# Patient Record
Sex: Male | Born: 1945
Health system: Southern US, Community
[De-identification: ages and names within clinical notes are randomized; demographics above are authoritative.]

## PROBLEM LIST (undated history)

## (undated) DIAGNOSIS — Z9889 Other specified postprocedural states: Secondary | ICD-10-CM

## (undated) DIAGNOSIS — Z860100 Personal history of colon polyps, unspecified: Secondary | ICD-10-CM

## (undated) DIAGNOSIS — K2270 Barrett's esophagus without dysplasia: Secondary | ICD-10-CM

## (undated) DIAGNOSIS — I779 Disorder of arteries and arterioles, unspecified: Secondary | ICD-10-CM

## (undated) DIAGNOSIS — I219 Acute myocardial infarction, unspecified: Secondary | ICD-10-CM

## (undated) DIAGNOSIS — T7840XA Allergy, unspecified, initial encounter: Secondary | ICD-10-CM

## (undated) DIAGNOSIS — G20A1 Parkinson's disease without dyskinesia, without mention of fluctuations: Secondary | ICD-10-CM

## (undated) DIAGNOSIS — J309 Allergic rhinitis, unspecified: Secondary | ICD-10-CM

## (undated) DIAGNOSIS — G459 Transient cerebral ischemic attack, unspecified: Secondary | ICD-10-CM

## (undated) DIAGNOSIS — I4510 Unspecified right bundle-branch block: Secondary | ICD-10-CM

## (undated) DIAGNOSIS — K5790 Diverticulosis of intestine, part unspecified, without perforation or abscess without bleeding: Secondary | ICD-10-CM

## (undated) DIAGNOSIS — R7301 Impaired fasting glucose: Secondary | ICD-10-CM

## (undated) DIAGNOSIS — R519 Headache, unspecified: Secondary | ICD-10-CM

## (undated) DIAGNOSIS — E785 Hyperlipidemia, unspecified: Secondary | ICD-10-CM

## (undated) DIAGNOSIS — I10 Essential (primary) hypertension: Secondary | ICD-10-CM

## (undated) DIAGNOSIS — I639 Cerebral infarction, unspecified: Secondary | ICD-10-CM

## (undated) DIAGNOSIS — R9430 Abnormal result of cardiovascular function study, unspecified: Secondary | ICD-10-CM

## (undated) DIAGNOSIS — K219 Gastro-esophageal reflux disease without esophagitis: Secondary | ICD-10-CM

## (undated) DIAGNOSIS — R51 Headache: Secondary | ICD-10-CM

## (undated) DIAGNOSIS — R251 Tremor, unspecified: Secondary | ICD-10-CM

## (undated) DIAGNOSIS — K602 Anal fissure, unspecified: Secondary | ICD-10-CM

## (undated) DIAGNOSIS — R002 Palpitations: Secondary | ICD-10-CM

## (undated) DIAGNOSIS — I739 Peripheral vascular disease, unspecified: Secondary | ICD-10-CM

## (undated) DIAGNOSIS — B029 Zoster without complications: Secondary | ICD-10-CM

## (undated) DIAGNOSIS — G4730 Sleep apnea, unspecified: Secondary | ICD-10-CM

## (undated) DIAGNOSIS — G20 Parkinson's disease: Secondary | ICD-10-CM

## (undated) DIAGNOSIS — H269 Unspecified cataract: Secondary | ICD-10-CM

## (undated) DIAGNOSIS — J189 Pneumonia, unspecified organism: Secondary | ICD-10-CM

## (undated) DIAGNOSIS — C61 Malignant neoplasm of prostate: Secondary | ICD-10-CM

## (undated) DIAGNOSIS — I2510 Atherosclerotic heart disease of native coronary artery without angina pectoris: Secondary | ICD-10-CM

## (undated) DIAGNOSIS — J45909 Unspecified asthma, uncomplicated: Secondary | ICD-10-CM

## (undated) DIAGNOSIS — G709 Myoneural disorder, unspecified: Secondary | ICD-10-CM

## (undated) DIAGNOSIS — R42 Dizziness and giddiness: Secondary | ICD-10-CM

## (undated) DIAGNOSIS — M1990 Unspecified osteoarthritis, unspecified site: Secondary | ICD-10-CM

## (undated) DIAGNOSIS — F419 Anxiety disorder, unspecified: Secondary | ICD-10-CM

## (undated) HISTORY — DX: Essential (primary) hypertension: I10

## (undated) HISTORY — DX: Unspecified right bundle-branch block: I45.10

## (undated) HISTORY — PX: ROTATOR CUFF REPAIR: SHX139

## (undated) HISTORY — PX: KNEE SURGERY: SHX244

## (undated) HISTORY — DX: Palpitations: R00.2

## (undated) HISTORY — PX: COLONOSCOPY: SHX174

## (undated) HISTORY — DX: Gastro-esophageal reflux disease without esophagitis: K21.9

## (undated) HISTORY — PX: BACK SURGERY: SHX140

## (undated) HISTORY — DX: Unspecified osteoarthritis, unspecified site: M19.90

## (undated) HISTORY — DX: Impaired fasting glucose: R73.01

## (undated) HISTORY — PX: UPPER GASTROINTESTINAL ENDOSCOPY: SHX188

## (undated) HISTORY — DX: Sleep apnea, unspecified: G47.30

## (undated) HISTORY — DX: Hyperlipidemia, unspecified: E78.5

## (undated) HISTORY — DX: Disorder of arteries and arterioles, unspecified: I77.9

## (undated) HISTORY — DX: Dizziness and giddiness: R42

## (undated) HISTORY — DX: Anal fissure, unspecified: K60.2

## (undated) HISTORY — PX: HERNIA REPAIR: SHX51

## (undated) HISTORY — DX: Unspecified asthma, uncomplicated: J45.909

## (undated) HISTORY — DX: Zoster without complications: B02.9

## (undated) HISTORY — DX: Tremor, unspecified: R25.1

## (undated) HISTORY — DX: Abnormal result of cardiovascular function study, unspecified: R94.30

## (undated) HISTORY — DX: Peripheral vascular disease, unspecified: I73.9

## (undated) HISTORY — DX: Diverticulosis of intestine, part unspecified, without perforation or abscess without bleeding: K57.90

## (undated) HISTORY — DX: Unspecified cataract: H26.9

## (undated) HISTORY — PX: POLYPECTOMY: SHX149

## (undated) HISTORY — DX: Cerebral infarction, unspecified: I63.9

## (undated) HISTORY — DX: Other specified postprocedural states: Z98.890

## (undated) HISTORY — DX: Personal history of colon polyps, unspecified: Z86.0100

## (undated) HISTORY — DX: Personal history of colonic polyps: Z86.010

## (undated) HISTORY — DX: Allergic rhinitis, unspecified: J30.9

## (undated) HISTORY — DX: Barrett's esophagus without dysplasia: K22.70

## (undated) HISTORY — DX: Anxiety disorder, unspecified: F41.9

## (undated) HISTORY — DX: Allergy, unspecified, initial encounter: T78.40XA

---

## 1997-12-16 ENCOUNTER — Encounter: Payer: Self-pay | Admitting: Emergency Medicine

## 1997-12-16 ENCOUNTER — Observation Stay (HOSPITAL_COMMUNITY): Admission: EM | Admit: 1997-12-16 | Discharge: 1997-12-17 | Payer: Self-pay | Admitting: Emergency Medicine

## 1999-04-02 ENCOUNTER — Ambulatory Visit (HOSPITAL_BASED_OUTPATIENT_CLINIC_OR_DEPARTMENT_OTHER): Admission: RE | Admit: 1999-04-02 | Discharge: 1999-04-02 | Payer: Self-pay | Admitting: Surgery

## 2000-01-13 HISTORY — PX: NECK SURGERY: SHX720

## 2000-07-06 ENCOUNTER — Encounter: Payer: Self-pay | Admitting: Family Medicine

## 2000-07-06 ENCOUNTER — Encounter: Admission: RE | Admit: 2000-07-06 | Discharge: 2000-07-06 | Payer: Self-pay | Admitting: Family Medicine

## 2000-12-14 ENCOUNTER — Encounter: Payer: Self-pay | Admitting: Neurosurgery

## 2000-12-17 ENCOUNTER — Encounter: Payer: Self-pay | Admitting: Neurosurgery

## 2000-12-17 ENCOUNTER — Ambulatory Visit (HOSPITAL_COMMUNITY): Admission: RE | Admit: 2000-12-17 | Discharge: 2000-12-18 | Payer: Self-pay | Admitting: Neurosurgery

## 2001-01-21 ENCOUNTER — Encounter: Payer: Self-pay | Admitting: Neurosurgery

## 2001-01-21 ENCOUNTER — Ambulatory Visit (HOSPITAL_COMMUNITY): Admission: RE | Admit: 2001-01-21 | Discharge: 2001-01-21 | Payer: Self-pay | Admitting: Neurosurgery

## 2001-02-17 ENCOUNTER — Encounter: Payer: Self-pay | Admitting: Neurosurgery

## 2001-02-17 ENCOUNTER — Ambulatory Visit (HOSPITAL_COMMUNITY): Admission: RE | Admit: 2001-02-17 | Discharge: 2001-02-17 | Payer: Self-pay | Admitting: Neurosurgery

## 2001-09-06 ENCOUNTER — Encounter: Payer: Self-pay | Admitting: Neurosurgery

## 2001-09-06 ENCOUNTER — Encounter: Admission: RE | Admit: 2001-09-06 | Discharge: 2001-09-06 | Payer: Self-pay | Admitting: Neurosurgery

## 2001-09-23 ENCOUNTER — Encounter: Payer: Self-pay | Admitting: Neurosurgery

## 2001-09-23 ENCOUNTER — Encounter: Admission: RE | Admit: 2001-09-23 | Discharge: 2001-09-23 | Payer: Self-pay | Admitting: Neurosurgery

## 2002-03-28 ENCOUNTER — Encounter
Admission: RE | Admit: 2002-03-28 | Discharge: 2002-05-10 | Payer: Self-pay | Admitting: Physical Medicine & Rehabilitation

## 2002-05-03 ENCOUNTER — Encounter
Admission: RE | Admit: 2002-05-03 | Discharge: 2002-05-03 | Payer: Self-pay | Admitting: Physical Medicine & Rehabilitation

## 2002-05-03 ENCOUNTER — Encounter: Payer: Self-pay | Admitting: Physical Medicine & Rehabilitation

## 2002-06-30 ENCOUNTER — Encounter: Payer: Self-pay | Admitting: Diagnostic Radiology

## 2002-06-30 ENCOUNTER — Encounter: Admission: RE | Admit: 2002-06-30 | Discharge: 2002-06-30 | Payer: Self-pay | Admitting: Family Medicine

## 2002-06-30 ENCOUNTER — Encounter: Payer: Self-pay | Admitting: Family Medicine

## 2002-07-14 ENCOUNTER — Encounter: Admission: RE | Admit: 2002-07-14 | Discharge: 2002-07-14 | Payer: Self-pay | Admitting: Neurosurgery

## 2002-07-14 ENCOUNTER — Encounter: Payer: Self-pay | Admitting: Neurosurgery

## 2002-10-18 ENCOUNTER — Encounter
Admission: RE | Admit: 2002-10-18 | Discharge: 2003-01-16 | Payer: Self-pay | Admitting: Physical Medicine & Rehabilitation

## 2003-02-16 ENCOUNTER — Encounter
Admission: RE | Admit: 2003-02-16 | Discharge: 2003-05-17 | Payer: Self-pay | Admitting: Physical Medicine & Rehabilitation

## 2003-05-16 ENCOUNTER — Encounter
Admission: RE | Admit: 2003-05-16 | Discharge: 2003-06-15 | Payer: Self-pay | Admitting: Physical Medicine & Rehabilitation

## 2003-06-07 ENCOUNTER — Encounter
Admission: RE | Admit: 2003-06-07 | Discharge: 2003-09-05 | Payer: Self-pay | Admitting: Physical Medicine & Rehabilitation

## 2003-11-06 ENCOUNTER — Encounter
Admission: RE | Admit: 2003-11-06 | Discharge: 2004-02-04 | Payer: Self-pay | Admitting: Physical Medicine & Rehabilitation

## 2003-11-08 ENCOUNTER — Ambulatory Visit: Payer: Self-pay | Admitting: Physical Medicine & Rehabilitation

## 2004-02-21 ENCOUNTER — Ambulatory Visit: Payer: Self-pay | Admitting: Cardiology

## 2004-05-09 ENCOUNTER — Ambulatory Visit: Payer: Self-pay | Admitting: Internal Medicine

## 2004-05-23 ENCOUNTER — Ambulatory Visit: Payer: Self-pay | Admitting: Internal Medicine

## 2004-06-25 ENCOUNTER — Encounter
Admission: RE | Admit: 2004-06-25 | Discharge: 2004-09-23 | Payer: Self-pay | Admitting: Physical Medicine & Rehabilitation

## 2004-06-25 ENCOUNTER — Ambulatory Visit: Payer: Self-pay | Admitting: Physical Medicine & Rehabilitation

## 2004-07-04 ENCOUNTER — Encounter
Admission: RE | Admit: 2004-07-04 | Discharge: 2004-07-04 | Payer: Self-pay | Admitting: Physical Medicine & Rehabilitation

## 2004-08-25 ENCOUNTER — Ambulatory Visit: Payer: Self-pay | Admitting: Physical Medicine & Rehabilitation

## 2004-10-15 ENCOUNTER — Encounter
Admission: RE | Admit: 2004-10-15 | Discharge: 2005-01-13 | Payer: Self-pay | Admitting: Physical Medicine & Rehabilitation

## 2005-06-02 ENCOUNTER — Encounter: Admission: RE | Admit: 2005-06-02 | Discharge: 2005-06-02 | Payer: Self-pay | Admitting: Neurosurgery

## 2005-08-25 ENCOUNTER — Ambulatory Visit: Payer: Self-pay | Admitting: Cardiology

## 2005-09-03 ENCOUNTER — Ambulatory Visit: Payer: Self-pay

## 2005-10-08 ENCOUNTER — Ambulatory Visit: Payer: Self-pay | Admitting: Cardiology

## 2006-09-21 ENCOUNTER — Encounter: Admission: RE | Admit: 2006-09-21 | Discharge: 2006-09-21 | Payer: Self-pay | Admitting: Neurosurgery

## 2006-10-26 ENCOUNTER — Ambulatory Visit: Payer: Self-pay | Admitting: Cardiology

## 2007-03-03 ENCOUNTER — Ambulatory Visit: Payer: Self-pay | Admitting: Physical Medicine & Rehabilitation

## 2007-03-04 ENCOUNTER — Encounter
Admission: RE | Admit: 2007-03-04 | Discharge: 2007-03-04 | Payer: Self-pay | Admitting: Physical Medicine & Rehabilitation

## 2007-03-05 ENCOUNTER — Encounter: Admission: RE | Admit: 2007-03-05 | Discharge: 2007-03-05 | Payer: Self-pay | Admitting: Neurosurgery

## 2007-06-13 ENCOUNTER — Ambulatory Visit: Payer: Self-pay | Admitting: Internal Medicine

## 2007-06-24 ENCOUNTER — Encounter: Payer: Self-pay | Admitting: Internal Medicine

## 2007-06-24 ENCOUNTER — Telehealth: Payer: Self-pay | Admitting: Internal Medicine

## 2007-06-27 ENCOUNTER — Ambulatory Visit: Payer: Self-pay | Admitting: Internal Medicine

## 2007-08-29 ENCOUNTER — Encounter: Admission: RE | Admit: 2007-08-29 | Discharge: 2007-08-29 | Payer: Self-pay | Admitting: Neurosurgery

## 2007-10-17 ENCOUNTER — Ambulatory Visit: Payer: Self-pay | Admitting: Cardiology

## 2008-05-31 ENCOUNTER — Telehealth: Payer: Self-pay | Admitting: Cardiology

## 2008-10-23 ENCOUNTER — Encounter: Payer: Self-pay | Admitting: Cardiology

## 2008-10-24 ENCOUNTER — Ambulatory Visit: Payer: Self-pay | Admitting: Cardiology

## 2009-05-01 ENCOUNTER — Encounter: Payer: Self-pay | Admitting: Cardiology

## 2009-05-02 ENCOUNTER — Ambulatory Visit: Payer: Self-pay | Admitting: Cardiology

## 2009-05-28 ENCOUNTER — Ambulatory Visit: Payer: Self-pay

## 2009-05-28 ENCOUNTER — Ambulatory Visit (HOSPITAL_COMMUNITY): Admission: RE | Admit: 2009-05-28 | Discharge: 2009-05-28 | Payer: Self-pay | Admitting: Cardiology

## 2009-05-28 ENCOUNTER — Encounter: Payer: Self-pay | Admitting: Cardiology

## 2009-05-28 ENCOUNTER — Ambulatory Visit: Payer: Self-pay | Admitting: Cardiology

## 2009-05-28 ENCOUNTER — Encounter (HOSPITAL_COMMUNITY): Admission: RE | Admit: 2009-05-28 | Discharge: 2009-05-28 | Payer: Self-pay | Admitting: Cardiology

## 2009-06-07 ENCOUNTER — Ambulatory Visit: Payer: Self-pay | Admitting: Cardiology

## 2010-02-13 NOTE — Assessment & Plan Note (Signed)
Summary: ROV/EXERTIONAL CHESTPAIN/ TIGHTNESS/ FATIGUE. DOE/ PT HAS ATE...   Visit Type:  Follow-up Primary Provider:  Kari Baars, MD  CC:  shortness of breath and chest pain.  History of Present Illness: The patient is seen for evaluation of chest discomfort and shortness of breath.  He has a family history of coronary disease.  We have evaluated him in the past.  Cardiac catheterization was done in 1996 and showed no marked abnormalities.  Nuclear exercise test was done in 2004 with no significant ischemia.  The patient's ejection fraction is normal by history but I do not have any recorded echo data.  Carotid artery Dopplers have not shown any marked disease over time.  The last one was done in 2007.  The patient's chest discomfort is relatively vague.  He does have exertional shortness of breath.  He has not had syncope or presyncope.  Current Medications (verified): 1)  Simvastatin 20 Mg Tabs (Simvastatin) .... Take One Tablet By Mouth Daily At Bedtime 2)  Aspirin 81 Mg Tbec (Aspirin) .... Take One Tablet By Mouth Daily 3)  Zyrtec Allergy 10 Mg Tabs (Cetirizine Hcl) .... Take One Tablet By Mouth Once Daily. 4)  Metoprolol Succinate 50 Mg Xr24h-Tab (Metoprolol Succinate) .... Take One Tablet By Mouth Daily 5)  Flonase 50 Mcg/act Susp (Fluticasone Propionate) .... Uad 6)  Mobic 15 Mg Tabs (Meloxicam) .... Take One Tablet By Mouth Once Daily. 7)  Fish Oil   Oil (Fish Oil) .[name].. 1200 Mg Daily 8)  Saw Palmetto   [employer]) .... Daily 9)  Flomax 0.4 Mg Caps (Tamsulosin Hcl) .[name].. 1 Cap Once Daily  Allergies: 1)  ! * Floxin or Hytrin 2)  ! Hytrin  Past History:  Past Medical History: Last updated: 05/01/2009   VERTIGO (ICD-780.4) SLEEP APNEA (ICD-780.57)...Dr. Shelle Iron PALPITATIONS, HX OF (ICD-V12.50)...benign PVCs DYSLIPIDEMIA (ICD-272.4) GERD (ICD-530.81) HYPERTENSION (ICD-401.9) Chest pain.Marland KitchenMarland KitchenMarland KitchenCoronaries normal 1996 /  nuclear..06/2002..normal...EF  56% EF   56%...nuclear  2004.[name].(no echo data as of 05/01/2009) Back surgery Carotid atery...doppler normal..[name]8/2007  Review of Systems       Patient denies fever, chills, headache, sweats, rash, change in vision, change in hearing, cough, nausea or vomiting, urinary symptoms.  All other systems are reviewed and are negative  Vital Signs:  Patient profile:   65 year old male Height:      73 inches Weight:      221 pounds BMI:     29.26 Pulse rate:   68 / minute Pulse rhythm:   regular BP sitting:   122 / 60  (left arm) Cuff size:   large  Vitals Entered By: Vikki Ports (May 02, 2009 4:06 PM)  Physical Exam  General:  patient is stable in general. Head:  head is atraumatic. Eyes:  no xanthelasma Neck:  no jugular venous distention.  No carotid bruits. Chest Wall:  no chest wall tenderness. Lungs:  lungs are clear.  Respiratory effort is not labored. Heart:  cardiac exam reveals S1 and S2.  There are no clicks or significant murmurs. Abdomen:  abdomen is soft. Msk:  no musculoskeletal deformities. Extremities:  no peripheral edema. Skin:  no skin rashes. Psych:  patient is oriented to person time and place.  Affect is normal.   Impression & Recommendations:  Problem # 1:  CHEST PAIN (ICD-786.50)  His updated medication list for this problem includes:    Aspirin 81 Mg Tbec (Aspirin) .[name]... Take one tablet by mouth daily    Metoprolol Succinate 50 Mg  Xr24h-tab (Metoprolol succinate) .[name]... Take one tablet by mouth daily The patient is having some chest pain.  EKG is done and reviewed by me.  EKG is normal.  He does have a significant family history of coronary disease.  His last exercise test was in 2004.  He has problems with low back discomfort and has had surgery on 4 occasions.  He can walk well,  but I've chosen to proceed with a dobutamine stress echo. this will allow me to obtain very good echo images along with stress data.  Orders: Dobutamine Echo (Dobutamine  Echo)  Problem # 2:  HYPERTENSION (ICD-401.9)  His updated medication list for this problem includes:    Aspirin 81 Mg Tbec (Aspirin) .[name]... Take one tablet by mouth daily    Metoprolol Succinate 50 Mg Xr24h-tab (Metoprolol succinate) .[name]... Take one tablet by mouth daily Blood pressure is under good control.  No change in therapy.  Problem # 3:  SHORTNESS OF BREATH (ICD-786.05)  His updated medication list for this problem includes:    Aspirin 81 Mg Tbec (Aspirin) .[name]... Take one tablet by mouth daily    Metoprolol Succinate 50 Mg Xr24h-tab (Metoprolol succinate) .[name]... Take one tablet by mouth daily The patient is having shortness of breath and fatigue.  A stress echo will help give information about LV function and valvular function as well as ruling out ischemia.  After this study LC and for followup.  Orders: Dobutamine Echo (Dobutamine Echo)  Problem # 4:  * LUMBAR DISC DISEASE Patient has had multiple back operations.  Overall he is stable in this regard.  Patient Instructions: 1)  Your physician has requested that you have a dobutamine echocardiogram.  For further information please visit [email or web address]  Please follow instruction sheet as given. 2)  Follow up after test

## 2010-02-13 NOTE — Miscellaneous (Signed)
Summary: GI Previsit  Clinical Lists Changes  Allergies: Added new allergy or adverse reaction of * FLOXIN OR HYTRIN Observations: Added new observation of ALLERGY REV: Done (06/13/2007 15:34) Added new observation of NKA: F (06/13/2007 15:34)

## 2010-02-13 NOTE — Progress Notes (Signed)
Summary: MEDICAL CLEARANCE  Phone Note Call from Patient   Summary of Call: [phone] TILL 330PM CELL [phone] PT WILL HAVE A SURGERY , NEEDS A MEDICAL CLEARANCE  Initial call taken by: Sydell Axon,  May 31, 2008 9:49 AM  Follow-up for Phone Call        Pt. would like to have medical clearance for a shoulder rotator cuff injury surgery to be done by Dr. Vangie Bicker MD Ortopeadic surgeon. I let pt. know Dr. Argentina Ponder will be at [facility]  office early next week. Pt. verbalized understanding.  Ollen Gross, RN, BSN  May 31, 2008 10:39 AM   Additional Follow-up for Phone Call Additional follow up Details #1::        Cleared from cardiac viewpoint.Talitha Givens, MD, [facility]  Jun 04, 2008 5:50 PM  pt aware will fax to md Meredith Staggers, RN  Jun 05, 2008 3:40 PM

## 2010-02-13 NOTE — Assessment & Plan Note (Signed)
Summary: per check out/sf   Visit Type:  Follow-up Primary Provider:  Kari Baars, MD  CC:  shortness of breath.  History of Present Illness: The patient returns today to followup the assessment of shortness of breath.  He had a stress echo study done with dobutamine on May 28, 2009.  This study was completely normal.  He did develop rate related right bundle branch block.  This is an incidental finding.  There was no sign of scar or ischemia.  I discussed this result with him today.  Current Medications (verified): 1)  Crestor 10 Mg Tabs (Rosuvastatin Calcium) .... Take 1/2 Tablet Daily 2)  Aspirin 81 Mg Tbec (Aspirin) .... Take One Tablet By Mouth Daily 3)  Zyrtec Allergy 10 Mg Tabs (Cetirizine Hcl) .... Take One Tablet By Mouth Once Daily. 4)  Metoprolol Succinate 50 Mg Xr24h-Tab (Metoprolol Succinate) .... Take One Tablet By Mouth Daily 5)  Flonase 50 Mcg/act Susp (Fluticasone Propionate) .... Uad 6)  Ibuprofen 800 Mg Tabs (Ibuprofen) .... As Needed 7)  Fish Oil   Oil (Fish Oil) .[name].. 1200 Mg Daily 8)  Saw Palmetto   [employer]) .... Daily 9)  Flomax 0.4 Mg Caps (Tamsulosin Hcl) .[name].. 1 Cap Once Daily  Allergies: 1)  ! * Floxin or Hytrin 2)  ! Hytrin  Past History:  Family History: Last updated: 10-23-2008  Father: deceased from heart attack  Past Medical History:   VERTIGO (ICD-780.4) SLEEP APNEA (ICD-780.57)...Dr. Shelle Iron PALPITATIONS, HX OF (ICD-V12.50)...benign PVCs DYSLIPIDEMIA (ICD-272.4) GERD (ICD-530.81) HYPERTENSION (ICD-401.9) Chest pain.Marland KitchenMarland KitchenMarland KitchenCoronaries normal 1996 /  nuclear..06/2002..normal...EF  56% /  stress echo.. May, 2011.... no scar or ischemia... rate related RBBB Rate related RBBB.... stress echo... may, 2011 EF  56%...nuclear  2004.[name].(no echo data as of 05/01/2009) Back surgery Carotid atery...doppler normal..[name]8/2007  Review of Systems       Patient denies fever, chills, headache, sweats, rash, change in vision, change in hearing, chest  pain, cough, nausea vomiting, urinary symptoms.  All of the systems are reviewed and are negative.  Vital Signs:  Patient profile:   65 year old male Height:      73 inches Weight:      222 pounds BMI:     29.40 Pulse rate:   68 / minute Pulse rhythm:   regular Resp:     18 per minute BP sitting:   108 / 66  (left arm) Cuff size:   large  Vitals Entered By: Vikki Ports (Jun 07, 2009 8:46 AM)  Physical Exam  General:  patient is stable today. Eyes:  no xanthelasma. Neck:  no jugular venous distention. Lungs:  lungs are clear respiratory effort is nonlabored. Heart:  cardiac exam reveals S1-S2.  No clicks or significant murmurs. Abdomen:  abdomen soft. Extremities:  no peripheral edema. Psych:  patient is oriented to person time and place.  Affect is normal.   Impression & Recommendations:  Problem # 1:  SHORTNESS OF BREATH (ICD-786.05) Stress echo is normal.  There is no evidence of cardiac ischemia.  No further workup.  He feels well in general.  Problem # 2:  PALPITATIONS, HX OF (ICD-V12.50) no significant palpitations.  Problem # 3:  * RATE RELATED RBBB Was noted with dobutamine stressed that the patient developed a rate related right bundle branch block.  This is of no clinical significance.  One-year followup.  Patient Instructions: 1)  Your physician wants you to follow-up in:  1 year.   You will receive a reminder letter  in the mail two months in advance. If you don't receive a letter, please call our office to schedule the follow-up appointment.

## 2010-02-13 NOTE — Procedures (Signed)
Summary: Colonosocpy   Colonoscopy  Procedure date:  06/27/2007  Findings:      Location:  [facility].    Procedures Next Due Date:    Colonoscopy: 06/2012  Patient Name: Joshua [name], Joshua [name] MRN:  Procedure Procedures: Colonoscopy CPT: 93235.  Personnel: Endoscopist: Wilhemina Bonito. Marina Goodell, MD.  Exam Location: Exam performed in Outpatient Clinic. Outpatient  Patient Consent: Procedure, Alternatives, Risks and Benefits discussed, consent obtained, from patient. Consent was obtained by the RN.  Indications  Surveillance of: Adenomatous Polyp(s). This is an initial surveillance exam. Initial polypectomy was performed in 2006. in May. 1-2 Polyps were found at Index Exam. Largest polyp removed was 10 to 19 mm. Prior polyp located in distal colon. Pathology of worst  polyp: tubular adenoma.  History  Current Medications: Patient is not currently taking Coumadin.  Pre-Exam Physical: Performed Jun 27, 2007. Cardio-pulmonary exam, Rectal exam, HEENT exam , Abdominal exam, Mental status exam WNL.  Comments: Pt. history reviewed/updated, physical exam performed prior to initiation of sedation?YES Exam Exam: Extent of exam reached: Cecum, extent intended: Cecum.  The cecum was identified by appendiceal orifice and IC valve. Patient position: on left side. Time to Cecum: 00:03:12. Time for Withdrawl: 00:09:19. Colon retroflexion performed. Images taken. ASA Classification: II. Tolerance: excellent.  Monitoring: Pulse and BP monitoring, Oximetry used. Supplemental O2 given.  Colon Prep Used MIRALAX for colon prep. Prep results: excellent.  Sedation Meds: Patient assessed and found to be appropriate for moderate (conscious) sedation. Fentanyl 50 mcg. given IV. Versed 7 mg. given IV.  Findings NORMAL EXAM: Cecum to Rectum.  - DIVERTICULOSIS: Descending Colon to Sigmoid Colon. ICD9: Diverticulosis, Colon: 562.10.   Assessment  Diagnoses: 562.10: Diverticulosis, Colon.  Moderate changes.  455.0: Hemorrhoids, Internal. small.   Comments: NO POLYPS SEEN Events  Unplanned Interventions: No intervention was required.  Unplanned Events: There were no complications. Plans Disposition: After procedure patient sent to recovery. After recovery patient sent home.  Scheduling/Referral: Colonoscopy, to Wilhemina Bonito. Marina Goodell, MD, IN 5 YEARS,      cc.   Wynelle Beckmann Shaw,MD   This report was created from the original endoscopy report, which was reviewed and signed by the above listed endoscopist.

## 2010-02-13 NOTE — Miscellaneous (Signed)
  Clinical Lists Changes  Problems: Added new problem of CHEST PAIN (ICD-786.50) Observations: Added new observation of PAST MED HX:   VERTIGO (ICD-780.4) SLEEP APNEA (ICD-780.57)...Dr. Shelle Iron PALPITATIONS, HX OF (ICD-V12.50)...benign PVCs DYSLIPIDEMIA (ICD-272.4) GERD (ICD-530.81) HYPERTENSION (ICD-401.9) Chest pain.Joshua KitchenMarland KitchenMarland KitchenCoronaries normal 1996 /  nuclear..06/2002..normal...EF  56% EF  56%...nuclear  2004.Joshua [name].(no echo data as of 05/01/2009) Back surgery Carotid atery...doppler normal..Joshua Kitchen8/2007  (05/01/2009 11:48) Added new observation of PRIMARY MD: Kari Baars, MD (05/01/2009 11:48)        Past History:  Past Medical History:   VERTIGO (ICD-780.4) SLEEP APNEA (ICD-780.57)...Dr. Shelle Iron PALPITATIONS, HX OF (ICD-V12.50)...benign PVCs DYSLIPIDEMIA (ICD-272.4) GERD (ICD-530.81) HYPERTENSION (ICD-401.9) Chest pain.Joshua KitchenMarland KitchenMarland KitchenCoronaries normal 1996 /  nuclear..06/2002..normal...EF  56% EF  56%...nuclear  2004.Joshua [name].(no echo data as of 05/01/2009) Back surgery Carotid atery...doppler normal..Joshua Kitchen8/2007

## 2010-02-13 NOTE — Miscellaneous (Signed)
  Clinical Lists Changes  Problems: Added new problem of HYPERTENSION (ICD-401.9) Added new problem of GERD (ICD-530.81) Added new problem of DYSLIPIDEMIA (ICD-272.4) Added new problem of PALPITATIONS, HX OF (ICD-V12.50) Added new problem of SLEEP APNEA (ICD-780.57) Added new problem of VERTIGO (ICD-780.4) Observations: Added new observation of PAST MED HX:   VERTIGO (ICD-780.4) SLEEP APNEA (ICD-780.57)...Dr. Shelle Iron PALPITATIONS, HX OF (ICD-V12.50)...benign PVCs DYSLIPIDEMIA (ICD-272.4) GERD (ICD-530.81) HYPERTENSION (ICD-401.9) Coronaries normal 1996 Back surgery   (10/23/2008 11:57) Added new observation of PRIMARY MD: Kari Baars, MD (10/23/2008 11:57)       Past History:  Past Medical History:   VERTIGO (ICD-780.4) SLEEP APNEA (ICD-780.57)...Dr. Shelle Iron PALPITATIONS, HX OF (ICD-V12.50)...benign PVCs DYSLIPIDEMIA (ICD-272.4) GERD (ICD-530.81) HYPERTENSION (ICD-401.9) Coronaries normal 1996 Back surgery

## 2010-02-13 NOTE — Miscellaneous (Signed)
Summary: miralax prep  Clinical Lists Changes  Medications: Added new medication of MIRALAX   POWD (POLYETHYLENE GLYCOL 3350) As per prep  instructions. - Signed Added new medication of DULCOLAX 5 MG  TBEC (BISACODYL) Day before procedure take 2 at 3pm and 2 at 8pm. - Signed Added new medication of METOCLOPRAMIDE HCL 10 MG  TABS (METOCLOPRAMIDE HCL) As per prep instructions. - Signed Rx of MIRALAX   POWD (POLYETHYLENE GLYCOL 3350) As per prep  instructions.;  #255gm x 0;  Signed;  Entered by: Greer Ee RN;  Authorized by: Hilarie Fredrickson MD;  Method used: Electronic Rx of DULCOLAX 5 MG  TBEC (BISACODYL) Day before procedure take 2 at 3pm and 2 at 8pm.;  #4 x 0;  Signed;  Entered by: Greer Ee RN;  Authorized by: Hilarie Fredrickson MD;  Method used: Electronic Rx of METOCLOPRAMIDE HCL 10 MG  TABS (METOCLOPRAMIDE HCL) As per prep instructions.;  #2 x 0;  Signed;  Entered by: Greer Ee RN;  Authorized by: Hilarie Fredrickson MD;  Method used: Electronic    Prescriptions: METOCLOPRAMIDE HCL 10 MG  TABS (METOCLOPRAMIDE HCL) As per prep instructions.  #2 x 0   Entered by:   Greer Ee RN   Authorized by:   Hilarie Fredrickson MD   Signed by:   Greer Ee RN on 06/24/2007   Method used:   Electronically sent to ...       [facility]  [place] [address]       [address]       [place], [place]  [address]       Ph: [phone] or [phone]       Fax: [phone]   RxID:   [id number] DULCOLAX 5 MG  TBEC (BISACODYL) Day before procedure take 2 at 3pm and 2 at 8pm.  #4 x 0   Entered by:   Greer Ee RN   Authorized by:   Hilarie Fredrickson MD   Signed by:   Greer Ee RN on 06/24/2007   Method used:   Electronically sent to ...       [facility]  [place] [address]       [address]       [place], [place]  [address]       Ph: [phone] or [phone]       Fax: [phone]   RxID:   [id number] MIRALAX   POWD (POLYETHYLENE GLYCOL 3350) As per prep  instructions.  #255gm x 0   Entered  by:   Greer Ee RN   Authorized by:   Hilarie Fredrickson MD   Signed by:   Greer Ee RN on 06/24/2007   Method used:   Electronically sent to ...       [facility]  [place] [address]       [address]       [place], [place]  [address]       Ph: [phone] or [phone]       Fax: [phone]   RxID:   [id number]

## 2010-02-13 NOTE — Progress Notes (Signed)
Summary: 6/15 colon  needs prep phone in!  Phone Note Call from Patient Call back at Work Phone [phone]   Call For: Marina Goodell Summary of Call: Pt needs Colon prep called for 6/15.[facility] [place]. chart reading to endo nurse. Initial call taken by: Verdell Face,  June 24, 2007 8:39 AM  Follow-up for Phone Call        prep sent to [facility] pharmacy in [place] and pt. notified. Follow-up by: Shelia Tellis R.N.

## 2010-03-24 ENCOUNTER — Other Ambulatory Visit: Payer: Self-pay | Admitting: Neurosurgery

## 2010-03-24 ENCOUNTER — Telehealth: Payer: Self-pay | Admitting: Cardiology

## 2010-03-24 DIAGNOSIS — M4716 Other spondylosis with myelopathy, lumbar region: Secondary | ICD-10-CM

## 2010-04-01 ENCOUNTER — Other Ambulatory Visit: Payer: Self-pay | Admitting: Neurosurgery

## 2010-04-01 ENCOUNTER — Other Ambulatory Visit: Payer: Self-pay

## 2010-04-01 ENCOUNTER — Ambulatory Visit
Admission: RE | Admit: 2010-04-01 | Discharge: 2010-04-01 | Disposition: A | Discharge status: Home / Self Care | Payer: 59 | Source: Ambulatory Visit | Attending: Neurosurgery | Admitting: Neurosurgery

## 2010-04-01 DIAGNOSIS — M549 Dorsalgia, unspecified: Secondary | ICD-10-CM

## 2010-04-01 MED ORDER — GADOBENATE DIMEGLUMINE 529 MG/ML IV SOLN
19.0000 mL | Freq: Once | INTRAVENOUS | Status: AC | PRN
  Administered 2010-04-01: 19 mL via INTRAVENOUS

## 2010-04-01 NOTE — Progress Notes (Signed)
Summary: come in ov regarly basis  Phone Note Call from Patient Call back at Home Phone [phone]   Caller: Patient Reason for Call: Talk to Nurse Summary of Call: pt wants to know does he need to come in on a really basis.  Initial call taken by: Lorne Skeens,  March 24, 2010 10:14 AM  Follow-up for Phone Call        pt aware he does need yearly f/u, appt sch for 5/9 at [facility], RN  March 24, 2010 12:21 PM

## 2010-05-19 ENCOUNTER — Encounter: Payer: Self-pay | Admitting: Cardiology

## 2010-05-21 ENCOUNTER — Encounter: Payer: Self-pay | Admitting: Cardiology

## 2010-05-21 ENCOUNTER — Ambulatory Visit (INDEPENDENT_AMBULATORY_CARE_PROVIDER_SITE_OTHER): Payer: Managed Care, Other (non HMO) | Admitting: Cardiology

## 2010-05-21 VITALS — BP 134/79 | HR 84 | Ht 73.0 in | Wt 222.0 lb

## 2010-05-21 DIAGNOSIS — R079 Chest pain, unspecified: Secondary | ICD-10-CM

## 2010-05-21 DIAGNOSIS — G4730 Sleep apnea, unspecified: Secondary | ICD-10-CM | POA: Insufficient documentation

## 2010-05-21 DIAGNOSIS — E785 Hyperlipidemia, unspecified: Secondary | ICD-10-CM | POA: Insufficient documentation

## 2010-05-21 DIAGNOSIS — K219 Gastro-esophageal reflux disease without esophagitis: Secondary | ICD-10-CM | POA: Insufficient documentation

## 2010-05-21 DIAGNOSIS — I10 Essential (primary) hypertension: Secondary | ICD-10-CM

## 2010-05-21 DIAGNOSIS — I4510 Unspecified right bundle-branch block: Secondary | ICD-10-CM | POA: Insufficient documentation

## 2010-05-21 DIAGNOSIS — IMO0002 Reserved for concepts with insufficient information to code with codable children: Secondary | ICD-10-CM | POA: Insufficient documentation

## 2010-05-21 DIAGNOSIS — R9430 Abnormal result of cardiovascular function study, unspecified: Secondary | ICD-10-CM | POA: Insufficient documentation

## 2010-05-21 DIAGNOSIS — I779 Disorder of arteries and arterioles, unspecified: Secondary | ICD-10-CM | POA: Insufficient documentation

## 2010-05-21 DIAGNOSIS — I739 Peripheral vascular disease, unspecified: Secondary | ICD-10-CM

## 2010-05-21 DIAGNOSIS — R002 Palpitations: Secondary | ICD-10-CM

## 2010-05-21 DIAGNOSIS — R42 Dizziness and giddiness: Secondary | ICD-10-CM | POA: Insufficient documentation

## 2010-05-21 NOTE — Assessment & Plan Note (Signed)
Blood pressure stable. No change in therapy. 

## 2010-05-21 NOTE — Assessment & Plan Note (Signed)
There's been no recurrent chest pain.  No further workup.

## 2010-05-21 NOTE — Assessment & Plan Note (Signed)
He is not having any significant palpitations. No further workup. 

## 2010-05-21 NOTE — Progress Notes (Signed)
HPI Patient is seen for followup of palpitations.  Over the years he's had some PVCs.  There has never been proven coronary disease.  Since he has received some treatment for his allergies he feels better than ever.  Is not having any shortness of breath chest pain or significant palpitations.  He does need back surgery again.  This will be done eventually by Dr. Channing Mutters. Allergies  Allergen Reactions  . Floxin (Ofloxacin)     Current Outpatient Prescriptions  Medication Sig Dispense Refill  . aspirin 81 MG tablet Take 81 mg by mouth daily.        [name] azelastine (ASTELIN) 137 MCG/SPRAY nasal spray 1 spray by Nasal route 2 (two) times daily. Use in each nostril as directed       . fish oil-omega-3 fatty acids 1000 MG capsule 1200 mg daily       . ibuprofen (ADVIL,MOTRIN) 800 MG tablet As needed       . levocetirizine (XYZAL) 5 MG tablet Take 5 mg by mouth every evening.        . metoprolol (TOPROL-XL) 50 MG 24 hr tablet Take 50 mg by mouth daily.        . mometasone (NASONEX) 50 MCG/ACT nasal spray 2 sprays by Nasal route daily.        . montelukast (SINGULAIR) 10 MG tablet Take 10 mg by mouth at bedtime.        . rosuvastatin (CRESTOR) 10 MG tablet Take 1/2 tablet daily       . saw palmetto 160 MG capsule Take 160 mg by mouth 2 (two) times daily.        . Tamsulosin HCl (FLOMAX) 0.4 MG CAPS Take 0.4 mg by mouth daily.        [name] DISCONTD: cetirizine (ZYRTEC) 10 MG tablet Take 10 mg by mouth daily.        [name] DISCONTD: fluticasone (FLONASE) 50 MCG/ACT nasal spray UAD       . DISCONTD: Saw Palmetto POWD by Does not apply route daily.          History   Social History  . Marital Status: Married    Spouse Name: N/A    Number of Children: N/A  . Years of Education: N/A   Occupational History  . Not on file.   Social History Main Topics  . Smoking status: Never Smoker   . Smokeless tobacco: Not on file  . Alcohol Use: No  . Drug Use: No  . Sexually Active: Not on file   Other Topics  Concern  . Not on file   Social History Narrative  . No narrative on file    Family History  Problem Relation Age of Onset  . Heart attack Father     Past Medical History  Diagnosis Date  . Vertigo   . Sleep apnea   . Palpitations   . Dyslipidemia   . GERD (gastroesophageal reflux disease)   . HTN (hypertension)   . Chest pain     Coronaries normal 1996 /  nuclear..06/2002..normal...EF  56% /  stress echo.. May, 2011.... no  scar or ischemia... rate related RBBB  . RBBB (right bundle branch block)     rate related  . Hx of colonoscopy   . Ejection fraction     EF 60%, stress echo, May, 2011  . Carotid artery disease     Carotid Doppler normal August, 2007    Past Surgical History  Procedure Date  .  Back surgery     ROS  Patient denies fever, chills, headache, sweats, rash, change in vision, change in hearing, chest pain, cough, nausea vomiting, urinary symptoms.  All other systems are reviewed and are negative.  PHYSICAL EXAM Patient is oriented to person time and place.  Affect is normal.  There is no xanthelasma.  Lungs are clear.  Respiratory effort is not labored.  Cardiac exam reveals S1 and S2.  No clicks or significant murmurs.  There is no jugular venous distention.  Abdomen is soft.  There is no peripheral edema. Filed Vitals:   05/21/10 1536  BP: 134/79  Pulse: 84  Height: 6\' 1"  (1.854 m)  Weight: 222 lb (100.699 kg)    EKG   EKG is done and reviewed by me.  There is normal sinus rhythm.  There is one PVC noted.  ASSESSMENT & PLAN

## 2010-05-21 NOTE — Patient Instructions (Signed)
Your physician wants you to follow-up in: 1 year. You will receive a reminder letter in the mail two months in advance. If you don't receive a letter, please call our office to schedule the follow-up appointment.  

## 2010-05-27 NOTE — Assessment & Plan Note (Signed)
[facility]                            CARDIOLOGY OFFICE NOTE   RIVEN, MABILE                       MRN:          [id number]  DATE:10/17/2007                            DOB:          Jan 27, 1945    CHIEF COMPLAINT:  Followup of palpitations and chest pain and  hyperlipidemia.   The patient had had some chest discomfort in the past and normal  troponin and slight CPK elevation.  Ultimately, catheterization was  done, and he had normal coronaries.  He is not having any chest pain,  and he is doing well.  The patient has a history of palpitations.  He  has had scattered PVCs.  He has been on a beta-blocker and he is stable.  He is not currently having any significant PVCs, and he is going about  full activity.  He also has a history of hyperlipidemia.  This is  followed and treated by Dr. Clelia Croft.   ALLERGIES:  Question HYTRIN and question FLOXIN.   MEDICATIONS:  1. Zocor 20.  2. Aspirin 81.  3. Prevacid.  4. Zyrtec.  5. Eye drops.  6. Flonase.  7. Metoprolol 50.  8. Ibuprofen as needed.   OTHER MEDICAL PROBLEMS:  See the list below.   REVIEW OF SYSTEMS:  He has no GI or GU symptoms.  He is fully active.  He does have back pain that is chronic.  He had had some additional back  surgery.  This went well, but he still has some symptoms from some other  areas.  Otherwise, review of systems is negative.   PHYSICAL EXAMINATION:  VITAL SIGNS:  Blood pressure 130/80.  Pulse 65.  GENERAL:  The patient is oriented to person, time, and place.  Affect is  normal.  HEENT:  Reveals no xanthelasma.  He has normal extraocular motions.  NECK:  There are no carotid bruits.  There is no jugular venous  distention.  LUNGS:  Clear.  Respiratory effort is not labored.  CARDIAC:  Reveals an S1 with an S2.  There are no clicks or significant  murmurs.  ABDOMEN:  Soft.  EXTREMITIES:  The patient has no significant peripheral edema.   EKG reveals normal  sinus rhythm and no significant changes.   PROBLEMS:  1. History of normal coronaries by cath in 1996.  2. Hypertension, treated.  3. History of gastroesophageal reflux disease, stable.  4. History of back and neck surgery with continued symptoms that are      being managed by his other physicians.  5. Hyperlipidemia, treated and managed by Dr. Clelia Croft.  6. Palpitations.  These are benign PVCs, and he is stable and he needs      no further workup.  7. History of sleep apnea.  This has been managed through Dr. Marcelyn Bruins in the past.  8. History of vertigo that is stable.  9. History of hernia surgery in the past.   Mr. Keehan is stable.  He needs no other testing today.  I will see him  in 1 year in followup.  Luis Abed, MD, [facility]  Electronically Signed    JDK/MedQ  DD: 10/17/2007  DT: 10/17/2007  Job #: [id number]   cc:   Kari Baars, M.D.

## 2010-05-27 NOTE — Assessment & Plan Note (Signed)
[facility]                            CARDIOLOGY OFFICE NOTE   DRAYTON, TIEU                       MRN:          [id number]  DATE:10/26/2006                            DOB:          [birth year]    Joshua [name] is doing well.  He is not having any significant palpitations.  He was walking in a parking lot area and there was an unmarked area that  caused him to fall and hit his chest.  He did not have syncope or pre-  syncope.  He is stable but has some soreness in his chest.  He is not  having much in the way of palpitations.  He is not having the rushing  sensation to his head that he has had before.  He tells me that he had  lifeline assessment elsewhere, and that he had no major problems.  He is  tolerating the beta blocker dose that we currently have him on.   We know that his coronaries are normal.  We have followed his blood  pressure.   PAST MEDICAL HISTORY:   ALLERGIES:  Question HYTRIN and question FLOXIN.   MEDICATIONS:  1. Zocor.  2. Aspirin.  3. Prevacid.  4. Ibuprofen.  5. Zyrtec.  6. Eye drops.  7. Flonase.  8. Metoprolol 50.   OTHER MEDICAL PROBLEMS:  See the list below.   REVIEW OF SYSTEMS:  He feels well and is doing well.  He has had no  chest pain.   PHYSICAL EXAM:  Weight is 229 pounds, which is stable.  Blood pressure  134/80 with a pulse of 75.  The patient is oriented to person, time, and place.  Affect is normal.  HEENT:  No xanthelasma.  He has normal extraocular motions.  There are  no carotid bruits.  There is no jugular venous distension.  LUNGS:  Clear.  Respiratory effort is not labored.  CARDIAC:  Reveals an S1 with an S2.  There are no clicks or significant  murmurs.  ABDOMEN:  Soft.  There are normal bowel sounds.  He has no peripheral  edema.   EKG reveals some scattered PVCs and he has sinus rhythm.   PROBLEMS:  1. Normal coronaries in 1996.  2. Hypertension, treated.  3. Gastroesophageal  reflux disease.  4. History of back and neck surgery.  5. Hyperlipidemia, treated.  6. Palpitations.  He does have scattered premature ventricular      contractions.  He is stable on the current dose of beta blocker.  7. Possible sleep apnea, and this has been managed through Dr. Marcelyn Bruins.  I do not know if there has been any further evaluation      since last year.  8. Vertigo by history, stable.  9. Episodes of head rush.  He is not having this any more.  10.History of hernia surgery.   Joshua [name] is stable.  I can see him back in 1 year.     Luis Abed, MD, [facility]  Electronically Signed    JDK/MedQ  DD: 10/26/2006  DT: 10/27/2006  Job #: [id number]

## 2010-05-27 NOTE — Assessment & Plan Note (Signed)
Joshua [name] follows up today.  I last saw him on August 26, 2004.  He has  a history of cervical post laminectomy syndrome, cervical facet  syndrome, as well as lumbar stenosis with radiculitis.  He had done well  in terms of his neck, however, he has developed increasing back pain  over the last 6 months or so.  He has had a right microdiskectomy L5-S1  on September 24, 1994.  He has developed increased right groin pain as  well as right leg weakness, particularly over the last few weeks.  He is  scheduled for a repeat MRI of the lumbar spine tomorrow and is following  with Dr. Channing Mutters afterwards.   His latest MRI was done on September 21, 2006.  It demonstrated no  significant findings at L1-2, diffuse bulging L2-3, and a shallow broad-  based foraminal protrusion on the right but no neural compression at L3-  4.  There is a paracentral disk protrusion with some impingement on the  right L4 nerve root but not on L3.  L4-5 showed a small central disk  protrusion.  No foraminal stenosis.  L5-S1 showed some epidural fibrosis  mainly around the left S1 nerve root.   He has had no new medical issues .   He has seen Dr. Nickola Major back in December, who did what sounds like  a 2-level transforaminal injection at L2-3 and L3-4 on the right which  helped for a few days.  He has had some re-aggravation of symptoms after  reaching upward for something and since that time he has had continued  pain in the right side of the low back.  He has intermittent lower  extremity numbness on the right side mainly in the thigh and knee but  sometimes to the right medial ankle.   His pain right now is 5/10 but it gets as high as an 8.  It interferes  with walking, bending, standing.  His sleep is fair.  He continues to  work 40 hours a week.   SOCIAL HISTORY:  He is married.   FAMILY HISTORY:  Heart disease, high blood pressure.   PHYSICAL EXAMINATION:  VITAL SIGNS:  Blood pressure 139/83, pulse 72,  respirations 18, O2 sat __________% on room air.  MENTAL STATUS: Mood and affect appropriate.  MUSCULOSKELETAL:  Neck with decreased left lateral bending and rotation  to the left, otherwise 50% forward flexion extension.  He has full  strength bilateral deltoid, biceps, triceps, grip.  His manual muscle  testing of the lower extremities is 4 at the hip flexor on the right, 4  at the knee extensor on the right, and 4 on the ankle dorsiflexor on the  right.  Plantar reflexes 5.  On the left side, he has 5 at hip flexor, 5  at the knee extensor, and 4 at the ankle dorsiflexor which is chronic.  His sensation is reduced to pinprick, right L3 and L4 dermatomes as well  as the left S1 dermatome.  His deep tendon reflexes:  Achilles left is  0.  Achilles right is 1.  Patella left is 2.  Patella right is 1.  Lower  extremity range of motion is normal with the exception of pain in the  groin and back area with hip external rotation on the right side only.  Upper extremity range of motion is normal.  There is no tenderness on  palpation over the joints in the upper and lower extremities.  Normal  range  of motion.  Coordination is intact.  The patient is unable to step  on a stool to the exam table going up with his right leg but he can do  it with his left leg.  He is unable to heel walk on the right side and  toe walk on the left side.   IMPRESSION:  1. Right L3-4 radiculopathy causing weakness and sensory changes in      the L3 and L4 dermatomes on the right side.  2. Chronic left S1 radiculopathy.   Discussed his physical exam findings as well as his last MRI findings.  We discussed his exercise program and I suggested he make some  modifications to it.  In terms of pain, he is happy with his ibuprofen and he continues to  take 800 mg t.i.d.  Discussed injections, possibility of doing a repeat epidural and would  likely do the transluminal level at L3-4.  Given that he has some quad  weakness, he may well end up with having  surgery if this does not improve.   He will follow up with Dr. Channing Mutters after his MRI tomorrow.   I will see the patient back.  If he does not undergo surgery, he will  continue conservative care.  We discussed other pain medicines for his  radicular symptoms but he states he has tried Neurontin before and it  really did not work and he is not interested in taking any other  medicines.      Joshua [name], M.D.  Electronically Signed     AEK/MedQ  D:  03/04/2007 16:07:31  T:  03/05/2007 16:19:48  Job #:  [id number]   cc:   Payton Doughty, M.D.  Fax: [phone]

## 2010-05-30 NOTE — Op Note (Signed)
[place]  Patient:    Joshua [name], Joshua [name] Visit Number: [id number] MRN: [id number]          Service Type: DSU Location: [id number] Attending Physician:  Mariam Dollar Dictated by:   Garlon Hatchet., M.D. Proc. Date: 12/17/00 Admit Date:  12/17/2000 Discharge Date: 12/18/2000                             Operative Report  PREOPERATIVE DIAGNOSIS:  C6 radiculopathy, right, from a large ruptured disk at C5-6.  PROCEDURE:  Anterior cervical diskectomy and fusion of C5-6 using a ______ wedge and a 23 mm ______ plate, four 13 mm variable angle screws.  SURGEON:  Garlon Hatchet., M.D.  ASSISTANT:  Altamease Oiler, M.D.  ANESTHESIA:  General endotracheal.  INDICATIONS FOR PROCEDURE:  The patient is a very pleasant 65 year old gentleman with long-standing neck and right arm pain with weakness of his left triceps that preoperatively showed a very large ruptured disk compressing the right C6 nerve root.  The patient was extensively counseled of the risks and benefits of treatment after failure of conservative therapy, understands the risks and benefits of surgery, decided to proceed for the surgery.  DESCRIPTION OF PROCEDURE:  The patient was brought to the OR, induced under general anesthesia.  Positioned supine under a shoulder roll with the neck in slight extension.  The right side of his neck was prepped in the usual sterile fashion.  I next used a localizing needle to find the C5-6 disk space.  A curvilinear incision was made just off the midline.  The posterior sternocleidomastoid with the 10 blade scalpel superficial was entered.  The platysma was dissected out and divided longitudinally.  The sternocleidomastoid and ______ was drilled down to the prevertebral fascia. The prevertebral fascia was dissected with Kitners.  Intraoperative x-ray confirmed the localization of the C5-6 disk space.  The longus ______ was reflected laterally, self-retaining  retractor was placed.  Annulotomy was done with an 11 blade scalpel.  Pituitary rongeurs were used on the anterior margin of the annulus.  High speed drill was used to drill down the posterior longitudinal ligament.  Then the operating microscope was draped, brought into the field.  Under microscopic illumination the interspace was drilled down using 2 mm Kerrison punch.  The thecal sac was decompressed out to the left side of C6, and attention was taken to decompress to the right side.  The ligament was removed in a piecemeal fashion.  Several fragments were removed from the inferior aspect of the disk space against the pedicle, and then attention was taken out to the foramen.  The 1 and 2 mm Kerrison punches were used to decompress the right C6 nerve root.  There was a small amount of clear fluid appreciated.  This was felt to be CSF.  It was further explored, and a small dural wrench was appreciated.  The remainder of the nerve root was radically decompressed around this, and explored with angled nerve hook, and noted to have no further compression.  No further disk fragments were appreciated.  The wound was copiously irrigated, and meticulous hemostasis was maintained.  ______ was overlaid on top of the proximal aspect of the tear at the right C6 nerve root.  Then Gelfoam was overlaid, and a 6 mm patellar wedge was inserted after antibiotic had been placed.  Then the 23 mm ______ was sized, selected, and four 13  mm variable angle screws were inserted after being drilled and tapped.  Postoperative x-ray confirmed good localization of plate, screws, and bone graft.  The wound was copiously irrigated.  Meticulous hemostasis was maintained.  The platysma was closed with 3-0 interrupted Vicryl.  The skin was closed with running 4-0 subcuticular.  Benzoin and Steri-Strips were applied.  The patient went to the recovery room in stable condition.  At the end of the case, sponge, needle, and  instrument counts were correct. Dictated by:   Garlon Hatchet., M.D. Attending Physician:  Mariam Dollar DD:  12/17/00 TD:  12/18/00 Job: [id number]

## 2010-05-30 NOTE — Assessment & Plan Note (Signed)
HISTORY OF PRESENT ILLNESS:  Joshua [name] returns today, last seen by me on Jun 07, 2003.  He was increased on his Neurontin 300 p.o. b.i.d. and then to  t.i.d.  However, he did not tolerate the t.i.d. dosing, it made him too  groggy.  He has had anterior thigh burning sensations.  This has improved  somewhat since being on the Neurontin and going through physical therapy.  In terms of his neck pain, he has continued to do well status post his  radiofrequency ablation on January 09, 2003.   CURRENT MEDICATIONS:  1. Prevacid.  2. Zocor.  3. Aspirin.  4. Metoprolol.  5. Ibuprofen 200 b.i.d.  6. Neurontin 300 b.i.d.   PAIN SCALE:  A 5-6/10 on average, particularly in the left foot and ankle  region.  Has burning-type discomfort.  Pain improves with rest, therapy and  medication.  Made worse with bending and working.   SOCIAL HISTORY:  Continues to work full time.  Married.   REVIEW OF SYSTEMS:  Positive for numbness in his right foot and  palpitations.   PHYSICAL EXAMINATION:  VITAL SIGNS:  Blood pressure 131/79, pulse 64,  respirations 20, O2 saturation 98% on room air.  NECK:  Good range of motion except bending and turning his head towards the  left side causing some pain.  EXTREMITIES:  He has no pain to palpation of his cervical upper back region  and even lower back region and hip region.  He has forward flexion, about  75% range.  Extension is 0-25%.  States that he does not like to aggravate  his pain by doing this.  Deep tendon reflexes are normal in bilateral upper  and lower extremities.  No pain on palpation bilateral upper and lower  extremities.  He has full range of motion in bilateral upper and lower  extremities.   IMPRESSION:  1. Lumbar post laminectomy syndrome.  Has had left L5 laminectomy x3 and     also laminectomy on the right side.  2. History of cervical facet arthropathy, improved after radiofrequency     ablation.   PLAN:  1. Continue Neurontin 30  b.i.d.  2. Consider right L5 transforaminal epidural steroid injection.  He would     like to see how he does just on     medication at this point.  3. I will see him back in about four months to follow up on his progress.      Erick Colace, M.D.   AEK/MedQ  D:  07/09/2003 17:50:27  T:  07/09/2003 20:44:23  Job #:  [id number]   cc:   Joshua [name]  [address]  [place]  [place] [address]  Fax: [phone]   Joshua [name], M.D.  [address]  [place]  [place] [address]  Fax: [phone]

## 2010-05-30 NOTE — Assessment & Plan Note (Signed)
MEDICAL RECORD NUMBER:  [id number].   The patient last seen by me October 2005. Has complained of increasing foot  numbness bilaterally. Also feels like his legs get more fatigued than they  used to. The patient also complains of increasing knee pain and has seen Dr.  Otelia Sergeant for that. Has been to some physical therapy for that.   PAST MEDICAL HISTORY:  C5-C6 ACDF as well as cervical facet radiofrequency  for chronic cervical pain. He has also had lumbar laminectomy done by one of  the surgeons at [facility] back in the 1990s. He has done  relatively well on Neurontin. Had about a year's supply from me and now  follows up. His last MRI of his lumbar spine is dated July 06, 2000. He has  had a lumbar L4-5 epidural June 30, 2002 which he states felt like it made  him worse. He has also had right S1 nerve root injection and right L5 nerve  root block in 2003.   He has had EMG sometime in 2005 at [facility].   His pain level he rates as 6/10 and pain interference scores with general  activity, relationship with others, and enjoyment of life also 6/10. He also  indicates pain bilaterally over the feet and left calf as well as right knee  pain which is different in character from the other pain. He states he can  walk 20 minutes, climb steps, and drive. 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 He complains of weakness and fatigue in  his legs as noted above.   No bowel or bladder dysfunction.   SOCIAL HISTORY:  Married. Continues to work full time as noted.   PHYSICAL EXAMINATION:  VITAL SIGNS:  Blood pressure 157/90, rechecked 150/93  and was instructed to follow up with PCP regarding elevated results. Pulse  72, respirations 15, O2 saturation 97% on room air.  GENERAL:  No acute distress. Mood and affect appropriate.   Gait:  He has a chronic left foot drop which is unchanged compared to  previous. He has 4-/5 strength in left  ankle dorsi flexion but otherwise 5/5  in bilateral hip flexion, knee extension, and right ankle dorsi flexion as  well as bilateral plantar flexion. He has mildly reduced sensation in the  left S1, S2, L4 dermatomes.   His deep tendon reflexes are 2+ in bilateral ankles and 3+ bilateral knees  and bilateral upper extremities.   IMPRESSION:  1.  Lumbar stenosis with what sounds like neurogenic claudication symptoms.      Sounds like it has progressed since I last saw him about nine months.  2.  Cervical stenosis causing brisk deep tendon reflexes bilateral uppers.      He has no radicular symptoms at the current time.  3.  Right knee pain. I was told that he had OA per Dr. Otelia Sergeant, going through      some physical therapy for this.   PLAN:  Will repeat lumbar MRI. His last one was dated July 06, 2000:  Mild  disk bulging L2-3; small central disk protrusion L3-4 without significant  stenosis, L4-5; small annular rent; mild central stenosis due to posterior  lig flavum hypertrophy at L5-S1. He had some postoperative laminectomy  changes, chronic degenerative disks, and biforaminal stenosis.   I will see him back after his MRI is done. Will have it done with and  without contrast. Consider having his therapist treat  his low back pain as  well. Consider epidural although will need to get further history of which  particular epidural made him feel worse.   Consider Medrol dose pack.   Consider neurosurgical referral.       AEK/MedQ  D:  06/26/2004 14:35:27  T:  06/26/2004 15:34:48  Job #:  [id number]   cc:   Donalee Citrin, M.D.  [address]  [place] [address]  Fax: [phone]   Kerrin Champagne, M.D.  [address]  [place]  [place] [address]  Fax: [phone]

## 2010-05-30 NOTE — Op Note (Signed)
NAME:  Joshua [name], Joshua [name]                          ACCOUNT NO.:  [id number]   MEDICAL RECORD NO.:  [id number]                   PATIENT TYPE:  REC   LOCATION:  [facility]                                  FACILITY:  [facility]   PHYSICIAN:  Celene Kras, MD                     DATE OF BIRTH:  12/21/1945   DATE OF PROCEDURE:  11/21/2002  DATE OF DISCHARGE:                                 OPERATIVE REPORT   INDICATIONS FOR PROCEDURE:  The [facility].  I reviewed on the history form a 14-point review of systems.  The  patient still remains on non-narcotic medication alternatives, and is doing  quite well.  He stated that he had 75% diminution of pain perception after a  cervical facet medial branch injection, but that it is starting to  recrudesce.  I will go ahead and proceed with the second, prior to  consideration of radiofrequency neural ablation.  Should he heave a positive  provocative block, and again have a decline in functional indices, with the  return of symptoms, I think this is our best direction.  I reviewed the  risks, complications and options of this procedure, and he understands and  wishes to proceed.  I do not believe that further imaging or diagnostics are warranted at this  time.  He is a very active individual and wants to remain so.  I will review  Ultracet and other multi-modality techniques.   PLAN:  I will refer him back to Dr. Erick Colace.  Should Dr.  Wynn Banker feel that he is an RF candidate, he will refer back for the  considerations of this procedure.   PHYSICAL EXAMINATION:  Diffuse paracervical suprascapular myofascial  discomfort, levator scapular myofascial pain as well.  A positive cervical  pseudo-compression test, left greater than right.  No neurological findings  in motor and sensory and reflexes.   IMPRESSION:  1. Cervicalgia.  2. Degenerative spine disease of the cervical spine.  3. Cervical facet  syndrome.   PROCEDURE:  A cervical facet injection at C4, C5, C6 and C7, left-sided,  with independent needle access points.  He has consented.  Contributory  innervation will be addressed as well.   DESCRIPTION OF PROCEDURE:  The patient is taken to the fluoroscopy suite and  placed in the supine position.  The neck was prepped and draped in the usual  fashion.  Using a #25 gauge needle, I advanced to the cervical facet, medial  branch at C4, C5, C6 and C7 at independent needle access points, confirming  placement in multiple fluoroscopic positions.  I then injected 0.5 mL of  lidocaine 1% and MPF at each level, with a total of 40 mg of Aristocort in a  divided dose.  He tolerated this procedure well.  No complications with the procedure.  He  was  appropriate to recovery.   DISPOSITION:  Discharge instructions were given.  We will see him in  followup.                                               Celene Kras, MD    HH/MEDQ  D:  11/21/2002  T:  11/21/2002  Job:  [id number]

## 2010-05-30 NOTE — Assessment & Plan Note (Signed)
REASON FOR VISIT:  Joshua [name] returns after I last saw him December 22, 2002.  Since that time he has had cervical facet radiofrequency neuroablation C4-5-  6.  His pain level has been about 2/10.  He has taken Ultracet perhaps one-  half tablet twice a day at most and sometimes just taking some Advil.  He is  overall quite pleased.  He says the only motion that bothers him when he  does it for a prolonged period of time is down and to the left.   CURRENT MEDICATIONS:  Include metoprolol, Zocor, Ultracet as noted above,  aspirin 81 mg a day, and Protonix 40 a day.   REVIEW OF SYSTEMS:  Weakness, abnormal heart rhythm, reflux heartburn.   SOCIAL:  Married, works full-time.   EXAMINATION:  Blood pressure 141/85, pulse 81, O2 saturation 96% on room  air.   In general, no acute distress.  Mood and affect appropriate.  Gait is  normal.  Neck has full forward flexion, 75% extension, 75% lateral rotation,  and 25% lateral bending.  He has no pain down the arms with lateral bending  or extension.  He has full muscle strength bilateral deltoid, biceps,  triceps, grip, as well as wrist extensor.  Lower extremity strength is full.  Range of motion is full bilateral upper and lower extremities.   Back is without tenderness to palpation.   IMPRESSION:  Cervicalgia due to cervical facet arthropathy, much improved  after cervical radiofrequency ablation C4-5-6.  He is one-and-a-half months  out.   PLAN:  1. He will follow up with me on a p.r.n. basis.  If his neck pain recurs     will likely need repeat radiofrequency.  2. Pain management-wise, Ultracet one-half tablet p.o. b.i.d. p.r.n. and if     he runs out of these try some Advil prior to refilling.      Erick Colace, M.D.   AEK/MedQ  D:  02/20/2003 16:10:96  T:  02/20/2003 10:52:45  Job #:  [id number]   cc:   Donalee Citrin, M.D.  [address]  [place]  [place] [address]  Fax: [phone]

## 2010-05-30 NOTE — Assessment & Plan Note (Signed)
MEDICAL RECORD NUMBER:  [id number].   DATE OF BIRTH:  1945/10/21.   Joshua [name] returns today, last seen by me May 08, 2003. We started him on  some Neurontin 100 mg t.i.d. This has helped somewhat with the anterior  thigh burning sensation. Sent him to physical therapy. Has been doing with  this in general. Still has some thigh burning pain, some problems doing left  leg stretches, that is hamstring stretches, feels like his left foot drags  with fatigue after repeated exercises. He does work on his stretching  exercises about 35 minutes every morning.   CURRENT MEDICATIONS:  1. Prevacid.  2. Zocor.  3. Aspirin.  4. Metoprolol.  5. Neurontin 100 mg p.o. t.i.d.  6. Ibuprofen 200 mg p.o. b.i.d.   ALLERGIES:  HYTRIN and FLOXIN.   Pain level is 5/10 on average mainly in his thighs and his low back as well  as right lateral leg and lateral foot. Neck pain is minimal at this point.   His pain improves with rest and therapy, made worse by walking, bending,  sitting, working. Continues to work full time.   REVIEW OF SYSTEMS:  As noted above, no arm weakness.   PHYSICAL EXAMINATION:  VITAL SIGNS:  Blood pressure 145/94, pulse 67, O2  saturation 97% on room air.  NECK:  Decreased range of motion, leftward gaze, approximately 50%,  otherwise full range of motion of the neck.  EXTREMITIES:  Full strength in bilateral upper extremities. Normal sensation  in bilateral upper extremities. Normal deep tendon reflexes, bilateral upper  extremities. Lower extremity has decreased L4 and S1 sensation on the left  side. He has a decreased left S1 deep tendon reflexes and a diminished right  S1 reflux, the patella 2+ bilaterally. He has decreased peroneus longus  strength, right side 4/5 grade. Range of motion is full at the hips, knees,  and ankles.   IMPRESSION:  1. History of cervical facet arthropathy, improved after radiofrequency     ablation January 09, 2003.  2. Lumbar post laminectomy  syndrome, left L5 laminectomy x3, 2 at L5 on the     left and 1 on L5 on the right.   PLAN:  1. Continue physical therapy.  2. Increased Neurontin to 300 b.i.d. for 2 days, then increase to 300 t.i.d.     thereafter.  3. Consider L5 transforaminal epidural steroid injection. This was last done     Jun 06, 2001.  4. I will see him back in one month.      Erick Colace, M.D.   AEK/MedQ  D:  06/07/2003 14:05:40  T:  06/07/2003 15:37:42  Job #:  [id number]   cc:   Donalee Citrin, M.D.  [address]  [place]  [place] [address]  Fax: [phone]   Santiago Glad  [address]  [place]  [place] [address]  Fax: [phone]

## 2010-05-30 NOTE — Assessment & Plan Note (Signed)
MEDICAL RECORD NUMBER:  [id number]   DATE OF BIRTH:  26-Jun-1945   HISTORY OF PRESENT ILLNESS:  [name] was last seen by me on June 26, 2004.  He had complaints of increasing foot numbness bilaterally and  therefore we repeated MRI of the lumbar spine.  He has a past medical  history significant for C5-C6 ACDF for cervical disk.  He has a history of  lumbar laminectomy by [facility] in the late 1990s.   We prescribed a Medrol Dosepak and his lower extremity pain has diminished  since that time.  He has no bowel or bladder dysfunction.   SOCIAL HISTORY:  Married.  Continues to work full time.   His pain interference score is  relationship with other people 7, enjoyment  of life 5.  Sleep is good.  Relief from meds is fair.   He can walk 30 minutes straight, climb steps and drive.   CURRENT MEDICATIONS:  1.  Neurontin 300 mg t.i.d.  2.  Ibuprofen 200 mg p.r.n.  3.  In addition, he is on Prevacid, Zocor, aspirin and metoprolol.   PHYSICAL EXAMINATION:  VITAL SIGNS:  Blood pressure 143/96, pulse 76,  respiratory rate 16, O2 SAT 98% on room air.  GENERAL:  In no acute distress.  Mood and affect appropriate.  BACK:  His back has mild tenderness to palpation.  NEUROMUSCULAR:  Range of motion is 75% forward flexion, 25% extension.  Extension causes some lower extremity pain, more proximally than distally.  Physical exam shows a chronic left foot drop with no evidence of foot slap.  His remainder of the lower extremity strength is 5/5.  His range of motion  is full.  His deep tendon reflexes are 2+ in bilateral ankles, 3+ in  bilateral knees and bilateral upper extremities.   MRI FINDINGS:  I reviewed his MRI findings with him.  There are really no  significant changes in his MRI of the lumbar spine compared with August 20, 2002.  He does, however, continue to demonstrate a disk osteophyte complex  into the left L5-S1 foramen causing severe foraminal narrowing,  selecting  descending S1 nerve root, and also moderately severe right foraminal  narrowing.   IMPRESSION:  1.  Lumbar stenosis with some neurogenic claudication symptoms mainly at L5-      S1 that have improved with corticosteroid burst and taper.  2.  Cervical stenosis without radicular symptoms at this time.  3.  Right knee osteoarthritis, seeing Dr. Otelia Sergeant for this.   PLAN:  We will go ahead and, if he has progression despite this, we will ask  for a surgical opinion.       AEK/MedQ  D:  07/28/2004 14:15:43  T:  07/29/2004 07:27:31  Job #:  [id number]   cc:   Donalee Citrin, M.D.  [address]  [place] [address]  Fax: [phone]   Kerrin Champagne, M.D.  [address]  [place]  [place] [address]  Fax: [phone]

## 2010-05-30 NOTE — Assessment & Plan Note (Signed)
DATE OF BIRTH:  1945-06-07   MEDICAL RECORD NUMBER:  [id number]   HISTORY:  [name] has a history of cervical post laminectomy  syndrome, cervical facet syndrome, lumbar stenosis with radiculitis, and has  had a recent exacerbation of radicular symptoms but has had some  improvements since a Medrol Dosepak which was called in in June. He feels  like the effect of this has been slowly wearing off but he is reluctant to  try another one. He has continued to work full-time. He is married. He has  no change in his social situation. His pain is in the 5-6/10 range, mainly  in the right hip, right knee, as well as left lateral leg. He continues to  drive and climb steps.   INTERCURRENT MEDICAL CONDITIONS:  None new.   EXAMINATION:  Blood pressure 136/81, pulse 75, respirations 16, O2  saturation 96% room air.   Sensory exam has decreased left L4 dermatome, decreased right L5 dermatome  to pinprick. FABER test has some hip tightness on the right side but also  some PSIS referable pain, but also has some foot tingling with this  maneuver. His back has no significant tenderness to palpation otherwise. He  has full strength in the right lower extremity but in the left lower  extremity he has 4- ankle dorsiflexor strength.   IMPRESSION:  1.  Lumbar stenosis with neurogenic claudication symptoms L5-S1 which have      generally improved after corticosteroid taper. Given that he is still      doing quite well, able to maintain his activity status, will not repeat      a corticosteroid taper. Have also gone over potential other treatment      options including lumbar epidural steroid injections, which he is      reluctant to try as he states he had a bad experience with one in the      past.  2.  Cervical post laminectomy syndrome with cervical facet syndrome,      currently asymptomatic.   PLAN:  1.  Continue Neurontin 300 p.o. b.i.d. He has had sedation when he has tried  to go up to t.i.d. Wound consider Lyrica trial for this.  2.  Continue Relafen 500 b.i.d.   I will see him back in 1-2 months.      Erick Colace, M.D.  Electronically Signed     AEK/MedQ  D:  08/26/2004 12:59:54  T:  08/26/2004 13:38:29  Job #:  [id number]   cc:   Donalee Citrin, M.D.  [address]  [place] [address]  Fax: [phone]   Kerrin Champagne, M.D.  [address]  [place]  [place] [address]  Fax: [phone]

## 2010-05-30 NOTE — Op Note (Signed)
[place]  Patient:    Joshua [name], Joshua [name]                       MRN: [id number] Proc. Date: 04/02/99 Adm. Date:  [id number] Attending:  Andre Lefort CC:         Leroy Sea., M.D.                           Operative Report  CCS #[id number]  DATE OF BIRTH:  [birth year]  PREOPERATIVE DIAGNOSIS:  Right inguinal hernia.  POSTOPERATIVE DIAGNOSIS:  Medium size indirect right inguinal hernia.  OPERATION:  Laparoscopic right inguinal hernia repair with precut mesh.  SURGEON:  Sandria Bales. Ezzard Standing, M.D.  FIRST ASSISTANT:  None.  ANESTHESIA:  General endotracheal.  ESTIMATED BLOOD LOSS:  Minimal  INDICATION FOR PROCEDURE:  Mr. Odor is a 65 year old white male who has an increasing symptomatic right inguinal hernia.  He comes for repair of this hernia.  DESCRIPTION OF PROCEDURE:  The patient is placed in the supine position with both his arms tucked. Foley catheter placed.  Given 1 gram of Ancef. His lower abdomen was shaved and prepped with Betadine solution and sterilely draped.  An infraumbilical incision was made with sharp dissection and carried down to the rectus abdominis fascia.  I went through the anterior fascia on the right side retracting the rectus abdominis muscle anteriorly and passing the [place] [employer] PBS balloon into the preperitoneal space.  This was then insufflated under direct visualization showing a moderately good dissection of the peritoneum posteriorly, the muscle anteriorly.  The PBS balloon was then removed and the 10 mm Hasson trocar inserted with the inflatable bulb and blown up.  Dissection was targeted mainly to the right side in that this was where the hernia was on physical examination.  I identified Coopers ligament. He had no evidence of direct inguinal hernia.  The coursers were identified.  Along the anterior medial surface of the cord, was a sac which was probably about 7 or 8 mm in  size.  This was dissected free from the cord structures and pulled back to the level of the anterior iliac spine. I then isolated the cord structures identifying the vas, the blood vessels and so no other defect. I did reduce a moderate size lipoma out of the cord also.  I then used the precut atrium mesh and laid this into the inguinal floor. I tacked it medially to the pubic bone, inferiorly to the shelving edge of Coopers ligament, superiorly to transversalis fascia, wrapped the mesh around the cord nd tacked it around the cord and then tacked out laterally only where I could palpate.  The mesh lay flat.  I got a good coverage of both the direct inguinal floor and the internal ring and the mesh lay well.  Each trocar was then removed.  The fascia was closed with a 0 Vicryl suture.  Each site was closed with a 5-0 Vicryl suture painted with tincture of Benzoin, Steri-Strips and sterilely dressed.  The patient tolerated the procedure well and was transported to the recovery room in good condition. Sponge and needle count were correct at the end of the case.  DD:  04/02/99 TD:  04/03/99 Job: [id number]

## 2010-05-30 NOTE — Procedures (Signed)
NAME:  WORTH, KOBER                          ACCOUNT NO.:  [id number]   MEDICAL RECORD NO.:  [id number]                   PATIENT TYPE:  REC   LOCATION:  [facility]                                  FACILITY:  [facility]   PHYSICIAN:  Celene Kras, MD                     DATE OF BIRTH:  1946/01/03   DATE OF PROCEDURE:  DATE OF DISCHARGE:                                 OPERATIVE REPORT   Joshua [name] is a patient who is known to me.  I reviewed health and history  form, 14-point review of systems.   1. Joshua [name] comes in after demonstrating clear positive provocative block,     left side medial branch injection, cervical spine, at C4, 5, 6 and 7.  He     has improved range of motion functional indices and quality of life     indices with approximately three weeks diminished pain perception with     less myofascial component.  His myofascial component is starting to     return, with recrudescence in the suprascapular,  levator scapular     region.  He has pain with flexion and extension and his exam is     consistent with facetal overlay.   1. His degenerative components in the cervical and lumbar spine are clearly     problematic to him with most lifestyle activities.  He has noted that the     medial branch injection did afford him improved quality of life, and so     we will go ahead and proceed with radiofrequency neuroablation.  I do not     think we need to proceed with the second medial branch injection, and     will only address the most problematic side, that being left, consider     the right later based on need.   1. He is an individual who is a very active individual with good lifestyle     profile, we encourage continuation of his normal activities, and follow-     up with Dr. Wynn Banker for the biomechanical model.   I reviewed his medication, he is appropriate.  He did notice decreased  medication use after the medial branch injection, another rationale for  performing  radiofrequency neuroablation.   He understands the risks of this procedure inclusive of bleeding, infection,  nerve damage, stroke, seizure, death.  No improvement, making his pain  worse, and potential for neuritis which might be prolonged, with associated  numbness, or neurological deficit.  He accepts this.   He has consented for today's procedure.   Objectively, diffuse paracervical and myofascial discomfort, positive  cervical facetal compression test, right greater than left.  Suboccipital  compression test positive.  He has suprascapular, levator scapular  myofascial pain that is familiar to [place].  He has no other overt neurological  deficit motor, sensory, reflexes.   IMPRESSION:  1.  Cervicalgia __________ cervical spine, cervical facet medial branch     referred pain.  2. Cervical facet syndrome.   PLAN:  Cervical facet radiofrequency neuroablation to C4, 5 and 6 at  independent needle access points.  He is consented.  Consider 7 at a later  date, but I will have to do this in prone position due to physical habitus.  I do think we could probably access to the 7 level, but at this time it is  not described as most problematic in a referred pain pattern, and I think we  are going to be fine at just these three levels.  He is consented and  discharge instructions are understood by the patient who receives no  sedation.   Patient is taken to the Fluoroscopy Suite and placed in the supine position  after prep and drape in the usual fashion.  Using a 22-gauge RF needle, 5 mm  active tip, I advanced to the cervical facet medial branch at C4, 5 and 6 at  independent needle access points, confirmed placement in multiple  fluoroscopic positions.  I used Isovue 200.  I appropriately stimulated both  motor and sensory and reconfirmed needle placement at multiple points during  our procedure.  The lesion is performed under controlled circumstances at 60  degrees for 60 seconds.  Prior  to lesion 0.5 cc of lidocaine 1% MPF was  injected with a total of 40 mg of Aristocort in divided doses.   Isovue 200 was utilized to confirm needle placement in multiple fluoroscopic  positions.   He tolerated this procedure well.  No complication was identified from our  procedure.  __________ plate diminution of pain perception at discharge.  Will follow up with him in about one to one and one-half months or earlier  if needed.  He understands our discharge instructions within the context of  functional enhancement.                                                Celene Kras, MD    HH/MEDQ  D:  01/09/2003 10:31:26  T:  01/09/2003 12:22:09  Job:  [id number]

## 2010-05-30 NOTE — Assessment & Plan Note (Signed)
[facility]                              CARDIOLOGY OFFICE NOTE   Joshua [name], Joshua [name]                       MRN:          [id number]  DATE:08/25/2005                            DOB:          1945-12-27    [name] is seen for cardiology followup.  I saw him last February of 2006.  He has normal coronaries.  He does have palpitations.  He also has some  ventricular ectopy after he exercises.  He saw Dr. Shelle Iron for the  possibility of an obstructive sleep apnea.  There were no classic findings,  but there were some questionable findings, and it appears that doing a sleep  study was most appropriate.  I feel that we probably should proceed with  this.  The patient mentioned that his wife awoke him the other night saying  that she thought he was not breathing well.  He does not snore heavily, but  there are some questions here.  The patient does have some shortness of  breath at times, and it is intermittent and short lived.   Most recently, he has felt some palpitations.  He also has had what he calls  head rush.  This is a sensation that he feels when standing after sitting.  He has had them frequently.  He has not had any true syncope, but he has  felt the possibility of mild presyncope.  Separate from this is vertigo that  he has had, and he has had done some exercise that he was taught, and this  is more stable.   He also is concerned about has blood pressure.  It is nicely controlled but  possibly relatively low for him with a systolic of 116.  He is only the beta-  blocker.  He takes 100 mg at nighttime because this has been the best for  his palpitations which was a major problem in the past.  He wore an event  recorder in the past.  This showed that he had PVCs when he had his  sensation of irregular heart beat; however, there were no couplets or  ventricular tachycardia.  His cath looked good but when he had a Cardiolite  there was no  ischemia but off beta-blockers he had CBCs, and he did have two  episodes of four beats of ventricular tachycardia in the recovery period  after the stress study.  He was put back on his beta-blocker, and he has  been stable since then.   ALLERGIES:  Question HYTRIN.  Question FLOXIN ANTIBIOTICS.   MEDICATIONS:  Zocor 20, aspirin 81, Prevacid, ibuprofen, Zyrtec eyedrops,  metoprolol 100 at night and Flonase.   OTHER MEDICAL PROBLEMS:  See the list below.   REVIEW OF SYSTEMS:  Other than the HPI, review of systems is negative.   PHYSICAL EXAMINATION:  VITAL SIGNS:  Blood pressure is 116/72 with a pulse  of 59.  LUNGS:  Clear.  Respiratory effort is not labored.  HEENT:  No xanthelasmas.  He has normal extraocular motion.  There are no  carotid bruits.  There is no  jugular venous distention.  CARDIAC:  S1 and S2.  There are no clicks or significant murmurs.  ABDOMEN:  Soft.  There are no masses or bruits.  EXTREMITIES:  There is no peripheral edema.  NEURO:  The patient is oriented to person, time and place, and his affect is  normal.  MUSCULOSKELETAL:  There are no major musculoskeletal deformities.   EKG is normal.   PROBLEM LIST:  1. Normal coronaries in 1996.  2. Hypertension.  At this time his blood pressure if anything is on the      low side.  We will decrease his metoprolol from 100 mg down to 50 mg,      and he takes this only at night.  3. History of gastroesophageal reflux disease.  4. History of back and neck surgery.  5. Hyperlipidemia, treated.  6. Palpitations.  We know that he has had scattered PVCs.  We also know      that he had some four-beat runs of ventricular tachycardia when off the      beta-blocker after stress with no ischemia.  We will follow this as I      lower his beta-blocker dose slightly.  7. Possibility of sleep apnea.  See the discussion by Dr. Shelle Iron.  After I      see the patient back next time we will push harder for the sleep study       to see if this is possibly playing a role.  8. Vertigo.  This appears to be stable.  9. Episodes of feeling of head rush.  Exact etiology is not clear.  The      patient is staying hydrated.  We will check carotid Dopplers to be sure      there is no major vascular inflow problem.  I will then see him back      for followup and will make further decisions.  10.History of hernia surgery.   I will see him back for followup.                               Joshua Abed, MD, [facility]    JDK/MedQ  DD:  08/25/2005  DT:  08/26/2005  Job #:  [id number]

## 2010-05-30 NOTE — Assessment & Plan Note (Signed)
REASON FOR VISIT:  Joshua [name] returns after I last saw him on October 19, 2002.  Since that time he has had the cervical facet medial branch blocks  left side C4-5-6-7 on November 07, 2002.  Has had about a three-week relief  of his neck pain and only in the last week or so has he had some recurrence  of his pain.  He has and no new problems.  His main problem is with looking  downward and towards the left side.  He has occasional pain into the left  shoulder and elbow, some pain in bilateral wrists, but no numbness or  tingling in the hands.   Pain interference scores are 3 for general activity, 0 for mood, 0 for  walking ability, 3 for work, 0 for relationship with other people, 2 for  sleep, 0 for enjoyment of life.  His pain score currently is 5 out 10.   CURRENT MEDICATIONS:  1. Metoprolol 75 p.o. daily.  2. Zocor 20 p.o. daily.  3. Ultracet - he takes one to two a day.  4. Aspirin 81 mg a day.  5. Protonix 40 mg a day.   REVIEW OF SYSTEMS:  Positive for reflux, heartburn.  No bowel or bladder  symptoms.   EXAMINATION:  Blood pressure 129/81, pulse 81, O2 saturation 96% on room  air.   Neck has decreased left bending as well as left rotation approximately 25%.  He has 75% forward flexion and 75% extension accompanied by end-range pain  bilaterally in the left side of neck.  His upper extremities have full  strength bilateral deltoid, biceps, triceps, grip, as well as lower  extremities full with knee extension, hip flexion, and ankle dorsiflexion at  5/5.  His sensation is normal bilateral upper and lower extremities.  Deep  tendon reflexes normal bilateral upper and lower extremities.   IMPRESSION:  Cervical facet syndrome, cervicalgia, improved with facet  blocks, left-sided.   PLAN:  1. Will refer to Dr. Stevphen Rochester for radiofrequency ablation.  2. I will see him back in two months.  3. Continue current medications.      Erick Colace, M.D.   AEK/MedQ  D:   12/22/2002 12:46:12  T:  12/22/2002 14:07:59  Job #:  [id number]   cc:   Celene Kras, MD  Fax: [phone]

## 2010-05-30 NOTE — Assessment & Plan Note (Signed)
HISTORY OF PRESENT ILLNESS:  [name] returns today after he last saw me on  July 09, 2003. At that time, he was doing well in terms of his neck. He did  have some back soreness. In the interval time, he has noticed increasing  pain in his neck, particularly the left side of his neck, radiating down  into the shoulder. His pain is 3 to 6 out of 10, averaging around 6. He does  have some weakness left ankle dorsiflexion, which he states is chronic the  last couple of years. No other new interval history.   CURRENT MEDICATIONS:  Prevacid, Zocor, aspirin, _________.  He takes  ibuprofen up to 800 mg at a time and he is on Neurontin 300 b.i.d. and has  had excessive dizziness when up to t.i.d.   SOCIAL HISTORY:  Married. Works full-time. Planning a trip to [place] soon.   PHYSICAL EXAMINATION:  VITAL SIGNS:  Blood pressure 126/85, pulse 62,  respiratory rate 20. O2 saturation 100% room air.  GENERAL:  No acute distress. Mood and affect appropriate. Appearance is  normal.  BACK:  He has no tenderness to palpation. He has forward flexion, about 75%  range. Extension is to neutral. Lateral bending and rotation about 50% in  range.  EXTREMITIES:  He has normal strength in bilateral lower extremities with the  exception of left ankle dorsiflexion, which is 4 minus over 5. He has normal  deep tendon reflexes bilateral lower extremities. Remainder of neuromuscular  examination is normal in the lower extremities. In the upper extremities, he  has normal upper extremity strength, sensation and tone. His neck range of  motion now is reduced and he gets to less than 10 degrees of left sided  rotation and lateral bending was right sided, is at least 75% range.  Extension and flexion are limited to 50% of normal.   IMPRESSION:  1.  Cervicalgia, cervical facet syndrome 10 months post cervical RF.  2.  Lumbar disk disease with history of left laminectomy x3 L5 level and      laminectomy times 1 on the  right. Has some post laminectomy syndrome in      the lower extremities with some paresthesias, dysesthesias, managed by      Neurontin.   PLAN:  1.  Will send him back to Dr. Stevphen [name] to evaluate for a repeat RF. Will      likely do medial branch blocks first.  2.  Continue Neurontin.  3.  I will see him back after Dr. Kerry [name] evaluation and treatment.       AEK/MedQ  D:  11/08/2003 13:52:39  T:  11/09/2003 12:00:51  Job #:  [id number]   cc:   Joshua [name], M.D.  [address]  [place]  [place] [address]  Fax: [phone]

## 2010-05-30 NOTE — Assessment & Plan Note (Signed)
HISTORY OF PRESENT ILLNESS:  [name] returns today.  He was last seen by  me February 20, 2003.  He has had a cervical facet radiofrequency neural  ablation at C4-5-6 for Dr. Tonny Bollman on January 09, 2003.  He notices some  minimal increase in symptoms, but doing quite well overall in regards to  that.  He returns today mainly in regards to his low back pain.  He has left  greater than right sided low back pain.  In fact, two weeks ago spent about  four days in bed with exacerbation of this pain.  He has had problems with  numbness and tingling in his legs since at least last summer.  He had an EMG  at [facility] per his report indicating what sounds  like some chronic radiculopathy, left greater than right.  His last MRI was  last summer.  I do not have the report.  He does have a past surgical  history of left L5 laminectomy in 1976, left L5 laminectomy redo 1982 and a  right L5 laminectomy 1996 where he reported he had some S1 nerve root  involvement.  Denies any bowel and bladder symptoms.  His main pain is in  the anterior as well as posterior thighs and low back, but also has pain  down into his feet, left greater than right.  In addition, he has difficulty  crossing his right leg over his left leg.   MEDICATIONS:  He has been prescribed Motrin 800 per his primary care  physician.  He does take Ultracet at night, but he does wake up with an  upset stomach in the morning.  Other medications include Prevacid, Zocor,  aspirin 81 mg and metoprolol.   REVIEW OF SYSTEMS:  As above.   PHYSICAL EXAMINATION:  VITAL SIGNS:  Blood pressure 147/79, pulse 83, O2  saturation 98% on room air.  NEUROLOGICAL:  Decreased range of motion left side of rotation.  He has  hyperactive reflux with 3+ bilateral upper extremities and bilateral knees  but 0 bilateral ankles.  His normal sensation in bilateral upper extremities  and normal range of motion bilateral upper extremities.   In lower  extremities, he has pain with right fabere maneuver in the SI joint area.  He has no pain on palpation in the lower extremities.  He has no joint  swelling.  He has full hip range of motion.  His lower extremity strength is  full.  His back has no tenderness to palpation.  Forward flexion is 50%.  Extension is 25% lateral rotation and bending is 25%.   TREATMENT:  Past treatments include caudal epidural done July 14, 2002.  He  feels like he actually got a bit weaker after this.  He had an L4-5 lumbar  epidural June 30, 2002, and in December 2003, had right S1 nerve root  injection and right L5 nerve root block, transforaminal epidural on September 06, 2001.   A chart review of MRI lumbar spine July 06, 2000, disc desiccation at  multiple lumbar levels, annular rent L4-5, plus hypertrophic changes at L5-  S1.  No significant nerve root encroachment.   IMPRESSION:  1. Cervical facet arthropathy.  Has had good result from cervical     radiofrequency.  2. Chronic low back pain with lower extremity radiation patterns.  Appears     to be partially radicular in nature.  At least the pain going down is 8.  He may have some radiating pain from his lumbar facets given his lack of     extension and pain with this.  In addition, he may have right SI joint     problems as well.  3. Lower extremity weakness, likely deconditioning versus radiculopathy     effect, although, given his MRI findings in 2002, he does not seem to     have any major compressive lesions.  Would like to get a copy of his EMG.     Have asked him to get one from Joshua [name] office.   PLAN:  1. Will send him to physical therapy.  2. Start Neurontin 100 mg t.i.d.  3. Discussed additional spinal injections, diagnostic/therapeutic purposes.      Joshua [name], M.D.   AEK/MedQ  D:  05/08/2003 10:24:27  T:  05/08/2003 11:09:28  Job #:  [id number]   cc:   Redge Gainer [address]   Marko Plume,  M.D.   Teena Irani. Arlyce Dice, M.D.  [address]  [place]  [place] [address]  Fax: [phone]

## 2010-05-30 NOTE — Assessment & Plan Note (Signed)
[facility]                              CARDIOLOGY OFFICE NOTE   Joshua [name], Joshua [name]                       MRN:          [id number]  DATE:10/08/2005                            DOB:          13-Dec-1945    HISTORY OF PRESENT ILLNESS:  See my complete note of August 25, 2005. I saw  the patient then and he was having feeling of a rushing sensation to his  head. His blood pressure was on the low side at that point. We cut his  metoprolol dose down. Also proceeded with carotid Doppler's to be sure that  he had no major carotid disease. The carotid Doppler's are normal.   The patient returns today and he feels much better. Therefore, by adjusting  his beta blocker dose downward, he is feeling much better. In addition, he  has not had any significant increase in his palpitations with this.   DISPOSITION:  Therefore, [name] is stable. No further workup is needed.  I will see him back in 12 months.            ______________________________  Luis Abed, MD, [facility]     JDK/MedQ  DD:  10/08/2005  DT:  10/10/2005  Job #:  [id number]

## 2010-05-30 NOTE — Op Note (Signed)
NAME:  Joshua, [name]                          ACCOUNT NO.:  [id number]   MEDICAL RECORD NO.:  [id number]                   PATIENT TYPE:  REC   LOCATION:                                       FACILITY:   PHYSICIAN:  Celene Kras, MD                     DATE OF BIRTH:  Mar 15, 1945   DATE OF PROCEDURE:  11/07/2002  DATE OF DISCHARGE:                                 OPERATIVE REPORT   [name] .  I evaluated  __________ on a 14-point review of systems. We are assisted by Dr.  Wynn Banker.  He is familiar with the patient.   1. As I reviewed the chart, progress to date, symptoms as they present, and     medical indications, I do think it reasonable to go ahead and proceed     with facetal injection, left side, medial branch at C4, 5, 6, and 7, with     contributory innervation addressed.  This should reflect the contributory     elements, as well as the presenting symptoms.  2. I do not believe further imaging or diagnostics is warranted at this time     and will defer to Dr. Wynn Banker.  3. Lifestyle enhancement is reviewed.  4. Consider muscle stimulator.  Defer to Dr. Wynn Banker.   __________ diffuse paracervical and myofascial discomfort, positive cervical  facetal compression test, left greater than right.  Suprascapular, levator  scapulae, and myofascial pain.  No new neurological findings in motor,  sensory, and reflexes.   IMPRESSION:  Cervicalgia.  Degenerative spine disease, cervical spine.  Cervical facet syndrome.   PLAN:  Cervical facet medial branch injection, C4, 5, 6, and 7.  Independent  needle access points.  He has consented.   PROCEDURE:  The patient was taken to fluoroscopy suite and placed in the  supine position.  The neck was prepped and draped in the usual fashion.  Using a 25-gauge needle, I advanced to the facet and medial branch at C4, 5,  6, and 7 with contributory innervation addressed.  I confirmed  placement in  multiple fluoroscopic positions.  Independent needle access points, I then  test block uneventfully followed with 0.5 mL of lidocaine 1% NPF with 4 at  each location with a total of 40 mg of Aristocort in divided dose.   He tolerated the procedure well.  There were no complications from that  procedure.  Discharge instructions were given.  He was seen in followup.                                               Celene Kras, MD    HH/MEDQ  D:  11/07/2002  T:  11/07/2002  Job:  [id number]

## 2010-08-28 ENCOUNTER — Other Ambulatory Visit: Payer: Self-pay | Admitting: Dermatology

## 2011-03-09 ENCOUNTER — Other Ambulatory Visit: Payer: Self-pay | Admitting: Dermatology

## 2011-05-07 ENCOUNTER — Encounter: Payer: Self-pay | Admitting: Neurology

## 2011-05-21 ENCOUNTER — Ambulatory Visit (INDEPENDENT_AMBULATORY_CARE_PROVIDER_SITE_OTHER): Payer: Managed Care, Other (non HMO) | Admitting: Cardiology

## 2011-05-21 ENCOUNTER — Encounter: Payer: Self-pay | Admitting: Cardiology

## 2011-05-21 VITALS — BP 110/78 | HR 61 | Ht 73.0 in | Wt 229.4 lb

## 2011-05-21 DIAGNOSIS — R259 Unspecified abnormal involuntary movements: Secondary | ICD-10-CM

## 2011-05-21 DIAGNOSIS — R251 Tremor, unspecified: Secondary | ICD-10-CM

## 2011-05-21 DIAGNOSIS — R002 Palpitations: Secondary | ICD-10-CM

## 2011-05-21 DIAGNOSIS — I10 Essential (primary) hypertension: Secondary | ICD-10-CM

## 2011-05-21 NOTE — Progress Notes (Signed)
HPI Patient is seen back with a history of palpitations. He's doing very well. He had back surgery on 2 occasions and with a second procedure that was more extensive he is now doing well. He's not having any chest pain. He's not having any significant palpitations.  Allergies  Allergen Reactions  . Floxin (Ofloxacin)     Current Outpatient Prescriptions  Medication Sig Dispense Refill  . aspirin 81 MG tablet Take 81 mg by mouth daily.        [name] BEPREVE 1.5 % SOLN Place into both eyes as needed. 1.5% one drop in both as need two to four times a day      . DYMISTA 137-50 MCG/ACT SUSP One spray each nostril twice a day.       . finasteride (PROSCAR) 5 MG tablet Take 5 mg by mouth Daily.      . fish oil-omega-3 fatty acids 1000 MG capsule 1200 mg daily       . ibuprofen (ADVIL,MOTRIN) 800 MG tablet As needed       . lansoprazole (PREVACID) 30 MG capsule Take 30 mg by mouth Daily.      [name] levalbuterol (XOPENEX HFA) 45 MCG/ACT inhaler Inhale 1-2 puffs into the lungs every 6 (six) hours as needed.      [name] levocetirizine (XYZAL) 5 MG tablet Take 5 mg by mouth every evening.        . metoprolol (TOPROL-XL) 50 MG 24 hr tablet Take 100 mg by mouth daily.       . Mometasone Furo-Formoterol Fum (DULERA IN) Inhale into the lungs 2 (two) times daily.      . montelukast (SINGULAIR) 10 MG tablet Take 10 mg by mouth at bedtime.        . rosuvastatin (CRESTOR) 10 MG tablet Take 1/2 tablet daily       . Tamsulosin HCl (FLOMAX) 0.4 MG CAPS Take 0.4 mg by mouth daily.        [name] zolpidem (AMBIEN) 10 MG tablet Take 10 mg by mouth at bedtime.        History   Social History  . Marital Status: Married    Spouse Name: N/A    Number of Children: N/A  . Years of Education: N/A   Occupational History  . Not on file.   Social History Main Topics  . Smoking status: Never Smoker   . Smokeless tobacco: Not on file  . Alcohol Use: No  . Drug Use: No  . Sexually Active: Not on file   Other Topics Concern    . Not on file   Social History Narrative  . No narrative on file    Family History  Problem Relation Age of Onset  . Heart attack Father     Past Medical History  Diagnosis Date  . Vertigo   . Sleep apnea   . Palpitations     Benign PVCs  . Dyslipidemia   . GERD (gastroesophageal reflux disease)   . HTN (hypertension)   . Chest pain     Coronaries normal 1996 /  nuclear..06/2002..normal...EF  56% /  stress echo.. May, 2011.... no  scar or ischemia... rate related RBBB  . RBBB (right bundle branch block)     rate related  . Hx of colonoscopy   . Ejection fraction     EF 60%, stress echo, May, 2011  . Carotid artery disease     Carotid Doppler normal August, 2007    Past Surgical History  Procedure Date  . Back surgery     ROS  Patient denies fever, chills, headache, sweats, rash, change in vision, change in hearing, chest pain, cough, nausea vomiting, urinary symptoms. All other systems are reviewed and are negative.  PHYSICAL EXAM  Patient is oriented to person time and place. Affect is normal. There is no jugulovenous distention. Lungs are clear. Respiratory effort is nonlabored. Cardiac exam is S1 and S2. There no clicks or significant murmurs. The abdomen is soft. There is no peripheral edema.  Filed Vitals:   05/21/11 0946  BP: 110/78  Pulse: 61  Height: 6\' 1"  (1.854 m)  Weight: 229 lb 6.4 oz (104.055 kg)   EKG is done today and reviewed by me. It is normal. There is no change.  ASSESSMENT & PLAN

## 2011-05-21 NOTE — Assessment & Plan Note (Signed)
Patient has had a mild tremor of her time. He thought it might be getting worse. His beta blocker dose was increased and he feels that this has had a good affect.

## 2011-05-21 NOTE — Patient Instructions (Signed)
Your physician wants you to follow-up in: 1 year. You will receive a reminder letter in the mail two months in advance. If you don't receive a letter, please call our office to schedule the follow-up appointment.  

## 2011-05-21 NOTE — Assessment & Plan Note (Signed)
Blood pressures control. No change in therapy. 

## 2011-05-21 NOTE — Assessment & Plan Note (Signed)
He is not having any significant palpitations. No change in therapy. 

## 2011-06-29 ENCOUNTER — Other Ambulatory Visit: Payer: Self-pay | Admitting: Neurosurgery

## 2011-06-29 DIAGNOSIS — M4716 Other spondylosis with myelopathy, lumbar region: Secondary | ICD-10-CM

## 2011-07-13 ENCOUNTER — Encounter: Payer: Self-pay | Admitting: Neurology

## 2011-07-13 ENCOUNTER — Ambulatory Visit (INDEPENDENT_AMBULATORY_CARE_PROVIDER_SITE_OTHER): Payer: Managed Care, Other (non HMO) | Admitting: Neurology

## 2011-07-13 VITALS — BP 110/72 | HR 72 | Wt 229.0 lb

## 2011-07-13 DIAGNOSIS — R569 Unspecified convulsions: Secondary | ICD-10-CM

## 2011-07-13 DIAGNOSIS — G450 Vertebro-basilar artery syndrome: Secondary | ICD-10-CM

## 2011-07-13 MED ORDER — TOPIRAMATE 25 MG PO TABS: ORAL_TABLET | ORAL | Status: DC

## 2011-07-13 NOTE — Patient Instructions (Addendum)
Your MRI and MRA's are scheduled for Friday, July 19 at 8:00am.   Please arrive to [facility] MRI by 7:45am.  [phone].  We will call you with your EEG appointment.  It will be done at [facility]. [phone].   Titration to Topamax(generic name - topiramate) 50mg  twice a day using  25mg  tablets.   Morning Dose Evening Dose  Week 1  0 tablets  1 tablet (25mg )  Week 2 1 tablet (25mg ) 1 tablet (25mg )  Week 3 1 tablet (25mg ) 2 tablets (50mg )  Week 4 2 tablets (50mg ) 2 tablets (50mg )    After Week 4 continue taking 2 tablets twice a day. Titration requires 70 25mg  tablets.

## 2011-07-13 NOTE — Progress Notes (Signed)
Dear Dr. Clelia Croft,  Thank you for having me see Joshua [name] in consultation today at [facility] for his problem with essential tremor and leg weakness.  As you may recall, he is a 66 y.o. year old RHD male with a history of multiple lumbar spine and one cervical spine surgery with shakiness since his twenties.  It has gotten worse over the last few years.  He has noticed some decreased dexterity in his left hand.  Difficulty using tools or eating at times.  Handwriting has not changed.  Does not drink alcohol enough to notice if it improves it.  His metoprolol increase has not appreciably helped.  He also complains of main left leg weakness.  He has had multiple herniated disks and multiple diskectomies at L5-S1 bilaterally, as well as L3-L4 fusion after 1 diskectomy.  This has left him with left leg foot plantar flexion weakness and bilateral sensory loss.  Apparently he is due for another MRI of his L-spine in the coming weak.  In addition, he has spells of vertigo.  These have been going on for about 5 years.  They are positional, brought on by head movement but also can come on spontaneously.  No change in hearing during vertigo.  Typically last 5-10 minutes but can last up to an hour.  They can occur when he lies down on his left side but also can occur when he is sitting with no movement.  He can get tinnitus with the vertigo.  He has had head pain after them, but it is dull.  No dysphagia or dysarthria or double vision.  He also gets what he calls his tinfoil spells.  Flashing tinfoil seen in both eyes lasting minutes.  These have been going on 5+ years and can be as infrequent as once per month or as frequent as multiple times per day.  Never accompanied by headache.  The visual obscuration can move.  Bright light can precipitate.    Past Medical History  Diagnosis Date  . Vertigo   . Sleep apnea   . Palpitations     Benign PVCs  . Dyslipidemia   . GERD (gastroesophageal reflux  disease)   . HTN (hypertension)   . Chest pain     Coronaries normal 1996 /  nuclear..06/2002..normal...EF  56% /  stress echo.. May, 2011.... no  scar or ischemia... rate related RBBB  . RBBB (right bundle branch block)     rate related  . Hx of colonoscopy   . Ejection fraction     EF 60%, stress echo, May, 2011  . Carotid artery disease     Carotid Doppler normal August, 2007  . Tremor     Hand tremor    Past Surgical History  Procedure Date  . Back surgery   . Neck surgery 2002    History   Social History  . Marital Status: Married    Spouse Name: N/A    Number of Children: N/A  . Years of Education: N/A   Social History Main Topics  . Smoking status: Never Smoker   . Smokeless tobacco: Never Used  . Alcohol Use: No  . Drug Use: No  . Sexually Active: None   Other Topics Concern  . None   Social History Narrative  . None    Family History  Problem Relation Age of Onset  . Heart attack Father     Current Outpatient Prescriptions on File Prior to Visit  Medication Sig Dispense  Refill  . aspirin 81 MG tablet Take 81 mg by mouth daily.        [name] BEPREVE 1.5 % SOLN Place into both eyes as needed. 1.5% one drop in both as need two to four times a day      . DYMISTA 137-50 MCG/ACT SUSP One spray each nostril twice a day.       . finasteride (PROSCAR) 5 MG tablet Take 5 mg by mouth Daily.      . fish oil-omega-3 fatty acids 1000 MG capsule 1200 mg daily       . ibuprofen (ADVIL,MOTRIN) 800 MG tablet As needed       . lansoprazole (PREVACID) 30 MG capsule Take 30 mg by mouth Daily.      [name] levalbuterol (XOPENEX HFA) 45 MCG/ACT inhaler Inhale 1-2 puffs into the lungs every 6 (six) hours as needed.      [name] levocetirizine (XYZAL) 5 MG tablet Take 5 mg by mouth every evening.        . metoprolol (TOPROL-XL) 50 MG 24 hr tablet Take 100 mg by mouth daily.       . Mometasone Furo-Formoterol Fum (DULERA IN) Inhale into the lungs 2 (two) times daily.      . montelukast  (SINGULAIR) 10 MG tablet Take 10 mg by mouth at bedtime.        . rosuvastatin (CRESTOR) 10 MG tablet Take 1/2 tablet daily       . Tamsulosin HCl (FLOMAX) 0.4 MG CAPS Take 0.4 mg by mouth daily.        [name] zolpidem (AMBIEN) 10 MG tablet Take 10 mg by mouth at bedtime.      . topiramate (TOPAMAX) 25 MG tablet increase to 2 tabs twice a day as directed.  120 tablet  3    Allergies  Allergen Reactions  . Floxin (Ofloxacin)       ROS:  13 systems were reviewed and are notable for chronic neck and back pain.  All other review of systems are unremarkable.   Examination:  Filed Vitals:   07/13/11 1517  BP: 110/72  Pulse: 72  Weight: 229 lb (103.874 kg)     In general, well appearing man.  Cardiovascular: The patient has a regular rate and rhythm.  Fundoscopy:  Disks are flat. Vessel caliber within normal limits.  Mental status:   The patient is oriented to person, place and time. Recent and remote memory are intact. Attention span and concentration are normal. Language including repetition, naming, following commands are intact. Fund of knowledge of current and historical events, as well as vocabulary are normal.  Cranial Nerves: Pupils are equally round and reactive to light. Visual fields full to confrontation. Extraocular movements are intact without nystagmus. Facial sensation and muscles of mastication are intact. Muscles of facial expression are symmetric. Hearing intact to bilateral finger rub. Tongue protrusion, uvula, palate midline.  Shoulder shrug intact   Motor:  The patient has normal bulk and tone, no pronator drift.  There are no adventitious movements.  5/5 muscle strength bilaterally except 4/5 foot plantar flexion weakness in left leg and 4/5 knee flexion weakness in left leg.  Reflexes:   Biceps  Triceps Brachioradialis Knee Ankle  Right 2+  2+  2+   2+ 0 Left  2+  2+  2+   2+ 0  Toes down  Coordination:  Normal finger to nose.  No  dysdiadokinesia.  Sensation is lost to pin in L4 and S1 distribution  bilaterally, and L3 in right leg.  Gait and Station are normal.    Romberg is negative  Vest: - DH, + headthrust to left, [place]   Impression/Recs: 1.  Tremor - almost certainly benign essential.  Normally I would advise switching him to propranolol from metoprolol but I will try another approach below. 2.  Spells of tin foil - likely ophthalmic migraines.  3.  Vertigo - likely peripheral, perhaps BPPV.  However, I am fascinated with the idea that this might be migrainous vertigo so I am going to start  Topamax and increase to 50 bid for migraine prevention(it also works for tremor). I am also going to get an EEG and MRI brain/MRA head and neck.  Finally, if he doesn't get improvement, I will send him to vest rehab. 4.  Leg weakness - seems consistent with LS radiculopathy from herniated disks, spondylosis.  Will look at MRI when he returns.   We will see the patient back in 2 months.  Thank you for having [place] see Joshua [name] in consultation.  Feel free to contact me with any questions.  Lupita Raider Modesto Charon, MD [facility], [place] [address] [place], [place] [address] Phone: [phone] Fax: [phone].

## 2011-07-14 ENCOUNTER — Ambulatory Visit
Admission: RE | Admit: 2011-07-14 | Discharge: 2011-07-14 | Disposition: A | Discharge status: Home / Self Care | Payer: Managed Care, Other (non HMO) | Source: Ambulatory Visit | Attending: Neurosurgery | Admitting: Neurosurgery

## 2011-07-14 ENCOUNTER — Telehealth: Payer: Self-pay

## 2011-07-14 ENCOUNTER — Other Ambulatory Visit: Payer: Self-pay | Admitting: Neurosurgery

## 2011-07-14 DIAGNOSIS — M4716 Other spondylosis with myelopathy, lumbar region: Secondary | ICD-10-CM

## 2011-07-14 NOTE — Telephone Encounter (Signed)
Lm with appt time and date for pt's EEG at [facility] 7/16 at 10:00am.

## 2011-07-28 ENCOUNTER — Telehealth: Payer: Self-pay | Admitting: Neurology

## 2011-07-28 ENCOUNTER — Other Ambulatory Visit: Payer: Self-pay | Admitting: Neurology

## 2011-07-28 ENCOUNTER — Ambulatory Visit (HOSPITAL_COMMUNITY)
Admission: RE | Admit: 2011-07-28 | Discharge: 2011-07-28 | Disposition: A | Discharge status: Home / Self Care | Payer: Managed Care, Other (non HMO) | Source: Ambulatory Visit | Attending: Neurology | Admitting: Neurology

## 2011-07-28 DIAGNOSIS — G43909 Migraine, unspecified, not intractable, without status migrainosus: Secondary | ICD-10-CM | POA: Insufficient documentation

## 2011-07-28 DIAGNOSIS — R569 Unspecified convulsions: Secondary | ICD-10-CM

## 2011-07-28 DIAGNOSIS — R259 Unspecified abnormal involuntary movements: Secondary | ICD-10-CM | POA: Insufficient documentation

## 2011-07-28 DIAGNOSIS — R42 Dizziness and giddiness: Secondary | ICD-10-CM | POA: Insufficient documentation

## 2011-07-28 MED ORDER — ALPRAZOLAM 1 MG PO TABS: 1.0000 mg | ORAL_TABLET | ORAL | Status: AC

## 2011-07-28 NOTE — Telephone Encounter (Signed)
Jan - could you call in Xanax 1mg  30 minutes before MRI and and an extra 1mg  if necessary during MRI.  disp 2 tabs, no refills of course.  thx.

## 2011-07-28 NOTE — Procedures (Signed)
EEG NUMBER:  [id number].  This routine EEG was requested in this 66 year old man with a 4-year history of vertigo.  The patient states his onset becoming more severe and more frequent.  Long history of migraines and tremors in his left hand.  His medications include Ambien and topiramate.  The EEG was done with the patient awake and drowsy.  During periods of maximal wakefulness, he had a 9 cycle per second posterior dominant rhythm that attenuated with eye opening and was symmetric.  Background activities were composed of low amplitude, mixed alpha, and beta frequencies that were symmetric.  Photic stimulation produced a symmetric driving response. Hyperventilation was not performed.  The patient did become drowsy as evidenced by a decrease in the muscle activity, the onset of slow roving eye movements and alpha dropout. There were bursts of slower activities seen.  Sometimes, these appeared to lateralize to right temporal area.  No vertex sharp waves or sleep spindles were seen.  CLINICAL INTERPRETATION:  This routine EEG done with the patient awake and drowsy is normal.  Lateralized bursts of slower delta activities during drowsiness are of uncertain significance.          ______________________________ Denton Meek, MD    JX:BJYN D:  07/28/2011 13:20:39  T:  07/28/2011 13:43:22  Job #:  [id number]

## 2011-07-28 NOTE — Telephone Encounter (Signed)
Med called in and the patient is aware.

## 2011-07-28 NOTE — Telephone Encounter (Signed)
Pt would like something to calm him down sent to his pharmacy before his MRI scheduled for Friday 07/31/2011. [facility] in [place] listed in system. Please call pt and advise once this rx is done.

## 2011-07-31 ENCOUNTER — Other Ambulatory Visit (HOSPITAL_COMMUNITY): Payer: Managed Care, Other (non HMO)

## 2011-07-31 ENCOUNTER — Inpatient Hospital Stay (HOSPITAL_COMMUNITY): Admission: RE | Admit: 2011-07-31 | Payer: Managed Care, Other (non HMO) | Source: Ambulatory Visit

## 2011-07-31 ENCOUNTER — Ambulatory Visit (HOSPITAL_COMMUNITY)
Admission: RE | Admit: 2011-07-31 | Discharge: 2011-07-31 | Disposition: A | Discharge status: Home / Self Care | Payer: Managed Care, Other (non HMO) | Source: Ambulatory Visit | Attending: Neurology | Admitting: Neurology

## 2011-07-31 DIAGNOSIS — G450 Vertebro-basilar artery syndrome: Secondary | ICD-10-CM | POA: Insufficient documentation

## 2011-07-31 LAB — CREATININE, SERUM
Creatinine, Ser: 0.83 mg/dL (ref 0.50–1.35)
GFR calc Af Amer: >90 mL/min (ref >90)
GFR calc non Af Amer: >90 mL/min (ref >90)

## 2011-08-04 ENCOUNTER — Other Ambulatory Visit: Payer: Self-pay | Admitting: Neurology

## 2011-08-04 MED ORDER — TOPIRAMATE 25 MG PO TABS: ORAL_TABLET | ORAL | Status: DC

## 2011-09-16 ENCOUNTER — Ambulatory Visit: Payer: Managed Care, Other (non HMO) | Admitting: Neurology

## 2011-12-14 DIAGNOSIS — M19049 Primary osteoarthritis, unspecified hand: Secondary | ICD-10-CM | POA: Diagnosis not present

## 2011-12-22 DIAGNOSIS — M4716 Other spondylosis with myelopathy, lumbar region: Secondary | ICD-10-CM | POA: Diagnosis not present

## 2011-12-22 DIAGNOSIS — G039 Meningitis, unspecified: Secondary | ICD-10-CM | POA: Diagnosis not present

## 2012-01-11 DIAGNOSIS — H1045 Other chronic allergic conjunctivitis: Secondary | ICD-10-CM | POA: Diagnosis not present

## 2012-01-11 DIAGNOSIS — J45909 Unspecified asthma, uncomplicated: Secondary | ICD-10-CM | POA: Diagnosis not present

## 2012-01-11 DIAGNOSIS — J3089 Other allergic rhinitis: Secondary | ICD-10-CM | POA: Diagnosis not present

## 2012-01-25 DIAGNOSIS — M19049 Primary osteoarthritis, unspecified hand: Secondary | ICD-10-CM | POA: Diagnosis not present

## 2012-03-11 DIAGNOSIS — L578 Other skin changes due to chronic exposure to nonionizing radiation: Secondary | ICD-10-CM | POA: Diagnosis not present

## 2012-03-11 DIAGNOSIS — L819 Disorder of pigmentation, unspecified: Secondary | ICD-10-CM | POA: Diagnosis not present

## 2012-03-11 DIAGNOSIS — D239 Other benign neoplasm of skin, unspecified: Secondary | ICD-10-CM | POA: Diagnosis not present

## 2012-03-11 DIAGNOSIS — D1801 Hemangioma of skin and subcutaneous tissue: Secondary | ICD-10-CM | POA: Diagnosis not present

## 2012-03-11 DIAGNOSIS — L821 Other seborrheic keratosis: Secondary | ICD-10-CM | POA: Diagnosis not present

## 2012-03-24 DIAGNOSIS — M4716 Other spondylosis with myelopathy, lumbar region: Secondary | ICD-10-CM | POA: Diagnosis not present

## 2012-03-24 DIAGNOSIS — G039 Meningitis, unspecified: Secondary | ICD-10-CM | POA: Diagnosis not present

## 2012-03-29 ENCOUNTER — Ambulatory Visit (INDEPENDENT_AMBULATORY_CARE_PROVIDER_SITE_OTHER): Payer: Medicare Other | Admitting: Nurse Practitioner

## 2012-03-29 ENCOUNTER — Encounter: Payer: Self-pay | Admitting: Nurse Practitioner

## 2012-03-29 VITALS — BP 120/70 | HR 73 | Resp 20 | Ht 73.0 in | Wt 207.8 lb

## 2012-03-29 DIAGNOSIS — R079 Chest pain, unspecified: Secondary | ICD-10-CM

## 2012-03-29 NOTE — Patient Instructions (Addendum)
Stay on your current medicines  Keep up the good work on losing weight  We are going to arrange for a stress test (lexiscan)  Call the [facility] office at [phone] if you have any questions, problems or concerns.

## 2012-03-29 NOTE — Progress Notes (Signed)
Joshua [name] Date of Birth: 1945-04-30 Medical Record #[id number]  History of Present Illness: Joshua [name] is seen back today for a work in visit. He is seen for Dr. Myrtis Ser. He has no known CAD with remote cath in 1996, last objective testing in 2011. Other issues include GERD, asthma, OSA, vertigo, palpitations with benign PVC's noted, HLD, HTN, RBBB (rate related). Last seen in May of 2013. Was doing ok. Labs are checked by Dr. Clelia Croft.   He comes in today. He is here alone. He is doing ok. He is here because of chest tightness. He notes that since January he has had this "tugging" sensation in his chest. It will go thru to his back. Sometimes it lasts for days. No exertional symptoms. Not really able to exercise due to back injuries and left leg issues. No aerobic activity reported. His family history is quite positive for CAD. His twin has stents (but smoked). Ibuprofen has helped some. No other associated symptoms. He is concerned due to his CV risk factors. He has been actively losing weight thru an app on his phone "My Fitness Pal". He is down 22 pounds just with calorie counting.    Current Outpatient Prescriptions on File Prior to Visit  Medication Sig Dispense Refill  . aspirin 81 MG tablet Take 81 mg by mouth daily.        [name] BEPREVE 1.5 % SOLN Place into both eyes as needed. 1.5% one drop in both as need two to four times a day      . cholecalciferol (VITAMIN D) 1000 UNITS tablet Take 1,000 Units by mouth daily.      [name] DYMISTA 137-50 MCG/ACT SUSP One spray each nostril twice a day.       . finasteride (PROSCAR) 5 MG tablet Take 5 mg by mouth Daily.      . fish oil-omega-3 fatty acids 1000 MG capsule 1200 mg daily       . ibuprofen (ADVIL,MOTRIN) 800 MG tablet As needed       . lansoprazole (PREVACID) 30 MG capsule Take 30 mg by mouth Daily.      [name] levalbuterol (XOPENEX HFA) 45 MCG/ACT inhaler Inhale 1-2 puffs into the lungs every 6 (six) hours as needed.      [name] levocetirizine (XYZAL) 5 MG  tablet Take 5 mg by mouth every evening.        . loteprednol (ALREX) 0.2 % SUSP 1 drop 2 (two) times daily.      . metoprolol (TOPROL-XL) 50 MG 24 hr tablet Take 100 mg by mouth daily.       . montelukast (SINGULAIR) 10 MG tablet Take 10 mg by mouth at bedtime.        . rosuvastatin (CRESTOR) 10 MG tablet Take 1/2 tablet daily       . Tamsulosin HCl (FLOMAX) 0.4 MG CAPS Take 0.4 mg by mouth daily.        [name] topiramate (TOPAMAX) 25 MG tablet Take 2 tabs (total 50 mg) po BID  360 tablet  1  . vitamin E (VITAMIN E) 400 UNIT capsule Take 400 Units by mouth daily.      [name] zolpidem (AMBIEN) 10 MG tablet Take 10 mg by mouth at bedtime.      . Mometasone Furo-Formoterol Fum (DULERA IN) Inhale into the lungs 2 (two) times daily.       No current facility-administered medications on file prior to visit.    Allergies  Allergen Reactions  . Floxin (  Ofloxacin)     Past Medical History  Diagnosis Date  . Vertigo   . Sleep apnea   . Palpitations     Benign PVCs  . Dyslipidemia   . GERD (gastroesophageal reflux disease)   . HTN (hypertension)   . Chest pain     Coronaries normal 1996 /  nuclear..06/2002..normal...EF  56% /  stress echo.. May, 2011.... no  scar or ischemia... rate related RBBB  . RBBB (right bundle branch block)     rate related  . Hx of colonoscopy   . Ejection fraction     EF 60%, stress echo, May, 2011  . Carotid artery disease     Carotid Doppler normal August, 2007  . Tremor     Hand tremor    Past Surgical History  Procedure Laterality Date  . Back surgery    . Neck surgery  2002    History  Smoking status  . Never Smoker   Smokeless tobacco  . Never Used    History  Alcohol Use No    Family History  Problem Relation Age of Onset  . Heart attack Father   . Heart disease Father   . Heart disease Brother     Review of Systems: The review of systems is per the HPI.  All other systems were reviewed and are negative.  Physical Exam: BP 120/70   Pulse 73  Resp 20  Ht 6\' 1"  (1.854 m)  Wt 207 lb 12.8 oz (94.257 kg)  BMI 27.42 kg/m2 Patient is very pleasant and in no acute distress. Weight is down 22 pounds. Skin is warm and dry. Color is normal.  HEENT is unremarkable. Normocephalic/atraumatic. PERRL. Sclera are nonicteric. Neck is supple. No masses. No JVD. Lungs are clear. Cardiac exam shows a regular rate and rhythm. Abdomen is soft. Extremities are without edema. Gait and ROM are intact. No gross neurologic deficits noted.  LABORATORY DATA:  EKG today shows sinus rhythm and is normal.   Lab Results  Component Value Date   CREATININE 0.83 07/31/2011    Assessment / Plan:  1. Chest tightness - no known CAD per remote cath (1996)- negative stress echo in 2011 - his symptoms sound atypical but he has HTN and HLD along with a very strong family history. EKG is ok today. We will update his stress test - he is not able to exercise/walk on the treadmill. Will arrange for Lexiscan.   2. HTN - Blood pressure looks ok. No change in current therapy.   3. HLD - on statin therapy - labs are checked by his PCP.  4. Obesity - actively losing weight. 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   Will tentatively see him back in June as planned unless his Myoview is abnormal.   Patient is agreeable to this plan and will call if any problems develop in the interim.   Rosalio Macadamia, RN, ANP-C [facility] [address] [place], [place]  [address]

## 2012-03-31 DIAGNOSIS — M19049 Primary osteoarthritis, unspecified hand: Secondary | ICD-10-CM | POA: Diagnosis not present

## 2012-04-05 ENCOUNTER — Ambulatory Visit (HOSPITAL_COMMUNITY): Payer: Medicare Other | Attending: Cardiovascular Disease | Admitting: Radiology

## 2012-04-05 VITALS — BP 126/79 | Ht 73.0 in | Wt 205.0 lb

## 2012-04-05 DIAGNOSIS — Z8249 Family history of ischemic heart disease and other diseases of the circulatory system: Secondary | ICD-10-CM | POA: Insufficient documentation

## 2012-04-05 DIAGNOSIS — R0602 Shortness of breath: Secondary | ICD-10-CM | POA: Insufficient documentation

## 2012-04-05 DIAGNOSIS — R5383 Other fatigue: Secondary | ICD-10-CM | POA: Insufficient documentation

## 2012-04-05 DIAGNOSIS — R079 Chest pain, unspecified: Secondary | ICD-10-CM | POA: Diagnosis not present

## 2012-04-05 DIAGNOSIS — R002 Palpitations: Secondary | ICD-10-CM | POA: Diagnosis not present

## 2012-04-05 DIAGNOSIS — I4949 Other premature depolarization: Secondary | ICD-10-CM | POA: Diagnosis not present

## 2012-04-05 DIAGNOSIS — I10 Essential (primary) hypertension: Secondary | ICD-10-CM | POA: Insufficient documentation

## 2012-04-05 DIAGNOSIS — R5381 Other malaise: Secondary | ICD-10-CM | POA: Diagnosis not present

## 2012-04-05 DIAGNOSIS — R0609 Other forms of dyspnea: Secondary | ICD-10-CM | POA: Diagnosis not present

## 2012-04-05 DIAGNOSIS — R55 Syncope and collapse: Secondary | ICD-10-CM | POA: Diagnosis not present

## 2012-04-05 DIAGNOSIS — R0989 Other specified symptoms and signs involving the circulatory and respiratory systems: Secondary | ICD-10-CM | POA: Insufficient documentation

## 2012-04-05 MED ORDER — TECHNETIUM TC 99M SESTAMIBI GENERIC - CARDIOLITE
11.0000 | Freq: Once | INTRAVENOUS | Status: AC | PRN
  Administered 2012-04-05: 11 via INTRAVENOUS

## 2012-04-05 MED ORDER — TECHNETIUM TC 99M SESTAMIBI GENERIC - CARDIOLITE
33.0000 | Freq: Once | INTRAVENOUS | Status: AC | PRN
  Administered 2012-04-05: 33 via INTRAVENOUS

## 2012-04-05 MED ORDER — REGADENOSON 0.4 MG/5ML IV SOLN
0.4000 mg | Freq: Once | INTRAVENOUS | Status: AC
  Administered 2012-04-05: 0.4 mg via INTRAVENOUS

## 2012-04-05 NOTE — Progress Notes (Signed)
[facility] SITE 3 NUCLEAR MED [address] [place], [place] [address] [phone]    Cardiology Nuclear Med Study  Joshua [name] is a 67 y.o. male     MRN : [id number]     DOB: Nov 08, 1945  Procedure Date: 04/05/2012  Nuclear Med Background Indication for Stress Test:  Evaluation for Ischemia History:  Asthma;11' Echo;96' Heart Catheterization Nml;;04' Myocardial Perfusion Study Nml EF 56% Cardiac Risk Factors: Carotid Disease, Family History - CAD, Hypertension, Lipids and RBBB  Symptoms:  Chest Pain, DOE, Fatigue, Near Syncope, Palpitations(PVCs), SOB and Chest"tugging/nagging" pain that goes through to back/comes goes   Nuclear Pre-Procedure Caffeine/Decaff Intake:  None > 12 hrs NPO After: 7:00pm   Lungs:  clear O2 Sat: 98% on room air. IV 0.9% NS with Angio Cath:  20g  IV Site: R Antecubital x 1, tolerated well IV Started by:  Irean Hong, RN  Chest Size (in):  44 Cup Size: n/a  Height: 6\' 1"  (1.854 m)  Weight:  205 lb (92.987 kg)  BMI:  Body mass index is 27.05 kg/(m^2). Tech Comments:  Toprol last night    Nuclear Med Study 1 or 2 day study: 1 day  Stress Test Type:  Lexiscan  Reading MD: Kristeen Miss, MD  Order Authorizing Provider:  Willa Rough, MD  Resting Radionuclide: Technetium 61m Sestamibi  Resting Radionuclide Dose: 11.0 mCi   Stress Radionuclide:  Technetium 46m Sestamibi  Stress Radionuclide Dose: 33.0 mCi           Stress Protocol Rest HR: 52 Stress HR: 82  Rest BP: 126/79 Stress BP: 124/73  Exercise Time (min): n/a METS: n/a   Predicted Max HR: 155 bpm % Max HR: 52.9 bpm Rate Pressure Product: 21308   Dose of Adenosine (mg):  n/a Dose of Lexiscan: 0.4 mg  Dose of Atropine (mg): n/a Dose of Dobutamine: n/a mcg/kg/min (at max HR)  Stress Test Technologist: Frederick Peers, EMT-P  Nuclear Technologist:  Domenic Polite, CNMT     Rest Procedure:  Myocardial perfusion imaging was performed at rest 45 minutes following the intravenous  administration of Technetium 95m Sestamibi. Rest ECG: NSR - Normal EKG  Stress Procedure:  The patient received IV Lexiscan 0.4 mg over 15-seconds.  Technetium 40m Sestamibi injected at 30-seconds.  Quantitative spect images were obtained after a 45 minute delay. Stress ECG: No significant change from baseline ECG  QPS Raw Data Images:  Normal; no motion artifact; normal heart/lung ratio. Stress Images:  Normal homogeneous uptake in all areas of the myocardium. Rest Images:  Normal homogeneous uptake in all areas of the myocardium. Subtraction (SDS):  No evidence of ischemia. Transient Ischemic Dilatation (Normal <1.22):  0.98 Lung/Heart Ratio (Normal <0.45):  0.28  Quantitative Gated Spect Images QGS EDV:  115 ml QGS ESV:  48 ml  Impression Exercise Capacity:  Lexiscan with no exercise. BP Response:  Normal blood pressure response. Clinical Symptoms:  No significant symptoms noted. ECG Impression:  No significant ST segment change suggestive of ischemia. Comparison with Prior Nuclear Study: No images to compare  Overall Impression:  Normal stress nuclear study.   No evidence of ischemia.    LV Ejection Fraction: 58%.  LV Wall Motion:  NL LV Function; NL Wall Motion.   Vesta Mixer, Montez Hageman., MD, [facility] 04/05/2012, 5:29 PM Office - [phone] Pager [phone]

## 2012-05-05 DIAGNOSIS — E119 Type 2 diabetes mellitus without complications: Secondary | ICD-10-CM | POA: Diagnosis not present

## 2012-05-05 DIAGNOSIS — E785 Hyperlipidemia, unspecified: Secondary | ICD-10-CM | POA: Diagnosis not present

## 2012-05-05 DIAGNOSIS — Z125 Encounter for screening for malignant neoplasm of prostate: Secondary | ICD-10-CM | POA: Diagnosis not present

## 2012-05-05 DIAGNOSIS — Z0000 Encounter for general adult medical examination without abnormal findings: Secondary | ICD-10-CM | POA: Diagnosis not present

## 2012-05-05 DIAGNOSIS — I10 Essential (primary) hypertension: Secondary | ICD-10-CM | POA: Diagnosis not present

## 2012-05-05 DIAGNOSIS — E739 Lactose intolerance, unspecified: Secondary | ICD-10-CM | POA: Diagnosis not present

## 2012-05-12 DIAGNOSIS — M19049 Primary osteoarthritis, unspecified hand: Secondary | ICD-10-CM | POA: Diagnosis not present

## 2012-05-13 DIAGNOSIS — Z23 Encounter for immunization: Secondary | ICD-10-CM | POA: Diagnosis not present

## 2012-05-13 DIAGNOSIS — I10 Essential (primary) hypertension: Secondary | ICD-10-CM | POA: Diagnosis not present

## 2012-05-13 DIAGNOSIS — Z0000 Encounter for general adult medical examination without abnormal findings: Secondary | ICD-10-CM | POA: Diagnosis not present

## 2012-05-13 DIAGNOSIS — Z1331 Encounter for screening for depression: Secondary | ICD-10-CM | POA: Diagnosis not present

## 2012-05-13 DIAGNOSIS — E785 Hyperlipidemia, unspecified: Secondary | ICD-10-CM | POA: Diagnosis not present

## 2012-05-13 DIAGNOSIS — N401 Enlarged prostate with lower urinary tract symptoms: Secondary | ICD-10-CM | POA: Diagnosis not present

## 2012-05-13 DIAGNOSIS — R7301 Impaired fasting glucose: Secondary | ICD-10-CM | POA: Diagnosis not present

## 2012-05-13 DIAGNOSIS — N138 Other obstructive and reflux uropathy: Secondary | ICD-10-CM | POA: Diagnosis not present

## 2012-05-13 DIAGNOSIS — J45909 Unspecified asthma, uncomplicated: Secondary | ICD-10-CM | POA: Diagnosis not present

## 2012-05-13 DIAGNOSIS — J309 Allergic rhinitis, unspecified: Secondary | ICD-10-CM | POA: Diagnosis not present

## 2012-05-13 DIAGNOSIS — M19049 Primary osteoarthritis, unspecified hand: Secondary | ICD-10-CM | POA: Diagnosis not present

## 2012-06-11 ENCOUNTER — Encounter: Payer: Self-pay | Admitting: Cardiology

## 2012-06-13 ENCOUNTER — Encounter: Payer: Self-pay | Admitting: Cardiology

## 2012-06-13 ENCOUNTER — Ambulatory Visit (INDEPENDENT_AMBULATORY_CARE_PROVIDER_SITE_OTHER): Payer: Medicare Other | Admitting: Cardiology

## 2012-06-13 VITALS — BP 126/74 | HR 49 | Ht 73.0 in | Wt 199.8 lb

## 2012-06-13 DIAGNOSIS — E785 Hyperlipidemia, unspecified: Secondary | ICD-10-CM

## 2012-06-13 DIAGNOSIS — R079 Chest pain, unspecified: Secondary | ICD-10-CM | POA: Diagnosis not present

## 2012-06-13 NOTE — Assessment & Plan Note (Signed)
His lipids are being treated. No further workup. I'll see him back in one year.

## 2012-06-13 NOTE — Patient Instructions (Addendum)
Your physician wants you to follow-up in: 12 months You will receive a reminder letter in the mail two months in advance. If you don't receive a letter, please call our office to schedule the follow-up appointment.   Continue same medication  

## 2012-06-13 NOTE — Assessment & Plan Note (Signed)
The patient is not having any significant recurring chest pain. His recent nuclear scan was stable. No further workup.

## 2012-06-13 NOTE — Progress Notes (Signed)
HPI   Patient is seen today to followup some chest pain. He has no known coronary disease. Recently he was seen in the office with some pain that went through his back. Decision was made to proceed with a nuclear scan. This was done and it showed no significant abnormality. There was no ischemia and wall motion was normal. He's feeling fine now. He has some vague discomfort from time to time but he is comfortable that it's not cardiac.  Over the past several months he decided that he needed to improve his diet. He did this and in fact he has lost 30 pounds. He really looks quite good now.  Allergies  Allergen Reactions  . Floxin (Ofloxacin)     Current Outpatient Prescriptions  Medication Sig Dispense Refill  . aspirin 81 MG tablet Take 81 mg by mouth daily.        [name] BEPREVE 1.5 % SOLN Place into both eyes as needed. 1.5% one drop in both as need two to four times a day      . cholecalciferol (VITAMIN D) 1000 UNITS tablet Take 1,000 Units by mouth daily.      [name] DYMISTA 137-50 MCG/ACT SUSP One spray each nostril twice a day.       . finasteride (PROSCAR) 5 MG tablet Take 5 mg by mouth Daily.      . fish oil-omega-3 fatty acids 1000 MG capsule 1200 mg daily       . ibuprofen (ADVIL,MOTRIN) 800 MG tablet As needed       . lansoprazole (PREVACID) 30 MG capsule Take 30 mg by mouth Daily.      [name] levalbuterol (XOPENEX HFA) 45 MCG/ACT inhaler Inhale 1-2 puffs into the lungs every 6 (six) hours as needed.      [name] levocetirizine (XYZAL) 5 MG tablet Take 5 mg by mouth every evening.        . loteprednol (ALREX) 0.2 % SUSP 1 drop 2 (two) times daily.      . metoprolol (TOPROL-XL) 50 MG 24 hr tablet Take 100 mg by mouth daily.       . montelukast (SINGULAIR) 10 MG tablet Take 10 mg by mouth at bedtime.        . rosuvastatin (CRESTOR) 10 MG tablet Take 1/2 tablet daily       . topiramate (TOPAMAX) 25 MG tablet Take 2 tabs (total 50 mg) po BID  360 tablet  1  . vitamin E (VITAMIN E) 400 UNIT capsule  Take 400 Units by mouth daily.      [name] zolpidem (AMBIEN) 10 MG tablet Take 10 mg by mouth at bedtime.       No current facility-administered medications for this visit.    History   Social History  . Marital Status: Married    Spouse Name: N/A    Number of Children: N/A  . Years of Education: N/A   Occupational History  . Not on file.   Social History Main Topics  . Smoking status: Never Smoker   . Smokeless tobacco: Never Used  . Alcohol Use: No  . Drug Use: No  . Sexually Active: Not on file   Other Topics Concern  . Not on file   Social History Narrative  . No narrative on file    Family History  Problem Relation Age of Onset  . Heart attack Father   . Heart disease Father   . Heart disease Brother     Past Medical History  Diagnosis Date  . Vertigo   . Sleep apnea   . Palpitations     Benign PVCs  . Dyslipidemia   . GERD (gastroesophageal reflux disease)   . HTN (hypertension)   . Chest pain     Coronaries normal 1996 /  nuclear..06/2002..normal...EF  56% /  stress echo.. May, 2011.... no  scar or ischemia... rate related RBBB  . RBBB (right bundle branch block)     rate related  . Hx of colonoscopy   . Ejection fraction     EF 60%, stress echo, May, 2011  . Carotid artery disease     Carotid Doppler normal August, 2007  . Tremor     Hand tremor    Past Surgical History  Procedure Laterality Date  . Back surgery    . Neck surgery  2002    Patient Active Problem List   Diagnosis Date Noted  . Tremor   . Ejection fraction   . Vertigo   . Sleep apnea   . Palpitations   . Dyslipidemia   . GERD (gastroesophageal reflux disease)   . HTN (hypertension)   . Chest pain   . RBBB (right bundle branch block)   . Ejection fraction   . Carotid artery disease     ROS   Patient denies fever, chills, headache, sweats, rash, change in vision, change in hearing, chest pain, cough, nausea or vomiting, urinary symptoms. All other systems are reviewed  and are negative.  PHYSICAL EXAM  Patient is oriented to person time and place. Affect is normal. There is no jugular venous distention. Lungs are clear. Respiratory effort is nonlabored. Cardiac exam reveals S1 and S2. There no clicks or significant murmurs. The abdomen is soft. There is no peripheral edema.  Filed Vitals:   06/13/12 1026  BP: 126/74  Pulse: 49  Height: 6\' 1"  (1.854 m)  Weight: 199 lb 12.8 oz (90.629 kg)     ASSESSMENT & PLAN

## 2012-06-16 DIAGNOSIS — M19049 Primary osteoarthritis, unspecified hand: Secondary | ICD-10-CM | POA: Diagnosis not present

## 2012-06-29 ENCOUNTER — Encounter: Payer: Self-pay | Admitting: Internal Medicine

## 2012-07-07 DIAGNOSIS — K219 Gastro-esophageal reflux disease without esophagitis: Secondary | ICD-10-CM | POA: Diagnosis not present

## 2012-07-07 DIAGNOSIS — R0989 Other specified symptoms and signs involving the circulatory and respiratory systems: Secondary | ICD-10-CM | POA: Diagnosis not present

## 2012-07-07 DIAGNOSIS — I10 Essential (primary) hypertension: Secondary | ICD-10-CM | POA: Diagnosis not present

## 2012-07-07 DIAGNOSIS — J45909 Unspecified asthma, uncomplicated: Secondary | ICD-10-CM | POA: Diagnosis not present

## 2012-07-07 DIAGNOSIS — Z6825 Body mass index (BMI) 25.0-25.9, adult: Secondary | ICD-10-CM | POA: Diagnosis not present

## 2012-07-07 DIAGNOSIS — R0609 Other forms of dyspnea: Secondary | ICD-10-CM | POA: Diagnosis not present

## 2012-07-08 ENCOUNTER — Encounter: Payer: Self-pay | Admitting: Internal Medicine

## 2012-07-08 ENCOUNTER — Other Ambulatory Visit (HOSPITAL_COMMUNITY): Payer: Self-pay | Admitting: Internal Medicine

## 2012-07-08 DIAGNOSIS — R0609 Other forms of dyspnea: Secondary | ICD-10-CM

## 2012-07-08 DIAGNOSIS — R0600 Dyspnea, unspecified: Secondary | ICD-10-CM

## 2012-07-08 NOTE — Telephone Encounter (Signed)
Error

## 2012-07-14 DIAGNOSIS — M19049 Primary osteoarthritis, unspecified hand: Secondary | ICD-10-CM | POA: Diagnosis not present

## 2012-07-20 ENCOUNTER — Ambulatory Visit (HOSPITAL_COMMUNITY)
Admission: RE | Admit: 2012-07-20 | Discharge: 2012-07-20 | Disposition: A | Discharge status: Home / Self Care | Payer: Medicare Other | Source: Ambulatory Visit | Attending: Internal Medicine | Admitting: Internal Medicine

## 2012-07-20 DIAGNOSIS — J45909 Unspecified asthma, uncomplicated: Secondary | ICD-10-CM | POA: Insufficient documentation

## 2012-07-20 DIAGNOSIS — R0989 Other specified symptoms and signs involving the circulatory and respiratory systems: Secondary | ICD-10-CM | POA: Diagnosis not present

## 2012-07-20 DIAGNOSIS — R0609 Other forms of dyspnea: Secondary | ICD-10-CM | POA: Diagnosis not present

## 2012-07-20 MED ORDER — ALBUTEROL SULFATE (5 MG/ML) 0.5% IN NEBU
2.5000 mg | INHALATION_SOLUTION | Freq: Once | RESPIRATORY_TRACT | Status: AC
  Administered 2012-07-20: 2.5 mg via RESPIRATORY_TRACT

## 2012-07-25 DIAGNOSIS — J3089 Other allergic rhinitis: Secondary | ICD-10-CM | POA: Diagnosis not present

## 2012-07-25 DIAGNOSIS — J45909 Unspecified asthma, uncomplicated: Secondary | ICD-10-CM | POA: Diagnosis not present

## 2012-07-25 DIAGNOSIS — H1045 Other chronic allergic conjunctivitis: Secondary | ICD-10-CM | POA: Diagnosis not present

## 2012-07-27 ENCOUNTER — Encounter: Payer: Self-pay | Admitting: Internal Medicine

## 2012-08-03 ENCOUNTER — Ambulatory Visit: Payer: Medicare Other | Admitting: Internal Medicine

## 2012-08-11 DIAGNOSIS — M19049 Primary osteoarthritis, unspecified hand: Secondary | ICD-10-CM | POA: Diagnosis not present

## 2012-08-15 ENCOUNTER — Encounter: Payer: Self-pay | Admitting: Internal Medicine

## 2012-08-15 ENCOUNTER — Ambulatory Visit (INDEPENDENT_AMBULATORY_CARE_PROVIDER_SITE_OTHER): Payer: Medicare Other | Admitting: Internal Medicine

## 2012-08-15 VITALS — BP 118/68 | HR 72 | Ht 72.25 in | Wt 203.1 lb

## 2012-08-15 DIAGNOSIS — K219 Gastro-esophageal reflux disease without esophagitis: Secondary | ICD-10-CM | POA: Diagnosis not present

## 2012-08-15 DIAGNOSIS — Z86010 Personal history of colonic polyps: Secondary | ICD-10-CM

## 2012-08-15 DIAGNOSIS — R1013 Epigastric pain: Secondary | ICD-10-CM

## 2012-08-15 MED ORDER — MOVIPREP 100 G PO SOLR: 1.0000 | Freq: Once | ORAL | Status: DC

## 2012-08-15 NOTE — Progress Notes (Signed)
HISTORY OF PRESENT ILLNESS:  Joshua [name] is a 67 y.o. male with multiple medical problems including hypertension, chronic GERD, and adenomatous colon polyps. He presents today regarding a 3 month history of epigastric pain. Patient reports daily epigastric pain described as a pulling sensation. There is radiation straight through to the back. Symptoms can be exacerbated by meals and activities. He does report 35 pound weight loss over the past year, though he attributes this to diet. Patient states that his discomfort lasts for varying amounts of time. He is also noticed increased belching and bloating sensation. Some sensation of shortness of breath. He recently underwent cardiac evaluation for the symptoms with negative cardiac workup. For GERD he has taken PPI for many years. Off medication significant pyrosis. No active pyrosis currently. He does have chronic stable constipation. He also has a history of an advanced adenoma an index colonoscopy in 2006. His last colonoscopy in 2009 was negative except for diverticulosis and internal hemorrhoids. He is due for surveillance at this time. Actually set up for later this month. His current PPI is lansoprazole 30 mg daily GI review of systems is otherwise negative  REVIEW OF SYSTEMS:  All non-GI ROS negative except for allergies, arthritis, back pain, fatigue, shortness of breath, hoarseness  Past Medical History  Diagnosis Date  . Vertigo   . Sleep apnea     pt denies  . Palpitations     Benign PVCs  . Dyslipidemia   . GERD (gastroesophageal reflux disease)   . HTN (hypertension)   . Chest pain     Coronaries normal 1996 /  nuclear..06/2002..normal...EF  56% /  stress echo.. May, 2011.... no  scar or ischemia... rate related RBBB  . RBBB (right bundle branch block)     rate related  . Hx of colonoscopy   . Ejection fraction     EF 60%, stress echo, May, 2011  . Carotid artery disease     Carotid Doppler normal August, 2007  . Tremor      Hand tremor  . Diverticulosis   . Shingles   . IFG (impaired fasting glucose)   . Allergic rhinitis   . Hx of colonic polyps     adenomatous  . Asthma   . Anal fissure   . Arthritis     Past Surgical History  Procedure Laterality Date  . Back surgery  2002,2009    x 6  . Neck surgery  2002  . Hernia repair      laprascopic  . Rotator cuff repair Left     Social History Joshua [name]  reports that he has never smoked. He has never used smokeless tobacco. He reports that he does not drink alcohol or use illicit drugs.  family history includes Breast cancer in his paternal grandmother; Clotting disorder in his father; Heart attack in his father and paternal grandfather; Heart disease in his brother and father; and Hypertension in his mother.  Allergies  Allergen Reactions  . Floxin (Ofloxacin)        PHYSICAL EXAMINATION: Vital signs: BP 118/68  Pulse 72  Ht 6' 0.25" (1.835 m)  Wt 203 lb 2 oz (92.137 kg)  BMI 27.36 kg/m2  Constitutional: generally well-appearing, no acute distress Psychiatric: alert and oriented x3, cooperative Eyes: extraocular movements intact, anicteric, conjunctiva pink Mouth: oral pharynx moist, no lesions Neck: supple no lymphadenopathy Cardiovascular: heart regular rate and rhythm, no murmur Lungs: clear to auscultation bilaterally Abdomen: soft, nontender, nondistended, no obvious ascites, no peritoneal signs,  normal bowel sounds, no organomegaly Rectal: Deferred until colonoscopy Extremities: no lower extremity edema bilaterally Skin: no lesions on visible extremities Neuro: No focal deficits. No asterixis.    ASSESSMENT:  #1. Chronic epigastric discomfort with radiation into the back. This despite daily PPI. Possible GI etiologies include gallbladder disease, breakthrough reflux, ulcer, or pancreas. #2. GERD. No classic symptoms on PPI #3. History of advanced adenoma 2006. Negative exam for neoplasia 2009. Due for  surveillance   PLAN:  #1. Abdominal ultrasound to evaluate pain #2. Upper endoscopy to evaluate pain.The nature of the procedure, as well as the risks, benefits, and alternatives were carefully and thoroughly reviewed with the patient. Ample time for discussion and questions allowed. The patient understood, was satisfied, and agreed to proceed. #3. Surveillance colonoscopy. Due.The nature of the procedure, as well as the risks, benefits, and alternatives were carefully and thoroughly reviewed with the patient. Ample time for discussion and questions allowed. The patient understood, was satisfied, and agreed to proceed. Movi prep prescribed. The patient instructed on its use #4. Consider increasing PPI to twice a day post EGD #5. If above workup negative, then CT scan of the abdomen with fine cuts through the pancreas

## 2012-08-15 NOTE — Patient Instructions (Addendum)
You have been scheduled for an abdominal ultrasound at [facility] (1st floor of hospital) on 08-18-12 at 9:00am. Please arrive 15 minutes prior to your appointment for registration. Make certain not to have anything to eat or drink after midnight prior to your appointment. Should you need to reschedule your appointment, please contact radiology at [phone]. This test typically takes about 30 minutes to perform.  You have been scheduled for an endoscopy and colonoscopy with propofol. Please follow the written instructions given to you at your visit today. Please pick up your prep at the pharmacy within the next 1-3 days. If you use inhalers (even only as needed), please bring them with you on the day of your procedure. Your physician has requested that you go to www.startemmi.com and enter the access code given to you at your visit today. This web site gives a general overview about your procedure. However, you should still follow specific instructions given to you by our office regarding your preparation for the procedure.

## 2012-08-17 ENCOUNTER — Other Ambulatory Visit: Payer: Self-pay

## 2012-08-18 ENCOUNTER — Ambulatory Visit (HOSPITAL_COMMUNITY)
Admission: RE | Admit: 2012-08-18 | Discharge: 2012-08-18 | Disposition: A | Discharge status: Home / Self Care | Payer: Medicare Other | Source: Ambulatory Visit | Attending: Internal Medicine | Admitting: Internal Medicine

## 2012-08-18 DIAGNOSIS — Z86010 Personal history of colonic polyps: Secondary | ICD-10-CM

## 2012-08-18 DIAGNOSIS — R1013 Epigastric pain: Secondary | ICD-10-CM | POA: Diagnosis not present

## 2012-08-18 DIAGNOSIS — K8020 Calculus of gallbladder without cholecystitis without obstruction: Secondary | ICD-10-CM | POA: Insufficient documentation

## 2012-08-18 DIAGNOSIS — K219 Gastro-esophageal reflux disease without esophagitis: Secondary | ICD-10-CM

## 2012-09-06 ENCOUNTER — Encounter: Payer: Medicare Other | Admitting: Internal Medicine

## 2012-09-06 ENCOUNTER — Other Ambulatory Visit: Payer: Self-pay | Admitting: Internal Medicine

## 2012-09-06 ENCOUNTER — Encounter: Payer: Self-pay | Admitting: Internal Medicine

## 2012-09-06 ENCOUNTER — Other Ambulatory Visit (INDEPENDENT_AMBULATORY_CARE_PROVIDER_SITE_OTHER): Payer: Medicare Other

## 2012-09-06 ENCOUNTER — Ambulatory Visit (AMBULATORY_SURGERY_CENTER): Payer: Medicare Other | Admitting: Internal Medicine

## 2012-09-06 VITALS — BP 123/75 | HR 54 | Temp 97.3°F | Resp 15 | Ht 72.25 in | Wt 203.0 lb

## 2012-09-06 DIAGNOSIS — D126 Benign neoplasm of colon, unspecified: Secondary | ICD-10-CM

## 2012-09-06 DIAGNOSIS — I10 Essential (primary) hypertension: Secondary | ICD-10-CM | POA: Diagnosis not present

## 2012-09-06 DIAGNOSIS — G4733 Obstructive sleep apnea (adult) (pediatric): Secondary | ICD-10-CM | POA: Diagnosis not present

## 2012-09-06 DIAGNOSIS — R1013 Epigastric pain: Secondary | ICD-10-CM

## 2012-09-06 DIAGNOSIS — K2270 Barrett's esophagus without dysplasia: Secondary | ICD-10-CM | POA: Diagnosis not present

## 2012-09-06 DIAGNOSIS — I2510 Atherosclerotic heart disease of native coronary artery without angina pectoris: Secondary | ICD-10-CM | POA: Diagnosis not present

## 2012-09-06 DIAGNOSIS — R109 Unspecified abdominal pain: Secondary | ICD-10-CM | POA: Diagnosis not present

## 2012-09-06 DIAGNOSIS — Z86010 Personal history of colonic polyps: Secondary | ICD-10-CM | POA: Diagnosis not present

## 2012-09-06 DIAGNOSIS — K2970 Gastritis, unspecified, without bleeding: Secondary | ICD-10-CM | POA: Diagnosis not present

## 2012-09-06 DIAGNOSIS — K219 Gastro-esophageal reflux disease without esophagitis: Secondary | ICD-10-CM | POA: Diagnosis not present

## 2012-09-06 LAB — COMPREHENSIVE METABOLIC PANEL
ALT: 17 U/L (ref 0–53)
AST: 20 U/L (ref 0–37)
Albumin: 4.1 g/dL (ref 3.5–5.2)
Alkaline Phosphatase: 42 U/L (ref 39–117)
BUN: 12 mg/dL (ref 6–23)
CO2: 24 mEq/L (ref 19–32)
Calcium: 9.2 mg/dL (ref 8.4–10.5)
Chloride: 107 mEq/L (ref 96–112)
Creatinine, Ser: 0.8 mg/dL (ref 0.4–1.5)
GFR: 99.95 mL/min (ref >60.00)
Glucose, Bld: 80 mg/dL (ref 70–99)
Potassium: 4.6 mEq/L (ref 3.5–5.1)
Sodium: 140 mEq/L (ref 135–145)
Total Bilirubin: 1.3 mg/dL — ABNORMAL HIGH (ref 0.3–1.2)
Total Protein: 6.8 g/dL (ref 6.0–8.3)

## 2012-09-06 MED ORDER — SODIUM CHLORIDE 0.9 % IV SOLN: 500.0000 mL | INTRAVENOUS | Status: DC

## 2012-09-06 NOTE — Progress Notes (Signed)
Called to room to assist during endoscopic procedure.  Patient ID and intended procedure confirmed with present staff. Received instructions for my participation in the procedure from the performing physician.  

## 2012-09-06 NOTE — Op Note (Signed)
[facility] [address] [place], [address]   ENDOSCOPY PROCEDURE REPORT  PATIENT: Joshua, [name]  MR#: [id number] BIRTHDATE: Jul 01, 1945 , 66  yrs. old GENDER: Male ENDOSCOPIST: Roxy Cedar, MD REFERRED BY:  Kari Baars, M.D. PROCEDURE DATE:  09/06/2012 PROCEDURE:  EGD w/ biopsy ASA CLASS:     Class II INDICATIONS:  Epigastric pain. MEDICATIONS: MAC sedation, administered by CRNA and propofol (Diprivan) 200mg  IV TOPICAL ANESTHETIC: Cetacaine Spray  DESCRIPTION OF PROCEDURE: After the risks benefits and alternatives of the procedure were thoroughly explained, informed consent was obtained.  The LB [id number] endoscope was introduced through the mouth and advanced to the second portion of the duodenum. Without limitations.  The instrument was slowly withdrawn as the mucosa was fully examined.    EXAM: 3 cm tongue of Barrett's type mucosa distal esophagus.Biopsies taken.  Multiple fundic gland type polyps in the proximal half of the stomach.  Normal duodenum.  Retroflexed views revealed no abnormalities.     The scope was then withdrawn from the patient and the procedure completed.  COMPLICATIONS: There were no complications. ENDOSCOPIC IMPRESSION: 1. GERD with short segment of Barrett's type mucosa 2. Incidental fundic gland type polyps (as seen with PPI use). 3. No cause for pain found 4. Gallstones on Ultrasound  RECOMMENDATIONS: 1.  Continue PPI 2.  Await biopsy results 3.  Contrast enhanced CT Scan of the abdomen with fine cuts through the pancreas "epigastric pain". May office will arrange. 4. If CT negative, surgical referal for possible Lap-chole  REPEAT EXAM:  eSigned:  Roxy Cedar, MD 09/06/2012 3:44 PM   CC:W.  Buren Kos, MD and The Patient

## 2012-09-06 NOTE — Progress Notes (Signed)
Patient did not experience any of the following events: a burn prior to discharge; a fall within the facility; wrong site/side/patient/procedure/implant event; or a hospital transfer or hospital admission upon discharge from the facility. (G8907) Patient did not have preoperative order for IV antibiotic SSI prophylaxis. (G8918)  

## 2012-09-06 NOTE — Op Note (Signed)
[facility] [address] [place], [address]   COLONOSCOPY PROCEDURE REPORT  PATIENT: [name], [name]  MR#: [id number] BIRTHDATE: 05/19/1945 , 66  yrs. old GENDER: Male ENDOSCOPIST: Roxy Cedar, MD REFERRED [id number] Program Recall PROCEDURE DATE:  09/06/2012 PROCEDURE:   Colonoscopy with snare polypectomy x 1 First Screening Colonoscopy - Avg.  risk and is 50 yrs.  old or older - No.  Prior Negative Screening - Now for repeat screening. N/A  History of Adenoma - Now for follow-up colonoscopy & has been > or = to 3 yrs.  Yes hx of adenoma.  Has been 3 or more years since last colonoscopy.  Polyps Removed Today? Yes. ASA CLASS:   Class II INDICATIONS:Patient's personal history of adenomatous colon polyps. Index 2006 (advanced adenoma); 2009 (negative) MEDICATIONS: MAC sedation, administered by CRNA and propofol (Diprivan) 350mg  IV  DESCRIPTION OF PROCEDURE:   After the risks benefits and alternatives of the procedure were thoroughly explained, informed consent was obtained.  A digital rectal exam revealed no abnormalities of the rectum.   The LB [id number]  endoscope was introduced through the anus and advanced to the cecum, which was identified by both the appendix and ileocecal valve. No adverse events experienced.   The quality of the prep was excellent, using MoviPrep  The instrument was then slowly withdrawn as the colon was fully examined.   COLON FINDINGS: A diminutive polyp was found in the transverse colon.  A polypectomy was performed with a cold snare.  The resection was complete and the polyp tissue was completely retrieved.   Moderate diverticulosis was noted The finding was in the left colon.   The colon mucosa was otherwise normal. Retroflexed views revealed internal hemorrhoids. The time to cecum=3 minutes 0 seconds.  Withdrawal time=11 minutes 27 seconds. The scope was withdrawn and the procedure completed. COMPLICATIONS: There  were no complications.  ENDOSCOPIC IMPRESSION: 1.   Diminutive polyp was found in the transverse colon; polypectomy was performed with a cold snare 2.   Moderate diverticulosis was noted in the left colon 3.   The colon mucosa was otherwise normal  RECOMMENDATIONS: 1.  Follow up colonoscopy in 5 years 2.  Upper endoscopy today (see report)   eSigned:  Roxy Cedar, MD 09/06/2012 3:36 PM   cc: Kari Baars, MD and The Patient   PATIENT NAME:  Joshua [name], Joshua [name] MR#: [id number]

## 2012-09-06 NOTE — Patient Instructions (Addendum)
Discharge instructions given with verbal understanding. Handouts on polyps and diverticulosis. Contrasts given in Recovery Room. Sent to lab on discharge. Resume previous medications. YOU HAD AN ENDOSCOPIC PROCEDURE TODAY AT THE [facility]: Refer to the procedure report that was given to you for any specific questions about what was found during the examination.  If the procedure report does not answer your questions, please call your gastroenterologist to clarify.  If you requested that your care partner not be given the details of your procedure findings, then the procedure report has been included in a sealed envelope for you to review at your convenience later.  YOU SHOULD EXPECT: Some feelings of bloating in the abdomen. Passage of more gas than usual.  Walking can help get rid of the air that was put into your GI tract during the procedure and reduce the bloating. If you had a lower endoscopy (such as a colonoscopy or flexible sigmoidoscopy) you may notice spotting of blood in your stool or on the toilet paper. If you underwent a bowel prep for your procedure, then you may not have a normal bowel movement for a few days.  DIET: Your first meal following the procedure should be a light meal and then it is ok to progress to your normal diet.  A half-sandwich or bowl of soup is an example of a good first meal.  Heavy or fried foods are harder to digest and may make you feel nauseous or bloated.  Likewise meals heavy in dairy and vegetables can cause extra gas to form and this can also increase the bloating.  Drink plenty of fluids but you should avoid alcoholic beverages for 24 hours.  ACTIVITY: Your care partner should take you home directly after the procedure.  You should plan to take it easy, moving slowly for the rest of the day.  You can resume normal activity the day after the procedure however you should NOT DRIVE or use heavy machinery for 24 hours (because of the sedation  medicines used during the test).    SYMPTOMS TO REPORT IMMEDIATELY: A gastroenterologist can be reached at any hour.  During normal business hours, 8:30 AM to 5:00 PM Monday through Friday, call [phone].  After hours and on weekends, please call the GI answering service at [phone] who will take a message and have the physician on call contact you.   Following lower endoscopy (colonoscopy or flexible sigmoidoscopy):  Excessive amounts of blood in the stool  Significant tenderness or worsening of abdominal pains  Swelling of the abdomen that is new, acute  Fever of 100F or higher  Following upper endoscopy (EGD)  Vomiting of blood or coffee ground material  New chest pain or pain under the shoulder blades  Painful or persistently difficult swallowing  New shortness of breath  Fever of 100F or higher  Black, tarry-looking stools  FOLLOW UP: If any biopsies were taken you will be contacted by phone or by letter within the next 1-3 weeks.  Call your gastroenterologist if you have not heard about the biopsies in 3 weeks.  Our staff will call the home number listed on your records the next business day following your procedure to check on you and address any questions or concerns that you may have at that time regarding the information given to you following your procedure. This is a courtesy call and so if there is no answer at the home number and we have not heard from you through the  emergency physician on call, we will assume that you have returned to your regular daily activities without incident.  SIGNATURES/CONFIDENTIALITY: You and/or your care partner have signed paperwork which will be entered into your electronic medical record.  These signatures attest to the fact that that the information above on your After Visit Summary has been reviewed and is understood.  Full responsibility of the confidentiality of this discharge information lies with you and/or your care-partner.

## 2012-09-07 ENCOUNTER — Telehealth: Payer: Self-pay | Admitting: *Deleted

## 2012-09-07 NOTE — Telephone Encounter (Signed)
  Follow up Call-  Call back number 09/06/2012  Post procedure Call Back phone  # [phone]  Permission to leave phone message Yes     Patient questions:  Do you have a fever, pain , or abdominal swelling? no Pain Score  0 *  Have you tolerated food without any problems? yes  Have you been able to return to your normal activities? yes  Do you have any questions about your discharge instructions: Diet   no Medications  no Follow up visit  no  Do you have questions or concerns about your Care? no  Actions: * If pain score is 4 or above: No action needed, pain <4.

## 2012-09-08 ENCOUNTER — Ambulatory Visit (INDEPENDENT_AMBULATORY_CARE_PROVIDER_SITE_OTHER)
Admission: RE | Admit: 2012-09-08 | Discharge: 2012-09-08 | Disposition: A | Discharge status: Home / Self Care | Payer: Medicare Other | Source: Ambulatory Visit | Attending: Internal Medicine | Admitting: Internal Medicine

## 2012-09-08 ENCOUNTER — Other Ambulatory Visit: Payer: Self-pay | Admitting: Internal Medicine

## 2012-09-08 DIAGNOSIS — R1013 Epigastric pain: Secondary | ICD-10-CM | POA: Diagnosis not present

## 2012-09-08 DIAGNOSIS — K8020 Calculus of gallbladder without cholecystitis without obstruction: Secondary | ICD-10-CM

## 2012-09-08 MED ORDER — IOHEXOL 300 MG/ML  SOLN
100.0000 mL | Freq: Once | INTRAMUSCULAR | Status: AC | PRN
  Administered 2012-09-08: 100 mL via INTRAVENOUS

## 2012-09-15 ENCOUNTER — Telehealth: Payer: Self-pay

## 2012-09-15 NOTE — Telephone Encounter (Signed)
Please increase to prevacid 30 mg bid until  Further notice. Give him a new script please. Thanks.

## 2012-09-15 NOTE — Telephone Encounter (Signed)
Pt aware. Pt states he is currently taking Prevacid 30mg  daily.

## 2012-09-15 NOTE — Telephone Encounter (Signed)
Message copied by Chrystie Nose on Thu Sep 15, 2012  2:46 PM ------      Message from: Hilarie Fredrickson      Created: Thu Sep 15, 2012 12:47 PM      Regarding: Barrett's / Meds ? Follow up       Bonita Quin,      See what PPI he is taking including dose and frequency. Low grade dysplasia, but no cancer on esophagus biopsies. I may need to adjust his medication. He will need EGD in one year ([facility] will put in recall). I will send him letters on results from both of his procedures. Thanks . Dr Marina Goodell  ------

## 2012-09-16 ENCOUNTER — Encounter: Payer: Self-pay | Admitting: Internal Medicine

## 2012-09-16 MED ORDER — LANSOPRAZOLE 30 MG PO CPDR: 30.0000 mg | DELAYED_RELEASE_CAPSULE | Freq: Two times a day (BID) | ORAL | Status: DC

## 2012-09-16 NOTE — Telephone Encounter (Signed)
Pt aware and script sent to pharmacy. 

## 2012-09-19 ENCOUNTER — Telehealth: Payer: Self-pay

## 2012-09-19 MED ORDER — LANSOPRAZOLE 30 MG PO CPDR: 30.0000 mg | DELAYED_RELEASE_CAPSULE | Freq: Two times a day (BID) | ORAL | Status: DC

## 2012-09-19 NOTE — Telephone Encounter (Signed)
Refilled Prevacid

## 2012-09-28 ENCOUNTER — Ambulatory Visit (INDEPENDENT_AMBULATORY_CARE_PROVIDER_SITE_OTHER): Payer: Medicare Other | Admitting: General Surgery

## 2012-09-28 DIAGNOSIS — M4716 Other spondylosis with myelopathy, lumbar region: Secondary | ICD-10-CM | POA: Diagnosis not present

## 2012-10-06 ENCOUNTER — Ambulatory Visit (INDEPENDENT_AMBULATORY_CARE_PROVIDER_SITE_OTHER): Payer: Medicare Other | Admitting: General Surgery

## 2012-10-06 ENCOUNTER — Encounter (INDEPENDENT_AMBULATORY_CARE_PROVIDER_SITE_OTHER): Payer: Self-pay | Admitting: General Surgery

## 2012-10-06 VITALS — BP 130/84 | HR 64 | Temp 97.8°F | Resp 14 | Ht 73.0 in | Wt 203.4 lb

## 2012-10-06 DIAGNOSIS — K8010 Calculus of gallbladder with chronic cholecystitis without obstruction: Secondary | ICD-10-CM

## 2012-10-06 NOTE — Progress Notes (Signed)
Subjective:    Abdominal pain, gallstones  Patient ID: Joshua [name], male   DOB: Oct 31, 1945, 67 y.o.   MRN: [id number]  HPI Patient is a very pleasant 67 year old male referred by Dr. Yancey Flemings for apparent symptomatic cholelithiasis. The patient states that for almost a year he has had some gradually worsening episodes of epigastric abdominal pain. He describes pressure-like pain in his epigastrium and under his lower sternum that radiates straight through to his upper back. These initially were in frequent but as the muscle gone by they have become more frequent to where they are almost daily during some periods. They are not reliably related to eating but heavy meals or greasy foods do seem to cause episodes. They will last for a few minutes to an hour or 2 and then resolve spontaneously. To date he has had a thorough workup. Gallbladder ultrasound has shown multiple shadowing gallstones with no gallbladder wall thickening and normal common bile duct. He has had an upper endoscopy by Dr. Marina Goodell showing Barrett's esophagus with mild dysplasia. Colonoscopy was negative except for a benign polyp in the transverse colon. CT scan of the abdomen and pelvis has been obtained which is unremarkable. Patient's bowel movements are normal. No nausea or vomiting. No melena or hematochezia. No fever chills or jaundice. He has also had a cardiac workup related to this pain which was negative with a nuclear medicine scan.  Past Medical History  Diagnosis Date  . Vertigo   . Sleep apnea     pt denies  . Palpitations     Benign PVCs  . Dyslipidemia   . GERD (gastroesophageal reflux disease)   . HTN (hypertension)   . Chest pain     Coronaries normal 1996 /  nuclear..06/2002..normal...EF  56% /  stress echo.. May, 2011.... no  scar or ischemia... rate related RBBB  . RBBB (right bundle branch block)     rate related  . Hx of colonoscopy   . Ejection fraction     EF 60%, stress echo, May, 2011  . Carotid artery  disease     Carotid Doppler normal August, 2007  . Tremor     Hand tremor  . Diverticulosis   . Shingles   . IFG (impaired fasting glucose)   . Allergic rhinitis   . Hx of colonic polyps     adenomatous  . Asthma   . Anal fissure   . Arthritis    Past Surgical History  Procedure Laterality Date  . Back surgery  2002,2009    x 6  . Neck surgery  2002  . Hernia repair      laprascopic  . Rotator cuff repair Left    Current Outpatient Prescriptions  Medication Sig Dispense Refill  . albuterol (PROVENTIL HFA;VENTOLIN HFA) 108 (90 BASE) MCG/ACT inhaler Inhale 2 puffs into the lungs as needed for wheezing.      [name] aspirin 81 MG tablet Take 81 mg by mouth daily.        [name] BEPREVE 1.5 % SOLN Place into both eyes as needed. 1.5% one drop in both as need two to four times a day      . DYMISTA 137-50 MCG/ACT SUSP One spray each nostril twice a day.       . finasteride (PROSCAR) 5 MG tablet Take 5 mg by mouth Daily.      . fish oil-omega-3 fatty acids 1000 MG capsule 1200 mg daily       . ibuprofen (ADVIL,MOTRIN) 800  MG tablet As needed       . lansoprazole (PREVACID) 30 MG capsule Take 1 capsule (30 mg total) by mouth 2 (two) times daily.  180 capsule  1  . levocetirizine (XYZAL) 5 MG tablet Take 5 mg by mouth every evening.        . loteprednol (ALREX) 0.2 % SUSP 1 drop 2 (two) times daily.      . metoprolol (TOPROL-XL) 50 MG 24 hr tablet Take 100 mg by mouth daily.       . montelukast (SINGULAIR) 10 MG tablet Take 10 mg by mouth at bedtime.        . rosuvastatin (CRESTOR) 10 MG tablet Take 1/2 tablet daily      . silodosin (RAPAFLO) 8 MG CAPS capsule Take 8 mg by mouth daily.      [name] topiramate (TOPAMAX) 25 MG tablet Take 2 tabs (total 50 mg) po BID  360 tablet  1  . zolpidem (AMBIEN) 10 MG tablet Take 10 mg by mouth at bedtime.       No current facility-administered medications for this visit.   Allergies  Allergen Reactions  . Floxin [Ofloxacin]    History  Substance Use Topics   . Smoking status: Never Smoker   . Smokeless tobacco: Never Used  . Alcohol Use: No     Review of Systems  Constitutional: Negative.   HENT: Negative.   Respiratory: Negative.   Cardiovascular: Negative.   Gastrointestinal: Positive for abdominal pain. Negative for nausea, vomiting, diarrhea, constipation and blood in stool.  Genitourinary: Negative.   Musculoskeletal: Positive for back pain.  Psychiatric/Behavioral: Negative.        Objective:   Physical Exam BP 130/84  Pulse 64  Temp(Src) 97.8 F (36.6 C) (Temporal)  Resp 14  Ht 6\' 1"  (1.854 m)  Wt 203 lb 6.4 oz (92.262 kg)  BMI 26.84 kg/m2 General: Alert, well-developed Caucasian male, in no distress Skin: Warm and dry without rash or infection. HEENT: No palpable masses or thyromegaly. Sclera nonicteric. Pupils equal round and reactive. Oropharynx clear. Lymph nodes: No cervical, supraclavicular, or inguinal nodes palpable. Lungs: Breath sounds clear and equal without increased work of breathing Cardiovascular: Regular rate and rhythm without murmur. No JVD or edema. Peripheral pulses intact. Abdomen: Nondistended. Soft and nontender. No masses palpable. No organomegaly. No palpable hernias. Extremities: No edema or joint swelling or deformity. No chronic venous stasis changes. Neurologic: Alert and fully oriented. Gait normal.    Assessment:     Persistent and recurrent epigastric abdominal pain entirely consistent with biliary colic an extensive workup that is negative other than gallstones. I have recommended proceeding with laparoscopic cholecystectomy with cholangiogram to relieve his symptoms and prevent future complications from his gallstones.I discussed the procedure in detail.  The patient was given Agricultural engineer.  We discussed the risks and benefits of a laparoscopic cholecystectomy and possible cholangiogram including, but not limited to bleeding, infection, injury to surrounding structures such as the  intestine or liver, bile leak, retained gallstones, need to convert to an open procedure, prolonged diarrhea, blood clots such as  DVT, common bile duct injury, anesthesia risks, and possible need for additional procedures.  The likelihood of improvement in symptoms and return to the patient's normal status is good. We discussed the typical post-operative recovery course.    Plan:     Laparoscopic cholecystectomy with intraoperative cholangiogram as an outpatient under general anesthesia. He is going to the beach for a month in October and would  like to have the surgery in November which I think is reasonable.

## 2012-10-06 NOTE — Patient Instructions (Signed)

## 2012-10-26 DIAGNOSIS — Z23 Encounter for immunization: Secondary | ICD-10-CM | POA: Diagnosis not present

## 2012-11-17 ENCOUNTER — Other Ambulatory Visit: Payer: Self-pay

## 2012-11-30 ENCOUNTER — Other Ambulatory Visit (INDEPENDENT_AMBULATORY_CARE_PROVIDER_SITE_OTHER): Payer: Self-pay | Admitting: General Surgery

## 2012-11-30 ENCOUNTER — Other Ambulatory Visit (INDEPENDENT_AMBULATORY_CARE_PROVIDER_SITE_OTHER): Payer: Self-pay | Admitting: *Deleted

## 2012-11-30 DIAGNOSIS — K811 Chronic cholecystitis: Secondary | ICD-10-CM

## 2012-11-30 DIAGNOSIS — K8010 Calculus of gallbladder with chronic cholecystitis without obstruction: Secondary | ICD-10-CM | POA: Diagnosis not present

## 2012-11-30 HISTORY — PX: CHOLECYSTECTOMY: SHX55

## 2012-11-30 MED ORDER — OXYCODONE-ACETAMINOPHEN 5-325 MG PO TABS: 1.0000 | ORAL_TABLET | ORAL | Status: DC | PRN

## 2012-12-06 ENCOUNTER — Telehealth (INDEPENDENT_AMBULATORY_CARE_PROVIDER_SITE_OTHER): Payer: Self-pay

## 2012-12-06 NOTE — Telephone Encounter (Signed)
Message copied by Maryan Puls on Tue Dec 06, 2012 11:05 AM ------      Message from: Fatima Sanger      Created: Mon Dec 05, 2012  9:07 AM      Regarding: RE: PO appt       Pt is going to be out of town from 12/5-12/7, let me know your next best thing :)            ----- Message -----         From: Maryan Puls, CMA         Sent: 12/02/2012   4:06 PM           To: Fatima Sanger      Subject: PO appt                                                  Post op appt scheduled for 12/16/12 @ 9:30 am w/Dr. Elyn Peers      ----- Message -----         From: Fatima Sanger         Sent: 12/02/2012  11:56 AM           To: Maryan Puls, CMA            Pt needs a postop with Dr. Johna Sheriff.            Thanks!             ------

## 2012-12-06 NOTE — Telephone Encounter (Signed)
Called and spoke to patient regarding his post op appt.  PO appt has been r/s per patient request to 12/29/12 @ 9:25am w/Dr. Johna Sheriff

## 2012-12-16 ENCOUNTER — Encounter (INDEPENDENT_AMBULATORY_CARE_PROVIDER_SITE_OTHER): Payer: Medicare Other | Admitting: General Surgery

## 2012-12-16 ENCOUNTER — Encounter (INDEPENDENT_AMBULATORY_CARE_PROVIDER_SITE_OTHER): Payer: Medicare Other | Admitting: Surgery

## 2012-12-29 ENCOUNTER — Encounter (INDEPENDENT_AMBULATORY_CARE_PROVIDER_SITE_OTHER): Payer: Self-pay | Admitting: General Surgery

## 2012-12-29 ENCOUNTER — Ambulatory Visit (INDEPENDENT_AMBULATORY_CARE_PROVIDER_SITE_OTHER): Payer: Medicare Other | Admitting: General Surgery

## 2012-12-29 VITALS — BP 122/78 | HR 66 | Temp 97.4°F | Resp 16 | Ht 73.0 in | Wt 202.8 lb

## 2012-12-29 DIAGNOSIS — Z09 Encounter for follow-up examination after completed treatment for conditions other than malignant neoplasm: Secondary | ICD-10-CM

## 2012-12-29 NOTE — Progress Notes (Signed)
Chief complaint: Followup cholecystectomy  History: Patient returns for followup approximately 3 weeks after laparoscopic cholecystectomy. He reports he is doing very well. His preoperative pain has been relieved. He denies any GI complaints or other concerns.  Exam: BP 122/78  Pulse 66  Temp(Src) 97.4 F (36.3 C) (Temporal)  Resp 16  Ht 6\' 1"  (1.854 m)  Wt 202 lb 12.8 oz (91.989 kg)  BMI 26.76 kg/m2 General: Appears well Abdomen: Soft and nontender. Wounds well healed.  Pathology showed chronic cholecystitis although interestingly no stones were seen. He had multiple stones on his preoperative ultrasound I suspect that these were somehow lost and processing the specimen.  Assessment and plan: Doing well following laparoscopic cholecystectomy symptom relief and no complications. He is discharged to return as needed.

## 2013-03-30 DIAGNOSIS — M4716 Other spondylosis with myelopathy, lumbar region: Secondary | ICD-10-CM | POA: Diagnosis not present

## 2013-04-03 ENCOUNTER — Other Ambulatory Visit: Payer: Self-pay | Admitting: Internal Medicine

## 2013-04-04 ENCOUNTER — Other Ambulatory Visit: Payer: Self-pay | Admitting: Neurosurgery

## 2013-04-04 DIAGNOSIS — M4716 Other spondylosis with myelopathy, lumbar region: Secondary | ICD-10-CM

## 2013-04-14 ENCOUNTER — Ambulatory Visit
Admission: RE | Admit: 2013-04-14 | Discharge: 2013-04-14 | Disposition: A | Discharge status: Home / Self Care | Payer: Medicare Other | Source: Ambulatory Visit | Attending: Neurosurgery | Admitting: Neurosurgery

## 2013-04-14 ENCOUNTER — Other Ambulatory Visit: Payer: Self-pay | Admitting: Neurosurgery

## 2013-04-14 DIAGNOSIS — M47812 Spondylosis without myelopathy or radiculopathy, cervical region: Secondary | ICD-10-CM | POA: Diagnosis not present

## 2013-04-14 DIAGNOSIS — M542 Cervicalgia: Secondary | ICD-10-CM

## 2013-04-15 ENCOUNTER — Ambulatory Visit
Admission: RE | Admit: 2013-04-15 | Discharge: 2013-04-15 | Disposition: A | Discharge status: Home / Self Care | Payer: Medicare Other | Source: Ambulatory Visit | Attending: Neurosurgery | Admitting: Neurosurgery

## 2013-04-15 DIAGNOSIS — M5126 Other intervertebral disc displacement, lumbar region: Secondary | ICD-10-CM | POA: Diagnosis not present

## 2013-04-15 DIAGNOSIS — M4716 Other spondylosis with myelopathy, lumbar region: Secondary | ICD-10-CM

## 2013-04-15 DIAGNOSIS — M5137 Other intervertebral disc degeneration, lumbosacral region: Secondary | ICD-10-CM | POA: Diagnosis not present

## 2013-05-10 DIAGNOSIS — I10 Essential (primary) hypertension: Secondary | ICD-10-CM | POA: Diagnosis not present

## 2013-05-10 DIAGNOSIS — R82998 Other abnormal findings in urine: Secondary | ICD-10-CM | POA: Diagnosis not present

## 2013-05-10 DIAGNOSIS — E785 Hyperlipidemia, unspecified: Secondary | ICD-10-CM | POA: Diagnosis not present

## 2013-05-10 DIAGNOSIS — R7301 Impaired fasting glucose: Secondary | ICD-10-CM | POA: Diagnosis not present

## 2013-05-10 DIAGNOSIS — Z125 Encounter for screening for malignant neoplasm of prostate: Secondary | ICD-10-CM | POA: Diagnosis not present

## 2013-05-17 DIAGNOSIS — Z0000 Encounter for general adult medical examination without abnormal findings: Secondary | ICD-10-CM | POA: Diagnosis not present

## 2013-05-17 DIAGNOSIS — Z23 Encounter for immunization: Secondary | ICD-10-CM | POA: Diagnosis not present

## 2013-05-17 DIAGNOSIS — M542 Cervicalgia: Secondary | ICD-10-CM | POA: Diagnosis not present

## 2013-05-17 DIAGNOSIS — I10 Essential (primary) hypertension: Secondary | ICD-10-CM | POA: Diagnosis not present

## 2013-05-17 DIAGNOSIS — Z1212 Encounter for screening for malignant neoplasm of rectum: Secondary | ICD-10-CM | POA: Diagnosis not present

## 2013-05-17 DIAGNOSIS — E785 Hyperlipidemia, unspecified: Secondary | ICD-10-CM | POA: Diagnosis not present

## 2013-05-17 DIAGNOSIS — E739 Lactose intolerance, unspecified: Secondary | ICD-10-CM | POA: Diagnosis not present

## 2013-05-17 DIAGNOSIS — N401 Enlarged prostate with lower urinary tract symptoms: Secondary | ICD-10-CM | POA: Diagnosis not present

## 2013-05-17 DIAGNOSIS — K2270 Barrett's esophagus without dysplasia: Secondary | ICD-10-CM | POA: Diagnosis not present

## 2013-05-17 DIAGNOSIS — Z1331 Encounter for screening for depression: Secondary | ICD-10-CM | POA: Diagnosis not present

## 2013-05-17 DIAGNOSIS — R259 Unspecified abnormal involuntary movements: Secondary | ICD-10-CM | POA: Diagnosis not present

## 2013-05-17 DIAGNOSIS — N138 Other obstructive and reflux uropathy: Secondary | ICD-10-CM | POA: Diagnosis not present

## 2013-05-23 DIAGNOSIS — M4712 Other spondylosis with myelopathy, cervical region: Secondary | ICD-10-CM | POA: Diagnosis not present

## 2013-05-23 DIAGNOSIS — G039 Meningitis, unspecified: Secondary | ICD-10-CM | POA: Diagnosis not present

## 2013-05-23 DIAGNOSIS — M4716 Other spondylosis with myelopathy, lumbar region: Secondary | ICD-10-CM | POA: Diagnosis not present

## 2013-05-31 ENCOUNTER — Other Ambulatory Visit: Payer: Self-pay | Admitting: Neurosurgery

## 2013-05-31 DIAGNOSIS — M4712 Other spondylosis with myelopathy, cervical region: Secondary | ICD-10-CM

## 2013-06-03 ENCOUNTER — Ambulatory Visit
Admission: RE | Admit: 2013-06-03 | Discharge: 2013-06-03 | Disposition: A | Discharge status: Home / Self Care | Payer: Medicare Other | Source: Ambulatory Visit | Attending: Neurosurgery | Admitting: Neurosurgery

## 2013-06-03 DIAGNOSIS — M4712 Other spondylosis with myelopathy, cervical region: Secondary | ICD-10-CM

## 2013-06-03 DIAGNOSIS — M47812 Spondylosis without myelopathy or radiculopathy, cervical region: Secondary | ICD-10-CM | POA: Diagnosis not present

## 2013-06-03 MED ORDER — GADOBENATE DIMEGLUMINE 529 MG/ML IV SOLN
19.0000 mL | Freq: Once | INTRAVENOUS | Status: AC | PRN
  Administered 2013-06-03: 19 mL via INTRAVENOUS

## 2013-06-14 ENCOUNTER — Ambulatory Visit (INDEPENDENT_AMBULATORY_CARE_PROVIDER_SITE_OTHER): Payer: Medicare Other | Admitting: Cardiology

## 2013-06-14 ENCOUNTER — Encounter: Payer: Self-pay | Admitting: Cardiology

## 2013-06-14 VITALS — BP 112/78 | HR 61 | Ht 73.0 in | Wt 205.0 lb

## 2013-06-14 DIAGNOSIS — R002 Palpitations: Secondary | ICD-10-CM | POA: Diagnosis not present

## 2013-06-14 DIAGNOSIS — I4510 Unspecified right bundle-branch block: Secondary | ICD-10-CM | POA: Diagnosis not present

## 2013-06-14 NOTE — Assessment & Plan Note (Signed)
He is not having any significant palpitations. No further workup. 

## 2013-06-14 NOTE — Progress Notes (Signed)
Patient ID: Joshua [name], male   DOB: June 28, 1945, 68 y.o.   MRN: [id number]    HPI  Patient is seen to follow up history of some chest discomfort. There is no documented coronary disease. Last year we did do a nuclear scan. There was no significant abnormality. Wall motion is normal. Since that time he's doing well. He does have lumbar and cervical spine difficulties. These are stable at this time.  Allergies  Allergen Reactions  . Floxin [Ofloxacin]     Current Outpatient Prescriptions  Medication Sig Dispense Refill  . albuterol (PROVENTIL HFA;VENTOLIN HFA) 108 (90 BASE) MCG/ACT inhaler Inhale 2 puffs into the lungs as needed for wheezing.      [name] aspirin 81 MG tablet Take 81 mg by mouth daily.        [name] BEPREVE 1.5 % SOLN Place into both eyes as needed. 1.5% one drop in both as need two to four times a day      . DYMISTA 137-50 MCG/ACT SUSP One spray each nostril twice a day.       . finasteride (PROSCAR) 5 MG tablet Take 5 mg by mouth Daily.      . fish oil-omega-3 fatty acids 1000 MG capsule 1200 mg daily       . ibuprofen (ADVIL,MOTRIN) 800 MG tablet As needed       . lansoprazole (PREVACID) 30 MG capsule TAKE ONE CAPSULE BY MOUTH TWICE A DAY  180 capsule  1  . levocetirizine (XYZAL) 5 MG tablet Take 5 mg by mouth every evening.        . loteprednol (ALREX) 0.2 % SUSP 1 drop 2 (two) times daily.      . metoprolol (TOPROL-XL) 50 MG 24 hr tablet Take 100 mg by mouth daily.       . montelukast (SINGULAIR) 10 MG tablet Take 10 mg by mouth at bedtime.        . rosuvastatin (CRESTOR) 10 MG tablet Take 1/2 tablet daily      . tamsulosin (FLOMAX) 0.4 MG CAPS capsule 0.8 mg daily after supper.       . traMADol-acetaminophen (ULTRACET) 37.5-325 MG per tablet Take 1 tablet by mouth every 6 (six) hours as needed.      . zolpidem (AMBIEN) 10 MG tablet Take 10 mg by mouth at bedtime.       No current facility-administered medications for this visit.    History   Social History  . Marital  Status: Married    Spouse Name: N/A    Number of Children: 2  . Years of Education: N/A   Occupational History  . retired    Social History Main Topics  . Smoking status: Never Smoker   . Smokeless tobacco: Never Used  . Alcohol Use: No  . Drug Use: No  . Sexual Activity: Not on file   Other Topics Concern  . Not on file   Social History Narrative  . No narrative on file    Family History  Problem Relation Age of Onset  . Heart attack Father   . Heart disease Father   . Clotting disorder Father   . Heart disease Brother   . Hypertension Mother   . Breast cancer Paternal Grandmother   . Cancer Paternal Grandmother     breast  . Heart attack Paternal Grandfather   . Colon cancer Neg Hx   . Esophageal cancer Neg Hx   . Stomach cancer Neg Hx   . Rectal cancer  Neg Hx     Past Medical History  Diagnosis Date  . Vertigo   . Sleep apnea     pt denies  . Palpitations     Benign PVCs  . Dyslipidemia   . GERD (gastroesophageal reflux disease)   . HTN (hypertension)   . Chest pain     Coronaries normal 1996 /  nuclear..06/2002..normal...EF  56% /  stress echo.. May, 2011.... no  scar or ischemia... rate related RBBB  . RBBB (right bundle branch block)     rate related  . Hx of colonoscopy   . Ejection fraction     EF 60%, stress echo, May, 2011  . Carotid artery disease     Carotid Doppler normal August, 2007  . Tremor     Hand tremor  . Diverticulosis   . Shingles   . IFG (impaired fasting glucose)   . Allergic rhinitis   . Hx of colonic polyps     adenomatous  . Asthma   . Anal fissure   . Arthritis     Past Surgical History  Procedure Laterality Date  . Back surgery  2002,2009    x 6  . Neck surgery  2002  . Hernia repair      laprascopic  . Rotator cuff repair Left   . Cholecystectomy  11/30/2012    with IOC    Patient Active Problem List   Diagnosis Date Noted  . Tremor   . Ejection fraction   . Vertigo   . Sleep apnea   . Palpitations    . Dyslipidemia   . GERD (gastroesophageal reflux disease)   . HTN (hypertension)   . Chest pain   . RBBB (right bundle branch block)   . Ejection fraction   . Carotid artery disease     ROS   Patient denies fever, chills, headache, sweats, rash, change in vision, change in hearing, chest pain, cough, nausea vomiting, urinary symptoms. All other systems are reviewed and are negative.  PHYSICAL EXAM  Patient is oriented to person time and place. Affect is normal. Head is atraumatic. Sclera and conjunctiva are normal. There is no jugulovenous distention. Lungs are clear. Respiratory effort is nonlabored. Cardiac exam reveals S1 and S2. There no clicks or significant murmurs. The abdomen is soft. There is no peripheral edema.  Filed Vitals:   06/14/13 1112  BP: 112/78  Pulse: 61  Height: 6\' 1"  (1.854 m)  Weight: 205 lb (92.987 kg)   EKG is done today and reviewed by me. QRS is narrow today. There is normal sinus rhythm.  ASSESSMENT & PLAN

## 2013-06-14 NOTE — Assessment & Plan Note (Signed)
He's not having any significant chest pain. No further workup.

## 2013-06-14 NOTE — Patient Instructions (Signed)
**Note De-identified Joshua [name] Obfuscation** Your physician recommends that you continue on your current medications as directed. Please refer to the Current Medication list given to you today.  Your physician wants you to follow-up in: 1 year. You will receive a reminder letter in the mail two months in advance. If you don't receive a letter, please call our office to schedule the follow-up appointment.  

## 2013-06-14 NOTE — Assessment & Plan Note (Signed)
There is history of rate related right bundle branch block. QRS is narrow today with a rate of 61. No further workup.

## 2013-07-04 DIAGNOSIS — M4712 Other spondylosis with myelopathy, cervical region: Secondary | ICD-10-CM | POA: Diagnosis not present

## 2013-07-04 DIAGNOSIS — M4716 Other spondylosis with myelopathy, lumbar region: Secondary | ICD-10-CM | POA: Diagnosis not present

## 2013-07-20 DIAGNOSIS — I10 Essential (primary) hypertension: Secondary | ICD-10-CM | POA: Diagnosis not present

## 2013-07-20 DIAGNOSIS — K219 Gastro-esophageal reflux disease without esophagitis: Secondary | ICD-10-CM | POA: Diagnosis not present

## 2013-07-20 DIAGNOSIS — J069 Acute upper respiratory infection, unspecified: Secondary | ICD-10-CM | POA: Diagnosis not present

## 2013-07-20 DIAGNOSIS — J45909 Unspecified asthma, uncomplicated: Secondary | ICD-10-CM | POA: Diagnosis not present

## 2013-07-20 DIAGNOSIS — J309 Allergic rhinitis, unspecified: Secondary | ICD-10-CM | POA: Diagnosis not present

## 2013-07-20 DIAGNOSIS — Z6825 Body mass index (BMI) 25.0-25.9, adult: Secondary | ICD-10-CM | POA: Diagnosis not present

## 2013-07-26 ENCOUNTER — Encounter: Payer: Self-pay | Admitting: Internal Medicine

## 2013-08-07 ENCOUNTER — Encounter: Payer: Self-pay | Admitting: Internal Medicine

## 2013-08-11 ENCOUNTER — Ambulatory Visit (AMBULATORY_SURGERY_CENTER): Payer: Self-pay | Admitting: *Deleted

## 2013-08-11 VITALS — Ht 73.0 in | Wt 206.8 lb

## 2013-08-11 DIAGNOSIS — K2270 Barrett's esophagus without dysplasia: Secondary | ICD-10-CM

## 2013-08-11 NOTE — Progress Notes (Signed)
No allergies to eggs or soy. No problems with anesthesia.  No oxygen use  No diet drug use  

## 2013-08-16 ENCOUNTER — Ambulatory Visit (AMBULATORY_SURGERY_CENTER): Payer: Medicare Other | Admitting: Internal Medicine

## 2013-08-16 ENCOUNTER — Encounter: Payer: Self-pay | Admitting: Internal Medicine

## 2013-08-16 VITALS — BP 133/81 | HR 52 | Temp 96.7°F | Resp 19 | Ht 73.0 in | Wt 206.0 lb

## 2013-08-16 DIAGNOSIS — R42 Dizziness and giddiness: Secondary | ICD-10-CM | POA: Diagnosis not present

## 2013-08-16 DIAGNOSIS — K2270 Barrett's esophagus without dysplasia: Secondary | ICD-10-CM | POA: Diagnosis not present

## 2013-08-16 DIAGNOSIS — R002 Palpitations: Secondary | ICD-10-CM | POA: Diagnosis not present

## 2013-08-16 DIAGNOSIS — D131 Benign neoplasm of stomach: Secondary | ICD-10-CM | POA: Diagnosis not present

## 2013-08-16 DIAGNOSIS — J45909 Unspecified asthma, uncomplicated: Secondary | ICD-10-CM | POA: Diagnosis not present

## 2013-08-16 DIAGNOSIS — I10 Essential (primary) hypertension: Secondary | ICD-10-CM | POA: Diagnosis not present

## 2013-08-16 DIAGNOSIS — K219 Gastro-esophageal reflux disease without esophagitis: Secondary | ICD-10-CM | POA: Diagnosis not present

## 2013-08-16 DIAGNOSIS — E669 Obesity, unspecified: Secondary | ICD-10-CM | POA: Diagnosis not present

## 2013-08-16 DIAGNOSIS — I4510 Unspecified right bundle-branch block: Secondary | ICD-10-CM | POA: Diagnosis not present

## 2013-08-16 MED ORDER — SODIUM CHLORIDE 0.9 % IV SOLN: 500.0000 mL | INTRAVENOUS | Status: DC

## 2013-08-16 NOTE — Op Note (Signed)
[place]  [employer] [address]. [place], [address]   ENDOSCOPY PROCEDURE REPORT  PATIENT: Joshua [name], Joshua [name]  MR#: [id number] BIRTHDATE: [birth year] , 15  yrs. old GENDER: Male ENDOSCOPIST: Eustace Quail, MD REFERRED BY:  Surveillance Program Recall PROCEDURE DATE:  08/16/2013 PROCEDURE:  EGD w/ biopsies ASA CLASS:     Class II INDICATIONS:  Surveillance for Barrett's esophagus. Index exam August 2014 revealed short segment Barrett's with low-grade dysplasia. PPI was increased from lansoprazole 30 mg daily to lansoprazole 30 mg twice a day. Patient reports some breakthrough reflux. No dysphagia. MEDICATIONS: MAC sedation, administered by CRNA and propofol (Diprivan) 290mg  IV TOPICAL ANESTHETIC: none DESCRIPTION OF PROCEDURE: After the risks benefits and alternatives of the procedure were thoroughly explained, informed consent was obtained.  The LB [id number] endoscope was introduced through the mouth and advanced to the second portion of the duodenum. Without limitations.  The instrument was slowly withdrawn as the mucosa was fully examined.    EXAM:The esophagus revealed a 2.5 cm tongue of Barrett's mucosa extending from the gastric folds eccentrically.  This was examined under white light as well as NBI and high power magnification.  No obvious nodule, inflammation, or villous irregularity.  Multiple biopsies taken.  The stomach revealed multiple fundic gland type polyps between 5 and 20 mm.  Multiple biopsies of the largest polyps taken.  The stomach was otherwise normal.  The duodenum was normal.  Retroflexed views revealed a hiatal hernia.     The scope was then withdrawn from the patient and the procedure completed. COMPLICATIONS: There were no complications. ENDOSCOPIC IMPRESSION: 1. Barrett's esophagus; status post biopsies 2. Gastric polyps (benign fundic gland-type in appearance). Status post biopsies. RECOMMENDATIONS: 1.  Await pathology  results 2.  Anti-reflux regimen to be followed 3.  Continue PPI twice daily 4.  FOLLOW UP EGD FOR REPEAT BARRETT'S SURVEILLANCE IN ONE YEAR if no dysplasia. If low-grade dysplasia persists, consider referral for ablation therapy  REPEAT EXAM:  eSigned:  Eustace Quail, MD 08/16/2013 9:47 AM   CC:W.  Lutricia Feil, MD and The Patient

## 2013-08-16 NOTE — Progress Notes (Signed)
Report to PACU, RN, vss, BBS= Clear.  

## 2013-08-16 NOTE — Progress Notes (Signed)
Called to room to assist during endoscopic procedure.  Patient ID and intended procedure confirmed with present staff. Received instructions for my participation in the procedure from the performing physician.  

## 2013-08-16 NOTE — Patient Instructions (Signed)
YOU HAD AN ENDOSCOPIC PROCEDURE TODAY AT THE [facility]: Refer to the procedure report that was given to you for any specific questions about what was found during the examination.  If the procedure report does not answer your questions, please call your gastroenterologist to clarify.  If you requested that your care partner not be given the details of your procedure findings, then the procedure report has been included in a sealed envelope for you to review at your convenience later.  YOU SHOULD EXPECT: Some feelings of bloating in the abdomen. Passage of more gas than usual.  Walking can help get rid of the air that was put into your GI tract during the procedure and reduce the bloating. If you had a lower endoscopy (such as a colonoscopy or flexible sigmoidoscopy) you may notice spotting of blood in your stool or on the toilet paper. If you underwent a bowel prep for your procedure, then you may not have a normal bowel movement for a few days.  DIET: Your first meal following the procedure should be a light meal and then it is ok to progress to your normal diet.  A half-sandwich or bowl of soup is an example of a good first meal.  Heavy or fried foods are harder to digest and may make you feel nauseous or bloated.  Likewise meals heavy in dairy and vegetables can cause extra gas to form and this can also increase the bloating.  Drink plenty of fluids but you should avoid alcoholic beverages for 24 hours.  ACTIVITY: Your care partner should take you home directly after the procedure.  You should plan to take it easy, moving slowly for the rest of the day.  You can resume normal activity the day after the procedure however you should NOT DRIVE or use heavy machinery for 24 hours (because of the sedation medicines used during the test).    SYMPTOMS TO REPORT IMMEDIATELY: A gastroenterologist can be reached at any hour.  During normal business hours, 8:30 AM to 5:00 PM Monday through Friday,  call [phone].  After hours and on weekends, please call the GI answering service at [phone] who will take a message and have the physician on call contact you.    Following upper endoscopy (EGD)  Vomiting of blood or coffee ground material  New chest pain or pain under the shoulder blades  Painful or persistently difficult swallowing  New shortness of breath  Fever of 100F or higher  Black, tarry-looking stools  FOLLOW UP: If any biopsies were taken you will be contacted by phone or by letter within the next 1-3 weeks.  Call your gastroenterologist if you have not heard about the biopsies in 3 weeks.  Our staff will call the home number listed on your records the next business day following your procedure to check on you and address any questions or concerns that you may have at that time regarding the information given to you following your procedure. This is a courtesy call and so if there is no answer at the home number and we have not heard from you through the emergency physician on call, we will assume that you have returned to your regular daily activities without incident.  SIGNATURES/CONFIDENTIALITY: You and/or your care partner have signed paperwork which will be entered into your electronic medical record.  These signatures attest to the fact that that the information above on your After Visit Summary has been reviewed and is understood.  Full   responsibility of the confidentiality of this discharge information lies with you and/or your care-partner.  Resume medications. Information given on Barrett's with discharge instructions.

## 2013-08-17 ENCOUNTER — Telehealth: Payer: Self-pay | Admitting: *Deleted

## 2013-08-17 NOTE — Telephone Encounter (Signed)
  Follow up Call-  Call back number 08/16/2013 09/06/2012  Post procedure Call Back phone  # [phone]  Permission to leave phone message Yes Yes     Patient questions:  Do you have a fever, pain , or abdominal swelling? No. Pain Score  0 *  Have you tolerated food without any problems? Yes.    Have you been able to return to your normal activities? Yes.    Do you have any questions about your discharge instructions: Diet   No. Medications  No. Follow up visit  No.  Do you have questions or concerns about your Care? No.  Actions: * If pain score is 4 or above: No action needed, pain <4.

## 2013-08-25 DIAGNOSIS — Z8739 Personal history of other diseases of the musculoskeletal system and connective tissue: Secondary | ICD-10-CM | POA: Diagnosis not present

## 2013-09-04 ENCOUNTER — Encounter: Payer: Self-pay | Admitting: Internal Medicine

## 2013-09-15 ENCOUNTER — Encounter (HOSPITAL_COMMUNITY): Payer: Self-pay

## 2013-09-22 DIAGNOSIS — F341 Dysthymic disorder: Secondary | ICD-10-CM | POA: Diagnosis not present

## 2013-09-22 DIAGNOSIS — R197 Diarrhea, unspecified: Secondary | ICD-10-CM | POA: Diagnosis not present

## 2013-09-22 DIAGNOSIS — R1084 Generalized abdominal pain: Secondary | ICD-10-CM | POA: Diagnosis not present

## 2013-09-22 DIAGNOSIS — K589 Irritable bowel syndrome without diarrhea: Secondary | ICD-10-CM | POA: Diagnosis not present

## 2013-09-25 DIAGNOSIS — R197 Diarrhea, unspecified: Secondary | ICD-10-CM | POA: Diagnosis not present

## 2013-10-02 DIAGNOSIS — J45909 Unspecified asthma, uncomplicated: Secondary | ICD-10-CM | POA: Diagnosis not present

## 2013-10-02 DIAGNOSIS — J3089 Other allergic rhinitis: Secondary | ICD-10-CM | POA: Diagnosis not present

## 2013-10-02 DIAGNOSIS — H1045 Other chronic allergic conjunctivitis: Secondary | ICD-10-CM | POA: Diagnosis not present

## 2013-10-21 ENCOUNTER — Other Ambulatory Visit: Payer: Self-pay | Admitting: Internal Medicine

## 2013-11-09 DIAGNOSIS — Z23 Encounter for immunization: Secondary | ICD-10-CM | POA: Diagnosis not present

## 2013-11-24 DIAGNOSIS — M1811 Unilateral primary osteoarthritis of first carpometacarpal joint, right hand: Secondary | ICD-10-CM | POA: Diagnosis not present

## 2013-11-24 DIAGNOSIS — M1812 Unilateral primary osteoarthritis of first carpometacarpal joint, left hand: Secondary | ICD-10-CM | POA: Diagnosis not present

## 2013-12-22 DIAGNOSIS — M1812 Unilateral primary osteoarthritis of first carpometacarpal joint, left hand: Secondary | ICD-10-CM | POA: Diagnosis not present

## 2013-12-22 DIAGNOSIS — M1811 Unilateral primary osteoarthritis of first carpometacarpal joint, right hand: Secondary | ICD-10-CM | POA: Diagnosis not present

## 2014-01-08 DIAGNOSIS — R05 Cough: Secondary | ICD-10-CM | POA: Diagnosis not present

## 2014-01-08 DIAGNOSIS — J45909 Unspecified asthma, uncomplicated: Secondary | ICD-10-CM | POA: Diagnosis not present

## 2014-01-08 DIAGNOSIS — Z6827 Body mass index (BMI) 27.0-27.9, adult: Secondary | ICD-10-CM | POA: Diagnosis not present

## 2014-01-08 DIAGNOSIS — J209 Acute bronchitis, unspecified: Secondary | ICD-10-CM | POA: Diagnosis not present

## 2014-01-08 DIAGNOSIS — J029 Acute pharyngitis, unspecified: Secondary | ICD-10-CM | POA: Diagnosis not present

## 2014-01-26 ENCOUNTER — Encounter: Payer: Self-pay | Admitting: Internal Medicine

## 2014-01-29 ENCOUNTER — Encounter: Payer: Self-pay | Admitting: Internal Medicine

## 2014-03-15 ENCOUNTER — Ambulatory Visit (AMBULATORY_SURGERY_CENTER): Payer: Self-pay | Admitting: *Deleted

## 2014-03-15 VITALS — Ht 73.0 in | Wt 214.4 lb

## 2014-03-15 DIAGNOSIS — K2270 Barrett's esophagus without dysplasia: Secondary | ICD-10-CM

## 2014-03-15 NOTE — Progress Notes (Signed)
No egg or soy allergy  No anesthesia or intubation problems per pt  No diet medications taken  Registered in EMMI   

## 2014-03-29 ENCOUNTER — Ambulatory Visit (AMBULATORY_SURGERY_CENTER): Payer: Medicare HMO | Admitting: Internal Medicine

## 2014-03-29 ENCOUNTER — Encounter: Payer: Self-pay | Admitting: Internal Medicine

## 2014-03-29 VITALS — BP 114/75 | HR 58 | Temp 95.9°F | Resp 16 | Ht 73.0 in | Wt 214.0 lb

## 2014-03-29 DIAGNOSIS — K219 Gastro-esophageal reflux disease without esophagitis: Secondary | ICD-10-CM

## 2014-03-29 DIAGNOSIS — K2270 Barrett's esophagus without dysplasia: Secondary | ICD-10-CM

## 2014-03-29 HISTORY — DX: Barrett's esophagus without dysplasia: K22.70

## 2014-03-29 MED ORDER — SODIUM CHLORIDE 0.9 % IV SOLN: 500.0000 mL | INTRAVENOUS | Status: DC

## 2014-03-29 NOTE — Patient Instructions (Signed)
YOU HAD AN ENDOSCOPIC PROCEDURE TODAY AT [facility]:   Refer to the procedure report that was given to you for any specific questions about what was found during the examination.  If the procedure report does not answer your questions, please call your gastroenterologist to clarify.  If you requested that your care partner not be given the details of your procedure findings, then the procedure report has been included in a sealed envelope for you to review at your convenience later.  YOU SHOULD EXPECT: Some feelings of bloating in the abdomen. Passage of more gas than usual.  Walking can help get rid of the air that was put into your GI tract during the procedure and reduce the bloating. If you had a lower endoscopy (such as a colonoscopy or flexible sigmoidoscopy) you may notice spotting of blood in your stool or on the toilet paper. If you underwent a bowel prep for your procedure, you may not have a normal bowel movement for a few days.  Please Note:  You might notice some irritation and congestion in your nose or some drainage.  This is from the oxygen used during your procedure.  There is no need for concern and it should clear up in a day or so.  SYMPTOMS TO REPORT IMMEDIATELY:    Following upper endoscopy (EGD)  Vomiting of blood or coffee ground material  New chest pain or pain under the shoulder blades  Painful or persistently difficult swallowing  New shortness of breath  Fever of 100F or higher  Black, tarry-looking stools  For urgent or emergent issues, a gastroenterologist can be reached at any hour by calling [phone].   DIET: Your first meal following the procedure should be a small meal and then it is ok to progress to your normal diet. Heavy or fried foods are harder to digest and may make you feel nauseous or bloated.  Likewise, meals heavy in dairy and vegetables can increase bloating.  Drink plenty of fluids but you should avoid alcoholic beverages  for 24 hours.  ACTIVITY:  You should plan to take it easy for the rest of today and you should NOT DRIVE or use heavy machinery until tomorrow (because of the sedation medicines used during the test).    FOLLOW UP: Our staff will call the number listed on your records the next business day following your procedure to check on you and address any questions or concerns that you may have regarding the information given to you following your procedure. If we do not reach you, we will leave a message.  However, if you are feeling well and you are not experiencing any problems, there is no need to return our call.  We will assume that you have returned to your regular daily activities without incident.  If any biopsies were taken you will be contacted by phone or by letter within the next 1-3 weeks.  Please call us at [phone] if you have not heard about the biopsies in 3 weeks.    SIGNATURES/CONFIDENTIALITY: You and/or your care partner have signed paperwork which will be entered into your electronic medical record.  These signatures attest to the fact that that the information above on your After Visit Summary has been reviewed and is understood.  Full responsibility of the confidentiality of this discharge information lies with you and/or your care-partner.  Recommendations Discharge instructions given to patient and/or care partner. Barrett's handout provided.

## 2014-03-29 NOTE — Progress Notes (Signed)
A/ox3 pleased with MAC, report to Wendy RN 

## 2014-03-29 NOTE — Progress Notes (Signed)
Called to room to assist during endoscopic procedure.  Patient ID and intended procedure confirmed with present staff. Received instructions for my participation in the procedure from the performing physician.  

## 2014-03-29 NOTE — Op Note (Signed)
[place]  [address]. [place], [address]   ENDOSCOPY PROCEDURE REPORT  PATIENT: Joshua [name], Joshua [name]  MR#: [id number] BIRTHDATE: Mar 28, 1945 , 59  yrs. old GENDER: male ENDOSCOPIST: Eustace Quail, MD REFERRED BY:  Surveillance Program Recall PROCEDURE DATE:  03/29/2014 PROCEDURE:  EGD w/ biopsy ASA CLASS:     Class II INDICATIONS:  Barrett's low grade dysplasia, on previous exams August 2014 and August 2015. MEDICATIONS: Monitored anesthesia care and Propofol 300 mg IV TOPICAL ANESTHETIC: none  DESCRIPTION OF PROCEDURE: After the risks benefits and alternatives of the procedure were thoroughly explained, informed consent was obtained.  The LB [id number] endoscope was introduced through the mouth and advanced to the second portion of the duodenum , Without limitations.  The instrument was slowly withdrawn as the mucosa was fully examined.    EXAM: The esophagus revealed a 3 cm tongue of Barrett's mucosa in the distalmost portion (C0M3).  This was examined under white light and NBI at regular and high power.  Multiple biopsies taken.  The stomach revealed multiple fundic gland polyps but was otherwise normal.  The duodenum was normal.  Retroflexed views revealed a hiatal hernia.     The scope was then withdrawn from the patient and the procedure completed.  COMPLICATIONS: There were no immediate complications.  ENDOSCOPIC IMPRESSION: 1. Barrett's esophagus 2. Fundic gland polyps 3. Otherwise normal exam  RECOMMENDATIONS: 1.  Await biopsy results for further recommendations 2.  Continue PPI  (currently taking [place] op resolve 30 mg twice daily)  REPEAT EXAM:  eSigned:  Eustace Quail, MD 03/29/2014 10:00 AM    CC:W.  Lutricia Feil, MD and The Patient

## 2014-03-30 ENCOUNTER — Telehealth: Payer: Self-pay | Admitting: *Deleted

## 2014-03-30 NOTE — Telephone Encounter (Signed)
  Follow up Call-  Call back number 03/29/2014 08/16/2013 09/06/2012  Post procedure Call Back phone  # [phone]  Permission to leave phone message Yes Yes Yes     Patient questions:  Do you have a fever, pain , or abdominal swelling? No. Pain Score  0 *  Have you tolerated food without any problems? Yes.    Have you been able to return to your normal activities? Yes.    Do you have any questions about your discharge instructions: Diet   No. Medications  No. Follow up visit  No.  Do you have questions or concerns about your Care? No.  Actions: * If pain score is 4 or above: No action needed, pain <4.

## 2014-04-12 ENCOUNTER — Encounter (HOSPITAL_COMMUNITY): Payer: Self-pay

## 2014-04-17 ENCOUNTER — Encounter: Payer: Self-pay | Admitting: Internal Medicine

## 2014-04-17 ENCOUNTER — Telehealth: Payer: Self-pay | Admitting: Internal Medicine

## 2014-04-17 NOTE — Telephone Encounter (Signed)
I contacted Joshua [name] to review his recent esophageal biopsies which show Barrett's with at least high-grade dysplasia. I will refer him to Dr. Renford Dills at [facility] for endoscopic ablation therapy. He will continue on lansoprazole 30 mg twice daily.

## 2014-04-18 NOTE — Telephone Encounter (Signed)
Referral information faxed to Dr. Samuel Jester office for asap appt.

## 2014-04-19 ENCOUNTER — Other Ambulatory Visit: Payer: Self-pay | Admitting: Internal Medicine

## 2014-05-09 NOTE — Telephone Encounter (Signed)
Pt is scheduled with Dr. Samuel Jester NP 05/29/14@10am .

## 2014-05-25 ENCOUNTER — Encounter: Payer: Self-pay | Admitting: Internal Medicine

## 2014-05-25 NOTE — Telephone Encounter (Signed)
Joshua [name], I drafted a letter to Dr. Adria Devon regarding [name]. Please send that letter along with his 3 upper endoscopy reports (August 2014, August 2015, March 2016) and accompanying pathology from each of those exams. Also, the outside pathology report from [facility]. Fax all of these to Dr. Samuel Jester office. The patient is being seen on Tuesday. Please confirm with them that they have received this information. Thank you Dr. Henrene Pastor

## 2014-05-25 NOTE — Telephone Encounter (Signed)
Records faxed to Dr. Samuel Jester office.

## 2014-05-28 NOTE — Telephone Encounter (Signed)
Called office and confirmed that records received.

## 2014-05-29 ENCOUNTER — Encounter: Payer: Self-pay | Admitting: Internal Medicine

## 2014-10-16 ENCOUNTER — Other Ambulatory Visit: Payer: Self-pay | Admitting: Neurosurgery

## 2014-10-16 DIAGNOSIS — M4716 Other spondylosis with myelopathy, lumbar region: Secondary | ICD-10-CM

## 2014-10-17 ENCOUNTER — Other Ambulatory Visit: Payer: Self-pay | Admitting: Internal Medicine

## 2014-11-01 ENCOUNTER — Ambulatory Visit
Admission: RE | Admit: 2014-11-01 | Discharge: 2014-11-01 | Disposition: A | Discharge status: Home / Self Care | Payer: Medicare HMO | Source: Ambulatory Visit | Attending: Neurosurgery | Admitting: Neurosurgery

## 2014-11-01 DIAGNOSIS — M4716 Other spondylosis with myelopathy, lumbar region: Secondary | ICD-10-CM

## 2014-11-01 MED ORDER — GADOBENATE DIMEGLUMINE 529 MG/ML IV SOLN
20.0000 mL | Freq: Once | INTRAVENOUS | Status: AC | PRN
  Administered 2014-11-01: 20 mL via INTRAVENOUS

## 2015-01-13 DIAGNOSIS — I639 Cerebral infarction, unspecified: Secondary | ICD-10-CM

## 2015-01-13 HISTORY — DX: Cerebral infarction, unspecified: I63.9

## 2015-01-22 DIAGNOSIS — M4716 Other spondylosis with myelopathy, lumbar region: Secondary | ICD-10-CM | POA: Diagnosis not present

## 2015-01-22 DIAGNOSIS — M4712 Other spondylosis with myelopathy, cervical region: Secondary | ICD-10-CM | POA: Diagnosis not present

## 2015-01-22 DIAGNOSIS — G039 Meningitis, unspecified: Secondary | ICD-10-CM | POA: Diagnosis not present

## 2015-01-22 DIAGNOSIS — G5632 Lesion of radial nerve, left upper limb: Secondary | ICD-10-CM | POA: Diagnosis not present

## 2015-01-31 DIAGNOSIS — M170 Bilateral primary osteoarthritis of knee: Secondary | ICD-10-CM | POA: Diagnosis not present

## 2015-02-12 DIAGNOSIS — L720 Epidermal cyst: Secondary | ICD-10-CM | POA: Diagnosis not present

## 2015-02-13 DIAGNOSIS — G459 Transient cerebral ischemic attack, unspecified: Secondary | ICD-10-CM

## 2015-02-13 HISTORY — DX: Transient cerebral ischemic attack, unspecified: G45.9

## 2015-02-19 DIAGNOSIS — K219 Gastro-esophageal reflux disease without esophagitis: Secondary | ICD-10-CM | POA: Diagnosis not present

## 2015-02-19 DIAGNOSIS — R7301 Impaired fasting glucose: Secondary | ICD-10-CM | POA: Diagnosis not present

## 2015-02-19 DIAGNOSIS — K22711 Barrett's esophagus with high grade dysplasia: Secondary | ICD-10-CM | POA: Diagnosis not present

## 2015-02-19 DIAGNOSIS — K449 Diaphragmatic hernia without obstruction or gangrene: Secondary | ICD-10-CM | POA: Diagnosis not present

## 2015-02-19 DIAGNOSIS — R251 Tremor, unspecified: Secondary | ICD-10-CM | POA: Diagnosis not present

## 2015-02-19 DIAGNOSIS — J45909 Unspecified asthma, uncomplicated: Secondary | ICD-10-CM | POA: Diagnosis not present

## 2015-02-19 DIAGNOSIS — K317 Polyp of stomach and duodenum: Secondary | ICD-10-CM | POA: Diagnosis not present

## 2015-02-19 DIAGNOSIS — I491 Atrial premature depolarization: Secondary | ICD-10-CM | POA: Diagnosis not present

## 2015-02-19 DIAGNOSIS — I10 Essential (primary) hypertension: Secondary | ICD-10-CM | POA: Diagnosis not present

## 2015-02-19 DIAGNOSIS — R42 Dizziness and giddiness: Secondary | ICD-10-CM | POA: Diagnosis not present

## 2015-02-19 DIAGNOSIS — Z8719 Personal history of other diseases of the digestive system: Secondary | ICD-10-CM | POA: Diagnosis not present

## 2015-02-19 DIAGNOSIS — K2950 Unspecified chronic gastritis without bleeding: Secondary | ICD-10-CM | POA: Diagnosis not present

## 2015-02-19 DIAGNOSIS — K228 Other specified diseases of esophagus: Secondary | ICD-10-CM | POA: Diagnosis not present

## 2015-03-05 ENCOUNTER — Inpatient Hospital Stay (HOSPITAL_COMMUNITY)
Admission: EM | Admit: 2015-03-05 | Discharge: 2015-03-07 | DRG: 066 | Disposition: A | Discharge status: Home / Self Care | Payer: Medicare HMO | Attending: Family Medicine | Admitting: Family Medicine

## 2015-03-05 ENCOUNTER — Observation Stay (HOSPITAL_COMMUNITY): Payer: Medicare HMO

## 2015-03-05 ENCOUNTER — Encounter (HOSPITAL_COMMUNITY): Payer: Self-pay

## 2015-03-05 ENCOUNTER — Emergency Department (HOSPITAL_COMMUNITY): Payer: Medicare HMO

## 2015-03-05 DIAGNOSIS — I10 Essential (primary) hypertension: Secondary | ICD-10-CM | POA: Diagnosis present

## 2015-03-05 DIAGNOSIS — G4730 Sleep apnea, unspecified: Secondary | ICD-10-CM | POA: Diagnosis present

## 2015-03-05 DIAGNOSIS — E669 Obesity, unspecified: Secondary | ICD-10-CM | POA: Diagnosis present

## 2015-03-05 DIAGNOSIS — I639 Cerebral infarction, unspecified: Principal | ICD-10-CM | POA: Diagnosis present

## 2015-03-05 DIAGNOSIS — E785 Hyperlipidemia, unspecified: Secondary | ICD-10-CM | POA: Diagnosis not present

## 2015-03-05 DIAGNOSIS — Z7982 Long term (current) use of aspirin: Secondary | ICD-10-CM

## 2015-03-05 DIAGNOSIS — Z888 Allergy status to other drugs, medicaments and biological substances status: Secondary | ICD-10-CM

## 2015-03-05 DIAGNOSIS — F419 Anxiety disorder, unspecified: Secondary | ICD-10-CM | POA: Diagnosis present

## 2015-03-05 DIAGNOSIS — I6330 Cerebral infarction due to thrombosis of unspecified cerebral artery: Secondary | ICD-10-CM | POA: Diagnosis not present

## 2015-03-05 DIAGNOSIS — R29810 Facial weakness: Secondary | ICD-10-CM | POA: Diagnosis present

## 2015-03-05 DIAGNOSIS — R29703 NIHSS score 3: Secondary | ICD-10-CM | POA: Diagnosis present

## 2015-03-05 DIAGNOSIS — R531 Weakness: Secondary | ICD-10-CM

## 2015-03-05 DIAGNOSIS — N401 Enlarged prostate with lower urinary tract symptoms: Secondary | ICD-10-CM | POA: Diagnosis present

## 2015-03-05 DIAGNOSIS — K219 Gastro-esophageal reflux disease without esophagitis: Secondary | ICD-10-CM | POA: Diagnosis present

## 2015-03-05 DIAGNOSIS — R29898 Other symptoms and signs involving the musculoskeletal system: Secondary | ICD-10-CM | POA: Diagnosis not present

## 2015-03-05 DIAGNOSIS — Z8249 Family history of ischemic heart disease and other diseases of the circulatory system: Secondary | ICD-10-CM

## 2015-03-05 DIAGNOSIS — G8321 Monoplegia of upper limb affecting right dominant side: Secondary | ICD-10-CM | POA: Diagnosis present

## 2015-03-05 DIAGNOSIS — I63512 Cerebral infarction due to unspecified occlusion or stenosis of left middle cerebral artery: Secondary | ICD-10-CM

## 2015-03-05 DIAGNOSIS — J45909 Unspecified asthma, uncomplicated: Secondary | ICD-10-CM | POA: Diagnosis present

## 2015-03-05 DIAGNOSIS — R200 Anesthesia of skin: Secondary | ICD-10-CM | POA: Diagnosis not present

## 2015-03-05 DIAGNOSIS — I638 Other cerebral infarction: Secondary | ICD-10-CM | POA: Diagnosis not present

## 2015-03-05 DIAGNOSIS — Z6829 Body mass index (BMI) 29.0-29.9, adult: Secondary | ICD-10-CM

## 2015-03-05 DIAGNOSIS — I4510 Unspecified right bundle-branch block: Secondary | ICD-10-CM | POA: Diagnosis present

## 2015-03-05 HISTORY — DX: Transient cerebral ischemic attack, unspecified: G45.9

## 2015-03-05 HISTORY — DX: Headache, unspecified: R51.9

## 2015-03-05 HISTORY — DX: Headache: R51

## 2015-03-05 LAB — I-STAT TROPONIN, ED: Troponin i, poc: 0 ng/mL (ref 0.00–0.08)

## 2015-03-05 LAB — I-STAT CHEM 8, ED
BUN: 14 mg/dL (ref 6–20)
Calcium, Ion: 1.16 mmol/L (ref 1.13–1.30)
Chloride: 104 mmol/L (ref 101–111)
Creatinine, Ser: 0.9 mg/dL (ref 0.61–1.24)
Glucose, Bld: 121 mg/dL — ABNORMAL HIGH (ref 65–99)
HCT: 48 % (ref 39.0–52.0)
Hemoglobin: 16.3 g/dL (ref 13.0–17.0)
Potassium: 4.1 mmol/L (ref 3.5–5.1)
Sodium: 141 mmol/L (ref 135–145)
TCO2: 23 mmol/L (ref 0–100)

## 2015-03-05 LAB — COMPREHENSIVE METABOLIC PANEL
ALT: 22 U/L (ref 17–63)
AST: 22 U/L (ref 15–41)
Albumin: 4.2 g/dL (ref 3.5–5.0)
Alkaline Phosphatase: 65 U/L (ref 38–126)
Anion gap: 9 (ref 5–15)
BUN: 12 mg/dL (ref 6–20)
CO2: 23 mmol/L (ref 22–32)
Calcium: 9.7 mg/dL (ref 8.9–10.3)
Chloride: 108 mmol/L (ref 101–111)
Creatinine, Ser: 0.95 mg/dL (ref 0.61–1.24)
GFR calc Af Amer: >60 mL/min (ref >60)
GFR calc non Af Amer: >60 mL/min (ref >60)
Glucose, Bld: 124 mg/dL — ABNORMAL HIGH (ref 65–99)
Potassium: 4.2 mmol/L (ref 3.5–5.1)
Sodium: 140 mmol/L (ref 135–145)
Total Bilirubin: 1 mg/dL (ref 0.3–1.2)
Total Protein: 6.8 g/dL (ref 6.5–8.1)

## 2015-03-05 LAB — CBC
HCT: 45.6 % (ref 39.0–52.0)
Hemoglobin: 15.1 g/dL (ref 13.0–17.0)
MCH: 31.1 pg (ref 26.0–34.0)
MCHC: 33.1 g/dL (ref 30.0–36.0)
MCV: 94 fL (ref 78.0–100.0)
Platelets: 183 10*3/uL (ref 150–400)
RBC: 4.85 MIL/uL (ref 4.22–5.81)
RDW: 14 % (ref 11.5–15.5)
WBC: 7.9 10*3/uL (ref 4.0–10.5)

## 2015-03-05 LAB — DIFFERENTIAL
Basophils Absolute: 0.1 10*3/uL (ref 0.0–0.1)
Basophils Relative: 1 %
Eosinophils Absolute: 0.2 10*3/uL (ref 0.0–0.7)
Eosinophils Relative: 3 %
Lymphocytes Relative: 29 %
Lymphs Abs: 2.3 10*3/uL (ref 0.7–4.0)
Monocytes Absolute: 0.8 10*3/uL (ref 0.1–1.0)
Monocytes Relative: 10 %
Neutro Abs: 4.5 10*3/uL (ref 1.7–7.7)
Neutrophils Relative %: 57 %

## 2015-03-05 LAB — PROTIME-INR
INR: 1.02 (ref 0.00–1.49)
Prothrombin Time: 13.6 seconds (ref 11.6–15.2)

## 2015-03-05 LAB — APTT: aPTT: 33 seconds (ref 24–37)

## 2015-03-05 LAB — CBG MONITORING, ED: Glucose-Capillary: 115 mg/dL — ABNORMAL HIGH (ref 65–99)

## 2015-03-05 MED ORDER — ASPIRIN 300 MG RE SUPP: 300.0000 mg | Freq: Every day | RECTAL | Status: DC

## 2015-03-05 MED ORDER — FINASTERIDE 5 MG PO TABS
5.0000 mg | ORAL_TABLET | Freq: Every day | ORAL | Status: DC
  Administered 2015-03-05 – 2015-03-07 (×3): 5 mg via ORAL
  Filled 2015-03-05 (×3): qty 1

## 2015-03-05 MED ORDER — ZOLPIDEM TARTRATE 5 MG PO TABS
10.0000 mg | ORAL_TABLET | Freq: Every day | ORAL | Status: DC
  Administered 2015-03-05 – 2015-03-06 (×2): 10 mg via ORAL
  Filled 2015-03-05 (×3): qty 2

## 2015-03-05 MED ORDER — DULOXETINE HCL 60 MG PO CPEP
60.0000 mg | ORAL_CAPSULE | Freq: Every day | ORAL | Status: DC
  Administered 2015-03-06 – 2015-03-07 (×2): 60 mg via ORAL
  Filled 2015-03-05 (×3): qty 1

## 2015-03-05 MED ORDER — MONTELUKAST SODIUM 10 MG PO TABS
10.0000 mg | ORAL_TABLET | Freq: Every day | ORAL | Status: DC
  Administered 2015-03-05 – 2015-03-06 (×2): 10 mg via ORAL
  Filled 2015-03-05 (×2): qty 1

## 2015-03-05 MED ORDER — DIAZEPAM 5 MG PO TABS
5.0000 mg | ORAL_TABLET | Freq: Once | ORAL | Status: AC
  Administered 2015-03-05: 5 mg via ORAL
  Filled 2015-03-05: qty 1

## 2015-03-05 MED ORDER — FLUTICASONE PROPIONATE 50 MCG/ACT NA SUSP
1.0000 | Freq: Every day | NASAL | Status: DC
  Administered 2015-03-05 – 2015-03-06 (×2): 1 via NASAL
  Filled 2015-03-05: qty 16

## 2015-03-05 MED ORDER — LEVOCETIRIZINE DIHYDROCHLORIDE 5 MG PO TABS: 5.0000 mg | ORAL_TABLET | Freq: Every evening | ORAL | Status: DC

## 2015-03-05 MED ORDER — ROSUVASTATIN CALCIUM 5 MG PO TABS
5.0000 mg | ORAL_TABLET | Freq: Every day | ORAL | Status: DC
  Administered 2015-03-05 – 2015-03-07 (×3): 5 mg via ORAL
  Filled 2015-03-05 (×3): qty 1

## 2015-03-05 MED ORDER — LORATADINE 10 MG PO TABS
10.0000 mg | ORAL_TABLET | Freq: Every day | ORAL | Status: DC
  Administered 2015-03-05 – 2015-03-06 (×2): 10 mg via ORAL
  Filled 2015-03-05 (×2): qty 1

## 2015-03-05 MED ORDER — TAMSULOSIN HCL 0.4 MG PO CAPS
0.4000 mg | ORAL_CAPSULE | Freq: Every day | ORAL | Status: DC
  Administered 2015-03-05 – 2015-03-06 (×2): 0.4 mg via ORAL
  Filled 2015-03-05 (×2): qty 1

## 2015-03-05 MED ORDER — PANTOPRAZOLE SODIUM 20 MG PO TBEC
20.0000 mg | DELAYED_RELEASE_TABLET | Freq: Every day | ORAL | Status: DC
  Administered 2015-03-05 – 2015-03-07 (×3): 20 mg via ORAL
  Filled 2015-03-05 (×3): qty 1

## 2015-03-05 MED ORDER — ALBUTEROL SULFATE HFA 108 (90 BASE) MCG/ACT IN AERS: 2.0000 | INHALATION_SPRAY | RESPIRATORY_TRACT | Status: DC | PRN

## 2015-03-05 MED ORDER — ASPIRIN 325 MG PO TABS
325.0000 mg | ORAL_TABLET | Freq: Every day | ORAL | Status: DC
  Administered 2015-03-05 – 2015-03-07 (×3): 325 mg via ORAL
  Filled 2015-03-05 (×3): qty 1

## 2015-03-05 MED ORDER — OMEGA-3-ACID ETHYL ESTERS 1 G PO CAPS
1.0000 g | ORAL_CAPSULE | Freq: Every day | ORAL | Status: DC
  Administered 2015-03-06 – 2015-03-07 (×2): 1 g via ORAL
  Filled 2015-03-05 (×2): qty 1

## 2015-03-05 MED ORDER — OMEGA-3 FATTY ACIDS 1000 MG PO CAPS: 1.0000 g | ORAL_CAPSULE | Freq: Every day | ORAL | Status: DC

## 2015-03-05 MED ORDER — AZELASTINE HCL 0.1 % NA SOLN
1.0000 | Freq: Every day | NASAL | Status: DC
Start: 2015-03-05 — End: 2015-03-07
  Administered 2015-03-05 – 2015-03-06 (×2): 1 via NASAL
  Filled 2015-03-05: qty 30

## 2015-03-05 MED ORDER — AZELASTINE-FLUTICASONE 137-50 MCG/ACT NA SUSP: 1.0000 | Freq: Every day | NASAL | Status: DC

## 2015-03-05 MED ORDER — METOPROLOL SUCCINATE ER 25 MG PO TB24
50.0000 mg | ORAL_TABLET | Freq: Every day | ORAL | Status: DC
  Administered 2015-03-05 – 2015-03-07 (×3): 50 mg via ORAL
  Filled 2015-03-05 (×3): qty 2

## 2015-03-05 MED ORDER — ENOXAPARIN SODIUM 40 MG/0.4ML ~~LOC~~ SOLN
40.0000 mg | SUBCUTANEOUS | Status: DC
  Administered 2015-03-05 – 2015-03-06 (×2): 40 mg via SUBCUTANEOUS
  Filled 2015-03-05 (×2): qty 0.4

## 2015-03-05 MED ORDER — STROKE: EARLY STAGES OF RECOVERY BOOK
Freq: Once | Status: AC
  Administered 2015-03-05: 1
  Filled 2015-03-05: qty 1

## 2015-03-05 NOTE — ED Provider Notes (Signed)
CSN: [id number]     Arrival date & time 03/05/15  1019 History   First MD Initiated Contact with Patient 03/05/15 1044     Chief Complaint  Patient presents with  . Code Stroke     (Consider location/radiation/quality/duration/timing/severity/associated sxs/prior Treatment) HPI Last known normal 8-9 am.  The 70 year old man who states he woke up this morning with a headache. At that time he had no weakness or difficulty moving.  Later  in the morning, initially reported at 8:15, but wife state she thinks it was closer to 9. He went to open the window shades with his right hand and was weak and unable to do it. He has had waxing and waning weakness in his right hand since that time. His wife thinks he may have had some right facial droop. He has been walking without difficulty. He denies visual or speech or swallowing problems. He has had no similar symptoms in the past. Is right handed Past Medical History  Diagnosis Date  . Vertigo   . Sleep apnea     pt denies  . Palpitations     Benign PVCs  . Dyslipidemia   . GERD (gastroesophageal reflux disease)   . Chest pain     Coronaries normal 1996 /  nuclear..06/2002..normal...EF  56% /  stress echo.. May, 2011.... no  scar or ischemia... rate related RBBB  . RBBB (right bundle branch block)     rate related  . Hx of colonoscopy   . Ejection fraction     EF 60%, stress echo, May, 2011  . Carotid artery disease ([place])     Carotid Doppler normal August, 2007  . Tremor     Hand tremor  . Diverticulosis   . Shingles   . IFG (impaired fasting glucose)   . Allergic rhinitis   . Hx of colonic polyps     adenomatous  . Asthma   . Anal fissure   . Arthritis   . Allergy   . Anxiety     from chronic pain from surgery- on Cymbalta  . Cataract   . Hyperlipidemia   . HTN (hypertension)     takes Metoprolol for PVC control   Past Surgical History  Procedure Laterality Date  . Back surgery  2002,2009    x 6  . Neck surgery  2002  .  Hernia repair      laprascopic  . Rotator cuff repair Left   . Cholecystectomy  11/30/2012    with IOC  . Upper gastrointestinal endoscopy    . Colonoscopy     Family History  Problem Relation Age of Onset  . Heart attack Father   . Heart disease Father   . Clotting disorder Father   . Heart disease Brother   . Hypertension Mother   . Breast cancer Paternal Grandmother   . Cancer Paternal Grandmother     breast  . Heart attack Paternal Grandfather   . Colon cancer Neg Hx   . Esophageal cancer Neg Hx   . Stomach cancer Neg Hx   . Rectal cancer Neg Hx    Social History  Substance Use Topics  . Smoking status: Never Smoker   . Smokeless tobacco: Never Used  . Alcohol Use: No    Review of Systems  All other systems reviewed and are negative.     Allergies  Floxin  Home Medications   Prior to Admission medications   Medication Sig Start Date End Date Taking? Authorizing Provider  albuterol (PROVENTIL HFA;VENTOLIN HFA) 108 (90 BASE) MCG/ACT inhaler Inhale 2 puffs into the lungs as needed for wheezing.    Historical Provider, MD  aspirin 81 MG tablet Take 81 mg by mouth daily.      Historical Provider, MD  BEPREVE 1.5 % SOLN Place into both eyes as needed. 1.5% one drop in both as need two to four times a day 04/30/11   Historical Provider, MD  cholecalciferol (VITAMIN D) 1000 UNITS tablet Take 1,000 Units by mouth daily.    Historical Provider, MD  Docusate Calcium (STOOL SOFTENER PO) Take by mouth 2 (two) times daily.    Historical Provider, MD  DULoxetine (CYMBALTA) 60 MG capsule Take 60 mg by mouth daily.    Historical Provider, MD  DYMISTA 137-50 MCG/ACT SUSP One spray each nostril twice a day.  03/24/11   Historical Provider, MD  finasteride (PROSCAR) 5 MG tablet Take 5 mg by mouth Daily. 04/17/11   Historical Provider, MD  fish oil-omega-3 fatty acids 1000 MG capsule 1200 mg daily     Historical Provider, MD  ibuprofen (ADVIL,MOTRIN) 800 MG tablet As needed      Historical Provider, MD  lansoprazole (PREVACID) 30 MG capsule TAKE ONE CAPSULE BY MOUTH TWICE A DAY 10/17/14   Irene Shipper, MD  levocetirizine (XYZAL) 5 MG tablet Take 5 mg by mouth every evening.      Historical Provider, MD  metoprolol (TOPROL-XL) 50 MG 24 hr tablet Take 100 mg by mouth daily.     Historical Provider, MD  montelukast (SINGULAIR) 10 MG tablet Take 10 mg by mouth at bedtime.      Historical Provider, MD  rosuvastatin (CRESTOR) 10 MG tablet Take 1/2 tablet daily    Historical Provider, MD  tamsulosin (FLOMAX) 0.4 MG CAPS capsule 0.8 mg daily after supper.  05/25/13   Historical Provider, MD  traMADol-acetaminophen (ULTRACET) 37.5-325 MG per tablet Take 1 tablet by mouth every 6 (six) hours as needed.    Historical Provider, MD  zolpidem (AMBIEN) 10 MG tablet Take 10 mg by mouth at bedtime. 05/13/11   Historical Provider, MD   Wt 99.8 kg Physical Exam  Constitutional: He is oriented to person, place, and time. He appears well-developed and well-nourished.  HENT:  Head: Normocephalic and atraumatic.  Right Ear: Tympanic membrane and external ear normal.  Left Ear: Tympanic membrane and external ear normal.  Nose: Nose normal. Right sinus exhibits no maxillary sinus tenderness and no frontal sinus tenderness. Left sinus exhibits no maxillary sinus tenderness and no frontal sinus tenderness.  Eyes: Conjunctivae and EOM are normal. Pupils are equal, round, and reactive to light. Right eye exhibits no nystagmus. Left eye exhibits no nystagmus.  Neck: Normal range of motion. Neck supple.  Cardiovascular: Normal rate, regular rhythm, normal heart sounds and intact distal pulses.   Pulmonary/Chest: Effort normal and breath sounds normal. No respiratory distress. He exhibits no tenderness.  Abdominal: Soft. Bowel sounds are normal. He exhibits no distension and no mass. There is no tenderness.  Musculoskeletal: Normal range of motion. He exhibits no edema or tenderness.  Neurological: He  is alert and oriented to person, place, and time. He has normal strength and normal reflexes. No sensory deficit. He displays a negative Romberg sign. GCS eye subscore is 4. GCS verbal subscore is 5. GCS motor subscore is 6.  Reflex Scores:      Tricep reflexes are 2+ on the right side and 2+ on the left side.  Bicep reflexes are 2+ on the right side and 2+ on the left side.      Brachioradialis reflexes are 2+ on the right side and 2+ on the left side.      Patellar reflexes are 2+ on the right side and 2+ on the left side.      Achilles reflexes are 2+ on the right side and 2+ on the left side.  Speech is normal without dysarthria, dysphasia, or aphasia. Muscle strength is 5/5 in bilateral shoulders, elbow flexor slightly decreased on right.  intrinsic hand muscles.   Skin: Skin is warm and dry. No rash noted.  Psychiatric: He has a normal mood and affect. His behavior is normal. Judgment and thought content normal.  Nursing note and vitals reviewed.   ED Course  Procedures (including critical care time) Labs Review Labs Reviewed  COMPREHENSIVE METABOLIC PANEL - Abnormal; Notable for the following:    Glucose, Bld 124 (*)    All other components within normal limits  CBG MONITORING, ED - Abnormal; Notable for the following:    Glucose-Capillary 115 (*)    All other components within normal limits  I-STAT CHEM 8, ED - Abnormal; Notable for the following:    Glucose, Bld 121 (*)    All other components within normal limits  PROTIME-INR  APTT  CBC  DIFFERENTIAL  I-STAT TROPOININ, ED    Imaging Review Ct Head Wo Contrast  03/05/2015  CLINICAL DATA:  Right hand numbness and weakness. EXAM: CT HEAD WITHOUT CONTRAST TECHNIQUE: Contiguous axial images were obtained from the base of the skull through the vertex without intravenous contrast. COMPARISON:  None. FINDINGS: No acute intracranial abnormality. Specifically, no hemorrhage, hydrocephalus, mass lesion, acute infarction, or  significant intracranial injury. No acute calvarial abnormality. Visualized paranasal sinuses and mastoids clear. Orbital soft tissues unremarkable. IMPRESSION: No acute intracranial abnormality. Electronically Signed   By: Rolm Baptise M.D.   On: 03/05/2015 10:38   I have personally reviewed and evaluated these images and lab results as part of my medical decision-making.   EKG Interpretation None      MDM   Final diagnoses:  Weakness      Neurologist at wheelchair with me on patient arrival.  Patient directly to ct.   TPA not given per neurologist due to stroke score of 1 and rapidly improving symptoms. Plan admission for further evaluation.  Pattricia Boss, MD 03/05/15 [id number]

## 2015-03-05 NOTE — ED Notes (Signed)
Per pt and pt wife - pt hx neck and back surgeries, tremor in both arms w/ worse in right arm x 10 years. Pt reports worsening tremor in right arm x 6 months. Pt woke up this morning around 0800 with tension headache on right side of head and neck. Pt reports right arm numbness and right arm weakness, unable to open blinds and hold pen prior to wife transporting pt to ED. Pt a&o x4, ambulatory w/ steady gait. Wife and stroke team at bedside.

## 2015-03-05 NOTE — Progress Notes (Signed)
Pt arrived to [id number], Pt A&Ox 4, c/o pain 0/10.Pt VS taken. Pt without distress. Family at the bedside. Diet ordered, will monitor.

## 2015-03-05 NOTE — ED Notes (Signed)
Attempted report x1. 

## 2015-03-05 NOTE — Consult Note (Signed)
Referring Physician: Pattricia Boss, MD    Chief Complaint: Possible stroke.   HPI: Joshua [name] is an 70 y.o. male who presents with acute onset of headache on waking this AM, followed by acute onset right hand weakness at 9:00 AM. Weakness waxed and waned in right hand following initial symptoms. Per wife, he may have had some right facial drooping as well. No vision or speech changes.   LSN: 9:00 AM tPA Given: Not administered due to low NIHSS of 1 and rapid improvement. Risks of IV tPA outweigh benefits.   Past Medical History  Diagnosis Date  . Vertigo   . Sleep apnea     pt denies  . Palpitations     Benign PVCs  . Dyslipidemia   . GERD (gastroesophageal reflux disease)   . Chest pain     Coronaries normal 1996 /  nuclear..06/2002..normal...EF  56% /  stress echo.. May, 2011.... no  scar or ischemia... rate related RBBB  . RBBB (right bundle branch block)     rate related  . Hx of colonoscopy   . Ejection fraction     EF 60%, stress echo, May, 2011  . Carotid artery disease ([place])     Carotid Doppler normal August, 2007  . Tremor     Hand tremor  . Diverticulosis   . Shingles   . IFG (impaired fasting glucose)   . Allergic rhinitis   . Hx of colonic polyps     adenomatous  . Asthma   . Anal fissure   . Arthritis   . Allergy   . Anxiety     from chronic pain from surgery- on Cymbalta  . Cataract   . Hyperlipidemia   . HTN (hypertension)     takes Metoprolol for PVC control    Past Surgical History  Procedure Laterality Date  . Back surgery  2002,2009    x 6  . Neck surgery  2002  . Hernia repair      laprascopic  . Rotator cuff repair Left   . Cholecystectomy  11/30/2012    with IOC  . Upper gastrointestinal endoscopy    . Colonoscopy      Family History  Problem Relation Age of Onset  . Heart attack Father   . Heart disease Father   . Clotting disorder Father   . Heart disease Brother   . Hypertension Mother   . Breast cancer Paternal Grandmother    . Cancer Paternal Grandmother     breast  . Heart attack Paternal Grandfather   . Colon cancer Neg Hx   . Esophageal cancer Neg Hx   . Stomach cancer Neg Hx   . Rectal cancer Neg Hx    Social History:  reports that he has never smoked. He has never used smokeless tobacco. He reports that he does not drink alcohol or use illicit drugs.  Allergies:  Allergies  Allergen Reactions  . Floxin [Ofloxacin]     Lowers BP    Medications: Home medications include aspirin. Denies being on anticoagulation.   ROS: No limb pain. Positive for slight discomfort behind right ear, otherwise no headache. Denies dysphasia or dysarthria.   Physical Examination: Blood pressure 150/101, pulse 70, resp. rate 14, weight 99.8 kg (220 lb 0.3 oz), SpO2 96 %.  Neurologic Examination: Ment: Initially with impaired repetition and comrehension, followed by almost immediate recovery to normal language function. Naming intact. Alert and oriented x 4. Pleasant and cooperative.  CN: PERRL, visual fields intact,  EOMI. No nystagmus. Normal facial sensation.  VII - equal bilaterally. Tongue protrudes midline. Shoulder shrug normal bilaterally.  Motor: 5/5 strength x 4 with some giveway weakness that does not appear physiological and is accompanied by non-physiologic appearing tremulousness.  Sensory: Decreased pinprick and temperature sensation RUE. Decreased fine touch and temperature sensation medial left leg.  Cerebellar: No ataxia on FNF.  Reflexes: 3+ patellae bilaterally. Otherwise 2+ and symmetric.  Gait: Deferred.   Results for orders placed or performed during the hospital encounter of 03/05/15 (from the past 48 hour(s))  Protime-INR     Status: None   Collection Time: 03/05/15 10:27 AM  Result Value Ref Range   Prothrombin Time 13.6 11.6 - 15.2 seconds   INR 1.02 0.00 - 1.49  APTT     Status: None   Collection Time: 03/05/15 10:27 AM  Result Value Ref Range   aPTT 33 24 - 37 seconds  CBC     Status:  None   Collection Time: 03/05/15 10:27 AM  Result Value Ref Range   WBC 7.9 4.0 - 10.5 K/uL   RBC 4.85 4.22 - 5.81 MIL/uL   Hemoglobin 15.1 13.0 - 17.0 g/dL   HCT 45.6 39.0 - 52.0 %   MCV 94.0 78.0 - 100.0 fL   MCH 31.1 26.0 - 34.0 pg   MCHC 33.1 30.0 - 36.0 g/dL   RDW 14.0 11.5 - 15.5 %   Platelets 183 150 - 400 K/uL  Differential     Status: None   Collection Time: 03/05/15 10:27 AM  Result Value Ref Range   Neutrophils Relative % 57 %   Neutro Abs 4.5 1.7 - 7.7 K/uL   Lymphocytes Relative 29 %   Lymphs Abs 2.3 0.7 - 4.0 K/uL   Monocytes Relative 10 %   Monocytes Absolute 0.8 0.1 - 1.0 K/uL   Eosinophils Relative 3 %   Eosinophils Absolute 0.2 0.0 - 0.7 K/uL   Basophils Relative 1 %   Basophils Absolute 0.1 0.0 - 0.1 K/uL  Comprehensive metabolic panel     Status: Abnormal   Collection Time: 03/05/15 10:27 AM  Result Value Ref Range   Sodium 140 135 - 145 mmol/L   Potassium 4.2 3.5 - 5.1 mmol/L   Chloride 108 101 - 111 mmol/L   CO2 23 22 - 32 mmol/L   Glucose, Bld 124 (H) 65 - 99 mg/dL   BUN 12 6 - 20 mg/dL   Creatinine, Ser 0.95 0.61 - 1.24 mg/dL   Calcium 9.7 8.9 - 10.3 mg/dL   Total Protein 6.8 6.5 - 8.1 g/dL   Albumin 4.2 3.5 - 5.0 g/dL   AST 22 15 - 41 U/L   ALT 22 17 - 63 U/L   Alkaline Phosphatase 65 38 - 126 U/L   Total Bilirubin 1.0 0.3 - 1.2 mg/dL   GFR calc non Af Amer >60 >60 mL/min   GFR calc Af Amer >60 >60 mL/min    Comment: (NOTE) The eGFR has been calculated using the CKD EPI equation. This calculation has not been validated in all clinical situations. eGFR's persistently <60 mL/min signify possible Chronic Kidney Disease.    Anion gap 9 5 - 15  I-stat troponin, ED (not at [facility])     Status: None   Collection Time: 03/05/15 10:31 AM  Result Value Ref Range   Troponin i, poc 0.00 0.00 - 0.08 ng/mL   Comment 3  Comment: Due to the release kinetics of cTnI, a negative result within the first hours of the onset of symptoms does  not rule out myocardial infarction with certainty. If myocardial infarction is still suspected, repeat the test at appropriate intervals.   I-Stat Chem 8, ED  (not at [facility])     Status: Abnormal   Collection Time: 03/05/15 10:33 AM  Result Value Ref Range   Sodium 141 135 - 145 mmol/L   Potassium 4.1 3.5 - 5.1 mmol/L   Chloride 104 101 - 111 mmol/L   BUN 14 6 - 20 mg/dL   Creatinine, Ser 0.90 0.61 - 1.24 mg/dL   Glucose, Bld 121 (H) 65 - 99 mg/dL   Calcium, Ion 1.16 1.13 - 1.30 mmol/L   TCO2 23 0 - 100 mmol/L   Hemoglobin 16.3 13.0 - 17.0 g/dL   HCT 48.0 39.0 - 52.0 %  CBG monitoring, ED     Status: Abnormal   Collection Time: 03/05/15 10:47 AM  Result Value Ref Range   Glucose-Capillary 115 (H) 65 - 99 mg/dL   Ct Head Wo Contrast  03/05/2015  CLINICAL DATA:  Right hand numbness and weakness. EXAM: CT HEAD WITHOUT CONTRAST TECHNIQUE: Contiguous axial images were obtained from the base of the skull through the vertex without intravenous contrast. COMPARISON:  None. FINDINGS: No acute intracranial abnormality. Specifically, no hemorrhage, hydrocephalus, mass lesion, acute infarction, or significant intracranial injury. No acute calvarial abnormality. Visualized paranasal sinuses and mastoids clear. Orbital soft tissues unremarkable. IMPRESSION: No acute intracranial abnormality. Electronically Signed   By: Rolm Baptise M.D.   On: 03/05/2015 10:38    Assessment: 70 y.o. male with probable TIA. Symptoms best localize to left parieto-frontal region in distal MCA territory.   Plan: 1. HgbA1c, fasting lipid panel 2. MRI, MRA  of the brain without contrast 3. PT consult, OT consult, Speech consult 4. Echocardiogram 5. Carotid dopplers 6. Prophylactic therapy. 7. Risk factor modification 8. Telemetry monitoring 9. Frequent neuro checks 10. Switch ASA to Aggrenox.   '@Electronically'  signed: Dr. Kerney Elbe   '[email or web address]' @ 03/05/2015, 12:06 PM

## 2015-03-05 NOTE — Progress Notes (Signed)
MRI demonstrates Tiny acute nonhemorrhagic infarcts left motor strip and left postcentral gyrus.  Erin Hearing, ANP

## 2015-03-05 NOTE — ED Notes (Signed)
Pt states Valium is not working for him, and he "usually need two of them" in order to be able to go through his MRI.  Will page NP

## 2015-03-05 NOTE — ED Notes (Signed)
Ok for pt to have clear liquid diet per Dr. Jeanell Sparrow

## 2015-03-05 NOTE — ED Notes (Signed)
Pt up to restroom.  Gait steady and even.   

## 2015-03-05 NOTE — ED Notes (Signed)
Ordered pt a clear liquid diet lunch tray, per [employer]

## 2015-03-05 NOTE — H&P (Signed)
[facility] History and Physical                                                                                    Joshua [name], is a 70 y.o. male  MRN: [id number]   DOB - May 05, 1945  Admit Date - 03/05/2015  Outpatient Primary MD for the patient is Marton Redwood, MD  Referring MD: Maceo Pro  Consulting M.D: Earnestine Leys /Neurology  PMH: Past Medical History  Diagnosis Date  . Vertigo   . Sleep apnea     pt denies  . Palpitations     Benign PVCs  . Dyslipidemia   . GERD (gastroesophageal reflux disease)   . Chest pain     Coronaries normal 1996 /  nuclear..06/2002..normal...EF  56% /  stress echo.. May, 2011.... no  scar or ischemia... rate related RBBB  . RBBB (right bundle branch block)     rate related  . Hx of colonoscopy   . Ejection fraction     EF 60%, stress echo, May, 2011  . Carotid artery disease ([place])     Carotid Doppler normal August, 2007  . Tremor     Hand tremor  . Diverticulosis   . Shingles   . IFG (impaired fasting glucose)   . Allergic rhinitis   . Hx of colonic polyps     adenomatous  . Asthma   . Anal fissure   . Arthritis   . Allergy   . Anxiety     from chronic pain from surgery- on Cymbalta  . Cataract   . Hyperlipidemia   . HTN (hypertension)     takes Metoprolol for PVC control      PSH: Past Surgical History  Procedure Laterality Date  . Back surgery  2002,2009    x 6  . Neck surgery  2002  . Hernia repair      laprascopic  . Rotator cuff repair Left   . Cholecystectomy  11/30/2012    with IOC  . Upper gastrointestinal endoscopy    . Colonoscopy       CC:  Chief Complaint  Patient presents with  . Code Stroke     HPI: 70 year old male patient with history of sleep apnea, dyslipidemia, hypertension, chronic right bundle branch block as well as reflux. Patient presented to the hospital with severe headache beginning today associated with right upper extremity weakness. Earlier this morning patient developed a  headache and sometime  later (patient and wife unable to quantify) patient had difficulty using his right hand. He did not have any numbness or tingling in the hand just difficulty moving the hand and using it. About 2 months ago he had a similar symptom where he dropped an item on the floor and was having difficulty using his arm to recover the item but this resolved rapidly. Patient denies any visual disturbances but states in the past years had what he describes as a "tinfoil effect" associated with headache-she was told that this was evidence of a complex migraine. He has not had any other symptomatology such as recent coughs fevers chills. Since arrival his neurological symptoms have been waxing and waning but have  not resolved completely. The wife may have also noticed some facial drooping. His also had some issues of immediate dyspnea on exertion with activity beginning about a month ago which has now resolved. He has not had any lower extremity swelling or been on any recent long trips that would put him at risk for peripheral DVT.  ER Evaluation and treatment: Temperature 98.1, BP 138/84, pulse 93 and respirations 12, room air saturations 95%  EKG: Sinus rhythm with ventricular rate 74 bpm, QTC 420 ms, unifocal PVCs in a trigeminal pattern, no acute ischemic changes, no definitive right bundle branch block CT head without contrast: No acute intracranial abnormality Laboratory data: Na 141, K 4.1, BUN 14, Cr 0.90, glucose 121, troponin 0.00, WBC 7900 with hemoglobin 15.1 and platelets 183,000, PT 13.6, INR 1.02, PTT 33   Review of Systems   In addition to the HPI above,  No Fever-chills, myalgias or other constitutional symptoms No changes with Vision or hearing, new tingling, numbness in any extremity, No problems swallowing food or Liquids, indigestion/reflux No Chest pain, Cough or Shortness of Breath, palpitations, orthopnea or DOE No Abdominal pain, N/V; no melena or hematochezia, no dark  tarry stools No dysuria, hematuria or flank pain No new skin rashes, lesions, masses or bruises, No new joints pains-aches No recent weight gain or loss No polyuria, polydypsia or polyphagia,  *A full 10 point Review of Systems was done, except as stated above, all other Review of Systems were negative.  Social History Social History  Substance Use Topics  . Smoking status: Never Smoker   . Smokeless tobacco: Never Used  . Alcohol Use: No    Resides at: Private residence  Lives with: Spouse  Ambulatory status: Without assistive devices   Family History Family History  Problem Relation Age of Onset  . Heart attack Father   . Heart disease Father   . Clotting disorder Father   . Heart disease Brother   . Hypertension Mother   . Breast cancer Paternal Grandmother   . Cancer Paternal Grandmother     breast  . Heart attack Paternal Grandfather   . Colon cancer Neg Hx   . Esophageal cancer Neg Hx   . Stomach cancer Neg Hx   . Rectal cancer Neg Hx      Prior to Admission medications   Medication Sig Start Date End Date Taking? Authorizing Provider  albuterol (PROVENTIL HFA;VENTOLIN HFA) 108 (90 BASE) MCG/ACT inhaler Inhale 2 puffs into the lungs as needed for wheezing.   Yes Historical Provider, MD  aspirin 81 MG tablet Take 81 mg by mouth daily.     Yes Historical Provider, MD  Cyanocobalamin (RA VITAMIN B-12 TR) 1000 MCG TBCR Take 1,000 mg by mouth daily.   Yes Historical Provider, MD  Docusate Calcium (STOOL SOFTENER PO) Take by mouth 2 (two) times daily.   Yes Historical Provider, MD  DULoxetine (CYMBALTA) 60 MG capsule Take 60 mg by mouth daily.   Yes Historical Provider, MD  DYMISTA 137-50 MCG/ACT SUSP Place 1 puff into both nostrils at bedtime. One spray each nostril twice a day. 03/24/11  Yes Historical Provider, MD  finasteride (PROSCAR) 5 MG tablet Take 5 mg by mouth Daily. 04/17/11  Yes Historical Provider, MD  fish oil-omega-3 fatty acids 1000 MG capsule Take 1 g  by mouth daily. 1200 mg daily   Yes Historical Provider, MD  ibuprofen (ADVIL,MOTRIN) 800 MG tablet Take 400 mg by mouth daily. Reported on 03/05/2015   Yes Historical  Provider, MD  lansoprazole (PREVACID) 30 MG capsule TAKE ONE CAPSULE BY MOUTH TWICE A DAY 10/17/14  Yes Irene Shipper, MD  levocetirizine (XYZAL) 5 MG tablet Take 5 mg by mouth every evening.     Yes Historical Provider, MD  metoprolol (TOPROL-XL) 50 MG 24 hr tablet Take 50 mg by mouth daily.    Yes Historical Provider, MD  montelukast (SINGULAIR) 10 MG tablet Take 10 mg by mouth at bedtime.     Yes Historical Provider, MD  rosuvastatin (CRESTOR) 10 MG tablet Take 5 mg by mouth daily. Take 1/2 tablet daily   Yes Historical Provider, MD  tamsulosin (FLOMAX) 0.4 MG CAPS capsule Take 0.4 mg by mouth daily after supper.  05/25/13  Yes Historical Provider, MD  zolpidem (AMBIEN) 10 MG tablet Take 10 mg by mouth at bedtime. 05/13/11  Yes Historical Provider, MD    Allergies  Allergen Reactions  . Floxin [Ofloxacin]     Lowers BP    Physical Exam  Vitals  Blood pressure 147/94, pulse 75, temperature 98.1 F (36.7 C), resp. rate 10, weight 220 lb 0.3 oz (99.8 kg), SpO2 97 %.   General:  In no acute distress, appears healthy and well nourished  Psych:  Normal affect, Denies Suicidal or Homicidal ideations, Awake Alert, Oriented X 3. Speech and thought patterns are clear and appropriate, no apparent short term memory deficits  Neuro: CN II through XII intact does have some mild subtle right facial droop, Strength 5/5 x 3 extremities is notable weakness involving the right upper extremity with both extensor and flexion strength-also noted with decreased fine motor activity in the fingers of the right hand, Sensation intact all 4 extremities.  ENT:  Ears and Eyes appear Normal, Conjunctivae clear, PER. Moist oral mucosa without erythema or exudates.  Neck:  Supple, No lymphadenopathy appreciated  Respiratory:  Symmetrical chest wall  movement, Good air movement bilaterally, CTAB. Room Air  Cardiac:  RRR, No Murmurs, no LE edema noted, no JVD, No carotid bruits, peripheral pulses palpable at 2+  Abdomen:  Positive bowel sounds, Soft, Non tender, Non distended,  No masses appreciated, no obvious hepatosplenomegaly  Skin:  No Cyanosis, Normal Skin Turgor, No Skin Rash or Bruise.  Extremities: Symmetrical without obvious trauma or injury,  no effusions.  Data Review  CBC  Recent Labs Lab 03/05/15 1027 03/05/15 1033  WBC 7.9  --   HGB 15.1 16.3  HCT 45.6 48.0  PLT 183  --   MCV 94.0  --   MCH 31.1  --   MCHC 33.1  --   RDW 14.0  --   LYMPHSABS 2.3  --   MONOABS 0.8  --   EOSABS 0.2  --   BASOSABS 0.1  --     Chemistries   Recent Labs Lab 03/05/15 1027 03/05/15 1033  NA 140 141  K 4.2 4.1  CL 108 104  CO2 23  --   GLUCOSE 124* 121*  BUN 12 14  CREATININE 0.95 0.90  CALCIUM 9.7  --   AST 22  --   ALT 22  --   ALKPHOS 65  --   BILITOT 1.0  --     CrCl cannot be calculated (Unknown ideal weight.).  No results for input(s): TSH, T4TOTAL, T3FREE, THYROIDAB in the last 72 hours.  Invalid input(s): FREET3  Coagulation profile  Recent Labs Lab 03/05/15 1027  INR 1.02    No results for input(s): DDIMER in the last 72 hours.  Cardiac Enzymes No results for input(s): CKMB, TROPONINI, MYOGLOBIN in the last 168 hours.  Invalid input(s): CK  Invalid input(s): POCBNP  Urinalysis No results found for: COLORURINE, APPEARANCEUR, LABSPEC, PHURINE, GLUCOSEU, HGBUR, BILIRUBINUR, KETONESUR, PROTEINUR, UROBILINOGEN, NITRITE, LEUKOCYTESUR  Imaging results:   Ct Head Wo Contrast  03/05/2015  CLINICAL DATA:  Right hand numbness and weakness. EXAM: CT HEAD WITHOUT CONTRAST TECHNIQUE: Contiguous axial images were obtained from the base of the skull through the vertex without intravenous contrast. COMPARISON:  None. FINDINGS: No acute intracranial abnormality. Specifically, no hemorrhage,  hydrocephalus, mass lesion, acute infarction, or significant intracranial injury. No acute calvarial abnormality. Visualized paranasal sinuses and mastoids clear. Orbital soft tissues unremarkable. IMPRESSION: No acute intracranial abnormality. Electronically Signed   By: Rolm Baptise M.D.   On: 03/05/2015 10:38     EKG: (Independently reviewed) Sinus rhythm with ventricular rate 74 bpm, QTC 420 ms, unifocal PVCs in a trigeminal pattern, no acute ischemic changes, no definitive right bundle branch block   Assessment & Plan  Principal Problem:   CVA vs TIA -Has had waxing and waning of right upper extremity and hand weakness associated headache differential includes: acute CVA versus complex migraine versus TIA -Admit to telemetry/Obs -Routine stroke evaluation to include MRI/MRA brain, carotid duplex, and echocardiogram (requested sedation for MRI and has typically done well with Valium in the past so 10 mg by mouth ordered since this is typical dose he requires) -Lipid panel and hemoglobin A1c -PT/OT/SLP evaluation -Antiplatelet with aspirin home dose was 81 mg so will increase to full dose at this juncture  Active Problems:   HTN  -Currently controlled with home medications -Continue Toprol -Also on Flomax for BPH symptoms    Dyslipidemia -Continue preadmission Crestor and Fish oil    RBBB  -Current EKG without evidence of bundle branch block    Sleep apnea -Continue preadmission nocturnal CPAP    GERD  -Continue preadmission PPI    DVT Prophylaxis: Lovenox  Family Communication:   Wife at bedside  Code Status:  Full code  Condition:  Stable  Discharge disposition: Once medically stable and pending PT/OT evaluation and anticipate discharge back to previous home environment  Time spent in minutes : 60      Kellee Sittner L. ANP on 03/05/2015 at 3:09 PM  You may contact me by going to [email or web address] - password [email or web address]  I am available from 7a-7p but please confirm I  am on the schedule by going to Amion as above.   After 7p please contact night coverage person covering me after hours  [facility]

## 2015-03-05 NOTE — Code Documentation (Signed)
70yo male arriving to [facility] via private vehicle at 28.  Patient's wife reports that he was having difficulty using his right hand at 0815 when trying to adjust the blinds.  Patient reports headache.  Patient with h/o neck and lower back problems and tremors.  Code stroke called on patient arrival.  Patient to CT.  Stroke team to the bedside.  NIHSS 3, see documentation for details and code stroke times.  Dr. Cheral Marker to the bedside.  NIHSS now 1, decreased sensation in the right arm and left leg (which is baseline per patient).  Patient with left leg weakness at baseline d/t back problems.  No acute stroke treatment at this time d/t mild symptoms per MD.  Patient remains in the window to treat with tPA until 1245 should symptoms worsen.  Bedside handoff with ED RN Jerene Pitch.

## 2015-03-06 ENCOUNTER — Other Ambulatory Visit (HOSPITAL_COMMUNITY): Payer: Medicare HMO

## 2015-03-06 ENCOUNTER — Encounter (HOSPITAL_COMMUNITY): Payer: Self-pay | Admitting: General Practice

## 2015-03-06 DIAGNOSIS — G8321 Monoplegia of upper limb affecting right dominant side: Secondary | ICD-10-CM | POA: Diagnosis not present

## 2015-03-06 DIAGNOSIS — J45909 Unspecified asthma, uncomplicated: Secondary | ICD-10-CM | POA: Diagnosis not present

## 2015-03-06 DIAGNOSIS — E785 Hyperlipidemia, unspecified: Secondary | ICD-10-CM | POA: Diagnosis not present

## 2015-03-06 DIAGNOSIS — I10 Essential (primary) hypertension: Secondary | ICD-10-CM | POA: Diagnosis not present

## 2015-03-06 DIAGNOSIS — R69 Illness, unspecified: Secondary | ICD-10-CM | POA: Diagnosis not present

## 2015-03-06 DIAGNOSIS — Z888 Allergy status to other drugs, medicaments and biological substances status: Secondary | ICD-10-CM | POA: Diagnosis not present

## 2015-03-06 DIAGNOSIS — R29810 Facial weakness: Secondary | ICD-10-CM | POA: Diagnosis not present

## 2015-03-06 DIAGNOSIS — G4730 Sleep apnea, unspecified: Secondary | ICD-10-CM | POA: Diagnosis not present

## 2015-03-06 DIAGNOSIS — F419 Anxiety disorder, unspecified: Secondary | ICD-10-CM | POA: Diagnosis present

## 2015-03-06 DIAGNOSIS — I638 Other cerebral infarction: Secondary | ICD-10-CM | POA: Diagnosis not present

## 2015-03-06 DIAGNOSIS — N401 Enlarged prostate with lower urinary tract symptoms: Secondary | ICD-10-CM | POA: Diagnosis not present

## 2015-03-06 DIAGNOSIS — Z8249 Family history of ischemic heart disease and other diseases of the circulatory system: Secondary | ICD-10-CM | POA: Diagnosis not present

## 2015-03-06 DIAGNOSIS — I6330 Cerebral infarction due to thrombosis of unspecified cerebral artery: Secondary | ICD-10-CM | POA: Diagnosis not present

## 2015-03-06 DIAGNOSIS — I639 Cerebral infarction, unspecified: Secondary | ICD-10-CM | POA: Diagnosis not present

## 2015-03-06 DIAGNOSIS — R29703 NIHSS score 3: Secondary | ICD-10-CM | POA: Diagnosis present

## 2015-03-06 DIAGNOSIS — Z6829 Body mass index (BMI) 29.0-29.9, adult: Secondary | ICD-10-CM | POA: Diagnosis not present

## 2015-03-06 DIAGNOSIS — R531 Weakness: Secondary | ICD-10-CM | POA: Diagnosis present

## 2015-03-06 DIAGNOSIS — Z7982 Long term (current) use of aspirin: Secondary | ICD-10-CM | POA: Diagnosis not present

## 2015-03-06 DIAGNOSIS — E669 Obesity, unspecified: Secondary | ICD-10-CM | POA: Diagnosis present

## 2015-03-06 DIAGNOSIS — K219 Gastro-esophageal reflux disease without esophagitis: Secondary | ICD-10-CM | POA: Diagnosis not present

## 2015-03-06 LAB — LIPID PANEL
Cholesterol: 137 mg/dL (ref 0–200)
HDL: 41 mg/dL (ref >40)
LDL Cholesterol: 75 mg/dL (ref 0–99)
Total CHOL/HDL Ratio: 3.3 RATIO
Triglycerides: 105 mg/dL (ref <150)
VLDL: 21 mg/dL (ref 0–40)

## 2015-03-06 MED ORDER — GUAIFENESIN-DM 100-10 MG/5ML PO SYRP
5.0000 mL | ORAL_SOLUTION | ORAL | Status: DC | PRN
  Administered 2015-03-06: 5 mL via ORAL
  Filled 2015-03-06: qty 5

## 2015-03-06 NOTE — Evaluation (Signed)
Occupational Therapy Evaluation Patient Details Name: Joshua [name] MRN: [id number] DOB: 06-14-1945 Today's Date: 03/06/2015    History of Present Illness Patient is a 70 yo male adm to [facility] with hand weakness and numbness. 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 PMH + for RBBB, vertigo, HLD, asthma, back surgery x 5 and reports current HNP at L4/5 awaiting another surgery; has L "foot drop".   Clinical Impression   Pt reports he was independent with ADLs and mobility PTA. Currently pt is overall at a mod I level for ADLs and functional mobility. Pt reports that he currently feels back to baseline level of function with RUE weakness/decreased sensation resolved. All education complete, pt with no further acute OT needs identified. Will sign off for OT at this time. Thank you for this referral.    Follow Up Recommendations  No OT follow up;Supervision - Intermittent    Equipment Recommendations  None recommended by OT    Recommendations for Other Services       Precautions / Restrictions Precautions Precautions: None Required Braces or Orthoses: Other Brace/Splint Other Brace/Splint: reports wears L AFO Restrictions Weight Bearing Restrictions: No      Mobility Bed Mobility Overal bed mobility: Independent                Transfers Overall transfer level: Independent Equipment used: None                  Balance Overall balance assessment: Modified Independent                                          ADL Overall ADL's : Modified independent;At baseline                                       General ADL Comments: Pts wife present for OT eval. Pt reports he feels like he has returned to baseline with R UE weakness/decreased sensation. Discussed supervision with shower transfer if needed and sitting down on bench for showering if pt begins to feel weak; pt and wife verbalized understanding. Educated on home safety.      Vision Vision Assessment?: No apparent visual deficits   Perception     Praxis      Pertinent Vitals/Pain Pain Assessment: No/denies pain     Hand Dominance Right   Extremity/Trunk Assessment Upper Extremity Assessment Upper Extremity Assessment: Overall WFL for tasks assessed   Lower Extremity Assessment Lower Extremity Assessment: Defer to PT evaluation   Cervical / Trunk Assessment Cervical / Trunk Assessment: Normal   Communication Communication Communication: No difficulties   Cognition Arousal/Alertness: Awake/alert Behavior During Therapy: WFL for tasks assessed/performed Overall Cognitive Status: Within Functional Limits for tasks assessed                     General Comments       Exercises       Shoulder Instructions      Home Living Family/patient expects to be discharged to:: Private residence Living Arrangements: Spouse/significant other Available Help at Discharge: Family;Available 24 hours/day Type of Home: House Home Access: Stairs to enter [employer] of Steps: 4 Entrance Stairs-Rails: Left Home Layout: One level     Bathroom Shower/Tub: Occupational psychologist: Handicapped height  Home Equipment: [place] - 2 wheels;Shower seat - built in;Grab bars - tub/shower          Prior Functioning/Environment Level of Independence: Independent             OT Diagnosis: Generalized weakness   OT Problem List:     OT Treatment/Interventions:      OT Goals(Current goals can be found in the care plan section) Acute Rehab OT Goals OT Goal Formulation: All assessment and education complete, DC therapy  OT Frequency:     Barriers to D/C:            Co-evaluation              End of Session    Activity Tolerance: Patient tolerated treatment well Patient left: in chair;with call bell/phone within reach;with family/visitor present   Time: [id number] OT Time Calculation (min): 12  min Charges:  OT General Charges $OT Visit: 1 Procedure OT Evaluation $OT Eval Low Complexity: 1 Procedure G-Codes:     Binnie Kand M.S., OTR/L Pager: [phone]  03/06/2015, 3:46 PM
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
## 2015-03-06 NOTE — Progress Notes (Signed)
Patient has refused use of CPAP. RT will continue to monitor as needed.

## 2015-03-06 NOTE — Progress Notes (Signed)
[facility] PROGRESS NOTE  Joshua [name] [id number] DOB: 25-Mar-1945 DOA: 03/05/2015 PCP: Marton Redwood, MD  Assessment/Plan: 1. CVA - MRI brain showed tiny acute non-hemorrhagic infarct left motor strip and left postcentral gyrus. Continue aspirin 325 mg by mouth daily. Neurology is following. Stroke workup underway.  2. Hypertension - continue Toprol, blood pressure is controlled. 3. Hyperlipidemia- continue Crestor, fish oil 4. Sleep apnea- continue nocturnal C Pap 5. GERD- continue PPI  Status:  full code  Family Communication: discussed with patient's wife at bedside  Disposition Plan: home after workup for stroke is completed   Consultants:neurology     Antibiotics:   none   HPI/Subjective: 70 year old male patient with history of sleep apnea, dyslipidemia, hypertension, chronic right bundle branch block as well as reflux. Patient presented to the hospital with severe headache beginning today associated with right upper extremity weakness. Earlier this morning patient developed a headache and sometime later (patient and wife unable to quantify) patient had difficulty using his right hand. He did not have any numbness or tingling in the hand just difficulty moving the hand and using it.  Patient denies any weakness or numbness of the hand. He is back to baseline   Objective: Filed Vitals:   03/06/15 1025 03/06/15 1452  BP: 115/78 101/67  Pulse: 70 50  Temp: 98.3 F (36.8 C) 98 F (36.7 C)  Resp: 16 18   No intake or output data in the 24 hours ending 03/06/15 1719 Filed Weights   03/05/15 1043  Weight: 99.8 kg (220 lb 0.3 oz)    Exam:   General:  appears in no acute distress   Cardiovascular: S1-S2 regular   Respiratory: clear to auscultation bilaterally   Abdomen: soft, nontender, no organomegaly   Musculoskeletal:  no cyanosis/clubbing/edema of the lower extremities   Data Reviewed: Basic Metabolic Panel:  Recent Labs Lab 03/05/15 1027  03/05/15 1033  NA 140 141  K 4.2 4.1  CL 108 104  CO2 23  --   GLUCOSE 124* 121*  BUN 12 14  CREATININE 0.95 0.90  CALCIUM 9.7  --    Liver Function Tests:  Recent Labs Lab 03/05/15 1027  AST 22  ALT 22  ALKPHOS 65  BILITOT 1.0  PROT 6.8  ALBUMIN 4.2   No results for input(s): LIPASE, AMYLASE in the last 168 hours. No results for input(s): AMMONIA in the last 168 hours. CBC:  Recent Labs Lab 03/05/15 1027 03/05/15 1033  WBC 7.9  --   NEUTROABS 4.5  --   HGB 15.1 16.3  HCT 45.6 48.0  MCV 94.0  --   PLT 183  --     CBG:  Recent Labs Lab 03/05/15 1047  GLUCAP 115*    No results found for this or any previous visit (from the past 240 hour(s)).   Studies: Ct Head Wo Contrast  03/05/2015  CLINICAL DATA:  Right hand numbness and weakness. EXAM: CT HEAD WITHOUT CONTRAST TECHNIQUE: Contiguous axial images were obtained from the base of the skull through the vertex without intravenous contrast. COMPARISON:  None. FINDINGS: No acute intracranial abnormality. Specifically, no hemorrhage, hydrocephalus, mass lesion, acute infarction, or significant intracranial injury. No acute calvarial abnormality. Visualized paranasal sinuses and mastoids clear. Orbital soft tissues unremarkable. IMPRESSION: No acute intracranial abnormality. Electronically Signed   By: Rolm Baptise M.D.   On: 03/05/2015 10:38   Mr Joshua [name] Wo Contrast  03/05/2015  CLINICAL DATA:  70 year old hypertensive male with hyperlipidemia presenting with headache with associated  right upper extremity weakness. Difficulty using right hand. Similar symptoms 2 months ago which resolved. Symptoms have waxed and waned. White noticed a facial drooping. Subsequent encounter. EXAM: MRI HEAD WITHOUT CONTRAST MRA HEAD WITHOUT CONTRAST TECHNIQUE: Multiplanar, multiecho pulse sequences of the brain and surrounding structures were obtained without intravenous contrast. Angiographic images of the head were obtained using MRA  technique without contrast. COMPARISON:  03/05/2015 head CT.  No comparison brain MR. FINDINGS: MRI HEAD FINDINGS Tiny acute nonhemorrhagic infarcts left motor strip and left postcentral gyrus. Minimal blood breakdown products posterior right lenticular nucleus versus mineral deposition. Otherwise no evidence of intracranial hemorrhage. Moderate patchy and punctate white matter changes most notable periventricular region consistent with result of chronic microvascular changes. Mild global atrophy without hydrocephalus. No intracranial mass lesion noted on this unenhanced exam. Mild exophthalmos. Degenerative changes occiput-C1 junction. Cervical medullary junction unremarkable. Partially empty sella incidentally noted. Minimal paranasal sinus mucosal thickening. MRA HEAD FINDINGS Anterior circulation without medium or large size vessel significant stenosis or occlusion. Mild middle cerebral artery branch vessel irregularity bilaterally. Ectatic right internal carotid artery cervical segment. Slight irregularity distal vertebral arteries without significant narrowing. No significant narrowing of the basilar artery. Nonvisualized anterior inferior cerebellar arteries with large posterior inferior cerebellar arteries bilaterally. Mild irregularity superior cerebellar arteries. Mild irregularity distal left posterior cerebral artery branch. No aneurysm noted. IMPRESSION: MRI HEAD Tiny acute nonhemorrhagic infarcts left motor strip and left postcentral gyrus. Moderate patchy and punctate white matter changes most notable periventricular region consistent with result of chronic microvascular changes. Mild global atrophy without hydrocephalus. Mild exophthalmos. Degenerative changes occiput-C1 junction. MRA HEAD Anterior circulation without medium or large size vessel significant stenosis or occlusion. Mild middle cerebral artery branch vessel irregularity bilaterally. Slight irregularity distal vertebral arteries without  significant narrowing. Nonvisualized anterior inferior cerebellar arteries with large posterior inferior cerebellar arteries bilaterally. Mild irregularity superior cerebellar arteries. Electronically Signed   By: Genia Del M.D.   On: 03/05/2015 17:48   Mr Brain Wo Contrast  03/05/2015  CLINICAL DATA:  70 year old hypertensive male with hyperlipidemia presenting with headache with associated right upper extremity weakness. Difficulty using right hand. Similar symptoms 2 months ago which resolved. Symptoms have waxed and waned. White noticed a facial drooping. Subsequent encounter. EXAM: MRI HEAD WITHOUT CONTRAST MRA HEAD WITHOUT CONTRAST TECHNIQUE: Multiplanar, multiecho pulse sequences of the brain and surrounding structures were obtained without intravenous contrast. Angiographic images of the head were obtained using MRA technique without contrast. COMPARISON:  03/05/2015 head CT.  No comparison brain MR. FINDINGS: MRI HEAD FINDINGS Tiny acute nonhemorrhagic infarcts left motor strip and left postcentral gyrus. Minimal blood breakdown products posterior right lenticular nucleus versus mineral deposition. Otherwise no evidence of intracranial hemorrhage. Moderate patchy and punctate white matter changes most notable periventricular region consistent with result of chronic microvascular changes. Mild global atrophy without hydrocephalus. No intracranial mass lesion noted on this unenhanced exam. Mild exophthalmos. Degenerative changes occiput-C1 junction. Cervical medullary junction unremarkable. Partially empty sella incidentally noted. Minimal paranasal sinus mucosal thickening. MRA HEAD FINDINGS Anterior circulation without medium or large size vessel significant stenosis or occlusion. Mild middle cerebral artery branch vessel irregularity bilaterally. Ectatic right internal carotid artery cervical segment. Slight irregularity distal vertebral arteries without significant narrowing. No significant  narrowing of the basilar artery. Nonvisualized anterior inferior cerebellar arteries with large posterior inferior cerebellar arteries bilaterally. Mild irregularity superior cerebellar arteries. Mild irregularity distal left posterior cerebral artery branch. No aneurysm noted. IMPRESSION: MRI HEAD Tiny acute nonhemorrhagic infarcts left motor  strip and left postcentral gyrus. Moderate patchy and punctate white matter changes most notable periventricular region consistent with result of chronic microvascular changes. Mild global atrophy without hydrocephalus. Mild exophthalmos. Degenerative changes occiput-C1 junction. MRA HEAD Anterior circulation without medium or large size vessel significant stenosis or occlusion. Mild middle cerebral artery branch vessel irregularity bilaterally. Slight irregularity distal vertebral arteries without significant narrowing. Nonvisualized anterior inferior cerebellar arteries with large posterior inferior cerebellar arteries bilaterally. Mild irregularity superior cerebellar arteries. Electronically Signed   By: Genia Del M.D.   On: 03/05/2015 17:48    Scheduled Meds: . aspirin  300 mg Rectal Daily   Or  . aspirin  325 mg Oral Daily  . azelastine  1 spray Each Nare QHS  . DULoxetine  60 mg Oral Daily  . enoxaparin (LOVENOX) injection  40 mg Subcutaneous Q24H  . finasteride  5 mg Oral Daily  . fluticasone  1 spray Each Nare QHS  . loratadine  10 mg Oral QHS  . metoprolol succinate  50 mg Oral Daily  . montelukast  10 mg Oral QHS  . omega-3 acid ethyl esters  1 g Oral Daily  . pantoprazole  20 mg Oral Daily  . rosuvastatin  5 mg Oral Daily  . tamsulosin  0.4 mg Oral QPC supper  . zolpidem  10 mg Oral QHS   Continuous Infusions:   Principal Problem:   Stroke (cerebrum) (HCC) Active Problems:   Sleep apnea   Dyslipidemia   GERD (gastroesophageal reflux disease)   HTN (hypertension)   RBBB (right bundle branch block)   CVA (cerebral infarction)    Acute CVA (cerebrovascular accident) ([place])    Time spent: 79 min    [facility] Pager [phone]. If 7PM-7AM, please contact night-coverage at [email or web address], password [facility] 03/06/2015, 5:19 PM  LOS: 0 days

## 2015-03-06 NOTE — Evaluation (Signed)
Speech Language Pathology Evaluation Patient Details Name: Joshua [name] MRN: [id number] DOB: 09/16/1945 Today's Date: 03/06/2015 Time: 0800-0829 SLP Time Calculation (min) (ACUTE ONLY): 29 min  Problem List:  Patient Active Problem List   Diagnosis Date Noted  . Stroke (cerebrum) ([place]) 03/05/2015  . CVA (cerebral infarction) 03/05/2015  . Weakness of right upper extremity   . Tremor   . Ejection fraction   . Vertigo   . Sleep apnea   . Palpitations   . Dyslipidemia   . GERD (gastroesophageal reflux disease)   . HTN (hypertension)   . Chest pain   . RBBB (right bundle branch block)   . Ejection fraction   . Carotid artery disease [facility])    Past Medical History:  Past Medical History  Diagnosis Date  . Vertigo   . Sleep apnea     pt denies  . Palpitations     Benign PVCs  . Dyslipidemia   . GERD (gastroesophageal reflux disease)   . Chest pain     Coronaries normal 1996 /  nuclear..06/2002..normal...EF  56% /  stress echo.. May, 2011.... no  scar or ischemia... rate related RBBB  . RBBB (right bundle branch block)     rate related  . Hx of colonoscopy   . Ejection fraction     EF 60%, stress echo, May, 2011  . Carotid artery disease ([place])     Carotid Doppler normal August, 2007  . Tremor     Hand tremor  . Diverticulosis   . Shingles   . IFG (impaired fasting glucose)   . Allergic rhinitis   . Hx of colonic polyps     adenomatous  . Asthma   . Anal fissure   . Arthritis   . Allergy   . Anxiety     from chronic pain from surgery- on Cymbalta  . Cataract   . Hyperlipidemia   . HTN (hypertension)     takes Metoprolol for PVC control   Past Surgical History:  Past Surgical History  Procedure Laterality Date  . Back surgery  2002,2009    x 6  . Neck surgery  2002  . Hernia repair      laprascopic  . Rotator cuff repair Left   . Cholecystectomy  11/30/2012    with IOC  . Upper gastrointestinal endoscopy    . Colonoscopy     HPI:  pt is a 70 yo male  adm to [facility] with hand weakness and numbness.  P found to have left motor strip CVA.  PMH + for RBBB, vertigo, HLD, asthma.  Speech eval ordered.    Assessment / Plan / Recommendation Clinical Impression  Pt presents with functional cognitve lingusitic abilities with only deficits on MOCA being in area of memory = retreival.  MOCA Score is 24/30.    Benefiting from category or multiple choice cues with 4/5 words.   SLP provided pt with memory compensation strategies.    SLP educated pt to findings/recommendations.  NO followup indicated.     SLP Assessment  Patient does not need any further Speech Lanaguage Pathology Services    Follow Up Recommendations  None    Frequency and Duration           SLP Evaluation Prior Functioning  Cognitive/Linguistic Baseline: Within functional limits  Lives With: Spouse Vocation: Retired   Associate Professor  Arousal/Alertness: Awake/alert Orientation Level: Oriented X4 Attention: Sustained;Selective Sustained Attention: Appears intact Selective Attention: Appears intact Memory: Impaired Memory Impairment: Retrieval deficit (pt  benefited from category or multiple choice cues with 4/5 words) Awareness: Appears intact Problem Solving: Appears intact Safety/Judgment: Appears intact    Comprehension  Auditory Comprehension Overall Auditory Comprehension: Appears within functional limits for tasks assessed Yes/No Questions: Within Functional Limits Commands: Within Functional Limits Conversation: Complex Interfering Components: Attention Visual Recognition/Discrimination Discrimination: Not tested Reading Comprehension Reading Status: Within funtional limits (for reading calendar on wall, etc)    Expression Expression Primary Mode of Expression: Verbal Verbal Expression Overall Verbal Expression: Appears within functional limits for tasks assessed Initiation: No impairment Repetition: No impairment Naming: No impairment Pragmatics: No  impairment Written Expression Dominant Hand: Right Written Expression: Within Functional Limits   Oral / Motor  Oral Motor/Sensory Function Overall Oral Motor/Sensory Function: Within functional limits Motor Speech Overall Motor Speech: Appears within functional limits for tasks assessed Respiration: Within functional limits Resonance: Within functional limits Articulation: Within functional limitis Motor Planning: Witnin functional limits   GO          Functional Assessment Tool Used: MOCA testing Functional Limitations: Spoken language comprehension Spoken Language Comprehension Current Status [id number]): 0 percent impaired, limited or restricted Spoken Language Comprehension Goal Status ([id number]): 0 percent impaired, limited or restricted Spoken Language Comprehension Discharge Status [phone]): 0 percent impaired, limited or restricted        Joshua [name], [name] SLP [phone]

## 2015-03-06 NOTE — Care Management Note (Signed)
Case Management Note  Patient Details  Name: Joshua [name] MRN: [id number] Date of Birth: 28-Aug-1945  Subjective/Objective:                    Action/Plan: Patient was admitted with CVA.  Will follow for discharge needs pending PT/OT evals and physician orders.  Expected Discharge Date:                  Expected Discharge Plan:     In-House Referral:     Discharge planning Services     Post Acute Care Choice:    Choice offered to:     DME Arranged:    DME Agency:     HH Arranged:    HH Agency:     Status of Service:  In process, will continue to follow  Medicare Important Message Given:    Date Medicare IM Given:    Medicare IM give by:    Date Additional Medicare IM Given:    Additional Medicare Important Message give by:     If discussed at [place] of Stay Meetings, dates discussed:    Additional Comments:  Rolm Baptise, RN 03/06/2015, 11:38 AM [phone]

## 2015-03-06 NOTE — Progress Notes (Signed)
STROKE TEAM PROGRESS NOTE   HISTORY OF PRESENT ILLNESS Joshua [name] is an 70 y.o. male who presents with acute onset of headache on waking this AM, followed by acute onset right hand weakness at 9:00 AM. Weakness waxed and waned in right hand following initial symptoms. Per wife, he may have had some right facial drooping as well. No vision or speech changes.   LSN: 9:00 AM tPA Given: Not administered due to low NIHSS of 1 and rapid improvement. Risks of IV tPA outweigh benefits.    SUBJECTIVE (INTERVAL HISTORY) wife is at bedside. Patient feels the symptoms are improved. He is ambulating with physical therapy and feels much better. Feels his right-sided hand weakness has significantly improved.    OBJECTIVE Temp:  [98.1 F (36.7 C)-99 F (37.2 C)] 99 F (37.2 C) (02/21 2116) Pulse Rate:  [50-108] 68 (02/22 0515) Cardiac Rhythm:  [-] Sinus bradycardia (02/22 0700) Resp:  [10-19] 15 (02/22 0515) BP: (108-165)/(75-101) 108/84 mmHg (02/22 0515) SpO2:  [94 %-100 %] 100 % (02/22 0515) Weight:  [99.8 kg (220 lb 0.3 oz)] 99.8 kg (220 lb 0.3 oz) (02/21 1043)  CBC:  Recent Labs Lab 03/05/15 1027 03/05/15 1033  WBC 7.9  --   NEUTROABS 4.5  --   HGB 15.1 16.3  HCT 45.6 48.0  MCV 94.0  --   PLT 183  --     Basic Metabolic Panel:  Recent Labs Lab 03/05/15 1027 03/05/15 1033  NA 140 141  K 4.2 4.1  CL 108 104  CO2 23  --   GLUCOSE 124* 121*  BUN 12 14  CREATININE 0.95 0.90  CALCIUM 9.7  --     Lipid Panel:    Component Value Date/Time   CHOL 137 03/06/2015 0550   TRIG 105 03/06/2015 0550   HDL 41 03/06/2015 0550   CHOLHDL 3.3 03/06/2015 0550   VLDL 21 03/06/2015 0550   LDLCALC 75 03/06/2015 0550   HgbA1c: No results found for: HGBA1C Urine Drug Screen: No results found for: LABOPIA, COCAINSCRNUR, LABBENZ, AMPHETMU, THCU, LABBARB    IMAGING  Ct Head Wo Contrast 03/05/2015   No acute intracranial abnormality.   Mr [name] Head Wo Contrast 03/05/2015    MRI HEAD   Tiny acute nonhemorrhagic infarcts left motor strip and left postcentral gyrus. Moderate patchy and punctate white matter changes most notable periventricular region consistent with result of chronic microvascular changes. Mild global atrophy without hydrocephalus. Mild exophthalmos. Degenerative changes occiput-C1 junction.  MRA HEAD  Anterior circulation without medium or large size vessel significant stenosis or occlusion. Mild middle cerebral artery branch vessel irregularity bilaterally. Slight irregularity distal vertebral arteries without significant narrowing. Nonvisualized anterior inferior cerebellar arteries with large posterior inferior cerebellar arteries bilaterally. Mild irregularity superior cerebellar arteries.     PHYSICAL EXAM  PHYSICAL EXAM Physical exam: Exam: Gen: NAD Eyes: anicteric sclerae, moist conjunctivae                    CV: no MRG, no carotid bruits, no peripheral edema Mental Status: Alert, follows commands, good historian, oriented to person, date, place  Neuro: Detailed Neurologic Exam  Speech:    No aphasia, no dysarthria  Cranial Nerves:    The pupils are equal, round, and reactive to light.. Attempted, Fundi not visualized.  EOMI. No gaze preference. Facial sensation intact and the muscles of mastication are normal. Visual fields full. Face symmetric, Tongue midline. Hearing intact to voice. Shoulder shrug intact  Motor Observation:  no involuntary movements noted. Tone appears normal.     Strength:    5/5 including bilateral hand grip     Sensation:  Intact to LT and pin prick  Plantars downgoing.   ASSESSMENT/PLAN [name] is a 70 y.o. male with history of dyslipidemia, bundle-branch block, carotid artery disease, asthma, and hypertension presenting with headache and right hand weakness. He did not receive IV t-PA due to minimal deficits.  Strokes:  Dominant infarcts possibly embolic from an unknown etiology.  Resultant   Resolving deficits  MRI - Tiny acute nonhemorrhagic infarcts left motor strip and left postcentral gyrus.  MRA  - anterior circulation without medium or large size vessel significant stenosis or occlusion.  Carotid Doppler - pending  2D Echo - pending  LDL - 75  HgbA1c pending  VTE prophylaxis - Lovenox  Diet Heart Room service appropriate?: Yes; Fluid consistency:: Thin  aspirin 81 mg daily prior to admission, now on aspirin 325 mg daily  Patient counseled to be compliant with his antithrombotic medications  Ongoing aggressive stroke risk factor management  Therapy recommendations: No follow-up physical or speech therapy recommended  Disposition:  Pending  Hypertension  Blood pressure mildly low at times. (Toprol-XL 50 mg daily)  Permissive hypertension (OK if < 220/120) but gradually normalize in 5-7 days  Hyperlipidemia  Home meds:  Crestor 5 mg daily   LDL 75, goal < 70  Continue statin at discharge    Other Stroke Risk Factors  Advanced age  Obesity, Body mass index is 29.03 kg/(m^2).     Other Active Problems  History of carotid artery disease - await carotid Dopplers  Hospital day #   Mikey Bussing PA-C [facility] Pager [phone] 03/06/2015, 10:04 AM    Personally examined patient and images, and have participated in and made any corrections needed to history, physical, neuro exam,assessment and plan as stated above.  I have personally obtained the history, evaluated lab date, reviewed imaging studies and agree with radiology interpretations.  Patient presented with headache and right hand weakness. Patient's symptoms are improving. MRI of the brain shows Tiny acute nonhemorrhagic infarcts left motor strip and left postcentral gyrus. Pending full workup above.   Sarina Ill, MD Stroke Neurology [phone] [facility]   To contact Stroke Continuity provider, please refer to [email or web address] After hours,  contact General Neurology

## 2015-03-07 ENCOUNTER — Inpatient Hospital Stay (HOSPITAL_COMMUNITY): Payer: Medicare HMO

## 2015-03-07 DIAGNOSIS — I639 Cerebral infarction, unspecified: Secondary | ICD-10-CM

## 2015-03-07 LAB — HEMOGLOBIN A1C
Hgb A1c MFr Bld: 5.7 % — ABNORMAL HIGH (ref 4.8–5.6)
Mean Plasma Glucose: 117 mg/dL

## 2015-03-07 MED ORDER — CLOPIDOGREL BISULFATE 75 MG PO TABS: 75.0000 mg | ORAL_TABLET | Freq: Every day | ORAL | Status: DC

## 2015-03-07 NOTE — Progress Notes (Signed)
Pt d/c'd home with spouse. IV removed and telemetry discontinued. Pt given d/c instructions including information on Cardiology wanting TEE and Loop recorder. No further questions at this time. Left unit with volunteer services via wheelchair at 1500. Wendee Copp

## 2015-03-07 NOTE — Progress Notes (Addendum)
*  PRELIMINARY RESULTS* Vascular Ultrasound Carotid Duplex (Doppler) has been completed.   There is no obvious evidence of hemodynamically significant internal carotid artery stenosis bilaterally. The right vertebral artery is patent with antegrade flow. Unable to visualize the left vertebral artery.  03/07/2015 12:03 PM Maudry Mayhew, RVT, RDCS, RDMS

## 2015-03-07 NOTE — Care Management Note (Signed)
Case Management Note  Patient Details  Name: Joshua [name] MRN: [id number] Date of Birth: 26-May-1945  Subjective/Objective:                    Action/Plan: Patient discharging home today with wife. No follow up needs per PT/OT. No further needs per CM.   Expected Discharge Date:                  Expected Discharge Plan:     In-House Referral:     Discharge planning Services     Post Acute Care Choice:    Choice offered to:     DME Arranged:    DME Agency:     HH Arranged:    HH Agency:     Status of Service:  In process, will continue to follow  Medicare Important Message Given:    Date Medicare IM Given:    Medicare IM give by:    Date Additional Medicare IM Given:    Additional Medicare Important Message give by:     If discussed at [place] of Stay Meetings, dates discussed:    Additional Comments:  Pollie Friar, RN 03/07/2015, 3:12 PM

## 2015-03-07 NOTE — Discharge Summary (Signed)
Physician Discharge Summary  Joshua [name] [id number] DOB: 08-09-1945 DOA: 03/05/2015  PCP: Marton Redwood, MD  Admit date: 03/05/2015 Discharge date: 03/07/2015  Time spent: 25 minutes  Recommendations for Outpatient Follow-up:  1. Follow up Neurology in 1 month 2. Will need Loop recorder and TEE as outpatient, called cardiology to set up as outpatient   Discharge Diagnoses:  Principal Problem:   Stroke (cerebrum) ([place]) Active Problems:   Sleep apnea   Dyslipidemia   GERD (gastroesophageal reflux disease)   HTN (hypertension)   RBBB (right bundle branch block)   CVA (cerebral infarction)   Acute CVA (cerebrovascular accident) [facility])   Discharge Condition: Stable  Diet recommendation: Heart healthy  Filed Weights   03/05/15 1043 03/07/15 0200  Weight: 99.8 kg (220 lb 0.3 oz) 99.8 kg (220 lb 0.3 oz)    History of present illness:  70 year old male patient with history of sleep apnea, dyslipidemia, hypertension, chronic right bundle branch block as well as reflux. Patient presented to the hospital with severe headache beginning today associated with right upper extremity weakness. Earlier this morning patient developed a headache and sometime later (patient and wife unable to quantify) patient had difficulty using his right hand. He did not have any numbness or tingling in the hand just difficulty moving the hand and using it.  Hospital Course:  1. CVA - MRI brain showed tiny acute non-hemorrhagic infarct left motor strip and left postcentral gyrus. Continue aspirin 325 mg by mouth daily. Neurology is following. Stroke workup underway.  2. Hypertension - continue Toprol, blood pressure is controlled. 3. Hyperlipidemia- continue Crestor, fish oil 4. Sleep apnea- continue nocturnal C Pap 5. GERD- continue PPI  Procedures:  Echocardiogram  Carotid doppler  Consultations:  Neurology  Discharge Exam: Filed Vitals:   03/07/15 1037 03/07/15 1420  BP: 122/70 131/83   Pulse: 59 70  Temp: 98 F (36.7 C) 98 F (36.7 C)  Resp: 18 18    General: Appears in no acute distress Cardiovascular: S1S2 RRR Respiratory: Clear bilaterally  Discharge Instructions   Discharge Instructions    Diet - low sodium heart healthy    Complete by:  As directed      Increase activity slowly    Complete by:  As directed           Current Discharge Medication List    START taking these medications   Details  clopidogrel (PLAVIX) 75 MG tablet Take 1 tablet (75 mg total) by mouth daily. Qty: 30 tablet, Refills: 2      CONTINUE these medications which have NOT CHANGED   Details  albuterol (PROVENTIL HFA;VENTOLIN HFA) 108 (90 BASE) MCG/ACT inhaler Inhale 2 puffs into the lungs as needed for wheezing.    Cyanocobalamin (RA VITAMIN B-12 TR) 1000 MCG TBCR Take 1,000 mg by mouth daily.    Docusate Calcium (STOOL SOFTENER PO) Take by mouth 2 (two) times daily.    DULoxetine (CYMBALTA) 60 MG capsule Take 60 mg by mouth daily.    DYMISTA 137-50 MCG/ACT SUSP Place 1 puff into both nostrils at bedtime. One spray each nostril twice a day.    finasteride (PROSCAR) 5 MG tablet Take 5 mg by mouth Daily.    fish oil-omega-3 fatty acids 1000 MG capsule Take 1 g by mouth daily. 1200 mg daily    lansoprazole (PREVACID) 30 MG capsule TAKE ONE CAPSULE BY MOUTH TWICE A DAY Qty: 180 capsule, Refills: 1    levocetirizine (XYZAL) 5 MG tablet Take 5 mg by mouth  every evening.      metoprolol (TOPROL-XL) 50 MG 24 hr tablet Take 50 mg by mouth daily.     montelukast (SINGULAIR) 10 MG tablet Take 10 mg by mouth at bedtime.      rosuvastatin (CRESTOR) 10 MG tablet Take 5 mg by mouth daily. Take 1/2 tablet daily    tamsulosin (FLOMAX) 0.4 MG CAPS capsule Take 0.4 mg by mouth daily after supper.     zolpidem (AMBIEN) 10 MG tablet Take 10 mg by mouth at bedtime.      STOP taking these medications     aspirin 81 MG tablet      ibuprofen (ADVIL,MOTRIN) 800 MG tablet         Allergies  Allergen Reactions  . Floxin [Ofloxacin]     Lowers BP   Follow-up Information    Follow up with Marton Redwood, MD In 2 weeks.   Specialty:  Internal Medicine   Contact information:   [place] [address] [phone]       Follow up with Melvenia Beam, MD In 1 month.   Specialty:  Neurology   Contact information:   [place] [address] [phone]        The results of significant diagnostics from this hospitalization (including imaging, microbiology, ancillary and laboratory) are listed below for reference.    Significant Diagnostic Studies: Ct Head Wo Contrast  03/05/2015  CLINICAL DATA:  Right hand numbness and weakness. EXAM: CT HEAD WITHOUT CONTRAST TECHNIQUE: Contiguous axial images were obtained from the base of the skull through the vertex without intravenous contrast. COMPARISON:  None. FINDINGS: No acute intracranial abnormality. Specifically, no hemorrhage, hydrocephalus, mass lesion, acute infarction, or significant intracranial injury. No acute calvarial abnormality. Visualized paranasal sinuses and mastoids clear. Orbital soft tissues unremarkable. IMPRESSION: No acute intracranial abnormality. Electronically Signed   By: Rolm Baptise M.D.   On: 03/05/2015 10:38   Mr [name] Wo Contrast  03/05/2015  CLINICAL DATA:  70 year old hypertensive male with hyperlipidemia presenting with headache with associated right upper extremity weakness. Difficulty using right hand. Similar symptoms 2 months ago which resolved. Symptoms have waxed and waned. White noticed a facial drooping. Subsequent encounter. EXAM: MRI HEAD WITHOUT CONTRAST MRA HEAD WITHOUT CONTRAST TECHNIQUE: Multiplanar, multiecho pulse sequences of the brain and surrounding structures were obtained without intravenous contrast. Angiographic images of the head were obtained using MRA technique without contrast. COMPARISON:  03/05/2015 head CT.  No comparison  brain MR. FINDINGS: MRI HEAD FINDINGS Tiny acute nonhemorrhagic infarcts left motor strip and left postcentral gyrus. Minimal blood breakdown products posterior right lenticular nucleus versus mineral deposition. Otherwise no evidence of intracranial hemorrhage. Moderate patchy and punctate white matter changes most notable periventricular region consistent with result of chronic microvascular changes. Mild global atrophy without hydrocephalus. No intracranial mass lesion noted on this unenhanced exam. Mild exophthalmos. Degenerative changes occiput-C1 junction. Cervical medullary junction unremarkable. Partially empty sella incidentally noted. Minimal paranasal sinus mucosal thickening. MRA HEAD FINDINGS Anterior circulation without medium or large size vessel significant stenosis or occlusion. Mild middle cerebral artery branch vessel irregularity bilaterally. Ectatic right internal carotid artery cervical segment. Slight irregularity distal vertebral arteries without significant narrowing. No significant narrowing of the basilar artery. Nonvisualized anterior inferior cerebellar arteries with large posterior inferior cerebellar arteries bilaterally. Mild irregularity superior cerebellar arteries. Mild irregularity distal left posterior cerebral artery branch. No aneurysm noted. IMPRESSION: MRI HEAD Tiny acute nonhemorrhagic infarcts left motor strip and left postcentral  gyrus. Moderate patchy and punctate white matter changes most notable periventricular region consistent with result of chronic microvascular changes. Mild global atrophy without hydrocephalus. Mild exophthalmos. Degenerative changes occiput-C1 junction. MRA HEAD Anterior circulation without medium or large size vessel significant stenosis or occlusion. Mild middle cerebral artery branch vessel irregularity bilaterally. Slight irregularity distal vertebral arteries without significant narrowing. Nonvisualized anterior inferior cerebellar arteries  with large posterior inferior cerebellar arteries bilaterally. Mild irregularity superior cerebellar arteries. Electronically Signed   By: Genia Del M.D.   On: 03/05/2015 17:48   Mr Brain Wo Contrast  03/05/2015  CLINICAL DATA:  70 year old hypertensive male with hyperlipidemia presenting with headache with associated right upper extremity weakness. Difficulty using right hand. Similar symptoms 2 months ago which resolved. Symptoms have waxed and waned. White noticed a facial drooping. Subsequent encounter. EXAM: MRI HEAD WITHOUT CONTRAST MRA HEAD WITHOUT CONTRAST TECHNIQUE: Multiplanar, multiecho pulse sequences of the brain and surrounding structures were obtained without intravenous contrast. Angiographic images of the head were obtained using MRA technique without contrast. COMPARISON:  03/05/2015 head CT.  No comparison brain MR. FINDINGS: MRI HEAD FINDINGS Tiny acute nonhemorrhagic infarcts left motor strip and left postcentral gyrus. Minimal blood breakdown products posterior right lenticular nucleus versus mineral deposition. Otherwise no evidence of intracranial hemorrhage. Moderate patchy and punctate white matter changes most notable periventricular region consistent with result of chronic microvascular changes. Mild global atrophy without hydrocephalus. No intracranial mass lesion noted on this unenhanced exam. Mild exophthalmos. Degenerative changes occiput-C1 junction. Cervical medullary junction unremarkable. Partially empty sella incidentally noted. Minimal paranasal sinus mucosal thickening. MRA HEAD FINDINGS Anterior circulation without medium or large size vessel significant stenosis or occlusion. Mild middle cerebral artery branch vessel irregularity bilaterally. Ectatic right internal carotid artery cervical segment. Slight irregularity distal vertebral arteries without significant narrowing. No significant narrowing of the basilar artery. Nonvisualized anterior inferior cerebellar  arteries with large posterior inferior cerebellar arteries bilaterally. Mild irregularity superior cerebellar arteries. Mild irregularity distal left posterior cerebral artery branch. No aneurysm noted. IMPRESSION: MRI HEAD Tiny acute nonhemorrhagic infarcts left motor strip and left postcentral gyrus. Moderate patchy and punctate white matter changes most notable periventricular region consistent with result of chronic microvascular changes. Mild global atrophy without hydrocephalus. Mild exophthalmos. Degenerative changes occiput-C1 junction. MRA HEAD Anterior circulation without medium or large size vessel significant stenosis or occlusion. Mild middle cerebral artery branch vessel irregularity bilaterally. Slight irregularity distal vertebral arteries without significant narrowing. Nonvisualized anterior inferior cerebellar arteries with large posterior inferior cerebellar arteries bilaterally. Mild irregularity superior cerebellar arteries. Electronically Signed   By: Genia Del M.D.   On: 03/05/2015 17:48    Microbiology: No results found for this or any previous visit (from the past 240 hour(s)).   Labs: Basic Metabolic Panel:  Recent Labs Lab 03/05/15 1027 03/05/15 1033  NA 140 141  K 4.2 4.1  CL 108 104  CO2 23  --   GLUCOSE 124* 121*  BUN 12 14  CREATININE 0.95 0.90  CALCIUM 9.7  --    Liver Function Tests:  Recent Labs Lab 03/05/15 1027  AST 22  ALT 22  ALKPHOS 65  BILITOT 1.0  PROT 6.8  ALBUMIN 4.2   No results for input(s): LIPASE, AMYLASE in the last 168 hours. No results for input(s): AMMONIA in the last 168 hours. CBC:  Recent Labs Lab 03/05/15 1027 03/05/15 1033  WBC 7.9  --   NEUTROABS 4.5  --   HGB 15.1 16.3  HCT 45.6 48.0  MCV 94.0  --   PLT 183  --     CBG:  Recent Labs Lab 03/05/15 1047  GLUCAP 115*    Signed:  Eleonore Chiquito S MD.  [facility] 03/07/2015, 2:29 PM

## 2015-03-07 NOTE — Progress Notes (Signed)
STROKE TEAM PROGRESS NOTE   HISTORY OF PRESENT ILLNESS Joshua [name] is an 70 y.o. male who presents with acute onset of headache on waking this AM, followed by acute onset right hand weakness at 9:00 AM. Weakness waxed and waned in right hand following initial symptoms. Per wife, he may have had some right facial drooping as well. No vision or speech changes.   LSN: 9:00 AM tPA Given: Not administered due to low NIHSS of 1 and rapid improvement. Risks of IV tPA outweigh benefits.    SUBJECTIVE (INTERVAL HISTORY) wife is at bedside. Patient feels the symptoms are improved. He is ambulating with physical therapy and feels much better. Feels his right-sided hand weakness has significantly improved.   Discussion with patient and wife at the bedside regarding his strokes which appear to be embolic from an unknown source. Echo and carotid Dopplers have been unrevealing. Recommend outpatient transesophageal echocardiogram and possible loop recorder. If atrial fibrillation is found do recommend anticoagulation. The patient is aware of atrial fibrillation and the risks of stroke because his twin Brother has atrial fibrillation and is on anticoagulation.    OBJECTIVE Temp:  [97.9 F (36.6 C)-98.4 F (36.9 C)] 98.3 F (36.8 C) (02/23 0553) Pulse Rate:  [50-70] 63 (02/23 0553) Cardiac Rhythm:  [-] Normal sinus rhythm (02/23 0700) Resp:  [16-18] 18 (02/23 0553) BP: (101-118)/(67-78) 118/70 mmHg (02/23 0553) SpO2:  [96 %-100 %] 96 % (02/23 0553) Weight:  [99.8 kg (220 lb 0.3 oz)] 99.8 kg (220 lb 0.3 oz) (02/23 0200)  CBC:   Recent Labs Lab 03/05/15 1027 03/05/15 1033  WBC 7.9  --   NEUTROABS 4.5  --   HGB 15.1 16.3  HCT 45.6 48.0  MCV 94.0  --   PLT 183  --     Basic Metabolic Panel:   Recent Labs Lab 03/05/15 1027 03/05/15 1033  NA 140 141  K 4.2 4.1  CL 108 104  CO2 23  --   GLUCOSE 124* 121*  BUN 12 14  CREATININE 0.95 0.90  CALCIUM 9.7  --     Lipid Panel:      Component Value Date/Time   CHOL 137 03/06/2015 0550   TRIG 105 03/06/2015 0550   HDL 41 03/06/2015 0550   CHOLHDL 3.3 03/06/2015 0550   VLDL 21 03/06/2015 0550   LDLCALC 75 03/06/2015 0550   HgbA1c:  Lab Results  Component Value Date   HGBA1C 5.7* 03/06/2015   Urine Drug Screen: No results found for: LABOPIA, COCAINSCRNUR, LABBENZ, AMPHETMU, THCU, LABBARB    IMAGING  Ct Head Wo Contrast 03/05/2015   No acute intracranial abnormality.   Mr Joshua [name] Head Wo Contrast 03/05/2015    MRI HEAD  Tiny acute nonhemorrhagic infarcts left motor strip and left postcentral gyrus. Moderate patchy and punctate white matter changes most notable periventricular region consistent with result of chronic microvascular changes. Mild global atrophy without hydrocephalus. Mild exophthalmos. Degenerative changes occiput-C1 junction.  MRA HEAD  Anterior circulation without medium or large size vessel significant stenosis or occlusion. Mild middle cerebral artery branch vessel irregularity bilaterally. Slight irregularity distal vertebral arteries without significant narrowing. Nonvisualized anterior inferior cerebellar arteries with large posterior inferior cerebellar arteries bilaterally. Mild irregularity superior cerebellar arteries.     PHYSICAL EXAM  PHYSICAL EXAM Physical exam: Exam: Gen: NAD Eyes: anicteric sclerae, moist conjunctivae                    CV: no MRG, no carotid bruits, no  peripheral edema Mental Status: Alert, follows commands, good historian, oriented to person, date, place  Neuro: Detailed Neurologic Exam  Speech:    No aphasia, no dysarthria  Cranial Nerves:    The pupils are equal, round, and reactive to light.. Attempted, Fundi not visualized.  EOMI. No gaze preference. Facial sensation intact and the muscles of mastication are normal. Visual fields full. Face symmetric, Tongue midline. Hearing intact to voice. Shoulder shrug intact  Motor Observation:    no  involuntary movements noted. Tone appears normal.     Strength:    5/5 including bilateral hand grip     Sensation:  Intact to LT and pin prick  Plantars downgoing.   ASSESSMENT/PLAN [name] is a 70 y.o. male with history of dyslipidemia, bundle-branch block, carotid artery disease, asthma, and hypertension presenting with headache and right hand weakness. He did not receive IV t-PA due to minimal deficits.  Strokes:  Dominant infarcts possibly embolic from an unknown etiology.  Resultant  Resolving deficits  MRI - Tiny acute nonhemorrhagic infarcts left motor strip and left postcentral gyrus.  MRA  - anterior circulation without medium or large size vessel significant stenosis or occlusion.  Carotid Doppler - There is no obvious evidence of hemodynamically significant internal carotid artery stenosis bilaterally. The right vertebral artery is patent with antegrade flow. Unable to visualize the left vertebral artery.  2D Echo - EF 50-55%. No cardiac source of emboli identified.  Needs TEE and possible loop outpatient. If AFib  Needs anticoagulation  LDL - 75  HgbA1c- 5.7  VTE prophylaxis - Lovenox Diet Heart Room service appropriate?: Yes; Fluid consistency:: Thin  aspirin 81 mg daily prior to admission, now on aspirin 325 mg daily Change to Plavix  Patient counseled to be compliant with his antithrombotic medications  Ongoing aggressive stroke risk factor management  Therapy recommendations: No follow-up physical or speech therapy recommended  Disposition:  Pending  Hypertension  Blood pressure mildly low at times. (Toprol-XL 50 mg daily)  Permissive hypertension (OK if < 220/120) but gradually normalize in 5-7 days  Hyperlipidemia  Home meds:  Crestor 5 mg daily   LDL 75, goal < 70  Continue statin at discharge    Other Stroke Risk Factors  Advanced age  Obesity, Body mass index is 29.83 kg/(m^2).     Other Active Problems  History of  carotid artery disease - await carotid Dopplers  Hospital day # 1  Joshua Bussing PA-C [facility] Pager [phone] 03/07/2015, 9:38 AM    Personally examined patient and images, and have participated in and made any corrections needed to history, physical, neuro exam,assessment and plan as stated above.  I have personally obtained the history, evaluated lab date, reviewed imaging studies and agree with radiology interpretations.  Patient presented with headache and right hand weakness. Patient's symptoms are improving. MRI of the brain shows Tiny acute nonhemorrhagic infarcts left motor strip and left postcentral gyrus. Pending full workup above.  Discussion with patient and wife at the bedside regarding his strokes which appear to be embolic from an unknown source. Echo and carotid Dopplers have been unrevealing. Recommend outpatient transesophageal echocardiogram and possible loop recorder. If atrial fibrillation is found do recommend anticoagulation. The patient is aware of atrial fibrillation and the risks of stroke because his twin Brother has atrial fibrillation and is on anticoagulation.   Sarina Ill, MD Stroke Neurology [phone] [facility]   To contact Stroke Continuity provider, please refer to [email or web address] After  hours, contact General Neurology

## 2015-03-07 NOTE — Progress Notes (Signed)
  Echocardiogram 2D Echocardiogram has been performed.  Joshua [name] 03/07/2015, 9:51 AM

## 2015-03-20 ENCOUNTER — Encounter: Payer: Self-pay | Admitting: Cardiology

## 2015-03-20 ENCOUNTER — Telehealth: Payer: Self-pay

## 2015-03-20 ENCOUNTER — Ambulatory Visit (INDEPENDENT_AMBULATORY_CARE_PROVIDER_SITE_OTHER): Payer: Medicare HMO | Admitting: Cardiology

## 2015-03-20 VITALS — BP 130/90 | HR 67 | Ht 73.0 in | Wt 216.2 lb

## 2015-03-20 DIAGNOSIS — I639 Cerebral infarction, unspecified: Secondary | ICD-10-CM | POA: Diagnosis not present

## 2015-03-20 NOTE — H&P (Signed)
03/20/2015 Joshua [name]   [birth year]  [id number]  Primary Physician Marton Redwood, MD Primary Cardiologist: Dr. Ron Parker  Reason for Visit/CC: [facility] F/u for CVA  HPI:  The patient is a 70 y/o male, previously followed by Dr. Ron Parker. He has been seen in the past for chest pain. There is no documented coronary disease. In 2014 we did do a nuclear scan. There was no significant abnormality. Wall motion is normal.   He was recently admitted to [facility] from 03/05/15-03/07/15 for CVA. Brain MRI showed tiny acute non-hemorrhagic infarct left motor strip and left postcentral gyrus. No source was identified. TTE showed normal LVF with EF of 50-55%. No cardiac source of embolism was identified. Wall motion was normal. Carotid dopplers were unremarkable. No afib was captured on telemetry. He was placed on Plavix.   He now presents to clinic for post hospital f/u. He denies any recurrent neurological symptoms. He denies any symptoms of atrial fibrillation. Physical exam reveals RRR. BP is well controlled.     Current Outpatient Prescriptions  Medication Sig Dispense Refill  . albuterol (PROVENTIL HFA;VENTOLIN HFA) 108 (90 BASE) MCG/ACT inhaler Inhale 2 puffs into the lungs as needed for wheezing.    . clopidogrel (PLAVIX) 75 MG tablet Take 1 tablet (75 mg total) by mouth daily. 30 tablet 2  . Cyanocobalamin (RA VITAMIN B-12 TR) 1000 MCG TBCR Take 1,000 mg by mouth daily.    [name] Calcium (STOOL SOFTENER PO) Take by mouth 2 (two) times daily.    . DULoxetine (CYMBALTA) 60 MG capsule Take 60 mg by mouth daily.    [name] DYMISTA 137-50 MCG/ACT SUSP Place 1 puff into both nostrils at bedtime. One spray each nostril twice a day.    . finasteride (PROSCAR) 5 MG tablet Take 5 mg by mouth Daily.    . fish oil-omega-3 fatty acids 1000 MG capsule Take 1 g by mouth daily. 1200 mg daily    . lansoprazole (PREVACID) 30 MG capsule TAKE ONE CAPSULE BY MOUTH TWICE A DAY 180 capsule 1  . levocetirizine (XYZAL) 5 MG  tablet Take 5 mg by mouth every evening.      . metoprolol succinate (TOPROL-XL) 100 MG 24 hr tablet Take 50 mg by mouth daily.  3  . montelukast (SINGULAIR) 10 MG tablet Take 10 mg by mouth at bedtime.      . rosuvastatin (CRESTOR) 10 MG tablet Take 5 mg by mouth daily. Take 1/2 tablet daily    . tamsulosin (FLOMAX) 0.4 MG CAPS capsule Take 0.4 mg by mouth daily after supper.     . traMADol (ULTRAM) 50 MG tablet Take 50 mg by mouth every 8 (eight) hours as needed. for pain  1  . zolpidem (AMBIEN) 10 MG tablet Take 10 mg by mouth at bedtime.     No current facility-administered medications for this visit.    Allergies  Allergen Reactions  . Floxin [Ofloxacin] Other (See Comments)    Lowers BP    Social History   Social History  . Marital Status: Married    Spouse Name: N/A  . Number of Children: 2  . Years of Education: N/A   Occupational History  . retired    Social History Main Topics  . Smoking status: Never Smoker   . Smokeless tobacco: Never Used  . Alcohol Use: No  . Drug Use: No  . Sexual Activity: Not on file   Other Topics Concern  . Not on file   Social History  Narrative     Review of Systems: General: negative for chills, fever, night sweats or weight changes.  Cardiovascular: negative for chest pain, dyspnea on exertion, edema, orthopnea, palpitations, paroxysmal nocturnal dyspnea or shortness of breath Dermatological: negative for rash Respiratory: negative for cough or wheezing Urologic: negative for hematuria Abdominal: negative for nausea, vomiting, diarrhea, bright red blood per rectum, melena, or hematemesis Neurologic: negative for visual changes, syncope, or dizziness All other systems reviewed and are otherwise negative except as noted above.    Blood pressure 130/90, pulse 67, height 6\' 1"  (1.854 m), weight 216 lb 3.2 oz (98.068 kg), SpO2 97 %.  General appearance: alert, cooperative and no distress Neck: no carotid bruit and no JVD Lungs:  clear to auscultation bilaterally Heart: regular rate and rhythm, S1, S2 normal, no murmur, click, rub or gallop Extremities: no LEE Pulses: 2+ and symmetric Skin: warm and dry Neurologic: Grossly normal   ASSESSMENT AND PLAN:   1. CVA: MRI 03/05/15 showed tiny acute non-hemorrhagic infarct left motor strip and left postcentral gyrus.TTE showed normal LVF with EF of 50-55%. No cardiac source of embolism was identified. Wall motion was normal. Carotid dopplers were unremarkable. No afib was captured on telemetry. Neurology referred to [place] for consideration for TEE to r/o LA thrombus and implantable loop recorder to r/o PAF, if TEE is unrevealing. We dicussed potential risk of both procedures, including risk of perforation, bleeding, infection and reaction to anestesia. He understands these risk and purpose of each procedure and wishes to proceed. We will contact EP to see if any official EP consult is needed.    [name] PA-C 03/20/2015 11:42 AM

## 2015-03-20 NOTE — Telephone Encounter (Signed)
Called with pt to let him that that he Tee and loop recorder have been scheduled for 04/04/15. Tee @ 8am with Dr.Crenshaw. Pt is report to [facility] @ 6:30-7am Loop Recorder Implantation @ 11:30am with Dr.Camnitz  Pt given verbal pre-procedure instructions.  Nothing to eat or drink after midnight, ok to take morning medications with just enough water to get them down.  Pt rqst that instructions also be sent through my chart. Done  Pt verbalized understanding to all instruction given, and voiced appreciation for the call

## 2015-03-20 NOTE — Patient Instructions (Signed)
Medication Instructions:  Your physician recommends that you continue on your current medications as directed. Please refer to the Current Medication list given to you today.   Labwork: None ordered  Testing/Procedures: Your physician has requested that you have a TEE. During a TEE, sound waves are used to create images of your heart. It provides your doctor with information about the size and shape of your heart and how well your heart's chambers and valves are working. In this test, a transducer is attached to the end of a flexible tube that's guided down your throat and into your esophagus (the tube leading from you mouth to your stomach) to get a more detailed image of your heart. You are not awake for the procedure. Please see the instruction sheet given to you today. For further information please visit [email or web address].  Your physician has recommended you have a loop recorder implanted  We will call you to schedule your test  Follow-Up: Your physician recommends that you schedule a follow-up appointment as needed   Any Other Special Instructions Will Be Listed Below (If Applicable).     If you need a refill on your cardiac medications before your next appointment, please call your pharmacy.

## 2015-04-04 ENCOUNTER — Encounter: Payer: Self-pay | Admitting: Neurology

## 2015-04-04 ENCOUNTER — Ambulatory Visit (HOSPITAL_COMMUNITY)
Admission: RE | Admit: 2015-04-04 | Discharge: 2015-04-04 | Disposition: A | Discharge status: Home / Self Care | Payer: Medicare HMO | Source: Ambulatory Visit | Attending: Cardiology | Admitting: Cardiology

## 2015-04-04 ENCOUNTER — Encounter (HOSPITAL_COMMUNITY)
Admission: RE | Disposition: A | Discharge status: Home / Self Care | Payer: Self-pay | Source: Ambulatory Visit | Attending: Cardiology

## 2015-04-04 ENCOUNTER — Ambulatory Visit (INDEPENDENT_AMBULATORY_CARE_PROVIDER_SITE_OTHER): Payer: Medicare HMO | Admitting: Neurology

## 2015-04-04 ENCOUNTER — Ambulatory Visit (HOSPITAL_BASED_OUTPATIENT_CLINIC_OR_DEPARTMENT_OTHER): Payer: Medicare HMO

## 2015-04-04 ENCOUNTER — Encounter (HOSPITAL_COMMUNITY): Payer: Self-pay | Admitting: *Deleted

## 2015-04-04 VITALS — BP 115/64 | HR 45 | Ht 73.0 in | Wt 222.6 lb

## 2015-04-04 DIAGNOSIS — Z789 Other specified health status: Secondary | ICD-10-CM | POA: Diagnosis not present

## 2015-04-04 DIAGNOSIS — I63512 Cerebral infarction due to unspecified occlusion or stenosis of left middle cerebral artery: Secondary | ICD-10-CM | POA: Diagnosis not present

## 2015-04-04 DIAGNOSIS — I340 Nonrheumatic mitral (valve) insufficiency: Secondary | ICD-10-CM

## 2015-04-04 DIAGNOSIS — R29898 Other symptoms and signs involving the musculoskeletal system: Secondary | ICD-10-CM

## 2015-04-04 DIAGNOSIS — I639 Cerebral infarction, unspecified: Secondary | ICD-10-CM

## 2015-04-04 DIAGNOSIS — R5383 Other fatigue: Secondary | ICD-10-CM | POA: Diagnosis not present

## 2015-04-04 DIAGNOSIS — I63412 Cerebral infarction due to embolism of left middle cerebral artery: Secondary | ICD-10-CM | POA: Diagnosis not present

## 2015-04-04 DIAGNOSIS — I638 Other cerebral infarction: Secondary | ICD-10-CM | POA: Insufficient documentation

## 2015-04-04 DIAGNOSIS — Q211 Atrial septal defect: Secondary | ICD-10-CM | POA: Diagnosis not present

## 2015-04-04 DIAGNOSIS — R29818 Other symptoms and signs involving the nervous system: Secondary | ICD-10-CM

## 2015-04-04 HISTORY — PX: EP IMPLANTABLE DEVICE: SHX172B

## 2015-04-04 HISTORY — PX: TEE WITHOUT CARDIOVERSION: SHX5443

## 2015-04-04 SURGERY — LOOP RECORDER INSERTION

## 2015-04-04 SURGERY — ECHOCARDIOGRAM, TRANSESOPHAGEAL
Anesthesia: Moderate Sedation

## 2015-04-04 MED ORDER — DIPHENHYDRAMINE HCL 50 MG/ML IJ SOLN
INTRAMUSCULAR | Status: AC
  Filled 2015-04-04: qty 1

## 2015-04-04 MED ORDER — FENTANYL CITRATE (PF) 100 MCG/2ML IJ SOLN
INTRAMUSCULAR | Status: AC
  Filled 2015-04-04: qty 2

## 2015-04-04 MED ORDER — LIDOCAINE-EPINEPHRINE 1 %-1:100000 IJ SOLN
INTRAMUSCULAR | Status: AC
  Filled 2015-04-04: qty 3

## 2015-04-04 MED ORDER — LIDOCAINE-EPINEPHRINE 1 %-1:100000 IJ SOLN
INTRAMUSCULAR | Status: DC | PRN
  Administered 2015-04-04: 20 mL
  Administered 2015-04-04: 10 mL
  Administered 2015-04-04: 20 mL

## 2015-04-04 MED ORDER — FENTANYL CITRATE (PF) 100 MCG/2ML IJ SOLN
INTRAMUSCULAR | Status: DC | PRN
  Administered 2015-04-04: 25 ug via INTRAVENOUS

## 2015-04-04 MED ORDER — BUTAMBEN-TETRACAINE-BENZOCAINE 2-2-14 % EX AERO
INHALATION_SPRAY | CUTANEOUS | Status: DC | PRN
  Administered 2015-04-04: 2 via TOPICAL

## 2015-04-04 MED ORDER — SODIUM CHLORIDE 0.9 % IV SOLN: INTRAVENOUS | Status: DC

## 2015-04-04 MED ORDER — MIDAZOLAM HCL 5 MG/ML IJ SOLN
INTRAMUSCULAR | Status: AC
  Filled 2015-04-04: qty 2

## 2015-04-04 MED ORDER — MIDAZOLAM HCL 10 MG/2ML IJ SOLN
INTRAMUSCULAR | Status: DC | PRN
  Administered 2015-04-04 (×2): 2 mg via INTRAVENOUS

## 2015-04-04 SURGICAL SUPPLY — 2 items
LOOP REVEAL LINQSYS (Prosthesis & Implant Heart) ×2 IMPLANT
PACK LOOP INSERTION (CUSTOM PROCEDURE TRAY) ×2 IMPLANT

## 2015-04-04 NOTE — Interval H&P Note (Signed)
History and Physical Interval Note:  04/04/2015 7:48 AM  Joshua [name]  has presented today for surgery, with the diagnosis of CVA  The various methods of treatment have been discussed with the patient and family. After consideration of risks, benefits and other options for treatment, the patient has consented to  Procedure(s): TRANSESOPHAGEAL ECHOCARDIOGRAM (TEE) (N/A) as a surgical intervention .  The patient's history has been reviewed, patient examined, no change in status, stable for surgery.  I have reviewed the patient's chart and labs.  Questions were answered to the patient's satisfaction.     Kirk Ruths

## 2015-04-04 NOTE — Progress Notes (Signed)
[facility]    Provider:  Dr Jaynee Eagles Referring Provider: Marton Redwood, MD Primary Care Physician:  Marton Redwood, MD  CC:  Left MCA stroke  HPI:  Maddox Marchesano is a 70 y.o. male here as a referral from Dr. Brigitte Pulse for embolic stroke. Past medical history of vertigo, right bundle branch block, tremor, impaired fasting glucose, asthma, anxiety, hyperlipidemia, hypertension, headache. Patient was admitted to [facility] on 03/05/2015 with acute onset of headache after waking up followed by acute onset right hand weakness at 9 AM. He also experienced facial drooping. No vision or speech changes. NIH stroke scale was 1 and rapidly improving and IV tPA was not administered. Patient returns today for follow-up. Still having some difficulty with fine motor movements of the right hand. He is having shortness of breath and is following with cardiology with recent TEE and loop recorder placement. He is very winded and can't catch his breath. The episodes are discrete, by the time he gets up the steps he can be struggling for air. Always when he first starts to walk. But symptoms also can be after he is walking for an extended period of time. No new stroke symptoms, no new weakness, denies dysarthria, dysphagia, aphasia. Reviewed images of the brain with patient and his wife again today. Answered questions about his stroke.Discussion with patient and wife at regarding his strokes which appear to be embolic from an unknown source. Echo and carotid Dopplers have been unrevealing. Recommend outpatient transesophageal echocardiogram and possible loop recorder. If atrial fibrillation is found do recommend anticoagulation. The patient is aware of atrial fibrillation and the risks of stroke because his twin Brother has atrial fibrillation and is on anticoagulation.  Reviewed notes, labs and imaging from outside physicians, which showed:  Personally reviewed images and agree with the following:  Ct  Head Wo Contrast 03/05/2015  No acute intracranial abnormality.   Mr [name] Head Wo Contrast 03/05/2015   MRI HEAD  Tiny acute nonhemorrhagic infarcts left motor strip and left postcentral gyrus. Moderate patchy and punctate white matter changes most notable periventricular region consistent with result of chronic microvascular changes. Mild global atrophy without hydrocephalus. Mild exophthalmos. Degenerative changes occiput-C1 junction.  MRA HEAD  Anterior circulation without medium or large size vessel significant stenosis or occlusion. Mild middle cerebral artery branch vessel irregularity bilaterally. Slight irregularity distal vertebral arteries without significant narrowing. Nonvisualized anterior inferior cerebellar arteries with large posterior inferior cerebellar arteries bilaterally. Mild irregularity superior cerebellar arteries.    Review of Systems: Patient complains of symptoms per HPI as well as the following symptoms: Fatigue, ringing in ears, runny nose, shortness of breath, snoring, joint pain, back pain, walking difficulty. Pertinent negatives per HPI. All others negative.   Social History   Social History  . Marital Status: Married    Spouse Name: [name]   . Number of Children: 2  . Years of Education: 15   Occupational History  . Retired    Social History Main Topics  . Smoking status: Never Smoker   . Smokeless tobacco: Never Used  . Alcohol Use: No  . Drug Use: No  . Sexual Activity: Not on file   Other Topics Concern  . Not on file   Social History Narrative   Lives with wife.    Caffeine use: Coffee/tea/soda- ocass    Family History  Problem Relation Age of Onset  . Heart attack Father   . Heart disease Father   . Clotting disorder Father   .  Heart disease Brother   . Hypertension Mother   . Breast cancer Paternal Grandmother   . Cancer Paternal Grandmother     breast  . Heart attack Paternal Grandfather   . Colon cancer Neg Hx   .  Esophageal cancer Neg Hx   . Stomach cancer Neg Hx   . Rectal cancer Neg Hx     Past Medical History  Diagnosis Date  . Vertigo   . Sleep apnea     pt denies  . Palpitations     Benign PVCs  . Dyslipidemia   . GERD (gastroesophageal reflux disease)   . Chest pain     Coronaries normal 1996 /  nuclear..06/2002..normal...EF  56% /  stress echo.. May, 2011.... no  scar or ischemia... rate related RBBB  . RBBB (right bundle branch block)     rate related  . Hx of colonoscopy   . Ejection fraction     EF 60%, stress echo, May, 2011  . Carotid artery disease ([place])     Carotid Doppler normal August, 2007  . Tremor     Hand tremor  . Diverticulosis   . Shingles   . IFG (impaired fasting glucose)   . Allergic rhinitis   . Hx of colonic polyps     adenomatous  . Asthma   . Anal fissure   . Arthritis   . Allergy   . Anxiety     from chronic pain from surgery- on Cymbalta  . Cataract   . Hyperlipidemia   . HTN (hypertension)     takes Metoprolol for PVC control  . TIA (transient ischemic attack) 02/2015  . Headache     Past Surgical History  Procedure Laterality Date  . Back surgery  2002,2009    x 6  . Neck surgery  2002  . Hernia repair      laprascopic  . Rotator cuff repair Left   . Cholecystectomy  11/30/2012    with IOC  . Upper gastrointestinal endoscopy    . Colonoscopy      Current Outpatient Prescriptions  Medication Sig Dispense Refill  . albuterol (PROVENTIL HFA;VENTOLIN HFA) 108 (90 BASE) MCG/ACT inhaler Inhale 2 puffs into the lungs as needed for wheezing.    . clopidogrel (PLAVIX) 75 MG tablet Take 1 tablet (75 mg total) by mouth daily. 30 tablet 2  . Cyanocobalamin (RA VITAMIN B-12 TR) 1000 MCG TBCR Take 1,000 mg by mouth daily.    [name] Calcium (STOOL SOFTENER PO) Take 1 capsule by mouth 2 (two) times daily as needed (for constipation).     . DULoxetine (CYMBALTA) 60 MG capsule Take 60 mg by mouth daily.    [name] DYMISTA 137-50 MCG/ACT SUSP Place  1 puff into both nostrils 2 (two) times daily.     . finasteride (PROSCAR) 5 MG tablet Take 5 mg by mouth Daily.    . fish oil-omega-3 fatty acids 1000 MG capsule Take 1 g by mouth daily. 1200 mg daily    . Fluticasone-Salmeterol (ADVAIR) 250-50 MCG/DOSE AEPB Inhale 1 puff into the lungs 2 (two) times daily.    . lansoprazole (PREVACID) 30 MG capsule TAKE ONE CAPSULE BY MOUTH TWICE A DAY (Patient taking differently: TAKE 30 MG BY MOUTH TWICE A DAY) 180 capsule 1  . levocetirizine (XYZAL) 5 MG tablet Take 5 mg by mouth every evening.      . metoprolol succinate (TOPROL-XL) 100 MG 24 hr tablet Take 50 mg by mouth daily.  3  .  montelukast (SINGULAIR) 10 MG tablet Take 10 mg by mouth at bedtime.      [name] OVER THE COUNTER MEDICATION Place 1 drop into both eyes every morning. "[place]o" Eye Drop    . rosuvastatin (CRESTOR) 10 MG tablet Take 5 mg by mouth daily. Take 1/2 tablet daily    . tamsulosin (FLOMAX) 0.4 MG CAPS capsule Take 0.4 mg by mouth daily after supper.     . traMADol (ULTRAM) 50 MG tablet Take 50 mg by mouth every 8 (eight) hours as needed. for pain  1  . zolpidem (AMBIEN) 10 MG tablet Take 10 mg by mouth at bedtime.     No current facility-administered medications for this visit.    Allergies as of 04/04/2015 - Review Complete 04/04/2015  Allergen Reaction Noted  . Floxin [ofloxacin] Other (See Comments) 05/21/2010    Vitals: There were no vitals taken for this visit. Last Weight:  Wt Readings from Last 1 Encounters:  04/04/15 216 lb (97.977 kg)   Last Height:   Ht Readings from Last 1 Encounters:  04/04/15 6\' 1"  (1.854 m)     Gen: NAD Eyes: anicteric sclerae, moist conjunctivae  CV: no MRG, no carotid bruits, no peripheral edema Mental Status: Alert, follows commands, good historian, oriented to person, date, place  Neuro: Detailed Neurologic Exam  Speech:  No aphasia, no dysarthria  Cranial Nerves:  The pupils are equal, round, and reactive  to light.. Attempted, Fundi not visualized. EOMI. No gaze preference. Facial sensation intact and the muscles of mastication are normal. Visual fields full. Face symmetric, Tongue midline. Hearing intact to voice. Shoulder shrug intact  Motor Observation:  no involuntary movements noted. Tone appears normal. Decreased fine motor movements of the right hand.   Strength:  5/5 including bilateral hand grip   Sensation:  Intact to LT and pin prick  Plantars downgoing.   ASSESSMENT/PLAN Mr. Kohl Lauderman is a 70 y.o. male with Past medical history of vertigo, right bundle branch block, tremor, impaired fasting glucose, asthma, anxiety, hyperlipidemia, hypertension, headache. Patient was admitted to [facility] on 03/05/2015 with acute onset of headache after waking up followed by acute onset right hand weakness at 9 AM. He also experienced facial drooping. No vision or speech changes. NIH stroke scale was 1 He did not receive IV t-PA due to minimal deficits.  Strokes: Dominant infarcts possibly embolic from an unknown etiology.  Resultant decreased fine motor movements of the right hand. I recommended occupational therapy.  MRI - Tiny acute nonhemorrhagic infarcts left motor strip and left postcentral gyrus.  MRA - anterior circulation without medium or large size vessel significant stenosis or occlusion.  Carotid Doppler - There is no obvious evidence of hemodynamically significant internal carotid artery stenosis bilaterally. The right vertebral artery is patent with antegrade flow. Unable to visualize the left vertebral artery.  2D Echo - EF 50-55%. No cardiac source of emboli identified.  TEE and possible loop outpatient completed. If AFib then recommend anticoagulation.  LDL - 75 continue Crestor.  HgbA1c- 5.7 advised diet and exercise.  aspirin 81 mg daily prior to admission, Changed to Plavix  Patient counseled to be compliant with his antithrombotic  medications  Ongoing aggressive stroke risk factor management with primary care including management of blood pressure  Therapy recommendations: We'll order occupational therapy for right hand decreased fine motor movements    Personally examined patient and images, and have participated in and made any corrections needed to history, physical, neuro exam,assessment and plan as  stated above. I have personally obtained the history, evaluated lab date, reviewed imaging studies and agree with radiology interpretations. Patient presented with headache, right facial droop and right hand weakness. Patient's symptoms are improving. MRI of the brain showed Tiny acute nonhemorrhagic infarcts left motor strip and left postcentral gyrus.     Sarina Ill, MD  [facility] [address] [place], [place] [address]  Phone [phone] Fax [phone]  A total of 40 minutes was spent face-to-face with this patient. Over half this time was spent on counseling patient on the embolic left MCA stroke diagnosis and different diagnostic and therapeutic options available.

## 2015-04-04 NOTE — Discharge Instructions (Signed)
Cardiac Event Monitoring A cardiac event monitor is a small recording device used to help detect abnormal heart rhythms (arrhythmias). The monitor is used to record heart rhythm when noticeable symptoms such as the following occur:  Fast heartbeats (palpitations), such as heart racing or fluttering.  Dizziness.  Fainting or light-headedness.  Unexplained weakness. The monitor is wired to two electrodes placed on your chest. Electrodes are flat, sticky disks that attach to your skin. The monitor can be worn for up to 30 days. You will wear the monitor at all times, except when bathing.  HOW TO USE YOUR CARDIAC EVENT MONITOR A technician will prepare your chest for the electrode placement. The technician will show you how to place the electrodes, how to work the monitor, and how to replace the batteries. Take time to practice using the monitor before you leave the office. Make sure you understand how to send the information from the monitor to your health care provider. This requires a telephone with a landline, not a cell phone. You need to:  Wear your monitor at all times, except when you are in water:  Do not get the monitor wet.  Take the monitor off when bathing. Do not swim or use a hot tub with it on.  Keep your skin clean. Do not put body lotion or moisturizer on your chest.  Change the electrodes daily or any time they stop sticking to your skin. You might need to use tape to keep them on.  It is possible that your skin under the electrodes could become irritated. To keep this from happening, try to put the electrodes in slightly different places on your chest. However, they must remain in the area under your left breast and in the upper right section of your chest.  Make sure the monitor is safely clipped to your clothing or in a location close to your body that your health care provider recommends.  Press the button to record when you feel symptoms of heart trouble, such as  dizziness, weakness, light-headedness, palpitations, thumping, shortness of breath, unexplained weakness, or a fluttering or racing heart. The monitor is always on and records what happened slightly before you pressed the button, so do not worry about being too late to get good information.  Keep a diary of your activities, such as walking, doing chores, and taking medicine. It is especially important to note what you were doing when you pushed the button to record your symptoms. This will help your health care provider determine what might be contributing to your symptoms. The information stored in your monitor will be reviewed by your health care provider alongside your diary entries.  Send the recorded information as recommended by your health care provider. It is important to understand that it will take some time for your health care provider to process the results.  Change the batteries as recommended by your health care provider. SEEK IMMEDIATE MEDICAL CARE IF:   You have chest pain.  You have extreme difficulty breathing or shortness of breath.  You develop a very fast heartbeat that persists.  You develop dizziness that does not go away.  You faint or constantly feel you are about to faint.   This information is not intended to replace advice given to you by your health care provider. Make sure you discuss any questions you have with your health care provider.   Document Released: 10/08/2007 Document Revised: 01/19/2014 Document Reviewed: 06/27/2012 Elsevier Interactive Patient Education 2016 [employer]  Echocardiogram Transesophageal echocardiography (TEE) is a picture test of your heart using sound waves. The pictures taken can give very detailed pictures of your heart. This can help your doctor see if there are problems with your heart. TEE can check:  If your heart has blood clots in it.  How well your heart valves are working.  If you have an infection  on the inside of your heart.  Some of the major arteries of your heart.  If your heart valve is working after a Office manager.  Your heart before a procedure that uses a shock to your heart to get the rhythm back to normal. BEFORE THE PROCEDURE  Do not eat or drink for 6 hours before the procedure or as told by your doctor.  Make plans to have someone drive you home after the procedure. Do not drive yourself home.  An IV tube will be put in your arm. PROCEDURE  You will be given a medicine to help you relax (sedative). It will be given through the IV tube.  A numbing medicine will be sprayed or gargled in the back of your throat to help numb it.  The tip of the probe is placed into the back of your mouth. You will be asked to swallow. This helps to pass the probe into your esophagus.  Once the tip of the probe is in the right place, your doctor can take pictures of your heart.  You may feel pressure at the back of your throat. AFTER THE PROCEDURE  You will be taken to a recovery area so the sedative can wear off.  Your throat may be sore and scratchy. This will go away slowly over time.  You will go home when you are fully awake and able to swallow liquids.  You should have someone stay with you for the next 24 hours.  Do not drive or operate machinery for the next 24 hours.   This information is not intended to replace advice given to you by your health care provider. Make sure you discuss any questions you have with your health care provider.   Document Released: 10/26/2008 Document Revised: 01/03/2013 Document Reviewed: 06/30/2012 Elsevier Interactive Patient Education [employer].

## 2015-04-04 NOTE — Patient Instructions (Signed)
Remember to drink plenty of fluid, eat healthy meals and do not skip any meals. Try to eat protein with a every meal and eat a healthy snack such as fruit or nuts in between meals. Try to keep a regular sleep-wake schedule and try to exercise daily, particularly in the form of walking, 20-30 minutes a day, if you can.   As far as your medications are concerned, I would like to suggest: Continue current medications  Our phone number is [phone]. We also have an after hours call service for urgent matters and there is a physician on-call for urgent questions. For any emergencies you know to call 911 or go to the nearest emergency room

## 2015-04-04 NOTE — CV Procedure (Signed)
See full TEE report in [employer]; normal LV function; mild LAE; no LAA thrombus; positive PFO by color doppler; small oscillating density on aortic valve (probable lambl's excrescence); trace AI; mild MR. Kirk Ruths

## 2015-04-04 NOTE — H&P (Signed)
Joshua [name] seen and examined.  Has history of stroke with right arm numbness and weakness which has improved.  Regular rhythm, no murmurs, lungs clear.  Plan for TEE then LINQ if negative.  Risks explained and include bleeding and infection.  Patient understands and has agreed to [facility] if necessary pending TEE.  Will Curt Bears, MD 04/04/2015 7:33 AM

## 2015-04-05 ENCOUNTER — Encounter (HOSPITAL_COMMUNITY): Payer: Self-pay | Admitting: Cardiology

## 2015-04-05 DIAGNOSIS — E784 Other hyperlipidemia: Secondary | ICD-10-CM | POA: Diagnosis not present

## 2015-04-05 DIAGNOSIS — I10 Essential (primary) hypertension: Secondary | ICD-10-CM | POA: Diagnosis not present

## 2015-04-05 DIAGNOSIS — I493 Ventricular premature depolarization: Secondary | ICD-10-CM | POA: Diagnosis not present

## 2015-04-05 DIAGNOSIS — I69959 Hemiplegia and hemiparesis following unspecified cerebrovascular disease affecting unspecified side: Secondary | ICD-10-CM | POA: Diagnosis not present

## 2015-04-05 DIAGNOSIS — Z6828 Body mass index (BMI) 28.0-28.9, adult: Secondary | ICD-10-CM | POA: Diagnosis not present

## 2015-04-05 DIAGNOSIS — R7302 Impaired glucose tolerance (oral): Secondary | ICD-10-CM | POA: Diagnosis not present

## 2015-04-05 LAB — COMPREHENSIVE METABOLIC PANEL WITH GFR
ALT: 17 IU/L (ref 0–44)
AST: 21 IU/L (ref 0–40)
Albumin/Globulin Ratio: 2.3 — ABNORMAL HIGH (ref 1.2–2.2)
Albumin: 4.4 g/dL (ref 3.6–4.8)
Alkaline Phosphatase: 69 IU/L (ref 39–117)
BUN/Creatinine Ratio: 19 (ref 10–22)
BUN: 19 mg/dL (ref 8–27)
Bilirubin Total: 0.7 mg/dL (ref 0.0–1.2)
CO2: 21 mmol/L (ref 18–29)
Calcium: 9.2 mg/dL (ref 8.6–10.2)
Chloride: 103 mmol/L (ref 96–106)
Creatinine, Ser: 1.02 mg/dL (ref 0.76–1.27)
GFR calc Af Amer: 87 mL/min/1.73
GFR calc non Af Amer: 75 mL/min/1.73
Globulin, Total: 1.9 g/dL (ref 1.5–4.5)
Glucose: 115 mg/dL — ABNORMAL HIGH (ref 65–99)
Potassium: 5.4 mmol/L — ABNORMAL HIGH (ref 3.5–5.2)
Sodium: 143 mmol/L (ref 134–144)
Total Protein: 6.3 g/dL (ref 6.0–8.5)

## 2015-04-05 LAB — CBC
Hematocrit: 41.8 % (ref 37.5–51.0)
Hemoglobin: 14.1 g/dL (ref 12.6–17.7)
MCH: 30.9 pg (ref 26.6–33.0)
MCHC: 33.7 g/dL (ref 31.5–35.7)
MCV: 92 fL (ref 79–97)
Platelets: 188 10*3/uL (ref 150–379)
RBC: 4.57 x10E6/uL (ref 4.14–5.80)
RDW: 14.7 % (ref 12.3–15.4)
WBC: 7.9 10*3/uL (ref 3.4–10.8)

## 2015-04-05 LAB — THYROID PANEL WITH TSH
Free Thyroxine Index: 1.9 (ref 1.2–4.9)
T3 Uptake Ratio: 29 % (ref 24–39)
T4, Total: 6.7 ug/dL (ref 4.5–12.0)
TSH: 1.24 u[IU]/mL (ref 0.450–4.500)

## 2015-04-07 ENCOUNTER — Encounter: Payer: Self-pay | Admitting: Neurology

## 2015-04-07 DIAGNOSIS — I63412 Cerebral infarction due to embolism of left middle cerebral artery: Secondary | ICD-10-CM | POA: Insufficient documentation

## 2015-04-08 ENCOUNTER — Telehealth: Payer: Self-pay | Admitting: *Deleted

## 2015-04-08 ENCOUNTER — Telehealth: Payer: Self-pay | Admitting: Cardiology

## 2015-04-08 NOTE — Telephone Encounter (Signed)
Returned call.  Patient's wife reports that he collapsed on the floor on the morning of 04/06/15.  He got back up and told his wife that "he just fell".  Spoke with patient.  Patient felt weak immediately afterward, but states his symptoms quickly resolved.  He states he felt his vision have a "snow effect" prior to this episode while he was struggling to read the fine print on a bottle.  He landed in a pile of clothes and denies hitting his head.  Patient states he did not think to check his B/P after the episode.  Advised patient to check his B/P, use his symptom activator, and call us if an episode like this happens again.  He is aware that he should proceed to the ED with worsening symptoms.  Patient sent manual [employer] transmission for review.  Available EGMs suggest frequent PVCs but no other abnormalities.  EGMs printed for review by Dr. Curt Bears.  Call routed to Dr. Curt Bears for further recommendations.  Patient agreeable to wound check appointment on 04/17/15 at 9:30am.  He is appreciative of call and denies additional questions or concerns at this time.

## 2015-04-08 NOTE — Telephone Encounter (Addendum)
Dr Jaynee Eagles- Please advise  Called pt back. [facility]care advised pt to call PCP. They saw no change in HR or BP when event occurred. He decided to call our office first to see what Dr Jaynee Eagles suggested. He stated he must have "blacked out" because he does not remember everything. He has "snowy" vision. Advised Dr Jaynee Eagles with a pt. I will send her the message and either myself or Dr Jaynee Eagles will call back to advise. He verbalized understanding.

## 2015-04-08 NOTE — Telephone Encounter (Signed)
Dr Jaynee Eagles- FYI Called and spoke to wife about unremarkable labs per Dr Jaynee Eagles. She verbalized understanding.  She stated Saturday morning he collapsed. He did not hit head. Landed on pile of clothes. He was walking to the back kitchen and he collapsed. He was just standing, was not sitting. No headaches/no sx since episode. Has heart monitor on. Wife called heartcare center this morning. They are contacting the recording company to see if they caught episode. They told them they will call back to advise. Pt is going well today. No sx. Was doing well on Sunday. Advised if he has any new/worsening sx he should proceed to ED. She understands.

## 2015-04-08 NOTE — Telephone Encounter (Signed)
Patient called back to advise, there was no episode of AFIB, no sign of AFIB Saturday. Patient has questions, please call [phone].

## 2015-04-08 NOTE — Telephone Encounter (Signed)
-----   Message from Melvenia Beam, MD sent at 04/05/2015  1:15 PM EDT ----- Labs unremarkable thanks

## 2015-04-08 NOTE — Telephone Encounter (Signed)
New Message  Pt has recently had loop recorder placed- stated that on Sat 3/25 he had an event related to his recent TIA's- wanted to see if the loop recorder picked up anything on this event. Please call back and discuss.

## 2015-04-08 NOTE — Telephone Encounter (Signed)
Saturday morning he was walking and his vision got blurry, he blacked out for a split second, did not have good vision, it was snowy, he knew he was falling, no confusion, he was going down and fell on the floor. No nausea, no dizziness, no CP, no SOB. He is completely back to baseline. Today he was walking and then got light headed and dizzy and he is breathless and he struggles for 20-30 seconds to breathe. Multiple episodes. He notices these changes only when he is standing or walking. And he also notices similar symptoms when he stands from a seated position. Sounds maybe like he has orthostatic hypotension. He should have orthostatic blood pressure vitals. Joshua [name], maybe we can have patient in just for some orthostatics. Tomorrow please?  Will also cc Dr. Brigitte Pulse on this correspondence to see if he agrees and if any adjustments can me made to his BP medications.

## 2015-04-09 ENCOUNTER — Other Ambulatory Visit: Payer: Self-pay | Admitting: Neurology

## 2015-04-09 MED ORDER — METOPROLOL SUCCINATE ER 25 MG PO TB24: 25.0000 mg | ORAL_TABLET | Freq: Every day | ORAL | Status: DC

## 2015-04-09 NOTE — Telephone Encounter (Signed)
Called pt. He will come today at 430pm to do orthostatic vitals. No appt necessary per Dr Jaynee Eagles. Advised we do close at 5pm and to be here by 430pm. He verbalized understanding.

## 2015-04-10 ENCOUNTER — Encounter: Payer: Self-pay | Admitting: Cardiology

## 2015-04-10 NOTE — Telephone Encounter (Signed)
F/u  Pt returning RN phone call. Please call back and discuss.   

## 2015-04-10 NOTE — Telephone Encounter (Signed)
Discussed frequent PVCs noted on monitor.  Patient tells me that he has had this for years and it can be "bad at times" - meaning frequent episodes w/o symptoms. States that this was noted years ago at [facility], back in the 90s, when he was being treated for barrett's esophagus.   States that if he is sitting, he is ok.  But if stands can get "swimmy headed".  This has been occurring for greater than a year. Saw Neurologist yesterday who decreased his Metoprolol from 50 to 25.  Advised to call office back if reoccurrence of syncope or increase in PVCs or symptomatic. Will also inform Dr. Curt Bears (who was wanting OV to discuss PVCs) of above information.  Patient aware I will call him back if Dr. Curt Bears is not ok with stated plan.

## 2015-04-10 NOTE — Telephone Encounter (Signed)
Dr. Curt Bears is agreeable to below plan of care.

## 2015-04-12 DIAGNOSIS — L821 Other seborrheic keratosis: Secondary | ICD-10-CM | POA: Diagnosis not present

## 2015-04-12 DIAGNOSIS — D0461 Carcinoma in situ of skin of right upper limb, including shoulder: Secondary | ICD-10-CM | POA: Diagnosis not present

## 2015-04-15 ENCOUNTER — Ambulatory Visit: Payer: Medicare HMO | Admitting: Occupational Therapy

## 2015-04-17 ENCOUNTER — Encounter: Payer: Self-pay | Admitting: Cardiology

## 2015-04-17 ENCOUNTER — Ambulatory Visit (INDEPENDENT_AMBULATORY_CARE_PROVIDER_SITE_OTHER): Payer: Medicare HMO | Admitting: *Deleted

## 2015-04-17 DIAGNOSIS — Z95818 Presence of other cardiac implants and grafts: Secondary | ICD-10-CM

## 2015-04-17 LAB — CUP PACEART INCLINIC DEVICE CHECK: Date Time Interrogation Session: 20170405125329

## 2015-04-17 NOTE — Progress Notes (Signed)
Loop wound check in clinic. Incision edges approximated; without redness, swelling or drainage. Patient educated about wound care and Carelink monitoring. Battery status: good. R-waves 0.38mV. 1 symptom episode- 04/04/15- patient recalls feeling dizzy and palpitations- EGM shows sinus with PVCs. 155 AF episodes (4% burden)- all available EGMs show sinus with frequent PVCs. Ectopy rejection is programmed aggressive. Monthly summary reports and ROV with WC PRN.

## 2015-04-18 ENCOUNTER — Telehealth: Payer: Self-pay | Admitting: *Deleted

## 2015-04-18 ENCOUNTER — Encounter: Payer: Self-pay | Admitting: Occupational Therapy

## 2015-04-18 ENCOUNTER — Ambulatory Visit: Payer: Medicare HMO | Attending: Neurology | Admitting: Occupational Therapy

## 2015-04-18 DIAGNOSIS — R278 Other lack of coordination: Secondary | ICD-10-CM

## 2015-04-18 DIAGNOSIS — M79641 Pain in right hand: Secondary | ICD-10-CM | POA: Insufficient documentation

## 2015-04-18 DIAGNOSIS — M79642 Pain in left hand: Secondary | ICD-10-CM | POA: Diagnosis not present

## 2015-04-18 DIAGNOSIS — M6281 Muscle weakness (generalized): Secondary | ICD-10-CM | POA: Insufficient documentation

## 2015-04-18 NOTE — Telephone Encounter (Signed)
Called patient to discuss follow up (former Joshua [name] patient, and not assigned to another physician after Joshua [name] retired). Patient is agreeable to seeing Dr. Curt Bears being that he LINQ and PVCs and appropriate for EP. Patient understands office will call him to arrange yearly f/u in June with Dr. Curt Bears.

## 2015-04-18 NOTE — Therapy (Signed)
[facility] [address] [place], [place], [address] Phone: [phone]   Fax:  [phone]  Occupational Therapy Evaluation  Patient Details  Name: Joshua [name] MRN: [id number] Date of Birth: 12/23/1945 Referring Provider: Dr. Sarina Ill  Encounter Date: 04/18/2015      OT End of Session - 04/18/15 0903    Visit Number 1   Number of Visits 9   Date for OT Re-Evaluation 05/18/15   Authorization Type Aetna MCR - G code needed   OT Start Time 0800   OT Stop Time 0850   OT Time Calculation (min) 50 min   Activity Tolerance Patient tolerated treatment well      Past Medical History  Diagnosis Date  . Vertigo   . Sleep apnea     pt denies  . Palpitations     Benign PVCs  . Dyslipidemia   . GERD (gastroesophageal reflux disease)   . Chest pain     Coronaries normal 1996 /  nuclear..06/2002..normal...EF  56% /  stress echo.. May, 2011.... no  scar or ischemia... rate related RBBB  . RBBB (right bundle branch block)     rate related  . Hx of colonoscopy   . Ejection fraction     EF 60%, stress echo, May, 2011  . Carotid artery disease ([place])     Carotid Doppler normal August, 2007  . Tremor     Hand tremor  . Diverticulosis   . Shingles   . IFG (impaired fasting glucose)   . Allergic rhinitis   . Hx of colonic polyps     adenomatous  . Asthma   . Anal fissure   . Arthritis   . Allergy   . Anxiety     from chronic pain from surgery- on Cymbalta  . Cataract   . Hyperlipidemia   . HTN (hypertension)     takes Metoprolol for PVC control  . TIA (transient ischemic attack) 02/2015  . Headache     Past Surgical History  Procedure Laterality Date  . Back surgery  2002,2009    x 6  . Neck surgery  2002  . Hernia repair      laprascopic  . Rotator cuff repair Left   . Cholecystectomy  11/30/2012    with IOC  . Upper gastrointestinal endoscopy    . Colonoscopy    . Tee without cardioversion N/A  04/04/2015    Procedure: TRANSESOPHAGEAL ECHOCARDIOGRAM (TEE);  Surgeon: Lelon Perla, MD;  Location: [facility];  Service: Cardiovascular;  Laterality: N/A;  . Ep implantable device N/A 04/04/2015    Procedure: Loop Recorder Insertion;  Surgeon: Will Meredith Leeds, MD;  Location: [facility];  Service: Cardiovascular;  Laterality: N/A;    There were no vitals filed for this visit.  Visit Diagnosis:  Other lack of coordination - Plan: Ot plan of care cert/re-cert  Pain in right hand - Plan: Ot plan of care cert/re-cert  Pain in left hand - Plan: Ot plan of care cert/re-cert  Muscle weakness (generalized) - Plan: Ot plan of care cert/re-cert      Subjective Assessment - 04/18/15 0808    Subjective  Most of my hand problems are from the arthritis, I have noticed more dropping in my Rt hand since CVA   Pertinent History severe arthritis bilateral hands at thumbs and wrists, neck surgery, back surgeries x 6   Limitations Lt AFO (Due to back problems), loop recorder, no lifting > 20  lbs, avoid arching/twisting of back   Patient Stated Goals Limber this Rt hand up   Currently in Pain? Yes  O.T. not addressing - outside scope of practice and being medically managed   Pain Score 6    Pain Location Back   Pain Type Chronic pain   Pain Onset More than a month ago   Pain Frequency Intermittent   Aggravating Factors  certain movements, standing too long   Pain Relieving Factors tramadol           [facility] OT Assessment - 04/18/15 0001    Assessment   Diagnosis Lt MCA CVA  Rt hemiparesis   Referring Provider Dr. Sarina Ill   Onset Date 03/05/15   Assessment Decreased fine motor skills Rt dominant hand   Prior Therapy Therapy for back, Lt shoulder   Precautions   Precautions --  no lifting > 20 lbs, avoid twisting/arching, loop recorder,    Required Braces or Orthoses --  Lt AFO (Anterior)   Balance Screen   Has the patient fallen in the past 6 months Yes   How many  times? 2   Has the patient had a decrease in activity level because of a fear of falling?  No   Is the patient reluctant to leave their home because of a fear of falling?  No   Home  Environment   Writer;Door  built in Civil engineer, contracting, Surveyor, minerals   Additional Comments Pt lives in 1 story home with 4 steep steps to enter from garage with railing on Lt going up, 4 regular steps to enter from front door. DME: Raised toilets, FWW walker, BSC.    Lives With Spouse   Prior Function   Level of Independence Independent   Vocation Retired   Engineer, manufacturing systems   ADL   ADL comments Reports increased dropping Rt hand from CVA. Difficulty with buttons and tying shoes but mostly d/t arthritis. Mod I for all ADLS with extra time and difficulty. Independent with medication management and driving. Wife does majority of cooking and Merchandiser, retail Status Independent   Written Expression   Dominant Hand Right   Handwriting 50% legible  but not from CVA, is from arthritis   Vision - History   Baseline Vision Wears glasses all the time   Visual History --  optic n. cups are large (premorbid)    Vision Assessment   Comment denies change from CVA, but reports bluriness recently   Cognition   Overall Cognitive Status Within Functional Limits for tasks assessed   Sensation   Light Touch Appears Intact   Additional Comments Pt reports diminshed sensation/slight numbness dorsal hand along radial side   Coordination   9 Hole Peg Test Right;Left   Right 9 Hole Peg Test 31.91 sec   Left 9 Hole Peg Test 35.25 sec.   pt had increased tremors on Lt hand   Tremors benign tremors (premorbid) - resting   Edema   Edema none   ROM / Strength   AROM / PROM / Strength AROM   AROM   Overall AROM Comments BUE AROM WFL's   Hand Function   Right Hand Grip (lbs) 39 lbs   Right Hand Lateral Pinch 17 lbs   Left Hand Grip (lbs) 80 lbs   Left  Hand Lateral Pinch 20 lbs  OT Long Term Goals - 05/14/15 0904    OT LONG TERM GOAL #1   Title Pt independent with HEP for coordination and putty (due 05/18/15)   Time 4   Period Weeks   Status New   OT LONG TERM GOAL #2   Title Pt to verbalize understanding with joint protection techniques, compensatory strategies, and potential A/E needs to help alleviate pain and maintain joint integrity bilateral hands (due to arthritis)   Time 4   Period Weeks   Status New   OT LONG TERM GOAL #3   Title Pt to increase grip strength Rt hand by 10 lbs or greater from eval    Baseline 39 lbs   Time 4   Period Weeks   Status New   OT LONG TERM GOAL #4   Title Pt to report less drops Rt hand during functional daily activities   Time 4   Period Weeks   Status New               Plan - 05-14-15 1339    Clinical Impression Statement Pt is a 70 y.o. male who presents to outpatient rehab s/p small Lt MCA CVA which affected fine motor skills and strength RUE. Pt also with significant arthritis bilateral CMC joints and wrists affecting daily function and tasks. Pt also has Lt LE AFO and multiple back surgeries   Pt will benefit from skilled therapeutic intervention in order to improve on the following deficits (Retired) Decreased coordination;Impaired flexibility;Impaired sensation;Decreased activity tolerance;Decreased knowledge of use of DME;Impaired UE functional use;Pain;Decreased mobility;Decreased strength   Rehab Potential Good   OT Frequency 2x / week   OT Duration 4 weeks  plus evaluation   OT Treatment/Interventions Self-care/ADL training;Patient/family education;Functional Mobility Training;Ultrasound;Manual Therapy;Splinting;Energy conservation;Therapeutic exercises;Therapeutic activities;DME and/or AE instruction;Parrafin;Electrical Stimulation;Fluidtherapy;Moist Heat;Contrast Bath;Passive range of motion   Plan coordination and putty HEP  (following visit: A/E recommendations, arthritis booklet)   Consulted and Agree with Plan of Care Patient          G-Codes - 05/14/2015 1345    Functional Assessment Tool Used RUE: Grip = 39 lbs, 9 hole peg test = 31.91 sec., reports increased drops from Rt hand   Functional Limitation Carrying, moving and handling objects   Carrying, Moving and Handling Objects Current Status [id number]) At least 20 percent but less than 40 percent impaired, limited or restricted   Carrying, Moving and Handling Objects Goal Status [id number]) At least 1 percent but less than 20 percent impaired, limited or restricted      Problem List Patient Active Problem List   Diagnosis Date Noted  . Embolic stroke involving left middle cerebral artery ([place]) 04/07/2015  . Acute CVA (cerebrovascular accident) ([place]) 03/06/2015  . Stroke (cerebrum) ([place]) 03/05/2015  . CVA (cerebral infarction) 03/05/2015  . Weakness of right upper extremity   . Tremor   . Ejection fraction   . Vertigo   . Sleep apnea   . Palpitations   . Dyslipidemia   . GERD (gastroesophageal reflux disease)   . HTN (hypertension)   . Chest pain   . RBBB (right bundle branch block)   . Ejection fraction   . Carotid artery disease ([place])     Carey Bullocks, OTR/L 05-14-15, 1:48 PM  [facility] [address] [place], [place], [address] Phone: [phone]   Fax:  [phone]  Name: Leangelo Hartl MRN: [id number] Date of Birth: 1945/03/28

## 2015-04-19 ENCOUNTER — Encounter: Payer: Self-pay | Admitting: Cardiology

## 2015-04-22 ENCOUNTER — Encounter: Payer: Self-pay | Admitting: Occupational Therapy

## 2015-04-22 ENCOUNTER — Ambulatory Visit: Payer: Medicare HMO | Admitting: Occupational Therapy

## 2015-04-22 ENCOUNTER — Encounter: Payer: Self-pay | Admitting: Cardiology

## 2015-04-22 DIAGNOSIS — M6281 Muscle weakness (generalized): Secondary | ICD-10-CM

## 2015-04-22 DIAGNOSIS — M79642 Pain in left hand: Secondary | ICD-10-CM

## 2015-04-22 DIAGNOSIS — M79641 Pain in right hand: Secondary | ICD-10-CM

## 2015-04-22 DIAGNOSIS — R278 Other lack of coordination: Secondary | ICD-10-CM | POA: Diagnosis not present

## 2015-04-22 NOTE — Patient Instructions (Signed)
  Coordination Activities  Perform the following activities for 15 minutes 1-2 times per day with right hand(s).   Rotate ball in fingertips (clockwise and counter-clockwise).  Toss ball in air and catch with the same hand.  Flip cards 1 at a time as fast as you can.  Deal cards with your thumb (Hold deck in hand and push card off top with thumb).  Rotate card in hand (clockwise and counter-clockwise).  Pick up coins, buttons, marbles, dried beans/pasta of different sizes and place in container.  Pick up coins one at a time until you get 5-10 in your hand, then move coins from palm to fingertips to stack one at a time.   1. Grip Strengthening (Resistive Putty)   Squeeze putty using thumb and all fingers. Repeat 15__ times. Do __2__ sessions per day.   2. Roll putty into tube on table and pinch between index finger and thumb, then long finger and thumb x 10 reps each, 2x/day.       Copyright  VHI. All rights reserved.

## 2015-04-22 NOTE — Therapy (Signed)
[facility] [address] [place], [place], [address] Phone: [phone]   Fax:  [phone]  Occupational Therapy Treatment  Patient Details  Name: Joshua [name] MRN: [id number] Date of Birth: [birth year] Referring Provider: Dr. Sarina Ill  Encounter Date: 04/22/2015      OT End of Session - 04/22/15 0944    Visit Number 2   Number of Visits 9   Date for OT Re-Evaluation 05/18/15   Authorization Type Aetna MCR - G code needed   OT Start Time 0800   OT Stop Time 0845   OT Time Calculation (min) 45 min   Activity Tolerance Patient tolerated treatment well      Past Medical History  Diagnosis Date  . Vertigo   . Sleep apnea     pt denies  . Palpitations     Benign PVCs  . Dyslipidemia   . GERD (gastroesophageal reflux disease)   . Chest pain     Coronaries normal 1996 /  nuclear..06/2002..normal...EF  56% /  stress echo.. May, 2011.... no  scar or ischemia... rate related RBBB  . RBBB (right bundle branch block)     rate related  . Hx of colonoscopy   . Ejection fraction     EF 60%, stress echo, May, 2011  . Carotid artery disease ([place])     Carotid Doppler normal August, 2007  . Tremor     Hand tremor  . Diverticulosis   . Shingles   . IFG (impaired fasting glucose)   . Allergic rhinitis   . Hx of colonic polyps     adenomatous  . Asthma   . Anal fissure   . Arthritis   . Allergy   . Anxiety     from chronic pain from surgery- on Cymbalta  . Cataract   . Hyperlipidemia   . HTN (hypertension)     takes Metoprolol for PVC control  . TIA (transient ischemic attack) 02/2015  . Headache     Past Surgical History  Procedure Laterality Date  . Back surgery  2002,2009    x 6  . Neck surgery  2002  . Hernia repair      laprascopic  . Rotator cuff repair Left   . Cholecystectomy  11/30/2012    with IOC  . Upper gastrointestinal endoscopy    . Colonoscopy    . Tee without cardioversion N/A  04/04/2015    Procedure: TRANSESOPHAGEAL ECHOCARDIOGRAM (TEE);  Surgeon: Lelon Perla, MD;  Location: [facility];  Service: Cardiovascular;  Laterality: N/A;  . Ep implantable device N/A 04/04/2015    Procedure: Loop Recorder Insertion;  Surgeon: Will Meredith Leeds, MD;  Location: [facility];  Service: Cardiovascular;  Laterality: N/A;    There were no vitals filed for this visit.      Subjective Assessment - 04/22/15 0848    Subjective  My back down to my buttocks is real weak and sometimes shooting pain   Pertinent History severe arthritis bilateral hands at thumbs and wrists, neck surgery, back surgeries x 6   Limitations Lt AFO (Due to back problems), loop recorder, no lifting > 20 lbs, avoid arching/twisting of back   Patient Stated Goals Limber this Rt hand up   Currently in Pain? Yes  O.T. not addressing                      OT Treatments/Exercises (OP) - 04/22/15 0001    ADLs  Writing Practiced writing with various pens. Pt with improved legibility and control with Pen Again. Pt also practiced with felt tip pen with less tremors.    ADL Comments Provided arthritis booklet and reviewed joint protection techniques and compensatory strategies for tasks that increase pain in hands   Exercises   Exercises Hand   Hand Exercises   Other Hand Exercises See HEP for putty ex's (green putty issued for home use)    Fine Motor Coordination   Other Fine Motor Exercises see HEP                 OT Education - 04/22/15 0858    Education provided Yes   Education Details coordination and putty HEP, Arthritis booklet   Person(s) Educated Patient   Methods Explanation;Demonstration;Handout   Comprehension Verbalized understanding;Returned demonstration             OT Long Term Goals - 04/18/15 0904    OT LONG TERM GOAL #1   Title Pt independent with HEP for coordination and putty (due 05/18/15)   Time 4   Period Weeks   Status New   OT LONG  TERM GOAL #2   Title Pt to verbalize understanding with joint protection techniques, compensatory strategies, and potential A/E needs to help alleviate pain and maintain joint integrity bilateral hands (due to arthritis)   Time 4   Period Weeks   Status New   OT LONG TERM GOAL #3   Title Pt to increase grip strength Rt hand by 10 lbs or greater from eval    Baseline 39 lbs   Time 4   Period Weeks   Status New   OT LONG TERM GOAL #4   Title Pt to report less drops Rt hand during functional daily activities   Time 4   Period Weeks   Status New               Plan - 04/22/15 0944    Clinical Impression Statement Pt progressing towards goals. Pt responding well to joint protection techniques and compensatory strategies.    Plan A/E recommendations (button hook, trio bottle opener, etc), review HEP   Consulted and Agree with Plan of Care Patient       Visit Diagnosis: Other lack of coordination  Pain in right hand  Pain in left hand  Muscle weakness (generalized)    Problem List Patient Active Problem List   Diagnosis Date Noted  . Embolic stroke involving left middle cerebral artery ([place]) 04/07/2015  . Acute CVA (cerebrovascular accident) ([place]) 03/06/2015  . Stroke (cerebrum) ([place]) 03/05/2015  . CVA (cerebral infarction) 03/05/2015  . Weakness of right upper extremity   . Tremor   . Ejection fraction   . Vertigo   . Sleep apnea   . Palpitations   . Dyslipidemia   . GERD (gastroesophageal reflux disease)   . HTN (hypertension)   . Chest pain   . RBBB (right bundle branch block)   . Ejection fraction   . Carotid artery disease ([place])     Carey Bullocks, OTR/L 04/22/2015, 9:46 AM  [facility] [address] [place], [place], [address] Phone: [phone]   Fax:  [phone]  Name: Joshua [name] MRN: [id number] Date of Birth: [birth year]

## 2015-04-23 DIAGNOSIS — M4716 Other spondylosis with myelopathy, lumbar region: Secondary | ICD-10-CM | POA: Diagnosis not present

## 2015-04-26 ENCOUNTER — Encounter: Payer: Self-pay | Admitting: Cardiology

## 2015-04-29 ENCOUNTER — Telehealth: Payer: Self-pay | Admitting: *Deleted

## 2015-04-29 NOTE — Telephone Encounter (Signed)
[facility] requesting call back, gave device clinic phone number.  After further review of episodes, Dr. Curt Bears does not feel that AF episodes on ILR are true and do not warrant [place] at this time.  Can cancel appointment scheduled for 05/01/15.

## 2015-04-29 NOTE — Telephone Encounter (Signed)
Pt returning your call. Call back at [phone].

## 2015-04-29 NOTE — Telephone Encounter (Signed)
Call patient to make him aware of Dr. Macky Lower recommendations to continue monitoring via home monitor.  Advised that if he has true atrial fibrillation in the future, we will call him.  Patient is agreeable to this plan and is agreeable to canceling appointment scheduled for 05/01/15.  He denies additional questions or concerns at this time and is appreciative of call.

## 2015-04-30 ENCOUNTER — Encounter: Payer: Self-pay | Admitting: Cardiology

## 2015-04-30 ENCOUNTER — Encounter: Payer: Self-pay | Admitting: Occupational Therapy

## 2015-04-30 ENCOUNTER — Ambulatory Visit: Payer: Medicare HMO | Admitting: Occupational Therapy

## 2015-04-30 DIAGNOSIS — M6281 Muscle weakness (generalized): Secondary | ICD-10-CM

## 2015-04-30 DIAGNOSIS — R278 Other lack of coordination: Secondary | ICD-10-CM | POA: Diagnosis not present

## 2015-04-30 DIAGNOSIS — M79642 Pain in left hand: Secondary | ICD-10-CM

## 2015-04-30 DIAGNOSIS — M79641 Pain in right hand: Secondary | ICD-10-CM

## 2015-04-30 NOTE — Therapy (Signed)
[facility] [address] [place], [place], [address] Phone: [phone]   Fax:  [phone]  Occupational Therapy Treatment  Patient Details  Name: Joshua [name] MRN: [id number] Date of Birth: 07/19/1945 Referring Provider: Dr. Sarina Ill  Encounter Date: 04/30/2015      OT End of Session - 04/30/15 1201    Visit Number 3   Number of Visits 9   Date for OT Re-Evaluation 05/18/15   Authorization Type [employer] MCR - G code needed   Authorization - Visit Number 3   Authorization - Number of Visits 10   OT Start Time 1100   OT Stop Time 1143   OT Time Calculation (min) 43 min   Activity Tolerance Patient tolerated treatment well      Past Medical History  Diagnosis Date  . Vertigo   . Sleep apnea     pt denies  . Palpitations     Benign PVCs  . Dyslipidemia   . GERD (gastroesophageal reflux disease)   . Chest pain     Coronaries normal 1996 /  nuclear..06/2002..normal...EF  56% /  stress echo.. May, 2011.... no  scar or ischemia... rate related RBBB  . RBBB (right bundle branch block)     rate related  . Hx of colonoscopy   . Ejection fraction     EF 60%, stress echo, May, 2011  . Carotid artery disease ([place])     Carotid Doppler normal August, 2007  . Tremor     Hand tremor  . Diverticulosis   . Shingles   . IFG (impaired fasting glucose)   . Allergic rhinitis   . Hx of colonic polyps     adenomatous  . Asthma   . Anal fissure   . Arthritis   . Allergy   . Anxiety     from chronic pain from surgery- on Cymbalta  . Cataract   . Hyperlipidemia   . HTN (hypertension)     takes Metoprolol for PVC control  . TIA (transient ischemic attack) 02/2015  . Headache     Past Surgical History  Procedure Laterality Date  . Back surgery  2002,2009    x 6  . Neck surgery  2002  . Hernia repair      laprascopic  . Rotator cuff repair Left   . Cholecystectomy  11/30/2012    with IOC  . Upper  gastrointestinal endoscopy    . Colonoscopy    . Tee without cardioversion N/A 04/04/2015    Procedure: TRANSESOPHAGEAL ECHOCARDIOGRAM (TEE);  Surgeon: Lelon Perla, MD;  Location: [facility];  Service: Cardiovascular;  Laterality: N/A;  . Ep implantable device N/A 04/04/2015    Procedure: Loop Recorder Insertion;  Surgeon: Will Meredith Leeds, MD;  Location: [facility];  Service: Cardiovascular;  Laterality: N/A;    There were no vitals filed for this visit.      Subjective Assessment - 04/30/15 1104    Subjective  The Dr. thought I had A-fib, but then he confirmed it wasn't A-fib   Pertinent History severe arthritis bilateral hands at thumbs and wrists, neck surgery, back surgeries x 6   Limitations Lt AFO (Due to back problems), loop recorder, no lifting > 20 lbs, avoid arching/twisting of back   Patient Stated Goals Limber this Rt hand up   Currently in Pain? Yes  O.T. not addressing  OT Treatments/Exercises (OP) - 04/30/15 0001    ADLs   ADL Comments Discussed potential A/E needs and provided handouts and told how/where to purchase. Pt return demo of button hook and compression stocking dressing aid with success. Pt also issued handouts for [place] shoe horn, trio bottle opener, and lace locks for shoes (Pt currently using lace locks with success)                OT Education - 04/30/15 1200    Education provided Yes   Education Details A/E recommendations   Person(s) Educated Patient   Methods Explanation;Handout   Comprehension Verbalized understanding;Returned demonstration             OT Long Term Goals - 04/30/15 1207    OT LONG TERM GOAL #1   Title Pt independent with HEP for coordination and putty (due 05/18/15)   Time 4   Period Weeks   Status Achieved   OT LONG TERM GOAL #2   Title Pt to verbalize understanding with joint protection techniques, compensatory strategies, and potential A/E needs to help  alleviate pain and maintain joint integrity bilateral hands (due to arthritis)   Time 4   Period Weeks   Status Achieved   OT LONG TERM GOAL #3   Title Pt to increase grip strength Rt hand by 10 lbs or greater from eval    Baseline 39 lbs   Time 4   Period Weeks   Status On-going   OT LONG TERM GOAL #4   Title Pt to report less drops Rt hand during functional daily activities   Time 4   Period Weeks   Status On-going               Plan - 04/30/15 1207    Clinical Impression Statement Pt met LTG's #1 and #2 today. Pt with increased ease and independence with ADLS using A/E   OT Frequency 2x / week   OT Treatment/Interventions Self-care/ADL training;Patient/family education;Functional Mobility Training;Ultrasound;Manual Therapy;Splinting;Energy conservation;Therapeutic exercises;Therapeutic activities;DME and/or AE instruction;Parrafin;Electrical Stimulation;Fluidtherapy;Moist Heat;Contrast Bath;Passive range of motion   Plan continue with remaining LTG's   Consulted and Agree with Plan of Care Patient      Patient will benefit from skilled therapeutic intervention in order to improve the following deficits and impairments:  Decreased coordination, Impaired flexibility, Impaired sensation, Decreased activity tolerance, Decreased knowledge of use of DME, Impaired UE functional use, Pain, Decreased mobility, Decreased strength  Visit Diagnosis: Other lack of coordination  Pain in right hand  Pain in left hand  Muscle weakness (generalized)    Problem List Patient Active Problem List   Diagnosis Date Noted  . Embolic stroke involving left middle cerebral artery ([place]) 04/07/2015  . Acute CVA (cerebrovascular accident) ([facility]) 03/06/2015  . Stroke (cerebrum) ([employer]) 03/05/2015  . CVA (cerebral infarction) 03/05/2015  . Weakness of right upper extremity   . Tremor   . Ejection fraction   . Vertigo   . Sleep apnea   . Palpitations   . Dyslipidemia   . GERD  (gastroesophageal reflux disease)   . HTN (hypertension)   . Chest pain   . RBBB (right bundle branch block)   . Ejection fraction   . Carotid artery disease ([place])     Carey Bullocks, OTR/L 04/30/2015, 12:11 PM  [facility] [address] [place], [place], [address] Phone: [phone]   Fax:  [phone]  Name: Joshua [name] MRN: [id number] Date of Birth: [birth year]

## 2015-05-01 ENCOUNTER — Encounter: Payer: Medicare HMO | Admitting: Cardiology

## 2015-05-02 ENCOUNTER — Ambulatory Visit: Payer: Medicare HMO | Admitting: Occupational Therapy

## 2015-05-02 ENCOUNTER — Encounter: Payer: Self-pay | Admitting: Occupational Therapy

## 2015-05-02 DIAGNOSIS — M6281 Muscle weakness (generalized): Secondary | ICD-10-CM

## 2015-05-02 DIAGNOSIS — R278 Other lack of coordination: Secondary | ICD-10-CM | POA: Diagnosis not present

## 2015-05-02 NOTE — Therapy (Signed)
[facility] [address] [place], [place], [address] Phone: [phone]   Fax:  [phone]  Occupational Therapy Treatment  Patient Details  Name: Joshua [name] MRN: [id number] Date of Birth: 03-29-1945 Referring Provider: Dr. Sarina Ill  Encounter Date: 05/02/2015      OT End of Session - 05/02/15 1009    Visit Number 4   Number of Visits 9   Date for OT Re-Evaluation 05/18/15   Authorization Type [employer] MCR - G code needed   Authorization - Visit Number 4   Authorization - Number of Visits 10   OT Start Time 0930   OT Stop Time 1010   OT Time Calculation (min) 40 min   Activity Tolerance Patient tolerated treatment well      Past Medical History  Diagnosis Date  . Vertigo   . Sleep apnea     pt denies  . Palpitations     Benign PVCs  . Dyslipidemia   . GERD (gastroesophageal reflux disease)   . Chest pain     Coronaries normal 1996 /  nuclear..06/2002..normal...EF  56% /  stress echo.. May, 2011.... no  scar or ischemia... rate related RBBB  . RBBB (right bundle branch block)     rate related  . Hx of colonoscopy   . Ejection fraction     EF 60%, stress echo, May, 2011  . Carotid artery disease ([facility])     Carotid Doppler normal August, 2007  . Tremor     Hand tremor  . Diverticulosis   . Shingles   . IFG (impaired fasting glucose)   . Allergic rhinitis   . Hx of colonic polyps     adenomatous  . Asthma   . Anal fissure   . Arthritis   . Allergy   . Anxiety     from chronic pain from surgery- on Cymbalta  . Cataract   . Hyperlipidemia   . HTN (hypertension)     takes Metoprolol for PVC control  . TIA (transient ischemic attack) 02/2015  . Headache     Past Surgical History  Procedure Laterality Date  . Back surgery  2002,2009    x 6  . Neck surgery  2002  . Hernia repair      laprascopic  . Rotator cuff repair Left   . Cholecystectomy  11/30/2012    with IOC  . Upper  gastrointestinal endoscopy    . Colonoscopy    . Tee without cardioversion N/A 04/04/2015    Procedure: TRANSESOPHAGEAL ECHOCARDIOGRAM (TEE);  Surgeon: Lelon Perla, MD;  Location: [facility];  Service: Cardiovascular;  Laterality: N/A;  . Ep implantable device N/A 04/04/2015    Procedure: Loop Recorder Insertion;  Surgeon: Will Meredith Leeds, MD;  Location: [facility];  Service: Cardiovascular;  Laterality: N/A;    There were no vitals filed for this visit.      Subjective Assessment - 05/02/15 0937    Subjective  I mostly drop my pills or smaller items   Pertinent History severe arthritis bilateral hands at thumbs and wrists, neck surgery, back surgeries x 6   Limitations Lt AFO (Due to back problems), loop recorder, no lifting > 20 lbs, avoid arching/twisting of back   Patient Stated Goals Limber this Rt hand up   Currently in Pain? Yes   Pain Score 5    Pain Location Back  O.T. not addressing  OT Treatments/Exercises (OP) - 05/02/15 0001    Hand Exercises   Other Hand Exercises Gripper set at 25 lbs resistance to pick up blocks Rt hand for sustained grip strength. Pt with mild diffiuclty, 2 drops and slight discomfort    Other Hand Exercises Removing clothespins from antenna using 3 tip pinch for increased pinch strength (yellow to black resistance)    Fine Motor Coordination   Fine Motor Coordination Small Pegboard   Small Pegboard placing small pegs in pegboard with focus on trying not to drop pegs for placement. Pt had no drops for 5 rows, placing one at a time. Pt then removing pegs manipulating 5 pegs at a time (fingertips to/from palm) with min drops.                      OT Long Term Goals - 04/30/15 1207    OT LONG TERM GOAL #1   Title Pt independent with HEP for coordination and putty (due 05/18/15)   Time 4   Period Weeks   Status Achieved   OT LONG TERM GOAL #2   Title Pt to verbalize understanding with  joint protection techniques, compensatory strategies, and potential A/E needs to help alleviate pain and maintain joint integrity bilateral hands (due to arthritis)   Time 4   Period Weeks   Status Achieved   OT LONG TERM GOAL #3   Title Pt to increase grip strength Rt hand by 10 lbs or greater from eval    Baseline 39 lbs   Time 4   Period Weeks   Status On-going   OT LONG TERM GOAL #4   Title Pt to report less drops Rt hand during functional daily activities   Time 4   Period Weeks   Status On-going               Plan - 05/02/15 1012    Clinical Impression Statement Pt progressing with sustained grip strength and less drops with small items (pegs)    OT Frequency 2x / week   OT Treatment/Interventions Self-care/ADL training;Patient/family education;Functional Mobility Training;Ultrasound;Manual Therapy;Splinting;Energy conservation;Therapeutic exercises;Therapeutic activities;DME and/or AE instruction;Parrafin;Electrical Stimulation;Fluidtherapy;Moist Heat;Contrast Bath;Passive range of motion   Plan nuts/bolts activity, assess grip strength and remaining goals, possible d/c next session - pt agreeable   Consulted and Agree with Plan of Care Patient      Patient will benefit from skilled therapeutic intervention in order to improve the following deficits and impairments:  Decreased coordination, Impaired flexibility, Impaired sensation, Decreased activity tolerance, Decreased knowledge of use of DME, Impaired UE functional use, Pain, Decreased mobility, Decreased strength  Visit Diagnosis: Muscle weakness (generalized)  Other lack of coordination    Problem List Patient Active Problem List   Diagnosis Date Noted  . Embolic stroke involving left middle cerebral artery ([facility]) 04/07/2015  . Acute CVA (cerebrovascular accident) ([facility]) 03/06/2015  . Stroke (cerebrum) ([facility]) 03/05/2015  . CVA (cerebral infarction) 03/05/2015  . Weakness of right upper extremity   . Tremor    . Ejection fraction   . Vertigo   . Sleep apnea   . Palpitations   . Dyslipidemia   . GERD (gastroesophageal reflux disease)   . HTN (hypertension)   . Chest pain   . RBBB (right bundle branch block)   . Ejection fraction   . Carotid artery disease ([place])     Carey Bullocks, OTR/L 05/02/2015, 10:14 AM  [facility] [address]  [place], [place], [address] Phone: [phone]   Fax:  [phone]  Name: Joshua [name] MRN: [id number] Date of Birth: 15-Oct-1945

## 2015-05-06 ENCOUNTER — Ambulatory Visit (INDEPENDENT_AMBULATORY_CARE_PROVIDER_SITE_OTHER): Payer: Medicare HMO | Admitting: *Deleted

## 2015-05-06 DIAGNOSIS — I639 Cerebral infarction, unspecified: Secondary | ICD-10-CM

## 2015-05-06 NOTE — Progress Notes (Signed)
Carelink Summary Report / Loop Recorder 

## 2015-05-07 ENCOUNTER — Ambulatory Visit: Payer: Medicare HMO | Admitting: Occupational Therapy

## 2015-05-07 ENCOUNTER — Encounter: Payer: Self-pay | Admitting: Occupational Therapy

## 2015-05-07 NOTE — Therapy (Signed)
[facility] [address] [place], [place], [address] Phone: [phone]   Fax:  [phone]  Patient Details  Name: Joshua [name] MRN: [id number] Date of Birth: 02/25/1945 Referring Provider:  Dr. Sarina Ill  Encounter Date: 05/07/2015   OCCUPATIONAL THERAPY DISCHARGE SUMMARY  Visits from Start of Care: 4  Current functional level related to goals / functional outcomes:     OT Long Term Goals - 04/30/15 1207    OT LONG TERM GOAL #1   Title Pt independent with HEP for coordination and putty (due 05/18/15)   Time 4   Period Weeks   Status Achieved   OT LONG TERM GOAL #2   Title Pt to verbalize understanding with joint protection techniques, compensatory strategies, and potential A/E needs to help alleviate pain and maintain joint integrity bilateral hands (due to arthritis)   Time 4   Period Weeks   Status Achieved   OT LONG TERM GOAL #3   Title Pt to increase grip strength Rt hand by 10 lbs or greater from eval    Baseline 39 lbs   Time 4   Period Weeks   Status Uknown - pt did not return for last visit to assess   OT LONG TERM GOAL #4   Title Pt to report less drops Rt hand during functional daily activities   Time 4   Period Weeks   Status Achieved        Remaining deficits: Rt hand strength Occasional pain (premorbid from arthritis)   Education / Equipment: HEP, joint protection techniques, A/E recommendations  Plan: Patient agrees to discharge.  Patient goals were partially met. Patient is being discharged due to meeting the stated rehab goals.  Pt did not return for last visit to assess grip strength; plan was to d/c at next O.T. visit?????         Carey Bullocks, OTR/L 05/07/2015, 8:09 AM  [facility] [address] [place], [place], [address] Phone: [phone]   Fax:  [phone]

## 2015-05-09 ENCOUNTER — Encounter: Payer: Medicare HMO | Admitting: Occupational Therapy

## 2015-05-14 ENCOUNTER — Encounter: Payer: Medicare HMO | Admitting: Occupational Therapy

## 2015-05-16 ENCOUNTER — Encounter: Payer: Medicare HMO | Admitting: Occupational Therapy

## 2015-05-21 ENCOUNTER — Encounter: Payer: Medicare HMO | Admitting: Occupational Therapy

## 2015-05-30 DIAGNOSIS — R7302 Impaired glucose tolerance (oral): Secondary | ICD-10-CM | POA: Diagnosis not present

## 2015-05-30 DIAGNOSIS — I10 Essential (primary) hypertension: Secondary | ICD-10-CM | POA: Diagnosis not present

## 2015-05-30 DIAGNOSIS — Z125 Encounter for screening for malignant neoplasm of prostate: Secondary | ICD-10-CM | POA: Diagnosis not present

## 2015-05-30 DIAGNOSIS — E784 Other hyperlipidemia: Secondary | ICD-10-CM | POA: Diagnosis not present

## 2015-06-02 ENCOUNTER — Other Ambulatory Visit: Payer: Self-pay | Admitting: Internal Medicine

## 2015-06-03 ENCOUNTER — Ambulatory Visit (INDEPENDENT_AMBULATORY_CARE_PROVIDER_SITE_OTHER): Payer: Medicare HMO | Admitting: *Deleted

## 2015-06-03 DIAGNOSIS — I639 Cerebral infarction, unspecified: Secondary | ICD-10-CM

## 2015-06-03 NOTE — Progress Notes (Signed)
Carelink Summary Report / Loop Recorder 

## 2015-06-07 DIAGNOSIS — I69954 Hemiplegia and hemiparesis following unspecified cerebrovascular disease affecting left non-dominant side: Secondary | ICD-10-CM | POA: Diagnosis not present

## 2015-06-07 DIAGNOSIS — Z1389 Encounter for screening for other disorder: Secondary | ICD-10-CM | POA: Diagnosis not present

## 2015-06-07 DIAGNOSIS — N401 Enlarged prostate with lower urinary tract symptoms: Secondary | ICD-10-CM | POA: Diagnosis not present

## 2015-06-07 DIAGNOSIS — I10 Essential (primary) hypertension: Secondary | ICD-10-CM | POA: Diagnosis not present

## 2015-06-07 DIAGNOSIS — Z0000 Encounter for general adult medical examination without abnormal findings: Secondary | ICD-10-CM | POA: Diagnosis not present

## 2015-06-07 DIAGNOSIS — R55 Syncope and collapse: Secondary | ICD-10-CM | POA: Diagnosis not present

## 2015-06-07 DIAGNOSIS — R251 Tremor, unspecified: Secondary | ICD-10-CM | POA: Diagnosis not present

## 2015-06-07 DIAGNOSIS — E784 Other hyperlipidemia: Secondary | ICD-10-CM | POA: Diagnosis not present

## 2015-06-07 DIAGNOSIS — J45909 Unspecified asthma, uncomplicated: Secondary | ICD-10-CM | POA: Diagnosis not present

## 2015-06-07 DIAGNOSIS — R7302 Impaired glucose tolerance (oral): Secondary | ICD-10-CM | POA: Diagnosis not present

## 2015-06-10 LAB — CUP PACEART REMOTE DEVICE CHECK: Date Time Interrogation Session: 20170422130706

## 2015-06-10 NOTE — Progress Notes (Signed)
Carelink summary report received. Battery status OK. Normal device function. No new tachy episodes, brady, or pause episodes. AF episodes are SR w PVC's, symptom episode bigeminal PVC's. Monthly summary reports and ROV/PRN

## 2015-06-11 DIAGNOSIS — Z1212 Encounter for screening for malignant neoplasm of rectum: Secondary | ICD-10-CM | POA: Diagnosis not present

## 2015-06-13 ENCOUNTER — Encounter: Payer: Self-pay | Admitting: *Deleted

## 2015-06-18 NOTE — Progress Notes (Signed)
Electrophysiology Office Note   Date:  06/19/2015   ID:  Joshua [name], DOB 08-May-1945, MRN [id number]  PCP:  Marton Redwood, MD  Primary Electrophysiologist:  Miki Labuda Meredith Leeds, MD    Chief Complaint  Patient presents with  . Loop recorder check  . Shortness of Breath     History of Present Illness: Joshua [name] is a 70 y.o. male who presents today for electrophysiology evaluation.   He has a history of CVA, OSA, HLD, GERD, HTN, RBBB.  His stroke occurred 02/2015.  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  A linq monitor was implanted.  He was found to have a high volume of PVCs on his LINQ.  He has been having significant shortness of breath. He does say that he's been sleeping with the head of the bed elevated about 6 inches. He does wake up at night feeling short of breath. He says that he can walk on flat ground without issues, but when and there is an incline, he has to stop due to leg pain and shortness of breath. He also has significant amounts of fatigue.   Today, he denies symptoms of palpitations, chest pain, lower extremity edema, claudication, dizziness, presyncope, syncope, bleeding, or neurologic sequela. The patient is tolerating medications without difficulties and is otherwise without complaint today.    Past Medical History  Diagnosis Date  . Vertigo   . Sleep apnea     pt denies  . Palpitations     Benign PVCs  . Dyslipidemia   . GERD (gastroesophageal reflux disease)   . Chest pain     Coronaries normal 1996 /  nuclear..06/2002..normal...EF  56% /  stress echo.. May, 2011.... no  scar or ischemia... rate related RBBB  . RBBB (right bundle branch block)     rate related  . Hx of colonoscopy   . Ejection fraction     EF 60%, stress echo, May, 2011  . Carotid artery disease ([facility])     Carotid Doppler normal August, 2007  . Tremor     Hand tremor  . Diverticulosis   . Shingles   .  IFG (impaired fasting glucose)   . Allergic rhinitis   . Hx of colonic polyps     adenomatous  . Asthma   . Anal fissure   . Arthritis   . Allergy   . Anxiety     from chronic pain from surgery- on Cymbalta  . Cataract   . Hyperlipidemia   . HTN (hypertension)     takes Metoprolol for PVC control  . TIA (transient ischemic attack) 02/2015  . Headache    Past Surgical History  Procedure Laterality Date  . Back surgery  2002,2009    x 6  . Neck surgery  2002  . Hernia repair      laprascopic  . Rotator cuff repair Left   . Cholecystectomy  11/30/2012    with IOC  . Upper gastrointestinal endoscopy    . Colonoscopy    . Tee without cardioversion N/A 04/04/2015    Procedure: TRANSESOPHAGEAL ECHOCARDIOGRAM (TEE);  Surgeon: Lelon Perla, MD;  Location: [facility];  Service: Cardiovascular;  Laterality: N/A;  . Ep implantable device N/A 04/04/2015    Procedure: Loop Recorder Insertion;  Surgeon: Spruha Weight Meredith Leeds, MD;  Location: [facility];  Service: Cardiovascular;  Laterality: N/A;     Current  Outpatient Prescriptions  Medication Sig Dispense Refill  . albuterol (PROVENTIL HFA;VENTOLIN HFA) 108 (90 BASE) MCG/ACT inhaler Inhale 2 puffs into the lungs as needed for wheezing.    . clopidogrel (PLAVIX) 75 MG tablet Take 1 tablet (75 mg total) by mouth daily. 30 tablet 2  . Cyanocobalamin (RA VITAMIN B-12 TR) 1000 MCG TBCR Take 1,000 mg by mouth daily.    [name] Calcium (STOOL SOFTENER PO) Take 1 capsule by mouth 2 (two) times daily as needed (for constipation).     . DULoxetine (CYMBALTA) 60 MG capsule Take 60 mg by mouth daily.    [name] DYMISTA 137-50 MCG/ACT SUSP Place 1 puff into both nostrils 2 (two) times daily.     . finasteride (PROSCAR) 5 MG tablet Take 5 mg by mouth Daily.    . fish oil-omega-3 fatty acids 1000 MG capsule Take 1 g by mouth daily. 1200 mg daily    . Fluticasone-Salmeterol (ADVAIR) 250-50 MCG/DOSE AEPB Inhale 1 puff into the lungs 2 (two)  times daily.    . lansoprazole (PREVACID) 30 MG capsule TAKE ONE CAPSULE BY MOUTH TWICE A DAY 180 capsule 1  . levocetirizine (XYZAL) 5 MG tablet Take 5 mg by mouth every evening.      . metoprolol succinate (TOPROL XL) 25 MG 24 hr tablet Take 1 tablet (25 mg total) by mouth daily. 30 tablet 12  . montelukast (SINGULAIR) 10 MG tablet Take 10 mg by mouth at bedtime.      [name] OVER THE COUNTER MEDICATION Place 1 drop into both eyes every morning. "Ro[place]o" Eye Drop    . primidone (MYSOLINE) 50 MG tablet Take 50 mg by mouth daily.  6  . rosuvastatin (CRESTOR) 10 MG tablet Take 5 mg by mouth daily. Take 1/2 tablet daily    . tamsulosin (FLOMAX) 0.4 MG CAPS capsule Take 0.4 mg by mouth daily after supper.     . traMADol (ULTRAM) 50 MG tablet Take 50 mg by mouth every 8 (eight) hours as needed. for pain  1  . zolpidem (AMBIEN) 10 MG tablet Take 10 mg by mouth at bedtime.     No current facility-administered medications for this visit.    Allergies:   Floxin   Social History:  The patient  reports that he has never smoked. He has never used smokeless tobacco. He reports that he does not drink alcohol or use illicit drugs.   Family History:  The patient's family history includes Breast cancer in his paternal grandmother and paternal grandmother; Clotting disorder in his father; Heart attack in his father and paternal grandfather; Heart disease in his brother and father; Hypertension in his mother. There is no history of Colon cancer, Esophageal cancer, Stomach cancer, or Rectal cancer.    ROS:  Please see the history of present illness.   Otherwise, review of systems is positive for sweats, fatigue, SOB with exertion and lying, chest pressure, palpitations, snoring, dizziness.   All other systems are reviewed and negative.    PHYSICAL EXAM: VS:  BP 120/70 mmHg  Pulse 64  Ht 6\' 1"  (1.854 m)  Wt 223 lb 3.2 oz (101.243 kg)  BMI 29.45 kg/m2 , BMI Body mass index is 29.45 kg/(m^2). GEN: Well nourished,  well developed, in no acute distress HEENT: normal Neck: no JVD, carotid bruits, or masses Cardiac: U Gilford Rile is a history today day 12 the patient called back again with another line RRR; no murmurs, rubs, or gallops,no edema  Respiratory:  clear to  auscultation bilaterally, normal work of breathing GI: soft, nontender, nondistended, + BS MS: no deformity or atrophy Skin: warm and dry,  device pocket is well healed Neuro:  Strength and sensation are intact Psych: euthymic mood, full affect  EKG:  EKG is not ordered today.   Device interrogation is reviewed today in detail.  See PaceArt for details.   Recent Labs: 03/05/2015: Hemoglobin 16.3 04/04/2015: ALT 17; BUN 19; Creatinine, Ser 1.02; Platelets 188; Potassium 5.4*; Sodium 143; TSH 1.240    Lipid Panel     Component Value Date/Time   CHOL 137 03/06/2015 0550   TRIG 105 03/06/2015 0550   HDL 41 03/06/2015 0550   CHOLHDL 3.3 03/06/2015 0550   VLDL 21 03/06/2015 0550   LDLCALC 75 03/06/2015 0550     Wt Readings from Last 3 Encounters:  06/19/15 223 lb 3.2 oz (101.243 kg)  04/04/15 222 lb 9.6 oz (100.971 kg)  04/04/15 216 lb (97.977 kg)      Other studies Reviewed: Additional studies/ records that were reviewed today include: TEE 04/04/15  Review of the above records today demonstrates:  - Left ventricle: Systolic function was normal. The estimated  ejection fraction was in the range of 50% to 55%. Wall motion was  normal; there were no regional wall motion abnormalities. - Aortic valve: No evidence of vegetation. There was trivial  regurgitation. - Mitral valve: No evidence of vegetation. There was mild  regurgitation. - Left atrium: The atrium was mildly dilated. No evidence of  thrombus in the atrial cavity or appendage. - Right atrium: No evidence of thrombus in the atrial cavity or  appendage. - Atrial septum: There was a patent foramen ovale. - Tricuspid valve: No evidence of vegetation. - Pulmonic  valve: No evidence of vegetation.   ASSESSMENT AND PLAN:  1.  PVCs: It is possible that his PVCs are causing the majority of his symptoms. Unfortunately, we do not have an accurate number of his PVCs per hour, but he is in trigeminy today and it is likely that he has quite a high burden. To further assess the burden of his PVCs, Nabor Thomann order a 48 hour monitor. His PVCs do appear to be coming from the outflow tracts based off of his EKG in February, and we Tiffanee Mcnee therefore potentially plan on ablation versus antiarrhythmic medications once his PVC burden has been further determined.    Current medicines are reviewed at length with the patient today.   The patient does not have concerns regarding his medicines.  The following changes were made today:  none  Labs/ tests ordered today include:  Orders Placed This Encounter  Procedures  . Holter monitor - 48 hour     Disposition:   FU with Fredric Slabach 1 months   Signed, Deno Sida Meredith Leeds, MD  06/19/2015 3:01 PM     [address] [place] [address] [phone] (office) [phone] (fax)

## 2015-06-19 ENCOUNTER — Encounter: Payer: Self-pay | Admitting: Cardiology

## 2015-06-19 ENCOUNTER — Ambulatory Visit (INDEPENDENT_AMBULATORY_CARE_PROVIDER_SITE_OTHER): Payer: Medicare HMO | Admitting: Cardiology

## 2015-06-19 VITALS — BP 120/70 | HR 64 | Ht 73.0 in | Wt 223.2 lb

## 2015-06-19 DIAGNOSIS — I493 Ventricular premature depolarization: Secondary | ICD-10-CM

## 2015-06-19 LAB — CUP PACEART INCLINIC DEVICE CHECK: Date Time Interrogation Session: 20170607154732

## 2015-06-19 NOTE — Patient Instructions (Signed)
Medication Instructions:  Your physician recommends that you continue on your current medications as directed. Please refer to the Current Medication list given to you today.  Labwork: None ordered  Testing/Procedures: Your physician has recommended that you wear a holter monitor. Holter monitors are medical devices that record the heart's electrical activity. Doctors most often use these monitors to diagnose arrhythmias. Arrhythmias are problems with the speed or rhythm of the heartbeat. The monitor is a small, portable device. You can wear one while you do your normal daily activities. This is usually used to diagnose what is causing palpitations/syncope (passing out).  Follow-Up: Your physician recommends that you schedule a follow-up appointment in: 1 month with Dr. Curt Bears.  If you need a refill on your cardiac medications before your next appointment, please call your pharmacy.  Thank you for choosing [facility]!!   Trinidad Curet, RN [phone]   Any Other Special Instructions Will Be Listed Below (If Applicable). Holter Monitoring A Holter monitor is a small device that is used to detect abnormal heart rhythms. It clips to your clothing and is connected by wires to flat, sticky disks (electrodes) that attach to your chest. It is worn continuously for 24-48 hours. HOME CARE INSTRUCTIONS  Wear your Holter monitor at all times, even while exercising and sleeping, for as long as directed by your health care provider.  Make sure that the Holter monitor is safely clipped to your clothing or close to your body as recommended by your health care provider.  Do not get the monitor or wires wet.  Do not put body lotion or moisturizer on your chest.  Keep your skin clean.  Keep a diary of your daily activities, such as walking and doing chores. If you feel that your heartbeat is abnormal or that your heart is fluttering or skipping a beat:  Record what you are doing when it  happens.  Record what time of day the symptoms occur.  Return your Holter monitor as directed by your health care provider.  Keep all follow-up visits as directed by your health care provider. This is important. SEEK IMMEDIATE MEDICAL CARE IF:  You feel lightheaded or you faint.  You have trouble breathing.  You feel pain in your chest, upper arm, or jaw.  You feel sick to your stomach and your skin is pale, cool, or damp.  You heartbeat feels unusual or abnormal.   This information is not intended to replace advice given to you by your health care provider. Make sure you discuss any questions you have with your health care provider.   Document Released: 09/27/2003 Document Revised: 01/19/2014 Document Reviewed: 08/07/2013 Elsevier Interactive Patient Education [employer].

## 2015-06-26 ENCOUNTER — Ambulatory Visit (INDEPENDENT_AMBULATORY_CARE_PROVIDER_SITE_OTHER): Payer: Medicare HMO

## 2015-06-26 DIAGNOSIS — I493 Ventricular premature depolarization: Secondary | ICD-10-CM | POA: Diagnosis not present

## 2015-07-03 ENCOUNTER — Ambulatory Visit (INDEPENDENT_AMBULATORY_CARE_PROVIDER_SITE_OTHER): Payer: Medicare HMO | Admitting: *Deleted

## 2015-07-03 DIAGNOSIS — I639 Cerebral infarction, unspecified: Secondary | ICD-10-CM

## 2015-07-03 NOTE — Progress Notes (Signed)
Carelink Summary Report / Loop Recorder 

## 2015-07-13 LAB — CUP PACEART REMOTE DEVICE CHECK
Date Time Interrogation Session: 20170522130631
Date Time Interrogation Session: 20170621133554

## 2015-07-29 NOTE — Progress Notes (Signed)
Electrophysiology Office Note   Date:  07/30/2015   ID:  Joshua [name], DOB 11-02-1945, MRN [id number]  PCP:  Marton Redwood, MD  Primary Electrophysiologist:  Will Meredith Leeds, MD    Chief Complaint  Patient presents with  . Pacemaker Check     History of Present Illness: Joshua [name] is a 69 y.o. male who presents today for electrophysiology evaluation.   He has a history of CVA, OSA, HLD, GERD, HTN, RBBB.  His stroke occurred 02/2015.  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  A linq monitor was implanted.  Since then it was noted that he had an excessive amount of PVCs on his monitor. He complains of shortness of breath, dyspnea on exertion, and orthopnea. His monitor showed that he was having 26% PVCs.  Today, he denies symptoms of palpitations, chest pain, lower extremity edema, claudication, dizziness, presyncope, syncope, bleeding, or neurologic sequela. The patient is tolerating medications without difficulties and is otherwise without complaint today.    Past Medical History  Diagnosis Date  . Vertigo   . Sleep apnea     pt denies  . Palpitations     Benign PVCs  . Dyslipidemia   . GERD (gastroesophageal reflux disease)   . Chest pain     Coronaries normal 1996 /  nuclear..06/2002..normal...EF  56% /  stress echo.. May, 2011.... no  scar or ischemia... rate related RBBB  . RBBB (right bundle branch block)     rate related  . Hx of colonoscopy   . Ejection fraction     EF 60%, stress echo, May, 2011  . Carotid artery disease ([place])     Carotid Doppler normal August, 2007  . Tremor     Hand tremor  . Diverticulosis   . Shingles   . IFG (impaired fasting glucose)   . Allergic rhinitis   . Hx of colonic polyps     adenomatous  . Asthma   . Anal fissure   . Arthritis   . Allergy   . Anxiety     from chronic pain from surgery- on Cymbalta  . Cataract   . Hyperlipidemia   . HTN  (hypertension)     takes Metoprolol for PVC control  . TIA (transient ischemic attack) 02/2015  . Headache    Past Surgical History  Procedure Laterality Date  . Back surgery  2002,2009    x 6  . Neck surgery  2002  . Hernia repair      laprascopic  . Rotator cuff repair Left   . Cholecystectomy  11/30/2012    with IOC  . Upper gastrointestinal endoscopy    . Colonoscopy    . Tee without cardioversion N/A 04/04/2015    Procedure: TRANSESOPHAGEAL ECHOCARDIOGRAM (TEE);  Surgeon: Lelon Perla, MD;  Location: [facility];  Service: Cardiovascular;  Laterality: N/A;  . Ep implantable device N/A 04/04/2015    Procedure: Loop Recorder Insertion;  Surgeon: Will Meredith Leeds, MD;  Location: [facility];  Service: Cardiovascular;  Laterality: N/A;     Current Outpatient Prescriptions  Medication Sig Dispense Refill  . albuterol (PROVENTIL HFA;VENTOLIN HFA) 108 (90 BASE) MCG/ACT inhaler Inhale 2 puffs into the lungs as needed for wheezing.    . clopidogrel (PLAVIX) 75 MG tablet Take 1 tablet (75 mg total) by mouth daily. 30 tablet 2  . Cyanocobalamin (RA VITAMIN B-12 TR) 1000  MCG TBCR Take 1,000 mg by mouth daily.    [name] Calcium (STOOL SOFTENER PO) Take 1 capsule by mouth 2 (two) times daily as needed (for constipation).     . DULoxetine (CYMBALTA) 60 MG capsule Take 60 mg by mouth daily.    [name] DYMISTA 137-50 MCG/ACT SUSP Place 1 puff into both nostrils 2 (two) times daily.     . finasteride (PROSCAR) 5 MG tablet Take 5 mg by mouth Daily.    . fish oil-omega-3 fatty acids 1000 MG capsule Take 1 g by mouth daily. 1200 mg daily    . Fluticasone-Salmeterol (ADVAIR) 250-50 MCG/DOSE AEPB Inhale 1 puff into the lungs 2 (two) times daily.    . lansoprazole (PREVACID) 30 MG capsule TAKE ONE CAPSULE BY MOUTH TWICE A DAY 180 capsule 1  . levocetirizine (XYZAL) 5 MG tablet Take 5 mg by mouth every evening.      . metoprolol succinate (TOPROL XL) 25 MG 24 hr tablet Take 1 tablet (25 mg  total) by mouth daily. 30 tablet 12  . montelukast (SINGULAIR) 10 MG tablet Take 10 mg by mouth at bedtime.      [name] OVER THE COUNTER MEDICATION Place 1 drop into both eyes every morning. "Rohto" Eye Drop    . primidone (MYSOLINE) 50 MG tablet Take 50 mg by mouth daily.  6  . rosuvastatin (CRESTOR) 10 MG tablet Take 5 mg by mouth daily. Take 1/2 tablet daily    . tamsulosin (FLOMAX) 0.4 MG CAPS capsule Take 0.4 mg by mouth daily after supper.     . zolpidem (AMBIEN) 10 MG tablet Take 10 mg by mouth at bedtime.     No current facility-administered medications for this visit.    Allergies:   Floxin   Social History:  The patient  reports that he has never smoked. He has never used smokeless tobacco. He reports that he does not drink alcohol or use illicit drugs.   Family History:  The patient's family history includes Breast cancer in his paternal grandmother and paternal grandmother; Clotting disorder in his father; Heart attack in his father and paternal grandfather; Heart disease in his brother and father; Hypertension in his mother. There is no history of Colon cancer, Esophageal cancer, Stomach cancer, or Rectal cancer.    ROS:  Please see the history of present illness.   Otherwise, review of systems is positive for palpitations, DOE, SOB at night, back pain.   All other systems are reviewed and negative.    PHYSICAL EXAM: VS:  BP 128/88 mmHg  Pulse 68  Ht 6\' 1"  (1.854 m)  Wt 223 lb 6.4 oz (101.334 kg)  BMI 29.48 kg/m2 , BMI Body mass index is 29.48 kg/(m^2). GEN: Well nourished, well developed, in no acute distress HEENT: normal Neck: no JVD, carotid bruits, or masses Cardiac: RRR; no murmurs, rubs, or gallops,no edema  Respiratory:  clear to auscultation bilaterally, normal work of breathing GI: soft, nontender, nondistended, + BS MS: no deformity or atrophy Skin: warm and dry,  device pocket is well healed Neuro:  Strength and sensation are intact Psych: euthymic mood, full  affect  EKG:  EKG is not ordered today.   Device interrogation is reviewed today in detail.  See PaceArt for details.   Recent Labs: 03/05/2015: Hemoglobin 16.3 04/04/2015: ALT 17; BUN 19; Creatinine, Ser 1.02; Platelets 188; Potassium 5.4*; Sodium 143; TSH 1.240    Lipid Panel     Component Value Date/Time   CHOL 137  03/06/2015 0550   TRIG 105 03/06/2015 0550   HDL 41 03/06/2015 0550   CHOLHDL 3.3 03/06/2015 0550   VLDL 21 03/06/2015 0550   LDLCALC 75 03/06/2015 0550     Wt Readings from Last 3 Encounters:  07/30/15 223 lb 6.4 oz (101.334 kg)  06/19/15 223 lb 3.2 oz (101.243 kg)  04/04/15 222 lb 9.6 oz (100.971 kg)      Other studies Reviewed: Additional studies/ records that were reviewed today include: TEE 04/04/15  Review of the above records today demonstrates:  - Left ventricle: Systolic function was normal. The estimated  ejection fraction was in the range of 50% to 55%. Wall motion was  normal; there were no regional wall motion abnormalities. - Aortic valve: No evidence of vegetation. There was trivial  regurgitation. - Mitral valve: No evidence of vegetation. There was mild  regurgitation. - Left atrium: The atrium was mildly dilated. No evidence of  thrombus in the atrial cavity or appendage. - Right atrium: No evidence of thrombus in the atrial cavity or  appendage. - Atrial septum: There was a patent foramen ovale. - Tricuspid valve: No evidence of vegetation. - Pulmonic valve: No evidence of vegetation.  Holter 06/26/15 Average HR: 88 bpm Minimum HR: 56 bpm Maximum HR: 128 bpm  26% PVCs  ASSESSMENT AND PLAN:  1.  PVCs: It is possible that his PVCs are causing the majority of his symptoms.  I discussed with him the options of ablation versus medical management with antiarrhythmics. He says that he would prefer to have an ablation done. We discussed the risks and benefits of ablation. Risks include bleeding, tamponade, heart block, and stroke.  He understands these risks and agrees to have the procedure done.   Current medicines are reviewed at length with the patient today.   The patient does not have concerns regarding his medicines.  The following changes were made today:  none  Labs/ tests ordered today include:  No orders of the defined types were placed in this encounter.     Disposition:   FU with Will Camnitz 1 months   Signed, Will Meredith Leeds, MD  07/30/2015 11:22 AM     [facility] [address] [place] [address] [phone] (office) [phone] (fax)

## 2015-07-30 ENCOUNTER — Ambulatory Visit (INDEPENDENT_AMBULATORY_CARE_PROVIDER_SITE_OTHER): Payer: Medicare HMO | Admitting: Cardiology

## 2015-07-30 ENCOUNTER — Encounter: Payer: Self-pay | Admitting: Cardiology

## 2015-07-30 VITALS — BP 128/88 | HR 68 | Ht 73.0 in | Wt 223.4 lb

## 2015-07-30 DIAGNOSIS — I493 Ventricular premature depolarization: Secondary | ICD-10-CM

## 2015-07-30 NOTE — Patient Instructions (Addendum)
Medication Instructions:  Your physician recommends that you continue on your current medications as directed. Please refer to the Current Medication list given to you today.  --- If you need a refill on your cardiac medications before your next appointment, please call your pharmacy. ---  Labwork: None ordered  Testing/Procedures: Your physician has recommended that you have a PVC ablation. Catheter ablation is a medical procedure used to treat some cardiac arrhythmias (irregular heartbeats). During catheter ablation, a long, thin, flexible tube is put into a blood vessel in your groin (upper thigh), or neck. This tube is called an ablation catheter. It is then guided to your heart through the blood vessel. Radio frequency waves destroy small areas of heart tissue where abnormal heartbeats may cause an arrhythmia to start.   Please call Trinidad Curet, RN when you are ready to schedule this procedure.  Available dates (these are subject to change): 8/4, 8/8, 8/24, 9/8, 9/14, 9/21  Follow-Up: To be determined once ablation is scheduled.  Thank you for choosing [facility]!!   Trinidad Curet, RN [phone]   Any Other Special Instructions Will Be Listed Below (If Applicable).  Cardiac Ablation Cardiac ablation is a procedure to disable a small amount of heart tissue in very specific places. The heart has many electrical connections. Sometimes these connections are abnormal and can cause the heart to beat very fast or irregularly. By disabling some of the problem areas, heart rhythm can be improved or made normal. Ablation is done for people who:   Have Wolff-Parkinson-White syndrome.   Have other fast heart rhythms (tachycardia).   Have taken medicines for an abnormal heart rhythm (arrhythmia) that resulted in:   No success.   Side effects.   May have a high-risk heartbeat that could result in death.  LET [facility] CARE PROVIDER KNOW ABOUT:   Any allergies you have  or any previous reactions you have had to X-ray dye, food (such as seafood), medicine, or tape.   All medicines you are taking, including vitamins, herbs, eye drops, creams, and over-the-counter medicines.   Previous problems you or members of your family have had with the use of anesthetics.   Any blood disorders you have.   Previous surgeries or procedures (such as a kidney transplant) you have had.   Medical conditions you have (such as kidney failure).  RISKS AND COMPLICATIONS Generally, cardiac ablation is a safe procedure. However, problems can occur and include:   Increased risk of cancer. Depending on how long it takes to do the ablation, the dose of radiation can be high.  Bruising and bleeding where a thin, flexible tube (catheter) was inserted during the procedure.   Bleeding into the chest, especially into the sac that surrounds the heart (serious).  Need for a permanent pacemaker if the normal electrical system is damaged.   The procedure may not be fully effective, and this may not be recognized for months. Repeat ablation procedures are sometimes required. BEFORE THE PROCEDURE   Follow any instructions from your health care provider regarding eating and drinking before the procedure.   Take your medicines as directed at regular times with water, unless instructed otherwise by your health care provider. If you are taking diabetes medicine, including insulin, ask how you are to take it and if there are any special instructions you should follow. It is common to adjust insulin dosing the day of the ablation.  PROCEDURE  An ablation is usually performed in a catheterization laboratory with the  guidance of fluoroscopy. Fluoroscopy is a type of X-ray that helps your health care provider see images of your heart during the procedure.   An ablation is a minimally invasive procedure. This means a small cut (incision) is made in either your neck or groin. Your health  care provider will decide where to make the incision based on your medical history and physical exam.  An IV tube will be started before the procedure begins. You will be given an anesthetic or medicine to help you relax (sedative).  The skin on your neck or groin will be numbed. A needle will be inserted into a large vein in your neck or groin and catheters will be threaded to your heart.  A special dye that shows up on fluoroscopy pictures may be injected through the catheter. The dye helps your health care provider see the area of the heart that needs treatment.  The catheter has electrodes on the tip. When the area of heart tissue that is causing the arrhythmia is found, the catheter tip will send an electrical current to the area and "scar" the tissue. Three types of energy can be used to ablate the heart tissue:   Heat (radiofrequency energy).   Laser energy.   Extreme cold (cryoablation).   When the area of the heart has been ablated, the catheter will be taken out. Pressure will be held on the insertion site. This will help the insertion site clot and keep it from bleeding. A bandage will be placed on the insertion site.  AFTER THE PROCEDURE   After the procedure, you will be taken to a recovery area where your vital signs (blood pressure, heart rate, and breathing) will be monitored. The insertion site will also be monitored for bleeding.   You will need to lie still for 4-6 hours. This is to ensure you do not bleed from the catheter insertion site.    This information is not intended to replace advice given to you by your health care provider. Make sure you discuss any questions you have with your health care provider.   Document Released: 05/17/2008 Document Revised: 01/19/2014 Document Reviewed: 05/23/2012 Elsevier Interactive Patient Education [employer].

## 2015-08-01 DIAGNOSIS — M170 Bilateral primary osteoarthritis of knee: Secondary | ICD-10-CM | POA: Diagnosis not present

## 2015-08-02 ENCOUNTER — Ambulatory Visit (INDEPENDENT_AMBULATORY_CARE_PROVIDER_SITE_OTHER): Payer: Medicare HMO | Admitting: *Deleted

## 2015-08-02 DIAGNOSIS — I639 Cerebral infarction, unspecified: Secondary | ICD-10-CM

## 2015-08-02 NOTE — Progress Notes (Signed)
Carelink Summary Report / Loop Recorder 

## 2015-08-19 ENCOUNTER — Telehealth: Payer: Self-pay | Admitting: Cardiology

## 2015-08-19 NOTE — Telephone Encounter (Signed)
I spoke with pt and told him I would send message to [facility] and she would contact him with instructions for procedure. Pt aware Venida Jarvis is out of office this week. I told him I was not able to locate any information from [facility] regarding an endoscopy.  Pt states he may wait until after ablation to have endoscopy but he will contact [facility] and have them send information to [place].

## 2015-08-19 NOTE — Telephone Encounter (Signed)
New message   Sept 14 th is the date that pt has selected for [facility], he said that he is calling to give rn the date that he has selected.   Also pt wants to know if [facility] medical has contacted Dr.Camnitz on the medical clearance for his endoscopy

## 2015-08-20 DIAGNOSIS — S83242A Other tear of medial meniscus, current injury, left knee, initial encounter: Secondary | ICD-10-CM | POA: Diagnosis not present

## 2015-08-22 DIAGNOSIS — I493 Ventricular premature depolarization: Secondary | ICD-10-CM | POA: Diagnosis not present

## 2015-08-22 DIAGNOSIS — K589 Irritable bowel syndrome without diarrhea: Secondary | ICD-10-CM | POA: Diagnosis not present

## 2015-08-22 LAB — CUP PACEART REMOTE DEVICE CHECK: Date Time Interrogation Session: 20170721140521

## 2015-08-25 ENCOUNTER — Telehealth: Payer: Self-pay | Admitting: Internal Medicine

## 2015-08-25 ENCOUNTER — Encounter (HOSPITAL_COMMUNITY): Payer: Self-pay | Admitting: Emergency Medicine

## 2015-08-25 ENCOUNTER — Emergency Department (HOSPITAL_COMMUNITY)
Admission: EM | Admit: 2015-08-25 | Discharge: 2015-08-25 | Disposition: A | Discharge status: Home / Self Care | Payer: Medicare HMO | Attending: Emergency Medicine | Admitting: Emergency Medicine

## 2015-08-25 DIAGNOSIS — K922 Gastrointestinal hemorrhage, unspecified: Secondary | ICD-10-CM | POA: Diagnosis not present

## 2015-08-25 DIAGNOSIS — I10 Essential (primary) hypertension: Secondary | ICD-10-CM | POA: Insufficient documentation

## 2015-08-25 DIAGNOSIS — Z8673 Personal history of transient ischemic attack (TIA), and cerebral infarction without residual deficits: Secondary | ICD-10-CM | POA: Diagnosis not present

## 2015-08-25 DIAGNOSIS — Z79899 Other long term (current) drug therapy: Secondary | ICD-10-CM | POA: Insufficient documentation

## 2015-08-25 DIAGNOSIS — K625 Hemorrhage of anus and rectum: Secondary | ICD-10-CM | POA: Diagnosis not present

## 2015-08-25 DIAGNOSIS — K921 Melena: Secondary | ICD-10-CM | POA: Diagnosis not present

## 2015-08-25 DIAGNOSIS — J45909 Unspecified asthma, uncomplicated: Secondary | ICD-10-CM | POA: Insufficient documentation

## 2015-08-25 DIAGNOSIS — R000 Tachycardia, unspecified: Secondary | ICD-10-CM | POA: Diagnosis not present

## 2015-08-25 DIAGNOSIS — R001 Bradycardia, unspecified: Secondary | ICD-10-CM | POA: Insufficient documentation

## 2015-08-25 LAB — COMPREHENSIVE METABOLIC PANEL
ALT: 22 U/L (ref 17–63)
AST: 23 U/L (ref 15–41)
Albumin: 4.2 g/dL (ref 3.5–5.0)
Alkaline Phosphatase: 52 U/L (ref 38–126)
Anion gap: 7 (ref 5–15)
BUN: 14 mg/dL (ref 6–20)
CO2: 22 mmol/L (ref 22–32)
Calcium: 9.3 mg/dL (ref 8.9–10.3)
Chloride: 108 mmol/L (ref 101–111)
Creatinine, Ser: 0.87 mg/dL (ref 0.61–1.24)
GFR calc Af Amer: >60 mL/min (ref >60)
GFR calc non Af Amer: >60 mL/min (ref >60)
Glucose, Bld: 105 mg/dL — ABNORMAL HIGH (ref 65–99)
Potassium: 3.9 mmol/L (ref 3.5–5.1)
Sodium: 137 mmol/L (ref 135–145)
Total Bilirubin: 1.1 mg/dL (ref 0.3–1.2)
Total Protein: 6.6 g/dL (ref 6.5–8.1)

## 2015-08-25 LAB — CBC
HCT: 43.4 % (ref 39.0–52.0)
Hemoglobin: 14.3 g/dL (ref 13.0–17.0)
MCH: 30.5 pg (ref 26.0–34.0)
MCHC: 32.9 g/dL (ref 30.0–36.0)
MCV: 92.5 fL (ref 78.0–100.0)
Platelets: 177 10*3/uL (ref 150–400)
RBC: 4.69 MIL/uL (ref 4.22–5.81)
RDW: 13.3 % (ref 11.5–15.5)
WBC: 6.8 10*3/uL (ref 4.0–10.5)

## 2015-08-25 LAB — TYPE AND SCREEN
ABO/RH(D): O POS
Antibody Screen: NEGATIVE

## 2015-08-25 LAB — ABO/RH: ABO/RH(D): O POS

## 2015-08-25 NOTE — ED Provider Notes (Signed)
[facility] DEPT Provider Note   CSN: [id number] Arrival date & time: 08/25/15  1114  First Provider Contact:  First MD Initiated Contact with Patient 08/25/15 1125        History   Chief Complaint Chief Complaint  Patient presents with  . Rectal Bleeding  . Irregular Heart Beat    HPI Joshua [name] is a 70 y.o. male.  The history is provided by the patient. No language interpreter was used.  Rectal Bleeding   Joshua [name] is a 70 y.o. male who presents to the Emergency Department complaining of hematochezia.  He was having problems with constipation and saw his PCP who prescribed linzess 3 days ago. Since beginning the medication 3 days ago his experience diarrhea. This morning he went to have a bowel movement and had a large amount of bright red blood per rectum. He reports dark blood in the commode as well as drops of bright red blood. He had so much blood that some of it sprayed in the wallet outside the toilet. He does have a mild left lower quadrant discomfort. No chest pain, shortness of breath, dizziness, fatigue. He has a history of stroke and takes Plavix. He also has a history of irregular heartbeat with frequent PVCs and has a loop recorder in place.  Past Medical History:  Diagnosis Date  . Allergic rhinitis   . Allergy   . Anal fissure   . Anxiety    from chronic pain from surgery- on Cymbalta  . Arthritis   . Asthma   . Carotid artery disease ([place])    Carotid Doppler normal August, 2007  . Cataract   . Chest pain    Coronaries normal 1996 /  nuclear..06/2002..normal...EF  56% /  stress echo.. May, 2011.... no  scar or ischemia... rate related RBBB  . Diverticulosis   . Dyslipidemia   . Ejection fraction    EF 60%, stress echo, May, 2011  . GERD (gastroesophageal reflux disease)   . Headache   . HTN (hypertension)    takes Metoprolol for PVC control  . Hx of colonic polyps    adenomatous  . Hx of colonoscopy   . Hyperlipidemia   . IFG (impaired  fasting glucose)   . Palpitations    Benign PVCs  . RBBB (right bundle branch block)    rate related  . Shingles   . Sleep apnea    pt denies  . TIA (transient ischemic attack) 02/2015  . Tremor    Hand tremor  . Vertigo     Patient Active Problem List   Diagnosis Date Noted  . Embolic stroke involving left middle cerebral artery ([place]) 04/07/2015  . Acute CVA (cerebrovascular accident) ([place]) 03/06/2015  . Stroke (cerebrum) ([place]) 03/05/2015  . CVA (cerebral infarction) 03/05/2015  . Weakness of right upper extremity   . Tremor   . Ejection fraction   . Vertigo   . Sleep apnea   . Palpitations   . Dyslipidemia   . GERD (gastroesophageal reflux disease)   . HTN (hypertension)   . Chest pain   . RBBB (right bundle branch block)   . Ejection fraction   . Carotid artery disease [facility])     Past Surgical History:  Procedure Laterality Date  . BACK SURGERY  2002,2009   x 6  . CHOLECYSTECTOMY  11/30/2012   with IOC  . COLONOSCOPY    . EP IMPLANTABLE DEVICE N/A 04/04/2015   Procedure: Loop Recorder Insertion;  Surgeon: Will Hassell Done  Camnitz, MD;  Location: [facility];  Service: Cardiovascular;  Laterality: N/A;  . HERNIA REPAIR     laprascopic  . NECK SURGERY  2002  . ROTATOR CUFF REPAIR Left   . TEE WITHOUT CARDIOVERSION N/A 04/04/2015   Procedure: TRANSESOPHAGEAL ECHOCARDIOGRAM (TEE);  Surgeon: Lelon Perla, MD;  Location: [facility] ENDOSCOPY;  Service: Cardiovascular;  Laterality: N/A;  . UPPER GASTROINTESTINAL ENDOSCOPY         Home Medications    Prior to Admission medications   Medication Sig Start Date End Date Taking? Authorizing Provider  albuterol (PROVENTIL HFA;VENTOLIN HFA) 108 (90 BASE) MCG/ACT inhaler Inhale 2 puffs into the lungs every 4 (four) hours as needed for wheezing.    Yes Historical Provider, MD  clopidogrel (PLAVIX) 75 MG tablet Take 1 tablet (75 mg total) by mouth daily. 03/07/15  Yes Oswald Hillock, MD  Cyanocobalamin (RA VITAMIN B-12 TR) 1000  MCG TBCR Take 1,000 mg by mouth daily.   Yes Historical Provider, MD  Docusate Calcium (STOOL SOFTENER PO) Take 1 capsule by mouth 2 (two) times daily as needed (for constipation).    Yes Historical Provider, MD  DULoxetine (CYMBALTA) 60 MG capsule Take 60 mg by mouth daily.   Yes Historical Provider, MD  DYMISTA 137-50 MCG/ACT SUSP Place 1 puff into both nostrils at bedtime.  03/24/11  Yes Historical Provider, MD  finasteride (PROSCAR) 5 MG tablet Take 5 mg by mouth Daily. 04/17/11  Yes Historical Provider, MD  fish oil-omega-3 fatty acids 1000 MG capsule Take 1 g by mouth daily. 1200 mg daily   Yes Historical Provider, MD  lansoprazole (PREVACID) 30 MG capsule TAKE ONE CAPSULE BY MOUTH TWICE A DAY Patient taking differently: TAKE 30 MG BY MOUTH TWICE A DAY 06/03/15  Yes Irene Shipper, MD  levocetirizine (XYZAL) 5 MG tablet Take 5 mg by mouth every evening.     Yes Historical Provider, MD  metoprolol succinate (TOPROL XL) 25 MG 24 hr tablet Take 1 tablet (25 mg total) by mouth daily. 04/09/15  Yes Melvenia Beam, MD  montelukast (SINGULAIR) 10 MG tablet Take 10 mg by mouth at bedtime.     Yes Historical Provider, MD  primidone (MYSOLINE) 50 MG tablet Take 50 mg by mouth daily. 06/07/15  Yes Historical Provider, MD  rosuvastatin (CRESTOR) 10 MG tablet Take 5 mg by mouth daily. Take 1/2 tablet daily   Yes Historical Provider, MD  tamsulosin (FLOMAX) 0.4 MG CAPS capsule Take 0.4 mg by mouth daily after supper.  05/25/13  Yes Historical Provider, MD  traMADol-acetaminophen (ULTRACET) 37.5-325 MG tablet Take 0.5 tablets by mouth at bedtime as needed for moderate pain.  08/13/15  Yes Historical Provider, MD  zolpidem (AMBIEN) 10 MG tablet Take 10 mg by mouth at bedtime. 05/13/11  Yes Historical Provider, MD    Family History Family History  Problem Relation Age of Onset  . Heart attack Father   . Heart disease Father   . Clotting disorder Father   . Heart disease Brother   . Hypertension Mother   . Breast  cancer Paternal Grandmother   . Heart attack Paternal Grandfather   . Colon cancer Neg Hx   . Esophageal cancer Neg Hx   . Stomach cancer Neg Hx   . Rectal cancer Neg Hx     Social History Social History  Substance Use Topics  . Smoking status: Never Smoker  . Smokeless tobacco: Never Used  . Alcohol use No  Allergies   Floxin [ofloxacin]   Review of Systems Review of Systems  Gastrointestinal: Positive for hematochezia.  All other systems reviewed and are negative.    Physical Exam Updated Vital Signs BP 128/86   Pulse 69   Temp 98.4 F (36.9 C)   Resp 16   Ht 6\' 1"  (1.854 m)   Wt 213 lb (96.6 kg)   SpO2 98%   BMI 28.10 kg/m   Physical Exam  Constitutional: He is oriented to person, place, and time. He appears well-developed and well-nourished.  HENT:  Head: Normocephalic and atraumatic.  Cardiovascular: Regular rhythm.   No murmur heard. Bradycardic.  Pulmonary/Chest: Effort normal and breath sounds normal. No respiratory distress.  Abdominal: Soft. There is no tenderness. There is no rebound and no guarding.  Genitourinary:  Genitourinary Comments: Multiple external hemorrhoids with no active external bleeding. Small amount of gross blood on rectal exam. Nontender rectal exam  Musculoskeletal: He exhibits no edema or tenderness.  Neurological: He is alert and oriented to person, place, and time.  Skin: Skin is warm and dry.  Psychiatric: He has a normal mood and affect. His behavior is normal.  Nursing note and vitals reviewed.    ED Treatments / Results  Labs (all labs ordered are listed, but only abnormal results are displayed) Labs Reviewed  COMPREHENSIVE METABOLIC PANEL - Abnormal; Notable for the following:       Result Value   Glucose, Bld 105 (*)    All other components within normal limits  CBC  TYPE AND SCREEN  ABO/RH    EKG  EKG Interpretation  Date/Time:  Sunday August 25 2015 11:22:21 EDT Ventricular Rate:  104 PR  Interval:    QRS Duration: 116 QT Interval:  375 QTC Calculation: 359 R Axis:   25 Text Interpretation:  Sinus bradycardia Ventricular bigeminy Left atrial enlargement Incomplete right bundle branch block Confirmed by Hazle Coca [phone]) on 08/25/2015 11:36:24 AM       Radiology No results found.  Procedures Procedures (including critical care time)  Medications Ordered in ED Medications - No data to display   Initial Impression / Assessment and Plan / ED Course  I have reviewed the triage vital signs and the nursing notes.  Pertinent labs & imaging results that were available during my care of the patient were reviewed by me and considered in my medical decision making (see chart for details).  Clinical Course    Patient here for evaluation following 1 episode of bright red blood per rectum. He does have a small amount of blood on rectal examination with external hemorrhoids present. He is asymptomatic in the emergency department. He has a history of diverticulosis, internal and external hemorrhoids. He is on Plavix for history of TIA. Discussed with Dr. Carlean Purl with gastroenterology. Given that patient is currently asymptomatic with stable labs plan to DC home with very close return precautions if he has any recurrent or progressive symptoms. Discussed with patient discontinuing the Linzess therapy.  Final Clinical Impressions(s) / ED Diagnoses   Final diagnoses:  Hematochezia  Lower GI bleed    New Prescriptions Discharge Medication List as of 08/25/2015  2:31 PM       Quintella Reichert, MD 08/25/15 1645

## 2015-08-25 NOTE — Telephone Encounter (Signed)
IN ED after rectal bleeding x 1 - Hgb NL and stable [place] red blood in rectal vault Started Linzess 3 d ago and diarrhea since then Hx hemorrhoids - large on colonoscopy 2014  On clopidogrel for hx stroke VSS NAD  Discussed w/ Dr. Ralene Bathe and I think ok to go home   Hold clopidogrel and hold Linzess  We will contact patient tomorrow to f/u and get him an appt  If more bleeding call back vs go to ED

## 2015-08-25 NOTE — ED Triage Notes (Signed)
Per [facility], pt from home, called out for rectal bleeding the past couple days, pt placed on heart monitor, pt ekg shows bigeminy, pvcs are not profusing. Pt has loop recorder. Pt has afib. Pt c/o mild discomfort in lower abdomen. BP 134/91

## 2015-08-26 ENCOUNTER — Telehealth: Payer: Self-pay | Admitting: Internal Medicine

## 2015-08-26 NOTE — Telephone Encounter (Signed)
See alternate phone note  

## 2015-08-26 NOTE — Telephone Encounter (Signed)
Dr Henrene Pastor this pt was advised by Dr Carlean Purl to follow up this week with you or app.  Do you want me to double book you or put on app schedule.  He was seen in the ED for rectal bleeding on clopidogrel.   Initial Impression / Assessment and Plan / ED Course  I have reviewed the triage vital signs and the nursing notes.  Pertinent labs & imaging results that were available during my care of the patient were reviewed by me and considered in my medical decision making (see chart for details).  Clinical Course    Patient here for evaluation following 1 episode of bright red blood per rectum. He does have a small amount of blood on rectal examination with external hemorrhoids present. He is asymptomatic in the emergency department. He has a history of diverticulosis, internal and external hemorrhoids. He is on Plavix for history of TIA. Discussed with Dr. Carlean Purl with gastroenterology. Given that patient is currently asymptomatic with stable labs plan to DC home with very close return precautions if he has any recurrent or progressive symptoms. Discussed with patient discontinuing the Linzess therapy.  Please advise.

## 2015-08-26 NOTE — Telephone Encounter (Signed)
I am already overbooked. APP schedule please

## 2015-08-26 NOTE — Telephone Encounter (Signed)
The pt will be scheduled by Webb Laws  with APP per Dr Henrene Pastor.

## 2015-08-28 ENCOUNTER — Telehealth: Payer: Self-pay | Admitting: Cardiology

## 2015-08-28 NOTE — Telephone Encounter (Signed)
New Message:    Pt wants you to be aware of papers being faxed over from [facility] ad Dr Archie Endo office.

## 2015-08-28 NOTE — Telephone Encounter (Signed)
Pt is calling to make Dr. Macky Lower aware that 2 different forms have been sent here for clearance for procedures.  The first one is from Dr. Archie Endo office who needs to do a CT arteriogram for (L) knee problems and (2) request from [facility] requesting for him to have Endo/albation.  He has been diagnosed with Barrett's esoph and needs a follow up Endo and ablation therapy.  Due to his loop recorder they need clearance.  Gave him the fax number and advised for them to fax form with att: Dr. Curt Bears. Will forward to Dr. Curt Bears and Leonia Reader who will covering him tomorrow.

## 2015-08-30 NOTE — Telephone Encounter (Signed)
Spoke with [place] and informed her that the letter was received and will be faxed today. I also informed her that they will need to call the pt's neurologist to advise on the stoppage of plavix.

## 2015-08-30 NOTE — Telephone Encounter (Signed)
New Message  Joshua [name] from [facility] call requesting to speak with RN about a fax clearance that was sent to our office. Joshua [name] wanted to f/u to see if it was received. Please call back to discuss

## 2015-08-30 NOTE — Telephone Encounter (Signed)
PVC ablation is scheduled for 9/14 at 7:30.  His H&P is scheduled for 9/6 at 11am. CARTO aware.

## 2015-09-02 ENCOUNTER — Encounter: Payer: Self-pay | Admitting: *Deleted

## 2015-09-02 ENCOUNTER — Ambulatory Visit (INDEPENDENT_AMBULATORY_CARE_PROVIDER_SITE_OTHER): Payer: Medicare HMO | Admitting: *Deleted

## 2015-09-02 DIAGNOSIS — I639 Cerebral infarction, unspecified: Secondary | ICD-10-CM | POA: Diagnosis not present

## 2015-09-02 DIAGNOSIS — M25562 Pain in left knee: Secondary | ICD-10-CM | POA: Diagnosis not present

## 2015-09-02 NOTE — Progress Notes (Signed)
Faxed signed antocoag clearance from [facility], MD back to [employer]. Fax:[phone]. Received confirmation.

## 2015-09-02 NOTE — Progress Notes (Signed)
Carelink Summary Report / Loop Report 

## 2015-09-03 ENCOUNTER — Encounter: Payer: Self-pay | Admitting: *Deleted

## 2015-09-03 NOTE — Telephone Encounter (Signed)
Quickly reviewed procedure instructions.  Patient will come in on 9/6 for H&P and lab work -- he will get his letter of instructions then. Will make post ablation follow up that day also. Patient is agreeable.

## 2015-09-04 ENCOUNTER — Encounter: Payer: Self-pay | Admitting: Physician Assistant

## 2015-09-04 ENCOUNTER — Ambulatory Visit (INDEPENDENT_AMBULATORY_CARE_PROVIDER_SITE_OTHER): Payer: Medicare HMO | Admitting: Physician Assistant

## 2015-09-04 VITALS — BP 110/52 | HR 56 | Ht 73.0 in | Wt 218.0 lb

## 2015-09-04 DIAGNOSIS — R197 Diarrhea, unspecified: Secondary | ICD-10-CM | POA: Diagnosis not present

## 2015-09-04 DIAGNOSIS — Z86010 Personal history of colonic polyps: Secondary | ICD-10-CM

## 2015-09-04 DIAGNOSIS — R1032 Left lower quadrant pain: Secondary | ICD-10-CM | POA: Diagnosis not present

## 2015-09-04 DIAGNOSIS — K921 Melena: Secondary | ICD-10-CM

## 2015-09-04 DIAGNOSIS — R194 Change in bowel habit: Secondary | ICD-10-CM

## 2015-09-04 DIAGNOSIS — K22711 Barrett's esophagus with high grade dysplasia: Secondary | ICD-10-CM

## 2015-09-04 NOTE — Progress Notes (Signed)
Chief Complaint: Rectal Bleeding, LLQ abdominal pain  HPI:  [name] is a 70 y/o Caucasian male, who regularly follows with Dr. Henrene Pastor, with a past medical history significant for carotid artery disease, Barrett's esophagus, diverticulosis, GERD, hypertension, TIA on chronic anticoagulation with Plavix, frequent PVCs and hyperlipidemia,  who was referred to me by Dr. Marton Redwood, for a complaint of rectal bleeding and abdominal pain .    The patient was recently seen in the ER and 08/25/15 for a complaint of hematochezia. Patient described having constipation and being started on Linzess 3 days prior, since then he had been experiencing diarrhea and that morning had a bowel movement with a large amount of bright red blood per rectum. He had an accompanying left lower quadrant discomfort. At that time rectal exam revealed multiple external hemorrhoids with no active bleeding and a small amount of gross blood on rectal exam. CBC and CMP at that time normal. Patient was discharged and told to discontinue Linzess.  Independent review of patient's last EGD report and images from 03/29/14 by Dr. Henrene Pastor revealed Barrett's esophagus, fundic gland polyps and otherwise normal exam. Pathology revealed Barrett's esophagus with st least high-grade dysplasia. Patient was then referred to [facility] for his Barrett's esophagus. Patient did have follow-up EGD, last on 02/19/15 at [facility] with findings of a regular Z line, small nodular area at the GE junction which was treated with APC, 3 cm hiatal hernia, multiple fundic gland polyps and a normal examined duodenum. Patient was told he would need a repeat EGD in 6 months.  Independent review of patient's last colonoscopy report and images from 09/06/12 revealed diminutive polyps in the transverse colon, moderate diverticulosis in the left colon and otherwise normal colonoscopy. Pathology revealed tubular adenoma. Repeat was recommended in 5 years.  Today, the patient tells me that  he feels like his bowel movements have changed over the past year. He explains that he has been experiencing 3 bowel movements every morning which initially will start out solid and the end of them will be liquid. This is accompanied by a lot of left lower quadrant spasming and aching. This has been somewhat intermittent over the past year, but since the end of July of this has become almost constant. The patient describes that he would have these episodes of loose of bowel movements and then have 3-4 days of constipation. This is why his primary care provider started him on Linzess about 2 weeks ago. The patient notes after he started this medication and he had diarrhea for 3 days which was accompanied by constant left lower quadrant aching pain and on 08/25/15, as above, presented to the ER due to 1 large bloody stool. The patient denies any further bleeding since that time but has continued with loose stools in the morning at least 3 episodes and continued with a left lower quadrant aching pain which is somewhat worse before a bowel movement, but does continue afterwards. Patient does admit that he has added some different medications to his regimen over this time. He also seems to believe that these problems may have started after his last EGD with ablation in February at [facility]. Associated symptoms included decrease in appetite, fatigue and a weight loss of 6 pounds over the past month.  Patient's medical history is positive for being scheduled for an upcoming ablation on 09/26/15 for his PVCs. Patient's social history is positive for a trip he has planned to [place], he is supposed to leave for this Saturday  but currently feels "uncomfortable with his symptoms", and is nervous about doing this.  Patient denies fever, chills, change in diet, nausea, vomiting, heartburn, reflux, dysphagia, gas, bloating or symptoms that awaken him at night.  Past Medical History:  Diagnosis Date  . Allergic rhinitis   .  Allergy   . Anal fissure   . Anxiety    from chronic pain from surgery- on Cymbalta  . Arthritis   . Asthma   . Barrett's esophagus 03/29/2014  . Carotid artery disease ([place])    Carotid Doppler normal August, 2007  . Cataract   . Chest pain    Coronaries normal 1996 /  nuclear..06/2002..normal...EF  56% /  stress echo.. May, 2011.... no  scar or ischemia... rate related RBBB  . Diverticulosis   . Dyslipidemia   . Ejection fraction    EF 60%, stress echo, May, 2011  . GERD (gastroesophageal reflux disease)   . Headache   . HTN (hypertension)    takes Metoprolol for PVC control  . Hx of colonic polyps    adenomatous  . Hx of colonoscopy   . Hyperlipidemia   . IFG (impaired fasting glucose)   . Palpitations    Benign PVCs  . RBBB (right bundle branch block)    rate related  . Shingles   . Sleep apnea    pt denies  . TIA (transient ischemic attack) 02/2015  . Tremor    Hand tremor  . Vertigo     Past Surgical History:  Procedure Laterality Date  . BACK SURGERY  2002,2009   x 6  . CHOLECYSTECTOMY  11/30/2012   with IOC  . COLONOSCOPY    . EP IMPLANTABLE DEVICE N/A 04/04/2015   Procedure: Loop Recorder Insertion;  Surgeon: Will Meredith Leeds, MD;  Location: [facility];  Service: Cardiovascular;  Laterality: N/A;  . HERNIA REPAIR     laprascopic  . NECK SURGERY  2002  . ROTATOR CUFF REPAIR Left   . TEE WITHOUT CARDIOVERSION N/A 04/04/2015   Procedure: TRANSESOPHAGEAL ECHOCARDIOGRAM (TEE);  Surgeon: Lelon Perla, MD;  Location: [facility] ENDOSCOPY;  Service: Cardiovascular;  Laterality: N/A;  . UPPER GASTROINTESTINAL ENDOSCOPY      Current Outpatient Prescriptions  Medication Sig Dispense Refill  . albuterol (PROVENTIL HFA;VENTOLIN HFA) 108 (90 BASE) MCG/ACT inhaler Inhale 2 puffs into the lungs every 4 (four) hours as needed for wheezing.     . clopidogrel (PLAVIX) 75 MG tablet Take 1 tablet (75 mg total) by mouth daily. 30 tablet 2  . Cyanocobalamin (RA VITAMIN  B-12 TR) 1000 MCG TBCR Take 1,000 mg by mouth daily.    [name] Calcium (STOOL SOFTENER PO) Take 1 capsule by mouth 2 (two) times daily as needed (for constipation).     . DULoxetine (CYMBALTA) 60 MG capsule Take 60 mg by mouth daily.    [name] DYMISTA 137-50 MCG/ACT SUSP Place 1 puff into both nostrils at bedtime.     . finasteride (PROSCAR) 5 MG tablet Take 5 mg by mouth Daily.    . fish oil-omega-3 fatty acids 1000 MG capsule Take 1 g by mouth daily. 1200 mg daily    . lansoprazole (PREVACID) 30 MG capsule TAKE ONE CAPSULE BY MOUTH TWICE A DAY (Patient taking differently: TAKE 30 MG BY MOUTH TWICE A DAY) 180 capsule 1  . levocetirizine (XYZAL) 5 MG tablet Take 5 mg by mouth every evening.      . metoprolol succinate (TOPROL XL) 25 MG 24 hr  tablet Take 1 tablet (25 mg total) by mouth daily. 30 tablet 12  . montelukast (SINGULAIR) 10 MG tablet Take 10 mg by mouth at bedtime.      . primidone (MYSOLINE) 50 MG tablet Take 50 mg by mouth daily.  6  . rosuvastatin (CRESTOR) 10 MG tablet Take 5 mg by mouth daily. Take 1/2 tablet daily    . tamsulosin (FLOMAX) 0.4 MG CAPS capsule Take 0.4 mg by mouth daily after supper.     . traMADol-acetaminophen (ULTRACET) 37.5-325 MG tablet Take 0.5 tablets by mouth at bedtime as needed for moderate pain.   0  . zolpidem (AMBIEN) 10 MG tablet Take 10 mg by mouth at bedtime.     No current facility-administered medications for this visit.     Allergies as of 09/04/2015 - Review Complete 08/25/2015  Allergen Reaction Noted  . Floxin [ofloxacin] Other (See Comments) 05/21/2010    Family History  Problem Relation Age of Onset  . Heart attack Father   . Heart disease Father   . Clotting disorder Father   . Heart disease Brother   . Hypertension Mother   . Breast cancer Paternal Grandmother   . Heart attack Paternal Grandfather   . Colon cancer Neg Hx   . Esophageal cancer Neg Hx   . Stomach cancer Neg Hx   . Rectal cancer Neg Hx     Social History    Social History  . Marital status: Married    Spouse name: [name]   . Number of children: 2  . Years of education: 15   Occupational History  . Retired    Social History Main Topics  . Smoking status: Never Smoker  . Smokeless tobacco: Never Used  . Alcohol use No  . Drug use: No  . Sexual activity: Not on file   Other Topics Concern  . Not on file   Social History Narrative   Lives with wife.    Caffeine use: Coffee/tea/soda- ocass    Review of Systems:    Constitutional: Positive for a 6 pound weight loss over the past month as well as fatigue No fever or chills HEENT: Eyes: No change in vision               Ears, Nose, Throat:  No change in hearing or congestion Skin: No rash or itching Cardiovascular: Positive for history of PVCs No chest pain, chest pressure or chest discomfort.  Respiratory: No SOB or cough Gastrointestinal: See HPI and otherwise negative Genitourinary: No dysuria or change in urinary frequency Neurological: No headache, dizziness or syncope Musculoskeletal: Positive for chronic atrophy indications left leg after back surgeries, he is wearing a brace No new muscle or joint pain Hematologic: Positive for 1 episode of hematochezia No anemia or bruising Psychiatric: No history of depression or anxiety   Physical Exam:  Vital signs: BP (!) 110/52   Pulse (!) 56   Ht 6\' 1"  (1.854 m)   Wt 218 lb (98.9 kg)   BMI 28.76 kg/m   General:   Pleasant Caucasian male appears to be in NAD, Well developed, Well nourished, alert and cooperative Head:  Normocephalic and atraumatic. Eyes:   PEERL, EOMI. No icterus. Conjunctiva pink. Ears:  Normal auditory acuity. Neck:  Supple Throat: Oral cavity and pharynx without inflammation, swelling or lesion. Teeth in good condition. Lungs: Respirations even and unlabored. Lungs clear to auscultation bilaterally.   No wheezes, crackles, or rhonchi.  Heart: Normal S1, S2. No MRG. Regular  rate and rhythm. No peripheral  edema, cyanosis or pallor.  Abdomen:  Soft, nondistended, mild TTP, worse in left lower quadrant No rebound or guarding. Normal bowel sounds. No appreciable masses or hepatomegaly. Rectal:  Not performed.  Msk:  Symmetrical without gross deformities. Peripheral pulses intact.  Extremities: Full leg brace present on left leg no edema, no deformity or joint abnormality. Normal ROM, normal sensation. Neurologic:  Alert and  oriented x4;  grossly normal neurologically.   Skin:   Dry and intact without significant lesions or rashes. Psychiatric: Oriented to person, place and time. Demonstrates good judgement and reason without abnormal affect or behaviors.  RELEVANT LABS AND IMAGING: CBC    Component Value Date/Time   WBC 6.8 08/25/2015 1140   RBC 4.69 08/25/2015 1140   HGB 14.3 08/25/2015 1140   HCT 43.4 08/25/2015 1140   HCT 41.8 04/04/2015 1501   PLT 177 08/25/2015 1140   PLT 188 04/04/2015 1501   MCV 92.5 08/25/2015 1140   MCV 92 04/04/2015 1501   MCH 30.5 08/25/2015 1140   MCHC 32.9 08/25/2015 1140   RDW 13.3 08/25/2015 1140   RDW 14.7 04/04/2015 1501   LYMPHSABS 2.3 03/05/2015 1027   MONOABS 0.8 03/05/2015 1027   EOSABS 0.2 03/05/2015 1027   BASOSABS 0.1 03/05/2015 1027    CMP     Component Value Date/Time   NA 137 08/25/2015 1140   NA 143 04/04/2015 1501   K 3.9 08/25/2015 1140   CL 108 08/25/2015 1140   CO2 22 08/25/2015 1140   GLUCOSE 105 (H) 08/25/2015 1140   BUN 14 08/25/2015 1140   BUN 19 04/04/2015 1501   CREATININE 0.87 08/25/2015 1140   CALCIUM 9.3 08/25/2015 1140   PROT 6.6 08/25/2015 1140   PROT 6.3 04/04/2015 1501   ALBUMIN 4.2 08/25/2015 1140   ALBUMIN 4.4 04/04/2015 1501   AST 23 08/25/2015 1140   ALT 22 08/25/2015 1140   ALKPHOS 52 08/25/2015 1140   BILITOT 1.1 08/25/2015 1140   BILITOT 0.7 04/04/2015 1501   GFRNONAA >60 08/25/2015 1140   GFRAA >60 08/25/2015 1140    Assessment: 1. Hematochezia: 1 episode of hematochezia 10 days ago, likely  this was related to proceeding constipation and diarrhea, hemoglobin remained stable, likely this represented hemorrhoidal or diverticular bleed which has since stopped 2. Change in bowel habits: Patient does does describe changed towards diarrhea over the past 6 months, worsened over the past week and a half with Linzess usage, consider IV S versus infectious versus diverticular cause 3. Diarrhea: See above 4. Left lower quadrant abdominal cramping: Patient describes left lower quadrant cramping over the past year which has worsened over the past few months, continuous over the past week and a half, history of abdominal cramping before bowel movements over the past year, consider IBS first diverticular disease 5. Barrett's esophagus: Patient does follow with [facility] regarding this, last EGD in February of this year, recommendations for repeat in 6 months, patient is now due plans to schedule seen 6. History of adenomatous polyps: Last colonoscopy in 2014 with recommendations for repeat in 5 years due to adenomatous polyp  Plan: 1. At this time discussed with the patient that he will need a colonoscopy in the near future due to his change in bowel habits and continued abdominal discomfort. Timing of this procedure will need to be discussed with Dr. Henrene Pastor as well as his cardiologist as the patient is scheduled for an upcoming ablation on 09/26/15 for PVCs. Patient's wife  also notes that they're due for repeat EGD at [facility] for follow-up of patient's Barrett's esophagus, there is question of whether or not they could have colonoscopy at the same time. Encouraged them to make this appointment. Did discuss with the patient that if symptoms increase or worsen we will need to proceed with more emergent colonoscopy/investigation. 2. When pt scheduled for procedures will also need to advise pt to hold Plavix for 5 days previous. We will communicate with his cardiologist to ensure tat holding Plavix is acceptable. 3.  Ordered stool studies including a GI pathogen panel as well as C. difficile. If these return normal/negative will start the patient on Bentyl 10 mg 3 times a day for left lower quadrant abdominal cramping as well as diarrhea.  4. Patient to follow in clinic in the next 3-4 weeks or sooner if necessary.  Ellouise Newer, PA-C [facility] 09/04/2015, 8:36 AM  Cc: Marton Redwood, MD

## 2015-09-04 NOTE — Progress Notes (Signed)
Agree with initial assessment and plans. As he is being assessed here for his lower GI complaints and would be followed thereafter for such, he should undergo colonoscopy here. Thanks

## 2015-09-04 NOTE — Patient Instructions (Addendum)
Please go to the basement level to have our lab for stool studies.              If you are age 69 or older, your body mass index should be between 23-30. Your Body mass index is 28.76 kg/m. If this is out of the aforementioned range listed, please consider follow up with your Primary Care Provider.

## 2015-09-05 ENCOUNTER — Telehealth: Payer: Self-pay | Admitting: Physician Assistant

## 2015-09-05 ENCOUNTER — Other Ambulatory Visit: Payer: Self-pay | Admitting: *Deleted

## 2015-09-06 ENCOUNTER — Telehealth: Payer: Self-pay

## 2015-09-06 ENCOUNTER — Other Ambulatory Visit: Payer: Medicare HMO

## 2015-09-06 DIAGNOSIS — R1032 Left lower quadrant pain: Secondary | ICD-10-CM

## 2015-09-06 DIAGNOSIS — R197 Diarrhea, unspecified: Secondary | ICD-10-CM

## 2015-09-06 DIAGNOSIS — K921 Melena: Secondary | ICD-10-CM | POA: Diagnosis not present

## 2015-09-06 DIAGNOSIS — R194 Change in bowel habit: Secondary | ICD-10-CM | POA: Diagnosis not present

## 2015-09-06 NOTE — Telephone Encounter (Signed)
Dr. Jaynee Eagles please advise if pt can hold his plavix for 5 days prior to his colonoscopy on 10/02/15.

## 2015-09-06 NOTE — Telephone Encounter (Signed)
Plavix was prescribed by neurology for his CVA.  Should discuss with them.  WC

## 2015-09-06 NOTE — Telephone Encounter (Signed)
-----   Message from Algernon Huxley, RN sent at 09/05/2015  4:33 PM EDT ----- Regarding: FW: colo   ----- Message ----- From: Levin Erp, PA Sent: 09/05/2015   8:42 AM To: Marlon Pel, RN Subject: colo                                           Can you please check status of this pt stool studies? Has he turned them in?  Also can you schedule him for colo with Dr. Henrene Pastor next available (according to pt and dr. Blanch Media schedule).  I did speak with his cardiologist who does not see a problem with scheduling this before upcoming ablation on 09/26/15 if necessary.   Thanks-JLL ----- Message ----- From: Irene Shipper, MD Sent: 09/04/2015   4:57 PM To: Levin Erp, PA Subject: RE: Colo/EGD?                                  Colonoscopy here as we are evaluating and will subsequently manage his lower GI issues. Thanks for checking ----- Message ----- From: Levin Erp, PA Sent: 09/04/2015   9:35 AM To: Irene Shipper, MD Subject: Colo/EGD?                                      I saw this pt of yours in clinic this morning, please see OV note-wife asked if they could possibly schedule colonoscopy with EGD at [facility] as pt is due for follow with them for h/o Barrett's.  Is this ok?  Or would you prefer pt to have colo here?  Thanks-JLL

## 2015-09-06 NOTE — Telephone Encounter (Signed)
-----   Message from Algernon Huxley, RN sent at 09/05/2015  4:33 PM EDT ----- Regarding: FW: colo   ----- Message ----- From: Levin Erp, PA Sent: 09/05/2015   8:42 AM To: Marlon Pel, RN Subject: colo                                           Can you please check status of this pt stool studies? Has he turned them in?  Also can you schedule him for colo with Dr. Henrene Pastor next available (according to pt and dr. Blanch Media schedule).  I did speak with his cardiologist who does not see a problem with scheduling this before upcoming ablation on 09/26/15 if necessary.   Thanks-JLL ----- Message ----- From: Irene Shipper, MD Sent: 09/04/2015   4:57 PM To: Levin Erp, PA Subject: RE: Colo/EGD?                                  Colonoscopy here as we are evaluating and will subsequently manage his lower GI issues. Thanks for checking ----- Message ----- From: Levin Erp, PA Sent: 09/04/2015   9:35 AM To: Irene Shipper, MD Subject: Colo/EGD?                                      I saw this pt of yours in clinic this morning, please see OV note-wife asked if they could possibly schedule colonoscopy with EGD at [facility] as pt is due for follow with them for h/o Barrett's.  Is this ok?  Or would you prefer pt to have colo here?  Thanks-JLL

## 2015-09-06 NOTE — Telephone Encounter (Signed)
Pt states he returned stool studies today. Pt scheduled for previsit 09/24/15@4 :30pm, colon scheduled in the [place] 10/02/15@3pm . Pt aware of appts. Letter sent to cardiology regarding holding plavix.

## 2015-09-06 NOTE — Telephone Encounter (Signed)
Pt has had some changes in his bowel habits and some blood in his stool. Plan for Dr. Henrene Pastor to do colonoscopy 10/02/15. Please advise if ok for pt to hold Plavix for 5 days prior to colonoscopy.

## 2015-09-07 LAB — CLOSTRIDIUM DIFFICILE BY PCR

## 2015-09-07 NOTE — Telephone Encounter (Signed)
Joshua [name], let patient know he can hold his plavix for procedure. However he just need to be aware that there is increased risk of stroke while off of his stroke-prevention medication. I would prefer while he is off of his plavix that he can at least stay on asa 81mg  until the physician performing the procedure says it is safe to go back onto plavix. He needs to make sure with the physician doing the colonoscopy that staying on  Baby aspirin for the colonoscopy is ok. thanks

## 2015-09-09 LAB — GASTROINTESTINAL PATHOGEN PANEL PCR
C. difficile Tox A/B, PCR: NOT DETECTED
Campylobacter, PCR: NOT DETECTED
Cryptosporidium, PCR: NOT DETECTED
E coli (ETEC) LT/ST PCR: NOT DETECTED
E coli (STEC) stx1/stx2, PCR: NOT DETECTED
E coli 0157, PCR: NOT DETECTED
Giardia lamblia, PCR: NOT DETECTED
Norovirus, PCR: NOT DETECTED
Rotavirus A, PCR: NOT DETECTED
Salmonella, PCR: NOT DETECTED
Shigella, PCR: NOT DETECTED

## 2015-09-09 NOTE — Telephone Encounter (Signed)
Spoke to patient - he is aware of Dr. Cathren Laine recommendations - verbalized understanding.

## 2015-09-09 NOTE — Telephone Encounter (Signed)
Noted  

## 2015-09-10 ENCOUNTER — Other Ambulatory Visit: Payer: Self-pay

## 2015-09-10 MED ORDER — DICYCLOMINE HCL 10 MG PO CAPS: 10.0000 mg | ORAL_CAPSULE | Freq: Three times a day (TID) | ORAL | 1 refills | Status: DC | PRN

## 2015-09-17 NOTE — Progress Notes (Signed)
Electrophysiology Office Note   Date:  09/18/2015   ID:  Joshua [name], DOB [birth year], MRN [id number]  PCP:  Marton Redwood, MD  Primary Electrophysiologist:  Will Meredith Leeds, MD    Chief Complaint  Patient presents with  . Pacemaker Check    PVC's     History of Present Illness: Joshua [name] is a 70 y.o. male who presents today for electrophysiology evaluation.   He has a history of CVA, OSA, HLD, GERD, HTN, RBBB.  His stroke occurred 02/2015.  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  A linq monitor was implanted.  Since then it was noted that he had an excessive amount of PVCs on his monitor. He complains of shortness of breath, dyspnea on exertion, and orthopnea. His monitor showed that he was having 26% PVCs.  Today, he denies symptoms of chest pain, lower extremity edema, claudication, dizziness, presyncope, syncope, bleeding, or neurologic sequela. The patient is tolerating medications without difficulties and is otherwise without complaint today. He does continue to have occasional palpitations.   Past Medical History:  Diagnosis Date  . Allergic rhinitis   . Allergy   . Anal fissure   . Anxiety    from chronic pain from surgery- on Cymbalta  . Arthritis   . Asthma   . Barrett's esophagus 03/29/2014  . Carotid artery disease ([place])    Carotid Doppler normal August, 2007  . Cataract   . Chest pain    Coronaries normal 1996 /  nuclear..06/2002..normal...EF  56% /  stress echo.. May, 2011.... no  scar or ischemia... rate related RBBB  . Diverticulosis   . Dyslipidemia   . Ejection fraction    EF 60%, stress echo, May, 2011  . GERD (gastroesophageal reflux disease)   . Headache   . HTN (hypertension)    takes Metoprolol for PVC control  . Hx of colonic polyps    adenomatous  . Hx of colonoscopy   . Hyperlipidemia   . IFG (impaired fasting glucose)   . Palpitations    Benign PVCs  .  RBBB (right bundle branch block)    rate related  . Shingles   . Sleep apnea    pt denies  . TIA (transient ischemic attack) 02/2015  . Tremor    Hand tremor  . Vertigo    Past Surgical History:  Procedure Laterality Date  . BACK SURGERY  2002,2009   x 6  . CHOLECYSTECTOMY  11/30/2012   with IOC  . COLONOSCOPY    . EP IMPLANTABLE DEVICE N/A 04/04/2015   Procedure: Loop Recorder Insertion;  Surgeon: Will Meredith Leeds, MD;  Location: [facility];  Service: Cardiovascular;  Laterality: N/A;  . HERNIA REPAIR     laprascopic  . NECK SURGERY  2002  . ROTATOR CUFF REPAIR Left   . TEE WITHOUT CARDIOVERSION N/A 04/04/2015   Procedure: TRANSESOPHAGEAL ECHOCARDIOGRAM (TEE);  Surgeon: Lelon Perla, MD;  Location: [facility] ENDOSCOPY;  Service: Cardiovascular;  Laterality: N/A;  . UPPER GASTROINTESTINAL ENDOSCOPY       Current Outpatient Prescriptions  Medication Sig Dispense Refill  . albuterol (PROVENTIL HFA;VENTOLIN HFA) 108 (90 BASE) MCG/ACT inhaler Inhale 2 puffs into the lungs every 4 (four) hours as needed for wheezing.     . clopidogrel (PLAVIX) 75 MG tablet Take 1 tablet (75 mg total) by mouth daily. 30 tablet 2  . dicyclomine (BENTYL)  10 MG capsule Take 1 capsule (10 mg total) by mouth 3 (three) times daily with meals as needed for spasms. 30 capsule 1  . Docusate Calcium (STOOL SOFTENER PO) Take 1 capsule by mouth 2 (two) times daily as needed (for constipation).     . DULoxetine (CYMBALTA) 60 MG capsule Take 60 mg by mouth daily.    [name] DYMISTA 137-50 MCG/ACT SUSP Place 1 puff into both nostrils at bedtime.     . finasteride (PROSCAR) 5 MG tablet Take 5 mg by mouth Daily.    . fish oil-omega-3 fatty acids 1000 MG capsule Take 1 g by mouth daily. 1200 mg daily    . lansoprazole (PREVACID) 30 MG capsule Take 30 mg by mouth 2 (two) times daily.    [name] levocetirizine (XYZAL) 5 MG tablet Take 5 mg by mouth every evening.      . metoprolol succinate (TOPROL XL) 25 MG 24 hr tablet  Take 1 tablet (25 mg total) by mouth daily. 30 tablet 12  . montelukast (SINGULAIR) 10 MG tablet Take 10 mg by mouth at bedtime.      . primidone (MYSOLINE) 50 MG tablet Take 50 mg by mouth daily.  6  . rosuvastatin (CRESTOR) 10 MG tablet Take 5 mg by mouth daily. Take 1/2 tablet daily    . tamsulosin (FLOMAX) 0.4 MG CAPS capsule Take 0.4 mg by mouth daily after supper.     . traMADol-acetaminophen (ULTRACET) 37.5-325 MG tablet Take 0.5 tablets by mouth at bedtime as needed for moderate pain.   0  . zolpidem (AMBIEN) 10 MG tablet Take 10 mg by mouth at bedtime.     No current facility-administered medications for this visit.     Allergies:   Floxin [ofloxacin]   Social History:  The patient  reports that he has never smoked. He has never used smokeless tobacco. He reports that he does not drink alcohol or use drugs.   Family History:  The patient's family history includes Breast cancer in his paternal grandmother; Clotting disorder in his father; Heart attack in his father and paternal grandfather; Heart disease in his brother and father; Hypertension in his mother.    ROS:  Please see the history of present illness.   Otherwise, review of systems is positive for waking up SOB, palpitations, back pain.   All other systems are reviewed and negative.    PHYSICAL EXAM: VS:  BP 112/72   Pulse 70   Ht 6\' 1"  (1.854 m)   Wt 219 lb 6.4 oz (99.5 kg)   BMI 28.95 kg/m  , BMI Body mass index is 28.95 kg/m. GEN: Well nourished, well developed, in no acute distress  HEENT: normal  Neck: no JVD, carotid bruits, or masses Cardiac: RRR; no murmurs, rubs, or gallops,no edema  Respiratory:  clear to auscultation bilaterally, normal work of breathing GI: soft, nontender, nondistended, + BS MS: no deformity or atrophy  Skin: warm and dry,  device pocket is well healed Neuro:  Strength and sensation are intact Psych: euthymic mood, full affect  EKG:  EKG is not ordered today.  Personal review of  the device interrogation is reviewed today in detail.  See PaceArt for details.   Recent Labs: 04/04/2015: TSH 1.240 08/25/2015: ALT 22; BUN 14; Creatinine, Ser 0.87; Hemoglobin 14.3; Platelets 177; Potassium 3.9; Sodium 137    Lipid Panel     Component Value Date/Time   CHOL 137 03/06/2015 0550   TRIG 105 03/06/2015 0550   HDL  41 03/06/2015 0550   CHOLHDL 3.3 03/06/2015 0550   VLDL 21 03/06/2015 0550   LDLCALC 75 03/06/2015 0550     Wt Readings from Last 3 Encounters:  09/18/15 219 lb 6.4 oz (99.5 kg)  09/04/15 218 lb (98.9 kg)  08/25/15 213 lb (96.6 kg)      Other studies Reviewed: Additional studies/ records that were reviewed today include: TEE 04/04/15  Review of the above records today demonstrates:  - Left ventricle: Systolic function was normal. The estimated  ejection fraction was in the range of 50% to 55%. Wall motion was  normal; there were no regional wall motion abnormalities. - Aortic valve: No evidence of vegetation. There was trivial  regurgitation. - Mitral valve: No evidence of vegetation. There was mild  regurgitation. - Left atrium: The atrium was mildly dilated. No evidence of  thrombus in the atrial cavity or appendage. - Right atrium: No evidence of thrombus in the atrial cavity or  appendage. - Atrial septum: There was a patent foramen ovale. - Tricuspid valve: No evidence of vegetation. - Pulmonic valve: No evidence of vegetation.  Holter 06/26/15 Average HR: 88 bpm Minimum HR: 56 bpm Maximum HR: 128 bpm  26% PVCs  ASSESSMENT AND PLAN:  1.  PVCs: ablation planned 09/26/15. We discussed the risks and benefits of ablation. Risks include bleeding, tamponade, heart block, and stroke. He understands these risks and agrees to have the procedure done. I have told him that if he is not having his PVCs ablation, that he may need to reschedule. He understands this as well. Of note, it appears that the PVCs are coming from the right ventricular  outflow tract.    Current medicines are reviewed at length with the patient today.   The patient does not have concerns regarding his medicines.  The following changes were made today:  none  Labs/ tests ordered today include:  No orders of the defined types were placed in this encounter.    Disposition:   FU with Will Camnitz 1 months   Signed, Will Meredith Leeds, MD  09/18/2015 11:20 AM     [facility] [address] [place] [place] [address] [phone] (office) [phone] (fax)

## 2015-09-18 ENCOUNTER — Encounter: Payer: Self-pay | Admitting: Cardiology

## 2015-09-18 ENCOUNTER — Ambulatory Visit (INDEPENDENT_AMBULATORY_CARE_PROVIDER_SITE_OTHER): Payer: Medicare HMO | Admitting: Cardiology

## 2015-09-18 VITALS — BP 112/72 | HR 70 | Ht 73.0 in | Wt 219.4 lb

## 2015-09-18 DIAGNOSIS — I493 Ventricular premature depolarization: Secondary | ICD-10-CM | POA: Diagnosis not present

## 2015-09-18 DIAGNOSIS — Z01812 Encounter for preprocedural laboratory examination: Secondary | ICD-10-CM

## 2015-09-18 LAB — CBC WITH DIFFERENTIAL/PLATELET
Basophils Absolute: 84 cells/uL (ref 0–200)
Basophils Relative: 1 %
Eosinophils Absolute: 84 cells/uL (ref 15–500)
Eosinophils Relative: 1 %
HCT: 42.1 % (ref 38.5–50.0)
Hemoglobin: 14.8 g/dL (ref 13.2–17.1)
Lymphocytes Relative: 24 %
Lymphs Abs: 2016 cells/uL (ref 850–3900)
MCH: 31.4 pg (ref 27.0–33.0)
MCHC: 35.2 g/dL (ref 32.0–36.0)
MCV: 89.4 fL (ref 80.0–100.0)
MPV: 9.6 fL (ref 7.5–12.5)
Monocytes Absolute: 840 cells/uL (ref 200–950)
Monocytes Relative: 10 %
Neutro Abs: 5376 cells/uL (ref 1500–7800)
Neutrophils Relative %: 64 %
Platelets: 200 10*3/uL (ref 140–400)
RBC: 4.71 MIL/uL (ref 4.20–5.80)
RDW: 13.6 % (ref 11.0–15.0)
WBC: 8.4 10*3/uL (ref 3.8–10.8)

## 2015-09-18 LAB — BASIC METABOLIC PANEL
BUN: 19 mg/dL (ref 7–25)
CO2: 22 mmol/L (ref 20–31)
Calcium: 9.4 mg/dL (ref 8.6–10.3)
Chloride: 105 mmol/L (ref 98–110)
Creat: 0.88 mg/dL (ref 0.70–1.25)
Glucose, Bld: 109 mg/dL — ABNORMAL HIGH (ref 65–99)
Potassium: 4.4 mmol/L (ref 3.5–5.3)
Sodium: 138 mmol/L (ref 135–146)

## 2015-09-18 LAB — CUP PACEART INCLINIC DEVICE CHECK: Date Time Interrogation Session: 20170906115146

## 2015-09-18 NOTE — Patient Instructions (Signed)
Medication Instructions:  Your physician recommends that you continue on your current medications as directed. Please refer to the Current Medication list given to you today.  Labwork: Pre procedure labs today: BMET & CBCD  Testing/Procedures: You are already scheduled for a PVC ablation on 9/14.  See instruction sheet for procedure.  Follow-Up: Your physician recommends that you schedule a follow-up appointment in: 4 weeks, after your ablation on 9/14, with Dr. Curt Bears.  Any Other Special Instructions Will Be Listed Below (If Applicable).   If you need a refill on your cardiac medications before your next appointment, please call your pharmacy.  Thank you for choosing [facility]!!   Trinidad Curet, RN [phone]

## 2015-09-24 ENCOUNTER — Telehealth: Payer: Self-pay | Admitting: Cardiology

## 2015-09-24 ENCOUNTER — Telehealth: Payer: Self-pay

## 2015-09-24 ENCOUNTER — Ambulatory Visit (AMBULATORY_SURGERY_CENTER): Payer: Self-pay

## 2015-09-24 VITALS — Ht 73.0 in | Wt 220.0 lb

## 2015-09-24 DIAGNOSIS — R194 Change in bowel habit: Secondary | ICD-10-CM

## 2015-09-24 DIAGNOSIS — R1032 Left lower quadrant pain: Secondary | ICD-10-CM

## 2015-09-24 MED ORDER — NA SULFATE-K SULFATE-MG SULF 17.5-3.13-1.6 GM/177ML PO SOLN: ORAL | 0 refills | Status: DC

## 2015-09-24 NOTE — Progress Notes (Signed)
Per pt, no allergies to soy or egg products.Pt not taking any weight loss meds or using  O2 at home. 

## 2015-09-24 NOTE — Telephone Encounter (Signed)
Dr Maryclare Bean pt is scheduled for his colon on 10/02/15.He is scheduled for a heart ablation 09/26/15 with Dr Baird Kay.He is having frequent PVC's with occasional SOB.Does the pt need an OV (had appt on 09/04/15) with our PA,or cardiac clearance or is a direct colon OK. Please advise,Thanks

## 2015-09-24 NOTE — Telephone Encounter (Signed)
He should not schedule colonoscopy until treatment for ablation is completed, he is stable, he is cleared by his cardiologist, and any issues with anticoagulation / antiplatelet drugs are addressed by his cardiologist

## 2015-09-24 NOTE — Telephone Encounter (Signed)
New message   Pt verbalized that he is scheduled for a procedure and he has come down with a cold

## 2015-09-24 NOTE — Telephone Encounter (Addendum)
Informed patient ok to continue with procedure on Thursday, per Dr. Curt Bears.  He verbalized understanding and relieved to be able to continue with ablation.  He is trying to get a Zpack from PCP today.

## 2015-09-25 ENCOUNTER — Encounter: Payer: Self-pay | Admitting: Internal Medicine

## 2015-09-26 ENCOUNTER — Encounter (HOSPITAL_COMMUNITY): Payer: Self-pay | Admitting: *Deleted

## 2015-09-26 ENCOUNTER — Encounter (HOSPITAL_COMMUNITY)
Admission: RE | Disposition: A | Discharge status: Home / Self Care | Payer: Self-pay | Source: Ambulatory Visit | Attending: Cardiology

## 2015-09-26 ENCOUNTER — Ambulatory Visit (HOSPITAL_COMMUNITY)
Admission: RE | Admit: 2015-09-26 | Discharge: 2015-09-27 | Disposition: A | Discharge status: Home / Self Care | Payer: Medicare HMO | Source: Ambulatory Visit | Attending: Cardiology | Admitting: Cardiology

## 2015-09-26 DIAGNOSIS — I4510 Unspecified right bundle-branch block: Secondary | ICD-10-CM | POA: Insufficient documentation

## 2015-09-26 DIAGNOSIS — E785 Hyperlipidemia, unspecified: Secondary | ICD-10-CM | POA: Insufficient documentation

## 2015-09-26 DIAGNOSIS — I10 Essential (primary) hypertension: Secondary | ICD-10-CM | POA: Diagnosis not present

## 2015-09-26 DIAGNOSIS — K219 Gastro-esophageal reflux disease without esophagitis: Secondary | ICD-10-CM | POA: Insufficient documentation

## 2015-09-26 DIAGNOSIS — G4733 Obstructive sleep apnea (adult) (pediatric): Secondary | ICD-10-CM | POA: Diagnosis not present

## 2015-09-26 DIAGNOSIS — Z8673 Personal history of transient ischemic attack (TIA), and cerebral infarction without residual deficits: Secondary | ICD-10-CM | POA: Insufficient documentation

## 2015-09-26 DIAGNOSIS — I493 Ventricular premature depolarization: Secondary | ICD-10-CM | POA: Diagnosis not present

## 2015-09-26 HISTORY — PX: OTHER SURGICAL HISTORY: SHX169

## 2015-09-26 HISTORY — PX: ELECTROPHYSIOLOGIC STUDY: SHX172A

## 2015-09-26 LAB — POCT ACTIVATED CLOTTING TIME
Activated Clotting Time: 235 seconds
Activated Clotting Time: 197 seconds
Activated Clotting Time: 180 seconds

## 2015-09-26 SURGERY — V TACH ABLATION
Anesthesia: LOCAL

## 2015-09-26 MED ORDER — ISOPROTERENOL HCL 0.2 MG/ML IJ SOLN
INTRAMUSCULAR | Status: AC
  Filled 2015-09-26: qty 5

## 2015-09-26 MED ORDER — METOPROLOL TARTRATE 25 MG PO TABS
25.0000 mg | ORAL_TABLET | Freq: Two times a day (BID) | ORAL | Status: DC
  Administered 2015-09-26 – 2015-09-27 (×2): 25 mg via ORAL
  Filled 2015-09-26 (×4): qty 1

## 2015-09-26 MED ORDER — FENTANYL CITRATE (PF) 100 MCG/2ML IJ SOLN
INTRAMUSCULAR | Status: AC
  Filled 2015-09-26: qty 2

## 2015-09-26 MED ORDER — MONTELUKAST SODIUM 10 MG PO TABS
10.0000 mg | ORAL_TABLET | Freq: Every day | ORAL | Status: DC
  Administered 2015-09-26: 10 mg via ORAL
  Filled 2015-09-26: qty 1

## 2015-09-26 MED ORDER — MIDAZOLAM HCL 5 MG/5ML IJ SOLN
INTRAMUSCULAR | Status: AC
  Filled 2015-09-26: qty 5

## 2015-09-26 MED ORDER — SODIUM CHLORIDE 0.9% FLUSH
3.0000 mL | Freq: Two times a day (BID) | INTRAVENOUS | Status: DC
  Administered 2015-09-26: 3 mL via INTRAVENOUS

## 2015-09-26 MED ORDER — FENTANYL CITRATE (PF) 100 MCG/2ML IJ SOLN
INTRAMUSCULAR | Status: DC | PRN
  Administered 2015-09-26 (×6): 25 ug via INTRAVENOUS

## 2015-09-26 MED ORDER — HEPARIN (PORCINE) IN NACL 2-0.9 UNIT/ML-% IJ SOLN
INTRAMUSCULAR | Status: DC | PRN
  Administered 2015-09-26: 08:00:00

## 2015-09-26 MED ORDER — HEPARIN SODIUM (PORCINE) 1000 UNIT/ML IJ SOLN
INTRAMUSCULAR | Status: DC | PRN
  Administered 2015-09-26: 5000 [IU] via INTRAVENOUS
  Administered 2015-09-26: 12000 [IU] via INTRAVENOUS
  Administered 2015-09-26: 1000 [IU] via INTRAVENOUS

## 2015-09-26 MED ORDER — ASPIRIN EC 81 MG PO TBEC
81.0000 mg | DELAYED_RELEASE_TABLET | Freq: Every day | ORAL | Status: DC
  Administered 2015-09-27: 81 mg via ORAL
  Filled 2015-09-26: qty 1

## 2015-09-26 MED ORDER — TAMSULOSIN HCL 0.4 MG PO CAPS
0.4000 mg | ORAL_CAPSULE | Freq: Every day | ORAL | Status: DC
  Administered 2015-09-26: 0.4 mg via ORAL
  Filled 2015-09-26: qty 1

## 2015-09-26 MED ORDER — AZELASTINE-FLUTICASONE 137-50 MCG/ACT NA SUSP: 1.0000 | Freq: Every day | NASAL | Status: DC

## 2015-09-26 MED ORDER — DOCUSATE SODIUM 100 MG PO CAPS
100.0000 mg | ORAL_CAPSULE | Freq: Two times a day (BID) | ORAL | Status: DC
  Administered 2015-09-26 – 2015-09-27 (×2): 100 mg via ORAL
  Filled 2015-09-26 (×2): qty 1

## 2015-09-26 MED ORDER — HEPARIN (PORCINE) IN NACL 2-0.9 UNIT/ML-% IJ SOLN
INTRAMUSCULAR | Status: AC
  Filled 2015-09-26: qty 500

## 2015-09-26 MED ORDER — LORATADINE 10 MG PO TABS
10.0000 mg | ORAL_TABLET | Freq: Every day | ORAL | Status: DC
  Administered 2015-09-26: 10 mg via ORAL
  Filled 2015-09-26: qty 1

## 2015-09-26 MED ORDER — SODIUM CHLORIDE 0.9% FLUSH: 3.0000 mL | INTRAVENOUS | Status: DC | PRN

## 2015-09-26 MED ORDER — LEVOCETIRIZINE DIHYDROCHLORIDE 5 MG PO TABS: 5.0000 mg | ORAL_TABLET | Freq: Every evening | ORAL | Status: DC

## 2015-09-26 MED ORDER — ONDANSETRON HCL 4 MG/2ML IJ SOLN: 4.0000 mg | Freq: Four times a day (QID) | INTRAMUSCULAR | Status: DC | PRN

## 2015-09-26 MED ORDER — ACETAMINOPHEN 325 MG PO TABS
650.0000 mg | ORAL_TABLET | ORAL | Status: DC | PRN
  Administered 2015-09-26: 650 mg via ORAL
  Filled 2015-09-26: qty 2

## 2015-09-26 MED ORDER — MIDAZOLAM HCL 5 MG/5ML IJ SOLN
INTRAMUSCULAR | Status: DC | PRN
  Administered 2015-09-26 (×9): 1 mg via INTRAVENOUS

## 2015-09-26 MED ORDER — HEPARIN SODIUM (PORCINE) 1000 UNIT/ML IJ SOLN
INTRAMUSCULAR | Status: AC
  Filled 2015-09-26: qty 1

## 2015-09-26 MED ORDER — TRAMADOL HCL 50 MG PO TABS
50.0000 mg | ORAL_TABLET | Freq: Four times a day (QID) | ORAL | Status: DC | PRN
  Administered 2015-09-26: 50 mg via ORAL
  Filled 2015-09-26: qty 1

## 2015-09-26 MED ORDER — ZOLPIDEM TARTRATE 5 MG PO TABS
5.0000 mg | ORAL_TABLET | Freq: Every day | ORAL | Status: DC
  Administered 2015-09-26: 5 mg via ORAL
  Filled 2015-09-26: qty 1

## 2015-09-26 MED ORDER — ROSUVASTATIN CALCIUM 10 MG PO TABS
5.0000 mg | ORAL_TABLET | Freq: Every day | ORAL | Status: DC
  Administered 2015-09-26: 5 mg via ORAL
  Filled 2015-09-26: qty 1

## 2015-09-26 MED ORDER — ALBUTEROL SULFATE (2.5 MG/3ML) 0.083% IN NEBU: 2.5000 mg | INHALATION_SOLUTION | RESPIRATORY_TRACT | Status: DC | PRN

## 2015-09-26 MED ORDER — BUPIVACAINE HCL (PF) 0.25 % IJ SOLN
INTRAMUSCULAR | Status: DC | PRN
  Administered 2015-09-26: 50 mL

## 2015-09-26 MED ORDER — AZELASTINE HCL 0.1 % NA SOLN
1.0000 | Freq: Two times a day (BID) | NASAL | Status: DC
  Filled 2015-09-26: qty 30

## 2015-09-26 MED ORDER — FLUTICASONE PROPIONATE 50 MCG/ACT NA SUSP
1.0000 | Freq: Every day | NASAL | Status: DC
  Filled 2015-09-26: qty 16

## 2015-09-26 MED ORDER — PANTOPRAZOLE SODIUM 20 MG PO TBEC
20.0000 mg | DELAYED_RELEASE_TABLET | Freq: Every day | ORAL | Status: DC
  Administered 2015-09-26 – 2015-09-27 (×2): 20 mg via ORAL
  Filled 2015-09-26 (×2): qty 1

## 2015-09-26 MED ORDER — METOPROLOL SUCCINATE ER 25 MG PO TB24: 25.0000 mg | ORAL_TABLET | Freq: Every day | ORAL | Status: DC

## 2015-09-26 MED ORDER — PRIMIDONE 50 MG PO TABS
50.0000 mg | ORAL_TABLET | Freq: Every day | ORAL | Status: DC
Start: 2015-09-26 — End: 2015-09-27
  Administered 2015-09-26 – 2015-09-27 (×2): 50 mg via ORAL
  Filled 2015-09-26 (×2): qty 1

## 2015-09-26 MED ORDER — CLOPIDOGREL BISULFATE 75 MG PO TABS
75.0000 mg | ORAL_TABLET | Freq: Every day | ORAL | Status: DC
  Administered 2015-09-26 – 2015-09-27 (×2): 75 mg via ORAL
  Filled 2015-09-26 (×2): qty 1

## 2015-09-26 MED ORDER — VITAMIN D 1000 UNITS PO TABS
2000.0000 [IU] | ORAL_TABLET | Freq: Every day | ORAL | Status: DC
Start: 2015-09-26 — End: 2015-09-27
  Administered 2015-09-27: 2000 [IU] via ORAL
  Filled 2015-09-26: qty 2

## 2015-09-26 MED ORDER — SODIUM CHLORIDE 0.9 % IV SOLN: 250.0000 mL | INTRAVENOUS | Status: DC | PRN

## 2015-09-26 MED ORDER — FINASTERIDE 5 MG PO TABS
5.0000 mg | ORAL_TABLET | Freq: Every day | ORAL | Status: DC
  Administered 2015-09-26: 5 mg via ORAL
  Filled 2015-09-26: qty 1

## 2015-09-26 MED ORDER — DULOXETINE HCL 30 MG PO CPEP
30.0000 mg | ORAL_CAPSULE | Freq: Every day | ORAL | Status: DC
  Administered 2015-09-26 – 2015-09-27 (×2): 30 mg via ORAL
  Filled 2015-09-26 (×2): qty 1

## 2015-09-26 MED ORDER — FLECAINIDE ACETATE 50 MG PO TABS
75.0000 mg | ORAL_TABLET | Freq: Two times a day (BID) | ORAL | Status: DC
  Administered 2015-09-26 – 2015-09-27 (×2): 75 mg via ORAL
  Filled 2015-09-26 (×4): qty 2

## 2015-09-26 MED ORDER — BUPIVACAINE HCL (PF) 0.25 % IJ SOLN
INTRAMUSCULAR | Status: AC
  Filled 2015-09-26: qty 60

## 2015-09-26 SURGICAL SUPPLY — 14 items
BAG SNAP BAND KOVER 36X36 (MISCELLANEOUS) ×2 IMPLANT
CATH JOSEPH QUAD ALLRED 6F REP (CATHETERS) ×2 IMPLANT
CATH SMTCH THERMOCOOL SF DF (CATHETERS) ×2 IMPLANT
CATH SOUNDSTAR 3D IMAGING (CATHETERS) ×2 IMPLANT
COVER SWIFTLINK CONNECTOR (BAG) ×2 IMPLANT
PACK EP LATEX FREE (CUSTOM PROCEDURE TRAY) ×1
PACK EP LF (CUSTOM PROCEDURE TRAY) ×1 IMPLANT
PAD DEFIB LIFELINK (PAD) ×2 IMPLANT
SHEATH AVANTI 11F 11CM (SHEATH) ×2 IMPLANT
SHEATH PINNACLE 6F 10CM (SHEATH) IMPLANT
SHEATH PINNACLE 7F 10CM (SHEATH) ×2 IMPLANT
SHEATH PINNACLE 8F 10CM (SHEATH) ×4 IMPLANT
SHIELD RADPAD SCOOP 12X17 (MISCELLANEOUS) ×2 IMPLANT
TUBING SMART ABLATE COOLFLOW (TUBING) ×2 IMPLANT

## 2015-09-26 NOTE — Progress Notes (Signed)
Site area: Left groin a 11 french venous sheath was removed  Site Prior to Removal:  Level 0  Pressure Applied For 20 MINUTES    Bedrest Beginning at  1520p  Manual:   Yes.    Patient Status During Pull:  stable  Post Pull Groin Site:  Level 0  Post Pull Instructions Given:  Yes.    Post Pull Pulses Present:  Yes.    Dressing Applied:  No.  Comments:  VS remain stable during sheath pull

## 2015-09-26 NOTE — Discharge Instructions (Signed)
No driving for 5 days. No lifting over 5 lbs for 1 week. No vigorous or sexual activity for 1 week. You may return to work on 10/03/15. Keep procedure site clean & dry. If you notice increased pain, swelling, bleeding or pus, call/return!  You may shower, but no soaking baths/hot tubs/pools for 1 week.

## 2015-09-26 NOTE — Progress Notes (Signed)
Site area: Right  Groin a 8 french arterial sheath and a 7 and 8 venous sheath was removed by Nelida Gores RN form [phone].  Site Prior to Removal:  Level 0  Pressure Applied For 30 MINUTES    Bedrest Beginning at 1520p  Manual:   Yes.    Patient Status During Pull:  stable  Post Pull Groin Site:  Level 0  Post Pull Instructions Given:  Yes.    Post Pull Pulses Present:  Yes.    Dressing Applied:  Yes.    Comments:  VS remain stable during sheath pull.

## 2015-09-26 NOTE — H&P (Signed)
Joshua [name] reports to the hospital with a history of stroke with a linq monitor in place.  On the LINQ, noted a high burden of PVCs and a Holter showed a burden of >20%.  On exam, irregular rhythm, no murmurs, lungs clear.  Presenting for ablation of PVCs.  Risks and benefits discussed.  Risks include bleeding, tamponade, heart block, and stroke.  The patient understands the risks and has agreed to the procedure.  Verlaine Embry Curt Bears, MD 09/26/2015 7:18 AM

## 2015-09-27 ENCOUNTER — Encounter (HOSPITAL_COMMUNITY): Payer: Self-pay | Admitting: Cardiology

## 2015-09-27 DIAGNOSIS — I493 Ventricular premature depolarization: Secondary | ICD-10-CM

## 2015-09-27 DIAGNOSIS — K219 Gastro-esophageal reflux disease without esophagitis: Secondary | ICD-10-CM | POA: Diagnosis not present

## 2015-09-27 DIAGNOSIS — Z8673 Personal history of transient ischemic attack (TIA), and cerebral infarction without residual deficits: Secondary | ICD-10-CM | POA: Diagnosis not present

## 2015-09-27 DIAGNOSIS — E785 Hyperlipidemia, unspecified: Secondary | ICD-10-CM | POA: Diagnosis not present

## 2015-09-27 DIAGNOSIS — G4733 Obstructive sleep apnea (adult) (pediatric): Secondary | ICD-10-CM | POA: Diagnosis not present

## 2015-09-27 DIAGNOSIS — I10 Essential (primary) hypertension: Secondary | ICD-10-CM | POA: Diagnosis not present

## 2015-09-27 DIAGNOSIS — I4510 Unspecified right bundle-branch block: Secondary | ICD-10-CM | POA: Diagnosis not present

## 2015-09-27 MED ORDER — FLECAINIDE ACETATE 150 MG PO TABS: 75.0000 mg | ORAL_TABLET | Freq: Two times a day (BID) | ORAL | 1 refills | Status: DC

## 2015-09-27 MED ORDER — OFF THE BEAT BOOK
Freq: Once | Status: DC
  Filled 2015-09-27: qty 1

## 2015-09-27 NOTE — Plan of Care (Signed)
Problem: Phase I Progression Outcomes Goal: Hemodynamically stable Outcome: Completed/Met Date Met: 09/27/15 VSS and pulses WNL

## 2015-09-27 NOTE — Discharge Summary (Signed)
ELECTROPHYSIOLOGY PROCEDURE DISCHARGE SUMMARY    Patient ID: Joshua [name],  MRN: [id number], DOB/AGE: 1945/07/29 70 y.o.  Admit date: 09/26/2015 Discharge date: 09/27/2015  Primary Care Physician: Marton Redwood, MD Electrophysiologist: [facility]   Primary Discharge Diagnosis:  PVC's s/p ablation this admission  Secondary Diagnosis: 1.  CVA 2.  OSA 3.  Hyperlipidemia 4.  GERD 5.  HTN 6.  RBBB  Allergies  Allergen Reactions  . Floxin [Ofloxacin] Other (See Comments)    Lowers BP    Procedures This Admission:  1.  Electrophysiology study and radiofrequency catheter ablation on 09/26/15 by Dr Curt Bears. This study demonstrated sinus rhythm upon presentation; unsuccessful PVC ablation with less PVCs than prior; no early apparent complications.  Brief HPI:  Joshua [name] is a 70 y.o. male with a past medical history as outlined above. He underwent ILR implant to evaluate for atrial fibrillation post stroke.  He was identified to have frequent PVC's. Risks, benefits to PVC ablation were discussed with the patient who wished to proceed.  Hospital Course:  Joshua [name] is a 70 y.o. male underwent EPS/ablation with details as outlined above. He was monitored on telemetry overnight which demonstrated sinus rhythm with PVC's. Groin was without complications. He was examined by Dr Curt Bears and considered stable for discharge to home.   He was started on Flecanide and Metoprolol by Dr Curt Bears at discharge. Consideration should be given to an GXT at follow up. Follow up Dr Curt Bears 2 weeks for further evaluation.   Physical Exam: Vitals:   09/26/15 2126 09/26/15 2143 09/27/15 0010 09/27/15 0500  BP: 116/83 130/69 (!) 100/54 112/77  Pulse: 94 88 69 67  Resp: _0 Temp: 98.4 F (36.9 C)   98 F (36.7 C)  TempSrc: Oral   Oral  SpO2: 94%  94% 94%  Weight:    218 lb 3.2 oz (99 kg)  Height:        Labs:   Lab Results  Component Value Date   WBC 8.4 09/18/2015   HGB 14.8  09/18/2015   HCT 42.1 09/18/2015   MCV 89.4 09/18/2015   PLT 200 09/18/2015   No results for input(s): NA, K, CL, CO2, BUN, CREATININE, CALCIUM, PROT, BILITOT, ALKPHOS, ALT, AST, GLUCOSE in the last 168 hours.  Invalid input(s): LABALBU   Discharge Medications:  Current Discharge Medication List    START taking these medications   Details  flecainide (TAMBOCOR) 150 MG tablet Take 0.5 tablets (75 mg total) by mouth every 12 (twelve) hours. Qty: 60 tablet, Refills: 1      CONTINUE these medications which have NOT CHANGED   Details  albuterol (PROVENTIL HFA;VENTOLIN HFA) 108 (90 BASE) MCG/ACT inhaler Inhale 2 puffs into the lungs every 4 (four) hours as needed for wheezing.     Cholecalciferol (VITAMIN D) 2000 units CAPS Take 2,000 Units by mouth daily.    clopidogrel (PLAVIX) 75 MG tablet Take 1 tablet (75 mg total) by mouth daily. Qty: 30 tablet, Refills: 2    diclofenac sodium (VOLTAREN) 1 % GEL Apply 2 g topically at bedtime.    dicyclomine (BENTYL) 10 MG capsule Take 1 capsule (10 mg total) by mouth 3 (three) times daily with meals as needed for spasms. Qty: 30 capsule, Refills: 1    Docusate Calcium (STOOL SOFTENER PO) Take 1 capsule by mouth 2 (two) times daily.     DULoxetine (CYMBALTA) 60 MG capsule Take by mouth daily.     DYMISTA 137-50 MCG/ACT  SUSP Place 1 puff into both nostrils at bedtime.     finasteride (PROSCAR) 5 MG tablet Take 5 mg by mouth Daily.    fish oil-omega-3 fatty acids 1000 MG capsule Take 1 g by mouth.     lansoprazole (PREVACID) 30 MG capsule Take 30 mg by mouth 2 (two) times daily.    levocetirizine (XYZAL) 5 MG tablet Take 5 mg by mouth every evening.      metoprolol succinate (TOPROL XL) 25 MG 24 hr tablet Take 1 tablet (25 mg total) by mouth daily. Qty: 30 tablet, Refills: 12    montelukast (SINGULAIR) 10 MG tablet Take 10 mg by mouth at bedtime.      Na Sulfate-K Sulfate-Mg Sulf (SUPREP BOWEL PREP KIT) 17.5-3.13-1.6 GM/180ML SOLN  Suprep as directed / no substitutions Qty: 354 mL, Refills: 0    primidone (MYSOLINE) 50 MG tablet Take 50 mg by mouth daily. Refills: 6    rosuvastatin (CRESTOR) 10 MG tablet Take 5 mg by mouth daily. Take 1/2 tablet daily    tamsulosin (FLOMAX) 0.4 MG CAPS capsule Take 0.4 mg by mouth daily after supper.     zolpidem (AMBIEN) 10 MG tablet Take 10 mg by mouth at bedtime.    aspirin EC 81 MG tablet Take 81 mg by mouth. Take one pill daily (for 5 days) while off Plavix .        Disposition: Pt is being discharged home today in good condition. Discharge Instructions    Diet - low sodium heart healthy    Complete by:  As directed    Increase activity slowly    Complete by:  As directed      Follow-up Information    Nicoletta Hush Meredith Leeds, MD Follow up on 10/15/2015.   Specialty:  Cardiology Why:  at 10:30AM Contact information: [place] [address] [phone]           Duration of Discharge Encounter: Greater than 30 minutes including physician time.  Signed, Chanetta Marshall, NP 09/27/2015 8:48 AM  I have seen and examined this patient with Chanetta Marshall.  Agree with above, note added to reflect my findings.  On exam, iRRR, no murmurs, lungs clear.  Had PVC ablation with continued PVCs on monitor today.  Joshua [name] start flecainide and follow up in clinic.    Joshua Pelzer M. Flor Whitacre MD 09/27/2015 9:22 AM

## 2015-09-27 NOTE — Plan of Care (Signed)
Problem: Phase I Progression Outcomes Goal: Voiding-avoid urinary catheter unless indicated Outcome: Completed/Met Date Met: 09/27/15 Voiding quantity sufficient with no S/S of complications noted.   

## 2015-09-27 NOTE — Plan of Care (Signed)
Problem: Phase I Progression Outcomes Goal: Hemostasis of puncture sites Outcome: Completed/Met Date Met: 09/27/15 Patient bilateral groins with dressings clean, dry and intact, soft at site with no complications noted, pulses strong, skin wqrm, dry and intact with good capillary refill to toes and sensation WNL.

## 2015-09-27 NOTE — Plan of Care (Signed)
Problem: Education: Goal: Knowledge of [facility] Education information/materials will improve Outcome: Progressing Reviewed importance of calling for assistance wen getting OOB especially the first time and instructed on white board and how to call RN/NT on cell phones if he needs assistance, bedside stand within reach with his personal belongings within reach. Patient was given written handout, reviewed and questions answered regarding a new medication for his frequent PVC's and he verbalized understanding.

## 2015-09-27 NOTE — Plan of Care (Signed)
Problem: Phase I Progression Outcomes Goal: Pain controlled with appropriate interventions Outcome: Progressing Patient has chronic back pain but only has complaint of some soreness at bilateral groin sites and is controlled with medication.

## 2015-09-27 NOTE — Progress Notes (Signed)
Call placed regarding pain not relieved with Tylenol. Patient has chronic back pain and let me know what he takes at home, will notify MD and see if we can get it ordered and given to help his pain while in the hospital.

## 2015-09-27 NOTE — Progress Notes (Signed)
Report received in patient's room via [place] RN using [employer], reviewed orders, labs, VS, meds and patient's general condition, assumed care of patient.

## 2015-09-27 NOTE — Plan of Care (Signed)
Problem: Phase I Progression Outcomes Goal: Other Phase I Outcomes/Goals Outcome: Progressing VSS and bilateral groin sites wit dressings remain clean, dry and intact with no s/s of complications noted.

## 2015-09-28 ENCOUNTER — Telehealth: Payer: Self-pay | Admitting: Physician Assistant

## 2015-09-28 NOTE — Telephone Encounter (Signed)
Joshua [name] is a 70 y.o. male s/p attempted VT ablation 09/26/15. He notes development of blisters in the R groin area yesterday. The incision sites appear stable. No significant redness.  No pain. No purulent discharge noted.  No fevers or chills. Reassurance. Advised him to keep an eye on this.  He should call back if condition worsens. I will have RN for Dr. Allegra Lai call him on Monday 9/18 to see if he needs to be brought in for an appointment.  Richardson Dopp, PA-C   09/28/2015 1:46 PM

## 2015-09-30 LAB — CUP PACEART REMOTE DEVICE CHECK: Date Time Interrogation Session: 20170820143538

## 2015-09-30 NOTE — Telephone Encounter (Signed)
Called to check on patient. Patient tells me he had blisters form under the tape at groin area.  States that some "popped" when taking the tape off.  Area is red and irritated. States he has not assessed area yet today.  Advised to call office if not healing and will discuss with Dr. Curt Bears. Patient verbalized understanding and agreeable to plan.

## 2015-09-30 NOTE — Progress Notes (Signed)
Carelink summary report received. Battery status OK. Normal device function. No new symptom episodes, tachy episodes, brady, or pause episodes. No new AF episodes. Monthly summary reports and ROV/PRN 

## 2015-10-01 ENCOUNTER — Ambulatory Visit (INDEPENDENT_AMBULATORY_CARE_PROVIDER_SITE_OTHER): Payer: Medicare HMO | Admitting: *Deleted

## 2015-10-01 ENCOUNTER — Encounter: Payer: Self-pay | Admitting: Cardiology

## 2015-10-01 DIAGNOSIS — I639 Cerebral infarction, unspecified: Secondary | ICD-10-CM | POA: Diagnosis not present

## 2015-10-01 NOTE — Telephone Encounter (Signed)
Per Dr Henrene Pastor, colon was cancelled.See note on 09/24/15.

## 2015-10-01 NOTE — Progress Notes (Signed)
Carelink Summary Report / Loop Recorder 

## 2015-10-02 ENCOUNTER — Encounter: Payer: Medicare HMO | Admitting: Internal Medicine

## 2015-10-04 DIAGNOSIS — J3089 Other allergic rhinitis: Secondary | ICD-10-CM | POA: Diagnosis not present

## 2015-10-04 DIAGNOSIS — H1045 Other chronic allergic conjunctivitis: Secondary | ICD-10-CM | POA: Diagnosis not present

## 2015-10-04 DIAGNOSIS — J4520 Mild intermittent asthma, uncomplicated: Secondary | ICD-10-CM | POA: Diagnosis not present

## 2015-10-07 ENCOUNTER — Telehealth: Payer: Self-pay | Admitting: Neurology

## 2015-10-07 NOTE — Telephone Encounter (Signed)
Dr Ahern- please advise 

## 2015-10-07 NOTE — Telephone Encounter (Signed)
Patient called, is planning on doing Colonoscopy w/Dr. Ree Shay GI and states Dr. Henrene Pastor needs to know how long patient will need to be off of Plavix or if he needs to be off of Plavix. Please call to advise [phone].

## 2015-10-08 ENCOUNTER — Other Ambulatory Visit: Payer: Self-pay | Admitting: Neurology

## 2015-10-08 ENCOUNTER — Encounter: Payer: Self-pay | Admitting: Neurology

## 2015-10-08 NOTE — Telephone Encounter (Signed)
Can u print letter for me p;ease?

## 2015-10-08 NOTE — Telephone Encounter (Signed)
Called patient. Advised Dr Jaynee Eagles wrote letter. He was not sure of fax number to Dr Henrene Pastor. He asked me to call [phone] to get fax. Advised we will send letter to Dr Henrene Pastor re Plavix. He verbalized understanding.   Faxed signed letter to [phone] by AA,MD. Received confirmation.

## 2015-10-09 ENCOUNTER — Encounter: Payer: Self-pay | Admitting: *Deleted

## 2015-10-09 ENCOUNTER — Encounter: Payer: Self-pay | Admitting: Neurology

## 2015-10-09 ENCOUNTER — Telehealth: Payer: Self-pay | Admitting: Internal Medicine

## 2015-10-09 NOTE — Progress Notes (Signed)
Faxed letter from Dr Jaynee Eagles dated 10/09/15 and signed/completed medical clearance form for upper endoscopy back to [facility]. Fax:[phone]. Received confirmation.

## 2015-10-09 NOTE — Telephone Encounter (Signed)
Left message for pt to call back  °

## 2015-10-10 DIAGNOSIS — H35033 Hypertensive retinopathy, bilateral: Secondary | ICD-10-CM | POA: Diagnosis not present

## 2015-10-10 NOTE — Telephone Encounter (Signed)
Discussed with pt that there is a letter of clearance from neurology but not cardiology. Pt states he will contact cardiology regarding clearance.

## 2015-10-14 NOTE — Progress Notes (Signed)
Electrophysiology Office Note   Date:  10/15/2015   ID:  Joshua [name], DOB Dec 18, 1945, MRN [id number]  PCP:  Marton Redwood, MD  Primary Electrophysiologist:  Joshua Brucato Meredith Leeds, MD    Chief Complaint  Patient presents with  . Follow-up    post ablation/Flecanide     History of Present Illness: Joshua [name] is a 70 y.o. male who presents today for electrophysiology evaluation.   He has a history of CVA, OSA, HLD, GERD, HTN, RBBB.  His stroke occurred 02/2015.  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  A linq monitor was implanted.  Since then it was noted that he had an excessive amount of PVCs on his monitor. He complains of shortness of breath, dyspnea on exertion, and orthopnea. His monitor showed that he was having 26% PVCs. Unsuccessful PVC ablation performed with intermittent suppression of PVCs. He was started on flecainide 75 mg twice a day, and is felt much improved since leaving the hospital. He says that he has much more energy and quite a bit less episodes of fatigue and shortness of breath.  Today, he denies symptoms of chest pain, lower extremity edema, claudication, dizziness, presyncope, syncope, bleeding, or neurologic sequela. The patient is tolerating medications without difficulties and is otherwise without complaint today. He does continue to have occasional palpitations.   Past Medical History:  Diagnosis Date  . Allergic rhinitis   . Allergy   . Anal fissure   . Anxiety    from chronic pain from surgery- on Cymbalta  . Arthritis   . Asthma   . Barrett's esophagus 03/29/2014  . Carotid artery disease ([place])    Carotid Doppler normal August, 2007  . Cataract   . Chest pain    Coronaries normal 1996 /  nuclear..06/2002..normal...EF  56% /  stress echo.. May, 2011.... no  scar or ischemia... rate related RBBB  . Diverticulosis   . Dyslipidemia   . Ejection fraction    EF 60%, stress  echo, May, 2011  . GERD (gastroesophageal reflux disease)   . Headache   . History of loop recorder    has since 04/04/15  . HTN (hypertension)    takes Metoprolol for PVC control  . Hx of colonic polyps    adenomatous  . Hx of colonoscopy   . Hyperlipidemia   . IFG (impaired fasting glucose)   . Palpitations    Benign PVCs  . RBBB (right bundle branch block)    rate related  . Shingles   . Sleep apnea    pt denies  . TIA (transient ischemic attack) 02/2015   Per pt, had 2 strokes  . Tremor    Hand tremor  . Vertigo    Past Surgical History:  Procedure Laterality Date  . BACK SURGERY  2002,2009   x 6  . CHOLECYSTECTOMY  11/30/2012   with IOC  . COLONOSCOPY    . ELECTROPHYSIOLOGIC STUDY N/A 09/26/2015   Procedure: V Tach Ablation (PVC);  Surgeon: Joshua Suire Meredith Leeds, MD;  Location: [facility];  Service: Cardiovascular;  Laterality: N/A;  . EP IMPLANTABLE DEVICE N/A 04/04/2015   Procedure: Loop Recorder Insertion;  Surgeon: Joshua Blandino Meredith Leeds, MD;  Location: [facility];  Service: Cardiovascular;  Laterality: N/A;  . HERNIA REPAIR     laprascopic  . NECK SURGERY  2002  . ROTATOR CUFF REPAIR Left   .  TEE WITHOUT CARDIOVERSION N/A 04/04/2015   Procedure: TRANSESOPHAGEAL ECHOCARDIOGRAM (TEE);  Surgeon: Joshua Perla, MD;  Location: [facility];  Service: Cardiovascular;  Laterality: N/A;  . UPPER GASTROINTESTINAL ENDOSCOPY       Current Outpatient Prescriptions  Medication Sig Dispense Refill  . albuterol (PROVENTIL HFA;VENTOLIN HFA) 108 (90 BASE) MCG/ACT inhaler Inhale 2 puffs into the lungs every 4 (four) hours as needed for wheezing.     [name] aspirin EC 81 MG tablet Take 81 mg by mouth. Take one pill daily (for 5 days) while off Plavix .    [name] Cholecalciferol (VITAMIN D) 2000 units CAPS Take 2,000 Units by mouth daily.    . clopidogrel (PLAVIX) 75 MG tablet Take 1 tablet (75 mg total) by mouth daily. 30 tablet 2  . diclofenac sodium (VOLTAREN) 1 % GEL  Apply 2 g topically at bedtime.    . dicyclomine (BENTYL) 10 MG capsule Take 10 mg by mouth 3 (three) times daily as needed for spasms.    [name] Calcium (STOOL SOFTENER PO) Take 1 capsule by mouth 2 (two) times daily.     . DULoxetine (CYMBALTA) 60 MG capsule Take by mouth daily.     [name] DYMISTA 137-50 MCG/ACT SUSP Place 1 puff into both nostrils at bedtime.     . finasteride (PROSCAR) 5 MG tablet Take 5 mg by mouth Daily.    . fish oil-omega-3 fatty acids 1000 MG capsule Take 1 g by mouth.     . flecainide (TAMBOCOR) 150 MG tablet Take 0.5 tablets (75 mg total) by mouth every 12 (twelve) hours. 60 tablet 1  . lansoprazole (PREVACID) 30 MG capsule Take 30 mg by mouth 2 (two) times daily.    [name] levocetirizine (XYZAL) 5 MG tablet Take 5 mg by mouth every evening.      . metoprolol succinate (TOPROL XL) 25 MG 24 hr tablet Take 1 tablet (25 mg total) by mouth daily. 30 tablet 12  . montelukast (SINGULAIR) 10 MG tablet Take 10 mg by mouth at bedtime.      . Na Sulfate-K Sulfate-Mg Sulf (SUPREP BOWEL PREP KIT) 17.5-3.13-1.6 GM/180ML SOLN Suprep as directed / no substitutions 354 mL 0  . primidone (MYSOLINE) 50 MG tablet Take 50 mg by mouth daily.  6  . rosuvastatin (CRESTOR) 10 MG tablet Take 5 mg by mouth daily. Take 1/2 tablet daily    . tamsulosin (FLOMAX) 0.4 MG CAPS capsule Take 0.4 mg by mouth daily after supper.     . zolpidem (AMBIEN) 10 MG tablet Take 10 mg by mouth at bedtime.     No current facility-administered medications for this visit.     Allergies:   Floxin [ofloxacin]   Social History:  The patient  reports that he has never smoked. He has never used smokeless tobacco. He reports that he does not drink alcohol or use drugs.   Family History:  The patient's family history includes Breast cancer in his paternal grandmother; Clotting disorder in his father; Heart attack in his father and paternal grandfather; Heart disease in his brother and father; Hypertension in his mother.     ROS:  Please see the history of present illness.   Otherwise, review of systems is positive for none.   All other systems are reviewed and negative.    PHYSICAL EXAM: VS:  BP 112/80   Pulse 67   Ht '6\' 1"'$  (1.854 m)   Wt 220 lb 12.8 oz (100.2 kg)   BMI 29.13 kg/m  ,  BMI Body mass index is 29.13 kg/m. GEN: Well nourished, well developed, in no acute distress  HEENT: normal  Neck: no JVD, carotid bruits, or masses Cardiac: RRR; no murmurs, rubs, or gallops,no edema  Respiratory:  clear to auscultation bilaterally, normal work of breathing GI: soft, nontender, nondistended, + BS MS: no deformity or atrophy  Skin: warm and dry,  device pocket is well healed Neuro:  Strength and sensation are intact Psych: euthymic mood, full affect  EKG:  EKG is ordered today. Personal review of the ECG today shows sinus rhythm, rate 67, PVC  Personal review of the device interrogation is reviewed today in detail.  See PaceArt for details.   Recent Labs: 04/04/2015: TSH 1.240 08/25/2015: ALT 22 09/18/2015: BUN 19; Creat 0.88; Hemoglobin 14.8; Platelets 200; Potassium 4.4; Sodium 138    Lipid Panel     Component Value Date/Time   CHOL 137 03/06/2015 0550   TRIG 105 03/06/2015 0550   HDL 41 03/06/2015 0550   CHOLHDL 3.3 03/06/2015 0550   VLDL 21 03/06/2015 0550   LDLCALC 75 03/06/2015 0550     Wt Readings from Last 3 Encounters:  10/15/15 220 lb 12.8 oz (100.2 kg)  09/27/15 218 lb 3.2 oz (99 kg)  09/24/15 220 lb (99.8 kg)      Other studies Reviewed: Additional studies/ records that were reviewed today include: TEE 04/04/15  Review of the above records today demonstrates:  - Left ventricle: Systolic function was normal. The estimated  ejection fraction was in the range of 50% to 55%. Wall motion was  normal; there were no regional wall motion abnormalities. - Aortic valve: No evidence of vegetation. There was trivial  regurgitation. - Mitral valve: No evidence of vegetation.  There was mild  regurgitation. - Left atrium: The atrium was mildly dilated. No evidence of  thrombus in the atrial cavity or appendage. - Right atrium: No evidence of thrombus in the atrial cavity or  appendage. - Atrial septum: There was a patent foramen ovale. - Tricuspid valve: No evidence of vegetation. - Pulmonic valve: No evidence of vegetation.  Holter 06/26/15 Average HR: 88 bpm Minimum HR: 56 bpm Maximum HR: 128 bpm  26% PVCs  ASSESSMENT AND PLAN:  1.  PVCs: Ablaiton performed with intermittent suppression. Had PVCs noted post ablation and was started on flecainide. Since then he has done well without any major issues. We'll therefore continue him on his flecainide as well as his Toprol-XL.   2. Hypertension: Currently well controlled  3. Preprocedure evaluation: Patient is planned to have a colonoscopy performed. He does have PVCs, but otherwise his cardiac history shows no major abnormalities. He would be at intermediate risk for a low risk procedure. Would continue his Toprol-XL, flecainide, crestor, and aspirin if possible through the periprocedural time period  Current medicines are reviewed at length with the patient today.   The patient does not have concerns regarding his medicines.  The following changes were made today:  none  Labs/ tests ordered today include:  No orders of the defined types were placed in this encounter.    Disposition:   FU with Ivadell Gaul 6 months   Signed, Joshua Seabury Meredith Leeds, MD  10/15/2015 10:39 AM     [facility] [address] [place] [address] [phone] (office) [phone] (fax)

## 2015-10-15 ENCOUNTER — Encounter: Payer: Self-pay | Admitting: Cardiology

## 2015-10-15 ENCOUNTER — Ambulatory Visit (INDEPENDENT_AMBULATORY_CARE_PROVIDER_SITE_OTHER): Payer: Medicare HMO | Admitting: Cardiology

## 2015-10-15 VITALS — BP 112/80 | HR 67 | Ht 73.0 in | Wt 220.8 lb

## 2015-10-15 DIAGNOSIS — I493 Ventricular premature depolarization: Secondary | ICD-10-CM

## 2015-10-15 LAB — CUP PACEART INCLINIC DEVICE CHECK: Date Time Interrogation Session: 20171003142915

## 2015-10-15 NOTE — Patient Instructions (Signed)
Medication Instructions:  Your physician recommends that you continue on your current medications as directed. Please refer to the Current Medication list given to you today.  If you need a refill on your cardiac medications before your next appointment, please call your pharmacy.   Labwork: None ordered  Testing/Procedures: None ordered  Follow-Up: Your physician wants you to follow-up in: 6 months with Dr. Camnitz.  You will receive a reminder letter in the mail two months in advance. If you don't receive a letter, please call our office to schedule the follow-up appointment.  Thank you for choosing [facility]!!   Xaria Judon, RN [phone]         

## 2015-10-17 DIAGNOSIS — K2270 Barrett's esophagus without dysplasia: Secondary | ICD-10-CM | POA: Diagnosis not present

## 2015-10-17 DIAGNOSIS — J45909 Unspecified asthma, uncomplicated: Secondary | ICD-10-CM | POA: Diagnosis not present

## 2015-10-17 DIAGNOSIS — K317 Polyp of stomach and duodenum: Secondary | ICD-10-CM | POA: Diagnosis not present

## 2015-10-17 DIAGNOSIS — Z8719 Personal history of other diseases of the digestive system: Secondary | ICD-10-CM | POA: Diagnosis not present

## 2015-10-17 DIAGNOSIS — I10 Essential (primary) hypertension: Secondary | ICD-10-CM | POA: Diagnosis not present

## 2015-10-17 DIAGNOSIS — K449 Diaphragmatic hernia without obstruction or gangrene: Secondary | ICD-10-CM | POA: Diagnosis not present

## 2015-10-17 DIAGNOSIS — K219 Gastro-esophageal reflux disease without esophagitis: Secondary | ICD-10-CM | POA: Diagnosis not present

## 2015-10-17 DIAGNOSIS — E785 Hyperlipidemia, unspecified: Secondary | ICD-10-CM | POA: Diagnosis not present

## 2015-10-17 DIAGNOSIS — Z8673 Personal history of transient ischemic attack (TIA), and cerebral infarction without residual deficits: Secondary | ICD-10-CM | POA: Diagnosis not present

## 2015-10-17 DIAGNOSIS — Z01818 Encounter for other preprocedural examination: Secondary | ICD-10-CM | POA: Diagnosis not present

## 2015-10-17 DIAGNOSIS — K2930 Chronic superficial gastritis without bleeding: Secondary | ICD-10-CM | POA: Diagnosis not present

## 2015-10-17 DIAGNOSIS — Z09 Encounter for follow-up examination after completed treatment for conditions other than malignant neoplasm: Secondary | ICD-10-CM | POA: Diagnosis not present

## 2015-10-22 ENCOUNTER — Telehealth: Payer: Self-pay | Admitting: Neurology

## 2015-10-22 NOTE — Telephone Encounter (Signed)
Patient reports new tremor rt hand and arm much less in the rt leg that started about 6 mths ago.  An appointment was made for 12/03/15.  Advised that the nurse would call if there was any other questions.

## 2015-10-22 NOTE — Telephone Encounter (Signed)
Dr Ahern- FYI 

## 2015-10-22 NOTE — Telephone Encounter (Signed)
Thanks

## 2015-10-24 ENCOUNTER — Telehealth: Payer: Self-pay | Admitting: Internal Medicine

## 2015-10-24 NOTE — Telephone Encounter (Signed)
OK to schedule pt for previsit and colonoscopy per Dr. Henrene Pastor. Please schedule him for colon.

## 2015-10-26 LAB — CUP PACEART REMOTE DEVICE CHECK: Date Time Interrogation Session: 20170919150549

## 2015-10-26 NOTE — Progress Notes (Signed)
Carelink summary report received. Battery status OK. Normal device function. No new symptom episodes, tachy episodes, brady, or pause episodes. No new AF episodes. Monthly summary reports and ROV/PRN 

## 2015-10-28 ENCOUNTER — Encounter: Payer: Self-pay | Admitting: Internal Medicine

## 2015-10-28 ENCOUNTER — Telehealth: Payer: Self-pay

## 2015-10-28 NOTE — Telephone Encounter (Signed)
-----   Message from Will Meredith Leeds, MD sent at 10/28/2015  1:47 PM EDT ----- Regarding: RE: Plavix I did not prescribe his plavix.  This is for neurology s/p stroke.  Thanks  WC ----- Message ----- From: Algernon Huxley, RN Sent: 10/28/2015  12:15 PM To: Will Meredith Leeds, MD Subject: Plavix                                         Dr. Curt Bears,  Dr. Henrene Pastor will be doing a colonoscopy on Mr. Baughn in November. Please advise if ok for pt to hold plavix for 5 days prior to colonoscopy.   Thanks, Rosanne Sack RN

## 2015-10-28 NOTE — Telephone Encounter (Signed)
See note from Neurologist in letters tab dated 10/09/15 regarding holding plavix.

## 2015-10-31 ENCOUNTER — Ambulatory Visit (INDEPENDENT_AMBULATORY_CARE_PROVIDER_SITE_OTHER): Payer: Medicare HMO | Admitting: *Deleted

## 2015-10-31 DIAGNOSIS — I639 Cerebral infarction, unspecified: Secondary | ICD-10-CM

## 2015-10-31 DIAGNOSIS — Z23 Encounter for immunization: Secondary | ICD-10-CM | POA: Diagnosis not present

## 2015-10-31 NOTE — Progress Notes (Signed)
Carelink Summary Report / Loop Recorder 

## 2015-11-01 ENCOUNTER — Encounter: Payer: Medicare HMO | Admitting: Cardiology

## 2015-11-26 DIAGNOSIS — M4716 Other spondylosis with myelopathy, lumbar region: Secondary | ICD-10-CM | POA: Diagnosis not present

## 2015-11-30 ENCOUNTER — Other Ambulatory Visit: Payer: Self-pay | Admitting: Internal Medicine

## 2015-11-30 LAB — CUP PACEART REMOTE DEVICE CHECK
Date Time Interrogation Session: 20171019153613
Implantable Pulse Generator Implant Date: 20170323

## 2015-11-30 NOTE — Progress Notes (Signed)
Carelink summary report received. Battery status OK. Normal device function. No new symptom episodes, tachy episodes, brady, or pause episodes. No new AF episodes. Monthly summary reports and ROV/PRN 

## 2015-12-02 ENCOUNTER — Ambulatory Visit (INDEPENDENT_AMBULATORY_CARE_PROVIDER_SITE_OTHER): Payer: Medicare HMO | Admitting: *Deleted

## 2015-12-02 DIAGNOSIS — I639 Cerebral infarction, unspecified: Secondary | ICD-10-CM | POA: Diagnosis not present

## 2015-12-02 NOTE — Progress Notes (Signed)
Carelink Summary Report / Loop Recorder 

## 2015-12-03 ENCOUNTER — Encounter: Payer: Self-pay | Admitting: Neurology

## 2015-12-03 ENCOUNTER — Ambulatory Visit (INDEPENDENT_AMBULATORY_CARE_PROVIDER_SITE_OTHER): Payer: Medicare HMO | Admitting: Neurology

## 2015-12-03 VITALS — BP 138/89 | HR 73 | Ht 73.0 in | Wt 229.8 lb

## 2015-12-03 DIAGNOSIS — G250 Essential tremor: Secondary | ICD-10-CM

## 2015-12-03 DIAGNOSIS — I63412 Cerebral infarction due to embolism of left middle cerebral artery: Secondary | ICD-10-CM | POA: Diagnosis not present

## 2015-12-03 MED ORDER — PRIMIDONE 50 MG PO TABS: 100.0000 mg | ORAL_TABLET | Freq: Every day | ORAL | 6 refills | Status: DC

## 2015-12-03 NOTE — Patient Instructions (Addendum)
Remember to drink plenty of fluid, eat healthy meals and do not skip any meals. Try to eat protein with a every meal and eat a healthy snack such as fruit or nuts in between meals. Try to keep a regular sleep-wake schedule and try to exercise daily, particularly in the form of walking, 20-30 minutes a day, if you can.   As far as your medications are concerned, I would like to suggest: Increase primidone to 75mg  for 2 weeks then 100mg  (1.5 pills and then 2 pills) at bedtime  I would like to see you back in 3-4 months, sooner if we need to. Please call us with any interim questions, concerns, problems, updates or refill requests.   Our phone number is [phone]. We also have an after hours call service for urgent matters and there is a physician on-call for urgent questions. For any emergencies you know to call 911 or go to the nearest emergency room

## 2015-12-03 NOTE — Progress Notes (Signed)
[facility]    Provider:  Dr Jaynee Eagles Referring Provider: Marton Redwood, MD Primary Care Physician:  Marton Redwood, MD   CC:  Left MCA stroke  Interval history 12/03/2015:  New problem, essential tremor  Continues to have right hand weakness, but has been improving after the stroke. Has tremor on both hands, this has been going on even before his stroke since his 24's. R hand is more than the left. It's getting worse gradually Tremor is worse with activity. He drops things from his right hand. He is able to stop the tremor temporarily but continues as soon as he is distracted. His brother has a tremor as well. Tremor is positional in the right hand. Can't write name anymore, More with action. He doesn't notice a chin tremor. Primidone 50mg  daily is not helping.   HPI:  Joshua [name] is a 70 y.o. male here as a referral from Dr. Brigitte Pulse for embolic stroke. Past medical history of vertigo, right bundle branch block, tremor, impaired fasting glucose, asthma, anxiety, hyperlipidemia, hypertension, headache. Patient was admitted to [facility] on 03/05/2015 with acute onset of headache after waking up followed by acute onset right hand weakness at 9 AM. He also experienced facial drooping. No vision or speech changes. NIH stroke scale was 1 and rapidly improving and IV tPA was not administered. Patient returns today for follow-up. Still having some difficulty with fine motor movements of the right hand. He is having shortness of breath and is following with cardiology with recent TEE and loop recorder placement. He is very winded and can't catch his breath. The episodes are discrete, by the time he gets up the steps he can be struggling for air. Always when he first starts to walk. But symptoms also can be after he is walking for an extended period of time. No new stroke symptoms, no new weakness, denies dysarthria, dysphagia, aphasia. Reviewed images of the brain with patient and his wife  again today. Answered questions about his stroke.Discussion with patient and wife at regarding his strokes which appear to be embolic from an unknown source. Echo and carotid Dopplers have been unrevealing. Recommend outpatient transesophageal echocardiogram and possible loop recorder. If atrial fibrillation is found do recommend anticoagulation. The patient is aware of atrial fibrillation and the risks of stroke because his twin Brother has atrial fibrillation and is on anticoagulation.  Reviewed notes, labs and imaging from outside physicians, which showed:  Personally reviewed images and agree with the following:  Ct Head Wo Contrast 03/05/2015  No acute intracranial abnormality.   Mr [name] Head Wo Contrast 03/05/2015   MRI HEAD  Tiny acute nonhemorrhagic infarcts left motor strip and left postcentral gyrus. Moderate patchy and punctate white matter changes most notable periventricular region consistent with result of chronic microvascular changes. Mild global atrophy without hydrocephalus. Mild exophthalmos. Degenerative changes occiput-C1 junction.  MRA HEAD  Anterior circulation without medium or large size vessel significant stenosis or occlusion. Mild middle cerebral artery branch vessel irregularity bilaterally. Slight irregularity distal vertebral arteries without significant narrowing. Nonvisualized anterior inferior cerebellar arteries with large posterior inferior cerebellar arteries bilaterally. Mild irregularity superior cerebellar arteries.    Review of Systems: Patient complains of symptoms per HPI as well as the following symptoms: Fatigue, ringing in ears, runny nose, shortness of breath, snoring, joint pain, back pain, walking difficulty. Pertinent negatives per HPI. All others negative.   Social History   Social History  . Marital status: Married    Spouse name: Joshua [name]   .  Number of children: 2  . Years of education: 15   Occupational History  . Retired      Social History Main Topics  . Smoking status: Never Smoker  . Smokeless tobacco: Never Used  . Alcohol use No  . Drug use: No  . Sexual activity: Not on file   Other Topics Concern  . Not on file   Social History Narrative   Lives with wife.    Caffeine use: Coffee/tea/soda- ocass    Family History  Problem Relation Age of Onset  . Heart attack Father   . Heart disease Father   . Clotting disorder Father   . Heart disease Brother   . Hypertension Mother   . Breast cancer Paternal Grandmother   . Heart attack Paternal Grandfather   . Colon cancer Neg Hx   . Esophageal cancer Neg Hx   . Stomach cancer Neg Hx   . Rectal cancer Neg Hx     Past Medical History:  Diagnosis Date  . Allergic rhinitis   . Allergy   . Anal fissure   . Anxiety    from chronic pain from surgery- on Cymbalta  . Arthritis   . Asthma   . Barrett's esophagus 03/29/2014  . Carotid artery disease ([place])    Carotid Doppler normal August, 2007  . Cataract   . Chest pain    Coronaries normal 1996 /  nuclear..06/2002..normal...EF  56% /  stress echo.. May, 2011.... no  scar or ischemia... rate related RBBB  . Diverticulosis   . Dyslipidemia   . Ejection fraction    EF 60%, stress echo, May, 2011  . GERD (gastroesophageal reflux disease)   . Headache   . History of loop recorder    has since 04/04/15  . HTN (hypertension)    takes Metoprolol for PVC control  . Hx of colonic polyps    adenomatous  . Hx of colonoscopy   . Hyperlipidemia   . IFG (impaired fasting glucose)   . Palpitations    Benign PVCs  . RBBB (right bundle branch block)    rate related  . Shingles   . Sleep apnea    pt denies  . TIA (transient ischemic attack) 02/2015   Per pt, had 2 strokes  . Tremor    Hand tremor  . Vertigo     Past Surgical History:  Procedure Laterality Date  . BACK SURGERY  2002,2009   x 6  . CHOLECYSTECTOMY  11/30/2012   with IOC  . COLONOSCOPY    . ELECTROPHYSIOLOGIC STUDY N/A  09/26/2015   Procedure: V Tach Ablation (PVC);  Surgeon: Will Meredith Leeds, MD;  Location: [place] [facility];  Service: Cardiovascular;  Laterality: N/A;  . EP IMPLANTABLE DEVICE N/A 04/04/2015   Procedure: Loop Recorder Insertion;  Surgeon: Will Meredith Leeds, MD;  Location: [place] [facility];  Service: Cardiovascular;  Laterality: N/A;  . HERNIA REPAIR     laprascopic  . NECK SURGERY  2002  . ROTATOR CUFF REPAIR Left   . TEE WITHOUT CARDIOVERSION N/A 04/04/2015   Procedure: TRANSESOPHAGEAL ECHOCARDIOGRAM (TEE);  Surgeon: Lelon Perla, MD;  Location[facility];  Service: Cardiovascular;  Laterality: N/A;  . UPPER GASTROINTESTINAL ENDOSCOPY      Current Outpatient Prescriptions  Medication Sig Dispense Refill  . albuterol (PROVENTIL HFA;VENTOLIN HFA) 108 (90 BASE) MCG/ACT inhaler Inhale 2 puffs into the lungs every 4 (four) hours as needed for wheezing.     . Cholecalciferol (VITAMIN  D) 2000 units CAPS Take 2,000 Units by mouth daily.    . clopidogrel (PLAVIX) 75 MG tablet Take 1 tablet (75 mg total) by mouth daily. 30 tablet 2  . dicyclomine (BENTYL) 10 MG capsule Take 10 mg by mouth 3 (three) times daily as needed for spasms.    [name] Calcium (STOOL SOFTENER PO) Take 1 capsule by mouth 2 (two) times daily.     . DULoxetine (CYMBALTA) 60 MG capsule Take by mouth daily.     [name] DYMISTA 137-50 MCG/ACT SUSP Place 1 puff into both nostrils at bedtime.     . finasteride (PROSCAR) 5 MG tablet Take 5 mg by mouth Daily.    . fish oil-omega-3 fatty acids 1000 MG capsule Take 1 g by mouth.     . flecainide (TAMBOCOR) 150 MG tablet Take 0.5 tablets (75 mg total) by mouth every 12 (twelve) hours. 60 tablet 1  . lansoprazole (PREVACID) 30 MG capsule Take 30 mg by mouth 2 (two) times daily.    . lansoprazole (PREVACID) 30 MG capsule TAKE ONE CAPSULE BY MOUTH TWICE A DAY 180 capsule 1  . levocetirizine (XYZAL) 5 MG tablet Take 5 mg by mouth every evening.      . metoprolol succinate (TOPROL  XL) 25 MG 24 hr tablet Take 1 tablet (25 mg total) by mouth daily. 30 tablet 12  . montelukast (SINGULAIR) 10 MG tablet Take 10 mg by mouth at bedtime.      . primidone (MYSOLINE) 50 MG tablet Take 2 tablets (100 mg total) by mouth daily. 60 tablet 6  . rosuvastatin (CRESTOR) 10 MG tablet Take 5 mg by mouth daily. Take 1/2 tablet daily    . tamsulosin (FLOMAX) 0.4 MG CAPS capsule Take 0.4 mg by mouth daily after supper.     . zolpidem (AMBIEN) 10 MG tablet Take 10 mg by mouth at bedtime.     No current facility-administered medications for this visit.     Allergies as of 12/03/2015 - Review Complete 12/03/2015  Allergen Reaction Noted  . Floxin [ofloxacin] Other (See Comments) 05/21/2010    Vitals: BP 138/89   Pulse 73   Ht 6\' 1"  (1.854 m)   Wt 229 lb 12.8 oz (104.2 kg)   BMI 30.32 kg/m  Last Weight:  Wt Readings from Last 1 Encounters:  12/03/15 229 lb 12.8 oz (104.2 kg)   Last Height:   Ht Readings from Last 1 Encounters:  12/03/15 6\' 1"  (1.854 m)       Gen: NAD Eyes: anicteric sclerae, moist conjunctivae  CV: no MRG, no carotid bruits, no peripheral edema Mental Status: Alert, follows commands, good historian, oriented to person, date, place  Neuro: Detailed Neurologic Exam  Speech:  No aphasia, no dysarthria  Cranial Nerves:  The pupils are equal, round, and reactive to light.. EOMI. No gaze preference. Facial sensation intact and the muscles of mastication are normal. Visual fields full. Face symmetric, Tongue midline. Hearing intact to voice. Shoulder shrug intact  Motor Observation: Decreased fine motor movements of the right hand. Fine postural/action tremor bilateral. Right hand tremor worsens with pronation/positional as well.   Strength:  5/5 including bilateral hand grip   Sensation:  Intact to LT and pin prick  Plantars downgoing.   ASSESSMENT/PLAN Mr. Priyam Woodards is a 70 y.o. male with Past medical history  of vertigo, right bundle branch block, tremor, impaired fasting glucose, asthma, anxiety, hyperlipidemia, hypertension, headache. Patient was admitted to [facility] on 03/05/2015 with acute  onset of headache after waking up followed by acute onset right hand weakness at 9 AM. He also experienced facial drooping. No vision or speech changes. NIH stroke scale was 1 He did not receive IV t-PA due to minimal deficits.  Essential tremor: He also is here today for essential tremor. Does appear to be essential tremor, has had it since 30s and has a Fhx. Right hand also has a positional tremor with pronation, may have a component of a dystonic tremor will increase primidone and monitor. Discussed side effects including sedation, increased risk of falls.   As far as your medications are concerned, I would like to suggest: Increase primidone to 75mg  for 2 weeks then 100mg  (1.5 pills and then 2 pills) at bedtime  I would like to see you back in 3-4 months, sooner if we need to. Please call us with any interim questions, concerns, problems, updates or refill requests.    Strokes: Dominant infarcts possibly embolic from an unknown etiology.  Resultant decreased fine motor movements of the right hand.  MRI - Tiny acute nonhemorrhagic infarcts left motor strip and left postcentral gyrus.  MRA - anterior circulation without medium or large size vessel significant stenosis or occlusion.  Carotid Doppler - There is no obvious evidence of hemodynamically significant internal carotid artery stenosis bilaterally. The right vertebral artery is patent with antegrade flow. Unable to visualize the left vertebral artery.  2D Echo - EF 50-55%. No cardiac source of emboli identified.  TEE and loop outpatient completed. If AFib then recommend anticoagulation.  LDL - 75 continue Crestor.  HgbA1c- 5.7 advised diet and exercise.  aspirin 81 mg daily prior to admission, Changed to Plavix  Patient counseled to be  compliant with his antithrombotic medications  Ongoing aggressive stroke risk factor management with primary care including management of blood pressure  Cc: Marton Redwood, MD   Sarina Ill, MD  [facility] [address] [place], [place] [address]  Phone [phone] Fax [phone]  A total of 30 minutes was spent face-to-face with this patient. Over half this time was spent on counseling patient on the embolic left MCA stroke, essential tremor diagnosis and different diagnostic and therapeutic options available.

## 2015-12-04 DIAGNOSIS — S83242A Other tear of medial meniscus, current injury, left knee, initial encounter: Secondary | ICD-10-CM | POA: Diagnosis not present

## 2015-12-04 DIAGNOSIS — M23322 Other meniscus derangements, posterior horn of medial meniscus, left knee: Secondary | ICD-10-CM | POA: Diagnosis not present

## 2015-12-04 DIAGNOSIS — M23262 Derangement of other lateral meniscus due to old tear or injury, left knee: Secondary | ICD-10-CM | POA: Diagnosis not present

## 2015-12-04 DIAGNOSIS — G8918 Other acute postprocedural pain: Secondary | ICD-10-CM | POA: Diagnosis not present

## 2015-12-04 DIAGNOSIS — M23362 Other meniscus derangements, other lateral meniscus, left knee: Secondary | ICD-10-CM | POA: Diagnosis not present

## 2015-12-04 DIAGNOSIS — M23352 Other meniscus derangements, posterior horn of lateral meniscus, left knee: Secondary | ICD-10-CM | POA: Diagnosis not present

## 2015-12-04 DIAGNOSIS — M23232 Derangement of other medial meniscus due to old tear or injury, left knee: Secondary | ICD-10-CM | POA: Diagnosis not present

## 2015-12-04 DIAGNOSIS — S83282A Other tear of lateral meniscus, current injury, left knee, initial encounter: Secondary | ICD-10-CM | POA: Diagnosis not present

## 2015-12-04 DIAGNOSIS — M94262 Chondromalacia, left knee: Secondary | ICD-10-CM | POA: Diagnosis not present

## 2015-12-04 DIAGNOSIS — M23332 Other meniscus derangements, other medial meniscus, left knee: Secondary | ICD-10-CM | POA: Diagnosis not present

## 2015-12-06 DIAGNOSIS — G250 Essential tremor: Secondary | ICD-10-CM | POA: Insufficient documentation

## 2015-12-12 ENCOUNTER — Ambulatory Visit (AMBULATORY_SURGERY_CENTER): Payer: Self-pay | Admitting: *Deleted

## 2015-12-12 ENCOUNTER — Encounter: Payer: Self-pay | Admitting: Internal Medicine

## 2015-12-12 VITALS — Ht 73.0 in | Wt 226.0 lb

## 2015-12-12 DIAGNOSIS — S83242D Other tear of medial meniscus, current injury, left knee, subsequent encounter: Secondary | ICD-10-CM | POA: Diagnosis not present

## 2015-12-12 DIAGNOSIS — S83282D Other tear of lateral meniscus, current injury, left knee, subsequent encounter: Secondary | ICD-10-CM | POA: Diagnosis not present

## 2015-12-12 DIAGNOSIS — Z86010 Personal history of colonic polyps: Secondary | ICD-10-CM

## 2015-12-12 MED ORDER — NA SULFATE-K SULFATE-MG SULF 17.5-3.13-1.6 GM/177ML PO SOLN: 1.0000 | Freq: Once | ORAL | 0 refills | Status: AC

## 2015-12-12 NOTE — Progress Notes (Signed)
No egg or soy allergy known to patient  No issues with past sedation with any surgeries  or procedures, no intubation problems  No diet pills per patient No home 02 use per patient  Takes blood thinners per patient - pt on plavix- hold letter x 5 days per Dr Jaynee Eagles in epic- pt aware - pt to start 81 mg ASA while off plavix  Pt denies issues with constipation  No  A fib or A flutter  Hx of Vtach and PVC's

## 2015-12-25 DIAGNOSIS — M180 Bilateral primary osteoarthritis of first carpometacarpal joints: Secondary | ICD-10-CM | POA: Diagnosis not present

## 2015-12-25 DIAGNOSIS — M79642 Pain in left hand: Secondary | ICD-10-CM | POA: Diagnosis not present

## 2015-12-25 DIAGNOSIS — M79641 Pain in right hand: Secondary | ICD-10-CM | POA: Diagnosis not present

## 2015-12-26 ENCOUNTER — Encounter: Payer: Self-pay | Admitting: Internal Medicine

## 2015-12-26 ENCOUNTER — Ambulatory Visit (AMBULATORY_SURGERY_CENTER): Payer: Medicare HMO | Admitting: Internal Medicine

## 2015-12-26 VITALS — BP 122/90 | HR 70 | Temp 99.1°F | Resp 11 | Ht 73.0 in | Wt 226.0 lb

## 2015-12-26 DIAGNOSIS — K921 Melena: Secondary | ICD-10-CM | POA: Diagnosis not present

## 2015-12-26 DIAGNOSIS — D125 Benign neoplasm of sigmoid colon: Secondary | ICD-10-CM | POA: Diagnosis not present

## 2015-12-26 DIAGNOSIS — I69998 Other sequelae following unspecified cerebrovascular disease: Secondary | ICD-10-CM | POA: Diagnosis not present

## 2015-12-26 DIAGNOSIS — D123 Benign neoplasm of transverse colon: Secondary | ICD-10-CM

## 2015-12-26 DIAGNOSIS — G4733 Obstructive sleep apnea (adult) (pediatric): Secondary | ICD-10-CM | POA: Diagnosis not present

## 2015-12-26 DIAGNOSIS — K219 Gastro-esophageal reflux disease without esophagitis: Secondary | ICD-10-CM | POA: Diagnosis not present

## 2015-12-26 DIAGNOSIS — M6281 Muscle weakness (generalized): Secondary | ICD-10-CM | POA: Diagnosis not present

## 2015-12-26 DIAGNOSIS — Z86010 Personal history of colonic polyps: Secondary | ICD-10-CM

## 2015-12-26 DIAGNOSIS — K635 Polyp of colon: Secondary | ICD-10-CM

## 2015-12-26 DIAGNOSIS — R194 Change in bowel habit: Secondary | ICD-10-CM | POA: Diagnosis not present

## 2015-12-26 DIAGNOSIS — I10 Essential (primary) hypertension: Secondary | ICD-10-CM | POA: Diagnosis not present

## 2015-12-26 MED ORDER — SODIUM CHLORIDE 0.9 % IV SOLN: 500.0000 mL | INTRAVENOUS | Status: DC

## 2015-12-26 NOTE — Patient Instructions (Signed)
Impression/Recommendations:  Polyp handout given to patient. Diverticulosis handout given to patient. Hemorrhoid handout given to patient.  Resume Plavix today at prior dose.  Repeat colonoscopy in 5 years for surveillance.  YOU HAD AN ENDOSCOPIC PROCEDURE TODAY AT [facility]:   Refer to the procedure report that was given to you for any specific questions about what was found during the examination.  If the procedure report does not answer your questions, please call your gastroenterologist to clarify.  If you requested that your care partner not be given the details of your procedure findings, then the procedure report has been included in a sealed envelope for you to review at your convenience later.  YOU SHOULD EXPECT: Some feelings of bloating in the abdomen. Passage of more gas than usual.  Walking can help get rid of the air that was put into your GI tract during the procedure and reduce the bloating. If you had a lower endoscopy (such as a colonoscopy or flexible sigmoidoscopy) you may notice spotting of blood in your stool or on the toilet paper. If you underwent a bowel prep for your procedure, you may not have a normal bowel movement for a few days.  Please Note:  You might notice some irritation and congestion in your nose or some drainage.  This is from the oxygen used during your procedure.  There is no need for concern and it should clear up in a day or so.  SYMPTOMS TO REPORT IMMEDIATELY:   Following lower endoscopy (colonoscopy or flexible sigmoidoscopy):  Excessive amounts of blood in the stool  Significant tenderness or worsening of abdominal pains  Swelling of the abdomen that is new, acute  Fever of 100F or higher For urgent or emergent issues, a gastroenterologist can be reached at any hour by calling [phone].   DIET:  We do recommend a small meal at first, but then you may proceed to your regular diet.  Drink plenty of fluids but you should  avoid alcoholic beverages for 24 hours.  ACTIVITY:  You should plan to take it easy for the rest of today and you should NOT DRIVE or use heavy machinery until tomorrow (because of the sedation medicines used during the test).    FOLLOW UP: Our staff will call the number listed on your records the next business day following your procedure to check on you and address any questions or concerns that you may have regarding the information given to you following your procedure. If we do not reach you, we will leave a message.  However, if you are feeling well and you are not experiencing any problems, there is no need to return our call.  We will assume that you have returned to your regular daily activities without incident.  If any biopsies were taken you will be contacted by phone or by letter within the next 1-3 weeks.  Please call us at [phone] if you have not heard about the biopsies in 3 weeks.    SIGNATURES/CONFIDENTIALITY: You and/or your care partner have signed paperwork which will be entered into your electronic medical record.  These signatures attest to the fact that that the information above on your After Visit Summary has been reviewed and is understood.  Full responsibility of the confidentiality of this discharge information lies with you and/or your care-partner.

## 2015-12-26 NOTE — Progress Notes (Signed)
Report to PACU, RN, vss, BBS= Clear.  

## 2015-12-26 NOTE — Progress Notes (Signed)
Called to room to assist during endoscopic procedure.  Patient ID and intended procedure confirmed with present staff. Received instructions for my participation in the procedure from the performing physician.  

## 2015-12-26 NOTE — Op Note (Signed)
[facility] Patient Name: Joshua [name] Procedure Date: 12/26/2015 10:41 AM MRN: [id number] Endoscopist: Docia Chuck. Henrene Pastor , MD Age: 70 Referring MD:  Date of Birth: 31-Mar-1945 Gender: Male Account #: [id number] Procedure:                Colonoscopy, with cold snare polypectomy X2 Indications:              High risk colon cancer surveillance: Personal                            history of multiple (3 or more) adenomas. Index                            exam 2006 (advanced adenoma) with follow-up 2009                            (negative), 2014 (tubular adenoma). Recently seen                            in the office for left-sided pain and bleeding. All                            symptoms better including bowel habits. Medicines:                Monitored Anesthesia Care Procedure:                Pre-Anesthesia Assessment:                           - Prior to the procedure, a History and Physical                            was performed, and patient medications and                            allergies were reviewed. The patient's tolerance of                            previous anesthesia was also reviewed. The risks                            and benefits of the procedure and the sedation                            options and risks were discussed with the patient.                            All questions were answered, and informed consent                            was obtained. Prior Anticoagulants: The patient has                            taken Plavix (clopidogrel), last dose was 7 days  prior to procedure. ASA Grade Assessment: III - A                            patient with severe systemic disease. After                            reviewing the risks and benefits, the patient was                            deemed in satisfactory condition to undergo the                            procedure.                           After obtaining informed  consent, the colonoscope                            was passed under direct vision. Throughout the                            procedure, the patient's blood pressure, pulse, and                            oxygen saturations were monitored continuously. The                            Model CF-HQ190L [id number]) scope was introduced                            through the anus and advanced to the the cecum,                            identified by appendiceal orifice and ileocecal                            valve. The ileocecal valve, appendiceal orifice,                            and rectum were photographed. The quality of the                            bowel preparation was excellent. The colonoscopy                            was performed without difficulty. The patient                            tolerated the procedure well. The bowel preparation                            used was SUPREP. Scope In: 10:58:47 AM Scope Out: 11:13:14 AM Scope Withdrawal Time: 0 hours 9 minutes 56 seconds  Total Procedure Duration: 0 hours 14 minutes 27 seconds  Findings:                 Two polyps were found in the sigmoid colon and                            transverse colon. The polyps were 2 to 3 mm in                            size. These polyps were removed with a cold snare.                            Resection and retrieval were complete.                           Multiple small and large-mouthed diverticula were                            found in the left colon.                           Internal hemorrhoids were found during                            retroflexion. The hemorrhoids were large.                           The exam was otherwise without abnormality on                            direct and retroflexion views. Complications:            No immediate complications. Estimated blood loss:                            None. Estimated Blood Loss:     Estimated blood loss:  none. Impression:               - Two 2 to 3 mm polyps in the sigmoid colon and in                            the transverse colon, removed with a cold snare.                            Resected and retrieved.                           - Diverticulosis in the left colon.                           - Internal hemorrhoids.                           - The examination was otherwise normal on direct                            and retroflexion views. Recommendation:           -  Repeat colonoscopy in 5 years for surveillance.                           - Resume Plavix (clopidogrel) today at prior dose.                           - Patient has a contact number available for                            emergencies. The signs and symptoms of potential                            delayed complications were discussed with the                            patient. Return to normal activities tomorrow.                            Written discharge instructions were provided to the                            patient.                           - Resume previous diet.                           - Continue present medications.                           - Await pathology results. Docia Chuck. Henrene Pastor, MD 12/26/2015 11:20:05 AM This report has been signed electronically.

## 2015-12-27 ENCOUNTER — Telehealth: Payer: Self-pay

## 2015-12-27 NOTE — Telephone Encounter (Signed)
  Follow up Call-  Call back number 12/26/2015 03/29/2014 08/16/2013  Post procedure Call Back phone  # [phone]  Permission to leave phone message Yes Yes Yes  Some recent data might be hidden     Patient questions:  Do you have a fever, pain , or abdominal swelling? No. Pain Score  0 *  Have you tolerated food without any problems? Yes.    Have you been able to return to your normal activities? Yes.    Do you have any questions about your discharge instructions: Diet   No. Medications  No. Follow up visit  No.  Do you have questions or concerns about your Care? No.  Actions: * If pain score is 4 or above: No action needed, pain <4.

## 2015-12-30 ENCOUNTER — Ambulatory Visit (INDEPENDENT_AMBULATORY_CARE_PROVIDER_SITE_OTHER): Payer: Medicare HMO | Admitting: *Deleted

## 2015-12-30 DIAGNOSIS — I639 Cerebral infarction, unspecified: Secondary | ICD-10-CM | POA: Diagnosis not present

## 2015-12-30 NOTE — Progress Notes (Signed)
Carelink Summary Report / Loop Recorder 

## 2015-12-31 DIAGNOSIS — M79641 Pain in right hand: Secondary | ICD-10-CM | POA: Diagnosis not present

## 2015-12-31 DIAGNOSIS — S83282D Other tear of lateral meniscus, current injury, left knee, subsequent encounter: Secondary | ICD-10-CM | POA: Diagnosis not present

## 2015-12-31 DIAGNOSIS — M79642 Pain in left hand: Secondary | ICD-10-CM | POA: Diagnosis not present

## 2015-12-31 DIAGNOSIS — S83242D Other tear of medial meniscus, current injury, left knee, subsequent encounter: Secondary | ICD-10-CM | POA: Diagnosis not present

## 2015-12-31 DIAGNOSIS — M180 Bilateral primary osteoarthritis of first carpometacarpal joints: Secondary | ICD-10-CM | POA: Diagnosis not present

## 2016-01-02 ENCOUNTER — Encounter: Payer: Self-pay | Admitting: Internal Medicine

## 2016-01-03 ENCOUNTER — Encounter: Payer: Self-pay | Admitting: Neurology

## 2016-01-09 ENCOUNTER — Telehealth: Payer: Self-pay | Admitting: Internal Medicine

## 2016-01-09 ENCOUNTER — Other Ambulatory Visit: Payer: Self-pay | Admitting: Neurology

## 2016-01-09 ENCOUNTER — Other Ambulatory Visit: Payer: Self-pay

## 2016-01-09 MED ORDER — HYDROCORTISONE 2.5 % RE CREA
1.0000 | TOPICAL_CREAM | Freq: Two times a day (BID) | RECTAL | 1 refills | Status: DC
Start: 2016-01-09 — End: 2017-02-12

## 2016-01-09 MED ORDER — PRIMIDONE 50 MG PO TABS: 150.0000 mg | ORAL_TABLET | Freq: Every day | ORAL | 6 refills | Status: DC

## 2016-01-09 NOTE — Telephone Encounter (Signed)
Discussed with the patient. He is in agreement with this plan of care. Medication called to the [facility].

## 2016-01-09 NOTE — Telephone Encounter (Signed)
No answer or voicemail at the home number. Left a message to cal back on his cell phone. Patient had colonoscopy on 12/26/15 with polypectomy.

## 2016-01-09 NOTE — Telephone Encounter (Signed)
It does sound like hemorrhoids are the problem.Chart reviewed - only had 2 small polyps removed w/ cold snare so do not think related to that  Rx 2.5% rectal hydrocortisone cream bid into rectum # 1 tube and 1 RF  If this fails to stop things he should call back.  I would anticipate some relief within several days -   May also use OTC 5% lidocaine e.g. recticare for painful sensations if needed  Further plans per Dr. Henrene Pastor

## 2016-01-09 NOTE — Telephone Encounter (Signed)
Doc of the Day  Patient of Dr Henrene Pastor. Spoke with the patient. Reports this bleeding is not a new problem. It was happening before the colonoscopy, the day of the colonoscopy and again today. He has a bowel movement, then cleans, then the bleeding will start. It is an actual dripping of blood into the toilet. He has to wait extra time for leave the bathroom, waiting for the bleeding to stop. He has a stinging sensation and feels a little uncomfortable. He states he knows he has hemorrhoids and "they are bad." He takes Plavix. He is not using any type of cream or suppository. Please advise. Call patient on his cell phone [phone]

## 2016-01-12 LAB — CUP PACEART REMOTE DEVICE CHECK
Date Time Interrogation Session: 20171118161008
Implantable Pulse Generator Implant Date: 20170323

## 2016-01-12 NOTE — Progress Notes (Signed)
Carelink summary report received. Battery status OK. Normal device function. No new symptom episodes, tachy episodes, brady, or pause episodes. No new AF episodes. Monthly summary reports and ROV/PRN 

## 2016-01-14 ENCOUNTER — Other Ambulatory Visit: Payer: Self-pay | Admitting: Nurse Practitioner

## 2016-01-22 ENCOUNTER — Telehealth: Payer: Self-pay | Admitting: Internal Medicine

## 2016-01-22 NOTE — Telephone Encounter (Signed)
Pt states he is having problems with his external hemorrhoids. Explained that banding works on internal ones and for external hemorrhoids he would need to see a Psychologist, sport and exercise. Pt would like a surgical referral for the external hemorrhoids. Please advise.

## 2016-01-23 NOTE — Telephone Encounter (Signed)
Pt is c/o of the external hemorrhoids. Still set up with Dr. Hilarie Fredrickson or [facility] referral? Please advise.

## 2016-01-23 NOTE — Telephone Encounter (Signed)
He does have significant internal hemorrhoids. Please have him see Dr. Hilarie Fredrickson for probable banding. Thanks

## 2016-01-24 NOTE — Telephone Encounter (Signed)
Referral faxed to [facility] for appt with Dr. Marcello Moores.

## 2016-01-24 NOTE — Telephone Encounter (Signed)
He may have external hemorrhoids and/or prolapsing internal hemorrhoids would be my concern. If he prefers, he can see a Education officer, environmental. Send him to Dr. Leighton Ruff

## 2016-01-28 DIAGNOSIS — S83242D Other tear of medial meniscus, current injury, left knee, subsequent encounter: Secondary | ICD-10-CM | POA: Diagnosis not present

## 2016-01-28 DIAGNOSIS — S83282D Other tear of lateral meniscus, current injury, left knee, subsequent encounter: Secondary | ICD-10-CM | POA: Diagnosis not present

## 2016-01-29 ENCOUNTER — Ambulatory Visit (INDEPENDENT_AMBULATORY_CARE_PROVIDER_SITE_OTHER): Payer: Medicare HMO | Admitting: *Deleted

## 2016-01-29 DIAGNOSIS — I639 Cerebral infarction, unspecified: Secondary | ICD-10-CM | POA: Diagnosis not present

## 2016-01-31 NOTE — Progress Notes (Signed)
Carelink Summary Report / Loop Recorder 

## 2016-02-12 DIAGNOSIS — G039 Meningitis, unspecified: Secondary | ICD-10-CM | POA: Diagnosis not present

## 2016-02-12 DIAGNOSIS — M4716 Other spondylosis with myelopathy, lumbar region: Secondary | ICD-10-CM | POA: Diagnosis not present

## 2016-02-13 DIAGNOSIS — K641 Second degree hemorrhoids: Secondary | ICD-10-CM | POA: Diagnosis not present

## 2016-02-13 DIAGNOSIS — Z7901 Long term (current) use of anticoagulants: Secondary | ICD-10-CM | POA: Diagnosis not present

## 2016-02-17 LAB — CUP PACEART REMOTE DEVICE CHECK
Date Time Interrogation Session: 20171218171227
Implantable Pulse Generator Implant Date: 20170323

## 2016-02-18 ENCOUNTER — Other Ambulatory Visit: Payer: Self-pay | Admitting: Neurosurgery

## 2016-02-18 DIAGNOSIS — M4716 Other spondylosis with myelopathy, lumbar region: Secondary | ICD-10-CM

## 2016-02-26 ENCOUNTER — Ambulatory Visit
Admission: RE | Admit: 2016-02-26 | Discharge: 2016-02-26 | Disposition: A | Discharge status: Home / Self Care | Payer: Medicare HMO | Source: Ambulatory Visit | Attending: Neurosurgery | Admitting: Neurosurgery

## 2016-02-26 DIAGNOSIS — M79641 Pain in right hand: Secondary | ICD-10-CM | POA: Diagnosis not present

## 2016-02-26 DIAGNOSIS — M180 Bilateral primary osteoarthritis of first carpometacarpal joints: Secondary | ICD-10-CM | POA: Diagnosis not present

## 2016-02-26 DIAGNOSIS — M4716 Other spondylosis with myelopathy, lumbar region: Secondary | ICD-10-CM

## 2016-02-26 DIAGNOSIS — M5126 Other intervertebral disc displacement, lumbar region: Secondary | ICD-10-CM | POA: Diagnosis not present

## 2016-02-26 DIAGNOSIS — M79642 Pain in left hand: Secondary | ICD-10-CM | POA: Diagnosis not present

## 2016-02-28 ENCOUNTER — Ambulatory Visit (INDEPENDENT_AMBULATORY_CARE_PROVIDER_SITE_OTHER): Payer: Medicare HMO | Admitting: *Deleted

## 2016-02-28 DIAGNOSIS — I639 Cerebral infarction, unspecified: Secondary | ICD-10-CM

## 2016-03-02 NOTE — Progress Notes (Signed)
Carelink Summary Report / Loop Recorder 

## 2016-03-07 LAB — CUP PACEART REMOTE DEVICE CHECK
Date Time Interrogation Session: 20180117170718
Implantable Pulse Generator Implant Date: 20170323

## 2016-03-11 ENCOUNTER — Encounter: Payer: Self-pay | Admitting: Neurology

## 2016-03-11 ENCOUNTER — Ambulatory Visit (INDEPENDENT_AMBULATORY_CARE_PROVIDER_SITE_OTHER): Payer: Medicare HMO | Admitting: Neurology

## 2016-03-11 VITALS — BP 113/72 | HR 75 | Ht 73.0 in | Wt 235.4 lb

## 2016-03-11 DIAGNOSIS — G250 Essential tremor: Secondary | ICD-10-CM

## 2016-03-11 MED ORDER — GABAPENTIN (ONCE-DAILY) 300 & 600 MG PO MISC: ORAL | Status: DC

## 2016-03-11 NOTE — Progress Notes (Addendum)
[facility]    Provider:  Dr Joshua [name] Referring Provider: Marton Redwood, MD Primary Care Physician:  Joshua Redwood, MD    CC: Left MCA stroke  Interval history 03/11/2016: Tried increasing the primidone and felt terrible on it. He is feeling trembling all over even in the body. Discussed other medication, decided on trying gabapentin.  Interval history 12/03/2015:  New problem, essential tremor  Continues to have right hand weakness, but has been improving after the stroke. Has tremor on both hands, this has been going on even before his stroke since his 70's. R hand is more than the left. It's getting worse gradually Tremor is worse with activity. He drops things from his right hand. He is able to stop the tremor temporarily but continues as soon as he is distracted. His brother has a tremor as well. Tremor is positional in the right hand. Can't write name anymore, More with action. He doesn't notice a chin tremor. Primidone 50mg  daily is not helping.   [id number] Joshua [name] a 71 y.o.malehere as a referral from Joshua [name] embolic stroke. Past medical history of vertigo, right bundle branch block, tremor, impaired fasting glucose, asthma, anxiety, hyperlipidemia, hypertension, headache. Patient was admitted to [facility] on 03/05/2015 with acute onset of headache after waking up followed by acute onset right hand weakness at 9 AM. He also experienced facial drooping. No vision or speech changes. NIH stroke scale was 1 and rapidly improving and IV tPA was not administered. Patient returns today for follow-up. Still having some difficulty with fine motor movements of the right hand. He is having shortness of breath and is following with cardiology with recent TEE and loop recorder placement. He is very winded and can't catch his breath. The episodes are discrete, by the time he gets up the steps he can be struggling for air. Always when he first starts to walk. But  symptoms also can be after he is walking for an extended period of time. No new stroke symptoms, no new weakness, denies dysarthria, dysphagia, aphasia. Reviewed images of the brain with patient and his wife again today. Answered questions about his stroke.Discussion with patient and wife at regarding his strokes which appear to be embolic from an unknown source. Echo and carotid Dopplers have been unrevealing. Recommend outpatient transesophageal echocardiogram and possible loop recorder. If atrial fibrillation is found do recommend anticoagulation. The patient is aware of atrial fibrillation and the risks of stroke because his twin Brother has atrial fibrillation and is on anticoagulation.  Reviewed notes, labs and imaging from outside physicians, which showed:  Personally reviewed images and agree with the following:  Ct Head Wo Contrast 03/05/2015 No acute intracranial abnormality.   Mr Joshua [name] Head Wo Contrast 03/05/2015  MRI HEAD  Tiny acute nonhemorrhagic infarcts left motor strip and left postcentral gyrus.Moderate patchy and punctate white matter changes most notable periventricular region consistent with result of chronic microvascular changes. Mild global atrophy without hydrocephalus. Mild exophthalmos. Degenerative changes occiput-C1 junction.  MRA HEAD  Anterior circulation without medium or large size vessel significant stenosis or occlusion. Mild middle cerebral artery branch vessel irregularity bilaterally. Slight irregularity distal vertebral arteries without significant narrowing. Nonvisualized anterior inferior cerebellar arteries with large posterior inferior cerebellar arteries bilaterally. Mild irregularity superior cerebellar arteries.    Review of Systems: Patient complains of symptoms per HPI as well as the following symptoms: Fatigue, ringing in ears, runny nose, shortness of breath, snoring, joint pain, back pain, walking difficulty. Pertinent negatives per  HPI. All  others negative.   Social History   Social History  . Marital status: Married    Spouse name: Joshua [name]   . Number of children: 2  . Years of education: 15   Occupational History  . Retired    Social History Main Topics  . Smoking status: Never Smoker  . Smokeless tobacco: Never Used  . Alcohol use No  . Drug use: No  . Sexual activity: Not on file   Other Topics Concern  . Not on file   Social History Narrative   Lives with wife.    Caffeine use: Coffee/tea/soda- ocass   Right-handed    Family History  Problem Relation Age of Onset  . Heart attack Father   . Heart disease Father   . Clotting disorder Father   . Heart disease Brother   . Hypertension Mother   . Breast cancer Paternal Grandmother   . Heart attack Paternal Grandfather   . Colon cancer Neg Hx   . Esophageal cancer Neg Hx   . Stomach cancer Neg Hx   . Rectal cancer Neg Hx   . Colon polyps Neg Hx     Past Medical History:  Diagnosis Date  . Allergic rhinitis   . Allergy   . Anal fissure   . Anxiety    from chronic pain from surgery- on Cymbalta  . Arthritis   . Asthma   . Barrett's esophagus 03/29/2014  . Carotid artery disease ([place])    Carotid Doppler normal August, 2007  . Cataract   . Chest pain    Coronaries normal 1996 /  nuclear..06/2002..normal...EF  56% /  stress echo.. May, 2011.... no  scar or ischemia... rate related RBBB  . Diverticulosis   . Dyslipidemia   . Ejection fraction    EF 60%, stress echo, May, 2011  . GERD (gastroesophageal reflux disease)   . Headache   . History of loop recorder    has since 04/04/15  . HTN (hypertension)    takes Metoprolol for PVC control  . Hx of colonic polyps    adenomatous  . Hx of colonoscopy   . Hyperlipidemia   . IFG (impaired fasting glucose)   . Palpitations    Benign PVCs  . RBBB (right bundle branch block)    rate related  . Shingles   . Sleep apnea    pt denies  . Stroke [facility])    TIA  . TIA (transient ischemic  attack) 02/2015   Per pt, had 2 strokes  . Tremor    Hand tremor  . Vertigo     Past Surgical History:  Procedure Laterality Date  . BACK SURGERY  2002,2009   x 6  . CHOLECYSTECTOMY  11/30/2012   with IOC  . COLONOSCOPY    . ELECTROPHYSIOLOGIC STUDY N/A 09/26/2015   Procedure: V Tach Ablation (PVC);  Surgeon: Will Meredith Leeds, MD;  Location: [facility];  Service: Cardiovascular;  Laterality: N/A;  . EP IMPLANTABLE DEVICE N/A 04/04/2015   Procedure: Loop Recorder Insertion;  Surgeon: Will Meredith Leeds, MD;  Location: [facility];  Service: Cardiovascular;  Laterality: N/A;  . HERNIA REPAIR     laprascopic  . KNEE SURGERY     DECEMBER 2017,LEFT KNEE SCOPED  . NECK SURGERY  2002  . POLYPECTOMY    . ROTATOR CUFF REPAIR Left   . TEE WITHOUT CARDIOVERSION N/A 04/04/2015   Procedure: TRANSESOPHAGEAL ECHOCARDIOGRAM (TEE);  Surgeon: Lelon Perla, MD;  Location: [facility]  ENDOSCOPY;  Service: Cardiovascular;  Laterality: N/A;  . UPPER GASTROINTESTINAL ENDOSCOPY    . V Tach ablation  09/26/2015    Current Outpatient Prescriptions  Medication Sig Dispense Refill  . albuterol (PROVENTIL HFA;VENTOLIN HFA) 108 (90 BASE) MCG/ACT inhaler Inhale 2 puffs into the lungs every 4 (four) hours as needed for wheezing.     . Cholecalciferol (VITAMIN D) 2000 units CAPS Take 2,000 Units by mouth daily.    . clopidogrel (PLAVIX) 75 MG tablet Take 1 tablet (75 mg total) by mouth daily. 30 tablet 2  . dicyclomine (BENTYL) 10 MG capsule Take 10 mg by mouth 3 (three) times daily as needed for spasms.    [name] Calcium (STOOL SOFTENER PO) Take 1 capsule by mouth 2 (two) times daily.     . DULoxetine (CYMBALTA) 60 MG capsule Take by mouth daily.     [name] DYMISTA 137-50 MCG/ACT SUSP Place 1 puff into both nostrils at bedtime.     . finasteride (PROSCAR) 5 MG tablet Take 5 mg by mouth Daily.    . fish oil-omega-3 fatty acids 1000 MG capsule Take 1 g by mouth.     . flecainide (TAMBOCOR) 150 MG  tablet TAKE 1/2 TABLET BY MOUTH EVERY 12 HOURS 60 tablet 1  . hydrocortisone (ANUSOL-HC) 2.5 % rectal cream Place 1 application rectally 2 (two) times daily. 30 g 1  . lansoprazole (PREVACID) 30 MG capsule TAKE ONE CAPSULE BY MOUTH TWICE A DAY 180 capsule 1  . levocetirizine (XYZAL) 5 MG tablet Take 5 mg by mouth every evening.      . metoprolol succinate (TOPROL XL) 25 MG 24 hr tablet Take 1 tablet (25 mg total) by mouth daily. 30 tablet 12  . montelukast (SINGULAIR) 10 MG tablet Take 10 mg by mouth at bedtime.      . primidone (MYSOLINE) 50 MG tablet Take 3 tablets (150 mg total) by mouth daily. (Patient taking differently: Take 100 mg by mouth daily. ) 90 tablet 6  . rosuvastatin (CRESTOR) 10 MG tablet Take 5 mg by mouth daily. Take 1/2 tablet daily    . tamsulosin (FLOMAX) 0.4 MG CAPS capsule Take 0.4 mg by mouth daily after supper.     . zolpidem (AMBIEN) 10 MG tablet Take 10 mg by mouth at bedtime.     Current Facility-Administered Medications  Medication Dose Route Frequency Provider Last Rate Last Dose  . 0.9 %  sodium chloride infusion  500 mL Intravenous Continuous Irene Shipper, MD        Allergies as of 03/11/2016 - Review Complete 03/11/2016  Allergen Reaction Noted  . Floxin [ofloxacin] Other (See Comments) 05/21/2010    Vitals: BP 113/72   Pulse 75   Ht 6\' 1"  (1.854 m)   Wt 235 lb 6.4 oz (106.8 kg)   BMI 31.06 kg/m  Last Weight:  Wt Readings from Last 1 Encounters:  03/11/16 235 lb 6.4 oz (106.8 kg)   Last Height:   Ht Readings from Last 1 Encounters:  03/11/16 6\' 1"  (1.854 m)     Gen: NAD Eyes: anicteric sclerae, moist conjunctivae CV: no MRG, no carotid bruits, no peripheral edema Mental Status: Alert, follows commands, good historian, oriented to person, date, place  Neuro: Detailed Neurologic Exam  Speech: No aphasia, no dysarthria  Cranial Nerves: The pupils are equal, round, and reactive to light.. EOMI. No gaze  preference. Facial sensation intact and the muscles of mastication are normal. Visual fields full. Face symmetric, Tongue midline.  Hearing intact to voice. Shoulder shrug intact  Motor Observation: Decreased fine motor movements of the right hand. Fine postural/action tremor bilateral. Right hand tremor worsens with pronation/positional as well.  Strength: 5/5 including bilateral hand grip  Sensation:  Intact to LT and pin prick  Plantars downgoing.   ASSESSMENT/PLAN Mr. Mekhai Gaymon is a 71 y.o. male with Past medical history of vertigo, right bundle branch block, tremor, impaired fasting glucose, asthma, anxiety, hyperlipidemia, hypertension, headache.Patient was admitted to [facility] on 03/05/2015 with acute onset of headache after waking up followed by acute onset right hand weakness at 9 AM. He also experienced facial drooping. No vision or speech changes. NIH stroke scale was 1 He did not receive IV t-PA due to minimal deficits.  Essential tremor: He also is here today for essential tremor. Does appear to be essential tremor, has had it since 30s and has a Fhx. Right hand also has a positional tremor with pronation, may have a component of a dystonic tremor will increase primidone and monitor. Discussed side effects including sedation, increased risk of falls.   - Start gabapentin - Hold primidone dose.  - Deep brain stimulation discussed    I would like to see you back in 3-4 months, sooner if we need to. Please call us with any interim questions, concerns, problems, updates or refill requests.    Strokes: Dominant infarcts possibly embolic from an unknown etiology.  Resultantdecreased fine motor movements of the right hand.  MRI - Tiny acute nonhemorrhagic infarcts left motor strip and left postcentral gyrus.  MRA- anterior circulation without medium or large size vessel significant stenosis or occlusion.  Carotid Doppler - There is no obvious  evidence of hemodynamically significant internal carotid artery stenosis bilaterally. The right vertebral artery is patent with antegrade flow. Unable to visualize the left vertebral artery.  2D Echo - EF 50-55%. No cardiac source of emboli identified.  TEE and loop outpatient completed. If AFib then recommend anticoagulation.  LDL- 75 continue Crestor.  HgbA1c- 5.7 advised diet and exercise.  aspirin 81 mg daily prior to admission, Changed to Plavix  Patient counseled to be compliant with his antithrombotic medications  Ongoing aggressive stroke risk factor management with primary care including management of blood pressure  Cc: Joshua Redwood, MD   Sarina Ill, MD  [facility] [address] [place], [place] [address]  Phone [phone] Fax [phone]  A total of 15 minutes was spent face-to-face with this patient. Over half this time was spent on counseling patient on the embolic left MCA stroke, essential tremor diagnosis and different diagnostic and therapeutic options available.

## 2016-03-11 NOTE — Addendum Note (Signed)
Addended by: Sarina Ill B on: 03/11/2016 07:54 PM   Modules accepted: Orders

## 2016-03-11 NOTE — Patient Instructions (Signed)
Gabapentin capsules or tablets What is this medicine? GABAPENTIN (GA ba pen tin) is used to control partial seizures in adults with epilepsy. It is also used to treat certain types of nerve pain. This medicine may be used for other purposes; ask your health care provider or pharmacist if you have questions. COMMON BRAND NAME(S): Active-PAC with Gabapentin, Gabarone, Neurontin What should I tell my health care provider before I take this medicine? They need to know if you have any of these conditions: -kidney disease -suicidal thoughts, plans, or attempt; a previous suicide attempt by you or a family member -an unusual or allergic reaction to gabapentin, other medicines, foods, dyes, or preservatives -pregnant or trying to get pregnant -breast-feeding How should I use this medicine? Take this medicine by mouth with a glass of water. Follow the directions on the prescription label. You can take it with or without food. If it upsets your stomach, take it with food.Take your medicine at regular intervals. Do not take it more often than directed. Do not stop taking except on your doctor's advice. If you are directed to break the 600 or 800 mg tablets in half as part of your dose, the extra half tablet should be used for the next dose. If you have not used the extra half tablet within 28 days, it should be thrown away. A special MedGuide will be given to you by the pharmacist with each prescription and refill. Be sure to read this information carefully each time. Talk to your pediatrician regarding the use of this medicine in children. Special care may be needed. Overdosage: If you think you have taken too much of this medicine contact a poison control center or emergency room at once. NOTE: This medicine is only for you. Do not share this medicine with others. What if I miss a dose? If you miss a dose, take it as soon as you can. If it is almost time for your next dose, take only that dose. Do not  take double or extra doses. What may interact with this medicine? Do not take this medicine with any of the following medications: -other gabapentin products This medicine may also interact with the following medications: -alcohol -antacids -antihistamines for allergy, cough and cold -certain medicines for anxiety or sleep -certain medicines for depression or psychotic disturbances -homatropine; hydrocodone -naproxen -narcotic medicines (opiates) for pain -phenothiazines like chlorpromazine, mesoridazine, prochlorperazine, thioridazine This list may not describe all possible interactions. Give your health care provider a list of all the medicines, herbs, non-prescription drugs, or dietary supplements you use. Also tell them if you smoke, drink alcohol, or use illegal drugs. Some items may interact with your medicine. What should I watch for while using this medicine? Visit your doctor or health care professional for regular checks on your progress. You may want to keep a record at home of how you feel your condition is responding to treatment. You may want to share this information with your doctor or health care professional at each visit. You should contact your doctor or health care professional if your seizures get worse or if you have any new types of seizures. Do not stop taking this medicine or any of your seizure medicines unless instructed by your doctor or health care professional. Stopping your medicine suddenly can increase your seizures or their severity. Wear a medical identification bracelet or chain if you are taking this medicine for seizures, and carry a card that lists all your medications. You may get drowsy, dizzy,   or have blurred vision. Do not drive, use machinery, or do anything that needs mental alertness until you know how this medicine affects you. To reduce dizzy or fainting spells, do not sit or stand up quickly, especially if you are an older patient. Alcohol can  increase drowsiness and dizziness. Avoid alcoholic drinks. Your mouth may get dry. Chewing sugarless gum or sucking hard candy, and drinking plenty of water will help. The use of this medicine may increase the chance of suicidal thoughts or actions. Pay special attention to how you are responding while on this medicine. Any worsening of mood, or thoughts of suicide or dying should be reported to your health care professional right away. Women who become pregnant while using this medicine may enroll in the [place] American Antiepileptic Drug Pregnancy Registry by calling 1-888-233-2334. This registry collects information about the safety of antiepileptic drug use during pregnancy. What side effects may I notice from receiving this medicine? Side effects that you should report to your doctor or health care professional as soon as possible: -allergic reactions like skin rash, itching or hives, swelling of the face, lips, or tongue -worsening of mood, thoughts or actions of suicide or dying Side effects that usually do not require medical attention (report to your doctor or health care professional if they continue or are bothersome): -constipation -difficulty walking or controlling muscle movements -dizziness -nausea -slurred speech -tiredness -tremors -weight gain This list may not describe all possible side effects. Call your doctor for medical advice about side effects. You may report side effects to FDA at 1-800-FDA-1088. Where should I keep my medicine? Keep out of reach of children. This medicine may cause accidental overdose and death if it taken by other adults, children, or pets. Mix any unused medicine with a substance like cat litter or coffee grounds. Then throw the medicine away in a sealed container like a sealed bag or a coffee can with a lid. Do not use the medicine after the expiration date. Store at room temperature between 15 and 30 degrees C (59 and 86 degrees F). NOTE: This  sheet is a summary. It may not cover all possible information. If you have questions about this medicine, talk to your doctor, pharmacist, or health care provider.  2018 Elsevier/Gold Standard (2013-02-24 15:26:50)  

## 2016-03-18 LAB — CUP PACEART REMOTE DEVICE CHECK
Date Time Interrogation Session: 20180216171348
Implantable Pulse Generator Implant Date: 20170323

## 2016-03-19 ENCOUNTER — Telehealth: Payer: Self-pay | Admitting: Internal Medicine

## 2016-03-20 DIAGNOSIS — G039 Meningitis, unspecified: Secondary | ICD-10-CM | POA: Diagnosis not present

## 2016-03-20 DIAGNOSIS — M4716 Other spondylosis with myelopathy, lumbar region: Secondary | ICD-10-CM | POA: Diagnosis not present

## 2016-03-20 NOTE — Telephone Encounter (Signed)
Faxed documents to [facility] to get approval for Prevacid twice a day

## 2016-03-23 ENCOUNTER — Encounter: Payer: Self-pay | Admitting: Neurology

## 2016-03-23 ENCOUNTER — Other Ambulatory Visit: Payer: Self-pay | Admitting: Neurosurgery

## 2016-03-23 DIAGNOSIS — M4716 Other spondylosis with myelopathy, lumbar region: Secondary | ICD-10-CM

## 2016-03-24 ENCOUNTER — Telehealth: Payer: Self-pay | Admitting: Neurology

## 2016-03-24 NOTE — Telephone Encounter (Signed)
Received permission form from GI to stop Plavix x 5 days prior to procedure, awaiting MD review/signature.

## 2016-03-24 NOTE — Telephone Encounter (Signed)
Patient said [facility] sent a fax re: him stopping his Plavix for 5 days for the injection that he is supposed to have. He is asking for a call back

## 2016-03-25 ENCOUNTER — Telehealth: Payer: Self-pay | Admitting: Cardiology

## 2016-03-25 NOTE — Telephone Encounter (Signed)
Pt been having tremors for some time now.  For last 6/7 months they have been worsening, since CVA.  Right arm > left. Neurology is trying to see what drug combinations might reduce/help tremors.  They are suggesting trying switching BB to see if this also helps. Pt understands Dr. Curt Bears is out of town until next week, but made aware I would review this with him Monday and call him w/ recommendation/s. Pt is agreeable to plan.

## 2016-03-25 NOTE — Telephone Encounter (Addendum)
Signed and faxed back w/ Dr. Cathren Laine recommendation to take aspirin 325 mg while off of Plavix. Pt notified via [place].

## 2016-03-25 NOTE — Telephone Encounter (Signed)
New message   Pt c/o medication issue:  1. Name of Medication: Metoprolol   2. How are you currently taking this medication (dosage and times per day)? Once daily  3. Are you having a reaction (difficulty breathing--STAT)? Pt state he has been having a tremor in his right hand  4. What is your medication issue? Pt states he saw his neurologist about his tremor and he suggested pt ask if he could start propranolol instead of metoprolol

## 2016-03-26 DIAGNOSIS — Z7901 Long term (current) use of anticoagulants: Secondary | ICD-10-CM | POA: Diagnosis not present

## 2016-03-26 DIAGNOSIS — K641 Second degree hemorrhoids: Secondary | ICD-10-CM | POA: Diagnosis not present

## 2016-03-30 ENCOUNTER — Ambulatory Visit (INDEPENDENT_AMBULATORY_CARE_PROVIDER_SITE_OTHER): Payer: Medicare HMO | Admitting: *Deleted

## 2016-03-30 DIAGNOSIS — I639 Cerebral infarction, unspecified: Secondary | ICD-10-CM | POA: Diagnosis not present

## 2016-03-30 NOTE — Telephone Encounter (Signed)
Informed wife (pt not home) that Dr. Curt Bears is agreeable to switching to Propranolol from Toprol 25 mg.  I will forward to [facility] for advisement of Propranolol dosage to switch to.  Informed that office would call pt tomorrow to inform him of medication/dosing to start.  Wife thanks [place] for updating them and will inform pt when he returns home.

## 2016-03-30 NOTE — Progress Notes (Signed)
Carelink Summary Report / Loop Recorder 

## 2016-03-31 MED ORDER — PROPRANOLOL HCL 80 MG PO TABS: 80.0000 mg | ORAL_TABLET | Freq: Two times a day (BID) | ORAL | 6 refills | Status: DC

## 2016-03-31 NOTE — Telephone Encounter (Signed)
Can switch metoprolol to 80 mg propranolol twice daily.

## 2016-03-31 NOTE — Telephone Encounter (Signed)
Pt aware he can switch from Toprol XL to propranolol 80mg  bid.

## 2016-04-02 ENCOUNTER — Ambulatory Visit
Admission: RE | Admit: 2016-04-02 | Discharge: 2016-04-02 | Disposition: A | Discharge status: Home / Self Care | Payer: Medicare HMO | Source: Ambulatory Visit | Attending: Neurosurgery | Admitting: Neurosurgery

## 2016-04-02 DIAGNOSIS — M47817 Spondylosis without myelopathy or radiculopathy, lumbosacral region: Secondary | ICD-10-CM | POA: Diagnosis not present

## 2016-04-02 DIAGNOSIS — M4716 Other spondylosis with myelopathy, lumbar region: Secondary | ICD-10-CM

## 2016-04-02 MED ORDER — METHYLPREDNISOLONE ACETATE 40 MG/ML INJ SUSP (RADIOLOG: 120.0000 mg | Freq: Once | INTRAMUSCULAR | Status: DC

## 2016-04-02 MED ORDER — IOPAMIDOL (ISOVUE-M 200) INJECTION 41%
1.0000 mL | Freq: Once | INTRAMUSCULAR | Status: AC
  Administered 2016-04-02: 1 mL via INTRA_ARTICULAR

## 2016-04-02 NOTE — Discharge Instructions (Signed)

## 2016-04-09 ENCOUNTER — Telehealth: Payer: Self-pay | Admitting: Neurology

## 2016-04-09 DIAGNOSIS — M4716 Other spondylosis with myelopathy, lumbar region: Secondary | ICD-10-CM | POA: Diagnosis not present

## 2016-04-09 NOTE — Telephone Encounter (Signed)
Hillary from [facility] called for Dr Glenna Fellows because he is scheduled to do surgery for Pt  4-16. He is needing clearance from a Neurologic standpoint. Hillary said she will fax in office notes from visit today to explain the surgery.

## 2016-04-10 ENCOUNTER — Encounter: Payer: Self-pay | Admitting: Neurology

## 2016-04-10 NOTE — Telephone Encounter (Signed)
Joshua [name], can you print the letter I just wrote? I can't print it. And maybe next week on Monday you can get this faxed thanks.

## 2016-04-13 ENCOUNTER — Ambulatory Visit (INDEPENDENT_AMBULATORY_CARE_PROVIDER_SITE_OTHER): Payer: Medicare HMO | Admitting: Cardiology

## 2016-04-13 ENCOUNTER — Encounter: Payer: Self-pay | Admitting: Cardiology

## 2016-04-13 ENCOUNTER — Other Ambulatory Visit: Payer: Self-pay | Admitting: Neurology

## 2016-04-13 VITALS — BP 96/70 | HR 82 | Ht 73.0 in | Wt 232.4 lb

## 2016-04-13 DIAGNOSIS — I493 Ventricular premature depolarization: Secondary | ICD-10-CM | POA: Diagnosis not present

## 2016-04-13 LAB — CUP PACEART REMOTE DEVICE CHECK
Date Time Interrogation Session: 20180318173844
Implantable Pulse Generator Implant Date: 20170323

## 2016-04-13 LAB — CUP PACEART INCLINIC DEVICE CHECK
Date Time Interrogation Session: 20180402111545
Implantable Pulse Generator Implant Date: 20170323

## 2016-04-13 MED ORDER — PROPRANOLOL HCL 40 MG PO TABS
40.0000 mg | ORAL_TABLET | Freq: Two times a day (BID) | ORAL | 6 refills | Status: DC
Start: 2016-04-13 — End: 2016-07-21

## 2016-04-13 NOTE — Telephone Encounter (Signed)
Letter faxed to Dr Maia Plan per Dr Jaynee Eagles.

## 2016-04-13 NOTE — Addendum Note (Signed)
Addended by: Stanton Kidney on: 04/13/2016 11:04 AM   Modules accepted: Orders

## 2016-04-13 NOTE — Progress Notes (Signed)
Electrophysiology Office Note   Date:  04/13/2016   ID:  Joshua [name], DOB 12-31-1945, MRN [id number]  PCP:  Marton Redwood, MD  Primary Electrophysiologist:  Effrey Davidow Meredith Leeds, MD    Chief Complaint  Patient presents with  . Follow-up     History of Present Illness: Joshua [name] is a 71 y.o. male who presents today for electrophysiology evaluation.   He has a history of CVA, OSA, HLD, GERD, HTN, RBBB.  His stroke occurred 02/2015.  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  A linq monitor was implanted.  Since then it was noted that he had an excessive amount of PVCs on his monitor. He complains of shortness of breath, dyspnea on exertion, and orthopnea. His monitor showed that he was having 26% PVCs. Unsuccessful PVC ablation performed with intermittent suppression of PVCs. He was started on flecainide 75 mg twice a day, and is felt much improved since leaving the hospital.   Today, he denies symptoms of chest pain, lower extremity edema, claudication, dizziness, presyncope, syncope, bleeding, or neurologic sequela. The patient is tolerating medications without difficulties and is otherwise without complaint today. He does continue to have occasional palpitations.He is planning on having lumbar spinal fusion this month. He says that aside from his back pain in his tremor, he has done well.   Past Medical History:  Diagnosis Date  . Allergic rhinitis   . Allergy   . Anal fissure   . Anxiety    from chronic pain from surgery- on Cymbalta  . Arthritis   . Asthma   . Barrett's esophagus 03/29/2014  . Carotid artery disease ([place])    Carotid Doppler normal August, 2007  . Cataract   . Chest pain    Coronaries normal 1996 /  nuclear..06/2002..normal...EF  56% /  stress echo.. May, 2011.... no  scar or ischemia... rate related RBBB  . Diverticulosis   . Dyslipidemia   . Ejection fraction    EF 60%, stress  echo, May, 2011  . GERD (gastroesophageal reflux disease)   . Headache   . History of loop recorder    has since 04/04/15  . HTN (hypertension)    takes Metoprolol for PVC control  . Hx of colonic polyps    adenomatous  . Hx of colonoscopy   . Hyperlipidemia   . IFG (impaired fasting glucose)   . Palpitations    Benign PVCs  . RBBB (right bundle branch block)    rate related  . Shingles   . Sleep apnea    pt denies  . Stroke [facility])    TIA  . TIA (transient ischemic attack) 02/2015   Per pt, had 2 strokes  . Tremor    Hand tremor  . Vertigo    Past Surgical History:  Procedure Laterality Date  . BACK SURGERY  2002,2009   x 6  . CHOLECYSTECTOMY  11/30/2012   with IOC  . COLONOSCOPY    . ELECTROPHYSIOLOGIC STUDY N/A 09/26/2015   Procedure: V Tach Ablation (PVC);  Surgeon: Tyrrell Stephens Meredith Leeds, MD;  Location: [facility];  Service: Cardiovascular;  Laterality: N/A;  . EP IMPLANTABLE DEVICE N/A 04/04/2015   Procedure: Loop Recorder Insertion;  Surgeon: Vaneta Hammontree Meredith Leeds, MD;  Location: [facility];  Service: Cardiovascular;  Laterality: N/A;  . HERNIA REPAIR     laprascopic  . KNEE SURGERY  DECEMBER 2017,LEFT KNEE SCOPED  . NECK SURGERY  2002  . POLYPECTOMY    . ROTATOR CUFF REPAIR Left   . TEE WITHOUT CARDIOVERSION N/A 04/04/2015   Procedure: TRANSESOPHAGEAL ECHOCARDIOGRAM (TEE);  Surgeon: Lelon Perla, MD;  Location: [facility] ENDOSCOPY;  Service: Cardiovascular;  Laterality: N/A;  . UPPER GASTROINTESTINAL ENDOSCOPY    . V Tach ablation  09/26/2015     Current Outpatient Prescriptions  Medication Sig Dispense Refill  . albuterol (PROVENTIL HFA;VENTOLIN HFA) 108 (90 BASE) MCG/ACT inhaler Inhale 2 puffs into the lungs every 4 (four) hours as needed for wheezing.     . Cholecalciferol (VITAMIN D) 2000 units CAPS Take 2,000 Units by mouth daily.    . clopidogrel (PLAVIX) 75 MG tablet Take 1 tablet (75 mg total) by mouth daily. 30 tablet 2  . dicyclomine  (BENTYL) 10 MG capsule Take 10 mg by mouth 3 (three) times daily as needed for spasms.    [name] Calcium (STOOL SOFTENER PO) Take 1 capsule by mouth 2 (two) times daily.     . DULoxetine (CYMBALTA) 60 MG capsule Take by mouth daily.     [name] DYMISTA 137-50 MCG/ACT SUSP Place 1 puff into both nostrils at bedtime.     . finasteride (PROSCAR) 5 MG tablet Take 5 mg by mouth Daily.    . fish oil-omega-3 fatty acids 1000 MG capsule Take 1 g by mouth.     . flecainide (TAMBOCOR) 150 MG tablet TAKE 1/2 TABLET BY MOUTH EVERY 12 HOURS 60 tablet 1  . hydrocortisone (ANUSOL-HC) 2.5 % rectal cream Place 1 application rectally 2 (two) times daily. 30 g 1  . lansoprazole (PREVACID) 30 MG capsule TAKE ONE CAPSULE BY MOUTH TWICE A DAY 180 capsule 1  . levocetirizine (XYZAL) 5 MG tablet Take 5 mg by mouth every evening.      . metoprolol succinate (TOPROL-XL) 25 MG 24 hr tablet Take 25 mg by mouth daily.  12  . montelukast (SINGULAIR) 10 MG tablet Take 10 mg by mouth at bedtime.      . primidone (MYSOLINE) 50 MG tablet Take 3 tablets (150 mg total) by mouth daily. (Patient taking differently: Take 100 mg by mouth daily. ) 90 tablet 6  . rosuvastatin (CRESTOR) 10 MG tablet Take 5 mg by mouth daily. Take 1/2 tablet daily    . tamsulosin (FLOMAX) 0.4 MG CAPS capsule Take 0.4 mg by mouth daily after supper.     . zolpidem (AMBIEN) 10 MG tablet Take 10 mg by mouth at bedtime.     Current Facility-Administered Medications  Medication Dose Route Frequency Provider Last Rate Last Dose  . 0.9 %  sodium chloride infusion  500 mL Intravenous Continuous Irene Shipper, MD        Allergies:   Floxin [ofloxacin]   Social History:  The patient  reports that he has never smoked. He has never used smokeless tobacco. He reports that he does not drink alcohol or use drugs.   Family History:  The patient's family history includes Breast cancer in his paternal grandmother; Clotting disorder in his father; Heart attack in his  father and paternal grandfather; Heart disease in his brother and father; Hypertension in his mother.    ROS:  Please see the history of present illness.   Otherwise, review of systems is positive for back pain, tremor.   All other systems are reviewed and negative.    PHYSICAL EXAM: VS:  BP 96/70 (BP Location: Left Arm, Patient  Position: Sitting, Cuff Size: Normal)   Pulse 82   Ht 6\' 1"  (1.854 m)   Wt 232 lb 6.4 oz (105.4 kg)   SpO2 93%   BMI 30.66 kg/m  , BMI Body mass index is 30.66 kg/m. GEN: Well nourished, well developed, in no acute distress  HEENT: normal  Neck: no JVD, carotid bruits, or masses Cardiac: RRR; no murmurs, rubs, or gallops,no edema  Respiratory:  clear to auscultation bilaterally, normal work of breathing GI: soft, nontender, nondistended, + BS MS: no deformity or atrophy  Skin: warm and dry,  device pocket is well healed Neuro:  Strength and sensation are intact Psych: euthymic mood, full affect  EKG:  EKG is ordered today. Personal review of the ECG today shows sinus rhythm, rate 82, PVC  Personal review of the device interrogation is reviewed today in detail.  See PaceArt for details.   Recent Labs: 08/25/2015: ALT 22 09/18/2015: BUN 19; Creat 0.88; Hemoglobin 14.8; Platelets 200; Potassium 4.4; Sodium 138    Lipid Panel     Component Value Date/Time   CHOL 137 03/06/2015 0550   TRIG 105 03/06/2015 0550   HDL 41 03/06/2015 0550   CHOLHDL 3.3 03/06/2015 0550   VLDL 21 03/06/2015 0550   LDLCALC 75 03/06/2015 0550     Wt Readings from Last 3 Encounters:  04/13/16 232 lb 6.4 oz (105.4 kg)  03/11/16 235 lb 6.4 oz (106.8 kg)  12/26/15 226 lb (102.5 kg)      Other studies Reviewed: Additional studies/ records that were reviewed today include: TEE 04/04/15  Review of the above records today demonstrates:  - Left ventricle: Systolic function was normal. The estimated  ejection fraction was in the range of 50% to 55%. Wall motion was  normal;  there were no regional wall motion abnormalities. - Aortic valve: No evidence of vegetation. There was trivial  regurgitation. - Mitral valve: No evidence of vegetation. There was mild  regurgitation. - Left atrium: The atrium was mildly dilated. No evidence of  thrombus in the atrial cavity or appendage. - Right atrium: No evidence of thrombus in the atrial cavity or  appendage. - Atrial septum: There was a patent foramen ovale. - Tricuspid valve: No evidence of vegetation. - Pulmonic valve: No evidence of vegetation.  Holter 06/26/15 Average HR: 88 bpm Minimum HR: 56 bpm Maximum HR: 128 bpm  26% PVCs  ASSESSMENT AND PLAN:  1.  PVCs: Ablaiton performed with intermittent suppression. Had PVCs noted post ablation and was started on flecainide. Since then he has done well without any major issues. We'll therefore continue him on his flecainide. Due to his resting tremor, we'll switch him from metoprolol to propranolol 40 mg twice a day.   2. Hypertension: Currently well controlled  3. Preoperative evaluation: Would be at intermediate risk for an intermediate risk procedure. Continue propranolol and flecainide.  Current medicines are reviewed at length with the patient today.   The patient does not have concerns regarding his medicines.  The following changes were made today:  Switch metoprolol to propranolol  Labs/ tests ordered today include:  Orders Placed This Encounter  Procedures  . EKG 12-Lead     Disposition:   FU with Khalfani Weideman 6 months   Signed, Quita Mcgrory Meredith Leeds, MD  04/13/2016 10:50 AM     [facility] [address] [place] [address] [phone] (office) [phone] (fax)

## 2016-04-13 NOTE — Patient Instructions (Signed)
Medication Instructions:   Your physician has recommended you make the following change in your medication:  1) DECREASE Propranolol to 40 mg twice daily  --- If you need a refill on your cardiac medications before your next appointment, please call your pharmacy. ---  Labwork:  None ordered  Testing/Procedures:  None ordered  Follow-Up:  Your physician wants you to follow-up in: 6 months with Dr. Curt Bears.  You will receive a reminder letter in the mail two months in advance. If you don't receive a letter, please call our office to schedule the follow-up appointment.   Thank you for choosing [facility]!!   Trinidad Curet, RN [phone]

## 2016-04-14 ENCOUNTER — Encounter: Payer: Self-pay | Admitting: Neurology

## 2016-04-20 DIAGNOSIS — Z8673 Personal history of transient ischemic attack (TIA), and cerebral infarction without residual deficits: Secondary | ICD-10-CM | POA: Diagnosis not present

## 2016-04-20 DIAGNOSIS — K22711 Barrett's esophagus with high grade dysplasia: Secondary | ICD-10-CM | POA: Diagnosis not present

## 2016-04-20 DIAGNOSIS — K449 Diaphragmatic hernia without obstruction or gangrene: Secondary | ICD-10-CM | POA: Diagnosis not present

## 2016-04-20 DIAGNOSIS — I10 Essential (primary) hypertension: Secondary | ICD-10-CM | POA: Diagnosis not present

## 2016-04-20 DIAGNOSIS — J45909 Unspecified asthma, uncomplicated: Secondary | ICD-10-CM | POA: Diagnosis not present

## 2016-04-20 DIAGNOSIS — Z86010 Personal history of colonic polyps: Secondary | ICD-10-CM | POA: Diagnosis not present

## 2016-04-20 DIAGNOSIS — Z8719 Personal history of other diseases of the digestive system: Secondary | ICD-10-CM | POA: Diagnosis not present

## 2016-04-20 DIAGNOSIS — K2270 Barrett's esophagus without dysplasia: Secondary | ICD-10-CM | POA: Diagnosis not present

## 2016-04-20 DIAGNOSIS — K2930 Chronic superficial gastritis without bleeding: Secondary | ICD-10-CM | POA: Diagnosis not present

## 2016-04-20 DIAGNOSIS — Z09 Encounter for follow-up examination after completed treatment for conditions other than malignant neoplasm: Secondary | ICD-10-CM | POA: Diagnosis not present

## 2016-04-20 DIAGNOSIS — K317 Polyp of stomach and duodenum: Secondary | ICD-10-CM | POA: Diagnosis not present

## 2016-04-20 DIAGNOSIS — Z79899 Other long term (current) drug therapy: Secondary | ICD-10-CM | POA: Diagnosis not present

## 2016-04-20 DIAGNOSIS — K219 Gastro-esophageal reflux disease without esophagitis: Secondary | ICD-10-CM | POA: Diagnosis not present

## 2016-04-20 DIAGNOSIS — I493 Ventricular premature depolarization: Secondary | ICD-10-CM | POA: Diagnosis not present

## 2016-04-22 DIAGNOSIS — Z85828 Personal history of other malignant neoplasm of skin: Secondary | ICD-10-CM | POA: Diagnosis not present

## 2016-04-22 DIAGNOSIS — L570 Actinic keratosis: Secondary | ICD-10-CM | POA: Diagnosis not present

## 2016-04-22 DIAGNOSIS — D225 Melanocytic nevi of trunk: Secondary | ICD-10-CM | POA: Diagnosis not present

## 2016-04-22 DIAGNOSIS — L821 Other seborrheic keratosis: Secondary | ICD-10-CM | POA: Diagnosis not present

## 2016-04-24 DIAGNOSIS — Z981 Arthrodesis status: Secondary | ICD-10-CM | POA: Diagnosis not present

## 2016-04-24 DIAGNOSIS — Z01818 Encounter for other preprocedural examination: Secondary | ICD-10-CM | POA: Diagnosis not present

## 2016-04-27 DIAGNOSIS — M5126 Other intervertebral disc displacement, lumbar region: Secondary | ICD-10-CM | POA: Diagnosis not present

## 2016-04-27 DIAGNOSIS — M47897 Other spondylosis, lumbosacral region: Secondary | ICD-10-CM | POA: Diagnosis not present

## 2016-04-27 DIAGNOSIS — Z981 Arthrodesis status: Secondary | ICD-10-CM | POA: Diagnosis not present

## 2016-04-27 DIAGNOSIS — M4716 Other spondylosis with myelopathy, lumbar region: Secondary | ICD-10-CM | POA: Diagnosis not present

## 2016-04-27 DIAGNOSIS — G250 Essential tremor: Secondary | ICD-10-CM | POA: Diagnosis not present

## 2016-04-27 DIAGNOSIS — I10 Essential (primary) hypertension: Secondary | ICD-10-CM | POA: Diagnosis not present

## 2016-04-27 DIAGNOSIS — M4726 Other spondylosis with radiculopathy, lumbar region: Secondary | ICD-10-CM | POA: Diagnosis not present

## 2016-04-27 DIAGNOSIS — M47816 Spondylosis without myelopathy or radiculopathy, lumbar region: Secondary | ICD-10-CM | POA: Diagnosis not present

## 2016-04-27 DIAGNOSIS — J45909 Unspecified asthma, uncomplicated: Secondary | ICD-10-CM | POA: Diagnosis not present

## 2016-04-27 DIAGNOSIS — I639 Cerebral infarction, unspecified: Secondary | ICD-10-CM | POA: Diagnosis not present

## 2016-04-27 DIAGNOSIS — K219 Gastro-esophageal reflux disease without esophagitis: Secondary | ICD-10-CM | POA: Diagnosis not present

## 2016-04-27 DIAGNOSIS — E785 Hyperlipidemia, unspecified: Secondary | ICD-10-CM | POA: Diagnosis not present

## 2016-04-27 DIAGNOSIS — I959 Hypotension, unspecified: Secondary | ICD-10-CM | POA: Diagnosis not present

## 2016-04-27 DIAGNOSIS — I4891 Unspecified atrial fibrillation: Secondary | ICD-10-CM | POA: Diagnosis not present

## 2016-04-27 DIAGNOSIS — Z8673 Personal history of transient ischemic attack (TIA), and cerebral infarction without residual deficits: Secondary | ICD-10-CM | POA: Diagnosis not present

## 2016-04-28 ENCOUNTER — Ambulatory Visit (INDEPENDENT_AMBULATORY_CARE_PROVIDER_SITE_OTHER): Payer: Medicare HMO | Admitting: *Deleted

## 2016-04-28 DIAGNOSIS — I639 Cerebral infarction, unspecified: Secondary | ICD-10-CM | POA: Diagnosis not present

## 2016-04-28 NOTE — Progress Notes (Signed)
Carelink Summary Report / Loop Recorder 

## 2016-05-02 DIAGNOSIS — I4891 Unspecified atrial fibrillation: Secondary | ICD-10-CM | POA: Diagnosis not present

## 2016-05-02 DIAGNOSIS — I2510 Atherosclerotic heart disease of native coronary artery without angina pectoris: Secondary | ICD-10-CM | POA: Diagnosis not present

## 2016-05-02 DIAGNOSIS — Z8673 Personal history of transient ischemic attack (TIA), and cerebral infarction without residual deficits: Secondary | ICD-10-CM | POA: Diagnosis not present

## 2016-05-02 DIAGNOSIS — M4326 Fusion of spine, lumbar region: Secondary | ICD-10-CM | POA: Diagnosis not present

## 2016-05-02 DIAGNOSIS — Z4789 Encounter for other orthopedic aftercare: Secondary | ICD-10-CM | POA: Diagnosis not present

## 2016-05-02 DIAGNOSIS — I10 Essential (primary) hypertension: Secondary | ICD-10-CM | POA: Diagnosis not present

## 2016-05-04 DIAGNOSIS — M48061 Spinal stenosis, lumbar region without neurogenic claudication: Secondary | ICD-10-CM | POA: Diagnosis not present

## 2016-05-04 DIAGNOSIS — M4716 Other spondylosis with myelopathy, lumbar region: Secondary | ICD-10-CM | POA: Diagnosis not present

## 2016-05-04 DIAGNOSIS — Z981 Arthrodesis status: Secondary | ICD-10-CM | POA: Diagnosis not present

## 2016-05-05 DIAGNOSIS — M4326 Fusion of spine, lumbar region: Secondary | ICD-10-CM | POA: Diagnosis not present

## 2016-05-05 DIAGNOSIS — I10 Essential (primary) hypertension: Secondary | ICD-10-CM | POA: Diagnosis not present

## 2016-05-05 DIAGNOSIS — I2510 Atherosclerotic heart disease of native coronary artery without angina pectoris: Secondary | ICD-10-CM | POA: Diagnosis not present

## 2016-05-05 DIAGNOSIS — Z8673 Personal history of transient ischemic attack (TIA), and cerebral infarction without residual deficits: Secondary | ICD-10-CM | POA: Diagnosis not present

## 2016-05-05 DIAGNOSIS — Z4789 Encounter for other orthopedic aftercare: Secondary | ICD-10-CM | POA: Diagnosis not present

## 2016-05-05 DIAGNOSIS — I4891 Unspecified atrial fibrillation: Secondary | ICD-10-CM | POA: Diagnosis not present

## 2016-05-07 DIAGNOSIS — I10 Essential (primary) hypertension: Secondary | ICD-10-CM | POA: Diagnosis not present

## 2016-05-07 DIAGNOSIS — Z8673 Personal history of transient ischemic attack (TIA), and cerebral infarction without residual deficits: Secondary | ICD-10-CM | POA: Diagnosis not present

## 2016-05-07 DIAGNOSIS — I4891 Unspecified atrial fibrillation: Secondary | ICD-10-CM | POA: Diagnosis not present

## 2016-05-07 DIAGNOSIS — M4326 Fusion of spine, lumbar region: Secondary | ICD-10-CM | POA: Diagnosis not present

## 2016-05-07 DIAGNOSIS — Z4789 Encounter for other orthopedic aftercare: Secondary | ICD-10-CM | POA: Diagnosis not present

## 2016-05-07 DIAGNOSIS — I2510 Atherosclerotic heart disease of native coronary artery without angina pectoris: Secondary | ICD-10-CM | POA: Diagnosis not present

## 2016-05-12 DIAGNOSIS — Z4789 Encounter for other orthopedic aftercare: Secondary | ICD-10-CM | POA: Diagnosis not present

## 2016-05-12 DIAGNOSIS — M4326 Fusion of spine, lumbar region: Secondary | ICD-10-CM | POA: Diagnosis not present

## 2016-05-12 DIAGNOSIS — I10 Essential (primary) hypertension: Secondary | ICD-10-CM | POA: Diagnosis not present

## 2016-05-12 DIAGNOSIS — Z8673 Personal history of transient ischemic attack (TIA), and cerebral infarction without residual deficits: Secondary | ICD-10-CM | POA: Diagnosis not present

## 2016-05-12 DIAGNOSIS — I2510 Atherosclerotic heart disease of native coronary artery without angina pectoris: Secondary | ICD-10-CM | POA: Diagnosis not present

## 2016-05-12 DIAGNOSIS — I4891 Unspecified atrial fibrillation: Secondary | ICD-10-CM | POA: Diagnosis not present

## 2016-05-13 ENCOUNTER — Other Ambulatory Visit: Payer: Self-pay | Admitting: Nurse Practitioner

## 2016-05-13 LAB — CUP PACEART REMOTE DEVICE CHECK
Date Time Interrogation Session: 20180417180859
Implantable Pulse Generator Implant Date: 20170323

## 2016-05-14 DIAGNOSIS — I2510 Atherosclerotic heart disease of native coronary artery without angina pectoris: Secondary | ICD-10-CM | POA: Diagnosis not present

## 2016-05-14 DIAGNOSIS — Z8673 Personal history of transient ischemic attack (TIA), and cerebral infarction without residual deficits: Secondary | ICD-10-CM | POA: Diagnosis not present

## 2016-05-14 DIAGNOSIS — M4326 Fusion of spine, lumbar region: Secondary | ICD-10-CM | POA: Diagnosis not present

## 2016-05-14 DIAGNOSIS — Z4789 Encounter for other orthopedic aftercare: Secondary | ICD-10-CM | POA: Diagnosis not present

## 2016-05-14 DIAGNOSIS — I10 Essential (primary) hypertension: Secondary | ICD-10-CM | POA: Diagnosis not present

## 2016-05-14 DIAGNOSIS — I4891 Unspecified atrial fibrillation: Secondary | ICD-10-CM | POA: Diagnosis not present

## 2016-05-19 DIAGNOSIS — Z4789 Encounter for other orthopedic aftercare: Secondary | ICD-10-CM | POA: Diagnosis not present

## 2016-05-19 DIAGNOSIS — I4891 Unspecified atrial fibrillation: Secondary | ICD-10-CM | POA: Diagnosis not present

## 2016-05-19 DIAGNOSIS — I10 Essential (primary) hypertension: Secondary | ICD-10-CM | POA: Diagnosis not present

## 2016-05-19 DIAGNOSIS — M4326 Fusion of spine, lumbar region: Secondary | ICD-10-CM | POA: Diagnosis not present

## 2016-05-19 DIAGNOSIS — I2510 Atherosclerotic heart disease of native coronary artery without angina pectoris: Secondary | ICD-10-CM | POA: Diagnosis not present

## 2016-05-19 DIAGNOSIS — Z8673 Personal history of transient ischemic attack (TIA), and cerebral infarction without residual deficits: Secondary | ICD-10-CM | POA: Diagnosis not present

## 2016-05-21 DIAGNOSIS — I10 Essential (primary) hypertension: Secondary | ICD-10-CM | POA: Diagnosis not present

## 2016-05-21 DIAGNOSIS — M4326 Fusion of spine, lumbar region: Secondary | ICD-10-CM | POA: Diagnosis not present

## 2016-05-21 DIAGNOSIS — Z4789 Encounter for other orthopedic aftercare: Secondary | ICD-10-CM | POA: Diagnosis not present

## 2016-05-21 DIAGNOSIS — I2510 Atherosclerotic heart disease of native coronary artery without angina pectoris: Secondary | ICD-10-CM | POA: Diagnosis not present

## 2016-05-21 DIAGNOSIS — Z8673 Personal history of transient ischemic attack (TIA), and cerebral infarction without residual deficits: Secondary | ICD-10-CM | POA: Diagnosis not present

## 2016-05-21 DIAGNOSIS — I4891 Unspecified atrial fibrillation: Secondary | ICD-10-CM | POA: Diagnosis not present

## 2016-05-26 DIAGNOSIS — Z8673 Personal history of transient ischemic attack (TIA), and cerebral infarction without residual deficits: Secondary | ICD-10-CM | POA: Diagnosis not present

## 2016-05-26 DIAGNOSIS — I10 Essential (primary) hypertension: Secondary | ICD-10-CM | POA: Diagnosis not present

## 2016-05-26 DIAGNOSIS — M4326 Fusion of spine, lumbar region: Secondary | ICD-10-CM | POA: Diagnosis not present

## 2016-05-26 DIAGNOSIS — Z4789 Encounter for other orthopedic aftercare: Secondary | ICD-10-CM | POA: Diagnosis not present

## 2016-05-26 DIAGNOSIS — I4891 Unspecified atrial fibrillation: Secondary | ICD-10-CM | POA: Diagnosis not present

## 2016-05-26 DIAGNOSIS — I2510 Atherosclerotic heart disease of native coronary artery without angina pectoris: Secondary | ICD-10-CM | POA: Diagnosis not present

## 2016-05-28 ENCOUNTER — Ambulatory Visit (INDEPENDENT_AMBULATORY_CARE_PROVIDER_SITE_OTHER): Payer: Medicare HMO | Admitting: *Deleted

## 2016-05-28 DIAGNOSIS — I2510 Atherosclerotic heart disease of native coronary artery without angina pectoris: Secondary | ICD-10-CM | POA: Diagnosis not present

## 2016-05-28 DIAGNOSIS — Z8673 Personal history of transient ischemic attack (TIA), and cerebral infarction without residual deficits: Secondary | ICD-10-CM | POA: Diagnosis not present

## 2016-05-28 DIAGNOSIS — I4891 Unspecified atrial fibrillation: Secondary | ICD-10-CM | POA: Diagnosis not present

## 2016-05-28 DIAGNOSIS — I639 Cerebral infarction, unspecified: Secondary | ICD-10-CM | POA: Diagnosis not present

## 2016-05-28 DIAGNOSIS — I10 Essential (primary) hypertension: Secondary | ICD-10-CM | POA: Diagnosis not present

## 2016-05-28 DIAGNOSIS — M4326 Fusion of spine, lumbar region: Secondary | ICD-10-CM | POA: Diagnosis not present

## 2016-05-28 DIAGNOSIS — Z4789 Encounter for other orthopedic aftercare: Secondary | ICD-10-CM | POA: Diagnosis not present

## 2016-05-28 NOTE — Progress Notes (Signed)
Carelink Summary Report / Loop Recorder 

## 2016-06-01 ENCOUNTER — Other Ambulatory Visit: Payer: Self-pay | Admitting: Internal Medicine

## 2016-06-08 LAB — CUP PACEART REMOTE DEVICE CHECK
Date Time Interrogation Session: 20180517183934
Implantable Pulse Generator Implant Date: 20170323

## 2016-06-10 DIAGNOSIS — R7302 Impaired glucose tolerance (oral): Secondary | ICD-10-CM | POA: Diagnosis not present

## 2016-06-10 DIAGNOSIS — E784 Other hyperlipidemia: Secondary | ICD-10-CM | POA: Diagnosis not present

## 2016-06-10 DIAGNOSIS — Z125 Encounter for screening for malignant neoplasm of prostate: Secondary | ICD-10-CM | POA: Diagnosis not present

## 2016-06-10 DIAGNOSIS — I10 Essential (primary) hypertension: Secondary | ICD-10-CM | POA: Diagnosis not present

## 2016-06-17 DIAGNOSIS — I10 Essential (primary) hypertension: Secondary | ICD-10-CM | POA: Diagnosis not present

## 2016-06-17 DIAGNOSIS — Z6830 Body mass index (BMI) 30.0-30.9, adult: Secondary | ICD-10-CM | POA: Diagnosis not present

## 2016-06-17 DIAGNOSIS — E784 Other hyperlipidemia: Secondary | ICD-10-CM | POA: Diagnosis not present

## 2016-06-17 DIAGNOSIS — I69954 Hemiplegia and hemiparesis following unspecified cerebrovascular disease affecting left non-dominant side: Secondary | ICD-10-CM | POA: Diagnosis not present

## 2016-06-17 DIAGNOSIS — K2270 Barrett's esophagus without dysplasia: Secondary | ICD-10-CM | POA: Diagnosis not present

## 2016-06-17 DIAGNOSIS — R251 Tremor, unspecified: Secondary | ICD-10-CM | POA: Diagnosis not present

## 2016-06-17 DIAGNOSIS — R7302 Impaired glucose tolerance (oral): Secondary | ICD-10-CM | POA: Diagnosis not present

## 2016-06-17 DIAGNOSIS — Z0000 Encounter for general adult medical examination without abnormal findings: Secondary | ICD-10-CM | POA: Diagnosis not present

## 2016-06-17 DIAGNOSIS — R55 Syncope and collapse: Secondary | ICD-10-CM | POA: Diagnosis not present

## 2016-06-17 DIAGNOSIS — J45909 Unspecified asthma, uncomplicated: Secondary | ICD-10-CM | POA: Diagnosis not present

## 2016-06-18 ENCOUNTER — Encounter: Payer: Self-pay | Admitting: Neurology

## 2016-06-18 ENCOUNTER — Telehealth: Payer: Self-pay | Admitting: Cardiology

## 2016-06-18 NOTE — Telephone Encounter (Signed)
Pt reports passing out Saturday morning.  States he was putting his back brace on (back surgery 8 wks ago) and next thing he knows he was face down on the floor. ILR monitor showing no brady/afib episodes around that time. Pt reports being at PCP yesterday and they did orthostatics -- they were positive for both BP & HR dropping upon changing positions. I have left a message at PCP office to send OV w/ ortho readings. He also reports doing better since decreasing Propranolol, but not a huge improvement. Pt understands I will review w/ Camnitz on Monday and call him w/ plan/recommendations. Advised to call office sooner if he passes out again. Pt is agreeable to plan.

## 2016-06-18 NOTE — Telephone Encounter (Signed)
New Message     Since the medicine change he has been getting lightheaded and last Saturday he blacked out

## 2016-06-24 NOTE — Telephone Encounter (Signed)
Advised LINQ not showing concerning/abnormal rhythm.  Dr. Curt Bears reviewed Dr. Dario Guardian notes.  No orders at this time.  Advised patient to continue to monitor and call if symptoms begin/worsen and/or he has another syncopal episode. Patient verbalized understanding and agreeable to plan.

## 2016-06-29 ENCOUNTER — Ambulatory Visit (INDEPENDENT_AMBULATORY_CARE_PROVIDER_SITE_OTHER): Payer: Medicare HMO | Admitting: *Deleted

## 2016-06-29 DIAGNOSIS — I639 Cerebral infarction, unspecified: Secondary | ICD-10-CM

## 2016-06-29 NOTE — Progress Notes (Signed)
Carelink Summary Report / Loop Recorder 

## 2016-07-06 ENCOUNTER — Other Ambulatory Visit: Payer: Self-pay | Admitting: Neurology

## 2016-07-07 LAB — CUP PACEART REMOTE DEVICE CHECK
Date Time Interrogation Session: 20180616193825
Implantable Pulse Generator Implant Date: 20170323

## 2016-07-17 NOTE — Progress Notes (Addendum)
Subjective:   Joshua [name] was seen in consultation in the movement disorder clinic at the request of Marton Redwood, MD.  The evaluation is for tremor.  Pt currently seeing Dr. Jaynee Eagles and presents for second opinion to see if perhaps has PD.  Has seen Dr. Jacelyn Grip in the past. Patient is a 71 year old male with a history of vertigo, embolic stroke in February, 2017 and ophthalmic migraine who presents today for evaluation of his essential tremor.  Tremor started when he was in his 40s, but has been getting slowly but progressively worse with the course of time.  In the past at the onset, tremor was in the R hand only and it would shake with fine motor coordination and with stress.  States that rest tremor on the R seemed to start about 3 months after the embolic infarct.    Tremor is most noticeable when at rest now.  Infarct involved tiny acute infarcts in the L motor strip and L post central gyrus (was watershed territory).    Now has loop recorder.  TEE with PFO.   Carotid u/s normal.  LDL 75 at time of event.  There is a family hx of tremor in his identical twin brother.    Tremor:   Affected by caffeine:  No. (2 cups coffee per day) Affected by alcohol:  Doesn't drink alcohol Affected by stress:  No. Affected by fatigue:  Yes.   Spills soup if on spoon:  Yes.   Spills glass of liquid if full:  No. But carries with 2 hands   Pt tells me about 4 different passing out episodes.  The first was many years ago after a back surgery and he dropped something and went to pick it up and passed out and it was attributed to going off of percocet.  The second epiosde occurred the Saturday following his stroke in 02/2015.  He was standing up in utility room and without warning he passed out and he woke up right when he hit the floor.  The third episode was about 3 weeks ago and he walked into the bedroom and got his back brace and passed out.  He woke up right when he hit the floor.  Dr. Raul Del records indicate  that he felt that his syncope was likely related to orthostasis in the setting of Flomax, his beta blocker and his pain medications.  He did recommend that the patient follow-up with cardiology and consider alternatives to the beta blocker and Flomax.   Current/Previously tried tremor medications: primidone - unable to tolerate above 50 mg bid; propranolol 20 mg bid; metoprolol 25 mg daily  Voice: weaker; raspy Sleep: sleeping well with [place]  Vivid Dreams:  No.  Acting out dreams:  Yes.   Wet Pillows: No. Postural symptoms:  Yes.    Falls?  Yes.  , 2 falls (one missed a step; one tripped over a landscape timber) Bradykinesia symptoms: shuffling gait (patient attributes to nerve damage from previous back surgery) Loss of smell:  No. Loss of taste:  No. Urinary Incontinence:  No. Difficulty Swallowing:  No. Handwriting, micrographia: Yes.   Trouble with ADL's:  Yes.   (just slower than in the past  Trouble buttoning clothing: Yes.   Depression:  Yes.   (more frustration; placed on cymbalta for pain and associated irritabiltiy) Memory changes:  Yes.   (minor issues - trouble with names) Hallucinations:  No.  visual distortions: No. N/V:  No. Diplopia:  No. Dyskinesia:  No.  Allergies  Allergen Reactions  . Floxin [Ofloxacin] Other (See Comments)    Lowers BP    Outpatient Encounter Prescriptions as of 07/21/2016  Medication Sig  . albuterol (PROVENTIL HFA;VENTOLIN HFA) 108 (90 BASE) MCG/ACT inhaler Inhale 2 puffs into the lungs every 4 (four) hours as needed for wheezing.   . Cholecalciferol (VITAMIN D) 2000 units CAPS Take 2,000 Units by mouth daily.  . clopidogrel (PLAVIX) 75 MG tablet Take 1 tablet (75 mg total) by mouth daily.  [name] Calcium (STOOL SOFTENER PO) Take 1 capsule by mouth 2 (two) times daily.   . DULoxetine (CYMBALTA) 60 MG capsule Take by mouth daily.   [name] DYMISTA 137-50 MCG/ACT SUSP Place 1 puff into both nostrils at bedtime.   . finasteride (PROSCAR)  5 MG tablet Take 5 mg by mouth Daily.  . fish oil-omega-3 fatty acids 1000 MG capsule Take 1 g by mouth.   . flecainide (TAMBOCOR) 150 MG tablet TAKE 1/2 TABLET BY MOUTH EVERY 12 HOURS  . hydrocortisone (ANUSOL-HC) 2.5 % rectal cream Place 1 application rectally 2 (two) times daily.  . lansoprazole (PREVACID) 30 MG capsule TAKE ONE CAPSULE BY MOUTH TWICE A DAY  . levocetirizine (XYZAL) 5 MG tablet Take 5 mg by mouth every evening.    . montelukast (SINGULAIR) 10 MG tablet Take 10 mg by mouth at bedtime.    [name] oxyCODONE-acetaminophen (PERCOCET/ROXICET) 5-325 MG tablet Take 1 tablet by mouth every 6 (six) hours as needed.  . primidone (MYSOLINE) 50 MG tablet Take 25 mg by mouth 2 (two) times daily.  . propranolol (INDERAL) 20 MG tablet Take 20 mg by mouth 2 (two) times daily.  . rosuvastatin (CRESTOR) 10 MG tablet Take 5 mg by mouth daily. Take 1/2 tablet daily  . tamsulosin (FLOMAX) 0.4 MG CAPS capsule Take 0.4 mg by mouth daily after supper.   . zolpidem (AMBIEN) 10 MG tablet Take 10 mg by mouth at bedtime.  . [DISCONTINUED] dicyclomine (BENTYL) 10 MG capsule Take 10 mg by mouth 3 (three) times daily as needed for spasms.  . [DISCONTINUED] metoprolol succinate (TOPROL-XL) 25 MG 24 hr tablet TAKE 1 TABLET BY MOUTH EVERY DAY  . [DISCONTINUED] metoprolol succinate (TOPROL-XL) 25 MG 24 hr tablet Take 25 mg by mouth daily.  . [DISCONTINUED] primidone (MYSOLINE) 50 MG tablet Take 3 tablets (150 mg total) by mouth daily. (Patient taking differently: Take 100 mg by mouth daily. )  . [DISCONTINUED] primidone (MYSOLINE) 50 MG tablet TAKE 2 TABLETS BY MOUTH EVERY DAY  . [DISCONTINUED] propranolol (INDERAL) 40 MG tablet Take 1 tablet (40 mg total) by mouth 2 (two) times daily.   Facility-Administered Encounter Medications as of 07/21/2016  Medication  . 0.9 %  sodium chloride infusion    Past Medical History:  Diagnosis Date  . Allergic rhinitis   . Allergy   . Anal fissure   . Anxiety    from  chronic pain from surgery- on Cymbalta  . Arthritis   . Asthma   . Barrett's esophagus 03/29/2014  . Carotid artery disease ([place])    Carotid Doppler normal August, 2007  . Cataract   . Chest pain    Coronaries normal 1996 /  nuclear..06/2002..normal...EF  56% /  stress echo.. May, 2011.... no  scar or ischemia... rate related RBBB  . Diverticulosis   . Dyslipidemia   . Ejection fraction    EF 60%, stress echo, May, 2011  . GERD (gastroesophageal reflux disease)   . Headache   .  History of loop recorder    has since 04/04/15  . HTN (hypertension)    takes Metoprolol for PVC control  . Hx of colonic polyps    adenomatous  . Hx of colonoscopy   . Hyperlipidemia   . IFG (impaired fasting glucose)   . Palpitations    Benign PVCs  . RBBB (right bundle branch block)    rate related  . Shingles   . Sleep apnea    pt denies  . Stroke [facility])    TIA  . TIA (transient ischemic attack) 02/2015   Per pt, had 2 strokes  . Tremor    Hand tremor  . Vertigo     Past Surgical History:  Procedure Laterality Date  . BACK SURGERY  2002,2009   x 6  . CHOLECYSTECTOMY  11/30/2012   with IOC  . COLONOSCOPY    . ELECTROPHYSIOLOGIC STUDY N/A 09/26/2015   Procedure: V Tach Ablation (PVC);  Surgeon: Will Meredith Leeds, MD;  Location: [place] [facility];  Service: Cardiovascular;  Laterality: N/A;  . EP IMPLANTABLE DEVICE N/A 04/04/2015   Procedure: Loop Recorder Insertion;  Surgeon: Will Meredith Leeds, MD;  Location: [place] [facility];  Service: Cardiovascular;  Laterality: N/A;  . HERNIA REPAIR     laprascopic  . KNEE SURGERY     DECEMBER 2017,LEFT KNEE SCOPED  . NECK SURGERY  2002  . POLYPECTOMY    . ROTATOR CUFF REPAIR Left   . TEE WITHOUT CARDIOVERSION N/A 04/04/2015   Procedure: TRANSESOPHAGEAL ECHOCARDIOGRAM (TEE);  Surgeon: Lelon Perla, MD;  Location: [facility] ENDOSCOPY;  Service: Cardiovascular;  Laterality: N/A;  . UPPER GASTROINTESTINAL ENDOSCOPY    . V Tach ablation  09/26/2015     Social History   Social History  . Marital status: Married    Spouse name: [name]   . Number of children: 2  . Years of education: 15   Occupational History  . Retired     Dance movement psychotherapist   Social History Main Topics  . Smoking status: Never Smoker  . Smokeless tobacco: Never Used  . Alcohol use No  . Drug use: No  . Sexual activity: Not on file   Other Topics Concern  . Not on file   Social History Narrative   Lives with wife.    Caffeine use: Coffee/tea/soda- ocass   Right-handed    Family Status  Relation Status  . Father Deceased at age 51  . Brother Alive       twin brother  . Mother Deceased  . PGM Deceased  . PGF (Not Specified)  . Brother Alive  . Daughter Alive  . Daughter Alive  . Neg Hx (Not Specified)    Review of Systems L leg weaker than R from back surgeries.  A complete 10 system ROS was obtained and was negative apart from what is mentioned.   Objective:   VITALS:   Vitals:   07/21/16 1331  BP: 138/82  Pulse: 84  Weight: 229 lb (103.9 kg)  Height: 6\' 1"  (1.854 m)   Gen:  Appears stated age and in NAD. HEENT:  Normocephalic, atraumatic. The mucous membranes are moist. The superficial temporal arteries are without ropiness or tenderness. Cardiovascular: Regular rate and rhythm. Lungs: Clear to auscultation bilaterally. Neck: There are no carotid bruits noted bilaterally.  NEUROLOGICAL:  Orientation:  The patient is alert and oriented x 3.  Recent and remote memory are intact.  Attention span and concentration are normal.  Able  to name objects and repeat without trouble.  Fund of knowledge is appropriate Cranial nerves: There is good facial symmetry. There is facial hypomimia.  The pupils are equal round and reactive to light bilaterally. Fundoscopic exam reveals clear disc margins bilaterally. Pt has esotropia on the R and can elicit diplopia with far L gaze.   visual fields are full to confrontational testing. Speech is fluent and  clear.  The patient is able to make the gutteral sounds without difficulty.   Soft palate rises symmetrically and there is no tongue deviation. Hearing is intact to conversational tone. Tone: slight increase in tone with activation on the right Sensation: Sensation is intact to light touch and pinprick throughout (facial, trunk, extremities). Vibration is intact at the bilateral big toe but slightly decreased. There is no extinction with double simultaneous stimulation. There is no sensory dermatomal level identified. Coordination:  The patient has no dysmetria.  Has slight decremation with alternation of supination/pronation on the right.  All other RAMs are normal Motor: Strength is 5/5 in the bilateral upper and lower extremities.  Shoulder shrug is equal bilaterally.  There is no pronator drift.  There are no fasciculations noted. DTR's: Deep tendon reflexes are 2/4 at the bilateral biceps, triceps, brachioradialis, patella and achilles.  Plantar responses are downgoing bilaterally. Gait and Station: The patient has mild trouble arising without the use of the hands.  When he ambulates he has a re-emergent tremor on the right.    MOVEMENT EXAM: Tremor:  There is a rest tremor on the right and rarely on the L.  There is a postural tremor that decreases with intention.  He has chin tremor when distracted  ADDENDUM LABS:  Received lab work from primary care physician dated 06/10/2016.  Sodium was 139, potassium 4.9, chloride 105, CO2 26, BUN 12, creatinine 0.8.  White blood cells 5.1, hemoglobin 14.8, hematocrit 44.0, platelets 215.  AST 17, ALT 16, alkaline phosphatase 93.  TSH was 2.18.  Hemoglobin A1c was 5.4.     Assessment/Plan:   1.  Parkinsonism  -While he does have a long history of essential tremor and patients with long histories of essential tremor can develop a resting tremor, I am concerned that he has something more going on.  He does not meet criteria for formal Parkinson's disease,  but I discussed with him that I am somewhat concerned for an atypical state.  I was able to elicit some diplopia on examination and he had disconjugate eye movements.  He also has had syncope.  These, along with resting tremor (noted in both hands and chin) are somewhat concerning for an atypical state.  I do think he needs a further workup.  Before doing lab work, I told him I would try to get a copy of lab work from his primary care physician.  He and I talked about the utility of DaT scan.  I discussed with the patient that this is not a diagnostic scan, but may be useful in his case.  He would like to proceed.  -I'm going to refer him to ophthalmology to have them closely examine extraocular muscle function.    2.  Cerebral infarct (watershed territory) 02/2015  - Now has loop recorder.  TEE with PFO.  No indication to close given age and RoPE score of 5.   Carotid u/s normal.  LDL 75 at time of event. On plavix.    3.  Syncope  -as above, multiple episodes.  May be due to  meds (propranolol, flomax) but has had several episodes and wonder if part of larger disease state.  Should not be driving (although all events have been while standing).  4.  F/u after above completed.  Told him that he could also f/u with Dr. Jaynee Eagles at previously scheduled appt.  Much greater than 50% of this visit was spent in counseling and coordinating care.  Total face to face time:  65 min   CC:  Marton Redwood, MD

## 2016-07-21 ENCOUNTER — Ambulatory Visit (INDEPENDENT_AMBULATORY_CARE_PROVIDER_SITE_OTHER): Payer: Medicare HMO | Admitting: Neurology

## 2016-07-21 ENCOUNTER — Encounter: Payer: Self-pay | Admitting: Neurology

## 2016-07-21 VITALS — BP 138/82 | HR 84 | Ht 73.0 in | Wt 229.0 lb

## 2016-07-21 DIAGNOSIS — G20 Parkinson's disease: Secondary | ICD-10-CM

## 2016-07-21 DIAGNOSIS — H532 Diplopia: Secondary | ICD-10-CM

## 2016-07-21 DIAGNOSIS — R55 Syncope and collapse: Secondary | ICD-10-CM

## 2016-07-21 DIAGNOSIS — R251 Tremor, unspecified: Secondary | ICD-10-CM

## 2016-07-21 DIAGNOSIS — G250 Essential tremor: Secondary | ICD-10-CM

## 2016-07-21 NOTE — Patient Instructions (Addendum)
1. We have sent a referral to [facility] for your DAT scan and they will call you directly to schedule your appt.. They are located at [address] If you need to contact them directly please call [phone].  2. We have scheduled you at [facility] with Dr. Jola Schmidt on 09/17/16 at 2:45 pm.. They are located at [place], [place]. If this is not a good date/time they can be reached at [phone] to reschedule.

## 2016-07-23 ENCOUNTER — Telehealth: Payer: Self-pay | Admitting: Radiology

## 2016-07-27 ENCOUNTER — Ambulatory Visit (INDEPENDENT_AMBULATORY_CARE_PROVIDER_SITE_OTHER): Payer: Medicare HMO | Admitting: *Deleted

## 2016-07-27 DIAGNOSIS — I639 Cerebral infarction, unspecified: Secondary | ICD-10-CM | POA: Diagnosis not present

## 2016-07-28 NOTE — Progress Notes (Signed)
Carelink Summary Report / Loop Recorder 

## 2016-08-03 LAB — CUP PACEART REMOTE DEVICE CHECK
Date Time Interrogation Session: 20180716201332
Implantable Pulse Generator Implant Date: 20170323

## 2016-08-03 NOTE — Progress Notes (Signed)
Carelink summary report received. Battery status OK. Normal device function. No new symptom episodes, tachy episodes, brady, or pause episodes. No new AF episodes. Monthly summary reports and ROV/PRN 

## 2016-08-05 DIAGNOSIS — M65342 Trigger finger, left ring finger: Secondary | ICD-10-CM | POA: Diagnosis not present

## 2016-08-05 DIAGNOSIS — M180 Bilateral primary osteoarthritis of first carpometacarpal joints: Secondary | ICD-10-CM | POA: Diagnosis not present

## 2016-08-26 ENCOUNTER — Ambulatory Visit (INDEPENDENT_AMBULATORY_CARE_PROVIDER_SITE_OTHER): Payer: Medicare HMO | Admitting: *Deleted

## 2016-08-26 DIAGNOSIS — I639 Cerebral infarction, unspecified: Secondary | ICD-10-CM

## 2016-08-27 ENCOUNTER — Encounter (HOSPITAL_COMMUNITY)
Admission: RE | Admit: 2016-08-27 | Discharge: 2016-08-27 | Disposition: A | Discharge status: Home / Self Care | Payer: Medicare HMO | Source: Ambulatory Visit | Attending: Neurology | Admitting: Neurology

## 2016-08-27 DIAGNOSIS — H532 Diplopia: Secondary | ICD-10-CM | POA: Diagnosis not present

## 2016-08-27 DIAGNOSIS — R251 Tremor, unspecified: Secondary | ICD-10-CM

## 2016-08-27 DIAGNOSIS — R262 Difficulty in walking, not elsewhere classified: Secondary | ICD-10-CM | POA: Diagnosis not present

## 2016-08-27 MED ORDER — IODINE STRONG (LUGOLS) 5 % PO SOLN
0.8000 mL | Freq: Once | ORAL | Status: AC
  Administered 2016-08-27: 0.8 mL via ORAL
  Filled 2016-08-27: qty 0.8

## 2016-08-27 MED ORDER — IOFLUPANE I 123 185 MBQ/2.5ML IV SOLN
4.6000 | Freq: Once | INTRAVENOUS | Status: AC
  Administered 2016-08-27: 4.6 via INTRAVENOUS

## 2016-08-28 ENCOUNTER — Telehealth: Payer: Self-pay | Admitting: Neurology

## 2016-08-28 NOTE — Telephone Encounter (Signed)
-----   Message from [place], DO sent at 08/28/2016  7:52 AM EDT ----- Put patient on next Wednesday to go over DaT scan

## 2016-08-28 NOTE — Telephone Encounter (Addendum)
Appt made 09/02/16 at 9:45 am. Patient made aware.

## 2016-08-31 NOTE — Progress Notes (Signed)
Subjective:   Joshua [name] was seen in consultation in the movement disorder clinic at the request of Marton Redwood, MD.  The evaluation is for tremor.  Pt currently seeing Dr. Jaynee Eagles and presents for second opinion to see if perhaps has PD.  Has seen Dr. Jacelyn Grip in the past. Patient is a 71 year old male with a history of vertigo, embolic stroke in February, 2017 and ophthalmic migraine who presents today for evaluation of his essential tremor.  Tremor started when he was in his 17s, but has been getting slowly but progressively worse with the course of time.  In the past at the onset, tremor was in the R hand only and it would shake with fine motor coordination and with stress.  States that rest tremor on the R seemed to start about 3 months after the embolic infarct.    Tremor is most noticeable when at rest now.  Infarct involved tiny acute infarcts in the L motor strip and L post central gyrus (was watershed territory).    Now has loop recorder.  TEE with PFO.   Carotid u/s normal.  LDL 75 at time of event.  There is a family hx of tremor in his identical twin brother.    Tremor:   Affected by caffeine:  No. (2 cups coffee per day) Affected by alcohol:  Doesn't drink alcohol Affected by stress:  No. Affected by fatigue:  Yes.   Spills soup if on spoon:  Yes.   Spills glass of liquid if full:  No. But carries with 2 hands   Pt tells me about 4 different passing out episodes.  The first was many years ago after a back surgery and he dropped something and went to pick it up and passed out and it was attributed to going off of percocet.  The second epiosde occurred the Saturday following his stroke in 02/2015.  He was standing up in utility room and without warning he passed out and he woke up right when he hit the floor.  The third episode was about 3 weeks ago and he walked into the bedroom and got his back brace and passed out.  He woke up right when he hit the floor.  Dr. Raul Del records indicate  that he felt that his syncope was likely related to orthostasis in the setting of Flomax, his beta blocker and his pain medications.  He did recommend that the patient follow-up with cardiology and consider alternatives to the beta blocker and Flomax.   Current/Previously tried tremor medications: primidone - unable to tolerate above 50 mg bid; propranolol 20 mg bid; metoprolol 25 mg daily  Voice: weaker; raspy Sleep: sleeping well with [place]  Vivid Dreams:  No.  Acting out dreams:  Yes.   Wet Pillows: No. Postural symptoms:  Yes.    Falls?  Yes.  , 2 falls (one missed a step; one tripped over a landscape timber) Bradykinesia symptoms: shuffling gait (patient attributes to nerve damage from previous back surgery) Loss of smell:  No. Loss of taste:  No. Urinary Incontinence:  No. Difficulty Swallowing:  No. Handwriting, micrographia: Yes.   Trouble with ADL's:  Yes.   (just slower than in the past  Trouble buttoning clothing: Yes.   Depression:  Yes.   (more frustration; placed on cymbalta for pain and associated irritabiltiy) Memory changes:  Yes.   (minor issues - trouble with names) Hallucinations:  No.  visual distortions: No. N/V:  No. Diplopia:  No. Dyskinesia:  No.  09/02/16 update:  Patient seen today in follow-up for tremor.  This patient is accompanied in the office by his spouse who supplements the history.  He had a DaT scan on 08/27/2016.  I reviewed this.  There was loss of signal in the left and right putamen.  His opthalmology appointment is 09/17/16.  He is seeing Dr. Valetta Close he had 2 near falls.  Both of them he felt that he was going to fall to the left and if he was not standing in a store where he ended up catching himself on a shelf, he feels that he would have fallen in one instance.  He really did not feel near syncopal in this instance..     Allergies  Allergen Reactions  . Floxin [Ofloxacin] Other (See Comments)    Lowers BP    Outpatient Encounter  Prescriptions as of 09/02/2016  Medication Sig  . albuterol (PROVENTIL HFA;VENTOLIN HFA) 108 (90 BASE) MCG/ACT inhaler Inhale 2 puffs into the lungs every 4 (four) hours as needed for wheezing.   . Cholecalciferol (VITAMIN D) 2000 units CAPS Take 2,000 Units by mouth daily.  . clopidogrel (PLAVIX) 75 MG tablet Take 1 tablet (75 mg total) by mouth daily.  [name] Calcium (STOOL SOFTENER PO) Take 1 capsule by mouth 2 (two) times daily.   . DULoxetine (CYMBALTA) 60 MG capsule Take by mouth daily.   [name] DYMISTA 137-50 MCG/ACT SUSP Place 1 puff into both nostrils at bedtime.   . finasteride (PROSCAR) 5 MG tablet Take 5 mg by mouth Daily.  . fish oil-omega-3 fatty acids 1000 MG capsule Take 1 g by mouth.   . flecainide (TAMBOCOR) 150 MG tablet TAKE 1/2 TABLET BY MOUTH EVERY 12 HOURS  . hydrocortisone (ANUSOL-HC) 2.5 % rectal cream Place 1 application rectally 2 (two) times daily.  . lansoprazole (PREVACID) 30 MG capsule TAKE ONE CAPSULE BY MOUTH TWICE A DAY  . levocetirizine (XYZAL) 5 MG tablet Take 5 mg by mouth every evening.    . montelukast (SINGULAIR) 10 MG tablet Take 10 mg by mouth at bedtime.    [name] oxyCODONE-acetaminophen (PERCOCET/ROXICET) 5-325 MG tablet Take 1 tablet by mouth every 6 (six) hours as needed.  . primidone (MYSOLINE) 50 MG tablet Take 25 mg by mouth 2 (two) times daily.  . propranolol (INDERAL) 20 MG tablet Take 20 mg by mouth 2 (two) times daily.  . rosuvastatin (CRESTOR) 10 MG tablet Take 5 mg by mouth daily. Take 1/2 tablet daily  . tamsulosin (FLOMAX) 0.4 MG CAPS capsule Take 0.4 mg by mouth daily after supper.   . zolpidem (AMBIEN) 10 MG tablet Take 10 mg by mouth at bedtime.   Facility-Administered Encounter Medications as of 09/02/2016  Medication  . 0.9 %  sodium chloride infusion    Past Medical History:  Diagnosis Date  . Allergic rhinitis   . Allergy   . Anal fissure   . Anxiety    from chronic pain from surgery- on Cymbalta  . Arthritis   . Asthma   .  Barrett's esophagus 03/29/2014  . Carotid artery disease ([place])    Carotid Doppler normal August, 2007  . Cataract   . Chest pain    Coronaries normal 1996 /  nuclear..06/2002..normal...EF  56% /  stress echo.. May, 2011.... no  scar or ischemia... rate related RBBB  . Diverticulosis   . Dyslipidemia   . Ejection fraction    EF 60%, stress echo, May, 2011  . GERD (gastroesophageal reflux disease)   .  Headache   . History of loop recorder    has since 04/04/15  . HTN (hypertension)    takes Metoprolol for PVC control  . Hx of colonic polyps    adenomatous  . Hx of colonoscopy   . Hyperlipidemia   . IFG (impaired fasting glucose)   . Palpitations    Benign PVCs  . RBBB (right bundle branch block)    rate related  . Shingles   . Sleep apnea    pt denies  . Stroke [facility])    TIA  . TIA (transient ischemic attack) 02/2015   Per pt, had 2 strokes  . Tremor    Hand tremor  . Vertigo     Past Surgical History:  Procedure Laterality Date  . BACK SURGERY  2002,2009   x 6  . CHOLECYSTECTOMY  11/30/2012   with IOC  . COLONOSCOPY    . ELECTROPHYSIOLOGIC STUDY N/A 09/26/2015   Procedure: V Tach Ablation (PVC);  Surgeon: Will Meredith Leeds, MD;  Location: [place] [facility];  Service: Cardiovascular;  Laterality: N/A;  . EP IMPLANTABLE DEVICE N/A 04/04/2015   Procedure: Loop Recorder Insertion;  Surgeon: Will Meredith Leeds, MD;  Location: [place] [facility];  Service: Cardiovascular;  Laterality: N/A;  . HERNIA REPAIR     laprascopic  . KNEE SURGERY     DECEMBER 2017,LEFT KNEE SCOPED  . NECK SURGERY  2002  . POLYPECTOMY    . ROTATOR CUFF REPAIR Left   . TEE WITHOUT CARDIOVERSION N/A 04/04/2015   Procedure: TRANSESOPHAGEAL ECHOCARDIOGRAM (TEE);  Surgeon: Lelon Perla, MD;  Location: [facility] ENDOSCOPY;  Service: Cardiovascular;  Laterality: N/A;  . UPPER GASTROINTESTINAL ENDOSCOPY    . V Tach ablation  09/26/2015    Social History   Social History  . Marital status: Married      Spouse name: [name]   . Number of children: 2  . Years of education: 15   Occupational History  . Retired     Dance movement psychotherapist   Social History Main Topics  . Smoking status: Never Smoker  . Smokeless tobacco: Never Used  . Alcohol use No  . Drug use: No  . Sexual activity: Not on file   Other Topics Concern  . Not on file   Social History Narrative   Lives with wife.    Caffeine use: Coffee/tea/soda- ocass   Right-handed    Family Status  Relation Status  . Father Deceased at age 92  . Brother Alive       twin brother  . Mother Deceased  . PGM Deceased  . PGF (Not Specified)  . Brother Alive  . Daughter Alive  . Daughter Alive  . Neg Hx (Not Specified)    Review of Systems L leg weaker than R from back surgeries. Does have chronic back pain.  Is exercising within his limits, including riding a recumbent bike.   A complete 10 system ROS was obtained and was negative apart from what is mentioned.   Objective:   VITALS:   Vitals:   09/02/16 0942  BP: 110/80  Pulse: (!) 52  SpO2: 98%  Weight: 230 lb (104.3 kg)  Height: 6\' 1"  (1.854 m)   Gen:  Appears stated age and in NAD. HEENT:  Normocephalic, atraumatic. The mucous membranes are moist. The superficial temporal arteries are without ropiness or tenderness. Cardiovascular: Bradycardic.  Regular.   Lungs: Clear to auscultation bilaterally. Neck: There are no carotid bruits noted bilaterally.  NEUROLOGICAL:  Orientation:  The patient is alert and oriented x 3.   Cranial nerves: There is good facial symmetry. There is facial hypomimia.   Pt has esotropia on the R and can elicit diplopia with far L gaze.   visual fields are full to confrontational testing. Speech is fluent and clear.  The patient is able to make the gutteral sounds without difficulty.   Soft palate rises symmetrically and there is no tongue deviation. Hearing is intact to conversational tone. Tone: slight increase in tone with activation on the  right Sensation: Sensation is intact to light touch throughout.   Coordination:  The patient has no dysmetria.  Has slight decremation with alternation of supination/pronation on the right and finger taps on the right .  All other RAMs are normal Motor: Strength is 5/5 in the bilateral upper and lower extremities.  Shoulder shrug is equal bilaterally.  There is no pronator drift.  There are no fasciculations noted. Gait and Station: The patient has mild trouble arising without the use of the hands.  When he ambulates he has a re-emergent tremor on the right.    MOVEMENT EXAM: Tremor:  There is a rest tremor on the right and rarely on the L.  There is a postural tremor that decreases with intention.  He has chin tremor when distracted  ADDENDUM LABS:  Received lab work from primary care physician dated 06/10/2016.  Sodium was 139, potassium 4.9, chloride 105, CO2 26, BUN 12, creatinine 0.8.  White blood cells 5.1, hemoglobin 14.8, hematocrit 44.0, platelets 215.  AST 17, ALT 16, alkaline phosphatase 93.  TSH was 2.18.  Hemoglobin A1c was 5.4.  No results found for: VITAMINB12    Assessment/Plan:   1.  Parkinsonism  -While he does have a long history of essential tremor and patients with long histories of essential tremor can develop a resting tremor, I am concerned that he has something more going on.  He does not meet criteria for formal Parkinson's disease, but I discussed with him that I am somewhat concerned for an atypical state.  I was able to elicit some diplopia on examination and he had disconjugate eye movements.  He also has had syncope.  These, along with resting tremor (noted in both hands and chin) are somewhat concerning for an atypical state.  ET/PD is in the ddx but I would not expect to see atypical features in these patients.  Time will certainly allow [place] to differentiate these.  -DaT scan positive for loss of DA in bilateral putamen in 08/2016.  I showed the patient and his wife  the films and reiterated that this was not a diagnostic test.  I talked to him about my suspicions for atypical state.    -labs today:  Vit E, Zn++, anti-gliadin Ab's, B12, folate, RPR  -He does have an appointment with Dr. Valetta Close upcoming.     2.  Cerebral infarct (watershed territory) 02/2015  - Now has loop recorder.  TEE with PFO.  No indication to close given age and RoPE score of 5.   Carotid u/s normal.  LDL 75 at time of event. On plavix.    3.  Syncope  -as above, multiple episodes.  May be due to meds (propranolol, flomax) but has had several episodes and wonder if part of larger disease state, as atypical parkinsonism can cause this.  Should not be driving (although all events have been while standing).  -He is bradycardic.  He states that he is using  the propranolol both for tremor as well as for his heart.  At this point, I told him I would not recommend this for tremor, but will email his cardiologist to see if he needs that  4.  Has f/u in October and will keep that.  Much greater than 50% of this visit was spent in counseling and coordinating care.  Total face to face time:  25 min    CC:  Marton Redwood, MD

## 2016-09-01 DIAGNOSIS — H1045 Other chronic allergic conjunctivitis: Secondary | ICD-10-CM | POA: Diagnosis not present

## 2016-09-01 DIAGNOSIS — J4520 Mild intermittent asthma, uncomplicated: Secondary | ICD-10-CM | POA: Diagnosis not present

## 2016-09-01 DIAGNOSIS — J3089 Other allergic rhinitis: Secondary | ICD-10-CM | POA: Diagnosis not present

## 2016-09-02 ENCOUNTER — Ambulatory Visit (INDEPENDENT_AMBULATORY_CARE_PROVIDER_SITE_OTHER): Payer: Medicare HMO | Admitting: Neurology

## 2016-09-02 ENCOUNTER — Encounter: Payer: Self-pay | Admitting: Neurology

## 2016-09-02 ENCOUNTER — Other Ambulatory Visit: Payer: Medicare HMO

## 2016-09-02 VITALS — BP 110/80 | HR 52 | Ht 73.0 in | Wt 230.0 lb

## 2016-09-02 DIAGNOSIS — Z5181 Encounter for therapeutic drug level monitoring: Secondary | ICD-10-CM

## 2016-09-02 DIAGNOSIS — R531 Weakness: Secondary | ICD-10-CM

## 2016-09-02 DIAGNOSIS — R5383 Other fatigue: Secondary | ICD-10-CM

## 2016-09-02 DIAGNOSIS — R6889 Other general symptoms and signs: Secondary | ICD-10-CM | POA: Diagnosis not present

## 2016-09-02 DIAGNOSIS — G20 Parkinson's disease: Secondary | ICD-10-CM

## 2016-09-02 NOTE — Patient Instructions (Signed)
1. Your provider has requested that you have labwork completed today. Please go to  Endocrinology ([address]) on the second floor of this building before leaving the office today. You do not need to check in. If you are not called within 15 minutes please check with the front desk.   

## 2016-09-03 LAB — CUP PACEART REMOTE DEVICE CHECK
Date Time Interrogation Session: 20180815203952
Implantable Pulse Generator Implant Date: 20170323

## 2016-09-03 LAB — RPR

## 2016-09-03 LAB — GLIADIN ANTIBODIES, SERUM
Gliadin IgA: 28 Units — ABNORMAL HIGH (ref <20)
Gliadin IgG: 9 Units (ref <20)

## 2016-09-03 LAB — VITAMIN B12: Vitamin B-12: 493 pg/mL (ref 200–1100)

## 2016-09-03 LAB — FOLATE: Folate: 18.7 ng/mL (ref >5.4)

## 2016-09-03 NOTE — Progress Notes (Signed)
Loop recorder summary report 

## 2016-09-07 DIAGNOSIS — M65342 Trigger finger, left ring finger: Secondary | ICD-10-CM | POA: Diagnosis not present

## 2016-09-10 LAB — VITAMIN E
Gamma-Tocopherol (Vit E): <1 mg/L (ref <=4.3)
Vitamin E (Alpha Tocopherol): 10.8 mg/L (ref 5.7–19.9)

## 2016-09-10 LAB — ZINC: Zinc: 67 ug/dL (ref 60–130)

## 2016-09-17 DIAGNOSIS — H532 Diplopia: Secondary | ICD-10-CM | POA: Diagnosis not present

## 2016-09-17 DIAGNOSIS — H5203 Hypermetropia, bilateral: Secondary | ICD-10-CM | POA: Diagnosis not present

## 2016-09-22 DIAGNOSIS — M4716 Other spondylosis with myelopathy, lumbar region: Secondary | ICD-10-CM | POA: Diagnosis not present

## 2016-09-25 ENCOUNTER — Ambulatory Visit (INDEPENDENT_AMBULATORY_CARE_PROVIDER_SITE_OTHER): Payer: Medicare HMO | Admitting: *Deleted

## 2016-09-25 DIAGNOSIS — I639 Cerebral infarction, unspecified: Secondary | ICD-10-CM | POA: Diagnosis not present

## 2016-09-28 NOTE — Progress Notes (Signed)
Carelink Summary Report / Loop Recorder 

## 2016-09-29 LAB — CUP PACEART REMOTE DEVICE CHECK
Date Time Interrogation Session: 20180914214023
Implantable Pulse Generator Implant Date: 20170323

## 2016-10-11 NOTE — Progress Notes (Signed)
Electrophysiology Office Note   Date:  10/12/2016   ID:  Joshua [name], DOB [birth year], MRN [id number]  PCP:  Marton Redwood, MD  Primary Electrophysiologist:  Joyce Leckey Meredith Leeds, MD    Chief Complaint  Patient presents with  . Pacemaker Check    Loop recorder/PVC's     History of Present Illness: Joshua [name] is a 71 y.o. male who presents today for electrophysiology evaluation.   He has a history of CVA, OSA, HLD, GERD, HTN, RBBB.  His stroke occurred 02/2015.  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  A linq monitor was implanted. He was found to have an excessive number of PVCs, up to 26% on Holter monitor. He had ablation of his PVCs with return of PVCs post ablation and was placed on flecainide. He was previously on metoprolol which was switched to propranolol due to an essential tremor.    Today, denies symptoms of palpitations, chest pain, shortness of breath, orthopnea, PND, lower extremity edema, claudication, dizziness, presyncope, syncope, bleeding, or neurologic sequela. The patient is tolerating medications without difficulties. Unfortunately, he has been diagnosed with atypical Parkinson's. He is currently undergoing a workup and [name] meet with his neurologist later this month. Otherwise, he is feeling well.    Past Medical History:  Diagnosis Date  . Allergic rhinitis   . Allergy   . Anal fissure   . Anxiety    from chronic pain from surgery- on Cymbalta  . Arthritis   . Asthma   . Barrett's esophagus 03/29/2014  . Carotid artery disease ([place])    Carotid Doppler normal August, 2007  . Cataract   . Chest pain    Coronaries normal 1996 /  nuclear..06/2002..normal...EF  56% /  stress echo.. May, 2011.... no  scar or ischemia... rate related RBBB  . Diverticulosis   . Dyslipidemia   . Ejection fraction    EF 60%, stress echo, May, 2011  . GERD (gastroesophageal reflux disease)   .  Headache   . History of loop recorder    has since 04/04/15  . HTN (hypertension)    takes Metoprolol for PVC control  . Hx of colonic polyps    adenomatous  . Hx of colonoscopy   . Hyperlipidemia   . IFG (impaired fasting glucose)   . Palpitations    Benign PVCs  . RBBB (right bundle branch block)    rate related  . Shingles   . Sleep apnea    pt denies  . Stroke [facility])    TIA  . TIA (transient ischemic attack) 02/2015   Per pt, had 2 strokes  . Tremor    Hand tremor  . Vertigo    Past Surgical History:  Procedure Laterality Date  . BACK SURGERY  2002,2009   x 6  . CHOLECYSTECTOMY  11/30/2012   with IOC  . COLONOSCOPY    . ELECTROPHYSIOLOGIC STUDY N/A 09/26/2015   Procedure: V Tach Ablation (PVC);  Surgeon: Rashonda Warrior Meredith Leeds, MD;  Location: [place] [facility];  Service: Cardiovascular;  Laterality: N/A;  . EP IMPLANTABLE DEVICE N/A 04/04/2015   Procedure: Loop Recorder Insertion;  Surgeon: Cyd Hostler Meredith Leeds, MD;  Location: [place] [facility];  Service: Cardiovascular;  Laterality: N/A;  . HERNIA REPAIR     laprascopic  . KNEE SURGERY     DECEMBER 2017,LEFT KNEE SCOPED  . NECK SURGERY  2002  .  POLYPECTOMY    . ROTATOR CUFF REPAIR Left   . TEE WITHOUT CARDIOVERSION N/A 04/04/2015   Procedure: TRANSESOPHAGEAL ECHOCARDIOGRAM (TEE);  Surgeon: Lelon Perla, MD;  [facility] ENDOSCOPY;  Service: Cardiovascular;  Laterality: N/A;  . UPPER GASTROINTESTINAL ENDOSCOPY    . V Tach ablation  09/26/2015     Current Outpatient Prescriptions  Medication Sig Dispense Refill  . albuterol (PROVENTIL HFA;VENTOLIN HFA) 108 (90 BASE) MCG/ACT inhaler Inhale 2 puffs into the lungs every 4 (four) hours as needed for wheezing.     . Cholecalciferol (VITAMIN D) 2000 units CAPS Take 2,000 Units by mouth daily.    . clopidogrel (PLAVIX) 75 MG tablet Take 1 tablet (75 mg total) by mouth daily. 30 tablet 2  . Docusate Calcium (STOOL SOFTENER PO) Take 1 capsule by mouth 2 (two) times  daily.     . DULoxetine (CYMBALTA) 60 MG capsule Take by mouth daily.     [name] DYMISTA 137-50 MCG/ACT SUSP Place 1 puff into both nostrils at bedtime.     . finasteride (PROSCAR) 5 MG tablet Take 5 mg by mouth Daily.    . fish oil-omega-3 fatty acids 1000 MG capsule Take 1 g by mouth.     . flecainide (TAMBOCOR) 150 MG tablet TAKE 1/2 TABLET BY MOUTH EVERY 12 HOURS 30 tablet 10  . hydrocortisone (ANUSOL-HC) 2.5 % rectal cream Place 1 application rectally 2 (two) times daily. 30 g 1  . lansoprazole (PREVACID) 30 MG capsule TAKE ONE CAPSULE BY MOUTH TWICE A DAY 180 capsule 1  . levocetirizine (XYZAL) 5 MG tablet Take 5 mg by mouth every evening.      . montelukast (SINGULAIR) 10 MG tablet Take 10 mg by mouth at bedtime.      [name] oxyCODONE-acetaminophen (PERCOCET/ROXICET) 5-325 MG tablet Take 1 tablet by mouth every 6 (six) hours as needed.  0  . primidone (MYSOLINE) 50 MG tablet Take 25 mg by mouth 2 (two) times daily.  11  . propranolol (INDERAL) 20 MG tablet Take 20 mg by mouth 2 (two) times daily.  11  . rosuvastatin (CRESTOR) 10 MG tablet Take 5 mg by mouth daily. Take 1/2 tablet daily    . tamsulosin (FLOMAX) 0.4 MG CAPS capsule Take 0.4 mg by mouth daily after supper.     . zolpidem (AMBIEN) 10 MG tablet Take 10 mg by mouth at bedtime.     Current Facility-Administered Medications  Medication Dose Route Frequency Provider Last Rate Last Dose  . 0.9 %  sodium chloride infusion  500 mL Intravenous Continuous Irene Shipper, MD        Allergies:   Floxin [ofloxacin]   Social History:  The patient  reports that he has never smoked. He has never used smokeless tobacco. He reports that he does not drink alcohol or use drugs.   Family History:  The patient's family history includes Breast cancer in his paternal grandmother; Clotting disorder in his father; Heart attack in his father and paternal grandfather; Heart disease in his brother and father; Hypertension in his mother.    ROS:  Please see  the history of present illness.   Otherwise, review of systems is positive for None.   All other systems are reviewed and negative.   PHYSICAL EXAM: VS:  BP 110/80   Pulse 69   Ht 6\' 1"  (1.854 m)   Wt 233 lb 9.6 oz (106 kg)   BMI 30.82 kg/m  , BMI Body mass index is 30.82  kg/m. GEN: Well nourished, well developed, in no acute distress  HEENT: normal  Neck: no JVD, carotid bruits, or masses Cardiac: RRR; no murmurs, rubs, or gallops,no edema  Respiratory:  clear to auscultation bilaterally, normal work of breathing GI: soft, nontender, nondistended, + BS MS: no deformity or atrophy  Skin: warm and dry, device site well healed Neuro:  Strength and sensation are intact Psych: euthymic mood, full affect  EKG:  EKG is ordered today. Personal review of the ekg ordered shows SR, RBBB, PVCs  Personal review of the device interrogation today. Results in [place]: No results found for requested labs within last 8760 hours.    Lipid Panel     Component Value Date/Time   CHOL 137 03/06/2015 0550   TRIG 105 03/06/2015 0550   HDL 41 03/06/2015 0550   CHOLHDL 3.3 03/06/2015 0550   VLDL 21 03/06/2015 0550   LDLCALC 75 03/06/2015 0550     Wt Readings from Last 3 Encounters:  10/12/16 233 lb 9.6 oz (106 kg)  09/02/16 230 lb (104.3 kg)  07/21/16 229 lb (103.9 kg)      Other studies Reviewed: Additional studies/ records that were reviewed today include: TEE 04/04/15  Review of the above records today demonstrates:  - Left ventricle: Systolic function was normal. The estimated  ejection fraction was in the range of 50% to 55%. Wall motion was  normal; there were no regional wall motion abnormalities. - Aortic valve: No evidence of vegetation. There was trivial  regurgitation. - Mitral valve: No evidence of vegetation. There was mild  regurgitation. - Left atrium: The atrium was mildly dilated. No evidence of  thrombus in the atrial cavity or appendage. - Right  atrium: No evidence of thrombus in the atrial cavity or  appendage. - Atrial septum: There was a patent foramen ovale. - Tricuspid valve: No evidence of vegetation. - Pulmonic valve: No evidence of vegetation.  Holter 06/26/15 Average HR: 88 bpm Minimum HR: 56 bpm Maximum HR: 128 bpm  26% PVCs  ASSESSMENT AND PLAN:  1.  PVCs: Had ablation with intermittent suppression of PVCs. Is currently on flecainide which has somewhat reduced his burden. He is continuing to have episodic PVCs. Asymptomatic. No changes at this time.  2. Hypertension: Well-controlled today. No changes necessary.  Current medicines are reviewed at length with the patient today.   The patient does not have concerns regarding his medicines.  The following changes were made today:  None  Labs/ tests ordered today include:  No orders of the defined types were placed in this encounter.    Disposition:   FU with Anterio Scheel 12  months   Signed, Angelisa Winthrop Meredith Leeds, MD  10/12/2016 11:06 AM     [facility] [address] [place] [address] [phone] (office) [phone] (fax)

## 2016-10-12 ENCOUNTER — Ambulatory Visit (INDEPENDENT_AMBULATORY_CARE_PROVIDER_SITE_OTHER): Payer: Medicare HMO | Admitting: Cardiology

## 2016-10-12 ENCOUNTER — Encounter: Payer: Self-pay | Admitting: Cardiology

## 2016-10-12 VITALS — BP 110/80 | HR 69 | Ht 73.0 in | Wt 233.6 lb

## 2016-10-12 DIAGNOSIS — I493 Ventricular premature depolarization: Secondary | ICD-10-CM

## 2016-10-12 DIAGNOSIS — I10 Essential (primary) hypertension: Secondary | ICD-10-CM

## 2016-10-12 LAB — CUP PACEART INCLINIC DEVICE CHECK
Date Time Interrogation Session: 20181001141611
Implantable Pulse Generator Implant Date: 20170323

## 2016-10-12 NOTE — Patient Instructions (Addendum)
Medication Instructions:  Your physician recommends that you continue on your current medications as directed. Please refer to the Current Medication list given to you today.  * If you need a refill on your cardiac medications before your next appointment, please call your pharmacy.   Labwork: None ordered  Testing/Procedures: None ordered  Follow-Up: Your physician wants you to follow-up in: 1 year with Dr. Camnitz.  You will receive a reminder letter in the mail two months in advance. If you don't receive a letter, please call our office to schedule the follow-up appointment.  Thank you for choosing [facility]!!   Lorane Cousar, RN [phone]        

## 2016-10-14 ENCOUNTER — Ambulatory Visit: Payer: Medicare HMO | Admitting: Neurology

## 2016-10-19 DIAGNOSIS — M65342 Trigger finger, left ring finger: Secondary | ICD-10-CM | POA: Diagnosis not present

## 2016-10-19 DIAGNOSIS — M180 Bilateral primary osteoarthritis of first carpometacarpal joints: Secondary | ICD-10-CM | POA: Diagnosis not present

## 2016-10-20 DIAGNOSIS — H5010 Unspecified exotropia: Secondary | ICD-10-CM | POA: Diagnosis not present

## 2016-10-26 ENCOUNTER — Ambulatory Visit (INDEPENDENT_AMBULATORY_CARE_PROVIDER_SITE_OTHER): Payer: Medicare HMO | Admitting: *Deleted

## 2016-10-26 DIAGNOSIS — I639 Cerebral infarction, unspecified: Secondary | ICD-10-CM | POA: Diagnosis not present

## 2016-10-26 NOTE — Progress Notes (Signed)
Subjective:   Joshua [name] was seen in consultation in the movement disorder clinic at the request of Marton Redwood, MD.  The evaluation is for tremor.  Pt currently seeing Dr. Jaynee Eagles and presents for second opinion to see if perhaps has PD.  Has seen Dr. Jacelyn Grip in the past. Patient is a 71 year old male with a history of vertigo, embolic stroke in February, 2017 and ophthalmic migraine who presents today for evaluation of his essential tremor.  Tremor started when he was in his 17s, but has been getting slowly but progressively worse with the course of time.  In the past at the onset, tremor was in the R hand only and it would shake with fine motor coordination and with stress.  States that rest tremor on the R seemed to start about 3 months after the embolic infarct.    Tremor is most noticeable when at rest now.  Infarct involved tiny acute infarcts in the L motor strip and L post central gyrus (was watershed territory).    Now has loop recorder.  TEE with PFO.   Carotid u/s normal.  LDL 75 at time of event.  There is a family hx of tremor in his identical twin brother.    Tremor:   Affected by caffeine:  No. (2 cups coffee per day) Affected by alcohol:  Doesn't drink alcohol Affected by stress:  No. Affected by fatigue:  Yes.   Spills soup if on spoon:  Yes.   Spills glass of liquid if full:  No. But carries with 2 hands   Pt tells me about 4 different passing out episodes.  The first was many years ago after a back surgery and he dropped something and went to pick it up and passed out and it was attributed to going off of percocet.  The second epiosde occurred the Saturday following his stroke in 02/2015.  He was standing up in utility room and without warning he passed out and he woke up right when he hit the floor.  The third episode was about 3 weeks ago and he walked into the bedroom and got his back brace and passed out.  He woke up right when he hit the floor.  Dr. Raul Del records indicate  that he felt that his syncope was likely related to orthostasis in the setting of Flomax, his beta blocker and his pain medications.  He did recommend that the patient follow-up with cardiology and consider alternatives to the beta blocker and Flomax.   Current/Previously tried tremor medications: primidone - unable to tolerate above 50 mg bid; propranolol 20 mg bid; metoprolol 25 mg daily  Voice: weaker; raspy Sleep: sleeping well with [place]  Vivid Dreams:  No.  Acting out dreams:  Yes.   Wet Pillows: No. Postural symptoms:  Yes.    Falls?  Yes.  , 2 falls (one missed a step; one tripped over a landscape timber) Bradykinesia symptoms: shuffling gait (patient attributes to nerve damage from previous back surgery) Loss of smell:  No. Loss of taste:  No. Urinary Incontinence:  No. Difficulty Swallowing:  No. Handwriting, micrographia: Yes.   Trouble with ADL's:  Yes.   (just slower than in the past  Trouble buttoning clothing: Yes.   Depression:  Yes.   (more frustration; placed on cymbalta for pain and associated irritabiltiy) Memory changes:  Yes.   (minor issues - trouble with names) Hallucinations:  No.  visual distortions: No. N/V:  No. Diplopia:  No. Dyskinesia:  No.  09/02/16 update:  Patient seen today in follow-up for tremor.  This patient is accompanied in the office by his spouse who supplements the history.  He had a DaT scan on 08/27/2016.  I reviewed this.  There was loss of signal in the left and right putamen.  His opthalmology appointment is 09/17/16.  He is seeing Dr. Valetta Close he had 2 near falls.  Both of them he felt that he was going to fall to the left and if he was not standing in a store where he ended up catching himself on a shelf, he feels that he would have fallen in one instance.  He really did not feel near syncopal in this instance..    10/27/16 update: Patient seen today in follow-up.  He is a accompanied in the office by his spouse who supplements the history.   He saw his cardiologist since our last visit.  His LINQ monitor has not shown any significant bradycardia or cause for syncope and he was not sure that his propranolol was contributing.  No further passing out but few near syncope and what he describes as "head rushes."  Not common though.   Labs were reviewed since last visit.  Antigliadin IgA was elevated, but all other labs were unremarkable.  He saw Dr. Valetta Close since our last visit.  I have notes but they are handwritten and difficult to read.  Told that notes from office are not dictated.  He was seen by Dr. Valetta Close on 09/17/16 and 10/20/16.  It appears that he has convergence insufficiency and was told that this was from Parkinson's disease.  He tried a prism and it made diplopia worse.    Riding recumbent bike.   Limited somewhat by back pain.   Allergies  Allergen Reactions  . Floxin [Ofloxacin] Other (See Comments)    Lowers BP    Outpatient Encounter Prescriptions as of 10/27/2016  Medication Sig  . albuterol (PROVENTIL HFA;VENTOLIN HFA) 108 (90 BASE) MCG/ACT inhaler Inhale 2 puffs into the lungs every 4 (four) hours as needed for wheezing.   . Cholecalciferol (VITAMIN D) 2000 units CAPS Take 2,000 Units by mouth daily.  . clopidogrel (PLAVIX) 75 MG tablet Take 1 tablet (75 mg total) by mouth daily.  [name] Calcium (STOOL SOFTENER PO) Take 1 capsule by mouth 2 (two) times daily.   . DULoxetine (CYMBALTA) 60 MG capsule Take by mouth daily.   [name] DYMISTA 137-50 MCG/ACT SUSP Place 1 puff into both nostrils at bedtime.   . finasteride (PROSCAR) 5 MG tablet Take 5 mg by mouth Daily.  . fish oil-omega-3 fatty acids 1000 MG capsule Take 1 g by mouth.   . flecainide (TAMBOCOR) 150 MG tablet TAKE 1/2 TABLET BY MOUTH EVERY 12 HOURS  . hydrocortisone (ANUSOL-HC) 2.5 % rectal cream Place 1 application rectally 2 (two) times daily.  . lansoprazole (PREVACID) 30 MG capsule TAKE ONE CAPSULE BY MOUTH TWICE A DAY  . levocetirizine (XYZAL) 5 MG tablet Take  5 mg by mouth every evening.    . montelukast (SINGULAIR) 10 MG tablet Take 10 mg by mouth at bedtime.    . primidone (MYSOLINE) 50 MG tablet Take 25 mg by mouth 2 (two) times daily.  . propranolol (INDERAL) 20 MG tablet Take 20 mg by mouth 2 (two) times daily.  . rosuvastatin (CRESTOR) 10 MG tablet Take 5 mg by mouth daily. Take 1/2 tablet daily  . tamsulosin (FLOMAX) 0.4 MG CAPS capsule Take 0.4 mg by mouth daily after  supper.   . zolpidem (AMBIEN) 10 MG tablet Take 10 mg by mouth at bedtime.  . [DISCONTINUED] oxyCODONE-acetaminophen (PERCOCET/ROXICET) 5-325 MG tablet Take 1 tablet by mouth every 6 (six) hours as needed.  . carbidopa-levodopa (SINEMET IR) 25-100 MG tablet Take 1 tablet by mouth 3 (three) times daily.   Facility-Administered Encounter Medications as of 10/27/2016  Medication  . 0.9 %  sodium chloride infusion    Past Medical History:  Diagnosis Date  . Allergic rhinitis   . Allergy   . Anal fissure   . Anxiety    from chronic pain from surgery- on Cymbalta  . Arthritis   . Asthma   . Barrett's esophagus 03/29/2014  . Carotid artery disease ([place])    Carotid Doppler normal August, 2007  . Cataract   . Chest pain    Coronaries normal 1996 /  nuclear..06/2002..normal...EF  56% /  stress echo.. May, 2011.... no  scar or ischemia... rate related RBBB  . Diverticulosis   . Dyslipidemia   . Ejection fraction    EF 60%, stress echo, May, 2011  . GERD (gastroesophageal reflux disease)   . Headache   . History of loop recorder    has since 04/04/15  . HTN (hypertension)    takes Metoprolol for PVC control  . Hx of colonic polyps    adenomatous  . Hx of colonoscopy   . Hyperlipidemia   . IFG (impaired fasting glucose)   . Palpitations    Benign PVCs  . RBBB (right bundle branch block)    rate related  . Shingles   . Sleep apnea    pt denies  . Stroke [facility])    TIA  . TIA (transient ischemic attack) 02/2015   Per pt, had 2 strokes  . Tremor    Hand tremor    . Vertigo     Past Surgical History:  Procedure Laterality Date  . BACK SURGERY  2002,2009   x 6  . CHOLECYSTECTOMY  11/30/2012   with IOC  . COLONOSCOPY    . ELECTROPHYSIOLOGIC STUDY N/A 09/26/2015   Procedure: V Tach Ablation (PVC);  Surgeon: Will Meredith Leeds, MD;  Location: [place] [facility];  Service: Cardiovascular;  Laterality: N/A;  . EP IMPLANTABLE DEVICE N/A 04/04/2015   Procedure: Loop Recorder Insertion;  Surgeon: Will Meredith Leeds, MD;  Location: [place] [facility];  Service: Cardiovascular;  Laterality: N/A;  . HERNIA REPAIR     laprascopic  . KNEE SURGERY     DECEMBER 2017,LEFT KNEE SCOPED  . NECK SURGERY  2002  . POLYPECTOMY    . ROTATOR CUFF REPAIR Left   . TEE WITHOUT CARDIOVERSION N/A 04/04/2015   Procedure: TRANSESOPHAGEAL ECHOCARDIOGRAM (TEE);  Surgeon: Lelon Perla, MD;  Location: [facility]COPY;  Service: Cardiovascular;  Laterality: N/A;  . UPPER GASTROINTESTINAL ENDOSCOPY    . V Tach ablation  09/26/2015    Social History   Social History  . Marital status: Married    Spouse name: [name]   . Number of children: 2  . Years of education: 15   Occupational History  . Retired     Dance movement psychotherapist   Social History Main Topics  . Smoking status: Never Smoker  . Smokeless tobacco: Never Used  . Alcohol use No  . Drug use: No  . Sexual activity: Not on file   Other Topics Concern  . Not on file   Social History Narrative   Lives with wife.  Caffeine use: Coffee/tea/soda- ocass   Right-handed    Family Status  Relation Status  . Father Deceased at age 24  . Brother Alive       twin brother  . Mother Deceased  . PGM Deceased  . PGF (Not Specified)  . Brother Alive  . Daughter Alive  . Daughter Alive  . Neg Hx (Not Specified)    Review of Systems Chronic LBP   A complete 10 system ROS was obtained and was negative apart from what is mentioned.   Objective:   VITALS:   Vitals:   10/27/16 1449  SpO2: 92%  Weight: 229 lb  (103.9 kg)  Height: 6\' 1"  (1.854 m)   Orthostatic VS for the past 24 hrs (Last 3 readings):  BP- Lying Pulse- Lying BP- Sitting Pulse- Sitting BP- Standing at 0 minutes Pulse- Standing at 0 minutes  10/27/16 1450 98/62 68 118/82 66 138/88 70     Gen:  Appears stated age and in NAD. HEENT:  Normocephalic, atraumatic. The mucous membranes are moist. The superficial temporal arteries are without ropiness or tenderness. Cardiovascular: Bradycardic.  Regular.   Lungs: Clear to auscultation bilaterally. Neck: There are no carotid bruits noted bilaterally.  NEUROLOGICAL:  Orientation:  The patient is alert and oriented x 3.   Cranial nerves: There is good facial symmetry. There is facial hypomimia.   Pt has esotropia on the R and can elicit diplopia with far L gaze.   visual fields are full to confrontational testing. Speech is fluent and clear.  The patient is able to make the gutteral sounds without difficulty.   Soft palate rises symmetrically and there is no tongue deviation. Hearing is intact to conversational tone. Tone: slight increase in tone with activation on the right Sensation: Sensation is intact to light touch throughout.   Coordination:  The patient has no dysmetria.  Has slight decremation with alternation of supination/pronation on the right and finger taps on the right .  All other RAMs are normal Motor: Strength is 5/5 in the bilateral upper and lower extremities.  Shoulder shrug is equal bilaterally.  There is no pronator drift.  There are no fasciculations noted. Gait and Station: The patient has mild trouble arising without the use of the hands.  When he ambulates he has a re-emergent tremor on the right.  There is decrease arm swing on the right with ambulation  MOVEMENT EXAM: Tremor:  There is a rest tremor on the right only today.  He has chin tremor when distracted.  LABS:  Received lab work from primary care physician dated 06/10/2016.  Sodium was 139, potassium 4.9,  chloride 105, CO2 26, BUN 12, creatinine 0.8.  White blood cells 5.1, hemoglobin 14.8, hematocrit 44.0, platelets 215.  AST 17, ALT 16, alkaline phosphatase 93.  TSH was 2.18.  Hemoglobin A1c was 5.4.  Lab Results  Component Value Date   VITAMINB12 493 09/02/2016      Assessment/Plan:   1.  Suspect early PSP  - ET/PD is in the ddx but I would not expect to see atypical features in these patients.  Time will certainly allow [place] to differentiate these.  -DaT scan positive for loss of DA in bilateral putamen in 08/2016.  I showed the patient and his wife the films and reiterated that this was not a diagnostic test.  I talked to him about my suspicions for atypical state.  The patient and his wife asked several questions about atypical states today and what  to expect in the future.  I answered those to the best of my ability.  -The patient and his wife really would like to try some medicine and see if it is helpful.  I have no objection, but really will need to watch his blood pressure closely.  We will try carpet/levodopa 25/100 in slowly work up to one tablet 3 times per day.  Risks, benefits, side effects and alternative therapies were discussed.  The opportunity to ask questions was given and they were answered to the best of my ability.  The patient expressed understanding and willingness to follow the outlined treatment protocols.  2.  Cerebral infarct (watershed territory) 02/2015  - Now has loop recorder.  TEE with PFO.  No indication to close given age and RoPE score of 5.   Carotid u/s normal.  LDL 75 at time of event. On plavix.  He asked me about changing to ASA but he had stroke on this so would recommend staying with the Plavix.    3.  Syncope  -as above, multiple episodes.  May be due to meds (propranolol, flomax).  Cardiology did not think that his propranolol was contributing.  His LINQ monitor has not shown any reasons for syncope.  However, this certainly would not show  orthostasis.  4.  Follow up is anticipated in the next few months, sooner should new neurologic issues arise.  Much greater than 50% of this visit was spent in counseling and coordinating care.  Total face to face time:  30 min    CC:  Marton Redwood, MD

## 2016-10-26 NOTE — Progress Notes (Signed)
Carelink Summary Report / Loop Recorder 

## 2016-10-27 ENCOUNTER — Encounter: Payer: Self-pay | Admitting: Neurology

## 2016-10-27 ENCOUNTER — Ambulatory Visit (INDEPENDENT_AMBULATORY_CARE_PROVIDER_SITE_OTHER): Payer: Medicare HMO | Admitting: Neurology

## 2016-10-27 VITALS — Ht 73.0 in | Wt 229.0 lb

## 2016-10-27 DIAGNOSIS — G20 Parkinson's disease: Secondary | ICD-10-CM

## 2016-10-27 DIAGNOSIS — G903 Multi-system degeneration of the autonomic nervous system: Secondary | ICD-10-CM

## 2016-10-27 MED ORDER — CARBIDOPA-LEVODOPA 25-100 MG PO TABS: 1.0000 | ORAL_TABLET | Freq: Three times a day (TID) | ORAL | 1 refills | Status: DC

## 2016-10-27 NOTE — Patient Instructions (Signed)
1.  Start carbidopa/levodopa 25/100, 1/2 tab three times a day 30 minutes before meals x 1 wk, then 1/2 in am & noon & 1 in evening for a week, then 1/2 in am &1 at noon &one in evening for a week, then 1 tablet three times a day 30 minutes before meals thereafter

## 2016-10-28 LAB — CUP PACEART REMOTE DEVICE CHECK
Date Time Interrogation Session: 20181014220949
Implantable Pulse Generator Implant Date: 20170323

## 2016-11-04 DIAGNOSIS — Z23 Encounter for immunization: Secondary | ICD-10-CM | POA: Diagnosis not present

## 2016-11-22 ENCOUNTER — Encounter (HOSPITAL_COMMUNITY): Payer: Self-pay | Admitting: Emergency Medicine

## 2016-11-22 ENCOUNTER — Ambulatory Visit (HOSPITAL_COMMUNITY)
Admission: EM | Admit: 2016-11-22 | Discharge: 2016-11-22 | Disposition: A | Discharge status: Home / Self Care | Payer: Medicare HMO | Attending: Family Medicine | Admitting: Family Medicine

## 2016-11-22 DIAGNOSIS — T7840XA Allergy, unspecified, initial encounter: Secondary | ICD-10-CM

## 2016-11-22 MED ORDER — PREDNISONE 20 MG PO TABS: ORAL_TABLET | ORAL | 0 refills | Status: DC

## 2016-11-22 NOTE — ED Provider Notes (Signed)
Joshua [name]   [id number] 11/22/16 Arrival Time: 1258   SUBJECTIVE:  Joshua [name] is a 71 y.o. male who presents to the urgent care with complaint of rash to legs that is itchy x 1 week  The rash began on posterior aspects of his legs and is now progressing to involve the lower abdomen. It is intensely pruritic.  Patient has had his shingles shot.   He is due for his second shigerix vaccine.  Patient was started on Sinemet October 16 and is gradually been increasing the dose since. This means that the rash began approximately 2 weeks after initiating Sinemet.    Past Medical History:  Diagnosis Date  . Allergic rhinitis   . Allergy   . Anal fissure   . Anxiety    from chronic pain from surgery- on Cymbalta  . Arthritis   . Asthma   . Barrett's esophagus 03/29/2014  . Carotid artery disease ([place])    Carotid Doppler normal August, 2007  . Cataract   . Chest pain    Coronaries normal 1996 /  nuclear..06/2002..normal...EF  56% /  stress echo.. May, 2011.... no  scar or ischemia... rate related RBBB  . Diverticulosis   . Dyslipidemia   . Ejection fraction    EF 60%, stress echo, May, 2011  . GERD (gastroesophageal reflux disease)   . Headache   . History of loop recorder    has since 04/04/15  . HTN (hypertension)    takes Metoprolol for PVC control  . Hx of colonic polyps    adenomatous  . Hx of colonoscopy   . Hyperlipidemia   . IFG (impaired fasting glucose)   . Palpitations    Benign PVCs  . RBBB (right bundle branch block)    rate related  . Shingles   . Sleep apnea    pt denies  . Stroke [facility])    TIA  . TIA (transient ischemic attack) 02/2015   Per pt, had 2 strokes  . Tremor    Hand tremor  . Vertigo    Family History  Problem Relation Age of Onset  . Heart attack Father   . Heart disease Father   . Clotting disorder Father   . Heart disease Brother   . Hypertension Mother   . Breast cancer Paternal Grandmother   . Heart attack  Paternal Grandfather   . Colon cancer Neg Hx   . Esophageal cancer Neg Hx   . Stomach cancer Neg Hx   . Rectal cancer Neg Hx   . Colon polyps Neg Hx    Social History   Socioeconomic History  . Marital status: Married    Spouse name: [name]   . Number of children: 2  . Years of education: 4  . Highest education level: Not on file  Social Needs  . Financial resource strain: Not on file  . Food insecurity - worry: Not on file  . Food insecurity - inability: Not on file  . Transportation needs - medical: Not on file  . Transportation needs - non-medical: Not on file  Occupational History  . Occupation: Retired    Comment: Dance movement psychotherapist  Tobacco Use  . Smoking status: Never Smoker  . Smokeless tobacco: Never Used  Substance and Sexual Activity  . Alcohol use: No    Alcohol/week: 0.0 oz  . Drug use: No  . Sexual activity: Not on file  Other Topics Concern  . Not on file  Social History Narrative  Lives with wife.    Caffeine use: Coffee/tea/soda- ocass   Right-handed   Current Facility-Administered Medications for the 11/22/16 encounter [facility] Encounter)  Medication  . 0.9 %  sodium chloride infusion   No outpatient medications have been marked as taking for the 11/22/16 encounter [facility] Encounter).   Allergies  Allergen Reactions  . Floxin [Ofloxacin] Other (See Comments)    Lowers BP      ROS: As per HPI, remainder of ROS negative.   OBJECTIVE:   Vitals:   11/22/16 1338  BP: 101/77  Pulse: (!) 53  Resp: 18  Temp: 98.3 F (36.8 C)  TempSrc: Oral  SpO2: 100%     General appearance: alert; no distress Eyes: PERRL; ; conjunctiva normal HENT: normocephalic; atraumatic; canal normal, external ears normal without trauma; nasal mucosa normal; oral mucosa normal Neck: supple Lungs: clear to auscultation bilaterally Heart: regular rate and rhythm Abdomen: soft, non-tender; linear rash in left inguinal crease and 1 cm slightly elevated erythematous  circular erythema right flank Back: no CVA tenderness Extremities: no cyanosis or edema; symmetrical erythematous rash on the backs of his calves and thighs which are papular and becoming confluent. Skin: warm and dry, rash as described Neurologic: normal gait; grossly normal Psychological: alert and cooperative; normal mood and affect; mild bilateral pill-rolling tremor      Labs:  Results for orders placed or performed in visit on 10/26/16  CUP PACEART REMOTE DEVICE CHECK  Result Value Ref Range   Date Time Interrogation Session [phone]    Pulse Generator Manufacturer MERM    Pulse Gen Model KDT26 Reveal LINQ    Pulse Gen Serial Number [id number]    Clinic Name [facility]    Implantable Pulse Generator Type ICM/ILR    Implantable Pulse Generator Implant Date [id number]    Eval Rhythm SR w/PVCs     Labs Reviewed - No data to display  No results found.     ASSESSMENT & PLAN:  1. Allergic reaction, initial encounter     Meds ordered this encounter  Medications  . predniSONE (DELTASONE) 20 MG tablet    Sig: Two daily with food    Dispense:  10 tablet    Refill:  0  stop the Sinemet  Reviewed expectations re: course of current medical issues. Questions answered. Outlined signs and symptoms indicating need for more acute intervention. Patient verbalized understanding. After Visit Summary given.      Robyn Haber, MD 11/22/16 1430

## 2016-11-22 NOTE — Discharge Instructions (Signed)
The timing of the rash and the location strongly suggest an allergic reaction to the Sinemet. Please stop this and contact her doctor tomorrow when the office opens. Explain that we feel that the rash is a direct consequence to the Sinemet and that we advised stopping it.  Prednisone should stop some of the itching. Try to keep his skin cool to reduce the itching nature of it.

## 2016-11-22 NOTE — ED Triage Notes (Signed)
Pt sts rash to legs that is itchy x 1 week

## 2016-11-23 ENCOUNTER — Telehealth: Payer: Self-pay | Admitting: Neurology

## 2016-11-23 DIAGNOSIS — L239 Allergic contact dermatitis, unspecified cause: Secondary | ICD-10-CM | POA: Diagnosis not present

## 2016-11-23 DIAGNOSIS — L821 Other seborrheic keratosis: Secondary | ICD-10-CM | POA: Diagnosis not present

## 2016-11-23 MED ORDER — SINEMET 25-100 MG PO TABS: 1.0000 | ORAL_TABLET | Freq: Three times a day (TID) | ORAL | 2 refills | Status: DC

## 2016-11-23 NOTE — Telephone Encounter (Signed)
I would highly doubt that this is a reaction to levodopa.  I've never had a patient allergic to levodopa.  I guess I could conceptually see someone allergic to the filler in it but not dopamine itself.  Lets try DAW sinemet first.  It could be expensive though.  I would also like to refer him to dermatology, Dr. Renda Rolls

## 2016-11-23 NOTE — Telephone Encounter (Signed)
Patient made aware. He does have a dermatologist already and called Dr. Juanna Cao office for evaluation. They will see him over the next couple days. DAW Sinemet sent to pharmacy.

## 2016-11-23 NOTE — Telephone Encounter (Signed)
Pt called and said he thinks he is having a reaction to his medication, broke out on his legs and went to urgent care yesterday, please call and advise

## 2016-11-23 NOTE — Telephone Encounter (Signed)
See urgent care note in Epic. Please advise.

## 2016-11-24 ENCOUNTER — Ambulatory Visit (INDEPENDENT_AMBULATORY_CARE_PROVIDER_SITE_OTHER): Payer: Medicare HMO | Admitting: *Deleted

## 2016-11-24 DIAGNOSIS — I639 Cerebral infarction, unspecified: Secondary | ICD-10-CM

## 2016-11-25 NOTE — Progress Notes (Signed)
Carelink Summary Report / Loop Recorder 

## 2016-12-09 ENCOUNTER — Telehealth: Payer: Self-pay | Admitting: Neurology

## 2016-12-09 MED ORDER — PRAMIPEXOLE DIHYDROCHLORIDE 0.125 MG PO TABS: ORAL_TABLET | ORAL | 0 refills | Status: DC

## 2016-12-09 MED ORDER — PRAMIPEXOLE DIHYDROCHLORIDE 0.5 MG PO TABS: 0.5000 mg | ORAL_TABLET | Freq: Three times a day (TID) | ORAL | 2 refills | Status: DC

## 2016-12-09 NOTE — Telephone Encounter (Signed)
Spoke with patient and he states his tremors don't seem better at all from Levodopa. He thought maybe this was somewhat effective, but states that if he tries to perform any tasks his tremor is worse. He doesn't notice tremor getting better or worse depending on the time of day or when he takes his medication.  He did stay on Carbidopa Levodopa 25/100 IR (not Sinemet as allergy was determined to be something else). He is on one tablet three times daily. He asked about increasing. Last note says you were going to be cautious with dosing due to blood pressure.  Patient states blood pressure has been okay. No dizziness.  Please advise.

## 2016-12-09 NOTE — Telephone Encounter (Signed)
Patient states that the Carbidopa levodopa is not working on the tremors wants to know what he should do please call

## 2016-12-09 NOTE — Telephone Encounter (Signed)
I usually try not to "chase" tremor as I often give more side effects than provide benefit.  Before I go up on the carbidopa/levodopa 25/100, have him try it with pramipexole and work to 0.5 mg tid with my titration as that medication may be a little more effective for tremor.  Watch BP.  Let me know if confusion, hallucinations, lightheadedness, dizziness, etc

## 2016-12-09 NOTE — Telephone Encounter (Signed)
Patient made aware. He agrees with this plan. Aware of side effects. He will contact us with any problems. RX sent to pharmacy.

## 2016-12-11 LAB — CUP PACEART REMOTE DEVICE CHECK
Date Time Interrogation Session: 20181113223847
Implantable Pulse Generator Implant Date: 20170323

## 2016-12-14 DIAGNOSIS — E668 Other obesity: Secondary | ICD-10-CM | POA: Diagnosis not present

## 2016-12-14 DIAGNOSIS — I10 Essential (primary) hypertension: Secondary | ICD-10-CM | POA: Diagnosis not present

## 2016-12-14 DIAGNOSIS — Z6831 Body mass index (BMI) 31.0-31.9, adult: Secondary | ICD-10-CM | POA: Diagnosis not present

## 2016-12-14 DIAGNOSIS — G20 Parkinson's disease: Secondary | ICD-10-CM | POA: Diagnosis not present

## 2016-12-14 DIAGNOSIS — R7302 Impaired glucose tolerance (oral): Secondary | ICD-10-CM | POA: Diagnosis not present

## 2016-12-14 DIAGNOSIS — E7849 Other hyperlipidemia: Secondary | ICD-10-CM | POA: Diagnosis not present

## 2016-12-24 ENCOUNTER — Ambulatory Visit (INDEPENDENT_AMBULATORY_CARE_PROVIDER_SITE_OTHER): Payer: Medicare HMO | Admitting: *Deleted

## 2016-12-24 DIAGNOSIS — I639 Cerebral infarction, unspecified: Secondary | ICD-10-CM | POA: Diagnosis not present

## 2016-12-25 DIAGNOSIS — M4716 Other spondylosis with myelopathy, lumbar region: Secondary | ICD-10-CM | POA: Diagnosis not present

## 2016-12-25 NOTE — Progress Notes (Signed)
Carelink Summary Report / Loop Recorder 

## 2017-01-04 LAB — CUP PACEART REMOTE DEVICE CHECK
Date Time Interrogation Session: 20181213230911
Implantable Pulse Generator Implant Date: 20170323

## 2017-01-23 ENCOUNTER — Other Ambulatory Visit: Payer: Self-pay | Admitting: Internal Medicine

## 2017-01-25 ENCOUNTER — Ambulatory Visit (INDEPENDENT_AMBULATORY_CARE_PROVIDER_SITE_OTHER): Payer: Medicare HMO | Admitting: *Deleted

## 2017-01-25 DIAGNOSIS — I639 Cerebral infarction, unspecified: Secondary | ICD-10-CM | POA: Diagnosis not present

## 2017-01-26 NOTE — Progress Notes (Signed)
Carelink Summary Report / Loop Recorder 

## 2017-02-05 LAB — CUP PACEART REMOTE DEVICE CHECK
Date Time Interrogation Session: 20190112231121
Implantable Pulse Generator Implant Date: 20170323

## 2017-02-08 ENCOUNTER — Other Ambulatory Visit: Payer: Self-pay | Admitting: Cardiology

## 2017-02-08 MED ORDER — FLECAINIDE ACETATE 150 MG PO TABS: ORAL_TABLET | ORAL | 2 refills | Status: DC

## 2017-02-12 ENCOUNTER — Other Ambulatory Visit: Payer: Self-pay | Admitting: Internal Medicine

## 2017-02-22 ENCOUNTER — Ambulatory Visit (INDEPENDENT_AMBULATORY_CARE_PROVIDER_SITE_OTHER): Payer: Medicare HMO | Admitting: *Deleted

## 2017-02-22 DIAGNOSIS — I639 Cerebral infarction, unspecified: Secondary | ICD-10-CM

## 2017-02-23 NOTE — Progress Notes (Signed)
Carelink Summary Report / Loop Recorder 

## 2017-03-10 ENCOUNTER — Other Ambulatory Visit: Payer: Self-pay | Admitting: Neurology

## 2017-03-23 LAB — CUP PACEART REMOTE DEVICE CHECK
Date Time Interrogation Session: 20190212015854
Implantable Pulse Generator Implant Date: 20170323

## 2017-03-24 DIAGNOSIS — M4716 Other spondylosis with myelopathy, lumbar region: Secondary | ICD-10-CM | POA: Diagnosis not present

## 2017-03-25 ENCOUNTER — Telehealth: Payer: Self-pay | Admitting: Neurology

## 2017-03-25 NOTE — Telephone Encounter (Signed)
Patient is still having tremors and wants to talk to someone about the medication

## 2017-03-26 NOTE — Telephone Encounter (Signed)
LMOM letting patient know we need a follow up appt with him.   Last seen in October. New medication was added in November. If still having issues with tremors she will definitely need to examine him.   He is due for follow up and was instructed to call back and make one.

## 2017-03-29 ENCOUNTER — Ambulatory Visit (INDEPENDENT_AMBULATORY_CARE_PROVIDER_SITE_OTHER): Payer: Medicare HMO | Admitting: *Deleted

## 2017-03-29 DIAGNOSIS — I639 Cerebral infarction, unspecified: Secondary | ICD-10-CM | POA: Diagnosis not present

## 2017-03-29 NOTE — Progress Notes (Signed)
Carelink Summary Report / Loop Recorder 

## 2017-04-02 NOTE — Progress Notes (Signed)
Subjective:   Joshua [name] was seen in consultation in the movement disorder clinic at the request of Marton Redwood, MD.  The evaluation is for tremor.  Pt currently seeing Dr. Jaynee Eagles and presents for second opinion to see if perhaps has PD.  Has seen Dr. Jacelyn Grip in the past. Patient is a 72 year old male with a history of vertigo, embolic stroke in February, 2017 and ophthalmic migraine who presents today for evaluation of his essential tremor.  Tremor started when he was in his 17s, but has been getting slowly but progressively worse with the course of time.  In the past at the onset, tremor was in the R hand only and it would shake with fine motor coordination and with stress.  States that rest tremor on the R seemed to start about 3 months after the embolic infarct.    Tremor is most noticeable when at rest now.  Infarct involved tiny acute infarcts in the L motor strip and L post central gyrus (was watershed territory).    Now has loop recorder.  TEE with PFO.   Carotid u/s normal.  LDL 75 at time of event.  There is a family hx of tremor in his identical twin brother.    Tremor:   Affected by caffeine:  No. (2 cups coffee per day) Affected by alcohol:  Doesn't drink alcohol Affected by stress:  No. Affected by fatigue:  Yes.   Spills soup if on spoon:  Yes.   Spills glass of liquid if full:  No. But carries with 2 hands   Pt tells me about 4 different passing out episodes.  The first was many years ago after a back surgery and he dropped something and went to pick it up and passed out and it was attributed to going off of percocet.  The second epiosde occurred the Saturday following his stroke in 02/2015.  He was standing up in utility room and without warning he passed out and he woke up right when he hit the floor.  The third episode was about 3 weeks ago and he walked into the bedroom and got his back brace and passed out.  He woke up right when he hit the floor.  Dr. Raul Del records indicate  that he felt that his syncope was likely related to orthostasis in the setting of Flomax, his beta blocker and his pain medications.  He did recommend that the patient follow-up with cardiology and consider alternatives to the beta blocker and Flomax.   Current/Previously tried tremor medications: primidone - unable to tolerate above 50 mg bid; propranolol 20 mg bid; metoprolol 25 mg daily  Voice: weaker; raspy Sleep: sleeping well with [place]  Vivid Dreams:  No.  Acting out dreams:  Yes.   Wet Pillows: No. Postural symptoms:  Yes.    Falls?  Yes.  , 2 falls (one missed a step; one tripped over a landscape timber) Bradykinesia symptoms: shuffling gait (patient attributes to nerve damage from previous back surgery) Loss of smell:  No. Loss of taste:  No. Urinary Incontinence:  No. Difficulty Swallowing:  No. Handwriting, micrographia: Yes.   Trouble with ADL's:  Yes.   (just slower than in the past  Trouble buttoning clothing: Yes.   Depression:  Yes.   (more frustration; placed on cymbalta for pain and associated irritabiltiy) Memory changes:  Yes.   (minor issues - trouble with names) Hallucinations:  No.  visual distortions: No. N/V:  No. Diplopia:  No. Dyskinesia:  No.  09/02/16 update:  Patient seen today in follow-up for tremor.  This patient is accompanied in the office by his spouse who supplements the history.  He had a DaT scan on 08/27/2016.  I reviewed this.  There was loss of signal in the left and right putamen.  His opthalmology appointment is 09/17/16.  He is seeing Dr. Valetta Close he had 2 near falls.  Both of them he felt that he was going to fall to the left and if he was not standing in a store where he ended up catching himself on a shelf, he feels that he would have fallen in one instance.  He really did not feel near syncopal in this instance..    10/27/16 update: Patient seen today in follow-up.  He is a accompanied in the office by his spouse who supplements the history.   He saw his cardiologist since our last visit.  His LINQ monitor has not shown any significant bradycardia or cause for syncope and he was not sure that his propranolol was contributing.  No further passing out but few near syncope and what he describes as "head rushes."  Not common though.   Labs were reviewed since last visit.  Antigliadin IgA was elevated, but all other labs were unremarkable.  He saw Dr. Valetta Close since our last visit.  I have notes but they are handwritten and difficult to read.  Told that notes from office are not dictated.  He was seen by Dr. Valetta Close on 09/17/16 and 10/20/16.  It appears that he has convergence insufficiency and was told that this was from Parkinson's disease.  He tried a prism and it made diplopia worse.    Riding recumbent bike.   Limited somewhat by back pain.  04/05/17 update: The patient is seen today in follow-up.  He is accompanied by his wife who supplements the history.  Levodopa was started last visit and worked to carbidopa/levodopa 25/100, 1 tablet 3 times per day.  Patient called not long thereafter to state that he was having a reaction to levodopa and broke out in a rash on his legs.  It was doubtful this was from the levodopa, but he was going to follow-up with his dermatologist.  He was also changed to d.a.w. Sinemet.  Ultimately, they determined, as suspected, that the rash was not from levodopa and he was switched back to generic medication.  He called and at the end of November to complain about tremor.  He was started on low-dose pramipexole, 0.5 mg 3 times per day.  Reports that it works some for tremor when arm is down by the side but not when arm is held flexed.  He had one fall in the flower bed.  He pulled the flag pole out and lost his balance when he tried to reset it.  Hasn't been riding recumbent bike but he is using punching bag.     Allergies  Allergen Reactions  . Floxin [Ofloxacin] Other (See Comments)    Lowers BP    Outpatient Encounter  Medications as of 04/05/2017  Medication Sig  . albuterol (PROVENTIL HFA;VENTOLIN HFA) 108 (90 BASE) MCG/ACT inhaler Inhale 2 puffs into the lungs every 4 (four) hours as needed for wheezing.   . Cholecalciferol (VITAMIN D) 2000 units CAPS Take 2,000 Units by mouth daily.  . clopidogrel (PLAVIX) 75 MG tablet Take 1 tablet (75 mg total) by mouth daily.  [name] Calcium (STOOL SOFTENER PO) Take 1 capsule by mouth 2 (two) times daily.   [name]  DULoxetine (CYMBALTA) 60 MG capsule Take by mouth daily.   [name] DYMISTA 137-50 MCG/ACT SUSP Place 1 puff into both nostrils at bedtime.   . finasteride (PROSCAR) 5 MG tablet Take 5 mg by mouth Daily.  . fish oil-omega-3 fatty acids 1000 MG capsule Take 1 g by mouth.   . flecainide (TAMBOCOR) 150 MG tablet TAKE 1/2 TABLET BY MOUTH EVERY 12 HOURS  . lansoprazole (PREVACID) 30 MG capsule TAKE ONE CAPSULE BY MOUTH TWICE A DAY  . levocetirizine (XYZAL) 5 MG tablet Take 5 mg by mouth every evening.    . montelukast (SINGULAIR) 10 MG tablet Take 10 mg by mouth at bedtime.    . pramipexole (MIRAPEX) 0.5 MG tablet TAKE 1 TABLET 3 TIMES DAILY  . primidone (MYSOLINE) 50 MG tablet Take 50 mg by mouth 2 (two) times daily.   [name] PROCTOSOL HC 2.5 % rectal cream APPLY INTO AND AROUND RECTUM 2 TIMES A DAY  . propranolol (INDERAL) 20 MG tablet Take 20 mg by mouth 2 (two) times daily.  . rosuvastatin (CRESTOR) 10 MG tablet Take 5 mg by mouth daily. Take 1/2 tablet daily  . SINEMET 25-100 MG tablet Take 1 tablet 3 (three) times daily by mouth.  . tamsulosin (FLOMAX) 0.4 MG CAPS capsule Take 0.4 mg by mouth daily after supper.   . zolpidem (AMBIEN) 10 MG tablet Take 10 mg by mouth at bedtime.  . [DISCONTINUED] pramipexole (MIRAPEX) 0.125 MG tablet 1 tablet three times per day for a week, then 2 tablets three times per day for a week and then fill the 0.5 mg tablet  . [DISCONTINUED] predniSONE (DELTASONE) 20 MG tablet Two daily with food   Facility-Administered Encounter Medications  as of 04/05/2017  Medication  . 0.9 %  sodium chloride infusion    Past Medical History:  Diagnosis Date  . Allergic rhinitis   . Allergy   . Anal fissure   . Anxiety    from chronic pain from surgery- on Cymbalta  . Arthritis   . Asthma   . Barrett's esophagus 03/29/2014  . Carotid artery disease ([place])    Carotid Doppler normal August, 2007  . Cataract   . Chest pain    Coronaries normal 1996 /  nuclear..06/2002..normal...EF  56% /  stress echo.. May, 2011.... no  scar or ischemia... rate related RBBB  . Diverticulosis   . Dyslipidemia   . Ejection fraction    EF 60%, stress echo, May, 2011  . GERD (gastroesophageal reflux disease)   . Headache   . History of loop recorder    has since 04/04/15  . HTN (hypertension)    takes Metoprolol for PVC control  . Hx of colonic polyps    adenomatous  . Hx of colonoscopy   . Hyperlipidemia   . IFG (impaired fasting glucose)   . Palpitations    Benign PVCs  . RBBB (right bundle branch block)    rate related  . Shingles   . Sleep apnea    pt denies  . Stroke [facility])    TIA  . TIA (transient ischemic attack) 02/2015   Per pt, had 2 strokes  . Tremor    Hand tremor  . Vertigo     Past Surgical History:  Procedure Laterality Date  . BACK SURGERY  2002,2009   x 6  . CHOLECYSTECTOMY  11/30/2012   with IOC  . COLONOSCOPY    . ELECTROPHYSIOLOGIC STUDY N/A 09/26/2015   Procedure: V Tach Ablation (PVC);  Surgeon: Will Meredith Leeds, MD;  Location: [place] [facility];  Service: Cardiovascular;  Laterality: N/A;  . EP IMPLANTABLE DEVICE N/A 04/04/2015   Procedure: Loop Recorder Insertion;  Surgeon: Will Meredith Leeds, MD;  Location: [place] [facility];  Service: Cardiovascular;  Laterality: N/A;  . HERNIA REPAIR     laprascopic  . KNEE SURGERY     DECEMBER 2017,LEFT KNEE SCOPED  . NECK SURGERY  2002  . POLYPECTOMY    . ROTATOR CUFF REPAIR Left   . TEE WITHOUT CARDIOVERSION N/A 04/04/2015   Procedure: TRANSESOPHAGEAL  ECHOCARDIOGRAM (TEE);  Surgeon: Lelon Perla, MD;  Location: [facility] ENDOSCOPY;  Service: Cardiovascular;  Laterality: N/A;  . UPPER GASTROINTESTINAL ENDOSCOPY    . V Tach ablation  09/26/2015    Social History   Socioeconomic History  . Marital status: Married    Spouse name: [name]   . Number of children: 2  . Years of education: 23  . Highest education level: Not on file  Occupational History  . Occupation: Retired    CommentPresenter, broadcasting  Social Needs  . Financial resource strain: Not on file  . Food insecurity:    Worry: Not on file    Inability: Not on file  . Transportation needs:    Medical: Not on file    Non-medical: Not on file  Tobacco Use  . Smoking status: Never Smoker  . Smokeless tobacco: Never Used  Substance and Sexual Activity  . Alcohol use: No    Alcohol/week: 0.0 oz  . Drug use: No  . Sexual activity: Not on file  Lifestyle  . Physical activity:    Days per week: Not on file    Minutes per session: Not on file  . Stress: Not on file  Relationships  . Social connections:    Talks on phone: Not on file    Gets together: Not on file    Attends religious service: Not on file    Active member of club or organization: Not on file    Attends meetings of clubs or organizations: Not on file    Relationship status: Not on file  . Intimate partner violence:    Fear of current or ex partner: Not on file    Emotionally abused: Not on file    Physically abused: Not on file    Forced sexual activity: Not on file  Other Topics Concern  . Not on file  Social History Narrative   Lives with wife.    Caffeine use: Coffee/tea/soda- ocass   Right-handed    Family Status  Relation Name Status  . Father  Deceased at age 85  . Brother  Alive       twin brother  . Mother  Deceased  . PGM  Deceased  . PGF  (Not Specified)  . Brother  Alive  . Daughter  Alive  . Daughter  Alive  . Neg Hx  (Not Specified)    Review of Systems Chronic LBP  Which  continues to limit exercise.  A complete 10 system ROS was obtained and was negative apart from what is mentioned.   Objective:   VITALS:   Vitals:   04/05/17 0852  BP: (!) 142/80  Pulse: 74  SpO2: 94%  Weight: 229 lb (103.9 kg)  Height: 6\' 1"  (1.854 m)   No data found.   Gen:  Appears stated age and in NAD. HEENT:  Normocephalic, atraumatic. The mucous membranes are moist. The superficial temporal  arteries are without ropiness or tenderness. Cardiovascular: Bradycardic.  Regular.   Lungs: Clear to auscultation bilaterally. Neck: There are no carotid bruits noted bilaterally.  NEUROLOGICAL:  Orientation:  The patient is alert and oriented x 3.   Cranial nerves: There is good facial symmetry. There is facial hypomimia.   Pt has esotropia on the R and can elicit diplopia with far L gaze.   visual fields are full to confrontational testing. Speech is fluent and clear.  The patient is able to make the gutteral sounds without difficulty.   Soft palate rises symmetrically and there is no tongue deviation. Hearing is intact to conversational tone. Tone: slight increase in tone with activation on the right Sensation: Sensation is intact to light touch throughout.   Coordination:  The patient has no dysmetria.  Has slight decremation with alternation of supination/pronation on the right and finger taps on the right .  All other RAMs are normal Motor: Strength is 5/5 in the bilateral upper and lower extremities.  Shoulder shrug is equal bilaterally.  There is no pronator drift.  There are no fasciculations noted. Gait and Station: The patient has mild trouble arising without the use of the hands.  When he ambulates he has a re-emergent tremor on the right.  There is decrease arm swing on the right with ambulation  MOVEMENT EXAM: Tremor:  There is a rest tremor on the right only today.  He has chin tremor when distracted.  LABS:  Received lab work from primary care physician dated  06/10/2016.  Sodium was 139, potassium 4.9, chloride 105, CO2 26, BUN 12, creatinine 0.8.  White blood cells 5.1, hemoglobin 14.8, hematocrit 44.0, platelets 215.  AST 17, ALT 16, alkaline phosphatase 93.  TSH was 2.18.  Hemoglobin A1c was 5.4.  Lab Results  Component Value Date   VITAMINB12 493 09/02/2016      Assessment/Plan:   1.  Suspect early PSP  - ET/PD is in the ddx but I would not expect to see atypical features in these patients.  Time will certainly allow [place] to differentiate these.  Discussed this again today and told him he could have traditional PD but he meets some exclusion criteria.  -DaT scan positive for loss of DA in bilateral putamen in 08/2016.  I showed the patient and his wife the films and reiterated that this was not a diagnostic test.    -continue carbidopa/levodopa 25/100 three times per day  -talked about increasing pramipexole to see if we could get better control on tremor.  I would say that it may not help tremor and may give more SE but he would like to try increasing it.  Will increase to 1.0/1.0/0.5 mg  -set alarm on phone to remember to take medication.  -they would like to wean primidone.  Will wean this first before increasing pramipexole.    -information given on ACT gym.  2.  Cerebral infarct (watershed territory) 02/2015  - Now has loop recorder.  TEE with PFO.  No indication to close given age and RoPE score of 5.   Carotid u/s normal.  LDL 75 at time of event. On plavix.  He asked me about changing to ASA but he had stroke on this so would recommend staying with the Plavix.    3.  Syncope  -as above, multiple episodes.  May be due to meds (propranolol, flomax).  Cardiology did not think that his propranolol was contributing.  His LINQ monitor has not shown any reasons  for syncope.  However, this certainly would not show orthostasis.  4. Follow up is anticipated in the next few months, sooner should new neurologic issues arise.  Much greater than 50% of  this visit was spent in counseling and coordinating care.  Total face to face time:  25 min    CC:  Marton Redwood, MD

## 2017-04-05 ENCOUNTER — Encounter: Payer: Self-pay | Admitting: Neurology

## 2017-04-05 ENCOUNTER — Ambulatory Visit (INDEPENDENT_AMBULATORY_CARE_PROVIDER_SITE_OTHER): Payer: Medicare HMO | Admitting: Neurology

## 2017-04-05 VITALS — BP 142/80 | HR 74 | Ht 73.0 in | Wt 229.0 lb

## 2017-04-05 DIAGNOSIS — G20 Parkinson's disease: Secondary | ICD-10-CM

## 2017-04-05 DIAGNOSIS — R251 Tremor, unspecified: Secondary | ICD-10-CM | POA: Diagnosis not present

## 2017-04-05 NOTE — Patient Instructions (Signed)
1. Decrease Primidone to one tablet daily for one week, then stop.   2. In two weeks, increase Pramipexole to 2 in the morning, 1 in the afternoon, 1 in the evening for one week -  Then take 2 tablets in the morning, 2 in the afternoon, 1 in the evening.

## 2017-04-06 ENCOUNTER — Other Ambulatory Visit: Payer: Self-pay | Admitting: Neurology

## 2017-04-27 DIAGNOSIS — Z85828 Personal history of other malignant neoplasm of skin: Secondary | ICD-10-CM | POA: Diagnosis not present

## 2017-04-27 DIAGNOSIS — D225 Melanocytic nevi of trunk: Secondary | ICD-10-CM | POA: Diagnosis not present

## 2017-04-27 DIAGNOSIS — L814 Other melanin hyperpigmentation: Secondary | ICD-10-CM | POA: Diagnosis not present

## 2017-04-27 DIAGNOSIS — L820 Inflamed seborrheic keratosis: Secondary | ICD-10-CM | POA: Diagnosis not present

## 2017-04-29 ENCOUNTER — Ambulatory Visit (INDEPENDENT_AMBULATORY_CARE_PROVIDER_SITE_OTHER): Payer: Medicare HMO | Admitting: *Deleted

## 2017-04-29 ENCOUNTER — Other Ambulatory Visit: Payer: Self-pay | Admitting: Neurology

## 2017-04-29 DIAGNOSIS — I639 Cerebral infarction, unspecified: Secondary | ICD-10-CM | POA: Diagnosis not present

## 2017-04-30 NOTE — Progress Notes (Signed)
Carelink Summary Report / Loop Recorder 

## 2017-05-04 LAB — CUP PACEART REMOTE DEVICE CHECK
Date Time Interrogation Session: 20190317000704
Implantable Pulse Generator Implant Date: 20170323

## 2017-05-05 ENCOUNTER — Telehealth: Payer: Self-pay | Admitting: Neurology

## 2017-05-05 NOTE — Telephone Encounter (Signed)
Spoke with patient. He states two weeks ago increased Pramipexole. He started with 2 tablets in the AM, 1 in the afternoon, and 1 in the evening. He started developing symptoms. This week he is on 2/2/1.   He states when he stands occasionally he becomes lightheaded, dizzy, and short of breath. He states episodes last about a minute. He has had 4 episodes this week.   He does have a history of PVC's. No symptoms before increasing Pramipexole. Everytime this has happened he has been outside of the home and unable to check blood pressure. He does have a history of blood pressure fluctuation. He is drinking 6-8 glasses of water a day.   He states no better control of tremor with increase in Pramipexole. I advised him to go back to 1 tablet TID until I hear back from Dr. Carles Collet tomorrow.    Dr. Carles Collet - please advise.

## 2017-05-05 NOTE — Telephone Encounter (Signed)
Patient called regarding his medication Pramipexole. He said that he feels that his Blood pressure is dropping and he has had shortness of breath and lightheaded. He said this started once he increased up to 2 pills. He also said there was some confusion at the pharmacy as well. Please Call. Thanks

## 2017-05-06 NOTE — Telephone Encounter (Signed)
I doubt that is from the pramipexole but okay to stay at 1 tid for now

## 2017-05-06 NOTE — Telephone Encounter (Signed)
Patient made aware. Also advised to contact Cardiology to let them know what's going on.

## 2017-05-12 ENCOUNTER — Telehealth: Payer: Self-pay | Admitting: Cardiology

## 2017-05-12 NOTE — Telephone Encounter (Signed)
Spoke w/ pt and requested that he send a manual transmission b/c his home monitor has not updated in at least 14 days.   

## 2017-05-20 ENCOUNTER — Other Ambulatory Visit: Payer: Self-pay | Admitting: Internal Medicine

## 2017-05-24 ENCOUNTER — Other Ambulatory Visit: Payer: Self-pay

## 2017-05-24 MED ORDER — LANSOPRAZOLE 30 MG PO CPDR: 30.0000 mg | DELAYED_RELEASE_CAPSULE | Freq: Two times a day (BID) | ORAL | 3 refills | Status: DC

## 2017-05-28 LAB — CUP PACEART REMOTE DEVICE CHECK
Date Time Interrogation Session: 20190419003336
Implantable Pulse Generator Implant Date: 20170323

## 2017-06-01 ENCOUNTER — Ambulatory Visit (INDEPENDENT_AMBULATORY_CARE_PROVIDER_SITE_OTHER): Payer: Medicare HMO | Admitting: *Deleted

## 2017-06-01 ENCOUNTER — Telehealth: Payer: Self-pay | Admitting: Internal Medicine

## 2017-06-01 DIAGNOSIS — I639 Cerebral infarction, unspecified: Secondary | ICD-10-CM

## 2017-06-01 NOTE — Telephone Encounter (Signed)
Patient states his insurance company will not cover medication prevacid for two pills a day, but pt states he can not use protonix bc it does not help him. Patient wanting to know how he could get prevacid approved by ins.

## 2017-06-01 NOTE — Progress Notes (Signed)
Carelink Summary Report / Loop Recorder 

## 2017-06-02 NOTE — Telephone Encounter (Signed)
Sent prior authorization to Cover My Meds.  Spoke with patient and told him I would keep him posted on the progress

## 2017-06-02 NOTE — Telephone Encounter (Signed)
Pt calling to follow up on this message. He wants to know if something can be done so his insurance can cover prevacid two pills a day. He would like a call back.

## 2017-06-10 ENCOUNTER — Telehealth: Payer: Self-pay | Admitting: Internal Medicine

## 2017-06-10 ENCOUNTER — Telehealth: Payer: Self-pay

## 2017-06-10 NOTE — Telephone Encounter (Signed)
Hi Magda Paganini, this pt called to let you know that his insurance uses [employer] for mail order medication. Thank you

## 2017-06-11 DIAGNOSIS — M180 Bilateral primary osteoarthritis of first carpometacarpal joints: Secondary | ICD-10-CM | POA: Diagnosis not present

## 2017-06-11 NOTE — Telephone Encounter (Signed)
Resubmitted prior authorization for BID prevacid through [facility].  Told patient I would call him when I heard back from them.  Patient agreed.

## 2017-06-14 NOTE — Telephone Encounter (Signed)
Left message for patient that twice a day Prevacid was approved through [employer]

## 2017-06-15 ENCOUNTER — Other Ambulatory Visit: Payer: Self-pay

## 2017-06-15 MED ORDER — LANSOPRAZOLE 30 MG PO CPDR: 30.0000 mg | DELAYED_RELEASE_CAPSULE | Freq: Two times a day (BID) | ORAL | 6 refills | Status: DC

## 2017-06-15 NOTE — Telephone Encounter (Signed)
Patient says that pharmacy is still telling him that this is not covered. [facility] # [phone]

## 2017-06-16 NOTE — Telephone Encounter (Signed)
Quantity limit exception approved for Lansoprazole.  Spoke to pharmacy who ran it successfully.  She told me patient was due to come in later and she would fill it and give it to him then.

## 2017-06-16 NOTE — Telephone Encounter (Signed)
[employer] approved Prevacid for quantity exception but NOT Lansoprazole.  Prevacid would be $150.   Resubmitted quantity limit request as Lansoprazole.  Lm on vm for patient regarding all this.

## 2017-06-23 DIAGNOSIS — I10 Essential (primary) hypertension: Secondary | ICD-10-CM | POA: Diagnosis not present

## 2017-06-23 DIAGNOSIS — R82998 Other abnormal findings in urine: Secondary | ICD-10-CM | POA: Diagnosis not present

## 2017-06-23 DIAGNOSIS — Z125 Encounter for screening for malignant neoplasm of prostate: Secondary | ICD-10-CM | POA: Diagnosis not present

## 2017-06-23 DIAGNOSIS — R7302 Impaired glucose tolerance (oral): Secondary | ICD-10-CM | POA: Diagnosis not present

## 2017-06-23 DIAGNOSIS — E7849 Other hyperlipidemia: Secondary | ICD-10-CM | POA: Diagnosis not present

## 2017-06-28 LAB — CUP PACEART REMOTE DEVICE CHECK
Date Time Interrogation Session: 20190522013850
Implantable Pulse Generator Implant Date: 20170323

## 2017-06-30 DIAGNOSIS — Z0000 Encounter for general adult medical examination without abnormal findings: Secondary | ICD-10-CM | POA: Diagnosis not present

## 2017-06-30 DIAGNOSIS — K581 Irritable bowel syndrome with constipation: Secondary | ICD-10-CM | POA: Diagnosis not present

## 2017-06-30 DIAGNOSIS — R7301 Impaired fasting glucose: Secondary | ICD-10-CM | POA: Diagnosis not present

## 2017-06-30 DIAGNOSIS — I10 Essential (primary) hypertension: Secondary | ICD-10-CM | POA: Diagnosis not present

## 2017-06-30 DIAGNOSIS — I493 Ventricular premature depolarization: Secondary | ICD-10-CM | POA: Diagnosis not present

## 2017-06-30 DIAGNOSIS — I69954 Hemiplegia and hemiparesis following unspecified cerebrovascular disease affecting left non-dominant side: Secondary | ICD-10-CM | POA: Diagnosis not present

## 2017-06-30 DIAGNOSIS — G20 Parkinson's disease: Secondary | ICD-10-CM | POA: Diagnosis not present

## 2017-06-30 DIAGNOSIS — R42 Dizziness and giddiness: Secondary | ICD-10-CM | POA: Diagnosis not present

## 2017-06-30 DIAGNOSIS — J45998 Other asthma: Secondary | ICD-10-CM | POA: Diagnosis not present

## 2017-06-30 DIAGNOSIS — E7849 Other hyperlipidemia: Secondary | ICD-10-CM | POA: Diagnosis not present

## 2017-07-05 ENCOUNTER — Ambulatory Visit (INDEPENDENT_AMBULATORY_CARE_PROVIDER_SITE_OTHER): Payer: Medicare HMO | Admitting: *Deleted

## 2017-07-05 DIAGNOSIS — I639 Cerebral infarction, unspecified: Secondary | ICD-10-CM

## 2017-07-05 NOTE — Progress Notes (Signed)
Carelink Summary Report / Loop Recorder 

## 2017-07-06 LAB — CUP PACEART REMOTE DEVICE CHECK
Date Time Interrogation Session: 20190624020949
Implantable Pulse Generator Implant Date: 20170323

## 2017-07-19 NOTE — Progress Notes (Signed)
Subjective:   Joshua [name] was seen in consultation in the movement disorder clinic at the request of Marton Redwood, MD.  The evaluation is for tremor.  Pt currently seeing Dr. Jaynee Eagles and presents for second opinion to see if perhaps has PD.  Has seen Dr. Jacelyn Grip in the past. Patient is a 72 year old male with a history of vertigo, embolic stroke in February, 2017 and ophthalmic migraine who presents today for evaluation of his essential tremor.  Tremor started when he was in his 17s, but has been getting slowly but progressively worse with the course of time.  In the past at the onset, tremor was in the R hand only and it would shake with fine motor coordination and with stress.  States that rest tremor on the R seemed to start about 3 months after the embolic infarct.    Tremor is most noticeable when at rest now.  Infarct involved tiny acute infarcts in the L motor strip and L post central gyrus (was watershed territory).    Now has loop recorder.  TEE with PFO.   Carotid u/s normal.  LDL 75 at time of event.  There is a family hx of tremor in his identical twin brother.    Tremor:   Affected by caffeine:  No. (2 cups coffee per day) Affected by alcohol:  Doesn't drink alcohol Affected by stress:  No. Affected by fatigue:  Yes.   Spills soup if on spoon:  Yes.   Spills glass of liquid if full:  No. But carries with 2 hands   Pt tells me about 4 different passing out episodes.  The first was many years ago after a back surgery and he dropped something and went to pick it up and passed out and it was attributed to going off of percocet.  The second epiosde occurred the Saturday following his stroke in 02/2015.  He was standing up in utility room and without warning he passed out and he woke up right when he hit the floor.  The third episode was about 3 weeks ago and he walked into the bedroom and got his back brace and passed out.  He woke up right when he hit the floor.  Dr. Raul Del records indicate  that he felt that his syncope was likely related to orthostasis in the setting of Flomax, his beta blocker and his pain medications.  He did recommend that the patient follow-up with cardiology and consider alternatives to the beta blocker and Flomax.   Current/Previously tried tremor medications: primidone - unable to tolerate above 50 mg bid; propranolol 20 mg bid; metoprolol 25 mg daily  Voice: weaker; raspy Sleep: sleeping well with [place]  Vivid Dreams:  No.  Acting out dreams:  Yes.   Wet Pillows: No. Postural symptoms:  Yes.    Falls?  Yes.  , 2 falls (one missed a step; one tripped over a landscape timber) Bradykinesia symptoms: shuffling gait (patient attributes to nerve damage from previous back surgery) Loss of smell:  No. Loss of taste:  No. Urinary Incontinence:  No. Difficulty Swallowing:  No. Handwriting, micrographia: Yes.   Trouble with ADL's:  Yes.   (just slower than in the past  Trouble buttoning clothing: Yes.   Depression:  Yes.   (more frustration; placed on cymbalta for pain and associated irritabiltiy) Memory changes:  Yes.   (minor issues - trouble with names) Hallucinations:  No.  visual distortions: No. N/V:  No. Diplopia:  No. Dyskinesia:  No.  09/02/16 update:  Patient seen today in follow-up for tremor.  This patient is accompanied in the office by his spouse who supplements the history.  He had a DaT scan on 08/27/2016.  I reviewed this.  There was loss of signal in the left and right putamen.  His opthalmology appointment is 09/17/16.  He is seeing Dr. Valetta Close he had 2 near falls.  Both of them he felt that he was going to fall to the left and if he was not standing in a store where he ended up catching himself on a shelf, he feels that he would have fallen in one instance.  He really did not feel near syncopal in this instance..    10/27/16 update: Patient seen today in follow-up.  He is a accompanied in the office by his spouse who supplements the history.   He saw his cardiologist since our last visit.  His LINQ monitor has not shown any significant bradycardia or cause for syncope and he was not sure that his propranolol was contributing.  No further passing out but few near syncope and what he describes as "head rushes."  Not common though.   Labs were reviewed since last visit.  Antigliadin IgA was elevated, but all other labs were unremarkable.  He saw Dr. Valetta Close since our last visit.  I have notes but they are handwritten and difficult to read.  Told that notes from office are not dictated.  He was seen by Dr. Valetta Close on 09/17/16 and 10/20/16.  It appears that he has convergence insufficiency and was told that this was from Parkinson's disease.  He tried a prism and it made diplopia worse.    Riding recumbent bike.   Limited somewhat by back pain.  04/05/17 update: The patient is seen today in follow-up.  He is accompanied by his wife who supplements the history.  Levodopa was started last visit and worked to carbidopa/levodopa 25/100, 1 tablet 3 times per day.  Patient called not long thereafter to state that he was having a reaction to levodopa and broke out in a rash on his legs.  It was doubtful this was from the levodopa, but he was going to follow-up with his dermatologist.  He was also changed to d.a.w. Sinemet.  Ultimately, they determined, as suspected, that the rash was not from levodopa and he was switched back to generic medication.  He called and at the end of November to complain about tremor.  He was started on low-dose pramipexole, 0.5 mg 3 times per day.  Reports that it works some for tremor when arm is down by the side but not when arm is held flexed.  He had one fall in the flower bed.  He pulled the flag pole out and lost his balance when he tried to reset it.  Hasn't been riding recumbent bike but he is using punching bag.    07/20/17 update: Patient is seen today in follow-up for parkinsonism.  He is on carbidopa/levodopa 25/100, 1 tablet 3  times per day.  He was weaned off primidone.  His pramipexole was increased last visit so he was taking 1 mg in the morning, 1 mg in the afternoon and 0.5 mg in the evening. He states that he tried this but it caused dizziness and he d/c that.   No compulsive behaviors.  No sleep attacks.  No hallucinations.  Had fall in bedroom.  Tripped over exercise mat that put on floor and didn't pick it up.  Also fell coming down steps.  Sold house and temporarily living in daughters basement.  Coming down stairs and railing doesn't fully go down the steps and tripped at last step and fell.     He has had no further syncopal episodes.  Records have been reviewed since our last visit.  He has seen Dr. Fredna Dow for primary osteoarthritis of the thumbs and had injections.  Allergies  Allergen Reactions  . Floxin [Ofloxacin] Other (See Comments)    Lowers BP    Outpatient Encounter Medications as of 07/20/2017  Medication Sig  . albuterol (PROVENTIL HFA;VENTOLIN HFA) 108 (90 BASE) MCG/ACT inhaler Inhale 2 puffs into the lungs every 4 (four) hours as needed for wheezing.   . carbidopa-levodopa (SINEMET IR) 25-100 MG tablet Take 1 tablet by mouth 3 (three) times daily.  . clopidogrel (PLAVIX) 75 MG tablet Take 1 tablet (75 mg total) by mouth daily.  . DULoxetine (CYMBALTA) 60 MG capsule Take by mouth daily.   [name] DYMISTA 137-50 MCG/ACT SUSP Place 1 puff into both nostrils at bedtime.   . finasteride (PROSCAR) 5 MG tablet Take 5 mg by mouth Daily.  . flecainide (TAMBOCOR) 150 MG tablet TAKE 1/2 TABLET BY MOUTH EVERY 12 HOURS  . lansoprazole (PREVACID) 30 MG capsule Take 1 capsule (30 mg total) by mouth 2 (two) times daily.  [name] levocetirizine (XYZAL) 5 MG tablet Take 5 mg by mouth every evening.    . montelukast (SINGULAIR) 10 MG tablet Take 10 mg by mouth at bedtime.    . pramipexole (MIRAPEX) 0.5 MG tablet Take 1 tablet (0.5 mg total) by mouth 3 (three) times daily.  [name] PROCTOSOL HC 2.5 % rectal cream APPLY INTO AND  AROUND RECTUM 2 TIMES A DAY  . rosuvastatin (CRESTOR) 10 MG tablet Take 5 mg by mouth daily. Take 1/2 tablet daily  . tamsulosin (FLOMAX) 0.4 MG CAPS capsule Take 0.4 mg by mouth daily after supper.   . zolpidem (AMBIEN) 10 MG tablet Take 10 mg by mouth at bedtime.  . [DISCONTINUED] carbidopa-levodopa (SINEMET IR) 25-100 MG tablet TAKE 1 TABLET BY MOUTH THREE TIMES A DAY  . [DISCONTINUED] pramipexole (MIRAPEX) 0.5 MG tablet 2 tablets in the morning, 2 tablets in the afternoon, 1 in the evening (Patient taking differently: Take 0.5 mg by mouth 3 (three) times daily. )  . [DISCONTINUED] Cholecalciferol (VITAMIN D) 2000 units CAPS Take 2,000 Units by mouth daily.  . [DISCONTINUED] Docusate Calcium (STOOL SOFTENER PO) Take 1 capsule by mouth 2 (two) times daily.   . [DISCONTINUED] fish oil-omega-3 fatty acids 1000 MG capsule Take 1 g by mouth.   . [DISCONTINUED] primidone (MYSOLINE) 50 MG tablet Take 50 mg by mouth 2 (two) times daily.   . [DISCONTINUED] propranolol (INDERAL) 20 MG tablet Take 20 mg by mouth 2 (two) times daily.  . [DISCONTINUED] SINEMET 25-100 MG tablet Take 1 tablet 3 (three) times daily by mouth.  . [DISCONTINUED] 0.9 %  sodium chloride infusion    No facility-administered encounter medications on file as of 07/20/2017.     Past Medical History:  Diagnosis Date  . Allergic rhinitis   . Allergy   . Anal fissure   . Anxiety    from chronic pain from surgery- on Cymbalta  . Arthritis   . Asthma   . Barrett's esophagus 03/29/2014  . Carotid artery disease ([place])    Carotid Doppler normal August, 2007  . Cataract   . Chest pain    Coronaries normal 1996 /  nuclear..06/2002..normal...EF  56% /  stress echo.. May, 2011.... no  scar or ischemia... rate related RBBB  . Diverticulosis   . Dyslipidemia   . Ejection fraction    EF 60%, stress echo, May, 2011  . GERD (gastroesophageal reflux disease)   . Headache   . History of loop recorder    has since 04/04/15  . HTN  (hypertension)    takes Metoprolol for PVC control  . Hx of colonic polyps    adenomatous  . Hx of colonoscopy   . Hyperlipidemia   . IFG (impaired fasting glucose)   . Palpitations    Benign PVCs  . RBBB (right bundle branch block)    rate related  . Shingles   . Sleep apnea    pt denies  . Stroke [facility])    TIA  . TIA (transient ischemic attack) 02/2015   Per pt, had 2 strokes  . Tremor    Hand tremor  . Vertigo     Past Surgical History:  Procedure Laterality Date  . BACK SURGERY  2002,2009   x 6  . CHOLECYSTECTOMY  11/30/2012   with IOC  . COLONOSCOPY    . ELECTROPHYSIOLOGIC STUDY N/A 09/26/2015   Procedure: V Tach Ablation (PVC);  Surgeon: Will Meredith Leeds, MD;  Location: [place] [facility];  Service: Cardiovascular;  Laterality: N/A;  . EP IMPLANTABLE DEVICE N/A 04/04/2015   Procedure: Loop Recorder Insertion;  Surgeon: Will Meredith Leeds, MD;  Location: [place] [facility];  Service: Cardiovascular;  Laterality: N/A;  . HERNIA REPAIR     laprascopic  . KNEE SURGERY     DECEMBER 2017,LEFT KNEE SCOPED  . NECK SURGERY  2002  . POLYPECTOMY    . ROTATOR CUFF REPAIR Left   . TEE WITHOUT CARDIOVERSION N/A 04/04/2015   Procedure: TRANSESOPHAGEAL ECHOCARDIOGRAM (TEE);  Surgeon: Lelon Perla, MD;  Location: [facility] ENDOSCOPY;  Service: Cardiovascular;  Laterality: N/A;  . UPPER GASTROINTESTINAL ENDOSCOPY    . V Tach ablation  09/26/2015    Social History   Socioeconomic History  . Marital status: Married    Spouse name: [name]   . Number of children: 2  . Years of education: 86  . Highest education level: Not on file  Occupational History  . Occupation: Retired    CommentPresenter, broadcasting  Social Needs  . Financial resource strain: Not on file  . Food insecurity:    Worry: Not on file    Inability: Not on file  . Transportation needs:    Medical: Not on file    Non-medical: Not on file  Tobacco Use  . Smoking status: Never Smoker  . Smokeless tobacco: Never  Used  Substance and Sexual Activity  . Alcohol use: No    Alcohol/week: 0.0 oz  . Drug use: No  . Sexual activity: Not on file  Lifestyle  . Physical activity:    Days per week: Not on file    Minutes per session: Not on file  . Stress: Not on file  Relationships  . Social connections:    Talks on phone: Not on file    Gets together: Not on file    Attends religious service: Not on file    Active member of club or organization: Not on file    Attends meetings of clubs or organizations: Not on file    Relationship status: Not on file  . Intimate partner violence:    Fear of current or ex partner: Not  on file    Emotionally abused: Not on file    Physically abused: Not on file    Forced sexual activity: Not on file  Other Topics Concern  . Not on file  Social History Narrative   Lives with wife.    Caffeine use: Coffee/tea/soda- ocass   Right-handed    Family Status  Relation Name Status  . Father  Deceased at age 27  . Brother  Alive       twin brother  . Mother  Deceased  . PGM  Deceased  . PGF  (Not Specified)  . Brother  Alive  . Daughter  Alive  . Daughter  Alive  . Neg Hx  (Not Specified)    Review of Systems Chronic LBP  which continues to limit exercise.  A complete 10 system ROS was obtained and was negative apart from what is mentioned.   Objective:   VITALS:   Vitals:   07/20/17 1530  BP: 120/80  Pulse: 90  SpO2: 94%  Weight: 232 lb (105.2 kg)  Height: 6\' 1"  (1.854 m)   No data found.   Gen:  Appears stated age and in NAD. HEENT:  Normocephalic, atraumatic. The mucous membranes are moist. The superficial temporal arteries are without ropiness or tenderness. Cardiovascular: RRR Lungs: ctab Neck: no bruits  NEUROLOGICAL:  Orientation:  The patient is alert and oriented x 3.   Cranial nerves: There is good facial symmetry. There is facial hypomimia.   Pt has esotropia on the R.  No diplopia is elicited.  Extraocular muscles are otherwise  intact.  Upgaze and downgaze are normal.  Speech is fluent and clear.   Tone: slight increase in tone with activation on the right Sensation: Sensation is intact to light touch throughout.   Coordination:  The patient has no dysmetria.  Has slight decremation with alternation of supination/pronation on the right and finger taps on the right .  All other RAMs are normal Motor: Strength is 5/5 in the bilateral upper and lower extremities.  Shoulder shrug is equal bilaterally.  There is no pronator drift.  There are no fasciculations noted. Gait and Station: The patient has mild trouble arising without the use of the hands.  When he ambulates he has a re-emergent tremor on the right.  He is actually shuffling a bit today.  MOVEMENT EXAM: Tremor:  There is a rest tremor on the right only today.  He has chin tremor when distracted.  LABS:  Received lab work from primary care physician dated 06/10/2016.  Sodium was 139, potassium 4.9, chloride 105, CO2 26, BUN 12, creatinine 0.8.  White blood cells 5.1, hemoglobin 14.8, hematocrit 44.0, platelets 215.  AST 17, ALT 16, alkaline phosphatase 93.  TSH was 2.18.  Hemoglobin A1c was 5.4.  Lab Results  Component Value Date   VITAMINB12 493 09/02/2016      Assessment/Plan:   1.  Parkinsonism  - ET/PD is in the ddx.  I have suspected a possible atypical state, but have not really seen that progress.  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  I am going to go ahead and schedule a levodopa challenge test.  -DaT scan positive for loss of DA in bilateral putamen in 08/2016.  I showed the patient and his wife the films and reiterated that this was not a diagnostic test.   -continue carbidopa/levodopa 25/100 three times per day  -Continue pramipexole 0.5 mg 3 times per day.  He was not  able to tolerate higher dosages because of dizziness.  -He is exercising at the [facility] 2 days/week.  Congratulated him on that.  -Invited to the Parkinson's  symposium.  -Safety in the home discussed.  A few falls mostly related to the fact that they are living in a temporary situation and their daughter's home.  2.  Cerebral infarct (watershed territory) 02/2015  - Now has loop recorder.  TEE with PFO.  No indication to close given age and RoPE score of 5.   Carotid u/s normal.  LDL 75 at time of event. On plavix.  He asked me about changing to ASA but he had stroke on this so would recommend staying with the Plavix.    3.  Syncope  -as above, multiple episodes.  May be due to meds (propranolol, flomax).  Cardiology did not think that his propranolol was contributing.  His LINQ monitor has not shown any reasons for syncope.  However, this certainly would not show orthostasis.  4.  Much greater than 50% of this visit was spent in counseling and coordinating care.  Total face to face time:  25 min    CC:  Marton Redwood, MD

## 2017-07-20 ENCOUNTER — Encounter: Payer: Self-pay | Admitting: Neurology

## 2017-07-20 ENCOUNTER — Ambulatory Visit (INDEPENDENT_AMBULATORY_CARE_PROVIDER_SITE_OTHER): Payer: Medicare HMO | Admitting: Neurology

## 2017-07-20 VITALS — BP 120/80 | HR 90 | Ht 73.0 in | Wt 232.0 lb

## 2017-07-20 DIAGNOSIS — G20 Parkinson's disease: Secondary | ICD-10-CM

## 2017-07-20 MED ORDER — PRAMIPEXOLE DIHYDROCHLORIDE 0.5 MG PO TABS: 0.5000 mg | ORAL_TABLET | Freq: Three times a day (TID) | ORAL | 1 refills | Status: DC

## 2017-07-20 MED ORDER — CARBIDOPA-LEVODOPA 25-100 MG PO TABS: 1.0000 | ORAL_TABLET | Freq: Three times a day (TID) | ORAL | 1 refills | Status: DC

## 2017-07-20 NOTE — Patient Instructions (Addendum)
On/off test scheduled for 09/01/17. Stay off Pramipexole on 08/31/17 and 09/01/17. Stay off Carbidopa Levodopa after the afternoon dose on 08/31/17.  Registration is OPEN!    Third Annual Parkinson's Education Symposium   To register: [email or web address]      Search:  [employer] person attending individually Questions: [place], [place] or [email or web address] [email or web address]

## 2017-08-03 DIAGNOSIS — N402 Nodular prostate without lower urinary tract symptoms: Secondary | ICD-10-CM | POA: Diagnosis not present

## 2017-08-06 ENCOUNTER — Ambulatory Visit (INDEPENDENT_AMBULATORY_CARE_PROVIDER_SITE_OTHER): Payer: Medicare HMO | Admitting: *Deleted

## 2017-08-06 DIAGNOSIS — I639 Cerebral infarction, unspecified: Secondary | ICD-10-CM

## 2017-08-09 NOTE — Progress Notes (Signed)
Carelink Summary Report / Loop Recorder 

## 2017-08-25 DIAGNOSIS — R200 Anesthesia of skin: Secondary | ICD-10-CM | POA: Diagnosis not present

## 2017-08-25 DIAGNOSIS — M180 Bilateral primary osteoarthritis of first carpometacarpal joints: Secondary | ICD-10-CM | POA: Diagnosis not present

## 2017-08-25 DIAGNOSIS — M65342 Trigger finger, left ring finger: Secondary | ICD-10-CM | POA: Diagnosis not present

## 2017-08-27 NOTE — Progress Notes (Signed)
Subjective:   Delio Slates was seen in consultation in the movement disorder clinic at the request of Marton Redwood, MD.  The evaluation is for tremor.  Pt currently seeing Dr. Jaynee Eagles and presents for second opinion to see if perhaps has PD.  Has seen Dr. Jacelyn Grip in the past. Patient is a 72 year old male with a history of vertigo, embolic stroke in February, 2017 and ophthalmic migraine who presents today for evaluation of his essential tremor.  Tremor started when he was in his 40s, but has been getting slowly but progressively worse with the course of time.  In the past at the onset, tremor was in the R hand only and it would shake with fine motor coordination and with stress.  States that rest tremor on the R seemed to start about 3 months after the embolic infarct.    Tremor is most noticeable when at rest now.  Infarct involved tiny acute infarcts in the L motor strip and L post central gyrus (was watershed territory).    Now has loop recorder.  TEE with PFO.   Carotid u/s normal.  LDL 75 at time of event.  There is a family hx of tremor in his identical twin brother.    Tremor:   Affected by caffeine:  No. (2 cups coffee per day) Affected by alcohol:  Doesn't drink alcohol Affected by stress:  No. Affected by fatigue:  Yes.   Spills soup if on spoon:  Yes.   Spills glass of liquid if full:  No. But carries with 2 hands   Pt tells me about 4 different passing out episodes.  The first was many years ago after a back surgery and he dropped something and went to pick it up and passed out and it was attributed to going off of percocet.  The second epiosde occurred the Saturday following his stroke in 02/2015.  He was standing up in utility room and without warning he passed out and he woke up right when he hit the floor.  The third episode was about 3 weeks ago and he walked into the bedroom and got his back brace and passed out.  He woke up right when he hit the floor.  Dr. Raul Del records indicate  that he felt that his syncope was likely related to orthostasis in the setting of Flomax, his beta blocker and his pain medications.  He did recommend that the patient follow-up with cardiology and consider alternatives to the beta blocker and Flomax.   Current/Previously tried tremor medications: primidone - unable to tolerate above 50 mg bid; propranolol 20 mg bid; metoprolol 25 mg daily  Voice: weaker; raspy Sleep: sleeping well with [place]  Vivid Dreams:  No.  Acting out dreams:  Yes.   Wet Pillows: No. Postural symptoms:  Yes.    Falls?  Yes.  , 2 falls (one missed a step; one tripped over a landscape timber) Bradykinesia symptoms: shuffling gait (patient attributes to nerve damage from previous back surgery) Loss of smell:  No. Loss of taste:  No. Urinary Incontinence:  No. Difficulty Swallowing:  No. Handwriting, micrographia: Yes.   Trouble with ADL's:  Yes.   (just slower than in the past  Trouble buttoning clothing: Yes.   Depression:  Yes.   (more frustration; placed on cymbalta for pain and associated irritabiltiy) Memory changes:  Yes.   (minor issues - trouble with names) Hallucinations:  No.  visual distortions: No. N/V:  No. Diplopia:  No. Dyskinesia:  No.  09/02/16 update:  Patient seen today in follow-up for tremor.  This patient is accompanied in the office by his spouse who supplements the history.  He had a DaT scan on 08/27/2016.  I reviewed this.  There was loss of signal in the left and right putamen.  His opthalmology appointment is 09/17/16.  He is seeing Dr. Valetta Close he had 2 near falls.  Both of them he felt that he was going to fall to the left and if he was not standing in a store where he ended up catching himself on a shelf, he feels that he would have fallen in one instance.  He really did not feel near syncopal in this instance..    10/27/16 update: Patient seen today in follow-up.  He is a accompanied in the office by his spouse who supplements the history.   He saw his cardiologist since our last visit.  His LINQ monitor has not shown any significant bradycardia or cause for syncope and he was not sure that his propranolol was contributing.  No further passing out but few near syncope and what he describes as "head rushes."  Not common though.   Labs were reviewed since last visit.  Antigliadin IgA was elevated, but all other labs were unremarkable.  He saw Dr. Valetta Close since our last visit.  I have notes but they are handwritten and difficult to read.  Told that notes from office are not dictated.  He was seen by Dr. Valetta Close on 09/17/16 and 10/20/16.  It appears that he has convergence insufficiency and was told that this was from Parkinson's disease.  He tried a prism and it made diplopia worse.    Riding recumbent bike.   Limited somewhat by back pain.  04/05/17 update: The patient is seen today in follow-up.  He is accompanied by his wife who supplements the history.  Levodopa was started last visit and worked to carbidopa/levodopa 25/100, 1 tablet 3 times per day.  Patient called not long thereafter to state that he was having a reaction to levodopa and broke out in a rash on his legs.  It was doubtful this was from the levodopa, but he was going to follow-up with his dermatologist.  He was also changed to d.a.w. Sinemet.  Ultimately, they determined, as suspected, that the rash was not from levodopa and he was switched back to generic medication.  He called and at the end of November to complain about tremor.  He was started on low-dose pramipexole, 0.5 mg 3 times per day.  Reports that it works some for tremor when arm is down by the side but not when arm is held flexed.  He had one fall in the flower bed.  He pulled the flag pole out and lost his balance when he tried to reset it.  Hasn't been riding recumbent bike but he is using punching bag.    07/20/17 update: Patient is seen today in follow-up for parkinsonism.  He is on carbidopa/levodopa 25/100, 1 tablet 3  times per day.  He was weaned off primidone.  His pramipexole was increased last visit so he was taking 1 mg in the morning, 1 mg in the afternoon and 0.5 mg in the evening. He states that he tried this but it caused dizziness and he d/c that.   No compulsive behaviors.  No sleep attacks.  No hallucinations.  Had fall in bedroom.  Tripped over exercise mat that put on floor and didn't pick it up.  Also fell coming down steps.  Sold house and temporarily living in daughters basement.  Coming down stairs and railing doesn't fully go down the steps and tripped at last step and fell.     He has had no further syncopal episodes.  Records have been reviewed since our last visit.  He has seen Dr. Fredna Dow for primary osteoarthritis of the thumbs and had injections.  09/01/17 update: Patient is seen today for levodopa challenge.  He has been off carbidopa/levodopa for 24 hours and pramipexole since on 8/19.  He feels no different off of the medication.    Allergies  Allergen Reactions  . Floxin [Ofloxacin] Other (See Comments)    Lowers BP    Outpatient Encounter Medications as of 09/01/2017  Medication Sig  . albuterol (PROVENTIL HFA;VENTOLIN HFA) 108 (90 BASE) MCG/ACT inhaler Inhale 2 puffs into the lungs every 4 (four) hours as needed for wheezing.   . carbidopa-levodopa (SINEMET IR) 25-100 MG tablet Take 1 tablet by mouth 3 (three) times daily.  . clopidogrel (PLAVIX) 75 MG tablet Take 1 tablet (75 mg total) by mouth daily.  . DULoxetine (CYMBALTA) 60 MG capsule Take by mouth daily.   [name] DYMISTA 137-50 MCG/ACT SUSP Place 1 puff into both nostrils at bedtime.   . finasteride (PROSCAR) 5 MG tablet Take 5 mg by mouth Daily.  . flecainide (TAMBOCOR) 150 MG tablet TAKE 1/2 TABLET BY MOUTH EVERY 12 HOURS  . lansoprazole (PREVACID) 30 MG capsule Take 1 capsule (30 mg total) by mouth 2 (two) times daily.  [name] levocetirizine (XYZAL) 5 MG tablet Take 5 mg by mouth every evening.    . montelukast (SINGULAIR) 10 MG  tablet Take 10 mg by mouth at bedtime.    . pramipexole (MIRAPEX) 0.5 MG tablet Take 1 tablet (0.5 mg total) by mouth 3 (three) times daily.  [name] PROCTOSOL HC 2.5 % rectal cream APPLY INTO AND AROUND RECTUM 2 TIMES A DAY  . rosuvastatin (CRESTOR) 10 MG tablet Take 5 mg by mouth daily. Take 1/2 tablet daily  . tamsulosin (FLOMAX) 0.4 MG CAPS capsule Take 0.4 mg by mouth daily after supper.   . zolpidem (AMBIEN) 10 MG tablet Take 10 mg by mouth at bedtime.   No facility-administered encounter medications on file as of 09/01/2017.     Past Medical History:  Diagnosis Date  . Allergic rhinitis   . Allergy   . Anal fissure   . Anxiety    from chronic pain from surgery- on Cymbalta  . Arthritis   . Asthma   . Barrett's esophagus 03/29/2014  . Carotid artery disease ([place])    Carotid Doppler normal August, 2007  . Cataract   . Chest pain    Coronaries normal 1996 /  nuclear..06/2002..normal...EF  56% /  stress echo.. May, 2011.... no  scar or ischemia... rate related RBBB  . Diverticulosis   . Dyslipidemia   . Ejection fraction    EF 60%, stress echo, May, 2011  . GERD (gastroesophageal reflux disease)   . Headache   . History of loop recorder    has since 04/04/15  . HTN (hypertension)    takes Metoprolol for PVC control  . Hx of colonic polyps    adenomatous  . Hx of colonoscopy   . Hyperlipidemia   . IFG (impaired fasting glucose)   . Palpitations    Benign PVCs  . RBBB (right bundle branch block)    rate related  . Shingles   . Sleep  apnea    pt denies  . Stroke [facility])    TIA  . TIA (transient ischemic attack) 02/2015   Per pt, had 2 strokes  . Tremor    Hand tremor  . Vertigo     Past Surgical History:  Procedure Laterality Date  . BACK SURGERY  2002,2009   x 6  . CHOLECYSTECTOMY  11/30/2012   with IOC  . COLONOSCOPY    . ELECTROPHYSIOLOGIC STUDY N/A 09/26/2015   Procedure: V Tach Ablation (PVC);  Surgeon: Will Meredith Leeds, MD;  Location: [place] [facility];   Service: Cardiovascular;  Laterality: N/A;  . EP IMPLANTABLE DEVICE N/A 04/04/2015   Procedure: Loop Recorder Insertion;  Surgeon: Will Meredith Leeds, MD;  Location: [place] [facility];  Service: Cardiovascular;  Laterality: N/A;  . HERNIA REPAIR     laprascopic  . KNEE SURGERY     DECEMBER 2017,LEFT KNEE SCOPED  . NECK SURGERY  2002  . POLYPECTOMY    . ROTATOR CUFF REPAIR Left   . TEE WITHOUT CARDIOVERSION N/A 04/04/2015   Procedure: TRANSESOPHAGEAL ECHOCARDIOGRAM (TEE);  Surgeon: Lelon Perla, MD;  Location[facility];  Service: Cardiovascular;  Laterality: N/A;  . UPPER GASTROINTESTINAL ENDOSCOPY    . V Tach ablation  09/26/2015    Social History   Socioeconomic History  . Marital status: Married    Spouse name: [name]   . Number of children: 2  . Years of education: 53  . Highest education level: Not on file  Occupational History  . Occupation: Retired    CommentPresenter, broadcasting  Social Needs  . Financial resource strain: Not on file  . Food insecurity:    Worry: Not on file    Inability: Not on file  . Transportation needs:    Medical: Not on file    Non-medical: Not on file  Tobacco Use  . Smoking status: Never Smoker  . Smokeless tobacco: Never Used  Substance and Sexual Activity  . Alcohol use: No    Alcohol/week: 0.0 standard drinks  . Drug use: No  . Sexual activity: Not on file  Lifestyle  . Physical activity:    Days per week: Not on file    Minutes per session: Not on file  . Stress: Not on file  Relationships  . Social connections:    Talks on phone: Not on file    Gets together: Not on file    Attends religious service: Not on file    Active member of club or organization: Not on file    Attends meetings of clubs or organizations: Not on file    Relationship status: Not on file  . Intimate partner violence:    Fear of current or ex partner: Not on file    Emotionally abused: Not on file    Physically abused: Not on file    Forced sexual  activity: Not on file  Other Topics Concern  . Not on file  Social History Narrative   Lives with wife.    Caffeine use: Coffee/tea/soda- ocass   Right-handed    Family Status  Relation Name Status  . Father  Deceased at age 75  . Brother  Alive       twin brother  . Mother  Deceased  . PGM  Deceased  . PGF  (Not Specified)  . Brother  Alive  . Daughter  Alive  . Daughter  Alive  . Neg Hx  (Not Specified)  Review of Systems Review of Systems  Constitutional: Negative.   HENT: Negative.   Eyes: Negative.   Respiratory: Negative.   Cardiovascular: Negative.   Gastrointestinal: Negative.   Genitourinary: Negative.   Musculoskeletal: Positive for back pain.  Skin: Negative.   Neurological: Positive for tremors.  Endo/Heme/Allergies: Negative.   Psychiatric/Behavioral: Negative.       Objective:   VITALS:   Vitals:   09/01/17 0936  BP: (!) 154/86  Pulse: 96  SpO2: 93%  Weight: 234 lb (106.1 kg)  Height: 6\' 1"  (1.854 m)   No data found.   GEN:  The patient appears stated age and is in NAD. HEENT:  Normocephalic, atraumatic.  The mucous membranes are moist. The superficial temporal arteries are without ropiness or tenderness. CV:  RRR Lungs:  CTAB Neck/HEME:  There are no carotid bruits bilaterally.  Neurological examination:  Orientation: The patient is alert and oriented x3. Cranial nerves: There is good facial symmetry. The speech is fluent and clear. Soft palate rises symmetrically and there is no tongue deviation. Hearing is intact to conversational tone. Sensation: Sensation is intact to light touch throughout Motor: Strength is 5/5 in the bilateral upper and lower extremities.   Shoulder shrug is equal and symmetric.  There is no pronator drift.  Levodopa challenge done today.  UPDRS motor off score was 15.  Pt then given 300mg  of levodopa dissolved in ginger ale and waited 39minutes to re-examine him.  UPDRS motor on score was 17.  Details of  UPDRS motor score documented on separate neurophysiologic worksheet.  In brief, tone was normal before and after administration of levodopa.  There was facial hypomimia.  There was bilateral upper extremity resting tremor, right greater than left.  This did not change with levodopa administration.   LABS:  Received lab work from primary care physician dated 06/10/2016.  Sodium was 139, potassium 4.9, chloride 105, CO2 26, BUN 12, creatinine 0.8.  White blood cells 5.1, hemoglobin 14.8, hematocrit 44.0, platelets 215.  AST 17, ALT 16, alkaline phosphatase 93.  TSH was 2.18.  Hemoglobin A1c was 5.4.  Lab Results  Component Value Date   VITAMINB12 493 09/02/2016      Assessment/Plan:   1.  Parkinsonism  - ET/PD is in the ddx.  I have suspected a possible atypical state, but have not really seen that progress.  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  I am going to go ahead and schedule a levodopa challenge test.  -DaT scan positive for loss of DA in bilateral putamen in 08/2016.  I showed the patient and his wife the films and reiterated that this was not a diagnostic test.   -Levodopa challenge test done today, September 01, 2017, did not show efficacy to levodopa.  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  We will keep him off of dopaminergic medication for now.  He was agreeable.  -He is still exercising ACT.  Doing well with that.  Congratulated him.  2.  Cerebral infarct (watershed territory) 02/2015  - Now has loop recorder.  TEE with PFO.  No indication to close given age and RoPE score of 5.   Carotid u/s normal.  LDL 75 at time of event. On plavix.  He asked me about changing to ASA but he had stroke on this so would recommend staying with the Plavix.    3.  Syncope  -Multiple episodes in the past, but none recently.  Blood pressure is just a little bit higher today and he felt that pramipexole was previously helping it.  He reported  he was going to make a follow-up with his primary care for discussion about blood pressure.  However, I did discuss with him the concept of permissive hypertension given his history with orthostasis.  He is to keep a log at home with blood pressures to show to his primary care physician.  4.  I will see him back in the next 6 to 8 months, sooner should new neurologic issues arise.    CC:  Marton Redwood, MD

## 2017-09-01 ENCOUNTER — Encounter: Payer: Self-pay | Admitting: Neurology

## 2017-09-01 ENCOUNTER — Ambulatory Visit (INDEPENDENT_AMBULATORY_CARE_PROVIDER_SITE_OTHER): Payer: Medicare HMO | Admitting: Neurology

## 2017-09-01 VITALS — BP 154/86 | HR 96 | Ht 73.0 in | Wt 234.0 lb

## 2017-09-01 DIAGNOSIS — G20 Parkinson's disease: Secondary | ICD-10-CM | POA: Diagnosis not present

## 2017-09-01 DIAGNOSIS — M180 Bilateral primary osteoarthritis of first carpometacarpal joints: Secondary | ICD-10-CM | POA: Diagnosis not present

## 2017-09-01 DIAGNOSIS — M25541 Pain in joints of right hand: Secondary | ICD-10-CM | POA: Diagnosis not present

## 2017-09-01 MED ORDER — CARBIDOPA-LEVODOPA 25-100 MG PO TABS
3.0000 | ORAL_TABLET | Freq: Once | ORAL | Status: AC
  Administered 2017-09-01: 3 via ORAL

## 2017-09-06 DIAGNOSIS — K449 Diaphragmatic hernia without obstruction or gangrene: Secondary | ICD-10-CM | POA: Diagnosis not present

## 2017-09-06 DIAGNOSIS — K3189 Other diseases of stomach and duodenum: Secondary | ICD-10-CM | POA: Diagnosis not present

## 2017-09-06 DIAGNOSIS — I10 Essential (primary) hypertension: Secondary | ICD-10-CM | POA: Diagnosis not present

## 2017-09-06 DIAGNOSIS — G20 Parkinson's disease: Secondary | ICD-10-CM | POA: Diagnosis not present

## 2017-09-06 DIAGNOSIS — E785 Hyperlipidemia, unspecified: Secondary | ICD-10-CM | POA: Diagnosis not present

## 2017-09-06 DIAGNOSIS — K2270 Barrett's esophagus without dysplasia: Secondary | ICD-10-CM | POA: Diagnosis not present

## 2017-09-06 DIAGNOSIS — Z8673 Personal history of transient ischemic attack (TIA), and cerebral infarction without residual deficits: Secondary | ICD-10-CM | POA: Diagnosis not present

## 2017-09-06 DIAGNOSIS — Z86010 Personal history of colonic polyps: Secondary | ICD-10-CM | POA: Diagnosis not present

## 2017-09-06 DIAGNOSIS — I4891 Unspecified atrial fibrillation: Secondary | ICD-10-CM | POA: Diagnosis not present

## 2017-09-06 DIAGNOSIS — Z09 Encounter for follow-up examination after completed treatment for conditions other than malignant neoplasm: Secondary | ICD-10-CM | POA: Diagnosis not present

## 2017-09-06 DIAGNOSIS — K2930 Chronic superficial gastritis without bleeding: Secondary | ICD-10-CM | POA: Diagnosis not present

## 2017-09-08 ENCOUNTER — Ambulatory Visit (INDEPENDENT_AMBULATORY_CARE_PROVIDER_SITE_OTHER): Payer: Medicare HMO | Admitting: *Deleted

## 2017-09-08 DIAGNOSIS — M5412 Radiculopathy, cervical region: Secondary | ICD-10-CM | POA: Diagnosis not present

## 2017-09-08 DIAGNOSIS — I639 Cerebral infarction, unspecified: Secondary | ICD-10-CM

## 2017-09-09 NOTE — Progress Notes (Signed)
Carelink Summary Report / Loop Recorder 

## 2017-09-15 DIAGNOSIS — M4716 Other spondylosis with myelopathy, lumbar region: Secondary | ICD-10-CM | POA: Diagnosis not present

## 2017-09-15 DIAGNOSIS — M25551 Pain in right hip: Secondary | ICD-10-CM | POA: Diagnosis not present

## 2017-09-15 DIAGNOSIS — S3992XA Unspecified injury of lower back, initial encounter: Secondary | ICD-10-CM | POA: Diagnosis not present

## 2017-09-15 DIAGNOSIS — M1611 Unilateral primary osteoarthritis, right hip: Secondary | ICD-10-CM | POA: Diagnosis not present

## 2017-09-16 DIAGNOSIS — N402 Nodular prostate without lower urinary tract symptoms: Secondary | ICD-10-CM | POA: Diagnosis not present

## 2017-09-16 DIAGNOSIS — C61 Malignant neoplasm of prostate: Secondary | ICD-10-CM | POA: Diagnosis not present

## 2017-09-20 LAB — CUP PACEART REMOTE DEVICE CHECK
Date Time Interrogation Session: 20190727021010
Implantable Pulse Generator Implant Date: 20170323

## 2017-09-22 DIAGNOSIS — M65342 Trigger finger, left ring finger: Secondary | ICD-10-CM | POA: Diagnosis not present

## 2017-09-22 DIAGNOSIS — M180 Bilateral primary osteoarthritis of first carpometacarpal joints: Secondary | ICD-10-CM | POA: Diagnosis not present

## 2017-09-22 DIAGNOSIS — M65332 Trigger finger, left middle finger: Secondary | ICD-10-CM | POA: Diagnosis not present

## 2017-09-23 DIAGNOSIS — C61 Malignant neoplasm of prostate: Secondary | ICD-10-CM | POA: Diagnosis not present

## 2017-09-24 ENCOUNTER — Encounter: Payer: Self-pay | Admitting: Radiation Oncology

## 2017-09-24 ENCOUNTER — Telehealth: Payer: Self-pay | Admitting: *Deleted

## 2017-09-24 NOTE — Telephone Encounter (Signed)
   [facility] Pre-operative Risk Assessment    Request for surgical clearance:  1. What type of surgery is being performed? RELEASE A-1 PULLEY LEFT MIDDLE/RING FINGERS LT THUMB CMC SUSPENSIONPLASTY, FCR TRANSFER   2. When is this surgery scheduled?  01/27/2018   3. What type of clearance is required (medical clearance vs. Pharmacy clearance to hold med vs. Both)? BOTH  4. Are there any medications that need to be held prior to surgery and how long? PLAVIX--NOT SPECIFIED HOW LONG TO HOLD   5. Practice name and name of physician performing surgery?  [facility]   6. What is your office phone number [phone]    7.   What is your office fax number [phone]  8.   Anesthesia type (None, local, MAC, general) ?  AXILLARY BLOCK   Nuala Alpha 09/24/2017, 3:04 PM  _________________________________________________________________   (provider comments below)

## 2017-09-24 NOTE — Telephone Encounter (Signed)
   Primary Cardiologist: Will Meredith Leeds, MD  Chart reviewed as part of pre-operative protocol coverage. Given past medical history and time since last visit, based on ACC/AHA guidelines, Joshua [name] would be at acceptable risk for the planned procedure without further cardiovascular testing. His surgery is not scheduled until 01/2018 and will be set to see Dr. Curt Bears for follow up before that time.   His Plavix recommendations will need to come from Dr. Wells Guiles Tat with neurology, as he is on Plavix for his history of CVA.   I will route this recommendation to the requesting party via Epic fax function and remove from pre-op pool.  Please call with questions.  Kathyrn Drown, NP 09/24/2017, 3:45 PM

## 2017-09-28 DIAGNOSIS — M4716 Other spondylosis with myelopathy, lumbar region: Secondary | ICD-10-CM | POA: Diagnosis not present

## 2017-09-28 DIAGNOSIS — M25551 Pain in right hip: Secondary | ICD-10-CM | POA: Diagnosis not present

## 2017-10-04 DIAGNOSIS — H1045 Other chronic allergic conjunctivitis: Secondary | ICD-10-CM | POA: Diagnosis not present

## 2017-10-04 DIAGNOSIS — J3089 Other allergic rhinitis: Secondary | ICD-10-CM | POA: Diagnosis not present

## 2017-10-04 DIAGNOSIS — J4520 Mild intermittent asthma, uncomplicated: Secondary | ICD-10-CM | POA: Diagnosis not present

## 2017-10-05 LAB — CUP PACEART REMOTE DEVICE CHECK
Date Time Interrogation Session: 20190829020905
Implantable Pulse Generator Implant Date: 20170323

## 2017-10-11 ENCOUNTER — Ambulatory Visit (INDEPENDENT_AMBULATORY_CARE_PROVIDER_SITE_OTHER): Payer: Medicare HMO | Admitting: *Deleted

## 2017-10-11 DIAGNOSIS — I639 Cerebral infarction, unspecified: Secondary | ICD-10-CM | POA: Diagnosis not present

## 2017-10-12 NOTE — Progress Notes (Signed)
Carelink Summary Report / Loop Recorder 

## 2017-10-13 LAB — CUP PACEART REMOTE DEVICE CHECK
Date Time Interrogation Session: 20191001023515
Implantable Pulse Generator Implant Date: 20170323

## 2017-10-18 ENCOUNTER — Ambulatory Visit: Payer: Medicare HMO | Admitting: Radiation Oncology

## 2017-10-21 ENCOUNTER — Encounter: Payer: Self-pay | Admitting: *Deleted

## 2017-10-28 ENCOUNTER — Encounter: Payer: Self-pay | Admitting: Medical Oncology

## 2017-10-28 ENCOUNTER — Ambulatory Visit
Admission: RE | Admit: 2017-10-28 | Discharge: 2017-10-28 | Disposition: A | Discharge status: Home / Self Care | Payer: Medicare HMO | Source: Ambulatory Visit | Attending: Radiation Oncology | Admitting: Radiation Oncology

## 2017-10-28 ENCOUNTER — Other Ambulatory Visit: Payer: Self-pay

## 2017-10-28 ENCOUNTER — Encounter: Payer: Self-pay | Admitting: Radiation Oncology

## 2017-10-28 VITALS — BP 133/99 | HR 86 | Temp 97.7°F | Resp 18 | Ht 73.0 in | Wt 231.1 lb

## 2017-10-28 DIAGNOSIS — Z888 Allergy status to other drugs, medicaments and biological substances status: Secondary | ICD-10-CM | POA: Insufficient documentation

## 2017-10-28 DIAGNOSIS — I4891 Unspecified atrial fibrillation: Secondary | ICD-10-CM | POA: Diagnosis not present

## 2017-10-28 DIAGNOSIS — Z8673 Personal history of transient ischemic attack (TIA), and cerebral infarction without residual deficits: Secondary | ICD-10-CM | POA: Insufficient documentation

## 2017-10-28 DIAGNOSIS — G20 Parkinson's disease: Secondary | ICD-10-CM | POA: Insufficient documentation

## 2017-10-28 DIAGNOSIS — R972 Elevated prostate specific antigen [PSA]: Secondary | ICD-10-CM | POA: Diagnosis not present

## 2017-10-28 DIAGNOSIS — C61 Malignant neoplasm of prostate: Secondary | ICD-10-CM

## 2017-10-28 DIAGNOSIS — Z7902 Long term (current) use of antithrombotics/antiplatelets: Secondary | ICD-10-CM | POA: Insufficient documentation

## 2017-10-28 DIAGNOSIS — Z881 Allergy status to other antibiotic agents status: Secondary | ICD-10-CM | POA: Insufficient documentation

## 2017-10-28 DIAGNOSIS — I10 Essential (primary) hypertension: Secondary | ICD-10-CM | POA: Diagnosis not present

## 2017-10-28 DIAGNOSIS — Z79899 Other long term (current) drug therapy: Secondary | ICD-10-CM | POA: Insufficient documentation

## 2017-10-28 HISTORY — DX: Parkinson's disease without dyskinesia, without mention of fluctuations: G20.A1

## 2017-10-28 HISTORY — DX: Malignant neoplasm of prostate: C61

## 2017-10-28 HISTORY — DX: Parkinson's disease: G20

## 2017-10-28 NOTE — Progress Notes (Addendum)
GU Location of Tumor / Histology: prostatic adenocarcinoma  If Prostate Cancer, Gleason Score is (3 + 4) and PSA is (1.8, 3.6 related to finasteride use). Prostate volume: 27.6  Juston Goheen was referred by Dr. Lutricia Feil in July 2018 to Dr. Pilar Jarvis for further evaluation of a prostate nodule.   Biopsies of prostate (if applicable) revealed:    Past/Anticipated interventions by urology, if any: prostate biopsy, referral to radiation oncology  Past/Anticipated interventions by medical oncology, if any: no  Weight changes, if any: no  Bowel/Bladder complaints, if any: Reports ED. Reports mild dysuria. Denies hematuria. Denies urinary leakage or incontinence. Taking finasteride and tamsulosin. IPSS 21. SHIM 9.   Nausea/Vomiting, if any: no  Pain issues, if any:  Sharp intermittent right hip pain s/p triple fusion. Patient reports he has fallen three times.  SAFETY ISSUES:  Prior radiation? no  Pacemaker/ICD? Loop recorder. Managed by Will Caminitz @ [facility]  Possible current pregnancy? no  Is the patient on methotrexate? no  Current Complaints / other details:  72 year old male. AX: Floxin. Hx stroke in Feb 2017. Married with two daughters. Has hand tremors and raspy voice related to Parkinsons. Resides in [place] with wife. Has two grown daughters and two grandchildren. No pets.

## 2017-10-28 NOTE — Progress Notes (Signed)
Introduced myself to Joshua [name] and his wife as the prostate nurse navigator and my role. After consulting with Dr.Manning, he is interested in  external beam. He is not a candidate for brachytherapy due to urinary symptoms. He states he has Parkinson's and knows the radiation will contribute to his fatigue. We discussed symptom management and encouraged to continue his exercise program. I gave them my business card and asked them to call with questions or concerns.

## 2017-10-28 NOTE — Progress Notes (Signed)
Radiation Oncology         [phone] ________________________________  Initial Outpatient Consultation  Name: Joshua [name] MRN: [id number]  Date: 10/28/2017  DOB: 12/02/1945  PP:IRJJ, Gwyndolyn Saxon, MD  Lucas Mallow, MD   REFERRING PHYSICIAN: Lucas Mallow, MD  DIAGNOSIS: 72 y.o. gentleman with Stage T2a adenocarcinoma of the prostate with Gleason score of 3+4, and PSA of 1.8 (3.6 adjusted on finasteride).    ICD-10-CM   1. Malignant neoplasm of prostate [facility]) [place] Ambulatory referral to Social Work    HISTORY OF PRESENT ILLNESS: Joshua [name] is a 72 y.o. male with a diagnosis of prostate cancer. He was noted to have an prostate nodule by his primary care physician, Dr. Lutricia Feil. His PSA was 1.8 (3.6 adjusted on finasteride) in June 2019 and previously was 1.3 (2.6 adjusted for finasteride) in June 2018. He has been on finasteride and Flomax for management of BPH with BOO for many years. Accordingly, he was referred for evaluation in urology to Dr. Pilar Jarvis on 08/03/17, where a digital rectal examination was performed at that time revealing a a firm, approximately 2 cm nodule at the left base of the prostate.  The patient proceeded to transrectal ultrasound with 12 biopsies of the prostate with Dr. Gloriann Loan on 09/16/17.  The prostate volume measured 27.6 cc.  Out of 12 core biopsies, 2 were positive.  The maximum Gleason score was 3+4, and this was seen in the left mid. Assitionally, there was Gleason 3+3 disease in the right base lateral as well as a core of atypia and high-grade PIN on the right.  Biopsies of prostate revealed:    The patient reviewed the biopsy results with his urologist and he has kindly been referred today for discussion of potential radiation treatment options.  Of note, the patient has a history of CVA and Atrial fibrillation- on Plavix with implanted loop recorder, Parkinson's disease and Barrett's esophagus.  PREVIOUS RADIATION THERAPY: No  PAST MEDICAL  HISTORY:  Past Medical History:  Diagnosis Date  . Allergic rhinitis   . Allergy   . Anal fissure   . Anxiety    from chronic pain from surgery- on Cymbalta  . Arthritis   . Asthma   . Barrett's esophagus 03/29/2014  . Carotid artery disease ([place])    Carotid Doppler normal August, 2007  . Cataract   . Chest pain    Coronaries normal 1996 /  nuclear..06/2002..normal...EF  56% /  stress echo.. May, 2011.... no  scar or ischemia... rate related RBBB  . Diverticulosis   . Dyslipidemia   . Ejection fraction    EF 60%, stress echo, May, 2011  . GERD (gastroesophageal reflux disease)   . Headache   . History of loop recorder    has since 04/04/15  . HTN (hypertension)    takes Metoprolol for PVC control  . Hx of colonic polyps    adenomatous  . Hx of colonoscopy   . Hyperlipidemia   . IFG (impaired fasting glucose)   . Palpitations    Benign PVCs  . Parkinson disease ([place])   . Prostate cancer ([place])   . RBBB (right bundle branch block)    rate related  . Shingles   . Sleep apnea    pt denies  . Stroke [facility])    TIA  . TIA (transient ischemic attack) 02/2015   Per pt, had 2 strokes  . Tremor    Hand tremor  . Vertigo  PAST SURGICAL HISTORY: Past Surgical History:  Procedure Laterality Date  . BACK SURGERY  2002,2009   x 6  . CHOLECYSTECTOMY  11/30/2012   with IOC  . COLONOSCOPY    . ELECTROPHYSIOLOGIC STUDY N/A 09/26/2015   Procedure: V Tach Ablation (PVC);  Surgeon: Will Meredith Leeds, MD;  Location: [place] CV LAB;  Service: Cardiovascular;  Laterality: N/A;  . EP IMPLANTABLE DEVICE N/A 04/04/2015   Procedure: Loop Recorder Insertion;  Surgeon: Will Meredith Leeds, MD;  Location: [place] CV LAB;  Service: Cardiovascular;  Laterality: N/A;  . HERNIA REPAIR     laprascopic  . KNEE SURGERY     DECEMBER 2017,LEFT KNEE SCOPED  . NECK SURGERY  2002  . POLYPECTOMY    . ROTATOR CUFF REPAIR Left   . TEE WITHOUT CARDIOVERSION N/A 04/04/2015   Procedure:  TRANSESOPHAGEAL ECHOCARDIOGRAM (TEE);  Surgeon: Lelon Perla, MD;  Location: [facility] ENDOSCOPY;  Service: Cardiovascular;  Laterality: N/A;  . UPPER GASTROINTESTINAL ENDOSCOPY    . V Tach ablation  09/26/2015    FAMILY HISTORY:  Family History  Problem Relation Age of Onset  . Heart attack Father   . Heart disease Father   . Clotting disorder Father   . Heart disease Brother   . Hypertension Mother   . Breast cancer Paternal Grandmother   . Heart attack Paternal Grandfather   . Esophageal cancer Neg Hx   . Stomach cancer Neg Hx   . Rectal cancer Neg Hx   . Colon polyps Neg Hx     SOCIAL HISTORY:  Social History   Socioeconomic History  . Marital status: Married    Spouse name: [name]   . Number of children: 2  . Years of education: 75  . Highest education level: Not on file  Occupational History  . Occupation: Retired    CommentPresenter, broadcasting  Social Needs  . Financial resource strain: Not on file  . Food insecurity:    Worry: Not on file    Inability: Not on file  . Transportation needs:    Medical: Not on file    Non-medical: Not on file  Tobacco Use  . Smoking status: Never Smoker  . Smokeless tobacco: Never Used  Substance and Sexual Activity  . Alcohol use: No    Alcohol/week: 0.0 standard drinks  . Drug use: No  . Sexual activity: Not Currently  Lifestyle  . Physical activity:    Days per week: Not on file    Minutes per session: Not on file  . Stress: Not on file  Relationships  . Social connections:    Talks on phone: Not on file    Gets together: Not on file    Attends religious service: Not on file    Active member of club or organization: Not on file    Attends meetings of clubs or organizations: Not on file    Relationship status: Not on file  . Intimate partner violence:    Fear of current or ex partner: Not on file    Emotionally abused: Not on file    Physically abused: Not on file    Forced sexual activity: Not on file  Other Topics  Concern  . Not on file  Social History Narrative   Lives with wife.    Caffeine use: Coffee/tea/soda- ocassionally   Right-handed  Married with two daughters and two grandchildren. Resides in [place] with wife.  Patient accompanied by his wife and pastor today.  ALLERGIES:  Ofloxacin and Terazosin  MEDICATIONS:  Current Outpatient Medications  Medication Sig Dispense Refill  . albuterol (PROVENTIL HFA;VENTOLIN HFA) 108 (90 BASE) MCG/ACT inhaler Inhale 2 puffs into the lungs every 4 (four) hours as needed for wheezing.     . carbidopa-levodopa (SINEMET IR) 25-100 MG tablet Take 1 tablet by mouth 3 (three) times daily. 270 tablet 1  . clopidogrel (PLAVIX) 75 MG tablet Take 1 tablet (75 mg total) by mouth daily. 30 tablet 2  . DULoxetine (CYMBALTA) 60 MG capsule Take by mouth daily.     [name] DYMISTA 137-50 MCG/ACT SUSP Place 1 puff into both nostrils at bedtime.     . finasteride (PROSCAR) 5 MG tablet Take 5 mg by mouth Daily.    . flecainide (TAMBOCOR) 150 MG tablet TAKE 1/2 TABLET BY MOUTH EVERY 12 HOURS 90 tablet 2  . lansoprazole (PREVACID) 30 MG capsule Take 1 capsule (30 mg total) by mouth 2 (two) times daily. 60 capsule 6  . levocetirizine (XYZAL) 5 MG tablet Take 5 mg by mouth every evening.      . methocarbamol (ROBAXIN) 500 MG tablet TAKE 1 TABLET (500 MG TOTAL) BY MOUTH FOUR (4) TIMES A DAY.  0  . montelukast (SINGULAIR) 10 MG tablet Take 10 mg by mouth at bedtime.      . pramipexole (MIRAPEX) 0.5 MG tablet Take 1 tablet (0.5 mg total) by mouth 3 (three) times daily. 270 tablet 1  . PROCTOSOL HC 2.5 % rectal cream APPLY INTO AND AROUND RECTUM 2 TIMES A DAY 28.35 g 0  . rosuvastatin (CRESTOR) 10 MG tablet Take 5 mg by mouth daily. Take 1/2 tablet daily    . tamsulosin (FLOMAX) 0.4 MG CAPS capsule Take 0.4 mg by mouth daily after supper.     . traMADol (ULTRAM) 50 MG tablet Take 50 mg by mouth every 6 (six) hours as needed. for pain  0  . zolpidem (AMBIEN) 10 MG tablet Take 10 mg  by mouth at bedtime.     No current facility-administered medications for this encounter.     REVIEW OF SYSTEMS:  On review of systems, the patient reports that he is doing well overall. He denies any chest pain, shortness of breath, cough, fevers, chills, night sweats, or unintended weight changes. He denies any bowel disturbances, and denies abdominal pain, nausea or vomiting. He reports sharp intermittent right hip pain s/p triple fusion. He reports he has fallen three times. He denies any new musculoskeletal or joint aches or pains. His IPSS was 21, indicating severe urinary symptoms, even though he is on Flomax and finasteride. He reports mild dysuria. He denies hematuria, leakage or incontinence. His SHIM was 9, indicating he does have erectile dysfunction. A complete review of systems is obtained and is otherwise negative.    PHYSICAL EXAM:  Wt Readings from Last 3 Encounters:  10/28/17 231 lb 2 oz (104.8 kg)  09/01/17 234 lb (106.1 kg)  07/20/17 232 lb (105.2 kg)   Temp Readings from Last 3 Encounters:  10/28/17 97.7 F (36.5 C) (Oral)  11/22/16 98.3 F (36.8 C) (Oral)  12/26/15 99.1 F (37.3 C)   BP Readings from Last 3 Encounters:  10/28/17 (!) 133/99  09/01/17 (!) 154/86  07/20/17 120/80   Pulse Readings from Last 3 Encounters:  10/28/17 86  09/01/17 96  07/20/17 90   Pain Assessment Pain Score: 0-No pain/10  In general this is a well appearing caucasian male in no acute distress. He is alert and oriented  x4 and appropriate throughout the examination. HEENT reveals that the patient is normocephalic, atraumatic. EOMs are intact. PERRLA. Skin is intact without any evidence of gross lesions. Cardiovascular exam reveals a regular rate and rhythm, no clicks rubs or murmurs are auscultated. Chest is clear to auscultation bilaterally. Lymphatic assessment is performed and does not reveal any adenopathy in the cervical, supraclavicular, axillary, or inguinal chains. Abdomen has  active bowel sounds in all quadrants and is intact. The abdomen is soft, non tender, non distended. Lower extremities are negative for pretibial pitting edema, deep calf tenderness, cyanosis or clubbing.   KPS = 100  100 - Normal; no complaints; no evidence of disease. 90   - Able to carry on normal activity; minor signs or symptoms of disease. 80   - Normal activity with effort; some signs or symptoms of disease. 52   - Cares for self; unable to carry on normal activity or to do active work. 60   - Requires occasional assistance, but is able to care for most of his personal needs. 50   - Requires considerable assistance and frequent medical care. 48   - Disabled; requires special care and assistance. 31   - Severely disabled; hospital admission is indicated although death not imminent. 3   - Very sick; hospital admission necessary; active supportive treatment necessary. 10   - Moribund; fatal processes progressing rapidly. 0     - Dead  Karnofsky DA, Abelmann Sabinal, Craver LS and Burchenal [facility] [phone]) The use of the nitrogen mustards in the palliative treatment of carcinoma: with particular reference to bronchogenic carcinoma Cancer 1 634-56  LABORATORY DATA:  Lab Results  Component Value Date   WBC 8.4 09/18/2015   HGB 14.8 09/18/2015   HCT 42.1 09/18/2015   MCV 89.4 09/18/2015   PLT 200 09/18/2015   Lab Results  Component Value Date   NA 138 09/18/2015   K 4.4 09/18/2015   CL 105 09/18/2015   CO2 22 09/18/2015   Lab Results  Component Value Date   ALT 22 08/25/2015   AST 23 08/25/2015   ALKPHOS 52 08/25/2015   BILITOT 1.1 08/25/2015     RADIOGRAPHY: No results found.    IMPRESSION/PLAN: 1. 72 y.o. gentleman with Stage T2a adenocarcinoma of the prostate with Gleason Score of 3+4, and PSA of 3.6- adjusted for finasteride. We discussed the patient's workup and outlined the nature of prostate cancer in this setting. The patient's T stage, Gleason's score, and PSA put him  into the favorable, intermediate risk group. Accordingly, he is eligible for a variety of treatment options including 5.5 weeks of external radiation, brachytherapy or prostatectomy. We discussed the available radiation techniques, and focused on the details and logistics and delivery. We discussed and outlined the risks, benefits, short and long-term effects associated with radiotherapy and compared and contrasted these with prostatectomy. He is not an ideal surgical candidate given his PMH with multiple medical comorbidites and is not felt to be an ideal candidate for brachytherapy given his persistent severe LUTS (IPSS 21) despite maximal medical therapy with Flomax and Finasteride for years. We discussed the role of SpaceOAR in reducing the rectal toxicity associated with radiotherapy.   At the conclusion of our conversation, the patient is interested in moving forward with a 5.5 week course of daily external beam radiotherapy. We will contact [facility] to make arrangements for fiducial marker and SpaceOAR placement with Dr. Gloriann Loan prior to simulation. And will proceed with coordinating CTsimulation shortly thereafter. We  will share our discussion with Dr. Gloriann Loan and move forward with scheduling placement of 3 gold fiducial markers into the prostate with SpaceOAR gel placement in preparation to proceed with IMRT in the near future.  We spent 60 minutes face to face with the patient and more than 50% of that time was spent in counseling and/or coordination of care.    Nicholos Johns, PA-C    Tyler Pita, MD  [facility] Direct Dial: [phone]  Fax: [phone] [email or web address]  Skype  LinkedIn  This document serves as a record of services personally performed by Tyler Pita, MD and Freeman Caldron, PA-C. It was created on their behalf by Rae Lips, a trained medical scribe. The creation of this record is based on the scribe's personal observations and  the providers' statements to them. This document has been checked and approved by the attending providers.

## 2017-10-28 NOTE — Progress Notes (Signed)
See progress note under physician encounter. 

## 2017-11-01 ENCOUNTER — Telehealth: Payer: Self-pay

## 2017-11-01 DIAGNOSIS — C61 Malignant neoplasm of prostate: Secondary | ICD-10-CM | POA: Insufficient documentation

## 2017-11-01 NOTE — Telephone Encounter (Signed)
LMOVM requesting that pt send manual transmission b/c home monitor has not updated in at least 14 days.    

## 2017-11-02 DIAGNOSIS — Z23 Encounter for immunization: Secondary | ICD-10-CM | POA: Diagnosis not present

## 2017-11-04 ENCOUNTER — Ambulatory Visit (INDEPENDENT_AMBULATORY_CARE_PROVIDER_SITE_OTHER): Payer: Medicare HMO | Admitting: Cardiology

## 2017-11-04 ENCOUNTER — Encounter: Payer: Self-pay | Admitting: Cardiology

## 2017-11-04 ENCOUNTER — Encounter: Payer: Self-pay | Admitting: General Practice

## 2017-11-04 VITALS — BP 126/78 | HR 76 | Ht 73.0 in | Wt 232.0 lb

## 2017-11-04 DIAGNOSIS — I493 Ventricular premature depolarization: Secondary | ICD-10-CM

## 2017-11-04 DIAGNOSIS — I10 Essential (primary) hypertension: Secondary | ICD-10-CM

## 2017-11-04 NOTE — Patient Instructions (Signed)
Medication Instructions:  Your physician recommends that you continue on your current medications as directed. Please refer to the Current Medication list given to you today.  If you need a refill on your cardiac medications before your next appointment, please call your pharmacy.   Lab work: None ordered  Testing/Procedures: None ordered  Follow-Up: At [facility], you and your health needs are our priority.  As part of our continuing mission to provide you with exceptional heart care, we have created designated Provider Care Teams.  These Care Teams include your primary Cardiologist (physician) and Advanced Practice Providers (APPs -  Physician Assistants and Nurse Practitioners) who all work together to provide you with the care you need, when you need it. You will need a follow up appointment in 1 years.  Please call our office 2 months in advance to schedule this appointment.  You may see Dr. Curt Bears or one of the following Advanced Practice Providers on your designated Care Team:   Chanetta Marshall, NP . Tommye Standard, PA-C  Thank you for choosing [facility]!!   Trinidad Curet, RN [phone]

## 2017-11-04 NOTE — Progress Notes (Signed)
Electrophysiology Office Note   Date:  11/04/2017   ID:  Joshua [name], DOB 1945/04/10, MRN [id number]  PCP:  Marton Redwood, MD  Primary Electrophysiologist:  Constance Haw, MD    No chief complaint on file.    History of Present Illness: Joshua [name] is a 72 y.o. male who presents today for electrophysiology evaluation.   He has a history of CVA, OSA, HLD, GERD, HTN, RBBB.  His stroke occurred 02/2015.  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  A linq monitor was implanted. He was found to have an excessive number of PVCs, up to 26% on Holter monitor. He had ablation of his PVCs with return of PVCs post ablation and was placed on flecainide. He was previously on metoprolol which was switched to propranolol due to an essential tremor.    Today, denies symptoms of palpitations, chest pain, shortness of breath, orthopnea, PND, lower extremity edema, claudication, dizziness, presyncope, syncope, bleeding, or neurologic sequela. The patient is tolerating medications without difficulties.  Overall he is feeling well without major complaint.  Unfortunately over the last year, he was diagnosed with Parkinson's.  He is currently on medications for this.  He also was diagnosed with prostate cancer and is going to start radiation therapy.   Past Medical History:  Diagnosis Date  . Allergic rhinitis   . Allergy   . Anal fissure   . Anxiety    from chronic pain from surgery- on Cymbalta  . Arthritis   . Asthma   . Barrett's esophagus 03/29/2014  . Carotid artery disease ([place])    Carotid Doppler normal August, 2007  . Cataract   . Chest pain    Coronaries normal 1996 /  nuclear..06/2002..normal...EF  56% /  stress echo.. May, 2011.... no  scar or ischemia... rate related RBBB  . Diverticulosis   . Dyslipidemia   . Ejection fraction    EF 60%, stress echo, May, 2011  . GERD (gastroesophageal reflux disease)     . Headache   . History of loop recorder    has since 04/04/15  . HTN (hypertension)    takes Metoprolol for PVC control  . Hx of colonic polyps    adenomatous  . Hx of colonoscopy   . Hyperlipidemia   . IFG (impaired fasting glucose)   . Palpitations    Benign PVCs  . Parkinson disease ([place])   . Prostate cancer ([place])   . RBBB (right bundle branch block)    rate related  . Shingles   . Sleep apnea    pt denies  . Stroke [facility])    TIA  . TIA (transient ischemic attack) 02/2015   Per pt, had 2 strokes  . Tremor    Hand tremor  . Vertigo    Past Surgical History:  Procedure Laterality Date  . BACK SURGERY  2002,2009   x 6  . CHOLECYSTECTOMY  11/30/2012   with IOC  . COLONOSCOPY    . ELECTROPHYSIOLOGIC STUDY N/A 09/26/2015   Procedure: V Tach Ablation (PVC);  Surgeon: Will Meredith Leeds, MD;  Location: [place] CV LAB;  Service: Cardiovascular;  Laterality: N/A;  . EP IMPLANTABLE DEVICE N/A 04/04/2015   Procedure: Loop Recorder Insertion;  Surgeon: Will Meredith Leeds, MD;  Location: [place] CV LAB;  Service: Cardiovascular;  Laterality: N/A;  . HERNIA REPAIR     laprascopic  . KNEE  SURGERY     DECEMBER 2017,LEFT KNEE SCOPED  . NECK SURGERY  2002  . POLYPECTOMY    . ROTATOR CUFF REPAIR Left   . TEE WITHOUT CARDIOVERSION N/A 04/04/2015   Procedure: TRANSESOPHAGEAL ECHOCARDIOGRAM (TEE);  Surgeon: Lelon Perla, MD;  Location: [facility] ENDOSCOPY;  Service: Cardiovascular;  Laterality: N/A;  . UPPER GASTROINTESTINAL ENDOSCOPY    . V Tach ablation  09/26/2015     Current Outpatient Medications  Medication Sig Dispense Refill  . albuterol (PROVENTIL HFA;VENTOLIN HFA) 108 (90 BASE) MCG/ACT inhaler Inhale 2 puffs into the lungs every 4 (four) hours as needed for wheezing.     . carbidopa-levodopa (SINEMET IR) 25-100 MG tablet Take 1 tablet by mouth 3 (three) times daily. 270 tablet 1  . clopidogrel (PLAVIX) 75 MG tablet Take 1 tablet (75 mg total) by mouth daily. 30  tablet 2  . DULoxetine (CYMBALTA) 60 MG capsule Take by mouth daily.     [name] DYMISTA 137-50 MCG/ACT SUSP Place 1 puff into both nostrils at bedtime.     . finasteride (PROSCAR) 5 MG tablet Take 5 mg by mouth Daily.    . flecainide (TAMBOCOR) 150 MG tablet TAKE 1/2 TABLET BY MOUTH EVERY 12 HOURS 90 tablet 2  . lansoprazole (PREVACID) 30 MG capsule Take 1 capsule (30 mg total) by mouth 2 (two) times daily. 60 capsule 6  . levocetirizine (XYZAL) 5 MG tablet Take 5 mg by mouth every evening.      . methocarbamol (ROBAXIN) 500 MG tablet TAKE 1 TABLET (500 MG TOTAL) BY MOUTH FOUR (4) TIMES A DAY.  0  . montelukast (SINGULAIR) 10 MG tablet Take 10 mg by mouth at bedtime.      . pramipexole (MIRAPEX) 0.5 MG tablet Take 1 tablet (0.5 mg total) by mouth 3 (three) times daily. 270 tablet 1  . PROCTOSOL HC 2.5 % rectal cream APPLY INTO AND AROUND RECTUM 2 TIMES A DAY 28.35 g 0  . rosuvastatin (CRESTOR) 10 MG tablet Take 5 mg by mouth daily. Take 1/2 tablet daily    . tamsulosin (FLOMAX) 0.4 MG CAPS capsule Take 0.4 mg by mouth daily after supper.     . traMADol (ULTRAM) 50 MG tablet Take 50 mg by mouth every 6 (six) hours as needed. for pain  0  . zolpidem (AMBIEN) 10 MG tablet Take 10 mg by mouth at bedtime.     No current facility-administered medications for this visit.     Allergies:   Ofloxacin and Terazosin   Social History:  The patient  reports that he has never smoked. He has never used smokeless tobacco. He reports that he does not drink alcohol or use drugs.   Family History:  The patient's family history includes Breast cancer in his paternal grandmother; Clotting disorder in his father; Heart attack in his father and paternal grandfather; Heart disease in his brother and father; Hypertension in his mother.    ROS:  Please see the history of present illness.   Otherwise, review of systems is positive for palpitations, difficulty urinating, back pain, muscle pain, back problems.   All other  systems are reviewed and negative.   PHYSICAL EXAM: VS:  BP 126/78   Pulse 76   Ht 6\' 1"  (1.854 m)   Wt 232 lb (105.2 kg)   BMI 30.61 kg/m  , BMI Body mass index is 30.61 kg/m. GEN: Well nourished, well developed, in no acute distress  HEENT: normal  Neck: no JVD, carotid bruits,  or masses Cardiac: RRR; no murmurs, rubs, or gallops,no edema  Respiratory:  clear to auscultation bilaterally, normal work of breathing GI: soft, nontender, nondistended, + BS MS: no deformity or atrophy  Skin: warm and dry, device site well healed Neuro:  Strength and sensation are intact Psych: euthymic mood, full affect  EKG:  EKG is ordered today. Personal review of the ekg ordered shows sinus rhythm, right bundle branch block, voltage criteria for LVH  Personal review of the device interrogation today. Results in [place]: No results found for requested labs within last 8760 hours.    Lipid Panel     Component Value Date/Time   CHOL 137 03/06/2015 0550   TRIG 105 03/06/2015 0550   HDL 41 03/06/2015 0550   CHOLHDL 3.3 03/06/2015 0550   VLDL 21 03/06/2015 0550   LDLCALC 75 03/06/2015 0550     Wt Readings from Last 3 Encounters:  11/04/17 232 lb (105.2 kg)  10/28/17 231 lb 2 oz (104.8 kg)  09/01/17 234 lb (106.1 kg)      Other studies Reviewed: Additional studies/ records that were reviewed today include: TEE 04/04/15  Review of the above records today demonstrates:  - Left ventricle: Systolic function was normal. The estimated  ejection fraction was in the range of 50% to 55%. Wall motion was  normal; there were no regional wall motion abnormalities. - Aortic valve: No evidence of vegetation. There was trivial  regurgitation. - Mitral valve: No evidence of vegetation. There was mild  regurgitation. - Left atrium: The atrium was mildly dilated. No evidence of  thrombus in the atrial cavity or appendage. - Right atrium: No evidence of thrombus in the atrial  cavity or  appendage. - Atrial septum: There was a patent foramen ovale. - Tricuspid valve: No evidence of vegetation. - Pulmonic valve: No evidence of vegetation.  Holter 06/26/15 Average HR: 88 bpm Minimum HR: 56 bpm Maximum HR: 128 bpm  26% PVCs  ASSESSMENT AND PLAN:  1.  PVCs: Had ablation with intermittent suppression.  He is currently on flecainide with a reduced PVC burden.  No changes.  2. Hypertension: Well-controlled today.  No changes.  Current medicines are reviewed at length with the patient today.   The patient does not have concerns regarding his medicines.  The following changes were made today: None  Labs/ tests ordered today include:  Orders Placed This Encounter  Procedures  . EKG 12-Lead     Disposition:   FU with Will Camnitz 12 months   Signed, Will Meredith Leeds, MD  11/04/2017 12:11 PM     [address] [place] [phone] (office) [phone] (fax)

## 2017-11-04 NOTE — Progress Notes (Signed)
[facility] Psychosocial Distress Screening Clinical Social Work  Clinical Social Work was referred by distress screening protocol.  The patient scored a 7 on the Psychosocial Distress Thermometer which indicates moderate distress. Clinical Social Worker contacted patient by phone to assess for distress and other psychosocial needs. Reached patient but he was in waiting room for another MD appointment.  CSW will mail information packet w contact information, encouraged patient to contact [facility] as needed for support/resources.    ON[facility]N DISTRESS SCREENING 10/28/2017  Screening Type Initial Screening  Distress experienced in past week (1-10) 7  Emotional problem type Nervousness/Anxiety  Physical Problem type Pain;Getting around;Constipation/diarrhea;Changes in urination;Tingling hands/feet  Physician notified of physical symptoms Yes  Referral to clinical psychology No  Referral to clinical social work No  Referral to dietition No  Referral to financial advocate No  Referral to support programs No  Referral to palliative care No  Other [phone]    Clinical Social Worker follow up needed: No.  If yes, follow up plan:  Beverely Pace, [place], LCSW Clinical Social Worker Phone:  [phone]

## 2017-11-07 IMAGING — NM NM DATSCAN
3 series · 18 of 18 positions shown · non-contrast
Comparison: Brain MRI 03/05/2015

CLINICAL DATA: Bilateral upper extremity tremors. Double vision.
Slow gait. RIGHT leg tremors when stepping

EXAM:
NUCLEAR MEDICINE BRAIN IMAGING WITH SPECT  (DaTscan )
TECHNIQUE: SPECT images of the brain were obtained after intravenous injection
of radiopharmaceutical. 4 hour post injection imaging. Appropriate
positioning. 0.8 ml lugols solution administered in a.m
RADIOPHARMACEUTICALS:  4.6 millicuries I 123 Ioflupane

[Series 1: brain spect · 4.14mm/px · 6 of 120 frames shown]
[frame 11/120  full-range]
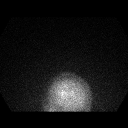
[frame 31/120  full-range]
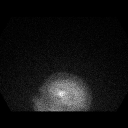
[frame 51/120  full-range]
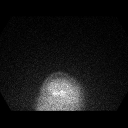
[frame 71/120  full-range]
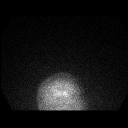
[frame 91/120  full-range]
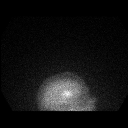
[frame 111/120  full-range]
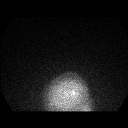

[Series 1: spect - (id) _(id)_cor · 4.1mm · 4.14mm/px · 6 of 128 frames shown]
[frame 11/128]
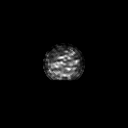
[frame 32/128]
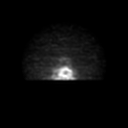
[frame 54/128]
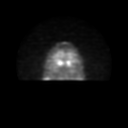
[frame 75/128]
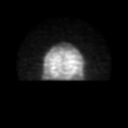
[frame 96/128]
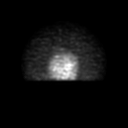
[frame 118/128]
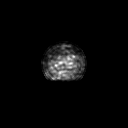

[Series 1: spect - (id) _(id) · 4.1mm · 4.14mm/px · 6 of 128 frames shown]
[frame 11/128]
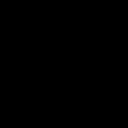
[frame 32/128]
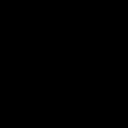
[frame 54/128]
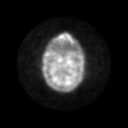
[frame 75/128]
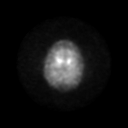
[frame 96/128]
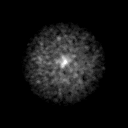
[frame 118/128]
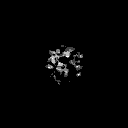

[18 of 18 positions shown; findings below may reference images not displayed]

FINDINGS: There is decreased radiotracer accumulation within the LEFT and
RIGHT putamen. Focal activity accumulates within the LEFT and RIGHT
head to the caudate nuclei. The head of the RIGHT caudate nuclei is
slightly more intense than the LEFT.
IMPRESSION: Loss of signal intensity within LEFT and RIGHT putamen is consistent
with a Parkinson's syndrome type pattern of decreased dopamine
transport populations in the striata.

## 2017-11-10 LAB — CUP PACEART INCLINIC DEVICE CHECK
Date Time Interrogation Session: 20191024151922
Implantable Pulse Generator Implant Date: 20170323

## 2017-11-11 DIAGNOSIS — H5203 Hypermetropia, bilateral: Secondary | ICD-10-CM | POA: Diagnosis not present

## 2017-11-11 DIAGNOSIS — H5010 Unspecified exotropia: Secondary | ICD-10-CM | POA: Diagnosis not present

## 2017-11-11 DIAGNOSIS — H2513 Age-related nuclear cataract, bilateral: Secondary | ICD-10-CM | POA: Diagnosis not present

## 2017-11-15 ENCOUNTER — Ambulatory Visit (INDEPENDENT_AMBULATORY_CARE_PROVIDER_SITE_OTHER): Payer: Medicare HMO | Admitting: *Deleted

## 2017-11-15 DIAGNOSIS — I639 Cerebral infarction, unspecified: Secondary | ICD-10-CM | POA: Diagnosis not present

## 2017-11-15 NOTE — Progress Notes (Signed)
Carelink Summary Report / Loop Recorder 

## 2017-12-02 DIAGNOSIS — C61 Malignant neoplasm of prostate: Secondary | ICD-10-CM | POA: Diagnosis not present

## 2017-12-06 ENCOUNTER — Other Ambulatory Visit: Payer: Self-pay | Admitting: Orthopedic Surgery

## 2017-12-07 ENCOUNTER — Encounter: Payer: Self-pay | Admitting: Urology

## 2017-12-07 ENCOUNTER — Other Ambulatory Visit: Payer: Self-pay | Admitting: Urology

## 2017-12-07 DIAGNOSIS — C61 Malignant neoplasm of prostate: Secondary | ICD-10-CM

## 2017-12-07 LAB — CUP PACEART REMOTE DEVICE CHECK
Date Time Interrogation Session: 20191103024031
Implantable Pulse Generator Implant Date: 20170323

## 2017-12-07 NOTE — Progress Notes (Signed)
  Radiation Oncology         [phone]) [phone] ________________________________  Name: Joshua [name] MRN: [id number]  Date: 12/08/2017  DOB: October 21, 1945  SIMULATION AND TREATMENT PLANNING NOTE    ICD-10-CM   1. Malignant neoplasm of prostate ([place]) C61     DIAGNOSIS:  72 y.o. gentleman with Stage T2a adenocarcinoma of the prostate with Gleason score of 3+4, and PSA of 1.8 (3.6 adjusted on finasteride).  NARRATIVE:  The patient was brought to the [facility].  Identity was confirmed.  All relevant records and images related to the planned course of therapy were reviewed.  The patient freely provided informed written consent to proceed with treatment after reviewing the details related to the planned course of therapy. The consent form was witnessed and verified by the simulation staff.  Then, the patient was set-up in a stable reproducible supine position for radiation therapy.  A vacuum lock pillow device was custom fabricated to position his legs in a reproducible immobilized position.  Then, I performed a urethrogram under sterile conditions to identify the prostatic apex.  CT images were obtained.  Surface markings were placed.  The CT images were loaded into the planning software.  Then the prostate target and avoidance structures including the rectum, bladder, bowel and hips were contoured.  Treatment planning then occurred.  The radiation prescription was entered and confirmed.  A total of one complex treatment devices was fabricated. I have requested : Intensity Modulated Radiotherapy (IMRT) is medically necessary for this case for the following reason:  Rectal sparing.[name]  PLAN:  The patient will receive 70 Gy in 28 fractions.  ________________________________  Sheral Apley Tammi Klippel, M.D.

## 2017-12-07 NOTE — Progress Notes (Signed)
I confirmed with [facility] that the patient had fiducial markers and SpaceOAR gel placed in the office with Dr. Gloriann Loan on 12/02/17.  He is scheduled for CT SIM on 12/08/17 and will need MRI prostate to follow.  Orders placed and will reach out to Romie Jumper to coordinate his imaging with upcoming CT SIM.   Nicholos Johns, MMS, PA-C [place] at [place]: [phone]  Fax: [phone]

## 2017-12-08 ENCOUNTER — Ambulatory Visit
Admission: RE | Admit: 2017-12-08 | Discharge: 2017-12-08 | Disposition: A | Discharge status: Home / Self Care | Payer: Medicare HMO | Source: Ambulatory Visit | Attending: Radiation Oncology | Admitting: Radiation Oncology

## 2017-12-08 DIAGNOSIS — C61 Malignant neoplasm of prostate: Secondary | ICD-10-CM | POA: Insufficient documentation

## 2017-12-13 ENCOUNTER — Ambulatory Visit (HOSPITAL_COMMUNITY)
Admission: RE | Admit: 2017-12-13 | Discharge: 2017-12-13 | Disposition: A | Discharge status: Home / Self Care | Payer: Medicare HMO | Source: Ambulatory Visit | Attending: Urology | Admitting: Urology

## 2017-12-13 DIAGNOSIS — C61 Malignant neoplasm of prostate: Secondary | ICD-10-CM

## 2017-12-16 ENCOUNTER — Ambulatory Visit (INDEPENDENT_AMBULATORY_CARE_PROVIDER_SITE_OTHER): Payer: Medicare HMO

## 2017-12-16 DIAGNOSIS — I639 Cerebral infarction, unspecified: Secondary | ICD-10-CM

## 2017-12-17 NOTE — Progress Notes (Signed)
Carelink Summary Report / Loop Recorder 

## 2017-12-20 DIAGNOSIS — C61 Malignant neoplasm of prostate: Secondary | ICD-10-CM | POA: Diagnosis not present

## 2017-12-22 ENCOUNTER — Ambulatory Visit
Admission: RE | Admit: 2017-12-22 | Discharge: 2017-12-22 | Disposition: A | Discharge status: Home / Self Care | Payer: Medicare HMO | Source: Ambulatory Visit | Attending: Radiation Oncology | Admitting: Radiation Oncology

## 2017-12-22 ENCOUNTER — Encounter: Payer: Self-pay | Admitting: Medical Oncology

## 2017-12-22 DIAGNOSIS — C61 Malignant neoplasm of prostate: Secondary | ICD-10-CM | POA: Diagnosis not present

## 2017-12-23 ENCOUNTER — Ambulatory Visit
Admission: RE | Admit: 2017-12-23 | Discharge: 2017-12-23 | Disposition: A | Discharge status: Home / Self Care | Payer: Medicare HMO | Source: Ambulatory Visit | Attending: Radiation Oncology | Admitting: Radiation Oncology

## 2017-12-23 DIAGNOSIS — C61 Malignant neoplasm of prostate: Secondary | ICD-10-CM | POA: Diagnosis not present

## 2017-12-24 ENCOUNTER — Ambulatory Visit
Admission: RE | Admit: 2017-12-24 | Discharge: 2017-12-24 | Disposition: A | Discharge status: Home / Self Care | Payer: Medicare HMO | Source: Ambulatory Visit | Attending: Radiation Oncology | Admitting: Radiation Oncology

## 2017-12-24 DIAGNOSIS — C61 Malignant neoplasm of prostate: Secondary | ICD-10-CM | POA: Diagnosis not present

## 2017-12-27 ENCOUNTER — Ambulatory Visit
Admission: RE | Admit: 2017-12-27 | Discharge: 2017-12-27 | Disposition: A | Discharge status: Home / Self Care | Payer: Medicare HMO | Source: Ambulatory Visit | Attending: Radiation Oncology | Admitting: Radiation Oncology

## 2017-12-27 DIAGNOSIS — C61 Malignant neoplasm of prostate: Secondary | ICD-10-CM | POA: Diagnosis not present

## 2017-12-28 ENCOUNTER — Ambulatory Visit
Admission: RE | Admit: 2017-12-28 | Discharge: 2017-12-28 | Disposition: A | Discharge status: Home / Self Care | Payer: Medicare HMO | Source: Ambulatory Visit | Attending: Radiation Oncology | Admitting: Radiation Oncology

## 2017-12-28 DIAGNOSIS — C61 Malignant neoplasm of prostate: Secondary | ICD-10-CM | POA: Diagnosis not present

## 2017-12-29 ENCOUNTER — Ambulatory Visit
Admission: RE | Admit: 2017-12-29 | Discharge: 2017-12-29 | Disposition: A | Discharge status: Home / Self Care | Payer: Medicare HMO | Source: Ambulatory Visit | Attending: Radiation Oncology | Admitting: Radiation Oncology

## 2017-12-29 DIAGNOSIS — C61 Malignant neoplasm of prostate: Secondary | ICD-10-CM | POA: Diagnosis not present

## 2017-12-30 ENCOUNTER — Ambulatory Visit
Admission: RE | Admit: 2017-12-30 | Discharge: 2017-12-30 | Disposition: A | Discharge status: Home / Self Care | Payer: Medicare HMO | Source: Ambulatory Visit | Attending: Radiation Oncology | Admitting: Radiation Oncology

## 2017-12-30 DIAGNOSIS — C61 Malignant neoplasm of prostate: Secondary | ICD-10-CM | POA: Diagnosis not present

## 2017-12-31 ENCOUNTER — Ambulatory Visit
Admission: RE | Admit: 2017-12-31 | Discharge: 2017-12-31 | Disposition: A | Discharge status: Home / Self Care | Payer: Medicare HMO | Source: Ambulatory Visit | Attending: Radiation Oncology | Admitting: Radiation Oncology

## 2017-12-31 DIAGNOSIS — C61 Malignant neoplasm of prostate: Secondary | ICD-10-CM | POA: Diagnosis not present

## 2018-01-03 ENCOUNTER — Ambulatory Visit
Admission: RE | Admit: 2018-01-03 | Discharge: 2018-01-03 | Disposition: A | Discharge status: Home / Self Care | Payer: Medicare HMO | Source: Ambulatory Visit | Attending: Radiation Oncology | Admitting: Radiation Oncology

## 2018-01-03 DIAGNOSIS — C61 Malignant neoplasm of prostate: Secondary | ICD-10-CM | POA: Diagnosis not present

## 2018-01-04 ENCOUNTER — Ambulatory Visit
Admission: RE | Admit: 2018-01-04 | Discharge: 2018-01-04 | Disposition: A | Discharge status: Home / Self Care | Payer: Medicare HMO | Source: Ambulatory Visit | Attending: Radiation Oncology | Admitting: Radiation Oncology

## 2018-01-04 DIAGNOSIS — C61 Malignant neoplasm of prostate: Secondary | ICD-10-CM | POA: Diagnosis not present

## 2018-01-06 ENCOUNTER — Ambulatory Visit
Admission: RE | Admit: 2018-01-06 | Discharge: 2018-01-06 | Disposition: A | Discharge status: Home / Self Care | Payer: Medicare HMO | Source: Ambulatory Visit | Attending: Radiation Oncology | Admitting: Radiation Oncology

## 2018-01-06 ENCOUNTER — Ambulatory Visit (INDEPENDENT_AMBULATORY_CARE_PROVIDER_SITE_OTHER): Payer: Medicare HMO

## 2018-01-06 ENCOUNTER — Encounter (HOSPITAL_COMMUNITY): Payer: Self-pay | Admitting: Emergency Medicine

## 2018-01-06 ENCOUNTER — Ambulatory Visit (HOSPITAL_COMMUNITY)
Admission: EM | Admit: 2018-01-06 | Discharge: 2018-01-06 | Disposition: A | Discharge status: Home / Self Care | Payer: Medicare HMO | Attending: Family Medicine | Admitting: Family Medicine

## 2018-01-06 DIAGNOSIS — R05 Cough: Secondary | ICD-10-CM

## 2018-01-06 DIAGNOSIS — R0789 Other chest pain: Secondary | ICD-10-CM

## 2018-01-06 DIAGNOSIS — T17908A Unspecified foreign body in respiratory tract, part unspecified causing other injury, initial encounter: Secondary | ICD-10-CM | POA: Insufficient documentation

## 2018-01-06 DIAGNOSIS — J029 Acute pharyngitis, unspecified: Secondary | ICD-10-CM

## 2018-01-06 DIAGNOSIS — T18108A Unspecified foreign body in esophagus causing other injury, initial encounter: Secondary | ICD-10-CM

## 2018-01-06 DIAGNOSIS — C61 Malignant neoplasm of prostate: Secondary | ICD-10-CM | POA: Diagnosis not present

## 2018-01-06 MED ORDER — ALBUTEROL SULFATE HFA 108 (90 BASE) MCG/ACT IN AERS: 2.0000 | INHALATION_SPRAY | RESPIRATORY_TRACT | 0 refills | Status: DC | PRN

## 2018-01-06 MED ORDER — BENZONATATE 200 MG PO CAPS: 200.0000 mg | ORAL_CAPSULE | Freq: Three times a day (TID) | ORAL | 0 refills | Status: DC | PRN

## 2018-01-06 NOTE — ED Triage Notes (Signed)
Pt presents to [facility] for assessment of aspiration that woke him up from sleep, states he felt burning every time he breathed in for approx 2 hours.  This happened Saturday morning.  Patient states he has had a cough, wheezing, and sore throat since.

## 2018-01-06 NOTE — ED Provider Notes (Signed)
[facility]    CSN: [id number] Arrival date & time: 01/06/18  1209     History   Chief Complaint Chief Complaint  Patient presents with  . Sore Throat  . Cough  . Aspiration    HPI Joshua [name] is a 72 y.o. male.   HPI  Patient is undergoing radiation treatment for prostate cancer.  He states this is going well. He states a couple days ago he aspirated while he was sleeping.  He woke up coughing and choking.  At first he had a bit of a sore throat.  Over time he is having increasing cough.  No sputum production.  Some burning remains in his throat.  No fever or chills.  He went for his radiation treatment this morning and they requested that he come here to get a chest x-ray.  Concern for aspiration pneumonia or pneumonitis.  Past Medical History:  Diagnosis Date  . Allergic rhinitis   . Allergy   . Anal fissure   . Anxiety    from chronic pain from surgery- on Cymbalta  . Arthritis   . Asthma   . Barrett's esophagus 03/29/2014  . Carotid artery disease ([place])    Carotid Doppler normal August, 2007  . Cataract   . Chest pain    Coronaries normal 1996 /  nuclear..06/2002..normal...EF  56% /  stress echo.. May, 2011.... no  scar or ischemia... rate related RBBB  . Diverticulosis   . Dyslipidemia   . Ejection fraction    EF 60%, stress echo, May, 2011  . GERD (gastroesophageal reflux disease)   . Headache   . History of loop recorder    has since 04/04/15  . HTN (hypertension)    takes Metoprolol for PVC control  . Hx of colonic polyps    adenomatous  . Hx of colonoscopy   . Hyperlipidemia   . IFG (impaired fasting glucose)   . Palpitations    Benign PVCs  . Parkinson disease ([place])   . Prostate cancer ([place])   . RBBB (right bundle branch block)    rate related  . Shingles   . Sleep apnea    pt denies  . Stroke [facility])    TIA  . TIA (transient ischemic attack) 02/2015   Per pt, had 2 strokes  . Tremor    Hand tremor  . Vertigo      Patient Active Problem List   Diagnosis Date Noted  . Malignant neoplasm of prostate ([place]) 11/01/2017  . Essential tremor 12/06/2015  . PVC (premature ventricular contraction) 09/26/2015  . Embolic stroke involving left middle cerebral artery ([place]) 04/07/2015  . Acute CVA (cerebrovascular accident) ([place]) 03/06/2015  . Stroke (cerebrum) ([place]) 03/05/2015  . CVA (cerebral infarction) 03/05/2015  . Weakness of right upper extremity   . Tremor   . Ejection fraction   . Vertigo   . Sleep apnea   . Palpitations   . Dyslipidemia   . GERD (gastroesophageal reflux disease)   . HTN (hypertension)   . Chest pain   . RBBB (right bundle branch block)   . Ejection fraction   . Carotid artery disease [facility])     Past Surgical History:  Procedure Laterality Date  . BACK SURGERY  2002,2009   x 6  . CHOLECYSTECTOMY  11/30/2012   with IOC  . COLONOSCOPY    . ELECTROPHYSIOLOGIC STUDY N/A 09/26/2015   Procedure: V Tach Ablation (PVC);  Surgeon: Will Meredith Leeds, MD;  Location: [facility]  INVASIVE CV LAB;  Service: Cardiovascular;  Laterality: N/A;  . EP IMPLANTABLE DEVICE N/A 04/04/2015   Procedure: Loop Recorder Insertion;  Surgeon: Will Meredith Leeds, MD;  Location: [facility] CV LAB;  Service: Cardiovascular;  Laterality: N/A;  . HERNIA REPAIR     laprascopic  . KNEE SURGERY     DECEMBER 2017,LEFT KNEE SCOPED  . NECK SURGERY  2002  . POLYPECTOMY    . ROTATOR CUFF REPAIR Left   . TEE WITHOUT CARDIOVERSION N/A 04/04/2015   Procedure: TRANSESOPHAGEAL ECHOCARDIOGRAM (TEE);  Surgeon: Lelon Perla, MD;  Location: [facility] ENDOSCOPY;  Service: Cardiovascular;  Laterality: N/A;  . UPPER GASTROINTESTINAL ENDOSCOPY    . V Tach ablation  09/26/2015       Home Medications    Prior to Admission medications   Medication Sig Start Date End Date Taking? Authorizing Provider  albuterol (PROVENTIL HFA;VENTOLIN HFA) 108 (90 Base) MCG/ACT inhaler Inhale 2 puffs into the lungs every 4 (four) hours as  needed for wheezing. 01/06/18   Raylene Everts, MD  benzonatate (TESSALON) 200 MG capsule Take 1 capsule (200 mg total) by mouth 3 (three) times daily as needed for cough. 01/06/18   Raylene Everts, MD  carbidopa-levodopa (SINEMET IR) 25-100 MG tablet Take 1 tablet by mouth 3 (three) times daily. 07/20/17   Tat, Eustace Quail, DO  clopidogrel (PLAVIX) 75 MG tablet Take 1 tablet (75 mg total) by mouth daily. 03/07/15   Oswald Hillock, MD  DULoxetine (CYMBALTA) 60 MG capsule Take by mouth daily.     [provider]  DYMISTA 137-50 MCG/ACT SUSP Place 1 puff into both nostrils at bedtime.  03/24/11   [provider]  finasteride (PROSCAR) 5 MG tablet Take 5 mg by mouth Daily. 04/17/11   [provider]  flecainide (TAMBOCOR) 150 MG tablet TAKE 1/2 TABLET BY MOUTH EVERY 12 HOURS 02/08/17   Camnitz, Ocie Doyne, MD  lansoprazole (PREVACID) 30 MG capsule Take 1 capsule (30 mg total) by mouth 2 (two) times daily. 06/15/17   Irene Shipper, MD  levocetirizine (XYZAL) 5 MG tablet Take 5 mg by mouth every evening.      [provider]  methocarbamol (ROBAXIN) 500 MG tablet TAKE 1 TABLET (500 MG TOTAL) BY MOUTH FOUR (4) TIMES A DAY. 09/15/17   [provider]  montelukast (SINGULAIR) 10 MG tablet Take 10 mg by mouth at bedtime.      [provider]  pramipexole (MIRAPEX) 0.5 MG tablet Take 1 tablet (0.5 mg total) by mouth 3 (three) times daily. 07/20/17   Tat, Eustace Quail, DO  PROCTOSOL HC 2.5 % rectal cream APPLY INTO AND AROUND RECTUM 2 TIMES A DAY 02/12/17   Irene Shipper, MD  rosuvastatin (CRESTOR) 10 MG tablet Take 5 mg by mouth daily. Take 1/2 tablet daily    [provider]  tamsulosin (FLOMAX) 0.4 MG CAPS capsule Take 0.4 mg by mouth daily after supper.  05/25/13   [provider]  traMADol (ULTRAM) 50 MG tablet Take 50 mg by mouth every 6 (six) hours as needed. for pain 09/28/17   [provider]  zolpidem (AMBIEN) 10 MG tablet Take 10 mg  by mouth at bedtime. 05/13/11   [provider]    Family History Family History  Problem Relation Age of Onset  . Heart attack Father   . Heart disease Father   . Clotting disorder Father   . Heart disease Brother   . Hypertension Mother   .  Breast cancer Paternal Grandmother   . Heart attack Paternal Grandfather   . Esophageal cancer Neg Hx   . Stomach cancer Neg Hx   . Rectal cancer Neg Hx   . Colon polyps Neg Hx     Social History Social History   Tobacco Use  . Smoking status: Never Smoker  . Smokeless tobacco: Never Used  Substance Use Topics  . Alcohol use: No    Alcohol/week: 0.0 standard drinks  . Drug use: No     Allergies   Floxin i.v. in dextrose 5% [ofloxacin] and Terazosin   Review of Systems Review of Systems  Constitutional: Negative for chills and fever.  HENT: Positive for sore throat. Negative for ear pain.   Eyes: Negative for pain and visual disturbance.  Respiratory: Positive for cough and shortness of breath.   Cardiovascular: Negative for chest pain and palpitations.  Gastrointestinal: Negative for abdominal pain and vomiting.  Genitourinary: Negative for dysuria and hematuria.  Musculoskeletal: Negative for arthralgias and back pain.  Skin: Negative for color change and rash.  Neurological: Positive for tremors. Negative for seizures and syncope.       Parkinson's  Psychiatric/Behavioral: Positive for sleep disturbance.  All other systems reviewed and are negative.    Physical Exam Triage Vital Signs ED Triage Vitals  Enc Vitals Group     BP 01/06/18 1322 (!) 134/93     Pulse Rate 01/06/18 1322 69     Resp 01/06/18 1322 18     Temp 01/06/18 1322 98.4 F (36.9 C)     Temp Source 01/06/18 1322 Oral     SpO2 01/06/18 1322 98 %   No data found.  Updated Vital Signs BP (!) 134/93 (BP Location: Right Arm)   Pulse 69   Temp 98.4 F (36.9 C) (Oral)   Resp 18   SpO2 98%    Physical Exam Constitutional:      General:  He is not in acute distress.    Appearance: He is well-developed.     Comments: Frequent cough   HENT:     Head: Normocephalic and atraumatic.     Right Ear: Tympanic membrane and ear canal normal.     Left Ear: Tympanic membrane and ear canal normal.     Nose: No congestion.     Mouth/Throat:     Mouth: Mucous membranes are pale and dry.  Eyes:     Conjunctiva/sclera: Conjunctivae normal.     Pupils: Pupils are equal, round, and reactive to light.  Neck:     Musculoskeletal: Normal range of motion and neck supple.  Cardiovascular:     Rate and Rhythm: Normal rate and regular rhythm.     Heart sounds: No murmur.  Pulmonary:     Effort: Pulmonary effort is normal. No respiratory distress.     Breath sounds: Normal breath sounds.     Comments: Unable to take a deep breath Abdominal:     General: There is no distension.     Palpations: Abdomen is soft.  Musculoskeletal: Normal range of motion.  Skin:    General: Skin is warm and dry.  Neurological:     General: No focal deficit present.     Mental Status: He is alert.  Psychiatric:        Mood and Affect: Mood normal.      UC Treatments / Results  Labs (all labs ordered are listed, but only abnormal results are displayed) Labs Reviewed - No data to display  EKG None  Radiology Dg Chest 2 View  Result Date: 01/06/2018 CLINICAL DATA:  Sick for 5 days with cough.  Chest tightness. EXAM: CHEST - 2 VIEW COMPARISON:  April 24, 2016 FINDINGS: The heart size and mediastinal contours are within normal limits. Both lungs are clear. The visualized skeletal structures are unremarkable. IMPRESSION: No active cardiopulmonary disease. Electronically Signed   By: Dorise Bullion III M.D   On: 01/06/2018 14:21    Procedures Procedures (including critical care time)  Medications Ordered in UC Medications - No data to display  Initial Impression / Assessment and Plan / UC Course  I have reviewed the triage vital signs and the  nursing notes.  Pertinent labs & imaging results that were available during my care of the patient were reviewed by me and considered in my medical decision making (see chart for details).     I believe the acid aspiration was from his reflux disease.  He states that he has had this for a long time he takes Prevacid twice a day.  He is also been treated for Barrett's esophagus.  He is advised to elevate the head of his bed and not eat at bedtime.  I think it caused some bronchial inflammation but there is no sign of pneumonia.  I have prescribed Tessalon for the cough.  He had an albuterol inhaler in years past and states he like to have another one in case he is wheezes.  Follow-up with his primary care doctor. Final Clinical Impressions(s) / UC Diagnoses   Final diagnoses:  Aspiration into airway, initial encounter     Discharge Instructions     Use a humidifier Take the tessalon for cough Use albuterol as needed    ED Prescriptions    Medication Sig Dispense Auth. Provider   albuterol (PROVENTIL HFA;VENTOLIN HFA) 108 (90 Base) MCG/ACT inhaler Inhale 2 puffs into the lungs every 4 (four) hours as needed for wheezing. 1 Inhaler Raylene Everts, MD   benzonatate (TESSALON) 200 MG capsule Take 1 capsule (200 mg total) by mouth 3 (three) times daily as needed for cough. 20 capsule Raylene Everts, MD     Controlled Substance Prescriptions [place] Controlled Substance Registry consulted? Not Applicable   Raylene Everts, MD 01/06/18 1452

## 2018-01-06 NOTE — Discharge Instructions (Signed)
Use a humidifier Take the tessalon for cough Use albuterol as needed

## 2018-01-07 ENCOUNTER — Ambulatory Visit
Admission: RE | Admit: 2018-01-07 | Discharge: 2018-01-07 | Disposition: A | Discharge status: Home / Self Care | Payer: Medicare HMO | Source: Ambulatory Visit | Attending: Radiation Oncology | Admitting: Radiation Oncology

## 2018-01-07 DIAGNOSIS — C61 Malignant neoplasm of prostate: Secondary | ICD-10-CM | POA: Diagnosis not present

## 2018-01-10 ENCOUNTER — Ambulatory Visit
Admission: RE | Admit: 2018-01-10 | Discharge: 2018-01-10 | Disposition: A | Discharge status: Home / Self Care | Payer: Medicare HMO | Source: Ambulatory Visit | Attending: Radiation Oncology | Admitting: Radiation Oncology

## 2018-01-10 DIAGNOSIS — C61 Malignant neoplasm of prostate: Secondary | ICD-10-CM | POA: Diagnosis not present

## 2018-01-11 ENCOUNTER — Ambulatory Visit
Admission: RE | Admit: 2018-01-11 | Discharge: 2018-01-11 | Disposition: A | Discharge status: Home / Self Care | Payer: Medicare HMO | Source: Ambulatory Visit | Attending: Radiation Oncology | Admitting: Radiation Oncology

## 2018-01-11 DIAGNOSIS — C61 Malignant neoplasm of prostate: Secondary | ICD-10-CM | POA: Diagnosis not present

## 2018-01-13 ENCOUNTER — Ambulatory Visit
Admission: RE | Admit: 2018-01-13 | Discharge: 2018-01-13 | Disposition: A | Discharge status: Home / Self Care | Payer: Medicare HMO | Source: Ambulatory Visit | Attending: Radiation Oncology | Admitting: Radiation Oncology

## 2018-01-13 DIAGNOSIS — C61 Malignant neoplasm of prostate: Secondary | ICD-10-CM | POA: Diagnosis not present

## 2018-01-14 ENCOUNTER — Ambulatory Visit
Admission: RE | Admit: 2018-01-14 | Discharge: 2018-01-14 | Disposition: A | Discharge status: Home / Self Care | Payer: Medicare HMO | Source: Ambulatory Visit | Attending: Radiation Oncology | Admitting: Radiation Oncology

## 2018-01-14 DIAGNOSIS — C61 Malignant neoplasm of prostate: Secondary | ICD-10-CM | POA: Diagnosis not present

## 2018-01-17 ENCOUNTER — Ambulatory Visit
Admission: RE | Admit: 2018-01-17 | Discharge: 2018-01-17 | Disposition: A | Discharge status: Home / Self Care | Payer: Medicare HMO | Source: Ambulatory Visit | Attending: Radiation Oncology | Admitting: Radiation Oncology

## 2018-01-17 DIAGNOSIS — C61 Malignant neoplasm of prostate: Secondary | ICD-10-CM | POA: Diagnosis not present

## 2018-01-18 ENCOUNTER — Ambulatory Visit
Admission: RE | Admit: 2018-01-18 | Discharge: 2018-01-18 | Disposition: A | Discharge status: Home / Self Care | Payer: Medicare HMO | Source: Ambulatory Visit | Attending: Radiation Oncology | Admitting: Radiation Oncology

## 2018-01-18 DIAGNOSIS — C61 Malignant neoplasm of prostate: Secondary | ICD-10-CM | POA: Diagnosis not present

## 2018-01-19 ENCOUNTER — Ambulatory Visit
Admission: RE | Admit: 2018-01-19 | Discharge: 2018-01-19 | Disposition: A | Discharge status: Home / Self Care | Payer: Medicare HMO | Source: Ambulatory Visit | Attending: Radiation Oncology | Admitting: Radiation Oncology

## 2018-01-19 ENCOUNTER — Ambulatory Visit (INDEPENDENT_AMBULATORY_CARE_PROVIDER_SITE_OTHER): Payer: Medicare HMO

## 2018-01-19 DIAGNOSIS — I639 Cerebral infarction, unspecified: Secondary | ICD-10-CM

## 2018-01-19 DIAGNOSIS — C61 Malignant neoplasm of prostate: Secondary | ICD-10-CM | POA: Diagnosis not present

## 2018-01-20 ENCOUNTER — Ambulatory Visit
Admission: RE | Admit: 2018-01-20 | Discharge: 2018-01-20 | Disposition: A | Discharge status: Home / Self Care | Payer: Medicare HMO | Source: Ambulatory Visit | Attending: Radiation Oncology | Admitting: Radiation Oncology

## 2018-01-20 DIAGNOSIS — C61 Malignant neoplasm of prostate: Secondary | ICD-10-CM | POA: Diagnosis not present

## 2018-01-20 LAB — CUP PACEART REMOTE DEVICE CHECK
Date Time Interrogation Session: 20200108064306
Implantable Pulse Generator Implant Date: 20170323

## 2018-01-20 NOTE — Progress Notes (Signed)
Carelink Summary Report / Loop Recorder 

## 2018-01-21 ENCOUNTER — Ambulatory Visit
Admission: RE | Admit: 2018-01-21 | Discharge: 2018-01-21 | Disposition: A | Discharge status: Home / Self Care | Payer: Medicare HMO | Source: Ambulatory Visit | Attending: Radiation Oncology | Admitting: Radiation Oncology

## 2018-01-21 DIAGNOSIS — C61 Malignant neoplasm of prostate: Secondary | ICD-10-CM | POA: Diagnosis not present

## 2018-01-24 ENCOUNTER — Ambulatory Visit
Admission: RE | Admit: 2018-01-24 | Discharge: 2018-01-24 | Disposition: A | Discharge status: Home / Self Care | Payer: Medicare HMO | Source: Ambulatory Visit | Attending: Radiation Oncology | Admitting: Radiation Oncology

## 2018-01-24 DIAGNOSIS — C61 Malignant neoplasm of prostate: Secondary | ICD-10-CM | POA: Diagnosis not present

## 2018-01-25 ENCOUNTER — Ambulatory Visit
Admission: RE | Admit: 2018-01-25 | Discharge: 2018-01-25 | Disposition: A | Discharge status: Home / Self Care | Payer: Medicare HMO | Source: Ambulatory Visit | Attending: Radiation Oncology | Admitting: Radiation Oncology

## 2018-01-25 ENCOUNTER — Encounter: Payer: Self-pay | Admitting: Medical Oncology

## 2018-01-25 DIAGNOSIS — C61 Malignant neoplasm of prostate: Secondary | ICD-10-CM | POA: Diagnosis not present

## 2018-01-26 ENCOUNTER — Ambulatory Visit
Admission: RE | Admit: 2018-01-26 | Discharge: 2018-01-26 | Disposition: A | Discharge status: Home / Self Care | Payer: Medicare HMO | Source: Ambulatory Visit | Attending: Radiation Oncology | Admitting: Radiation Oncology

## 2018-01-26 DIAGNOSIS — C61 Malignant neoplasm of prostate: Secondary | ICD-10-CM | POA: Diagnosis not present

## 2018-01-27 ENCOUNTER — Ambulatory Visit
Admission: RE | Admit: 2018-01-27 | Discharge: 2018-01-27 | Disposition: A | Discharge status: Home / Self Care | Payer: Medicare HMO | Source: Ambulatory Visit | Attending: Radiation Oncology | Admitting: Radiation Oncology

## 2018-01-27 ENCOUNTER — Ambulatory Visit (HOSPITAL_BASED_OUTPATIENT_CLINIC_OR_DEPARTMENT_OTHER): Admit: 2018-01-27 | Payer: Medicare HMO | Admitting: Orthopedic Surgery

## 2018-01-27 ENCOUNTER — Encounter (HOSPITAL_BASED_OUTPATIENT_CLINIC_OR_DEPARTMENT_OTHER): Payer: Self-pay

## 2018-01-27 DIAGNOSIS — C61 Malignant neoplasm of prostate: Secondary | ICD-10-CM | POA: Diagnosis not present

## 2018-01-27 SURGERY — RELEASE, A1 PULLEY, FOR TRIGGER FINGER
Anesthesia: Regional | Laterality: Left

## 2018-01-28 ENCOUNTER — Ambulatory Visit
Admission: RE | Admit: 2018-01-28 | Discharge: 2018-01-28 | Disposition: A | Discharge status: Home / Self Care | Payer: Medicare HMO | Source: Ambulatory Visit | Attending: Radiation Oncology | Admitting: Radiation Oncology

## 2018-01-28 DIAGNOSIS — C61 Malignant neoplasm of prostate: Secondary | ICD-10-CM | POA: Diagnosis not present

## 2018-01-30 LAB — CUP PACEART REMOTE DEVICE CHECK
Date Time Interrogation Session: 20191206044028
Implantable Pulse Generator Implant Date: 20170323

## 2018-01-31 ENCOUNTER — Ambulatory Visit
Admission: RE | Admit: 2018-01-31 | Discharge: 2018-01-31 | Disposition: A | Discharge status: Home / Self Care | Payer: Medicare HMO | Source: Ambulatory Visit | Attending: Radiation Oncology | Admitting: Radiation Oncology

## 2018-01-31 DIAGNOSIS — C61 Malignant neoplasm of prostate: Secondary | ICD-10-CM | POA: Diagnosis not present

## 2018-02-01 ENCOUNTER — Ambulatory Visit
Admission: RE | Admit: 2018-02-01 | Discharge: 2018-02-01 | Disposition: A | Discharge status: Home / Self Care | Payer: Medicare HMO | Source: Ambulatory Visit | Attending: Radiation Oncology | Admitting: Radiation Oncology

## 2018-02-01 ENCOUNTER — Encounter: Payer: Self-pay | Admitting: Medical Oncology

## 2018-02-01 DIAGNOSIS — C61 Malignant neoplasm of prostate: Secondary | ICD-10-CM | POA: Diagnosis not present

## 2018-02-04 ENCOUNTER — Encounter: Payer: Self-pay | Admitting: Radiation Oncology

## 2018-02-04 NOTE — Progress Notes (Signed)
  Radiation Oncology         [phone]) [phone] ________________________________  Name: Joshua [name] MRN: [id number]  Date: 02/04/2018  DOB: April 12, 1945  End of Treatment Note  Diagnosis:  73 y.o. gentleman with Stage T2a adenocarcinoma of the prostate with Gleason score of 3+4, and PSA of 1.8 (3.6 adjusted on finasteride).     Indication for treatment:  Curative, Definitive Radiotherapy       Radiation treatment dates:   12/22/2017 - 02/01/2018  Site/dose:   The prostate was treated to 70 Gy in 28 fractions of 2.5 Gy  Beams/energy:   The patient was treated with IMRT using volumetric arc therapy delivering 6 MV X-rays to clockwise and counterclockwise circumferential arcs with a 90 degree collimator offset to avoid dose scalloping.  Image guidance was performed with daily cone beam CT prior to each fraction to align to gold markers in the prostate and assure proper bladder and rectal fill volumes.  Immobilization was achieved with Body[employer] custom mold.  Narrative: The patient tolerated radiation treatment relatively well. He experienced some minor urinary irritation and modest fatigue with nocturia x1-3 and incomplete emptying of his bladder throughout treatment. He reported increases in dysuria but was unable to take AZO due to being placed on Plavix. He denied urinary urgency and leakage towards the beginning of treatments, but he began to experience more urgency, with occasional leakage, from the third week on. He reported some loose stools but denied diarrhea. He also experienced some weakening in his flow of stream and occasional intermittency.  Plan: The patient has completed radiation treatment. He will return to radiation oncology clinic for routine followup in one month. I advised him to call or return sooner if he has any questions or concerns related to his recovery or treatment. ________________________________  Sheral Apley. Tammi Klippel, M.D.  This document serves as a record of  services personally performed by Tyler Pita, MD. It was created on his behalf by Wilburn Mylar, a trained medical scribe. The creation of this record is based on the scribe's personal observations and the provider's statements to them. This document has been checked and approved by the attending provider.

## 2018-02-17 DIAGNOSIS — M4716 Other spondylosis with myelopathy, lumbar region: Secondary | ICD-10-CM | POA: Diagnosis not present

## 2018-02-17 NOTE — Progress Notes (Signed)
Completed radiation to the prostate today and will follow up with [facility] 03/09/18 at 2:00 pm. He was encouraged to call with questions or concerns.

## 2018-02-21 ENCOUNTER — Ambulatory Visit (INDEPENDENT_AMBULATORY_CARE_PROVIDER_SITE_OTHER): Payer: Medicare HMO

## 2018-02-21 DIAGNOSIS — I639 Cerebral infarction, unspecified: Secondary | ICD-10-CM | POA: Diagnosis not present

## 2018-02-21 LAB — CUP PACEART REMOTE DEVICE CHECK
Date Time Interrogation Session: 20200210104119
Implantable Pulse Generator Implant Date: 20170323

## 2018-02-24 DIAGNOSIS — N402 Nodular prostate without lower urinary tract symptoms: Secondary | ICD-10-CM | POA: Diagnosis not present

## 2018-02-24 DIAGNOSIS — C61 Malignant neoplasm of prostate: Secondary | ICD-10-CM | POA: Diagnosis not present

## 2018-02-24 DIAGNOSIS — N3941 Urge incontinence: Secondary | ICD-10-CM | POA: Diagnosis not present

## 2018-03-03 NOTE — Progress Notes (Signed)
Carelink Summary Report / Loop Recorder 

## 2018-03-04 DIAGNOSIS — M6289 Other specified disorders of muscle: Secondary | ICD-10-CM | POA: Diagnosis not present

## 2018-03-04 DIAGNOSIS — R350 Frequency of micturition: Secondary | ICD-10-CM | POA: Diagnosis not present

## 2018-03-04 DIAGNOSIS — K5900 Constipation, unspecified: Secondary | ICD-10-CM | POA: Diagnosis not present

## 2018-03-04 DIAGNOSIS — R3915 Urgency of urination: Secondary | ICD-10-CM | POA: Diagnosis not present

## 2018-03-04 DIAGNOSIS — N3941 Urge incontinence: Secondary | ICD-10-CM | POA: Diagnosis not present

## 2018-03-04 DIAGNOSIS — M6281 Muscle weakness (generalized): Secondary | ICD-10-CM | POA: Diagnosis not present

## 2018-03-07 DIAGNOSIS — R3915 Urgency of urination: Secondary | ICD-10-CM | POA: Diagnosis not present

## 2018-03-07 DIAGNOSIS — M6289 Other specified disorders of muscle: Secondary | ICD-10-CM | POA: Diagnosis not present

## 2018-03-07 DIAGNOSIS — M6281 Muscle weakness (generalized): Secondary | ICD-10-CM | POA: Diagnosis not present

## 2018-03-07 DIAGNOSIS — R350 Frequency of micturition: Secondary | ICD-10-CM | POA: Diagnosis not present

## 2018-03-07 DIAGNOSIS — N3941 Urge incontinence: Secondary | ICD-10-CM | POA: Diagnosis not present

## 2018-03-07 DIAGNOSIS — K5900 Constipation, unspecified: Secondary | ICD-10-CM | POA: Diagnosis not present

## 2018-03-09 ENCOUNTER — Other Ambulatory Visit: Payer: Self-pay

## 2018-03-09 ENCOUNTER — Encounter: Payer: Self-pay | Admitting: Urology

## 2018-03-09 ENCOUNTER — Ambulatory Visit
Admission: RE | Admit: 2018-03-09 | Discharge: 2018-03-09 | Disposition: A | Discharge status: Home / Self Care | Payer: Medicare HMO | Source: Ambulatory Visit | Attending: Urology | Admitting: Urology

## 2018-03-09 VITALS — BP 130/82 | HR 72 | Temp 98.1°F | Resp 20 | Ht 73.0 in | Wt 234.4 lb

## 2018-03-09 DIAGNOSIS — C61 Malignant neoplasm of prostate: Secondary | ICD-10-CM | POA: Diagnosis not present

## 2018-03-09 DIAGNOSIS — N3943 Post-void dribbling: Secondary | ICD-10-CM | POA: Diagnosis not present

## 2018-03-09 DIAGNOSIS — R5383 Other fatigue: Secondary | ICD-10-CM | POA: Diagnosis not present

## 2018-03-09 DIAGNOSIS — Z79899 Other long term (current) drug therapy: Secondary | ICD-10-CM | POA: Diagnosis not present

## 2018-03-09 DIAGNOSIS — R351 Nocturia: Secondary | ICD-10-CM | POA: Insufficient documentation

## 2018-03-09 DIAGNOSIS — N3941 Urge incontinence: Secondary | ICD-10-CM | POA: Insufficient documentation

## 2018-03-09 NOTE — Progress Notes (Signed)
Radiation Oncology         [phone] ________________________________  Name: Joshua [name] MRN: [id number]  Date: 03/09/2018  DOB: 19-Dec-1945  Post Treatment Note  CC: Marton Redwood, MD  Lucas Mallow, MD  Diagnosis:   73 y.o. gentleman with Stage T2a adenocarcinoma of the prostate with Gleason score of 3+4, and PSA of 1.8 (3.6 adjusted on finasteride).     Interval Since Last Radiation:  5 weeks  12/22/2017 - 02/01/2018: The prostate was treated to 70 Gy in 28 fractions of 2.5 Gy  Narrative:  The patient returns today for routine follow-up. He tolerated radiation treatment relatively well. He experienced some minor urinary irritation and modest fatigue with nocturia x1-3 and incomplete emptying of his bladder throughout treatment. He reported increased dysuria but was unable to take AZO due to being placed on Plavix. He experienced more urgency, with occasional leakage, from the third week of treatment and beyond. He reported some loose stools but denied diarrhea. He also experienced some weakening in his flow of stream with occasional intermittency.                              On review of systems, the patient states that he is doing relatively well overall.  He continues with moderate LUTS with a current IPSS of 16.  He reports persistent urgency, daytime frequency, weak stream, intermittency and feelings of incomplete bladder emptying.  He has episodes of urge incontinence as well as post void dribble.  He was recently seen with Dr. Gloriann Loan on 02/24/2018 and started on a trial of Myrbetriq 25 mg p.o. daily.  He tolerates this medication well and reports that he has seen some mild improvement in his LUTS since starting this medication.  He has also continued taking finasteride and Flomax daily.  He denies abdominal pain, nausea, vomiting, diarrhea or constipation.  He reports a healthy appetite and is maintaining his weight.  Overall, he is pleased with his progress to  date.  ALLERGIES:  is allergic to floxin i.v. in dextrose 5% [ofloxacin] and terazosin.  Meds: Current Outpatient Medications  Medication Sig Dispense Refill  . albuterol (PROVENTIL HFA;VENTOLIN HFA) 108 (90 Base) MCG/ACT inhaler Inhale 2 puffs into the lungs every 4 (four) hours as needed for wheezing. 1 Inhaler 0  . benzonatate (TESSALON) 200 MG capsule Take 1 capsule (200 mg total) by mouth 3 (three) times daily as needed for cough. 20 capsule 0  . carbidopa-levodopa (SINEMET IR) 25-100 MG tablet Take 1 tablet by mouth 3 (three) times daily. 270 tablet 1  . clopidogrel (PLAVIX) 75 MG tablet Take 1 tablet (75 mg total) by mouth daily. 30 tablet 2  . DULoxetine (CYMBALTA) 60 MG capsule Take by mouth daily.     [name] DYMISTA 137-50 MCG/ACT SUSP Place 1 puff into both nostrils at bedtime.     . finasteride (PROSCAR) 5 MG tablet Take 5 mg by mouth Daily.    . flecainide (TAMBOCOR) 150 MG tablet TAKE 1/2 TABLET BY MOUTH EVERY 12 HOURS 90 tablet 2  . lansoprazole (PREVACID) 30 MG capsule Take 1 capsule (30 mg total) by mouth 2 (two) times daily. 60 capsule 6  . levocetirizine (XYZAL) 5 MG tablet Take 5 mg by mouth every evening.      . methocarbamol (ROBAXIN) 500 MG tablet TAKE 1 TABLET (500 MG TOTAL) BY MOUTH FOUR (4) TIMES A DAY.  0  . montelukast (SINGULAIR)  10 MG tablet Take 10 mg by mouth at bedtime.      . pramipexole (MIRAPEX) 0.5 MG tablet Take 1 tablet (0.5 mg total) by mouth 3 (three) times daily. 270 tablet 1  . PROCTOSOL HC 2.5 % rectal cream APPLY INTO AND AROUND RECTUM 2 TIMES A DAY 28.35 g 0  . rosuvastatin (CRESTOR) 10 MG tablet Take 5 mg by mouth daily. Take 1/2 tablet daily    . tamsulosin (FLOMAX) 0.4 MG CAPS capsule Take 0.4 mg by mouth daily after supper.     . traMADol (ULTRAM) 50 MG tablet Take 50 mg by mouth every 6 (six) hours as needed. for pain  0  . zolpidem (AMBIEN) 10 MG tablet Take 10 mg by mouth at bedtime.     No current facility-administered medications for this  encounter.     Physical Findings:  height is 6\' 1"  (1.854 m) and weight is 234 lb 6.4 oz (106.3 kg). His oral temperature is 98.1 F (36.7 C). His blood pressure is 130/82 and his pulse is 72. His respiration is 20 and oxygen saturation is 96%.  Pain Assessment Pain Score: 6  Pain Loc: Back/10 In general this is a well appearing Caucasian male in no acute distress.  He's alert and oriented x4 and appropriate throughout the examination. Cardiopulmonary assessment is negative for acute distress and he exhibits normal effort.   Lab Findings: Lab Results  Component Value Date   WBC 8.4 09/18/2015   HGB 14.8 09/18/2015   HCT 42.1 09/18/2015   MCV 89.4 09/18/2015   PLT 200 09/18/2015     Radiographic Findings: No results found.  Impression/Plan: 1. 73 y.o. gentleman with Stage T2a adenocarcinoma of the prostate with Gleason score of 3+4, and PSA of 1.8 (3.6 adjusted on finasteride).   At his recent follow-up visit with Dr. Gloriann Loan on 02/24/2018, his PSA was noted to be decreased at 0.74 (1.4 adjusted for finasteride) and he was provided samples and a prescription for Myrbetriq 25 mg p.o. daily.  He will continue to follow up with urology for ongoing PSA determinations and has an appointment scheduled with Dr. Gloriann Loan in May or June 2020. He understands what to expect with regards to PSA monitoring going forward.  We also discussed increasing his dose of Myrbetriq to 50 mg p.o. daily to see if this provides better improvement in his LUTS.  I will look forward to following his response to treatment via correspondence with urology, and would be happy to continue to participate in his care if clinically indicated. I talked to the patient about what to expect in the future, including his risk for erectile dysfunction and rectal bleeding. I encouraged him to call or return to the office if he has any questions regarding his previous radiation or possible radiation side effects. He was comfortable with this  plan and will follow up as needed.    Nicholos Johns, PA-C

## 2018-03-17 DIAGNOSIS — K5900 Constipation, unspecified: Secondary | ICD-10-CM | POA: Diagnosis not present

## 2018-03-17 DIAGNOSIS — M6281 Muscle weakness (generalized): Secondary | ICD-10-CM | POA: Diagnosis not present

## 2018-03-17 DIAGNOSIS — N3941 Urge incontinence: Secondary | ICD-10-CM | POA: Diagnosis not present

## 2018-03-17 DIAGNOSIS — R3915 Urgency of urination: Secondary | ICD-10-CM | POA: Diagnosis not present

## 2018-03-17 DIAGNOSIS — R350 Frequency of micturition: Secondary | ICD-10-CM | POA: Diagnosis not present

## 2018-03-17 DIAGNOSIS — M6289 Other specified disorders of muscle: Secondary | ICD-10-CM | POA: Diagnosis not present

## 2018-03-26 LAB — CUP PACEART REMOTE DEVICE CHECK
Date Time Interrogation Session: 20200314103735
Implantable Pulse Generator Implant Date: 20170323

## 2018-03-28 ENCOUNTER — Ambulatory Visit (INDEPENDENT_AMBULATORY_CARE_PROVIDER_SITE_OTHER): Payer: Medicare HMO | Admitting: *Deleted

## 2018-03-28 ENCOUNTER — Other Ambulatory Visit: Payer: Self-pay

## 2018-03-28 DIAGNOSIS — I639 Cerebral infarction, unspecified: Secondary | ICD-10-CM | POA: Diagnosis not present

## 2018-04-01 ENCOUNTER — Telehealth: Payer: Self-pay | Admitting: Neurology

## 2018-04-01 NOTE — Telephone Encounter (Signed)
Spoke with patient and verified that they do have a video compatible device. Best number to reach them is [phone]. Email: [email or web address]

## 2018-04-01 NOTE — Telephone Encounter (Signed)
Okay to schedule e-visit as you are with the other patients.

## 2018-04-01 NOTE — Telephone Encounter (Signed)
I called the patient and he wants to do an E-Visit with Dr. Carles Collet. He is aware there could be a copay. Please call him to set this up. [phone] is the best call back number. Would you like me to cancel May appt? Thanks!

## 2018-04-04 ENCOUNTER — Ambulatory Visit: Payer: Medicare HMO | Admitting: Neurology

## 2018-04-04 NOTE — Progress Notes (Signed)
Carelink Summary Report / Loop Recorder 

## 2018-04-05 ENCOUNTER — Telehealth: Payer: Self-pay | Admitting: Neurology

## 2018-04-05 NOTE — Telephone Encounter (Signed)
Left message on machine for patient to call back.  To go over medications prior to virtual visit. Awaiting call back.

## 2018-04-05 NOTE — Progress Notes (Signed)
Virtual Visit via Video Note The purpose of this virtual visit is to provide medical care while limiting exposure to the novel coronavirus.    Consent was obtained for video visit:  Yes.   Answered questions that patient had about telehealth interaction:  Yes.   I discussed the limitations, risks, security and privacy concerns of performing an evaluation and management service by telemedicine. I also discussed with the patient that there may be a patient responsible charge related to this service. The patient expressed understanding and agreed to proceed.  Pt location: Home Physician Location: office Name of referring provider:  Marton Redwood, MD I connected with Ethelene Browns at patients initiation/request on 04/06/2018 at  9:00 AM EDT by video enabled telemedicine application and verified that I am speaking with the correct person using two identifiers. Pt MRN:  [id number] Pt DOB:  [birth year]   History of Present Illness: Pt seen in f/u today for parkinsonism.  I didn't think that he was on PD meds but states that he went back on carbidopa/levodopa 25/100 tid.  Tremor has gotten worse.  Balance is worse.   Pt had 1 fall standing up taking off shoes and got feet tangled up.  Getting fatigued earlier than usual.   Leaning forward with ambulation and thinks would like to try a back brace.  Pt denies lightheadedness, near syncope.  No hallucinations.  Mood has been good.  The records that were made available to me were reviewed since last visit.  Seeing Dr. Carloyn Manner for lumbar radiculopathy but states that Dr. Carloyn Manner is retiring.  [facility] to [facility] (until [facility] with [facility]).  Has undergone radiation tx for prostate CA. No diplopia but some visual distortions.    Observations/Objective:    Vitals:   04/06/18 0913  Pulse: 93  Weight: 231 lb (104.8 kg)  Height: 6\' 1"  (1.854 m)      GEN:  The patient appears stated age and is in NAD.  Neurological examination:   Orientation: The patient is alert and oriented x3. Cranial nerves: There is good facial symmetry. There is mild facial hypomimia.  EOMI.  The speech is fluent and clear. Soft palate rises symmetrically and there is no tongue deviation. Hearing is intact to conversational tone. Motor: Strength is at least antigravity x 4.   Shoulder shrug is equal and symmetric.  There is no pronator drift.  Movement examination: Tone: unable Abnormal movements: There is RUE rest tremor>LUE resting tremor Coordination:  There is no decremation with RAM's, with any form of RAMS, including alternating supination and pronation of the forearm, hand opening and closing, finger taps.  Difficult to see feet for toe and heel taps. Gait and Station: The patient has no difficulty arising out of a deep-seated chair without the use of the hands. The patient is more short stepped.  He has bent at the waist.  He has decreased arm swing on the right.  He has tremor of the right upper extremity.    Assessment and Plan:   1.  Parkinsonism, likely idiopathic Parkinson's disease, now that I have seen him for quite some time             - ET/PD is in the ddx.  I have suspected a possible atypical state, but have not really seen that progress.  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              -DaT scan positive for loss  of DA in bilateral putamen in 08/2016.  I showed the patient and his wife the films and reiterated that this was not a diagnostic test.   -He has had a levodopa challenge test in the past that did not show a significant benefit to levodopa, but his primary issue at that time was tremor.  He has since developed slowness of movement.  He put himself back on levodopa and is currently on carbidopa/levodopa 25/100, 1 tablet 3 times per day.  I told him to move the dosages before the mealtimes.  -Tremor has become increasingly more bothersome and interfering with activities of daily living.  We talked about the  risks and benefits of trihexyphenidyl, including anticholinergic side effects.  Discussed risks that include but are not limited to dry mouth, hallucinations, confusion, loss of balance.  He would like to try it.  We will try very low-dose, 2 mg once per day.  This may not be enough, but he can certainly let me know if he has any side effects.  2.  Cerebral infarct (watershed territory) 02/2015             - Now has loop recorder.  TEE with PFO.  No indication to close given age and RoPE score of 5.   Carotid u/s normal.  LDL 75 at time of event. On plavix.  He asked me about changing to ASA but he had stroke on this so would recommend staying with the Plavix.    3.  Syncope  -None since last visit and no lightheadedness.             -as above, multiple episodes.    Now off of propranolol and Flomax. Cardiology did not think that his propranolol was contributing.  His LINQ monitor has not shown any reasons for syncope.  However, this certainly would not show orthostasis.   Follow Up Instructions:    -I discussed the assessment and treatment plan with the patient. The patient was provided an opportunity to ask questions and all were answered. The patient agreed with the plan and demonstrated an understanding of the instructions.   The patient was advised to call back or seek an in-person evaluation if the symptoms worsen or if the condition fails to improve as anticipated.    Total Time spent in visit with the patient was:  25, of which more than 50% of the time was spent in counseling and/or coordinating care on safety with PD.   Pt understands and agrees with the plan of care outlined.     Alonza Bogus, DO

## 2018-04-06 ENCOUNTER — Other Ambulatory Visit: Payer: Self-pay

## 2018-04-06 ENCOUNTER — Encounter: Payer: Self-pay | Admitting: Neurology

## 2018-04-06 ENCOUNTER — Telehealth (INDEPENDENT_AMBULATORY_CARE_PROVIDER_SITE_OTHER): Payer: Medicare HMO | Admitting: Neurology

## 2018-04-06 DIAGNOSIS — Z7902 Long term (current) use of antithrombotics/antiplatelets: Secondary | ICD-10-CM

## 2018-04-06 DIAGNOSIS — R55 Syncope and collapse: Secondary | ICD-10-CM

## 2018-04-06 DIAGNOSIS — Z8673 Personal history of transient ischemic attack (TIA), and cerebral infarction without residual deficits: Secondary | ICD-10-CM | POA: Diagnosis not present

## 2018-04-06 DIAGNOSIS — Q211 Atrial septal defect: Secondary | ICD-10-CM

## 2018-04-06 DIAGNOSIS — G20 Parkinson's disease: Secondary | ICD-10-CM | POA: Diagnosis not present

## 2018-04-06 MED ORDER — TRIHEXYPHENIDYL HCL 2 MG PO TABS: 2.0000 mg | ORAL_TABLET | ORAL | 1 refills | Status: DC

## 2018-04-06 MED ORDER — CARBIDOPA-LEVODOPA 25-100 MG PO TABS: 1.0000 | ORAL_TABLET | Freq: Three times a day (TID) | ORAL | 1 refills | Status: DC

## 2018-04-08 DIAGNOSIS — R531 Weakness: Secondary | ICD-10-CM

## 2018-04-08 DIAGNOSIS — R55 Syncope and collapse: Secondary | ICD-10-CM

## 2018-04-08 NOTE — Telephone Encounter (Signed)
From Dr. Carles Collet: Go ahead an order MRI brain (you need to pull down the "today" tab when you order to indicate urgent but not stat). Dx: syncope and L sided weakness.  MR ordered.

## 2018-04-14 ENCOUNTER — Telehealth: Payer: Self-pay | Admitting: *Deleted

## 2018-04-14 ENCOUNTER — Other Ambulatory Visit: Payer: Self-pay | Admitting: Neurology

## 2018-04-14 ENCOUNTER — Other Ambulatory Visit: Payer: Self-pay | Admitting: *Deleted

## 2018-04-14 MED ORDER — DIAZEPAM 5 MG PO TABS: ORAL_TABLET | ORAL | 0 refills | Status: DC

## 2018-04-14 NOTE — Telephone Encounter (Signed)
Called pt to follow up about LINQ monitor. Pt voiced interest in explanting this year d/t device end of battery life. Pt understands that under the current COVID-19 pandemic that explantation will have to be postponed until pandemic has passed. Pt understands I will follow back up at a safer time to schedule ILR explant.  He is also interested in stopping remote monitoring. Aware that I will forward to device clinic to end monthly remote checks.

## 2018-04-14 NOTE — Telephone Encounter (Signed)
Unenrolled from [place], return kit ordered to home address on file.

## 2018-04-20 ENCOUNTER — Other Ambulatory Visit: Payer: Self-pay

## 2018-04-20 ENCOUNTER — Ambulatory Visit
Admission: RE | Admit: 2018-04-20 | Discharge: 2018-04-20 | Disposition: A | Discharge status: Home / Self Care | Payer: Medicare HMO | Source: Ambulatory Visit | Attending: Neurology | Admitting: Neurology

## 2018-04-20 DIAGNOSIS — R55 Syncope and collapse: Secondary | ICD-10-CM

## 2018-04-20 DIAGNOSIS — R531 Weakness: Secondary | ICD-10-CM

## 2018-04-21 ENCOUNTER — Telehealth: Payer: Self-pay | Admitting: *Deleted

## 2018-04-21 ENCOUNTER — Other Ambulatory Visit: Payer: Self-pay | Admitting: *Deleted

## 2018-04-21 DIAGNOSIS — R55 Syncope and collapse: Secondary | ICD-10-CM

## 2018-04-21 DIAGNOSIS — R531 Weakness: Secondary | ICD-10-CM

## 2018-04-21 NOTE — Telephone Encounter (Signed)
Patient given results and order placed for [place].

## 2018-04-21 NOTE — Telephone Encounter (Signed)
-----   Message from [place], DO sent at 04/20/2018  4:56 PM EDT ----- I reviewed scan personally.  I didn't think a lot of progression of WMD since prior scan.  DWI negative.  Caryl Pina, please let pt know that no evidence of new stroke on scan.  Just to be cover all bases, go ahead and order carotid US (dx: L sided weakness).

## 2018-05-02 ENCOUNTER — Telehealth: Payer: Self-pay | Admitting: Neurology

## 2018-05-02 DIAGNOSIS — M5416 Radiculopathy, lumbar region: Secondary | ICD-10-CM | POA: Diagnosis not present

## 2018-05-02 DIAGNOSIS — M461 Sacroiliitis, not elsewhere classified: Secondary | ICD-10-CM | POA: Diagnosis not present

## 2018-05-02 DIAGNOSIS — M961 Postlaminectomy syndrome, not elsewhere classified: Secondary | ICD-10-CM | POA: Diagnosis not present

## 2018-05-02 DIAGNOSIS — Z79899 Other long term (current) drug therapy: Secondary | ICD-10-CM | POA: Diagnosis not present

## 2018-05-02 DIAGNOSIS — R69 Illness, unspecified: Secondary | ICD-10-CM | POA: Diagnosis not present

## 2018-05-02 DIAGNOSIS — I4891 Unspecified atrial fibrillation: Secondary | ICD-10-CM | POA: Diagnosis not present

## 2018-05-02 NOTE — Telephone Encounter (Signed)
Patient called regarding his last visit with Dr. Carles Collet and her starting him on a new medication and the pharmacy said they will not fill it without a Prior Auth. Please Call. Thanks

## 2018-05-03 NOTE — Telephone Encounter (Signed)
Will you check on this?  

## 2018-05-04 ENCOUNTER — Telehealth: Payer: Self-pay | Admitting: *Deleted

## 2018-05-04 ENCOUNTER — Encounter: Payer: Self-pay | Admitting: *Deleted

## 2018-05-04 NOTE — Telephone Encounter (Signed)
Attempted to contact patient to let him know that I have started working on this.  His phone rang a few times and just stopped.  Will try again.

## 2018-05-04 NOTE — Telephone Encounter (Signed)
Sent message via My Chart that medication has been approved. 02/03/2018 until 05/04/2019.

## 2018-05-04 NOTE — Telephone Encounter (Signed)
Medication (trihexyphenidyl) has been approved from 02/03/2018 until 05/04/2019.

## 2018-05-10 DIAGNOSIS — M461 Sacroiliitis, not elsewhere classified: Secondary | ICD-10-CM | POA: Diagnosis not present

## 2018-05-18 DIAGNOSIS — L814 Other melanin hyperpigmentation: Secondary | ICD-10-CM | POA: Diagnosis not present

## 2018-05-18 DIAGNOSIS — D2262 Melanocytic nevi of left upper limb, including shoulder: Secondary | ICD-10-CM | POA: Diagnosis not present

## 2018-05-18 DIAGNOSIS — L550 Sunburn of first degree: Secondary | ICD-10-CM | POA: Diagnosis not present

## 2018-05-18 DIAGNOSIS — L821 Other seborrheic keratosis: Secondary | ICD-10-CM | POA: Diagnosis not present

## 2018-05-18 DIAGNOSIS — D2261 Melanocytic nevi of right upper limb, including shoulder: Secondary | ICD-10-CM | POA: Diagnosis not present

## 2018-05-18 DIAGNOSIS — L570 Actinic keratosis: Secondary | ICD-10-CM | POA: Diagnosis not present

## 2018-05-24 ENCOUNTER — Ambulatory Visit: Payer: Medicare HMO | Admitting: Neurology

## 2018-05-24 NOTE — Telephone Encounter (Signed)
Joshua [name] Key: [id number] - PA Case ID: [id number] Need help? Call us at [phone]  Outcome  Approvedon April 22  Your request has been approved  DrugTrihexyphenidyl HCl 2MG  tablets  [employer] Electronic PA Form

## 2018-05-26 DIAGNOSIS — C61 Malignant neoplasm of prostate: Secondary | ICD-10-CM | POA: Diagnosis not present

## 2018-05-31 DIAGNOSIS — M461 Sacroiliitis, not elsewhere classified: Secondary | ICD-10-CM | POA: Diagnosis not present

## 2018-05-31 DIAGNOSIS — R69 Illness, unspecified: Secondary | ICD-10-CM | POA: Diagnosis not present

## 2018-05-31 DIAGNOSIS — M961 Postlaminectomy syndrome, not elsewhere classified: Secondary | ICD-10-CM | POA: Diagnosis not present

## 2018-06-03 DIAGNOSIS — C61 Malignant neoplasm of prostate: Secondary | ICD-10-CM | POA: Diagnosis not present

## 2018-06-03 DIAGNOSIS — R351 Nocturia: Secondary | ICD-10-CM | POA: Diagnosis not present

## 2018-06-03 DIAGNOSIS — N5201 Erectile dysfunction due to arterial insufficiency: Secondary | ICD-10-CM | POA: Diagnosis not present

## 2018-06-28 DIAGNOSIS — I10 Essential (primary) hypertension: Secondary | ICD-10-CM | POA: Diagnosis not present

## 2018-06-28 DIAGNOSIS — R7301 Impaired fasting glucose: Secondary | ICD-10-CM | POA: Diagnosis not present

## 2018-06-28 DIAGNOSIS — E7849 Other hyperlipidemia: Secondary | ICD-10-CM | POA: Diagnosis not present

## 2018-06-28 DIAGNOSIS — Z125 Encounter for screening for malignant neoplasm of prostate: Secondary | ICD-10-CM | POA: Diagnosis not present

## 2018-07-05 DIAGNOSIS — R7301 Impaired fasting glucose: Secondary | ICD-10-CM | POA: Diagnosis not present

## 2018-07-05 DIAGNOSIS — I10 Essential (primary) hypertension: Secondary | ICD-10-CM | POA: Diagnosis not present

## 2018-07-05 DIAGNOSIS — E785 Hyperlipidemia, unspecified: Secondary | ICD-10-CM | POA: Diagnosis not present

## 2018-07-05 DIAGNOSIS — J45909 Unspecified asthma, uncomplicated: Secondary | ICD-10-CM | POA: Diagnosis not present

## 2018-07-05 DIAGNOSIS — Z0000 Encounter for general adult medical examination without abnormal findings: Secondary | ICD-10-CM | POA: Diagnosis not present

## 2018-07-05 DIAGNOSIS — Z1339 Encounter for screening examination for other mental health and behavioral disorders: Secondary | ICD-10-CM | POA: Diagnosis not present

## 2018-07-05 DIAGNOSIS — R69 Illness, unspecified: Secondary | ICD-10-CM | POA: Diagnosis not present

## 2018-07-05 DIAGNOSIS — Z8546 Personal history of malignant neoplasm of prostate: Secondary | ICD-10-CM | POA: Diagnosis not present

## 2018-07-05 DIAGNOSIS — I69954 Hemiplegia and hemiparesis following unspecified cerebrovascular disease affecting left non-dominant side: Secondary | ICD-10-CM | POA: Diagnosis not present

## 2018-07-05 DIAGNOSIS — Z1331 Encounter for screening for depression: Secondary | ICD-10-CM | POA: Diagnosis not present

## 2018-07-05 DIAGNOSIS — K219 Gastro-esophageal reflux disease without esophagitis: Secondary | ICD-10-CM | POA: Diagnosis not present

## 2018-07-05 DIAGNOSIS — G20 Parkinson's disease: Secondary | ICD-10-CM | POA: Diagnosis not present

## 2018-07-20 ENCOUNTER — Telehealth: Payer: Self-pay | Admitting: Neurology

## 2018-07-20 DIAGNOSIS — G20A1 Parkinson's disease without dyskinesia, without mention of fluctuations: Secondary | ICD-10-CM

## 2018-07-20 DIAGNOSIS — G20 Parkinson's disease: Secondary | ICD-10-CM

## 2018-07-20 NOTE — Telephone Encounter (Signed)
Yes, but I don't think that [facility] does PT (only exercise for PD as far as I know)

## 2018-07-20 NOTE — Telephone Encounter (Addendum)
Pt is currently at [facility] by Dedra Skeens for PT  [place], [place] [address] [phone]  Reason:PT Dx. PD  Pt says they are requesting order to continue physical therapy   Ok to send?

## 2018-07-20 NOTE — Telephone Encounter (Signed)
Patient left vm about having PT questions. He is wanting to continue PT and is needing a letter that states it is okay from Dr. Carles Collet to continue the therapy. Thanks!

## 2018-07-20 NOTE — Telephone Encounter (Signed)
Called [facility] to get additional information to fax over No answer, unable to leave voice mail reason mail box full Will try back later  No fax number on website

## 2018-07-20 NOTE — Telephone Encounter (Signed)
Called [facility] again today again no answer unable to leave message mail box full

## 2018-07-21 NOTE — Telephone Encounter (Signed)
Called spoke with patient informed him that know one at the [facility] facility is answering the phone. He request that we mail order to him and he will deliver it to the program.  Pt has personal trainer for 30 mins 2 times a week for stregnthing exercise and balance training on Tuesday and Thursday for PD.  Order placed sent out via mail today.

## 2018-08-26 DIAGNOSIS — C61 Malignant neoplasm of prostate: Secondary | ICD-10-CM | POA: Diagnosis not present

## 2018-08-30 DIAGNOSIS — J3089 Other allergic rhinitis: Secondary | ICD-10-CM | POA: Diagnosis not present

## 2018-08-30 DIAGNOSIS — H1045 Other chronic allergic conjunctivitis: Secondary | ICD-10-CM | POA: Diagnosis not present

## 2018-08-30 DIAGNOSIS — J4520 Mild intermittent asthma, uncomplicated: Secondary | ICD-10-CM | POA: Diagnosis not present

## 2018-09-01 DIAGNOSIS — C61 Malignant neoplasm of prostate: Secondary | ICD-10-CM | POA: Diagnosis not present

## 2018-09-01 DIAGNOSIS — R351 Nocturia: Secondary | ICD-10-CM | POA: Diagnosis not present

## 2018-09-01 NOTE — Progress Notes (Signed)
Virtual Visit via Video Note The purpose of this virtual visit is to provide medical care while limiting exposure to the novel coronavirus.    Consent was obtained for video visit:  Yes.   Answered questions that patient had about telehealth interaction:  Yes.   I discussed the limitations, risks, security and privacy concerns of performing an evaluation and management service by telemedicine. I also discussed with the patient that there may be a patient responsible charge related to this service. The patient expressed understanding and agreed to proceed.  Pt location: Home Physician Location: office Name of referring provider:  Marton Redwood, MD I connected with Ethelene Browns at patients initiation/request on 09/02/2018 at  3:30 PM EDT by video enabled telemedicine application and verified that I am speaking with the correct person using two identifiers. Pt MRN:  [id number] Pt DOB:  1945/08/22   History of Present Illness: Pt seen in f/u today for parkinsonism.  Wife supplements history of very end of visit.  Patient is on carbidopa/levodopa 25/100, 1 tablet 3 times per day.  He was really complaining a lot about tremor last visit, so we cautiously started low-dose Artane, 2 mg daily.  The same day that we had our telemedicine visit last visit (had not started artane), he told me that (following the visit) he was sitting in his bedroom and that is the last thing he was remembering.  He ended up having a syncopal episode (interestingly today, he describes the episode and I am not sure that it is a syncopal episode.  He states that he went back in his chair and went to sleep, and it sounds like he was somewhat disoriented when his wife woke him up and they thought perhaps it was a syncopal episode).  He felt that he was weak on the left side upon his wife awakening him.  He was very sleepy after the episode.  MRI of the brain was ordered and personally reviewed.  It was nonacute.  I did order  a carotid ultrasound but it does not appear it was ever completed.  Patient states that they told him they would call him once COVID restrictions were lifted, but he never got the call.  He did not follow-up with cardiology after his syncopal episode.  In regards to his tremor, he does not think that the Artane has helped it, but he does think it has caused more loss of balance.  He is noticing more tremor in the left hand side as well.  He has had 2 falls, one in which he hit his head on the nightstand about a month ago.  There was no loss or alteration of consciousness.  He is having more trouble in the turns.  He is [facility].   Current Outpatient Medications:  .  albuterol (PROVENTIL HFA;VENTOLIN HFA) 108 (90 Base) MCG/ACT inhaler, Inhale 2 puffs into the lungs every 4 (four) hours as needed for wheezing., Disp: 1 Inhaler, Rfl: 0 .  carbidopa-levodopa (SINEMET IR) 25-100 MG tablet, Take 1 tablet by mouth 3 (three) times daily., Disp: 270 tablet, Rfl: 1 .  clopidogrel (PLAVIX) 75 MG tablet, Take 1 tablet (75 mg total) by mouth daily., Disp: 30 tablet, Rfl: 2 .  diazepam (VALIUM) 5 MG tablet, 1 tablet 45 minutes prior to procedure, 1 tablet 15 minutes before procedure and may take 1 extra tablet if needed., Disp: 3 tablet, Rfl: 0 .  DYMISTA 137-50 MCG/ACT SUSP, Place 1 puff  into both nostrils at bedtime. , Disp: , Rfl:  .  escitalopram (LEXAPRO) 10 MG tablet, TAKE 1 TABLET BY MOUTH EVERY DAY (TO REPLACE DULOXETINE), Disp: , Rfl:  .  finasteride (PROSCAR) 5 MG tablet, Take 5 mg by mouth Daily., Disp: , Rfl:  .  flecainide (TAMBOCOR) 150 MG tablet, TAKE 1/2 TABLET BY MOUTH EVERY 12 HOURS, Disp: 90 tablet, Rfl: 2 .  gabapentin (NEURONTIN) 100 MG capsule, TAKE 1 CAPSULE BY MOUTH EVERY 8 HOURS AS NEEDED FOR PAIN, Disp: , Rfl:  .  levocetirizine (XYZAL) 5 MG tablet, Take 5 mg by mouth every evening.  , Disp: , Rfl:  .  methocarbamol (ROBAXIN) 500 MG tablet, TAKE 1 TABLET  (500 MG TOTAL) BY MOUTH FOUR (4) TIMES A DAY., Disp: , Rfl: 0 .  montelukast (SINGULAIR) 10 MG tablet, Take 10 mg by mouth at bedtime.  , Disp: , Rfl:  .  PROCTOSOL HC 2.5 % rectal cream, APPLY INTO AND AROUND RECTUM 2 TIMES A DAY, Disp: 28.35 g, Rfl: 0 .  propranolol (INDERAL) 20 MG tablet, Take 20 mg by mouth 2 (two) times daily., Disp: , Rfl:  .  rosuvastatin (CRESTOR) 10 MG tablet, Take 5 mg by mouth daily. Take 1/2 tablet daily, Disp: , Rfl:  .  tamsulosin (FLOMAX) 0.4 MG CAPS capsule, Take 0.4 mg by mouth daily after supper. , Disp: , Rfl:  .  traMADol (ULTRAM) 50 MG tablet, Take 50 mg by mouth every 6 (six) hours as needed. for pain, Disp: , Rfl: 0 .  trihexyphenidyl (ARTANE) 2 MG tablet, Take 1 tablet (2 mg total) by mouth every morning., Disp: 90 tablet, Rfl: 1 .  zolpidem (AMBIEN) 10 MG tablet, Take 10 mg by mouth at bedtime., Disp: , Rfl:     Observations/Objective:    Vitals:   09/02/18 1325  Weight: 225 lb (102.1 kg)  Height: 6\' 1"  (1.854 m)      GEN:  The patient appears stated age and is in NAD.  Neurological examination:  Orientation: The patient is alert and oriented x3. Cranial nerves: There is good facial symmetry. There is mild facial hypomimia.  EOMI.  The speech is fluent and clear. Soft palate rises symmetrically and there is no tongue deviation. Hearing is intact to conversational tone. Motor: Strength is at least antigravity x 4.   Shoulder shrug is equal and symmetric.  There is no pronator drift.  Movement examination: Tone: unable Abnormal movements: There is RUE rest tremor>LUE resting tremor Coordination:  There is decremation with finger taps bilaterally. Gait and Station: The patient has no difficulty arising out of a deep-seated chair without the use of the hands. The patient is more short stepped.  He has bent at the waist.  He has decreased arm swing on the right.  He has tremor of the right upper extremity.    Assessment and Plan:   1.     Idiopathic Parkinson's disease, with levodopa resistant tremor             - ET/PD is in the ddx.  I have suspected a possible atypical state, but have not really seen that progress.  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              -DaT scan positive for loss of DA in bilateral putamen in 08/2016.  I showed the patient and his wife the films and reiterated that this was not a diagnostic test.   -He has  had a levodopa challenge test in the past that did not show a significant benefit to levodopa, but his primary issue at that time was tremor.  He has since developed slowness of movement.    -Increase carbidopa/levodopa 25/100, so that he is taking 1 tablet at 7 AM/11 AM/3 PM/7 PM  -Discontinue trihexyphenidyl due to loss of balance.  -[facility]. 2.  Cerebral infarct (watershed territory) 02/2015             - Now has loop recorder.  TEE with PFO.  No indication to close given age and RoPE score of 5.   Carotid u/s normal.  LDL 75 at time of event. On plavix.  He asked me about changing to ASA but he had stroke on this so would recommend staying with the Plavix.    3.  Syncope             -as above, multiple episodes.    I'm still worried that this is orthostatic but cardiology didn't think that related to propranolol.  I told pt to keep track of his blood pressures.   has seen cardiology but didn't after last episode.    Told him to make sure he has an appointment.  His LINQ monitor has not shown any reasons for syncope.    -We will check on his carotid ultrasound.   Follow Up Instructions:    -I discussed the assessment and treatment plan with the patient. The patient was provided an opportunity to ask questions and all were answered. The patient agreed with the plan and demonstrated an understanding of the instructions.   The patient was advised to call back or seek an in-person evaluation if the symptoms worsen or if the condition fails to improve as  anticipated.    Total Time spent in visit with the patient was:  25 min, of which more than 50% of the time was spent in counseling and/or coordinating care on safety with PD.   Pt understands and agrees with the plan of care outlined.     Alonza Bogus, DO

## 2018-09-02 ENCOUNTER — Encounter: Payer: Self-pay | Admitting: Neurology

## 2018-09-02 ENCOUNTER — Other Ambulatory Visit: Payer: Self-pay

## 2018-09-02 ENCOUNTER — Telehealth (INDEPENDENT_AMBULATORY_CARE_PROVIDER_SITE_OTHER): Payer: Medicare HMO | Admitting: Neurology

## 2018-09-02 VITALS — Ht 73.0 in | Wt 225.0 lb

## 2018-09-02 DIAGNOSIS — G20 Parkinson's disease: Secondary | ICD-10-CM | POA: Diagnosis not present

## 2018-09-02 DIAGNOSIS — R55 Syncope and collapse: Secondary | ICD-10-CM | POA: Diagnosis not present

## 2018-09-02 DIAGNOSIS — G903 Multi-system degeneration of the autonomic nervous system: Secondary | ICD-10-CM

## 2018-09-02 MED ORDER — CARBIDOPA-LEVODOPA 25-100 MG PO TABS: 1.0000 | ORAL_TABLET | Freq: Four times a day (QID) | ORAL | 1 refills | Status: DC

## 2018-09-02 NOTE — Addendum Note (Signed)
Addended by: Ranae Plumber on: 09/02/2018 04:26 PM   Modules accepted: Orders

## 2018-09-08 ENCOUNTER — Other Ambulatory Visit: Payer: Medicare HMO

## 2018-09-09 ENCOUNTER — Telehealth: Payer: Self-pay

## 2018-09-09 ENCOUNTER — Ambulatory Visit
Admission: RE | Admit: 2018-09-09 | Discharge: 2018-09-09 | Disposition: A | Discharge status: Home / Self Care | Payer: Medicare HMO | Source: Ambulatory Visit | Attending: Neurology | Admitting: Neurology

## 2018-09-09 DIAGNOSIS — R531 Weakness: Secondary | ICD-10-CM

## 2018-09-09 DIAGNOSIS — R55 Syncope and collapse: Secondary | ICD-10-CM

## 2018-09-09 DIAGNOSIS — I6523 Occlusion and stenosis of bilateral carotid arteries: Secondary | ICD-10-CM | POA: Diagnosis not present

## 2018-09-09 NOTE — Telephone Encounter (Signed)
-----   Message from Burg[place], DO sent at 09/09/2018  2:20 PM EDT ----- Let pt know that carotid u/s looks good

## 2018-09-09 NOTE — Telephone Encounter (Signed)
Called spoke with patient he was made aware of this.

## 2018-09-22 ENCOUNTER — Encounter: Payer: Self-pay | Admitting: Physical Therapy

## 2018-09-22 ENCOUNTER — Ambulatory Visit: Payer: Medicare HMO | Attending: Neurology | Admitting: Physical Therapy

## 2018-09-22 ENCOUNTER — Other Ambulatory Visit: Payer: Self-pay

## 2018-09-22 DIAGNOSIS — R293 Abnormal posture: Secondary | ICD-10-CM | POA: Insufficient documentation

## 2018-09-22 DIAGNOSIS — R2681 Unsteadiness on feet: Secondary | ICD-10-CM | POA: Insufficient documentation

## 2018-09-22 DIAGNOSIS — R2689 Other abnormalities of gait and mobility: Secondary | ICD-10-CM | POA: Diagnosis not present

## 2018-09-23 NOTE — Therapy (Signed)
[facility] [address] [place], [place], [address] Phone: [phone]   Fax:  [phone]  Physical Therapy Evaluation  Patient Details  Name: Joshua [name] MRN: [id number] Date of Birth: 31-Oct-1945 Referring Provider (PT): Alonza Bogus, DO   Encounter Date: 09/22/2018  CLINIC OPERATION CHANGES: [facility] is open at lower capacity following universal masking, social distancing, and patient screening.  The patient's COVID risk of complications score is 4.   PT End of Session - 09/23/18 1259    Visit Number  1    Number of Visits  13    Date for PT Re-Evaluation  11/22/18    Authorization Type  [employer]-will need 10th visit progress note    PT Start Time  1225    PT Stop Time  1312    PT Time Calculation (min)  47 min    Equipment Utilized During Treatment  Gait belt    Activity Tolerance  Patient tolerated treatment well    Behavior During Therapy  WFL for tasks assessed/performed       Past Medical History:  Diagnosis Date  . Allergic rhinitis   . Allergy   . Anal fissure   . Anxiety    from chronic pain from surgery- on Cymbalta  . Arthritis   . Asthma   . Barrett's esophagus 03/29/2014  . Carotid artery disease ([place])    Carotid Doppler normal August, 2007  . Cataract   . Chest pain    Coronaries normal 1996 /  nuclear..06/2002..normal...EF  56% /  stress echo.. May, 2011.... no  scar or ischemia... rate related RBBB  . Diverticulosis   . Dyslipidemia   . Ejection fraction    EF 60%, stress echo, May, 2011  . GERD (gastroesophageal reflux disease)   . Headache   . History of loop recorder    has since 04/04/15  . HTN (hypertension)    takes Metoprolol for PVC control  . Hx of colonic polyps    adenomatous  . Hx of colonoscopy   . Hyperlipidemia   . IFG (impaired fasting glucose)   . Palpitations    Benign PVCs  . Parkinson disease ([place])   . Prostate cancer ([place])    . RBBB (right bundle branch block)    rate related  . Shingles   . Sleep apnea    pt denies  . Stroke [facility])    TIA  . TIA (transient ischemic attack) 02/2015   Per pt, had 2 strokes  . Tremor    Hand tremor  . Vertigo     Past Surgical History:  Procedure Laterality Date  . BACK SURGERY  2002,2009   x 6  . CHOLECYSTECTOMY  11/30/2012   with IOC  . COLONOSCOPY    . ELECTROPHYSIOLOGIC STUDY N/A 09/26/2015   Procedure: V Tach Ablation (PVC);  Surgeon: Will Meredith Leeds, MD;  Location: [facility];  Service: Cardiovascular;  Laterality: N/A;  . EP IMPLANTABLE DEVICE N/A 04/04/2015   Procedure: Loop Recorder Insertion;  Surgeon: Will Meredith Leeds, MD;  Location: [facility];  Service: Cardiovascular;  Laterality: N/A;  . HERNIA REPAIR     laprascopic  . KNEE SURGERY     DECEMBER 2017,LEFT KNEE SCOPED  . NECK SURGERY  2002  . POLYPECTOMY    . ROTATOR CUFF REPAIR Left   . TEE WITHOUT CARDIOVERSION N/A 04/04/2015   Procedure: TRANSESOPHAGEAL ECHOCARDIOGRAM (TEE);  Surgeon: Lelon Perla, MD;  Location: [facility];  Service: Cardiovascular;  Laterality: N/A;  . UPPER GASTROINTESTINAL ENDOSCOPY    . V Tach ablation  09/26/2015    There were no vitals filed for this visit.   Subjective Assessment - 09/22/18 1228    Subjective  Pt reports some trouble with balance, tremors, lot of trouble shuffling my feet, particularly with tight spaces.  I've had 2 falls in the past several months-pretty bad.  Took my shoe off and foot got tangled, lost balance and fell; second fall was feet tangling up at bed and fell hitting head.  Had some near falls, often going backwards.  Uses cane for mobility.  Able to get up from floor on his own.    Pertinent History  PMH includes multiple back surgeries/fusion neck and lumbar spine (LLE weaker), ?syncopal episodes, arthritis    How long can you stand comfortably?  5 minutes    Patient Stated Goals  Pt's goal for therapy is to  improve balance, walking.    Currently in Pain?  Yes    Pain Score  8    at worst   Pain Location  Back    Pain Orientation  Lower    Pain Descriptors / Indicators  Aching    Pain Type  Chronic pain    Pain Onset  More than a month ago    Aggravating Factors   walking    Pain Relieving Factors  stop walking, taking breaks    Effect of Pain on Daily Activities  PT will monitor pain, but will not address as a goal at this time, as pain is chronic in nature.         [facility] PT Assessment - 09/22/18 1236      Assessment   Medical Diagnosis  Parkinson's disease    Referring Provider (PT)  Wells Guiles Tat, DO    Onset Date/Surgical Date  09/02/18      Precautions   Precautions  Fall      Balance Screen   Has the patient fallen in the past 6 months  Yes    How many times?  2    Has the patient had a decrease in activity level because of a fear of falling?   No    Is the patient reluctant to leave their home because of a fear of falling?   No      Home Environment   Living Environment  Private residence    Living Arrangements  Spouse/significant other    Available Help at Discharge  Family    Type of [place] Access  Level entry    [place] - single point;Grab bars - toilet;Grab bars - tub/shower;Shower seat      Prior Function   Level of Independence  Independent    Leisure  Exercises at [employer] with trainer 2x/wk; grandchildren spend several days a week with them      Observation/Other Assessments   Focus on Therapeutic Outcomes (FOTO)   NA      Posture/Postural Control   Posture/Postural Control  Postural limitations    Postural Limitations  Rounded Shoulders;Forward head;Flexed trunk   Forward flexed trunk in standing     ROM / Strength   AROM / PROM / Strength  Strength      Strength   Overall Strength  Deficits    Overall Strength Comments  Grossly tested 4+/5 RLE, 4/5 LLE (  weakness LLE due to back pain per pt report)       Transfers   Transfers  Sit to Stand;Stand to Sit    Sit to Stand  6: Modified independent (Device/Increase time);From chair/3-in-1;With armrests;With upper extremity assist    Five time sit to stand comments   24.5   with UE support   Stand to Sit  6: Modified independent (Device/Increase time);With upper extremity assist;With armrests;To chair/3-in-1      Ambulation/Gait   Ambulation/Gait  Yes    Ambulation/Gait Assistance  6: Modified independent (Device/Increase time)   Has cane, but does not use during gait velocity test   Ambulation Distance (Feet)  200 Feet    Assistive device  Straight cane;None    Gait Pattern  Step-through pattern;Decreased arm swing - right;Decreased step length - right;Decreased step length - left;Decreased trunk rotation;Trunk flexed    Ambulation Surface  Level;Indoor    Gait velocity  12.69 sec = 2.58 ft/sec      Standardized Balance Assessment   Standardized Balance Assessment  Timed Up and Go Test;Dynamic Gait Index      Dynamic Gait Index   Level Surface  Mild Impairment    Change in Gait Speed  Mild Impairment    Gait with Horizontal Head Turns  Mild Impairment    Gait with Vertical Head Turns  Mild Impairment    Gait and Pivot Turn  Mild Impairment    Step Over Obstacle  Moderate Impairment    Step Around Obstacles  Mild Impairment    Steps  Mild Impairment    Total Score  15    DGI comment:  Scores <19/24 indicated increased fall risk.   Uses cane for DGI     Timed Up and Go Test   Normal TUG (seconds)  15.94    Cognitive TUG (seconds)  15.56    TUG Comments  Scores >13.5-15 seconds indicate increased fall risk      High Level Balance   High Level Balance Comments  Posterior push and release:  would fall if unaided; anterior push and release:  2 steps and able to regain balance.  Standing EO/EC 30 sec solid surfaces; stands EO on foam 30 seconds, EC 30 seconds on foam (with increased sway, close PT supervision)                 Objective measurements completed on examination: See above findings.                PT Short Term Goals - 09/23/18 1319      PT SHORT TERM GOAL #1   Title  Pt will be independent with HEP for Parkinson's specific deficits.  TARGET 10/21/2018    Time  4    Period  Weeks    Status  New    Target Date  10/21/18      PT SHORT TERM GOAL #2   Title  Pt will improve 5x sit<>stand to less than or equal to 20 seconds to demonstrate improved transfer efficiency and safety.    Time  4    Period  Weeks    Status  New    Target Date  10/21/18      PT SHORT TERM GOAL #3   Title  Pt will improve TUG score to less than or equal to 13.5 seconds for decreased fall risk.    Time  4    Period  Weeks    Status  New  Target Date  10/21/18      PT SHORT TERM GOAL #4   Title  Pt will verbalize understanding of fall prevention in the home environment.    Time  4    Period  Weeks    Status  New    Target Date  10/21/18        PT Long Term Goals - 09/23/18 1321      PT LONG TERM GOAL #1   Title  Pt will be independent with progression of HEP for improved balance, posture, gait.  TARGET 11/04/2018    Time  6    Period  Weeks    Status  New    Target Date  11/04/18      PT LONG TERM GOAL #2   Title  Pt will improve DGI to at least 19/24 for decreased fall risk.    Time  6    Period  Weeks    Status  New    Target Date  11/04/18      PT LONG TERM GOAL #3   Title  Pt will improve gait velocity to at least 2.8 ft/sec for improved community ambulator status, improved efficiency/safety with gait.    Time  6    Status  New    Target Date  11/04/18      PT LONG TERM GOAL #4   Title  Pt will verbalize plans for continued community fitness upon d/c.    Time  6    Period  Weeks    Status  New    Target Date  11/04/18             Plan - 09/23/18 1301    Clinical Impression Statement  Pt is a 73 year old male with history of Parkinson's disease,  with tremors, abnormal posture, decreased timing and coordination of gait, decreased balance, postural instability, history of falls.  He reports at least 2-3 falls in past 6 months.  With posterior push and release test, he would fall if unaided; he is at fall risk per TUG and DGI scores.  He does have history of low back pain and surgeries with resulting LLE weakness; however, falls and balance changes are new for the patient in the past several years.  He would benefit from skilled PT to address the above stated deficits to decrease fall risk and improve overall functional mobility.    Personal Factors and Comorbidities  Comorbidity 3+    Comorbidities  PMH includes chronic pain, asthma, OA, CAD, GERD, HTN, back surgeries, neck surgery, L RTC repair ; hx of hand pain R, trigger fingers L    Examination-Activity Limitations  Locomotion Level;Transfers    Examination-Participation Restrictions  Community Activity   community fitness   Stability/Clinical Decision Making  Evolving/Moderate complexity    Clinical Decision Making  Moderate    Rehab Potential  Good    PT Frequency  2x / week    PT Duration  6 weeks   plus eval   PT Treatment/Interventions  ADLs/Self Care Home Management;Functional mobility training;Therapeutic activities;Therapeutic exercise;Balance training;Neuromuscular re-education;Gait training;Patient/family education    PT Next Visit Plan  Initiate HEP for standing PWR! Moves (as able-as back pain allows), postural and stepping exercises, balance strategies; possibility of aquatics    Consulted and Agree with Plan of Care  Patient       Patient will benefit from skilled therapeutic intervention in order to improve the following deficits and impairments:  Abnormal gait, Decreased  balance, Decreased mobility, Decreased strength, Difficulty walking, Postural dysfunction  Visit Diagnosis: Other abnormalities of gait and mobility  Unsteadiness on feet  Abnormal  posture     Problem List Patient Active Problem List   Diagnosis Date Noted  . Malignant neoplasm of prostate ([place]) 11/01/2017  . Essential tremor 12/06/2015  . PVC (premature ventricular contraction) 09/26/2015  . Embolic stroke involving left middle cerebral artery ([place]) 04/07/2015  . Acute CVA (cerebrovascular accident) ([place]) 03/06/2015  . Stroke (cerebrum) ([place]) 03/05/2015  . CVA (cerebral infarction) 03/05/2015  . Weakness of right upper extremity   . Tremor   . Ejection fraction   . Vertigo   . Sleep apnea   . Palpitations   . Dyslipidemia   . GERD (gastroesophageal reflux disease)   . HTN (hypertension)   . Chest pain   . RBBB (right bundle branch block)   . Ejection fraction   . Carotid artery disease ([place])     Landy Mace W. 09/23/2018, 1:24 PM Frazier Butt., PT  [facility] [address] [place], [place], [address] Phone: [phone]   Fax:  [phone]  Name: Joshua [name] MRN: [id number] Date of Birth: [birth year]

## 2018-09-27 ENCOUNTER — Ambulatory Visit: Payer: Medicare HMO | Admitting: Physical Therapy

## 2018-09-27 ENCOUNTER — Other Ambulatory Visit: Payer: Self-pay

## 2018-09-27 DIAGNOSIS — R293 Abnormal posture: Secondary | ICD-10-CM | POA: Diagnosis not present

## 2018-09-27 DIAGNOSIS — R2689 Other abnormalities of gait and mobility: Secondary | ICD-10-CM

## 2018-09-27 DIAGNOSIS — R2681 Unsteadiness on feet: Secondary | ICD-10-CM | POA: Diagnosis not present

## 2018-09-27 NOTE — Patient Instructions (Addendum)
Search "PWR Moves Standing You Tube" on Internet.  This will pull up video of the above that goes through the demonstration of the moves.  It does not go through multiple repetitions, just the technique.  www.pwr4life.org Parkinson's [facility]    Fall Prevention in the Home, Adult Falls can cause injuries and can affect people from all age groups. There are many simple things that you can do to make your home safe and to help prevent falls. Ask for help when making these changes, if needed. What actions can I take to prevent falls? General instructions  Use good lighting in all rooms. Replace any light bulbs that burn out.  Turn on lights if it is dark. Use night-lights.  Place frequently used items in easy-to-reach places. Lower the shelves around your home if necessary.  Set up furniture so that there are clear paths around it. Avoid moving your furniture around.  Remove throw rugs and other tripping hazards from the floor.  Avoid walking on wet floors.  Fix any uneven floor surfaces.  Add color or contrast paint or tape to grab bars and handrails in your home. Place contrasting color strips on the first and last steps of stairways.  When you use a stepladder, make sure that it is completely opened and that the sides are firmly locked. Have someone hold the ladder while you are using it. Do not climb a closed stepladder.  Be aware of any and all pets. What can I do in the bathroom?      Keep the floor dry. Immediately clean up any water that spills onto the floor.  Remove soap buildup in the tub or shower on a regular basis.  Use non-skid mats or decals on the floor of the tub or shower.  Attach bath mats securely with double-sided, non-slip rug tape.  If you need to sit down while you are in the shower, use a plastic, non-slip stool.  Install grab bars by the toilet and in the tub and shower. Do not use towel bars as grab bars. What can I do in the  bedroom?  Make sure that a bedside light is easy to reach.  Do not use oversized bedding that drapes onto the floor.  Have a firm chair that has side arms to use for getting dressed. What can I do in the kitchen?  Clean up any spills right away.  If you need to reach for something above you, use a sturdy step stool that has a grab bar.  Keep electrical cables out of the way.  Do not use floor polish or wax that makes floors slippery. If you must use wax, make sure that it is non-skid floor wax. What can I do in the stairways?  Do not leave any items on the stairs.  Make sure that you have a light switch at the top of the stairs and the bottom of the stairs. Have them installed if you do not have them.  Make sure that there are handrails on both sides of the stairs. Fix handrails that are broken or loose. Make sure that handrails are as long as the stairways.  Install non-slip stair treads on all stairs in your home.  Avoid having throw rugs at the top or bottom of stairways, or secure the rugs with carpet tape to prevent them from moving.  Choose a carpet design that does not hide the edge of steps on the stairway.  Check any carpeting to  make sure that it is firmly attached to the stairs. Fix any carpet that is loose or worn. What can I do on the outside of my home?  Use bright outdoor lighting.  Regularly repair the edges of walkways and driveways and fix any cracks.  Remove high doorway thresholds.  Trim any shrubbery on the main path into your home.  Regularly check that handrails are securely fastened and in good repair. Both sides of any steps should have handrails.  Install guardrails along the edges of any raised decks or porches.  Clear walkways of debris and clutter, including tools and rocks.  Have leaves, snow, and ice cleared regularly.  Use sand or salt on walkways during winter months.  In the garage, clean up any spills right away, including grease  or oil spills. What other actions can I take?  Wear closed-toe shoes that fit well and support your feet. Wear shoes that have rubber soles or low heels.  Use mobility aids as needed, such as canes, walkers, scooters, and crutches.  Review your medicines with your health care provider. Some medicines can cause dizziness or changes in blood pressure, which increase your risk of falling. Talk with your health care provider about other ways that you can decrease your risk of falls. This may include working with a physical therapist or trainer to improve your strength, balance, and endurance. Where to find more information  Centers for Disease Control and Prevention, [facility]: WebmailGuide.co.za  [employer] on Aging: BrainJudge.co.uk Contact a health care provider if:  You are afraid of falling at home.  You feel weak, drowsy, or dizzy at home.  You fall at home. Summary  There are many simple things that you can do to make your home safe and to help prevent falls.  Ways to make your home safe include removing tripping hazards and installing grab bars in the bathroom.  Ask for help when making these changes in your home. This information is not intended to replace advice given to you by your health care provider. Make sure you discuss any questions you have with your health care provider. Document Released: 12/19/2001 Document Revised: 12/11/2016 Document Reviewed: 08/13/2016 Elsevier Patient Education  [employer].  It is important to avoid accidents which may result in broken bones.  Here are a few ideas on how to make your home safer so you will be less likely to trip or fall.  1. Use nonskid mats or non slip strips in your shower or tub, on your bathroom floor and around sinks.  If you know that you have spilled water, wipe it up! 2. In the bathroom, it is important to have properly installed grab bars on the walls or on the edge of the tub.  Towel racks  are NOT strong enough for you to hold onto or to pull on for support. 3. Stairs and hallways should have enough light.  Add lamps or night lights if you need ore light. 4. It is good to have handrails on both sides of the stairs if possible.  Always fix broken handrails right away. 5. It is important to see the edges of steps.  Paint the edges of outdoor steps white so you can see them better.  Put colored tape on the edge of inside steps. 6. Throw-rugs are dangerous because they can slide.  Removing the rugs is the best idea, but if they must stay, add adhesive carpet tape to prevent slipping. 7. Do not keep things on  stairs or in the halls.  Remove small furniture that blocks the halls as it may cause you to trip.  Keep telephone and electrical cords out of the way where you walk. 8. Always were sturdy, rubber-soled shoes for good support.  Never wear just socks, especially on the stairs.  Socks may cause you to slip or fall.  Do not wear full-length housecoats as you can easily trip on the bottom.  9. Place the things you use the most on the shelves that are the easiest to reach.  If you use a stepstool, make sure it is in good condition.  If you feel unsteady, DO NOT climb, ask for help. 10. If a health professional advises you to use a cane or walker, do not be ashamed.  These items can keep you from falling and breaking your bones.

## 2018-09-27 NOTE — Therapy (Signed)
[facility] [address] [place], [place], [address] Phone: [phone]   Fax:  [phone]  Physical Therapy Treatment  Patient Details  Name: Joshua [name] MRN: [id number] Date of Birth: September 04, 1945 Referring Provider (PT): Alonza Bogus, DO   Encounter Date: 09/27/2018  PT End of Session - 09/27/18 1117    Visit Number  2    Number of Visits  13    Date for PT Re-Evaluation  11/22/18    Authorization Type  [employer] Medicare-will need 10th visit progress note    PT Start Time  1020    PT Stop Time  1100    PT Time Calculation (min)  40 min    Activity Tolerance  Patient tolerated treatment well    Behavior During Therapy  [facility] for tasks assessed/performed       Past Medical History:  Diagnosis Date  . Allergic rhinitis   . Allergy   . Anal fissure   . Anxiety    from chronic pain from surgery- on Cymbalta  . Arthritis   . Asthma   . Barrett's esophagus 03/29/2014  . Carotid artery disease ([place])    Carotid Doppler normal August, 2007  . Cataract   . Chest pain    Coronaries normal 1996 /  nuclear..06/2002..normal...EF  56% /  stress echo.. May, 2011.... no  scar or ischemia... rate related RBBB  . Diverticulosis   . Dyslipidemia   . Ejection fraction    EF 60%, stress echo, May, 2011  . GERD (gastroesophageal reflux disease)   . Headache   . History of loop recorder    has since 04/04/15  . HTN (hypertension)    takes Metoprolol for PVC control  . Hx of colonic polyps    adenomatous  . Hx of colonoscopy   . Hyperlipidemia   . IFG (impaired fasting glucose)   . Palpitations    Benign PVCs  . Parkinson disease ([place])   . Prostate cancer ([place])   . RBBB (right bundle branch block)    rate related  . Shingles   . Sleep apnea    pt denies  . Stroke [facility])    TIA  . TIA (transient ischemic attack) 02/2015   Per pt, had 2 strokes  . Tremor    Hand tremor  . Vertigo     Past Surgical History:   Procedure Laterality Date  . BACK SURGERY  2002,2009   x 6  . CHOLECYSTECTOMY  11/30/2012   with IOC  . COLONOSCOPY    . ELECTROPHYSIOLOGIC STUDY N/A 09/26/2015   Procedure: V Tach Ablation (PVC);  Surgeon: Will Meredith Leeds, MD;  Location: [place] [facility];  Service: Cardiovascular;  Laterality: N/A;  . EP IMPLANTABLE DEVICE N/A 04/04/2015   Procedure: Loop Recorder Insertion;  Surgeon: Will Meredith Leeds, MD;  Location: [place] CV LAB;  Service: Cardiovascular;  Laterality: N/A;  . HERNIA REPAIR     laprascopic  . KNEE SURGERY     DECEMBER 2017,LEFT KNEE SCOPED  . NECK SURGERY  2002  . POLYPECTOMY    . ROTATOR CUFF REPAIR Left   . TEE WITHOUT CARDIOVERSION N/A 04/04/2015   Procedure: TRANSESOPHAGEAL ECHOCARDIOGRAM (TEE);  Surgeon: Lelon Perla, MD;  Location: [facility] ENDOSCOPY;  Service: Cardiovascular;  Laterality: N/A;  . UPPER GASTROINTESTINAL ENDOSCOPY    . V Tach ablation  09/26/2015    There were no vitals filed for this visit.  Subjective Assessment - 09/27/18 1023  Subjective  Denies falls or changes.    Pertinent History  PMH includes multiple back surgeries/fusion neck and lumbar spine (LLE weaker), ?syncopal episodes, arthritis    How long can you stand comfortably?  5 minutes    Patient Stated Goals  Pt's goal for therapy is to improve balance, walking.    Currently in Pain?  Yes    Pain Score  7     Pain Location  Back   in between hip and back   Pain Orientation  Left    Pain Type  Chronic pain    Pain Onset  More than a month ago    Aggravating Factors   increase walking distance, extended sitting    Pain Relieving Factors  injection seemed to help       [facility] Adult PT Treatment/Exercise - 09/27/18 0001      Transfers   Transfers  Sit to Stand;Stand to Sit    Sit to Stand  6: Modified independent (Device/Increase time);From chair/3-in-1;With armrests;With upper extremity assist    Stand to Sit  6: Modified independent (Device/Increase time);With  upper extremity assist;With armrests;To chair/3-in-1      Posture/Postural Control   Posture/Postural Control  Postural limitations    Postural Limitations  Rounded Shoulders;Forward head;Flexed trunk      Self-Care   Self-Care  Other Self-Care Comments    Other Self-Care Comments   Educated on Fall Preventions techniques and strategies        PWR [facility]) - 09/27/18 1106    PWR! exercises  Moves in standing    PWR! Up  x 20    PWR! Rock  x 20    PWR! Twist  x 20    PWR Step  x 20    Comments  Standing with UE support of chair vs counter.  Needs verbal and tactile cues for technique due to first instruction of exercises.  Pt follows cues well but continues to need reinforcement with increased reps.  Needed several rest breaks during session.  Cues for open hands, equal sided movements and intensity.  Also showed pt PWR Twist in corner standing as alternative.  Also performed Reach facing counter with sticky notes for targets up on cabinets.    PWR! Step Through Forward/Back  At counter performing forward step weight shift x 12 each side with 1 UE support.  Cues for increasing BOS.      Pt denies increased back pain during treatment, just some fatigue in back.  Discussed performing stretches when pt gets home.    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  TARGET 10/21/2018    Time  4    Period  Weeks    Status  New    Target Date  10/21/18      PT SHORT TERM GOAL #2   Title  Pt will improve 5x sit<>stand to less than or equal to 20 seconds to demonstrate improved transfer efficiency and safety.     Time  4    Period  Weeks    Status  New    Target Date  10/21/18      PT SHORT TERM GOAL #3   Title  Pt will improve TUG score to less than or equal to 13.5 seconds for decreased fall risk.    Time  4    Period  Weeks    Status  New    Target Date  10/21/18      PT SHORT TERM GOAL #4   Title  Pt will verbalize understanding of fall prevention in the home environment.    Time  4    Period  Weeks    Status  New    Target Date  10/21/18        PT Long Term Goals - 09/23/18 1321      PT LONG TERM GOAL #1   Title  Pt will be independent with progression of HEP for improved balance, posture, gait.  TARGET 11/04/2018    Time  6    Period  Weeks    Status  New    Target Date  11/04/18      PT LONG TERM GOAL #2   Title  Pt will improve DGI to at least 19/24 for decreased fall risk.    Time  6    Period  Weeks    Status  New    Target Date  11/04/18      PT LONG TERM GOAL #3   Title  Pt will improve gait velocity to at least 2.8 ft/sec for improved community ambulator status, improved efficiency/safety with gait.    Time  6    Status  New    Target Date  11/04/18      PT LONG TERM GOAL #4   Title  Pt will verbalize plans for continued community fitness upon d/c.    Time  6    Period  Weeks    Status  New    Target Date  11/04/18            Plan - 09/27/18 1117    Clinical Impression Statement  Skilled session focused on initiating HEP along with education.  Pt responds wells to cues during session and appears motivated to improve mobility.  Tremor visible during treatment mainly in RUE.  Continue PT per POC.    Personal Factors and Comorbidities  Comorbidity 3+    Comorbidities  PMH includes chronic pain, asthma, OA, CAD, GERD, HTN, back surgeries, neck surgery, L RTC repair ; hx of hand pain R, trigger fingers L    Examination-Activity Limitations  Locomotion Level;Transfers    Examination-Participation Restrictions  Community Activity   community fitness    Stability/Clinical Decision Making  Evolving/Moderate complexity    Rehab Potential  Good    PT Frequency  2x / week    PT Duration  6 weeks   plus eval   PT Treatment/Interventions  ADLs/Self Care Home Management;Functional mobility training;Therapeutic activities;Therapeutic exercise;Balance training;Neuromuscular re-education;Gait training;Patient/family education    PT Next Visit Plan  Review  standing PWR! Moves (as able-as back pain allows), postural (pt is currently doing some from [employer]) and stepping exercises, balance strategies; possibility of aquatics    Consulted and Agree with Plan of Care  Patient       Patient will benefit from skilled therapeutic intervention in order to improve the following deficits and impairments:  Abnormal gait, Decreased balance, Decreased mobility, Decreased strength, Difficulty walking, Postural dysfunction  Visit Diagnosis: Other abnormalities of gait and mobility  Unsteadiness on  feet  Abnormal posture     Problem List Patient Active Problem List   Diagnosis Date Noted  . Malignant neoplasm of prostate ([place]) 11/01/2017  . Essential tremor 12/06/2015  . PVC (premature ventricular contraction) 09/26/2015  . Embolic stroke involving left middle cerebral artery ([place]) 04/07/2015  . Acute CVA (cerebrovascular accident) ([place]) 03/06/2015  . Stroke (cerebrum) ([place]) 03/05/2015  . CVA (cerebral infarction) 03/05/2015  . Weakness of right upper extremity   . Tremor   . Ejection fraction   . Vertigo   . Sleep apnea   . Palpitations   . Dyslipidemia   . GERD (gastroesophageal reflux disease)   . HTN (hypertension)   . Chest pain   . RBBB (right bundle branch block)   . Ejection fraction   . Carotid artery disease ([place])    Narda Bonds, [place] 09/27/18 11:21 AM Phone: [phone] Fax: [facility] [address] [place], [place], [address] Phone: [phone]   Fax:  [phone]  Name: Xsavier Matusik MRN: [id number] Date of Birth: June 13, 1945

## 2018-09-29 ENCOUNTER — Encounter: Payer: Self-pay | Admitting: Physical Therapy

## 2018-09-29 ENCOUNTER — Ambulatory Visit: Payer: Medicare HMO | Admitting: Physical Therapy

## 2018-09-29 ENCOUNTER — Other Ambulatory Visit: Payer: Self-pay

## 2018-09-29 DIAGNOSIS — R293 Abnormal posture: Secondary | ICD-10-CM | POA: Diagnosis not present

## 2018-09-29 DIAGNOSIS — R2689 Other abnormalities of gait and mobility: Secondary | ICD-10-CM

## 2018-09-29 DIAGNOSIS — R2681 Unsteadiness on feet: Secondary | ICD-10-CM | POA: Diagnosis not present

## 2018-09-29 NOTE — Patient Instructions (Addendum)
Turning in Place: Solid Surface    Standing in place, lead with head and turn slowly making quarter turns toward left. (Complete 180 degrees to Left, then 180 degrees to the right) Repeat __3__ times per session. Do __1-2__ sessions per day.   Copyright  VHI. All rights reserved.

## 2018-09-29 NOTE — Therapy (Signed)
[facility] [address] [place], [place], [address] Phone: [phone]   Fax:  [phone]  Physical Therapy Treatment  Patient Details  Name: Joshua [name] MRN: [id number] Date of Birth: 20-May-1945 Referring Provider (PT): Alonza Bogus, DO   Encounter Date: 09/29/2018  PT End of Session - 09/29/18 1448    Visit Number  3    Number of Visits  13    Date for PT Re-Evaluation  11/22/18    Authorization Type  Aetna Medicare-will need 10th visit progress note    PT Start Time  1148    PT Stop Time  1230    PT Time Calculation (min)  42 min    Activity Tolerance  Patient tolerated treatment well    Behavior During Therapy  [facility] for tasks assessed/performed       Past Medical History:  Diagnosis Date  . Allergic rhinitis   . Allergy   . Anal fissure   . Anxiety    from chronic pain from surgery- on Cymbalta  . Arthritis   . Asthma   . Barrett's esophagus 03/29/2014  . Carotid artery disease ([place])    Carotid Doppler normal August, 2007  . Cataract   . Chest pain    Coronaries normal 1996 /  nuclear..06/2002..normal...EF  56% /  stress echo.. May, 2011.... no  scar or ischemia... rate related RBBB  . Diverticulosis   . Dyslipidemia   . Ejection fraction    EF 60%, stress echo, May, 2011  . GERD (gastroesophageal reflux disease)   . Headache   . History of loop recorder    has since 04/04/15  . HTN (hypertension)    takes Metoprolol for PVC control  . Hx of colonic polyps    adenomatous  . Hx of colonoscopy   . Hyperlipidemia   . IFG (impaired fasting glucose)   . Palpitations    Benign PVCs  . Parkinson disease ([place])   . Prostate cancer ([place])   . RBBB (right bundle branch block)    rate related  . Shingles   . Sleep apnea    pt denies  . Stroke [facility])    TIA  . TIA (transient ischemic attack) 02/2015   Per pt, had 2 strokes  . Tremor    Hand tremor  . Vertigo     Past Surgical History:   Procedure Laterality Date  . BACK SURGERY  2002,2009   x 6  . CHOLECYSTECTOMY  11/30/2012   with IOC  . COLONOSCOPY    . ELECTROPHYSIOLOGIC STUDY N/A 09/26/2015   Procedure: V Tach Ablation (PVC);  Surgeon: Will Meredith Leeds, MD;  Location: [facility] LAB;  Service: Cardiovascular;  Laterality: N/A;  . EP IMPLANTABLE DEVICE N/A 04/04/2015   Procedure: Loop Recorder Insertion;  Surgeon: Will Meredith Leeds, MD;  Location: [place] [facility] LAB;  Service: Cardiovascular;  Laterality: N/A;  . HERNIA REPAIR     laprascopic  . KNEE SURGERY     DECEMBER 2017,LEFT KNEE SCOPED  . NECK SURGERY  2002  . POLYPECTOMY    . ROTATOR CUFF REPAIR Left   . TEE WITHOUT CARDIOVERSION N/A 04/04/2015   Procedure: TRANSESOPHAGEAL ECHOCARDIOGRAM (TEE);  Surgeon: Lelon Perla, MD;  Location: [facility] ENDOSCOPY;  Service: Cardiovascular;  Laterality: N/A;  . UPPER GASTROINTESTINAL ENDOSCOPY    . V Tach ablation  09/26/2015    There were no vitals filed for this visit.  Subjective Assessment - 09/29/18 1149  Subjective  No falls, no changes.  Have not yet had a chance to f/u with MD regarding R hand/wrist pain.    Pertinent History  PMH includes multiple back surgeries/fusion neck and lumbar spine (LLE weaker), ?syncopal episodes, arthritis    How long can you stand comfortably?  5 minutes    Patient Stated Goals  Pt's goal for therapy is to improve balance, walking.    Currently in Pain?  Yes    Pain Score  7     Pain Location  Back    Pain Orientation  Left    Pain Descriptors / Indicators  Aching    Pain Type  Chronic pain    Pain Onset  More than a month ago    Pain Frequency  Constant    Aggravating Factors   increased walking distance, extended sitting    Pain Relieving Factors  injection seemed to help                       [facility] Adult PT Treatment/Exercise - 09/29/18 0001      Transfers   Transfers  Sit to Stand;Stand to Sit    Sit to Stand  6: Modified independent  (Device/Increase time);From chair/3-in-1;With armrests;With upper extremity assist    Stand to Sit  6: Modified independent (Device/Increase time);With upper extremity assist;With armrests;To chair/3-in-1    Number of Reps  Other reps (comment)   5 reps, 2 sets from elevated mat, then 24" mat   Transfer Cueing  Cues for increased forward lean, foot placement for improved momentum to stand      Ambulation/Gait   Ambulation/Gait  Yes    Ambulation/Gait Assistance  6: Modified independent (Device/Increase time)    Ambulation Distance (Feet)  345 Feet    Assistive device  None    Gait Pattern  Step-through pattern;Decreased arm swing - right;Decreased step length - right;Decreased step length - left;Decreased trunk rotation;Trunk flexed    Ambulation Surface  Level;Indoor    Stairs  Yes    Stairs Assistance  5: Supervision    Stairs Assistance Details (indicate cue type and reason)  Cues for full foot placement ascending, foot clearance descending    Stair Management Technique  Two rails;Alternating pattern;Forwards    Number of Stairs  4   x 2   Height of Stairs  6    Gait Comments  Practice starts/stops with gait, with cues for widened BOS.  Tactile cues provided through shoulders for relaxed trunk rotation, improved arm swing.        PWR [facility]) - 09/29/18 1151    PWR! exercises  Moves in standing    PWR! Up  x 10    PWR! Rock  x 10 reps each side    PWR! Twist  x 5 reps   Cues to stop in middle, pt brings both hands side to side   PWR Step  x 10 reps each side    Comments  Review of HEP given last visit, with pt return demo understanding with min cues for technique, amplitude.  (For PWR! Twist, pt does not perform correctly with stop in the middle; did not continue or perform in the corner, as pt reported previous trunk rotation had bothered his back.     Explained relation of each PWR! Move to functional activities.  Cues provided throughout exercises in session for widened  BOS.  Balance Exercises - 09/29/18 1202      Balance Exercises: Standing  Stepping Strategy  Anterior;Posterior;UE support;10 reps   2 sets (2nd set alternating)   Turning  Right;Left;5 reps   180degree turns, with quarter turn method   Other Standing Exercises  Forward/back step and weightshifting x 10 reps with UE support, cues for technique   Pt needs cues thorughout for technique        PT Education - 09/29/18 1447    Education Details  Reviewed PWR! Moves; added turning techniques to HEP    Person(s) Educated  Patient    Methods  Explanation;Demonstration;Verbal cues;Handout    Comprehension  Verbalized understanding;Returned demonstration;Verbal cues required       PT Short Term Goals - 09/23/18 1319      PT SHORT TERM GOAL #1   Title  Pt will be independent with HEP for Parkinson's specific deficits.  TARGET 10/21/2018    Time  4    Period  Weeks    Status  New    Target Date  10/21/18      PT SHORT TERM GOAL #2   Title  Pt will improve 5x sit<>stand to less than or equal to 20 seconds to demonstrate improved transfer efficiency and safety.    Time  4    Period  Weeks    Status  New    Target Date  10/21/18      PT SHORT TERM GOAL #3   Title  Pt will improve TUG score to less than or equal to 13.5 seconds for decreased fall risk.    Time  4    Period  Weeks    Status  New    Target Date  10/21/18      PT SHORT TERM GOAL #4   Title  Pt will verbalize understanding of fall prevention in the home environment.    Time  4    Period  Weeks    Status  New    Target Date  10/21/18        PT Long Term Goals - 09/23/18 1321      PT LONG TERM GOAL #1   Title  Pt will be independent with progression of HEP for improved balance, posture, gait.  TARGET 11/04/2018    Time  6    Period  Weeks    Status  New    Target Date  11/04/18      PT LONG TERM GOAL #2   Title  Pt will improve DGI to at least 19/24 for decreased fall risk.    Time  6    Period  Weeks     Status  New    Target Date  11/04/18      PT LONG TERM GOAL #3   Title  Pt will improve gait velocity to at least 2.8 ft/sec for improved community ambulator status, improved efficiency/safety with gait.    Time  6    Status  New    Target Date  11/04/18      PT LONG TERM GOAL #4   Title  Pt will verbalize plans for continued community fitness upon d/c.    Time  6    Period  Weeks    Status  New    Target Date  11/04/18            Plan - 09/29/18 1448    Clinical Impression Statement  REviewed PWR! Moves in standing today, as well as working on transfers and turning techniques.  Pt performs PWR! Up, Rock, Step  with good form, with more difficulty with PWR! twist technique.  He is able to incorporate pivoting with feet for improved trunk flexibility, but he has difficulty with sequencing stop in the middle position for PWR! Twist.  Worked additionally on forward/back step and weigthshift, with occasional UE support for balance.  Pt will continue to benefit from skilled PT to address balance, posture and gait.    Personal Factors and Comorbidities  Comorbidity 3+    Comorbidities  PMH includes chronic pain, asthma, OA, CAD, GERD, HTN, back surgeries, neck surgery, L RTC repair ; hx of hand pain R, trigger fingers L    Examination-Activity Limitations  Locomotion Level;Transfers    Examination-Participation Restrictions  Community Activity   community fitness   Stability/Clinical Decision Making  Evolving/Moderate complexity    Rehab Potential  Good    PT Frequency  2x / week    PT Duration  6 weeks   plus eval   PT Treatment/Interventions  ADLs/Self Care Home Management;Functional mobility training;Therapeutic activities;Therapeutic exercise;Balance training;Neuromuscular re-education;Gait training;Patient/family education    PT Next Visit Plan  Review turning technique; forward/back step and weightshfiting, balance strategies; conitnue to think about possibility of aquatics.     Consulted and Agree with Plan of Care  Patient       Patient will benefit from skilled therapeutic intervention in order to improve the following deficits and impairments:  Abnormal gait, Decreased balance, Decreased mobility, Decreased strength, Difficulty walking, Postural dysfunction  Visit Diagnosis: Unsteadiness on feet  Abnormal posture  Other abnormalities of gait and mobility     Problem List Patient Active Problem List   Diagnosis Date Noted  . Malignant neoplasm of prostate ([place]) 11/01/2017  . Essential tremor 12/06/2015  . PVC (premature ventricular contraction) 09/26/2015  . Embolic stroke involving left middle cerebral artery ([place]) 04/07/2015  . Acute CVA (cerebrovascular accident) ([place]) 03/06/2015  . Stroke (cerebrum) ([place]) 03/05/2015  . CVA (cerebral infarction) 03/05/2015  . Weakness of right upper extremity   . Tremor   . Ejection fraction   . Vertigo   . Sleep apnea   . Palpitations   . Dyslipidemia   . GERD (gastroesophageal reflux disease)   . HTN (hypertension)   . Chest pain   . RBBB (right bundle branch block)   . Ejection fraction   . Carotid artery disease ([place])     Jennalyn Cawley W. 09/29/2018, 2:52 PM  Frazier Butt., PT  [facility] [address] [place], [place], [address] Phone: [phone]   Fax:  [phone]  Name: Leward Demers MRN: [id number] Date of Birth: [birth year]

## 2018-09-30 DIAGNOSIS — Z23 Encounter for immunization: Secondary | ICD-10-CM | POA: Diagnosis not present

## 2018-10-04 ENCOUNTER — Other Ambulatory Visit: Payer: Self-pay

## 2018-10-04 ENCOUNTER — Encounter: Payer: Self-pay | Admitting: Physical Therapy

## 2018-10-04 ENCOUNTER — Ambulatory Visit: Payer: Medicare HMO | Admitting: Physical Therapy

## 2018-10-04 DIAGNOSIS — R2689 Other abnormalities of gait and mobility: Secondary | ICD-10-CM

## 2018-10-04 DIAGNOSIS — R2681 Unsteadiness on feet: Secondary | ICD-10-CM

## 2018-10-04 DIAGNOSIS — R293 Abnormal posture: Secondary | ICD-10-CM | POA: Diagnosis not present

## 2018-10-04 NOTE — Therapy (Signed)
[facility] [address] [place], [place], [address] Phone: [phone]   Fax:  [phone]  Physical Therapy Treatment  Patient Details  Name: Joshua [name] MRN: [id number] Date of Birth: 12-23-1945 Referring Provider (PT): Alonza Bogus, DO   Encounter Date: 10/04/2018  PT End of Session - 10/04/18 2035    Visit Number  4    Number of Visits  13    Date for PT Re-Evaluation  11/22/18    Authorization Type  [employer] Medicare-will need 10th visit progress note    PT Start Time  1016    PT Stop Time  1100    PT Time Calculation (min)  44 min    Equipment Utilized During Treatment  Gait belt    Activity Tolerance  Patient tolerated treatment well    Behavior During Therapy  [facility] for tasks assessed/performed       Past Medical History:  Diagnosis Date  . Allergic rhinitis   . Allergy   . Anal fissure   . Anxiety    from chronic pain from surgery- on Cymbalta  . Arthritis   . Asthma   . Barrett's esophagus 03/29/2014  . Carotid artery disease ([place])    Carotid Doppler normal August, 2007  . Cataract   . Chest pain    Coronaries normal 1996 /  nuclear..06/2002..normal...EF  56% /  stress echo.. May, 2011.... no  scar or ischemia... rate related RBBB  . Diverticulosis   . Dyslipidemia   . Ejection fraction    EF 60%, stress echo, May, 2011  . GERD (gastroesophageal reflux disease)   . Headache   . History of loop recorder    has since 04/04/15  . HTN (hypertension)    takes Metoprolol for PVC control  . Hx of colonic polyps    adenomatous  . Hx of colonoscopy   . Hyperlipidemia   . IFG (impaired fasting glucose)   . Palpitations    Benign PVCs  . Parkinson disease ([place]   . Prostate cancer ([place])   . RBBB (right bundle branch block)    rate related  . Shingles   . Sleep apnea    pt denies  . Stroke [facility])    TIA  . TIA (transient ischemic attack) 02/2015   Per pt, had 2 strokes  . Tremor    Hand tremor  . Vertigo     Past Surgical History:  Procedure Laterality Date  . BACK SURGERY  2002,2009   x 6  . CHOLECYSTECTOMY  11/30/2012   with IOC  . COLONOSCOPY    . ELECTROPHYSIOLOGIC STUDY N/A 09/26/2015   Procedure: V Tach Ablation (PVC);  Surgeon: Will Meredith Leeds, MD;  Location: [place] [facility];  Service: Cardiovascular;  Laterality: N/A;  . EP IMPLANTABLE DEVICE N/A 04/04/2015   Procedure: Loop Recorder Insertion;  Surgeon: Will Meredith Leeds, MD;  Location: [place] [facility];  Service: Cardiovascular;  Laterality: N/A;  . HERNIA REPAIR     laprascopic  . KNEE SURGERY     DECEMBER 2017,LEFT KNEE SCOPED  . NECK SURGERY  2002  . POLYPECTOMY    . ROTATOR CUFF REPAIR Left   . TEE WITHOUT CARDIOVERSION N/A 04/04/2015   Procedure: TRANSESOPHAGEAL ECHOCARDIOGRAM (TEE);  Surgeon: Lelon Perla, MD;  Location: [facility] ENDOSCOPY;  Service: Cardiovascular;  Laterality: N/A;  . UPPER GASTROINTESTINAL ENDOSCOPY    . V Tach ablation  09/26/2015    There were no vitals filed for  this visit.  Subjective Assessment - 10/04/18 1018    Subjective  Been keeping the grandkids working on their schoolwork, so I've been busy.  Feel like the exercises are helping.    Pertinent History  PMH includes multiple back surgeries/fusion neck and lumbar spine (LLE weaker), ?syncopal episodes, arthritis    How long can you stand comfortably?  5 minutes    Patient Stated Goals  Pt's goal for therapy is to improve balance, walking.    Currently in Pain?  Yes    Pain Score  7     Pain Location  Back    Pain Orientation  Left    Pain Descriptors / Indicators  Aching    Pain Type  Chronic pain    Pain Onset  More than a month ago    Aggravating Factors   increased walking distance, extended sitting    Pain Relieving Factors  injection seemed to help, exercises do not bother                       [facility] Adult PT Treatment/Exercise - 10/04/18 0001      Ambulation/Gait    Ambulation/Gait  Yes    Ambulation/Gait Assistance  6: Modified independent (Device/Increase time)    Ambulation Distance (Feet)  230 Feet   no device; 230 ft with single walking pole   Assistive device  None    Gait Pattern  Step-through pattern;Decreased arm swing - right;Decreased step length - right;Decreased step length - left;Decreased trunk rotation;Trunk flexed    Ambulation Surface  Level;Indoor    Gait Comments  Cues for use of visual targets with gait, cues for increased reciprocal arm swing          Balance Exercises - 10/04/18 1025      Balance Exercises: Standing   Standing Eyes Opened  Wide (BOA);Solid surface;Head turns;5 reps   Head nods, 2 sets    Wall Bumps  Hip    Wall Bumps-Hips  Eyes opened;Anterior/posterior;10 reps   2 sets x 10   Stepping Strategy  Anterior;Posterior;UE support;10 reps    Rockerboard  Anterior/posterior;Lateral   Hip/ankle strategy, alternating UE lifts; EO 10-20 sec   Partial Tandem Stance  Eyes open;Intermittent upper extremity support;5 reps   Head turns, head nods    Turning  Right;Left;5 reps   Review of HEP from last visit   Heel Raises Limitations  2 sets x 10 reps    Toe Raise Limitations  2 sets x 10 reps    Other Standing Exercises  Progressed to turns, carrying cones, 5 ft distance from counter to mat, turning each direction          PT Short Term Goals - 09/23/18 1319      PT SHORT TERM GOAL #1   Title  Pt will be independent with HEP for Parkinson's specific deficits.  TARGET 10/21/2018    Time  4    Period  Weeks    Status  New    Target Date  10/21/18      PT SHORT TERM GOAL #2   Title  Pt will improve 5x sit<>stand to less than or equal to 20 seconds to demonstrate improved transfer efficiency and safety.    Time  4    Period  Weeks    Status  New    Target Date  10/21/18      PT SHORT TERM GOAL #3   Title  Pt will improve TUG  score to less than or equal to 13.5 seconds for decreased fall risk.    Time  4     Period  Weeks    Status  New    Target Date  10/21/18      PT SHORT TERM GOAL #4   Title  Pt will verbalize understanding of fall prevention in the home environment.    Time  4    Period  Weeks    Status  New    Target Date  10/21/18        PT Long Term Goals - 09/23/18 1321      PT LONG TERM GOAL #1   Title  Pt will be independent with progression of HEP for improved balance, posture, gait.  TARGET 11/04/2018    Time  6    Period  Weeks    Status  New    Target Date  11/04/18      PT LONG TERM GOAL #2   Title  Pt will improve DGI to at least 19/24 for decreased fall risk.    Time  6    Period  Weeks    Status  New    Target Date  11/04/18      PT LONG TERM GOAL #3   Title  Pt will improve gait velocity to at least 2.8 ft/sec for improved community ambulator status, improved efficiency/safety with gait.    Time  6    Status  New    Target Date  11/04/18      PT LONG TERM GOAL #4   Title  Pt will verbalize plans for continued community fitness upon d/c.    Time  6    Period  Weeks    Status  New    Target Date  11/04/18            Plan - 10/04/18 2036    Clinical Impression Statement  Reviewed turning techniques and progressed to short distance gait/turns with carrying tasks; no LOB noted.  Pt does need cues for widened BOS at times.  Also worked on hip/ankle/step strategy work, rockerboard and Chiropractor.  Pt able to perform with minimal to no UE support, but does have tendency for posterior weigthshift while on rockerboard.  Pt will continue to benefit from skilled PT to address balance strategies, perturbations, and variable balance challenges to improve balance.    Personal Factors and Comorbidities  Comorbidity 3+    Comorbidities  PMH includes chronic pain, asthma, OA, CAD, GERD, HTN, back surgeries, neck surgery, L RTC repair ; hx of hand pain R, trigger fingers L    Examination-Activity Limitations  Locomotion Level;Transfers     Examination-Participation Restrictions  Community Activity   community fitness   Stability/Clinical Decision Making  Evolving/Moderate complexity    Rehab Potential  Good    PT Frequency  2x / week    PT Duration  6 weeks   plus eval   PT Treatment/Interventions  ADLs/Self Care Home Management;Functional mobility training;Therapeutic activities;Therapeutic exercise;Balance training;Neuromuscular re-education;Gait training;Patient/family education    PT Next Visit Plan  Balance strategy work; corner balance exercises (try foam); variable stepping, balance perturbations.    Consulted and Agree with Plan of Care  Patient       Patient will benefit from skilled therapeutic intervention in order to improve the following deficits and impairments:  Abnormal gait, Decreased balance, Decreased mobility, Decreased strength, Difficulty walking, Postural dysfunction  Visit Diagnosis: Unsteadiness on feet  Other  abnormalities of gait and mobility     Problem List Patient Active Problem List   Diagnosis Date Noted  . Malignant neoplasm of prostate ([place]) 11/01/2017  . Essential tremor 12/06/2015  . PVC (premature ventricular contraction) 09/26/2015  . Embolic stroke involving left middle cerebral artery ([place]) 04/07/2015  . Acute CVA (cerebrovascular accident) ([place]) 03/06/2015  . Stroke (cerebrum) ([place]) 03/05/2015  . CVA (cerebral infarction) 03/05/2015  . Weakness of right upper extremity   . Tremor   . Ejection fraction   . Vertigo   . Sleep apnea   . Palpitations   . Dyslipidemia   . GERD (gastroesophageal reflux disease)   . HTN (hypertension)   . Chest pain   . RBBB (right bundle branch block)   . Ejection fraction   . Carotid artery disease ([place])     Marky Buresh W. 10/04/2018, 8:39 PM Frazier Butt., PT  [facility] [address] [place], [place], [address] Phone: [phone]   Fax:  [phone]  Name: Bobbyjoe Leetch MRN: [id number] Date of Birth: [birth year]

## 2018-10-06 ENCOUNTER — Encounter: Payer: Self-pay | Admitting: Physical Therapy

## 2018-10-06 ENCOUNTER — Other Ambulatory Visit: Payer: Self-pay

## 2018-10-06 ENCOUNTER — Ambulatory Visit: Payer: Medicare HMO | Admitting: Physical Therapy

## 2018-10-06 DIAGNOSIS — R2681 Unsteadiness on feet: Secondary | ICD-10-CM

## 2018-10-06 DIAGNOSIS — R293 Abnormal posture: Secondary | ICD-10-CM | POA: Diagnosis not present

## 2018-10-06 DIAGNOSIS — R2689 Other abnormalities of gait and mobility: Secondary | ICD-10-CM | POA: Diagnosis not present

## 2018-10-06 NOTE — Therapy (Signed)
[facility] [address] [place], [place], [address] Phone: [phone]   Fax:  [phone]  Physical Therapy Treatment  Patient Details  Name: Joshua [name] MRN: [id number] Date of Birth: 1945/07/29 Referring Provider (PT): Alonza Bogus, DO   Encounter Date: 10/06/2018  PT End of Session - 10/06/18 1433    Visit Number  5    Number of Visits  13    Date for PT Re-Evaluation  11/22/18    Authorization Type  [employer] Medicare-will need 10th visit progress note    PT Start Time  1234    PT Stop Time  1314    PT Time Calculation (min)  40 min    Equipment Utilized During Treatment  Gait belt    Activity Tolerance  Patient tolerated treatment well    Behavior During Therapy  [facility] for tasks assessed/performed       Past Medical History:  Diagnosis Date  . Allergic rhinitis   . Allergy   . Anal fissure   . Anxiety    from chronic pain from surgery- on Cymbalta  . Arthritis   . Asthma   . Barrett's esophagus 03/29/2014  . Carotid artery disease ([place])    Carotid Doppler normal August, 2007  . Cataract   . Chest pain    Coronaries normal 1996 /  nuclear..06/2002..normal...EF  56% /  stress echo.. May, 2011.... no  scar or ischemia... rate related RBBB  . Diverticulosis   . Dyslipidemia   . Ejection fraction    EF 60%, stress echo, May, 2011  . GERD (gastroesophageal reflux disease)   . Headache   . History of loop recorder    has since 04/04/15  . HTN (hypertension)    takes Metoprolol for PVC control  . Hx of colonic polyps    adenomatous  . Hx of colonoscopy   . Hyperlipidemia   . IFG (impaired fasting glucose)   . Palpitations    Benign PVCs  . Parkinson disease ([place])   . Prostate cancer ([place])   . RBBB (right bundle branch block)    rate related  . Shingles   . Sleep apnea    pt denies  . Stroke [facility])    TIA  . TIA (transient ischemic attack) 02/2015   Per pt, had 2 strokes  . Tremor    Hand tremor  . Vertigo     Past Surgical History:  Procedure Laterality Date  . BACK SURGERY  2002,2009   x 6  . CHOLECYSTECTOMY  11/30/2012   with IOC  . COLONOSCOPY    . ELECTROPHYSIOLOGIC STUDY N/A 09/26/2015   Procedure: V Tach Ablation (PVC);  Surgeon: Will Meredith Leeds, MD;  Location: [place] CV LAB;  Service: Cardiovascular;  Laterality: N/A;  . EP IMPLANTABLE DEVICE N/A 04/04/2015   Procedure: Loop Recorder Insertion;  Surgeon: Will Meredith Leeds, MD;  Location: [place] CV LAB;  Service: Cardiovascular;  Laterality: N/A;  . HERNIA REPAIR     laprascopic  . KNEE SURGERY     DECEMBER 2017,LEFT KNEE SCOPED  . NECK SURGERY  2002  . POLYPECTOMY    . ROTATOR CUFF REPAIR Left   . TEE WITHOUT CARDIOVERSION N/A 04/04/2015   Procedure: TRANSESOPHAGEAL ECHOCARDIOGRAM (TEE);  Surgeon: Lelon Perla, MD;  Location: [facility] ENDOSCOPY;  Service: Cardiovascular;  Laterality: N/A;  . UPPER GASTROINTESTINAL ENDOSCOPY    . V Tach ablation  09/26/2015    There were no vitals filed for  this visit.  Subjective Assessment - 10/06/18 1235    Subjective  No falls, no changes.  The exercises are making me a little more aware.    Pertinent History  PMH includes multiple back surgeries/fusion neck and lumbar spine (LLE weaker), ?syncopal episodes, arthritis    How long can you stand comfortably?  5 minutes    Patient Stated Goals  Pt's goal for therapy is to improve balance, walking.    Currently in Pain?  Yes    Pain Score  7     Pain Location  Back    Pain Orientation  Left    Pain Descriptors / Indicators  Aching    Pain Type  Chronic pain    Pain Onset  More than a month ago    Pain Frequency  Constant    Aggravating Factors   weather, increased walking distance, extended sitting    Pain Relieving Factors  injection seemed to help, exercises do not bother                       [facility] Adult PT Treatment/Exercise - 10/06/18 1256      Transfers   Transfers  Sit to  Stand;Stand to Sit    Sit to Stand  6: Modified independent (Device/Increase time);Without upper extremity assist;From bed;With armrests;From chair/3-in-1   No UE support from mat, then 5 reps from chair   Number of Reps  10 reps;2 sets   from 24", then higher mat simulating bed height      Cues for correct technique for sit<>stand, use of elevated surface for decreased UE support.   Balance Exercises - 10/06/18 1237      Balance Exercises: Standing   Standing Eyes Opened  Wide (BOA);Solid surface;Head turns;5 reps;Foam/compliant surface   Head nods   Standing Eyes Closed  Wide (BOA);Solid surface;2 reps;10 secs;Foam/compliant surface    SLS with Vectors  Solid surface;Upper extremity assist 1;Other reps (comment)   Alt step taps to 6", 12" steps, x 10 reps   Wall Bumps  Hip    Wall Bumps-Hips  Eyes opened;Anterior/posterior;10 reps   2 sets   Rockerboard  Anterior/posterior;Lateral;EO   Hip/ankle strategy work, UE alt lifts; forward step taps   Partial Tandem Stance  Eyes open;Intermittent upper extremity support;5 reps;Eyes closed;Foam/compliant surface   EC head steady x 10 seconds   Heel Raises Limitations  2 sets x 10 reps   For ankle strategy work   Toe Raise Limitations  2 sets x 10 reps    Other Standing Exercises  Variable direction stepping-forward/side/back with close supervision; 4-square/box step activity (R>back>left>up and then reverse), 3 reps with close supervision/min guard        PT Education - 10/06/18 1432    Education Details  Sit to stand from elevated bed height added to HEP    Person(s) Educated  Patient    Methods  Explanation;Demonstration;Verbal cues;Handout    Comprehension  Verbalized understanding;Returned demonstration;Verbal cues required       PT Short Term Goals - 09/23/18 1319      PT SHORT TERM GOAL #1   Title  Pt will be independent with HEP for Parkinson's specific deficits.  TARGET 10/21/2018    Time  4    Period  Weeks    Status   New    Target Date  10/21/18      PT SHORT TERM GOAL #2   Title  Pt will improve 5x sit<>stand to less  than or equal to 20 seconds to demonstrate improved transfer efficiency and safety.    Time  4    Period  Weeks    Status  New    Target Date  10/21/18      PT SHORT TERM GOAL #3   Title  Pt will improve TUG score to less than or equal to 13.5 seconds for decreased fall risk.    Time  4    Period  Weeks    Status  New    Target Date  10/21/18      PT SHORT TERM GOAL #4   Title  Pt will verbalize understanding of fall prevention in the home environment.    Time  4    Period  Weeks    Status  New    Target Date  10/21/18        PT Long Term Goals - 09/23/18 1321      PT LONG TERM GOAL #1   Title  Pt will be independent with progression of HEP for improved balance, posture, gait.  TARGET 11/04/2018    Time  6    Period  Weeks    Status  New    Target Date  11/04/18      PT LONG TERM GOAL #2   Title  Pt will improve DGI to at least 19/24 for decreased fall risk.    Time  6    Period  Weeks    Status  New    Target Date  11/04/18      PT LONG TERM GOAL #3   Title  Pt will improve gait velocity to at least 2.8 ft/sec for improved community ambulator status, improved efficiency/safety with gait.    Time  6    Status  New    Target Date  11/04/18      PT LONG TERM GOAL #4   Title  Pt will verbalize plans for continued community fitness upon d/c.    Time  6    Period  Weeks    Status  New    Target Date  11/04/18            Plan - 10/06/18 1433    Clinical Impression Statement  Continued work on hip, ankle strategies, as well as varied direction step strategies.  Also addressed  SLS and sit<>stand for functional lower extremity strengthening.  Pt does not c/o additional back pain during PT session.  Pt will continue to benefit from skilled PT to address balance, perturbations and variable balance challenges.    Personal Factors and Comorbidities  Comorbidity  3+    Comorbidities  PMH includes chronic pain, asthma, OA, CAD, GERD, HTN, back surgeries, neck surgery, L RTC repair ; hx of hand pain R, trigger fingers L    Examination-Activity Limitations  Locomotion Level;Transfers    Examination-Participation Restrictions  Community Activity   community fitness   Stability/Clinical Decision Making  Evolving/Moderate complexity    Rehab Potential  Good    PT Frequency  2x / week    PT Duration  6 weeks   plus eval   PT Treatment/Interventions  ADLs/Self Care Home Management;Functional mobility training;Therapeutic activities;Therapeutic exercise;Balance training;Neuromuscular re-education;Gait training;Patient/family education    PT Next Visit Plan  Continue balance strategy work; corner balance exercises (try foam); variable stepping/four square step activities; balance perturbations.  Review sit<>stand addition to HEP    Consulted and Agree with Plan of Care  Patient  Patient will benefit from skilled therapeutic intervention in order to improve the following deficits and impairments:  Abnormal gait, Decreased balance, Decreased mobility, Decreased strength, Difficulty walking, Postural dysfunction  Visit Diagnosis: Unsteadiness on feet     Problem List Patient Active Problem List   Diagnosis Date Noted  . Malignant neoplasm of prostate ([place]) 11/01/2017  . Essential tremor 12/06/2015  . PVC (premature ventricular contraction) 09/26/2015  . Embolic stroke involving left middle cerebral artery ([place]) 04/07/2015  . Acute CVA (cerebrovascular accident) ([place]) 03/06/2015  . Stroke (cerebrum) ([place]) 03/05/2015  . CVA (cerebral infarction) 03/05/2015  . Weakness of right upper extremity   . Tremor   . Ejection fraction   . Vertigo   . Sleep apnea   . Palpitations   . Dyslipidemia   . GERD (gastroesophageal reflux disease)   . HTN (hypertension)   . Chest pain   . RBBB (right bundle branch block)   . Ejection fraction   . Carotid  artery disease ([place])     Bebe Moncure W. 10/06/2018, 2:37 PM  Frazier Butt., PT  [facility] [address] [place], [place], [address] Phone: [phone]   Fax:  [phone]  Name: Joshua [name] MRN: [id number] Date of Birth: [birth year]

## 2018-10-06 NOTE — Patient Instructions (Signed)
Access Code: [email or web address]  URL: [email or web address]  Date: 10/06/2018  Prepared by: Mady Haagensen   Exercises Sit to Stand - 10 reps - 1-2 sets - 1x daily - 5x weekly

## 2018-10-07 DIAGNOSIS — Z01812 Encounter for preprocedural laboratory examination: Secondary | ICD-10-CM | POA: Diagnosis not present

## 2018-10-07 DIAGNOSIS — Z20828 Contact with and (suspected) exposure to other viral communicable diseases: Secondary | ICD-10-CM | POA: Diagnosis not present

## 2018-10-10 DIAGNOSIS — E785 Hyperlipidemia, unspecified: Secondary | ICD-10-CM | POA: Diagnosis not present

## 2018-10-10 DIAGNOSIS — M1990 Unspecified osteoarthritis, unspecified site: Secondary | ICD-10-CM | POA: Diagnosis not present

## 2018-10-10 DIAGNOSIS — R69 Illness, unspecified: Secondary | ICD-10-CM | POA: Diagnosis not present

## 2018-10-10 DIAGNOSIS — G20 Parkinson's disease: Secondary | ICD-10-CM | POA: Diagnosis not present

## 2018-10-10 DIAGNOSIS — Z09 Encounter for follow-up examination after completed treatment for conditions other than malignant neoplasm: Secondary | ICD-10-CM | POA: Diagnosis not present

## 2018-10-10 DIAGNOSIS — I10 Essential (primary) hypertension: Secondary | ICD-10-CM | POA: Diagnosis not present

## 2018-10-10 DIAGNOSIS — Z86010 Personal history of colonic polyps: Secondary | ICD-10-CM | POA: Diagnosis not present

## 2018-10-10 DIAGNOSIS — I493 Ventricular premature depolarization: Secondary | ICD-10-CM | POA: Diagnosis not present

## 2018-10-10 DIAGNOSIS — K2930 Chronic superficial gastritis without bleeding: Secondary | ICD-10-CM | POA: Diagnosis not present

## 2018-10-10 DIAGNOSIS — I4891 Unspecified atrial fibrillation: Secondary | ICD-10-CM | POA: Diagnosis not present

## 2018-10-10 DIAGNOSIS — K2270 Barrett's esophagus without dysplasia: Secondary | ICD-10-CM | POA: Diagnosis not present

## 2018-10-10 DIAGNOSIS — K449 Diaphragmatic hernia without obstruction or gangrene: Secondary | ICD-10-CM | POA: Diagnosis not present

## 2018-10-11 ENCOUNTER — Ambulatory Visit: Payer: Medicare HMO | Admitting: Physical Therapy

## 2018-10-13 ENCOUNTER — Ambulatory Visit: Payer: Medicare HMO | Attending: Neurology | Admitting: Physical Therapy

## 2018-10-13 ENCOUNTER — Other Ambulatory Visit: Payer: Self-pay

## 2018-10-13 ENCOUNTER — Encounter: Payer: Self-pay | Admitting: Physical Therapy

## 2018-10-13 DIAGNOSIS — R2681 Unsteadiness on feet: Secondary | ICD-10-CM | POA: Insufficient documentation

## 2018-10-13 DIAGNOSIS — R2689 Other abnormalities of gait and mobility: Secondary | ICD-10-CM | POA: Insufficient documentation

## 2018-10-13 NOTE — Patient Instructions (Signed)
Handout for multi-directional stepping, 5-6 reps 3-4 times per week

## 2018-10-13 NOTE — Therapy (Signed)
[facility] [address] [place], [place], [address] Phone: [phone]   Fax:  [phone]  Physical Therapy Treatment  Patient Details  Name: Joshua [name] MRN: [id number] Date of Birth: 1945/02/24 Referring Provider (PT): Alonza Bogus, DO   Encounter Date: 10/13/2018  PT End of Session - 10/13/18 1455    Visit Number  6    Number of Visits  13    Date for PT Re-Evaluation  11/22/18    Authorization Type  [employer] Medicare-will need 10th visit progress note    PT Start Time  0935    PT Stop Time  1013    PT Time Calculation (min)  38 min    Equipment Utilized During Treatment  Gait belt    Activity Tolerance  Patient tolerated treatment well    Behavior During Therapy  [facility] for tasks assessed/performed       Past Medical History:  Diagnosis Date  . Allergic rhinitis   . Allergy   . Anal fissure   . Anxiety    from chronic pain from surgery- on Cymbalta  . Arthritis   . Asthma   . Barrett's esophagus 03/29/2014  . Carotid artery disease ([place])    Carotid Doppler normal August, 2007  . Cataract   . Chest pain    Coronaries normal 1996 /  nuclear..06/2002..normal...EF  56% /  stress echo.. May, 2011.... no  scar or ischemia... rate related RBBB  . Diverticulosis   . Dyslipidemia   . Ejection fraction    EF 60%, stress echo, May, 2011  . GERD (gastroesophageal reflux disease)   . Headache   . History of loop recorder    has since 04/04/15  . HTN (hypertension)    takes Metoprolol for PVC control  . Hx of colonic polyps    adenomatous  . Hx of colonoscopy   . Hyperlipidemia   . IFG (impaired fasting glucose)   . Palpitations    Benign PVCs  . Parkinson disease ([place])   . Prostate cancer ([place])   . RBBB (right bundle branch block)    rate related  . Shingles   . Sleep apnea    pt denies  . Stroke [facility])    TIA  . TIA (transient ischemic attack) 02/2015   Per pt, had 2 strokes  . Tremor     Hand tremor  . Vertigo     Past Surgical History:  Procedure Laterality Date  . BACK SURGERY  2002,2009   x 6  . CHOLECYSTECTOMY  11/30/2012   with IOC  . COLONOSCOPY    . ELECTROPHYSIOLOGIC STUDY N/A 09/26/2015   Procedure: V Tach Ablation (PVC);  Surgeon: Will Meredith Leeds, MD;  Location: [facility];  Service: Cardiovascular;  Laterality: N/A;  . EP IMPLANTABLE DEVICE N/A 04/04/2015   Procedure: Loop Recorder Insertion;  Surgeon: Will Meredith Leeds, MD;  Location: [facility];  Service: Cardiovascular;  Laterality: N/A;  . HERNIA REPAIR     laprascopic  . KNEE SURGERY     DECEMBER 2017,LEFT KNEE SCOPED  . NECK SURGERY  2002  . POLYPECTOMY    . ROTATOR CUFF REPAIR Left   . TEE WITHOUT CARDIOVERSION N/A 04/04/2015   Procedure: TRANSESOPHAGEAL ECHOCARDIOGRAM (TEE);  Surgeon: Lelon Perla, MD;  Location: [facility] ENDOSCOPY;  Service: Cardiovascular;  Laterality: N/A;  . UPPER GASTROINTESTINAL ENDOSCOPY    . V Tach ablation  09/26/2015    There were no vitals filed  for this visit.  Subjective Assessment - 10/13/18 0937    Subjective  Wearing my L AFO today, as I can wear it now with being cooler.  Had tests for my esophagus earlier this week and feeling better now.  No falls.    Pertinent History  PMH includes multiple back surgeries/fusion neck and lumbar spine (LLE weaker), ?syncopal episodes, arthritis    How long can you stand comfortably?  5 minutes    Patient Stated Goals  Pt's goal for therapy is to improve balance, walking.    Currently in Pain?  Yes    Pain Score  5     Pain Location  Back    Pain Orientation  Left    Pain Descriptors / Indicators  Aching    Pain Type  Chronic pain    Pain Onset  More than a month ago    Pain Frequency  Constant    Aggravating Factors   increased walking distance, fatigue    Pain Relieving Factors  injection seemed to help, sitting; exercises do not bother.                       [facility]  Treatment/Exercise - 10/13/18 0001      Transfers   Transfers  Sit to Stand;Stand to Sit    Sit to Stand  6: Modified independent (Device/Increase time);Without upper extremity assist;From bed;With armrests;From chair/3-in-1    Stand to Sit  6: Modified independent (Device/Increase time);With upper extremity assist;With armrests;To chair/3-in-1    Number of Reps  Other reps (comment);1 set   5 reps-Review of HEP         Balance Exercises - 10/13/18 0942      Balance Exercises: Standing   Standing Eyes Opened  Wide (BOA);Solid surface;Head turns;5 reps;Foam/compliant surface;Narrow base of support (BOS)   Head nods   Standing Eyes Closed  Wide (BOA);Solid surface;10 secs;Foam/compliant surface;1 rep;Head turns;Narrow base of support (BOS)   Head nods with EC   Wall Bumps  Hip    Wall Bumps-Hips  Eyes opened;Anterior/posterior;10 reps   2 sets in parallel bars   Stepping Strategy  Anterior;Posterior;UE support;10 reps;Lateral;Foam/compliant surface   Forward<>back step and weigthshift, cues for foot clearance   Partial Tandem Stance  Eyes open;Intermittent upper extremity support;5 reps;Eyes closed;Foam/compliant surface   Head turns/nods; then 10 sec EC   Heel Raises Limitations  2 sets x 10 reps    Toe Raise Limitations  2 sets x 10 reps    Other Standing Exercises  Variable direction stepping-forward/side/back with close supervision with added cognitive tasks.  Pt has increased difficulty with correct movement strategies with added cognitive tasks.        PT Education - 10/13/18 1455    Education Details  Handout for multi-directional stepping    Person(s) Educated  Patient    Methods  Explanation;Demonstration;Verbal cues;Handout    Comprehension  Verbalized understanding;Returned demonstration       PT Short Term Goals - 09/23/18 1319      PT SHORT TERM GOAL #1   Title  Pt will be independent with HEP for Parkinson's specific deficits.  TARGET 10/21/2018    Time  4     Period  Weeks    Status  New    Target Date  10/21/18      PT SHORT TERM GOAL #2   Title  Pt will improve 5x sit<>stand to less than or equal to 20 seconds to demonstrate  improved transfer efficiency and safety.    Time  4    Period  Weeks    Status  New    Target Date  10/21/18      PT SHORT TERM GOAL #3   Title  Pt will improve TUG score to less than or equal to 13.5 seconds for decreased fall risk.    Time  4    Period  Weeks    Status  New    Target Date  10/21/18      PT SHORT TERM GOAL #4   Title  Pt will verbalize understanding of fall prevention in the home environment.    Time  4    Period  Weeks    Status  New    Target Date  10/21/18        PT Long Term Goals - 09/23/18 1321      PT LONG TERM GOAL #1   Title  Pt will be independent with progression of HEP for improved balance, posture, gait.  TARGET 11/04/2018    Time  6    Period  Weeks    Status  New    Target Date  11/04/18      PT LONG TERM GOAL #2   Title  Pt will improve DGI to at least 19/24 for decreased fall risk.    Time  6    Period  Weeks    Status  New    Target Date  11/04/18      PT LONG TERM GOAL #3   Title  Pt will improve gait velocity to at least 2.8 ft/sec for improved community ambulator status, improved efficiency/safety with gait.    Time  6    Status  New    Target Date  11/04/18      PT LONG TERM GOAL #4   Title  Pt will verbalize plans for continued community fitness upon d/c.    Time  6    Period  Weeks    Status  New    Target Date  11/04/18            Plan - 10/13/18 1216    Clinical Impression Statement  Skilled PT session today focused on hip, ankle, step strategies, progressing to varied direction stepping with added cognitive challenges.  With added cognitve challenge, pt has increased difficulty with coordination of step strategies.  No overt LOB, but pt needs cues for wider BOS for improved balance in transitions.    Personal Factors and Comorbidities   Comorbidity 3+    Comorbidities  PMH includes chronic pain, asthma, OA, CAD, GERD, HTN, back surgeries, neck surgery, L RTC repair ; hx of hand pain R, trigger fingers L    Examination-Activity Limitations  Locomotion Level;Transfers    Examination-Participation Restrictions  Community Activity   community fitness   Stability/Clinical Decision Making  Evolving/Moderate complexity    Rehab Potential  Good    PT Frequency  2x / week    PT Duration  6 weeks   plus eval   PT Treatment/Interventions  ADLs/Self Care Home Management;Functional mobility training;Therapeutic activities;Therapeutic exercise;Balance training;Neuromuscular re-education;Gait training;Patient/family education    PT Next Visit Plan  Review varied direction stepping, try stepping over obstacles.Continue balance strategy work; Therapist, sports exercises (foam); variable stepping/four square step activities; balance perturbations.    Consulted and Agree with Plan of Care  Patient       Patient will benefit from skilled therapeutic intervention in order to  improve the following deficits and impairments:  Abnormal gait, Decreased balance, Decreased mobility, Decreased strength, Difficulty walking, Postural dysfunction  Visit Diagnosis: Unsteadiness on feet  Other abnormalities of gait and mobility     Problem List Patient Active Problem List   Diagnosis Date Noted  . Malignant neoplasm of prostate ([place]) 11/01/2017  . Essential tremor 12/06/2015  . PVC (premature ventricular contraction) 09/26/2015  . Embolic stroke involving left middle cerebral artery ([place]) 04/07/2015  . Acute CVA (cerebrovascular accident) ([place]) 03/06/2015  . Stroke (cerebrum) ([place]) 03/05/2015  . CVA (cerebral infarction) 03/05/2015  . Weakness of right upper extremity   . Tremor   . Ejection fraction   . Vertigo   . Sleep apnea   . Palpitations   . Dyslipidemia   . GERD (gastroesophageal reflux disease)   . HTN (hypertension)   . Chest pain    . RBBB (right bundle branch block)   . Ejection fraction   . Carotid artery disease ([place])     MARRIOTT,AMY W. 10/13/2018, 3:46 PM Frazier Butt., PT  [facility] [address] [place], [place], [address] Phone: [phone]   Fax:  [phone]  Name: Joshua [name] MRN: [id number] Date of Birth: 04-15-1945

## 2018-10-18 ENCOUNTER — Encounter: Payer: Self-pay | Admitting: Physical Therapy

## 2018-10-18 ENCOUNTER — Other Ambulatory Visit: Payer: Self-pay

## 2018-10-18 ENCOUNTER — Ambulatory Visit: Payer: Medicare HMO | Admitting: Physical Therapy

## 2018-10-18 DIAGNOSIS — R2689 Other abnormalities of gait and mobility: Secondary | ICD-10-CM

## 2018-10-18 DIAGNOSIS — R2681 Unsteadiness on feet: Secondary | ICD-10-CM

## 2018-10-18 NOTE — Patient Instructions (Signed)
1)  PWR Moves FLOW in standing:  (Use your exercise sheet and go from top to bottom), 5 reps  PWR! UP>PWR! Rock>PWR! Twist>PWR! Step    2)   Sit to stand, followed by PWR! Up, then sit down; repeat 3-5 times  Sit to stand, followed by PWR! Rock, then sit down; repeat 3-5 times  Sit to stand followed by [employer]! Rock, then sit down; repeat 3-5 times  Sit to stand followed by [employer]! Step, then sit down; repeat 3-5 times

## 2018-10-19 NOTE — Therapy (Signed)
[facility] [address] [place], [place], [address] Phone: [phone]   Fax:  [phone]  Physical Therapy Treatment  Patient Details  Name: Joshua [name] MRN: [id number] Date of Birth: November 25, 1945 Referring Provider (PT): Alonza Bogus, DO   Encounter Date: 10/18/2018  PT End of Session - 10/19/18 0840    Visit Number  7    Number of Visits  13    Date for PT Re-Evaluation  11/22/18    Authorization Type  [employer] Medicare-will need 10th visit progress note    PT Start Time  1149    PT Stop Time  1229    PT Time Calculation (min)  40 min    Equipment Utilized During Treatment  Gait belt    Activity Tolerance  Patient tolerated treatment well    Behavior During Therapy  [facility] for tasks assessed/performed       Past Medical History:  Diagnosis Date  . Allergic rhinitis   . Allergy   . Anal fissure   . Anxiety    from chronic pain from surgery- on Cymbalta  . Arthritis   . Asthma   . Barrett's esophagus 03/29/2014  . Carotid artery disease ([place])    Carotid Doppler normal August, 2007  . Cataract   . Chest pain    Coronaries normal 1996 /  nuclear..06/2002..normal...EF  56% /  stress echo.. May, 2011.... no  scar or ischemia... rate related RBBB  . Diverticulosis   . Dyslipidemia   . Ejection fraction    EF 60%, stress echo, May, 2011  . GERD (gastroesophageal reflux disease)   . Headache   . History of loop recorder    has since 04/04/15  . HTN (hypertension)    takes Metoprolol for PVC control  . Hx of colonic polyps    adenomatous  . Hx of colonoscopy   . Hyperlipidemia   . IFG (impaired fasting glucose)   . Palpitations    Benign PVCs  . Parkinson disease ([place])   . Prostate cancer ([place])   . RBBB (right bundle branch block)    rate related  . Shingles   . Sleep apnea    pt denies  . Stroke [facility])    TIA  . TIA (transient ischemic attack) 02/2015   Per pt, had 2 strokes  . Tremor     Hand tremor  . Vertigo     Past Surgical History:  Procedure Laterality Date  . BACK SURGERY  2002,2009   x 6  . CHOLECYSTECTOMY  11/30/2012   with IOC  . COLONOSCOPY    . ELECTROPHYSIOLOGIC STUDY N/A 09/26/2015   Procedure: V Tach Ablation (PVC);  Surgeon: Will Meredith Leeds, MD;  Location: [facility];  Service: Cardiovascular;  Laterality: N/A;  . EP IMPLANTABLE DEVICE N/A 04/04/2015   Procedure: Loop Recorder Insertion;  Surgeon: Will Meredith Leeds, MD;  Location: [facility];  Service: Cardiovascular;  Laterality: N/A;  . HERNIA REPAIR     laprascopic  . KNEE SURGERY     DECEMBER 2017,LEFT KNEE SCOPED  . NECK SURGERY  2002  . POLYPECTOMY    . ROTATOR CUFF REPAIR Left   . TEE WITHOUT CARDIOVERSION N/A 04/04/2015   Procedure: TRANSESOPHAGEAL ECHOCARDIOGRAM (TEE);  Surgeon: Lelon Perla, MD;  Location: [facility] ENDOSCOPY;  Service: Cardiovascular;  Laterality: N/A;  . UPPER GASTROINTESTINAL ENDOSCOPY    . V Tach ablation  09/26/2015    There were no vitals filed  for this visit.  Subjective Assessment - 10/18/18 1152    Subjective  Doing about the same.  Been wearing the AFO some at home, but not today.  No falls.  Think the getting up and turning is better than I was.  Still have trouble stepping to the left (more due to the weakness).    Pertinent History  PMH includes multiple back surgeries/fusion neck and lumbar spine (LLE weaker), ?syncopal episodes, arthritis    How long can you stand comfortably?  5 minutes    Patient Stated Goals  Pt's goal for therapy is to improve balance, walking.    Currently in Pain?  Yes    Pain Score  7     Pain Location  Back    Pain Orientation  Left    Pain Descriptors / Indicators  Aching    Pain Onset  More than a month ago    Pain Frequency  Constant    Aggravating Factors   increased walking distance, fatigue    Pain Relieving Factors  sitting, exercises do not bother                       [facility] Adult PT  Treatment/Exercise - 10/18/18 1156      Transfers   Transfers  Sit to Stand;Stand to Sit    Sit to Stand  6: Modified independent (Device/Increase time);Without upper extremity assist;From bed;With armrests;From chair/3-in-1    Five time sit to stand comments   15.3   no UE support   Stand to Sit  6: Modified independent (Device/Increase time);With upper extremity assist;With armrests;To chair/3-in-1      Ambulation/Gait   Ambulation/Gait  Yes    Ambulation/Gait Assistance  6: Modified independent (Device/Increase time)    Gait velocity  10.43 sec = 3.14 ft/sec      Standardized Balance Assessment   Standardized Balance Assessment  Timed Up and Go Test      Timed Up and Go Test   TUG  Normal TUG    Normal TUG (seconds)  11.63        PWR ([facility]) - 10/18/18 1223    PWR! exercises  Moves in standing    PWR! Up  sit<>stand with PWR! Up x 3 reps    PWR! Rock  Sit<>stand with PWR! Rock x 3 reps    PWR! Twist  Sit<>stand with PWR! Twist x 3 reps    PWR Step  Sit<>stand with PWR! Step x 3 reps    Basic 4 Flow  x 3 reps in standing    Comments  Worked on ways that pt can progress/challenge PWR! Moves as HEP       Balance Exercises - 10/19/18 0835      Balance Exercises: Standing   Stepping Strategy  Anterior;Lateral;UE support;5 reps   with obstacle, progressed to variable stepping with obstacle   Other Standing Exercises  Reviewed multi-directional stepping given last visit for HEP.  Pt return demo understanding, performed x 2 sets with intermittent UE support.        Self Care:  Discussed and provided handout for fall prevention education in the home.  PT Education - 10/19/18 [phone]    Education Details  PRogress towards goals, POC (may d/c next visit due to pt's excellent progress); ways to progress current HEP; fall prevention    Person(s) Educated  Patient    Methods  Explanation;Demonstration;Handout    Comprehension  Verbalized understanding;Returned demonstration  PT Short Term Goals - 10/18/18 1158      PT SHORT TERM GOAL #1   Title  Pt will be independent with HEP for Parkinson's specific deficits.  TARGET 10/21/2018    Time  4    Period  Weeks    Status  Achieved    Target Date  10/21/18      PT SHORT TERM GOAL #2   Title  Pt will improve 5x sit<>stand to less than or equal to 20 seconds to demonstrate improved transfer efficiency and safety.    Time  4    Period  Weeks    Status  Achieved    Target Date  10/21/18      PT SHORT TERM GOAL #3   Title  Pt will improve TUG score to less than or equal to 13.5 seconds for decreased fall risk.    Baseline  11.63 sec 10/18/2018    Time  4    Period  Weeks    Status  Achieved    Target Date  10/21/18      PT SHORT TERM GOAL #4   Title  Pt will verbalize understanding of fall prevention in the home environment.    Time  4    Period  Weeks    Status  Achieved    Target Date  10/21/18        PT Long Term Goals - 09/23/18 1321      PT LONG TERM GOAL #1   Title  Pt will be independent with progression of HEP for improved balance, posture, gait.  TARGET 11/04/2018    Time  6    Period  Weeks    Status  New    Target Date  11/04/18      PT LONG TERM GOAL #2   Title  Pt will improve DGI to at least 19/24 for decreased fall risk.    Time  6    Period  Weeks    Status  New    Target Date  11/04/18      PT LONG TERM GOAL #3   Title  Pt will improve gait velocity to at least 2.8 ft/sec for improved community ambulator status, improved efficiency/safety with gait.    Time  6    Status  New    Target Date  11/04/18      PT LONG TERM GOAL #4   Title  Pt will verbalize plans for continued community fitness upon d/c.    Time  6    Period  Weeks    Status  New    Target Date  11/04/18            Plan - 10/19/18 0841    Clinical Impression Statement  Assessed STGs this visit, with pt meeting all STGs.  He has made excellent progress with PT, including improving sit<>stand  transfers without use of hands by 9 seconds in 5x <>stand test.  He has improved TUG score to <13.5 sec, indicating decreased fall risk and he has improved gait velocity and overall ease of mobility.  Focused time today on ways to progress/challenge HEP once therapy has ended.  Plan to check remaining LTGs and d/c next visit.    Personal Factors and Comorbidities  Comorbidity 3+    Comorbidities  PMH includes chronic pain, asthma, OA, CAD, GERD, HTN, back surgeries, neck surgery, L RTC repair ; hx of hand pain R, trigger fingers L    Examination-Activity Limitations  Locomotion Level;Transfers    Examination-Participation Restrictions  Community Activity   community fitness   Stability/Clinical Decision Making  Evolving/Moderate complexity    Rehab Potential  Good    PT Frequency  2x / week    PT Duration  6 weeks   plus eval   PT Treatment/Interventions  ADLs/Self Care Home Management;Functional mobility training;Therapeutic activities;Therapeutic exercise;Balance training;Neuromuscular re-education;Gait training;Patient/family education    PT Next Visit Plan  Check remaining LTGs; plan for discharge next visit    Consulted and Agree with Plan of Care  Patient       Patient will benefit from skilled therapeutic intervention in order to improve the following deficits and impairments:  Abnormal gait, Decreased balance, Decreased mobility, Decreased strength, Difficulty walking, Postural dysfunction  Visit Diagnosis: Unsteadiness on feet  Other abnormalities of gait and mobility     Problem List Patient Active Problem List   Diagnosis Date Noted  . Malignant neoplasm of prostate ([place]) 11/01/2017  . Essential tremor 12/06/2015  . PVC (premature ventricular contraction) 09/26/2015  . Embolic stroke involving left middle cerebral artery ([place]) 04/07/2015  . Acute CVA (cerebrovascular accident) ([place]) 03/06/2015  . Stroke (cerebrum) ([place]) 03/05/2015  . CVA (cerebral infarction) 03/05/2015   . Weakness of right upper extremity   . Tremor   . Ejection fraction   . Vertigo   . Sleep apnea   . Palpitations   . Dyslipidemia   . GERD (gastroesophageal reflux disease)   . HTN (hypertension)   . Chest pain   . RBBB (right bundle branch block)   . Ejection fraction   . Carotid artery disease ([place])     Ciaran Begay W. 10/19/2018, 8:44 AM Frazier Butt., PT  [facility] [address] [place], [place], [address] Phone: [phone]   Fax:  [phone]  Name: Darrnell Kipps MRN: [id number] Date of Birth: Nov 15, 1945

## 2018-10-20 ENCOUNTER — Encounter: Payer: Self-pay | Admitting: Physical Therapy

## 2018-10-20 ENCOUNTER — Other Ambulatory Visit: Payer: Self-pay

## 2018-10-20 ENCOUNTER — Ambulatory Visit: Payer: Medicare HMO | Admitting: Physical Therapy

## 2018-10-20 DIAGNOSIS — R2681 Unsteadiness on feet: Secondary | ICD-10-CM | POA: Diagnosis not present

## 2018-10-20 DIAGNOSIS — R2689 Other abnormalities of gait and mobility: Secondary | ICD-10-CM | POA: Diagnosis not present

## 2018-10-20 NOTE — Therapy (Addendum)
[facility] [address] [place], [place], [address] Phone: [phone]   Fax:  [phone]  Physical Therapy Treatment  Patient Details  Name: Joshua [name] MRN: [id number] Date of Birth: May 11, 1945 Referring Provider (PT): Alonza Bogus, DO   Encounter Date: 10/20/2018  PT End of Session - 10/20/18 1055    Visit Number  8    Number of Visits  13    Date for PT Re-Evaluation  11/22/18    Authorization Type  [employer] Medicare-will need 10th visit progress note    PT Start Time  1018    PT Stop Time  1043   DC session   PT Time Calculation (min)  25 min    Activity Tolerance  Patient tolerated treatment well    Behavior During Therapy  [facility] for tasks assessed/performed       Past Medical History:  Diagnosis Date  . Allergic rhinitis   . Allergy   . Anal fissure   . Anxiety    from chronic pain from surgery- on Cymbalta  . Arthritis   . Asthma   . Barrett's esophagus 03/29/2014  . Carotid artery disease ([place])    Carotid Doppler normal August, 2007  . Cataract   . Chest pain    Coronaries normal 1996 /  nuclear..06/2002..normal...EF  56% /  stress echo.. May, 2011.... no  scar or ischemia... rate related RBBB  . Diverticulosis   . Dyslipidemia   . Ejection fraction    EF 60%, stress echo, May, 2011  . GERD (gastroesophageal reflux disease)   . Headache   . History of loop recorder    has since 04/04/15  . HTN (hypertension)    takes Metoprolol for PVC control  . Hx of colonic polyps    adenomatous  . Hx of colonoscopy   . Hyperlipidemia   . IFG (impaired fasting glucose)   . Palpitations    Benign PVCs  . Parkinson disease ([place])   . Prostate cancer ([place])   . RBBB (right bundle branch block)    rate related  . Shingles   . Sleep apnea    pt denies  . Stroke [facility])    TIA  . TIA (transient ischemic attack) 02/2015   Per pt, had 2 strokes  . Tremor    Hand tremor  . Vertigo     Past  Surgical History:  Procedure Laterality Date  . BACK SURGERY  2002,2009   x 6  . CHOLECYSTECTOMY  11/30/2012   with IOC  . COLONOSCOPY    . ELECTROPHYSIOLOGIC STUDY N/A 09/26/2015   Procedure: V Tach Ablation (PVC);  Surgeon: Will Meredith Leeds, MD;  Location: [place] CV LAB;  Service: Cardiovascular;  Laterality: N/A;  . EP IMPLANTABLE DEVICE N/A 04/04/2015   Procedure: Loop Recorder Insertion;  Surgeon: Will Meredith Leeds, MD;  Location: [place] CV LAB;  Service: Cardiovascular;  Laterality: N/A;  . HERNIA REPAIR     laprascopic  . KNEE SURGERY     DECEMBER 2017,LEFT KNEE SCOPED  . NECK SURGERY  2002  . POLYPECTOMY    . ROTATOR CUFF REPAIR Left   . TEE WITHOUT CARDIOVERSION N/A 04/04/2015   Procedure: TRANSESOPHAGEAL ECHOCARDIOGRAM (TEE);  Surgeon: Lelon Perla, MD;  Location: [facility] ENDOSCOPY;  Service: Cardiovascular;  Laterality: N/A;  . UPPER GASTROINTESTINAL ENDOSCOPY    . V Tach ablation  09/26/2015    There were no vitals filed for this visit.  Subjective Assessment -  10/20/18 1021    Subjective  No changes, doing the exercises.    Pertinent History  PMH includes multiple back surgeries/fusion neck and lumbar spine (LLE weaker), ?syncopal episodes, arthritis    How long can you stand comfortably?  5 minutes    Patient Stated Goals  Pt's goal for therapy is to improve balance, walking.    Currently in Pain?  Yes    Pain Score  7     Pain Location  Back    Pain Orientation  Left    Pain Descriptors / Indicators  Aching    Pain Onset  More than a month ago    Pain Frequency  Constant    Aggravating Factors   increased walking distance, fatigue    Pain Relieving Factors  sitting, exercises do not bother    Multiple Pain Sites  Yes    Pain Score  7    Pain Location  Hand    Pain Orientation  Right    Pain Descriptors / Indicators  Aching    Pain Type  Acute pain    Pain Onset  In the past 7 days    Pain Frequency  Constant    Aggravating Factors   grandson  kicked football and accidentally hit hand    Pain Relieving Factors  heat/ice                Neuro Re-education: REviewed PWR! Moves progression of HEP, given last visit, with pt return demo understanding. PWR! exercises  Moves in standing     PWR! Up  sit<>stand with PWR! Up x 3 reps    PWR! Rock  Sit<>stand with PWR! Rock x 3 reps    PWR! Twist  Sit<>stand with PWR! Twist x 3 reps    PWR Step  Sit<>stand with PWR! Step x 3 reps    Basic 4 Flow  x 3 reps in standing    Comments  Worked on ways that pt can progress/challenge PWR! Moves as HEP              [facility] Adult PT Treatment/Exercise - 10/20/18 0001      Standardized Balance Assessment   Standardized Balance Assessment  Dynamic Gait Index      Dynamic Gait Index   Level Surface  Normal    Change in Gait Speed  Mild Impairment    Gait with Horizontal Head Turns  Mild Impairment    Gait with Vertical Head Turns  Mild Impairment    Gait and Pivot Turn  Normal    Step Over Obstacle  Mild Impairment    Step Around Obstacles  Mild Impairment    Steps  Mild Impairment    Total Score  18      Self-Care   Self-Care  Other Self-Care Comments    Other Self-Care Comments   Discussed progress towards goals, plans for continued fitness upon d/c from PT; Pt describes back exercises for back flexibility that he performs.  Discussed walking for exercise and possible return to ACT.  Discusse plans for d/c this visit. Pt agrees to return PT eval in 6-9 months.             PT Education - 10/20/18 1054    Education Details  continued fitness program, plans for d/c this visit and return eval in 6 months.    Person(s) Educated  Patient    Methods  Explanation    Comprehension  Verbalized understanding  PT Short Term Goals - 10/18/18 1158      PT SHORT TERM GOAL #1   Title  Pt will be independent with HEP for Parkinson's specific deficits.  TARGET 10/21/2018    Time  4    Period  Weeks    Status   Achieved    Target Date  10/21/18      PT SHORT TERM GOAL #2   Title  Pt will improve 5x sit<>stand to less than or equal to 20 seconds to demonstrate improved transfer efficiency and safety.    Time  4    Period  Weeks    Status  Achieved    Target Date  10/21/18      PT SHORT TERM GOAL #3   Title  Pt will improve TUG score to less than or equal to 13.5 seconds for decreased fall risk.    Baseline  11.63 sec 10/18/2018    Time  4    Period  Weeks    Status  Achieved    Target Date  10/21/18      PT SHORT TERM GOAL #4   Title  Pt will verbalize understanding of fall prevention in the home environment.    Time  4    Period  Weeks    Status  Achieved    Target Date  10/21/18        PT Long Term Goals - 10/20/18 1024      PT LONG TERM GOAL #1   Title  Pt will be independent with progression of HEP for improved balance, posture, gait.  TARGET 11/04/2018    Time  6    Period  Weeks    Status  Achieved      PT LONG TERM GOAL #2   Title  Pt will improve DGI to at least 19/24 for decreased fall risk.    Baseline  18/24 10/20/2018    Time  6    Period  Weeks    Status  Partially Met      PT LONG TERM GOAL #3   Title  Pt will improve gait velocity to at least 2.8 ft/sec for improved community ambulator status, improved efficiency/safety with gait.    Time  6    Status  Achieved      PT LONG TERM GOAL #4   Title  Pt will verbalize plans for continued community fitness upon d/c.    Time  6    Period  Weeks    Status  Achieved            Plan - 10/20/18 1056    Clinical Impression Statement  Pt has met 3 of 4 LTGs.  Pt has made excellent progress during course of PT and verbalizes understanding of continuing HEP once therapy d/c'd.  Pt is appropriate for d/c this visit.    Personal Factors and Comorbidities  Comorbidity 3+    Comorbidities  PMH includes chronic pain, asthma, OA, CAD, GERD, HTN, back surgeries, neck surgery, L RTC repair ; hx of hand pain R, trigger  fingers L    Examination-Activity Limitations  Locomotion Level;Transfers    Examination-Participation Restrictions  Community Activity   community fitness   Stability/Clinical Decision Making  Evolving/Moderate complexity    Rehab Potential  Good    PT Frequency  2x / week    PT Duration  6 weeks   plus eval   PT Treatment/Interventions  ADLs/Self Care Home Management;Functional mobility training;Therapeutic activities;Therapeutic exercise;Balance  training;Neuromuscular re-education;Gait training;Patient/family education    PT Next Visit Plan  Discharge this visit; pt agrees to return PT eval in 6-9 months    Consulted and Agree with Plan of Care  Patient       Patient will benefit from skilled therapeutic intervention in order to improve the following deficits and impairments:  Abnormal gait, Decreased balance, Decreased mobility, Decreased strength, Difficulty walking, Postural dysfunction  Visit Diagnosis: Other abnormalities of gait and mobility  Unsteadiness on feet     Problem List Patient Active Problem List   Diagnosis Date Noted  . Malignant neoplasm of prostate ([place]) 11/01/2017  . Essential tremor 12/06/2015  . PVC (premature ventricular contraction) 09/26/2015  . Embolic stroke involving left middle cerebral artery ([place]) 04/07/2015  . Acute CVA (cerebrovascular accident) ([place]) 03/06/2015  . Stroke (cerebrum) ([place]) 03/05/2015  . CVA (cerebral infarction) 03/05/2015  . Weakness of right upper extremity   . Tremor   . Ejection fraction   . Vertigo   . Sleep apnea   . Palpitations   . Dyslipidemia   . GERD (gastroesophageal reflux disease)   . HTN (hypertension)   . Chest pain   . RBBB (right bundle branch block)   . Ejection fraction   . Carotid artery disease ([place])     Joshua [name] W. 10/20/2018, 10:58 AM  Mady Haagensen, PT 10/20/18 10:58 AM Phone: [phone] Fax: [place] [address] [place], [place], [address] Phone: [phone]   Fax:  [phone]  Name: Miking Usrey MRN: [id number] Date of Birth: October 21, 1945   PHYSICAL THERAPY DISCHARGE SUMMARY  Visits from Start of Care: 8  Current functional level related to goals / functional outcomes: PT Long Term Goals - 10/20/18 1024      PT LONG TERM GOAL #1   Title  Pt will be independent with progression of HEP for improved balance, posture, gait.  TARGET 11/04/2018    Time  6    Period  Weeks    Status  Achieved      PT LONG TERM GOAL #2   Title  Pt will improve DGI to at least 19/24 for decreased fall risk.    Baseline  18/24 10/20/2018    Time  6    Period  Weeks    Status  Partially Met      PT LONG TERM GOAL #3   Title  Pt will improve gait velocity to at least 2.8 ft/sec for improved community ambulator status, improved efficiency/safety with gait.    Time  6    Status  Achieved      PT LONG TERM GOAL #4   Title  Pt will verbalize plans for continued community fitness upon d/c.    Time  6    Period  Weeks    Status  Achieved      Pt has met 3 of 4 LTGs (LTG partially met for improved DGI to 18/24 from 15/24, just not improved to goal level).  Pt met all STGs.   Remaining deficits: LLE instability weakness due to back pain; tremors, balance   Education / Equipment: Pt has been educated in HEP, fall prevention, continued fitness upon d/c from PT.  Plan: Patient agrees to discharge.  Patient goals were met. Patient is being discharged due to meeting the stated rehab goals.  ?????Pt will benefit from return PT eval in 6-9 months due to degenerative nature of disease process.  Mady Haagensen, PT 10/20/18 11:02 AM Phone: [phone] Fax: [phone]

## 2018-10-21 DIAGNOSIS — H5111 Convergence insufficiency: Secondary | ICD-10-CM | POA: Diagnosis not present

## 2018-10-21 DIAGNOSIS — H524 Presbyopia: Secondary | ICD-10-CM | POA: Diagnosis not present

## 2018-11-04 ENCOUNTER — Ambulatory Visit: Payer: Medicare HMO | Admitting: Physical Therapy

## 2018-11-15 ENCOUNTER — Ambulatory Visit: Payer: Medicare HMO | Admitting: Physical Therapy

## 2018-11-17 ENCOUNTER — Ambulatory Visit: Payer: Medicare HMO | Admitting: Physical Therapy

## 2018-11-24 ENCOUNTER — Other Ambulatory Visit: Payer: Self-pay

## 2018-11-24 ENCOUNTER — Ambulatory Visit (INDEPENDENT_AMBULATORY_CARE_PROVIDER_SITE_OTHER): Payer: Medicare HMO | Admitting: Cardiology

## 2018-11-24 ENCOUNTER — Encounter: Payer: Self-pay | Admitting: Cardiology

## 2018-11-24 VITALS — BP 128/80 | HR 68 | Ht 73.0 in | Wt 224.0 lb

## 2018-11-24 DIAGNOSIS — C61 Malignant neoplasm of prostate: Secondary | ICD-10-CM | POA: Diagnosis not present

## 2018-11-24 DIAGNOSIS — I493 Ventricular premature depolarization: Secondary | ICD-10-CM | POA: Diagnosis not present

## 2018-11-24 NOTE — Patient Instructions (Signed)
Medication Instructions:  Your physician recommends that you continue on your current medications as directed. Please refer to the Current Medication list given to you today.  * If you need a refill on your cardiac medications before your next appointment, please call your pharmacy.   Labwork: None ordered  Testing/Procedures: None ordered  Follow-Up: The nurse will call you to arrange loop recorder to be taken out in the office.  At [facility], you and your health needs are our priority.  As part of our continuing mission to provide you with exceptional heart care, we have created designated Provider Care Teams.  These Care Teams include your primary Cardiologist (physician) and Advanced Practice Providers (APPs -  Physician Assistants and Nurse Practitioners) who all work together to provide you with the care you need, when you need it.  You will need a follow up appointment in 1 year.  Please call our office 2 months in advance to schedule this appointment.  You may see Dr Curt Bears or one of the following Advanced Practice Providers on your designated Care Team:    Chanetta Marshall, NP  Tommye Standard, PA-C  Oda Kilts, [place]   Thank you for choosing [facility]   Trinidad Curet, RN [phone]

## 2018-11-24 NOTE — Progress Notes (Signed)
Electrophysiology Office Note   Date:  11/24/2018   ID:  Cassel, Jarecki 24-Nov-1945, MRN [id number]  PCP:  Marton Redwood, MD  Primary Electrophysiologist:  Constance Haw, MD    No chief complaint on file.    History of Present Illness: Joshua [name] is a 73 y.o. male who presents today for electrophysiology evaluation.   He has a history of CVA, OSA, HLD, GERD, HTN, RBBB.  His stroke occurred 02/2015.  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  A linq monitor was implanted. He was found to have an excessive number of PVCs, up to 26% on Holter monitor. He had ablation of his PVCs with return of PVCs post ablation and was placed on flecainide. He was previously on metoprolol which was switched to propranolol due to an essential tremor.    Today, denies symptoms of palpitations, chest pain, shortness of breath, orthopnea, PND, lower extremity edema, claudication, dizziness, presyncope, syncope, bleeding, or neurologic sequela. The patient is tolerating medications without difficulties.  Overall he is doing well.  He has no chest pain or shortness of breath.  He is unfortunately dealing with his Parkinson's.  He has had 3 falls over the last year.  He is currently undergoing therapy to help improve his balance.   Past Medical History:  Diagnosis Date  . Allergic rhinitis   . Allergy   . Anal fissure   . Anxiety    from chronic pain from surgery- on Cymbalta  . Arthritis   . Asthma   . Barrett's esophagus 03/29/2014  . Carotid artery disease ([place])    Carotid Doppler normal August, 2007  . Cataract   . Chest pain    Coronaries normal 1996 /  nuclear..06/2002..normal...EF  56% /  stress echo.. May, 2011.... no  scar or ischemia... rate related RBBB  . Diverticulosis   . Dyslipidemia   . Ejection fraction    EF 60%, stress echo, May, 2011  . GERD (gastroesophageal reflux disease)   .  Headache   . History of loop recorder    has since 04/04/15  . HTN (hypertension)    takes Metoprolol for PVC control  . Hx of colonic polyps    adenomatous  . Hx of colonoscopy   . Hyperlipidemia   . IFG (impaired fasting glucose)   . Palpitations    Benign PVCs  . Parkinson disease ([place])   . Prostate cancer ([place])   . RBBB (right bundle branch block)    rate related  . Shingles   . Sleep apnea    pt denies  . Stroke [facility])    TIA  . TIA (transient ischemic attack) 02/2015   Per pt, had 2 strokes  . Tremor    Hand tremor  . Vertigo    Past Surgical History:  Procedure Laterality Date  . BACK SURGERY  2002,2009   x 6  . CHOLECYSTECTOMY  11/30/2012   with IOC  . COLONOSCOPY    . ELECTROPHYSIOLOGIC STUDY N/A 09/26/2015   Procedure: V Tach Ablation (PVC);  Surgeon: Nilda Keathley Meredith Leeds, MD;  Location: [place] CV LAB;  Service: Cardiovascular;  Laterality: N/A;  . EP IMPLANTABLE DEVICE N/A 04/04/2015   Procedure: Loop Recorder Insertion;  Surgeon: Bobbette Eakes Meredith Leeds, MD;  Location: [place] CV LAB;  Service: Cardiovascular;  Laterality: N/A;  . HERNIA REPAIR     laprascopic  .  KNEE SURGERY     DECEMBER 2017,LEFT KNEE SCOPED  . NECK SURGERY  2002  . POLYPECTOMY    . ROTATOR CUFF REPAIR Left   . TEE WITHOUT CARDIOVERSION N/A 04/04/2015   Procedure: TRANSESOPHAGEAL ECHOCARDIOGRAM (TEE);  Surgeon: Lelon Perla, MD;  Location: [facility] ENDOSCOPY;  Service: Cardiovascular;  Laterality: N/A;  . UPPER GASTROINTESTINAL ENDOSCOPY    . V Tach ablation  09/26/2015     Current Outpatient Medications  Medication Sig Dispense Refill  . albuterol (PROVENTIL HFA;VENTOLIN HFA) 108 (90 Base) MCG/ACT inhaler Inhale 2 puffs into the lungs every 4 (four) hours as needed for wheezing. 1 Inhaler 0  . carbidopa-levodopa (SINEMET IR) 25-100 MG tablet Take 1 tablet by mouth 4 (four) times daily. 360 tablet 1  . clopidogrel (PLAVIX) 75 MG tablet Take 1 tablet (75 mg total) by mouth daily. 30  tablet 2  . DYMISTA 137-50 MCG/ACT SUSP Place 1 puff into both nostrils at bedtime.     [name] escitalopram (LEXAPRO) 10 MG tablet TAKE 1 TABLET BY MOUTH EVERY DAY (TO REPLACE DULOXETINE)    . finasteride (PROSCAR) 5 MG tablet Take 5 mg by mouth Daily.    . flecainide (TAMBOCOR) 150 MG tablet TAKE 1/2 TABLET BY MOUTH EVERY 12 HOURS 90 tablet 2  . gabapentin (NEURONTIN) 100 MG capsule TAKE 1 CAPSULE BY MOUTH EVERY 8 HOURS AS NEEDED FOR PAIN    . levocetirizine (XYZAL) 5 MG tablet Take 5 mg by mouth every evening.      . methocarbamol (ROBAXIN) 500 MG tablet TAKE 1 TABLET (500 MG TOTAL) BY MOUTH FOUR (4) TIMES A DAY.  0  . montelukast (SINGULAIR) 10 MG tablet Take 10 mg by mouth at bedtime.      [name] PROCTOSOL HC 2.5 % rectal cream APPLY INTO AND AROUND RECTUM 2 TIMES A DAY 28.35 g 0  . propranolol (INDERAL) 20 MG tablet Take 20 mg by mouth 2 (two) times daily.    . rosuvastatin (CRESTOR) 10 MG tablet Take 5 mg by mouth daily. Take 1/2 tablet daily    . tamsulosin (FLOMAX) 0.4 MG CAPS capsule Take 0.4 mg by mouth daily after supper.     . traMADol (ULTRAM) 50 MG tablet Take 50 mg by mouth every 6 (six) hours as needed. for pain  0  . zolpidem (AMBIEN) 10 MG tablet Take 10 mg by mouth at bedtime.     No current facility-administered medications for this visit.     Allergies:   Floxin i.v. in dextrose 5% [ofloxacin] and Terazosin   Social History:  The patient  reports that he has never smoked. He has never used smokeless tobacco. He reports that he does not drink alcohol or use drugs.   Family History:  The patient's family history includes Breast cancer in his paternal grandmother; Clotting disorder in his father; Healthy in his daughter and daughter; Heart attack in his father and paternal grandfather; Heart disease in his brother and father; Hypertension in his mother.    ROS:  Please see the history of present illness.   Otherwise, review of systems is positive for none.   All other systems are  reviewed and negative.   PHYSICAL EXAM: VS:  BP 128/80   Pulse 68   Ht 6\' 1"  (1.854 m)   Wt 224 lb (101.6 kg)   SpO2 (!) 89%   BMI 29.55 kg/m  , BMI Body mass index is 29.55 kg/m. GEN: Well nourished, well developed, in no acute distress  HEENT: normal  Neck: no JVD, carotid bruits, or masses Cardiac: RRR; no murmurs, rubs, or gallops,no edema  Respiratory:  clear to auscultation bilaterally, normal work of breathing GI: soft, nontender, nondistended, + BS MS: no deformity or atrophy  Skin: warm and dry Neuro:  Strength and sensation are intact Psych: euthymic mood, full affect  EKG:  EKG is ordered today. Personal review of the ekg ordered shows sinus rhythm, PVCs  Personal review of the device interrogation today. Results in [place]: No results found for requested labs within last 8760 hours.    Lipid Panel     Component Value Date/Time   CHOL 137 03/06/2015 0550   TRIG 105 03/06/2015 0550   HDL 41 03/06/2015 0550   CHOLHDL 3.3 03/06/2015 0550   VLDL 21 03/06/2015 0550   LDLCALC 75 03/06/2015 0550     Wt Readings from Last 3 Encounters:  11/24/18 224 lb (101.6 kg)  09/02/18 225 lb (102.1 kg)  04/06/18 231 lb (104.8 kg)      Other studies Reviewed: Additional studies/ records that were reviewed today include: TEE 04/04/15  Review of the above records today demonstrates:  - Left ventricle: Systolic function was normal. The estimated  ejection fraction was in the range of 50% to 55%. Wall motion was  normal; there were no regional wall motion abnormalities. - Aortic valve: No evidence of vegetation. There was trivial  regurgitation. - Mitral valve: No evidence of vegetation. There was mild  regurgitation. - Left atrium: The atrium was mildly dilated. No evidence of  thrombus in the atrial cavity or appendage. - Right atrium: No evidence of thrombus in the atrial cavity or  appendage. - Atrial septum: There was a patent foramen  ovale. - Tricuspid valve: No evidence of vegetation. - Pulmonic valve: No evidence of vegetation.  Holter 06/26/15 Average HR: 88 bpm Minimum HR: 56 bpm Maximum HR: 128 bpm  26% PVCs  ASSESSMENT AND PLAN:  1.  PVCs: Status post ablation with intermittent suppression.  Currently on flecainide with reduced burden.  No changes.  2. Hypertension: Currently well controlled.  No changes.  3.  Cryptogenic stroke: Linq monitor battery end-of-life.  He would like.  We Tesla Keeler bring him back for explant.  Current medicines are reviewed at length with the patient today.   The patient does not have concerns regarding his medicines.  The following changes were made today: None  Labs/ tests ordered today include:  Orders Placed This Encounter  Procedures  . EKG 12-Lead     Disposition:   FU with Taytum Scheck 12 months   Signed, Filippo Puls Meredith Leeds, MD  11/24/2018 2:48 PM     [facility] [address] [place] [address] [phone] (office) [phone] (fax)

## 2018-12-01 ENCOUNTER — Other Ambulatory Visit: Payer: Self-pay | Admitting: Neurology

## 2018-12-01 DIAGNOSIS — C61 Malignant neoplasm of prostate: Secondary | ICD-10-CM | POA: Diagnosis not present

## 2019-02-09 ENCOUNTER — Encounter: Payer: Self-pay | Admitting: Neurology

## 2019-02-09 NOTE — Progress Notes (Signed)
Virtual Visit via Video Note The purpose of this virtual visit is to provide medical care while limiting exposure to the novel coronavirus.    Consent was obtained for video visit:  Yes.   Answered questions that patient had about telehealth interaction:  Yes.   I discussed the limitations, risks, security and privacy concerns of performing an evaluation and management service by telemedicine. I also discussed with the patient that there may be a patient responsible charge related to this service. The patient expressed understanding and agreed to proceed.  Pt location: Home Physician Location: office Name of referring provider:  Marton Redwood, MD I connected with Joshua [name] at patients initiation/request on 02/10/2019 at  1:00 PM EST by video enabled telemedicine application and verified that I am speaking with the correct person using two identifiers. Pt MRN:  [id number] Pt DOB:  01/23/1945 Video Participants:  Joshua [name];  Wife supplements hx   History of Present Illness:  Patient seen today in follow-up for parkinsonism, as well as a history of syncope.  A carotid ultrasound was done since our last visit.  There was no evidence of hemodynamically significant stenosis.  Reviewed records from cardiology.  He saw Dr. Curt Bears on November 12.  Interestingly, it states that the patient denied any syncope since their last visit, which was not the case (was the reason for the carotid ultrasound).  Patient remains on flecainide for frequent PVCs, status post ablation.    He is on carbidopa/levodopa 25/100 qid.  Artane was d/c.  He restarted the last few weeks once a day because of AM tremor.  He also notes it when fatigued.  He is more bent over when walking. Thinks that some of that is due to back pain.  Saw Dr. Valetta Close and no change in eyes.    Current movement d/o meds: carbidopa/levodopa 25/100 qid (8:30am/noon/5pm/bed)   Prior meds:  Artane; pramipexole (dizzy)     Current Outpatient Medications on File Prior to Visit  Medication Sig Dispense Refill  . albuterol (PROVENTIL HFA;VENTOLIN HFA) 108 (90 Base) MCG/ACT inhaler Inhale 2 puffs into the lungs every 4 (four) hours as needed for wheezing. 1 Inhaler 0  . carbidopa-levodopa (SINEMET IR) 25-100 MG tablet Take 1 tablet by mouth 4 (four) times daily. 360 tablet 1  . clopidogrel (PLAVIX) 75 MG tablet Take 1 tablet (75 mg total) by mouth daily. 30 tablet 2  . DYMISTA 137-50 MCG/ACT SUSP Place 1 puff into both nostrils at bedtime.     [name] escitalopram (LEXAPRO) 10 MG tablet TAKE 1 TABLET BY MOUTH EVERY DAY (TO REPLACE DULOXETINE)    . finasteride (PROSCAR) 5 MG tablet Take 5 mg by mouth Daily.    . flecainide (TAMBOCOR) 150 MG tablet TAKE 1/2 TABLET BY MOUTH EVERY 12 HOURS 90 tablet 2  . gabapentin (NEURONTIN) 100 MG capsule TAKE 1 CAPSULE BY MOUTH EVERY 8 HOURS AS NEEDED FOR PAIN    . levocetirizine (XYZAL) 5 MG tablet Take 5 mg by mouth every evening.      . methocarbamol (ROBAXIN) 500 MG tablet TAKE 1 TABLET (500 MG TOTAL) BY MOUTH FOUR (4) TIMES A DAY.  0  . montelukast (SINGULAIR) 10 MG tablet Take 10 mg by mouth at bedtime.      [name] PROCTOSOL HC 2.5 % rectal cream APPLY INTO AND AROUND RECTUM 2 TIMES A DAY 28.35 g 0  . propranolol (INDERAL) 20 MG tablet Take 20 mg by mouth 2 (two) times daily.    [name]  rosuvastatin (CRESTOR) 10 MG tablet Take 5 mg by mouth daily. Take 1/2 tablet daily    . tamsulosin (FLOMAX) 0.4 MG CAPS capsule Take 0.4 mg by mouth daily after supper.     . traMADol (ULTRAM) 50 MG tablet Take 50 mg by mouth every 6 (six) hours as needed. for pain  0  . trihexyphenidyl (ARTANE) 2 MG tablet TAKE 1 TABLET BY MOUTH EVERY MORNING 90 tablet 1  . zolpidem (AMBIEN) 10 MG tablet Take 10 mg by mouth at bedtime.     No current facility-administered medications on file prior to visit.     Observations/Objective:   There were no vitals filed for this visit. GEN:  The patient appears stated age and  is in NAD.  Neurological examination:  Orientation: The patient is alert and oriented x3. Cranial nerves: There is good facial symmetry. There is facial hypomimia.  The speech is fluent and clear. Soft palate rises symmetrically and there is no tongue deviation. Hearing is intact to conversational tone. Motor: Strength is at least antigravity x 4.   Shoulder shrug is equal and symmetric.  There is no pronator drift.  Movement examination: Tone: unable Abnormal movements: there is postural tremor.  There is rest tremor with ambulation on the right hahnd Coordination:  There is mild decremation with RAM's Gait and Station: The patient has no difficulty arising out of a deep-seated chair without the use of the hands. The patient's stride length is good with stooped posture.      Assessment and Plan:   1.  Idiopathic Parkinson's disease, with levodopa resistant tremor - ET/PD is in the ddx.I have suspected a possible atypical state, but have not really seen that progress. 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  -DaT scan positive for loss of DA in bilateral putamen in 08/2016.              -He has had a levodopa challenge test in the past that did not show a significant benefit to levodopa, but his primary issue at that time was tremor.  He has since developed slowness of movement.               -change timing of carbidopa/levodopa 25/100, so that he is taking 1 tablet at 7 AM/11 AM/3 PM/7 PM.  Discussed timing of food with protein  -add carbidopa/levodopa 50/200 at bedtime  -discussed online cycle class and online RSB 2. Cerebral infarct ([place]) 02/2015 - Now has loop recorder. TEE with PFO. No indication to close given age and RoPE score of 5. Carotid u/s normal. LDL 75 at time of event. On plavix. He asked me about changing to ASA but he had stroke on this so would recommend staying with the Plavix.   3.  Syncope -as above, multiple episodes.   I'm still worried that this is orthostatic but cardiology didn't think that related to propranolol.  I told pt to keep track of his blood pressures.  has seen cardiology but doesn't sound like they discussed last episode.  Follow Up Instructions:    -I discussed the assessment and treatment plan with the patient. The patient was provided an opportunity to ask questions and all were answered. The patient agreed with the plan and demonstrated an understanding of the instructions.   The patient was advised to call back or seek an in-person evaluation if the symptoms worsen or if the condition fails to improve as anticipated.    Total time spent on  today's visit was 30 minutes, including both face-to-face time and nonface-to-face time.  Time included that spent on review of records (prior notes available to me/labs/imaging if pertinent), discussing treatment and goals, answering patient's questions and coordinating care.   Alonza Bogus, DO

## 2019-02-10 ENCOUNTER — Other Ambulatory Visit: Payer: Self-pay

## 2019-02-10 ENCOUNTER — Telehealth (INDEPENDENT_AMBULATORY_CARE_PROVIDER_SITE_OTHER): Payer: Medicare HMO | Admitting: Neurology

## 2019-02-10 DIAGNOSIS — G20 Parkinson's disease: Secondary | ICD-10-CM | POA: Diagnosis not present

## 2019-02-10 DIAGNOSIS — R55 Syncope and collapse: Secondary | ICD-10-CM | POA: Diagnosis not present

## 2019-02-10 MED ORDER — CARBIDOPA-LEVODOPA ER 50-200 MG PO TBCR: 1.0000 | EXTENDED_RELEASE_TABLET | Freq: Every day | ORAL | 1 refills | Status: DC

## 2019-02-10 MED ORDER — CARBIDOPA-LEVODOPA 25-100 MG PO TABS: 1.0000 | ORAL_TABLET | Freq: Four times a day (QID) | ORAL | 1 refills | Status: DC

## 2019-02-20 ENCOUNTER — Ambulatory Visit: Payer: Medicare HMO | Attending: Internal Medicine

## 2019-02-20 DIAGNOSIS — Z23 Encounter for immunization: Secondary | ICD-10-CM

## 2019-02-20 NOTE — Progress Notes (Signed)
   Covid-19 Vaccination Clinic  Name:  Joshua [name]    MRN: [id number] DOB: December 15, 1945  02/20/2019  [name] was observed post Covid-19 immunization for 30 minutes based on pre-vaccination screening without incidence. He was provided with Vaccine Information Sheet and instruction to access the V-Safe system.   [name] was instructed to call 911 with any severe reactions post vaccine: [name] Difficulty breathing  . Swelling of your face and throat  . A fast heartbeat  . A bad rash all over your body  . Dizziness and weakness    Immunizations Administered    Name Date Dose VIS Date Route   Pfizer COVID-19 Vaccine 02/20/2019  5:20 PM 0.3 mL 12/23/2018 Intramuscular   Manufacturer: [place]   Lot: [id number]   [place]: [id number]

## 2019-05-08 ENCOUNTER — Telehealth: Payer: Self-pay | Admitting: Physical Therapy

## 2019-05-08 DIAGNOSIS — G20 Parkinson's disease: Secondary | ICD-10-CM

## 2019-05-08 NOTE — Telephone Encounter (Signed)
done

## 2019-05-08 NOTE — Telephone Encounter (Signed)
Hello, Mr. Mcgarey is scheduled for a return PT eval in early May, as was agreed upon at his previous PT discharge.  Could you please write an order in Epic for PT eval and treat for this evaluation?  Thank you.  Mady Haagensen, PT 05/08/19 7:31 AM Phone: [phone] Fax: [phone]

## 2019-05-16 ENCOUNTER — Ambulatory Visit: Payer: Medicare HMO | Attending: Neurology | Admitting: Physical Therapy

## 2019-05-16 ENCOUNTER — Other Ambulatory Visit: Payer: Self-pay

## 2019-05-16 DIAGNOSIS — R2689 Other abnormalities of gait and mobility: Secondary | ICD-10-CM | POA: Insufficient documentation

## 2019-05-16 DIAGNOSIS — R293 Abnormal posture: Secondary | ICD-10-CM | POA: Diagnosis present

## 2019-05-16 NOTE — Therapy (Signed)
[facility] [address] [place], [place], [address] Phone: [phone]   Fax:  [phone]  Physical Therapy Treatment  Patient Details  Name: Joshua [name] MRN: [id number] Date of Birth: [birth year] Referring Provider (PT): Alonza Bogus, DO   Encounter Date: 05/16/2019  PT End of Session - 05/16/19 0804    Visit Number  1    Number of Visits  1    Authorization Type  [employer]    PT Start Time  0802    PT Stop Time  0838    PT Time Calculation (min)  36 min    Activity Tolerance  Patient tolerated treatment well    Behavior During Therapy  [facility] for tasks assessed/performed       Past Medical History:  Diagnosis Date  . Allergic rhinitis   . Allergy   . Anal fissure   . Anxiety    from chronic pain from surgery- on Cymbalta  . Arthritis   . Asthma   . Barrett's esophagus 03/29/2014  . Carotid artery disease ([place])    Carotid Doppler normal August, 2007  . Cataract   . Chest pain    Coronaries normal 1996 /  nuclear..06/2002..normal...EF  56% /  stress echo.. May, 2011.... no  scar or ischemia... rate related RBBB  . Diverticulosis   . Dyslipidemia   . Ejection fraction    EF 60%, stress echo, May, 2011  . GERD (gastroesophageal reflux disease)   . Headache   . History of loop recorder    has since 04/04/15  . HTN (hypertension)    takes Metoprolol for PVC control  . Hx of colonic polyps    adenomatous  . Hx of colonoscopy   . Hyperlipidemia   . IFG (impaired fasting glucose)   . Palpitations    Benign PVCs  . Parkinson disease ([place])   . Prostate cancer ([place])   . RBBB (right bundle branch block)    rate related  . Shingles   . Sleep apnea    pt denies  . Stroke [facility])    TIA  . TIA (transient ischemic attack) 02/2015   Per pt, had 2 strokes  . Tremor    Hand tremor  . Vertigo     Past Surgical History:  Procedure Laterality Date  . BACK SURGERY  2002,2009   x 6  .  CHOLECYSTECTOMY  11/30/2012   with IOC  . COLONOSCOPY    . ELECTROPHYSIOLOGIC STUDY N/A 09/26/2015   Procedure: V Tach Ablation (PVC);  Surgeon: Will Meredith Leeds, MD;  Location: [facility] CV LAB;  Service: Cardiovascular;  Laterality: N/A;  . EP IMPLANTABLE DEVICE N/A 04/04/2015   Procedure: Loop Recorder Insertion;  Surgeon: Will Meredith Leeds, MD;  Location: [facility] CV [facility];  Service: Cardiovascular;  Laterality: N/A;  . HERNIA REPAIR     laprascopic  . KNEE SURGERY     DECEMBER 2017,LEFT KNEE SCOPED  . NECK SURGERY  2002  . POLYPECTOMY    . ROTATOR CUFF REPAIR Left   . TEE WITHOUT CARDIOVERSION N/A 04/04/2015   Procedure: TRANSESOPHAGEAL ECHOCARDIOGRAM (TEE);  Surgeon: Lelon Perla, MD;  [facility]COPY;  Service: Cardiovascular;  Laterality: N/A;  . UPPER GASTROINTESTINAL ENDOSCOPY    . V Tach ablation  09/26/2015    There were no vitals filed for this visit.  Subjective Assessment - 05/16/19 0804    Subjective  Tremors are worse, really both hands and in my whole  body.  To see Dr. Carles Collet later this month and will tell her about that.  Having a hard time standing up straight through the day.  Had two falls, but not sure how long ago.  Just trying to be more careful.    Pertinent History  PMH includes multiple back surgeries/fusion neck and lumbar spine (LLE weaker), ?syncopal episodes, arthritis    Patient Stated Goals  Pt's goal is to try to do everything he can to keep strength in shoulders and arms.    Currently in Pain?  Yes    Pain Score  7     Pain Location  Back    Pain Descriptors / Indicators  Aching    Pain Type  Chronic pain    Pain Onset  More than a month ago    Pain Frequency  Constant    Aggravating Factors   weather    Pain Relieving Factors  sitting, exercises do not bother         [facility] PT Assessment - 05/16/19 0809      Assessment   Medical Diagnosis  Parkinson's disease    Referring Provider (PT)  Alonza Bogus, DO    Onset Date/Surgical  Date  --   October 2020-d/ c PT     Precautions   Precautions  Fall      Balance Screen   Has the patient fallen in the past 6 months  Yes    How many times?  2    Has the patient had a decrease in activity level because of a fear of falling?   No    Is the patient reluctant to leave their home because of a fear of falling?   No      Home Environment   Living Environment  Private residence    Living Arrangements  Spouse/significant other    Available Help at Discharge  Family    Type of [place] Access  Level entry    [place] - single point;Grab bars - toilet;Grab bars - tub/shower;Shower seat    Additional Comments  uses cane or walking stick outdoors      Prior Function   Level of Independence  Independent   difficulty with buttons, handwriting   Leisure  Did exercise at [facility], but stopped due to shoulder pain; does PT exercises (HEP) weekly      Posture/Postural Control   Posture/Postural Control  Postural limitations    Postural Limitations  Rounded Shoulders;Forward head;Flexed trunk      ROM / Strength   AROM / PROM / Strength  Strength      Strength   Overall Strength  Deficits    Overall Strength Comments  Grossly tested 4+/5 RLE, 4/5 LLE (weakness LLE due to back pain per pt report); L ankle dorsiflexion 3+/5      Transfers   Transfers  Sit to Stand;Stand to Sit    Sit to Stand  6: Modified independent (Device/Increase time);Without upper extremity assist;From chair/3-in-1    Five time sit to stand comments   12.85    Stand to Sit  6: Modified independent (Device/Increase time);Without upper extremity assist;To chair/3-in-1      Ambulation/Gait   Ambulation/Gait  Yes    Ambulation/Gait Assistance  6: Modified independent (Device/Increase time)    Ambulation Distance (Feet)  300 Feet    Assistive device  None    Gait Pattern  Step-through pattern;Decreased arm swing - right;Decreased step length - right;Decreased  step length - left;Decreased trunk rotation;Trunk flexed    Ambulation Surface  Level;Indoor    Gait velocity  12 sec = 2.73 ft/sec      Standardized Balance Assessment   Standardized Balance Assessment  Timed Up and Go Test;Dynamic Gait Index      Dynamic Gait Index   Level Surface  Mild Impairment    Change in Gait Speed  Mild Impairment    Gait with Horizontal Head Turns  Mild Impairment    Gait with Vertical Head Turns  Normal    Gait and Pivot Turn  Normal    Step Over Obstacle  Mild Impairment    Step Around Obstacles  Normal    Steps  Mild Impairment    Total Score  19    DGI comment:  Scores <19/24 indicate increased fall risk (at discharge 10/2018, DGI was 18/24)      Timed Up and Go Test   TUG  Normal TUG;Cognitive TUG    Normal TUG (seconds)  13.31    Cognitive TUG (seconds)  13.56    TUG Comments  Scores > 13.5-15 sec indicate increased fall risk.                           PT Education - 05/16/19 0934    Education Details  Eval measures, comparisons to d/c October 2020 and plans for return screens (PT, OT, speech) in 6-9 months)    Person(s) Educated  Patient    Methods  Explanation    Comprehension  Verbalized understanding                 Plan - 05/16/19 0935    Clinical Impression Statement  Pt is a 74 year old male who presents to OPPT with history of Parkinson's disease and back pain/back and neck surgeries.  He presents for return PT eval from d/c Oct. 2020.  He reports no significant changes in balance, but does overall feel more tremors (hands and whole body) and more postural changes.  He reports continueing to do his PWR! Moves exercises from HEP.  His objective measures are similar to those at discharge:  5x sit<>stand improved 12.85 sec today compared to 15.3 sec; DGI improved ay 19/24, from 18/24 at d/c.  Gait velocity has slowed slightly to 2.73 ft/sec (from 3.14 ft/sec); however gait velocity still that of a community  abmulator.  TUG score 13.31 slowed slightly from 11.63 sec, but [facility] for balance scores.  Overall, pt feels he is doing well and no significant changes since eval; PT agrees no skilled PT needs at this time.  Discussed importance of continuing HEP from previous bout of therapy and follow up with return screens (PT, OT, speech) in 6-9 months.    Personal Factors and Comorbidities  Comorbidity 3+    Comorbidities  PMH includes chronic pain, asthma, OA, CAD, GERD, HTN, back surgeries, neck surgery, L RTC repair ; hx of hand pain R, trigger fingers L    Examination-Activity Limitations  Locomotion Level;Transfers    Examination-Participation Restrictions  Community Activity   community fitness   Stability/Clinical Decision Making  Stable/Uncomplicated    Clinical Decision Making  Low    Rehab Potential  Good    PT Frequency  One time visit   eval only   PT Next Visit Plan  No further skilled PT needs at this time; recommend follow  up PT, OT, speech screens in 6-9 months.    Consulted and Agree with Plan of Care  Patient       Patient will benefit from skilled therapeutic intervention in order to improve the following deficits and impairments:     Visit Diagnosis: Other abnormalities of gait and mobility  Abnormal posture     Problem List Patient Active Problem List   Diagnosis Date Noted  . Malignant neoplasm of prostate ([place]) 11/01/2017  . Essential tremor 12/06/2015  . PVC (premature ventricular contraction) 09/26/2015  . Embolic stroke involving left middle cerebral artery ([place]) 04/07/2015  . Acute CVA (cerebrovascular accident) ([place]) 03/06/2015  . Stroke (cerebrum) ([place]) 03/05/2015  . CVA (cerebral infarction) 03/05/2015  . Weakness of right upper extremity   . Tremor   . Ejection fraction   . Vertigo   . Sleep apnea   . Palpitations   . Dyslipidemia   . GERD (gastroesophageal reflux disease)   . HTN (hypertension)   . Chest pain   . RBBB (right bundle branch block)   .  Ejection fraction   . Carotid artery disease ([place])     Joshua [name] W. 05/16/2019, 9:41 AM Frazier Butt., PT  [facility] [address] [place], [place], [address] Phone: [phone]   Fax:  [phone]  Name: Joshua [name] MRN: [id number] Date of Birth: September 14, 1945

## 2019-07-12 NOTE — Progress Notes (Signed)
Assessment/Plan:   1.  Parkinsons Disease, with levodopa resistant tremor (possible ET/PD)  -Levodopa challenge test in the past it did not show significant benefit to levodopa, but primary issue at that time was tremor and patient does have levodopa resistant tremor.  Has since developed slowness.  Would redo this test if we ever consider surgical interventions.  He and I discussed this today.  -Increase carbidopa/levodopa 25/100, 2 tablets at 7 AM/2 tablets at 11 AM/continue 1 tablet at3 PM/1 tablet 7 PM  -Continue carbidopa/levodopa 50/200 at bedtime   -Discussed that we may retry pramipexole.  He tried that in the past, but again that was when he only had tremor and we stopped it because it did not help tremor.  2.  History of watershed infarct  -Now with loop recorder.  On Plavix. 3.  History of multiple syncopal episodes.  -I worry related to orthostatic, and wonder if propranolol contributes.  cardiology does not think related to propranolol but he is off of it now and has done much better. 4.  LBP  -sees Dr. Brien Few but has not been there in a year and encouraged him to get back there to make sure that there is not a primary back issue going on again.  He has seen Dr. Izell Wahak Hotrontk in the past.  He has had neck and back surgery previously. Subjective:   Joshua [name] was seen today in follow up for Parkinsons disease.  My previous records were reviewed prior to todays visit as well as outside records available to me. This patient is accompanied in the office by his spouse who supplements the history.Pt denies falls.  Pt denies lightheadedness, near syncope.  No hallucinations.  Mood has been good.  No further syncopal episodes.  Has not followed with cardiology since our last visit.  Has seen ENT on June 8 regarding hearing loss.  This was just related to cerumen impaction.  More back pain/neck pain but he describes that as "fatigue/weakness" from leaning forward.    Current prescribed  movement disorder medications: Carbidopa/levodopa 25/100, 1 tablet at 7 AM/11 AM/3 PM/7 PM Carbidopa/levodopa 50/200 at bedtime (added last visit)   PREVIOUS MEDICATIONS: Mirapex and primidone; propranolol; metoprolol  ALLERGIES:   Allergies  Allergen Reactions  . Floxin I.V. In Dextrose 5% [Ofloxacin] Other (See Comments)    Lowers BP Lowers BP  . Terazosin Other (See Comments)    CURRENT MEDICATIONS:  Outpatient Encounter Medications as of 07/14/2019  Medication Sig  . carbidopa-levodopa (SINEMET CR) 50-200 MG tablet Take 1 tablet by mouth at bedtime.  . carbidopa-levodopa (SINEMET IR) 25-100 MG tablet 2 tablets at 7am/2 at 11am, 1 at 3pm and 1 at 7pm  . clopidogrel (PLAVIX) 75 MG tablet Take 1 tablet (75 mg total) by mouth daily.  [name] DYMISTA 137-50 MCG/ACT SUSP Place 1 puff into both nostrils at bedtime.   [name] escitalopram (LEXAPRO) 10 MG tablet TAKE 1 TABLET BY MOUTH EVERY DAY (TO REPLACE DULOXETINE)  . finasteride (PROSCAR) 5 MG tablet Take 5 mg by mouth Daily.  . flecainide (TAMBOCOR) 150 MG tablet TAKE 1/2 TABLET BY MOUTH EVERY 12 HOURS  . levocetirizine (XYZAL) 5 MG tablet Take 5 mg by mouth every evening.    . methocarbamol (ROBAXIN) 500 MG tablet TAKE 1 TABLET (500 MG TOTAL) BY MOUTH FOUR (4) TIMES A DAY.  . montelukast (SINGULAIR) 10 MG tablet Take 10 mg by mouth at bedtime.    [name] PROCTOSOL HC 2.5 % rectal cream APPLY  INTO AND AROUND RECTUM 2 TIMES A DAY  . rosuvastatin (CRESTOR) 10 MG tablet Take 5 mg by mouth daily.   . tamsulosin (FLOMAX) 0.4 MG CAPS capsule Take 0.4 mg by mouth daily after supper.   . traMADol (ULTRAM) 50 MG tablet Take 50 mg by mouth every 6 (six) hours as needed. for pain  . zolpidem (AMBIEN) 10 MG tablet Take 10 mg by mouth at bedtime.  . [DISCONTINUED] carbidopa-levodopa (SINEMET CR) 50-200 MG tablet Take 1 tablet by mouth at bedtime.  . [DISCONTINUED] carbidopa-levodopa (SINEMET IR) 25-100 MG tablet Take 1 tablet by mouth 4 (four) times daily.  .  [DISCONTINUED] albuterol (PROVENTIL HFA;VENTOLIN HFA) 108 (90 Base) MCG/ACT inhaler Inhale 2 puffs into the lungs every 4 (four) hours as needed for wheezing. (Patient not taking: Reported on 07/14/2019)  . [DISCONTINUED] gabapentin (NEURONTIN) 100 MG capsule TAKE 1 CAPSULE BY MOUTH EVERY 8 HOURS AS NEEDED FOR PAIN (Patient not taking: Reported on 07/14/2019)  . [DISCONTINUED] propranolol (INDERAL) 20 MG tablet Take 20 mg by mouth 2 (two) times daily. (Patient not taking: Reported on 07/14/2019)  . [DISCONTINUED] trihexyphenidyl (ARTANE) 2 MG tablet TAKE 1 TABLET BY MOUTH EVERY MORNING (Patient not taking: Reported on 05/16/2019)   No facility-administered encounter medications on file as of 07/14/2019.    Objective:   PHYSICAL EXAMINATION:    VITALS:   Vitals:   07/14/19 1445  BP: (!) 145/88  Pulse: 75  SpO2: 95%  Weight: 231 lb (104.8 kg)  Height: 6\' 1"  (1.854 m)    GEN:  The patient appears stated age and is in NAD. HEENT:  Normocephalic, atraumatic.  The mucous membranes are moist. The superficial temporal arteries are without ropiness or tenderness. CV:  RRR Lungs:  CTAB Neck/HEME:  There are no carotid bruits bilaterally.  Neurological examination:  Orientation: The patient is alert and oriented x3. Cranial nerves: There is good facial symmetry with facial hypomimia with lips occasionally parted. The speech is fluent and clear and voice is strong.. Soft palate rises symmetrically and there is no tongue deviation. Hearing is intact to conversational tone. Sensation: Sensation is intact to light touch throughout Motor: Strength is at least antigravity x4.  Movement examination: Tone: There is mild to moderate increased tone in the right upper extremity and mild increased in the right lower extremity Abnormal movements: There is right upper extremity rest tremor Coordination:  There is  decremation with RAM's, on the right Gait and Station: The patient has no difficulty arising out of  a deep-seated chair without the use of the hands.  Patient is flexed at the waist.  He has decreased arm swing, but ambulates fairly well in the hall.  I have reviewed and interpreted the following labs independently    Chemistry      Component Value Date/Time   NA 138 09/18/2015 1135   NA 143 04/04/2015 1501   K 4.4 09/18/2015 1135   CL 105 09/18/2015 1135   CO2 22 09/18/2015 1135   BUN 19 09/18/2015 1135   BUN 19 04/04/2015 1501   CREATININE 0.88 09/18/2015 1135      Component Value Date/Time   CALCIUM 9.4 09/18/2015 1135   ALKPHOS 52 08/25/2015 1140   AST 23 08/25/2015 1140   ALT 22 08/25/2015 1140   BILITOT 1.1 08/25/2015 1140   BILITOT 0.7 04/04/2015 1501       Lab Results  Component Value Date   WBC 8.4 09/18/2015   HGB 14.8 09/18/2015   HCT  42.1 09/18/2015   MCV 89.4 09/18/2015   PLT 200 09/18/2015    Lab Results  Component Value Date   TSH 1.240 04/04/2015     Total time spent on today's visit was 45 minutes, including both face-to-face time and nonface-to-face time.  Time included that spent on review of records (prior notes available to me/labs/imaging if pertinent), discussing treatment and goals, answering patient's questions and coordinating care.  Cc:  Marton Redwood, MD

## 2019-07-14 ENCOUNTER — Ambulatory Visit (INDEPENDENT_AMBULATORY_CARE_PROVIDER_SITE_OTHER): Payer: Medicare HMO | Admitting: Neurology

## 2019-07-14 ENCOUNTER — Other Ambulatory Visit: Payer: Self-pay

## 2019-07-14 ENCOUNTER — Encounter: Payer: Self-pay | Admitting: Neurology

## 2019-07-14 VITALS — BP 145/88 | HR 75 | Ht 73.0 in | Wt 231.0 lb

## 2019-07-14 DIAGNOSIS — G903 Multi-system degeneration of the autonomic nervous system: Secondary | ICD-10-CM

## 2019-07-14 DIAGNOSIS — G20 Parkinson's disease: Secondary | ICD-10-CM

## 2019-07-14 MED ORDER — CARBIDOPA-LEVODOPA ER 50-200 MG PO TBCR: 1.0000 | EXTENDED_RELEASE_TABLET | Freq: Every day | ORAL | 1 refills | Status: DC

## 2019-07-14 MED ORDER — CARBIDOPA-LEVODOPA 25-100 MG PO TABS: ORAL_TABLET | ORAL | 1 refills | Status: DC

## 2019-07-14 NOTE — Patient Instructions (Addendum)
1.  Increase carbidopa/levodopa 25/100, 2 tablets at 7am/2 at 11am, 1 at 3pm and 1 at 7pm 2.  Continue carbidopa/levodopa 50/200 at bedtime  The physicians and staff at [facility] are committed to providing excellent care. You may receive a survey requesting feedback about your experience at our office. We strive to receive "very good" responses to the survey questions. If you feel that your experience would prevent you from giving the office a "very good " response, please contact our office to try to remedy the situation. We may be reached at [phone]. Thank you for taking the time out of your busy day to complete the survey.

## 2019-10-02 ENCOUNTER — Telehealth: Payer: Self-pay | Admitting: Cardiology

## 2019-10-02 NOTE — Telephone Encounter (Signed)
Ok to schedule pt w/ [facility].  Please send me a message when scheduled so that I may address the ILR implant needs. Thanks Caryl Pina

## 2019-10-02 NOTE — Telephone Encounter (Signed)
Joshua [name] requested an appointment per patient messages stating in the comments "Loop recorder removal and yearly check up" I have advised him of the available dates for Dr. Curt Bears for his one year f/u, but have not yet scheduled it due to waiting on the pt's response. Please advise in regards to the loop recorder removal requested.

## 2019-10-03 NOTE — Telephone Encounter (Signed)
Joshua [name] has now been scheduled to see Dr. Curt Bears on 01/22/20 and added to the wait list incase of a sooner appointment becoming available. I also advised him you will contacting him to discuss the removal.

## 2019-10-04 NOTE — Telephone Encounter (Signed)
Joshua [name],  Did pt want to wait till January?  There were plenty of openings in Nov/Dec...Marland KitchenMarland Kitchen

## 2019-10-04 NOTE — Telephone Encounter (Signed)
The recall in the system was listed as a regular office visit. I don't see any available appointment slots for an OV, just [employer] and [employer]. Is it okay to schedule him in one of those slots?

## 2019-11-01 NOTE — Telephone Encounter (Signed)
[place], pt scheduled for 01/22/20 OV for f/u & ILR EXplant -- please send precert message, etc. (cc me please) thx

## 2019-11-02 NOTE — Telephone Encounter (Signed)
Spoke to pt, made aware that if he preferred we could move appt up, pt comfortable with keeping appt in January. Discussed ILR explant.

## 2019-11-23 ENCOUNTER — Ambulatory Visit: Payer: Medicare HMO | Attending: Neurology

## 2019-11-23 ENCOUNTER — Ambulatory Visit: Payer: Medicare HMO | Admitting: Occupational Therapy

## 2019-11-23 ENCOUNTER — Other Ambulatory Visit: Payer: Self-pay

## 2019-11-23 ENCOUNTER — Telehealth: Payer: Self-pay

## 2019-11-23 ENCOUNTER — Ambulatory Visit: Payer: Medicare HMO | Admitting: Physical Therapy

## 2019-11-23 DIAGNOSIS — R278 Other lack of coordination: Secondary | ICD-10-CM

## 2019-11-23 DIAGNOSIS — R1310 Dysphagia, unspecified: Secondary | ICD-10-CM

## 2019-11-23 DIAGNOSIS — R471 Dysarthria and anarthria: Secondary | ICD-10-CM | POA: Insufficient documentation

## 2019-11-23 NOTE — Therapy (Signed)
[facility] [address] [place], [place], [address] Phone: [phone]   Fax:  [phone]  Patient Details  Name: Quitman Norberto MRN: [id number] Date of Birth: 1945/05/17 Referring Provider:  Marton Redwood, MD  Encounter Date: 11/23/2019 Occupational Therapy Parkinson's Disease Screen  Hand dominance:  Right   Physical Performance Test item #2 (simulated eating):  19.82 sec  Fastening/unfastening 3 buttons in:  36.72 sec  9-hole peg test:    RUE  42.66 sec        LUE  45.37 sec  Change in ability to perform ADLs/IADLs: yes increased time   Other Comments:  Changes in legibility in handwriting due to tremor.  Pt would benefit from occupational therapy evaluation due to  Change in ADL performance, decreased coordination.  Shirleen Mcfaul 11/23/2019, 8:07 AM Theone Murdoch, OTR/L [phone] Phone: [phone] 8:07 AM 11/24/19 [facility] [address] [place], [place], [address] Phone: [phone]   Fax:  [phone]

## 2019-11-23 NOTE — Telephone Encounter (Signed)
Pt participated in our multi-d Parkinson's disease clinic today and OT and ST evaluations were recommended.  In OT screen pt demonstrated decreased fine motor control in ADLs. For ST pt demonstrated decr'd conversational speech volume and decr'd voice quality. Additionally, pt reported some dysphagia with meals. Pt will potentially have hand surgery in the near future, and will schedule these therapy evaluations following this surgery.

## 2019-11-23 NOTE — Telephone Encounter (Signed)
Let us know when you need orders entered

## 2019-11-23 NOTE — Therapy (Signed)
[facility] [address] [place], [place], [address] Phone: [phone]   Fax:  [phone]  Patient Details  Name: Joshua [name] MRN: [id number] Date of Birth: 1945/07/16 Referring Provider:  Ludwig Clarks, DO  Encounter Date: 11/23/2019  Speech Therapy Parkinson's Disease Screen   Decibel Level today: upper 60sdB  (WNL=70-72 dB) with sound level meter 30cm away from pt's mouth. This is lower than normal conversational loudness.  Pt appears like he was experienced difficulty in swallowing warranting objective evaluation, however SLP will assess more fully at evaluation, if ordered.  Pt would benefit from speech-language eval for dysarthria and a bedside swallow eval - please order via [facility] ,[place], [place]  11/23/2019, 8:51 AM  [facility] [address] [place], [place], [address] Phone: [phone]   Fax:  [phone]

## 2019-12-11 ENCOUNTER — Other Ambulatory Visit: Payer: Self-pay | Admitting: Orthopedic Surgery

## 2019-12-22 ENCOUNTER — Encounter (HOSPITAL_BASED_OUTPATIENT_CLINIC_OR_DEPARTMENT_OTHER): Payer: Self-pay | Admitting: Orthopedic Surgery

## 2019-12-22 ENCOUNTER — Other Ambulatory Visit: Payer: Self-pay

## 2019-12-25 ENCOUNTER — Telehealth: Payer: Self-pay

## 2019-12-25 ENCOUNTER — Encounter (HOSPITAL_BASED_OUTPATIENT_CLINIC_OR_DEPARTMENT_OTHER)
Admission: RE | Admit: 2019-12-25 | Discharge: 2019-12-25 | Disposition: A | Discharge status: Home / Self Care | Payer: Medicare HMO | Source: Ambulatory Visit | Attending: Orthopedic Surgery | Admitting: Orthopedic Surgery

## 2019-12-25 ENCOUNTER — Other Ambulatory Visit (HOSPITAL_COMMUNITY)
Admission: RE | Admit: 2019-12-25 | Discharge: 2019-12-25 | Disposition: A | Discharge status: Home / Self Care | Payer: Medicare HMO | Source: Ambulatory Visit | Attending: Orthopedic Surgery | Admitting: Orthopedic Surgery

## 2019-12-25 DIAGNOSIS — Z01810 Encounter for preprocedural cardiovascular examination: Secondary | ICD-10-CM | POA: Insufficient documentation

## 2019-12-25 DIAGNOSIS — I10 Essential (primary) hypertension: Secondary | ICD-10-CM | POA: Insufficient documentation

## 2019-12-25 DIAGNOSIS — Z01812 Encounter for preprocedural laboratory examination: Secondary | ICD-10-CM | POA: Diagnosis present

## 2019-12-25 DIAGNOSIS — Z20822 Contact with and (suspected) exposure to covid-19: Secondary | ICD-10-CM | POA: Insufficient documentation

## 2019-12-25 LAB — SARS CORONAVIRUS 2 (TAT 6-24 HRS): SARS Coronavirus 2: NEGATIVE

## 2019-12-25 NOTE — Progress Notes (Signed)

## 2019-12-25 NOTE — Telephone Encounter (Signed)
   [facility] Pre-operative Risk Assessment    HEARTCARE STAFF: - Please ensure there is not already an duplicate clearance open for this procedure. - Under Visit Info/Reason for Call, type in Other and utilize the format Clearance MM/DD/YY or Clearance TBD. Do not use dashes or single digits. - If request is for dental extraction, please clarify the # of teeth to be extracted.  Request for surgical clearance:  1. What type of surgery is being performed? A1 pulley release on left middle and ring finger  2. When is this surgery scheduled? 12/28/19  3. What type of clearance is required (medical clearance vs. Pharmacy clearance to hold med vs. Both)? medical  4. Are there any medications that need to be held prior to surgery and how long? n/a  5. Practice name and name of physician performing surgery? [facility] - Dr. Gary Kuzma  6. What is the office phone number? [phone]   7.   What is the office fax number? [phone]  8.   Anesthesia type (None, local, MAC, general) ? IV Regional   Joshua [name] 12/22/2019, 3:16 PM  _________________________________________________________________   (provider comments below)   

## 2019-12-26 NOTE — Telephone Encounter (Signed)
   Primary Cardiologist: Will Meredith Leeds, MD  Chart reviewed as part of pre-operative protocol coverage. Patient was contacted 12/26/2019 in reference to pre-operative risk assessment for pending surgery as outlined below.  Joshua [name] was last seen on 11/24/18 by Dr. Curt Bears.  Since that day, Joshua [name] has done well. He does not have a history of coronary artery disease or PCI. He takes plavix for stroke in 2017. He is able to complete more than 4.0 METS without angina. His surgery is planned for 12/28/19. EKG yesterday is stable from prior, including known RBBB and PVCs.  Therefore, based on ACC/AHA guidelines, the patient would be at acceptable risk for the planned procedure without further cardiovascular testing.   The patient was advised that if he develops new symptoms prior to surgery to contact our office to arrange for a follow-up visit, and he verbalized understanding.  I will route this recommendation to the requesting party via Epic fax function and remove from pre-op pool. Please call with questions.  Tami Lin Keelan Pomerleau, PA 12/26/2019, 11:47 AM

## 2019-12-28 ENCOUNTER — Encounter (HOSPITAL_BASED_OUTPATIENT_CLINIC_OR_DEPARTMENT_OTHER)
Admission: RE | Disposition: A | Discharge status: Home / Self Care | Payer: Self-pay | Source: Home / Self Care | Attending: Orthopedic Surgery

## 2019-12-28 ENCOUNTER — Other Ambulatory Visit: Payer: Self-pay

## 2019-12-28 ENCOUNTER — Encounter (HOSPITAL_BASED_OUTPATIENT_CLINIC_OR_DEPARTMENT_OTHER): Payer: Self-pay | Admitting: Orthopedic Surgery

## 2019-12-28 ENCOUNTER — Ambulatory Visit (HOSPITAL_BASED_OUTPATIENT_CLINIC_OR_DEPARTMENT_OTHER): Payer: Medicare HMO | Admitting: Anesthesiology

## 2019-12-28 ENCOUNTER — Ambulatory Visit (HOSPITAL_BASED_OUTPATIENT_CLINIC_OR_DEPARTMENT_OTHER)
Admission: RE | Admit: 2019-12-28 | Discharge: 2019-12-28 | Disposition: A | Discharge status: Home / Self Care | Payer: Medicare HMO | Attending: Orthopedic Surgery | Admitting: Orthopedic Surgery

## 2019-12-28 DIAGNOSIS — I4510 Unspecified right bundle-branch block: Secondary | ICD-10-CM | POA: Diagnosis not present

## 2019-12-28 DIAGNOSIS — Z8673 Personal history of transient ischemic attack (TIA), and cerebral infarction without residual deficits: Secondary | ICD-10-CM | POA: Insufficient documentation

## 2019-12-28 DIAGNOSIS — M65312 Trigger thumb, left thumb: Secondary | ICD-10-CM | POA: Insufficient documentation

## 2019-12-28 DIAGNOSIS — Z888 Allergy status to other drugs, medicaments and biological substances status: Secondary | ICD-10-CM | POA: Diagnosis not present

## 2019-12-28 DIAGNOSIS — M65342 Trigger finger, left ring finger: Secondary | ICD-10-CM | POA: Diagnosis not present

## 2019-12-28 DIAGNOSIS — M65332 Trigger finger, left middle finger: Secondary | ICD-10-CM | POA: Diagnosis not present

## 2019-12-28 DIAGNOSIS — Z803 Family history of malignant neoplasm of breast: Secondary | ICD-10-CM | POA: Insufficient documentation

## 2019-12-28 DIAGNOSIS — G20 Parkinson's disease: Secondary | ICD-10-CM | POA: Insufficient documentation

## 2019-12-28 DIAGNOSIS — Z8249 Family history of ischemic heart disease and other diseases of the circulatory system: Secondary | ICD-10-CM | POA: Insufficient documentation

## 2019-12-28 DIAGNOSIS — I2510 Atherosclerotic heart disease of native coronary artery without angina pectoris: Secondary | ICD-10-CM | POA: Diagnosis not present

## 2019-12-28 DIAGNOSIS — Z8546 Personal history of malignant neoplasm of prostate: Secondary | ICD-10-CM | POA: Diagnosis not present

## 2019-12-28 DIAGNOSIS — Z881 Allergy status to other antibiotic agents status: Secondary | ICD-10-CM | POA: Insufficient documentation

## 2019-12-28 DIAGNOSIS — M1990 Unspecified osteoarthritis, unspecified site: Secondary | ICD-10-CM | POA: Insufficient documentation

## 2019-12-28 HISTORY — PX: TRIGGER FINGER RELEASE: SHX641

## 2019-12-28 SURGERY — RELEASE, A1 PULLEY, FOR TRIGGER FINGER
Anesthesia: Monitor Anesthesia Care | Site: Hand | Laterality: Left

## 2019-12-28 MED ORDER — LIDOCAINE HCL (PF) 0.5 % IJ SOLN
INTRAMUSCULAR | Status: DC | PRN
  Administered 2019-12-28: 30 mL via INTRAVENOUS

## 2019-12-28 MED ORDER — LACTATED RINGERS IV SOLN: INTRAVENOUS | Status: DC

## 2019-12-28 MED ORDER — FENTANYL CITRATE (PF) 100 MCG/2ML IJ SOLN
INTRAMUSCULAR | Status: DC | PRN
  Administered 2019-12-28 (×2): 50 ug via INTRAVENOUS

## 2019-12-28 MED ORDER — PROPOFOL 500 MG/50ML IV EMUL
INTRAVENOUS | Status: DC | PRN
  Administered 2019-12-28: 50 ug/kg/min via INTRAVENOUS

## 2019-12-28 MED ORDER — PROPOFOL 10 MG/ML IV BOLUS
INTRAVENOUS | Status: AC
  Filled 2019-12-28: qty 20

## 2019-12-28 MED ORDER — ACETAMINOPHEN 500 MG PO TABS
ORAL_TABLET | ORAL | Status: AC
  Filled 2019-12-28: qty 2

## 2019-12-28 MED ORDER — FENTANYL CITRATE (PF) 100 MCG/2ML IJ SOLN: 25.0000 ug | INTRAMUSCULAR | Status: DC | PRN

## 2019-12-28 MED ORDER — ONDANSETRON HCL 4 MG/2ML IJ SOLN
INTRAMUSCULAR | Status: AC
  Filled 2019-12-28: qty 2

## 2019-12-28 MED ORDER — CEFAZOLIN SODIUM-DEXTROSE 2-4 GM/100ML-% IV SOLN
INTRAVENOUS | Status: AC
  Filled 2019-12-28: qty 100

## 2019-12-28 MED ORDER — TRAMADOL HCL 50 MG PO TABS: 50.0000 mg | ORAL_TABLET | Freq: Four times a day (QID) | ORAL | 0 refills | Status: DC | PRN

## 2019-12-28 MED ORDER — BUPIVACAINE HCL (PF) 0.25 % IJ SOLN
INTRAMUSCULAR | Status: DC | PRN
  Administered 2019-12-28: 10 mL

## 2019-12-28 MED ORDER — PROPOFOL 10 MG/ML IV BOLUS
INTRAVENOUS | Status: DC | PRN
  Administered 2019-12-28: 20 mg via INTRAVENOUS

## 2019-12-28 MED ORDER — CEFAZOLIN SODIUM-DEXTROSE 2-4 GM/100ML-% IV SOLN
2.0000 g | INTRAVENOUS | Status: AC
  Administered 2019-12-28: 2 g via INTRAVENOUS

## 2019-12-28 MED ORDER — FENTANYL CITRATE (PF) 100 MCG/2ML IJ SOLN
INTRAMUSCULAR | Status: AC
  Filled 2019-12-28: qty 2

## 2019-12-28 MED ORDER — ONDANSETRON HCL 4 MG/2ML IJ SOLN
INTRAMUSCULAR | Status: DC | PRN
  Administered 2019-12-28: 4 mg via INTRAVENOUS

## 2019-12-28 MED ORDER — ACETAMINOPHEN 500 MG PO TABS
1000.0000 mg | ORAL_TABLET | Freq: Once | ORAL | Status: AC
  Administered 2019-12-28: 1000 mg via ORAL

## 2019-12-28 SURGICAL SUPPLY — 32 items
APL PRP STRL LF DISP 70% ISPRP (MISCELLANEOUS) ×1
BLADE SURG 15 STRL LF DISP TIS (BLADE) ×1 IMPLANT
BLADE SURG 15 STRL SS (BLADE) ×2
BNDG CMPR 9X4 STRL LF SNTH (GAUZE/BANDAGES/DRESSINGS)
BNDG COHESIVE 2X5 TAN STRL LF (GAUZE/BANDAGES/DRESSINGS) ×2 IMPLANT
BNDG ESMARK 4X9 LF (GAUZE/BANDAGES/DRESSINGS) IMPLANT
CHLORAPREP W/TINT 26 (MISCELLANEOUS) ×2 IMPLANT
CORD BIPOLAR FORCEPS 12FT (ELECTRODE) IMPLANT
COVER BACK TABLE 60X90IN (DRAPES) ×2 IMPLANT
COVER MAYO STAND STRL (DRAPES) ×2 IMPLANT
COVER WAND RF STERILE (DRAPES) IMPLANT
CUFF TOURN SGL QUICK 18X4 (TOURNIQUET CUFF) IMPLANT
DECANTER SPIKE VIAL GLASS SM (MISCELLANEOUS) IMPLANT
DRAPE EXTREMITY T 121X128X90 (DISPOSABLE) ×2 IMPLANT
DRAPE SURG 17X23 STRL (DRAPES) ×2 IMPLANT
GAUZE SPONGE 4X4 12PLY STRL (GAUZE/BANDAGES/DRESSINGS) ×2 IMPLANT
GAUZE XEROFORM 1X8 LF (GAUZE/BANDAGES/DRESSINGS) ×2 IMPLANT
GLOVE BIOGEL PI IND STRL 8.5 (GLOVE) ×1 IMPLANT
GLOVE BIOGEL PI INDICATOR 8.5 (GLOVE) ×1
GLOVE SURG ORTHO 8.0 STRL STRW (GLOVE) ×2 IMPLANT
GOWN STRL REUS W/ TWL LRG LVL3 (GOWN DISPOSABLE) ×1 IMPLANT
GOWN STRL REUS W/TWL LRG LVL3 (GOWN DISPOSABLE) ×2
GOWN STRL REUS W/TWL XL LVL3 (GOWN DISPOSABLE) ×2 IMPLANT
NEEDLE PRECISIONGLIDE 27X1.5 (NEEDLE) ×2 IMPLANT
NS IRRIG 1000ML POUR BTL (IV SOLUTION) ×2 IMPLANT
PACK BASIN DAY SURGERY FS (CUSTOM PROCEDURE TRAY) ×2 IMPLANT
STOCKINETTE 4X48 STRL (DRAPES) ×2 IMPLANT
SUT ETHILON 4 0 PS 2 18 (SUTURE) ×2 IMPLANT
SYR BULB EAR ULCER 3OZ GRN STR (SYRINGE) ×2 IMPLANT
SYR CONTROL 10ML LL (SYRINGE) ×2 IMPLANT
TOWEL GREEN STERILE FF (TOWEL DISPOSABLE) ×4 IMPLANT
UNDERPAD 30X36 HEAVY ABSORB (UNDERPADS AND DIAPERS) ×2 IMPLANT

## 2019-12-28 NOTE — Anesthesia Preprocedure Evaluation (Addendum)
Anesthesia Evaluation  Patient identified by MRN, date of birth, ID band Patient awake    Reviewed: Allergy & Precautions, NPO status , Patient's Chart, lab work & pertinent test results, reviewed documented beta blocker date and time   Airway Mallampati: II  TM Distance: >3 FB Neck ROM: Full    Dental no notable dental hx. (+) Teeth Intact, Dental Advisory Given   Pulmonary asthma , sleep apnea (does not use CPAP) ,    Pulmonary exam normal breath sounds clear to auscultation       Cardiovascular hypertension, Pt. on medications and Pt. on home beta blockers + Peripheral Vascular Disease  Normal cardiovascular exam+ dysrhythmias (PVCs s/p ablation and Loop recorder insertion)  Rhythm:Regular Rate:Normal  RBBB  TTE 2017 - Normal LV systolic function; mild LAE; sclerotic aortic valve with small mobile density likey lambl&'s excrescence; trace AI; mild MR; patent foramen ovale noted.    Neuro/Psych  Headaches, PSYCHIATRIC DISORDERS Anxiety Parkinson's CVA, No Residual Symptoms    GI/Hepatic Neg liver ROS, GERD  ,  Endo/Other  negative endocrine ROS  Renal/GU negative Renal ROS  negative genitourinary   Musculoskeletal  (+) Arthritis ,   Abdominal   Peds  Hematology  (+) Blood dyscrasia (on plavix), ,   Anesthesia Other Findings   Reproductive/Obstetrics                            Anesthesia Physical Anesthesia Plan  ASA: III  Anesthesia Plan: MAC and Bier Block and Bier Block-LIDOCAINE ONLY   Post-op Pain Management:  Regional for Post-op pain   Induction: Intravenous  PONV Risk Score and Plan: 1 and Propofol infusion and Treatment may vary due to age or medical condition  Airway Management Planned: Natural Airway  Additional Equipment:   Intra-op Plan:   Post-operative Plan:   Informed Consent: I have reviewed the patients History and Physical, chart, labs and discussed the  procedure including the risks, benefits and alternatives for the proposed anesthesia with the patient or authorized representative who has indicated his/her understanding and acceptance.     Dental advisory given  Plan Discussed with: CRNA  Anesthesia Plan Comments:         Anesthesia Quick Evaluation

## 2019-12-28 NOTE — Anesthesia Postprocedure Evaluation (Signed)
Anesthesia Post Note  Patient: Joshua [name]  Procedure(s) Performed: RELEASE TRIGGER FINGER/A-1 PULLEY THUMB, MIDDLE AND RING (Left Hand)     Patient location during evaluation: PACU Anesthesia Type: MAC and Bier Block Level of consciousness: awake and alert Pain management: pain level controlled Vital Signs Assessment: post-procedure vital signs reviewed and stable Respiratory status: spontaneous breathing, nonlabored ventilation, respiratory function stable and patient connected to nasal cannula oxygen Cardiovascular status: stable and blood pressure returned to baseline Postop Assessment: no apparent nausea or vomiting Anesthetic complications: no   No complications documented.  Last Vitals:  Vitals:   12/28/19 1415 12/28/19 1425  BP: (!) 162/78 132/74  Pulse: 63 (!) 56  Resp: 17 18  Temp:  36.6 C  SpO2: 95% 96%    Last Pain:  Vitals:   12/28/19 1425  TempSrc:   PainSc: 0-No pain                 Liela Rylee L Toben Acuna

## 2019-12-28 NOTE — Progress Notes (Signed)
Patient with hx of Parkinson's. Right arm tremor. Left arm is surgical side. BP repeated.

## 2019-12-28 NOTE — Op Note (Signed)
NAME: Joshua [name] MEDICAL RECORD NO: [id number] DATE OF BIRTH: [birth year] FACILITY: [facility] LOCATION: [facility] PHYSICIAN: Wynonia Sours, MD   OPERATIVE REPORT   DATE OF PROCEDURE: 12/28/19    PREOPERATIVE DIAGNOSIS:   Stenosing tenosynovitis middle and ring fingers left hand  POSTOPERATIVE DIAGNOSIS:   Same   PROCEDURE:   Release A1 pulley left thumb middle and ring fingers left hand   SURGEON: Daryll Brod, M.D.   ASSISTANT: none   ANESTHESIA:   Bier block with sedation and local   INTRAVENOUS FLUIDS:  Per anesthesia flow sheet.   ESTIMATED BLOOD LOSS:  Minimal.   COMPLICATIONS:  None.   SPECIMENS:  none   TOURNIQUET TIME:    Total Tourniquet Time Documented: Forearm (Left) - 31 minutes Total: Forearm (Left) - 31 minutes    DISPOSITION:  Stable to PACU.   INDICATIONS: Patient is a 74 year old male with a history of multiple trigger digits including the thumb middle and ring fingers of his left hand.  He has triggering on his opposite hand he is elected undergo release of the A1 pulley of the left middle and ring fingers.  Pre-peripostoperative course been discussed along with risks and complications.  He is aware that there is no guarantee to the surgery the possibility of infection recurrence injury to arteries nerves tendons complete release symptoms dystrophy.  In the preoperative area the patient seen the extremity marked by both patient and surgeon antibiotic given  OPERATIVE COURSE: Patient brought the operating room forearm-based IV regional anesthetic was carried out without difficulty under the direction the anesthesia department.  Was prepped using ChloraPrep in the supine position with left arm free.  3-minute dry time was allowed timeout taken to confirm patient procedure.  The thumb was attended to first.  Transverse incision was made over the metacarpal phalangeal joint crease of the left thumb carried down through subcutaneous tissue.   Neurovascular structures identified protected with retractors the A1 pulley was released on its radial aspect preserving the oblique pulley.  Tenosynovial tissue proximally was separated with blunt dissection the thumb placed through a full passive range of motion no further triggering was noted.  This was irrigated and closed with interrupted 4-0 nylon sutures.  The middle and ring fingers were attended to next middle finger first and oblique incision was made over the A1 pulley of the left middle finger carried down to subcutaneous tissue.  The A1 pulley was identified retractors were placed protecting neurovascular structures and the A1 pulley was released on its radial aspect a small incision made centrally and A2 tenosynovial tissue proximally was separated with blunt dissection.  2 right angle retractors were then used to separate the 2 tendons breaking adhesions between them.  The finger was placed through full passive range of motion no further trigger was noted.  The ring finger was attended to next an oblique incision made over the A1 pulley of the ring finger carried down through subcutaneous tissue.  The dissection carried down the A1 pulley and the A1 pulley was released on its radial aspect a small incision made centrally and A2 the tenosynovium tissue separated with blunt dissection proximally.  The 2 tendons were then separated with a right angle retractors breaking any adhesions.  The finger placed through a full passive range of motion no further trigger was noted.  The incisions on both middle and ring fingers were then closed erupted 4 nylon sutures.  Local infiltration with quarter percent bupivacaine without epinephrine  was given approximately 10 cc was used total.  A sterile compressive dressing with the fingers 3 was applied.  Deflation of the tourniquet all fingers immediately pink.  Was taken to the recovery room for observation in satisfactory condition.  He will be discharged home to  return to the hand center of [facility] in 1 week on Tylenol ibuprofen for pain with Ultram for breakthrough.   Daryll Brod, MD Electronically signed, 12/28/19

## 2019-12-28 NOTE — Transfer of Care (Signed)
Immediate Anesthesia Transfer of Care Note  Patient: Fed Ceci  Procedure(s) Performed: RELEASE TRIGGER FINGER/A-1 PULLEY THUMB, MIDDLE AND RING (Left Hand)  Patient Location: PACU  Anesthesia Type:MAC and Bier block  Level of Consciousness: awake, alert  and oriented  Airway & Oxygen Therapy: Patient Spontanous Breathing and Patient connected to face mask oxygen  Post-op Assessment: Report given to RN and Post -op Vital signs reviewed and stable  Post vital signs: Reviewed and stable  Last Vitals:  Vitals Value Taken Time  BP 135/81 12/28/19 1355  Temp    Pulse 57 12/28/19 1357  Resp 17 12/28/19 1357  SpO2 94 % 12/28/19 1357  Vitals shown include unvalidated device data.  Last Pain:  Vitals:   12/28/19 1101  TempSrc: Oral  PainSc: 6       Patients Stated Pain Goal: 6 (88/33/74 4514)  Complications: No complications documented.

## 2019-12-28 NOTE — Discharge Instructions (Addendum)
[facility] Instructions Hand Surgery  Wound Care: Keep your hand elevated above the level of your heart.  Do not allow it to dangle by your side.  Keep the dressing dry and do not remove it unless your doctor advises you to do so.  He will usually change it at the time of your post-op visit.  Moving your fingers is advised to stimulate circulation but will depend on the site of your surgery.  If you have a splint applied, your doctor will advise you regarding movement.  Activity: Do not drive or operate machinery today.  Rest today and then you may return to your normal activity and work as indicated by your physician.  Diet:  Drink liquids today or eat a light diet.  You may resume a regular diet tomorrow.    General expectations: Pain for two to three days. Fingers may become slightly swollen.  Call your doctor if any of the following occur: Severe pain not relieved by pain medication. Elevated temperature. Dressing soaked with blood. Inability to move fingers. White or bluish color to fingers.  Post Anesthesia Home Care Instructions  Activity: Get plenty of rest for the remainder of the day. A responsible individual must stay with you for 24 hours following the procedure.  For the next 24 hours, DO NOT: -Drive a car -Paediatric nurse -Drink alcoholic beverages -Take any medication unless instructed by your physician -Make any legal decisions or sign important papers.  Meals: Start with liquid foods such as gelatin or soup. Progress to regular foods as tolerated. Avoid greasy, spicy, heavy foods. If nausea and/or vomiting occur, drink only clear liquids until the nausea and/or vomiting subsides. Call your physician if vomiting continues.  Special Instructions/Symptoms: Your throat may feel dry or sore from the anesthesia or the breathing tube placed in your throat during surgery. If this causes discomfort, gargle with warm salt water. The discomfort should disappear within  24 hours.  If you had a scopolamine patch placed behind your ear for the management of post- operative nausea and/or vomiting:  1. The medication in the patch is effective for 72 hours, after which it should be removed.  Wrap patch in a tissue and discard in the trash. Wash hands thoroughly with soap and water. 2. You may remove the patch earlier than 72 hours if you experience unpleasant side effects which may include dry mouth, dizziness or visual disturbances. 3. Avoid touching the patch. Wash your hands with soap and water after contact with the patch.    No Tylenol until 5:00 pm.

## 2019-12-28 NOTE — Anesthesia Procedure Notes (Signed)
Anesthesia Regional Block: Bier block (IV Regional)   Pre-Anesthetic Checklist: ,, timeout performed, Correct Patient, Correct Site, Correct Laterality, Correct Procedure,, site marked, surgical consent,, at surgeon's request  Laterality: Left     Needles:  Injection technique: Single-shot  Needle Type: Other      Needle Gauge: 22     Additional Needles:   Procedures:,,,,, intact distal pulses, Esmarch exsanguination, single tourniquet utilized,  Narrative:  Start time: 12/28/2019 1:15 PM End time: 12/28/2019 1:16 PM  Performed by: Personally

## 2019-12-28 NOTE — H&P (Signed)
Joshua [name] is an 74 y.o. male.   Chief Complaint:catching left thumb, middle and ring fingers HPI: Joshua [name] is a 74 yo male complaining of multiple trigger digits and CMC arthritis. He was last seen on 11/27/2019. He had an injection to the carpometacarpal joint right wrist at that time. His trigger fingers include the right middle left middle ring and thumb. He has a slack wrist on his right side. He continues to complain of pain on his right wrist. He states the triggering continues on his left thumb middle and ring. He would like to proceed to have the left side corrected as much as possible. He has no no history of injury. He has a history of arthritis no history of diabetes thyroid problems or gout.       Past Medical History:  Diagnosis Date  . Allergic rhinitis   . Allergy   . Anal fissure   . Anxiety    from chronic pain from surgery- on Cymbalta  . Arthritis   . Asthma   . Barrett's esophagus 03/29/2014  . Carotid artery disease ([place])    Carotid Doppler normal August, 2007  . Cataract   . Chest pain    Coronaries normal 1996 /  nuclear..06/2002..normal...EF  56% /  stress echo.. May, 2011.... no  scar or ischemia... rate related RBBB  . Diverticulosis   . Dyslipidemia   . Ejection fraction    EF 60%, stress echo, May, 2011  . GERD (gastroesophageal reflux disease)   . Headache   . History of loop recorder    has since 04/04/15  . HTN (hypertension)    takes Metoprolol for PVC control  . Hx of colonic polyps    adenomatous  . Hx of colonoscopy   . Hyperlipidemia   . IFG (impaired fasting glucose)   . Palpitations    Benign PVCs  . Parkinson disease ([place])   . Prostate cancer ([place])   . RBBB (right bundle branch block)    rate related  . Shingles   . Sleep apnea    pt denies  . Stroke [facility])    TIA  . TIA (transient ischemic attack) 02/2015   Per pt, had 2 strokes  . Tremor    Hand tremor  . Vertigo     Past Surgical History:  Procedure Laterality  Date  . BACK SURGERY  2002,2009   x 6  . CHOLECYSTECTOMY  11/30/2012   with IOC  . COLONOSCOPY    . ELECTROPHYSIOLOGIC STUDY N/A 09/26/2015   Procedure: V Tach Ablation (PVC);  Surgeon: Will Meredith Leeds, MD;  Location: [facility];  Service: Cardiovascular;  Laterality: N/A;  . EP IMPLANTABLE DEVICE N/A 04/04/2015   Procedure: Loop Recorder Insertion;  Surgeon: Will Meredith Leeds, MD;  Location: [facility];  Service: Cardiovascular;  Laterality: N/A;  . HERNIA REPAIR     laprascopic  . KNEE SURGERY     DECEMBER 2017,LEFT KNEE SCOPED  . NECK SURGERY  2002  . POLYPECTOMY    . ROTATOR CUFF REPAIR Left   . TEE WITHOUT CARDIOVERSION N/A 04/04/2015   Procedure: TRANSESOPHAGEAL ECHOCARDIOGRAM (TEE);  Surgeon: Lelon Perla, MD;  Location[facility] ENDOSCOPY;  Service: Cardiovascular;  Laterality: N/A;  . UPPER GASTROINTESTINAL ENDOSCOPY    . V Tach ablation  09/26/2015    Family History  Problem Relation Age of Onset  . Heart attack Father   . Heart disease Father   . Clotting disorder Father   .  Heart disease Brother   . Hypertension Mother   . Breast cancer Paternal Grandmother   . Heart attack Paternal Grandfather   . Healthy Daughter   . Healthy Daughter   . Esophageal cancer Neg Hx   . Stomach cancer Neg Hx   . Rectal cancer Neg Hx   . Colon polyps Neg Hx    Social History:  reports that he has never smoked. He has never used smokeless tobacco. He reports that he does not drink alcohol and does not use drugs.  Allergies:  Allergies  Allergen Reactions  . Floxin I.V. In Dextrose 5% [Ofloxacin] Other (See Comments)    Lowers BP Lowers BP  . Terazosin Other (See Comments)    No medications prior to admission.    No results found for this or any previous visit (from the past 48 hour(s)).  No results found.   Pertinent items are noted in HPI.  Height 6\' 1"  (1.854 m), weight 98.9 kg.  General appearance: alert, cooperative and appears stated  age Head: Normocephalic, without obvious abnormality Neck: no JVD Resp: clear to auscultation bilaterally Cardio: regular rate and rhythm, S1, S2 normal, no murmur, click, rub or gallop GI: soft, non-tender; bowel sounds normal; no masses,  no organomegaly Extremities: Catching left thumb middle and ring fingers Pulses: 2+ and symmetric Skin: Skin color, texture, turgor normal. No rashes or lesions Neurologic: Grossly normal Incision/Wound: na  Assessment/Plan Diagnosis slack wrist with CMC arthritis right and triggering thumb middle and ring left   Plan: He would like to undergo surgical intervention on his left side and attempt to correct this. Preperi-and postoperative course been discussed along with risk and complications. He is aware that there is no guarantee to the surgery the possibility of infection recurrence injury to arteries nerves tendons complete relief symptoms dystrophy. He is scheduled for release A1 pulley left thumb middle and ring fingers as an outpatient under regional anesthesia.     Daryll Brod 12/28/2019, 8:52 AM

## 2019-12-29 ENCOUNTER — Encounter (HOSPITAL_BASED_OUTPATIENT_CLINIC_OR_DEPARTMENT_OTHER): Payer: Self-pay | Admitting: Orthopedic Surgery

## 2020-01-02 ENCOUNTER — Other Ambulatory Visit: Payer: Self-pay

## 2020-01-12 ENCOUNTER — Other Ambulatory Visit: Payer: Self-pay | Admitting: Neurology

## 2020-01-15 NOTE — Progress Notes (Signed)
Assessment/Plan:   1.  Parkinsons Disease, possibly ET/PD  -Take carbidopa/levodopa 25/100,2 tablets at 7 AM/2 tablets at 11 AM/continue 1 tablet at3 PM/1 tablet 7 PM  -Continue carbidopa/levodopa 50/200 at bedtime  -May repeat levodopa challenge in the future, especially if we consider surgical interventions.  We did this several years ago, but at that point in time he only had tremor (and he has levodopa resistant tremor).  He has since developed more bradykinesia.  In addition, we may retry pramipexole  -encouraged exercise  2.  History of watershed infarct  -Has a loop recorder but patient reports no longer working  -On Plavix  3.  History of multiple syncopal episodes, likely due to [facility]  -Off of propranolol and no further syncope but still with lightheadedness/fatigue  -start midodrine 5 mg bid.  Did tell him it may not help fatigue but should help the BP.  Discussed risk of supine HTN.  Raise HOB.    4.  Low back pain  -Has had surgical interventions in the past.  Has followed with Dr. Brien Few in the past.  Just recently had injection into hip and that helped. Subjective:   Joshua [name] was seen today in follow up for Parkinsons disease.  My previous records were reviewed prior to todays visit as well as outside records available to me.  Wife with patient and supplements the hx.  His levodopa was increased last visit.  He reports that he did well with that but it didn't help tremor.  He is feeling really tired.  pt denies falls but some near falls.  Uses cane when out.  Pt with some dizziness but no near syncope.  No hallucinations.  Mood has been good.  Patient has had trigger finger surgery since last visit.  Those notes have been reviewed.  Current prescribed movement disorder medications: Carbidopa/levodopa 25/100, 2 tablets at 7 AM/2 tablets at 11 AM/continue 1 tablet at3 PM/1 tablet 7 PM Carbidopa/levodopa 50/200 at bedtime   PREVIOUS MEDICATIONS: Pramipexole (just  was not helpful for tremor); primidone; propranolol; metoprolol  ALLERGIES:   Allergies  Allergen Reactions  . Floxin I.V. In Dextrose 5% [Ofloxacin] Other (See Comments)    Lowers BP Lowers BP  . Terazosin Other (See Comments)    CURRENT MEDICATIONS:  Outpatient Encounter Medications as of 01/16/2020  Medication Sig  . carbidopa-levodopa (SINEMET CR) 50-200 MG tablet TAKE 1 TABLET BY MOUTH EVERYDAY AT BEDTIME  . carbidopa-levodopa (SINEMET IR) 25-100 MG tablet TAKE 2 TABLETS AT 7AM, 2 TABLETS AT 11AM, 1 TABLET AT 3PM AND 1 TABLET AT 7PM  . clopidogrel (PLAVIX) 75 MG tablet Take 1 tablet (75 mg total) by mouth daily.  . Cyanocobalamin (VITAMIN B 12 PO) Take by mouth.  . DYMISTA 137-50 MCG/ACT SUSP Place 1 puff into both nostrils at bedtime.   [name] escitalopram (LEXAPRO) 10 MG tablet Take 10 mg by mouth daily.  . finasteride (PROSCAR) 5 MG tablet Take 5 mg by mouth Daily.  . flecainide (TAMBOCOR) 150 MG tablet TAKE 1/2 TABLET BY MOUTH EVERY 12 HOURS  . levocetirizine (XYZAL) 5 MG tablet Take 5 mg by mouth every evening.    . montelukast (SINGULAIR) 10 MG tablet Take 10 mg by mouth at bedtime.    . Omega-3 Fatty Acids (FISH OIL) 1000 MG CAPS Take 1 capsule by mouth daily.  [name] PROCTOSOL HC 2.5 % rectal cream APPLY INTO AND AROUND RECTUM 2 TIMES A DAY  . rosuvastatin (CRESTOR) 5 MG tablet Take  5 mg by mouth daily.   . tamsulosin (FLOMAX) 0.4 MG CAPS capsule Take 0.4 mg by mouth daily after supper.   . traMADol (ULTRAM) 50 MG tablet Take 50 mg by mouth every 6 (six) hours as needed. for pain  . zolpidem (AMBIEN) 10 MG tablet Take 10 mg by mouth at bedtime.  . [DISCONTINUED] carbidopa-levodopa (SINEMET CR) 50-200 MG tablet Take 1 tablet by mouth at bedtime.  . [DISCONTINUED] carbidopa-levodopa (SINEMET IR) 25-100 MG tablet 2 tablets at 7am/2 at 11am, 1 at 3pm and 1 at 7pm  . [DISCONTINUED] methocarbamol (ROBAXIN) 500 MG tablet TAKE 1 TABLET (500 MG TOTAL) BY MOUTH FOUR (4) TIMES A DAY. (Patient  not taking: Reported on 01/16/2020)  . [DISCONTINUED] traMADol (ULTRAM) 50 MG tablet Take 1 tablet (50 mg total) by mouth every 6 (six) hours as needed. (Patient not taking: Reported on 01/16/2020)   No facility-administered encounter medications on file as of 01/16/2020.    Objective:   PHYSICAL EXAMINATION:    VITALS:   Vitals:   01/16/20 1525  BP: 96/60  Pulse: 82  SpO2: 99%  Weight: 221 lb (100.2 kg)  Height: 6\' 1"  (1.854 m)    GEN:  The patient appears stated age and is in NAD. HEENT:  Normocephalic, atraumatic.  The mucous membranes are moist. The superficial temporal arteries are without ropiness or tenderness. CV:  RRR Lungs:  CTAB Neck/HEME:  There are no carotid bruits bilaterally.  Neurological examination:  Orientation: The patient is alert and oriented x3. Cranial nerves: There is good facial symmetry with facial hypomimia. The speech is fluent and clear. Soft palate rises symmetrically and there is no tongue deviation. Hearing is intact to conversational tone. Sensation: Sensation is intact to light touch throughout Motor: Strength is at least antigravity x4.  Movement examination: Tone: There is mild increased tone in the rue Abnormal movements: there is RUE>LUE rest tremor Coordination:  There is mild decremation with RAM's, with finger taps r>l Gait and Station: The patient has no difficulty arising out of a deep-seated chair without the use of the hands. The patient is forward flexed with decreased arm swing, but he is steady.  I have reviewed and interpreted the following labs independently    Chemistry      Component Value Date/Time   NA 138 09/18/2015 1135   NA 143 04/04/2015 1501   K 4.4 09/18/2015 1135   CL 105 09/18/2015 1135   CO2 22 09/18/2015 1135   BUN 19 09/18/2015 1135   BUN 19 04/04/2015 1501   CREATININE 0.88 09/18/2015 1135      Component Value Date/Time   CALCIUM 9.4 09/18/2015 1135   ALKPHOS 52 08/25/2015 1140   AST 23 08/25/2015  1140   ALT 22 08/25/2015 1140   BILITOT 1.1 08/25/2015 1140   BILITOT 0.7 04/04/2015 1501       Lab Results  Component Value Date   WBC 8.4 09/18/2015   HGB 14.8 09/18/2015   HCT 42.1 09/18/2015   MCV 89.4 09/18/2015   PLT 200 09/18/2015    Lab Results  Component Value Date   TSH 1.240 04/04/2015     Total time spent on today's visit was 40 minutes, including both face-to-face time and nonface-to-face time.  Time included that spent on review of records (prior notes available to me/labs/imaging if pertinent), discussing treatment and goals, answering patient's questions and coordinating care.  Cc:  04/06/2015, MD

## 2020-01-16 ENCOUNTER — Encounter: Payer: Self-pay | Admitting: Neurology

## 2020-01-16 ENCOUNTER — Ambulatory Visit (INDEPENDENT_AMBULATORY_CARE_PROVIDER_SITE_OTHER): Payer: Medicare HMO | Admitting: Neurology

## 2020-01-16 ENCOUNTER — Other Ambulatory Visit: Payer: Self-pay

## 2020-01-16 VITALS — BP 96/60 | HR 82 | Ht 73.0 in | Wt 221.0 lb

## 2020-01-16 DIAGNOSIS — R5383 Other fatigue: Secondary | ICD-10-CM

## 2020-01-16 DIAGNOSIS — G903 Multi-system degeneration of the autonomic nervous system: Secondary | ICD-10-CM | POA: Diagnosis not present

## 2020-01-16 DIAGNOSIS — G20 Parkinson's disease: Secondary | ICD-10-CM | POA: Diagnosis not present

## 2020-01-16 MED ORDER — MIDODRINE HCL 5 MG PO TABS: 5.0000 mg | ORAL_TABLET | Freq: Two times a day (BID) | ORAL | 1 refills | Status: DC

## 2020-01-16 NOTE — Patient Instructions (Signed)
1.  Start midodrine - 5 mg - 1 tablet in the AM and at noon with a meal.  Take your Blood pressure in various positions.  Raise head of bed 2.  Exercise!  Parkinsons [employer]   . Online Resources for Power over Parkinson's Group . Local  Online Groups  o Power over [employer] Group :   - Power Over Parkinson's Patient Education Group will be Wednesday, December 8th at 2pm via Zoom.   - Upcoming Power over Parkinson's Meetings:  2nd Wednesdays of the month at 2 pm:       January 12th, February 9th - Amy Marriott, PT at [facility] has resumed the lead of this group starting in July.  Contact Amy at [email or web address] if interested in participating in this online group o Parkinson's Care Partners Group:    3rd Mondays, Contact Corwin Levins o Atypical Parkinsonian Patient Group:   4th Wednesdays, Contact Corwin Levins o If you are interested in participating in these online groups with Judson Roch, please contact her directly for how to join those meetings.  Her contact information is [email or web address].  She will send you a link to join the [employer].  (Please note that Corwin Levins , MSW, LCSW, has resigned her position at [facility], but will continue to lead the online groups temporarily) .  [name] [place]:  [email or web address] o PD Health at Home continues:  Mindfulness Mondays, Expert Briefing Tuesdays, Wellness Wednesdays, Take Time Thursdays, Fitness Fridays  o [employer]:  (Next one is February 2022, stay tuned) o Please check out their website to sign up for emails and see their full online offerings .  [name] [place]:  [email or web address]  o Upcoming Webinar:   Stay tuned for 2022 o Check out additional information on their website to see their full online offerings .  [name] [place]:  [email or web address] o Upcoming Webinar:  Stay tuned for 2022 o Care  Partner Monthly Meetup.  With Robin Searing [place].  First Tuesday of each month, 2 pm o Check out additional information to Live Well Today on their website .  [name] Parkinson and Movement Disorders (PMD) Alliance:  www.pmdalliance.org o [employer] Online:  Online Education Events o Sign up for emails, which are sent weekly to give you updates on programming and online offerings .  [name] Parkinson's Association of the [place]:  www.[email or web address] o Information on online support groups, online exercises including Yoga, Parkinson's exercises and more-LOTS of information on links to PD resources and online events o Virtual Support Group through Parkinson's Association of the [place]; next one is scheduled for Wednesday, December 13, 2019 at 2 pm. (These are typically scheduled for the 1st Wednesday of the month at 2 pm).  Visit website for details. .  . Additional links for movement activities: o PWR! Moves Classes at [place] RESUMED, at a limited capacity.  We have several openings for Wednesday 10 am and 11 am classes.  Contact Amy Marriott, PT [email or web address] or [phone] if interested o Here is a link to the PWR!Moves classes on Zoom from [place] - Daily Mon-Sat at 10:00. Via Zoom, FREE and open to all.  There is also a link below via Facebook if you use that platform. - [email or web address].si - https://www.PrepaidParty.no o Parkinson's Wellness Recovery (PWR! Moves)  www.pwr4life.org - Info on the PWR! Virtual Experience:  You will have access to our expertise through self-assessment, guided plans that start with the PD-specific fundamentals, educational  content, tips, Q&A with an expert, and a growing Engineering geologist of PD-specific pre-recorded and live exercise  classes of varying types and intensity - both physical and cognitive! If that is not enough, we offer 1:1 wellness consultations (in-person or virtual) to personalize your PWR! Dance movement psychotherapist.  - Check out the PWR! Move of the month on the Parkinson Wellness Recovery website:  SearchPrisoners.de o [employer]  Fridays:  - As part of the PD Health @ Home program, this free video series focuses each week on one aspect of fitness designed to support people living with Parkinson's.  -  http://www.morris.[email or web address]/ o Dance for PD website is offering free, live-stream classes throughout the week, as well as links to [employer] of classes:  [email or web address]/ o Hotel manager for Parkinson's Class:  Dance Project of [place] is back this Fall!  Free offering for people with Parkinson's and care partners; virtual class this Fall. The class will be Wednesdays 4-5pm beginning 10/13.  Classes will run for 9 weeks 10/13-12/15,.  Register below: o https://[email or web address].[email or web address]/danceprojectinc/[email or web address]?[email or web address]&SEASON=Parkinsons+Dance-Fall+2021  o For more information, contact [phone] or email Allena Napoleon at [email or web address] o Virtual dance and Pilates for Parkinson's classes: Click on the Community Tab> Parkinson's Movement Initiative Tab.  To register for classes and for more information, visit www.[email or web address].co.za and click the "community" tab.  o [employer] Parkinson's Cycling Classes  - [facility]: 1pm on Fridays-Live classes at [facility] [employer] at [email or web address] or [phone]) [employer]: Virtual Classes Mondays and Thursdays (contact [place] at [email or web address] or [phone]) .  o [employer] - Three levels of classes are offered Tuesdays and Thursdays:  10:30  am,  12 noon & 1:45 pm at [employer]. To observe a class or for  more information, call [phone] or email [email or web address] . [employer]: o Financial trader Opportunities:  www.well-springsolutions.org/caregiver-education/caregiver-support-group.  You may also contact Loleta Chance at [email or web address] or [phone].   o Caregiver Virtual Event:   [facility] is having a [phone], Thursday, December 9th from 6-7 pm - Contact [birth year]-1979 (above) for details o Well[facility] Navigator:  Just1Navigator program, a free service to help individuals and families through the journey of determining care for older adults.  The "Navigator" is a Loleta Chance, Child psychotherapist, who will speak with a prospective client and/or loved ones to provide an assessment of the situation and a set of recommendations for a personalized care plan - all free of charge, and whether [facility] [employer] offers the needed service or not. If the need is not a service we provide, we are well-connected with reputable programs in town that we can refer you to.  www.well-springsolutions.org or to speak with the Navigator, call [phone].

## 2020-01-22 ENCOUNTER — Other Ambulatory Visit: Payer: Self-pay

## 2020-01-22 ENCOUNTER — Encounter: Payer: Self-pay | Admitting: Cardiology

## 2020-01-22 ENCOUNTER — Ambulatory Visit (INDEPENDENT_AMBULATORY_CARE_PROVIDER_SITE_OTHER): Payer: Medicare HMO | Admitting: Cardiology

## 2020-01-22 VITALS — BP 108/78 | HR 84 | Ht 73.0 in | Wt 222.2 lb

## 2020-01-22 DIAGNOSIS — I493 Ventricular premature depolarization: Secondary | ICD-10-CM

## 2020-01-22 MED ORDER — FLECAINIDE ACETATE 50 MG PO TABS: 50.0000 mg | ORAL_TABLET | Freq: Two times a day (BID) | ORAL | 2 refills | Status: DC

## 2020-01-22 NOTE — Progress Notes (Signed)
Electrophysiology Office Note   Date:  01/22/2020   ID:  Kamare, Menton [birth year], MRN [id number]  PCP:  Marton Redwood, MD  Primary Electrophysiologist:  Constance Haw, MD    No chief complaint on file.    History of Present Illness: Yohanes Kobs is a 75 y.o. male who presents today for electrophysiology evaluation.     He has a history of CVA, obstructive sleep apnea, hyperlipidemia, GERD, hypertension, right bundle branch block.  The stroke occurred February 2017.  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  Linq monitor was implanted at that time.  He was found to have a 26% PVC burden and had ablation of PVCs, though PVCs recurred and he was put on flecainide.  Today, denies symptoms of palpitations, chest pain, shortness of breath, orthopnea, PND, lower extremity edema, claudication, dizziness, presyncope, syncope, bleeding, or neurologic sequela. The patient is tolerating medications without difficulties.  He currently feels well.  He is having no chest pain or shortness of breath.  He is unaware of palpitations.  He is ready to have his Linq monitor explanted.   Past Medical History:  Diagnosis Date  . Allergic rhinitis   . Allergy   . Anal fissure   . Anxiety    from chronic pain from surgery- on Cymbalta  . Arthritis   . Asthma   . Barrett's esophagus 03/29/2014  . Carotid artery disease ([place])    Carotid Doppler normal August, 2007  . Cataract   . Chest pain    Coronaries normal 1996 /  nuclear..06/2002..normal...EF  56% /  stress echo.. May, 2011.... no  scar or ischemia... rate related RBBB  . Diverticulosis   . Dyslipidemia   . Ejection fraction    EF 60%, stress echo, May, 2011  . GERD (gastroesophageal reflux disease)   . Headache   . History of loop recorder    has since 04/04/15  . HTN (hypertension)    takes Metoprolol for PVC control  . Hx of colonic  polyps    adenomatous  . Hx of colonoscopy   . Hyperlipidemia   . IFG (impaired fasting glucose)   . Palpitations    Benign PVCs  . Parkinson disease ([place])   . Prostate cancer ([place])   . RBBB (right bundle branch block)    rate related  . Shingles   . Stroke [facility])    TIA  . TIA (transient ischemic attack) 02/2015   Per pt, had 2 strokes  . Tremor    Hand tremor  . Vertigo    Past Surgical History:  Procedure Laterality Date  . BACK SURGERY  2002,2009   x 6  . CHOLECYSTECTOMY  11/30/2012   with IOC  . COLONOSCOPY    . ELECTROPHYSIOLOGIC STUDY N/A 09/26/2015   Procedure: V Tach Ablation (PVC);  Surgeon: Rheba Diamond Meredith Leeds, MD;  Location: [place] CV LAB;  Service: Cardiovascular;  Laterality: N/A;  . EP IMPLANTABLE DEVICE N/A 04/04/2015   Procedure: Loop Recorder Insertion;  Surgeon: Destinae Neubecker Meredith Leeds, MD;  Location: [place] CV LAB;  Service: Cardiovascular;  Laterality: N/A;  . HERNIA REPAIR     laprascopic  . KNEE SURGERY     DECEMBER 2017,LEFT KNEE SCOPED  . NECK SURGERY  2002  . POLYPECTOMY    . ROTATOR CUFF REPAIR Left   . TEE WITHOUT CARDIOVERSION N/A 04/04/2015  Procedure: TRANSESOPHAGEAL ECHOCARDIOGRAM (TEE);  Surgeon: Lelon Perla, MD;  Location: [facility] ENDOSCOPY;  Service: Cardiovascular;  Laterality: N/A;  . TRIGGER FINGER RELEASE Left 12/28/2019   Procedure: RELEASE TRIGGER FINGER/A-1 PULLEY THUMB, MIDDLE AND RING;  Surgeon: Daryll Brod, MD;  Location: [place];  Service: Orthopedics;  Laterality: Left;  FAB  . UPPER GASTROINTESTINAL ENDOSCOPY    . V Tach ablation  09/26/2015     Current Outpatient Medications  Medication Sig Dispense Refill  . acetaminophen (TYLENOL) 500 MG tablet Take 1-2 tablets by mouth as needed.    . carbidopa-levodopa (SINEMET CR) 50-200 MG tablet TAKE 1 TABLET BY MOUTH EVERYDAY AT BEDTIME 30 tablet 0  . carbidopa-levodopa (SINEMET IR) 25-100 MG tablet TAKE 2 TABLETS AT 7AM, 2 TABLETS AT 11AM, 1 TABLET AT 3PM  AND 1 TABLET AT 7PM 180 tablet 0  . clopidogrel (PLAVIX) 75 MG tablet Take 1 tablet (75 mg total) by mouth daily. 30 tablet 2  . Cyanocobalamin (VITAMIN B 12 PO) Take by mouth.    . DYMISTA 137-50 MCG/ACT SUSP Place 1 puff into both nostrils at bedtime.     [name] escitalopram (LEXAPRO) 10 MG tablet Take 10 mg by mouth daily.    . finasteride (PROSCAR) 5 MG tablet Take 5 mg by mouth Daily.    . flecainide (TAMBOCOR) 50 MG tablet Take 1 tablet (50 mg total) by mouth 2 (two) times daily. 180 tablet 2  . lansoprazole (PREVACID) 30 MG capsule Take 30 mg by mouth 2 (two) times daily.    [name] levocetirizine (XYZAL) 5 MG tablet Take 5 mg by mouth every evening.      . midodrine (PROAMATINE) 5 MG tablet Take 1 tablet (5 mg total) by mouth 2 (two) times daily with a meal. 180 tablet 1  . montelukast (SINGULAIR) 10 MG tablet Take 10 mg by mouth at bedtime.      . Omega-3 Fatty Acids (FISH OIL) 1000 MG CAPS Take 1 capsule by mouth daily.    [name] PROCTOSOL HC 2.5 % rectal cream APPLY INTO AND AROUND RECTUM 2 TIMES A DAY 28.35 g 0  . rosuvastatin (CRESTOR) 5 MG tablet Take 5 mg by mouth daily.     . tamsulosin (FLOMAX) 0.4 MG CAPS capsule Take 0.4 mg by mouth daily after supper.     . traMADol (ULTRAM) 50 MG tablet Take 50 mg by mouth every 6 (six) hours as needed. for pain  0  . zolpidem (AMBIEN) 10 MG tablet Take 10 mg by mouth at bedtime.     No current facility-administered medications for this visit.    Allergies:   Floxin i.v. in dextrose 5% [ofloxacin] and Terazosin   Social History:  The patient  reports that he has never smoked. He has never used smokeless tobacco. He reports that he does not drink alcohol and does not use drugs.   Family History:  The patient's family history includes Breast cancer in his paternal grandmother; Clotting disorder in his father; Healthy in his daughter and daughter; Heart attack in his father and paternal grandfather; Heart disease in his brother and father; Hypertension in  his mother.   ROS:  Please see the history of present illness.   Otherwise, review of systems is positive for none.   All other systems are reviewed and negative.   PHYSICAL EXAM: VS:  BP 108/78   Pulse 84   Ht 6\' 1"  (1.854 m)   Wt 222 lb 3.2 oz (100.8 kg)  SpO2 98%   BMI 29.32 kg/m  , BMI Body mass index is 29.32 kg/m. GEN: Well nourished, well developed, in no acute distress  HEENT: normal  Neck: no JVD, carotid bruits, or masses Cardiac: RRR; no murmurs, rubs, or gallops,no edema  Respiratory:  clear to auscultation bilaterally, normal work of breathing GI: soft, nontender, nondistended, + BS MS: no deformity or atrophy  Skin: warm and dry, device site well healed Neuro:  Strength and sensation are intact Psych: euthymic mood, full affect  EKG:  EKG is not ordered today. Personal review of the ekg ordered 12/25/19 shows sinus rhythm, right bundle branch block, PVC  Personal review of the device interrogation today. Results in [place]: No results found for requested labs within last 8760 hours.    Lipid Panel     Component Value Date/Time   CHOL 137 03/06/2015 0550   TRIG 105 03/06/2015 0550   HDL 41 03/06/2015 0550   CHOLHDL 3.3 03/06/2015 0550   VLDL 21 03/06/2015 0550   LDLCALC 75 03/06/2015 0550     Wt Readings from Last 3 Encounters:  01/22/20 222 lb 3.2 oz (100.8 kg)  01/16/20 221 lb (100.2 kg)  12/28/19 215 lb 13.3 oz (97.9 kg)      Other studies Reviewed: Additional studies/ records that were reviewed today include: TEE 04/04/15  Review of the above records today demonstrates:  - Left ventricle: Systolic function was normal. The estimated  ejection fraction was in the range of 50% to 55%. Wall motion was  normal; there were no regional wall motion abnormalities. - Aortic valve: No evidence of vegetation. There was trivial  regurgitation. - Mitral valve: No evidence of vegetation. There was mild  regurgitation. - Left atrium:  The atrium was mildly dilated. No evidence of  thrombus in the atrial cavity or appendage. - Right atrium: No evidence of thrombus in the atrial cavity or  appendage. - Atrial septum: There was a patent foramen ovale. - Tricuspid valve: No evidence of vegetation. - Pulmonic valve: No evidence of vegetation.  Holter 06/26/15 Average HR: 88 bpm Minimum HR: 56 bpm Maximum HR: 128 bpm  26% PVCs  ASSESSMENT AND PLAN:  1.  PVCs: Status post ablation with intermittent suppression.  Currently on flecainide with a reduced burden.  No changes.  He has a right bundle branch block on his flecainide, high risk medication monitoring.  We Laquisha Northcraft decrease to 50 mg.  2.  Hypertension: Currently well controlled.  No changes.    3. cryptogenic stroke: Linq monitor battery end-of-life.  At this point he would like the device explanted.  Risks and benefits have been discussed when to bleeding and infection.  He understands these risks and has agreed to the procedure.  Current medicines are reviewed at length with the patient today.   The patient does not have concerns regarding his medicines.  The following changes were made today: Decrease flecainide.  Labs/ tests ordered today include:  No orders of the defined types were placed in this encounter.    Disposition:   FU with Evelia Waskey 6 months   Signed, Kyrel Leighton Meredith Leeds, MD  01/22/2020 10:35 AM     [facility] [address] [place] [address] [phone] (office)  SURGEON:  Allegra Lai, MD     PREPROCEDURE DIAGNOSIS: Cryptogenic stroke    POSTPROCEDURE DIAGNOSIS: Cryptogenic stroke     PROCEDURES:   1. Implantable loop recorder implantation    INTRODUCTION:  Hiran Leard is a 75 y.o. male with a history of unexplained stroke who presents today for implantable loop explant.  he has had a LINQ monitor.  LINQ is now at [facility] and wishes the monitor explanted.     DESCRIPTION OF PROCEDURE:  Informed  written consent was obtained, and the patient was brought to the electrophysiology lab in a fasting state.  The patient required no sedation for the procedure today.  The patients left chest was therefore prepped and draped in the usual sterile fashion by the EP lab staff. The skin overlying the left parasternal region was infiltrated with lidocaine for local analgesia.  A 0.5-cm incision was made over the left parasternal region over the existing LINQ monitor.  Using a combination of sharp and blount dissection, the LINQ monitor was removed from the pocket.  Steri- Strips and a sterile dressing were then applied.  There were no early apparent complications.     CONCLUSIONS:   1. Successful explant of a [employer] Reveal LINQ implantable loop recorder for cryptogenic stroke  2. No early apparent complications.   [phone] (fax)

## 2020-01-22 NOTE — Patient Instructions (Addendum)
Medication Instructions:  Your physician has recommended you make the following change in your medication:  1. DECREASE Flecainide to 50 mg twice daily  *If you need a refill on your cardiac medications before your next appointment, please call your pharmacy*   Lab Work: None ordered   Testing/Procedures: None ordered   Follow-Up: At [facility], you and your health needs are our priority.  As part of our continuing mission to provide you with exceptional heart care, we have created designated Provider Care Teams.  These Care Teams include your primary Cardiologist (physician) and Advanced Practice Providers (APPs -  Physician Assistants and Nurse Practitioners) who all work together to provide you with the care you need, when you need it.  Your next appointment:   6 month(s)  The format for your next appointment:   In Person  Provider:   Allegra Lai, MD    Thank you for choosing [facility]!!   Trinidad Curet, RN [phone]   Other Instructions    Right

## 2020-02-08 ENCOUNTER — Other Ambulatory Visit: Payer: Self-pay | Admitting: Neurology

## 2020-02-09 NOTE — Telephone Encounter (Signed)
Rx(s) sent to pharmacy electronically.  

## 2020-02-16 ENCOUNTER — Other Ambulatory Visit: Payer: Self-pay | Admitting: Neurology

## 2020-05-23 ENCOUNTER — Telehealth: Payer: Self-pay

## 2020-05-23 NOTE — Telephone Encounter (Signed)
Dr. Carles Collet- PT, OT, and ST evals were recommended from screens in November and pt wanted to wait until after his sx, which, at times of screens was not yet scheduled. It appears pt has completed sx and f/u appointments from this.   You follow up with Joshua [name] in early June. If you agree, and pt still desires therapy, please order PT, OT, and ST via Epic. Our office will then call him for appointment days/times.  Thank you- Garald Balding, MS, CCC-SLP

## 2020-05-23 NOTE — Telephone Encounter (Signed)
Okay.  I will talk with him about it at follow up.  Thanks.

## 2020-06-13 ENCOUNTER — Other Ambulatory Visit: Payer: Self-pay | Admitting: Neurology

## 2020-06-13 NOTE — Telephone Encounter (Signed)
Has follow up with Dr.Rebecca Tat 08/02/2020

## 2020-06-19 ENCOUNTER — Ambulatory Visit: Payer: Medicare HMO | Admitting: Neurology

## 2020-07-08 ENCOUNTER — Other Ambulatory Visit: Payer: Self-pay | Admitting: Neurology

## 2020-07-11 ENCOUNTER — Other Ambulatory Visit: Payer: Self-pay | Admitting: Neurology

## 2020-08-01 NOTE — Progress Notes (Signed)
Assessment/Plan:   1.  Parkinsons Disease, possibly ET/PD  -Increase carbidopa/levodopa 25/100,2 tablets at 7 AM/2 tablets at 10 AM/2 at 1pm/1 at 4pm/1 at 7pm  -Continue carbidopa/levodopa 50/200 at bedtime  -May repeat levodopa challenge in the future, especially if we consider surgical interventions.  We did this several years ago, but at that point in time he only had tremor (and he has levodopa resistant tremor).  He has since developed more bradykinesia.  In addition, we may retry pramipexole  -he doesn't want to be referred back to PT  -discussed stretch zone  -discussed rsb  -met lcsw today  2.  History of watershed infarct  -Had his linq recorder explanted after it stopped working and never showed any issues  -On Plavix  3.  History of multiple syncopal episodes, likely due to [facility]  -Off of propranolol and no further syncope but still with lightheadedness/fatigue  -continue midodrine 5 mg bid.  No further episodes on this  4.  Low back pain  -Has had surgical interventions in the past.  Has followed with Dr. Brien Few in the past.     Subjective:   Joshua [name] was seen today in follow up for Parkinsons disease.  My previous records were reviewed prior to todays visit as well as outside records available to me.  Wife with patient and supplements the hx. C/o tremor and feeling stiff rigid/stiff in back and neck.  Noting forward flexion of the neck/hips.  Noting flexion of the knees with ambulation.  He has chronic low back issues.  The midodrine has helped the syncope/near syncope but still with fatigue.   Current prescribed movement disorder medications: Carbidopa/levodopa 25/100, 2 tablets at 7 AM/2 tablets at 11 AM/continue 1 tablet at3 PM/1 tablet 7 PM Carbidopa/levodopa 50/200 at bedtime Midodrine 5 mg bid   PREVIOUS MEDICATIONS: Pramipexole (just was not helpful for tremor); primidone; propranolol; metoprolol  ALLERGIES:   Allergies  Allergen Reactions    Floxin I.V. In Dextrose 5% [Ofloxacin] Other (See Comments)    Lowers BP Lowers BP   Terazosin Other (See Comments)    CURRENT MEDICATIONS:  Outpatient Encounter Medications as of 08/05/2020  Medication Sig   acetaminophen (TYLENOL) 500 MG tablet Take 1-2 tablets by mouth as needed.   carbidopa-levodopa (SINEMET CR) 50-200 MG tablet TAKE 1 TABLET BY MOUTH EVERYDAY AT BEDTIME   carbidopa-levodopa (SINEMET IR) 25-100 MG tablet TAKE 2 TABLETS AT 7AM, 2 TABLETS AT 11AM, 1 TABLET AT 3PM AND 1 TABLET AT 7PM   clopidogrel (PLAVIX) 75 MG tablet Take 1 tablet (75 mg total) by mouth daily.   Cyanocobalamin (VITAMIN B 12 PO) Take by mouth.   DYMISTA 137-50 MCG/ACT SUSP Place 1 puff into both nostrils at bedtime.    escitalopram (LEXAPRO) 10 MG tablet Take 10 mg by mouth daily.   finasteride (PROSCAR) 5 MG tablet Take 5 mg by mouth Daily.   flecainide (TAMBOCOR) 50 MG tablet Take 1 tablet (50 mg total) by mouth 2 (two) times daily.   lansoprazole (PREVACID) 30 MG capsule Take 30 mg by mouth 2 (two) times daily.   levocetirizine (XYZAL) 5 MG tablet Take 5 mg by mouth every evening.     midodrine (PROAMATINE) 5 MG tablet TAKE 1 TABLET (5 MG TOTAL) BY MOUTH 2 (TWO) TIMES DAILY WITH A MEAL.   montelukast (SINGULAIR) 10 MG tablet Take 10 mg by mouth at bedtime.     Omega-3 Fatty Acids (FISH OIL) 1000 MG CAPS Take 1 capsule  by mouth daily.   PROCTOSOL HC 2.5 % rectal cream APPLY INTO AND AROUND RECTUM 2 TIMES A DAY   rosuvastatin (CRESTOR) 5 MG tablet Take 5 mg by mouth daily.    tamsulosin (FLOMAX) 0.4 MG CAPS capsule Take 0.4 mg by mouth daily after supper.    traMADol (ULTRAM) 50 MG tablet Take 50 mg by mouth every 6 (six) hours as needed. for pain   zolpidem (AMBIEN) 10 MG tablet Take 10 mg by mouth at bedtime.   No facility-administered encounter medications on file as of 08/05/2020.    Objective:   PHYSICAL EXAMINATION:    VITALS:   Vitals:   08/05/20 1424  BP: 124/78  Pulse: 76  SpO2: 95%   Weight: 227 lb (103 kg)  Height: _0  (1.854 m)     GEN:  The patient appears stated age and is in NAD. HEENT:  Normocephalic, atraumatic.  The mucous membranes are moist. The superficial temporal arteries are without ropiness or tenderness. CV:  RRR Lungs:  CTAB Neck/HEME:  There are no carotid bruits bilaterally.  Neurological examination:  Orientation: The patient is alert and oriented x3. Cranial nerves: There is good facial symmetry with facial hypomimia. The speech is fluent and clear. Soft palate rises symmetrically and there is no tongue deviation. Hearing is intact to conversational tone. Sensation: Sensation is intact to light touch throughout Motor: Strength is at least antigravity x4.  Movement examination: Tone: There is mild to mod increased tone in the [place] (he is almost due for med) Abnormal movements: there is RUE>LUE rest tremor Coordination:  There is decremation, with any form of RAMS, including alternating supination and pronation of the forearm, hand opening and closing, finger taps, heel taps and toe taps, mostly on the right Gait and Station: The patient has no difficulty arising out of a deep-seated chair without the use of the hands. The patient is forward flexed with decreased arm swing  I have reviewed and interpreted the following labs independently    Chemistry      Component Value Date/Time   NA 138 09/18/2015 1135   NA 143 04/04/2015 1501   K 4.4 09/18/2015 1135   CL 105 09/18/2015 1135   CO2 22 09/18/2015 1135   BUN 19 09/18/2015 1135   BUN 19 04/04/2015 1501   CREATININE 0.88 09/18/2015 1135      Component Value Date/Time   CALCIUM 9.4 09/18/2015 1135   ALKPHOS 52 08/25/2015 1140   AST 23 08/25/2015 1140   ALT 22 08/25/2015 1140   BILITOT 1.1 08/25/2015 1140   BILITOT 0.7 04/04/2015 1501       Lab Results  Component Value Date   WBC 8.4 09/18/2015   HGB 14.8 09/18/2015   HCT 42.1 09/18/2015   MCV 89.4 09/18/2015   PLT 200  09/18/2015    Lab Results  Component Value Date   TSH 1.240 04/04/2015     Total time spent on today's visit was 35 minutes, including both face-to-face time and nonface-to-face time.  Time included that spent on review of records (prior notes available to me/labs/imaging if pertinent), discussing treatment and goals, answering patient's questions and coordinating care.  Cc:  Ginger Organ., MD

## 2020-08-02 ENCOUNTER — Ambulatory Visit: Payer: Medicare HMO | Admitting: Neurology

## 2020-08-02 ENCOUNTER — Other Ambulatory Visit: Payer: Self-pay | Admitting: Neurology

## 2020-08-05 ENCOUNTER — Other Ambulatory Visit: Payer: Self-pay

## 2020-08-05 ENCOUNTER — Encounter: Payer: Self-pay | Admitting: Neurology

## 2020-08-05 ENCOUNTER — Ambulatory Visit (INDEPENDENT_AMBULATORY_CARE_PROVIDER_SITE_OTHER): Payer: Medicare HMO | Admitting: Neurology

## 2020-08-05 VITALS — BP 124/78 | HR 76 | Ht 73.0 in | Wt 227.0 lb

## 2020-08-05 DIAGNOSIS — G903 Multi-system degeneration of the autonomic nervous system: Secondary | ICD-10-CM | POA: Diagnosis not present

## 2020-08-05 DIAGNOSIS — G20 Parkinson's disease: Secondary | ICD-10-CM

## 2020-08-05 MED ORDER — CARBIDOPA-LEVODOPA ER 50-200 MG PO TBCR: EXTENDED_RELEASE_TABLET | ORAL | 1 refills | Status: DC

## 2020-08-05 MED ORDER — CARBIDOPA-LEVODOPA 25-100 MG PO TABS: ORAL_TABLET | ORAL | 1 refills | Status: DC

## 2020-08-05 NOTE — Patient Instructions (Signed)
Increase carbidopa/levodopa 25/100,2 tablets at 7 AM/2 tablets at 10 AM/2 at 1pm/1 at 4pm/1 at 7pm Continue carbidopa/levodopa 50/200 at bedtime

## 2020-09-17 ENCOUNTER — Ambulatory Visit: Payer: Medicare HMO | Admitting: Cardiology

## 2020-10-02 NOTE — Progress Notes (Signed)
PCP:  Ginger Organ., MD Primary Cardiologist: Will Meredith Leeds, MD Electrophysiologist: Will Meredith Leeds, MD   Joshua [name] is a 75 y.o. male seen today for Will Meredith Leeds, MD for routine electrophysiology followup.  Since last being seen in our clinic the patient reports doing very well. He has intermittent brief palpitations that he can feel in his throat/neck. More noticeable at night. Not daily. Has occasional left "rib pain" Not exertional. No specific aggravating or relieving factors.   Past Medical History:  Diagnosis Date   Allergic rhinitis    Allergy    Anal fissure    Anxiety    from chronic pain from surgery- on Cymbalta   Arthritis    Asthma    Barrett's esophagus 03/29/2014   Carotid artery disease ([place])    Carotid Doppler normal August, 2007   Cataract    Chest pain    Coronaries normal 1996 /  nuclear..06/2002..normal...EF  56% /  stress echo.. May, 2011.... no  scar or ischemia... rate related RBBB   Diverticulosis    Dyslipidemia    Ejection fraction    EF 60%, stress echo, May, 2011   GERD (gastroesophageal reflux disease)    Headache    History of loop recorder    has since 04/04/15   HTN (hypertension)    takes Metoprolol for PVC control   Hx of colonic polyps    adenomatous   Hx of colonoscopy    Hyperlipidemia    IFG (impaired fasting glucose)    Palpitations    Benign PVCs   Parkinson disease ([place])    Prostate cancer (HCC)    RBBB (right bundle branch block)    rate related   Shingles    Stroke [facility])    TIA   TIA (transient ischemic attack) 02/2015   Per pt, had 2 strokes   Tremor    Hand tremor   Vertigo    Past Surgical History:  Procedure Laterality Date   BACK SURGERY  2002,2009   x 6   CHOLECYSTECTOMY  11/30/2012   with IOC   COLONOSCOPY     ELECTROPHYSIOLOGIC STUDY N/A 09/26/2015   Procedure: V Tach Ablation (PVC);  Surgeon: Will Meredith Leeds, MD;  Location: [place] [facility];  Service:  Cardiovascular;  Laterality: N/A;   EP IMPLANTABLE DEVICE N/A 04/04/2015   Procedure: Loop Recorder Insertion;  Surgeon: Will Meredith Leeds, MD;  Location: [place] [facility];  Service: Cardiovascular;  Laterality: N/A;   HERNIA REPAIR     laprascopic   KNEE SURGERY     DECEMBER 2017,LEFT KNEE SCOPED   NECK SURGERY  2002   POLYPECTOMY     ROTATOR CUFF REPAIR Left    TEE WITHOUT CARDIOVERSION N/A 04/04/2015   Procedure: TRANSESOPHAGEAL ECHOCARDIOGRAM (TEE);  Surgeon: Lelon Perla, MD;  Location: [facility] ENDOSCOPY;  Service: Cardiovascular;  Laterality: N/A;   TRIGGER FINGER RELEASE Left 12/28/2019   Procedure: RELEASE TRIGGER FINGER/A-1 PULLEY THUMB, MIDDLE AND RING;  Surgeon: Daryll Brod, MD;  Location: [place];  Service: Orthopedics;  Laterality: Left;  FAB   UPPER GASTROINTESTINAL ENDOSCOPY     V Tach ablation  09/26/2015    Current Outpatient Medications  Medication Sig Dispense Refill   acetaminophen (TYLENOL) 500 MG tablet Take 1-2 tablets by mouth as needed.     carbidopa-levodopa (SINEMET CR) 50-200 MG tablet TAKE 1 TABLET BY MOUTH EVERYDAY AT BEDTIME 90 tablet 1   carbidopa-levodopa (SINEMET IR) 25-100  MG tablet 2 tablets at 7 AM/2 tablets at 10 AM/2 at 1pm/1 at 4pm/1 at 7pm 720 tablet 1   clopidogrel (PLAVIX) 75 MG tablet Take 1 tablet (75 mg total) by mouth daily. 30 tablet 2   Cyanocobalamin (VITAMIN B 12 PO) Take by mouth.     DYMISTA 137-50 MCG/ACT SUSP Place 1 puff into both nostrils at bedtime.      escitalopram (LEXAPRO) 10 MG tablet Take 10 mg by mouth daily.     finasteride (PROSCAR) 5 MG tablet Take 5 mg by mouth Daily.     flecainide (TAMBOCOR) 50 MG tablet Take 1 tablet (50 mg total) by mouth 2 (two) times daily. 180 tablet 2   lansoprazole (PREVACID) 30 MG capsule Take 30 mg by mouth 2 (two) times daily.     levocetirizine (XYZAL) 5 MG tablet Take 5 mg by mouth every evening.       midodrine (PROAMATINE) 5 MG tablet TAKE 1 TABLET (5 MG TOTAL) BY  MOUTH 2 (TWO) TIMES DAILY WITH A MEAL. 60 tablet 0   montelukast (SINGULAIR) 10 MG tablet Take 10 mg by mouth at bedtime.       Omega-3 Fatty Acids (FISH OIL) 1000 MG CAPS Take 1 capsule by mouth daily.     PROCTOSOL HC 2.5 % rectal cream APPLY INTO AND AROUND RECTUM 2 TIMES A DAY 28.35 g 0   rosuvastatin (CRESTOR) 5 MG tablet Take 5 mg by mouth daily.      tamsulosin (FLOMAX) 0.4 MG CAPS capsule Take 0.4 mg by mouth daily after supper.      traMADol (ULTRAM) 50 MG tablet Take 50 mg by mouth every 6 (six) hours as needed. for pain  0   zolpidem (AMBIEN) 10 MG tablet Take 10 mg by mouth at bedtime.     No current facility-administered medications for this visit.    Allergies  Allergen Reactions   Floxin I.V. In Dextrose 5% [Ofloxacin] Other (See Comments)    Lowers BP Lowers BP   Terazosin Other (See Comments)    Social History   Socioeconomic History   Marital status: Married    Spouse name: [name]    Number of children: 2   Years of education: 15   Highest education level: Some college, no degree  Occupational History   Occupation: Retired    Comment: Dance movement psychotherapist  Tobacco Use   Smoking status: Never   Smokeless tobacco: Never  Scientific laboratory technician Use: Never used  Substance and Sexual Activity   Alcohol use: No    Alcohol/week: 0.0 standard drinks   Drug use: No   Sexual activity: Not Currently  Other Topics Concern   Not on file  Social History Narrative   Lives with wife.    Caffeine use: Coffee/tea/soda- ocassionally   Right-handed   Social Determinants of Health   Financial Resource Strain: Not on file  Food Insecurity: Not on file  Transportation Needs: Not on file  Physical Activity: Not on file  Stress: Not on file  Social Connections: Not on file  Intimate Partner Violence: Not on file     Review of Systems: All other systems reviewed and are otherwise negative except as noted above.  Physical Exam: Vitals:   10/04/20 1036  BP: 112/68   Pulse: 88  SpO2: 98%  Weight: 226 lb 3.2 oz (102.6 kg)  Height: 6\' 1"  (1.854 m)    GEN- The patient is well appearing, alert and oriented x 3 today.  HEENT: normocephalic, atraumatic; sclera clear, conjunctiva pink; hearing intact; oropharynx clear; neck supple, no JVP Lymph- no cervical lymphadenopathy Lungs- Clear to ausculation bilaterally, normal work of breathing.  No wheezes, rales, rhonchi Heart- Regular rate and rhythm, no murmurs, rubs or gallops, PMI not laterally displaced GI- soft, non-tender, non-distended, bowel sounds present, no hepatosplenomegaly Extremities- no clubbing, cyanosis, or edema; DP/PT/radial pulses 2+ bilaterally MS- no significant deformity or atrophy Skin- warm and dry, no rash or lesion Psych- euthymic mood, full affect Neuro- strength and sensation are intact  EKG is ordered. Personal review of EKG from today shows NSR with 1st degree AV block and stable RBBB at 88 bpm  Additional studies reviewed include: Previous EP office notes.   Assessment and Plan:  1. PVC s/p ablation with intermittent suppresion Flecainide has helped with burden.  Previously decreased to 50 mg BID with RBBB Would try to avoid BB if possible given RBBB and 1st degree AV block at baseline.   2. HTN Stable on current regimen   3. Cryptogenic Stroke He had an ILR that was at EOL. PAF was NOT identified. It has been explanted.  Infrequent palpitations. Re-assurance given. Labs today. RTC 6 months, sooner with worsening symptoms.   Shirley Friar, PA-C  10/04/20 10:49 AM

## 2020-10-04 ENCOUNTER — Ambulatory Visit (INDEPENDENT_AMBULATORY_CARE_PROVIDER_SITE_OTHER): Payer: Medicare HMO | Admitting: Student

## 2020-10-04 ENCOUNTER — Other Ambulatory Visit: Payer: Self-pay

## 2020-10-04 ENCOUNTER — Encounter: Payer: Self-pay | Admitting: Student

## 2020-10-04 VITALS — BP 112/68 | HR 88 | Ht 73.0 in | Wt 226.2 lb

## 2020-10-04 DIAGNOSIS — I493 Ventricular premature depolarization: Secondary | ICD-10-CM | POA: Diagnosis not present

## 2020-10-04 LAB — BASIC METABOLIC PANEL
BUN/Creatinine Ratio: 17 (ref 10–24)
BUN: 15 mg/dL (ref 8–27)
CO2: 20 mmol/L (ref 20–29)
Calcium: 9.6 mg/dL (ref 8.6–10.2)
Chloride: 100 mmol/L (ref 96–106)
Creatinine, Ser: 0.87 mg/dL (ref 0.76–1.27)
Glucose: 112 mg/dL — ABNORMAL HIGH (ref 65–99)
Potassium: 4 mmol/L (ref 3.5–5.2)
Sodium: 135 mmol/L (ref 134–144)
eGFR: 91 mL/min/{1.73_m2} (ref >59)

## 2020-10-04 LAB — CBC
Hematocrit: 41.9 % (ref 37.5–51.0)
Hemoglobin: 14.4 g/dL (ref 13.0–17.7)
MCH: 31.8 pg (ref 26.6–33.0)
MCHC: 34.4 g/dL (ref 31.5–35.7)
MCV: 93 fL (ref 79–97)
Platelets: 208 10*3/uL (ref 150–450)
RBC: 4.53 x10E6/uL (ref 4.14–5.80)
RDW: 12.7 % (ref 11.6–15.4)
WBC: 7.3 10*3/uL (ref 3.4–10.8)

## 2020-10-04 NOTE — Patient Instructions (Signed)
Medication Instructions:  Your physician recommends that you continue on your current medications as directed. Please refer to the Current Medication list given to you today.  *If you need a refill on your cardiac medications before your next appointment, please call your pharmacy*   Lab Work: TODAY: BMET, CBC  If you have labs (blood work) drawn today and your tests are completely normal, you will receive your results only by: [place] (if you have MyChart) OR A paper copy in the mail If you have any lab test that is abnormal or we need to change your treatment, we will call you to review the results.   Follow-Up: At [facility], you and your health needs are our priority.  As part of our continuing mission to provide you with exceptional heart care, we have created designated Provider Care Teams.  These Care Teams include your primary Cardiologist (physician) and Advanced Practice Providers (APPs -  Physician Assistants and Nurse Practitioners) who all work together to provide you with the care you need, when you need it.   Your next appointment:   6 month(s)  The format for your next appointment:   In Person  Provider:   You may see Dr Curt Bears or one of the following Advanced Practice Providers on your designated Care Team:   Tommye Standard, [place] Legrand Como "[facility]" [place], [place]

## 2020-11-12 ENCOUNTER — Other Ambulatory Visit: Payer: Self-pay | Admitting: Neurology

## 2020-11-12 DIAGNOSIS — G20 Parkinson's disease: Secondary | ICD-10-CM

## 2020-11-25 ENCOUNTER — Encounter: Payer: Self-pay | Admitting: Internal Medicine

## 2021-01-19 ENCOUNTER — Other Ambulatory Visit: Payer: Self-pay | Admitting: Neurology

## 2021-01-19 DIAGNOSIS — G20 Parkinson's disease: Secondary | ICD-10-CM

## 2021-01-20 ENCOUNTER — Other Ambulatory Visit: Payer: Self-pay

## 2021-01-24 ENCOUNTER — Ambulatory Visit: Payer: Medicare HMO | Admitting: Neurology

## 2021-02-05 ENCOUNTER — Encounter (HOSPITAL_COMMUNITY)
Admission: EM | Disposition: A | Discharge status: Home / Self Care | Payer: Self-pay | Source: Home / Self Care | Attending: Interventional Cardiology

## 2021-02-05 ENCOUNTER — Other Ambulatory Visit: Payer: Self-pay

## 2021-02-05 ENCOUNTER — Emergency Department (HOSPITAL_COMMUNITY): Payer: Medicare HMO

## 2021-02-05 ENCOUNTER — Inpatient Hospital Stay (HOSPITAL_COMMUNITY)
Admission: EM | Admit: 2021-02-05 | Discharge: 2021-02-07 | DRG: 280 | Disposition: A | Discharge status: Home / Self Care | Payer: Medicare HMO | Attending: Interventional Cardiology | Admitting: Interventional Cardiology

## 2021-02-05 ENCOUNTER — Encounter (HOSPITAL_COMMUNITY): Payer: Self-pay

## 2021-02-05 DIAGNOSIS — I214 Non-ST elevation (NSTEMI) myocardial infarction: Secondary | ICD-10-CM | POA: Diagnosis not present

## 2021-02-05 DIAGNOSIS — E785 Hyperlipidemia, unspecified: Secondary | ICD-10-CM | POA: Diagnosis not present

## 2021-02-05 DIAGNOSIS — K219 Gastro-esophageal reflux disease without esophagitis: Secondary | ICD-10-CM | POA: Diagnosis present

## 2021-02-05 DIAGNOSIS — Z888 Allergy status to other drugs, medicaments and biological substances status: Secondary | ICD-10-CM

## 2021-02-05 DIAGNOSIS — G250 Essential tremor: Secondary | ICD-10-CM | POA: Diagnosis present

## 2021-02-05 DIAGNOSIS — I5021 Acute systolic (congestive) heart failure: Secondary | ICD-10-CM | POA: Diagnosis present

## 2021-02-05 DIAGNOSIS — G20 Parkinson's disease: Secondary | ICD-10-CM | POA: Diagnosis not present

## 2021-02-05 DIAGNOSIS — I639 Cerebral infarction, unspecified: Secondary | ICD-10-CM

## 2021-02-05 DIAGNOSIS — J309 Allergic rhinitis, unspecified: Secondary | ICD-10-CM | POA: Diagnosis present

## 2021-02-05 DIAGNOSIS — Z8673 Personal history of transient ischemic attack (TIA), and cerebral infarction without residual deficits: Secondary | ICD-10-CM

## 2021-02-05 DIAGNOSIS — I4510 Unspecified right bundle-branch block: Secondary | ICD-10-CM | POA: Diagnosis present

## 2021-02-05 DIAGNOSIS — Z8249 Family history of ischemic heart disease and other diseases of the circulatory system: Secondary | ICD-10-CM

## 2021-02-05 DIAGNOSIS — Z79899 Other long term (current) drug therapy: Secondary | ICD-10-CM

## 2021-02-05 DIAGNOSIS — I2510 Atherosclerotic heart disease of native coronary artery without angina pectoris: Secondary | ICD-10-CM | POA: Diagnosis present

## 2021-02-05 DIAGNOSIS — I493 Ventricular premature depolarization: Secondary | ICD-10-CM | POA: Diagnosis present

## 2021-02-05 DIAGNOSIS — I10 Essential (primary) hypertension: Secondary | ICD-10-CM | POA: Diagnosis not present

## 2021-02-05 DIAGNOSIS — I252 Old myocardial infarction: Secondary | ICD-10-CM | POA: Diagnosis present

## 2021-02-05 DIAGNOSIS — Z20822 Contact with and (suspected) exposure to covid-19: Secondary | ICD-10-CM | POA: Diagnosis present

## 2021-02-05 DIAGNOSIS — Q2112 Patent foramen ovale: Secondary | ICD-10-CM

## 2021-02-05 DIAGNOSIS — G4733 Obstructive sleep apnea (adult) (pediatric): Secondary | ICD-10-CM | POA: Diagnosis present

## 2021-02-05 DIAGNOSIS — I110 Hypertensive heart disease with heart failure: Secondary | ICD-10-CM | POA: Diagnosis present

## 2021-02-05 DIAGNOSIS — Z7902 Long term (current) use of antithrombotics/antiplatelets: Secondary | ICD-10-CM

## 2021-02-05 HISTORY — PX: LEFT HEART CATH AND CORONARY ANGIOGRAPHY: CATH118249

## 2021-02-05 LAB — COMPREHENSIVE METABOLIC PANEL
ALT: 11 U/L (ref 0–44)
AST: 23 U/L (ref 15–41)
Albumin: 4.1 g/dL (ref 3.5–5.0)
Alkaline Phosphatase: 58 U/L (ref 38–126)
Anion gap: 8 (ref 5–15)
BUN: 18 mg/dL (ref 8–23)
CO2: 22 mmol/L (ref 22–32)
Calcium: 9.1 mg/dL (ref 8.9–10.3)
Chloride: 103 mmol/L (ref 98–111)
Creatinine, Ser: 0.99 mg/dL (ref 0.61–1.24)
GFR, Estimated: >60 mL/min (ref >60)
Glucose, Bld: 125 mg/dL — ABNORMAL HIGH (ref 70–99)
Potassium: 3.7 mmol/L (ref 3.5–5.1)
Sodium: 133 mmol/L — ABNORMAL LOW (ref 135–145)
Total Bilirubin: 1 mg/dL (ref 0.3–1.2)
Total Protein: 7 g/dL (ref 6.5–8.1)

## 2021-02-05 LAB — CBC WITH DIFFERENTIAL/PLATELET
Abs Immature Granulocytes: 0.06 10*3/uL (ref 0.00–0.07)
Basophils Absolute: 0 10*3/uL (ref 0.0–0.1)
Basophils Relative: 0 %
Eosinophils Absolute: 0 10*3/uL (ref 0.0–0.5)
Eosinophils Relative: 0 %
HCT: 45.4 % (ref 39.0–52.0)
Hemoglobin: 15.5 g/dL (ref 13.0–17.0)
Immature Granulocytes: 1 %
Lymphocytes Relative: 23 %
Lymphs Abs: 2.4 10*3/uL (ref 0.7–4.0)
MCH: 32.4 pg (ref 26.0–34.0)
MCHC: 34.1 g/dL (ref 30.0–36.0)
MCV: 94.8 fL (ref 80.0–100.0)
Monocytes Absolute: 1.3 10*3/uL — ABNORMAL HIGH (ref 0.1–1.0)
Monocytes Relative: 13 %
Neutro Abs: 6.6 10*3/uL (ref 1.7–7.7)
Neutrophils Relative %: 63 %
Platelets: 232 10*3/uL (ref 150–400)
RBC: 4.79 MIL/uL (ref 4.22–5.81)
RDW: 12.7 % (ref 11.5–15.5)
WBC: 10.4 10*3/uL (ref 4.0–10.5)
nRBC: 0 % (ref 0.0–0.2)

## 2021-02-05 LAB — RESP PANEL BY RT-PCR (FLU A&B, COVID) ARPGX2
Influenza A by PCR: NEGATIVE
Influenza B by PCR: NEGATIVE
SARS Coronavirus 2 by RT PCR: NEGATIVE

## 2021-02-05 LAB — TROPONIN I (HIGH SENSITIVITY)
Troponin I (High Sensitivity): 287 ng/L (ref <18)
Troponin I (High Sensitivity): 11750 ng/L (ref <18)

## 2021-02-05 LAB — PROTIME-INR
INR: 1 (ref 0.8–1.2)
Prothrombin Time: 13.1 seconds (ref 11.4–15.2)

## 2021-02-05 LAB — LIPASE, BLOOD: Lipase: 26 U/L (ref 11–51)

## 2021-02-05 SURGERY — LEFT HEART CATH AND CORONARY ANGIOGRAPHY
Anesthesia: LOCAL

## 2021-02-05 MED ORDER — ONDANSETRON HCL 4 MG/2ML IJ SOLN: 4.0000 mg | Freq: Four times a day (QID) | INTRAMUSCULAR | Status: DC | PRN

## 2021-02-05 MED ORDER — HEPARIN (PORCINE) IN NACL 1000-0.9 UT/500ML-% IV SOLN
INTRAVENOUS | Status: DC | PRN
  Administered 2021-02-05 (×2): 500 mL

## 2021-02-05 MED ORDER — NITROGLYCERIN IN D5W 200-5 MCG/ML-% IV SOLN
0.0000 ug/min | INTRAVENOUS | Status: DC
  Administered 2021-02-05: 16:00:00 5 ug/min via INTRAVENOUS
  Filled 2021-02-05: qty 250

## 2021-02-05 MED ORDER — SODIUM CHLORIDE 0.9% FLUSH: 3.0000 mL | INTRAVENOUS | Status: DC | PRN

## 2021-02-05 MED ORDER — ASPIRIN 81 MG PO CHEW
324.0000 mg | CHEWABLE_TABLET | Freq: Once | ORAL | Status: AC
  Administered 2021-02-05: 13:00:00 324 mg via ORAL
  Filled 2021-02-05: qty 4

## 2021-02-05 MED ORDER — HEPARIN BOLUS VIA INFUSION
4000.0000 [IU] | Freq: Once | INTRAVENOUS | Status: AC
  Administered 2021-02-05: 16:00:00 4000 [IU] via INTRAVENOUS
  Filled 2021-02-05: qty 4000

## 2021-02-05 MED ORDER — ZOLPIDEM TARTRATE 5 MG PO TABS
5.0000 mg | ORAL_TABLET | Freq: Every day | ORAL | Status: DC
  Administered 2021-02-05 – 2021-02-06 (×2): 5 mg via ORAL
  Filled 2021-02-05 (×2): qty 1

## 2021-02-05 MED ORDER — SODIUM CHLORIDE 0.9% FLUSH: 3.0000 mL | Freq: Two times a day (BID) | INTRAVENOUS | Status: DC

## 2021-02-05 MED ORDER — IOHEXOL 350 MG/ML SOLN
INTRAVENOUS | Status: DC | PRN
  Administered 2021-02-05: 18:00:00 100 mL

## 2021-02-05 MED ORDER — TAMSULOSIN HCL 0.4 MG PO CAPS
0.4000 mg | ORAL_CAPSULE | Freq: Every day | ORAL | Status: DC
  Administered 2021-02-05 – 2021-02-06 (×2): 0.4 mg via ORAL
  Filled 2021-02-05 (×2): qty 1

## 2021-02-05 MED ORDER — SODIUM CHLORIDE 0.9% FLUSH
3.0000 mL | Freq: Two times a day (BID) | INTRAVENOUS | Status: DC
  Administered 2021-02-05 – 2021-02-07 (×2): 3 mL via INTRAVENOUS

## 2021-02-05 MED ORDER — HEPARIN (PORCINE) 25000 UT/250ML-% IV SOLN
1300.0000 [IU]/h | INTRAVENOUS | Status: DC
  Administered 2021-02-05 – 2021-02-06 (×2): 1300 [IU]/h via INTRAVENOUS
  Filled 2021-02-05 (×2): qty 250

## 2021-02-05 MED ORDER — TRAMADOL HCL 50 MG PO TABS
50.0000 mg | ORAL_TABLET | Freq: Four times a day (QID) | ORAL | Status: DC | PRN
  Administered 2021-02-05 – 2021-02-06 (×2): 50 mg via ORAL
  Filled 2021-02-05 (×2): qty 1

## 2021-02-05 MED ORDER — FLUTICASONE PROPIONATE 50 MCG/ACT NA SUSP
1.0000 | Freq: Every day | NASAL | Status: DC
  Administered 2021-02-05 – 2021-02-06 (×2): 1 via NASAL
  Filled 2021-02-05: qty 16

## 2021-02-05 MED ORDER — FINASTERIDE 5 MG PO TABS
5.0000 mg | ORAL_TABLET | Freq: Every day | ORAL | Status: DC
  Administered 2021-02-05 – 2021-02-07 (×3): 5 mg via ORAL
  Filled 2021-02-05 (×3): qty 1

## 2021-02-05 MED ORDER — FENTANYL CITRATE (PF) 100 MCG/2ML IJ SOLN
INTRAMUSCULAR | Status: DC | PRN
  Administered 2021-02-05: 25 ug via INTRAVENOUS

## 2021-02-05 MED ORDER — AZELASTINE HCL 0.1 % NA SOLN
1.0000 | Freq: Every day | NASAL | Status: DC
  Administered 2021-02-05 – 2021-02-06 (×2): 1 via NASAL
  Filled 2021-02-05: qty 30

## 2021-02-05 MED ORDER — HEPARIN (PORCINE) 25000 UT/250ML-% IV SOLN
1400.0000 [IU]/h | INTRAVENOUS | Status: DC
  Administered 2021-02-05: 16:00:00 1400 [IU]/h via INTRAVENOUS
  Filled 2021-02-05: qty 250

## 2021-02-05 MED ORDER — AZELASTINE-FLUTICASONE 137-50 MCG/ACT NA SUSP
1.0000 | Freq: Every day | NASAL | Status: DC
Start: 2021-02-05 — End: 2021-02-05

## 2021-02-05 MED ORDER — CARBIDOPA-LEVODOPA 25-100 MG PO TABS
2.0000 | ORAL_TABLET | Freq: Three times a day (TID) | ORAL | Status: DC
Start: 2021-02-06 — End: 2021-02-07
  Administered 2021-02-06 – 2021-02-07 (×4): 2 via ORAL
  Filled 2021-02-05 (×6): qty 2

## 2021-02-05 MED ORDER — HEPARIN SODIUM (PORCINE) 1000 UNIT/ML IJ SOLN
INTRAMUSCULAR | Status: AC
  Filled 2021-02-05: qty 10

## 2021-02-05 MED ORDER — ASPIRIN 81 MG PO CHEW: 81.0000 mg | CHEWABLE_TABLET | ORAL | Status: DC

## 2021-02-05 MED ORDER — LIDOCAINE HCL (PF) 1 % IJ SOLN
INTRAMUSCULAR | Status: AC
  Filled 2021-02-05: qty 30

## 2021-02-05 MED ORDER — SODIUM CHLORIDE 0.9 % IV SOLN: 250.0000 mL | INTRAVENOUS | Status: DC | PRN

## 2021-02-05 MED ORDER — MIDAZOLAM HCL 2 MG/2ML IJ SOLN
INTRAMUSCULAR | Status: AC
  Filled 2021-02-05: qty 2

## 2021-02-05 MED ORDER — CARBIDOPA-LEVODOPA ER 50-200 MG PO TBCR
1.0000 | EXTENDED_RELEASE_TABLET | Freq: Every day | ORAL | Status: DC
  Administered 2021-02-05 – 2021-02-06 (×2): 1 via ORAL
  Filled 2021-02-05 (×3): qty 1

## 2021-02-05 MED ORDER — CLOPIDOGREL BISULFATE 75 MG PO TABS
75.0000 mg | ORAL_TABLET | Freq: Every day | ORAL | Status: DC
  Administered 2021-02-06 – 2021-02-07 (×2): 75 mg via ORAL
  Filled 2021-02-05 (×2): qty 1

## 2021-02-05 MED ORDER — CARBIDOPA-LEVODOPA 25-100 MG PO TABS
1.0000 | ORAL_TABLET | Freq: Two times a day (BID) | ORAL | Status: DC
Start: 2021-02-05 — End: 2021-02-07
  Administered 2021-02-05 – 2021-02-06 (×3): 1 via ORAL
  Filled 2021-02-05 (×5): qty 1

## 2021-02-05 MED ORDER — FENTANYL CITRATE (PF) 100 MCG/2ML IJ SOLN
INTRAMUSCULAR | Status: AC
  Filled 2021-02-05: qty 2

## 2021-02-05 MED ORDER — ASPIRIN EC 81 MG PO TBEC
81.0000 mg | DELAYED_RELEASE_TABLET | Freq: Every day | ORAL | Status: DC
  Administered 2021-02-06 – 2021-02-07 (×2): 81 mg via ORAL
  Filled 2021-02-05 (×2): qty 1

## 2021-02-05 MED ORDER — SODIUM CHLORIDE 0.9 % IV SOLN: INTRAVENOUS | Status: AC

## 2021-02-05 MED ORDER — SODIUM CHLORIDE 0.9 % WEIGHT BASED INFUSION: 3.0000 mL/kg/h | INTRAVENOUS | Status: DC

## 2021-02-05 MED ORDER — PANTOPRAZOLE SODIUM 40 MG PO TBEC
40.0000 mg | DELAYED_RELEASE_TABLET | Freq: Every day | ORAL | Status: DC
  Administered 2021-02-06 – 2021-02-07 (×2): 40 mg via ORAL
  Filled 2021-02-05 (×2): qty 1

## 2021-02-05 MED ORDER — VERAPAMIL HCL 2.5 MG/ML IV SOLN
INTRAVENOUS | Status: DC | PRN
  Administered 2021-02-05: 17:00:00 10 mL via INTRA_ARTERIAL

## 2021-02-05 MED ORDER — ROSUVASTATIN CALCIUM 5 MG PO TABS
5.0000 mg | ORAL_TABLET | Freq: Every day | ORAL | Status: DC
  Administered 2021-02-05: 22:00:00 5 mg via ORAL
  Filled 2021-02-05: qty 1

## 2021-02-05 MED ORDER — ESCITALOPRAM OXALATE 10 MG PO TABS
10.0000 mg | ORAL_TABLET | Freq: Every day | ORAL | Status: DC
  Administered 2021-02-06 – 2021-02-07 (×2): 10 mg via ORAL
  Filled 2021-02-05 (×2): qty 1

## 2021-02-05 MED ORDER — LORATADINE 10 MG PO TABS
5.0000 mg | ORAL_TABLET | Freq: Every evening | ORAL | Status: DC
  Administered 2021-02-05 – 2021-02-06 (×2): 5 mg via ORAL
  Filled 2021-02-05 (×2): qty 1

## 2021-02-05 MED ORDER — MONTELUKAST SODIUM 10 MG PO TABS
10.0000 mg | ORAL_TABLET | Freq: Every day | ORAL | Status: DC
Start: 2021-02-05 — End: 2021-02-07
  Administered 2021-02-05 – 2021-02-06 (×2): 10 mg via ORAL
  Filled 2021-02-05 (×2): qty 1

## 2021-02-05 MED ORDER — MIDAZOLAM HCL 2 MG/2ML IJ SOLN
INTRAMUSCULAR | Status: DC | PRN
  Administered 2021-02-05: 1 mg via INTRAVENOUS

## 2021-02-05 MED ORDER — ACETAMINOPHEN 325 MG PO TABS
650.0000 mg | ORAL_TABLET | ORAL | Status: DC | PRN
  Administered 2021-02-05 – 2021-02-06 (×3): 650 mg via ORAL
  Filled 2021-02-05 (×3): qty 2

## 2021-02-05 MED ORDER — VERAPAMIL HCL 2.5 MG/ML IV SOLN
INTRAVENOUS | Status: AC
  Filled 2021-02-05: qty 2

## 2021-02-05 MED ORDER — IOHEXOL 350 MG/ML SOLN
100.0000 mL | Freq: Once | INTRAVENOUS | Status: AC | PRN
  Administered 2021-02-05: 15:00:00 100 mL via INTRAVENOUS

## 2021-02-05 MED ORDER — HEPARIN SODIUM (PORCINE) 1000 UNIT/ML IJ SOLN
INTRAMUSCULAR | Status: DC | PRN
  Administered 2021-02-05: 5000 [IU] via INTRAVENOUS

## 2021-02-05 MED ORDER — NITROGLYCERIN 0.4 MG SL SUBL: 0.4000 mg | SUBLINGUAL_TABLET | SUBLINGUAL | Status: DC | PRN

## 2021-02-05 MED ORDER — SODIUM CHLORIDE 0.9 % WEIGHT BASED INFUSION: 1.0000 mL/kg/h | INTRAVENOUS | Status: DC

## 2021-02-05 MED ORDER — HEPARIN (PORCINE) IN NACL 1000-0.9 UT/500ML-% IV SOLN
INTRAVENOUS | Status: AC
  Filled 2021-02-05: qty 1000

## 2021-02-05 MED ORDER — MIDODRINE HCL 5 MG PO TABS
5.0000 mg | ORAL_TABLET | Freq: Two times a day (BID) | ORAL | Status: DC
  Administered 2021-02-05 – 2021-02-06 (×2): 5 mg via ORAL
  Filled 2021-02-05 (×2): qty 1

## 2021-02-05 MED ORDER — LIDOCAINE HCL (PF) 1 % IJ SOLN
INTRAMUSCULAR | Status: DC | PRN
  Administered 2021-02-05: 2 mL via INTRADERMAL

## 2021-02-05 SURGICAL SUPPLY — 13 items
CATH INFINITI 5FR JK (CATHETERS) ×1 IMPLANT
CATH LAUNCHER 6FR EBU3.5 (CATHETERS) ×1 IMPLANT
DEVICE RAD COMP TR BAND LRG (VASCULAR PRODUCTS) ×1 IMPLANT
ELECT DEFIB PAD ADLT CADENCE (PAD) ×1 IMPLANT
GLIDESHEATH SLEND SS 6F .021 (SHEATH) ×1 IMPLANT
GUIDEWIRE INQWIRE 1.5J.035X260 (WIRE) IMPLANT
INQWIRE 1.5J .035X260CM (WIRE) ×2
KIT ENCORE 26 ADVANTAGE (KITS) ×1 IMPLANT
KIT HEART LEFT (KITS) ×2 IMPLANT
PACK CARDIAC CATHETERIZATION (CUSTOM PROCEDURE TRAY) ×2 IMPLANT
SHEATH PROBE COVER 6X72 (BAG) ×1 IMPLANT
TRANSDUCER W/STOPCOCK (MISCELLANEOUS) ×2 IMPLANT
TUBING CIL FLEX 10 FLL-RA (TUBING) ×2 IMPLANT

## 2021-02-05 NOTE — ED Triage Notes (Signed)
Pt bib ems from home c/o chest pain. Pt stated pain started this morning. Pain is located midsternum that radiates to back and bilaterally to arms. The pain feels like pressure and sharp tinges that travels to both hands. Pt received 1 nitroglycerin route that gave some relief. Pt placed on 2L per protocol 98%.  BP 190/120 initial BP 150/70 HR 90 CBG 155 RR 18

## 2021-02-05 NOTE — ED Notes (Signed)
Assisted pt to the restroom. Pt maintained a steady gait.

## 2021-02-05 NOTE — ED Provider Notes (Signed)
[place] EMERGENCY DEPARTMENT Provider Note    CSN: [id number] Arrival date & time: 02/05/21 1125  History Chief Complaint  Patient presents with   Chest Pain    Joshua [name] is a 76 y.o. male with history of parkinson's and prior stroke, but no prior CAD reports he was using the restroom this morning to urinate and when he was done began to feel a tingling in both hands that progressed to midsternal chest pressure radiating into his back. He was given 1NTG via EMS with some improvement. No ASA given, patient is on Plavix from prior stroke.    Home Medications Prior to Admission medications   Medication Sig Start Date End Date Taking? Authorizing Provider  acetaminophen (TYLENOL) 500 MG tablet Take 1,000 mg by mouth every 8 (eight) hours as needed for mild pain.   Yes [provider]  Biotin 10000 MCG TABS Take 1,000 mcg by mouth daily.   Yes [provider]  carbidopa-levodopa (SINEMET CR) 50-200 MG tablet TAKE 1 TABLET BY MOUTH EVERYDAY AT BEDTIME Patient taking differently: 1 tablet at bedtime. 11/12/20  Yes Tat, Eustace Quail, DO  carbidopa-levodopa (SINEMET IR) 25-100 MG tablet 2 tablets at 7 AM/2 tablets at 10 AM/2 at 1pm/1 at 4pm/1 at 7pm 08/05/20  Yes Tat, Eustace Quail, DO  cholecalciferol (VITAMIN D3) 25 MCG (1000 UNIT) tablet Take 1,000 Units by mouth daily.   Yes [provider]  clopidogrel (PLAVIX) 75 MG tablet Take 1 tablet (75 mg total) by mouth daily. 03/07/15  Yes Oswald Hillock, MD  Cyanocobalamin (VITAMIN B 12 PO) Take 1,000 mcg by mouth daily.   Yes [provider]  DYMISTA 137-50 MCG/ACT SUSP Place 1 puff into both nostrils at bedtime.  03/24/11  Yes [provider]  escitalopram (LEXAPRO) 10 MG tablet Take 10 mg by mouth daily. 07/21/18  Yes [provider]  finasteride (PROSCAR) 5 MG tablet Take 5 mg by mouth Daily. 04/17/11  Yes [provider]  flecainide (TAMBOCOR) 150 MG tablet Take 75 mg by mouth 2  (two) times daily. 12/28/20  Yes [provider]  lansoprazole (PREVACID) 30 MG capsule Take 30 mg by mouth 2 (two) times daily. 11/12/19  Yes [provider]  levocetirizine (XYZAL) 5 MG tablet Take 5 mg by mouth every evening.     Yes [provider]  midodrine (PROAMATINE) 5 MG tablet TAKE 1 TABLET BY MOUTH TWICE A DAY WITH A MEAL Patient taking differently: Take 5 mg by mouth 2 (two) times daily with a meal. 01/20/21  Yes Tat, Wells Guiles S, DO  montelukast (SINGULAIR) 10 MG tablet Take 10 mg by mouth at bedtime.     Yes [provider]  Omega-3 Fatty Acids (FISH OIL) 1000 MG CAPS Take 1,000 mg by mouth daily.   Yes [provider]  PROCTOSOL HC 2.5 % rectal cream APPLY INTO AND AROUND RECTUM 2 TIMES A DAY Patient taking differently: Place 1 application rectally 2 (two) times daily as needed for hemorrhoids or anal itching. 02/12/17  Yes Irene Shipper, MD  rosuvastatin (CRESTOR) 10 MG tablet Take 5 mg by mouth at bedtime. 02/01/21  Yes [provider]  tamsulosin (FLOMAX) 0.4 MG CAPS capsule Take 0.4 mg by mouth daily after supper.  05/25/13  Yes [provider]  traMADol (ULTRAM) 50 MG tablet Take 50 mg by mouth every 6 (six) hours as needed. for pain 09/28/17  Yes [provider]  zolpidem (AMBIEN) 10 MG tablet  Take 10 mg by mouth at bedtime. 05/13/11  Yes [provider]     Allergies    Floxin i.v. in dextrose 5% [ofloxacin] and Terazosin   Review of Systems   Review of Systems Please see HPI for pertinent positives and negatives  Physical Exam BP (!) 163/96    Pulse 67    Temp 98.4 F (36.9 C) (Oral)    Resp 17    Ht 6\' 1"  (1.854 m)    Wt 102.5 kg    SpO2 96%    BMI 29.82 kg/m   Physical Exam Vitals and nursing note reviewed.  Constitutional:      Appearance: Normal appearance.  HENT:     Head: Normocephalic and atraumatic.     Nose: Nose normal.     Mouth/Throat:     Mouth: Mucous membranes are moist.   Eyes:     Extraocular Movements: Extraocular movements intact.     Conjunctiva/sclera: Conjunctivae normal.  Cardiovascular:     Rate and Rhythm: Normal rate.  Pulmonary:     Effort: Pulmonary effort is normal.     Breath sounds: Normal breath sounds.  Chest:     Chest wall: Tenderness (midsternal) present.  Abdominal:     General: Abdomen is flat.     Palpations: Abdomen is soft.     Tenderness: There is no abdominal tenderness.  Musculoskeletal:        General: No swelling. Normal range of motion.     Cervical back: Neck supple.  Skin:    General: Skin is warm and dry.  Neurological:     General: No focal deficit present.     Mental Status: He is alert.     Comments: Resting tremor in UE R>L  Psychiatric:        Mood and Affect: Mood normal.    ED Results / Procedures / Treatments   EKG EKG Interpretation  Date/Time:  Wednesday February 05 2021 11:37:45 EST Ventricular Rate:  69 PR Interval:    QRS Duration: 168 QT Interval:  490 QTC Calculation: 525 R Axis:   16 Text Interpretation: Muscle tremor Sinus rhythm Right bundle branch block accounting for tremor artifact, no significant change from previous Confirmed by Calvert Cantor [phone]) on 02/05/2021 11:46:58 AM  Procedures .Critical Care Performed by: Truddie Hidden, MD Authorized by: Truddie Hidden, MD   Critical care provider statement:    Critical care time (minutes):  30   Critical care was necessary to treat or prevent imminent or life-threatening deterioration of the following conditions:  Cardiac failure   Critical care was time spent personally by me on the following activities:  Development of treatment plan with patient or surrogate, discussions with consultants, evaluation of patient's response to treatment, examination of patient, ordering and review of laboratory studies, ordering and review of radiographic studies, ordering and performing treatments and interventions, pulse oximetry,  re-evaluation of patient's condition and review of old charts   Care discussed with: admitting provider    Medications Ordered in the ED Medications  nitroGLYCERIN (NITROSTAT) SL tablet 0.4 mg (has no administration in time range)  nitroGLYCERIN 50 mg in dextrose 5 % 250 mL (0.2 mg/mL) infusion (has no administration in time range)  heparin bolus via infusion 4,000 Units (has no administration in time range)  heparin ADULT infusion 100 units/mL (25000 units/232mL) (has no administration in time range)  aspirin chewable tablet 324 mg (324 mg Oral Given 02/05/21 1250)  iohexol (OMNIPAQUE) 350 MG/ML injection  100 mL (100 mLs Intravenous Contrast Given 02/05/21 1511)    Initial Impression and Plan Patient with chest pain radiating into his back. He does not have known CAD but has had CVA/TIA in the past. Already on Plavix. He had some relief with one NTG given prior to arrival. Will give ASA, additional NTG. Check EKG, CXR and labs. Currently NSR on monitor.   ED Course   Clinical Course as of 02/05/21 1606  Wed Feb 05, 2021  1223 CXR is clear. CBC is normal.  [CS]  1327 CMP and lipase neg. Initial trop is elevated, given his pain radiating into his back with neg belly labs, will send for CTA to rule out PE/Dissection as a cause.  [CS]  7017 Patient reports pain resolved after ASA. Informed him and family at bedside of current plan and they are in agreement.  [CS]  7939 CTA images reviewed, no PE or dissection by my interpretation, awaiting formal Rads read.  [CS]  0300 CTA is neg for dissection, but does show coronary artery calcifications. Repeat Trop is markedly increased. Will begin Heparin and discuss with Cardiology.  [CS]  9233 Patient reports pain has returned, will recheck EKG, begin NTG for chest pain, BP is elevated which NTG may help too.  [CS]    Clinical Course User Index [CS] Truddie Hidden, MD     MDM Rules/Calculators/A&P Medical Decision Making Problems  Addressed: NSTEMI (non-ST elevated myocardial infarction) [facility]): acute illness or injury that poses a threat to life or bodily functions  Amount and/or Complexity of Data Reviewed Labs: ordered. Decision-making details documented in ED Course. Radiology: ordered and independent interpretation performed. Decision-making details documented in ED Course. ECG/medicine tests: ordered and independent interpretation performed. Decision-making details documented in ED Course.  Risk OTC drugs. Prescription drug management. Decision regarding hospitalization.  Critical Care Total time providing critical care: 30-74 minutes   Final Clinical Impression(s) / ED Diagnoses Final diagnoses:  NSTEMI (non-ST elevated myocardial infarction) [facility])    Rx / [place] Orders ED Discharge Orders     None        Truddie Hidden, MD 02/05/21 1606

## 2021-02-05 NOTE — Progress Notes (Signed)
ANTICOAGULATION CONSULT NOTE - Initial Consult  Pharmacy Consult for Heparin Indication: chest pain/ACS  Allergies  Allergen Reactions   Floxin I.V. In Dextrose 5% [Ofloxacin] Other (See Comments)    Lowers BP Lowers BP   Terazosin Other (See Comments)    Patient Measurements: Height: 6\' 1"  (185.4 cm) Weight: 102.5 kg (226 lb) IBW/kg (Calculated) : 79.9 Heparin Dosing Weight: 100.7 kg  Vital Signs: Temp: 98.4 F (36.9 C) (01/25 1135) Temp Source: Oral (01/25 1135) BP: 163/96 (01/25 1530) Pulse Rate: 67 (01/25 1530)  Labs: Recent Labs    02/05/21 1147 02/05/21 1425  HGB 15.5  --   HCT 45.4  --   PLT 232  --   CREATININE 0.99  --   TROPONINIHS 287* 11,750*    Estimated Creatinine Clearance: 82.3 mL/min (by C-G formula based on SCr of 0.99 mg/dL).   Medical History: Past Medical History:  Diagnosis Date   Allergic rhinitis    Allergy    Anal fissure    Anxiety    from chronic pain from surgery- on Cymbalta   Arthritis    Asthma    Barrett's esophagus 03/29/2014   Carotid artery disease ([place])    Carotid Doppler normal August, 2007   Cataract    Chest pain    Coronaries normal 1996 /  nuclear..06/2002..normal...EF  56% /  stress echo.. May, 2011.... no  scar or ischemia... rate related RBBB   Diverticulosis    Dyslipidemia    Ejection fraction    EF 60%, stress echo, May, 2011   GERD (gastroesophageal reflux disease)    Headache    History of loop recorder    has since 04/04/15   HTN (hypertension)    takes Metoprolol for PVC control   Hx of colonic polyps    adenomatous   Hx of colonoscopy    Hyperlipidemia    IFG (impaired fasting glucose)    Palpitations    Benign PVCs   Parkinson disease ([place])    Prostate cancer (HCC)    RBBB (right bundle branch block)    rate related   Shingles    Stroke ([place])    TIA   TIA (transient ischemic attack) 02/2015   Per pt, had 2 strokes   Tremor    Hand tremor   Vertigo     Medications:  (Not in a  hospital admission)  Scheduled:  Infusions:   nitroGLYCERIN     PRN: nitroGLYCERIN  Assessment: 14 yom with a history of parkinson's and prior stroke. Patient is presenting with chest pain. Heparin per pharmacy consult placed for chest pain/ACS.  Patient is not on anticoagulation prior to arrival.  Hgb 15.5; plt 232  Goal of Therapy:  Heparin level 0.3-0.7 units/ml Monitor platelets by anticoagulation protocol: Yes   Plan:  Give 4000 units bolus x 1 Start heparin infusion at 1400 units/hr Check anti-Xa level in 6 hours and daily while on heparin Continue to monitor H&H and platelets  Lorelei Pont, PharmD, BCPS 02/05/2021 3:56 PM ED Clinical Pharmacist -  [phone]

## 2021-02-05 NOTE — ED Notes (Signed)
Pt stated he has a hx of CVA 2017 currently taking Plavix

## 2021-02-05 NOTE — Progress Notes (Addendum)
ANTICOAGULATION CONSULT NOTE - Follow-Up Consult  Pharmacy Consult for IV Heparin Indication: chest pain/ACS  Allergies  Allergen Reactions   Floxin I.V. In Dextrose 5% [Ofloxacin] Other (See Comments)    Lowers BP Lowers BP   Terazosin Other (See Comments)    Patient Measurements: Height: 6\' 1"  (185.4 cm) Weight: 102.5 kg (226 lb) IBW/kg (Calculated) : 79.9 Heparin Dosing Weight: 100.7 kg  Vital Signs: Temp: 98.4 F (36.9 C) (01/25 1135) Temp Source: Oral (01/25 1135) BP: 161/100 (01/25 1545) Pulse Rate: 71 (01/25 1545)  Labs: Recent Labs    02/05/21 1147 02/05/21 1425  HGB 15.5  --   HCT 45.4  --   PLT 232  --   CREATININE 0.99  --   TROPONINIHS 287* 11,750*    Estimated Creatinine Clearance: 82.3 mL/min (by C-G formula based on SCr of 0.99 mg/dL).  Medical History: Past Medical History:  Diagnosis Date   Allergic rhinitis    Allergy    Anal fissure    Anxiety    from chronic pain from surgery- on Cymbalta   Arthritis    Asthma    Barrett's esophagus 03/29/2014   Carotid artery disease ([place])    Carotid Doppler normal August, 2007   Cataract    Chest pain    Coronaries normal 1996 /  nuclear..06/2002..normal...EF  56% /  stress echo.. May, 2011.... no  scar or ischemia... rate related RBBB   Diverticulosis    Dyslipidemia    Ejection fraction    EF 60%, stress echo, May, 2011   GERD (gastroesophageal reflux disease)    Headache    History of loop recorder    has since 04/04/15   HTN (hypertension)    takes Metoprolol for PVC control   Hx of colonic polyps    adenomatous   Hx of colonoscopy    Hyperlipidemia    IFG (impaired fasting glucose)    Palpitations    Benign PVCs   Parkinson disease ([place])    Prostate cancer (HCC)    RBBB (right bundle branch block)    rate related   Shingles    Stroke [facility])    TIA   TIA (transient ischemic attack) 02/2015   Per pt, had 2 strokes   Tremor    Hand tremor   Vertigo     Assessment: 76 yr old  man with hx of Parkinson's disease and prior stroke presented with CP, NSTEMI. Pharmacy was consulted to dose IV heparin. Pt was not on anticoagulant PTA.  Three-vessel CAD was noted on noncardiac CT scan done earlier today to exclude dissection. Pt is S/P cardiac cath this afternoon, which showed LAD stenosis, but no clear culprit for STEMI. Pt rec'd heparin 4000 units IV bolus X 1, followed by heparin infusion at 1400 units/hr, prior to cardiac cath. Pharmacy is consulted to resume IV heparin infusion at 2330 PM tonight.  Hgb 15.5; plt 232; per RN, no bleeding issues observed post cath. Per cath note, plan to obtain ECHO to evaluate for possible stress-induced cardiomyopathy.  Goal of Therapy:  Heparin level 0.3-0.7 units/ml Monitor platelets by anticoagulation protocol: Yes   Plan:  Resume heparin infusion at 1300 units/hr this evening at 2330 PM Check heparin level ~7 hrs after resuming infusion Monitor daily heparin level, CBC Monitor for bleeding F/U ECHO, cardiology plans  Gillermina Hu, PharmD, BCPS, [facility] Clinical Pharmacist 02/05/2021 5:58 PM

## 2021-02-05 NOTE — Interval H&P Note (Signed)
History and Physical Interval Note:  02/05/2021 4:54 PM  Joshua [name]  has presented today for surgery, with the diagnosis of nstemi - chest pain.  The various methods of treatment have been discussed with the patient and family. After consideration of risks, benefits and other options for treatment, the patient has consented to  Procedure(s): LEFT HEART CATH AND CORONARY ANGIOGRAPHY (N/A) as a surgical intervention.  The patient's history has been reviewed, patient examined, no change in status, stable for surgery.  I have reviewed the patient's chart and labs.  Questions were answered to the patient's satisfaction.     [name]

## 2021-02-05 NOTE — ED Notes (Signed)
Troponin 267 Notified EDP

## 2021-02-05 NOTE — H&P (Signed)
CRITICAL CARE TIME: 60 minutes    Cardiology Admission History and Physical:   Patient ID: Joshua [name] MRN: [id number]; DOB: 20-Apr-1945   Admission date: 02/05/2021  PCP:  Ginger Organ., MD   [facility] Providers Cardiologist:  Will Meredith Leeds, MD  Electrophysiologist:  Will Meredith Leeds, MD       Chief Complaint: Chest, arm, and back pain commencing at 8 AM  Patient Profile:   Joshua [name] is a 76 y.o. male with CVA, obstructive sleep apnea, hyperlipidemia, GERD, hypertension, right bundle branch block.  The stroke occurred February 2017.  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  Linq monitor was implanted at that time.  He was found to have a 26% PVC burden and had ablation of PVCs, though PVCs recurred and he was put on flecainide.who is being seen 02/05/2021 for the evaluation of chest pain starting at 8 AM..  History of Present Illness:   Joshua [name] chest discomfort followed by bilateral arm tingling with radiation through to his back darted at 8 AM.  He eventually came to the emergency room.  EMS was summoned and gave 4 nitroglycerin tablets and sublingual nitroglycerin.  He states the pain was 10 out of 10 in intensity and after these interventions decreased to 5 out of 10.  The discomfort is gradually increasing in intensity once again and is now graded at 8 out of 10.  He is conversant.  Denies shortness of breath.  Has good skin color and normal blood pressures.  His wife and a daughter are at his bedside.  Significant medical problems include Parkinson's disease, hypertension, hyperlipidemia, prior CVA, right bundle branch block but no smoking or diabetes.   Past Medical History:  Diagnosis Date   Allergic rhinitis    Allergy    Anal fissure    Anxiety    from chronic pain from surgery- on Cymbalta   Arthritis    Asthma    Barrett's esophagus 03/29/2014    Carotid artery disease ([place])    Carotid Doppler normal August, 2007   Cataract    Chest pain    Coronaries normal 1996 /  nuclear..06/2002..normal...EF  56% /  stress echo.. May, 2011.... no  scar or ischemia... rate related RBBB   Diverticulosis    Dyslipidemia    Ejection fraction    EF 60%, stress echo, May, 2011   GERD (gastroesophageal reflux disease)    Headache    History of loop recorder    has since 04/04/15   HTN (hypertension)    takes Metoprolol for PVC control   Hx of colonic polyps    adenomatous   Hx of colonoscopy    Hyperlipidemia    IFG (impaired fasting glucose)    Palpitations    Benign PVCs   Parkinson disease ([place])    Prostate cancer ([place])    RBBB (right bundle branch block)    rate related   Shingles    Stroke [facility])    TIA   TIA (transient ischemic attack) 02/2015   Per pt, had 2 strokes   Tremor    Hand tremor   Vertigo     Past Surgical History:  Procedure Laterality Date   BACK SURGERY  2002,2009   x 6   CHOLECYSTECTOMY  11/30/2012   with IOC   COLONOSCOPY     ELECTROPHYSIOLOGIC STUDY N/A 09/26/2015  Procedure: V Tach Ablation (PVC);  Surgeon: Will Meredith Leeds, MD;  Location: [place] [facility];  Service: Cardiovascular;  Laterality: N/A;   EP IMPLANTABLE DEVICE N/A 04/04/2015   Procedure: Loop Recorder Insertion;  Surgeon: Will Meredith Leeds, MD;  Location: [place] [facility];  Service: Cardiovascular;  Laterality: N/A;   HERNIA REPAIR     laprascopic   KNEE SURGERY     DECEMBER 2017,LEFT KNEE SCOPED   NECK SURGERY  2002   POLYPECTOMY     ROTATOR CUFF REPAIR Left    TEE WITHOUT CARDIOVERSION N/A 04/04/2015   Procedure: TRANSESOPHAGEAL ECHOCARDIOGRAM (TEE);  Surgeon: Lelon Perla, MD;  Location: [facility] ENDOSCOPY;  Service: Cardiovascular;  Laterality: N/A;   TRIGGER FINGER RELEASE Left 12/28/2019   Procedure: RELEASE TRIGGER FINGER/A-1 PULLEY THUMB, MIDDLE AND RING;  Surgeon: Daryll Brod, MD;  Location: [place];   Service: Orthopedics;  Laterality: Left;  FAB   UPPER GASTROINTESTINAL ENDOSCOPY     V Tach ablation  09/26/2015     Medications Prior to Admission: Prior to Admission medications   Medication Sig Start Date End Date Taking? Authorizing Provider  acetaminophen (TYLENOL) 500 MG tablet Take 1,000 mg by mouth every 8 (eight) hours as needed for mild pain.   Yes [provider]  Biotin 10000 MCG TABS Take 1,000 mcg by mouth daily.   Yes [provider]  carbidopa-levodopa (SINEMET CR) 50-200 MG tablet TAKE 1 TABLET BY MOUTH EVERYDAY AT BEDTIME Patient taking differently: 1 tablet at bedtime. 11/12/20  Yes Tat, Eustace Quail, DO  carbidopa-levodopa (SINEMET IR) 25-100 MG tablet 2 tablets at 7 AM/2 tablets at 10 AM/2 at 1pm/1 at 4pm/1 at 7pm 08/05/20  Yes Tat, Eustace Quail, DO  cholecalciferol (VITAMIN D3) 25 MCG (1000 UNIT) tablet Take 1,000 Units by mouth daily.   Yes [provider]  clopidogrel (PLAVIX) 75 MG tablet Take 1 tablet (75 mg total) by mouth daily. 03/07/15  Yes Oswald Hillock, MD  Cyanocobalamin (VITAMIN B 12 PO) Take 1,000 mcg by mouth daily.   Yes [provider]  DYMISTA 137-50 MCG/ACT SUSP Place 1 puff into both nostrils at bedtime.  03/24/11  Yes [provider]  escitalopram (LEXAPRO) 10 MG tablet Take 10 mg by mouth daily. 07/21/18  Yes [provider]  finasteride (PROSCAR) 5 MG tablet Take 5 mg by mouth Daily. 04/17/11  Yes [provider]  flecainide (TAMBOCOR) 150 MG tablet Take 75 mg by mouth 2 (two) times daily. 12/28/20  Yes [provider]  lansoprazole (PREVACID) 30 MG capsule Take 30 mg by mouth 2 (two) times daily. 11/12/19  Yes [provider]  levocetirizine (XYZAL) 5 MG tablet Take 5 mg by mouth every evening.     Yes [provider]  midodrine (PROAMATINE) 5 MG tablet TAKE 1 TABLET BY MOUTH TWICE A DAY WITH A MEAL Patient taking differently: Take 5 mg by mouth 2 (two) times daily with a  meal. 01/20/21  Yes Tat, Wells Guiles S, DO  montelukast (SINGULAIR) 10 MG tablet Take 10 mg by mouth at bedtime.     Yes [provider]  Omega-3 Fatty Acids (FISH OIL) 1000 MG CAPS Take 1,000 mg by mouth daily.   Yes [provider]  PROCTOSOL HC 2.5 % rectal cream APPLY INTO AND AROUND RECTUM 2 TIMES A DAY Patient taking differently: Place 1 application rectally 2 (two) times daily as needed for hemorrhoids or anal itching. 02/12/17  Yes Irene Shipper,  MD  rosuvastatin (CRESTOR) 10 MG tablet Take 5 mg by mouth at bedtime. 02/01/21  Yes [provider]  tamsulosin (FLOMAX) 0.4 MG CAPS capsule Take 0.4 mg by mouth daily after supper.  05/25/13  Yes [provider]  traMADol (ULTRAM) 50 MG tablet Take 50 mg by mouth every 6 (six) hours as needed. for pain 09/28/17  Yes [provider]  zolpidem (AMBIEN) 10 MG tablet Take 10 mg by mouth at bedtime. 05/13/11  Yes [provider]     Allergies:    Allergies  Allergen Reactions   Floxin I.V. In Dextrose 5% [Ofloxacin] Other (See Comments)    Lowers BP Lowers BP   Terazosin Other (See Comments)    Social History:   Social History   Socioeconomic History   Marital status: Married    Spouse name: [name]    Number of children: 2   Years of education: 15   Highest education level: Some college, no degree  Occupational History   Occupation: Retired    Comment: Dance movement psychotherapist  Tobacco Use   Smoking status: Never   Smokeless tobacco: Never  Scientific laboratory technician Use: Never used  Substance and Sexual Activity   Alcohol use: No    Alcohol/week: 0.0 standard drinks   Drug use: No   Sexual activity: Not Currently  Other Topics Concern   Not on file  Social History Narrative   Lives with wife.    Caffeine use: Coffee/tea/soda- ocassionally   Right-handed   Social Determinants of Health   Financial Resource Strain: Not on file  Food Insecurity: Not on file  Transportation Needs: Not on file   Physical Activity: Not on file  Stress: Not on file  Social Connections: Not on file  Intimate Partner Violence: Not on file    Family History: His twin brother has had stents.  His father died before age 26 of heart disease. The patient's family history includes Breast cancer in his paternal grandmother; Clotting disorder in his father; Healthy in his daughter and daughter; Heart attack in his father and paternal grandfather; Heart disease in his brother and father; Hypertension in his mother. There is no history of Esophageal cancer, Stomach cancer, Rectal cancer, or Colon polyps.    ROS:  Please see the history of present illness.  Living a reasonably fulfilling life despite Parkinson's disease.  Has undergone an ablation for high burden PVCs.  He has never had syncope.  Prior cardiac catheterization 1998 revealed "arteries of an 76 year old" performed by Dr. Gwenlyn Found.  All other ROS reviewed and negative.     Physical Exam/Data:   Vitals:   02/05/21 1230 02/05/21 1400 02/05/21 1530 02/05/21 1545  BP: (!) 154/79 (!) 154/94 (!) 163/96 (!) 161/100  Pulse: 61 67 67 71  Resp: 11 10 17 17   Temp:      TempSrc:      SpO2: 98% 98% 96% 97%  Weight:      Height:       No intake or output data in the 24 hours ending 02/05/21 1643 Last 3 Weights 02/05/2021 10/04/2020 08/05/2020  Weight (lbs) 226 lb 226 lb 3.2 oz 227 lb  Weight (kg) 102.513 kg 102.604 kg 102.967 kg     Body mass index is 29.82 kg/m.  General:  Well nourished, well developed, in no acute distress skin is warm and dry HEENT: normal Neck: no carotid bruit or JVD Vascular: No carotid bruits; Distal pulses 2+ bilaterally   Cardiac:  normal S1, S2; RRR; no murmur.  No rub is heard. Lungs:  clear to auscultation bilaterally, no wheezing, rhonchi or rales  Abd: soft, nontender, no hepatomegaly  Ext: 2+ bilateral radial, carotid, and posterior tibial pulses.  No peripheral edema Musculoskeletal:  No deformities, BUE and BLE strength  normal and equal Skin: warm and dry  Neuro:  CNs 2-12 intact, no focal abnormalities noted Psych:  Normal affect    EKG:  The ECG that was done 02/05/2021 at 16: 18 was personally reviewed and demonstrates sinus rhythm, first-degree AV block, right bundle branch block without acute ST-T wave change.  Relevant CV Studies:  CT angio chest abdomen and pelvis 02/05/2021: IMPRESSION: 1. No thoracic or abdominal aortic aneurysm or dissection. 2. Three-vessel coronary artery calcifications. 3. Moderate atherosclerotic calcifications throughout the abdominal aorta and branch vessels but no significant stenosis or dissection. 4. No acute pulmonary findings. 5. Diffuse colonic diverticulosis without findings for acute diverticulitis. 6. Status post cholecystectomy without biliary dilatation. 7. Surgical changes involving the lumbar spine without complicating features.   Aortic Atherosclerosis (ICD10-I70.0).    Laboratory Data:  High Sensitivity Troponin:   Recent Labs  Lab 02/05/21 1147 02/05/21 1425  TROPONINIHS 287* 11,750*      Chemistry Recent Labs  Lab 02/05/21 1147  NA 133*  K 3.7  CL 103  CO2 22  GLUCOSE 125*  BUN 18  CREATININE 0.99  CALCIUM 9.1  GFRNONAA >60  ANIONGAP 8    Recent Labs  Lab 02/05/21 1147  PROT 7.0  ALBUMIN 4.1  AST 23  ALT 11  ALKPHOS 58  BILITOT 1.0   Lipids No results for input(s): CHOL, TRIG, HDL, LABVLDL, LDLCALC, CHOLHDL in the last 168 hours. Hematology Recent Labs  Lab 02/05/21 1147  WBC 10.4  RBC 4.79  HGB 15.5  HCT 45.4  MCV 94.8  MCH 32.4  MCHC 34.1  RDW 12.7  PLT 232   Thyroid No results for input(s): TSH, FREET4 in the last 168 hours. BNPNo results for input(s): BNP, PROBNP in the last 168 hours.  DDimer No results for input(s): DDIMER in the last 168 hours.   Radiology/Studies:  DG Chest Port 1 View  Result Date: 02/05/2021 CLINICAL DATA:  Chest pain since this morning radiating to the back in both arms with  pressure sensations and sharp twinges. EXAM: PORTABLE CHEST 1 VIEW COMPARISON:  01/06/2018 FINDINGS: Normal sized heart. Clear lungs. Mildly tortuous and partially calcified thoracic aorta. Thoracic spine degenerative changes. IMPRESSION: No acute abnormality. Electronically Signed   By: Claudie Revering M.D.   On: 02/05/2021 12:10   CT Angio Chest/Abd/Pel for Dissection W and/or W/WO  Result Date: 02/05/2021 CLINICAL DATA:  Chest and back pain. EXAM: CT ANGIOGRAPHY CHEST, ABDOMEN AND PELVIS TECHNIQUE: CT scan 09/08/2012 Multidetector CT imaging through the chest, abdomen and pelvis was performed using the standard protocol during bolus administration of intravenous contrast. Multiplanar reconstructed images and MIPs were obtained and reviewed to evaluate the vascular anatomy. RADIATION DOSE REDUCTION: This exam was performed according to the departmental dose-optimization program which includes automated exposure control, adjustment of the mA and/or kV according to patient size and/or use of iterative reconstruction technique. CONTRAST:  13mL OMNIPAQUE IOHEXOL 350 MG/ML SOLN COMPARISON:  None. FINDINGS: CTA CHEST FINDINGS Cardiovascular: The heart is normal in size. No pericardial effusion. There is mild tortuosity and calcification of the thoracic aorta but no focal aneurysm or dissection. The branch vessels are patent. There are three-vessel coronary artery calcifications noted. The pulmonary  arteries appear normal. No findings suspicious for pulmonary embolism. Mediastinum/Nodes: No mediastinal or hilar mass or adenopathy. The esophagus is grossly normal. Thyroid gland is unremarkable. Lungs/Pleura: No acute pulmonary findings. No pulmonary lesions or pleural effusions. Musculoskeletal: No chest wall mass, supraclavicular or axillary adenopathy. The bony thorax is intact. Review of the MIP images confirms the above findings. CTA ABDOMEN AND PELVIS FINDINGS VASCULAR Aorta: Moderate atherosclerotic calcifications  but no aneurysm or dissection. Celiac: Mild ostial calcifications but no stenosis or dissection. SMA: Mild ostial calcifications but no stenosis or dissection. Renals: Moderate right-sided ostial calcification with approximately 50% stenosis. No significant left renal artery stenosis. IMA: Patent Inflow: Mild atherosclerotic calcifications but no stenosis or dissection. Veins: Unremarkable Review of the MIP images confirms the above findings. NON-VASCULAR Hepatobiliary: No hepatic lesions or intrahepatic biliary dilatation. The gallbladder is surgically absent. No significant common bile duct dilatation. Pancreas: No mass, inflammation or ductal dilatation. Spleen: Normal size.  No focal lesions. Adrenals/Urinary Tract: Adrenal glands are normal. No renal lesions or hydronephrosis. No renal or obstructing ureteral calculi. No bladder calculi or bladder mass. Stomach/Bowel: The stomach, duodenum, small bowel and colon are unremarkable. No acute inflammatory changes, mass lesions or obstructive findings. The terminal ileum is normal. The appendix is normal. There is diffuse colonic diverticulosis most significantly involving the descending colon and sigmoid colon no findings for acute diverticulitis. Lymphatic: No mesenteric or retroperitoneal mass or adenopathy. Reproductive: The prostate gland and seminal vesicles are unremarkable. Other: No pelvic mass or adenopathy. No free pelvic fluid collections. No inguinal mass or adenopathy. No abdominal wall hernia or subcutaneous lesions. Surgical changes related to prior right inguinal hernia repair. No recurrent hernia. Musculoskeletal: Surgical changes involving the lumbar spine with pedicle screws, posterior rods and interbody fusion devices at L3-4, L4-5 and L5-S1. No complicating features are identified. No acute bony findings. The bony pelvis is intact. Mild bilateral hip joint degenerative changes. Review of the MIP images confirms the above findings. IMPRESSION:  1. No thoracic or abdominal aortic aneurysm or dissection. 2. Three-vessel coronary artery calcifications. 3. Moderate atherosclerotic calcifications throughout the abdominal aorta and branch vessels but no significant stenosis or dissection. 4. No acute pulmonary findings. 5. Diffuse colonic diverticulosis without findings for acute diverticulitis. 6. Status post cholecystectomy without biliary dilatation. 7. Surgical changes involving the lumbar spine without complicating features. Aortic Atherosclerosis (ICD10-I70.0). Electronically Signed   By: Marijo Sanes M.D.   On: 02/05/2021 15:41     Assessment and Plan:   None ST elevation myocardial infarction: Ongoing 8 of 10 chest discomfort.  Abnormal baseline EKG with right bundle and slight leftward axis.  Recommend urgent coronary angiography to define anatomy and help guide therapy with plans for mechanical revascularization if indicated. Coronary artery disease, native heart: Three-vessel CAD noted on noncardiac CT scan performed earlier today to exclude dissection.  Contrast was administered. Parkinson's disease: He remains functional and independent Hyperlipidemia, LDL less than 70: On statin therapy with most recent LDL cholesterol 75.  Current regimen of rosuvastatin 10 mg daily.  We will need to intensify the dose. CVA 2017: Source not identified.  Loop recorder has never demonstrated atrial fibrillation. Struct of sleep apnea: Continue CPAP Primary hypertension: Elevated systolic blood pressure Right bundle branch block: No change compared to prior tracings Premature ventricular contractions: Continue flecainide  The patient was counseled to undergo left heart catheterization, coronary angiography, and possible percutaneous coronary intervention with stent implantation. The procedural risks and benefits were discussed in detail. The risks discussed included  death, stroke, myocardial infarction, life-threatening bleeding, limb ischemia, kidney  injury, allergy, and possible emergency cardiac surgery. The risk of these significant complications were estimated to occur less than 1% of the time. After discussion, the patient has agreed to proceed.  Cath Lab notified.  Patient acuity upgraded so that coronary angiography would be performed urgently.  Risk Assessment/Risk Scores:   TIMI Risk Score for Unstable Angina or Non-ST Elevation MI:   The patient's TIMI risk score is 6, which indicates a 41% risk of all cause mortality, new or recurrent myocardial infarction or need for urgent revascularization in the next 14 days.       Severity of Illness: The appropriate patient status for this patient is INPATIENT. Inpatient status is judged to be reasonable and necessary in order to provide the required intensity of service to ensure the patient's safety. The patient's presenting symptoms, physical exam findings, and initial radiographic and laboratory data in the context of their chronic comorbidities is felt to place them at high risk for further clinical deterioration. Furthermore, it is not anticipated that the patient will be medically stable for discharge from the hospital within 2 midnights of admission.   * I certify that at the point of admission it is my clinical judgment that the patient will require inpatient hospital care spanning beyond 2 midnights from the point of admission due to high intensity of service, high risk for further deterioration and high frequency of surveillance required.*   For questions or updates, please contact [place] Please consult [email or web address] for contact info under     Signed, Sinclair Grooms, MD  02/05/2021 4:43 PM

## 2021-02-06 ENCOUNTER — Other Ambulatory Visit (HOSPITAL_COMMUNITY): Payer: Self-pay

## 2021-02-06 ENCOUNTER — Encounter (HOSPITAL_COMMUNITY): Payer: Self-pay | Admitting: Cardiovascular Disease

## 2021-02-06 ENCOUNTER — Observation Stay (HOSPITAL_COMMUNITY): Payer: Medicare HMO

## 2021-02-06 DIAGNOSIS — Z20822 Contact with and (suspected) exposure to covid-19: Secondary | ICD-10-CM | POA: Diagnosis not present

## 2021-02-06 DIAGNOSIS — I2510 Atherosclerotic heart disease of native coronary artery without angina pectoris: Secondary | ICD-10-CM | POA: Diagnosis not present

## 2021-02-06 DIAGNOSIS — J309 Allergic rhinitis, unspecified: Secondary | ICD-10-CM | POA: Diagnosis present

## 2021-02-06 DIAGNOSIS — K219 Gastro-esophageal reflux disease without esophagitis: Secondary | ICD-10-CM | POA: Diagnosis not present

## 2021-02-06 DIAGNOSIS — E785 Hyperlipidemia, unspecified: Secondary | ICD-10-CM | POA: Diagnosis not present

## 2021-02-06 DIAGNOSIS — I214 Non-ST elevation (NSTEMI) myocardial infarction: Secondary | ICD-10-CM | POA: Diagnosis not present

## 2021-02-06 DIAGNOSIS — Z7902 Long term (current) use of antithrombotics/antiplatelets: Secondary | ICD-10-CM | POA: Diagnosis not present

## 2021-02-06 DIAGNOSIS — I5033 Acute on chronic diastolic (congestive) heart failure: Secondary | ICD-10-CM | POA: Diagnosis not present

## 2021-02-06 DIAGNOSIS — Z8249 Family history of ischemic heart disease and other diseases of the circulatory system: Secondary | ICD-10-CM | POA: Diagnosis not present

## 2021-02-06 DIAGNOSIS — Z79899 Other long term (current) drug therapy: Secondary | ICD-10-CM | POA: Diagnosis not present

## 2021-02-06 DIAGNOSIS — Q2112 Patent foramen ovale: Secondary | ICD-10-CM | POA: Diagnosis not present

## 2021-02-06 DIAGNOSIS — I4510 Unspecified right bundle-branch block: Secondary | ICD-10-CM | POA: Diagnosis not present

## 2021-02-06 DIAGNOSIS — I110 Hypertensive heart disease with heart failure: Secondary | ICD-10-CM | POA: Diagnosis not present

## 2021-02-06 DIAGNOSIS — Z8673 Personal history of transient ischemic attack (TIA), and cerebral infarction without residual deficits: Secondary | ICD-10-CM | POA: Diagnosis not present

## 2021-02-06 DIAGNOSIS — G4733 Obstructive sleep apnea (adult) (pediatric): Secondary | ICD-10-CM | POA: Diagnosis not present

## 2021-02-06 DIAGNOSIS — I493 Ventricular premature depolarization: Secondary | ICD-10-CM | POA: Diagnosis present

## 2021-02-06 DIAGNOSIS — G250 Essential tremor: Secondary | ICD-10-CM | POA: Diagnosis not present

## 2021-02-06 DIAGNOSIS — I5021 Acute systolic (congestive) heart failure: Secondary | ICD-10-CM | POA: Diagnosis not present

## 2021-02-06 DIAGNOSIS — G20 Parkinson's disease: Secondary | ICD-10-CM | POA: Diagnosis not present

## 2021-02-06 DIAGNOSIS — Z888 Allergy status to other drugs, medicaments and biological substances status: Secondary | ICD-10-CM | POA: Diagnosis not present

## 2021-02-06 LAB — CBC
HCT: 44.3 % (ref 39.0–52.0)
Hemoglobin: 15.3 g/dL (ref 13.0–17.0)
MCH: 32.8 pg (ref 26.0–34.0)
MCHC: 34.5 g/dL (ref 30.0–36.0)
MCV: 95.1 fL (ref 80.0–100.0)
Platelets: 191 10*3/uL (ref 150–400)
RBC: 4.66 MIL/uL (ref 4.22–5.81)
RDW: 13.2 % (ref 11.5–15.5)
WBC: 8.7 10*3/uL (ref 4.0–10.5)
nRBC: 0 % (ref 0.0–0.2)

## 2021-02-06 LAB — ECHOCARDIOGRAM COMPLETE
AR max vel: 2.4 cm2
AV Peak grad: 8.3 mmHg
Ao pk vel: 1.44 m/s
Area-P 1/2: 3.42 cm2
Height: 73 in
S' Lateral: 2.3 cm
Weight: 3616 oz

## 2021-02-06 LAB — HEPARIN LEVEL (UNFRACTIONATED)
Heparin Unfractionated: 0.36 IU/mL (ref 0.30–0.70)
Heparin Unfractionated: 0.34 IU/mL (ref 0.30–0.70)

## 2021-02-06 LAB — LIPID PANEL
Cholesterol: 133 mg/dL (ref 0–200)
HDL: 49 mg/dL (ref >40)
LDL Cholesterol: 64 mg/dL (ref 0–99)
Total CHOL/HDL Ratio: 2.7 RATIO
Triglycerides: 102 mg/dL (ref <150)
VLDL: 20 mg/dL (ref 0–40)

## 2021-02-06 LAB — BASIC METABOLIC PANEL
Anion gap: 7 (ref 5–15)
BUN: 13 mg/dL (ref 8–23)
CO2: 26 mmol/L (ref 22–32)
Calcium: 9.2 mg/dL (ref 8.9–10.3)
Chloride: 104 mmol/L (ref 98–111)
Creatinine, Ser: 0.93 mg/dL (ref 0.61–1.24)
GFR, Estimated: >60 mL/min (ref >60)
Glucose, Bld: 100 mg/dL — ABNORMAL HIGH (ref 70–99)
Potassium: 4.2 mmol/L (ref 3.5–5.1)
Sodium: 137 mmol/L (ref 135–145)

## 2021-02-06 LAB — HEMOGLOBIN A1C
Hgb A1c MFr Bld: 5.7 % — ABNORMAL HIGH (ref 4.8–5.6)
Mean Plasma Glucose: 117 mg/dL

## 2021-02-06 LAB — POCT ACTIVATED CLOTTING TIME: Activated Clotting Time: 251 seconds

## 2021-02-06 MED ORDER — ROSUVASTATIN CALCIUM 20 MG PO TABS
20.0000 mg | ORAL_TABLET | Freq: Every day | ORAL | Status: DC
  Administered 2021-02-06: 20 mg via ORAL
  Filled 2021-02-06: qty 1

## 2021-02-06 MED ORDER — METOPROLOL SUCCINATE ER 25 MG PO TB24
12.5000 mg | ORAL_TABLET | Freq: Every day | ORAL | Status: DC
  Administered 2021-02-06 – 2021-02-07 (×2): 12.5 mg via ORAL
  Filled 2021-02-06 (×2): qty 1

## 2021-02-06 MED ORDER — ISOSORBIDE MONONITRATE ER 30 MG PO TB24
30.0000 mg | ORAL_TABLET | Freq: Every day | ORAL | Status: DC
  Administered 2021-02-06: 30 mg via ORAL
  Filled 2021-02-06: qty 1

## 2021-02-06 MED ORDER — HEART ATTACK BOUNCING BOOK
Freq: Once | Status: AC
  Filled 2021-02-06: qty 1

## 2021-02-06 MED ORDER — ANGIOPLASTY BOOK
Freq: Once | Status: AC
  Filled 2021-02-06: qty 1

## 2021-02-06 NOTE — Progress Notes (Signed)
TR BAND REMOVAL  LOCATION:    right radial  DEFLATED PER PROTOCOL:    Yes.    TIME BAND OFF / DRESSING APPLIED:2235  SITE UPON ARRIVAL:    Level 0  SITE AFTER BAND REMOVAL:    Level 0  CIRCULATION SENSATION AND MOVEMENT:    Within Normal Limits   Yes.    COMMENTS:   Pt.tolerated procedure well

## 2021-02-06 NOTE — Progress Notes (Addendum)
The patient has been seen in conjunction with Harlan Stains, NP. All aspects of care have been considered and discussed. The patient has been personally interviewed, examined, and all clinical data has been reviewed.  Small infarction possibly due to the apical LAD segment.  Global LV function is normal (EF 60%) by echo with grade 1 diastolic dysfunction.  Therefore acute on chronic diastolic heart failure. Recommend beta-blocker therapy, aspirin, Plavix, high intensity statin therapy, ambulation, cardiac rehab inpatient and outpatient, and potential discharge tomorrow.   Progress Note  Patient Name: Joshua [name] Date of Encounter: 02/06/2021  [facility] Cardiologist: Will Meredith Leeds, MD   Subjective   Overall feeling better, still with twinges of centralized chest pain that last a couple of seconds.   Inpatient Medications    Scheduled Meds:  aspirin EC  81 mg Oral Daily   azelastine  1 spray Each Nare QHS   And   fluticasone  1 spray Each Nare QHS   carbidopa-levodopa  1 tablet Oral QHS   carbidopa-levodopa  1 tablet Oral BID   carbidopa-levodopa  2 tablet Oral TID   clopidogrel  75 mg Oral Daily   escitalopram  10 mg Oral Daily   finasteride  5 mg Oral Daily   loratadine  5 mg Oral QPM   midodrine  5 mg Oral BID WC   montelukast  10 mg Oral QHS   pantoprazole  40 mg Oral Daily   rosuvastatin  5 mg Oral QHS   sodium chloride flush  3 mL Intravenous Q12H   tamsulosin  0.4 mg Oral QPC supper   zolpidem  5 mg Oral QHS   Continuous Infusions:  sodium chloride     heparin 1,300 Units/hr (02/05/21 2328)   nitroGLYCERIN 5 mcg/min (02/05/21 1628)   PRN Meds: sodium chloride, acetaminophen, nitroGLYCERIN, ondansetron (ZOFRAN) IV, sodium chloride flush, traMADol   Vital Signs    Vitals:   02/05/21 1833 02/05/21 2000 02/06/21 0403 02/06/21 0700  BP: (!) 143/96 (!) 143/86 121/70 121/82  Pulse:  66 67 83  Resp:  16 15 17   Temp:  97.9 F (36.6 C) 97.9  F (36.6 C) 98.4 F (36.9 C)  TempSrc:  Oral Oral Oral  SpO2:  95% 95% 93%  Weight:      Height:        Intake/Output Summary (Last 24 hours) at 02/06/2021 0846 Last data filed at 02/06/2021 0800 Gross per 24 hour  Intake 656.82 ml  Output 1400 ml  Net -743.18 ml   Last 3 Weights 02/05/2021 10/04/2020 08/05/2020  Weight (lbs) 226 lb 226 lb 3.2 oz 227 lb  Weight (kg) 102.513 kg 102.604 kg 102.967 kg      Telemetry    SR, PVCs - Personally Reviewed  ECG    SR 63bpm, RBBB - Personally Reviewed  Physical Exam   GEN: No acute distress.   Neck: No JVD Cardiac: RRR, no murmurs, rubs, or gallops.  Respiratory: Clear to auscultation bilaterally. GI: Soft, nontender, non-distended  MS: No edema; No deformity. Right radial cath site stable. Neuro:  Nonfocal  Psych: Normal affect   Labs    High Sensitivity Troponin:   Recent Labs  Lab 02/05/21 1147 02/05/21 1425  TROPONINIHS 287* 11,750*     Chemistry Recent Labs  Lab 02/05/21 1147 02/06/21 0634  NA 133* 137  K 3.7 4.2  CL 103 104  CO2 22 26  GLUCOSE 125* 100*  BUN 18 13  CREATININE 0.99 0.93  CALCIUM 9.1 9.2  PROT 7.0  --   ALBUMIN 4.1  --   AST 23  --   ALT 11  --   ALKPHOS 58  --   BILITOT 1.0  --   GFRNONAA >60 >60  ANIONGAP 8 7    Lipids  Recent Labs  Lab 02/06/21 0634  CHOL 133  TRIG 102  HDL 49  LDLCALC 64  CHOLHDL 2.7    Hematology Recent Labs  Lab 02/05/21 1147 02/06/21 0634  WBC 10.4 8.7  RBC 4.79 4.66  HGB 15.5 15.3  HCT 45.4 44.3  MCV 94.8 95.1  MCH 32.4 32.8  MCHC 34.1 34.5  RDW 12.7 13.2  PLT 232 191   Thyroid No results for input(s): TSH, FREET4 in the last 168 hours.  BNPNo results for input(s): BNP, PROBNP in the last 168 hours.  DDimer No results for input(s): DDIMER in the last 168 hours.   Radiology    CARDIAC CATHETERIZATION  Result Date: 02/06/2021   Prox LAD to Mid LAD lesion is 30% stenosed.   1st Diag lesion is 70% stenosed.   Mid LAD-1 lesion is 40%  stenosed.   Mid LAD-2 lesion is 60% stenosed.   Dist LAD lesion is 80% stenosed.   Ramus-1 lesion is 60% stenosed.   Ramus-2 lesion is 80% stenosed.   There is moderate left ventricular systolic dysfunction.   LV end diastolic pressure is mildly elevated.   The left ventricular ejection fraction is 35-45% by visual estimate. 1.  No clear culprit is identified for non-ST elevation myocardial infarction with no significant disease affecting the main vessels.  He does have distal LAD stenosis as well as distal ramus stenosis supplying small segments and diagonal disease.  Initially, the LAD was thought to be occluded but in reality, it had separate ostium from the left circumflex and I was able to selectively engage the LAD. 2.  Moderately reduced LV systolic function 5 to 41%.  Possible distal anterior and apical hypokinesis. Recommendations: Medical therapy with nitroglycerin drip for pain control.  Resume heparin drip 6 hours. Obtain an echocardiogram to evaluate for possible stress-induced cardiomyopathy.   DG Chest Port 1 View  Result Date: 02/05/2021 CLINICAL DATA:  Chest pain since this morning radiating to the back in both arms with pressure sensations and sharp twinges. EXAM: PORTABLE CHEST 1 VIEW COMPARISON:  01/06/2018 FINDINGS: Normal sized heart. Clear lungs. Mildly tortuous and partially calcified thoracic aorta. Thoracic spine degenerative changes. IMPRESSION: No acute abnormality. Electronically Signed   By: Claudie Revering M.D.   On: 02/05/2021 12:10   CT Angio Chest/Abd/Pel for Dissection W and/or W/WO  Result Date: 02/05/2021 CLINICAL DATA:  Chest and back pain. EXAM: CT ANGIOGRAPHY CHEST, ABDOMEN AND PELVIS TECHNIQUE: CT scan 09/08/2012 Multidetector CT imaging through the chest, abdomen and pelvis was performed using the standard protocol during bolus administration of intravenous contrast. Multiplanar reconstructed images and MIPs were obtained and reviewed to evaluate the vascular anatomy.  RADIATION DOSE REDUCTION: This exam was performed according to the departmental dose-optimization program which includes automated exposure control, adjustment of the mA and/or kV according to patient size and/or use of iterative reconstruction technique. CONTRAST:  118mL OMNIPAQUE IOHEXOL 350 MG/ML SOLN COMPARISON:  None. FINDINGS: CTA CHEST FINDINGS Cardiovascular: The heart is normal in size. No pericardial effusion. There is mild tortuosity and calcification of the thoracic aorta but no focal aneurysm or dissection. The branch vessels are patent. There are three-vessel coronary artery calcifications noted. The pulmonary  arteries appear normal. No findings suspicious for pulmonary embolism. Mediastinum/Nodes: No mediastinal or hilar mass or adenopathy. The esophagus is grossly normal. Thyroid gland is unremarkable. Lungs/Pleura: No acute pulmonary findings. No pulmonary lesions or pleural effusions. Musculoskeletal: No chest wall mass, supraclavicular or axillary adenopathy. The bony thorax is intact. Review of the MIP images confirms the above findings. CTA ABDOMEN AND PELVIS FINDINGS VASCULAR Aorta: Moderate atherosclerotic calcifications but no aneurysm or dissection. Celiac: Mild ostial calcifications but no stenosis or dissection. SMA: Mild ostial calcifications but no stenosis or dissection. Renals: Moderate right-sided ostial calcification with approximately 50% stenosis. No significant left renal artery stenosis. IMA: Patent Inflow: Mild atherosclerotic calcifications but no stenosis or dissection. Veins: Unremarkable Review of the MIP images confirms the above findings. NON-VASCULAR Hepatobiliary: No hepatic lesions or intrahepatic biliary dilatation. The gallbladder is surgically absent. No significant common bile duct dilatation. Pancreas: No mass, inflammation or ductal dilatation. Spleen: Normal size.  No focal lesions. Adrenals/Urinary Tract: Adrenal glands are normal. No renal lesions or  hydronephrosis. No renal or obstructing ureteral calculi. No bladder calculi or bladder mass. Stomach/Bowel: The stomach, duodenum, small bowel and colon are unremarkable. No acute inflammatory changes, mass lesions or obstructive findings. The terminal ileum is normal. The appendix is normal. There is diffuse colonic diverticulosis most significantly involving the descending colon and sigmoid colon no findings for acute diverticulitis. Lymphatic: No mesenteric or retroperitoneal mass or adenopathy. Reproductive: The prostate gland and seminal vesicles are unremarkable. Other: No pelvic mass or adenopathy. No free pelvic fluid collections. No inguinal mass or adenopathy. No abdominal wall hernia or subcutaneous lesions. Surgical changes related to prior right inguinal hernia repair. No recurrent hernia. Musculoskeletal: Surgical changes involving the lumbar spine with pedicle screws, posterior rods and interbody fusion devices at L3-4, L4-5 and L5-S1. No complicating features are identified. No acute bony findings. The bony pelvis is intact. Mild bilateral hip joint degenerative changes. Review of the MIP images confirms the above findings. IMPRESSION: 1. No thoracic or abdominal aortic aneurysm or dissection. 2. Three-vessel coronary artery calcifications. 3. Moderate atherosclerotic calcifications throughout the abdominal aorta and branch vessels but no significant stenosis or dissection. 4. No acute pulmonary findings. 5. Diffuse colonic diverticulosis without findings for acute diverticulitis. 6. Status post cholecystectomy without biliary dilatation. 7. Surgical changes involving the lumbar spine without complicating features. Aortic Atherosclerosis (ICD10-I70.0). Electronically Signed   By: Marijo Sanes M.D.   On: 02/05/2021 15:41    Cardiac Studies   Cath: 02/05/21    Prox LAD to Mid LAD lesion is 30% stenosed.   1st Diag lesion is 70% stenosed.   Mid LAD-1 lesion is 40% stenosed.   Mid LAD-2 lesion  is 60% stenosed.   Dist LAD lesion is 80% stenosed.   Ramus-1 lesion is 60% stenosed.   Ramus-2 lesion is 80% stenosed.   There is moderate left ventricular systolic dysfunction.   LV end diastolic pressure is mildly elevated.   The left ventricular ejection fraction is 35-45% by visual estimate.   1.  No clear culprit is identified for non-ST elevation myocardial infarction with no significant disease affecting the main vessels.  He does have distal LAD stenosis as well as distal ramus stenosis supplying small segments and diagonal disease.  Initially, the LAD was thought to be occluded but in reality, it had separate ostium from the left circumflex and I was able to selectively engage the LAD.   2.  Moderately reduced LV systolic function 5 to 92%.  Possible distal  anterior and apical hypokinesis.   Recommendations: Medical therapy with nitroglycerin drip for pain control.  Resume heparin drip 6 hours. Obtain an echocardiogram to evaluate for possible stress-induced cardiomyopathy.  Diagnostic Dominance: Right   Patient Profile     76 y.o. male male with CVA, obstructive sleep apnea, hyperlipidemia, GERD, hypertension, right bundle branch block.  The stroke occurred February 2017.  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  Linq monitor was implanted at that time.  He was found to have a 26% PVC burden and had ablation of PVCs, though PVCs recurred and he was put on flecainide.who is being seen 02/05/2021 for the evaluation of chest pain.   Assessment & Plan    NSTEMI: hsTn peaked 11750. Underwent cardiac cath noted above with diffuse moderate disease in the LAD and RI but no culprit lesion noted. His chest pain has improved but still with twinges of tightness that last a couple of seconds at a time.  -- with elevated troponin will continue IV heparin for total of 48 hrs -- on plavix (from prior CVA) -- echo  pending -- wean nitro and add Imdur 30mg  daily   HLD: on Crestor 5mg  daily PTA -- check lipids in am -- increase to 20mg  daily given CAD  HFrEF: EF noted at 35-40 on LV gram, echo pending -- he has been on metoprolol in the past, then switched to propranolol for tremor but stopped as a result of dizziness.  -- discussed adding low dose Toprol 12.5mg  back, he is agreeable -- further rec's pending echo read -- of note, he has been on midodrine 5mg  BID, but blood pressures were significantly elevated yesterday. Will DC for now and start with low dose meds to follow response  PVCs: s/p prior ablation, has been on flecainide, on hold for now. No PCI yesterday but does have CAD. Will discuss with MD  Parkinson's disease: continue home medications regimen  Hx of CVA: on statin and plavix  For questions or updates, please contact [place] Please consult [email or web address] for contact info under    Signed, Reino Bellis, NP  02/06/2021, 8:46 AM

## 2021-02-06 NOTE — TOC Benefit Eligibility Note (Signed)
Patient Teacher, English as a foreign language completed.    The patient is currently admitted and upon discharge could be taking Farxiga 10 mg.  The current 30 day co-pay is, $130.00.   The patient is currently admitted and upon discharge could be taking Jardiance 10 mg.  The current 30 day co-pay is, $130.00.   The patient is insured through [place] Patient Advocate Specialist [place] Patient Advocate Team Direct Number: [phone]  Fax: [phone]

## 2021-02-06 NOTE — Plan of Care (Signed)
  Problem: Activity: Goal: Risk for activity intolerance will decrease Outcome: Progressing   

## 2021-02-06 NOTE — Progress Notes (Signed)
ANTICOAGULATION CONSULT NOTE - Follow-Up Consult  Pharmacy Consult for IV Heparin Indication: chest pain/ACS  Allergies  Allergen Reactions   Floxin I.V. In Dextrose 5% [Ofloxacin] Other (See Comments)    Lowers BP Lowers BP   Terazosin Other (See Comments)    Patient Measurements: Height: 6\' 1"  (185.4 cm) Weight: 102.5 kg (226 lb) IBW/kg (Calculated) : 79.9 Heparin Dosing Weight: 100.7 kg  Vital Signs: Temp: 97.9 F (36.6 C) (01/26 0403) Temp Source: Oral (01/26 0403) BP: 121/70 (01/26 0403) Pulse Rate: 67 (01/26 0403)  Labs: Recent Labs    02/05/21 1147 02/05/21 1425 02/05/21 1921 02/06/21 0634  HGB 15.5  --   --  15.3  HCT 45.4  --   --  44.3  PLT 232  --   --  191  LABPROT  --   --  13.1  --   INR  --   --  1.0  --   HEPARINUNFRC  --   --   --  0.36  CREATININE 0.99  --   --   --   TROPONINIHS 287* 11,750*  --   --     Estimated Creatinine Clearance: 82.3 mL/min (by C-G formula based on SCr of 0.99 mg/dL).  Medical History: Past Medical History:  Diagnosis Date   Allergic rhinitis    Allergy    Anal fissure    Anxiety    from chronic pain from surgery- on Cymbalta   Arthritis    Asthma    Barrett's esophagus 03/29/2014   Carotid artery disease ([place])    Carotid Doppler normal August, 2007   Cataract    Chest pain    Coronaries normal 1996 /  nuclear..06/2002..normal...EF  56% /  stress echo.. May, 2011.... no  scar or ischemia... rate related RBBB   Diverticulosis    Dyslipidemia    Ejection fraction    EF 60%, stress echo, May, 2011   GERD (gastroesophageal reflux disease)    Headache    History of loop recorder    has since 04/04/15   HTN (hypertension)    takes Metoprolol for PVC control   Hx of colonic polyps    adenomatous   Hx of colonoscopy    Hyperlipidemia    IFG (impaired fasting glucose)    Palpitations    Benign PVCs   Parkinson disease ([place])    Prostate cancer (HCC)    RBBB (right bundle branch block)    rate related    Shingles    Stroke [facility])    TIA   TIA (transient ischemic attack) 02/2015   Per pt, had 2 strokes   Tremor    Hand tremor   Vertigo     Assessment: 76 yr old man with hx of Parkinson's disease and prior stroke presented with CP, NSTEMI. Pharmacy was consulted to dose IV heparin. Pt was not on anticoagulant PTA.  Three-vessel CAD was noted on noncardiac CT scan done to exclude dissection. Pt is S/P cardiac cath on 1/25, which showed LAD stenosis, but no clear culprit for STEMI. Pt rec'd heparin 4000 units IV bolus X 1, followed by heparin infusion at 1400 units/hr, prior to cardiac cath. Pharmacy is consulted to resume IV heparin infusion at 2330 PM on 1/25.  Heparin level this AM is therapeutic at 0.36. CBC stable with Hgb at 15.3 and plts at 191 this morning. No bleeding issues observed post cath. Per cath note, plan to obtain ECHO to evaluate for possible stress-induced cardiomyopathy.  Goal  of Therapy:  Heparin level 0.3-0.7 units/ml Monitor platelets by anticoagulation protocol: Yes   Plan:  Continue heparin infusion at 1300 units/hr Check heparin level in ~7 hrs F/u duration of heparin gtt with primary team Monitor daily heparin level, CBC Monitor for bleeding F/U ECHO, cardiology plans  Cathrine Muster, PharmD PGY2 Cardiology Pharmacy Resident 02/06/2021  7:11 AM  Please check [email or web address] for unit-specific pharmacy phone numbers.

## 2021-02-06 NOTE — Progress Notes (Signed)
ANTICOAGULATION CONSULT NOTE - Follow-Up Consult  Pharmacy Consult for IV Heparin Indication: chest pain/ACS  Allergies  Allergen Reactions   Floxin I.V. In Dextrose 5% [Ofloxacin] Other (See Comments)    Lowers BP Lowers BP   Terazosin Other (See Comments)    Patient Measurements: Height: 6\' 1"  (185.4 cm) Weight: 102.5 kg (226 lb) IBW/kg (Calculated) : 79.9 Heparin Dosing Weight: 100.7 kg  Vital Signs: Temp: 97.5 F (36.4 C) (01/26 1100) Temp Source: Oral (01/26 1100) BP: 120/72 (01/26 1300) Pulse Rate: 65 (01/26 1428)  Labs: Recent Labs    02/05/21 1147 02/05/21 1425 02/05/21 1921 02/06/21 0634 02/06/21 1319  HGB 15.5  --   --  15.3  --   HCT 45.4  --   --  44.3  --   PLT 232  --   --  191  --   LABPROT  --   --  13.1  --   --   INR  --   --  1.0  --   --   HEPARINUNFRC  --   --   --  0.36 0.34  CREATININE 0.99  --   --  0.93  --   TROPONINIHS 287* 11,750*  --   --   --     Estimated Creatinine Clearance: 87.6 mL/min (by C-G formula based on SCr of 0.93 mg/dL).  Medical History: Past Medical History:  Diagnosis Date   Allergic rhinitis    Allergy    Anal fissure    Anxiety    from chronic pain from surgery- on Cymbalta   Arthritis    Asthma    Barrett's esophagus 03/29/2014   Carotid artery disease ([place])    Carotid Doppler normal August, 2007   Cataract    Chest pain    Coronaries normal 1996 /  nuclear..06/2002..normal...EF  56% /  stress echo.. May, 2011.... no  scar or ischemia... rate related RBBB   Diverticulosis    Dyslipidemia    Ejection fraction    EF 60%, stress echo, May, 2011   GERD (gastroesophageal reflux disease)    Headache    History of loop recorder    has since 04/04/15   HTN (hypertension)    takes Metoprolol for PVC control   Hx of colonic polyps    adenomatous   Hx of colonoscopy    Hyperlipidemia    IFG (impaired fasting glucose)    Palpitations    Benign PVCs   Parkinson disease ([place])    Prostate cancer (HCC)     RBBB (right bundle branch block)    rate related   Shingles    Stroke [facility])    TIA   TIA (transient ischemic attack) 02/2015   Per pt, had 2 strokes   Tremor    Hand tremor   Vertigo     Assessment: 76 yr old man with hx of Parkinson's disease and prior stroke presented with CP, NSTEMI. Pharmacy was consulted to dose IV heparin. Pt was not on anticoagulant PTA.  Three-vessel CAD was noted on noncardiac CT scan done to exclude dissection. Pt is S/P cardiac cath on 1/25, which showed LAD stenosis, but no clear culprit for STEMI. Pt rec'd heparin 4000 units IV bolus X 1, followed by heparin infusion at 1400 units/hr, prior to cardiac cath. Pharmacy is consulted to resume IV heparin infusion at 2330 PM on 1/25.  Heparin level this afternoon is therapeutic at 0.34. Confirmatory level therapeutic and will move to daily levels. CBC stable  with Hgb at 15.3 and plts at 191 this morning. No bleeding issues observed post cath. Per cath note, plan to obtain ECHO to evaluate for possible stress-induced cardiomyopathy.  Goal of Therapy:  Heparin level 0.3-0.7 units/ml Monitor platelets by anticoagulation protocol: Yes   Plan:  Continue heparin infusion at 1300 units/hr for 48 hours post cath Monitor daily heparin level, CBC Monitor for bleeding F/U ECHO, cardiology plans  Cathrine Muster, PharmD PGY2 Cardiology Pharmacy Resident 02/06/2021  2:50 PM  Please check [email or web address] for unit-specific pharmacy phone numbers.

## 2021-02-06 NOTE — Progress Notes (Signed)
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  Education completed re: MI, activity restrictions, angina symptoms, when to call the doctor, when to call 911, activity & exercise progression.  Referred to phase II cardiac rehab in [place].  Patient and wife receptive to education [phone]  Liliane Channel RN, BSN 02/06/2021 1:01 PM

## 2021-02-06 NOTE — Progress Notes (Signed)
Echocardiogram 2D Echocardiogram has been performed.  Jefferey Pica 02/06/2021, 9:25 AM

## 2021-02-07 ENCOUNTER — Telehealth: Payer: Self-pay | Admitting: Neurology

## 2021-02-07 ENCOUNTER — Ambulatory Visit: Payer: Medicare HMO | Admitting: Neurology

## 2021-02-07 ENCOUNTER — Other Ambulatory Visit (HOSPITAL_COMMUNITY): Payer: Self-pay

## 2021-02-07 DIAGNOSIS — I214 Non-ST elevation (NSTEMI) myocardial infarction: Secondary | ICD-10-CM | POA: Diagnosis not present

## 2021-02-07 DIAGNOSIS — E785 Hyperlipidemia, unspecified: Secondary | ICD-10-CM | POA: Diagnosis not present

## 2021-02-07 LAB — LIPID PANEL
Cholesterol: 123 mg/dL (ref 0–200)
HDL: 46 mg/dL (ref >40)
LDL Cholesterol: 56 mg/dL (ref 0–99)
Total CHOL/HDL Ratio: 2.7 RATIO
Triglycerides: 103 mg/dL (ref <150)
VLDL: 21 mg/dL (ref 0–40)

## 2021-02-07 LAB — BASIC METABOLIC PANEL
Anion gap: 7 (ref 5–15)
BUN: 20 mg/dL (ref 8–23)
CO2: 27 mmol/L (ref 22–32)
Calcium: 9.2 mg/dL (ref 8.9–10.3)
Chloride: 103 mmol/L (ref 98–111)
Creatinine, Ser: 1.13 mg/dL (ref 0.61–1.24)
GFR, Estimated: >60 mL/min (ref >60)
Glucose, Bld: 118 mg/dL — ABNORMAL HIGH (ref 70–99)
Potassium: 4.7 mmol/L (ref 3.5–5.1)
Sodium: 137 mmol/L (ref 135–145)

## 2021-02-07 LAB — CBC
HCT: 42.1 % (ref 39.0–52.0)
Hemoglobin: 13.9 g/dL (ref 13.0–17.0)
MCH: 32.1 pg (ref 26.0–34.0)
MCHC: 33 g/dL (ref 30.0–36.0)
MCV: 97.2 fL (ref 80.0–100.0)
Platelets: 186 10*3/uL (ref 150–400)
RBC: 4.33 MIL/uL (ref 4.22–5.81)
RDW: 13.2 % (ref 11.5–15.5)
WBC: 7.7 10*3/uL (ref 4.0–10.5)
nRBC: 0 % (ref 0.0–0.2)

## 2021-02-07 LAB — HEPARIN LEVEL (UNFRACTIONATED): Heparin Unfractionated: 0.56 IU/mL (ref 0.30–0.70)

## 2021-02-07 LAB — HEMOGLOBIN A1C
Hgb A1c MFr Bld: 5.3 % (ref 4.8–5.6)
Mean Plasma Glucose: 105.41 mg/dL

## 2021-02-07 MED ORDER — ASPIRIN 81 MG PO TBEC
81.0000 mg | DELAYED_RELEASE_TABLET | Freq: Every day | ORAL | 11 refills | Status: DC
  Filled 2021-02-07: qty 30, 30d supply, fill #0

## 2021-02-07 MED ORDER — ROSUVASTATIN CALCIUM 20 MG PO TABS
20.0000 mg | ORAL_TABLET | Freq: Every day | ORAL | 0 refills | Status: AC
Start: 2021-02-07 — End: ?
  Filled 2021-02-07: qty 90, 90d supply, fill #0

## 2021-02-07 MED ORDER — METOPROLOL SUCCINATE ER 25 MG PO TB24
12.5000 mg | ORAL_TABLET | Freq: Every day | ORAL | 2 refills | Status: DC
  Filled 2021-02-07: qty 30, 60d supply, fill #0

## 2021-02-07 MED ORDER — NITROGLYCERIN 0.4 MG SL SUBL
0.4000 mg | SUBLINGUAL_TABLET | SUBLINGUAL | 2 refills | Status: AC | PRN
  Filled 2021-02-07: qty 25, 7d supply, fill #0

## 2021-02-07 MED ORDER — MEXILETINE HCL 250 MG PO CAPS
250.0000 mg | ORAL_CAPSULE | Freq: Two times a day (BID) | ORAL | 2 refills | Status: DC
  Filled 2021-02-07: qty 60, 30d supply, fill #0

## 2021-02-07 MED ORDER — MEXILETINE HCL 250 MG PO CAPS: 250.0000 mg | ORAL_CAPSULE | Freq: Two times a day (BID) | ORAL | 2 refills | Status: DC

## 2021-02-07 NOTE — Progress Notes (Signed)
ANTICOAGULATION CONSULT NOTE - Follow-Up Consult  Pharmacy Consult for IV Heparin Indication: chest pain/ACS  Allergies  Allergen Reactions   Floxin I.V. In Dextrose 5% [Ofloxacin] Other (See Comments)    Lowers BP Lowers BP   Terazosin Other (See Comments)    Patient Measurements: Height: 6\' 1"  (185.4 cm) Weight: 102.5 kg (226 lb) IBW/kg (Calculated) : 79.9 Heparin Dosing Weight: 100.7 kg  Vital Signs: Temp: 98.6 F (37 C) (01/27 0455) Temp Source: Oral (01/27 0455) BP: 106/75 (01/27 0455) Pulse Rate: 64 (01/27 0455)  Labs: Recent Labs    02/05/21 1147 02/05/21 1425 02/05/21 1921 02/06/21 0634 02/06/21 1319 02/07/21 0204  HGB 15.5  --   --  15.3  --  13.9  HCT 45.4  --   --  44.3  --  42.1  PLT 232  --   --  191  --  186  LABPROT  --   --  13.1  --   --   --   INR  --   --  1.0  --   --   --   HEPARINUNFRC  --   --   --  0.36 0.34 0.56  CREATININE 0.99  --   --  0.93  --  1.13  TROPONINIHS 287* 11,750*  --   --   --   --     Estimated Creatinine Clearance: 72.1 mL/min (by C-G formula based on SCr of 1.13 mg/dL).  Medical History: Past Medical History:  Diagnosis Date   Allergic rhinitis    Allergy    Anal fissure    Anxiety    from chronic pain from surgery- on Cymbalta   Arthritis    Asthma    Barrett's esophagus 03/29/2014   Carotid artery disease ([place])    Carotid Doppler normal August, 2007   Cataract    Chest pain    Coronaries normal 1996 /  nuclear..06/2002..normal...EF  56% /  stress echo.. May, 2011.... no  scar or ischemia... rate related RBBB   Diverticulosis    Dyslipidemia    Ejection fraction    EF 60%, stress echo, May, 2011   GERD (gastroesophageal reflux disease)    Headache    History of loop recorder    has since 04/04/15   HTN (hypertension)    takes Metoprolol for PVC control   Hx of colonic polyps    adenomatous   Hx of colonoscopy    Hyperlipidemia    IFG (impaired fasting glucose)    Palpitations    Benign PVCs    Parkinson disease ([place])    Prostate cancer (HCC)    RBBB (right bundle branch block)    rate related   Shingles    Stroke [facility])    TIA   TIA (transient ischemic attack) 02/2015   Per pt, had 2 strokes   Tremor    Hand tremor   Vertigo     Assessment: 76 yr old man with hx of Parkinson's disease and prior stroke presented with CP, NSTEMI. Pharmacy was consulted to dose IV heparin. Pt was not on anticoagulant PTA.  Three-vessel CAD was noted on noncardiac CT scan done to exclude dissection. Pt is S/P cardiac cath on 1/25, which showed LAD stenosis, but no clear culprit for STEMI. Pt rec'd heparin 4000 units IV bolus X 1, followed by heparin infusion at 1400 units/hr, prior to cardiac cath. Pharmacy is consulted to resume IV heparin infusion at 2330 PM on 1/25.  Heparin level this  morning continues to be therapeutic at 0.56. CBC stable and no bleeding issues noted.   Goal of Therapy:  Heparin level 0.3-0.7 units/ml Monitor platelets by anticoagulation protocol: Yes   Plan:  Continue heparin infusion at 1300 units/hr for 48 hours post cath (1/28 am) Monitor daily heparin level, CBC Monitor for bleeding  Erin Hearing PharmD., BCPS Clinical Pharmacist 02/07/2021 7:57 AM

## 2021-02-07 NOTE — Telephone Encounter (Signed)
Patients wife called stating Joshua [name] is in the hospital.  He had a heart attack but he is doing ok.  She just wanted to let the Dr know.

## 2021-02-07 NOTE — Progress Notes (Signed)
Patient and wife received meds from [facility]. I reviewed AVS with patient. No further questions. D/C'd via wheelchair in stable condition

## 2021-02-07 NOTE — Progress Notes (Signed)
Progress Note  Patient Name: Joshua [name] Date of Encounter: 02/07/2021  [facility] Cardiologist: Will Meredith Leeds, MD   Subjective  Feels better.  Inpatient Medications    Scheduled Meds:  aspirin EC  81 mg Oral Daily   azelastine  1 spray Each Nare QHS   And   fluticasone  1 spray Each Nare QHS   carbidopa-levodopa  1 tablet Oral QHS   carbidopa-levodopa  1 tablet Oral BID   carbidopa-levodopa  2 tablet Oral TID   clopidogrel  75 mg Oral Daily   escitalopram  10 mg Oral Daily   finasteride  5 mg Oral Daily   isosorbide mononitrate  30 mg Oral Daily   loratadine  5 mg Oral QPM   metoprolol succinate  12.5 mg Oral Daily   montelukast  10 mg Oral QHS   pantoprazole  40 mg Oral Daily   rosuvastatin  20 mg Oral QHS   sodium chloride flush  3 mL Intravenous Q12H   tamsulosin  0.4 mg Oral QPC supper   zolpidem  5 mg Oral QHS   Continuous Infusions:  sodium chloride     heparin 1,300 Units/hr (02/06/21 1806)   PRN Meds: sodium chloride, acetaminophen, nitroGLYCERIN, ondansetron (ZOFRAN) IV, sodium chloride flush, traMADol   Vital Signs    Vitals:   02/06/21 1300 02/06/21 1428 02/06/21 2116 02/07/21 0455  BP: 120/72  106/64 106/75  Pulse:  65 (!) 57 64  Resp:   17   Temp:   97.8 F (36.6 C) 98.6 F (37 C)  TempSrc:   Oral Oral  SpO2:  93% 94% 94%  Weight:      Height:        Intake/Output Summary (Last 24 hours) at 02/07/2021 0843 Last data filed at 02/07/2021 0453 Gross per 24 hour  Intake 831.06 ml  Output 675 ml  Net 156.06 ml   Last 3 Weights 02/05/2021 10/04/2020 08/05/2020  Weight (lbs) 226 lb 226 lb 3.2 oz 227 lb  Weight (kg) 102.513 kg 102.604 kg 102.967 kg      Telemetry    SR, PVCs - Personally Reviewed  ECG    Performed this morning demonstrates sinus rhythm with PVCs.  Right bundle.  No acute change compared with prior.- Personally Reviewed  Physical Exam   GEN: No acute distress.   Neck: No JVD Cardiac: RRR, no  murmurs, rubs, or gallops.  Respiratory: Clear to auscultation bilaterally. GI: Soft, nontender, non-distended  MS: Right radial cath site stable. Neuro:  Nonfocal  Psych: Normal affect   Labs    High Sensitivity Troponin:   Recent Labs  Lab 02/05/21 1147 02/05/21 1425  TROPONINIHS 287* 11,750*     Chemistry Recent Labs  Lab 02/05/21 1147 02/06/21 0634 02/07/21 0204  NA 133* 137 137  K 3.7 4.2 4.7  CL 103 104 103  CO2 22 26 27   GLUCOSE 125* 100* 118*  BUN 18 13 20   CREATININE 0.99 0.93 1.13  CALCIUM 9.1 9.2 9.2  PROT 7.0  --   --   ALBUMIN 4.1  --   --   AST 23  --   --   ALT 11  --   --   ALKPHOS 58  --   --   BILITOT 1.0  --   --   GFRNONAA >60 >60 >60  ANIONGAP 8 7 7     Lipids  Recent Labs  Lab 02/07/21 0204  CHOL 123  TRIG 103  HDL  [place] 2.7    Hematology Recent Labs  Lab 02/05/21 1147 02/06/21 0634 02/07/21 0204  WBC 10.4 8.7 7.7  RBC 4.79 4.66 4.33  HGB 15.5 15.3 13.9  HCT 45.4 44.3 42.1  MCV 94.8 95.1 97.2  MCH 32.4 32.8 32.1  MCHC 34.1 34.5 33.0  RDW 12.7 13.2 13.2  PLT 232 191 186   Thyroid No results for input(s): TSH, FREET4 in the last 168 hours.  BNPNo results for input(s): BNP, PROBNP in the last 168 hours.  DDimer No results for input(s): DDIMER in the last 168 hours.   Radiology    CARDIAC CATHETERIZATION  Result Date: 02/06/2021   Prox LAD to Mid LAD lesion is 30% stenosed.   1st Diag lesion is 70% stenosed.   Mid LAD-1 lesion is 40% stenosed.   Mid LAD-2 lesion is 60% stenosed.   Dist LAD lesion is 80% stenosed.   Ramus-1 lesion is 60% stenosed.   Ramus-2 lesion is 80% stenosed.   There is moderate left ventricular systolic dysfunction.   LV end diastolic pressure is mildly elevated.   The left ventricular ejection fraction is 35-45% by visual estimate. 1.  No clear culprit is identified for non-ST elevation myocardial infarction with no significant disease affecting the main vessels.  He does have distal LAD  stenosis as well as distal ramus stenosis supplying small segments and diagonal disease.  Initially, the LAD was thought to be occluded but in reality, it had separate ostium from the left circumflex and I was able to selectively engage the LAD. 2.  Moderately reduced LV systolic function 5 to 91%.  Possible distal anterior and apical hypokinesis. Recommendations: Medical therapy with nitroglycerin drip for pain control.  Resume heparin drip 6 hours. Obtain an echocardiogram to evaluate for possible stress-induced cardiomyopathy.   DG Chest Port 1 View  Result Date: 02/05/2021 CLINICAL DATA:  Chest pain since this morning radiating to the back in both arms with pressure sensations and sharp twinges. EXAM: PORTABLE CHEST 1 VIEW COMPARISON:  01/06/2018 FINDINGS: Normal sized heart. Clear lungs. Mildly tortuous and partially calcified thoracic aorta. Thoracic spine degenerative changes. IMPRESSION: No acute abnormality. Electronically Signed   By: Claudie Revering M.D.   On: 02/05/2021 12:10   ECHOCARDIOGRAM COMPLETE  Result Date: 02/06/2021    ECHOCARDIOGRAM REPORT   Patient Name:   Joshua [name] Date of Exam: 02/06/2021 Medical Rec #:  [id number]            Height:       73.0 in Accession #:    [id number]           Weight:       226.0 lb Date of Birth:  04-18-1945           BSA:          2.266 m Patient Age:    76 years             BP:           121/82 mmHg Patient Gender: M                    HR:           68 bpm. Exam Location:  Inpatient Procedure: 2D Echo Indications:    Nstemi  History:        Patient has prior history of Echocardiogram examinations, most  recent 04/04/2015. CAD; Risk Factors:Hypertension.  Sonographer:    Jefferey Pica Referring Phys: [place]  1. Left ventricular ejection fraction, by estimation, is 60 to 65%. The left ventricle has normal function. The left ventricle has no regional wall motion abnormalities. There is mild concentric left  ventricular hypertrophy. Left ventricular diastolic parameters are consistent with Grade I diastolic dysfunction (impaired relaxation).  2. Right ventricular systolic function is normal. The right ventricular size is normal. Tricuspid regurgitation signal is inadequate for assessing PA pressure.  3. The mitral valve is normal in structure. Trivial mitral valve regurgitation. No evidence of mitral stenosis.  4. The aortic valve is tricuspid. Aortic valve regurgitation is not visualized. Aortic valve sclerosis is present, with no evidence of aortic valve stenosis.  5. Aortic dilatation noted. There is borderline dilatation of the aortic root, measuring 37 mm. There is mild dilatation of the ascending aorta, measuring 38 mm.  6. The inferior vena cava is normal in size with greater than 50% respiratory variability, suggesting right atrial pressure of 3 mmHg. FINDINGS  Left Ventricle: Left ventricular ejection fraction, by estimation, is 60 to 65%. The left ventricle has normal function. The left ventricle has no regional wall motion abnormalities. The left ventricular internal cavity size was normal in size. There is  mild concentric left ventricular hypertrophy. Left ventricular diastolic parameters are consistent with Grade I diastolic dysfunction (impaired relaxation). Normal left ventricular filling pressure. Right Ventricle: The right ventricular size is normal. No increase in right ventricular wall thickness. Right ventricular systolic function is normal. Tricuspid regurgitation signal is inadequate for assessing PA pressure. Left Atrium: Left atrial size was normal in size. Right Atrium: Right atrial size was normal in size. Pericardium: There is no evidence of pericardial effusion. Mitral Valve: The mitral valve is normal in structure. Trivial mitral valve regurgitation. No evidence of mitral valve stenosis. Tricuspid Valve: The tricuspid valve is normal in structure. Tricuspid valve regurgitation is not  demonstrated. No evidence of tricuspid stenosis. Aortic Valve: The aortic valve is tricuspid. Aortic valve regurgitation is not visualized. Aortic valve sclerosis is present, with no evidence of aortic valve stenosis. Aortic valve peak gradient measures 8.3 mmHg. Pulmonic Valve: The pulmonic valve was normal in structure. Pulmonic valve regurgitation is not visualized. No evidence of pulmonic stenosis. Aorta: Aortic dilatation noted. There is borderline dilatation of the aortic root, measuring 37 mm. There is mild dilatation of the ascending aorta, measuring 38 mm. Venous: The inferior vena cava is normal in size with greater than 50% respiratory variability, suggesting right atrial pressure of 3 mmHg. IAS/Shunts: No atrial level shunt detected by color flow Doppler.  LEFT VENTRICLE PLAX 2D LVIDd:         5.30 cm   Diastology LVIDs:         2.30 cm   LV e' medial:    4.78 cm/s LV PW:         1.30 cm   LV E/e' medial:  9.8 LV IVS:        1.40 cm   LV e' lateral:   7.15 cm/s LVOT diam:     2.00 cm   LV E/e' lateral: 6.5 LV SV:         68 LV SV Index:   30 LVOT Area:     3.14 cm  RIGHT VENTRICLE            IVC RV Basal diam:  2.90 cm    IVC diam: 1.40 cm  RV S prime:     9.57 cm/s TAPSE (M-mode): 1.8 cm LEFT ATRIUM             Index        RIGHT ATRIUM          Index LA diam:        3.50 cm 1.54 cm/m   RA Area:     9.07 cm LA Vol (A2C):   44.0 ml 19.41 ml/m  RA Volume:   15.80 ml 6.97 ml/m LA Vol (A4C):   51.8 ml 22.86 ml/m LA Biplane Vol: 49.7 ml 21.93 ml/m  AORTIC VALVE                 PULMONIC VALVE AV Area (Vmax): 2.40 cm     PV Vmax:       0.88 m/s AV Vmax:        144.00 cm/s  PV Peak grad:  3.1 mmHg AV Peak Grad:   8.3 mmHg LVOT Vmax:      110.00 cm/s LVOT Vmean:     73.200 cm/s LVOT VTI:       0.218 m  AORTA Ao Root diam: 3.70 cm Ao Asc diam:  3.80 cm MITRAL VALVE MV Area (PHT): 3.42 cm    SHUNTS MV Decel Time: 222 msec    Systemic VTI:  0.22 m MV E velocity: 46.70 cm/s  Systemic Diam: 2.00 cm MV A  velocity: 76.70 cm/s MV E/A ratio:  0.61 Fransico Him MD Electronically signed by Fransico Him MD Signature Date/Time: 02/06/2021/9:36:43 AM    Final    CT Angio Chest/Abd/Pel for Dissection W and/or W/WO  Result Date: 02/05/2021 CLINICAL DATA:  Chest and back pain. EXAM: CT ANGIOGRAPHY CHEST, ABDOMEN AND PELVIS TECHNIQUE: CT scan 09/08/2012 Multidetector CT imaging through the chest, abdomen and pelvis was performed using the standard protocol during bolus administration of intravenous contrast. Multiplanar reconstructed images and MIPs were obtained and reviewed to evaluate the vascular anatomy. RADIATION DOSE REDUCTION: This exam was performed according to the departmental dose-optimization program which includes automated exposure control, adjustment of the mA and/or kV according to patient size and/or use of iterative reconstruction technique. CONTRAST:  168mL OMNIPAQUE IOHEXOL 350 MG/ML SOLN COMPARISON:  None. FINDINGS: CTA CHEST FINDINGS Cardiovascular: The heart is normal in size. No pericardial effusion. There is mild tortuosity and calcification of the thoracic aorta but no focal aneurysm or dissection. The branch vessels are patent. There are three-vessel coronary artery calcifications noted. The pulmonary arteries appear normal. No findings suspicious for pulmonary embolism. Mediastinum/Nodes: No mediastinal or hilar mass or adenopathy. The esophagus is grossly normal. Thyroid gland is unremarkable. Lungs/Pleura: No acute pulmonary findings. No pulmonary lesions or pleural effusions. Musculoskeletal: No chest wall mass, supraclavicular or axillary adenopathy. The bony thorax is intact. Review of the MIP images confirms the above findings. CTA ABDOMEN AND PELVIS FINDINGS VASCULAR Aorta: Moderate atherosclerotic calcifications but no aneurysm or dissection. Celiac: Mild ostial calcifications but no stenosis or dissection. SMA: Mild ostial calcifications but no stenosis or dissection. Renals: Moderate  right-sided ostial calcification with approximately 50% stenosis. No significant left renal artery stenosis. IMA: Patent Inflow: Mild atherosclerotic calcifications but no stenosis or dissection. Veins: Unremarkable Review of the MIP images confirms the above findings. NON-VASCULAR Hepatobiliary: No hepatic lesions or intrahepatic biliary dilatation. The gallbladder is surgically absent. No significant common bile duct dilatation. Pancreas: No mass, inflammation or ductal dilatation. Spleen: Normal size.  No focal lesions. Adrenals/Urinary Tract: Adrenal glands are normal. No  renal lesions or hydronephrosis. No renal or obstructing ureteral calculi. No bladder calculi or bladder mass. Stomach/Bowel: The stomach, duodenum, small bowel and colon are unremarkable. No acute inflammatory changes, mass lesions or obstructive findings. The terminal ileum is normal. The appendix is normal. There is diffuse colonic diverticulosis most significantly involving the descending colon and sigmoid colon no findings for acute diverticulitis. Lymphatic: No mesenteric or retroperitoneal mass or adenopathy. Reproductive: The prostate gland and seminal vesicles are unremarkable. Other: No pelvic mass or adenopathy. No free pelvic fluid collections. No inguinal mass or adenopathy. No abdominal wall hernia or subcutaneous lesions. Surgical changes related to prior right inguinal hernia repair. No recurrent hernia. Musculoskeletal: Surgical changes involving the lumbar spine with pedicle screws, posterior rods and interbody fusion devices at L3-4, L4-5 and L5-S1. No complicating features are identified. No acute bony findings. The bony pelvis is intact. Mild bilateral hip joint degenerative changes. Review of the MIP images confirms the above findings. IMPRESSION: 1. No thoracic or abdominal aortic aneurysm or dissection. 2. Three-vessel coronary artery calcifications. 3. Moderate atherosclerotic calcifications throughout the abdominal  aorta and branch vessels but no significant stenosis or dissection. 4. No acute pulmonary findings. 5. Diffuse colonic diverticulosis without findings for acute diverticulitis. 6. Status post cholecystectomy without biliary dilatation. 7. Surgical changes involving the lumbar spine without complicating features. Aortic Atherosclerosis (ICD10-I70.0). Electronically Signed   By: Marijo Sanes M.D.   On: 02/05/2021 15:41    Cardiac Studies   Cath: 02/05/21    Prox LAD to Mid LAD lesion is 30% stenosed.   1st Diag lesion is 70% stenosed.   Mid LAD-1 lesion is 40% stenosed.   Mid LAD-2 lesion is 60% stenosed.   Dist LAD lesion is 80% stenosed.   Ramus-1 lesion is 60% stenosed.   Ramus-2 lesion is 80% stenosed.   There is moderate left ventricular systolic dysfunction.   LV end diastolic pressure is mildly elevated.   The left ventricular ejection fraction is 35-45% by visual estimate.   1.  No clear culprit is identified for non-ST elevation myocardial infarction with no significant disease affecting the main vessels.  He does have distal LAD stenosis as well as distal ramus stenosis supplying small segments and diagonal disease.  Initially, the LAD was thought to be occluded but in reality, it had separate ostium from the left circumflex and I was able to selectively engage the LAD.   2.  Moderately reduced LV systolic function 5 to 78%.  Possible distal anterior and apical hypokinesis.   Recommendations: Medical therapy with nitroglycerin drip for pain control.  Resume heparin drip 6 hours. Obtain an echocardiogram to evaluate for possible stress-induced cardiomyopathy.  Diagnostic Dominance: Right   Patient Profile     76 y.o. male male with CVA, obstructive sleep apnea, hyperlipidemia, GERD, hypertension, right bundle branch block.  The stroke occurred February 2017.  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  Linq  monitor was implanted at that time.  He was found to have a 26% PVC burden and had ablation of PVCs and started flecainide who is being seen 02/05/2021  non-ST elevation MI Assessment & Plan    NSTEMI: Distal LAD disease likely culprit for presentation.  Not amenable to PCI.  Beta-blocker and long-acting nitrate therapy started.  May not be able to tolerate long-acting nitrate therapy due to Parkinson's disease and tendency towards orthostasis.  Will DC Imdur  and use as needed sublingual nitroglycerin. HLD: on Crestor 5mg  daily PTA has been uptitrated to 20mg  daily given CAD. HFrEF: EF 60% on echo.  Grade 1 diastolic dysfunction noted.  Low dose Toprol 12.5mg  is needed for anti-ischemic protection but not for systolic heart failure.  If it lowers blood pressure too much, we should discontinue. (Of note is been on midodrine for blood pressure support) PVCs: Coronary disease is minimal but still present.  Probably need to discontinue flecainide.  Needs to be discussed with EP before time being we will discontinue. Parkinson's disease: continue home medications regimen and watch blood pressure. Hx of CVA: on statin and plavix    Able to discharge today if no subsequent problems.  For questions or updates, please contact [place] Please consult [email or web address] for contact info under    Signed, Sinclair Grooms, MD  02/07/2021, 8:43 AM

## 2021-02-07 NOTE — Telephone Encounter (Signed)
Called patients wife and let her know just to reach out whenever patient is up to it and we would;d work him in. Patients wife said probably the end of next week

## 2021-02-07 NOTE — Discharge Summary (Addendum)
The patient has been seen in conjunction with Reino Bellis, NP. All aspects of care have been considered and discussed. The patient has been personally interviewed, examined, and all clinical data has been reviewed.  Doing okay. No pain with ambulation. Likely source for NSTEM is distal LAD being managed with medical therapy. Use PRN SL NTG.  Discharge Summary    Patient ID: Joshua [name] MRN: [id number]; DOB: September 24, 1945  Admit date: 02/05/2021 Discharge date: 02/07/2021  PCP:  Ginger Organ., MD   [facility] Providers Cardiologist:  Sinclair Grooms, MD  Electrophysiologist:  Constance Haw, MD  {  Discharge Diagnoses    Principal Problem:   NSTEMI (non-ST elevated myocardial infarction) [facility]) Active Problems:   Hyperlipidemia with target LDL less than 70   HTN (hypertension)   PVC (premature ventricular contraction)   Essential tremor   Diagnostic Studies/Procedures    Cath: 02/05/21     Prox LAD to Mid LAD lesion is 30% stenosed.   1st Diag lesion is 70% stenosed.   Mid LAD-1 lesion is 40% stenosed.   Mid LAD-2 lesion is 60% stenosed.   Dist LAD lesion is 80% stenosed.   [name]-1 lesion is 60% stenosed.   [name]-2 lesion is 80% stenosed.   There is moderate left ventricular systolic dysfunction.   LV end diastolic pressure is mildly elevated.   The left ventricular ejection fraction is 35-45% by visual estimate.   1.  No clear culprit is identified for non-ST elevation myocardial infarction with no significant disease affecting the main vessels.  He does have distal LAD stenosis as well as distal [name] stenosis supplying small segments and diagonal disease.  Initially, the LAD was thought to be occluded but in reality, it had separate ostium from the left circumflex and I was able to selectively engage the LAD.   2.  Moderately reduced LV systolic function 5 to 42%.  Possible distal anterior and apical hypokinesis.    Recommendations: Medical therapy with nitroglycerin drip for pain control.  Resume heparin drip 6 hours. Obtain an echocardiogram to evaluate for possible stress-induced cardiomyopathy.   Diagnostic Dominance: Right    Echo: 02/06/21  IMPRESSIONS     1. Left ventricular ejection fraction, by estimation, is 60 to 65%. The  left ventricle has normal function. The left ventricle has no regional  wall motion abnormalities. There is mild concentric left ventricular  hypertrophy. Left ventricular diastolic  parameters are consistent with Grade I diastolic dysfunction (impaired  relaxation).   2. Right ventricular systolic function is normal. The right ventricular  size is normal. Tricuspid regurgitation signal is inadequate for assessing  PA pressure.   3. The mitral valve is normal in structure. Trivial mitral valve  regurgitation. No evidence of mitral stenosis.   4. The aortic valve is tricuspid. Aortic valve regurgitation is not  visualized. Aortic valve sclerosis is present, with no evidence of aortic  valve stenosis.   5. Aortic dilatation noted. There is borderline dilatation of the aortic  root, measuring 37 mm. There is mild dilatation of the ascending aorta,  measuring 38 mm.   6. The inferior vena cava is normal in size with greater than 50%  respiratory variability, suggesting right atrial pressure of 3 mmHg.   FINDINGS   Left Ventricle: Left ventricular ejection fraction, by estimation, is 60  to 65%. The left ventricle has normal function. The left ventricle has no  regional wall motion abnormalities. The left ventricular internal cavity  size was normal in size. There is   mild concentric left ventricular hypertrophy. Left ventricular diastolic  parameters are consistent with Grade I diastolic dysfunction (impaired  relaxation). Normal left ventricular filling pressure.   Right Ventricle: The right ventricular size is normal. No increase in  right ventricular wall  thickness. Right ventricular systolic function is  normal. Tricuspid regurgitation signal is inadequate for assessing PA  pressure.   Left Atrium: Left atrial size was normal in size.   Right Atrium: Right atrial size was normal in size.   Pericardium: There is no evidence of pericardial effusion.   Mitral Valve: The mitral valve is normal in structure. Trivial mitral  valve regurgitation. No evidence of mitral valve stenosis.   Tricuspid Valve: The tricuspid valve is normal in structure. Tricuspid  valve regurgitation is not demonstrated. No evidence of tricuspid  stenosis.   Aortic Valve: The aortic valve is tricuspid. Aortic valve regurgitation is  not visualized. Aortic valve sclerosis is present, with no evidence of  aortic valve stenosis. Aortic valve peak gradient measures 8.3 mmHg.   Pulmonic Valve: The pulmonic valve was normal in structure. Pulmonic valve  regurgitation is not visualized. No evidence of pulmonic stenosis.   Aorta: Aortic dilatation noted. There is borderline dilatation of the  aortic root, measuring 37 mm. There is mild dilatation of the ascending  aorta, measuring 38 mm.   Venous: The inferior vena cava is normal in size with greater than 50%  respiratory variability, suggesting right atrial pressure of 3 mmHg.   IAS/Shunts: No atrial level shunt detected by color flow Doppler.  _____________   History of Present Illness     Joshua [name] is a 76 y.o. male with  CVA, obstructive sleep apnea, hyperlipidemia, GERD, hypertension, right bundle branch block.  The stroke occurred February 2017.  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  Linq monitor was implanted at that time.  He was found to have a 26% PVC burden and had ablation of PVCs, though PVCs recurred and he was put on flecainide who was seen 02/05/2021 for the evaluation of chest pain.  Hospital Course     NSTEMI:  hsTn peaked 11750. Underwent cardiac cath noted above with diffuse moderate disease in the LAD and RI but no culprit lesion noted. He was weaned from IV nitro and heparin. Placed on Imdur, but with his soft blood pressures this was stopped as he was not able to tolerate. -- on ASA, plavix, statin, BB   HLD: on Crestor 5mg  daily PTA -- LDL 56 -- increased Crestor to 20mg  daily given CAD -- FLP/LFTs in 8 weeks    HFpEF: EF noted at 35-40 on LV gram, but follow up echo with LVEF of 60-65% -- he has been on metoprolol in the past, then switched to propranolol for tremor but stopped as a result of dizziness.  -- discussed adding low dose Toprol 12.5mg  back, he was able to tolerate -- of note, he has been on midodrine 5mg  BID, but blood pressures were stable in the 734L systolic. Asked that he stop the midodrine for now   PVCs: s/p prior ablation, has been on flecainide. This was stopped in the setting of his NSTEMI -- spoke with Dr. Curt Bears with recommendation for mexiletine 250mg  BID (this was sent to [facility] as the [facility] did not have in stock)   Parkinson's disease: continue home medications  regimen   Hx of CVA: on statin and plavix  Did the patient have an acute coronary syndrome (MI, NSTEMI, STEMI, etc) this admission?:  Yes                               AHA/ACC Clinical Performance & Quality Measures: Aspirin prescribed? - Yes ADP Receptor Inhibitor (Plavix/Clopidogrel, Brilinta/Ticagrelor or Effient/Prasugrel) prescribed (includes medically managed patients)? - Yes Beta Blocker prescribed? - Yes High Intensity Statin (Lipitor 40-80mg  or Crestor 20-40mg ) prescribed? - Yes EF assessed during THIS hospitalization? - Yes For EF <40%, was ACEI/ARB prescribed? - Not Applicable (EF >/= 57%) For EF <40%, Aldosterone Antagonist (Spironolactone or Eplerenone) prescribed? - Not Applicable (EF >/= 90%) Cardiac Rehab Phase II ordered (including medically managed patients)? - Yes       The  patient will be scheduled for a TOC follow up appointment in 10-14 days.  A message has been sent to the [facility] and Scheduling Pool at the office where the patient should be seen for follow up.  _____________  Discharge Vitals Blood pressure 134/82, pulse 69, temperature 97.6 F (36.4 C), resp. rate 18, height 6\' 1"  (1.854 m), weight 102.5 kg, SpO2 96 %.  Filed Weights   02/05/21 1144  Weight: 102.5 kg    Labs & Radiologic Studies    CBC Recent Labs    02/05/21 1147 02/06/21 0634 02/07/21 0204  WBC 10.4 8.7 7.7  NEUTROABS 6.6  --   --   HGB 15.5 15.3 13.9  HCT 45.4 44.3 42.1  MCV 94.8 95.1 97.2  PLT 232 191 383   Basic Metabolic Panel Recent Labs    02/06/21 0634 02/07/21 0204  NA 137 137  K 4.2 4.7  CL 104 103  CO2 26 27  GLUCOSE 100* 118*  BUN 13 20  CREATININE 0.93 1.13  CALCIUM 9.2 9.2   Liver Function Tests Recent Labs    02/05/21 1147  AST 23  ALT 11  ALKPHOS 58  BILITOT 1.0  PROT 7.0  ALBUMIN 4.1   Recent Labs    02/05/21 1147  LIPASE 26   High Sensitivity Troponin:   Recent Labs  Lab 02/05/21 1147 02/05/21 1425  TROPONINIHS 287* 11,750*    BNP Invalid input(s): POCBNP D-Dimer No results for input(s): DDIMER in the last 72 hours. Hemoglobin A1C Recent Labs    02/07/21 0204  HGBA1C 5.3   Fasting Lipid Panel Recent Labs    02/07/21 0204  CHOL 123  HDL 46  LDLCALC 56  TRIG 103  CHOLHDL 2.7   Thyroid Function Tests No results for input(s): TSH, T4TOTAL, T3FREE, THYROIDAB in the last 72 hours.  Invalid input(s): FREET3 _____________  CARDIAC CATHETERIZATION  Result Date: 02/06/2021   Prox LAD to Mid LAD lesion is 30% stenosed.   1st Diag lesion is 70% stenosed.   Mid LAD-1 lesion is 40% stenosed.   Mid LAD-2 lesion is 60% stenosed.   Dist LAD lesion is 80% stenosed.   [name]-1 lesion is 60% stenosed.   [name]-2 lesion is 80% stenosed.   There is moderate left ventricular systolic dysfunction.   LV end diastolic pressure is  mildly elevated.   The left ventricular ejection fraction is 35-45% by visual estimate. 1.  No clear culprit is identified for non-ST elevation myocardial infarction with no significant disease affecting the main vessels.  He does have distal LAD stenosis as well as distal [name] stenosis supplying  small segments and diagonal disease.  Initially, the LAD was thought to be occluded but in reality, it had separate ostium from the left circumflex and I was able to selectively engage the LAD. 2.  Moderately reduced LV systolic function 5 to 16%.  Possible distal anterior and apical hypokinesis. Recommendations: Medical therapy with nitroglycerin drip for pain control.  Resume heparin drip 6 hours. Obtain an echocardiogram to evaluate for possible stress-induced cardiomyopathy.   DG Chest Port 1 View  Result Date: 02/05/2021 CLINICAL DATA:  Chest pain since this morning radiating to the back in both arms with pressure sensations and sharp twinges. EXAM: PORTABLE CHEST 1 VIEW COMPARISON:  01/06/2018 FINDINGS: Normal sized heart. Clear lungs. Mildly tortuous and partially calcified thoracic aorta. Thoracic spine degenerative changes. IMPRESSION: No acute abnormality. Electronically Signed   By: Claudie Revering M.D.   On: 02/05/2021 12:10   ECHOCARDIOGRAM COMPLETE  Result Date: 02/06/2021    ECHOCARDIOGRAM REPORT   Patient Name:   TADEO BESECKER Date of Exam: 02/06/2021 Medical Rec #:  [id number]            Height:       73.0 in Accession #:    [id number]           Weight:       226.0 lb Date of Birth:  08-Feb-1945           BSA:          2.266 m Patient Age:    35 years             BP:           121/82 mmHg Patient Gender: M                    HR:           68 bpm. Exam Location:  Inpatient Procedure: 2D Echo Indications:    Nstemi  History:        Patient has prior history of Echocardiogram examinations, most                 recent 04/04/2015. CAD; Risk Factors:Hypertension.  Sonographer:    Jefferey Pica  Referring Phys: Amber  1. Left ventricular ejection fraction, by estimation, is 60 to 65%. The left ventricle has normal function. The left ventricle has no regional wall motion abnormalities. There is mild concentric left ventricular hypertrophy. Left ventricular diastolic parameters are consistent with Grade I diastolic dysfunction (impaired relaxation).  2. Right ventricular systolic function is normal. The right ventricular size is normal. Tricuspid regurgitation signal is inadequate for assessing PA pressure.  3. The mitral valve is normal in structure. Trivial mitral valve regurgitation. No evidence of mitral stenosis.  4. The aortic valve is tricuspid. Aortic valve regurgitation is not visualized. Aortic valve sclerosis is present, with no evidence of aortic valve stenosis.  5. Aortic dilatation noted. There is borderline dilatation of the aortic root, measuring 37 mm. There is mild dilatation of the ascending aorta, measuring 38 mm.  6. The inferior vena cava is normal in size with greater than 50% respiratory variability, suggesting right atrial pressure of 3 mmHg. FINDINGS  Left Ventricle: Left ventricular ejection fraction, by estimation, is 60 to 65%. The left ventricle has normal function. The left ventricle has no regional wall motion abnormalities. The left ventricular internal cavity size was normal in size. There is  mild concentric left ventricular hypertrophy. Left ventricular diastolic parameters are  consistent with Grade I diastolic dysfunction (impaired relaxation). Normal left ventricular filling pressure. Right Ventricle: The right ventricular size is normal. No increase in right ventricular wall thickness. Right ventricular systolic function is normal. Tricuspid regurgitation signal is inadequate for assessing PA pressure. Left Atrium: Left atrial size was normal in size. Right Atrium: Right atrial size was normal in size. Pericardium: There is no evidence of  pericardial effusion. Mitral Valve: The mitral valve is normal in structure. Trivial mitral valve regurgitation. No evidence of mitral valve stenosis. Tricuspid Valve: The tricuspid valve is normal in structure. Tricuspid valve regurgitation is not demonstrated. No evidence of tricuspid stenosis. Aortic Valve: The aortic valve is tricuspid. Aortic valve regurgitation is not visualized. Aortic valve sclerosis is present, with no evidence of aortic valve stenosis. Aortic valve peak gradient measures 8.3 mmHg. Pulmonic Valve: The pulmonic valve was normal in structure. Pulmonic valve regurgitation is not visualized. No evidence of pulmonic stenosis. Aorta: Aortic dilatation noted. There is borderline dilatation of the aortic root, measuring 37 mm. There is mild dilatation of the ascending aorta, measuring 38 mm. Venous: The inferior vena cava is normal in size with greater than 50% respiratory variability, suggesting right atrial pressure of 3 mmHg. IAS/Shunts: No atrial level shunt detected by color flow Doppler.  LEFT VENTRICLE PLAX 2D LVIDd:         5.30 cm   Diastology LVIDs:         2.30 cm   LV e' medial:    4.78 cm/s LV PW:         1.30 cm   LV E/e' medial:  9.8 LV IVS:        1.40 cm   LV e' lateral:   7.15 cm/s LVOT diam:     2.00 cm   LV E/e' lateral: 6.5 LV SV:         68 LV SV Index:   30 LVOT Area:     3.14 cm  RIGHT VENTRICLE            IVC RV Basal diam:  2.90 cm    IVC diam: 1.40 cm RV S prime:     9.57 cm/s TAPSE (M-mode): 1.8 cm LEFT ATRIUM             Index        RIGHT ATRIUM          Index LA diam:        3.50 cm 1.54 cm/m   RA Area:     9.07 cm LA Vol (A2C):   44.0 ml 19.41 ml/m  RA Volume:   15.80 ml 6.97 ml/m LA Vol (A4C):   51.8 ml 22.86 ml/m LA Biplane Vol: 49.7 ml 21.93 ml/m  AORTIC VALVE                 PULMONIC VALVE AV Area (Vmax): 2.40 cm     PV Vmax:       0.88 m/s AV Vmax:        144.00 cm/s  PV Peak grad:  3.1 mmHg AV Peak Grad:   8.3 mmHg LVOT Vmax:      110.00 cm/s LVOT  Vmean:     73.200 cm/s LVOT VTI:       0.218 m  AORTA Ao Root diam: 3.70 cm Ao Asc diam:  3.80 cm MITRAL VALVE MV Area (PHT): 3.42 cm    SHUNTS MV Decel Time: 222 msec    Systemic VTI:  0.22 m MV  E velocity: 46.70 cm/s  Systemic Diam: 2.00 cm MV A velocity: 76.70 cm/s MV E/A ratio:  0.61 Fransico Him MD Electronically signed by Fransico Him MD Signature Date/Time: 02/06/2021/9:36:43 AM    Final    CT Angio Chest/Abd/Pel for Dissection W and/or W/WO  Result Date: 02/05/2021 CLINICAL DATA:  Chest and back pain. EXAM: CT ANGIOGRAPHY CHEST, ABDOMEN AND PELVIS TECHNIQUE: CT scan 09/08/2012 Multidetector CT imaging through the chest, abdomen and pelvis was performed using the standard protocol during bolus administration of intravenous contrast. Multiplanar reconstructed images and MIPs were obtained and reviewed to evaluate the vascular anatomy. RADIATION DOSE REDUCTION: This exam was performed according to the departmental dose-optimization program which includes automated exposure control, adjustment of the mA and/or kV according to patient size and/or use of iterative reconstruction technique. CONTRAST:  165mL OMNIPAQUE IOHEXOL 350 MG/ML SOLN COMPARISON:  None. FINDINGS: CTA CHEST FINDINGS Cardiovascular: The heart is normal in size. No pericardial effusion. There is mild tortuosity and calcification of the thoracic aorta but no focal aneurysm or dissection. The branch vessels are patent. There are three-vessel coronary artery calcifications noted. The pulmonary arteries appear normal. No findings suspicious for pulmonary embolism. Mediastinum/Nodes: No mediastinal or hilar mass or adenopathy. The esophagus is grossly normal. Thyroid gland is unremarkable. Lungs/Pleura: No acute pulmonary findings. No pulmonary lesions or pleural effusions. Musculoskeletal: No chest wall mass, supraclavicular or axillary adenopathy. The bony thorax is intact. Review of the MIP images confirms the above findings. CTA ABDOMEN AND  PELVIS FINDINGS VASCULAR Aorta: Moderate atherosclerotic calcifications but no aneurysm or dissection. Celiac: Mild ostial calcifications but no stenosis or dissection. SMA: Mild ostial calcifications but no stenosis or dissection. Renals: Moderate right-sided ostial calcification with approximately 50% stenosis. No significant left renal artery stenosis. IMA: Patent Inflow: Mild atherosclerotic calcifications but no stenosis or dissection. Veins: Unremarkable Review of the MIP images confirms the above findings. NON-VASCULAR Hepatobiliary: No hepatic lesions or intrahepatic biliary dilatation. The gallbladder is surgically absent. No significant common bile duct dilatation. Pancreas: No mass, inflammation or ductal dilatation. Spleen: Normal size.  No focal lesions. Adrenals/Urinary Tract: Adrenal glands are normal. No renal lesions or hydronephrosis. No renal or obstructing ureteral calculi. No bladder calculi or bladder mass. Stomach/Bowel: The stomach, duodenum, small bowel and colon are unremarkable. No acute inflammatory changes, mass lesions or obstructive findings. The terminal ileum is normal. The appendix is normal. There is diffuse colonic diverticulosis most significantly involving the descending colon and sigmoid colon no findings for acute diverticulitis. Lymphatic: No mesenteric or retroperitoneal mass or adenopathy. Reproductive: The prostate gland and seminal vesicles are unremarkable. Other: No pelvic mass or adenopathy. No free pelvic fluid collections. No inguinal mass or adenopathy. No abdominal wall hernia or subcutaneous lesions. Surgical changes related to prior right inguinal hernia repair. No recurrent hernia. Musculoskeletal: Surgical changes involving the lumbar spine with pedicle screws, posterior rods and interbody fusion devices at L3-4, L4-5 and L5-S1. No complicating features are identified. No acute bony findings. The bony pelvis is intact. Mild bilateral hip joint degenerative  changes. Review of the MIP images confirms the above findings. IMPRESSION: 1. No thoracic or abdominal aortic aneurysm or dissection. 2. Three-vessel coronary artery calcifications. 3. Moderate atherosclerotic calcifications throughout the abdominal aorta and branch vessels but no significant stenosis or dissection. 4. No acute pulmonary findings. 5. Diffuse colonic diverticulosis without findings for acute diverticulitis. 6. Status post cholecystectomy without biliary dilatation. 7. Surgical changes involving the lumbar spine without complicating features. Aortic Atherosclerosis (ICD10-I70.0). Electronically Signed  By: Marijo Sanes M.D.   On: 02/05/2021 15:41   Disposition   Pt is being discharged home today in good condition.  Follow-up Plans & Appointments     Follow-up Information     Deberah Pelton, NP Follow up on 02/14/2021.   Specialty: Cardiology Why: at 2:45pm for your follow up appt with Dr. Darliss Ridgel' NP [place] [facility] information: [address] [place] [address] [phone]                Discharge Instructions     AMB Referral to Cardiac Rehabilitation - Phase II   Complete by: As directed    Diagnosis: NSTEMI   After initial evaluation and assessments completed: Virtual Based Care may be provided alone or in conjunction with Phase 2 Cardiac Rehab based on patient barriers.: Yes   Amb Referral to Cardiac Rehabilitation   Complete by: As directed    Diagnosis: NSTEMI   After initial evaluation and assessments completed: Virtual Based Care may be provided alone or in conjunction with Phase 2 Cardiac Rehab based on patient barriers.: Yes   Call MD for:  difficulty breathing, headache or visual disturbances   Complete by: As directed    Call MD for:  persistant dizziness or light-headedness   Complete by: As directed    Diet - low sodium heart healthy   Complete by: As directed    Increase activity slowly   Complete by: As directed        Discharge  Medications   Allergies as of 02/07/2021       Reactions   Floxin I.v. In Dextrose 5% [ofloxacin] Other (See Comments)   Lowers BP Lowers BP   Terazosin Other (See Comments)        Medication List     STOP taking these medications    flecainide 150 MG tablet Commonly known as: TAMBOCOR   midodrine 5 MG tablet Commonly known as: PROAMATINE       TAKE these medications    acetaminophen 500 MG tablet Commonly known as: TYLENOL Take 1,000 mg by mouth every 8 (eight) hours as needed for mild pain.   aspirin 81 MG EC tablet Take 1 tablet (81 mg total) by mouth daily. Swallow whole. Start taking on: February 08, 2021   Biotin 10000 MCG Tabs Take 1,000 mcg by mouth daily.   carbidopa-levodopa 25-100 MG tablet Commonly known as: SINEMET IR 2 tablets at 7 AM/2 tablets at 10 AM/2 at 1pm/1 at 4pm/1 at 7pm What changed: Another medication with the same name was changed. Make sure you understand how and when to take each.   carbidopa-levodopa 50-200 MG tablet Commonly known as: SINEMET CR TAKE 1 TABLET BY MOUTH EVERYDAY AT BEDTIME What changed: See the new instructions.   cholecalciferol 25 MCG (1000 UNIT) tablet Commonly known as: VITAMIN D3 Take 1,000 Units by mouth daily.   clopidogrel 75 MG tablet Commonly known as: Plavix Take 1 tablet (75 mg total) by mouth daily.   Dymista 137-50 MCG/ACT Susp Generic drug: Azelastine-Fluticasone Place 1 puff into both nostrils at bedtime.   escitalopram 10 MG tablet Commonly known as: LEXAPRO Take 10 mg by mouth daily.   finasteride 5 MG tablet Commonly known as: PROSCAR Take 5 mg by mouth Daily.   Fish Oil 1000 MG Caps Take 1,000 mg by mouth daily.   lansoprazole 30 MG capsule Commonly known as: PREVACID Take 30 mg by mouth 2 (two) times daily.   levocetirizine 5 MG tablet Commonly  known as: XYZAL Take 5 mg by mouth every evening.   metoprolol succinate 25 MG 24 hr tablet Commonly known as: TOPROL-XL Take 0.5  tablets (12.5 mg total) by mouth daily. Start taking on: February 08, 2021   mexiletine 250 MG capsule Commonly known as: MEXITIL Take 1 capsule (250 mg total) by mouth 2 (two) times daily.   montelukast 10 MG tablet Commonly known as: SINGULAIR Take 10 mg by mouth at bedtime.   nitroGLYCERIN 0.4 MG SL tablet Commonly known as: NITROSTAT Place 1 tablet (0.4 mg total) under the tongue every 5 (five) minutes as needed for chest pain (Do not give more than 3 SL tablets in 15 minutes.).   Proctosol HC 2.5 % rectal cream Generic drug: hydrocortisone APPLY INTO AND AROUND RECTUM 2 TIMES A DAY What changed: See the new instructions.   rosuvastatin 20 MG tablet Commonly known as: CRESTOR Take 1 tablet (20 mg total) by mouth at bedtime. What changed:  medication strength how much to take   tamsulosin 0.4 MG Caps capsule Commonly known as: FLOMAX Take 0.4 mg by mouth daily after supper.   traMADol 50 MG tablet Commonly known as: ULTRAM Take 50 mg by mouth every 6 (six) hours as needed. for pain   VITAMIN B 12 PO Take 1,000 mcg by mouth daily.   zolpidem 10 MG tablet Commonly known as: AMBIEN Take 10 mg by mouth at bedtime.           Outstanding Labs/Studies   FLP/LFTs in 8 weeks   Duration of Discharge Encounter   Greater than 30 minutes including physician time.  Signed, Reino Bellis, NP 02/07/2021, 11:51 AM

## 2021-02-07 NOTE — Progress Notes (Signed)
°  Transition of Care [facility]) Screening Note   Patient Details  Name: Joshua [name] Date of Birth: 1945-05-07   Transition of Care [facility]) CM/SW Contact:    Milas Gain, [place] Phone Number: 02/07/2021, 10:52 AM    Transition of Care Department [facility]) has reviewed patient and no TOC needs have been identified at this time. We will continue to monitor patient advancement through interdisciplinary progression rounds. If new patient transition needs arise, please place a TOC consult.

## 2021-02-08 ENCOUNTER — Other Ambulatory Visit: Payer: Self-pay | Admitting: Neurology

## 2021-02-08 DIAGNOSIS — G20 Parkinson's disease: Secondary | ICD-10-CM

## 2021-02-10 ENCOUNTER — Other Ambulatory Visit: Payer: Self-pay

## 2021-02-12 ENCOUNTER — Ambulatory Visit: Payer: Medicare HMO | Admitting: Internal Medicine

## 2021-02-13 ENCOUNTER — Other Ambulatory Visit: Payer: Self-pay | Admitting: Neurology

## 2021-02-13 DIAGNOSIS — G20 Parkinson's disease: Secondary | ICD-10-CM

## 2021-02-13 NOTE — Progress Notes (Addendum)
Cardiology Office Note:    Date:  02/14/2021   ID:  Joshua [name], Joshua [name] 1945/04/09, MRN [id number]  PCP:  Ginger Organ., MD   [facility] Providers Cardiologist:  Sinclair Grooms, MD Electrophysiologist:  Constance Haw, MD      Referring MD: Ginger Organ., MD   Presents to the clinic today for follow-up evaluation of his essential hypertension and coronary artery disease  History of Present Illness:    Joshua [name] is a 76 y.o. male with a hx of hypertension, RBBB, carotid artery disease, CVA, PVCs, NSTEMI, sleep apnea, GERD, essential tremor, Parkinson's disease, palpitations, and hyperlipidemia.  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  Linq monitor inserted and showed 26% PVC burden.  He underwent ablation for PVCs.  PVCs recurred and he was placed on flecainide.  He was admitted to the hospital 02/05/2021 and discharged on 02/07/2021.  He was diagnosed with NSTEMI.  He presented to the hospital with chest pain.  His troponins were elevated at 11,750.  He underwent cardiac catheterization which showed diffuse moderate disease in his LAD and RI.  No culprit lesion was noted.  His IV nitro and heparin were weaned and he was placed on Imdur.  He was hypotensive and his Imdur was stopped.  His cholesterol panel showed an LDL of 56.  His Crestor was increased to 20 mg daily.  His echocardiogram showed an LVEF of 60-65%.  He was previously on metoprolol.  He was switched to propranolol for tremor however, it was stopped due to dizziness.  He was placed on metoprolol succinate 12.5 mg.  He was previously on midodrine 5 mg twice daily however, his blood pressures were stable in the 892 systolic and his midodrine was not resumed.  He presents to the clinic today for follow-up evaluation states he has not felt well for the past 30-60 minutes.  He reports he started feeling nauseous after eating.  We checked his blood  sugar in clinic today and it was 139.  His blood pressure is 102/66.  He has been checking his blood pressure at home and it has been running in the low 100s over 60s-80s.  He was also noticed an increase in his PVCs.  He was previously on midodrine prior to his admission to the hospital.  We reviewed his Parkinson's medication.  He has noticed that he has been having increased tremors since being discharged from the hospital.  He has follow-up with neurology at the end of the month.  I will represcribe his midodrine twice daily 5 mg as needed for systolic blood pressure less than 100.  Have him increase his p.o. hydration, order CBC and BMP, and plan follow-up in 3 to 4 months with Dr. Tamala Julian.   Today he denies chest pain, shortness of breath, lower extremity edema, fatigue, palpitations, melena, hematuria, hemoptysis, diaphoresis, weakness, presyncope, syncope, orthopnea, and PND.     Past Medical History:  Diagnosis Date   Allergic rhinitis    Allergy    Anal fissure    Anxiety    from chronic pain from surgery- on Cymbalta   Arthritis    Asthma    Barrett's esophagus 03/29/2014   Carotid artery disease ([place])    Carotid Doppler normal August, 2007   Cataract    Chest pain    Coronaries normal 1996 /  nuclear..06/2002..normal...EF  56% /  stress echo.. May, 2011.Marland KitchenMarland KitchenMarland Kitchen  no  scar or ischemia... rate related RBBB   Diverticulosis    Dyslipidemia    Ejection fraction    EF 60%, stress echo, May, 2011   GERD (gastroesophageal reflux disease)    Headache    History of loop recorder    has since 04/04/15   HTN (hypertension)    takes Metoprolol for PVC control   Hx of colonic polyps    adenomatous   Hx of colonoscopy    Hyperlipidemia    IFG (impaired fasting glucose)    Palpitations    Benign PVCs   Parkinson disease ([place])    Prostate cancer (HCC)    RBBB (right bundle branch block)    rate related   Shingles    Stroke [facility])    TIA   TIA (transient ischemic attack) 02/2015    Per pt, had 2 strokes   Tremor    Hand tremor   Vertigo     Past Surgical History:  Procedure Laterality Date   BACK SURGERY  2002,2009   x 6   CHOLECYSTECTOMY  11/30/2012   with IOC   COLONOSCOPY     ELECTROPHYSIOLOGIC STUDY N/A 09/26/2015   Procedure: V Tach Ablation (PVC);  Surgeon: Will Meredith Leeds, MD;  Location: [place] [facility];  Service: Cardiovascular;  Laterality: N/A;   EP IMPLANTABLE DEVICE N/A 04/04/2015   Procedure: Loop Recorder Insertion;  Surgeon: Will Meredith Leeds, MD;  Location: [place] [facility];  Service: Cardiovascular;  Laterality: N/A;   HERNIA REPAIR     laprascopic   KNEE SURGERY     DECEMBER 2017,LEFT KNEE SCOPED   LEFT HEART CATH AND CORONARY ANGIOGRAPHY N/A 02/05/2021   Procedure: LEFT HEART CATH AND CORONARY ANGIOGRAPHY;  Surgeon: Wellington Hampshire, MD;  Location: [place] [facility];  Service: Cardiovascular;  Laterality: N/A;   NECK SURGERY  2002   POLYPECTOMY     ROTATOR CUFF REPAIR Left    TEE WITHOUT CARDIOVERSION N/A 04/04/2015   Procedure: TRANSESOPHAGEAL ECHOCARDIOGRAM (TEE);  Surgeon: Lelon Perla, MD;  Location: [facility] END[facility];  Service: Cardiovascular;  Laterality: N/A;   TRIGGER FINGER RELEASE Left 12/28/2019   Procedure: RELEASE TRIGGER FINGER/A-1 PULLEY THUMB, MIDDLE AND RING;  Surgeon: Daryll Brod, MD;  Location: [place];  Service: Orthopedics;  Laterality: Left;  FAB   UPPER GASTROINTESTINAL ENDOSCOPY     V Tach ablation  09/26/2015    Current Medications: Current Meds  Medication Sig   acetaminophen (TYLENOL) 500 MG tablet Take 1,000 mg by mouth every 8 (eight) hours as needed for mild pain.   aspirin 81 MG EC tablet Take 1 tablet (81 mg total) by mouth daily. Swallow whole.   Biotin 10000 MCG TABS Take 1,000 mcg by mouth daily.   carbidopa-levodopa (SINEMET CR) 50-200 MG tablet TAKE 1 TABLET BY MOUTH EVERYDAY AT BEDTIME (Patient taking differently: 1 tablet at bedtime.)   carbidopa-levodopa (SINEMET IR)  25-100 MG tablet 2 TABLETS AT 7 AM/2 TABLETS AT 10 AM/2 AT 1PM/1 AT 4PM/1 AT 7PM   cholecalciferol (VITAMIN D3) 25 MCG (1000 UNIT) tablet Take 1,000 Units by mouth daily.   clopidogrel (PLAVIX) 75 MG tablet Take 1 tablet (75 mg total) by mouth daily.   Cyanocobalamin (VITAMIN B 12 PO) Take 1,000 mcg by mouth daily.   DYMISTA 137-50 MCG/ACT SUSP Place 1 puff into both nostrils at bedtime.    escitalopram (LEXAPRO) 10 MG tablet Take 10 mg by mouth daily.   finasteride (PROSCAR) 5  MG tablet Take 5 mg by mouth Daily.   lansoprazole (PREVACID) 30 MG capsule Take 30 mg by mouth 2 (two) times daily.   levocetirizine (XYZAL) 5 MG tablet Take 5 mg by mouth every evening.     metoprolol succinate (TOPROL-XL) 25 MG 24 hr tablet Take 1/2 tablet (12.5 mg total) by mouth daily.   mexiletine (MEXITIL) 250 MG capsule Take 1 capsule (250 mg total) by mouth 2 (two) times daily.   midodrine (PROAMATINE) 5 MG tablet Take 1 tablet (5 mg total) by mouth 2 (two) times daily as needed. Take if systolic blood pressure gets below 100   montelukast (SINGULAIR) 10 MG tablet Take 10 mg by mouth at bedtime.     nitroGLYCERIN (NITROSTAT) 0.4 MG SL tablet Place 1 tablet (0.4 mg total) under the tongue every 5 (five) minutes as needed for chest pain (Do not give more than 3 SL tablets in 15 minutes.).   Omega-3 Fatty Acids (FISH OIL) 1000 MG CAPS Take 1,000 mg by mouth daily.   PROCTOSOL HC 2.5 % rectal cream APPLY INTO AND AROUND RECTUM 2 TIMES A DAY (Patient taking differently: Place 1 application rectally 2 (two) times daily as needed for hemorrhoids or anal itching.)   rosuvastatin (CRESTOR) 20 MG tablet Take 1 tablet (20 mg total) by mouth at bedtime.   tamsulosin (FLOMAX) 0.4 MG CAPS capsule Take 0.4 mg by mouth daily after supper.    traMADol (ULTRAM) 50 MG tablet Take 50 mg by mouth every 6 (six) hours as needed. for pain   zolpidem (AMBIEN) 10 MG tablet Take 10 mg by mouth at bedtime.     Allergies:   Floxin i.v. in  dextrose 5% [ofloxacin] and Terazosin   Social History   Socioeconomic History   Marital status: Married    Spouse name: [name]    Number of children: 2   Years of education: 15   Highest education level: Some college, no degree  Occupational History   Occupation: Retired    Comment: Dance movement psychotherapist  Tobacco Use   Smoking status: Never   Smokeless tobacco: Never  Scientific laboratory technician Use: Never used  Substance and Sexual Activity   Alcohol use: No    Alcohol/week: 0.0 standard drinks   Drug use: No   Sexual activity: Not Currently  Other Topics Concern   Not on file  Social History Narrative   Lives with wife.    Caffeine use: Coffee/tea/soda- ocassionally   Right-handed   Social Determinants of Health   Financial Resource Strain: Not on file  Food Insecurity: Not on file  Transportation Needs: Not on file  Physical Activity: Not on file  Stress: Not on file  Social Connections: Not on file     Family History: The patient's family history includes Breast cancer in his paternal grandmother; Clotting disorder in his father; Healthy in his daughter and daughter; Heart attack in his father and paternal grandfather; Heart disease in his brother and father; Hypertension in his mother. There is no history of Esophageal cancer, Stomach cancer, Rectal cancer, or Colon polyps.  ROS:   Please see the history of present illness.     All other systems reviewed and are negative.   Risk Assessment/Calculations:           Physical Exam:    VS:  BP 102/66    Pulse 75    Ht 6\' 1"  (1.854 m)    Wt 217 lb 9.6 oz (98.7 kg)  SpO2 96%    BMI 28.71 kg/m     Wt Readings from Last 3 Encounters:  02/14/21 217 lb 9.6 oz (98.7 kg)  02/05/21 226 lb (102.5 kg)  10/04/20 226 lb 3.2 oz (102.6 kg)     GEN:  Well nourished, well developed in no acute distress HEENT: Normal NECK: No JVD; No carotid bruits LYMPHATICS: No lymphadenopathy CARDIAC: RRR, no murmurs, rubs,  gallops RESPIRATORY:  Clear to auscultation without rales, wheezing or rhonchi  ABDOMEN: Soft, non-tender, non-distended MUSCULOSKELETAL:  No edema; No deformity  SKIN: Warm and dry NEUROLOGIC:  Alert and oriented x 3 PSYCHIATRIC:  Normal affect    EKGs/Labs/Other Studies Reviewed:    The following studies were reviewed today:   Echocardiogram 02/06/2021 IMPRESSIONS     1. Left ventricular ejection fraction, by estimation, is 60 to 65%. The  left ventricle has normal function. The left ventricle has no regional  wall motion abnormalities. There is mild concentric left ventricular  hypertrophy. Left ventricular diastolic  parameters are consistent with Grade I diastolic dysfunction (impaired  relaxation).   2. Right ventricular systolic function is normal. The right ventricular  size is normal. Tricuspid regurgitation signal is inadequate for assessing  PA pressure.   3. The mitral valve is normal in structure. Trivial mitral valve  regurgitation. No evidence of mitral stenosis.   4. The aortic valve is tricuspid. Aortic valve regurgitation is not  visualized. Aortic valve sclerosis is present, with no evidence of aortic  valve stenosis.   5. Aortic dilatation noted. There is borderline dilatation of the aortic  root, measuring 37 mm. There is mild dilatation of the ascending aorta,  measuring 38 mm.   6. The inferior vena cava is normal in size with greater than 50%  respiratory variability, suggesting right atrial pressure of 3 mmHg.   FINDINGS   Left Ventricle: Left ventricular ejection fraction, by estimation, is 60  to 65%. The left ventricle has normal function. The left ventricle has no  regional wall motion abnormalities. The left ventricular internal cavity  size was normal in size. There is   mild concentric left ventricular hypertrophy. Left ventricular diastolic  parameters are consistent with Grade I diastolic dysfunction (impaired  relaxation). Normal left  ventricular filling pressure.   Right Ventricle: The right ventricular size is normal. No increase in  right ventricular wall thickness. Right ventricular systolic function is  normal. Tricuspid regurgitation signal is inadequate for assessing PA  pressure.   Left Atrium: Left atrial size was normal in size.   Right Atrium: Right atrial size was normal in size.   Pericardium: There is no evidence of pericardial effusion.   Mitral Valve: The mitral valve is normal in structure. Trivial mitral  valve regurgitation. No evidence of mitral valve stenosis.   Tricuspid Valve: The tricuspid valve is normal in structure. Tricuspid  valve regurgitation is not demonstrated. No evidence of tricuspid  stenosis.   Aortic Valve: The aortic valve is tricuspid. Aortic valve regurgitation is  not visualized. Aortic valve sclerosis is present, with no evidence of  aortic valve stenosis. Aortic valve peak gradient measures 8.3 mmHg.   Pulmonic Valve: The pulmonic valve was normal in structure. Pulmonic valve  regurgitation is not visualized. No evidence of pulmonic stenosis.   Aorta: Aortic dilatation noted. There is borderline dilatation of the  aortic root, measuring 37 mm. There is mild dilatation of the ascending  aorta, measuring 38 mm.   Venous: The inferior vena cava is normal in  size with greater than 50%  respiratory variability, suggesting right atrial pressure of 3 mmHg.   IAS/Shunts: No atrial level shunt detected by color flow Doppler.  Cardiac catheterization 02/05/2021    Prox LAD to Mid LAD lesion is 30% stenosed.   1st Diag lesion is 70% stenosed.   Mid LAD-1 lesion is 40% stenosed.   Mid LAD-2 lesion is 60% stenosed.   Dist LAD lesion is 80% stenosed.   Ramus-1 lesion is 60% stenosed.   Ramus-2 lesion is 80% stenosed.   There is moderate left ventricular systolic dysfunction.   LV end diastolic pressure is mildly elevated.   The left ventricular ejection fraction is 35-45%  by visual estimate.   1.  No clear culprit is identified for non-ST elevation myocardial infarction with no significant disease affecting the main vessels.  He does have distal LAD stenosis as well as distal ramus stenosis supplying small segments and diagonal disease.  Initially, the LAD was thought to be occluded but in reality, it had separate ostium from the left circumflex and I was able to selectively engage the LAD.   2.  Moderately reduced LV systolic function 5 to 37%.  Possible distal anterior and apical hypokinesis.   Recommendations: Medical therapy with nitroglycerin drip for pain control.  Resume heparin drip 6 hours. Obtain an echocardiogram to evaluate for possible stress-induced cardiomyopathy.   Diagnostic Dominance: Right    EKG: None today.  Recent Labs: 02/05/2021: ALT 11 02/07/2021: BUN 20; Creatinine, Ser 1.13; Hemoglobin 13.9; Platelets 186; Potassium 4.7; Sodium 137  Recent Lipid Panel    Component Value Date/Time   CHOL 123 02/07/2021 0204   TRIG 103 02/07/2021 0204   HDL 46 02/07/2021 0204   CHOLHDL 2.7 02/07/2021 0204   VLDL 21 02/07/2021 0204   LDLCALC 56 02/07/2021 0204    ASSESSMENT & PLAN    NSTEMI-no further episodes of arm neck back or chest discomfort.  Underwent cardiac catheterization 02/05/2021 which showed no clear culprit lesion.  His nitro and heparin were titrated down he was placed on Imdur.  He became hypotensive and his Imdur was stopped. Continue metoprolol succinate 12.5 mg daily, rosuvastatin, omega-3 fatty acids, aspirin Heart healthy low-sodium diet-salty 6 given Increase physical activity as tolerated Order CBC, BMP  Hyperlipidemia-02/07/2021: Cholesterol 123; HDL 46; LDL Cholesterol 56; Triglycerides 103; VLDL 21 Continue rosuvastatin, aspirin, omega-3 fatty acids Heart healthy low-sodium diet-salty 6 given Increase physical activity as tolerated   Essential hypertension-BP today 102/66.  Well-controlled at home. Continue  metoprolol, Heart healthy low-sodium diet-salty 6 given Increase physical activity as tolerated May restart midodrine twice daily as needed for a blood pressure systolic less than 628  PVCs-does note occasional episodes of PVCs.  Status post PVC ablation.  He was previously placed on flecainide.  Flecainide was stopped in the setting of NSTEMI. Continue mexiletine   Parkinson's Disease-follows with PCP.  Disposition: Follow-up with Dr. Tamala Julian in 3 to 4 months.           Medication Adjustments/Labs and Tests Ordered: Current medicines are reviewed at length with the patient today.  Concerns regarding medicines are outlined above.  Orders Placed This Encounter  Procedures   CBC   Basic Metabolic Panel (BMET)   Meds ordered this encounter  Medications   midodrine (PROAMATINE) 5 MG tablet    Sig: Take 1 tablet (5 mg total) by mouth 2 (two) times daily as needed. Take if systolic blood pressure gets below 100    Dispense:  60  tablet    Refill:  3    Patient Instructions  Medication Instructions:  Continue current medications  *If you need a refill on your cardiac medications before your next appointment, please call your pharmacy*   Lab Work: CBC and BMP  If you have labs (blood work) drawn today and your tests are completely normal, you will receive your results only by: [place] (if you have MyChart) OR A paper copy in the mail If you have any lab test that is abnormal or we need to change your treatment, we will call you to review the results.   Testing/Procedures: None Ordered   Follow-Up: At [facility], you and your health needs are our priority.  As part of our continuing mission to provide you with exceptional heart care, we have created designated Provider Care Teams.  These Care Teams include your primary Cardiologist (physician) and Advanced Practice Providers (APPs -  Physician Assistants and Nurse Practitioners) who all work together to provide  you with the care you need, when you need it.  We recommend signing up for the patient portal called "MyChart".  Sign up information is provided on this After Visit Summary.  MyChart is used to connect with patients for Virtual Visits (Telemedicine).  Patients are able to view lab/test results, encounter notes, upcoming appointments, etc.  Non-urgent messages can be sent to your provider as well.   To learn more about what you can do with MyChart, go to [email or web address].    Your next appointment:   3 month(s)  The format for your next appointment:   In Person  Provider:   Sinclair Grooms, MD     Other Instructions It's ok to take Medodrine if systolic blood pressure is below 100 Maintain Hydrations    Signed, Deberah Pelton, NP  02/14/2021 3:14 PM      Notice: This dictation was prepared with Dragon dictation along with smaller phrase technology. Any transcriptional errors that result from this process are unintentional and may not be corrected upon review.  I spent 14 minutes examining this patient, reviewing medications, and using patient centered shared decision making involving her cardiac care.  Prior to her visit I spent greater than 20 minutes reviewing her past medical history,  medications, and prior cardiac tests.

## 2021-02-14 ENCOUNTER — Other Ambulatory Visit: Payer: Self-pay

## 2021-02-14 ENCOUNTER — Encounter (HOSPITAL_BASED_OUTPATIENT_CLINIC_OR_DEPARTMENT_OTHER): Payer: Self-pay | Admitting: General Practice

## 2021-02-14 ENCOUNTER — Ambulatory Visit (INDEPENDENT_AMBULATORY_CARE_PROVIDER_SITE_OTHER): Payer: Medicare HMO | Admitting: General Practice

## 2021-02-14 VITALS — BP 102/66 | HR 75 | Ht 73.0 in | Wt 217.6 lb

## 2021-02-14 DIAGNOSIS — I10 Essential (primary) hypertension: Secondary | ICD-10-CM | POA: Diagnosis not present

## 2021-02-14 DIAGNOSIS — I214 Non-ST elevation (NSTEMI) myocardial infarction: Secondary | ICD-10-CM

## 2021-02-14 DIAGNOSIS — G20 Parkinson's disease: Secondary | ICD-10-CM

## 2021-02-14 DIAGNOSIS — E782 Mixed hyperlipidemia: Secondary | ICD-10-CM

## 2021-02-14 DIAGNOSIS — I493 Ventricular premature depolarization: Secondary | ICD-10-CM | POA: Diagnosis not present

## 2021-02-14 DIAGNOSIS — G20A1 Parkinson's disease without dyskinesia, without mention of fluctuations: Secondary | ICD-10-CM

## 2021-02-14 MED ORDER — MIDODRINE HCL 5 MG PO TABS: 5.0000 mg | ORAL_TABLET | Freq: Two times a day (BID) | ORAL | 3 refills | Status: DC | PRN

## 2021-02-14 NOTE — Patient Instructions (Addendum)
Medication Instructions:  START- Midodrine 5 mg by mouth twice a day as need if systolic blood pressure gets below 100  *If you need a refill on your cardiac medications before your next appointment, please call your pharmacy*   Lab Work: CBC and BMP  If you have labs (blood work) drawn today and your tests are completely normal, you will receive your results only by: [place] (if you have MyChart) OR A paper copy in the mail If you have any lab test that is abnormal or we need to change your treatment, we will call you to review the results.   Testing/Procedures: None Ordered   Follow-Up: At [facility], you and your health needs are our priority.  As part of our continuing mission to provide you with exceptional heart care, we have created designated Provider Care Teams.  These Care Teams include your primary Cardiologist (physician) and Advanced Practice Providers (APPs -  Physician Assistants and Nurse Practitioners) who all work together to provide you with the care you need, when you need it.  We recommend signing up for the patient portal called "MyChart".  Sign up information is provided on this After Visit Summary.  MyChart is used to connect with patients for Virtual Visits (Telemedicine).  Patients are able to view lab/test results, encounter notes, upcoming appointments, etc.  Non-urgent messages can be sent to your provider as well.   To learn more about what you can do with MyChart, go to [email or web address].    Your next appointment:   3 month(s)  The format for your next appointment:   In Person  Provider:   Sinclair Grooms, MD     Other Instructions  Maintain Hydration

## 2021-02-15 LAB — CBC
Hematocrit: 44.3 % (ref 37.5–51.0)
Hemoglobin: 15.3 g/dL (ref 13.0–17.7)
MCH: 31.7 pg (ref 26.6–33.0)
MCHC: 34.5 g/dL (ref 31.5–35.7)
MCV: 92 fL (ref 79–97)
Platelets: 262 10*3/uL (ref 150–450)
RBC: 4.82 x10E6/uL (ref 4.14–5.80)
RDW: 12.7 % (ref 11.6–15.4)
WBC: 7.9 10*3/uL (ref 3.4–10.8)

## 2021-02-15 LAB — BASIC METABOLIC PANEL
BUN/Creatinine Ratio: 19 (ref 10–24)
BUN: 20 mg/dL (ref 8–27)
CO2: 18 mmol/L — ABNORMAL LOW (ref 20–29)
Calcium: 9.6 mg/dL (ref 8.6–10.2)
Chloride: 100 mmol/L (ref 96–106)
Creatinine, Ser: 1.07 mg/dL (ref 0.76–1.27)
Glucose: 152 mg/dL — ABNORMAL HIGH (ref 70–99)
Potassium: 4.1 mmol/L (ref 3.5–5.2)
Sodium: 136 mmol/L (ref 134–144)
eGFR: 73 mL/min/{1.73_m2} (ref >59)

## 2021-02-17 ENCOUNTER — Telehealth (HOSPITAL_COMMUNITY): Payer: Self-pay

## 2021-02-17 NOTE — Telephone Encounter (Signed)
Called patient to see if he is interested in the Cardiac Rehab Program. Patient expressed interest. Explained scheduling process and went over insurance, patient verbalized understanding. Will contact patient for scheduling once f/u has been completed.  °

## 2021-02-17 NOTE — Telephone Encounter (Signed)
Pt insurance is active and benefits verified through [facility] Co-pay 0, DED $400/$400 met, out of pocket $3,000/$502.65 met, co-insurance 10%. no pre-authorization required. Passport, 02/17/2021'@9' :20am, REF# [id number]   Will contact patient to see if he is interested in the Cardiac Rehab Program. If interested, patient will need to complete follow up appt. Once completed, patient will be contacted for scheduling upon review by the RN Navigator.

## 2021-02-19 ENCOUNTER — Telehealth: Payer: Self-pay | Admitting: Interventional Cardiology

## 2021-02-19 NOTE — Telephone Encounter (Signed)
This is a [place] pt.  I called pt and advised he would need to continue his Mexiletine. Advised I will send message to our PA nurse to see about getting medication covered. Pt has about 2.5 weeks worth of medication left.

## 2021-02-19 NOTE — Telephone Encounter (Signed)
Pt c/o medication issue:  1. Name of Medication: mexiletine (MEXITIL) 250 MG capsule  2. How are you currently taking this medication (dosage and times per day)? Take 1 capsule (250 mg total) by mouth 2 (two) times daily.  3. Are you having a reaction (difficulty breathing--STAT)? no  4. What is your medication issue? Patient is having issue with his insurance concerning this medication. Blair Hailey is saying he needs special approve for the medication. Patient wants to know if this is continuous medication or is he just suppose to take for the 30 days. Please advise

## 2021-02-20 NOTE — Telephone Encounter (Signed)
**Note De-Identified Christapher Gillian Obfuscation** Mexiletine PA started through covermymeds. Key: Gwendel Hanson

## 2021-02-21 NOTE — Telephone Encounter (Signed)
**Note De-Identified Joshua [name] Obfuscation** Letter received Joshua [name] fax from [facility] stating that they have approved the pts Mexiletine for coverage until 01/11/2022.  I have notified the pt and [facility] of this approval.

## 2021-02-27 ENCOUNTER — Telehealth: Payer: Self-pay

## 2021-02-27 NOTE — Telephone Encounter (Signed)
**Note De-Identified [name] Obfuscation** Mexiletine PA approved per letter received from [facility] as follows: Date: 02/20/2021 [place], [place] [address] Member Name: Joshua [name] Member ID Number: [id number]  As a member of [employer] Plan w/Rx (PPO), we are pleased to inform you that, upon  review of the information provided by you or your doctor, we have approved the requested  coverage for the following prescription drug(s): MEXILETINE HCL Capsule Type of coverage approved: Non-Formulary This approval authorizes your coverage from 01/12/2021 - 01/11/2022  I have notified [id number] - [place], [place] - [phone] of this approval.

## 2021-03-03 ENCOUNTER — Encounter: Payer: Self-pay | Admitting: Internal Medicine

## 2021-03-03 ENCOUNTER — Ambulatory Visit (INDEPENDENT_AMBULATORY_CARE_PROVIDER_SITE_OTHER): Payer: Medicare HMO | Admitting: Internal Medicine

## 2021-03-03 VITALS — BP 112/64 | HR 88 | Ht 73.0 in | Wt 218.0 lb

## 2021-03-03 DIAGNOSIS — Z86010 Personal history of colonic polyps: Secondary | ICD-10-CM

## 2021-03-03 DIAGNOSIS — R194 Change in bowel habit: Secondary | ICD-10-CM | POA: Diagnosis not present

## 2021-03-03 DIAGNOSIS — I214 Non-ST elevation (NSTEMI) myocardial infarction: Secondary | ICD-10-CM | POA: Diagnosis not present

## 2021-03-03 DIAGNOSIS — Z7902 Long term (current) use of antithrombotics/antiplatelets: Secondary | ICD-10-CM

## 2021-03-03 NOTE — Patient Instructions (Signed)
If you are age 75 or older, your body mass index should be between 23-30. Your Body mass index is 28.76 kg/m. If this is out of the aforementioned range listed, please consider follow up with your Primary Care Provider.  If you are age 21 or younger, your body mass index should be between 19-25. Your Body mass index is 28.76 kg/m. If this is out of the aformentioned range listed, please consider follow up with your Primary Care Provider.   ________________________________________________________  The [facility] providers would like to encourage you to use [facility] to communicate with providers for non-urgent requests or questions.  Due to long hold times on the telephone, sending your provider a message by [facility] may be a faster and more efficient way to get a response.  Please allow 48 business hours for a response.  Please remember that this is for non-urgent requests.  _______________________________________________________  Call us back in about 6 months after you have talked to cardiology

## 2021-03-03 NOTE — Progress Notes (Addendum)
HISTORY OF PRESENT ILLNESS:  Joshua [name] is a 76 y.o. male with multiple significant medical problems including coronary artery disease for which she is on chronic Plavix and aspirin therapy.  The patient has a history of Barrett's esophagus with high-grade dysplasia for which she has undergone endoscopic ablation and is being followed at [facility].  No evidence of recurrence.  No evidence of cancer.  He is maintained on lansoprazole 30 mg twice daily.  He presents today regarding surveillance colonoscopy.  He has a history of multiple adenomas and advanced adenomas.  Previous examinations 2006, 2009, 2014, 2017.  At the time of his last examination he was found to have 1 diminutive adenoma.  Also left-sided diverticulosis and hemorrhoids.  Patient was diagnosed with Parkinson's disease.  He is on Sinemet.  Since that time he has had more difficulty with his bowels.  He takes Citrucel 1 tablespoon in the morning and MiraLAX every other night.  This seems to help.  No bleeding.  The patient had a myocardial infarction late January 2023.  He underwent cardiac catheterization.  He did not require intervention.  Aspirin was added to his Plavix.  He had cardiology office follow-up 2 weeks ago.  He is to follow-up with his cardiologist, Dr. Tamala Julian in 3 to 4 months.  Patient's GI review of systems is otherwise negative.  Review of blood work from February 14, 2021 shows normal creatinine.  Normal CBC with hemoglobin 15.3. CT angiography of the chest, abdomen, and pelvis performed February 05, 2021 revealed no significant abdominal abnormalities.  Incidental diverticulosis and prior cholecystectomy noted.  REVIEW OF SYSTEMS:  All non-GI ROS negative unless otherwise stated in the HPI except for arthritis  Past Medical History:  Diagnosis Date   Allergic rhinitis    Allergy    Anal fissure    Anxiety    from chronic pain from surgery- on Cymbalta   Arthritis    Asthma    Barrett's esophagus  03/29/2014   Carotid artery disease ([place])    Carotid Doppler normal August, 2007   Cataract    Chest pain    Coronaries normal 1996 /  nuclear..06/2002..normal...EF  56% /  stress echo.. May, 2011.... no  scar or ischemia... rate related RBBB   Diverticulosis    Dyslipidemia    Ejection fraction    EF 60%, stress echo, May, 2011   GERD (gastroesophageal reflux disease)    Headache    History of loop recorder    has since 04/04/15   HTN (hypertension)    takes Metoprolol for PVC control   Hx of colonic polyps    adenomatous   Hx of colonoscopy    Hyperlipidemia    IFG (impaired fasting glucose)    Palpitations    Benign PVCs   Parkinson disease ([place])    Prostate cancer (HCC)    RBBB (right bundle branch block)    rate related   Shingles    Stroke [facility])    TIA   TIA (transient ischemic attack) 02/2015   Per pt, had 2 strokes   Tremor    Hand tremor   Vertigo     Past Surgical History:  Procedure Laterality Date   BACK SURGERY  2002,2009   x 6   CHOLECYSTECTOMY  11/30/2012   with IOC   COLONOSCOPY     ELECTROPHYSIOLOGIC STUDY N/A 09/26/2015   Procedure: V Tach Ablation (PVC);  Surgeon: Will Meredith Leeds, MD;  Location: [place] [facility];  Service: Cardiovascular;  Laterality: N/A;   EP IMPLANTABLE DEVICE N/A 04/04/2015   Procedure: Loop Recorder Insertion;  Surgeon: Will Meredith Leeds, MD;  Location: [place] [facility];  Service: Cardiovascular;  Laterality: N/A;   HERNIA REPAIR     laprascopic   KNEE SURGERY     DECEMBER 2017,LEFT KNEE SCOPED   LEFT HEART CATH AND CORONARY ANGIOGRAPHY N/A 02/05/2021   Procedure: LEFT HEART CATH AND CORONARY ANGIOGRAPHY;  Surgeon: Wellington Hampshire, MD;  Location: [place] [facility];  Service: Cardiovascular;  Laterality: N/A;   NECK SURGERY  2002   POLYPECTOMY     ROTATOR CUFF REPAIR Left    TEE WITHOUT CARDIOVERSION N/A 04/04/2015   Procedure: TRANSESOPHAGEAL ECHOCARDIOGRAM (TEE);  Surgeon: Lelon Perla, MD;  Location: [facility]  ENDOSCOPY;  Service: Cardiovascular;  Laterality: N/A;   TRIGGER FINGER RELEASE Left 12/28/2019   Procedure: RELEASE TRIGGER FINGER/A-1 PULLEY THUMB, MIDDLE AND RING;  Surgeon: Daryll Brod, MD;  Location: [place];  Service: Orthopedics;  Laterality: Left;  FAB   UPPER GASTROINTESTINAL ENDOSCOPY     V Tach ablation  09/26/2015    Social History Dewaun Kinzler  reports that he has never smoked. He has never used smokeless tobacco. He reports that he does not drink alcohol and does not use drugs.  family history includes Breast cancer in his paternal grandmother; Clotting disorder in his father; Healthy in his daughter and daughter; Heart attack in his father and paternal grandfather; Heart disease in his brother and father; Hypertension in his mother; Lung cancer in his brother; Prostate cancer in his brother.  Allergies  Allergen Reactions   Floxin I.V. In Dextrose 5% [Ofloxacin] Other (See Comments)    Lowers BP Lowers BP   Terazosin Other (See Comments)       PHYSICAL EXAMINATION: Vital signs: BP 112/64    Pulse 88    Ht 6\' 1"  (1.854 m)    Wt 218 lb (98.9 kg)    SpO2 97%    BMI 28.76 kg/m   Constitutional: generally well-appearing, no acute distress Psychiatric: alert and oriented x3, cooperative Eyes: extraocular movements intact, anicteric, conjunctiva pink Mouth: oral pharynx moist, no lesions Neck: supple no lymphadenopathy Cardiovascular: heart regular rate and rhythm, no murmur Lungs: clear to auscultation bilaterally Abdomen: soft, nontender, nondistended, no obvious ascites, no peritoneal signs, normal bowel sounds, no organomegaly Rectal: Deferred until colonoscopy Extremities: no clubbing, cyanosis, or lower extremity edema bilaterally Skin: no lesions on visible extremities Neuro: No focal deficits.  Cranial nerves intact  ASSESSMENT:  1.  History of multiple advanced adenomatous colon polyps.  Last colonoscopy 2017.  Due for  surveillance 2.  History of Barrett's esophagus with high-grade dysplasia status post endoscopic ablation and active surveillance.  Being [facility] at [facility] 3.  GERD.  On Prevacid twice daily 4.  Multiple medical problems 5.  Recent MI 6.  Change in bowel habits secondary to medication  PLAN:  1.  Given the patient's recent MI, I would like to postpone plans for colonoscopy for 6 months.  He will have his cardiology checkup with Dr. Tamala Julian in 3 to 4 months.  We can obtain cardiology clearance for his colonoscopy at that time, if appropriate.  I would plan on doing his colonoscopy ON BOTH Plavix and aspirin.  We discussed the pros and cons of this strategy.  He agrees.  Patient is HIGH RISK given his comorbidities and antiplatelet therapies. 2.  Reflux precautions 3.  Continue pantoprazole  4.  Continue active surveillance at [facility] regarding history of Barrett's with high-grade dysplasia 5.  Ongoing general medical care with PCP and specialty care with specialist. 6.  Recommended increasing Citrucel 2 tablespoons daily

## 2021-03-04 NOTE — Progress Notes (Signed)
Assessment/Plan:   1.  Parkinsons Disease, possibly ET/PD  -Increase carbidopa/levodopa 25/100,2 tablets at 7 AM/2 tablets at 10 AM/2 at 1pm/2 at 4pm/2 at 7pm  -Continue carbidopa/levodopa 50/200 at bedtime  -May repeat levodopa challenge in the future, especially if we consider surgical interventions.  We did this several years ago, but at that point in time he only had tremor (and he has levodopa resistant tremor).  He has since developed more bradykinesia.  In addition, we may retry pramipexole   2.  History of watershed infarct  -Had his linq recorder explanted after it stopped working and never showed any issues  -On Plavix  3.  History of multiple syncopal episodes, likely due to [facility]  -Off of propranolol and no further syncope but still with lightheadedness/fatigue.  However, cardiology has added metoprolol ever since his NSTEMI.  I did send his cardiologist a message about this dilemma last night, especially since the NP had told him to only take the midodrine when his blood pressure dropped below 100.  Patient does state today that his primary care physician told him to take it daily and not as needed.  -continue midodrine 5 mg bid.  Patient back on this faithfully.  4.  Low back pain  -Has had surgical interventions in the past.  Has followed with Dr. Brien Few in the past.    5.  NSTEMI, 01/2020  -He was started on metoprolol at that time, although his Imdur was stopped because of hypotension.  -Hospital follow-up from this indicated patient was hypotensive.  Worry about the addition of metoprolol, as patient had had syncopal episodes on propranolol previously, but likely okay given the small dose and the fact he is back on midodrine.  Subjective:   Joshua [name] was seen today in follow up for Parkinsons disease.  My previous records were reviewed prior to todays visit as well as outside records available to me.  Wife with patient and supplements the hx. patient was  supposed to follow-up with me last month, but ended up in the hospital with NSTEMI.  Records reviewed.  He was hypotensive in the hospital.  Imdur was discontinued.  He was started on metoprolol.  He followed up with the cardiology NP on February 3 and reported that he just was not feeling well.  He felt nauseated and weak.  He noted that his blood pressures have been running low at home.  He noted that his midodrine had been stopped in the hospital.  Cardiology and he restarted it for "systolic blood pressure less than 100."  Pt states that he saw Dr. Brigitte Pulse since then and he told him to take the mididrine faithfully.  He is feeling much better since he started doing that.  Pt getting ready to start cardiac rehab but told it was a few months wait.    Current prescribed movement disorder medications: Carbidopa/levodopa 25/100, 2 tablets at 7 AM/2 tablets at 10 AM/2 at 1pm/1 at 4pm/1 at 7pm  Carbidopa/levodopa 50/200 at bedtime Midodrine 5 mg bid   PREVIOUS MEDICATIONS: Pramipexole (just was not helpful for tremor); primidone; propranolol; metoprolol  ALLERGIES:   Allergies  Allergen Reactions   Floxin I.V. In Dextrose 5% [Ofloxacin] Other (See Comments)    Lowers BP Lowers BP   Terazosin Other (See Comments)    CURRENT MEDICATIONS:  Outpatient Encounter Medications as of 03/05/2021  Medication Sig   acetaminophen (TYLENOL) 500 MG tablet Take 1,000 mg by mouth every 8 (eight) hours as needed  for mild pain.   aspirin 81 MG EC tablet Take 1 tablet (81 mg total) by mouth daily. Swallow whole.   Biotin 10000 MCG TABS Take 1,000 mcg by mouth daily.   carbidopa-levodopa (SINEMET CR) 50-200 MG tablet TAKE 1 TABLET BY MOUTH EVERYDAY AT BEDTIME (Patient taking differently: 1 tablet at bedtime.)   carbidopa-levodopa (SINEMET IR) 25-100 MG tablet 2 TABLETS AT 7 AM/2 TABLETS AT 10 AM/2 AT 1PM/1 AT 4PM/1 AT 7PM   cholecalciferol (VITAMIN D3) 25 MCG (1000 UNIT) tablet Take 1,000 Units by mouth daily.    clopidogrel (PLAVIX) 75 MG tablet Take 1 tablet (75 mg total) by mouth daily.   Cyanocobalamin (VITAMIN B 12 PO) Take 1,000 mcg by mouth daily.   DYMISTA 137-50 MCG/ACT SUSP Place 1 puff into both nostrils at bedtime.    escitalopram (LEXAPRO) 10 MG tablet Take 10 mg by mouth daily.   finasteride (PROSCAR) 5 MG tablet Take 5 mg by mouth Daily.   lansoprazole (PREVACID) 30 MG capsule Take 30 mg by mouth 2 (two) times daily.   levocetirizine (XYZAL) 5 MG tablet Take 5 mg by mouth every evening.     metoprolol succinate (TOPROL-XL) 25 MG 24 hr tablet Take 1/2 tablet (12.5 mg total) by mouth daily.   mexiletine (MEXITIL) 250 MG capsule Take 1 capsule (250 mg total) by mouth 2 (two) times daily.   midodrine (PROAMATINE) 5 MG tablet Take 1 tablet (5 mg total) by mouth 2 (two) times daily as needed. Take if systolic blood pressure gets below 100   montelukast (SINGULAIR) 10 MG tablet Take 10 mg by mouth at bedtime.     nitroGLYCERIN (NITROSTAT) 0.4 MG SL tablet Place 1 tablet (0.4 mg total) under the tongue every 5 (five) minutes as needed for chest pain (Do not give more than 3 SL tablets in 15 minutes.).   Omega-3 Fatty Acids (FISH OIL) 1000 MG CAPS Take 1,000 mg by mouth daily.   PROCTOSOL HC 2.5 % rectal cream APPLY INTO AND AROUND RECTUM 2 TIMES A DAY (Patient taking differently: Place 1 application rectally 2 (two) times daily as needed for hemorrhoids or anal itching.)   rosuvastatin (CRESTOR) 20 MG tablet Take 1 tablet (20 mg total) by mouth at bedtime.   tamsulosin (FLOMAX) 0.4 MG CAPS capsule Take 0.4 mg by mouth daily after supper.    traMADol (ULTRAM) 50 MG tablet Take 50 mg by mouth every 6 (six) hours as needed. for pain   zolpidem (AMBIEN) 10 MG tablet Take 10 mg by mouth at bedtime.   No facility-administered encounter medications on file as of 03/05/2021.    Objective:   PHYSICAL EXAMINATION:    VITALS:   Vitals:   03/05/21 0823  BP: 126/73  Pulse: 73  SpO2: 96%  Weight: 219  lb 6.4 oz (99.5 kg)  Height: 6' (1.829 m)      GEN:  The patient appears stated age and is in NAD. HEENT:  Normocephalic, atraumatic.  The mucous membranes are moist. The superficial temporal arteries are without ropiness or tenderness. CV:  RRR Lungs:  CTAB Neck/HEME:  There are no carotid bruits bilaterally.  Neurological examination:  Orientation: The patient is alert and oriented x3. Cranial nerves: There is good facial symmetry with facial hypomimia. The speech is fluent and clear. Soft palate rises symmetrically and there is no tongue deviation. Hearing is intact to conversational tone. Sensation: Sensation is intact to light touch throughout Motor: Strength is at least antigravity x4.  Movement  examination: Tone: There is mild to mod increased tone in the RLE Abnormal movements: there is RUE>LUE rest tremor Coordination:  There is decremation, with any form of RAMS, including alternating supination and pronation of the forearm, hand opening and closing, finger taps, heel taps and toe taps, mostly on the right Gait and Station: The patient has no difficulty arising out of a deep-seated chair without the use of the hands. The patient is forward flexed with decreased arm swing  I have reviewed and interpreted the following labs independently    Chemistry      Component Value Date/Time   NA 136 02/14/2021 1528   K 4.1 02/14/2021 1528   CL 100 02/14/2021 1528   CO2 18 (L) 02/14/2021 1528   BUN 20 02/14/2021 1528   CREATININE 1.07 02/14/2021 1528   CREATININE 0.88 09/18/2015 1135      Component Value Date/Time   CALCIUM 9.6 02/14/2021 1528   ALKPHOS 58 02/05/2021 1147   AST 23 02/05/2021 1147   ALT 11 02/05/2021 1147   BILITOT 1.0 02/05/2021 1147   BILITOT 0.7 04/04/2015 1501       Lab Results  Component Value Date   WBC 7.9 02/14/2021   HGB 15.3 02/14/2021   HCT 44.3 02/14/2021   MCV 92 02/14/2021   PLT 262 02/14/2021    Lab Results  Component Value Date    TSH 1.240 04/04/2015     Total time spent on today's visit was 30 minutes, including both face-to-face time and nonface-to-face time.  Time included that spent on review of records (prior notes available to me/labs/imaging if pertinent), discussing treatment and goals, answering patient's questions and coordinating care.  Cc:  Ginger Organ., MD

## 2021-03-05 ENCOUNTER — Other Ambulatory Visit: Payer: Self-pay

## 2021-03-05 ENCOUNTER — Encounter: Payer: Self-pay | Admitting: Neurology

## 2021-03-05 ENCOUNTER — Ambulatory Visit (INDEPENDENT_AMBULATORY_CARE_PROVIDER_SITE_OTHER): Payer: Medicare HMO | Admitting: Neurology

## 2021-03-05 VITALS — BP 126/73 | HR 73 | Ht 72.0 in | Wt 219.4 lb

## 2021-03-05 DIAGNOSIS — I214 Non-ST elevation (NSTEMI) myocardial infarction: Secondary | ICD-10-CM

## 2021-03-05 DIAGNOSIS — G903 Multi-system degeneration of the autonomic nervous system: Secondary | ICD-10-CM

## 2021-03-05 DIAGNOSIS — G20 Parkinson's disease: Secondary | ICD-10-CM

## 2021-03-05 MED ORDER — CARBIDOPA-LEVODOPA 25-100 MG PO TABS: ORAL_TABLET | ORAL | 1 refills | Status: DC

## 2021-03-05 NOTE — Patient Instructions (Addendum)
Increase carbidopa/levodopa 25/100, 2 tablets at 7 AM/2 tablets at 10 AM/2 at 1pm/2 at 4pm/2 at 7pm 2.  Continue carbidopa/levodopa 50/200 at bedtime

## 2021-03-10 ENCOUNTER — Other Ambulatory Visit (HOSPITAL_COMMUNITY): Payer: Self-pay

## 2021-03-21 ENCOUNTER — Telehealth (HOSPITAL_COMMUNITY): Payer: Self-pay

## 2021-03-21 NOTE — Telephone Encounter (Signed)
Called patient to see if he was interested in participating in the Cardiac Rehab Program. Patient stated yes. Patient will come in for orientation on 4/4 @ 1:15PM and will attend the 3PM exercise class. ?  ?Tourist information centre manager.  ?

## 2021-03-27 ENCOUNTER — Other Ambulatory Visit (HOSPITAL_COMMUNITY): Payer: Self-pay

## 2021-03-30 ENCOUNTER — Other Ambulatory Visit: Payer: Self-pay | Admitting: Cardiology

## 2021-03-31 NOTE — Telephone Encounter (Signed)
Rx(s) sent to pharmacy electronically.  

## 2021-04-15 ENCOUNTER — Encounter (HOSPITAL_COMMUNITY): Payer: Self-pay

## 2021-04-15 ENCOUNTER — Encounter (HOSPITAL_COMMUNITY)
Admission: RE | Admit: 2021-04-15 | Discharge: 2021-04-15 | Disposition: A | Discharge status: Home / Self Care | Payer: Medicare HMO | Source: Ambulatory Visit | Attending: Cardiology | Admitting: Cardiology

## 2021-04-15 VITALS — BP 110/64 | HR 76 | Ht 72.25 in | Wt 214.1 lb

## 2021-04-15 DIAGNOSIS — I214 Non-ST elevation (NSTEMI) myocardial infarction: Secondary | ICD-10-CM | POA: Insufficient documentation

## 2021-04-15 NOTE — Progress Notes (Signed)
Cardiac Rehab Medication Review  ? ?Does the patient  feel that his/her medications are working for him/her?  yes ? ?Has the patient been experiencing any side effects to the medications prescribed?  no ? ?Does the patient measure his/her own blood pressure or blood glucose at home?  yes  ? ?Does the patient have any problems obtaining medications due to transportation or finances?   no ? ?Understanding of regimen: excellent ?Understanding of indications: excellent ?Potential of compliance: excellent ? ? ?Joshua [name] Ruben Im RN ?04/15/2021 3:13 PM ?  ?

## 2021-04-15 NOTE — Progress Notes (Signed)
Cardiac Individual Treatment Plan ? ?Patient Details  ?Name: Joshua [name] ?MRN: [id number] ?Date of Birth: 1945-02-19 ?Referring Provider:   ?Flowsheet Row CARDIAC REHAB PHASE II ORIENTATION from 04/15/2021 in [place]  ?Referring Provider Constance Haw, MD  ? ?  ? ? ?Initial Encounter Date:  ?Flowsheet Row CARDIAC REHAB PHASE II ORIENTATION from 04/15/2021 in [place]  ?Date 04/15/21  ? ?  ? ? ?Visit Diagnosis: 02/05/21 NSTEMI ? ?Patient's Home Medications on Admission: ? ?Current Outpatient Medications:  ?  acetaminophen (TYLENOL) 500 MG tablet, Take 1,000 mg by mouth in the morning and at bedtime., Disp: , Rfl:  ?  aspirin 81 MG EC tablet, Take 1 tablet (81 mg total) by mouth daily. Swallow whole., Disp: 30 tablet, Rfl: 11 ?  Biotin 10000 MCG TABS, Take 1,000 mcg by mouth in the morning., Disp: , Rfl:  ?  carbidopa-levodopa (SINEMET CR) 50-200 MG tablet, TAKE 1 TABLET BY MOUTH EVERYDAY AT BEDTIME (Patient taking differently: 1 tablet at bedtime.), Disp: 90 tablet, Rfl: 1 ?  carbidopa-levodopa (SINEMET IR) 25-100 MG tablet, 2 tablets at 7 AM/2 tablets at 10 AM/2 at 1pm/2 at 4pm/2 at 7pm (Patient taking differently: Take 2 tablets by mouth 6 (six) times daily. 2 tablets at 7 AM/2 tablets at 10 AM/2 at 1pm/2 at 4pm/2 at 6pm/2 tablets at 9p), Disp: 900 tablet, Rfl: 1 ?  cholecalciferol (VITAMIN D3) 25 MCG (1000 UNIT) tablet, Take 1,000 Units by mouth daily., Disp: , Rfl:  ?  clopidogrel (PLAVIX) 75 MG tablet, Take 1 tablet (75 mg total) by mouth daily., Disp: 30 tablet, Rfl: 2 ?  Cyanocobalamin (VITAMIN B 12 PO), Take 1,000 mcg by mouth daily., Disp: , Rfl:  ?  DYMISTA 137-50 MCG/ACT SUSP, Place 1 puff into both nostrils at bedtime. , Disp: , Rfl:  ?  escitalopram (LEXAPRO) 10 MG tablet, Take 10 mg by mouth daily., Disp: , Rfl:  ?  finasteride (PROSCAR) 5 MG tablet, Take 5 mg by mouth every evening., Disp: , Rfl:  ?  lansoprazole (PREVACID)  30 MG capsule, Take 30 mg by mouth 2 (two) times daily. Morning & mid-afternoon, Disp: , Rfl:  ?  levocetirizine (XYZAL) 5 MG tablet, Take 5 mg by mouth every evening.  , Disp: , Rfl:  ?  metoprolol succinate (TOPROL-XL) 25 MG 24 hr tablet, Take 1/2 tablet (12.5 mg total) by mouth daily., Disp: 30 tablet, Rfl: 2 ?  mexiletine (MEXITIL) 250 MG capsule, TAKE 1 CAPSULE BY MOUTH 2 TIMES DAILY., Disp: 180 capsule, Rfl: 2 ?  midodrine (PROAMATINE) 5 MG tablet, Take 1 tablet (5 mg total) by mouth 2 (two) times daily as needed. Take if systolic blood pressure gets below 100 (Patient taking differently: Take 5 mg by mouth in the morning.), Disp: 60 tablet, Rfl: 3 ?  montelukast (SINGULAIR) 10 MG tablet, Take 10 mg by mouth at bedtime.  , Disp: , Rfl:  ?  nitroGLYCERIN (NITROSTAT) 0.4 MG SL tablet, Place 1 tablet (0.4 mg total) under the tongue every 5 (five) minutes as needed for chest pain (Do not give more than 3 SL tablets in 15 minutes.)., Disp: 25 tablet, Rfl: 2 ?  Omega-3 Fatty Acids (FISH OIL) 1000 MG CAPS, Take 1,000 mg by mouth in the morning., Disp: , Rfl:  ?  PROCTOSOL HC 2.5 % rectal cream, APPLY INTO AND AROUND RECTUM 2 TIMES A DAY (Patient taking differently: Place 1 application. rectally 2 (two) times  daily as needed for hemorrhoids or anal itching.), Disp: 28.35 g, Rfl: 0 ?  rosuvastatin (CRESTOR) 20 MG tablet, Take 1 tablet (20 mg total) by mouth at bedtime., Disp: 90 tablet, Rfl: 0 ?  tamsulosin (FLOMAX) 0.4 MG CAPS capsule, Take 0.4 mg by mouth daily after supper. , Disp: , Rfl:  ?  traMADol (ULTRAM) 50 MG tablet, Take 50 mg by mouth every 6 (six) hours as needed. for pain, Disp: , Rfl: 0 ?  zolpidem (AMBIEN) 10 MG tablet, Take 10 mg by mouth at bedtime., Disp: , Rfl:  ? ?Past Medical History: ?Past Medical History:  ?Diagnosis Date  ? Allergic rhinitis   ? Allergy   ? Anal fissure   ? Anxiety   ? from chronic pain from surgery- on Cymbalta  ? Arthritis   ? Asthma   ? Barrett's esophagus 03/29/2014  ?  Carotid artery disease ([place])   ? Carotid Doppler normal August, 2007  ? Cataract   ? Chest pain   ? Coronaries normal 1996 /  nuclear..06/2002..normal...EF  56% /  stress echo.. May, 2011.... no  scar or ischemia... rate related RBBB  ? Diverticulosis   ? Dyslipidemia   ? Ejection fraction   ? EF 60%, stress echo, May, 2011  ? GERD (gastroesophageal reflux disease)   ? Headache   ? History of loop recorder   ? has since 04/04/15  ? HTN (hypertension)   ? takes Metoprolol for PVC control  ? Hx of colonic polyps   ? adenomatous  ? Hx of colonoscopy   ? Hyperlipidemia   ? IFG (impaired fasting glucose)   ? Palpitations   ? Benign PVCs  ? Parkinson disease ([place])   ? Prostate cancer ([place])   ? RBBB (right bundle branch block)   ? rate related  ? Shingles   ? Stroke [facility])   ? TIA  ? TIA (transient ischemic attack) 02/2015  ? Per pt, had 2 strokes  ? Tremor   ? Hand tremor  ? Vertigo   ? ? ?Tobacco Use: ?Social History  ? ?Tobacco Use  ?Smoking Status Never  ?Smokeless Tobacco Never  ? ? ?Labs: ?Review Flowsheet   ? ?  ?  Latest Ref Rng & Units 03/05/2015 03/06/2015 02/06/2021 02/07/2021  ?Labs for ITP Cardiac and Pulmonary Rehab  ?Cholestrol 0 - 200 mg/dL  137   133   123    ?LDL (calc) 0 - 99 mg/dL  75   64   56    ?HDL-C >40 mg/dL  41   49   46    ?Trlycerides <150 mg/dL  105   102   103    ?Hemoglobin A1c 4.8 - 5.6 %  5.7   5.7   5.3    ?TCO2 0 - 100 mmol/L 23       ?  ? ? Multiple values from one day are sorted in reverse-chronological order  ?  ?  ? ? ?Capillary Blood Glucose: ?Lab Results  ?Component Value Date  ? GLUCAP 115 (H) 03/05/2015  ? ? ? ?Exercise Target Goals: ?Exercise Program Goal: ?Individual exercise prescription set using results from initial 6 min walk test and THRR while considering  patient?s activity barriers and safety.  ? ?Exercise Prescription Goal: ?Initial exercise prescription builds to 30-45 minutes a day of aerobic activity, 2-3 days per week.  Home exercise guidelines will be given to patient  during program as part of exercise prescription that the participant will acknowledge. ? ?  Activity Barriers & Risk Stratification: ? Activity Barriers & Cardiac Risk Stratification - 04/15/21 1304   ? ?  ? Activity Barriers & Cardiac Risk Stratification  ? Activity Barriers Assistive Device;Back Problems;History of Falls;Balance Concerns;Other (comment)   ? Comments Parkinsons-tremors, CVA x2- bilateral leg weakness, right hip numbness   ? Cardiac Risk Stratification High   ? ?  ?  ? ?  ? ? ?6 Minute Walk: ? 6 Minute Walk   ? ? [place] Name 04/15/21 1354  ?  ?  ?  ? 6 Minute Walk  ? Phase Initial    ? Distance 1132 feet    ? Walk Time 6 minutes    ? # of Rest Breaks 0    ? MPH 2.14    ? METS 2.5    ? RPE 11    ? Perceived Dyspnea  0.5    ? VO2 Peak 8.77    ? Symptoms Yes (comment)    ? Comments Patient c/o right hip pain (chronic), "6/10" on the pain scale, mild SOB, RPD= 0.5.    ? Resting HR 76 bpm    ? Resting BP 110/64    ? Resting Oxygen Saturation  97 %    ? Exercise Oxygen Saturation  during 6 min walk 96 %    ? Max Ex. HR 107 bpm    ? Max Ex. BP 138/66    ? 2 Minute Post BP 118/76    ? ?  ?  ? ?  ? ? ?Oxygen Initial Assessment: ? ? ?Oxygen Re-Evaluation: ? ? ?Oxygen Discharge (Final Oxygen Re-Evaluation): ? ? ?Initial Exercise Prescription: ? Initial Exercise Prescription - 04/15/21 1500   ? ?  ? Date of Initial Exercise RX and Referring Provider  ? Date 04/15/21   ? Referring Provider Constance Haw, MD   ? Expected Discharge Date 06/13/21   ?  ? Recumbant Bike  ? Level 1.5   ? Minutes 15   ? METs 2.4   ?  ? NuStep  ? Level 2   ? SPM 85   ? Minutes 15   ? METs 2.5   ?  ? Prescription Details  ? Frequency (times per week) 3   ? Duration Progress to 30 minutes of continuous aerobic without signs/symptoms of physical distress   ?  ? Intensity  ? THRR 40-80% of Max Heartrate 58-117   ? Ratings of Perceived Exertion 11-13   ? Perceived Dyspnea 0-4   ?  ? Progression  ? Progression Continue to progress workloads  to maintain intensity without signs/symptoms of physical distress.   ?  ? Resistance Training  ? Training Prescription Yes   ? Weight 5 lbs   ? Reps 10-15   ? ?  ?  ? ?  ? ? ?Perform Capillary Blood Gluco

## 2021-04-21 ENCOUNTER — Encounter (HOSPITAL_COMMUNITY)
Admission: RE | Admit: 2021-04-21 | Discharge: 2021-04-21 | Disposition: A | Discharge status: Home / Self Care | Payer: Medicare HMO | Source: Ambulatory Visit | Attending: Cardiology | Admitting: Cardiology

## 2021-04-21 DIAGNOSIS — I214 Non-ST elevation (NSTEMI) myocardial infarction: Secondary | ICD-10-CM

## 2021-04-21 NOTE — Progress Notes (Signed)
Daily Session Note ? ?Patient Details  ?Name: Joshua [name] ?MRN: [id number] ?Date of Birth: 27-Dec-1945 ?Referring Provider:   ?Flowsheet Row CARDIAC REHAB PHASE II ORIENTATION from 04/15/2021 in [place]  ?Referring Provider Joshua Haw, MD  ? ?  ? ? ?Encounter Date: 04/21/2021 ? ?Check In: ? Session Check In - 04/21/21 1547   ? ?  ? Check-In  ? Supervising physician immediately available to respond to emergencies [facility] immediately available   ? Physician(s) Dr. Maryland Pink   ? Location [facility]   ? Staff Present Barnet Pall, RN, Deland Pretty, MS, ACSM CEP, Exercise Physiologist;David Ellerslie, MS, ACSM-CEP, CCRP, Exercise Physiologist   ? Virtual Visit No   ? Medication changes reported     No   ? Fall or balance concerns reported    No   ? Tobacco Cessation No Change   ? Warm-up and Cool-down Performed as group-led instruction   ? Resistance Training Performed Yes   ? VAD Patient? No   ? PAD/SET Patient? No   ?  ? Pain Assessment  ? Currently in Pain? No/denies   ? Pain Score 0-No pain   ? Multiple Pain Sites No   ? ?  ?  ? ?  ? ? ?Capillary Blood Glucose: ?No results found for this or any previous visit (from the past 24 hour(s)). ? ? Exercise Prescription Changes - 04/21/21 1456   ? ?  ? Response to Exercise  ? Blood Pressure (Admit) 142/68   ? Blood Pressure (Exercise) 122/78   ? Blood Pressure (Exit) 116/60   ? Heart Rate (Admit) 84 bpm   ? Heart Rate (Exercise) 100 bpm   ? Heart Rate (Exit) 84 bpm   ? Rating of Perceived Exertion (Exercise) 11   ? Symptoms None   ? Comments Off to a good start with exercise.   ? Duration Continue with 30 min of aerobic exercise without signs/symptoms of physical distress.   ? Intensity THRR unchanged   ?  ? Progression  ? Progression Continue to progress workloads to maintain intensity without signs/symptoms of physical distress.   ? Average METs 2.3   ?  ? Resistance Training  ? Training  Prescription Yes   ? Weight 5 lbs   ? Reps 10-15   ? Time 10 Minutes   ?  ? Interval Training  ? Interval Training No   ?  ? Recumbant Bike  ? Level 1.5   ? Minutes 15   ? METs 2.4   ?  ? NuStep  ? Level 2   ? SPM 84   ? Minutes 15   ? METs 2.1   ? ?  ?  ? ?  ? ? ?Social History  ? ?Tobacco Use  ?Smoking Status Never  ?Smokeless Tobacco Never  ? ? ?Goals Met:  ?Exercise tolerated well ?No report of concerns or symptoms today ?Strength training completed today ? ?Goals Unmet:  ?Not Applicable ? ?Comments: Joshua [name] started cardiac rehab today.  Pt tolerated light exercise without difficulty.  Joshua [name] uses a walking stick for stability. VSS, telemetry-Sinus Rhythm, asymptomatic.  Medication list reconciled. Pt denies barriers to medicaiton compliance.  PSYCHOSOCIAL ASSESSMENT:  PHQ-0. Pt exhibits positive coping skills, hopeful outlook with supportive family. No psychosocial needs identified at this time, no psychosocial interventions necessary.    Pt enjoys watches his grand kids activities and staying active .   Pt oriented to exercise equipment and routine.  Understanding verbalized.  ? ? ?Dr. Fransico Him is Medical Director for Cardiac Rehab at [facility]. ?

## 2021-04-21 NOTE — Progress Notes (Signed)
Cardiac Individual Treatment Plan ? ?Patient Details  ?Name: Joshua [name] ?MRN: [id number] ?Date of Birth: [birth year] ?Referring Provider:   ?Flowsheet Row CARDIAC REHAB PHASE II ORIENTATION from 04/15/2021 in [place]  ?Referring Provider Constance Haw, MD  ? ?  ? ? ?Initial Encounter Date:  ?Flowsheet Row CARDIAC REHAB PHASE II ORIENTATION from 04/15/2021 in [place]  ?Date 04/15/21  ? ?  ? ? ?Visit Diagnosis: 02/05/21 NSTEMI ? ?Patient's Home Medications on Admission: ? ?Current Outpatient Medications:  ?  acetaminophen (TYLENOL) 500 MG tablet, Take 1,000 mg by mouth in the morning and at bedtime., Disp: , Rfl:  ?  aspirin 81 MG EC tablet, Take 1 tablet (81 mg total) by mouth daily. Swallow whole., Disp: 30 tablet, Rfl: 11 ?  Biotin 10000 MCG TABS, Take 1,000 mcg by mouth in the morning., Disp: , Rfl:  ?  carbidopa-levodopa (SINEMET CR) 50-200 MG tablet, TAKE 1 TABLET BY MOUTH EVERYDAY AT BEDTIME (Patient taking differently: 1 tablet at bedtime.), Disp: 90 tablet, Rfl: 1 ?  carbidopa-levodopa (SINEMET IR) 25-100 MG tablet, 2 tablets at 7 AM/2 tablets at 10 AM/2 at 1pm/2 at 4pm/2 at 7pm (Patient taking differently: Take 2 tablets by mouth 6 (six) times daily. 2 tablets at 7 AM/2 tablets at 10 AM/2 at 1pm/2 at 4pm/2 at 6pm/2 tablets at 9p), Disp: 900 tablet, Rfl: 1 ?  cholecalciferol (VITAMIN D3) 25 MCG (1000 UNIT) tablet, Take 1,000 Units by mouth daily., Disp: , Rfl:  ?  clopidogrel (PLAVIX) 75 MG tablet, Take 1 tablet (75 mg total) by mouth daily., Disp: 30 tablet, Rfl: 2 ?  Cyanocobalamin (VITAMIN B 12 PO), Take 1,000 mcg by mouth daily., Disp: , Rfl:  ?  DYMISTA 137-50 MCG/ACT SUSP, Place 1 puff into both nostrils at bedtime. , Disp: , Rfl:  ?  escitalopram (LEXAPRO) 10 MG tablet, Take 10 mg by mouth daily., Disp: , Rfl:  ?  finasteride (PROSCAR) 5 MG tablet, Take 5 mg by mouth every evening., Disp: , Rfl:  ?  lansoprazole (PREVACID)  30 MG capsule, Take 30 mg by mouth 2 (two) times daily. Morning & mid-afternoon, Disp: , Rfl:  ?  levocetirizine (XYZAL) 5 MG tablet, Take 5 mg by mouth every evening.  , Disp: , Rfl:  ?  metoprolol succinate (TOPROL-XL) 25 MG 24 hr tablet, Take 1/2 tablet (12.5 mg total) by mouth daily., Disp: 30 tablet, Rfl: 2 ?  mexiletine (MEXITIL) 250 MG capsule, TAKE 1 CAPSULE BY MOUTH 2 TIMES DAILY., Disp: 180 capsule, Rfl: 2 ?  midodrine (PROAMATINE) 5 MG tablet, Take 1 tablet (5 mg total) by mouth 2 (two) times daily as needed. Take if systolic blood pressure gets below 100 (Patient taking differently: Take 5 mg by mouth in the morning.), Disp: 60 tablet, Rfl: 3 ?  montelukast (SINGULAIR) 10 MG tablet, Take 10 mg by mouth at bedtime.  , Disp: , Rfl:  ?  nitroGLYCERIN (NITROSTAT) 0.4 MG SL tablet, Place 1 tablet (0.4 mg total) under the tongue every 5 (five) minutes as needed for chest pain (Do not give more than 3 SL tablets in 15 minutes.)., Disp: 25 tablet, Rfl: 2 ?  Omega-3 Fatty Acids (FISH OIL) 1000 MG CAPS, Take 1,000 mg by mouth in the morning., Disp: , Rfl:  ?  PROCTOSOL HC 2.5 % rectal cream, APPLY INTO AND AROUND RECTUM 2 TIMES A DAY (Patient taking differently: Place 1 application. rectally 2 (two) times  daily as needed for hemorrhoids or anal itching.), Disp: 28.35 g, Rfl: 0 ?  rosuvastatin (CRESTOR) 20 MG tablet, Take 1 tablet (20 mg total) by mouth at bedtime., Disp: 90 tablet, Rfl: 0 ?  tamsulosin (FLOMAX) 0.4 MG CAPS capsule, Take 0.4 mg by mouth daily after supper. , Disp: , Rfl:  ?  traMADol (ULTRAM) 50 MG tablet, Take 50 mg by mouth every 6 (six) hours as needed. for pain, Disp: , Rfl: 0 ?  zolpidem (AMBIEN) 10 MG tablet, Take 10 mg by mouth at bedtime., Disp: , Rfl:  ? ?Past Medical History: ?Past Medical History:  ?Diagnosis Date  ? Allergic rhinitis   ? Allergy   ? Anal fissure   ? Anxiety   ? from chronic pain from surgery- on Cymbalta  ? Arthritis   ? Asthma   ? Barrett's esophagus 03/29/2014  ?  Carotid artery disease ([place])   ? Carotid Doppler normal August, 2007  ? Cataract   ? Chest pain   ? Coronaries normal 1996 /  nuclear..06/2002..normal...EF  56% /  stress echo.. May, 2011.... no  scar or ischemia... rate related RBBB  ? Diverticulosis   ? Dyslipidemia   ? Ejection fraction   ? EF 60%, stress echo, May, 2011  ? GERD (gastroesophageal reflux disease)   ? Headache   ? History of loop recorder   ? has since 04/04/15  ? HTN (hypertension)   ? takes Metoprolol for PVC control  ? Hx of colonic polyps   ? adenomatous  ? Hx of colonoscopy   ? Hyperlipidemia   ? IFG (impaired fasting glucose)   ? Palpitations   ? Benign PVCs  ? Parkinson disease ([place])   ? Prostate cancer ([place])   ? RBBB (right bundle branch block)   ? rate related  ? Shingles   ? Stroke [facility])   ? TIA  ? TIA (transient ischemic attack) 02/2015  ? Per pt, had 2 strokes  ? Tremor   ? Hand tremor  ? Vertigo   ? ? ?Tobacco Use: ?Social History  ? ?Tobacco Use  ?Smoking Status Never  ?Smokeless Tobacco Never  ? ? ?Labs: ?Review Flowsheet   ? ?  ?  Latest Ref Rng & Units 03/05/2015 03/06/2015 02/06/2021 02/07/2021  ?Labs for ITP Cardiac and Pulmonary Rehab  ?Cholestrol 0 - 200 mg/dL  137   133   123    ?LDL (calc) 0 - 99 mg/dL  75   64   56    ?HDL-C >40 mg/dL  41   49   46    ?Trlycerides <150 mg/dL  105   102   103    ?Hemoglobin A1c 4.8 - 5.6 %  5.7   5.7   5.3    ?TCO2 0 - 100 mmol/L 23       ?  ? ? Multiple values from one day are sorted in reverse-chronological order  ?  ?  ? ? ?Capillary Blood Glucose: ?Lab Results  ?Component Value Date  ? GLUCAP 115 (H) 03/05/2015  ? ? ? ?Exercise Target Goals: ?Exercise Program Goal: ?Individual exercise prescription set using results from initial 6 min walk test and THRR while considering  patient?s activity barriers and safety.  ? ?Exercise Prescription Goal: ?Starting with aerobic activity 30 plus minutes a day, 3 days per week for initial exercise prescription. Provide home exercise prescription and  guidelines that participant acknowledges understanding prior to discharge. ? ?Activity Barriers & Risk Stratification: ?  Activity Barriers & Cardiac Risk Stratification - 04/15/21 1304   ? ?  ? Activity Barriers & Cardiac Risk Stratification  ? Activity Barriers Assistive Device;Back Problems;History of Falls;Balance Concerns;Other (comment)   ? Comments Parkinsons-tremors, CVA x2- bilateral leg weakness, right hip numbness   ? Cardiac Risk Stratification High   ? ?  ?  ? ?  ? ? ?6 Minute Walk: ? 6 Minute Walk   ? ? [place] Name 04/15/21 1354  ?  ?  ?  ? 6 Minute Walk  ? Phase Initial    ? Distance 1132 feet    ? Walk Time 6 minutes    ? # of Rest Breaks 0    ? MPH 2.14    ? METS 2.5    ? RPE 11    ? Perceived Dyspnea  0.5    ? VO2 Peak 8.77    ? Symptoms Yes (comment)    ? Comments Patient c/o right hip pain (chronic), "6/10" on the pain scale, mild SOB, RPD= 0.5.    ? Resting HR 76 bpm    ? Resting BP 110/64    ? Resting Oxygen Saturation  97 %    ? Exercise Oxygen Saturation  during 6 min walk 96 %    ? Max Ex. HR 107 bpm    ? Max Ex. BP 138/66    ? 2 Minute Post BP 118/76    ? ?  ?  ? ?  ? ? ?Oxygen Initial Assessment: ? ? ?Oxygen Re-Evaluation: ? ? ?Oxygen Discharge (Final Oxygen Re-Evaluation): ? ? ?Initial Exercise Prescription: ? Initial Exercise Prescription - 04/15/21 1500   ? ?  ? Date of Initial Exercise RX and Referring Provider  ? Date 04/15/21   ? Referring Provider Constance Haw, MD   ? Expected Discharge Date 06/13/21   ?  ? Recumbant Bike  ? Level 1.5   ? Minutes 15   ? METs 2.4   ?  ? NuStep  ? Level 2   ? SPM 85   ? Minutes 15   ? METs 2.5   ?  ? Prescription Details  ? Frequency (times per week) 3   ? Duration Progress to 30 minutes of continuous aerobic without signs/symptoms of physical distress   ?  ? Intensity  ? THRR 40-80% of Max Heartrate 58-117   ? Ratings of Perceived Exertion 11-13   ? Perceived Dyspnea 0-4   ?  ? Progression  ? Progression Continue to progress workloads to maintain  intensity without signs/symptoms of physical distress.   ?  ? Resistance Training  ? Training Prescription Yes   ? Weight 5 lbs   ? Reps 10-15   ? ?  ?  ? ?  ? ? ?Perform Capillary Blood Glucose checks as needed

## 2021-04-23 ENCOUNTER — Encounter (HOSPITAL_COMMUNITY): Payer: Medicare HMO

## 2021-04-23 ENCOUNTER — Encounter: Payer: Self-pay | Admitting: Cardiology

## 2021-04-25 ENCOUNTER — Telehealth: Payer: Self-pay

## 2021-04-25 ENCOUNTER — Encounter (HOSPITAL_COMMUNITY)
Admission: RE | Admit: 2021-04-25 | Discharge: 2021-04-25 | Disposition: A | Discharge status: Home / Self Care | Payer: Medicare HMO | Source: Ambulatory Visit | Attending: Cardiology | Admitting: Cardiology

## 2021-04-25 DIAGNOSIS — I214 Non-ST elevation (NSTEMI) myocardial infarction: Secondary | ICD-10-CM

## 2021-04-25 MED ORDER — MEXILETINE HCL 150 MG PO CAPS: 150.0000 mg | ORAL_CAPSULE | Freq: Two times a day (BID) | ORAL | 1 refills | Status: DC

## 2021-04-25 NOTE — Telephone Encounter (Signed)
Patient completing his cardiac rehab exercise session today. Asked by Cardiac Rehab RN to facilitate new prescription for mexiletine '150mg'$  as ordered by Dr. Curt Bears secondary to GI upset at higher dosage. Mexiletine '150mg'$  BID sent to patient's pharmacy of choice. Patient notified.  ?

## 2021-04-28 ENCOUNTER — Encounter (HOSPITAL_COMMUNITY)
Admission: RE | Admit: 2021-04-28 | Discharge: 2021-04-28 | Disposition: A | Discharge status: Home / Self Care | Payer: Medicare HMO | Source: Ambulatory Visit | Attending: Cardiology | Admitting: Cardiology

## 2021-04-28 DIAGNOSIS — I214 Non-ST elevation (NSTEMI) myocardial infarction: Secondary | ICD-10-CM | POA: Diagnosis not present

## 2021-04-30 ENCOUNTER — Encounter (HOSPITAL_COMMUNITY)
Admission: RE | Admit: 2021-04-30 | Discharge: 2021-04-30 | Disposition: A | Discharge status: Home / Self Care | Payer: Medicare HMO | Source: Ambulatory Visit | Attending: Cardiology | Admitting: Cardiology

## 2021-04-30 DIAGNOSIS — I214 Non-ST elevation (NSTEMI) myocardial infarction: Secondary | ICD-10-CM | POA: Diagnosis not present

## 2021-05-02 ENCOUNTER — Encounter (HOSPITAL_COMMUNITY)
Admission: RE | Admit: 2021-05-02 | Discharge: 2021-05-02 | Disposition: A | Discharge status: Home / Self Care | Payer: Medicare HMO | Source: Ambulatory Visit | Attending: Cardiology | Admitting: Cardiology

## 2021-05-02 DIAGNOSIS — I214 Non-ST elevation (NSTEMI) myocardial infarction: Secondary | ICD-10-CM | POA: Diagnosis not present

## 2021-05-05 ENCOUNTER — Encounter (HOSPITAL_COMMUNITY): Payer: Medicare HMO

## 2021-05-07 ENCOUNTER — Encounter (HOSPITAL_COMMUNITY): Payer: Medicare HMO

## 2021-05-09 ENCOUNTER — Encounter (HOSPITAL_COMMUNITY): Payer: Medicare HMO

## 2021-05-12 ENCOUNTER — Encounter (HOSPITAL_COMMUNITY): Payer: Medicare HMO

## 2021-05-12 ENCOUNTER — Telehealth (HOSPITAL_COMMUNITY): Payer: Self-pay

## 2021-05-12 NOTE — Telephone Encounter (Signed)
Pt called and stated he would have to end his CR at this time. Due to his back issues, he will be in contact with his doctor about his back issues. But right now feel he is unable to continue CR. ?

## 2021-05-13 NOTE — Progress Notes (Signed)
Joshua [name] attended 5 exercise sessions between 04/21/21-05/02/21. Joshua [name] did well with exercise when in attendance. Joshua [name] stopped participation in the program due to back issues and asked to be discharged from cardiac rehab.Harrell Gave RN BSN  ?

## 2021-05-14 ENCOUNTER — Encounter (HOSPITAL_COMMUNITY): Payer: Medicare HMO

## 2021-05-16 ENCOUNTER — Encounter (HOSPITAL_COMMUNITY): Payer: Medicare HMO

## 2021-05-19 ENCOUNTER — Encounter (HOSPITAL_COMMUNITY): Payer: Medicare HMO

## 2021-05-21 ENCOUNTER — Encounter (HOSPITAL_COMMUNITY): Payer: Medicare HMO

## 2021-05-23 ENCOUNTER — Encounter (HOSPITAL_COMMUNITY): Payer: Medicare HMO

## 2021-05-26 ENCOUNTER — Encounter (HOSPITAL_COMMUNITY): Payer: Medicare HMO

## 2021-05-28 ENCOUNTER — Ambulatory Visit (INDEPENDENT_AMBULATORY_CARE_PROVIDER_SITE_OTHER): Payer: Medicare HMO | Admitting: Cardiology

## 2021-05-28 ENCOUNTER — Encounter (HOSPITAL_COMMUNITY): Payer: Medicare HMO

## 2021-05-28 ENCOUNTER — Encounter: Payer: Self-pay | Admitting: Cardiology

## 2021-05-28 DIAGNOSIS — E785 Hyperlipidemia, unspecified: Secondary | ICD-10-CM

## 2021-05-28 DIAGNOSIS — I252 Old myocardial infarction: Secondary | ICD-10-CM | POA: Diagnosis not present

## 2021-05-28 DIAGNOSIS — G20 Parkinson's disease: Secondary | ICD-10-CM | POA: Diagnosis not present

## 2021-05-28 DIAGNOSIS — I2510 Atherosclerotic heart disease of native coronary artery without angina pectoris: Secondary | ICD-10-CM | POA: Insufficient documentation

## 2021-05-28 DIAGNOSIS — I493 Ventricular premature depolarization: Secondary | ICD-10-CM

## 2021-05-28 NOTE — Assessment & Plan Note (Signed)
Excellent Crestor 20 mg once a day LDL 56 ALT 11.  Hemoglobin A1c 5.3 from outside labs.  Outstanding.  No myalgias. ?

## 2021-05-28 NOTE — Assessment & Plan Note (Signed)
Moderate disease noted on cardiac catheterization.  No PCI during non-STEMI.  Continue with goal-directed medical therapy.  See above for details. ?

## 2021-05-28 NOTE — Progress Notes (Signed)
?Cardiology Office Note:   ? ?Date:  05/28/2021  ? ?ID:  Joshua [name], DOB 06-02-1945, MRN [id number] ? ?PCP:  Ginger Organ., MD ?  ?[facility] Prov[facility] ?Cardiologist:  Candee Furbish, MD ?Electrophysiologist:  Will Meredith Leeds, MD    ? ?Referring MD: Ginger Organ., MD  ? ?History of Present Illness:   ? ?Joshua [name] is a 76 y.o. male here for the follow-up of hypertension and NSTEMI.  ? ?He followed up with Coletta Memos, NP on 02/14/2021 complaining of nausea after eating 30-60 minutes prior. His at home blood pressures were running low 100s/60-80s. He also reported an increase in his PVC's, as well as increased tremors since being discharged from the hospital 01/2021; no further chest discomfort. His medications for Parkinson's were reviewed. He was restarted on 5 mg midodrine twice daily as needed for systolic BP less than 250.  ? ?On 02/05/2021 he was admitted to the hospital and diagnosed with NSTEMI. Troponins were elevated at 11,750. Cardiac catheterization showed diffuse moderate disease in LAD and RI, but no culprit lesion was found. Echo showed LVEF 60-65%. Flecainide was stopped in the setting of NSTEMI. ? ?Loop recorder insertion on 04/04/2015.  Prior Holter monitor in 06/2015 was notable for a high PVC burden at 26%. He subsequently underwent an unsuccessful PVC ablation 09/26/15 with less PVCs than prior. He was started on flecainide, later discontinued following his NSTEMI. ? ?Today: ?Overall, he continues to notice palpitations, but they are less severe than prior to his ablation. ? ?At home his blood pressure has been running 125/70s. Sometimes it will drop to [id number] systolic. He is taking 1 midodrine every day. ? ?He notes that his mexiletine was decreased to 150 mg BID due to the higher dose causing GI upset. ? ?No bleeding issues. ? ?He denies any chest pain, shortness of breath, or peripheral edema. No lightheadedness, headaches, syncope, orthopnea, or PND. ? ?In  his family, his older brother had 2 heart attacks and stents placed. His father died of a heart attack. ? ? ?Past Medical History:  ?Diagnosis Date  ? Allergic rhinitis   ? Allergy   ? Anal fissure   ? Anxiety   ? from chronic pain from surgery- on Cymbalta  ? Arthritis   ? Asthma   ? Barrett's esophagus 03/29/2014  ? Carotid artery disease ([place])   ? Carotid Doppler normal August, 2007  ? Cataract   ? Chest pain   ? Coronaries normal 1996 /  nuclear..06/2002..normal...EF  56% /  stress echo.. May, 2011.... no  scar or ischemia... rate related RBBB  ? Diverticulosis   ? Dyslipidemia   ? Ejection fraction   ? EF 60%, stress echo, May, 2011  ? GERD (gastroesophageal reflux disease)   ? Headache   ? History of loop recorder   ? has since 04/04/15  ? HTN (hypertension)   ? takes Metoprolol for PVC control  ? Hx of colonic polyps   ? adenomatous  ? Hx of colonoscopy   ? Hyperlipidemia   ? IFG (impaired fasting glucose)   ? Palpitations   ? Benign PVCs  ? Parkinson disease ([place])   ? Prostate cancer ([place])   ? RBBB (right bundle branch block)   ? rate related  ? Shingles   ? Stroke [facility])   ? TIA  ? TIA (transient ischemic attack) 02/2015  ? Per pt, had 2 strokes  ? Tremor   ? Hand tremor  ? Vertigo   ? ? ?  Past Surgical History:  ?Procedure Laterality Date  ? BACK SURGERY  2002,2009  ? x 6  ? CHOLECYSTECTOMY  11/30/2012  ? with IOC  ? COLONOSCOPY    ? ELECTROPHYSIOLOGIC STUDY N/A 09/26/2015  ? Procedure: V Tach Ablation (PVC);  Surgeon: Will Meredith Leeds, MD;  Location: [place] [facility];  Service: Cardiovascular;  Laterality: N/A;  ? EP IMPLANTABLE DEVICE N/A 04/04/2015  ? Procedure: Loop Recorder Insertion;  Surgeon: Will Meredith Leeds, MD;  Location: [place] [facility];  Service: Cardiovascular;  Laterality: N/A;  ? HERNIA REPAIR    ? laprascopic  ? KNEE SURGERY    ? DECEMBER 2017,LEFT KNEE SCOPED  ? LEFT HEART CATH AND CORONARY ANGIOGRAPHY N/A 02/05/2021  ? Procedure: LEFT HEART CATH AND CORONARY ANGIOGRAPHY;  Surgeon:  Wellington Hampshire, MD;  Location: [place];  Service: Cardiovascular;  Laterality: N/A;  ? NECK SURGERY  2002  ? POLYPECTOMY    ? ROTATOR CUFF REPAIR Left   ? TEE WITHOUT CARDIOVERSION N/A 04/04/2015  ? Procedure: TRANSESOPHAGEAL ECHOCARDIOGRAM (TEE);  Surgeon: Lelon Perla, MD;  Location: [facility] ENDOSCOPY;  Service: Cardiovascular;  Laterality: N/A;  ? TRIGGER FINGER RELEASE Left 12/28/2019  ? Procedure: RELEASE TRIGGER FINGER/A-1 PULLEY THUMB, MIDDLE AND RING;  Surgeon: Daryll Brod, MD;  Location: [place];  Service: Orthopedics;  Laterality: Left;  FAB  ? UPPER GASTROINTESTINAL ENDOSCOPY    ? V Tach ablation  09/26/2015  ? ? ?Current Medications: ?Current Meds  ?Medication Sig  ? acetaminophen (TYLENOL) 500 MG tablet Take 1,000 mg by mouth in the morning and at bedtime.  ? aspirin 81 MG EC tablet Take 1 tablet (81 mg total) by mouth daily. Swallow whole.  ? Biotin 10000 MCG TABS Take 1,000 mcg by mouth in the morning.  ? carbidopa-levodopa (SINEMET CR) 50-200 MG tablet TAKE 1 TABLET BY MOUTH EVERYDAY AT BEDTIME  ? carbidopa-levodopa (SINEMET IR) 25-100 MG tablet 2 tablets at 7 AM/2 tablets at 10 AM/2 at 1pm/2 at 4pm/2 at 7pm  ? cholecalciferol (VITAMIN D3) 25 MCG (1000 UNIT) tablet Take 1,000 Units by mouth daily.  ? clopidogrel (PLAVIX) 75 MG tablet Take 1 tablet (75 mg total) by mouth daily.  ? Cyanocobalamin (VITAMIN B 12 PO) Take 1,000 mcg by mouth daily.  ? DYMISTA 137-50 MCG/ACT SUSP Place 1 puff into both nostrils at bedtime.   ? escitalopram (LEXAPRO) 10 MG tablet Take 10 mg by mouth daily.  ? finasteride (PROSCAR) 5 MG tablet Take 5 mg by mouth every evening.  ? lansoprazole (PREVACID) 30 MG capsule Take 30 mg by mouth 2 (two) times daily. Morning & mid-afternoon  ? levocetirizine (XYZAL) 5 MG tablet Take 5 mg by mouth every evening.    ? metoprolol succinate (TOPROL-XL) 25 MG 24 hr tablet Take 1/2 tablet (12.5 mg total) by mouth daily.  ? mexiletine (MEXITIL) 150 MG capsule  Take 1 capsule (150 mg total) by mouth 2 (two) times daily.  ? midodrine (PROAMATINE) 5 MG tablet Take 1 tablet (5 mg total) by mouth 2 (two) times daily as needed. Take if systolic blood pressure gets below 100  ? montelukast (SINGULAIR) 10 MG tablet Take 10 mg by mouth at bedtime.    ? nitroGLYCERIN (NITROSTAT) 0.4 MG SL tablet Place 1 tablet (0.4 mg total) under the tongue every 5 (five) minutes as needed for chest pain (Do not give more than 3 SL tablets in 15 minutes.).  ? Omega-3 Fatty Acids (FISH OIL) 1000  MG CAPS Take 1,000 mg by mouth in the morning.  ? PROCTOSOL HC 2.5 % rectal cream APPLY INTO AND AROUND RECTUM 2 TIMES A DAY  ? rosuvastatin (CRESTOR) 20 MG tablet Take 1 tablet (20 mg total) by mouth at bedtime.  ? tamsulosin (FLOMAX) 0.4 MG CAPS capsule Take 0.4 mg by mouth daily after supper.   ? traMADol (ULTRAM) 50 MG tablet Take 50 mg by mouth every 6 (six) hours as needed. for pain  ? zolpidem (AMBIEN) 10 MG tablet Take 10 mg by mouth at bedtime.  ?  ? ?Allergies:   Floxin i.v. in dextrose 5% [ofloxacin] and Terazosin  ? ?Social History  ? ?Socioeconomic History  ? Marital status: Married  ?  Spouse name: [name]   ? Number of children: 2  ? Years of education: 79  ? Highest education level: Some college, no degree  ?Occupational History  ? Occupation: Retired  ?  Comment: Dance movement psychotherapist  ?Tobacco Use  ? Smoking status: Never  ? Smokeless tobacco: Never  ?Vaping Use  ? Vaping Use: Never used  ?Substance and Sexual Activity  ? Alcohol use: No  ?  Alcohol/week: 0.0 standard drinks  ? Drug use: No  ? Sexual activity: Not Currently  ?Other Topics Concern  ? Not on file  ?Social History Narrative  ? Lives with wife.   ? Caffeine use: Coffee/tea/soda- ocassionally  ? Right-handed  ? ?Social Determinants of Health  ? ?Financial Resource Strain: Not on file  ?Food Insecurity: Not on file  ?Transportation Needs: Not on file  ?Physical Activity: Not on file  ?Stress: Not on file  ?Social Connections: Not on file   ?  ? ?Family History: ?The patient's family history includes Breast cancer in his paternal grandmother; Clotting disorder in his father; Healthy in his daughter and daughter; Heart attack in his father and

## 2021-05-28 NOTE — Patient Instructions (Signed)

## 2021-05-28 NOTE — Assessment & Plan Note (Signed)
Continue with goal-directed medical therapy.  He is on aspirin and Plavix.  Seems reasonable to continue.  He is not having any bleeding issues.  After 1 year we could consider Plavix discontinuation.  Continue with low-dose metoprolol as well.  On Crestor high intensity dose.  Doing great. ?

## 2021-05-28 NOTE — Assessment & Plan Note (Addendum)
Status post PVC ablation.  Dr. Curt Bears.  On mexiletine for further suppression.  He did cut back on his mexiletine to 150 mg twice a day to help with his upset stomach.  This has helped.  On exam today I did not hear a single PVC.  Review of high risk medications. ?

## 2021-05-28 NOTE — Assessment & Plan Note (Signed)
Does take a midodrine every day.  This helps support his blood pressure.  Also uses recumbent bike at home.  Excellent.  Energy has been decreasing. ?

## 2021-05-30 ENCOUNTER — Encounter (HOSPITAL_COMMUNITY): Payer: Medicare HMO

## 2021-06-02 ENCOUNTER — Encounter (HOSPITAL_COMMUNITY): Payer: Medicare HMO

## 2021-06-04 ENCOUNTER — Encounter (HOSPITAL_COMMUNITY): Payer: Medicare HMO

## 2021-06-06 ENCOUNTER — Encounter (HOSPITAL_COMMUNITY): Payer: Medicare HMO

## 2021-06-11 ENCOUNTER — Encounter (HOSPITAL_COMMUNITY): Payer: Medicare HMO

## 2021-06-13 ENCOUNTER — Encounter (HOSPITAL_COMMUNITY): Payer: Medicare HMO

## 2021-06-13 ENCOUNTER — Encounter: Payer: Self-pay | Admitting: Cardiology

## 2021-06-13 NOTE — Telephone Encounter (Signed)
Pt advised to stop Mexiletine per Dr. Curt Bears. Patient verbalized understanding and agreeable to plan.  Will keep f/u later this month w/ Dr. Curt Bears

## 2021-06-21 ENCOUNTER — Other Ambulatory Visit: Payer: Self-pay | Admitting: Cardiology

## 2021-06-26 ENCOUNTER — Other Ambulatory Visit: Payer: Self-pay | Admitting: Neurosurgery

## 2021-06-26 ENCOUNTER — Other Ambulatory Visit: Payer: Self-pay | Admitting: Neurology

## 2021-06-26 DIAGNOSIS — G20 Parkinson's disease: Secondary | ICD-10-CM

## 2021-06-26 DIAGNOSIS — M5416 Radiculopathy, lumbar region: Secondary | ICD-10-CM

## 2021-07-08 ENCOUNTER — Encounter: Payer: Self-pay | Admitting: Cardiology

## 2021-07-08 ENCOUNTER — Ambulatory Visit (INDEPENDENT_AMBULATORY_CARE_PROVIDER_SITE_OTHER): Payer: Medicare HMO | Admitting: Cardiology

## 2021-07-08 VITALS — BP 116/70 | HR 60 | Ht 72.0 in | Wt 216.6 lb

## 2021-07-08 DIAGNOSIS — I493 Ventricular premature depolarization: Secondary | ICD-10-CM

## 2021-07-11 ENCOUNTER — Ambulatory Visit
Admission: RE | Admit: 2021-07-11 | Discharge: 2021-07-11 | Disposition: A | Discharge status: Home / Self Care | Payer: Medicare HMO | Source: Ambulatory Visit | Attending: Neurosurgery | Admitting: Neurosurgery

## 2021-07-11 DIAGNOSIS — M5416 Radiculopathy, lumbar region: Secondary | ICD-10-CM

## 2021-07-11 MED ORDER — GADOBENATE DIMEGLUMINE 529 MG/ML IV SOLN
20.0000 mL | Freq: Once | INTRAVENOUS | Status: AC | PRN
  Administered 2021-07-11: 20 mL via INTRAVENOUS

## 2021-08-08 ENCOUNTER — Other Ambulatory Visit: Payer: Self-pay | Admitting: Neurosurgery

## 2021-08-08 DIAGNOSIS — M5440 Lumbago with sciatica, unspecified side: Secondary | ICD-10-CM

## 2021-08-18 ENCOUNTER — Ambulatory Visit
Admission: RE | Admit: 2021-08-18 | Discharge: 2021-08-18 | Disposition: A | Discharge status: Home / Self Care | Payer: Medicare HMO | Source: Ambulatory Visit | Attending: Neurosurgery | Admitting: Neurosurgery

## 2021-08-18 DIAGNOSIS — M5440 Lumbago with sciatica, unspecified side: Secondary | ICD-10-CM

## 2021-08-20 NOTE — Progress Notes (Deleted)
Assessment/Plan:   1.  Parkinsons Disease, possibly ET/PD  -*** carbidopa/levodopa 25/100,2 tablets at 7 AM/2 tablets at 10 AM/2 at 1pm/2 at 4pm/2 at 7pm  -Continue carbidopa/levodopa 50/200 at bedtime  -May repeat levodopa challenge in the future, especially if we consider surgical interventions.  We did this several years ago, but at that point in time he only had tremor (and he has levodopa resistant tremor).  He has since developed more bradykinesia.  In addition, we may retry pramipexole   2.  History of watershed infarct  -Had his linq recorder explanted after it stopped working and never showed any issues  -On Plavix  3.  History of multiple syncopal episodes, likely due to [facility]  -Off of propranolol and no further syncope but still with lightheadedness/fatigue.  However, cardiology has added metoprolol ever since his NSTEMI.    -Patient is on midodrine, but is only taking the 5 mg once daily.  4.  Low back pain  -Has had surgical interventions in the past.  Has followed with Dr. Brien Few in the past.    5.  NSTEMI, 01/2020  -He was started on metoprolol at that time, although his Imdur was stopped because of hypotension.  -Hospital follow-up from this indicated patient was hypotensive.  Worry about the addition of metoprolol, as patient had had syncopal episodes on propranolol previously, but likely okay given the small dose and the fact he is back on midodrine.  Subjective:   Joshua [name] was seen today in follow up for Parkinsons disease.  My previous records were reviewed prior to todays visit as well as outside records available to me.  Wife with patient and supplements the hx. patient was supposed to follow-up with me last month, but ended up in the hospital with NSTEMI.  Records reviewed.  He was hypotensive in the hospital.  Imdur was discontinued.  He was started on metoprolol.  He followed up with the cardiology NP on February 3 and reported that he just was not  feeling well.  He felt nauseated and weak.  He noted that his blood pressures have been running low at home.  He noted that his midodrine had been stopped in the hospital.  Cardiology and he restarted it for "systolic blood pressure less than 100."  Pt states that he saw Dr. Brigitte Pulse since then and he told him to take the mididrine faithfully.  He is feeling much better since he started doing that.  Pt getting ready to start cardiac rehab but told it was a few months wait.    Current prescribed movement disorder medications: Carbidopa/levodopa 25/100, 2 tablets at 7 AM/2 tablets at 10 AM/2 at 1pm/2 at 4pm/2 at 7pm (an increase) Carbidopa/levodopa 50/200 at bedtime Midodrine 5 mg daily   PREVIOUS MEDICATIONS: Pramipexole (just was not helpful for tremor); primidone; propranolol; metoprolol  ALLERGIES:   Allergies  Allergen Reactions   Floxin I.V. In Dextrose 5% [Ofloxacin] Other (See Comments)    Lowers BP Lowers BP   Terazosin Other (See Comments)    Lower bp    CURRENT MEDICATIONS:  Outpatient Encounter Medications as of 09/02/2021  Medication Sig   acetaminophen (TYLENOL) 500 MG tablet Take 1,000 mg by mouth in the morning and at bedtime.   aspirin 81 MG EC tablet Take 1 tablet (81 mg total) by mouth daily. Swallow whole.   Biotin 10000 MCG TABS Take 1,000 mcg by mouth in the morning.   carbidopa-levodopa (SINEMET CR) 50-200 MG tablet TAKE 1  TABLET BY MOUTH EVERYDAY AT BEDTIME   carbidopa-levodopa (SINEMET IR) 25-100 MG tablet 2 tablets at 7 AM/2 tablets at 10 AM/2 at 1pm/2 at 4pm/2 at 7pm   cholecalciferol (VITAMIN D3) 25 MCG (1000 UNIT) tablet Take 1,000 Units by mouth daily.   clopidogrel (PLAVIX) 75 MG tablet Take 1 tablet (75 mg total) by mouth daily.   Cyanocobalamin (VITAMIN B 12 PO) Take 1,000 mcg by mouth daily.   DYMISTA 137-50 MCG/ACT SUSP Place 1 puff into both nostrils at bedtime.    escitalopram (LEXAPRO) 10 MG tablet Take 10 mg by mouth daily.   finasteride (PROSCAR) 5  MG tablet Take 5 mg by mouth every evening.   lansoprazole (PREVACID) 30 MG capsule Take 30 mg by mouth 2 (two) times daily. Morning & mid-afternoon   levocetirizine (XYZAL) 5 MG tablet Take 5 mg by mouth every evening.     metoprolol succinate (TOPROL-XL) 25 MG 24 hr tablet TAKE 1/2 TABLET BY MOUTH DAILY   midodrine (PROAMATINE) 5 MG tablet Take 1 tablet (5 mg total) by mouth 2 (two) times daily as needed. Take if systolic blood pressure gets below 100   montelukast (SINGULAIR) 10 MG tablet Take 10 mg by mouth at bedtime.     nitroGLYCERIN (NITROSTAT) 0.4 MG SL tablet Place 1 tablet (0.4 mg total) under the tongue every 5 (five) minutes as needed for chest pain (Do not give more than 3 SL tablets in 15 minutes.).   Omega-3 Fatty Acids (FISH OIL) 1000 MG CAPS Take 1,000 mg by mouth in the morning.   PROCTOSOL HC 2.5 % rectal cream APPLY INTO AND AROUND RECTUM 2 TIMES A DAY   rosuvastatin (CRESTOR) 20 MG tablet Take 1 tablet (20 mg total) by mouth at bedtime.   tamsulosin (FLOMAX) 0.4 MG CAPS capsule Take 0.4 mg by mouth daily after supper.    traMADol (ULTRAM) 50 MG tablet Take 50 mg by mouth every 6 (six) hours as needed. for pain   zolpidem (AMBIEN) 10 MG tablet Take 10 mg by mouth at bedtime.   No facility-administered encounter medications on file as of 09/02/2021.    Objective:   PHYSICAL EXAMINATION:    VITALS:   There were no vitals filed for this visit.     GEN:  The patient appears stated age and is in NAD. HEENT:  Normocephalic, atraumatic.  The mucous membranes are moist. The superficial temporal arteries are without ropiness or tenderness. CV:  RRR Lungs:  CTAB Neck/HEME:  There are no carotid bruits bilaterally.  Neurological examination:  Orientation: The patient is alert and oriented x3. Cranial nerves: There is good facial symmetry with facial hypomimia. The speech is fluent and clear. Soft palate rises symmetrically and there is no tongue deviation. Hearing is  intact to conversational tone. Sensation: Sensation is intact to light touch throughout Motor: Strength is at least antigravity x4.  Movement examination: Tone: There is mild to mod increased tone in the RLE Abnormal movements: there is RUE>LUE rest tremor Coordination:  There is decremation, with any form of RAMS, including alternating supination and pronation of the forearm, hand opening and closing, finger taps, heel taps and toe taps, mostly on the right Gait and Station: The patient has no difficulty arising out of a deep-seated chair without the use of the hands. The patient is forward flexed with decreased arm swing  I have reviewed and interpreted the following labs independently    Chemistry      Component Value Date/Time  NA 136 02/14/2021 1528   K 4.1 02/14/2021 1528   CL 100 02/14/2021 1528   CO2 18 (L) 02/14/2021 1528   BUN 20 02/14/2021 1528   CREATININE 1.07 02/14/2021 1528   CREATININE 0.88 09/18/2015 1135      Component Value Date/Time   CALCIUM 9.6 02/14/2021 1528   ALKPHOS 58 02/05/2021 1147   AST 23 02/05/2021 1147   ALT 11 02/05/2021 1147   BILITOT 1.0 02/05/2021 1147   BILITOT 0.7 04/04/2015 1501       Lab Results  Component Value Date   WBC 7.9 02/14/2021   HGB 15.3 02/14/2021   HCT 44.3 02/14/2021   MCV 92 02/14/2021   PLT 262 02/14/2021    Lab Results  Component Value Date   TSH 1.240 04/04/2015     Total time spent on today's visit was *** minutes, including both face-to-face time and nonface-to-face time.  Time included that spent on review of records (prior notes available to me/labs/imaging if pertinent), discussing treatment and goals, answering patient's questions and coordinating care.  Cc:  Ginger Organ., MD

## 2021-08-26 ENCOUNTER — Other Ambulatory Visit (HOSPITAL_BASED_OUTPATIENT_CLINIC_OR_DEPARTMENT_OTHER): Payer: Self-pay | Admitting: General Practice

## 2021-08-26 ENCOUNTER — Other Ambulatory Visit: Payer: Self-pay | Admitting: Neurology

## 2021-08-26 DIAGNOSIS — G20 Parkinson's disease: Secondary | ICD-10-CM

## 2021-08-27 ENCOUNTER — Other Ambulatory Visit: Payer: Self-pay

## 2021-08-29 ENCOUNTER — Other Ambulatory Visit: Payer: Self-pay | Admitting: Neurology

## 2021-08-29 DIAGNOSIS — G20 Parkinson's disease: Secondary | ICD-10-CM

## 2021-09-02 ENCOUNTER — Ambulatory Visit: Payer: Medicare HMO | Admitting: Neurology

## 2021-11-14 NOTE — Progress Notes (Signed)
Assessment/Plan:   1.  Parkinsons Disease, possibly ET/PD  -Continue carbidopa/levodopa 25/100,2 tablets at 7 AM/2 tablets at 10 AM/2 at 1pm/2 at 4pm/2 at 7pm  -Continue carbidopa/levodopa 50/200 at bedtime  -May repeat levodopa challenge in the future, especially if we consider surgical interventions.  We did this several years ago, but at that point in time he only had tremor (and he has levodopa resistant tremor).  He has since developed more bradykinesia.  In addition, we may retry pramipexole  -Discussed newer skin biopsies for alpha-synuclein.  He is interested.  Discussed he may bleed a little more due to asa/plavix  -refer to aqua therapy for Parkinsons Disease    2.  History of watershed infarct  -Had his linq recorder explanted after it stopped working and never showed any issues  -On Plavix  3.  History of multiple syncopal episodes, likely due to [facility]  -Off of propranolol and no further syncope.  cardiology has added metoprolol ever since his NSTEMI.    -continue midodrine 5 mg bid.   4.  Low back pain  -Has had surgical interventions in the past.  Has followed with Dr. Murray Hodgkins and Dr. Wynetta Emery  5.  NSTEMI, 01/2020  -He was started on metoprolol at that time, although his Imdur was stopped because of hypotension.   Subjective:   Joshua [name] was seen today in follow up for Parkinsons disease.  My previous records were reviewed prior to todays visit as well as outside records available to me.  Wife with patient and supplements the hx.   patient last seen in February, 2023.  He has had no falls.  Tremor is worse.  No lightheadedness or near syncope.  He is on midodrine.  He didn't take it this morning but states that the low BP this morning is unusual.  Unfortunately, his twin brother did pass away since our last visit from lung cancer.  He has been following with Dr. Wynetta Emery and Murray Hodgkins re: back.  Got prednisone and helped.    Pt getting ready to start cardiac rehab but  told it was a few months wait.    Current prescribed movement disorder medications: Carbidopa/levodopa 25/100,2 tablets at 7 AM/2 tablets at 10 AM/2 at 1pm/2 at 4pm/2 at 7pm (an increase) Carbidopa/levodopa 50/200 at bedtime Midodrine 5 mg bid   PREVIOUS MEDICATIONS: Pramipexole (just was not helpful for tremor); primidone; propranolol; metoprolol  ALLERGIES:   Allergies  Allergen Reactions   Floxin I.V. In Dextrose 5% [Ofloxacin] Other (See Comments)    Lowers BP Lowers BP   Terazosin Other (See Comments)    Lower bp    CURRENT MEDICATIONS:  Outpatient Encounter Medications as of 11/18/2021  Medication Sig   acetaminophen (TYLENOL) 500 MG tablet Take 1,000 mg by mouth in the morning and at bedtime.   aspirin 81 MG EC tablet Take 1 tablet (81 mg total) by mouth daily. Swallow whole.   Biotin 21308 MCG TABS Take 1,000 mcg by mouth in the morning.   carbidopa-levodopa (SINEMET CR) 50-200 MG tablet TAKE 1 TABLET BY MOUTH EVERYDAY AT BEDTIME   cholecalciferol (VITAMIN D3) 25 MCG (1000 UNIT) tablet Take 1,000 Units by mouth daily.   clopidogrel (PLAVIX) 75 MG tablet Take 1 tablet (75 mg total) by mouth daily.   Cyanocobalamin (VITAMIN B 12 PO) Take 1,000 mcg by mouth daily.   DYMISTA 137-50 MCG/ACT SUSP Place 1 puff into both nostrils at bedtime.    escitalopram (LEXAPRO) 10 MG tablet Take  10 mg by mouth daily.   finasteride (PROSCAR) 5 MG tablet Take 5 mg by mouth every evening.   lansoprazole (PREVACID) 30 MG capsule Take 30 mg by mouth 2 (two) times daily. Morning & mid-afternoon   levocetirizine (XYZAL) 5 MG tablet Take 5 mg by mouth every evening.     metoprolol succinate (TOPROL-XL) 25 MG 24 hr tablet TAKE 1/2 TABLET BY MOUTH DAILY   midodrine (PROAMATINE) 5 MG tablet TAKE 1 TABLET BY MOUTH 2 TIMES DAILY AS NEEDED. TAKE IF SYSTOLIC BLOOD PRESSURE GETS BELOW 100   montelukast (SINGULAIR) 10 MG tablet Take 10 mg by mouth at bedtime.     nitroGLYCERIN (NITROSTAT) 0.4 MG SL tablet  Place 1 tablet (0.4 mg total) under the tongue every 5 (five) minutes as needed for chest pain (Do not give more than 3 SL tablets in 15 minutes.).   Omega-3 Fatty Acids (FISH OIL) 1000 MG CAPS Take 1,000 mg by mouth in the morning.   PROCTOSOL HC 2.5 % rectal cream APPLY INTO AND AROUND RECTUM 2 TIMES A DAY   rosuvastatin (CRESTOR) 20 MG tablet Take 1 tablet (20 mg total) by mouth at bedtime.   tamsulosin (FLOMAX) 0.4 MG CAPS capsule Take 0.4 mg by mouth daily after supper.    traMADol (ULTRAM) 50 MG tablet Take 50 mg by mouth every 6 (six) hours as needed. for pain   zolpidem (AMBIEN) 10 MG tablet Take 10 mg by mouth at bedtime.   [DISCONTINUED] carbidopa-levodopa (SINEMET IR) 25-100 MG tablet 2 TABLETS AT 7 AM/2 TABLETS AT 10 AM/2 AT 1PM/1 AT 4PM/1 AT 7PM   carbidopa-levodopa (SINEMET IR) 25-100 MG tablet 2 tablets at 7 AM/2 tablets at 10 AM/2 at 1pm/2 at 4pm/2 at 7pm   No facility-administered encounter medications on file as of 11/18/2021.    Objective:   PHYSICAL EXAMINATION:    VITALS:   Vitals:   11/18/21 0846  BP: 109/71  Pulse: 81  SpO2: 96%  Weight: 220 lb (99.8 kg)  Height: 6\' 1"  (1.854 m)       GEN:  The patient appears stated age and is in NAD. HEENT:  Normocephalic, atraumatic.  The mucous membranes are moist. The superficial temporal arteries are without ropiness or tenderness. CV:  RRR Lungs:  CTAB Neck/HEME:  There are no carotid bruits bilaterally.  Neurological examination:  Orientation: The patient is alert and oriented x3. Cranial nerves: There is good facial symmetry with facial hypomimia. The speech is fluent and clear. Soft palate rises symmetrically and there is no tongue deviation. Hearing is intact to conversational tone. Sensation: Sensation is intact to light touch throughout Motor: Strength is at least antigravity x4.  Movement examination: Tone: There is mild increased tone in the RLE Abnormal movements: there is no significant tremor  today Coordination:  There is mild decremation, with any form of RAMS, including alternating supination and pronation of the forearm, hand opening and closing, finger taps, heel taps and toe taps, mostly on the right Gait and Station: The patient has no difficulty arising out of a deep-seated chair without the use of the hands. The patient is forward flexed with decreased arm swing and he slightly drags the R leg  I have reviewed and interpreted the following labs independently    Chemistry      Component Value Date/Time   NA 136 02/14/2021 1528   K 4.1 02/14/2021 1528   CL 100 02/14/2021 1528   CO2 18 (L) 02/14/2021 1528   BUN  20 02/14/2021 1528   CREATININE 1.07 02/14/2021 1528   CREATININE 0.88 09/18/2015 1135      Component Value Date/Time   CALCIUM 9.6 02/14/2021 1528   ALKPHOS 58 02/05/2021 1147   AST 23 02/05/2021 1147   ALT 11 02/05/2021 1147   BILITOT 1.0 02/05/2021 1147   BILITOT 0.7 04/04/2015 1501       Lab Results  Component Value Date   WBC 7.9 02/14/2021   HGB 15.3 02/14/2021   HCT 44.3 02/14/2021   MCV 92 02/14/2021   PLT 262 02/14/2021    Lab Results  Component Value Date   TSH 1.240 04/04/2015     Total time spent on today's visit was 31 minutes, including both face-to-face time and nonface-to-face time.  Time included that spent on review of records (prior notes available to me/labs/imaging if pertinent), discussing treatment and goals, answering patient's questions and coordinating care.  Cc:  [name], MD

## 2021-11-18 ENCOUNTER — Ambulatory Visit (INDEPENDENT_AMBULATORY_CARE_PROVIDER_SITE_OTHER): Payer: Medicare HMO | Admitting: Neurology

## 2021-11-18 ENCOUNTER — Encounter: Payer: Self-pay | Admitting: Neurology

## 2021-11-18 VITALS — BP 109/71 | HR 81 | Ht 73.0 in | Wt 220.0 lb

## 2021-11-18 DIAGNOSIS — G20A1 Parkinson's disease without dyskinesia, without mention of fluctuations: Secondary | ICD-10-CM | POA: Diagnosis not present

## 2021-11-18 MED ORDER — CARBIDOPA-LEVODOPA 25-100 MG PO TABS: ORAL_TABLET | ORAL | 2 refills | Status: DC

## 2021-11-25 ENCOUNTER — Encounter: Payer: Self-pay | Admitting: Cardiology

## 2021-11-25 ENCOUNTER — Ambulatory Visit: Payer: Medicare HMO | Attending: Cardiology | Admitting: Cardiology

## 2021-11-25 VITALS — BP 110/80 | HR 65 | Ht 73.0 in | Wt 220.0 lb

## 2021-11-25 DIAGNOSIS — I252 Old myocardial infarction: Secondary | ICD-10-CM | POA: Diagnosis not present

## 2021-11-25 DIAGNOSIS — I493 Ventricular premature depolarization: Secondary | ICD-10-CM | POA: Diagnosis not present

## 2021-11-25 DIAGNOSIS — I2510 Atherosclerotic heart disease of native coronary artery without angina pectoris: Secondary | ICD-10-CM | POA: Diagnosis not present

## 2021-11-25 DIAGNOSIS — G20B1 Parkinson's disease with dyskinesia, without mention of fluctuations: Secondary | ICD-10-CM

## 2021-11-25 DIAGNOSIS — I4510 Unspecified right bundle-branch block: Secondary | ICD-10-CM

## 2021-11-25 NOTE — Progress Notes (Signed)
Cardiology Office Note:    Date:  11/25/2021   ID:  Joshua, [name] Oct 06, 1945, MRN [id number]  PCP:  Ginger Organ., MD   [facility] Providers Cardiologist:  Candee Furbish, MD Electrophysiologist:  Will Meredith Leeds, MD     Referring MD: Ginger Organ., MD   History of Present Illness:    Joshua [name] is a 76 y.o. male here for the follow-up of PVCs prior ablation, Dr. Curt Bears, hypertension and NSTEMI.   He followed up with Coletta Memos, NP on 02/14/2021 complaining of nausea after eating 30-60 minutes prior. His at home blood pressures were running low 100s/60-80s. He also reported an increase in his PVC's, as well as increased tremors since being discharged from the hospital 01/2021; no further chest discomfort. His medications for Parkinson's were reviewed. He was restarted on 5 mg midodrine twice daily as needed for systolic BP less than 937.   On 02/05/2021 he was admitted to the hospital and diagnosed with NSTEMI. Troponins were elevated at 11,750. Cardiac catheterization showed diffuse moderate disease in LAD and RI, but no culprit lesion was found. Echo showed LVEF 60-65%. Flecainide was stopped in the setting of NSTEMI.  Loop recorder insertion on 04/04/2015.  Prior Holter monitor in 06/2015 was notable for a high PVC burden at 26%. He subsequently underwent an unsuccessful PVC ablation 09/26/15 with less PVCs than prior. He was started on flecainide, later discontinued following his NSTEMI.  At his last visit, overall he continued to notice palpitations, but they were less severe than prior to his ablation.  At home his blood pressure had been running 125/70s. Sometimes it would drop to 169C-789F systolic. He was taking 1 midodrine every day.  He noted that his mexiletine was decreased to 150 mg BID due to the higher dose causing GI upset. He reported no bleeding issues.  In his family, his older brother had 2 heart attacks and stents placed. His  father died of a heart attack.  Today, he presents complaining of PVCs and fatigue. With exertion he begins to feel unwell and fatigued.  He stopped taking Mexiletine due to it making him experience heart burn. He is compliantly taking midodrine and rosuvastatin.  He reports that he has an aquatic therapy appointment scheduled soon which will help him stay active.  He denies any palpitations, chest pain, shortness of breath, or peripheral edema. No lightheadedness, headaches, syncope, orthopnea, or PND.  Past Medical History:  Diagnosis Date   Allergic rhinitis    Allergy    Anal fissure    Anxiety    from chronic pain from surgery- on Cymbalta   Arthritis    Asthma    Barrett's esophagus 03/29/2014   Carotid artery disease ([place])    Carotid Doppler normal August, 2007   Cataract    Chest pain    Coronaries normal 1996 /  nuclear..06/2002..normal...EF  56% /  stress echo.. May, 2011.... no  scar or ischemia... rate related RBBB   Diverticulosis    Dyslipidemia    Ejection fraction    EF 60%, stress echo, May, 2011   GERD (gastroesophageal reflux disease)    Headache    History of loop recorder    has since 04/04/15   HTN (hypertension)    takes Metoprolol for PVC control   Hx of colonic polyps    adenomatous   Hx of colonoscopy    Hyperlipidemia    IFG (impaired fasting glucose)    Palpitations  Benign PVCs   Parkinson disease    Prostate cancer ([place])    RBBB (right bundle branch block)    rate related   Shingles    Stroke [facility])    TIA   TIA (transient ischemic attack) 02/2015   Per pt, had 2 strokes   Tremor    Hand tremor   Vertigo     Past Surgical History:  Procedure Laterality Date   BACK SURGERY  2002,2009   x 6   CHOLECYSTECTOMY  11/30/2012   with IOC   COLONOSCOPY     ELECTROPHYSIOLOGIC STUDY N/A 09/26/2015   Procedure: V Tach Ablation (PVC);  Surgeon: Will Meredith Leeds, MD;  Location: [place] [facility];  Service: Cardiovascular;  Laterality:  N/A;   EP IMPLANTABLE DEVICE N/A 04/04/2015   Procedure: Loop Recorder Insertion;  Surgeon: Will Meredith Leeds, MD;  Location: [place] [facility];  Service: Cardiovascular;  Laterality: N/A;   HERNIA REPAIR     laprascopic   KNEE SURGERY     DECEMBER 2017,LEFT KNEE SCOPED   LEFT HEART CATH AND CORONARY ANGIOGRAPHY N/A 02/05/2021   Procedure: LEFT HEART CATH AND CORONARY ANGIOGRAPHY;  Surgeon: Wellington Hampshire, MD;  Location: [place] [facility];  Service: Cardiovascular;  Laterality: N/A;   NECK SURGERY  2002   POLYPECTOMY     ROTATOR CUFF REPAIR Left    TEE WITHOUT CARDIOVERSION N/A 04/04/2015   Procedure: TRANSESOPHAGEAL ECHOCARDIOGRAM (TEE);  Surgeon: Lelon Perla, MD;  Location: [facility] ENDOS[facility];  Service: Cardiovascular;  Laterality: N/A;   TRIGGER FINGER RELEASE Left 12/28/2019   Procedure: RELEASE TRIGGER FINGER/A-1 PULLEY THUMB, MIDDLE AND RING;  Surgeon: Daryll Brod, MD;  Location: [place];  Service: Orthopedics;  Laterality: Left;  FAB   UPPER GASTROINTESTINAL ENDOSCOPY     V Tach ablation  09/26/2015    Current Medications: Current Meds  Medication Sig   acetaminophen (TYLENOL) 500 MG tablet Take 1,000 mg by mouth in the morning and at bedtime.   aspirin 81 MG EC tablet Take 1 tablet (81 mg total) by mouth daily. Swallow whole.   Biotin 10000 MCG TABS Take 1,000 mcg by mouth in the morning.   carbidopa-levodopa (SINEMET CR) 50-200 MG tablet TAKE 1 TABLET BY MOUTH EVERYDAY AT BEDTIME   carbidopa-levodopa (SINEMET IR) 25-100 MG tablet 2 tablets at 7 AM/2 tablets at 10 AM/2 at 1pm/2 at 4pm/2 at 7pm   cholecalciferol (VITAMIN D3) 25 MCG (1000 UNIT) tablet Take 1,000 Units by mouth daily.   clopidogrel (PLAVIX) 75 MG tablet Take 1 tablet (75 mg total) by mouth daily.   Cyanocobalamin (VITAMIN B 12 PO) Take 1,000 mcg by mouth daily.   DYMISTA 137-50 MCG/ACT SUSP Place 1 puff into both nostrils at bedtime.    escitalopram (LEXAPRO) 10 MG tablet Take 10 mg by mouth  daily.   finasteride (PROSCAR) 5 MG tablet Take 5 mg by mouth every evening.   lansoprazole (PREVACID) 30 MG capsule Take 30 mg by mouth 2 (two) times daily. Morning & mid-afternoon   levocetirizine (XYZAL) 5 MG tablet Take 5 mg by mouth every evening.     metoprolol succinate (TOPROL-XL) 25 MG 24 hr tablet TAKE 1/2 TABLET BY MOUTH DAILY   midodrine (PROAMATINE) 5 MG tablet TAKE 1 TABLET BY MOUTH 2 TIMES DAILY AS NEEDED. TAKE IF SYSTOLIC BLOOD PRESSURE GETS BELOW 100   montelukast (SINGULAIR) 10 MG tablet Take 10 mg by mouth at bedtime.     nitroGLYCERIN (NITROSTAT) 0.4  MG SL tablet Place 1 tablet (0.4 mg total) under the tongue every 5 (five) minutes as needed for chest pain (Do not give more than 3 SL tablets in 15 minutes.).   Omega-3 Fatty Acids (FISH OIL) 1000 MG CAPS Take 1,000 mg by mouth in the morning.   PROCTOSOL HC 2.5 % rectal cream APPLY INTO AND AROUND RECTUM 2 TIMES A DAY   rosuvastatin (CRESTOR) 20 MG tablet Take 1 tablet (20 mg total) by mouth at bedtime.   tamsulosin (FLOMAX) 0.4 MG CAPS capsule Take 0.4 mg by mouth daily after supper.    traMADol (ULTRAM) 50 MG tablet Take 50 mg by mouth every 6 (six) hours as needed. for pain   zolpidem (AMBIEN) 10 MG tablet Take 10 mg by mouth at bedtime.     Allergies:   Floxin i.v. in dextrose 5% [ofloxacin] and Terazosin   Social History   Socioeconomic History   Marital status: Married    Spouse name: Joshua [name]    Number of children: 2   Years of education: 15   Highest education level: Some college, no degree  Occupational History   Occupation: Retired    Comment: Dance movement psychotherapist  Tobacco Use   Smoking status: Never   Smokeless tobacco: Never  Scientific laboratory technician Use: Never used  Substance and Sexual Activity   Alcohol use: No    Alcohol/week: 0.0 standard drinks of alcohol   Drug use: No   Sexual activity: Not Currently  Other Topics Concern   Not on file  Social History Narrative   Lives with wife.    Caffeine use:  Coffee/tea/soda- ocassionally   Right-handed.   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   Social Determinants of Radio broadcast assistant Strain: Not on file  Food Insecurity: Not on file  Transportation Needs: No Transportation Needs (03/09/2018)   PRAPARE - Hydrologist (Medical): No    Lack of Transportation (Non-Medical): No  Physical Activity: Not on file  Stress: Not on file  Social Connections: Not on file     Family History: The patient's family history includes Breast cancer in his paternal grandmother; Clotting disorder in his father; Healthy in his daughter and daughter; Heart attack in his father and paternal grandfather; Heart disease in his brother and father; Hypertension in his mother; Lung cancer in his brother; Prostate cancer in his brother. There is no history of Esophageal cancer, Stomach cancer, Rectal cancer, or Colon polyps.  ROS:   Please see the history of present illness.    (+) Palpitations (+) Tremor on hand All other systems reviewed and are negative.  EKGs/Labs/Other Studies Reviewed:    The following studies were reviewed today:  Echo  02/06/2021:  1. Left ventricular ejection fraction, by estimation, is 60 to 65%. The  left ventricle has normal function. The left ventricle has no regional  wall motion abnormalities. There is mild concentric left ventricular  hypertrophy. Left ventricular diastolic  parameters are consistent with Grade I diastolic dysfunction (impaired  relaxation).   2. Right ventricular systolic function is normal. The right ventricular  size is normal. Tricuspid regurgitation signal is inadequate for assessing  PA pressure.   3. The mitral valve is normal in structure. Trivial mitral valve  regurgitation. No evidence of mitral stenosis.   4. The aortic valve is tricuspid. Aortic valve regurgitation is not  visualized. Aortic valve sclerosis is present, with no evidence of aortic  valve stenosis.    5. Aortic dilatation noted. There is borderline dilatation of the aortic  root, measuring 37 mm. There is mild dilatation of the ascending aorta,  measuring 38 mm.   6. The inferior vena cava is normal in size with greater than 50%  respiratory variability, suggesting right atrial pressure of 3 mmHg.  Left Heart Cath  02/05/2021:   Prox LAD to Mid LAD lesion is 30% stenosed.   1st Diag lesion is 70% stenosed.   Mid LAD-1 lesion is 40% stenosed.   Mid LAD-2 lesion is 60% stenosed.   Dist LAD lesion is 80% stenosed.   Ramus-1 lesion is 60% stenosed.   Ramus-2 lesion is 80% stenosed.   There is moderate left ventricular systolic dysfunction.   LV end diastolic pressure is mildly elevated.   The left ventricular ejection fraction is 35-45% by visual estimate.   1.  No clear culprit is identified for non-ST elevation myocardial infarction with no significant disease affecting the main vessels.  He does have distal LAD stenosis as well as distal ramus stenosis supplying small segments and diagonal disease.  Initially, the LAD was thought to be occluded but in reality, it had separate ostium from the left circumflex and I was able to selectively engage the LAD.   2.  Moderately reduced LV systolic function 5 to 67%.  Possible distal anterior and apical hypokinesis.   Recommendations: Medical therapy with nitroglycerin drip for pain control.  Resume heparin drip 6 hours. Obtain an echocardiogram to evaluate for possible stress-induced cardiomyopathy.  Diagnostic: Dominance: Right   CTA Chest/Abdomen/Pelvis 02/05/2021: IMPRESSION: 1. No thoracic or abdominal aortic aneurysm or dissection. 2. Three-vessel coronary artery calcifications. 3. Moderate atherosclerotic calcifications throughout the abdominal aorta and branch vessels but no significant stenosis or dissection. 4. No acute pulmonary findings. 5. Diffuse colonic diverticulosis without findings for acute diverticulitis. 6. Status  post cholecystectomy without biliary dilatation. 7. Surgical changes involving the lumbar spine without complicating features.   Aortic Atherosclerosis (ICD10-I70.0   Bilateral Carotid Doppler 09/09/2018: FINDINGS: Criteria: Quantification of carotid stenosis is based on velocity parameters that correlate the residual internal carotid diameter with NASCET-based stenosis levels, using the diameter of the distal internal carotid lumen as the denominator for stenosis measurement.   The following velocity measurements were obtained:   RIGHT   ICA:  Systolic 87 cm/sec, Diastolic 17 cm/sec   CCA:  81 cm/sec   SYSTOLIC ICA/CCA RATIO:  1.1   ECA:  103 cm/sec   LEFT   ICA:  Systolic 74 cm/sec, Diastolic 32 cm/sec   CCA:  77 cm/sec   SYSTOLIC ICA/CCA RATIO:  1.0   ECA:  54 cm/sec   Right Brachial SBP: 149   Left Brachial SBP: 145   RIGHT CAROTID ARTERY: No significant calcifications of the right common carotid artery. Intermediate waveform maintained. Moderate heterogeneous and partially calcified plaque at the right carotid bifurcation. No significant lumen shadowing. Low resistance waveform of the right ICA. No significant tortuosity.   RIGHT VERTEBRAL ARTERY: Antegrade flow with low resistance waveform.   LEFT CAROTID ARTERY: No significant calcifications of the left common carotid artery. Intermediate waveform maintained. Moderate heterogeneous and partially calcified plaque at the left carotid bifurcation. No significant lumen shadowing. Low resistance waveform of the left ICA. No significant tortuosity.   LEFT VERTEBRAL ARTERY:  Antegrade flow with low resistance waveform.   IMPRESSION: Color duplex indicates moderate heterogeneous and calcified plaque, with no hemodynamically significant stenosis by duplex criteria in the extracranial  cerebrovascular circulation.   EKG:  EKG is personally reviewed and interpreted. 11/25/2021: Sinus rhythm. PVC. Rate 65 bpm.  RBBB.  07/08/2021: Sinus rhythm, right bundle branch block.  05/28/2021: EKG was not ordered. 02/07/2021 (ED): Atrial fibrillation at 69 bpm. RBBB.  Recent Labs: 02/05/2021: ALT 11 02/14/2021: BUN 20; Creatinine, Ser 1.07; Hemoglobin 15.3; Platelets 262; Potassium 4.1; Sodium 136   Recent Lipid Panel    Component Value Date/Time   CHOL 123 02/07/2021 0204   TRIG 103 02/07/2021 0204   HDL 46 02/07/2021 0204   CHOLHDL 2.7 02/07/2021 0204   VLDL 21 02/07/2021 0204   LDLCALC 56 02/07/2021 0204     Risk Assessment/Calculations:          Physical Exam:    VS:  BP 110/80 (BP Location: Left Arm, Patient Position: Sitting, Cuff Size: Normal)   Pulse 65   Ht '6\' 1"'$  (1.854 m)   Wt 220 lb (99.8 kg)   SpO2 95%   BMI 29.03 kg/m     Wt Readings from Last 3 Encounters:  11/25/21 220 lb (99.8 kg)  11/18/21 220 lb (99.8 kg)  07/08/21 216 lb 9.6 oz (98.2 kg)     GEN: Well nourished, well developed in no acute distress HEENT: Normal NECK: No JVD; No carotid bruits LYMPHATICS: No lymphadenopathy CARDIAC: Rare ectopy RRR, no murmurs, rubs, gallops RESPIRATORY:  Clear to auscultation without rales, wheezing or rhonchi  ABDOMEN: Soft, non-tender, non-distended MUSCULOSKELETAL:  No edema; No deformity. Resting hand tremor.  SKIN: Warm and dry NEUROLOGIC:  Alert and oriented x 3 PSYCHIATRIC:  Normal affect   ASSESSMENT:    1. PVC's (premature ventricular contractions)   2. History of myocardial infarction   3. Coronary artery disease involving native coronary artery of native heart without angina pectoris   4. RBBB (right bundle branch block)   5. Parkinson's disease with dyskinesia without fluctuating manifestations     PLAN:    In order of problems listed above:  PVC (premature ventricular contraction) Status post PVC ablation.  Dr. Curt Bears.  He was on mexiletine which was back down to 150 mg twice a day but he was having too much stomach upset on this even though he was taking  the Prilosec.  He ended up having to stop this medication.  Of course, he is having more PVCs that are symptomatic now.  I would like for him to discuss this further with Dr. Curt Bears to see if there is any other medication strategy.  I did review with him that there may not be and reassured him that occasional PVCs should not cause any undue harm.  Some of his fatigue of course could be related to his Parkinson's.     History of myocardial infarction Continue with goal-directed medical therapy.  He is on aspirin and Plavix.  Seems reasonable to continue.  He is not having any bleeding issues.  After 1 year we could consider Plavix discontinuation.  This would be in January 2024.  Continue with low-dose metoprolol as well.  On Crestor high intensity dose.  Doing great.   Hyperlipidemia with target LDL less than 70 Excellent Crestor 20 mg once a day LDL 56 ALT 11.  Hemoglobin A1c 5.3 from outside labs.  Doing well without any myalgias.   Parkinson's disease ([place]) Does take a midodrine every day.  This helps support his blood pressure.  Also uses recumbent bike at home.  Excellent.  Energy has been decreasing.  He is going to be trying  for an aquatics class soon.   Coronary artery disease involving native coronary artery of native heart without angina pectoris Moderate disease noted on cardiac catheterization.  No PCI during non-STEMI.  Continue with goal-directed medical therapy.  See above for details. Follow-up: 6 months with APP. 1 year with me.  Medication Adjustments/Labs and Tests Ordered: Current medicines are reviewed at length with the patient today.  Concerns regarding medicines are outlined above.   Orders Placed This Encounter  Procedures   EKG 12-Lead   No orders of the defined types were placed in this encounter.  Patient Instructions  Medication Instructions:  Your physician recommends that you continue on your current medications as directed. Please refer to the Current  Medication list given to you today.  *If you need a refill on your cardiac medications before your next appointment, please call your pharmacy*   Lab Work: NONE If you have labs (blood work) drawn today and your tests are completely normal, you will receive your results only by: [place] (if you have MyChart) OR A paper copy in the mail If you have any lab test that is abnormal or we need to change your treatment, we will call you to review the results.   Testing/Procedures: Follow up with Dr. Curt Bears for PVC's.   Follow-Up: At [facility], you and your health needs are our priority.  As part of our continuing mission to provide you with exceptional heart care, we have created designated Provider Care Teams.  These Care Teams include your primary Cardiologist (physician) and Advanced Practice Providers (APPs -  Physician Assistants and Nurse Practitioners) who all work together to provide you with the care you need, when you need it.  We recommend signing up for the patient portal called "MyChart".  Sign up information is provided on this After Visit Summary.  MyChart is used to connect with patients for Virtual Visits (Telemedicine).  Patients are able to view lab/test results, encounter notes, upcoming appointments, etc.  Non-urgent messages can be sent to your provider as well.   To learn more about what you can do with MyChart, go to [email or web address].    Your next appointment:   6 month(s)  The format for your next appointment:   In Person  Provider:   Candee Furbish, MD      Important Information About Sugar         I,Rachel Rivera,acting as a scribe for Candee Furbish, MD.,have documented all relevant documentation on the behalf of Candee Furbish, MD,as directed by  Candee Furbish, MD while in the presence of Candee Furbish, MD.  I, Candee Furbish, MD, have reviewed all documentation for this visit. The documentation on 11/25/21 for the exam, diagnosis,  procedures, and orders are all accurate and complete.   Signed, Candee Furbish, MD  11/25/2021 11:33 AM    [facility]

## 2021-11-25 NOTE — Patient Instructions (Signed)
Medication Instructions:  Your physician recommends that you continue on your current medications as directed. Please refer to the Current Medication list given to you today.  *If you need a refill on your cardiac medications before your next appointment, please call your pharmacy*   Lab Work: NONE If you have labs (blood work) drawn today and your tests are completely normal, you will receive your results only by: [place] (if you have MyChart) OR A paper copy in the mail If you have any lab test that is abnormal or we need to change your treatment, we will call you to review the results.   Testing/Procedures: Follow up with Dr. Curt Bears for PVC's.   Follow-Up: At [facility], you and your health needs are our priority.  As part of our continuing mission to provide you with exceptional heart care, we have created designated Provider Care Teams.  These Care Teams include your primary Cardiologist (physician) and Advanced Practice Providers (APPs -  Physician Assistants and Nurse Practitioners) who all work together to provide you with the care you need, when you need it.  We recommend signing up for the patient portal called "MyChart".  Sign up information is provided on this After Visit Summary.  MyChart is used to connect with patients for Virtual Visits (Telemedicine).  Patients are able to view lab/test results, encounter notes, upcoming appointments, etc.  Non-urgent messages can be sent to your provider as well.   To learn more about what you can do with MyChart, go to [email or web address].    Your next appointment:   6 month(s)  The format for your next appointment:   In Person  Provider:   Candee Furbish, MD      Important Information About Sugar

## 2021-11-28 ENCOUNTER — Ambulatory Visit: Payer: Medicare HMO | Attending: Neurology | Admitting: Physical Therapy

## 2021-11-28 ENCOUNTER — Encounter: Payer: Self-pay | Admitting: Physical Therapy

## 2021-11-28 DIAGNOSIS — R2689 Other abnormalities of gait and mobility: Secondary | ICD-10-CM | POA: Insufficient documentation

## 2021-11-28 DIAGNOSIS — R2681 Unsteadiness on feet: Secondary | ICD-10-CM | POA: Insufficient documentation

## 2021-11-28 DIAGNOSIS — R293 Abnormal posture: Secondary | ICD-10-CM | POA: Diagnosis present

## 2021-11-28 DIAGNOSIS — G20A1 Parkinson's disease without dyskinesia, without mention of fluctuations: Secondary | ICD-10-CM | POA: Insufficient documentation

## 2021-11-28 DIAGNOSIS — R29818 Other symptoms and signs involving the nervous system: Secondary | ICD-10-CM | POA: Insufficient documentation

## 2021-11-28 DIAGNOSIS — M6281 Muscle weakness (generalized): Secondary | ICD-10-CM | POA: Insufficient documentation

## 2021-11-28 NOTE — Therapy (Signed)
OUTPATIENT PHYSICAL THERAPY NEURO EVALUATION   Patient Name: Joshua [name] MRN: [id number] DOB:May 23, 1945, 76 y.o., male Today's Date: 11/28/2021   PCP: Ginger Organ., MD   REFERRING PROVIDER:  Carles Collet Eustace Quail, DO  END OF SESSION:  PT End of Session - 11/28/21 1330     Visit Number 1    Number of Visits 17    Date for PT Re-Evaluation 02/26/22    Authorization Type [employer]    PT Start Time 1232    PT Stop Time 1315    PT Time Calculation (min) 43 min    Equipment Utilized During Treatment Gait belt    Activity Tolerance Patient tolerated treatment well    Behavior During Therapy WFL for tasks assessed/performed             Past Medical History:  Diagnosis Date   Allergic rhinitis    Allergy    Anal fissure    Anxiety    from chronic pain from surgery- on Cymbalta   Arthritis    Asthma    Barrett's esophagus 03/29/2014   Carotid artery disease ([place])    Carotid Doppler normal August, 2007   Cataract    Chest pain    Coronaries normal 1996 /  nuclear..06/2002..normal...EF  56% /  stress echo.. May, 2011.... no  scar or ischemia... rate related RBBB   Diverticulosis    Dyslipidemia    Ejection fraction    EF 60%, stress echo, May, 2011   GERD (gastroesophageal reflux disease)    Headache    History of loop recorder    has since 04/04/15   HTN (hypertension)    takes Metoprolol for PVC control   Hx of colonic polyps    adenomatous   Hx of colonoscopy    Hyperlipidemia    IFG (impaired fasting glucose)    Palpitations    Benign PVCs   Parkinson disease    Prostate cancer (HCC)    RBBB (right bundle branch block)    rate related   Shingles    Stroke [facility])    TIA   TIA (transient ischemic attack) 02/2015   Per pt, had 2 strokes   Tremor    Hand tremor   Vertigo    Past Surgical History:  Procedure Laterality Date   BACK SURGERY  2002,2009   x 6   CHOLECYSTECTOMY  11/30/2012   with IOC   COLONOSCOPY     ELECTROPHYSIOLOGIC  STUDY N/A 09/26/2015   Procedure: V Tach Ablation (PVC);  Surgeon: Will Meredith Leeds, MD;  Location: [place] [facility];  Service: Cardiovascular;  Laterality: N/A;   EP IMPLANTABLE DEVICE N/A 04/04/2015   Procedure: Loop Recorder Insertion;  Surgeon: Will Meredith Leeds, MD;  Location: [place] [facility];  Service: Cardiovascular;  Laterality: N/A;   HERNIA REPAIR     laprascopic   KNEE SURGERY     DECEMBER 2017,LEFT KNEE SCOPED   LEFT HEART CATH AND CORONARY ANGIOGRAPHY N/A 02/05/2021   Procedure: LEFT HEART CATH AND CORONARY ANGIOGRAPHY;  Surgeon: Wellington Hampshire, MD;  Location: [place] [facility];  Service: Cardiovascular;  Laterality: N/A;   NECK SURGERY  2002   POLYPECTOMY     ROTATOR CUFF REPAIR Left    TEE WITHOUT CARDIOVERSION N/A 04/04/2015   Procedure: TRANSESOPHAGEAL ECHOCARDIOGRAM (TEE);  Surgeon: Lelon Perla, MD;  Location: [facility];  Service: Cardiovascular;  Laterality: N/A;   TRIGGER FINGER RELEASE Left 12/28/2019   Procedure: RELEASE TRIGGER  FINGER/A-1 PULLEY THUMB, MIDDLE AND RING;  Surgeon: Daryll Brod, MD;  Location: [place];  Service: Orthopedics;  Laterality: Left;  FAB   UPPER GASTROINTESTINAL ENDOSCOPY     V Tach ablation  09/26/2015   Patient Active Problem List   Diagnosis Date Noted   Coronary artery disease involving native coronary artery of native heart without angina pectoris 05/28/2021   History of myocardial infarction 02/05/2021   Parkinson's disease    Malignant neoplasm of prostate ([place]) 11/01/2017   Essential tremor 12/06/2015   PVC (premature ventricular contraction) 40/98/1191   Embolic stroke involving left middle cerebral artery ([place]) 04/07/2015   Acute CVA (cerebrovascular accident) ([place]) 03/06/2015   Stroke (cerebrum) ([place]) 03/05/2015   CVA (cerebral infarction) 03/05/2015   Weakness of right upper extremity    Tremor    Ejection fraction    Vertigo    Sleep apnea    Palpitations    Hyperlipidemia with target  LDL less than 70    GERD (gastroesophageal reflux disease)    HTN (hypertension)    Chest pain    RBBB (right bundle branch block)    Ejection fraction    Carotid artery disease ([place])     ONSET DATE:  11/18/2021  REFERRING DIAG: G20.A1 (ICD-10-CM) - Parkinson's disease without dyskinesia or fluctuating manifestations   THERAPY DIAG:  Unsteadiness on feet  Other abnormalities of gait and mobility  Other symptoms and signs involving the nervous system  Abnormal posture  Muscle weakness (generalized)  Rationale for Evaluation and Treatment: Rehabilitation  SUBJECTIVE:                                                                                                                                                                                             SUBJECTIVE STATEMENT: Was last here in 2020. Wants to see how he has progressed. Very weak in the mornings and late afternoons. Has some exercises for his back and from his previous bout of therapy. Has had some falls in the past, but has not had one in a while. Uses his cane most of the time and has a rollator at home as well as a transport chair. Does not use any kind of AD in the house. Feels a lot more secure with the rollator, esp when having to walk a longer distance. Feels pretty off balance and unstable a lot. Has to be careful with turns. In close quarter, it is hard to maneuver the feet. Doesn't move his R arm a great deal. Tripped over a couple things with his R foot.  Pt accompanied by: self  PERTINENT HISTORY: PMH:  PD, Anxiety, HTN, HLD, multiple back surgeries/fusion neck and lumbar spine,  NSTEMI, 01/2020, hx of multiple syncopal episodes  PAIN:  Are you having pain? Yes: NPRS scale: 6/10 Pain location: Back, both knees  Pain description: Aching Aggravating factors: Walking > 5 minutes.  Relieving factors: Sitting   Has a hx of 7 back surgeries, has sore knees and gets injections in both. Also soreness in R hand and  if he moves the R hand wrong it will be sharp. "Whole body will hurt from time to time"   PRECAUTIONS: Fall  WEIGHT BEARING RESTRICTIONS: No  FALLS: Has patient fallen in last 6 months? No  LIVING ENVIRONMENT: Lives with: lives with their spouse Lives in: House/apartment - [place]: No Has following equipment at home: Single point cane, Environmental consultant - 4 wheeled, Grab bars, and transport chair Has a lift chair and a lift bed.   PLOF: Independent Reports things like using a screwdriver or trying to get a nail in takes him a lot longer now. Wife has to help him a lot with these tasks.   PATIENT GOALS: Wants to work on his balance.   OBJECTIVE:   COGNITION: Overall cognitive status: Within functional limits for tasks assessed   SENSATION: Light touch: Impaired  and tingling in RLE and notices some tremors in his R foot, reports not much feeling in L knee from knee down due to hx of back surgeries. Light touch was able to detect bilat, less pronounced in LLE.  Proprioception: WFL and at bilat ankles   COORDINATION: Heel to shin: impaired with RLE   MUSCLE TONE: RLE: Rigidity and RUE    POSTURE: rounded shoulders, forward head, and flexed trunk   LOWER EXTREMITY ROM:       LOWER EXTREMITY MMT:    MMT Right Eval Left Eval  Hip flexion 4+/5 4+/5  Hip extension    Hip abduction    Hip adduction    Hip internal rotation    Hip external rotation    Knee flexion 4+/5 5/5  Knee extension 4/5 4+/5  Ankle dorsiflexion 5/5 4+/5  Ankle plantarflexion    Ankle inversion    Ankle eversion    (Blank rows = not tested)  BED MOBILITY:  Pt has a lift bed, no trouble getting in and out.   TRANSFERS: Assistive device utilized: None  Sit to stand: SBA Stand to sit: SBA Can perform without UE support   GAIT: Gait pattern: step through pattern, decreased arm swing- Right, decreased stride length, decreased trunk rotation, and trunk flexed Distance walked: Clinic distances   Assistive device utilized: Single point cane and None Level of assistance: SBA Comments: Pt ambulates into session with SPC, ambulates some distances during session with no AD   With gait with head motions, pt more unsteady with head nods, needing CGA. Pt reporting more difficulties with turns to the L.    FUNCTIONAL TESTS:  5 times sit to stand: 14.1 seconds with no UE support, pt with some bracing against chair at times.  10 meter walk test: 14.2 seconds with cane =2.31 ft/sec     M-CTSIB  Condition 1: Firm Surface, EO 30 Sec, Normal Sway  Condition 2: Firm Surface, EC 30 Sec, Mild Sway  Condition 3: Foam Surface, EO 30 Sec, Mild Sway  Condition 4: Foam Surface, EC 5.4 Sec     [facility] PT Assessment - 11/28/21 1257       Standardized Balance Assessment   Standardized Balance Assessment Mini-BESTest;Timed  Up and Go Test      Mini-BESTest   Sit To Stand Normal: Comes to stand without use of hands and stabilizes independently.    Rise to Toes < 3 s.    Stand on one leg (left) Moderate: < 20 s   1 second, 5 seconds   Stand on one leg (right) Moderate: < 20 s   5 seconds, 8 seconds   Stand on one leg - lowest score 1    Stance - Feet together, eyes open, firm surface  Normal: 30s    Stance - Feet together, eyes closed, foam surface  Moderate: < 30s   5.4 seconds   Walk with head turns - Horizontal Moderate: performs head turns with reduction in gait speed.    Timed UP & GO with Dual Task Normal: No noticeable change in sitting, standing or walking while backward counting when compared to TUG without      Timed Up and Go Test   Normal TUG (seconds) 13.4   with cane, 11.3 with no AD   Manual TUG (seconds) 10.6    Cognitive TUG (seconds) 10.44            Will finish miniBEST at next session     TODAY'S TREATMENT:                                                                                                                               N/A during eval   PATIENT  EDUCATION: Education details: Clinical findings, POC, will talk with aquatic PT about trying to get pt into aquatics (pt has enjoyed this in the past and also has access to a pool), PT to send referral request for OT due to RUE rigidity and difficulties with fine motor tasks/coordination and tremors.  Person educated: Patient Education method: Explanation Education comprehension: verbalized understanding  HOME EXERCISE PROGRAM: Will review from previous bout of therapy and update as appropriate. Pt reports will sometimes perform standing PWR moves   GOALS: Goals reviewed with patient? Yes  SHORT TERM GOALS: Target date: 12/26/2021  Pt will be independent with initial HEP for PD specific deficits in order to build upon functional gains made in therapy. Baseline: Goal status: INITIAL  2.   Pt will verbalize understanding of fall prevention in the home environment.  Baseline:  Goal status: INITIAL  3.  Pt will finish assessment of miniBEST with LTG written. Baseline:  Goal status: INITIAL  4.  Pt will improve gait speed with LRAD to at least 2.7 ft/sec in order to demo improved community mobility.   Baseline: 14.2 seconds with cane =2.31 ft/sec Goal status: INITIAL    LONG TERM GOALS: Target date: 01/23/2022  Pt will be independent with final HEP for PD specific deficits in order to build upon functional gains made in therapy. Baseline:  Goal status: INITIAL  2.  miniBEST goal to be written as appropriate.  Baseline:  Goal status: INITIAL  3.  Pt will verbalize understanding of local Parkinson's disease resources, including options for continued community fitness. Baseline:  Goal status: INITIAL  4.  Pt will improve condition 4 of mCTSIB to at least 15 seconds in order to demo improved vestibular input for balance.  Baseline: 5.4 seconds Goal status: INITIAL  5.  Pt will perform at least 10 sit <> stand reps with proper technique and no episodes of retropulsion in order  to demo improved transfer efficiency.  Baseline:  Goal status: INITIAL  6.   Pt will improve gait speed with LRAD to at least 3.0 ft/sec in order to demo improved community mobility.  Baseline: 14.2 seconds with cane =2.31 ft/sec Goal status: INITIAL  ASSESSMENT:  CLINICAL IMPRESSION: Patient is a 76 year old male referred to Neuro OPPT for PD.   Pt's PMH is significant for: PD, Anxiety, HTN, HLD, multiple back surgeries/fusion neck and lumbar spine,  NSTEMI, 01/2020, hx of multiple syncopal episodes. The following deficits were present during the exam: impaired timing/coordination of gait, impaired balance, bradykinesia, rigidity, decr activity tolerance, gait abnormalities, decr strength, postural instability, low back (hx of multiple surgeries and knee pain). Pt's gait speed indicates a limited community ambulator with use of cane. With condition 4 of mCTSIB, pt with decr vestibular input for balance. Will finish miniBEST at next session.  Pt would also benefit from aquatic therapy to work on balance, functional strengthening and decr pain. Pt would benefit from skilled PT to address these impairments and functional limitations to maximize functional mobility independence and decr fall risk.    OBJECTIVE IMPAIRMENTS: Abnormal gait, decreased activity tolerance, decreased balance, decreased coordination, decreased mobility, difficulty walking, decreased ROM, decreased strength, impaired flexibility, impaired tone, postural dysfunction, and pain.   ACTIVITY LIMITATIONS: bending, standing, stairs, transfers, bed mobility, and locomotion level  PARTICIPATION LIMITATIONS: community activity and yard work  PERSONAL FACTORS: Age, Behavior pattern, Past/current experiences, Time since onset of injury/illness/exacerbation, and 3+ comorbidities: PD, Anxiety, HTN, HLD, multiple back surgeries/fusion neck and lumbar spine,  NSTEMI, 01/2020, hx of multiple syncopal episodes  are also affecting patient's  functional outcome.   REHAB POTENTIAL: Good  CLINICAL DECISION MAKING: Evolving/moderate complexity  EVALUATION COMPLEXITY: Moderate  PLAN:  PT FREQUENCY: 2x/week  PT DURATION: 12 weeks  PLANNED INTERVENTIONS: Therapeutic exercises, Therapeutic activity, Neuromuscular re-education, Balance training, Gait training, Patient/Family education, Self Care, Stair training, Vestibular training, DME instructions, Aquatic Therapy, Manual therapy, and Re-evaluation  PLAN FOR NEXT SESSION: Finish miniBEST and write goal. Review prior HEP and update. Work on balance, turning. Any word for aquatic PT?    Arliss Journey, PT, DPT  11/28/2021, 1:31 PM

## 2021-12-01 ENCOUNTER — Telehealth: Payer: Self-pay | Admitting: Neurology

## 2021-12-01 NOTE — Telephone Encounter (Signed)
Patient said he received approval to have the Biopsy test. He would like to know when he can get it scheduled.

## 2021-12-02 NOTE — Telephone Encounter (Signed)
LMOM to sch Biopsy

## 2021-12-03 ENCOUNTER — Telehealth: Payer: Self-pay | Admitting: Physical Therapy

## 2021-12-03 ENCOUNTER — Other Ambulatory Visit: Payer: Self-pay

## 2021-12-03 DIAGNOSIS — R29898 Other symptoms and signs involving the musculoskeletal system: Secondary | ICD-10-CM

## 2021-12-03 DIAGNOSIS — G20A1 Parkinson's disease without dyskinesia, without mention of fluctuations: Secondary | ICD-10-CM

## 2021-12-03 NOTE — Telephone Encounter (Signed)
OT order has been sent

## 2021-12-03 NOTE — Telephone Encounter (Signed)
Patient is sch for 01-16-22

## 2021-12-03 NOTE — Telephone Encounter (Signed)
Dr. Carles Collet,  Jennye Moccasin was evaluated by PT at [facility]. The patient would benefit from an OT evaluation for RUE rigidity and difficulties with fine motor tasks/coordination due to PD.   If you agree, please place an order in [facility] workque in [facility] or fax the order to [phone]. Thank you, Janann August, PT, DPT 12/03/21 8:14 AM    [facility] [address] [place], [place]  [address] Phone:  [phone] Fax:  [phone]

## 2021-12-08 ENCOUNTER — Ambulatory Visit: Payer: Medicare HMO | Admitting: Physical Therapy

## 2021-12-08 DIAGNOSIS — R293 Abnormal posture: Secondary | ICD-10-CM

## 2021-12-08 DIAGNOSIS — R2689 Other abnormalities of gait and mobility: Secondary | ICD-10-CM

## 2021-12-08 DIAGNOSIS — R2681 Unsteadiness on feet: Secondary | ICD-10-CM

## 2021-12-08 DIAGNOSIS — R29818 Other symptoms and signs involving the nervous system: Secondary | ICD-10-CM

## 2021-12-08 DIAGNOSIS — M6281 Muscle weakness (generalized): Secondary | ICD-10-CM

## 2021-12-08 NOTE — Therapy (Signed)
OUTPATIENT PHYSICAL THERAPY NEURO TREATMENT   Patient Name: Joshua [name] MRN: [id number] DOB:October 27, 1945, 76 y.o., male Today's Date: 12/08/2021   PCP: Ginger Organ., MD   REFERRING PROVIDER:  Carles Collet Eustace Quail, DO  END OF SESSION:  PT End of Session - 12/08/21 1311     Visit Number 2    Number of Visits 17    Date for PT Re-Evaluation 02/26/22   due to delay in scheduling   Authorization Type [employer]    PT Start Time 1309    PT Stop Time 1355    PT Time Calculation (min) 46 min    Equipment Utilized During Treatment Gait belt    Activity Tolerance Patient tolerated treatment well    Behavior During Therapy [facility] for tasks assessed/performed              Past Medical History:  Diagnosis Date   Allergic rhinitis    Allergy    Anal fissure    Anxiety    from chronic pain from surgery- on Cymbalta   Arthritis    Asthma    Barrett's esophagus 03/29/2014   Carotid artery disease ([place])    Carotid Doppler normal August, 2007   Cataract    Chest pain    Coronaries normal 1996 /  nuclear..06/2002..normal...EF  56% /  stress echo.. May, 2011.... no  scar or ischemia... rate related RBBB   Diverticulosis    Dyslipidemia    Ejection fraction    EF 60%, stress echo, May, 2011   GERD (gastroesophageal reflux disease)    Headache    History of loop recorder    has since 04/04/15   HTN (hypertension)    takes Metoprolol for PVC control   Hx of colonic polyps    adenomatous   Hx of colonoscopy    Hyperlipidemia    IFG (impaired fasting glucose)    Palpitations    Benign PVCs   Parkinson disease    Prostate cancer (HCC)    RBBB (right bundle branch block)    rate related   Shingles    Stroke [facility])    TIA   TIA (transient ischemic attack) 02/2015   Per pt, had 2 strokes   Tremor    Hand tremor   Vertigo    Past Surgical History:  Procedure Laterality Date   BACK SURGERY  2002,2009   x 6   CHOLECYSTECTOMY  11/30/2012   with IOC    COLONOSCOPY     ELECTROPHYSIOLOGIC STUDY N/A 09/26/2015   Procedure: V Tach Ablation (PVC);  Surgeon: Will Meredith Leeds, MD;  Location: [place] [facility];  Service: Cardiovascular;  Laterality: N/A;   EP IMPLANTABLE DEVICE N/A 04/04/2015   Procedure: Loop Recorder Insertion;  Surgeon: Will Meredith Leeds, MD;  Location: [place] [facility];  Service: Cardiovascular;  Laterality: N/A;   HERNIA REPAIR     laprascopic   KNEE SURGERY     DECEMBER 2017,LEFT KNEE SCOPED   LEFT HEART CATH AND CORONARY ANGIOGRAPHY N/A 02/05/2021   Procedure: LEFT HEART CATH AND CORONARY ANGIOGRAPHY;  Surgeon: Wellington Hampshire, MD;  Location: [place] [facility];  Service: Cardiovascular;  Laterality: N/A;   NECK SURGERY  2002   POLYPECTOMY     ROTATOR CUFF REPAIR Left    TEE WITHOUT CARDIOVERSION N/A 04/04/2015   Procedure: TRANSESOPHAGEAL ECHOCARDIOGRAM (TEE);  Surgeon: Lelon Perla, MD;  Location: [facility] ENDOSCOPY;  Service: Cardiovascular;  Laterality: N/A;   TRIGGER FINGER RELEASE  Left 12/28/2019   Procedure: RELEASE TRIGGER FINGER/A-1 PULLEY THUMB, MIDDLE AND RING;  Surgeon: Daryll Brod, MD;  Location: [place];  Service: Orthopedics;  Laterality: Left;  FAB   UPPER GASTROINTESTINAL ENDOSCOPY     V Tach ablation  09/26/2015   Patient Active Problem List   Diagnosis Date Noted   Coronary artery disease involving native coronary artery of native heart without angina pectoris 05/28/2021   History of myocardial infarction 02/05/2021   Parkinson's disease    Malignant neoplasm of prostate ([place]) 11/01/2017   Essential tremor 12/06/2015   PVC (premature ventricular contraction) 28/76/8115   Embolic stroke involving left middle cerebral artery ([place]) 04/07/2015   Acute CVA (cerebrovascular accident) ([place]) 03/06/2015   Stroke (cerebrum) ([place]) 03/05/2015   CVA (cerebral infarction) 03/05/2015   Weakness of right upper extremity    Tremor    Ejection fraction    Vertigo    Sleep apnea     Palpitations    Hyperlipidemia with target LDL less than 70    GERD (gastroesophageal reflux disease)    HTN (hypertension)    Chest pain    RBBB (right bundle branch block)    Ejection fraction    Carotid artery disease ([place])     ONSET DATE:  11/18/2021  REFERRING DIAG: G20.A1 (ICD-10-CM) - Parkinson's disease without dyskinesia or fluctuating manifestations   THERAPY DIAG:  Unsteadiness on feet  Other symptoms and signs involving the nervous system  Other abnormalities of gait and mobility  Muscle weakness (generalized)  Abnormal posture  Rationale for Evaluation and Treatment: Rehabilitation  SUBJECTIVE:                                                                                                                                                                                             SUBJECTIVE STATEMENT: No changes since he was last here. No falls. Had an almost fall when getting up after taking his Ibuprofen and Ambien. Had a good Thanksgiving. Wearing a L anterior AFO due to weakness from hx of back surgeries. Does this in the winter.   Pt accompanied by: self  PERTINENT HISTORY: PMH: PD, Anxiety, HTN, HLD, multiple back surgeries/fusion neck and lumbar spine,  NSTEMI, 01/2020, hx of multiple syncopal episodes  PAIN:  Will occasionally have pain in L knee if he steps a certain way.   Has a hx of 7 back surgeries, has sore knees and gets injections in both. Also soreness in R hand and if he moves the R hand wrong it will be sharp. "Whole body will hurt from time to time"   PRECAUTIONS: Fall  WEIGHT BEARING RESTRICTIONS: No  FALLS: Has patient fallen in last 6 months? No  LIVING ENVIRONMENT: Lives with: lives with their spouse Lives in: House/apartment - [place]: No Has following equipment at home: Single point cane, Environmental consultant - 4 wheeled, Grab bars, and transport chair Has a lift chair and a lift bed.   PLOF: Independent Reports things like using a  screwdriver or trying to get a nail in takes him a lot longer now. Wife has to help him a lot with these tasks.   PATIENT GOALS: Wants to work on his balance.   OBJECTIVE:     TODAY'S TREATMENT:          GAIT: Gait pattern: step through pattern, decreased arm swing- Right, decreased stride length, decreased trunk rotation, and trunk flexed Distance walked: Clinic distances  Assistive device utilized: Single point cane and None Level of assistance: SBA Comments: Pt ambulates into session with SPC, ambulates some distances during session with no AD                       [facility] PT Assessment - 12/08/21 1308       Mini-BESTest   Sit To Stand Normal: Comes to stand without use of hands and stabilizes independently.    Rise to Toes < 3 s.    Stand on one leg (left) Moderate: < 20 s   1 second, 5 seconds   Stand on one leg (right) Moderate: < 20 s   5 seconds, 8 seconds   Stand on one leg - lowest score 1    Compensatory Stepping Correction - Forward Normal: Recovers independently with a single, large step (second realignement is allowed).    Compensatory Stepping Correction - Backward Normal: Recovers independently with a single, large step    Compensatory Stepping Correction - Left Lateral Moderate: Several steps to recover equilibrium   2 steps   Compensatory Stepping Correction - Right Lateral Moderate: Several steps to recover equilibrium   2 steps   Stepping Corredtion Lateral - lowest score 1    Stance - Feet together, eyes open, firm surface  Normal: 30s    Stance - Feet together, eyes closed, foam surface  Moderate: < 30s   5.4 seconds   Incline - Eyes Closed Normal: Stands independently 30s and aligns with gravity    Change in Gait Speed Moderate: Unable to change walking speed or signs of imbalance    Walk with head turns - Horizontal Moderate: performs head turns with reduction in gait speed.    Walk with pivot turns Moderate:Turns with feet close SLOW (>4 steps) with good  balance.    Step over obstacles Normal: Able to step over box with minimal change of gait speed and with good balance.    Timed UP & GO with Dual Task Normal: No noticeable change in sitting, standing or walking while backward counting when compared to TUG without    Mini-BEST total score 20            Provided information/handout for aquatic therapy. Pt unable to start this week due to being out of town. Pt plans to look at his schedule to see if he can start next week instead.    NMR: Pt performs PWR! Moves in standing position x 10 reps - performed with chair in front of pt    PWR! Up for improved posture - cues for 5 second holds for posture   PWR! Rock for improved  weighshifting - cues for slowed pace, looking up at hands   PWR! Twist for improved trunk rotation - cues to use legs to help pivot and twist   PWR! Step for improved step initiation - cues for incr step height and looking at hands  Pt had previously performed years ago as HEP, but needing a review for technique, slowing down pace, and larger amplitude movement patterns. Educated on how each exercise relates to function.     Access Code: [email or web address] URL: [email or web address] Date: 12/08/2021 Prepared by: Janann August  Initiated HEP for balance, see [place] for further details.   Exercises - Heel Toe Raises with Counter Support  - 1 x daily - 5 x weekly - 1-2 sets - 10 reps - Standing Single Leg Stance with Counter Support  - 1 x daily - 5 x weekly - 3 sets - 10-15 hold - Standing Balance with Eyes Closed on Foam  - 1 x daily - 5 x weekly - 3 sets - 30 hold - Romberg Stance on Foam Pad with Head Rotation  - 1 x daily - 5 x weekly - 2 sets - 10 reps - with slight space between feet 2 sets of 10 reps head turns, 2 sets of 10 reps head nods      PATIENT EDUCATION: Education details: Results of miniBEST, information for aquatic therapy, initial HEP  Person educated: Patient Education method:  Explanation, Demonstration, Verbal cues, and Handouts Education comprehension: verbalized understanding and returned demonstration  HOME EXERCISE PROGRAM: Standing PWR moves  Access Code: [email or web address] URL: [email or web address] Date: 12/08/2021 Prepared by: [place] with Counter Support  - 1 x daily - 5 x weekly - 1-2 sets - 10 reps - Standing Single Leg Stance with Counter Support  - 1 x daily - 5 x weekly - 3 sets - 10-15 hold - Standing Balance with Eyes Closed on Foam  - 1 x daily - 5 x weekly - 3 sets - 30 hold - Romberg Stance on Foam Pad with Head Rotation and Head Nods  - 1 x daily - 5 x weekly - 2 sets - 10 reps    GOALS: Goals reviewed with patient? Yes  SHORT TERM GOALS: Target date: 12/26/2021  Pt will be independent with initial HEP for PD specific deficits in order to build upon functional gains made in therapy. Baseline: Goal status: INITIAL  2.   Pt will verbalize understanding of fall prevention in the home environment.  Baseline:  Goal status: INITIAL  3.  Pt will finish assessment of miniBEST with LTG written. Baseline: 20/28 Goal status: MET  4.  Pt will improve gait speed with LRAD to at least 2.7 ft/sec in order to demo improved community mobility.   Baseline: 14.2 seconds with cane =2.31 ft/sec Goal status: INITIAL    LONG TERM GOALS: Target date: 01/23/2022  Pt will be independent with final HEP for PD specific deficits in order to build upon functional gains made in therapy. Baseline:  Goal status: INITIAL  2.  Pt will improve miniBEST to at least a 23/28 in order to demo decr fall risk.  Baseline: 20/28 Goal status: INITIAL  3.  Pt will verbalize understanding of local Parkinson's disease resources, including options for continued community fitness. Baseline:  Goal status: INITIAL  4.  Pt will improve condition 4 of mCTSIB to at least 15 seconds in order to demo improved vestibular input  for balance.  Baseline: 5.4 seconds Goal status:  INITIAL  5.  Pt will perform at least 10 sit <> stand reps with proper technique and no episodes of retropulsion in order to demo improved transfer efficiency.  Baseline:  Goal status: INITIAL  6.   Pt will improve gait speed with LRAD to at least 3.0 ft/sec in order to demo improved community mobility.  Baseline: 14.2 seconds with cane =2.31 ft/sec Goal status: INITIAL  ASSESSMENT:  CLINICAL IMPRESSION: Performed the miniBEST today with pt scoring a 20/28, indicating an incr fall risk. LTG updated. Provided information for aquatic therapy, but pt unable to start this week. Remainder of session focused on reviewing prior HEP and adding balance. Pt needing cues during standing PWR moves to slow pace down and to work on larger amplitude movements, with pt responding to these cues well. Added corner balance with work on compliant surfaces, SLS, and weight shifting. Pt tolerated session well, will continue to progress towards LTGs.    OBJECTIVE IMPAIRMENTS: Abnormal gait, decreased activity tolerance, decreased balance, decreased coordination, decreased mobility, difficulty walking, decreased ROM, decreased strength, impaired flexibility, impaired tone, postural dysfunction, and pain.   ACTIVITY LIMITATIONS: bending, standing, stairs, transfers, bed mobility, and locomotion level  PARTICIPATION LIMITATIONS: community activity and yard work  PERSONAL FACTORS: Age, Behavior pattern, Past/current experiences, Time since onset of injury/illness/exacerbation, and 3+ comorbidities: PD, Anxiety, HTN, HLD, multiple back surgeries/fusion neck and lumbar spine,  NSTEMI, 01/2020, hx of multiple syncopal episodes  are also affecting patient's functional outcome.   REHAB POTENTIAL: Good  CLINICAL DECISION MAKING: Evolving/moderate complexity  EVALUATION COMPLEXITY: Moderate  PLAN:  PT FREQUENCY: 2x/week  PT DURATION: 12 weeks  PLANNED  INTERVENTIONS: Therapeutic exercises, Therapeutic activity, Neuromuscular re-education, Balance training, Gait training, Patient/Family education, Self Care, Stair training, Vestibular training, DME instructions, Aquatic Therapy, Manual therapy, and Re-evaluation  PLAN FOR NEXT SESSION: Any to HEP as needed. Review standing PWR moves. [employer]. Work on balance, turning. SLS, unlevel surfaces, reactive stepping. Any word for aquatic PT?    Arliss Journey, PT, DPT  12/08/2021, 2:01 PM

## 2021-12-08 NOTE — Patient Instructions (Addendum)
  Aquatic Therapy: What to Expect!  Where:  [facility] [address] [place], [place]  [address] [phone]  NOTE:  You will receive an automated phone message reminding you of your appointment and it will say the appointment is at the Rehab Center on [address]  We are working to fix this- just know that you will meet us at the pool!  How to Prepare: Please make sure you drink 8 ounces of water about one hour prior to your pool session A caregiver MUST attend the entire session with the patient.  The caregiver will be responsible for assisting with dressing as well as any toileting needs.  If the patient will be doing a home program this should likely be the person who will assist as well.  Patients must wear either their street shoes or pool shoes until they are ready to enter the pool with the therapist.  Patients must also wear either street shoes or pool shoes once exiting the pool to walk to the locker room.  This will helps us prevent slips and falls.  Please arrive 15 minutes early to prepare for your pool therapy session Sign in at the front desk on the clipboard marked for [place] You may use the locker rooms on your right and then enter directly into the recreation pool (NOT the competition pool) Please make sure to attend to any toileting needs prior to entering the pool Please be dressed in your swim suit and on the pool deck at least 5 minutes before your appointment Once on the pool deck your therapist will ask you to sign the Patient  Consent and Assignment of Benefits form Your therapist may take your blood pressure prior to, during and after your session if indicated  About the pool  and parking: Entering the pool Your therapist will assist you; there are 2 ways to enter:  stairs with railings or with a chair lift.   Your therapist will determine the most appropriate way for you. Water temperature is usually between 86-87 degrees There  may be other swimmers in the pool at the same time Parking is free.   Contact Info:     Appointments: [facility]  All sessions are 45 minutes   [address]  [address]     Please call the [facility] if   [place], [place]   [address]    you need to cancel or reschedule an appointment.  [phone]       

## 2021-12-10 ENCOUNTER — Encounter: Payer: Self-pay | Admitting: Physical Therapy

## 2021-12-10 ENCOUNTER — Ambulatory Visit: Payer: Medicare HMO | Admitting: Physical Therapy

## 2021-12-10 DIAGNOSIS — R29818 Other symptoms and signs involving the nervous system: Secondary | ICD-10-CM

## 2021-12-10 DIAGNOSIS — R2681 Unsteadiness on feet: Secondary | ICD-10-CM

## 2021-12-10 DIAGNOSIS — M6281 Muscle weakness (generalized): Secondary | ICD-10-CM

## 2021-12-10 DIAGNOSIS — R2689 Other abnormalities of gait and mobility: Secondary | ICD-10-CM

## 2021-12-10 NOTE — Therapy (Signed)
OUTPATIENT PHYSICAL THERAPY NEURO TREATMENT   Patient Name: Joshua [name] MRN: [id number] DOB:[birth year], 76 y.o., male Today's Date: 12/10/2021   PCP: Ginger Organ., MD   REFERRING PROVIDER:  Carles Collet Eustace Quail, DO  END OF SESSION:  PT End of Session - 12/10/21 1107     Visit Number 3    Number of Visits 17    Date for PT Re-Evaluation 02/26/22   due to delay in scheduling   Authorization Type [employer]    PT Start Time 1105    PT Stop Time 1145    PT Time Calculation (min) 40 min    Equipment Utilized During Treatment Gait belt    Activity Tolerance Patient tolerated treatment well    Behavior During Therapy [facility] for tasks assessed/performed              Past Medical History:  Diagnosis Date   Allergic rhinitis    Allergy    Anal fissure    Anxiety    from chronic pain from surgery- on Cymbalta   Arthritis    Asthma    Barrett's esophagus 03/29/2014   Carotid artery disease ([place])    Carotid Doppler normal August, 2007   Cataract    Chest pain    Coronaries normal 1996 /  nuclear..06/2002..normal...EF  56% /  stress echo.. May, 2011.... no  scar or ischemia... rate related RBBB   Diverticulosis    Dyslipidemia    Ejection fraction    EF 60%, stress echo, May, 2011   GERD (gastroesophageal reflux disease)    Headache    History of loop recorder    has since 04/04/15   HTN (hypertension)    takes Metoprolol for PVC control   Hx of colonic polyps    adenomatous   Hx of colonoscopy    Hyperlipidemia    IFG (impaired fasting glucose)    Palpitations    Benign PVCs   Parkinson disease    Prostate cancer (HCC)    RBBB (right bundle branch block)    rate related   Shingles    Stroke [facility])    TIA   TIA (transient ischemic attack) 02/2015   Per pt, had 2 strokes   Tremor    Hand tremor   Vertigo    Past Surgical History:  Procedure Laterality Date   BACK SURGERY  2002,2009   x 6   CHOLECYSTECTOMY  11/30/2012   with IOC    COLONOSCOPY     ELECTROPHYSIOLOGIC STUDY N/A 09/26/2015   Procedure: V Tach Ablation (PVC);  Surgeon: Will Meredith Leeds, MD;  Location: [place] [facility];  Service: Cardiovascular;  Laterality: N/A;   EP IMPLANTABLE DEVICE N/A 04/04/2015   Procedure: Loop Recorder Insertion;  Surgeon: Will Meredith Leeds, MD;  Location: [place] [facility];  Service: Cardiovascular;  Laterality: N/A;   HERNIA REPAIR     laprascopic   KNEE SURGERY     DECEMBER 2017,LEFT KNEE SCOPED   LEFT HEART CATH AND CORONARY ANGIOGRAPHY N/A 02/05/2021   Procedure: LEFT HEART CATH AND CORONARY ANGIOGRAPHY;  Surgeon: Wellington Hampshire, MD;  Location: [place] [facility];  Service: Cardiovascular;  Laterality: N/A;   NECK SURGERY  2002   POLYPECTOMY     ROTATOR CUFF REPAIR Left    TEE WITHOUT CARDIOVERSION N/A 04/04/2015   Procedure: TRANSESOPHAGEAL ECHOCARDIOGRAM (TEE);  Surgeon: Lelon Perla, MD;  Location: [facility] ENDOSCOPY;  Service: Cardiovascular;  Laterality: N/A;   TRIGGER FINGER RELEASE  Left 12/28/2019   Procedure: RELEASE TRIGGER FINGER/A-1 PULLEY THUMB, MIDDLE AND RING;  Surgeon: Daryll Brod, MD;  Location: [place];  Service: Orthopedics;  Laterality: Left;  FAB   UPPER GASTROINTESTINAL ENDOSCOPY     V Tach ablation  09/26/2015   Patient Active Problem List   Diagnosis Date Noted   Coronary artery disease involving native coronary artery of native heart without angina pectoris 05/28/2021   History of myocardial infarction 02/05/2021   Parkinson's disease    Malignant neoplasm of prostate ([place]) 11/01/2017   Essential tremor 12/06/2015   PVC (premature ventricular contraction) 03/55/9741   Embolic stroke involving left middle cerebral artery ([place]) 04/07/2015   Acute CVA (cerebrovascular accident) ([place]) 03/06/2015   Stroke (cerebrum) ([place]) 03/05/2015   CVA (cerebral infarction) 03/05/2015   Weakness of right upper extremity    Tremor    Ejection fraction    Vertigo    Sleep apnea     Palpitations    Hyperlipidemia with target LDL less than 70    GERD (gastroesophageal reflux disease)    HTN (hypertension)    Chest pain    RBBB (right bundle branch block)    Ejection fraction    Carotid artery disease ([place])     ONSET DATE:  11/18/2021  REFERRING DIAG: G20.A1 (ICD-10-CM) - Parkinson's disease without dyskinesia or fluctuating manifestations   THERAPY DIAG:  Unsteadiness on feet  Other abnormalities of gait and mobility  Muscle weakness (generalized)  Other symptoms and signs involving the nervous system  Rationale for Evaluation and Treatment: Rehabilitation  SUBJECTIVE:                                                                                                                                                                                             SUBJECTIVE STATEMENT: No changes since he was here. Got his knee shots yesterday and reports knees are feeling a little sore.   Pt accompanied by: self  PERTINENT HISTORY: PMH: PD, Anxiety, HTN, HLD, multiple back surgeries/fusion neck and lumbar spine,  NSTEMI, 01/2020, hx of multiple syncopal episodes  PAIN:   Bilateral knees are hurting at a 6-7/10 pain  Will occasionally have pain in L knee if he steps a certain way.   Has a hx of 7 back surgeries, has sore knees and gets injections in both. Also soreness in R hand and if he moves the R hand wrong it will be sharp. "Whole body will hurt from time to time"   PRECAUTIONS: Fall  WEIGHT BEARING RESTRICTIONS: No  FALLS: Has patient fallen in last 6 months? No  LIVING ENVIRONMENT: Lives  with: lives with their spouse Lives in: House/apartment - [place]: No Has following equipment at home: Single point cane, Environmental consultant - 4 wheeled, Grab bars, and transport chair Has a lift chair and a lift bed.   PLOF: Independent Reports things like using a screwdriver or trying to get a nail in takes him a lot longer now. Wife has to help him a lot with these  tasks.   PATIENT GOALS: Wants to work on his balance.   OBJECTIVE:   TODAY'S TREATMENT:          GAIT: Gait pattern: step through pattern, decreased arm swing- Right, decreased stride length, decreased trunk rotation, and trunk flexed Distance walked: Clinic distances  Assistive device utilized: Single point cane and None Level of assistance: SBA Comments: Pt ambulates into session with SPC, ambulates some distances during session with no AD                      Ambulated 115' with no AD with cues for incr stride length and arm swing esp with RUE and opening up R hand.    Therapeutic Exercise: SciFit with BUE/BLE Multi-Peaks at level 4.0 > 3.0 (needing to drop down due to pt reporting level 4.0 too challenging for larger hills) for 8 minutes for reciprocal movement patterns, activity tolerance, ROM, and decr knee stiffness prior to session.   NMR: With 4 blaze pods on the level ground (pt needing to tap the color different than the rest), working on SLS and alternating toe taps, then progressing to standing on foam x10 reps bilat, then progressing to pt naming foods in alphabetical order when continuing to tap, pt most challenged by dual tasking and needs to stop the balance task in order to think of an object. Pt more challenged by SLS tasks on LLE  On air ex lateral stepping progressing to PWR Step with looking at hand, x15 reps each side  On blue foam beam: side stepping down and back x4 reps, cues for posture/looking ahead and incr foot clearance when stepping vs. Sliding foot on beam, pt needing to use bars intermittently for balance  Staggered stance weight shifting then adding in reciprocal arm swing with focus on large amplitude movements and opening up hands, approx. 20 reps each side, cues for knee extension when shifting weight forwards. Pt more off balance with LLE forwards and needing intermittent UE support for balance.  On level ground, alternating forward SLS taps to 2 cones  x10 reps each side, then cross body taps x10 reps each side, with no UE support, focus on slowed pace and incr foot clearance when tapping.    PATIENT EDUCATION: Education details: Continue HEP, with static standing focus on wider BOS/or wide staggered stance for balance as pt tends to have a more narrow BOS.  Person educated: Patient Education method: Explanation, Demonstration, and Verbal cues Education comprehension: verbalized understanding and returned demonstration  HOME EXERCISE PROGRAM: Standing PWR moves  Access Code: [email or web address] URL: [email or web address] Date: 12/08/2021 Prepared by: [place] with Counter Support  - 1 x daily - 5 x weekly - 1-2 sets - 10 reps - Standing Single Leg Stance with Counter Support  - 1 x daily - 5 x weekly - 3 sets - 10-15 hold - Standing Balance with Eyes Closed on Foam  - 1 x daily - 5 x weekly - 3 sets - 30 hold - Romberg Stance on Foam Pad with Head  Rotation and Head Nods  - 1 x daily - 5 x weekly - 2 sets - 10 reps    GOALS: Goals reviewed with patient? Yes  SHORT TERM GOALS: Target date: 12/26/2021  Pt will be independent with initial HEP for PD specific deficits in order to build upon functional gains made in therapy. Baseline: Goal status: INITIAL  2.   Pt will verbalize understanding of fall prevention in the home environment.  Baseline:  Goal status: INITIAL  3.  Pt will finish assessment of miniBEST with LTG written. Baseline: 20/28 Goal status: MET  4.  Pt will improve gait speed with LRAD to at least 2.7 ft/sec in order to demo improved community mobility.   Baseline: 14.2 seconds with cane =2.31 ft/sec Goal status: INITIAL    LONG TERM GOALS: Target date: 01/23/2022  Pt will be independent with final HEP for PD specific deficits in order to build upon functional gains made in therapy. Baseline:  Goal status: INITIAL  2.  Pt will improve miniBEST to at least a 23/28  in order to demo decr fall risk.  Baseline: 20/28 Goal status: INITIAL  3.  Pt will verbalize understanding of local Parkinson's disease resources, including options for continued community fitness. Baseline:  Goal status: INITIAL  4.  Pt will improve condition 4 of mCTSIB to at least 15 seconds in order to demo improved vestibular input for balance.  Baseline: 5.4 seconds Goal status: INITIAL  5.  Pt will perform at least 10 sit <> stand reps with proper technique and no episodes of retropulsion in order to demo improved transfer efficiency.  Baseline:  Goal status: INITIAL  6.   Pt will improve gait speed with LRAD to at least 3.0 ft/sec in order to demo improved community mobility.  Baseline: 14.2 seconds with cane =2.31 ft/sec Goal status: INITIAL  ASSESSMENT:  CLINICAL IMPRESSION: Today's skilled session focused on balance strategies on unlevel surfaces, SLS, and weight shifting. Pt most challenged with cognitive dual tasking during standing balance, needs cues to think of an object as well as pt stopping the balance task to focus on the cognitive task. Pt has more difficulty with SLS tasks on LLE, and more off balance with staggered stance weight shifting when shifting anteriorly on L. Will continue to progress towards LTGs.    OBJECTIVE IMPAIRMENTS: Abnormal gait, decreased activity tolerance, decreased balance, decreased coordination, decreased mobility, difficulty walking, decreased ROM, decreased strength, impaired flexibility, impaired tone, postural dysfunction, and pain.   ACTIVITY LIMITATIONS: bending, standing, stairs, transfers, bed mobility, and locomotion level  PARTICIPATION LIMITATIONS: community activity and yard work  PERSONAL FACTORS: Age, Behavior pattern, Past/current experiences, Time since onset of injury/illness/exacerbation, and 3+ comorbidities: PD, Anxiety, HTN, HLD, multiple back surgeries/fusion neck and lumbar spine,  NSTEMI, 01/2020, hx of multiple  syncopal episodes  are also affecting patient's functional outcome.   REHAB POTENTIAL: Good  CLINICAL DECISION MAKING: Evolving/moderate complexity  EVALUATION COMPLEXITY: Moderate  PLAN:  PT FREQUENCY: 2x/week  PT DURATION: 12 weeks  PLANNED INTERVENTIONS: Therapeutic exercises, Therapeutic activity, Neuromuscular re-education, Balance training, Gait training, Patient/Family education, Self Care, Stair training, Vestibular training, DME instructions, Aquatic Therapy, Manual therapy, and Re-evaluation  PLAN FOR NEXT SESSION: Any to HEP as needed. Review standing PWR moves. SciFit. Work on balance, turning. SLS, unlevel surfaces, reactive stepping. Any word for aquatic PT?    Arliss Journey, PT, DPT  12/10/2021, 12:11 PM

## 2021-12-11 ENCOUNTER — Other Ambulatory Visit: Payer: Self-pay | Admitting: Neurology

## 2021-12-11 DIAGNOSIS — G20A1 Parkinson's disease without dyskinesia, without mention of fluctuations: Secondary | ICD-10-CM

## 2021-12-15 ENCOUNTER — Ambulatory Visit: Payer: Medicare HMO | Admitting: Physical Therapy

## 2021-12-17 NOTE — Progress Notes (Unsigned)
PCP:  Ginger Organ., MD Primary Cardiologist: Candee Furbish, MD Electrophysiologist: Will Meredith Leeds, MD   Wolf Boulay is a 76 y.o. male seen today for Will Meredith Leeds, MD for {VISITTYPE:28148}  Past Medical History:  Diagnosis Date   Allergic rhinitis    Allergy    Anal fissure    Anxiety    from chronic pain from surgery- on Cymbalta   Arthritis    Asthma    Barrett's esophagus 03/29/2014   Carotid artery disease ([place])    Carotid Doppler normal August, 2007   Cataract    Chest pain    Coronaries normal 1996 /  nuclear..06/2002..normal...EF  56% /  stress echo.. May, 2011.... no  scar or ischemia... rate related RBBB   Diverticulosis    Dyslipidemia    Ejection fraction    EF 60%, stress echo, May, 2011   GERD (gastroesophageal reflux disease)    Headache    History of loop recorder    has since 04/04/15   HTN (hypertension)    takes Metoprolol for PVC control   Hx of colonic polyps    adenomatous   Hx of colonoscopy    Hyperlipidemia    IFG (impaired fasting glucose)    Palpitations    Benign PVCs   Parkinson disease    Prostate cancer (HCC)    RBBB (right bundle branch block)    rate related   Shingles    Stroke [facility])    TIA   TIA (transient ischemic attack) 02/2015   Per pt, had 2 strokes   Tremor    Hand tremor   Vertigo    Past Surgical History:  Procedure Laterality Date   BACK SURGERY  2002,2009   x 6   CHOLECYSTECTOMY  11/30/2012   with IOC   COLONOSCOPY     ELECTROPHYSIOLOGIC STUDY N/A 09/26/2015   Procedure: V Tach Ablation (PVC);  Surgeon: Will Meredith Leeds, MD;  Location: [place] [facility];  Service: Cardiovascular;  Laterality: N/A;   EP IMPLANTABLE DEVICE N/A 04/04/2015   Procedure: Loop Recorder Insertion;  Surgeon: Will Meredith Leeds, MD;  Location: [place] [facility];  Service: Cardiovascular;  Laterality: N/A;   HERNIA REPAIR     laprascopic   KNEE SURGERY     DECEMBER 2017,LEFT KNEE SCOPED   LEFT HEART  CATH AND CORONARY ANGIOGRAPHY N/A 02/05/2021   Procedure: LEFT HEART CATH AND CORONARY ANGIOGRAPHY;  Surgeon: Wellington Hampshire, MD;  Location: [place] [facility];  Service: Cardiovascular;  Laterality: N/A;   NECK SURGERY  2002   POLYPECTOMY     ROTATOR CUFF REPAIR Left    TEE WITHOUT CARDIOVERSION N/A 04/04/2015   Procedure: TRANSESOPHAGEAL ECHOCARDIOGRAM (TEE);  Surgeon: Lelon Perla, MD;  Location: [facility] ENDOSCOPY;  Service: Cardiovascular;  Laterality: N/A;   TRIGGER FINGER RELEASE Left 12/28/2019   Procedure: RELEASE TRIGGER FINGER/A-1 PULLEY THUMB, MIDDLE AND RING;  Surgeon: Daryll Brod, MD;  Location: [place];  Service: Orthopedics;  Laterality: Left;  FAB   UPPER GASTROINTESTINAL ENDOSCOPY     V Tach ablation  09/26/2015    Current Outpatient Medications  Medication Sig Dispense Refill   acetaminophen (TYLENOL) 500 MG tablet Take 1,000 mg by mouth in the morning and at bedtime.     aspirin 81 MG EC tablet Take 1 tablet (81 mg total) by mouth daily. Swallow whole. 30 tablet 11   Biotin 10000 MCG TABS Take 1,000 mcg by mouth in the  morning.     carbidopa-levodopa (SINEMET CR) 50-200 MG tablet TAKE 1 TABLET BY MOUTH EVERYDAY AT BEDTIME 90 tablet 1   carbidopa-levodopa (SINEMET IR) 25-100 MG tablet 2 tablets at 7 AM/2 tablets at 10 AM/2 at 1pm/2 at 4pm/2 at 7pm 900 tablet 2   cholecalciferol (VITAMIN D3) 25 MCG (1000 UNIT) tablet Take 1,000 Units by mouth daily.     clopidogrel (PLAVIX) 75 MG tablet Take 1 tablet (75 mg total) by mouth daily. 30 tablet 2   Cyanocobalamin (VITAMIN B 12 PO) Take 1,000 mcg by mouth daily.     DYMISTA 137-50 MCG/ACT SUSP Place 1 puff into both nostrils at bedtime.      escitalopram (LEXAPRO) 10 MG tablet Take 10 mg by mouth daily.     finasteride (PROSCAR) 5 MG tablet Take 5 mg by mouth every evening.     lansoprazole (PREVACID) 30 MG capsule Take 30 mg by mouth 2 (two) times daily. Morning & mid-afternoon     levocetirizine (XYZAL) 5 MG  tablet Take 5 mg by mouth every evening.       metoprolol succinate (TOPROL-XL) 25 MG 24 hr tablet TAKE 1/2 TABLET BY MOUTH DAILY 45 tablet 2   midodrine (PROAMATINE) 5 MG tablet TAKE 1 TABLET BY MOUTH 2 TIMES DAILY AS NEEDED. TAKE IF SYSTOLIC BLOOD PRESSURE GETS BELOW 100 180 tablet 3   montelukast (SINGULAIR) 10 MG tablet Take 10 mg by mouth at bedtime.       nitroGLYCERIN (NITROSTAT) 0.4 MG SL tablet Place 1 tablet (0.4 mg total) under the tongue every 5 (five) minutes as needed for chest pain (Do not give more than 3 SL tablets in 15 minutes.). 25 tablet 2   Omega-3 Fatty Acids (FISH OIL) 1000 MG CAPS Take 1,000 mg by mouth in the morning.     PROCTOSOL HC 2.5 % rectal cream APPLY INTO AND AROUND RECTUM 2 TIMES A DAY 28.35 g 0   rosuvastatin (CRESTOR) 20 MG tablet Take 1 tablet (20 mg total) by mouth at bedtime. 90 tablet 0   tamsulosin (FLOMAX) 0.4 MG CAPS capsule Take 0.4 mg by mouth daily after supper.      traMADol (ULTRAM) 50 MG tablet Take 50 mg by mouth every 6 (six) hours as needed. for pain  0   zolpidem (AMBIEN) 10 MG tablet Take 10 mg by mouth at bedtime.     No current facility-administered medications for this visit.    Allergies  Allergen Reactions   Floxin I.V. In Dextrose 5% [Ofloxacin] Other (See Comments)    Lowers BP Lowers BP   Terazosin Other (See Comments)    Lower bp    Social History   Socioeconomic History   Marital status: Married    Spouse name: [name]    Number of children: 2   Years of education: 15   Highest education level: Some college, no degree  Occupational History   Occupation: Retired    Comment: Dance movement psychotherapist  Tobacco Use   Smoking status: Never   Smokeless tobacco: Never  Scientific laboratory technician Use: Never used  Substance and Sexual Activity   Alcohol use: No    Alcohol/week: 0.0 standard drinks of alcohol   Drug use: No   Sexual activity: Not Currently  Other Topics Concern   Not on file  Social History Narrative   Lives with  wife.    Caffeine use: Coffee/tea/soda- ocassionally   Right-handed.   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  Physical Exam: There were no vitals filed for this visit.  GEN- The patient is well appearing, alert and oriented x 3 today.   HEENT: normocephalic, atraumatic; sclera clear, conjunctiva pink; hearing intact; oropharynx clear; neck supple, no JVP Lymph- no cervical lymphadenopathy Lungs- Clear to ausculation bilaterally, normal work of breathing.  No wheezes, rales, rhonchi Heart- {Blank single:19197::"Regular","Irregularly irregular"} rate and rhythm, no murmurs, rubs or gallops, PMI not laterally displaced GI- soft, non-tender, non-distended, bowel sounds present, no hepatosplenomegaly Extremities- {EDEMA IDUPB:35789} peripheral edema. no clubbing or cyanosis; DP/PT/radial pulses 2+ bilaterally MS- no significant deformity or atrophy Skin- warm and dry, no rash or lesion Psych- euthymic mood, full affect Neuro- strength and sensation are intact  EKG is ordered today. Personal review of EKG from today shows NSR at ***  Additional studies reviewed include: Previous EP notes.   Assessment and Plan:  1.  PVCs:  Status post ablation with intermittent suppression.   His flecainide was stopped.   He started mexiletine but he had GI issues.   Currently not on any antiarrhythmics, but having intermittent symptoms since mexitil stopped.    2.  Hypertension: Currently well  controlled   3.  Cryptogenic stroke: Linq monitor post explant.  Management per neurology.   4.  Coronary artery disease: Status post non-STEMI.  Has moderate level disease.  Continue aspirin and statin per primary cardiology.  Follow up with {EPMDS:28135} in {EPFOLLOW UP:28173}  Shirley Friar, PA-C  12/17/21 9:44 AM

## 2021-12-18 ENCOUNTER — Ambulatory Visit: Payer: Medicare HMO | Attending: Neurology | Admitting: Physical Therapy

## 2021-12-18 ENCOUNTER — Ambulatory Visit: Payer: Medicare HMO | Attending: Student | Admitting: Student

## 2021-12-18 ENCOUNTER — Encounter: Payer: Self-pay | Admitting: Student

## 2021-12-18 VITALS — BP 114/72 | HR 74 | Ht 73.0 in | Wt 221.4 lb

## 2021-12-18 DIAGNOSIS — R2689 Other abnormalities of gait and mobility: Secondary | ICD-10-CM | POA: Diagnosis present

## 2021-12-18 DIAGNOSIS — R2681 Unsteadiness on feet: Secondary | ICD-10-CM | POA: Insufficient documentation

## 2021-12-18 DIAGNOSIS — M6281 Muscle weakness (generalized): Secondary | ICD-10-CM | POA: Diagnosis present

## 2021-12-18 DIAGNOSIS — I493 Ventricular premature depolarization: Secondary | ICD-10-CM

## 2021-12-18 DIAGNOSIS — R293 Abnormal posture: Secondary | ICD-10-CM | POA: Diagnosis present

## 2021-12-18 DIAGNOSIS — I2510 Atherosclerotic heart disease of native coronary artery without angina pectoris: Secondary | ICD-10-CM | POA: Diagnosis not present

## 2021-12-18 DIAGNOSIS — R29818 Other symptoms and signs involving the nervous system: Secondary | ICD-10-CM | POA: Insufficient documentation

## 2021-12-18 DIAGNOSIS — I4510 Unspecified right bundle-branch block: Secondary | ICD-10-CM | POA: Diagnosis not present

## 2021-12-18 DIAGNOSIS — I10 Essential (primary) hypertension: Secondary | ICD-10-CM

## 2021-12-18 MED ORDER — MEXILETINE HCL 150 MG PO CAPS: 150.0000 mg | ORAL_CAPSULE | Freq: Two times a day (BID) | ORAL | 5 refills | Status: DC

## 2021-12-18 NOTE — Patient Instructions (Addendum)
Medication Instructions:  Your physician has recommended you make the following change in your medication: START TAKING: Mexiletine 150 MG   You will: Take 1 capsule (150 mg total) by mouth 2 (two) times daily.   Lab Work: None ordered.  If you have labs (blood work) drawn today and your tests are completely normal, you will receive your results only by: [place] (if you have MyChart) OR A paper copy in the mail If you have any lab test that is abnormal or we need to change your treatment, we will call you to review the results.  Testing/Procedures: None ordered.  Follow-Up: At [facility], you and your health needs are our priority.  As part of our continuing mission to provide you with exceptional heart care, we have created designated Provider Care Teams.  These Care Teams include your primary Cardiologist (physician) and Advanced Practice Providers (APPs -  Physician Assistants and Nurse Practitioners) who all work together to provide you with the care you need, when you need it.  We recommend signing up for the patient portal called "MyChart".  Sign up information is provided on this After Visit Summary.  MyChart is used to connect with patients for Virtual Visits (Telemedicine).  Patients are able to view lab/test results, encounter notes, upcoming appointments, etc.  Non-urgent messages can be sent to your provider as well.   To learn more about what you can do with MyChart, go to [email or web address].    Your next appointment:   Please schedule a 6 month follow up appointment with Dr. Curt Bears.    The format for your next appointment:   In Person  Provider:   Legrand Como "Jonni Sanger" Chalmers Cater, [place]   Important Information About Sugar

## 2021-12-18 NOTE — Therapy (Signed)
OUTPATIENT PHYSICAL THERAPY NEURO TREATMENT   Patient Name: Story Vanvranken MRN: [id number] DOB:[birth year], 76 y.o., male Today's Date: 12/18/2021   PCP: Ginger Organ., MD   REFERRING PROVIDER:  Carles Collet Eustace Quail, DO  END OF SESSION:  PT End of Session - 12/18/21 1424     Visit Number 4    Number of Visits 17    Date for PT Re-Evaluation 02/26/22   due to delay in scheduling   Authorization Type [employer]    PT Start Time 1422   Pt not checked in by front desk staff   PT Stop Time 1508    PT Time Calculation (min) 46 min    Equipment Utilized During Treatment Gait belt    Activity Tolerance Patient tolerated treatment well    Behavior During Therapy [facility] for tasks assessed/performed              Past Medical History:  Diagnosis Date   Allergic rhinitis    Allergy    Anal fissure    Anxiety    from chronic pain from surgery- on Cymbalta   Arthritis    Asthma    Barrett's esophagus 03/29/2014   Carotid artery disease ([place])    Carotid Doppler normal August, 2007   Cataract    Chest pain    Coronaries normal 1996 /  nuclear..06/2002..normal...EF  56% /  stress echo.. May, 2011.... no  scar or ischemia... rate related RBBB   Diverticulosis    Dyslipidemia    Ejection fraction    EF 60%, stress echo, May, 2011   GERD (gastroesophageal reflux disease)    Headache    History of loop recorder    has since 04/04/15   HTN (hypertension)    takes Metoprolol for PVC control   Hx of colonic polyps    adenomatous   Hx of colonoscopy    Hyperlipidemia    IFG (impaired fasting glucose)    Palpitations    Benign PVCs   Parkinson disease    Prostate cancer (HCC)    RBBB (right bundle branch block)    rate related   Shingles    Stroke [facility])    TIA   TIA (transient ischemic attack) 02/2015   Per pt, had 2 strokes   Tremor    Hand tremor   Vertigo    Past Surgical History:  Procedure Laterality Date   BACK SURGERY  2002,2009   x 6    CHOLECYSTECTOMY  11/30/2012   with IOC   COLONOSCOPY     ELECTROPHYSIOLOGIC STUDY N/A 09/26/2015   Procedure: V Tach Ablation (PVC);  Surgeon: Will Meredith Leeds, MD;  Location: [place] [facility];  Service: Cardiovascular;  Laterality: N/A;   EP IMPLANTABLE DEVICE N/A 04/04/2015   Procedure: Loop Recorder Insertion;  Surgeon: Will Meredith Leeds, MD;  Location: [place] [facility];  Service: Cardiovascular;  Laterality: N/A;   HERNIA REPAIR     laprascopic   KNEE SURGERY     DECEMBER 2017,LEFT KNEE SCOPED   LEFT HEART CATH AND CORONARY ANGIOGRAPHY N/A 02/05/2021   Procedure: LEFT HEART CATH AND CORONARY ANGIOGRAPHY;  Surgeon: Wellington Hampshire, MD;  Location: [place] [facility];  Service: Cardiovascular;  Laterality: N/A;   NECK SURGERY  2002   POLYPECTOMY     ROTATOR CUFF REPAIR Left    TEE WITHOUT CARDIOVERSION N/A 04/04/2015   Procedure: TRANSESOPHAGEAL ECHOCARDIOGRAM (TEE);  Surgeon: Lelon Perla, MD;  Location: [facility] ENDOSCOPY;  Service:  Cardiovascular;  Laterality: N/A;   TRIGGER FINGER RELEASE Left 12/28/2019   Procedure: RELEASE TRIGGER FINGER/A-1 PULLEY THUMB, MIDDLE AND RING;  Surgeon: Daryll Brod, MD;  Location: [place];  Service: Orthopedics;  Laterality: Left;  FAB   UPPER GASTROINTESTINAL ENDOSCOPY     V Tach ablation  09/26/2015   Patient Active Problem List   Diagnosis Date Noted   Coronary artery disease involving native coronary artery of native heart without angina pectoris 05/28/2021   History of myocardial infarction 02/05/2021   Parkinson's disease    Malignant neoplasm of prostate ([place]) 11/01/2017   Essential tremor 12/06/2015   PVC (premature ventricular contraction) 76/72/0947   Embolic stroke involving left middle cerebral artery ([place]) 04/07/2015   Acute CVA (cerebrovascular accident) ([place]) 03/06/2015   Stroke (cerebrum) ([place]) 03/05/2015   CVA (cerebral infarction) 03/05/2015   Weakness of right upper extremity    Tremor    Ejection  fraction    Vertigo    Sleep apnea    Palpitations    Hyperlipidemia with target LDL less than 70    GERD (gastroesophageal reflux disease)    HTN (hypertension)    Chest pain    RBBB (right bundle branch block)    Ejection fraction    Carotid artery disease ([employer])     ONSET DATE:  11/18/2021  REFERRING DIAG: G20.A1 (ICD-10-CM) - Parkinson's disease without dyskinesia or fluctuating manifestations   THERAPY DIAG:  Unsteadiness on feet  Other abnormalities of gait and mobility  Muscle weakness (generalized)  Rationale for Evaluation and Treatment: Rehabilitation  SUBJECTIVE:                                                                                                                                                                                             SUBJECTIVE STATEMENT: Pt reports one fall since last visit. Pt was sitting in a chair in [place] and the legs gave out on him and he fell into floor. Denies injuries and states his children helped him up. Knees are really bothering him today.   Pt accompanied by: self  PERTINENT HISTORY: PMH: PD, Anxiety, HTN, HLD, multiple back surgeries/fusion neck and lumbar spine,  NSTEMI, 01/2020, hx of multiple syncopal episodes  PAIN:  Bilateral knees are hurting at a 6-7/10 pain and low back pain at 6/10  Will occasionally have pain in L knee if he steps a certain way.   Has a hx of 7 back surgeries, has sore knees and gets injections in both. Also soreness in R hand and if he moves the R hand wrong it will be sharp. "Whole body will  hurt from time to time"   PRECAUTIONS: Fall  WEIGHT BEARING RESTRICTIONS: No  FALLS: Has patient fallen in last 6 months? No  LIVING ENVIRONMENT: Lives with: lives with their spouse Lives in: House/apartment - [place]: No Has following equipment at home: Single point cane, Environmental consultant - 4 wheeled, Grab bars, and transport chair Has a lift chair and a lift bed.   PLOF: Independent Reports  things like using a screwdriver or trying to get a nail in takes him a lot longer now. Wife has to help him a lot with these tasks.   PATIENT GOALS: Wants to work on his balance.   OBJECTIVE:   TODAY'S TREATMENT:         Ther Ex  SciFit multi-peaks level 3 for 8 minutes using BUE/BLEs for neural priming for reciprocal movement, dynamic cardiovascular warmup and increased amplitude of stepping. RPE of 7/10 following activity   NMR  In // bars for improved single leg stability, vestibular input and ankle strategy:  Alt step ups on blue airex w/contralateral march and 3s isometric hold, x10 per side without UE support. Noted increased difficulty performing on LLE (pt wearing AFO). Min A throughout due to posterolateral LOB to L side.  On rockerboard in A/P direction, standing without UE support x2 minutes for midline orientation and ankle strategy. Pt had significant difficulty maintaining balance, frequently relying on UE support to correct posterior LOB. Min cues to facilitate ankle strategy as pt very reliant on hip strategy.  Progressed to standing on board w/EC and no UE support, which was extremely difficult for pt. Noted delayed righting reactions and significant recurrent LOB in posterior direction, requiring mod A to stabilize (one incidence of pt stepping off of board to try to recover balance requiring max A). Min cues to facilitate ankle strategy rather than hip strategy, as pt overcorrecting and making task more challenging. After a seated rest break, pt attempted task again and improved performance but continued to require min A to stabilize.  On rockerboard, alt cone taps x10 per side without UE support. Noted pt maintaining very narrow BOS, so min cues for wider BOS throughout. No instability noted, but pt w/increased difficulty standing on LLE > RLE   Forward step w/reciprocal [place] scarf throw, x10 per side part-practice and x20 reps whole practice for improved reciprocal coordination,  step length and open hands. Min A throughout for safety. Pt performed well when stepping w/RLE, but had significant difficulty coordinating RUE w/LLE step. Pt initially unable to identify when he threw w/incorrect arm, but w/whole practice, started to identify his errors.   GAIT: Gait pattern: step through pattern, decreased arm swing- Right, decreased stride length, decreased trunk rotation, and trunk flexed Distance walked: Clinic distances  Assistive device utilized: Single point cane and None Level of assistance: SBA Comments: Pt ambulates into session holding SPC, ambulated during session with no AD. Min cues for open hands throughout                       PATIENT EDUCATION: Education details: Continue HEP Person educated: Patient Education method: Consulting civil engineer, Demonstration, and Verbal cues Education comprehension: verbalized understanding and returned demonstration  HOME EXERCISE PROGRAM: Standing PWR moves  Access Code: [email or web address] URL: [email or web address] Date: 12/08/2021 Prepared by: Janann August  Exercises - Heel Toe Raises with Counter Support  - 1 x daily - 5 x weekly - 1-2 sets - 10 reps - Standing Single Leg Stance with Counter Support  -  1 x daily - 5 x weekly - 3 sets - 10-15 hold - Standing Balance with Eyes Closed on Foam  - 1 x daily - 5 x weekly - 3 sets - 30 hold - Romberg Stance on Foam Pad with Head Rotation and Head Nods  - 1 x daily - 5 x weekly - 2 sets - 10 reps    GOALS: Goals reviewed with patient? Yes  SHORT TERM GOALS: Target date: 12/26/2021  Pt will be independent with initial HEP for PD specific deficits in order to build upon functional gains made in therapy. Baseline: Goal status: INITIAL  2.   Pt will verbalize understanding of fall prevention in the home environment.  Baseline:  Goal status: INITIAL  3.  Pt will finish assessment of miniBEST with LTG written. Baseline: 20/28 Goal status: MET  4.  Pt will improve  gait speed with LRAD to at least 2.7 ft/sec in order to demo improved community mobility.   Baseline: 14.2 seconds with cane =2.31 ft/sec Goal status: INITIAL    LONG TERM GOALS: Target date: 01/23/2022  Pt will be independent with final HEP for PD specific deficits in order to build upon functional gains made in therapy. Baseline:  Goal status: INITIAL  2.  Pt will improve miniBEST to at least a 23/28 in order to demo decr fall risk.  Baseline: 20/28 Goal status: INITIAL  3.  Pt will verbalize understanding of local Parkinson's disease resources, including options for continued community fitness. Baseline:  Goal status: INITIAL  4.  Pt will improve condition 4 of mCTSIB to at least 15 seconds in order to demo improved vestibular input for balance.  Baseline: 5.4 seconds Goal status: INITIAL  5.  Pt will perform at least 10 sit <> stand reps with proper technique and no episodes of retropulsion in order to demo improved transfer efficiency.  Baseline:  Goal status: INITIAL  6.   Pt will improve gait speed with LRAD to at least 3.0 ft/sec in order to demo improved community mobility.  Baseline: 14.2 seconds with cane =2.31 ft/sec Goal status: INITIAL  ASSESSMENT:  CLINICAL IMPRESSION: Session started late as front desk staff did not check pt in. Emphasis of skilled PT session on reciprocal coordination, single leg stability and facilitation of ankle strategy. Pt most challenged by standing w/EC and demonstrated significant LOB in posterior direction w/delayed righting reactions. Pt able to stabilize well on LLE when slowing down, but required intermittent cues to do so. Continue POC.    OBJECTIVE IMPAIRMENTS: Abnormal gait, decreased activity tolerance, decreased balance, decreased coordination, decreased mobility, difficulty walking, decreased ROM, decreased strength, impaired flexibility, impaired tone, postural dysfunction, and pain.   ACTIVITY LIMITATIONS: bending,  standing, stairs, transfers, bed mobility, and locomotion level  PARTICIPATION LIMITATIONS: community activity and yard work  PERSONAL FACTORS: Age, Behavior pattern, Past/current experiences, Time since onset of injury/illness/exacerbation, and 3+ comorbidities: PD, Anxiety, HTN, HLD, multiple back surgeries/fusion neck and lumbar spine,  NSTEMI, 01/2020, hx of multiple syncopal episodes  are also affecting patient's functional outcome.   REHAB POTENTIAL: Good  CLINICAL DECISION MAKING: Evolving/moderate complexity  EVALUATION COMPLEXITY: Moderate  PLAN:  PT FREQUENCY: 2x/week  PT DURATION: 12 weeks  PLANNED INTERVENTIONS: Therapeutic exercises, Therapeutic activity, Neuromuscular re-education, Balance training, Gait training, Patient/Family education, Self Care, Stair training, Vestibular training, DME instructions, Aquatic Therapy, Manual therapy, and Re-evaluation  PLAN FOR NEXT SESSION: Any to HEP as needed. Review standing PWR moves. SciFit. Work on balance, turning. SLS, unlevel surfaces,  reactive stepping. Will start aquatics after Christmas. Gait and turns w/surge    Cruzita Lederer Shaketta Rill, PT, DPT  12/18/2021, 3:14 PM

## 2021-12-19 ENCOUNTER — Ambulatory Visit: Payer: Medicare HMO | Admitting: Physical Therapy

## 2021-12-22 ENCOUNTER — Ambulatory Visit: Payer: Medicare HMO | Admitting: Physical Therapy

## 2021-12-23 ENCOUNTER — Ambulatory Visit: Payer: Medicare HMO | Admitting: Physical Therapy

## 2021-12-23 ENCOUNTER — Encounter: Payer: Self-pay | Admitting: Physical Therapy

## 2021-12-23 DIAGNOSIS — R2689 Other abnormalities of gait and mobility: Secondary | ICD-10-CM

## 2021-12-23 DIAGNOSIS — M6281 Muscle weakness (generalized): Secondary | ICD-10-CM

## 2021-12-23 DIAGNOSIS — R29818 Other symptoms and signs involving the nervous system: Secondary | ICD-10-CM

## 2021-12-23 DIAGNOSIS — R2681 Unsteadiness on feet: Secondary | ICD-10-CM

## 2021-12-23 NOTE — Therapy (Signed)
OUTPATIENT PHYSICAL THERAPY NEURO TREATMENT   Patient Name: Joshua [name] MRN: [id number] DOB:11/28/1945, 76 y.o., male Today's Date: 12/23/2021   PCP: Ginger Organ., MD   REFERRING PROVIDER:  Carles Collet Eustace Quail, DO  END OF SESSION:  PT End of Session - 12/23/21 1405     Visit Number 5    Number of Visits 17    Date for PT Re-Evaluation 02/26/22   due to delay in scheduling   Authorization Type [employer]    PT Start Time 1403    PT Stop Time 1443    PT Time Calculation (min) 40 min    Equipment Utilized During Treatment Gait belt    Activity Tolerance Patient tolerated treatment well    Behavior During Therapy [facility] for tasks assessed/performed              Past Medical History:  Diagnosis Date   Allergic rhinitis    Allergy    Anal fissure    Anxiety    from chronic pain from surgery- on Cymbalta   Arthritis    Asthma    Barrett's esophagus 03/29/2014   Carotid artery disease ([place])    Carotid Doppler normal August, 2007   Cataract    Chest pain    Coronaries normal 1996 /  nuclear..06/2002..normal...EF  56% /  stress echo.. May, 2011.... no  scar or ischemia... rate related RBBB   Diverticulosis    Dyslipidemia    Ejection fraction    EF 60%, stress echo, May, 2011   GERD (gastroesophageal reflux disease)    Headache    History of loop recorder    has since 04/04/15   HTN (hypertension)    takes Metoprolol for PVC control   Hx of colonic polyps    adenomatous   Hx of colonoscopy    Hyperlipidemia    IFG (impaired fasting glucose)    Palpitations    Benign PVCs   Parkinson disease    Prostate cancer (HCC)    RBBB (right bundle branch block)    rate related   Shingles    Stroke [facility])    TIA   TIA (transient ischemic attack) 02/2015   Per pt, had 2 strokes   Tremor    Hand tremor   Vertigo    Past Surgical History:  Procedure Laterality Date   BACK SURGERY  2002,2009   x 6   CHOLECYSTECTOMY  11/30/2012   with IOC    COLONOSCOPY     ELECTROPHYSIOLOGIC STUDY N/A 09/26/2015   Procedure: V Tach Ablation (PVC);  Surgeon: Will Meredith Leeds, MD;  Location: [place] [facility];  Service: Cardiovascular;  Laterality: N/A;   EP IMPLANTABLE DEVICE N/A 04/04/2015   Procedure: Loop Recorder Insertion;  Surgeon: Will Meredith Leeds, MD;  Location: [place] [facility];  Service: Cardiovascular;  Laterality: N/A;   HERNIA REPAIR     laprascopic   KNEE SURGERY     DECEMBER 2017,LEFT KNEE SCOPED   LEFT HEART CATH AND CORONARY ANGIOGRAPHY N/A 02/05/2021   Procedure: LEFT HEART CATH AND CORONARY ANGIOGRAPHY;  Surgeon: Wellington Hampshire, MD;  Location: [place] [facility];  Service: Cardiovascular;  Laterality: N/A;   NECK SURGERY  2002   POLYPECTOMY     ROTATOR CUFF REPAIR Left    TEE WITHOUT CARDIOVERSION N/A 04/04/2015   Procedure: TRANSESOPHAGEAL ECHOCARDIOGRAM (TEE);  Surgeon: Lelon Perla, MD;  Location: [facility] ENDOSCOPY;  Service: Cardiovascular;  Laterality: N/A;   TRIGGER FINGER RELEASE  Left 12/28/2019   Procedure: RELEASE TRIGGER FINGER/A-1 PULLEY THUMB, MIDDLE AND RING;  Surgeon: Daryll Brod, MD;  Location: [place];  Service: Orthopedics;  Laterality: Left;  FAB   UPPER GASTROINTESTINAL ENDOSCOPY     V Tach ablation  09/26/2015   Patient Active Problem List   Diagnosis Date Noted   Coronary artery disease involving native coronary artery of native heart without angina pectoris 05/28/2021   History of myocardial infarction 02/05/2021   Parkinson's disease    Malignant neoplasm of prostate ([place]) 11/01/2017   Essential tremor 12/06/2015   PVC (premature ventricular contraction) 30/86/5784   Embolic stroke involving left middle cerebral artery ([place]) 04/07/2015   Acute CVA (cerebrovascular accident) ([place]) 03/06/2015   Stroke (cerebrum) ([place]) 03/05/2015   CVA (cerebral infarction) 03/05/2015   Weakness of right upper extremity    Tremor    Ejection fraction    Vertigo    Sleep apnea     Palpitations    Hyperlipidemia with target LDL less than 70    GERD (gastroesophageal reflux disease)    HTN (hypertension)    Chest pain    RBBB (right bundle branch block)    Ejection fraction    Carotid artery disease ([place])     ONSET DATE:  11/18/2021  REFERRING DIAG: G20.A1 (ICD-10-CM) - Parkinson's disease without dyskinesia or fluctuating manifestations   THERAPY DIAG:  Unsteadiness on feet  Other abnormalities of gait and mobility  Muscle weakness (generalized)  Other symptoms and signs involving the nervous system  Rationale for Evaluation and Treatment: Rehabilitation  SUBJECTIVE:                                                                                                                                                                                             SUBJECTIVE STATEMENT: No new falls. Reports knees were throbbing last Friday after the session.   Pt accompanied by: self  PERTINENT HISTORY: PMH: PD, Anxiety, HTN, HLD, multiple back surgeries/fusion neck and lumbar spine,  NSTEMI, 01/2020, hx of multiple syncopal episodes  PAIN:  Bilateral knees are hurting at a 6-7/10 pain and low back pain at 6/10  Will occasionally have pain in L knee if he steps a certain way.   Has a hx of 7 back surgeries, has sore knees and gets injections in both. Also soreness in R hand and if he moves the R hand wrong it will be sharp. "Whole body will hurt from time to time"   PRECAUTIONS: Fall  WEIGHT BEARING RESTRICTIONS: No  FALLS: Has patient fallen in last 6 months? No  LIVING ENVIRONMENT: Lives with: lives  with their spouse Lives in: House/apartment - [place]: No Has following equipment at home: Single point cane, Walker - 4 wheeled, Grab bars, and transport chair Has a lift chair and a lift bed.   PLOF: Independent Reports things like using a screwdriver or trying to get a nail in takes him a lot longer now. Wife has to help him a lot with these  tasks.   PATIENT GOALS: Wants to work on his balance.   OBJECTIVE:   TODAY'S TREATMENT:          NMR  In // bars: On rockerboard in M/L direction -Weight shifting with focus on slowed pace and holding at end range for approx. 3-5 seconds, more difficulty with shifting weight towards L side  -Keeping board still 2 sets of 10 reps head turns, 2 sets of 10 reps head nods, pt needing intermittent taps to bars for balance esp with head turns, but pt did improve with incr reps.   On rockerboard in A/P direction: -Trying to keep board still, performing ball toss with 2nd PT and primary PT providing min guard and adding cognitive challenge with pt needing to name animals in alphabetical order from A-Z. Cues given to pt at times and pt with difficulty performing cog dual task simultaneously with manual dual task, x25 tosses total approx  On blue mat -Forward/retro stepping (going past midline) x15 reps each side, use of floor dots as visual cue for incr amplitude of stepping -Forward/retro marching down and back x2 reps, then adding in reciprocal arm movement down and back x2 reps, focus on slowed pace, pt challenged with reciprocal arm movement, but pt able to tell when he wasn't performing correctly.   Following multi-directional stepping sheet: doing the opposite of R/L and forward/backwards, with cues for larger amplitude of stepping and when stepping back to midline having space between feet. Added difficulty by use of 2" obstacles for incr foot clearance. Pt reporting he has this sheet at home from previous bout of therapy, discussed can add difficulty to home by doing the opposite of directions as well for an incr challenge.     GAIT: Gait pattern: step through pattern, decreased arm swing- Right, decreased stride length, decreased trunk rotation, and trunk flexed Distance walked: Clinic distances  Assistive device utilized: Single point cane and None Level of assistance: SBA Comments: Pt  ambulates into session holding SPC, ambulated during session with no AD. Min cues for open hands throughout                     Gait speed with cane: 12.94 seconds = 2.53 ft/sec   PATIENT EDUCATION: Education details: Continue HEP Person educated: Patient Education method: Explanation, Demonstration, and Verbal cues Education comprehension: verbalized understanding and returned demonstration  HOME EXERCISE PROGRAM: Standing PWR moves  Access Code: [email or web address] URL: [email or web address] Date: 12/08/2021 Prepared by: [place] with Counter Support  - 1 x daily - 5 x weekly - 1-2 sets - 10 reps - Standing Single Leg Stance with Counter Support  - 1 x daily - 5 x weekly - 3 sets - 10-15 hold - Standing Balance with Eyes Closed on Foam  - 1 x daily - 5 x weekly - 3 sets - 30 hold - Romberg Stance on Foam Pad with Head Rotation and Head Nods  - 1 x daily - 5 x weekly - 2 sets - 10 reps    GOALS: Goals reviewed  with patient? Yes  SHORT TERM GOALS: Target date: 12/26/2021  Pt will be independent with initial HEP for PD specific deficits in order to build upon functional gains made in therapy. Baseline: pt reports performing his HEP  Goal status: MET  2.   Pt will verbalize understanding of fall prevention in the home environment.  Baseline:  Goal status: INITIAL  3.  Pt will finish assessment of miniBEST with LTG written. Baseline: 20/28 Goal status: MET  4.  Pt will improve gait speed with LRAD to at least 2.7 ft/sec in order to demo improved community mobility.   Baseline: 14.2 seconds with cane =2.31 ft/sec; 12.94 seconds = 2.53 ft/sec on 12/23/21 Goal status: NOT MET    LONG TERM GOALS: Target date: 01/23/2022  Pt will be independent with final HEP for PD specific deficits in order to build upon functional gains made in therapy. Baseline:  Goal status: INITIAL  2.  Pt will improve miniBEST to at least a 23/28 in order  to demo decr fall risk.  Baseline: 20/28 Goal status: INITIAL  3.  Pt will verbalize understanding of local Parkinson's disease resources, including options for continued community fitness. Baseline:  Goal status: INITIAL  4.  Pt will improve condition 4 of mCTSIB to at least 15 seconds in order to demo improved vestibular input for balance.  Baseline: 5.4 seconds Goal status: INITIAL  5.  Pt will perform at least 10 sit <> stand reps with proper technique and no episodes of retropulsion in order to demo improved transfer efficiency.  Baseline:  Goal status: INITIAL  6.   Pt will improve gait speed with LRAD to at least 3.0 ft/sec in order to demo improved community mobility.  Baseline: 14.2 seconds with cane =2.31 ft/sec Goal status: INITIAL  ASSESSMENT:  CLINICAL IMPRESSION: Began to check pt's STGs today with pt meeting STG #1 in regards to performing HEP at home. Pt improved gait speed with walking speed to 2.53 ft/sec (previously was 2.31 ft/sec with cane), indicating improved community mobility, but not quite to goal level. Remainder of session focused on balance strategies on compliant surfaces for weight shifting, stepping strategies, and SLS. Pt challenged by adding in a cognitive dual task during balance, esp with a manual task with a ball toss. Pt unable to simultaneously think and perform ball toss at the same time. Will continue to progress towards LTGs.     OBJECTIVE IMPAIRMENTS: Abnormal gait, decreased activity tolerance, decreased balance, decreased coordination, decreased mobility, difficulty walking, decreased ROM, decreased strength, impaired flexibility, impaired tone, postural dysfunction, and pain.   ACTIVITY LIMITATIONS: bending, standing, stairs, transfers, bed mobility, and locomotion level  PARTICIPATION LIMITATIONS: community activity and yard work  PERSONAL FACTORS: Age, Behavior pattern, Past/current experiences, Time since onset of  injury/illness/exacerbation, and 3+ comorbidities: PD, Anxiety, HTN, HLD, multiple back surgeries/fusion neck and lumbar spine,  NSTEMI, 01/2020, hx of multiple syncopal episodes  are also affecting patient's functional outcome.   REHAB POTENTIAL: Good  CLINICAL DECISION MAKING: Evolving/moderate complexity  EVALUATION COMPLEXITY: Moderate  PLAN:  PT FREQUENCY: 2x/week  PT DURATION: 12 weeks  PLANNED INTERVENTIONS: Therapeutic exercises, Therapeutic activity, Neuromuscular re-education, Balance training, Gait training, Patient/Family education, Self Care, Stair training, Vestibular training, DME instructions, Aquatic Therapy, Manual therapy, and Re-evaluation  PLAN FOR NEXT SESSION: Add to HEP as needed. Review fall prevention. Review standing PWR moves. [employer]. Work on balance, turning. SLS, unlevel surfaces, reactive stepping. Will start aquatics after Christmas. Gait and turns w/surge  Arliss Journey, PT, DPT  12/23/2021, 3:43 PM

## 2021-12-26 ENCOUNTER — Ambulatory Visit: Payer: Medicare HMO | Admitting: Physical Therapy

## 2021-12-26 DIAGNOSIS — M6281 Muscle weakness (generalized): Secondary | ICD-10-CM

## 2021-12-26 DIAGNOSIS — R2689 Other abnormalities of gait and mobility: Secondary | ICD-10-CM

## 2021-12-26 DIAGNOSIS — R2681 Unsteadiness on feet: Secondary | ICD-10-CM | POA: Diagnosis not present

## 2021-12-26 NOTE — Therapy (Signed)
OUTPATIENT PHYSICAL THERAPY NEURO TREATMENT   Patient Name: Joshua [name] MRN: [id number] DOB:03-Jan-1946, 76 y.o., male Today's Date: 12/26/2021   PCP: Ginger Organ., MD   REFERRING PROVIDER:  Carles Collet Eustace Quail, DO  END OF SESSION:  PT End of Session - 12/26/21 1323     Visit Number 6    Number of Visits 17    Date for PT Re-Evaluation 02/26/22   due to delay in scheduling   Authorization Type [employer]    PT Start Time 1320    PT Stop Time 1401    PT Time Calculation (min) 41 min    Equipment Utilized During Treatment Gait belt    Activity Tolerance Patient tolerated treatment well    Behavior During Therapy [facility] for tasks assessed/performed              Past Medical History:  Diagnosis Date   Allergic rhinitis    Allergy    Anal fissure    Anxiety    from chronic pain from surgery- on Cymbalta   Arthritis    Asthma    Barrett's esophagus 03/29/2014   Carotid artery disease ([place])    Carotid Doppler normal August, 2007   Cataract    Chest pain    Coronaries normal 1996 /  nuclear..06/2002..normal...EF  56% /  stress echo.. May, 2011.... no  scar or ischemia... rate related RBBB   Diverticulosis    Dyslipidemia    Ejection fraction    EF 60%, stress echo, May, 2011   GERD (gastroesophageal reflux disease)    Headache    History of loop recorder    has since 04/04/15   HTN (hypertension)    takes Metoprolol for PVC control   Hx of colonic polyps    adenomatous   Hx of colonoscopy    Hyperlipidemia    IFG (impaired fasting glucose)    Palpitations    Benign PVCs   Parkinson disease    Prostate cancer (HCC)    RBBB (right bundle branch block)    rate related   Shingles    Stroke [facility])    TIA   TIA (transient ischemic attack) 02/2015   Per pt, had 2 strokes   Tremor    Hand tremor   Vertigo    Past Surgical History:  Procedure Laterality Date   BACK SURGERY  2002,2009   x 6   CHOLECYSTECTOMY  11/30/2012   with IOC    COLONOSCOPY     ELECTROPHYSIOLOGIC STUDY N/A 09/26/2015   Procedure: V Tach Ablation (PVC);  Surgeon: Will Meredith Leeds, MD;  Location: [place] [facility];  Service: Cardiovascular;  Laterality: N/A;   EP IMPLANTABLE DEVICE N/A 04/04/2015   Procedure: Loop Recorder Insertion;  Surgeon: Will Meredith Leeds, MD;  Location: [place] [facility];  Service: Cardiovascular;  Laterality: N/A;   HERNIA REPAIR     laprascopic   KNEE SURGERY     DECEMBER 2017,LEFT KNEE SCOPED   LEFT HEART CATH AND CORONARY ANGIOGRAPHY N/A 02/05/2021   Procedure: LEFT HEART CATH AND CORONARY ANGIOGRAPHY;  Surgeon: Wellington Hampshire, MD;  Location: [place] [facility];  Service: Cardiovascular;  Laterality: N/A;   NECK SURGERY  2002   POLYPECTOMY     ROTATOR CUFF REPAIR Left    TEE WITHOUT CARDIOVERSION N/A 04/04/2015   Procedure: TRANSESOPHAGEAL ECHOCARDIOGRAM (TEE);  Surgeon: Lelon Perla, MD;  Location: [facility] ENDOSCOPY;  Service: Cardiovascular;  Laterality: N/A;   TRIGGER FINGER RELEASE  Left 12/28/2019   Procedure: RELEASE TRIGGER FINGER/A-1 PULLEY THUMB, MIDDLE AND RING;  Surgeon: Daryll Brod, MD;  Location: [place];  Service: Orthopedics;  Laterality: Left;  FAB   UPPER GASTROINTESTINAL ENDOSCOPY     V Tach ablation  09/26/2015   Patient Active Problem List   Diagnosis Date Noted   Coronary artery disease involving native coronary artery of native heart without angina pectoris 05/28/2021   History of myocardial infarction 02/05/2021   Parkinson's disease    Malignant neoplasm of prostate ([place]) 11/01/2017   Essential tremor 12/06/2015   PVC (premature ventricular contraction) 65/46/5035   Embolic stroke involving left middle cerebral artery ([place]) 04/07/2015   Acute CVA (cerebrovascular accident) ([place]) 03/06/2015   Stroke (cerebrum) ([place]) 03/05/2015   CVA (cerebral infarction) 03/05/2015   Weakness of right upper extremity    Tremor    Ejection fraction    Vertigo    Sleep apnea     Palpitations    Hyperlipidemia with target LDL less than 70    GERD (gastroesophageal reflux disease)    HTN (hypertension)    Chest pain    RBBB (right bundle branch block)    Ejection fraction    Carotid artery disease ([place])     ONSET DATE: 11/18/2021  REFERRING DIAG: G20.A1 (ICD-10-CM) - Parkinson's disease without dyskinesia or fluctuating manifestations   THERAPY DIAG:  Other abnormalities of gait and mobility  Unsteadiness on feet  Muscle weakness (generalized)  Rationale for Evaluation and Treatment: Rehabilitation  SUBJECTIVE:                                                                                                                                                                                             SUBJECTIVE STATEMENT: Pt reports he is having a rough day, L knee is really throbbing. Doing his HEP and it is going well.  Pt accompanied by: self  PERTINENT HISTORY: PMH: PD, Anxiety, HTN, HLD, multiple back surgeries/fusion neck and lumbar spine,  NSTEMI, 01/2020, hx of multiple syncopal episodes  PAIN:  Bilateral knees are hurting at a 6-7/10 pain and low back pain at 4/10  Will occasionally have pain in L knee if he steps a certain way.   Has a hx of 7 back surgeries, has sore knees and gets injections in both. Also soreness in R hand and if he moves the R hand wrong it will be sharp. "Whole body will hurt from time to time"   PRECAUTIONS: Fall  WEIGHT BEARING RESTRICTIONS: No  FALLS: Has patient fallen in last 6 months? No  LIVING ENVIRONMENT: Lives with: lives with their  spouse Lives in: House/apartment - [place]: No Has following equipment at home: Single point cane, Walker - 4 wheeled, Grab bars, and transport chair Has a lift chair and a lift bed.   PLOF: Independent Reports things like using a screwdriver or trying to get a nail in takes him a lot longer now. Wife has to help him a lot with these tasks.   PATIENT GOALS: Wants to  work on his balance.   OBJECTIVE:   TODAY'S TREATMENT:         Ther Ex  SciFit level 1.5 for 8 minutes using BUE/BLEs for neural priming for reciprocal movement, dynamic cardiovascular warmup and increased amplitude of stepping.   NMR  In // bars for improved postural control, ankle strategy and reactive balance strategies:  On rockerboard in A/P direction, fwd and retro weight shifts without UE support x3 minutes. Min cues for upright posture, but pt frequently losing balance posteriorly when standing tall on board, requiring min A and intermittent UE support to correct.  On rocker board, cone cross-body reach and stack, x9 cones per side for improved lateral reach, rotation and dynamic standing balance. Pt frequently losing balance posteriorly without awareness, requiring min A to stabilize.  On rocker board, ball tosses using dowel w/second therapist for improved reactive balance, visual scanning and reaching out of BOS. Pt required min A throughout due to posterior lean preference and delayed righting reactions. Progressed to cognitive dual-task (naming foods) and pt had significant difficulty thinking of foods, requiring 15-20s between each ball toss despite therapist providing hints of foods to help patient. No increase in LOB noted w/dual task  Sit <>stands on Airex without UE support for immediate standing balance, x5 reps. Pt required UE support on first rep due to retropulsion but performed the remainder of reps well. Trial 1 rep on rockerboard in A/P direction and pt able to perform without UE support w/good stability in standing.    GAIT: Gait pattern: step through pattern, decreased arm swing- Right, decreased stride length, decreased trunk rotation, and trunk flexed Distance walked: Clinic distances  Assistive device utilized: Single point cane and None Level of assistance: SBA Comments: Pt ambulates into session holding SPC, ambulated during session with no AD. Min cues for open  hands throughout                      PATIENT EDUCATION: Education details: Continue HEP Person educated: Patient Education method: Consulting civil engineer, Demonstration, and Verbal cues Education comprehension: verbalized understanding and returned demonstration  HOME EXERCISE PROGRAM: Standing PWR moves  Access Code: [email or web address] URL: [email or web address] Date: 12/08/2021 Prepared by: [place] with Counter Support  - 1 x daily - 5 x weekly - 1-2 sets - 10 reps - Standing Single Leg Stance with Counter Support  - 1 x daily - 5 x weekly - 3 sets - 10-15 hold - Standing Balance with Eyes Closed on Foam  - 1 x daily - 5 x weekly - 3 sets - 30 hold - Romberg Stance on Foam Pad with Head Rotation and Head Nods  - 1 x daily - 5 x weekly - 2 sets - 10 reps    GOALS: Goals reviewed with patient? Yes  SHORT TERM GOALS: Target date: 12/26/2021  Pt will be independent with initial HEP for PD specific deficits in order to build upon functional gains made in therapy. Baseline: pt reports performing his HEP  Goal status: MET  2.   Pt will verbalize understanding of fall prevention in the home environment.  Baseline:  Goal status: INITIAL  3.  Pt will finish assessment of miniBEST with LTG written. Baseline: 20/28 Goal status: MET  4.  Pt will improve gait speed with LRAD to at least 2.7 ft/sec in order to demo improved community mobility.   Baseline: 14.2 seconds with cane =2.31 ft/sec; 12.94 seconds = 2.53 ft/sec on 12/23/21 Goal status: NOT MET    LONG TERM GOALS: Target date: 01/23/2022  Pt will be independent with final HEP for PD specific deficits in order to build upon functional gains made in therapy. Baseline:  Goal status: INITIAL  2.  Pt will improve miniBEST to at least a 23/28 in order to demo decr fall risk.  Baseline: 20/28 Goal status: INITIAL  3.  Pt will verbalize understanding of local Parkinson's disease resources,  including options for continued community fitness. Baseline:  Goal status: INITIAL  4.  Pt will improve condition 4 of mCTSIB to at least 15 seconds in order to demo improved vestibular input for balance.  Baseline: 5.4 seconds Goal status: INITIAL  5.  Pt will perform at least 10 sit <> stand reps with proper technique and no episodes of retropulsion in order to demo improved transfer efficiency.  Baseline:  Goal status: INITIAL  6.   Pt will improve gait speed with LRAD to at least 3.0 ft/sec in order to demo improved community mobility.  Baseline: 14.2 seconds with cane =2.31 ft/sec Goal status: INITIAL  ASSESSMENT:  CLINICAL IMPRESSION: Emphasis of skilled PT session on ankle strategy, postural control and immediate standing balance. Pt reported high levels of knee pain today, so reduced strain on knees throughout as possible. Pt continues to demonstrate posterior lean on unlevel surfaces with delayed righting reactions, requiring min A for stabilization.  Pt had greatest difficulty w/cog dual-task during balance activity despite being provided cues by therapist. Continue POC.    OBJECTIVE IMPAIRMENTS: Abnormal gait, decreased activity tolerance, decreased balance, decreased coordination, decreased mobility, difficulty walking, decreased ROM, decreased strength, impaired flexibility, impaired tone, postural dysfunction, and pain.   ACTIVITY LIMITATIONS: bending, standing, stairs, transfers, bed mobility, and locomotion level  PARTICIPATION LIMITATIONS: community activity and yard work  PERSONAL FACTORS: Age, Behavior pattern, Past/current experiences, Time since onset of injury/illness/exacerbation, and 3+ comorbidities: PD, Anxiety, HTN, HLD, multiple back surgeries/fusion neck and lumbar spine,  NSTEMI, 01/2020, hx of multiple syncopal episodes  are also affecting patient's functional outcome.   REHAB POTENTIAL: Good  CLINICAL DECISION MAKING: Evolving/moderate  complexity  EVALUATION COMPLEXITY: Moderate  PLAN:  PT FREQUENCY: 2x/week  PT DURATION: 12 weeks  PLANNED INTERVENTIONS: Therapeutic exercises, Therapeutic activity, Neuromuscular re-education, Balance training, Gait training, Patient/Family education, Self Care, Stair training, Vestibular training, DME instructions, Aquatic Therapy, Manual therapy, and Re-evaluation  PLAN FOR NEXT SESSION: Check last STG. Add to HEP as needed. Review fall prevention. Review standing PWR moves. SciFit. Work on balance, turning. SLS, unlevel surfaces, reactive stepping. Will start aquatics after Christmas. Gait and turns w/surge    Cruzita Lederer Olliver Boyadjian, PT, DPT  12/26/2021, 2:02 PM

## 2021-12-29 ENCOUNTER — Ambulatory Visit: Payer: Medicare HMO | Admitting: Physical Therapy

## 2021-12-29 ENCOUNTER — Encounter: Payer: Self-pay | Admitting: Physical Therapy

## 2021-12-29 DIAGNOSIS — R29818 Other symptoms and signs involving the nervous system: Secondary | ICD-10-CM

## 2021-12-29 DIAGNOSIS — R2681 Unsteadiness on feet: Secondary | ICD-10-CM

## 2021-12-29 DIAGNOSIS — R2689 Other abnormalities of gait and mobility: Secondary | ICD-10-CM

## 2021-12-29 DIAGNOSIS — M6281 Muscle weakness (generalized): Secondary | ICD-10-CM

## 2021-12-29 NOTE — Therapy (Signed)
OUTPATIENT PHYSICAL THERAPY NEURO TREATMENT   Patient Name: Joshua [name] MRN: [id number] DOB:12-29-1945, 76 y.o., male Today's Date: 12/30/2021   PCP: Ginger Organ., MD   REFERRING PROVIDER:  Carles Collet Eustace Quail, DO  END OF SESSION:  PT End of Session - 12/29/21 1452     Visit Number 7    Number of Visits 17    Date for PT Re-Evaluation 02/26/22   due to delay in scheduling   Authorization Type [employer]    PT Start Time 1451    PT Stop Time 1530    PT Time Calculation (min) 39 min    Equipment Utilized During Treatment Gait belt    Activity Tolerance Patient tolerated treatment well    Behavior During Therapy [facility] for tasks assessed/performed              Past Medical History:  Diagnosis Date   Allergic rhinitis    Allergy    Anal fissure    Anxiety    from chronic pain from surgery- on Cymbalta   Arthritis    Asthma    Barrett's esophagus 03/29/2014   Carotid artery disease ([place])    Carotid Doppler normal August, 2007   Cataract    Chest pain    Coronaries normal 1996 /  nuclear..06/2002..normal...EF  56% /  stress echo.. May, 2011.... no  scar or ischemia... rate related RBBB   Diverticulosis    Dyslipidemia    Ejection fraction    EF 60%, stress echo, May, 2011   GERD (gastroesophageal reflux disease)    Headache    History of loop recorder    has since 04/04/15   HTN (hypertension)    takes Metoprolol for PVC control   Hx of colonic polyps    adenomatous   Hx of colonoscopy    Hyperlipidemia    IFG (impaired fasting glucose)    Palpitations    Benign PVCs   Parkinson disease    Prostate cancer (HCC)    RBBB (right bundle branch block)    rate related   Shingles    Stroke [facility])    TIA   TIA (transient ischemic attack) 02/2015   Per pt, had 2 strokes   Tremor    Hand tremor   Vertigo    Past Surgical History:  Procedure Laterality Date   BACK SURGERY  2002,2009   x 6   CHOLECYSTECTOMY  11/30/2012   with IOC    COLONOSCOPY     ELECTROPHYSIOLOGIC STUDY N/A 09/26/2015   Procedure: V Tach Ablation (PVC);  Surgeon: Will Meredith Leeds, MD;  Location: [place] [facility];  Service: Cardiovascular;  Laterality: N/A;   EP IMPLANTABLE DEVICE N/A 04/04/2015   Procedure: Loop Recorder Insertion;  Surgeon: Will Meredith Leeds, MD;  Location: [place] [facility];  Service: Cardiovascular;  Laterality: N/A;   HERNIA REPAIR     laprascopic   KNEE SURGERY     DECEMBER 2017,LEFT KNEE SCOPED   LEFT HEART CATH AND CORONARY ANGIOGRAPHY N/A 02/05/2021   Procedure: LEFT HEART CATH AND CORONARY ANGIOGRAPHY;  Surgeon: Wellington Hampshire, MD;  Location: [place] [facility];  Service: Cardiovascular;  Laterality: N/A;   NECK SURGERY  2002   POLYPECTOMY     ROTATOR CUFF REPAIR Left    TEE WITHOUT CARDIOVERSION N/A 04/04/2015   Procedure: TRANSESOPHAGEAL ECHOCARDIOGRAM (TEE);  Surgeon: Lelon Perla, MD;  Location: [facility] ENDOSCOPY;  Service: Cardiovascular;  Laterality: N/A;   TRIGGER FINGER RELEASE  Left 12/28/2019   Procedure: RELEASE TRIGGER FINGER/A-1 PULLEY THUMB, MIDDLE AND RING;  Surgeon: Daryll Brod, MD;  Location: [place];  Service: Orthopedics;  Laterality: Left;  FAB   UPPER GASTROINTESTINAL ENDOSCOPY     V Tach ablation  09/26/2015   Patient Active Problem List   Diagnosis Date Noted   Coronary artery disease involving native coronary artery of native heart without angina pectoris 05/28/2021   History of myocardial infarction 02/05/2021   Parkinson's disease    Malignant neoplasm of prostate ([place]) 11/01/2017   Essential tremor 12/06/2015   PVC (premature ventricular contraction) 29/93/7169   Embolic stroke involving left middle cerebral artery ([place]) 04/07/2015   Acute CVA (cerebrovascular accident) ([place]) 03/06/2015   Stroke (cerebrum) ([place]) 03/05/2015   CVA (cerebral infarction) 03/05/2015   Weakness of right upper extremity    Tremor    Ejection fraction    Vertigo    Sleep apnea     Palpitations    Hyperlipidemia with target LDL less than 70    GERD (gastroesophageal reflux disease)    HTN (hypertension)    Chest pain    RBBB (right bundle branch block)    Ejection fraction    Carotid artery disease ([place])     ONSET DATE: 11/18/2021  REFERRING DIAG: G20.A1 (ICD-10-CM) - Parkinson's disease without dyskinesia or fluctuating manifestations   THERAPY DIAG:  Other abnormalities of gait and mobility  Unsteadiness on feet  Muscle weakness (generalized)  Other symptoms and signs involving the nervous system  Rationale for Evaluation and Treatment: Rehabilitation  SUBJECTIVE:                                                                                                                                                                                             SUBJECTIVE STATEMENT: Had a good weekend at his grandson's birthday party. Had a busy morning and feeling a little more tired today.   Pt accompanied by: self  PERTINENT HISTORY: PMH: PD, Anxiety, HTN, HLD, multiple back surgeries/fusion neck and lumbar spine,  NSTEMI, 01/2020, hx of multiple syncopal episodes  PAIN:  Bilateral knees are hurting at a 6/10 pain and low back pain at 4/10  Will occasionally have pain in L knee if he steps a certain way.   Has a hx of 7 back surgeries, has sore knees and gets injections in both. Also soreness in R hand and if he moves the R hand wrong it will be sharp. "Whole body will hurt from time to time"   PRECAUTIONS: Fall  WEIGHT BEARING RESTRICTIONS: No  FALLS: Has patient fallen in last 6 months?  No  LIVING ENVIRONMENT: Lives with: lives with their spouse Lives in: House/apartment - [place]: No Has following equipment at home: Single point cane, Environmental consultant - 4 wheeled, Grab bars, and transport chair Has a lift chair and a lift bed.   PLOF: Independent Reports things like using a screwdriver or trying to get a nail in takes him a lot longer now. Wife has  to help him a lot with these tasks.   PATIENT GOALS: Wants to work on his balance.   OBJECTIVE:   TODAY'S TREATMENT:           NMR  Pt performs PWR! Moves in Standing position on air ex x10 reps    PWR! Up for improved posture  PWR! Rock for improved weightshifting, cued to look up at hands, pt needing to use chair at times for balance   PWR! Twist for improved trunk rotation   PWR! Step for improved step initiation, cued for incr foot clearance, cued to look out at hands and to perform more slowly. A couple instances of min guard/min A for balance    Standing Balance: Surface: Airex Position: Feet Hip Width Apart> together  Completed with: EC 3 x 30 seconds, pt with incr postural sway with feet closer together and pt needing to tap walls for balance. Pt with incr lean to the R.   With feet hip width distance EC x10 reps head turns, x10 reps head nods   Single Leg Stance:   Surface: Airex  Lower Extremity: BLE  Alternating slow marching x10 reps each side, able to perform with no UE support  In // bars: With 5 blaze pods on random setting on blue foam beam for improved weight shifitng, SLS, foot clearance Side stepping and tapping pod in front of pt, cued for slowed pace and making sure pt was directly in front of blaze pod before tapping, performed x2 minutes  With one leg as SLS on blue foam beam, tapping in a semi-circle, performed x2 minutes with each leg. Pt more challenged with SLS on LLE. Pt reporting incr R knee pain afterwards. Needing intermittent UE support for balance   On rockberboard in A/P direction: Alternating SLS taps to 2 cones, alternating forward direction 10 reps each side, 10 reps cross body taps, needing intermittent UE support for balance  10 reps mini squats and then standing with tall posture, min guard/min A while standing behind pt due to pt almost losing balance posteriorly a couple times, pt able to improve with incr reps    GAIT: Gait  pattern: step through pattern, decreased arm swing- Right, decreased stride length, decreased trunk rotation, and trunk flexed Distance walked: Clinic distances  Assistive device utilized: Single point cane and None Level of assistance: SBA Comments: Pt ambulates into session holding SPC, ambulated during session with no AD. Min cues for open hands throughout                      PATIENT EDUCATION: Education details: Continue HEP - can try performing balance with EC  and EC with head motions on foam at home in the corner  Person educated: Patient Education method: Explanation, Demonstration, and Verbal cues Education comprehension: verbalized understanding and returned demonstration  HOME EXERCISE PROGRAM: Standing PWR moves  Access Code: [email or web address] URL: [email or web address] Date: 12/08/2021 Prepared by: Janann August  Exercises - Heel Toe Raises with Counter Support  - 1 x daily - 5 x weekly - 1-2 sets - 10  reps - Standing Single Leg Stance with Counter Support  - 1 x daily - 5 x weekly - 3 sets - 10-15 hold - Standing Balance with Eyes Closed on Foam  - 1 x daily - 5 x weekly - 3 sets - 30 hold - Romberg Stance on Foam Pad with Head Rotation and Head Nods  - 1 x daily - 5 x weekly - 2 sets - 10 reps  Verbally added balance with EC on foam for 30 seconds and EC with feet apart x10 reps head turns, x10 reps head nods    GOALS: Goals reviewed with patient? Yes  SHORT TERM GOALS: Target date: 12/26/2021  Pt will be independent with initial HEP for PD specific deficits in order to build upon functional gains made in therapy. Baseline: pt reports performing his HEP  Goal status: MET  2.   Pt will verbalize understanding of fall prevention in the home environment.  Baseline:  Goal status: MET  3.  Pt will finish assessment of miniBEST with LTG written. Baseline: 20/28 Goal status: MET  4.  Pt will improve gait speed with LRAD to at least 2.7 ft/sec in order to  demo improved community mobility.   Baseline: 14.2 seconds with cane =2.31 ft/sec; 12.94 seconds = 2.53 ft/sec on 12/23/21 Goal status: NOT MET    LONG TERM GOALS: Target date: 01/23/2022  Pt will be independent with final HEP for PD specific deficits in order to build upon functional gains made in therapy. Baseline:  Goal status: INITIAL  2.  Pt will improve miniBEST to at least a 23/28 in order to demo decr fall risk.  Baseline: 20/28 Goal status: INITIAL  3.  Pt will verbalize understanding of local Parkinson's disease resources, including options for continued community fitness. Baseline:  Goal status: INITIAL  4.  Pt will improve condition 4 of mCTSIB to at least 15 seconds in order to demo improved vestibular input for balance.  Baseline: 5.4 seconds Goal status: INITIAL  5.  Pt will perform at least 10 sit <> stand reps with proper technique and no episodes of retropulsion in order to demo improved transfer efficiency.  Baseline:  Goal status: INITIAL  6.   Pt will improve gait speed with LRAD to at least 3.0 ft/sec in order to demo improved community mobility.  Baseline: 14.2 seconds with cane =2.31 ft/sec Goal status: INITIAL  ASSESSMENT:  CLINICAL IMPRESSION: Today's skilled session focused on balance strategies on unlevel surfaces with focus on weight shifting, stepping, SLS tasks, and vestibular input for balance. Pt with incr postural sway with balance with EC on foam, verbally added this exercise to pt's HEP for him to practice at home. Pt continues to be challenged by SLS tasks on compliant surfaces, esp with LLE. Will continue to progress LTGs.    OBJECTIVE IMPAIRMENTS: Abnormal gait, decreased activity tolerance, decreased balance, decreased coordination, decreased mobility, difficulty walking, decreased ROM, decreased strength, impaired flexibility, impaired tone, postural dysfunction, and pain.   ACTIVITY LIMITATIONS: bending, standing, stairs, transfers,  bed mobility, and locomotion level  PARTICIPATION LIMITATIONS: community activity and yard work  PERSONAL FACTORS: Age, Behavior pattern, Past/current experiences, Time since onset of injury/illness/exacerbation, and 3+ comorbidities: PD, Anxiety, HTN, HLD, multiple back surgeries/fusion neck and lumbar spine,  NSTEMI, 01/2020, hx of multiple syncopal episodes  are also affecting patient's functional outcome.   REHAB POTENTIAL: Good  CLINICAL DECISION MAKING: Evolving/moderate complexity  EVALUATION COMPLEXITY: Moderate  PLAN:  PT FREQUENCY: 2x/week  PT DURATION: 12  weeks  PLANNED INTERVENTIONS: Therapeutic exercises, Therapeutic activity, Neuromuscular re-education, Balance training, Gait training, Patient/Family education, Self Care, Stair training, Vestibular training, DME instructions, Aquatic Therapy, Manual therapy, and Re-evaluation  PLAN FOR NEXT SESSION:  Add to HEP as needed.  SciFit. Work on balance, turning. SLS, unlevel surfaces, reactive stepping. Will start aquatics after Christmas. Gait and turns w/surge. Try standing PWR flow.    Arliss Journey, PT, DPT  12/30/2021, 1:03 PM

## 2021-12-31 ENCOUNTER — Encounter: Payer: Self-pay | Admitting: Physical Therapy

## 2021-12-31 ENCOUNTER — Ambulatory Visit: Payer: Medicare HMO | Admitting: Physical Therapy

## 2021-12-31 DIAGNOSIS — M6281 Muscle weakness (generalized): Secondary | ICD-10-CM

## 2021-12-31 DIAGNOSIS — R2689 Other abnormalities of gait and mobility: Secondary | ICD-10-CM

## 2021-12-31 DIAGNOSIS — R2681 Unsteadiness on feet: Secondary | ICD-10-CM | POA: Diagnosis not present

## 2021-12-31 DIAGNOSIS — R29818 Other symptoms and signs involving the nervous system: Secondary | ICD-10-CM

## 2021-12-31 DIAGNOSIS — R293 Abnormal posture: Secondary | ICD-10-CM

## 2021-12-31 NOTE — Therapy (Signed)
OUTPATIENT PHYSICAL THERAPY NEURO TREATMENT   Patient Name: Joshua [name] MRN: [id number] DOB:22-May-1945, 76 y.o., male Today's Date: 12/31/2021   PCP: Ginger Organ., MD   REFERRING PROVIDER:  Carles Collet Eustace Quail, DO  END OF SESSION:  PT End of Session - 12/31/21 1319     Visit Number 8    Number of Visits 17    Date for PT Re-Evaluation 02/26/22   due to delay in scheduling   Authorization Type [employer]    PT Start Time 1318    PT Stop Time 1358    PT Time Calculation (min) 40 min    Equipment Utilized During Treatment Gait belt    Activity Tolerance Patient tolerated treatment well    Behavior During Therapy [facility] for tasks assessed/performed              Past Medical History:  Diagnosis Date   Allergic rhinitis    Allergy    Anal fissure    Anxiety    from chronic pain from surgery- on Cymbalta   Arthritis    Asthma    Barrett's esophagus 03/29/2014   Carotid artery disease ([place])    Carotid Doppler normal August, 2007   Cataract    Chest pain    Coronaries normal 1996 /  nuclear..06/2002..normal...EF  56% /  stress echo.. May, 2011.... no  scar or ischemia... rate related RBBB   Diverticulosis    Dyslipidemia    Ejection fraction    EF 60%, stress echo, May, 2011   GERD (gastroesophageal reflux disease)    Headache    History of loop recorder    has since 04/04/15   HTN (hypertension)    takes Metoprolol for PVC control   Hx of colonic polyps    adenomatous   Hx of colonoscopy    Hyperlipidemia    IFG (impaired fasting glucose)    Palpitations    Benign PVCs   Parkinson disease    Prostate cancer (HCC)    RBBB (right bundle branch block)    rate related   Shingles    Stroke [facility])    TIA   TIA (transient ischemic attack) 02/2015   Per pt, had 2 strokes   Tremor    Hand tremor   Vertigo    Past Surgical History:  Procedure Laterality Date   BACK SURGERY  2002,2009   x 6   CHOLECYSTECTOMY  11/30/2012   with IOC    COLONOSCOPY     ELECTROPHYSIOLOGIC STUDY N/A 09/26/2015   Procedure: V Tach Ablation (PVC);  Surgeon: Will Meredith Leeds, MD;  Location: [place] [facility];  Service: Cardiovascular;  Laterality: N/A;   EP IMPLANTABLE DEVICE N/A 04/04/2015   Procedure: Loop Recorder Insertion;  Surgeon: Will Meredith Leeds, MD;  Location: [place] [facility];  Service: Cardiovascular;  Laterality: N/A;   HERNIA REPAIR     laprascopic   KNEE SURGERY     DECEMBER 2017,LEFT KNEE SCOPED   LEFT HEART CATH AND CORONARY ANGIOGRAPHY N/A 02/05/2021   Procedure: LEFT HEART CATH AND CORONARY ANGIOGRAPHY;  Surgeon: Wellington Hampshire, MD;  Location: [place] [facility];  Service: Cardiovascular;  Laterality: N/A;   NECK SURGERY  2002   POLYPECTOMY     ROTATOR CUFF REPAIR Left    TEE WITHOUT CARDIOVERSION N/A 04/04/2015   Procedure: TRANSESOPHAGEAL ECHOCARDIOGRAM (TEE);  Surgeon: Lelon Perla, MD;  Location: [facility] ENDOSCOPY;  Service: Cardiovascular;  Laterality: N/A;   TRIGGER FINGER RELEASE  Left 12/28/2019   Procedure: RELEASE TRIGGER FINGER/A-1 PULLEY THUMB, MIDDLE AND RING;  Surgeon: Daryll Brod, MD;  Location: [place];  Service: Orthopedics;  Laterality: Left;  FAB   UPPER GASTROINTESTINAL ENDOSCOPY     V Tach ablation  09/26/2015   Patient Active Problem List   Diagnosis Date Noted   Coronary artery disease involving native coronary artery of native heart without angina pectoris 05/28/2021   History of myocardial infarction 02/05/2021   Parkinson's disease    Malignant neoplasm of prostate ([place]) 11/01/2017   Essential tremor 12/06/2015   PVC (premature ventricular contraction) 19/16/6060   Embolic stroke involving left middle cerebral artery ([place]) 04/07/2015   Acute CVA (cerebrovascular accident) ([place]) 03/06/2015   Stroke (cerebrum) ([place]) 03/05/2015   CVA (cerebral infarction) 03/05/2015   Weakness of right upper extremity    Tremor    Ejection fraction    Vertigo    Sleep apnea     Palpitations    Hyperlipidemia with target LDL less than 70    GERD (gastroesophageal reflux disease)    HTN (hypertension)    Chest pain    RBBB (right bundle branch block)    Ejection fraction    Carotid artery disease ([place])     ONSET DATE: 11/18/2021  REFERRING DIAG: G20.A1 (ICD-10-CM) - Parkinson's disease without dyskinesia or fluctuating manifestations   THERAPY DIAG:  Other abnormalities of gait and mobility  Unsteadiness on feet  Muscle weakness (generalized)  Other symptoms and signs involving the nervous system  Abnormal posture  Rationale for Evaluation and Treatment: Rehabilitation  SUBJECTIVE:                                                                                                                                                                                             SUBJECTIVE STATEMENT: Nothing new, no changes.   Pt accompanied by: self  PERTINENT HISTORY: PMH: PD, Anxiety, HTN, HLD, multiple back surgeries/fusion neck and lumbar spine,  NSTEMI, 01/2020, hx of multiple syncopal episodes  PAIN:  Bilateral knees are hurting at a 6/10 pain and low back pain at 4/10  Will occasionally have pain in L knee if he steps a certain way.   Has a hx of 7 back surgeries, has sore knees and gets injections in both. Also soreness in R hand and if he moves the R hand wrong it will be sharp. "Whole body will hurt from time to time"   PRECAUTIONS: Fall  WEIGHT BEARING RESTRICTIONS: No  FALLS: Has patient fallen in last 6 months? No  LIVING ENVIRONMENT: Lives with: lives with their spouse Lives in: House/apartment -  Town Home Stairs: No Has following equipment at home: Single point cane, Walker - 4 wheeled, Grab bars, and transport chair Has a lift chair and a lift bed.   PLOF: Independent Reports things like using a screwdriver or trying to get a nail in takes him a lot longer now. Wife has to help him a lot with these tasks.   PATIENT GOALS: Wants to  work on his balance.   OBJECTIVE:   TODAY'S TREATMENT:          Ther Ex  SciFit level 1.5 for 5 minutes using BUE/BLEs for neural priming for reciprocal movement, dynamic cardiovascular warmup and increased amplitude of stepping. Pt reporting some discomfort in R knee and requesting to stop early.   At end of session performed calf stretch 3 x 30 seconds bilat, needs cues for tall posture and extending back leg. Pt has been doing this at home, cued to hold for 30 seconds when performing   NMR  Working on turns for improved stability with gait:  Wide U turns over 30', performing to the R x6 reps and to the L x6 reps, cues to take a bigger step with the outside leg. Pt did well performing this way. Discussed to perform this at home when having to make a wide turn or around curves for improved stability as pt reports sometimes he will bump into the wall when going around a curve at home. With remainder of gait during session when going around curves in the track, same cues to take a bigger step with outside leg with pt able to perform well.  Clock turns (to 12:00, 3:00, 9:00) with focusing on being able to change direction as pt reports that he can get his legs caught up and step with the inside leg and feel off balance, cues to step big with the outside leg and to face towards the direction of the clock and have some space between his legs when stepping, pt having more narrow BOS at times and needing min guard. Pt did well when cued for wider BOS, performed x10 reps going to R/L. Educated to really focus on leading with outside leg. Standing on red balance beam sit <> stands for immediate balance x5 reps , then progressed to sit<> stands on rockerboard in A/P direction x10 reps, pt initially needing more reps to perform, but improved when pt would go more slowly and cued for tall posture in standing.   In // bars With 4 larger orange obstacles; performed step to pattern down and back x2 reps with  cues for incr hip/knee flexion vs. Cirucmduction, then performed alternating pattern down and back x2 reps. Added cognitive challenge by pt naming movies/songs that have to do with christmas. When performing with dual tasking, pt with tendency for step to vs. Alternating pattern when stepping. Needing intermittent UE support for balance  Standing on purple beam: lateral step and weight shift and then back to midline x10 reps each side, pt with incr fatigue with this.   GAIT: Gait pattern: step through pattern, decreased arm swing- Right, decreased stride length, decreased trunk rotation, and trunk flexed Distance walked: 230' plus additional clinic distances  Assistive device utilized: Single point cane and None Level of assistance: SBA Comments: Pt ambulates into session holding SPC, ambulated during session with no AD. Min cues for open hands throughout, posture, and arm swing esp with RUE.  PATIENT EDUCATION: Education details: Continue HEP, strategies with turns  Person educated: Patient Education method: Explanation, Demonstration, and Verbal cues Education comprehension: verbalized understanding and returned demonstration  HOME EXERCISE PROGRAM: Standing PWR moves  Access Code: [email or web address] URL: [email or web address] Date: 12/08/2021 Prepared by: Janann August  Exercises - Heel Toe Raises with Counter Support  - 1 x daily - 5 x weekly - 1-2 sets - 10 reps - Standing Single Leg Stance with Counter Support  - 1 x daily - 5 x weekly - 3 sets - 10-15 hold - Standing Balance with Eyes Closed on Foam  - 1 x daily - 5 x weekly - 3 sets - 30 hold - Romberg Stance on Foam Pad with Head Rotation and Head Nods  - 1 x daily - 5 x weekly - 2 sets - 10 reps  Verbally added balance with EC on foam for 30 seconds and EC with feet apart x10 reps head turns, x10 reps head nods    GOALS: Goals reviewed with patient? Yes  SHORT TERM GOALS: Target date:  12/26/2021  Pt will be independent with initial HEP for PD specific deficits in order to build upon functional gains made in therapy. Baseline: pt reports performing his HEP  Goal status: MET  2.   Pt will verbalize understanding of fall prevention in the home environment.  Baseline:  Goal status: MET  3.  Pt will finish assessment of miniBEST with LTG written. Baseline: 20/28 Goal status: MET  4.  Pt will improve gait speed with LRAD to at least 2.7 ft/sec in order to demo improved community mobility.   Baseline: 14.2 seconds with cane =2.31 ft/sec; 12.94 seconds = 2.53 ft/sec on 12/23/21 Goal status: NOT MET    LONG TERM GOALS: Target date: 01/23/2022  Pt will be independent with final HEP for PD specific deficits in order to build upon functional gains made in therapy. Baseline:  Goal status: INITIAL  2.  Pt will improve miniBEST to at least a 23/28 in order to demo decr fall risk.  Baseline: 20/28 Goal status: INITIAL  3.  Pt will verbalize understanding of local Parkinson's disease resources, including options for continued community fitness. Baseline:  Goal status: INITIAL  4.  Pt will improve condition 4 of mCTSIB to at least 15 seconds in order to demo improved vestibular input for balance.  Baseline: 5.4 seconds Goal status: INITIAL  5.  Pt will perform at least 10 sit <> stand reps with proper technique and no episodes of retropulsion in order to demo improved transfer efficiency.  Baseline:  Goal status: INITIAL  6.   Pt will improve gait speed with LRAD to at least 3.0 ft/sec in order to demo improved community mobility.  Baseline: 14.2 seconds with cane =2.31 ft/sec Goal status: INITIAL  ASSESSMENT:  CLINICAL IMPRESSION: Today's skilled session focused on standing balance strategies on compliant surfaces for stepping, immediate standing balance and strategies with turning. With turning focused on wide based U turns as well as turns from a static position  when changing direction to focus on leading with the outside leg. When at home, pt tends to step across body with the inside leg as this will have a tendency for him to be off balance. Pt responded well to cues and pt did demo improved balance with turns today. Pt to trial these strategies at home.Will continue to progress LTGs.    OBJECTIVE IMPAIRMENTS: Abnormal gait, decreased activity tolerance, decreased balance, decreased coordination, decreased mobility, difficulty walking,  decreased ROM, decreased strength, impaired flexibility, impaired tone, postural dysfunction, and pain.   ACTIVITY LIMITATIONS: bending, standing, stairs, transfers, bed mobility, and locomotion level  PARTICIPATION LIMITATIONS: community activity and yard work  PERSONAL FACTORS: Age, Behavior pattern, Past/current experiences, Time since onset of injury/illness/exacerbation, and 3+ comorbidities: PD, Anxiety, HTN, HLD, multiple back surgeries/fusion neck and lumbar spine,  NSTEMI, 01/2020, hx of multiple syncopal episodes  are also affecting patient's functional outcome.   REHAB POTENTIAL: Good  CLINICAL DECISION MAKING: Evolving/moderate complexity  EVALUATION COMPLEXITY: Moderate  PLAN:  PT FREQUENCY: 2x/week  PT DURATION: 12 weeks  PLANNED INTERVENTIONS: Therapeutic exercises, Therapeutic activity, Neuromuscular re-education, Balance training, Gait training, Patient/Family education, Self Care, Stair training, Vestibular training, DME instructions, Aquatic Therapy, Manual therapy, and Re-evaluation  PLAN FOR NEXT SESSION:  Add to HEP as needed.  SciFit. Work on balance, turning. SLS, unlevel surfaces, reactive stepping. Will start aquatics after Christmas. Gait and turns w/surge. Try standing PWR flow.    Arliss Journey, PT, DPT  12/31/2021, 2:20 PM

## 2022-01-06 ENCOUNTER — Other Ambulatory Visit: Payer: Self-pay

## 2022-01-06 MED ORDER — MEXILETINE HCL 150 MG PO CAPS: 150.0000 mg | ORAL_CAPSULE | Freq: Two times a day (BID) | ORAL | 3 refills | Status: DC

## 2022-01-06 NOTE — Telephone Encounter (Signed)
Pt's medication was sent to pt's pharmacy as requested. Confirmation received.  °

## 2022-01-07 ENCOUNTER — Ambulatory Visit: Payer: Medicare HMO | Admitting: Physical Therapy

## 2022-01-07 ENCOUNTER — Encounter: Payer: Self-pay | Admitting: Physical Therapy

## 2022-01-07 DIAGNOSIS — R2681 Unsteadiness on feet: Secondary | ICD-10-CM

## 2022-01-07 DIAGNOSIS — M6281 Muscle weakness (generalized): Secondary | ICD-10-CM

## 2022-01-07 DIAGNOSIS — R2689 Other abnormalities of gait and mobility: Secondary | ICD-10-CM

## 2022-01-07 DIAGNOSIS — R29818 Other symptoms and signs involving the nervous system: Secondary | ICD-10-CM

## 2022-01-07 NOTE — Therapy (Signed)
OUTPATIENT PHYSICAL THERAPY NEURO TREATMENT   Patient Name: Joshua [name] MRN: [id number] DOB:[birth year], 76 y.o., male Today's Date: 01/07/2022   PCP: Ginger Organ., MD   REFERRING PROVIDER:  Carles Collet Eustace Quail, DO  END OF SESSION:  PT End of Session - 01/07/22 1320     Visit Number 9    Number of Visits 17    Date for PT Re-Evaluation 02/26/22   due to delay in scheduling   Authorization Type [employer]    PT Start Time 1319    PT Stop Time 1359    PT Time Calculation (min) 40 min    Equipment Utilized During Treatment Gait belt    Activity Tolerance Patient tolerated treatment well    Behavior During Therapy [facility] for tasks assessed/performed              Past Medical History:  Diagnosis Date   Allergic rhinitis    Allergy    Anal fissure    Anxiety    from chronic pain from surgery- on Cymbalta   Arthritis    Asthma    Barrett's esophagus 03/29/2014   Carotid artery disease ([place])    Carotid Doppler normal August, 2007   Cataract    Chest pain    Coronaries normal 1996 /  nuclear..06/2002..normal...EF  56% /  stress echo.. May, 2011.... no  scar or ischemia... rate related RBBB   Diverticulosis    Dyslipidemia    Ejection fraction    EF 60%, stress echo, May, 2011   GERD (gastroesophageal reflux disease)    Headache    History of loop recorder    has since 04/04/15   HTN (hypertension)    takes Metoprolol for PVC control   Hx of colonic polyps    adenomatous   Hx of colonoscopy    Hyperlipidemia    IFG (impaired fasting glucose)    Palpitations    Benign PVCs   Parkinson disease    Prostate cancer (HCC)    RBBB (right bundle branch block)    rate related   Shingles    Stroke [facility])    TIA   TIA (transient ischemic attack) 02/2015   Per pt, had 2 strokes   Tremor    Hand tremor   Vertigo    Past Surgical History:  Procedure Laterality Date   BACK SURGERY  2002,2009   x 6   CHOLECYSTECTOMY  11/30/2012   with IOC    COLONOSCOPY     ELECTROPHYSIOLOGIC STUDY N/A 09/26/2015   Procedure: V Tach Ablation (PVC);  Surgeon: Will Meredith Leeds, MD;  Location: [place] [facility];  Service: Cardiovascular;  Laterality: N/A;   EP IMPLANTABLE DEVICE N/A 04/04/2015   Procedure: Loop Recorder Insertion;  Surgeon: Will Meredith Leeds, MD;  Location: [place] [facility];  Service: Cardiovascular;  Laterality: N/A;   HERNIA REPAIR     laprascopic   KNEE SURGERY     DECEMBER 2017,LEFT KNEE SCOPED   LEFT HEART CATH AND CORONARY ANGIOGRAPHY N/A 02/05/2021   Procedure: LEFT HEART CATH AND CORONARY ANGIOGRAPHY;  Surgeon: Wellington Hampshire, MD;  Location: [place] [facility];  Service: Cardiovascular;  Laterality: N/A;   NECK SURGERY  2002   POLYPECTOMY     ROTATOR CUFF REPAIR Left    TEE WITHOUT CARDIOVERSION N/A 04/04/2015   Procedure: TRANSESOPHAGEAL ECHOCARDIOGRAM (TEE);  Surgeon: Lelon Perla, MD;  Location: [facility] ENDOSCOPY;  Service: Cardiovascular;  Laterality: N/A;   TRIGGER FINGER RELEASE  Left 12/28/2019   Procedure: RELEASE TRIGGER FINGER/A-1 PULLEY THUMB, MIDDLE AND RING;  Surgeon: Daryll Brod, MD;  Location: [place];  Service: Orthopedics;  Laterality: Left;  FAB   UPPER GASTROINTESTINAL ENDOSCOPY     V Tach ablation  09/26/2015   Patient Active Problem List   Diagnosis Date Noted   Coronary artery disease involving native coronary artery of native heart without angina pectoris 05/28/2021   History of myocardial infarction 02/05/2021   Parkinson's disease    Malignant neoplasm of prostate ([place]) 11/01/2017   Essential tremor 12/06/2015   PVC (premature ventricular contraction) 37/85/8850   Embolic stroke involving left middle cerebral artery ([place]) 04/07/2015   Acute CVA (cerebrovascular accident) ([place]) 03/06/2015   Stroke (cerebrum) ([place]) 03/05/2015   CVA (cerebral infarction) 03/05/2015   Weakness of right upper extremity    Tremor    Ejection fraction    Vertigo    Sleep apnea     Palpitations    Hyperlipidemia with target LDL less than 70    GERD (gastroesophageal reflux disease)    HTN (hypertension)    Chest pain    RBBB (right bundle branch block)    Ejection fraction    Carotid artery disease ([place])     ONSET DATE: 11/18/2021  REFERRING DIAG: G20.A1 (ICD-10-CM) - Parkinson's disease without dyskinesia or fluctuating manifestations   THERAPY DIAG:  Other abnormalities of gait and mobility  Unsteadiness on feet  Other symptoms and signs involving the nervous system  Muscle weakness (generalized)  Rationale for Evaluation and Treatment: Rehabilitation  SUBJECTIVE:                                                                                                                                                                                             SUBJECTIVE STATEMENT: No changes, no falls. Reports has not been bumping into walls at home after working on turning at home. Can potentially start aquatic therapy the week of August 8th   Pt accompanied by: self  PERTINENT HISTORY: PMH: PD, Anxiety, HTN, HLD, multiple back surgeries/fusion neck and lumbar spine,  NSTEMI, 01/2020, hx of multiple syncopal episodes  PAIN:  Bilateral knees are hurting at a 5/10 pain.  Will occasionally have pain in L knee if he steps a certain way.   Has a hx of 7 back surgeries, has sore knees and gets injections in both. Also soreness in R hand and if he moves the R hand wrong it will be sharp. "Whole body will hurt from time to time"   PRECAUTIONS: Fall  WEIGHT BEARING RESTRICTIONS: No  FALLS: Has patient fallen in  last 6 months? No  LIVING ENVIRONMENT: Lives with: lives with their spouse Lives in: House/apartment - [place]: No Has following equipment at home: Single point cane, Environmental consultant - 4 wheeled, Grab bars, and transport chair Has a lift chair and a lift bed.   PLOF: Independent Reports things like using a screwdriver or trying to get a nail in takes  him a lot longer now. Wife has to help him a lot with these tasks.   PATIENT GOALS: Wants to work on his balance.   OBJECTIVE:   TODAY'S TREATMENT:          NMR   Standing PWR Flow (Up>Rock>Twist> Step) x6 reps with demo and verbal cues from PT and x3 reps without therapist providing demonstrative cues (pt did well with this), intermittent cues for wider BOS and incr step height with PWR Step  Alternating forward stepping with rebounder and tossing green ball and catching it for reactive balance, stepping strategies, and visual scanning adding in cognitive challenge with pt naming foods from A-Z and adding in balance on air ex for compliant surface, performed approx 20 reps each side. Pt did well with foot clearance on air ex. Needs intermittent cues at times on what food to name as pt with tendency to stop balance task to think of an object.  Resisted forwards walking with 2nd PT providing resistance and other PT providing min guard/min A for balance, 115' with steady resistance and 3' with random resistance with pt needing to utilize stepping strategy to maintain. A couple episodes of min A for balance due to pt needing to take multiple steps, but overall pt able to keep his balance. Cues throughout for posture and relaxed arm swing.  On 2 pillows with feet together EC 2 x 30 seconds, min postural sway to the L, pt able to perform without UE support  On black side of BOSU: Weight shifting R/L and A/P x15 reps each direction, cues for posture  10 reps head turns, 10 reps head nods - pt more challenged with head nods due to pt's legs getting more fatigued  Pt needing frequent taps to bars for balance and min A at times due to pt losing balance posteriorly.    GAIT: Gait pattern: step through pattern, decreased arm swing- Right, decreased stride length, decreased trunk rotation, and trunk flexed Distance walked: Clinic distances  Assistive device utilized: Single point cane and None Level of  assistance: SBA Comments: Pt ambulates into session holding SPC, ambulated during session with no AD. Min cues for open hands throughout, posture, and arm swing esp with RUE.                      PATIENT EDUCATION: Education details: Provided handout on PD community resources, pt to look further into this at home and see if he has any questions  Person educated: Patient Education method: Explanation, Demonstration, Verbal cues, and Handouts Education comprehension: verbalized understanding and returned demonstration  HOME EXERCISE PROGRAM: Standing PWR moves  Access Code: [email or web address] URL: [email or web address] Date: 12/08/2021 Prepared by: [place] with Counter Support  - 1 x daily - 5 x weekly - 1-2 sets - 10 reps - Standing Single Leg Stance with Counter Support  - 1 x daily - 5 x weekly - 3 sets - 10-15 hold - Standing Balance with Eyes Closed on Foam  - 1 x daily - 5 x weekly - 3 sets - 30  hold - Romberg Stance on Foam Pad with Head Rotation and Head Nods  - 1 x daily - 5 x weekly - 2 sets - 10 reps  Verbally added balance with EC on foam for 30 seconds and EC with feet apart x10 reps head turns, x10 reps head nods    GOALS: Goals reviewed with patient? Yes  SHORT TERM GOALS: Target date: 12/26/2021  Pt will be independent with initial HEP for PD specific deficits in order to build upon functional gains made in therapy. Baseline: pt reports performing his HEP  Goal status: MET  2.   Pt will verbalize understanding of fall prevention in the home environment.  Baseline:  Goal status: MET  3.  Pt will finish assessment of miniBEST with LTG written. Baseline: 20/28 Goal status: MET  4.  Pt will improve gait speed with LRAD to at least 2.7 ft/sec in order to demo improved community mobility.   Baseline: 14.2 seconds with cane =2.31 ft/sec; 12.94 seconds = 2.53 ft/sec on 12/23/21 Goal status: NOT MET    LONG TERM GOALS:  Target date: 01/23/2022  Pt will be independent with final HEP for PD specific deficits in order to build upon functional gains made in therapy. Baseline:  Goal status: INITIAL  2.  Pt will improve miniBEST to at least a 23/28 in order to demo decr fall risk.  Baseline: 20/28 Goal status: INITIAL  3.  Pt will verbalize understanding of local Parkinson's disease resources, including options for continued community fitness. Baseline:  Goal status: INITIAL  4.  Pt will improve condition 4 of mCTSIB to at least 15 seconds in order to demo improved vestibular input for balance.  Baseline: 5.4 seconds Goal status: INITIAL  5.  Pt will perform at least 10 sit <> stand reps with proper technique and no episodes of retropulsion in order to demo improved transfer efficiency.  Baseline:  Goal status: INITIAL  6.   Pt will improve gait speed with LRAD to at least 3.0 ft/sec in order to demo improved community mobility.  Baseline: 14.2 seconds with cane =2.31 ft/sec Goal status: INITIAL  ASSESSMENT:  CLINICAL IMPRESSION: Today's skilled session focused on standing balance strategies on compliant surfaces, stepping and reactive balance, and standing PWR Flow for cognition/larger amplitude movement patterns. Pt needing min A  at times for balance during resisted gait, esp when given random perturbations when pt would need to use multiple steps to keep his balance. Pt tolerated session well and demonstrated improved balance esp on compliant surfaces. Will continue to progress LTGs.    OBJECTIVE IMPAIRMENTS: Abnormal gait, decreased activity tolerance, decreased balance, decreased coordination, decreased mobility, difficulty walking, decreased ROM, decreased strength, impaired flexibility, impaired tone, postural dysfunction, and pain.   ACTIVITY LIMITATIONS: bending, standing, stairs, transfers, bed mobility, and locomotion level  PARTICIPATION LIMITATIONS: community activity and yard  work  PERSONAL FACTORS: Age, Behavior pattern, Past/current experiences, Time since onset of injury/illness/exacerbation, and 3+ comorbidities: PD, Anxiety, HTN, HLD, multiple back surgeries/fusion neck and lumbar spine,  NSTEMI, 01/2020, hx of multiple syncopal episodes  are also affecting patient's functional outcome.   REHAB POTENTIAL: Good  CLINICAL DECISION MAKING: Evolving/moderate complexity  EVALUATION COMPLEXITY: Moderate  PLAN:  PT FREQUENCY: 2x/week  PT DURATION: 12 weeks  PLANNED INTERVENTIONS: Therapeutic exercises, Therapeutic activity, Neuromuscular re-education, Balance training, Gait training, Patient/Family education, Self Care, Stair training, Vestibular training, DME instructions, Aquatic Therapy, Manual therapy, and Re-evaluation  PLAN FOR NEXT SESSION:  Will need 10th visit PN. Add  to HEP as needed.  Sci[employer]. Work on balance, turning. SLS, unlevel surfaces, reactive stepping. Going to ask suzanne about him starting aquatics potential week of jan 8th    Vashti Hey [place], [place], DPT  01/07/2022, 2:03 PM

## 2022-01-09 ENCOUNTER — Ambulatory Visit: Payer: Medicare HMO | Admitting: Physical Therapy

## 2022-01-09 DIAGNOSIS — R2681 Unsteadiness on feet: Secondary | ICD-10-CM | POA: Diagnosis not present

## 2022-01-09 DIAGNOSIS — R2689 Other abnormalities of gait and mobility: Secondary | ICD-10-CM

## 2022-01-09 NOTE — Therapy (Signed)
OUTPATIENT PHYSICAL THERAPY NEURO TREATMENT- 10TH VISIT PROGRESS NOTE   Patient Name: Joshua [name] MRN: [id number] DOB:[birth year], 76 y.o., male Today's Date: 01/09/2022   PCP: Ginger Organ., MD   REFERRING PROVIDER:  Ludwig Clarks, DO  Physical Therapy Progress Note   Dates of Reporting Period: 11/28/21 - 01/09/22  See Note below for Objective Data and Assessment of Progress/Goals.  Thank you for the referral of this patient. Mickie Bail Gari Hartsell, PT, DPT   END OF SESSION:  PT End of Session - 01/09/22 1305     Visit Number 10    Number of Visits 17    Date for PT Re-Evaluation 02/26/22   due to delay in scheduling   Authorization Type [employer]    PT Start Time 1304    PT Stop Time 1352    PT Time Calculation (min) 48 min    Equipment Utilized During Treatment Gait belt    Activity Tolerance Patient tolerated treatment well    Behavior During Therapy WFL for tasks assessed/performed              Past Medical History:  Diagnosis Date   Allergic rhinitis    Allergy    Anal fissure    Anxiety    from chronic pain from surgery- on Cymbalta   Arthritis    Asthma    Barrett's esophagus 03/29/2014   Carotid artery disease ([place])    Carotid Doppler normal August, 2007   Cataract    Chest pain    Coronaries normal 1996 /  nuclear..06/2002..normal...EF  56% /  stress echo.. May, 2011.... no  scar or ischemia... rate related RBBB   Diverticulosis    Dyslipidemia    Ejection fraction    EF 60%, stress echo, May, 2011   GERD (gastroesophageal reflux disease)    Headache    History of loop recorder    has since 04/04/15   HTN (hypertension)    takes Metoprolol for PVC control   Hx of colonic polyps    adenomatous   Hx of colonoscopy    Hyperlipidemia    IFG (impaired fasting glucose)    Palpitations    Benign PVCs   Parkinson disease    Prostate cancer (HCC)    RBBB (right bundle branch block)    rate related   Shingles    Stroke  [facility])    TIA   TIA (transient ischemic attack) 02/2015   Per pt, had 2 strokes   Tremor    Hand tremor   Vertigo    Past Surgical History:  Procedure Laterality Date   BACK SURGERY  2002,2009   x 6   CHOLECYSTECTOMY  11/30/2012   with IOC   COLONOSCOPY     ELECTROPHYSIOLOGIC STUDY N/A 09/26/2015   Procedure: V Tach Ablation (PVC);  Surgeon: Will Meredith Leeds, MD;  Location: [place] [facility];  Service: Cardiovascular;  Laterality: N/A;   EP IMPLANTABLE DEVICE N/A 04/04/2015   Procedure: Loop Recorder Insertion;  Surgeon: Will Meredith Leeds, MD;  Location: [place] [facility];  Service: Cardiovascular;  Laterality: N/A;   HERNIA REPAIR     laprascopic   KNEE SURGERY     DECEMBER 2017,LEFT KNEE SCOPED   LEFT HEART CATH AND CORONARY ANGIOGRAPHY N/A 02/05/2021   Procedure: LEFT HEART CATH AND CORONARY ANGIOGRAPHY;  Surgeon: Wellington Hampshire, MD;  Location: [place] [facility];  Service: Cardiovascular;  Laterality: N/A;   NECK SURGERY  2002   POLYPECTOMY  ROTATOR CUFF REPAIR Left    TEE WITHOUT CARDIOVERSION N/A 04/04/2015   Procedure: TRANSESOPHAGEAL ECHOCARDIOGRAM (TEE);  Surgeon: Lelon Perla, MD;  Location: [facility] ENDOSCOPY;  Service: Cardiovascular;  Laterality: N/A;   TRIGGER FINGER RELEASE Left 12/28/2019   Procedure: RELEASE TRIGGER FINGER/A-1 PULLEY THUMB, MIDDLE AND RING;  Surgeon: Daryll Brod, MD;  Location: [facility];  Service: Orthopedics;  Laterality: Left;  FAB   UPPER GASTROINTESTINAL ENDOSCOPY     V Tach ablation  09/26/2015   Patient Active Problem List   Diagnosis Date Noted   Coronary artery disease involving native coronary artery of native heart without angina pectoris 05/28/2021   History of myocardial infarction 02/05/2021   Parkinson's disease    Malignant neoplasm of prostate ([place]) 11/01/2017   Essential tremor 12/06/2015   PVC (premature ventricular contraction) 07/37/1062   Embolic stroke involving left middle cerebral artery ([place])  04/07/2015   Acute CVA (cerebrovascular accident) ([place]) 03/06/2015   Stroke (cerebrum) ([place]) 03/05/2015   CVA (cerebral infarction) 03/05/2015   Weakness of right upper extremity    Tremor    Ejection fraction    Vertigo    Sleep apnea    Palpitations    Hyperlipidemia with target LDL less than 70    GERD (gastroesophageal reflux disease)    HTN (hypertension)    Chest pain    RBBB (right bundle branch block)    Ejection fraction    Carotid artery disease ([place])     ONSET DATE: 11/18/2021  REFERRING DIAG: G20.A1 (ICD-10-CM) - Parkinson's disease without dyskinesia or fluctuating manifestations   THERAPY DIAG:  Unsteadiness on feet  Other abnormalities of gait and mobility  Rationale for Evaluation and Treatment: Rehabilitation  SUBJECTIVE:                                                                                                                                                                                             SUBJECTIVE STATEMENT: No changes, no falls. Reports feeling more off balance today, ambulated into clinic with his cane. Pt reports he gets vertigo if he lays on his left side, which makes him feel very off.   Pt accompanied by: self  PERTINENT HISTORY: PMH: PD, Anxiety, HTN, HLD, multiple back surgeries/fusion neck and lumbar spine,  NSTEMI, 01/2020, hx of multiple syncopal episodes  PAIN:  Bilateral knees are hurting at a 5/10 pain.  Will occasionally have pain in L knee if he steps a certain way.   Has a hx of 7 back surgeries, has sore knees and gets injections in both. Also soreness in R hand and if he moves  the R hand wrong it will be sharp. "Whole body will hurt from time to time"   PRECAUTIONS: Fall  WEIGHT BEARING RESTRICTIONS: No  FALLS: Has patient fallen in last 6 months? No  LIVING ENVIRONMENT: Lives with: lives with their spouse Lives in: House/apartment - [place]: No Has following equipment at home: Single point cane,  Environmental consultant - 4 wheeled, Grab bars, and transport chair Has a lift chair and a lift bed.   PLOF: Independent Reports things like using a screwdriver or trying to get a nail in takes him a lot longer now. Wife has to help him a lot with these tasks.   PATIENT GOALS: Wants to work on his balance.   OBJECTIVE:   TODAY'S TREATMENT:         Ther Act  Lengthy discussion regarding pt's dizziness and history of vertigo. Pt reports he has never been treated for BPPV, but does have "head-hanging" exercises he does when he feels dizzy that helps. Educated pt on BPPV vs hypofunction given pt's long history of migraines, "tin foil" vision and dizziness. Encouraged pt to bring spouse with him if he would like to be tested for BPPV, pt verbalized understanding.  Educated pt on progression of idiopathic PD vs PSP, as he is getting skin biopsy next week. Pt verbalized understanding.   Vestibular Assessment: Smooth pursuit: impaired on L side  Saccades in horizontal and vertical directions: normal   NMR  Standing VOR x1 in vertical direction, 1x30s, for improved vestibular input and determination of dizziness. Pt unable to perform quickly due to poor gaze stabilization. Noted minor A/P sway but no LOB. Did not attempt horizontal direction as pt reports this makes him very dizzy and he drove to appointment alone.     GAIT: Gait pattern: step through pattern, decreased arm swing- Right, decreased stride length, decreased trunk rotation, and trunk flexed Distance walked: Clinic distances  Assistive device utilized: Single point cane and None Level of assistance: SBA Comments: Pt ambulates into session holding SPC, ambulated during session with no AD. Min cues for open hands throughout, posture, and arm swing esp with RUE.                      PATIENT EDUCATION: Education details: see above Person educated: Patient Education method: Explanation, Demonstration, Verbal cues, and Handouts Education  comprehension: verbalized understanding and returned demonstration  HOME EXERCISE PROGRAM: Standing PWR moves  Access Code: [email or web address] URL: [email or web address] Date: 12/08/2021 Prepared by: Janann August  Exercises - Heel Toe Raises with Counter Support  - 1 x daily - 5 x weekly - 1-2 sets - 10 reps - Standing Single Leg Stance with Counter Support  - 1 x daily - 5 x weekly - 3 sets - 10-15 hold - Standing Balance with Eyes Closed on Foam  - 1 x daily - 5 x weekly - 3 sets - 30 hold - Romberg Stance on Foam Pad with Head Rotation and Head Nods  - 1 x daily - 5 x weekly - 2 sets - 10 reps  Verbally added balance with EC on foam for 30 seconds and EC with feet apart x10 reps head turns, x10 reps head nods    GOALS: Goals reviewed with patient? Yes  SHORT TERM GOALS: Target date: 12/26/2021  Pt will be independent with initial HEP for PD specific deficits in order to build upon functional gains made in therapy. Baseline: pt reports performing his HEP  Goal status: MET  2.   Pt will verbalize understanding of fall prevention in the home environment.  Baseline:  Goal status: MET  3.  Pt will finish assessment of miniBEST with LTG written. Baseline: 20/28 Goal status: MET  4.  Pt will improve gait speed with LRAD to at least 2.7 ft/sec in order to demo improved community mobility.   Baseline: 14.2 seconds with cane =2.31 ft/sec; 12.94 seconds = 2.53 ft/sec on 12/23/21 Goal status: NOT MET    LONG TERM GOALS: Target date: 01/23/2022  Pt will be independent with final HEP for PD specific deficits in order to build upon functional gains made in therapy. Baseline:  Goal status: INITIAL  2.  Pt will improve miniBEST to at least a 23/28 in order to demo decr fall risk.  Baseline: 20/28 Goal status: INITIAL  3.  Pt will verbalize understanding of local Parkinson's disease resources, including options for continued community fitness. Baseline:  Goal status:  INITIAL  4.  Pt will improve condition 4 of mCTSIB to at least 15 seconds in order to demo improved vestibular input for balance.  Baseline: 5.4 seconds Goal status: INITIAL  5.  Pt will perform at least 10 sit <> stand reps with proper technique and no episodes of retropulsion in order to demo improved transfer efficiency.  Baseline:  Goal status: INITIAL  6.   Pt will improve gait speed with LRAD to at least 3.0 ft/sec in order to demo improved community mobility.  Baseline: 14.2 seconds with cane =2.31 ft/sec Goal status: INITIAL  ASSESSMENT:  CLINICAL IMPRESSION: 10th visit PN: Emphasis of skilled PT session on pt education and vestibular input. Pt reported increased dizziness this session and states that turning his head to the L is triggering. Pt in agreement to assess for BPPV in future session, as long as he has his wife with him to drive him home. Pt demonstrates impaired smooth pursuit on L side and significant difficulty w/gaze stabilization. Unable to assess VOR x1 in standing due to slow speed of head movement. Pt has met all of his STG and continues to demonstrate improvement in BLE strength, transfers, turns and gait kinematics. Continue POC.    OBJECTIVE IMPAIRMENTS: Abnormal gait, decreased activity tolerance, decreased balance, decreased coordination, decreased mobility, difficulty walking, decreased ROM, decreased strength, impaired flexibility, impaired tone, postural dysfunction, and pain.   ACTIVITY LIMITATIONS: bending, standing, stairs, transfers, bed mobility, and locomotion level  PARTICIPATION LIMITATIONS: community activity and yard work  PERSONAL FACTORS: Age, Behavior pattern, Past/current experiences, Time since onset of injury/illness/exacerbation, and 3+ comorbidities: PD, Anxiety, HTN, HLD, multiple back surgeries/fusion neck and lumbar spine,  NSTEMI, 01/2020, hx of multiple syncopal episodes  are also affecting patient's functional outcome.   REHAB  POTENTIAL: Good  CLINICAL DECISION MAKING: Evolving/moderate complexity  EVALUATION COMPLEXITY: Moderate  PLAN:  PT FREQUENCY: 2x/week  PT DURATION: 12 weeks  PLANNED INTERVENTIONS: Therapeutic exercises, Therapeutic activity, Neuromuscular re-education, Balance training, Gait training, Patient/Family education, Self Care, Stair training, Vestibular training, DME instructions, Aquatic Therapy, Manual therapy, and Re-evaluation  PLAN FOR NEXT SESSION: Add to HEP as needed.  SciFit. Work on balance, turning. SLS, unlevel surfaces, reactive stepping. Forgot to ask about starting pool on the 9th w/Emily. Assess for BPPV? Vestibular input    Cruzita Lederer Malic Rosten, PT, DPT  01/09/2022, 1:53 PM

## 2022-01-13 ENCOUNTER — Ambulatory Visit: Payer: Medicare HMO | Attending: Neurology | Admitting: Physical Therapy

## 2022-01-13 ENCOUNTER — Encounter: Payer: Self-pay | Admitting: Physical Therapy

## 2022-01-13 DIAGNOSIS — M6281 Muscle weakness (generalized): Secondary | ICD-10-CM | POA: Insufficient documentation

## 2022-01-13 DIAGNOSIS — R2681 Unsteadiness on feet: Secondary | ICD-10-CM | POA: Diagnosis not present

## 2022-01-13 DIAGNOSIS — G20B1 Parkinson's disease with dyskinesia, without mention of fluctuations: Secondary | ICD-10-CM | POA: Diagnosis present

## 2022-01-13 DIAGNOSIS — R278 Other lack of coordination: Secondary | ICD-10-CM | POA: Insufficient documentation

## 2022-01-13 DIAGNOSIS — R2689 Other abnormalities of gait and mobility: Secondary | ICD-10-CM | POA: Diagnosis present

## 2022-01-13 DIAGNOSIS — G250 Essential tremor: Secondary | ICD-10-CM | POA: Diagnosis present

## 2022-01-13 DIAGNOSIS — R29818 Other symptoms and signs involving the nervous system: Secondary | ICD-10-CM | POA: Diagnosis present

## 2022-01-13 DIAGNOSIS — R293 Abnormal posture: Secondary | ICD-10-CM | POA: Insufficient documentation

## 2022-01-13 DIAGNOSIS — R29898 Other symptoms and signs involving the musculoskeletal system: Secondary | ICD-10-CM | POA: Diagnosis present

## 2022-01-13 NOTE — Therapy (Signed)
OUTPATIENT PHYSICAL THERAPY NEURO TREATMENT   Patient Name: Joshua [name] MRN: [id number] DOB:[birth year], 77 y.o., male Today's Date: 01/13/2022   PCP: Ginger Organ., MD   REFERRING PROVIDER:  Carles Collet Eustace Quail, DO     END OF SESSION:  PT End of Session - 01/13/22 1319     Visit Number 11    Number of Visits 17    Date for PT Re-Evaluation 02/26/22   due to delay in scheduling   Authorization Type [employer]    PT Start Time 1318    PT Stop Time 1358    PT Time Calculation (min) 40 min    Equipment Utilized During Treatment Gait belt    Activity Tolerance Patient tolerated treatment well    Behavior During Therapy [facility] for tasks assessed/performed              Past Medical History:  Diagnosis Date   Allergic rhinitis    Allergy    Anal fissure    Anxiety    from chronic pain from surgery- on Cymbalta   Arthritis    Asthma    Barrett's esophagus 03/29/2014   Carotid artery disease ([place])    Carotid Doppler normal August, 2007   Cataract    Chest pain    Coronaries normal 1996 /  nuclear..06/2002..normal...EF  56% /  stress echo.. May, 2011.... no  scar or ischemia... rate related RBBB   Diverticulosis    Dyslipidemia    Ejection fraction    EF 60%, stress echo, May, 2011   GERD (gastroesophageal reflux disease)    Headache    History of loop recorder    has since 04/04/15   HTN (hypertension)    takes Metoprolol for PVC control   Hx of colonic polyps    adenomatous   Hx of colonoscopy    Hyperlipidemia    IFG (impaired fasting glucose)    Palpitations    Benign PVCs   Parkinson disease    Prostate cancer (HCC)    RBBB (right bundle branch block)    rate related   Shingles    Stroke [facility])    TIA   TIA (transient ischemic attack) 02/2015   Per pt, had 2 strokes   Tremor    Hand tremor   Vertigo    Past Surgical History:  Procedure Laterality Date   BACK SURGERY  2002,2009   x 6   CHOLECYSTECTOMY  11/30/2012   with IOC    COLONOSCOPY     ELECTROPHYSIOLOGIC STUDY N/A 09/26/2015   Procedure: V Tach Ablation (PVC);  Surgeon: Will Meredith Leeds, MD;  Location: [place] [facility];  Service: Cardiovascular;  Laterality: N/A;   EP IMPLANTABLE DEVICE N/A 04/04/2015   Procedure: Loop Recorder Insertion;  Surgeon: Will Meredith Leeds, MD;  Location: [place] [facility];  Service: Cardiovascular;  Laterality: N/A;   HERNIA REPAIR     laprascopic   KNEE SURGERY     DECEMBER 2017,LEFT KNEE SCOPED   LEFT HEART CATH AND CORONARY ANGIOGRAPHY N/A 02/05/2021   Procedure: LEFT HEART CATH AND CORONARY ANGIOGRAPHY;  Surgeon: Wellington Hampshire, MD;  Location: [place] [facility];  Service: Cardiovascular;  Laterality: N/A;   NECK SURGERY  2002   POLYPECTOMY     ROTATOR CUFF REPAIR Left    TEE WITHOUT CARDIOVERSION N/A 04/04/2015   Procedure: TRANSESOPHAGEAL ECHOCARDIOGRAM (TEE);  Surgeon: Lelon Perla, MD;  Location: [place];  Service: Cardiovascular;  Laterality: N/A;  TRIGGER FINGER RELEASE Left 12/28/2019   Procedure: RELEASE TRIGGER FINGER/A-1 PULLEY THUMB, MIDDLE AND RING;  Surgeon: Daryll Brod, MD;  Location: [place];  Service: Orthopedics;  Laterality: Left;  FAB   UPPER GASTROINTESTINAL ENDOSCOPY     V Tach ablation  09/26/2015   Patient Active Problem List   Diagnosis Date Noted   Coronary artery disease involving native coronary artery of native heart without angina pectoris 05/28/2021   History of myocardial infarction 02/05/2021   Parkinson's disease    Malignant neoplasm of prostate ([place]) 11/01/2017   Essential tremor 12/06/2015   PVC (premature ventricular contraction) 58/52/7782   Embolic stroke involving left middle cerebral artery ([place]) 04/07/2015   Acute CVA (cerebrovascular accident) ([place]) 03/06/2015   Stroke (cerebrum) ([place]) 03/05/2015   CVA (cerebral infarction) 03/05/2015   Weakness of right upper extremity    Tremor    Ejection fraction    Vertigo    Sleep apnea     Palpitations    Hyperlipidemia with target LDL less than 70    GERD (gastroesophageal reflux disease)    HTN (hypertension)    Chest pain    RBBB (right bundle branch block)    Ejection fraction    Carotid artery disease ([place])     ONSET DATE: 11/18/2021  REFERRING DIAG: G20.A1 (ICD-10-CM) - Parkinson's disease without dyskinesia or fluctuating manifestations   THERAPY DIAG:  Unsteadiness on feet  Other abnormalities of gait and mobility  Muscle weakness (generalized)  Other symptoms and signs involving the nervous system  Rationale for Evaluation and Treatment: Rehabilitation  SUBJECTIVE:                                                                                                                                                                                             SUBJECTIVE STATEMENT: Feeling off balanced at times. Have a "swirly", imbalanced in his head that affects his walking, but does not feel like it is vertigo. Reports does not have vertigo, but does have it occasionally. Reports the days that he does get it, can't look down. Will get some vertigo when he rolls to the L side. Would rather wait to do a vestibular assessment and check for positional testing until someone can bring him. Wants to wait to get scheduled for the pool until a week after he gets his skin biopsy in case he starts bleeding.   Pt accompanied by: self  PERTINENT HISTORY: PMH: PD, Anxiety, HTN, HLD, multiple back surgeries/fusion neck and lumbar spine,  NSTEMI, 01/2020, hx of multiple syncopal episodes  PAIN:  Bilateral knees are hurting at a 5/10 pain.  Will occasionally have pain in L knee if he steps a certain way.   Has a hx of 7 back surgeries, has sore knees and gets injections in both. Also soreness in R hand and if he moves the R hand wrong it will be sharp. "Whole body will hurt from time to time"   PRECAUTIONS: Fall  WEIGHT BEARING RESTRICTIONS: No  FALLS: Has patient fallen in  last 6 months? No  LIVING ENVIRONMENT: Lives with: lives with their spouse Lives in: House/apartment - [place]: No Has following equipment at home: Single point cane, Environmental consultant - 4 wheeled, Grab bars, and transport chair Has a lift chair and a lift bed.   PLOF: Independent Reports things like using a screwdriver or trying to get a nail in takes him a lot longer now. Wife has to help him a lot with these tasks.   PATIENT GOALS: Wants to work on his balance.   OBJECTIVE:   TODAY'S TREATMENT:          NMR  On thick blue foam for improved vestibular input for balance/SLS:  EC with feet hip width > closer together 3 x 30 seconds, pt with incr postural sway with EC  Alternating marching x10 reps, with focus on slowed pace  Forward and cross-body SLS taps to 6" step, focus on slowed and controlled x15 reps each side, pt did really well with this today and only had to use UE support a couple times  EC with wider BOS > feet hip width 2 sets of 10 reps head turns, 2 sets of 10 reps head nods. Pt reporting not feeling any dizziness, just imbalanced and feeling disoriented.   With use of 6 blaze pods on random setting for visual scanning, incr amplitude of gait, and turning strategies; each bout for 2 minutes With pods in a semi-circle: working on turning with leading with the outside leg; 23 hits total. Pt needing cues when stepping/turning to the R to lead with his R outside foot as pt tends to step first with his LLE at times.  With pods in a full circle: continuing to work on turning techniques; 19 hits total. Pt did well with balance.  With pods 3 pods on R and 3 pods on L spread out over approx. 10-15', performed forward/retro gait and then side stepping/turning to hit target, performed 15 hits. Pt did well with step length when ambulating backwards.   GAIT: Gait pattern: step through pattern, decreased arm swing- Right, decreased stride length, decreased trunk rotation, and trunk  flexed Distance walked: Clinic distances  Assistive device utilized: Single point cane and None Level of assistance: SBA Comments: Pt ambulates into session holding SPC, ambulated during session with no AD. Min cues for open hands throughout, posture, and arm swing esp with RUE.                      PATIENT EDUCATION: Education details: Discussed getting pt scheduled for aquatic therapy next week; pt would like to hold off another week since he is getting his skin biopsy this Friday and feels like he could bleed from this. Held pt an appt time for aquatics for week of Jan 15th (pt already has aquatic therapy handout). Pt reporting performing EC exercises on level ground at home, educated on purpose on performing them on something unlevel in the corner to target vestibular system.  Person educated: Patient Education method: Explanation, Demonstration, and Verbal cues Education comprehension: verbalized understanding and  returned demonstration  HOME EXERCISE PROGRAM: Standing PWR moves  Access Code: [email or web address] URL: [email or web address] Date: 12/08/2021 Prepared by: Janann August  Exercises - Heel Toe Raises with Counter Support  - 1 x daily - 5 x weekly - 1-2 sets - 10 reps - Standing Single Leg Stance with Counter Support  - 1 x daily - 5 x weekly - 3 sets - 10-15 hold - Standing Balance with Eyes Closed on Foam  - 1 x daily - 5 x weekly - 3 sets - 30 hold - Romberg Stance on Foam Pad with Head Rotation and Head Nods  - 1 x daily - 5 x weekly - 2 sets - 10 reps  Verbally added balance with EC on foam for 30 seconds and EC with feet apart x10 reps head turns, x10 reps head nods    GOALS: Goals reviewed with patient? Yes  SHORT TERM GOALS: Target date: 12/26/2021  Pt will be independent with initial HEP for PD specific deficits in order to build upon functional gains made in therapy. Baseline: pt reports performing his HEP  Goal status: MET  2.   Pt will verbalize  understanding of fall prevention in the home environment.  Baseline:  Goal status: MET  3.  Pt will finish assessment of miniBEST with LTG written. Baseline: 20/28 Goal status: MET  4.  Pt will improve gait speed with LRAD to at least 2.7 ft/sec in order to demo improved community mobility.   Baseline: 14.2 seconds with cane =2.31 ft/sec; 12.94 seconds = 2.53 ft/sec on 12/23/21 Goal status: NOT MET    LONG TERM GOALS: Target date: 01/23/2022  Pt will be independent with final HEP for PD specific deficits in order to build upon functional gains made in therapy. Baseline:  Goal status: INITIAL  2.  Pt will improve miniBEST to at least a 23/28 in order to demo decr fall risk.  Baseline: 20/28 Goal status: INITIAL  3.  Pt will verbalize understanding of local Parkinson's disease resources, including options for continued community fitness. Baseline:  Goal status: INITIAL  4.  Pt will improve condition 4 of mCTSIB to at least 15 seconds in order to demo improved vestibular input for balance.  Baseline: 5.4 seconds Goal status: INITIAL  5.  Pt will perform at least 10 sit <> stand reps with proper technique and no episodes of retropulsion in order to demo improved transfer efficiency.  Baseline:  Goal status: INITIAL  6.   Pt will improve gait speed with LRAD to at least 3.0 ft/sec in order to demo improved community mobility.  Baseline: 14.2 seconds with cane =2.31 ft/sec Goal status: INITIAL  ASSESSMENT:  CLINICAL IMPRESSION: Pt did not wish to have further vestibular assessment today as pt drove himself to appt. Pt would like to be further assessed in the future as long as he gets a ride to therapy. Today's skilled session worked on turning strategies with quarter and 180 degree turns. Pt did well and did not have any LOB. Worked on balance with incr vestibular input on air ex with pt able to tolerate well with no reports of dizziness, just unsteadiness. Educated on purpose  of performing balance exercises with EC on something unlevel vs. Level ground for HEP. Will continue to progress towards LTGs.   OBJECTIVE IMPAIRMENTS: Abnormal gait, decreased activity tolerance, decreased balance, decreased coordination, decreased mobility, difficulty walking, decreased ROM, decreased strength, impaired flexibility, impaired tone, postural dysfunction, and pain.   ACTIVITY LIMITATIONS: bending, standing, stairs, transfers,  bed mobility, and locomotion level  PARTICIPATION LIMITATIONS: community activity and yard work  PERSONAL FACTORS: Age, Behavior pattern, Past/current experiences, Time since onset of injury/illness/exacerbation, and 3+ comorbidities: PD, Anxiety, HTN, HLD, multiple back surgeries/fusion neck and lumbar spine,  NSTEMI, 01/2020, hx of multiple syncopal episodes  are also affecting patient's functional outcome.   REHAB POTENTIAL: Good  CLINICAL DECISION MAKING: Evolving/moderate complexity  EVALUATION COMPLEXITY: Moderate  PLAN:  PT FREQUENCY: 2x/week  PT DURATION: 12 weeks  PLANNED INTERVENTIONS: Therapeutic exercises, Therapeutic activity, Neuromuscular re-education, Balance training, Gait training, Patient/Family education, Self Care, Stair training, Vestibular training, DME instructions, Aquatic Therapy, Manual therapy, and Re-evaluation  PLAN FOR NEXT SESSION: Add to HEP as needed.  SciFit. Work on balance, turning. SLS, unlevel surfaces, reactive stepping. Assess for BPPV? Vestibular input    Arliss Journey, PT, DPT  01/13/2022, 2:20 PM

## 2022-01-15 ENCOUNTER — Ambulatory Visit: Payer: Medicare HMO | Admitting: Physical Therapy

## 2022-01-15 ENCOUNTER — Encounter: Payer: Medicare HMO | Admitting: Occupational Therapy

## 2022-01-15 VITALS — BP 118/76 | HR 69

## 2022-01-15 DIAGNOSIS — R2681 Unsteadiness on feet: Secondary | ICD-10-CM

## 2022-01-15 DIAGNOSIS — M6281 Muscle weakness (generalized): Secondary | ICD-10-CM

## 2022-01-15 DIAGNOSIS — R2689 Other abnormalities of gait and mobility: Secondary | ICD-10-CM

## 2022-01-15 NOTE — Therapy (Signed)
OUTPATIENT PHYSICAL THERAPY NEURO TREATMENT   Patient Name: Joshua [name] MRN: [id number] DOB:11/30/1945, 77 y.o., male Today's Date: 01/15/2022   PCP: Ginger Organ., MD   REFERRING PROVIDER:  Carles Collet Eustace Quail, DO     END OF SESSION:  PT End of Session - 01/15/22 1317     Visit Number 12    Number of Visits 17    Date for PT Re-Evaluation 02/26/22   due to delay in scheduling   Authorization Type [employer]    PT Start Time 1316    PT Stop Time 1400    PT Time Calculation (min) 44 min    Equipment Utilized During Treatment Gait belt    Activity Tolerance Patient tolerated treatment well    Behavior During Therapy [facility] for tasks assessed/performed               Past Medical History:  Diagnosis Date   Allergic rhinitis    Allergy    Anal fissure    Anxiety    from chronic pain from surgery- on Cymbalta   Arthritis    Asthma    Barrett's esophagus 03/29/2014   Carotid artery disease ([place])    Carotid Doppler normal August, 2007   Cataract    Chest pain    Coronaries normal 1996 /  nuclear..06/2002..normal...EF  56% /  stress echo.. May, 2011.... no  scar or ischemia... rate related RBBB   Diverticulosis    Dyslipidemia    Ejection fraction    EF 60%, stress echo, May, 2011   GERD (gastroesophageal reflux disease)    Headache    History of loop recorder    has since 04/04/15   HTN (hypertension)    takes Metoprolol for PVC control   Hx of colonic polyps    adenomatous   Hx of colonoscopy    Hyperlipidemia    IFG (impaired fasting glucose)    Palpitations    Benign PVCs   Parkinson disease    Prostate cancer (HCC)    RBBB (right bundle branch block)    rate related   Shingles    Stroke [facility])    TIA   TIA (transient ischemic attack) 02/2015   Per pt, had 2 strokes   Tremor    Hand tremor   Vertigo    Past Surgical History:  Procedure Laterality Date   BACK SURGERY  2002,2009   x 6   CHOLECYSTECTOMY  11/30/2012   with IOC    COLONOSCOPY     ELECTROPHYSIOLOGIC STUDY N/A 09/26/2015   Procedure: V Tach Ablation (PVC);  Surgeon: Will Meredith Leeds, MD;  Location: [place] [facility];  Service: Cardiovascular;  Laterality: N/A;   EP IMPLANTABLE DEVICE N/A 04/04/2015   Procedure: Loop Recorder Insertion;  Surgeon: Will Meredith Leeds, MD;  Location: [place] [facility];  Service: Cardiovascular;  Laterality: N/A;   HERNIA REPAIR     laprascopic   KNEE SURGERY     DECEMBER 2017,LEFT KNEE SCOPED   LEFT HEART CATH AND CORONARY ANGIOGRAPHY N/A 02/05/2021   Procedure: LEFT HEART CATH AND CORONARY ANGIOGRAPHY;  Surgeon: Wellington Hampshire, MD;  Location: [place] [facility];  Service: Cardiovascular;  Laterality: N/A;   NECK SURGERY  2002   POLYPECTOMY     ROTATOR CUFF REPAIR Left    TEE WITHOUT CARDIOVERSION N/A 04/04/2015   Procedure: TRANSESOPHAGEAL ECHOCARDIOGRAM (TEE);  Surgeon: Lelon Perla, MD;  Location: [place];  Service: Cardiovascular;  Laterality: N/A;  TRIGGER FINGER RELEASE Left 12/28/2019   Procedure: RELEASE TRIGGER FINGER/A-1 PULLEY THUMB, MIDDLE AND RING;  Surgeon: Daryll Brod, MD;  Location: [place];  Service: Orthopedics;  Laterality: Left;  FAB   UPPER GASTROINTESTINAL ENDOSCOPY     V Tach ablation  09/26/2015   Patient Active Problem List   Diagnosis Date Noted   Coronary artery disease involving native coronary artery of native heart without angina pectoris 05/28/2021   History of myocardial infarction 02/05/2021   Parkinson's disease    Malignant neoplasm of prostate ([place]) 11/01/2017   Essential tremor 12/06/2015   PVC (premature ventricular contraction) 02/72/5366   Embolic stroke involving left middle cerebral artery ([place]) 04/07/2015   Acute CVA (cerebrovascular accident) ([place]) 03/06/2015   Stroke (cerebrum) ([place]) 03/05/2015   CVA (cerebral infarction) 03/05/2015   Weakness of right upper extremity    Tremor    Ejection fraction    Vertigo    Sleep apnea     Palpitations    Hyperlipidemia with target LDL less than 70    GERD (gastroesophageal reflux disease)    HTN (hypertension)    Chest pain    RBBB (right bundle branch block)    Ejection fraction    Carotid artery disease ([place])     ONSET DATE: 11/18/2021  REFERRING DIAG: G20.A1 (ICD-10-CM) - Parkinson's disease without dyskinesia or fluctuating manifestations   THERAPY DIAG:  Unsteadiness on feet  Other abnormalities of gait and mobility  Muscle weakness (generalized)  Rationale for Evaluation and Treatment: Rehabilitation  SUBJECTIVE:                                                                                                                                                                                             SUBJECTIVE STATEMENT: Pt reports having sore knees today. Tried his EC exercises on an old orthopedic pillow yesterday and couldn't do it well, kept losing balance backwards so he stopped. No falls.   Pt accompanied by: self  PERTINENT HISTORY: PMH: PD, Anxiety, HTN, HLD, multiple back surgeries/fusion neck and lumbar spine,  NSTEMI, 01/2020, hx of multiple syncopal episodes  PAIN:  Bilateral knees are hurting at a 5/10 pain.  Will occasionally have pain in L knee if he steps a certain way.   Has a hx of 7 back surgeries, has sore knees and gets injections in both. Also soreness in R hand and if he moves the R hand wrong it will be sharp. "Whole body will hurt from time to time"   VITALS Vitals:   01/15/22 1325  BP: 118/76  Pulse: 69     PRECAUTIONS: Fall  WEIGHT BEARING RESTRICTIONS: No  FALLS: Has patient fallen in last 6 months? No  LIVING ENVIRONMENT: Lives with: lives with their spouse Lives in: House/apartment - [place]: No Has following equipment at home: Single point cane, Environmental consultant - 4 wheeled, Grab bars, and transport chair Has a lift chair and a lift bed.   PLOF: Independent Reports things like using a screwdriver or trying to  get a nail in takes him a lot longer now. Wife has to help him a lot with these tasks.   PATIENT GOALS: Wants to work on his balance.   OBJECTIVE:   TODAY'S TREATMENT:         There Ex  SciFit multi-peaks level 2 for 8 minutes using BUE/BLEs for neural priming for reciprocal movement, dynamic cardiovascular warmup and increased amplitude of stepping. RPE of 7/10 following activity (mostly in legs)   NMR  In // bars for improved side stepping and midline orientation: Side stepping on purple beam, x2 down and back w/intermittent UE support and min A for posterior lean correction. Pt exhibited significant difficulty stabilizing L foot, noted he did not have AFO on today. Pt reported he felt as though his LLE was going to give out, so regressed to seated exercise for remainder of session   Ther Ex (cont.)  Seated on mat, BAPs board in A/P, L/R, clockwise and counterclockwise position, 15 each direction on L foot, for improved lateral ankle stability and proprioceptive input. Noted significant difficulty w/calcaneal eversion, w/pt relying on lateral trunk lean to perform on board. Pt reported discomfort in L quad following activity  Sit <>stands x6 without UE support and w/LLE elevated on blue dynadisc for improved ankle stability and BLE strength, CGA for safety. Pt able to to perform well without LOB or instability.  Progressed to 2 reps w/BLEs elevated on blue dynadiscs for added challenge, min A for safety due to quad weakness and posterior lean. Pt required multiple attempts to perform 2 reps but was able to perform well once he shifted his weight fwd.   GAIT: Gait pattern: step through pattern, decreased arm swing- Right, decreased stride length, decreased trunk rotation, and trunk flexed Distance walked: Clinic distances  Assistive device utilized: None Level of assistance: SBA Comments: Min cues for open hands throughout, posture, and arm swing esp with RUE.                      PATIENT  EDUCATION: Education details: Briefly discussed POC moving forward, as pt has 2 more weeks of PT. Encouraged pt to try aquatics at 1x/week, but pt wanting to continue 2x/week (one land and one aquatic). Will further discuss in future sessions. Also encouraged pt to try vestibular exercises on less firm surface at home to see if he is more successful, pt verbalized understanding.  Person educated: Patient Education method: Explanation, Demonstration, and Verbal cues Education comprehension: verbalized understanding and returned demonstration  HOME EXERCISE PROGRAM: Standing PWR moves  Access Code: [email or web address] URL: [email or web address] Date: 12/08/2021 Prepared by: Index with Counter Support  - 1 x daily - 5 x weekly - 1-2 sets - 10 reps - Standing Single Leg Stance with Counter Support  - 1 x daily - 5 x weekly - 3 sets - 10-15 hold - Standing Balance with Eyes Closed on Foam  - 1 x daily - 5 x weekly - 3 sets - 30 hold - Romberg Stance on Foam Pad with Head  Rotation and Head Nods  - 1 x daily - 5 x weekly - 2 sets - 10 reps  Verbally added balance with EC on foam for 30 seconds and EC with feet apart x10 reps head turns, x10 reps head nods    GOALS: Goals reviewed with patient? Yes  SHORT TERM GOALS: Target date: 12/26/2021  Pt will be independent with initial HEP for PD specific deficits in order to build upon functional gains made in therapy. Baseline: pt reports performing his HEP  Goal status: MET  2.   Pt will verbalize understanding of fall prevention in the home environment.  Baseline:  Goal status: MET  3.  Pt will finish assessment of miniBEST with LTG written. Baseline: 20/28 Goal status: MET  4.  Pt will improve gait speed with LRAD to at least 2.7 ft/sec in order to demo improved community mobility.   Baseline: 14.2 seconds with cane =2.31 ft/sec; 12.94 seconds = 2.53 ft/sec on 12/23/21 Goal status: NOT  MET    LONG TERM GOALS: Target date: 01/23/2022  Pt will be independent with final HEP for PD specific deficits in order to build upon functional gains made in therapy. Baseline:  Goal status: INITIAL  2.  Pt will improve miniBEST to at least a 23/28 in order to demo decr fall risk.  Baseline: 20/28 Goal status: INITIAL  3.  Pt will verbalize understanding of local Parkinson's disease resources, including options for continued community fitness. Baseline:  Goal status: INITIAL  4.  Pt will improve condition 4 of mCTSIB to at least 15 seconds in order to demo improved vestibular input for balance.  Baseline: 5.4 seconds Goal status: INITIAL  5.  Pt will perform at least 10 sit <> stand reps with proper technique and no episodes of retropulsion in order to demo improved transfer efficiency.  Baseline:  Goal status: INITIAL  6.   Pt will improve gait speed with LRAD to at least 3.0 ft/sec in order to demo improved community mobility.  Baseline: 14.2 seconds with cane =2.31 ft/sec Goal status: INITIAL  ASSESSMENT:  CLINICAL IMPRESSION: Emphasis of skilled PT session on ankle stability, BLE strength and side stepping. Pt reported increase in bilateral knee pain and weakness today and did not wear his L AFO due to inability to fit them in his shoes. Pt has significant difficulty performing calcaneal eversion during session, resulting in difficulty maintaining stability on unlevel surfaces. Pt was able to perform sit <>stands on dynadiscs w/little assistance by using good body mechanics. Continue POC.   OBJECTIVE IMPAIRMENTS: Abnormal gait, decreased activity tolerance, decreased balance, decreased coordination, decreased mobility, difficulty walking, decreased ROM, decreased strength, impaired flexibility, impaired tone, postural dysfunction, and pain.   ACTIVITY LIMITATIONS: bending, standing, stairs, transfers, bed mobility, and locomotion level  PARTICIPATION LIMITATIONS:  community activity and yard work  PERSONAL FACTORS: Age, Behavior pattern, Past/current experiences, Time since onset of injury/illness/exacerbation, and 3+ comorbidities: PD, Anxiety, HTN, HLD, multiple back surgeries/fusion neck and lumbar spine,  NSTEMI, 01/2020, hx of multiple syncopal episodes  are also affecting patient's functional outcome.   REHAB POTENTIAL: Good  CLINICAL DECISION MAKING: Evolving/moderate complexity  EVALUATION COMPLEXITY: Moderate  PLAN:  PT FREQUENCY: 2x/week  PT DURATION: 12 weeks  PLANNED INTERVENTIONS: Therapeutic exercises, Therapeutic activity, Neuromuscular re-education, Balance training, Gait training, Patient/Family education, Self Care, Stair training, Vestibular training, DME instructions, Aquatic Therapy, Manual therapy, and Re-evaluation  PLAN FOR NEXT SESSION: Add to HEP as needed.  SciFit. Work on balance, turning. SLS, unlevel  surfaces, reactive stepping. Assess for BPPV? Vestibular input. Discuss PD community resources and doing aquatic 1x/week    Cruzita Lederer Conception Doebler, PT, DPT  01/15/2022, 2:08 PM

## 2022-01-16 ENCOUNTER — Ambulatory Visit (INDEPENDENT_AMBULATORY_CARE_PROVIDER_SITE_OTHER): Payer: Medicare HMO | Admitting: Neurology

## 2022-01-16 DIAGNOSIS — G20A1 Parkinson's disease without dyskinesia, without mention of fluctuations: Secondary | ICD-10-CM

## 2022-01-16 NOTE — Procedures (Signed)
Punch Biopsy Procedure Note  Preprocedure Diagnosis: Bradykinesia; Tremor;   Postprocedure Diagnosis: same  Locations: Site 1: posterior, right shoulder;  Site 2: above, right knee;  Site 3: above, right foot  Indications: r/o alpha synucleinopathy  Anesthesia: 4 mL Lidocaine 1% without epinephrine without added sodium bicarbonate  Procedure Details Patient informed of the risks (including but not limited to bleeding, pain, infection, scar and infection) and benefits of the procedure.  Informed consent obtained.  The areas which were chosen for biopsy, as above, and surrounding areas were given a sterile prep using alcohol and betadyne and draped in the usual sterile fashion. The skin was then stretched perpendicular to the skin tension lines and sample removed using the 3 mm punch. Pressure applied, hemostasis achieved.   Dressing applied. The specimen(s) was sent for pathologic examination. The patient tolerated the procedure well.  Estimated Blood Loss: 0 ml  Condition: Stable  Complications: none.  Plan: 1. Instructed to keep the wound dry and covered for 24-48h and clean thereafter. 2. Warning signs of infection were reviewed.

## 2022-01-19 ENCOUNTER — Encounter: Payer: Self-pay | Admitting: Physical Therapy

## 2022-01-19 ENCOUNTER — Ambulatory Visit: Payer: Medicare HMO | Admitting: Physical Therapy

## 2022-01-19 DIAGNOSIS — R2681 Unsteadiness on feet: Secondary | ICD-10-CM

## 2022-01-19 DIAGNOSIS — M6281 Muscle weakness (generalized): Secondary | ICD-10-CM

## 2022-01-19 DIAGNOSIS — R2689 Other abnormalities of gait and mobility: Secondary | ICD-10-CM

## 2022-01-19 DIAGNOSIS — R29818 Other symptoms and signs involving the nervous system: Secondary | ICD-10-CM

## 2022-01-19 NOTE — Therapy (Signed)
OUTPATIENT PHYSICAL THERAPY NEURO TREATMENT   Patient Name: Joshua [name] MRN: [id number] DOB:03/12/1945, 77 y.o., male Today's Date: 01/19/2022   PCP: Ginger Organ., MD   REFERRING PROVIDER:  Carles Collet Eustace Quail, DO     END OF SESSION:  PT End of Session - 01/19/22 1320     Visit Number 13    Number of Visits 17    Date for PT Re-Evaluation 02/26/22   due to delay in scheduling   Authorization Type [employer]    PT Start Time 1318    PT Stop Time 1359    PT Time Calculation (min) 41 min    Equipment Utilized During Treatment Gait belt    Activity Tolerance Patient tolerated treatment well    Behavior During Therapy [facility] for tasks assessed/performed               Past Medical History:  Diagnosis Date   Allergic rhinitis    Allergy    Anal fissure    Anxiety    from chronic pain from surgery- on Cymbalta   Arthritis    Asthma    Barrett's esophagus 03/29/2014   Carotid artery disease ([place])    Carotid Doppler normal August, 2007   Cataract    Chest pain    Coronaries normal 1996 /  nuclear..06/2002..normal...EF  56% /  stress echo.. May, 2011.... no  scar or ischemia... rate related RBBB   Diverticulosis    Dyslipidemia    Ejection fraction    EF 60%, stress echo, May, 2011   GERD (gastroesophageal reflux disease)    Headache    History of loop recorder    has since 04/04/15   HTN (hypertension)    takes Metoprolol for PVC control   Hx of colonic polyps    adenomatous   Hx of colonoscopy    Hyperlipidemia    IFG (impaired fasting glucose)    Palpitations    Benign PVCs   Parkinson disease    Prostate cancer (HCC)    RBBB (right bundle branch block)    rate related   Shingles    Stroke [facility]    TIA   TIA (transient ischemic attack) 02/2015   Per pt, had 2 strokes   Tremor    Hand tremor   Vertigo    Past Surgical History:  Procedure Laterality Date   BACK SURGERY  2002,2009   x 6   CHOLECYSTECTOMY  11/30/2012   with IOC    COLONOSCOPY     ELECTROPHYSIOLOGIC STUDY N/A 09/26/2015   Procedure: V Tach Ablation (PVC);  Surgeon: Will Meredith Leeds, MD;  Location: [place] [facility];  Service: Cardiovascular;  Laterality: N/A;   EP IMPLANTABLE DEVICE N/A 04/04/2015   Procedure: Loop Recorder Insertion;  Surgeon: Will Meredith Leeds, MD;  Location: [place] [facility];  Service: Cardiovascular;  Laterality: N/A;   HERNIA REPAIR     laprascopic   KNEE SURGERY     DECEMBER 2017,LEFT KNEE SCOPED   LEFT HEART CATH AND CORONARY ANGIOGRAPHY N/A 02/05/2021   Procedure: LEFT HEART CATH AND CORONARY ANGIOGRAPHY;  Surgeon: Wellington Hampshire, MD;  Location: [place] [facility];  Service: Cardiovascular;  Laterality: N/A;   NECK SURGERY  2002   POLYPECTOMY     ROTATOR CUFF REPAIR Left    TEE WITHOUT CARDIOVERSION N/A 04/04/2015   Procedure: TRANSESOPHAGEAL ECHOCARDIOGRAM (TEE);  Surgeon: Lelon Perla, MD;  Location: [place];  Service: Cardiovascular;  Laterality: N/A;  TRIGGER FINGER RELEASE Left 12/28/2019   Procedure: RELEASE TRIGGER FINGER/A-1 PULLEY THUMB, MIDDLE AND RING;  Surgeon: Daryll Brod, MD;  Location: [place];  Service: Orthopedics;  Laterality: Left;  FAB   UPPER GASTROINTESTINAL ENDOSCOPY     V Tach ablation  09/26/2015   Patient Active Problem List   Diagnosis Date Noted   Coronary artery disease involving native coronary artery of native heart without angina pectoris 05/28/2021   History of myocardial infarction 02/05/2021   Parkinson's disease    Malignant neoplasm of prostate ([place]) 11/01/2017   Essential tremor 12/06/2015   PVC (premature ventricular contraction) 73/53/2992   Embolic stroke involving left middle cerebral artery ([place]) 04/07/2015   Acute CVA (cerebrovascular accident) ([place]) 03/06/2015   Stroke (cerebrum) ([place]) 03/05/2015   CVA (cerebral infarction) 03/05/2015   Weakness of right upper extremity    Tremor    Ejection fraction    Vertigo    Sleep apnea     Palpitations    Hyperlipidemia with target LDL less than 70    GERD (gastroesophageal reflux disease)    HTN (hypertension)    Chest pain    RBBB (right bundle branch block)    Ejection fraction    Carotid artery disease ([place])     ONSET DATE: 11/18/2021  REFERRING DIAG: G20.A1 (ICD-10-CM) - Parkinson's disease without dyskinesia or fluctuating manifestations   THERAPY DIAG:  Unsteadiness on feet  Other abnormalities of gait and mobility  Other symptoms and signs involving the nervous system  Muscle weakness (generalized)  Rationale for Evaluation and Treatment: Rehabilitation  SUBJECTIVE:                                                                                                                                                                                             SUBJECTIVE STATEMENT: Got his biopsy done on Friday. It went well. Will get the results in 20-30 days. No falls. Wants to keep working on the balance. Reports after Saturday, his knees was throbbing all day.     Pt accompanied by: self  PERTINENT HISTORY: PMH: PD, Anxiety, HTN, HLD, multiple back surgeries/fusion neck and lumbar spine,  NSTEMI, 01/2020, hx of multiple syncopal episodes  PAIN:  Bilateral knees are hurting at a 6-7/10 pain.  Reports have been having more issues with his R knee.   VITALS There were no vitals filed for this visit.    PRECAUTIONS: Fall  WEIGHT BEARING RESTRICTIONS: No  FALLS: Has patient fallen in last 6 months? No  LIVING ENVIRONMENT: Lives with: lives with their spouse Lives in: House/apartment - [place]: No Has following  equipment at home: Single point cane, Walker - 4 wheeled, Grab bars, and transport chair Has a lift chair and a lift bed.   PLOF: Independent Reports things like using a screwdriver or trying to get a nail in takes him a lot longer now. Wife has to help him a lot with these tasks.   PATIENT GOALS: Wants to work on his balance.    OBJECTIVE:   TODAY'S TREATMENT:         Therapeutic Activity Discussed POC going forwards, will plan to end with land therapy next week and pt will have a couple visits of aquatic therapy before D/C with pt in agreement with plan.  Also discussed return PD screens vs. Evals in 6-8 months and benefits of each, feel like pt would benefit from screens. Pt starts with OT next week   Goal Assessment:  10 reps sit <> stands from standard chair with no UE support, pt with no episodes of bracing against chair with good forward lean.   Gait speed: With no AD: 10.12 seconds = 3.24 ft/sec  With cane: 11.12 seconds =2.94 ft.sec    NMR  On rockerboard in A/P direciton: EC 5 x 30 seconds working on hip/ankle strategy to keep balance, pt needing to use UE support as needed in // bars as pt with posterior LOB, with incr reps pt demonstrating improved ability to use ankle strategy to recover and keep balance esp when pt was going posteriorly.  With 2nd PT, one providing min guard and 2nd tossing scarf: Alternating scarf toss at the same time for manual dual tasking, coordination, and reactive balance. Cued for opening up hands big when tossing and trying to have larger amplitude movement patterns when performing. Adding in cog challenge with pt counting backwards by 3 starting at 60, pt did well with cog dual task when counting backwards.   On blue mat (double folded over), 3/4 tandem foot position bilat,  and scarf toss with 2nd PT, approx. 15 tosses each side. Pt did very well with balance with this.   On black side of BOSU: With one stance leg on BOSU; alternating step up into a march x10 reps each side, beginning with UE support > none with min guard for balance. Pt reporting no knee pain when performing this.  2 sets of 10 reps head turns, pt performing head motions slowly, intermittent UE support to bars for balance.   On single blue mat: forwards walking with EC down and back x3 reps, pt taking  smaller steps initially with cued for longer step length with RLE    GAIT: Gait pattern: step through pattern, decreased arm swing- Right, decreased stride length, decreased trunk rotation, and trunk flexed Distance walked: Clinic distances  Assistive device utilized: None Level of assistance: SBA Comments: Min cues for open hands throughout, posture, and arm swing esp with RUE.                      PATIENT EDUCATION: Education details: See therapeutic activity section above. Reviewed pt to try vestibular exercises on less firm surface at home or wider BOS to see if he is more successful, pt verbalized understanding.  Person educated: Patient Education method: Explanation, Demonstration, and Verbal cues Education comprehension: verbalized understanding and returned demonstration  HOME EXERCISE PROGRAM: Standing PWR moves  Access Code: [email or web address] URL: [email or web address] Date: 12/08/2021 Prepared by: Janann August  Exercises - Heel Toe Raises with Counter Support  - 1 x daily - 5  x weekly - 1-2 sets - 10 reps - Standing Single Leg Stance with Counter Support  - 1 x daily - 5 x weekly - 3 sets - 10-15 hold - Standing Balance with Eyes Closed on Foam  - 1 x daily - 5 x weekly - 3 sets - 30 hold - Romberg Stance on Foam Pad with Head Rotation and Head Nods  - 1 x daily - 5 x weekly - 2 sets - 10 reps  Verbally added balance with EC on foam for 30 seconds and EC with feet apart x10 reps head turns, x10 reps head nods    GOALS: Goals reviewed with patient? Yes  SHORT TERM GOALS: Target date: 12/26/2021  Pt will be independent with initial HEP for PD specific deficits in order to build upon functional gains made in therapy. Baseline: pt reports performing his HEP  Goal status: MET  2.   Pt will verbalize understanding of fall prevention in the home environment.  Baseline:  Goal status: MET  3.  Pt will finish assessment of miniBEST with LTG written. Baseline:  20/28 Goal status: MET  4.  Pt will improve gait speed with LRAD to at least 2.7 ft/sec in order to demo improved community mobility.   Baseline: 14.2 seconds with cane =2.31 ft/sec; 12.94 seconds = 2.53 ft/sec on 12/23/21 Goal status: NOT MET    LONG TERM GOALS: Target date: 01/23/2022  Pt will be independent with final HEP for PD specific deficits in order to build upon functional gains made in therapy. Baseline:  Goal status: INITIAL  2.  Pt will improve miniBEST to at least a 23/28 in order to demo decr fall risk.  Baseline: 20/28 Goal status: INITIAL  3.  Pt will verbalize understanding of local Parkinson's disease resources, including options for continued community fitness. Baseline: Pt reports that he has this at home, but has not had the chance to fully look over it   Goal status: MET  4.  Pt will improve condition 4 of mCTSIB to at least 15 seconds in order to demo improved vestibular input for balance.  Baseline: 5.4 seconds Goal status: INITIAL  5.  Pt will perform at least 10 sit <> stand reps with proper technique and no episodes of retropulsion in order to demo improved transfer efficiency.  Baseline: met on 01/19/22 Goal status: MET  6.   Pt will improve gait speed with LRAD to at least 3.0 ft/sec in order to demo improved community mobility.  Baseline: 14.2 seconds with cane =2.31 ft/sec  On 01/19/22: With no AD: 10.12 seconds = 3.24 ft/sec  With cane: 11.12 seconds =2.94 ft.sec  Goal status: MET  ASSESSMENT:  CLINICAL IMPRESSION: Today's skilled session focused on beginning to assess pt's LTGs. Pt has met 3 out of 6 LTGs. Pt able to perform 10 reps of sit<> stands without UE support and did not have any BLE bracing/retropulsion against chair. Pt improved gait speed with no AD to 3.24 ft/sec and with cane to 2.94 ft/sec, indicating improved community mobility. Remainder of session focused on high level balance tasks on compliant surfaces with coordination, dual  tasking, vestibular input. Pt tolerated well and had no reports of knee pain with activities. Discussed plan to end land therapy next week and for pt to begin with aquatic therapy for PT for a few visits. Pt in agreement with plan. Will continue per POC.  OBJECTIVE IMPAIRMENTS: Abnormal gait, decreased activity tolerance, decreased balance, decreased coordination, decreased mobility, difficulty walking,  decreased ROM, decreased strength, impaired flexibility, impaired tone, postural dysfunction, and pain.   ACTIVITY LIMITATIONS: bending, standing, stairs, transfers, bed mobility, and locomotion level  PARTICIPATION LIMITATIONS: community activity and yard work  PERSONAL FACTORS: Age, Behavior pattern, Past/current experiences, Time since onset of injury/illness/exacerbation, and 3+ comorbidities: PD, Anxiety, HTN, HLD, multiple back surgeries/fusion neck and lumbar spine,  NSTEMI, 01/2020, hx of multiple syncopal episodes  are also affecting patient's functional outcome.   REHAB POTENTIAL: Good  CLINICAL DECISION MAKING: Evolving/moderate complexity  EVALUATION COMPLEXITY: Moderate  PLAN:  PT FREQUENCY: 2x/week  PT DURATION: 12 weeks  PLANNED INTERVENTIONS: Therapeutic exercises, Therapeutic activity, Neuromuscular re-education, Balance training, Gait training, Patient/Family education, Self Care, Stair training, Vestibular training, DME instructions, Aquatic Therapy, Manual therapy, and Re-evaluation  PLAN FOR NEXT SESSION: check rest of LTGs. Finalize HEP. [employer]. Work on balance, turning. SLS, unlevel surfaces, reactive stepping.  Vestibular input. Discuss PD community resources and doing aquatic 1x/week    Arliss Journey, PT, DPT  01/19/2022, 2:06 PM

## 2022-01-21 ENCOUNTER — Encounter: Payer: Self-pay | Admitting: Physical Therapy

## 2022-01-21 ENCOUNTER — Ambulatory Visit: Payer: Medicare HMO | Admitting: Physical Therapy

## 2022-01-21 DIAGNOSIS — R29818 Other symptoms and signs involving the nervous system: Secondary | ICD-10-CM

## 2022-01-21 DIAGNOSIS — R2681 Unsteadiness on feet: Secondary | ICD-10-CM

## 2022-01-21 DIAGNOSIS — M6281 Muscle weakness (generalized): Secondary | ICD-10-CM

## 2022-01-21 DIAGNOSIS — R2689 Other abnormalities of gait and mobility: Secondary | ICD-10-CM

## 2022-01-21 NOTE — Therapy (Signed)
OUTPATIENT PHYSICAL THERAPY NEURO TREATMENT   Patient Name: Joshua [name] MRN: [id number] DOB:25-Jul-1945, 77 y.o., male Today's Date: 01/21/2022   PCP: Ginger Organ., MD   REFERRING PROVIDER:  Carles Collet Eustace Quail, DO     END OF SESSION:  PT End of Session - 01/21/22 1318     Visit Number 14    Number of Visits 17    Date for PT Re-Evaluation 02/26/22   due to delay in scheduling   Authorization Type [employer] Medicare    PT Start Time 1318    PT Stop Time 1350   full time not used due to D/C from land therapy   PT Time Calculation (min) 32 min    Equipment Utilized During Treatment Gait belt    Activity Tolerance Patient tolerated treatment well    Behavior During Therapy [facility] for tasks assessed/performed               Past Medical History:  Diagnosis Date   Allergic rhinitis    Allergy    Anal fissure    Anxiety    from chronic pain from surgery- on Cymbalta   Arthritis    Asthma    Barrett's esophagus 03/29/2014   Carotid artery disease ([place])    Carotid Doppler normal August, 2007   Cataract    Chest pain    Coronaries normal 1996 /  nuclear..06/2002..normal...EF  56% /  stress echo.. May, 2011.... no  scar or ischemia... rate related RBBB   Diverticulosis    Dyslipidemia    Ejection fraction    EF 60%, stress echo, May, 2011   GERD (gastroesophageal reflux disease)    Headache    History of loop recorder    has since 04/04/15   HTN (hypertension)    takes Metoprolol for PVC control   Hx of colonic polyps    adenomatous   Hx of colonoscopy    Hyperlipidemia    IFG (impaired fasting glucose)    Palpitations    Benign PVCs   Parkinson disease    Prostate cancer (HCC)    RBBB (right bundle branch block)    rate related   Shingles    Stroke [facility])    TIA   TIA (transient ischemic attack) 02/2015   Per pt, had 2 strokes   Tremor    Hand tremor   Vertigo    Past Surgical History:  Procedure Laterality Date   BACK SURGERY  2002,2009    x 6   CHOLECYSTECTOMY  11/30/2012   with IOC   COLONOSCOPY     ELECTROPHYSIOLOGIC STUDY N/A 09/26/2015   Procedure: V Tach Ablation (PVC);  Surgeon: Will Meredith Leeds, MD;  Location: [place] [facility];  Service: Cardiovascular;  Laterality: N/A;   EP IMPLANTABLE DEVICE N/A 04/04/2015   Procedure: Loop Recorder Insertion;  Surgeon: Will Meredith Leeds, MD;  Location: [place] [facility];  Service: Cardiovascular;  Laterality: N/A;   HERNIA REPAIR     laprascopic   KNEE SURGERY     DECEMBER 2017,LEFT KNEE SCOPED   LEFT HEART CATH AND CORONARY ANGIOGRAPHY N/A 02/05/2021   Procedure: LEFT HEART CATH AND CORONARY ANGIOGRAPHY;  Surgeon: Wellington Hampshire, MD;  Location: [place] [facility];  Service: Cardiovascular;  Laterality: N/A;   NECK SURGERY  2002   POLYPECTOMY     ROTATOR CUFF REPAIR Left    TEE WITHOUT CARDIOVERSION N/A 04/04/2015   Procedure: TRANSESOPHAGEAL ECHOCARDIOGRAM (TEE);  Surgeon: Lelon Perla, MD;  Location: [place];  Service: Cardiovascular;  Laterality: N/A;   TRIGGER FINGER RELEASE Left 12/28/2019   Procedure: RELEASE TRIGGER FINGER/A-1 PULLEY THUMB, MIDDLE AND RING;  Surgeon: Daryll Brod, MD;  Location: [place];  Service: Orthopedics;  Laterality: Left;  FAB   UPPER GASTROINTESTINAL ENDOSCOPY     V Tach ablation  09/26/2015   Patient Active Problem List   Diagnosis Date Noted   Coronary artery disease involving native coronary artery of native heart without angina pectoris 05/28/2021   History of myocardial infarction 02/05/2021   Parkinson's disease    Malignant neoplasm of prostate ([place]) 11/01/2017   Essential tremor 12/06/2015   PVC (premature ventricular contraction) 33/35/4562   Embolic stroke involving left middle cerebral artery ([place]) 04/07/2015   Acute CVA (cerebrovascular accident) ([place]) 03/06/2015   Stroke (cerebrum) ([place]) 03/05/2015   CVA (cerebral infarction) 03/05/2015   Weakness of right upper extremity    Tremor     Ejection fraction    Vertigo    Sleep apnea    Palpitations    Hyperlipidemia with target LDL less than 70    GERD (gastroesophageal reflux disease)    HTN (hypertension)    Chest pain    RBBB (right bundle branch block)    Ejection fraction    Carotid artery disease ([place])     ONSET DATE: 11/18/2021  REFERRING DIAG: G20.A1 (ICD-10-CM) - Parkinson's disease without dyskinesia or fluctuating manifestations   THERAPY DIAG:  Unsteadiness on feet  Other abnormalities of gait and mobility  Other symptoms and signs involving the nervous system  Muscle weakness (generalized)  Rationale for Evaluation and Treatment: Rehabilitation  SUBJECTIVE:                                                                                                                                                                                             SUBJECTIVE STATEMENT: Reports knee was bothersome after last time. Had to take something for it.   Pt accompanied by: self  PERTINENT HISTORY: PMH: PD, Anxiety, HTN, HLD, multiple back surgeries/fusion neck and lumbar spine,  NSTEMI, 01/2020, hx of multiple syncopal episodes  PAIN:  Bilateral knees are hurting at a 6-7/10 pain.    VITALS There were no vitals filed for this visit.    PRECAUTIONS: Fall  WEIGHT BEARING RESTRICTIONS: No  FALLS: Has patient fallen in last 6 months? No  LIVING ENVIRONMENT: Lives with: lives with their spouse Lives in: House/apartment - [place]: No Has following equipment at home: Single point cane, Environmental consultant - 4 wheeled, Grab bars, and transport chair Has a lift chair and a lift  bed.   PLOF: Independent Reports things like using a screwdriver or trying to get a nail in takes him a lot longer now. Wife has to help him a lot with these tasks.   PATIENT GOALS: Wants to work on his balance.   OBJECTIVE:   TODAY'S TREATMENT:         Therapeutic Activity  [facility] PT Assessment - 01/21/22 1324        Mini-BESTest   Sit To Stand Normal: Comes to stand without use of hands and stabilizes independently.    Rise to Toes Moderate: Heels up, but not full range (smaller than when holding hands), OR noticeable instability for 3 s.    Stand on one leg (left) Moderate: < 20 s   1-2 seconds   Stand on one leg (right) Moderate: < 20 s   12 seconds, 6 seconds   Stand on one leg - lowest score 1    Compensatory Stepping Correction - Forward Normal: Recovers independently with a single, large step (second realignement is allowed).    Compensatory Stepping Correction - Backward Normal: Recovers independently with a single, large step    Compensatory Stepping Correction - Left Lateral Normal: Recovers independently with 1 step (crossover or lateral OK)    Compensatory Stepping Correction - Right Lateral Normal: Recovers independently with 1 step (crossover or lateral OK)    Stepping Corredtion Lateral - lowest score 2    Stance - Feet together, eyes open, firm surface  Normal: 30s    Stance - Feet together, eyes closed, foam surface  Normal: 30s    Incline - Eyes Closed Normal: Stands independently 30s and aligns with gravity    Change in Gait Speed Normal: Significantly changes walkling speed without imbalance    Walk with head turns - Horizontal Normal: performs head turns with no change in gait speed and good balance    Walk with pivot turns Moderate:Turns with feet close SLOW (>4 steps) with good balance.    Step over obstacles Normal: Able to step over box with minimal change of gait speed and with good balance.    Timed UP & GO with Dual Task Normal: No noticeable change in sitting, standing or walking while backward counting when compared to TUG without    Mini-BEST total score 25      Timed Up and Go Test   Normal TUG (seconds) 9.7    Cognitive TUG (seconds) 10            Verbally reviewed HEP with pt for land therapy. Pt with no questions at this time and performs consistently during the  week.    GAIT: Gait pattern: step through pattern, decreased arm swing- Right, decreased stride length, decreased trunk rotation, and trunk flexed Distance walked: Clinic distances  Assistive device utilized: None Level of assistance: SBA Comments: Min cues for open hands throughout, posture, and arm swing esp with RUE.                      PATIENT EDUCATION: Education details: Results of LTGs, D/C from land therapy and beginning aquatic therapy next week for a few visits to establish an HEP, schedule for PD screens in 6-8 months, OT eval starting next week, importance of continuing exercise to maintain gains (with pt verbalizing he is going to continue working on exercises).  Person educated: Patient Education method: Customer service manager Education comprehension: verbalized understanding  HOME EXERCISE PROGRAM: Standing PWR moves Multi-directional stepping sheet  Pt also has a stretching program he has at home.   Access Code: [email or web address] URL: [email or web address] Date: 12/08/2021 Prepared by: [place] with Counter Support  - 1 x daily - 5 x weekly - 1-2 sets - 10 reps - Standing Single Leg Stance with Counter Support  - 1 x daily - 5 x weekly - 3 sets - 10-15 hold - Standing Balance with Eyes Closed on Foam  - 1 x daily - 5 x weekly - 3 sets - 30 hold - Romberg Stance on Foam Pad with Head Rotation and Head Nods  - 1 x daily - 5 x weekly - 2 sets - 10 reps  EC with feet apart x10 reps head turns, x10 reps head nods    GOALS: Goals reviewed with patient? Yes  SHORT TERM GOALS: Target date: 12/26/2021  Pt will be independent with initial HEP for PD specific deficits in order to build upon functional gains made in therapy. Baseline: pt reports performing his HEP  Goal status: MET  2.   Pt will verbalize understanding of fall prevention in the home environment.  Baseline:  Goal status: MET  3.  Pt will finish  assessment of miniBEST with LTG written. Baseline: 20/28 Goal status: MET  4.  Pt will improve gait speed with LRAD to at least 2.7 ft/sec in order to demo improved community mobility.   Baseline: 14.2 seconds with cane =2.31 ft/sec; 12.94 seconds = 2.53 ft/sec on 12/23/21 Goal status: NOT MET    LONG TERM GOALS: Target date: 01/23/2022    UPDATED GOAL DATE FOR 12 WEEK POC: 02/26/22  Pt will be independent with final HEP (for land and aquatics) for PD specific deficits in order to build upon functional gains made in therapy. Baseline: met for land therapy , on-going for aquatics - pt begins aquatic therapy next week  Goal status: ON-GOING  2.  Pt will improve miniBEST to at least a 23/28 in order to demo decr fall risk.  Baseline: 20/28; 25/28 on 01/21/22 Goal status: MET  3.  Pt will verbalize understanding of local Parkinson's disease resources, including options for continued community fitness. Baseline: Pt reports that he has this at home, but has not had the chance to fully look over it   Goal status: MET  4.  Pt will improve condition 4 of mCTSIB to at least 15 seconds in order to demo improved vestibular input for balance.  Baseline: 5.4 seconds; 30 seconds on 01/21/22 Goal status: MET  5.  Pt will perform at least 10 sit <> stand reps with proper technique and no episodes of retropulsion in order to demo improved transfer efficiency.  Baseline: met on 01/19/22 Goal status: MET  6.   Pt will improve gait speed with LRAD to at least 3.0 ft/sec in order to demo improved community mobility.  Baseline: 14.2 seconds with cane =2.31 ft/sec  On 01/19/22: With no AD: 10.12 seconds = 3.24 ft/sec  With cane: 11.12 seconds =2.94 ft.sec  Goal status: MET    ASSESSMENT:  CLINICAL IMPRESSION: Today's skilled session focused on assessing remainder of LTGs. Pt has met all LTGs. Pt improved miniBEST to 25/28 (previously 20/28), indicating decreased fall risk. Pt improved condition 4 of  mCTSIB to 30 seconds (previously was 5 seconds), indicating improved vestibular input for balance. Pt has made excellent progress with PT and will end land PT appts at this time. Pt to start aquatic PT next week to establish  an HEP (pt is a member at the [facility] where he has access to the pool). Will also schedule PD screens in 6-8 months. Pt in agreement with plan and will continue working on HEP going forwards.   OBJECTIVE IMPAIRMENTS: Abnormal gait, decreased activity tolerance, decreased balance, decreased coordination, decreased mobility, difficulty walking, decreased ROM, decreased strength, impaired flexibility, impaired tone, postural dysfunction, and pain.   ACTIVITY LIMITATIONS: bending, standing, stairs, transfers, bed mobility, and locomotion level  PARTICIPATION LIMITATIONS: community activity and yard work  PERSONAL FACTORS: Age, Behavior pattern, Past/current experiences, Time since onset of injury/illness/exacerbation, and 3+ comorbidities: PD, Anxiety, HTN, HLD, multiple back surgeries/fusion neck and lumbar spine,  NSTEMI, 01/2020, hx of multiple syncopal episodes  are also affecting patient's functional outcome.   REHAB POTENTIAL: Good  CLINICAL DECISION MAKING: Evolving/moderate complexity  EVALUATION COMPLEXITY: Moderate  PLAN:  PT FREQUENCY: 2x/week  PT DURATION: 12 weeks  PLANNED INTERVENTIONS: Therapeutic exercises, Therapeutic activity, Neuromuscular re-education, Balance training, Gait training, Patient/Family education, Self Care, Stair training, Vestibular training, DME instructions, Aquatic Therapy, Manual therapy, and Re-evaluation  PLAN FOR NEXT SESSION: begin aquatic for 1x week for a few weeks and establish HEP.    Arliss Journey, PT, DPT  01/21/2022, 1:59 PM

## 2022-01-26 ENCOUNTER — Ambulatory Visit: Payer: Medicare HMO | Admitting: Physical Therapy

## 2022-01-26 ENCOUNTER — Ambulatory Visit: Payer: Medicare HMO | Admitting: Occupational Therapy

## 2022-01-28 ENCOUNTER — Ambulatory Visit: Payer: Medicare HMO | Admitting: Physical Therapy

## 2022-02-02 ENCOUNTER — Ambulatory Visit: Payer: Medicare HMO | Admitting: Occupational Therapy

## 2022-02-02 DIAGNOSIS — G20B1 Parkinson's disease with dyskinesia, without mention of fluctuations: Secondary | ICD-10-CM

## 2022-02-02 DIAGNOSIS — R29818 Other symptoms and signs involving the nervous system: Secondary | ICD-10-CM

## 2022-02-02 DIAGNOSIS — R29898 Other symptoms and signs involving the musculoskeletal system: Secondary | ICD-10-CM

## 2022-02-02 DIAGNOSIS — R278 Other lack of coordination: Secondary | ICD-10-CM

## 2022-02-02 DIAGNOSIS — G250 Essential tremor: Secondary | ICD-10-CM

## 2022-02-02 DIAGNOSIS — R2681 Unsteadiness on feet: Secondary | ICD-10-CM | POA: Diagnosis not present

## 2022-02-02 DIAGNOSIS — M6281 Muscle weakness (generalized): Secondary | ICD-10-CM

## 2022-02-02 NOTE — Therapy (Signed)
OUTPATIENT OCCUPATIONAL THERAPY PARKINSON'S EVALUATION  Patient Name: Joshua [name] MRN: [id number] DOB:1945/05/29, 77 y.o., male Today's Date: 02/09/2022  PCP: [name], MD  REFERRING PROVIDER: Arbutus Leas Octaviano Batty, DO  END OF SESSION:    OT End of Session - 02/02/22 1104    Visit Number/number of visits 1 / 25   Authorization Type A[employer]na Medicare   OT Start Time/End Time/OT Time 1105 /1145/40 minutes   Activity Tolerance Patient tolerated treatment well    Behavior During Therapy [facility] for tasks assessed/performed    Date for re-evaluation                                              05/01/2022      Past Medical History:  Diagnosis Date   Allergic rhinitis    Allergy    Anal fissure    Anxiety    from chronic pain from surgery- on Cymbalta   Arthritis    Asthma    Barrett's esophagus 03/29/2014   Carotid artery disease (HCC)    Carotid Doppler normal August, 2007   Cataract    Chest pain    Coronaries normal 1996 /  nuclear..06/2002..normal...EF  56% /  stress echo.. May, 2011.... no  scar or ischemia... rate related RBBB   Diverticulosis    Dyslipidemia    Ejection fraction    EF 60%, stress echo, May, 2011   GERD (gastroesophageal reflux disease)    Headache    History of loop recorder    has since 04/04/15   HTN (hypertension)    takes Metoprolol for PVC control   Hx of colonic polyps    adenomatous   Hx of colonoscopy    Hyperlipidemia    IFG (impaired fasting glucose)    Palpitations    Benign PVCs   Parkinson disease    Prostate cancer (HCC)    RBBB (right bundle branch block)    rate related   Shingles    Stroke [facility])    TIA   TIA (transient ischemic attack) 02/2015   Per pt, had 2 strokes   Tremor    Hand tremor   Vertigo    Past Surgical History:  Procedure Laterality Date   BACK SURGERY  2002,2009   x 6   CHOLECYSTECTOMY  11/30/2012   with IOC   COLONOSCOPY     ELECTROPHYSIOLOGIC STUDY N/A 09/26/2015   Procedure: V Tach  Ablation (PVC);  Surgeon: Will Jorja Loa, MD;  [facility];  Service: Cardiovascular;  Laterality: N/A;   EP IMPLANTABLE DEVICE N/A 04/04/2015   Procedure: Loop Recorder Insertion;  Surgeon: Will Jorja Loa, MD;  Location: [facility];  Service: Cardiovascular;  Laterality: N/A;   HERNIA REPAIR     laprascopic   KNEE SURGERY     DECEMBER 2017,LEFT KNEE SCOPED   LEFT HEART CATH AND CORONARY ANGIOGRAPHY N/A 02/05/2021   Procedure: LEFT HEART CATH AND CORONARY ANGIOGRAPHY;  Surgeon: Iran Ouch, MD;  Location: [facility];  Service: Cardiovascular;  Laterality: N/A;   NECK SURGERY  2002   POLYPECTOMY     ROTATOR CUFF REPAIR Left    TEE WITHOUT CARDIOVERSION N/A 04/04/2015   Procedure: TRANSESOPHAGEAL ECHOCARDIOGRAM (TEE);  Surgeon: Lewayne Bunting, MD;  Location: [facility];  Service: Cardiovascular;  Laterality: N/A;   TRIGGER FINGER RELEASE  Left 12/28/2019   Procedure: RELEASE TRIGGER FINGER/A-1 PULLEY THUMB, MIDDLE AND RING;  Surgeon: Cindee Salt, MD;  Location: [facility];  Service: Orthopedics;  Laterality: Left;  FAB   UPPER GASTROINTESTINAL ENDOSCOPY     V Tach ablation  09/26/2015   Patient Active Problem List   Diagnosis Date Noted   Coronary artery disease involving native coronary artery of native heart without angina pectoris 05/28/2021   History of myocardial infarction 02/05/2021   Parkinson's disease    Malignant neoplasm of prostate (HCC) 11/01/2017   Essential tremor 12/06/2015   PVC (premature ventricular contraction) 09/26/2015   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) 04/07/2015   Acute CVA (cerebrovascular accident) ([facility]) 03/06/2015   Stroke (cerebrum) (HCC) 03/05/2015   CVA (cerebral infarction) 03/05/2015   Weakness of right upper extremity    Tremor    Ejection fraction    Vertigo    Sleep apnea    Palpitations    Hyperlipidemia with target LDL less than 70    GERD  (gastroesophageal reflux disease)    HTN (hypertension)    Chest pain    RBBB (right bundle branch block)    Ejection fraction    Carotid artery disease (HCC)     ONSET DATE: 11/18/2021   REFERRING DIAG: G20.A1 (ICD-10-CM) - Parkinson's disease, unspecified whether dyskinesia present, unspecified whether manifestations fluctuate R29.898 (ICD-10-CM) - Rigidity  THERAPY DIAG:  No diagnosis found.  Rationale for Evaluation and Treatment: Rehabilitation  SUBJECTIVE:   SUBJECTIVE STATEMENT: Difficulty tying shoes, buttoning shirts, using screwdriver. He has sock aid and button hook, which he does use. He mostly buttons his shirts almost all the way to the top and then pulls it over his head.   Pt accompanied by: self  PERTINENT HISTORY: PMH: PD, Anxiety, HTN, HLD, multiple back surgeries/fusion neck and lumbar spine,  NSTEMI, 01/2020, hx of multiple syncopal episodes   PRECAUTIONS: Fall  WEIGHT BEARING RESTRICTIONS: No  PAIN:  Are you having pain? Yes: NPRS scale: 7/10 Pain location: B knees Pain description: aggravated  Aggravating factors: cold weather Relieving factors: rest, stretching, heat  FALLS: Has patient fallen in last 6 months? Yes. Number of falls 1  LIVING ENVIRONMENT: Lives with: lives with their spouse Lives in: House/apartment - [place] Stairs: No Has following equipment at home: Single point cane, Environmental consultant - 4 wheeled, Grab bars, and transport chair Has a lift chair and a lift bed.   PLOF: Independent; Horticulturist, commercial  PATIENT GOALS: reduce affects of tremor in RUE  OBJECTIVE:   HAND DOMINANCE: Right  ADLs: Overall ADLs: mod I Eating: setup Equipment: Shower seat with back, Grab bars, Walk in shower, Reacher, Sock aid, Long handled shoe horn, and Long handled sponge  IADLs: Shopping: able to do light shopping with shopping cart Light housekeeping: cleans bathrooms minus the tubs and uses light weight vacuum cleaners Meal Prep: Mod  I Community mobility: driving Medication management: mod I Financial management: 50/50 with wife Handwriting: 25% legible  MOBILITY STATUS: Needs Assist: uses SPC and Hx of falls  ACTIVITY TOLERANCE: Activity tolerance: fair  FUNCTIONAL OUTCOME MEASURES: To be assessed during future visits  UPPER EXTREMITY ROM:     AROM Right (eval) Left (eval)  Shoulder flexion WNL WNL  Shoulder abduction WNL WNL  Elbow flexion WNL WNL  Elbow extension WNL WNL  Wrist flexion 24 WNL  Wrist extension 15 WNL  Wrist pronation WFL WNL  Wrist supination [facility] WNL   Digit Composite Flexion  WNL WNL  Digit Composite Extension WNL WNL  Digit Opposition WNL WNL  (Blank rows = not tested)  UPPER EXTREMITY MMT:     MMT Right (eval) Left (eval)  Shoulder flexion [facility] WNL  Shoulder abduction WFL WNL  Elbow flexion WFL WNL  Elbow extension WFL WNL  (Blank rows = not tested)  HAND FUNCTION: Grip strength: Right: 31.3 lbs; Left: 83.7 lbs  COORDINATION: 9 Hole Peg test: Right: 34.91 sec; Left: 48.99 sec Box and Blocks:  Right 34 blocks, Left 41blocks Tremors: Right, Left, and right worse than left  SENSATION: Mild, occasional paresthesias in R hand and fingers  EDEMA: mild arthritic changes to R wrist  MUSCLE TONE: RUE: Rigidity; mild  COGNITION: Overall cognitive status: Within functional limits for tasks assessed  VISION: Subjective report: Just got new glasses RX; limited changes Baseline vision: Wears glasses all the time; progressive lenses Visual history: n/a  OBSERVATIONS: Bradykinesia   TODAY'S TREATMENT:                                                                                                                               N/A  PATIENT EDUCATION: Education details: Role of OT; POC Person educated: Patient Education method: Explanation Education comprehension: verbalized understanding  HOME EXERCISE PROGRAM: N/A  GOALS:  SHORT TERM GOALS: Target date:  03/03/2022    Pt will be independent with PD specific HEP.  Baseline: Goal status: INITIAL  2.  Pt will verbalize understanding of adapted strategies to maximize safety and I with ADLs/IADLs. Baseline:  Goal status: INITIAL  3.  Pt will verbalize understanding of ways to prevent future PD related complications and PD community resources.  Baseline:  Goal status: INITIAL  4.  Assess PPT #2 and set appropriate goal. Baseline:  Goal status: INITIAL  5.  Assess PPT #4 and set appropriate goal. Baseline:  Goal status: INITIAL  LONG TERM GOALS: Target date: 05/01/2022  Pt will write short paragraph with 100% legibility and no significant decrease in letter size. Baseline: 25% legible Goal status: INITIAL  2.  Patient will complete nine-hole peg with use of L in 40 seconds or less. Baseline: 48 seconds  Goal status: INITIAL  3.  Patient will demonstrate at least 41 lbs R grip strength as needed to open jars and other containers. Baseline: 31 lbs Goal status: INITIAL  ASSESSMENT:  CLINICAL IMPRESSION: Patient is a 77 y.o. male who was seen today for occupational therapy evaluation for Parkinson's. Hx includes multiple back surgeries (x6), hernia repair, L knee surgery, L rotator cuff repair, L trigger finger release, MI, HLD, HTN, CAD, CVA, and essential tremor. Patient currently presents below baseline level of functioning demonstrating functional deficits and impairments as noted below. Pt would benefit from skilled OT services in the outpatient setting to work on impairments as noted below to help pt return to PLOF as able.    PERFORMANCE DEFICITS: in functional skills including ADLs, IADLs, coordination, strength,  Fine motor control, Gross motor control, mobility, balance, and UE functional use.  IMPAIRMENTS: are limiting patient from ADLs, IADLs, and leisure.   COMORBIDITIES:  may have co-morbidities  that affects occupational performance. Patient will benefit from skilled OT  to address above impairments and improve overall function.  MODIFICATION OR ASSISTANCE TO COMPLETE EVALUATION: Min-Moderate modification of tasks or assist with assess necessary to complete an evaluation.  OT OCCUPATIONAL PROFILE AND HISTORY: Problem focused assessment: Including review of records relating to presenting problem.  CLINICAL DECISION MAKING: LOW - limited treatment options, no task modification necessary  REHAB POTENTIAL: Fair given diagnosis  EVALUATION COMPLEXITY: Low   PLAN:  OT FREQUENCY: 2x/week  OT DURATION: 12 weeks  PLANNED INTERVENTIONS: self care/ADL training, therapeutic exercise, therapeutic activity, neuromuscular re-education, manual therapy, passive range of motion, balance training, functional mobility training, splinting, electrical stimulation, ultrasound, paraffin, fluidotherapy, moist heat, patient/family education, cognitive remediation/compensation, visual/perceptual remediation/compensation, energy conservation, coping strategies training, DME and/or AE instructions, and Re-evaluation  RECOMMENDED OTHER SERVICES: none at this time  CONSULTED AND AGREED WITH PLAN OF CARE: Patient  PLAN FOR NEXT SESSION: HEP   Delana Meyer, OT 02/02/2022, 10:14 AM

## 2022-02-03 ENCOUNTER — Encounter: Payer: Self-pay | Admitting: Rehabilitation

## 2022-02-03 ENCOUNTER — Ambulatory Visit: Payer: Medicare HMO | Admitting: Rehabilitation

## 2022-02-03 DIAGNOSIS — R2681 Unsteadiness on feet: Secondary | ICD-10-CM | POA: Diagnosis not present

## 2022-02-03 DIAGNOSIS — R293 Abnormal posture: Secondary | ICD-10-CM

## 2022-02-03 DIAGNOSIS — R29818 Other symptoms and signs involving the nervous system: Secondary | ICD-10-CM

## 2022-02-03 DIAGNOSIS — R2689 Other abnormalities of gait and mobility: Secondary | ICD-10-CM

## 2022-02-03 DIAGNOSIS — M6281 Muscle weakness (generalized): Secondary | ICD-10-CM

## 2022-02-03 NOTE — Therapy (Signed)
OUTPATIENT PHYSICAL THERAPY NEURO TREATMENT   Patient Name: Joshua [name] MRN: [id number] DOB:January 20, 1945, 77 y.o., male Today's Date: 02/03/2022   PCP: Ginger Organ., MD   REFERRING PROVIDER:  Carles Collet Eustace Quail, DO     END OF SESSION:  PT End of Session - 02/03/22 1455     Visit Number 15    Number of Visits 17    Date for PT Re-Evaluation 02/26/22   due to delay in scheduling   Authorization Type [employer]    PT Start Time 1102    PT Stop Time 1145    PT Time Calculation (min) 43 min    Activity Tolerance Patient tolerated treatment well    Behavior During Therapy [facility] for tasks assessed/performed               Past Medical History:  Diagnosis Date   Allergic rhinitis    Allergy    Anal fissure    Anxiety    from chronic pain from surgery- on Cymbalta   Arthritis    Asthma    Barrett's esophagus 03/29/2014   Carotid artery disease ([place])    Carotid Doppler normal August, 2007   Cataract    Chest pain    Coronaries normal 1996 /  nuclear..06/2002..normal...EF  56% /  stress echo.. May, 2011.... no  scar or ischemia... rate related RBBB   Diverticulosis    Dyslipidemia    Ejection fraction    EF 60%, stress echo, May, 2011   GERD (gastroesophageal reflux disease)    Headache    History of loop recorder    has since 04/04/15   HTN (hypertension)    takes Metoprolol for PVC control   Hx of colonic polyps    adenomatous   Hx of colonoscopy    Hyperlipidemia    IFG (impaired fasting glucose)    Palpitations    Benign PVCs   Parkinson disease    Prostate cancer (HCC)    RBBB (right bundle branch block)    rate related   Shingles    Stroke [facility])    TIA   TIA (transient ischemic attack) 02/2015   Per pt, had 2 strokes   Tremor    Hand tremor   Vertigo    Past Surgical History:  Procedure Laterality Date   BACK SURGERY  2002,2009   x 6   CHOLECYSTECTOMY  11/30/2012   with IOC   COLONOSCOPY     ELECTROPHYSIOLOGIC STUDY N/A  09/26/2015   Procedure: V Tach Ablation (PVC);  Surgeon: Will Meredith Leeds, MD;  Location: [place] [facility];  Service: Cardiovascular;  Laterality: N/A;   EP IMPLANTABLE DEVICE N/A 04/04/2015   Procedure: Loop Recorder Insertion;  Surgeon: Will Meredith Leeds, MD;  Location: [place] [facility];  Service: Cardiovascular;  Laterality: N/A;   HERNIA REPAIR     laprascopic   KNEE SURGERY     DECEMBER 2017,LEFT KNEE SCOPED   LEFT HEART CATH AND CORONARY ANGIOGRAPHY N/A 02/05/2021   Procedure: LEFT HEART CATH AND CORONARY ANGIOGRAPHY;  Surgeon: Wellington Hampshire, MD;  Location: [place] [facility];  Service: Cardiovascular;  Laterality: N/A;   NECK SURGERY  2002   POLYPECTOMY     ROTATOR CUFF REPAIR Left    TEE WITHOUT CARDIOVERSION N/A 04/04/2015   Procedure: TRANSESOPHAGEAL ECHOCARDIOGRAM (TEE);  Surgeon: Lelon Perla, MD;  Location: [facility];  Service: Cardiovascular;  Laterality: N/A;   TRIGGER FINGER RELEASE Left 12/28/2019   Procedure:  RELEASE TRIGGER FINGER/A-1 PULLEY THUMB, MIDDLE AND RING;  Surgeon: Daryll Brod, MD;  Location: [place];  Service: Orthopedics;  Laterality: Left;  FAB   UPPER GASTROINTESTINAL ENDOSCOPY     V Tach ablation  09/26/2015   Patient Active Problem List   Diagnosis Date Noted   Coronary artery disease involving native coronary artery of native heart without angina pectoris 05/28/2021   History of myocardial infarction 02/05/2021   Parkinson's disease    Malignant neoplasm of prostate ([place]) 11/01/2017   Essential tremor 12/06/2015   PVC (premature ventricular contraction) 16/10/9602   Embolic stroke involving left middle cerebral artery ([place]) 04/07/2015   Acute CVA (cerebrovascular accident) ([place]) 03/06/2015   Stroke (cerebrum) ([place]) 03/05/2015   CVA (cerebral infarction) 03/05/2015   Weakness of right upper extremity    Tremor    Ejection fraction    Vertigo    Sleep apnea    Palpitations    Hyperlipidemia with target LDL less  than 70    GERD (gastroesophageal reflux disease)    HTN (hypertension)    Chest pain    RBBB (right bundle branch block)    Ejection fraction    Carotid artery disease ([place])     ONSET DATE: 11/18/2021  REFERRING DIAG: G20.A1 (ICD-10-CM) - Parkinson's disease without dyskinesia or fluctuating manifestations   THERAPY DIAG:  Unsteadiness on feet  Muscle weakness (generalized)  Other abnormalities of gait and mobility  Abnormal posture  Other symptoms and signs involving the nervous system  Rationale for Evaluation and Treatment: Rehabilitation  SUBJECTIVE:                                                                                                                                                                                             SUBJECTIVE STATEMENT: Reports knee was bothersome after last time. Had to take something for it.   Pt accompanied by: self  PERTINENT HISTORY: PMH: PD, Anxiety, HTN, HLD, multiple back surgeries/fusion neck and lumbar spine,  NSTEMI, 01/2020, hx of multiple syncopal episodes  PAIN:  Bilateral knees are hurting at a 6-7/10 pain.    VITALS There were no vitals filed for this visit.    PRECAUTIONS: Fall  WEIGHT BEARING RESTRICTIONS: No  FALLS: Has patient fallen in last 6 months? No  LIVING ENVIRONMENT: Lives with: lives with their spouse Lives in: House/apartment - [place]: No Has following equipment at home: Single point cane, Environmental consultant - 4 wheeled, Grab bars, and transport chair Has a lift chair and a lift bed.   PLOF: Independent Reports things like using a screwdriver or trying to get a  nail in takes him a lot longer now. Wife has to help him a lot with these tasks.   PATIENT GOALS: Wants to work on his balance.   OBJECTIVE:   TODAY'S TREATMENT:            Patient seen for aquatic therapy today.  Treatment took place in water 3.6-4.0 feet deep depending upon activity.  Pt entered and exited the pool via  stairs using B rails in alternating fashion.    In approx 4' dept, had pt ambulate approx 16' x 2 laps without support, backwards x 2 laps (same distance), side stepping with squat (moving arms into abd with lateral step/squat and then add when moving legs in add) x 2 laps again without support and cues for technique.  Marching forward with alt arm movements using yellow dumbells x 2 laps.  At end of session performed 2 more laps of marching with alt arm movements with dumbells with 3lb weights on each ankle for more strengthening.  Tolerated well.  Min/guard throughout.     NMR:  Standing near side of pool for UE support as needed; hip flex (with knee in ext) x 10 reps each side.  R then LLE circles moving clockwise x 10 reps then counter clockwise x 10 reps.  All performed with light to no UE support.  Pt does need more intermittent support when in R LE stance, likely due to weakness from knee issues.  Ai Chi postures utilized for balance and posture; Enclosing posture x 10 reps with intermittent assist needed due to balance (actually had him turn and place back close to wall due to continued LOB).  While back near wall, performed Soothing posture x 10 reps, balance much improved therefore moved off wall again to perform a modified rounding posture (did not have him lift leg, but perform only ant/post weight shift) x 10 reps.  Pt with more difficulty with LLE placed posteriorly but improved with reps.     Utilized small 4" step in pool performing forward step ups with opposite LE moving into a march x 10 reps on each side with single UE support.  Verbal and tactile cues for posture which also improved.    Strengthening:  Utilized 3lb ankle weights for marching as above, moving into hip flexion then all the way into extension (with knee in ext) with single UE support x 10 reps on each side.     Pt requires buoyancy of water for support for reduced fall risk and for unloading/reduced stress on joints (Rt  an Lt knee) as pt able to tolerate increased standing (loading)  and ambulation in water compared to that on land; viscosity of water is needed for resistance for strengthening and current of water provides perturbations for challenge for balance training                             PATIENT EDUCATION: Education details: Seeing for 1-2 more visits to establish HEP Person educated: Patient Education method: Customer service manager Education comprehension: verbalized understanding  HOME EXERCISE PROGRAM: Standing PWR moves Multi-directional stepping sheet  Pt also has a stretching program he has at home.   Access Code: [email or web address] URL: [email or web address] Date: 12/08/2021 Prepared by: Janann August  Exercises - Heel Toe Raises with Counter Support  - 1 x daily - 5 x weekly - 1-2 sets - 10 reps - Standing Single Leg Stance with Counter Support  - 1 x  daily - 5 x weekly - 3 sets - 10-15 hold - Standing Balance with Eyes Closed on Foam  - 1 x daily - 5 x weekly - 3 sets - 30 hold - Romberg Stance on Foam Pad with Head Rotation and Head Nods  - 1 x daily - 5 x weekly - 2 sets - 10 reps  EC with feet apart x10 reps head turns, x10 reps head nods    GOALS: Goals reviewed with patient? Yes  SHORT TERM GOALS: Target date: 12/26/2021  Pt will be independent with initial HEP for PD specific deficits in order to build upon functional gains made in therapy. Baseline: pt reports performing his HEP  Goal status: MET  2.   Pt will verbalize understanding of fall prevention in the home environment.  Baseline:  Goal status: MET  3.  Pt will finish assessment of miniBEST with LTG written. Baseline: 20/28 Goal status: MET  4.  Pt will improve gait speed with LRAD to at least 2.7 ft/sec in order to demo improved community mobility.   Baseline: 14.2 seconds with cane =2.31 ft/sec; 12.94 seconds = 2.53 ft/sec on 12/23/21 Goal status: NOT MET    LONG TERM GOALS:  Target date: 01/23/2022    UPDATED GOAL DATE FOR 12 WEEK POC: 02/26/22  Pt will be independent with final HEP (for land and aquatics) for PD specific deficits in order to build upon functional gains made in therapy. Baseline: met for land therapy , on-going for aquatics - pt begins aquatic therapy next week  Goal status: ON-GOING  2.  Pt will improve miniBEST to at least a 23/28 in order to demo decr fall risk.  Baseline: 20/28; 25/28 on 01/21/22 Goal status: MET  3.  Pt will verbalize understanding of local Parkinson's disease resources, including options for continued community fitness. Baseline: Pt reports that he has this at home, but has not had the chance to fully look over it   Goal status: MET  4.  Pt will improve condition 4 of mCTSIB to at least 15 seconds in order to demo improved vestibular input for balance.  Baseline: 5.4 seconds; 30 seconds on 01/21/22 Goal status: MET  5.  Pt will perform at least 10 sit <> stand reps with proper technique and no episodes of retropulsion in order to demo improved transfer efficiency.  Baseline: met on 01/19/22 Goal status: MET  6.   Pt will improve gait speed with LRAD to at least 3.0 ft/sec in order to demo improved community mobility.  Baseline: 14.2 seconds with cane =2.31 ft/sec  On 01/19/22: With no AD: 10.12 seconds = 3.24 ft/sec  With cane: 11.12 seconds =2.94 ft.sec  Goal status: MET    ASSESSMENT:  CLINICAL IMPRESSION: Pt seen today in pool to establish HEP as he would like to continue strengthening and balance tasks in pool as well as on land.  Utilized buoyancy for reducing fall risk and viscosity of water for resistance to strengthen without causing pain in knees.  Pt tolerated well.  Would like to see for 1-2 more visits to est more concrete HEP.     OBJECTIVE IMPAIRMENTS: Abnormal gait, decreased activity tolerance, decreased balance, decreased coordination, decreased mobility, difficulty walking, decreased ROM, decreased  strength, impaired flexibility, impaired tone, postural dysfunction, and pain.   ACTIVITY LIMITATIONS: bending, standing, stairs, transfers, bed mobility, and locomotion level  PARTICIPATION LIMITATIONS: community activity and yard work  PERSONAL FACTORS: Age, Behavior pattern, Past/current experiences, Time since onset of injury/illness/exacerbation,  and 3+ comorbidities: PD, Anxiety, HTN, HLD, multiple back surgeries/fusion neck and lumbar spine,  NSTEMI, 01/2020, hx of multiple syncopal episodes  are also affecting patient's functional outcome.   REHAB POTENTIAL: Good  CLINICAL DECISION MAKING: Evolving/moderate complexity  EVALUATION COMPLEXITY: Moderate  PLAN:  PT FREQUENCY: 2x/week  PT DURATION: 12 weeks  PLANNED INTERVENTIONS: Therapeutic exercises, Therapeutic activity, Neuromuscular re-education, Balance training, Gait training, Patient/Family education, Self Care, Stair training, Vestibular training, DME instructions, Aquatic Therapy, Manual therapy, and Re-evaluation  PLAN FOR NEXT SESSION: begin aquatic for 1x week for a few weeks and establish HEP.    Cameron Sprang, PT, MPT [facility] [address] [place], [place], [address] Phone: [phone]   Fax:  [phone] 02/03/22, 2:56 PM

## 2022-02-10 ENCOUNTER — Ambulatory Visit: Payer: Medicare HMO | Admitting: Occupational Therapy

## 2022-02-10 ENCOUNTER — Telehealth: Payer: Self-pay | Admitting: Neurology

## 2022-02-10 NOTE — Telephone Encounter (Signed)
Called patient and made him aware of test results. He had no questions at this time

## 2022-02-10 NOTE — Telephone Encounter (Signed)
Pts skin biopsy came back.  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  No evidence of small fiber neuropathy 3.  No evidence of amyloid deposition within the cutaneous nerves  Please call pt/family and let them know that there is evidence of alpha synuclein in his skin biopsy.  This is consistent with the dx of Parkinsons Disease.  No changes in his medications for now

## 2022-02-12 ENCOUNTER — Ambulatory Visit: Payer: Medicare HMO | Admitting: Occupational Therapy

## 2022-02-17 ENCOUNTER — Encounter: Payer: Medicare HMO | Admitting: Occupational Therapy

## 2022-02-19 ENCOUNTER — Other Ambulatory Visit: Payer: Self-pay | Admitting: Cardiology

## 2022-02-19 ENCOUNTER — Encounter: Payer: Medicare HMO | Admitting: Occupational Therapy

## 2022-02-24 ENCOUNTER — Ambulatory Visit: Payer: Medicare HMO | Attending: Neurology | Admitting: Occupational Therapy

## 2022-02-24 DIAGNOSIS — M6281 Muscle weakness (generalized): Secondary | ICD-10-CM

## 2022-02-24 DIAGNOSIS — R2681 Unsteadiness on feet: Secondary | ICD-10-CM

## 2022-02-24 DIAGNOSIS — R293 Abnormal posture: Secondary | ICD-10-CM

## 2022-02-24 DIAGNOSIS — R29818 Other symptoms and signs involving the nervous system: Secondary | ICD-10-CM

## 2022-02-24 DIAGNOSIS — R29898 Other symptoms and signs involving the musculoskeletal system: Secondary | ICD-10-CM

## 2022-02-24 DIAGNOSIS — R278 Other lack of coordination: Secondary | ICD-10-CM

## 2022-02-24 DIAGNOSIS — G20B1 Parkinson's disease with dyskinesia, without mention of fluctuations: Secondary | ICD-10-CM | POA: Insufficient documentation

## 2022-02-24 DIAGNOSIS — G250 Essential tremor: Secondary | ICD-10-CM | POA: Diagnosis present

## 2022-02-24 DIAGNOSIS — R2689 Other abnormalities of gait and mobility: Secondary | ICD-10-CM

## 2022-02-24 NOTE — Patient Instructions (Signed)
PWR! Hands  With arms stretched out in front of you (elbows straight), perform the following: PWR! Twist: Twist palms up and down BIG  Then, start with elbows bent and hands closed. PWR! Step: Touch index finger to thumb while keeping other fingers straight. Flick fingers out BIG (thumb out/straighten fingers). Repeat with other fingers. (Step your thumb to each finger). PWR! Hands: Push hands out BIG. Elbows straight, wrists up, fingers open and spread apart BIG. (Can also perform by pushing down on table, chair, knees. Push above head, out to the side, behind you, in front of you.)   ** Make each movement big and deliberate so that you feel the movement.  Perform at least 10 repetitions 1x/day, but perform PWR! hands throughout the day when you are having trouble using your hands (picking up/manipulating small objects, writing, eating, typing, sewing, buttoning, etc.).   Coordination Exercises  Perform the following exercises for 20 minutes 1 times per day. Perform with both hand(s). Perform using big movements.  Flipping Cards: Place deck of cards on the table. Flip cards over by opening your hand big to grasp and then turn your palm up big. Deal cards: Hold 1/2 or whole deck in your hand. Use thumb to push card off top of deck with one big push. Rotate ball with fingertips: Pick up with fingers/thumb and move as much as you can with each turn/movement (clockwise and counter-clockwise). Pick up coins and stack one at a time: Pick up with big, intentional movements. Do not drag coin to the edge. (5-10 in a stack) Pick up 5-10 coins one at a time and hold in palm. Then, move coins from palm to fingertips one at time and place in coin bank/container. Practice writing: Slow down, write big, and focus on forming each letter. Perform "Flicks"/hand stretches (PWR! Hands): Close hands then flick out your fingers with focus on opening hands, pulling wrists back, and extending elbows like you are  pushing.

## 2022-02-24 NOTE — Therapy (Signed)
OUTPATIENT OCCUPATIONAL THERAPY PARKINSON'S EVALUATION  Patient Name: Joshua [name] MRN: [id number] DOB:01-29-1945, 77 y.o., male Today's Date:   PCP: Ginger Organ., MD  REFERRING PROVIDER: Ludwig Clarks, DO      OT End of Session - 02/24/22 1223     Visit Number 2    Number of Visits 25    Date for OT Re-Evaluation 05/01/22    Authorization Type [employer]    Progress Note Due on Visit 10    OT Start Time 1020    OT Stop Time 1100    OT Time Calculation (min) 40 min    Activity Tolerance Patient tolerated treatment well    Behavior During Therapy [facility] for tasks assessed/performed                Past Medical History:  Diagnosis Date   Allergic rhinitis    Allergy    Anal fissure    Anxiety    from chronic pain from surgery- on Cymbalta   Arthritis    Asthma    Barrett's esophagus 03/29/2014   Carotid artery disease ([place])    Carotid Doppler normal August, 2007   Cataract    Chest pain    Coronaries normal 1996 /  nuclear..06/2002..normal...EF  56% /  stress echo.. May, 2011.... no  scar or ischemia... rate related RBBB   Diverticulosis    Dyslipidemia    Ejection fraction    EF 60%, stress echo, May, 2011   GERD (gastroesophageal reflux disease)    Headache    History of loop recorder    has since 04/04/15   HTN (hypertension)    takes Metoprolol for PVC control   Hx of colonic polyps    adenomatous   Hx of colonoscopy    Hyperlipidemia    IFG (impaired fasting glucose)    Palpitations    Benign PVCs   Parkinson disease    Prostate cancer (HCC)    RBBB (right bundle branch block)    rate related   Shingles    Stroke [facility])    TIA   TIA (transient ischemic attack) 02/2015   Per pt, had 2 strokes   Tremor    Hand tremor   Vertigo    Past Surgical History:  Procedure Laterality Date   BACK SURGERY  2002,2009   x 6   CHOLECYSTECTOMY  11/30/2012   with IOC   COLONOSCOPY     ELECTROPHYSIOLOGIC STUDY N/A 09/26/2015    Procedure: V Tach Ablation (PVC);  Surgeon: Will Meredith Leeds, MD;  Location: [place] CV LAB;  Service: Cardiovascular;  Laterality: N/A;   EP IMPLANTABLE DEVICE N/A 04/04/2015   Procedure: Loop Recorder Insertion;  Surgeon: Will Meredith Leeds, MD;  Location: [place] [facility];  Service: Cardiovascular;  Laterality: N/A;   HERNIA REPAIR     laprascopic   KNEE SURGERY     DECEMBER 2017,LEFT KNEE SCOPED   LEFT HEART CATH AND CORONARY ANGIOGRAPHY N/A 02/05/2021   Procedure: LEFT HEART CATH AND CORONARY ANGIOGRAPHY;  Surgeon: Wellington Hampshire, MD;  Location: [place] [facility];  Service: Cardiovascular;  Laterality: N/A;   NECK SURGERY  2002   POLYPECTOMY     ROTATOR CUFF REPAIR Left    TEE WITHOUT CARDIOVERSION N/A 04/04/2015   Procedure: TRANSESOPHAGEAL ECHOCARDIOGRAM (TEE);  Surgeon: Lelon Perla, MD;  Location: [facility] ENDOSCOPY;  Service: Cardiovascular;  Laterality: N/A;   TRIGGER FINGER RELEASE Left 12/28/2019   Procedure: RELEASE TRIGGER FINGER/A-1  PULLEY THUMB, MIDDLE AND RING;  Surgeon: Daryll Brod, MD;  Location: [place];  Service: Orthopedics;  Laterality: Left;  FAB   UPPER GASTROINTESTINAL ENDOSCOPY     V Tach ablation  09/26/2015   Patient Active Problem List   Diagnosis Date Noted   Coronary artery disease involving native coronary artery of native heart without angina pectoris 05/28/2021   History of myocardial infarction 02/05/2021   Parkinson's disease    Malignant neoplasm of prostate ([place]) 11/01/2017   Essential tremor 12/06/2015   PVC (premature ventricular contraction) [id number]   Embolic stroke involving left middle cerebral artery ([place]) 04/07/2015   Acute CVA (cerebrovascular accident) ([place]) 03/06/2015   Stroke (cerebrum) ([place]) 03/05/2015   CVA (cerebral infarction) 03/05/2015   Weakness of right upper extremity    Tremor    Ejection fraction    Vertigo    Sleep apnea    Palpitations    Hyperlipidemia with target LDL less than 70     GERD (gastroesophageal reflux disease)    HTN (hypertension)    Chest pain    RBBB (right bundle branch block)    Ejection fraction    Carotid artery disease ([place])     ONSET DATE: 11/18/2021   REFERRING DIAG: G20.A1 (ICD-10-CM) - Parkinson's disease, unspecified whether dyskinesia present, unspecified whether manifestations fluctuate R29.898 (ICD-10-CM) - Rigidity  THERAPY DIAG:  No diagnosis found.  Rationale for Evaluation and Treatment: Rehabilitation  SUBJECTIVE:   SUBJECTIVE STATEMENT: Difficulty tying shoes, buttoning shirts, using screwdriver. He has sock aid and button hook, which he does use. He mostly buttons his shirts almost all the way to the top and then pulls it over his head.   Pt accompanied by: self  PERTINENT HISTORY: PMH: PD, Anxiety, HTN, HLD, multiple back surgeries/fusion neck and lumbar spine,  NSTEMI, 01/2020, hx of multiple syncopal episodes   PRECAUTIONS: Fall  WEIGHT BEARING RESTRICTIONS: No  PAIN:  Are you having pain? Yes: NPRS scale: 7/10 Pain location: B knees Pain description: aggravated  Aggravating factors: cold weather Relieving factors: rest, stretching, heat  FALLS: Has patient fallen in last 6 months? Yes. Number of falls 1  LIVING ENVIRONMENT: Lives with: lives with their spouse Lives in: House/apartment - [place]: No Has following equipment at home: Single point cane, Environmental consultant - 4 wheeled, Grab bars, and transport chair Has a lift chair and a lift bed.   PLOF: Independent; Pension scheme manager  PATIENT GOALS: reduce affects of tremor in RUE  OBJECTIVE:   HAND DOMINANCE: Right  ADLs: Overall ADLs: mod I Eating: setup Equipment: Shower seat with back, Grab bars, Walk in shower, Reacher, Sock aid, Long handled shoe horn, and Long handled sponge  IADLs: Shopping: able to do light shopping with shopping cart Light housekeeping: cleans bathrooms minus the tubs and uses light weight vacuum cleaners Meal Prep: Mod  I Community mobility: driving Medication management: mod I Financial management: 50/50 with wife Handwriting: 25% legible  MOBILITY STATUS: Needs Assist: uses SPC and Hx of falls  ACTIVITY TOLERANCE: Activity tolerance: fair  FUNCTIONAL OUTCOME MEASURES: To be assessed during future visits  UPPER EXTREMITY ROM:     AROM Right (eval) Left (eval)  Shoulder flexion WNL WNL  Shoulder abduction WNL WNL  Elbow flexion WNL WNL  Elbow extension WNL WNL  Wrist flexion 24 WNL  Wrist extension 15 WNL  Wrist pronation WFL WNL  Wrist supination WFL WNL   Digit Composite Flexion WNL WNL  Digit Composite Extension WNL WNL  Digit Opposition WNL WNL  (Blank rows = not tested)  UPPER EXTREMITY MMT:     MMT Right (eval) Left (eval)  Shoulder flexion [facility] WNL  Shoulder abduction WFL WNL  Elbow flexion WFL WNL  Elbow extension WFL WNL  (Blank rows = not tested)  HAND FUNCTION: Grip strength: Right: 31.3 lbs; Left: 83.7 lbs  COORDINATION: 9 Hole Peg test: Right: 34.91 sec; Left: 48.99 sec Box and Blocks:  Right 34 blocks, Left 41blocks Tremors: Right, Left, and right worse than left  SENSATION: Mild, occasional paresthesias in R hand and fingers  EDEMA: mild arthritic changes to R wrist  MUSCLE TONE: RUE: Rigidity; mild  COGNITION: Overall cognitive status: Within functional limits for tasks assessed  VISION: Subjective report: Just got new glasses RX; limited changes Baseline vision: Wears glasses all the time; progressive lenses Visual history: n/a  OBSERVATIONS: Bradykinesia   TODAY'S TREATMENT:  02/24/22 Therapist checked PPT#2, and PPT#4 and updated goals PWR! Hands for PWR! Up, twist and step, min v.c and demonstration. Pt. is limited in R wrist by arthritis. Coordination HEP was initiated, min v.c for amplitude                                                                                                                       PATIENT  EDUCATION: Education details:coordination HEP, see pt instructions Person educated: Patient Education method: Explanation Education comprehension: verbalized understanding  HOME EXERCISE PROGRAM: N/A  GOALS:  SHORT TERM GOALS: Target date: 03/03/2022    Pt will be independent with PD specific HEP.  Baseline: Goal status: INITIAL  2.  Pt will verbalize understanding of adapted strategies to maximize safety and I with ADLs/IADLs. Baseline:  Goal status: INITIAL  3.  Pt will verbalize understanding of ways to prevent future PD related complications and PD community resources.  Baseline:  Goal status: INITIAL  4.  Pt will demonstrate improved ease with self feeding as evidenced by reducing PPT#2 by 3 secs Baseline: 16.28- 02/24/22 Goal status:initial  5. Pt will demonstrate increased ease with dressing as evidenced decreasing PPT#4 by 3 secs   Baseline:16.34-2/13/24 Goal status 16.34 secs   LONG TERM GOALS: Target date: 05/01/2022  Pt will write short paragraph with 100% legibility and no significant decrease in letter size. Baseline: 25% legible Goal status: INITIAL  2.  Patient will complete nine-hole peg with use of L in 40 seconds or less. Baseline: 48 seconds  Goal status: INITIAL  3.  Patient will demonstrate at least 41 lbs R grip strength as needed to open jars and other containers. Baseline: 31 lbs Goal status: INITIAL  ASSESSMENT:  CLINICAL IMPRESSION: Patient is a 77 y.o. male who was seen today for occupational therapy evaluation for Parkinson's. Hx includes multiple back surgeries (x6), hernia repair, L knee surgery, L rotator cuff repair, L trigger finger release, MI, HLD, HTN, CAD, CVA, and essential tremor. Patient currently presents below baseline level of functioning demonstrating functional deficits and impairments as noted below. Pt would  benefit from skilled OT services in the outpatient setting to work on impairments as noted below to help pt return  to PLOF as able.    PERFORMANCE DEFICITS: in functional skills including ADLs, IADLs, coordination, strength, Fine motor control, Gross motor control, mobility, balance, and UE functional use.  IMPAIRMENTS: are limiting patient from ADLs, IADLs, and leisure.   COMORBIDITIES:  may have co-morbidities  that affects occupational performance. Patient will benefit from skilled OT to address above impairments and improve overall function.  MODIFICATION OR ASSISTANCE TO COMPLETE EVALUATION: Min-Moderate modification of tasks or assist with assess necessary to complete an evaluation.  OT OCCUPATIONAL PROFILE AND HISTORY: Problem focused assessment: Including review of records relating to presenting problem.  CLINICAL DECISION MAKING: LOW - limited treatment options, no task modification necessary  REHAB POTENTIAL: Fair given diagnosis  EVALUATION COMPLEXITY: Low   PLAN:  OT FREQUENCY: 2x/week  OT DURATION: 12 weeks  PLANNED INTERVENTIONS: self care/ADL training, therapeutic exercise, therapeutic activity, neuromuscular re-education, manual therapy, passive range of motion, balance training, functional mobility training, splinting, electrical stimulation, ultrasound, paraffin, fluidotherapy, moist heat, patient/family education, cognitive remediation/compensation, visual/perceptual remediation/compensation, energy conservation, coping strategies training, DME and/or AE instructions, and Re-evaluation  RECOMMENDED OTHER SERVICES: none at this time  CONSULTED AND AGREED WITH PLAN OF CARE: Patient  PLAN FOR NEXT SESSION:issue handwriting and PD handout, big movements with ADLS, strategies for donning jacket like a cape and fastening buttons.   Theone Murdoch, OTR/L [phone] Phone: [phone] 12:22 PM 02/24/22

## 2022-02-26 ENCOUNTER — Encounter: Payer: Self-pay | Admitting: Occupational Therapy

## 2022-02-26 ENCOUNTER — Encounter: Payer: Self-pay | Admitting: Neurology

## 2022-02-26 ENCOUNTER — Ambulatory Visit: Payer: Medicare HMO | Admitting: Occupational Therapy

## 2022-02-26 DIAGNOSIS — G20B1 Parkinson's disease with dyskinesia, without mention of fluctuations: Secondary | ICD-10-CM

## 2022-02-26 DIAGNOSIS — R278 Other lack of coordination: Secondary | ICD-10-CM

## 2022-02-26 DIAGNOSIS — M6281 Muscle weakness (generalized): Secondary | ICD-10-CM | POA: Diagnosis not present

## 2022-02-26 DIAGNOSIS — R29898 Other symptoms and signs involving the musculoskeletal system: Secondary | ICD-10-CM

## 2022-02-26 DIAGNOSIS — G250 Essential tremor: Secondary | ICD-10-CM

## 2022-02-26 DIAGNOSIS — R29818 Other symptoms and signs involving the nervous system: Secondary | ICD-10-CM

## 2022-02-26 NOTE — Patient Instructions (Signed)
     Twist 'N Write (pencil) or Pen Again (pen) for writing - can get online/medical supply store

## 2022-02-26 NOTE — Therapy (Signed)
OUTPATIENT OCCUPATIONAL THERAPY PARKINSON'S TREATMENT  Patient Name: Nirmal Soltau MRN: [id number] DOB:10/17/1945, 77 y.o., male Today's Date:   PCP: Ginger Organ., MD  REFERRING PROVIDER: Ludwig Clarks, DO   OT End of Session - 02/26/22 1154     Visit Number 3    Number of Visits 25    Date for OT Re-Evaluation 05/01/22    Authorization Type [employer] Medicare    Progress Note Due on Visit 10    OT Start Time 1154    OT Stop Time 1232    OT Time Calculation (min) 38 min    Activity Tolerance Patient tolerated treatment well    Behavior During Therapy [facility] for tasks assessed/performed              OT End of Session - 02/26/22 1154     Visit Number 3    Number of Visits 25    Date for OT Re-Evaluation 05/01/22    Authorization Type [employer]na Medicare    Progress Note Due on Visit 10    OT Start Time 1154    OT Stop Time 1232    OT Time Calculation (min) 38 min    Activity Tolerance Patient tolerated treatment well    Behavior During Therapy [facility] for tasks assessed/performed             OT End of Session - 02/26/22 1154     Visit Number 3    Number of Visits 25    Date for OT Re-Evaluation 05/01/22    Authorization Type [employer]na Medicare    Progress Note Due on Visit 10    OT Start Time 1154    OT Stop Time 1232    OT Time Calculation (min) 38 min    Activity Tolerance Patient tolerated treatment well    Behavior During Therapy [facility] for tasks assessed/performed               OT End of Session - 02/26/22 1154     Visit Number 3    Number of Visits 25    Date for OT Re-Evaluation 05/01/22    Authorization Type [employer]na Medicare    Progress Note Due on Visit 10    OT Start Time 1154    OT Stop Time 1232    OT Time Calculation (min) 38 min    Activity Tolerance Patient tolerated treatment well    Behavior During Therapy [facility] for tasks assessed/performed              Past Medical History:  Diagnosis Date   Allergic rhinitis    Allergy     Anal fissure    Anxiety    from chronic pain from surgery- on Cymbalta   Arthritis    Asthma    Barrett's esophagus 03/29/2014   Carotid artery disease ([place])    Carotid Doppler normal August, 2007   Cataract    Chest pain    Coronaries normal 1996 /  nuclear..06/2002..normal...EF  56% /  stress echo.. May, 2011.... no  scar or ischemia... rate related RBBB   Diverticulosis    Dyslipidemia    Ejection fraction    EF 60%, stress echo, May, 2011   GERD (gastroesophageal reflux disease)    Headache    History of loop recorder    has since 04/04/15   HTN (hypertension)    takes Metoprolol for PVC control   Hx of colonic polyps    adenomatous   Hx of  colonoscopy    Hyperlipidemia    IFG (impaired fasting glucose)    Palpitations    Benign PVCs   Parkinson disease    Prostate cancer (HCC)    RBBB (right bundle branch block)    rate related   Shingles    Stroke [facility])    TIA   TIA (transient ischemic attack) 02/2015   Per pt, had 2 strokes   Tremor    Hand tremor   Vertigo    Past Surgical History:  Procedure Laterality Date   BACK SURGERY  2002,2009   x 6   CHOLECYSTECTOMY  11/30/2012   with IOC   COLONOSCOPY     ELECTROPHYSIOLOGIC STUDY N/A 09/26/2015   Procedure: V Tach Ablation (PVC);  Surgeon: Will Meredith Leeds, MD;  Location: [place] [facility];  Service: Cardiovascular;  Laterality: N/A;   EP IMPLANTABLE DEVICE N/A 04/04/2015   Procedure: Loop Recorder Insertion;  Surgeon: Will Meredith Leeds, MD;  Location: [place] [facility];  Service: Cardiovascular;  Laterality: N/A;   HERNIA REPAIR     laprascopic   KNEE SURGERY     DECEMBER 2017,LEFT KNEE SCOPED   LEFT HEART CATH AND CORONARY ANGIOGRAPHY N/A 02/05/2021   Procedure: LEFT HEART CATH AND CORONARY ANGIOGRAPHY;  Surgeon: Wellington Hampshire, MD;  Location: [place] [facility];  Service: Cardiovascular;  Laterality: N/A;   NECK SURGERY  2002   POLYPECTOMY     ROTATOR CUFF REPAIR Left    TEE WITHOUT CARDIOVERSION  N/A 04/04/2015   Procedure: TRANSESOPHAGEAL ECHOCARDIOGRAM (TEE);  Surgeon: Lelon Perla, MD;  Location: [facility] ENDOSCOPY;  Service: Cardiovascular;  Laterality: N/A;   TRIGGER FINGER RELEASE Left 12/28/2019   Procedure: RELEASE TRIGGER FINGER/A-1 PULLEY THUMB, MIDDLE AND RING;  Surgeon: Daryll Brod, MD;  Location: [place];  Service: Orthopedics;  Laterality: Left;  FAB   UPPER GASTROINTESTINAL ENDOSCOPY     V Tach ablation  09/26/2015   Patient Active Problem List   Diagnosis Date Noted   Coronary artery disease involving native coronary artery of native heart without angina pectoris 05/28/2021   History of myocardial infarction 02/05/2021   Parkinson's disease    Malignant neoplasm of prostate ([place]) 11/01/2017   Essential tremor 12/06/2015   PVC (premature ventricular contraction) [id number]   Embolic stroke involving left middle cerebral artery ([place]) 04/07/2015   Acute CVA (cerebrovascular accident) ([place]) 03/06/2015   Stroke (cerebrum) ([place]) 03/05/2015   CVA (cerebral infarction) 03/05/2015   Weakness of right upper extremity    Tremor    Ejection fraction    Vertigo    Sleep apnea    Palpitations    Hyperlipidemia with target LDL less than 70    GERD (gastroesophageal reflux disease)    HTN (hypertension)    Chest pain    RBBB (right bundle branch block)    Ejection fraction    Carotid artery disease ([place])     ONSET DATE: 11/18/2021   REFERRING DIAG: G20.A1 (ICD-10-CM) - Parkinson's disease, unspecified whether dyskinesia present, unspecified whether manifestations fluctuate R29.898 (ICD-10-CM) - Rigidity  THERAPY DIAG:  No diagnosis found.  Rationale for Evaluation and Treatment: Rehabilitation  SUBJECTIVE:   SUBJECTIVE STATEMENT: He has been completing exercises but does not feel he is incorporating them consistently into his daily routines.   Pt accompanied by: wife- [name]  PERTINENT HISTORY: PMH: PD, Anxiety, HTN, HLD, multiple back  surgeries/fusion neck and lumbar spine,  NSTEMI, 01/2020, hx of multiple syncopal episodes   PRECAUTIONS:  Fall  WEIGHT BEARING RESTRICTIONS: No  PAIN:  Are you having pain? Yes: NPRS scale: 7/10 Pain location: B knees Pain description: aggravated  Aggravating factors: cold weather Relieving factors: rest, stretching, heat  FALLS: Has patient fallen in last 6 months? Yes. Number of falls 1  LIVING ENVIRONMENT: Lives with: lives with their spouse Lives in: House/apartment - [place]: No Has following equipment at home: Single point cane, Environmental consultant - 4 wheeled, Grab bars, and transport chair Has a lift chair and a lift bed.   PLOF: Independent; Pension scheme manager  PATIENT GOALS: reduce affects of tremor in RUE  OBJECTIVE:   HAND DOMINANCE: Right  ADLs: Overall ADLs: mod I Eating: setup Equipment: Shower seat with back, Grab bars, Walk in shower, Reacher, Sock aid, Long handled shoe horn, and Long handled sponge  IADLs: Shopping: able to do light shopping with shopping cart Light housekeeping: cleans bathrooms minus the tubs and uses light weight vacuum cleaners Meal Prep: Mod I Community mobility: driving Medication management: mod I Financial management: 50/50 with wife Handwriting: 25% legible  MOBILITY STATUS: Needs Assist: uses SPC and Hx of falls  ACTIVITY TOLERANCE: Activity tolerance: fair  FUNCTIONAL OUTCOME MEASURES: To be assessed during future visits  UPPER EXTREMITY ROM:     AROM Right (eval) Left (eval)  Shoulder flexion WNL WNL  Shoulder abduction WNL WNL  Elbow flexion WNL WNL  Elbow extension WNL WNL  Wrist flexion 24 WNL  Wrist extension 15 WNL  Wrist pronation WFL WNL  Wrist supination WFL WNL   Digit Composite Flexion WNL WNL  Digit Composite Extension WNL WNL  Digit Opposition WNL WNL  (Blank rows = not tested)  UPPER EXTREMITY MMT:     MMT Right (eval) Left (eval)  Shoulder flexion WFL WNL  Shoulder abduction WFL  WNL  Elbow flexion WFL WNL  Elbow extension WFL WNL  (Blank rows = not tested)  HAND FUNCTION: Grip strength: Right: 31.3 lbs; Left: 83.7 lbs  COORDINATION: 9 Hole Peg test: Right: 34.91 sec; Left: 48.99 sec Box and Blocks:  Right 34 blocks, Left 41blocks Tremors: Right, Left, and right worse than left  SENSATION: Mild, occasional paresthesias in R hand and fingers  EDEMA: mild arthritic changes to R wrist  MUSCLE TONE: RUE: Rigidity; mild  COGNITION: Overall cognitive status: Within functional limits for tasks assessed  VISION: Subjective report: Just got new glasses RX; limited changes Baseline vision: Wears glasses all the time; progressive lenses Visual history: n/a  OBSERVATIONS: Bradykinesia   TODAY'S TREATMENT:   - Therapeutic activities completed for duration as noted below including: Reviewed pt handout from last session focusing on incorporation of movements into routines in preparation for an activity and during activities as needed to manipulate items especially in RUE as desired.  Pt completed writing practice including tracing of letters and numbers using regular pen, use of adaptive writing utensil as noted in pt instructions, and incorporation of movements into completion in efforts to help manage dyskinesias.  PATIENT EDUCATION: Education details:coordination HEP and use of adaptive writing utensils, see pt instructions Person educated: Patient and Spouse Education method: Explanation, Demonstration, and Handouts Education comprehension: verbalized understanding, returned demonstration, and needs further education  HOME EXERCISE PROGRAM: 2/13: coordination HEP  GOALS:  SHORT TERM GOALS: Target date: 03/03/2022    Pt will be independent with PD specific HEP.  Baseline: Goal status: INITIAL  2.  Pt will verbalize understanding of adapted strategies to maximize safety and I with ADLs/IADLs. Baseline:  Goal  status: INITIAL  3.  Pt will verbalize  understanding of ways to prevent future PD related complications and PD community resources.  Baseline:  Goal status: INITIAL  4.  Pt will demonstrate improved ease with self feeding as evidenced by reducing PPT#2 by 3 secs Baseline: 16.28- 02/24/22 Goal status:initial  5. Pt will demonstrate increased ease with dressing as evidenced decreasing PPT#4 by 3 secs   Baseline:16.34-2/13/24 Goal status 16.34 secs   LONG TERM GOALS: Target date: 05/01/2022  Pt will write short paragraph with 100% legibility and no significant decrease in letter size. Baseline: 25% legible Goal status: INITIAL  2.  Patient will complete nine-hole peg with use of L in 40 seconds or less. Baseline: 48 seconds  Goal status: INITIAL  3.  Patient will demonstrate at least 41 lbs R grip strength as needed to open jars and other containers. Baseline: 31 lbs Goal status: INITIAL  ASSESSMENT:  CLINICAL IMPRESSION: Pt would benefit from skilled OT services in the outpatient setting to work on impairments as noted below to help pt return to PLOF as able.    PERFORMANCE DEFICITS: in functional skills including ADLs, IADLs, coordination, strength, Fine motor control, Gross motor control, mobility, balance, and UE functional use.  IMPAIRMENTS: are limiting patient from ADLs, IADLs, and leisure.   COMORBIDITIES:  may have co-morbidities  that affects occupational performance. Patient will benefit from skilled OT to address above impairments and improve overall function.  MODIFICATION OR ASSISTANCE TO COMPLETE EVALUATION: Min-Moderate modification of tasks or assist with assess necessary to complete an evaluation.  OT OCCUPATIONAL PROFILE AND HISTORY: Problem focused assessment: Including review of records relating to presenting problem.  CLINICAL DECISION MAKING: LOW - limited treatment options, no task modification necessary  REHAB POTENTIAL: Fair given diagnosis  EVALUATION COMPLEXITY: Low   PLAN:  OT  FREQUENCY: 2x/week  OT DURATION: 12 weeks  PLANNED INTERVENTIONS: self care/ADL training, therapeutic exercise, therapeutic activity, neuromuscular re-education, manual therapy, passive range of motion, balance training, functional mobility training, splinting, electrical stimulation, ultrasound, paraffin, fluidotherapy, moist heat, patient/family education, cognitive remediation/compensation, visual/perceptual remediation/compensation, energy conservation, coping strategies training, DME and/or AE instructions, and Re-evaluation  RECOMMENDED OTHER SERVICES: none at this time  CONSULTED AND AGREED WITH PLAN OF CARE: Patient  PLAN FOR NEXT SESSION:issue handwriting and PD handout, big movements with ADLS, strategies for donning jacket like a cape and fastening buttons.   Dennis Bast, OT 02/26/2022, 1:53 PM

## 2022-03-03 ENCOUNTER — Ambulatory Visit: Payer: Medicare HMO | Admitting: Occupational Therapy

## 2022-03-03 ENCOUNTER — Encounter: Payer: Self-pay | Admitting: Occupational Therapy

## 2022-03-03 DIAGNOSIS — M6281 Muscle weakness (generalized): Secondary | ICD-10-CM | POA: Diagnosis not present

## 2022-03-03 DIAGNOSIS — R29818 Other symptoms and signs involving the nervous system: Secondary | ICD-10-CM

## 2022-03-03 DIAGNOSIS — R29898 Other symptoms and signs involving the musculoskeletal system: Secondary | ICD-10-CM

## 2022-03-03 DIAGNOSIS — R278 Other lack of coordination: Secondary | ICD-10-CM

## 2022-03-03 NOTE — Therapy (Signed)
OUTPATIENT OCCUPATIONAL THERAPY [facility]  Patient Name: Joshua [name] MRN: [id number] DOB:1945/06/09, 77 y.o., male Today's Date:   PCP: Joshua [name]., MD  REFERRING PROVIDER: Ludwig Clarks, DO   OT End of Session - 03/03/22 1236     Visit Number 4    Number of Visits 25    Date for OT Re-Evaluation 05/01/22    Authorization Type [employer]    Progress Note Due on Visit 10    OT Start Time 1235    OT Stop Time 1315    OT Time Calculation (min) 40 min    Activity Tolerance Patient tolerated treatment well    Behavior During Therapy [name] for tasks assessed/performed             Past Medical History:  Diagnosis Date   Allergic rhinitis    Allergy    Anal fissure    Anxiety    from chronic pain from surgery- on Cymbalta   Arthritis    Asthma    Barrett's esophagus 03/29/2014   Carotid artery disease ([place])    Carotid Doppler normal August, 2007   Cataract    Chest pain    Coronaries normal 1996 /  nuclear..06/2002..normal...EF  56% /  stress echo.. May, 2011.... no  scar or ischemia... rate related RBBB   Diverticulosis    Dyslipidemia    Ejection fraction    EF 60%, stress echo, May, 2011   GERD (gastroesophageal reflux disease)    Headache    History of loop recorder    has since 04/04/15   HTN (hypertension)    takes Metoprolol for PVC control   Hx of colonic polyps    adenomatous   Hx of colonoscopy    Hyperlipidemia    IFG (impaired fasting glucose)    Palpitations    Benign PVCs   Parkinson disease    Prostate cancer (HCC)    RBBB (right bundle branch block)    rate related   Shingles    Stroke [name])    TIA   TIA (transient ischemic attack) 02/2015   Per pt, had 2 strokes   Tremor    Hand tremor   Vertigo    Past Surgical History:  Procedure Laterality Date   BACK SURGERY  2002,2009   x 6   CHOLECYSTECTOMY  11/30/2012   with IOC   COLONOSCOPY     ELECTROPHYSIOLOGIC STUDY N/A 09/26/2015   Procedure: V Tach  Ablation (PVC);  Surgeon: Joshua Meredith Leeds, MD;  Location: [name];  Service: Cardiovascular;  Laterality: N/A;   EP IMPLANTABLE DEVICE N/A 04/04/2015   Procedure: Loop Recorder Insertion;  Surgeon: Joshua Meredith Leeds, MD;  Location: [name];  Service: Cardiovascular;  Laterality: N/A;   HERNIA REPAIR     laprascopic   KNEE SURGERY     DECEMBER 2017,LEFT KNEE SCOPED   LEFT HEART CATH AND CORONARY ANGIOGRAPHY N/A 02/05/2021   Procedure: LEFT HEART CATH AND CORONARY ANGIOGRAPHY;  Surgeon: Joshua Hampshire, MD;  Location: [name];  Service: Cardiovascular;  Laterality: N/A;   NECK SURGERY  2002   POLYPECTOMY     ROTATOR CUFF REPAIR Left    TEE WITHOUT CARDIOVERSION N/A 04/04/2015   Procedure: TRANSESOPHAGEAL ECHOCARDIOGRAM (TEE);  Surgeon: Joshua Perla, MD;  Location: [name]OS[facility];  Service: Cardiovascular;  Laterality: N/A;   TRIGGER FINGER RELEASE Left 12/28/2019   Procedure: RELEASE TRIGGER FINGER/A-1 PULLEY THUMB, MIDDLE AND RING;  Surgeon: Joshua Brod, MD;  Location: [name];  Service: Orthopedics;  Laterality: Left;  FAB   UPPER GASTROINTESTINAL ENDOSCOPY     V Tach ablation  09/26/2015   Patient Active Problem List   Diagnosis Date Noted   Coronary artery disease involving native coronary artery of native heart without angina pectoris 05/28/2021   History of myocardial infarction 02/05/2021   Parkinson's disease    Malignant neoplasm of prostate ([place]) 11/01/2017   Essential tremor 12/06/2015   PVC (premature ventricular contraction) [id number]   Embolic stroke involving left middle cerebral artery ([place]) 04/07/2015   Acute CVA (cerebrovascular accident) ([place]) 03/06/2015   Stroke (cerebrum) ([place]) 03/05/2015   CVA (cerebral infarction) 03/05/2015   Weakness of right upper extremity    Tremor    Ejection fraction    Vertigo    Sleep apnea    Palpitations    Hyperlipidemia with target LDL less than 70    GERD  (gastroesophageal reflux disease)    HTN (hypertension)    Chest pain    RBBB (right bundle branch block)    Ejection fraction    Carotid artery disease ([place])     ONSET DATE: 11/18/2021   REFERRING DIAG: G20.A1 (ICD-10-CM) - Parkinson's disease, unspecified whether dyskinesia present, unspecified whether manifestations fluctuate R29.898 (ICD-10-CM) - Rigidity  THERAPY DIAG:  No diagnosis found.  Rationale for Evaluation and Treatment: Rehabilitation  SUBJECTIVE:   SUBJECTIVE STATEMENT: No falls.  Pt accompanied by: wife- Joshua [name]  PERTINENT HISTORY: PMH: PD, Anxiety, HTN, HLD, multiple back surgeries/fusion neck and lumbar spine,  NSTEMI, 01/2020, hx of multiple syncopal episodes   PRECAUTIONS: Fall  WEIGHT BEARING RESTRICTIONS: No  PAIN:  Are you having pain? Yes: NPRS scale: 6/10 Pain location: B knees Pain description: aggravated  Aggravating factors: cold weather Relieving factors: rest, stretching, heat  FALLS: Has patient fallen in last 6 months? Yes. Number of falls 1  LIVING ENVIRONMENT: Lives with: lives with their spouse Lives in: House/apartment - Joshua [name]: No Has following equipment at home: Single point cane, Environmental consultant - 4 wheeled, Grab bars, and transport chair Has a lift chair and a lift bed.   PLOF: Independent; Pension scheme manager  PATIENT GOALS: reduce affects of tremor in RUE  OBJECTIVE:   HAND DOMINANCE: Right  ADLs: Overall ADLs: mod I Eating: setup Equipment: Shower seat with back, Grab bars, Walk in shower, Reacher, Sock aid, Long handled shoe horn, and Long handled sponge  IADLs: Shopping: able to do light shopping with shopping cart Light housekeeping: cleans bathrooms minus the tubs and uses light weight vacuum cleaners Meal Prep: Mod I Community mobility: driving Medication management: mod I Financial management: 50/50 with wife Handwriting: 25% legible  MOBILITY STATUS: Needs Assist: uses SPC and Hx of  falls  ACTIVITY TOLERANCE: Activity tolerance: fair  FUNCTIONAL OUTCOME MEASURES: To be assessed during future visits  UPPER EXTREMITY ROM:     AROM Right (eval) Left (eval)  Shoulder flexion WNL WNL  Shoulder abduction WNL WNL  Elbow flexion WNL WNL  Elbow extension WNL WNL  Wrist flexion 24 WNL  Wrist extension 15 WNL  Wrist pronation WFL WNL  Wrist supination WFL WNL   Digit Composite Flexion WNL WNL  Digit Composite Extension WNL WNL  Digit Opposition WNL WNL  (Blank rows = not tested)  UPPER EXTREMITY MMT:     MMT Right (eval) Left (eval)  Shoulder flexion [name] WNL  Shoulder abduction WFL WNL  Elbow flexion WFL WNL  Elbow  extension WFL WNL  (Blank rows = not tested)  HAND FUNCTION: Grip strength: Right: 31.3 lbs; Left: 83.7 lbs  COORDINATION: 9 Hole Peg test: Right: 34.91 sec; Left: 48.99 sec Box and Blocks:  Right 34 blocks, Left 41blocks Tremors: Right, Left, and right worse than left  SENSATION: Mild, occasional paresthesias in R hand and fingers  EDEMA: mild arthritic changes to R wrist  MUSCLE TONE: RUE: Rigidity; mild  COGNITION: Overall cognitive status: Within functional limits for tasks assessed  VISION: Subjective report: Just got new glasses RX; limited changes Baseline vision: Wears glasses all the time; progressive lenses Visual history: n/a  OBSERVATIONS: Bradykinesia   TODAY'S TREATMENT:   Pt issued PWR! Seated and performed basic 4 x 20 reps. Pt cued to "clap" for PWR! Twist and not let back hand come to front hand. Pt cued to march big and "stomp" for PWR! Step. Pt does report previous back surgeries, therefore instructed to stop PWR! Twist if it bothers him.  Pt issued handwriting strategies and reviewed. Discussed ways to reduce tremors and increase size/legibility. Began discussing ways to reduce tremors for eating including stabilizing forearm/elbow, raising plate up closer to mouth prn, and adapted utensils as last resort.    PATIENT EDUCATION: Education details:PWR! Seated, handwriting strategies Person educated: Patient and Spouse Education method: Explanation, Demonstration, and Handouts Education comprehension: verbalized understanding, returned demonstration, and needs further education  HOME EXERCISE PROGRAM: 2/13: coordination HEP, PWR! Hands 03/03/22: PWR! Seated, handwriting strategies  GOALS:  SHORT TERM GOALS: Target date: 03/03/2022    Pt Joshua be independent with PD specific HEP.  Baseline: Goal status: IN PROGRESS  2.  Pt Joshua verbalize understanding of adapted strategies to maximize safety and I with ADLs/IADLs. Baseline:  Goal status: IN PROGRESS  3.  Pt Joshua verbalize understanding of ways to prevent future PD related complications and PD community resources.  Baseline:  Goal status: INITIAL  4.  Pt Joshua demonstrate improved ease with self feeding as evidenced by reducing PPT#2 by 3 secs Baseline: 16.28- 02/24/22 Goal status:initial  5. Pt Joshua demonstrate increased ease with dressing as evidenced decreasing PPT#4 by 3 secs   Baseline:16.34-2/13/24 Goal status 16.34 secs   LONG TERM GOALS: Target date: 05/01/2022  Pt Joshua write short paragraph with 100% legibility and no significant decrease in letter size. Baseline: 25% legible Goal status: INITIAL  2.  Patient Joshua complete nine-hole peg with use of L in 40 seconds or less. Baseline: 48 seconds  Goal status: INITIAL  3.  Patient Joshua demonstrate at least 41 lbs R grip strength as needed to open jars and other containers. Baseline: 31 lbs Goal status: INITIAL  ASSESSMENT:  CLINICAL IMPRESSION: Pt responds well to large amplitude cueing. Pt receptive to recommendations  PERFORMANCE DEFICITS: in functional skills including ADLs, IADLs, coordination, strength, Fine motor control, Gross motor control, mobility, balance, and UE functional use.  IMPAIRMENTS: are limiting patient from ADLs, IADLs, and leisure.    COMORBIDITIES:  may have co-morbidities  that affects occupational performance. Patient Joshua benefit from skilled OT to address above impairments and improve overall function.  MODIFICATION OR ASSISTANCE TO COMPLETE EVALUATION: Min-Moderate modification of tasks or assist with assess necessary to complete an evaluation.  OT OCCUPATIONAL PROFILE AND HISTORY: Problem focused assessment: Including review of records relating to presenting problem.  CLINICAL DECISION MAKING: LOW - limited treatment options, no task modification necessary  REHAB POTENTIAL: Fair given diagnosis  EVALUATION COMPLEXITY: Low   PLAN:  OT FREQUENCY: 2x/week  OT  DURATION: 12 weeks  PLANNED INTERVENTIONS: self care/ADL training, therapeutic exercise, therapeutic activity, neuromuscular re-education, manual therapy, passive range of motion, balance training, functional mobility training, splinting, electrical stimulation, ultrasound, paraffin, fluidotherapy, moist heat, patient/family education, cognitive remediation/compensation, visual/perceptual remediation/compensation, energy conservation, coping strategies training, DME and/or AE instructions, and Re-evaluation  RECOMMENDED OTHER [name]: none at this time  CONSULTED AND AGREED WITH PLAN OF CARE: Patient  PLAN FOR NEXT SESSION: big movements with ADLS, strategies for donning jacket like a cape and fastening buttons (pt to bring in jacket to practice)    Hans Eden, OT 03/03/2022, 12:37 PM

## 2022-03-04 NOTE — Therapy (Unsigned)
OUTPATIENT OCCUPATIONAL THERAPY [facility]  Patient Name: Joshua [name] MRN: [id number] DOB:11/12/1945, 77 y.o., male Today's Date:   PCP: Ginger Organ., MD  REFERRING PROVIDER: Ludwig Clarks, DO     Past Medical History:  Diagnosis Date   Allergic rhinitis    Allergy    Anal fissure    Anxiety    from chronic pain from surgery- on Cymbalta   Arthritis    Asthma    Barrett's esophagus 03/29/2014   Carotid artery disease ([place])    Carotid Doppler normal August, 2007   Cataract    Chest pain    Coronaries normal 1996 /  nuclear..06/2002..normal...EF  56% /  stress echo.. May, 2011.... no  scar or ischemia... rate related RBBB   Diverticulosis    Dyslipidemia    Ejection fraction    EF 60%, stress echo, May, 2011   GERD (gastroesophageal reflux disease)    Headache    History of loop recorder    has since 04/04/15   HTN (hypertension)    takes Metoprolol for PVC control   Hx of colonic polyps    adenomatous   Hx of colonoscopy    Hyperlipidemia    IFG (impaired fasting glucose)    Palpitations    Benign PVCs   Parkinson disease    Prostate cancer (HCC)    RBBB (right bundle branch block)    rate related   Shingles    Stroke [facility])    TIA   TIA (transient ischemic attack) 02/2015   Per pt, had 2 strokes   Tremor    Hand tremor   Vertigo    Past Surgical History:  Procedure Laterality Date   BACK SURGERY  2002,2009   x 6   CHOLECYSTECTOMY  11/30/2012   with IOC   COLONOSCOPY     ELECTROPHYSIOLOGIC STUDY N/A 09/26/2015   Procedure: V Tach Ablation (PVC);  Surgeon: Will Meredith Leeds, MD;  Location: [place] [facility];  Service: Cardiovascular;  Laterality: N/A;   EP IMPLANTABLE DEVICE N/A 04/04/2015   Procedure: Loop Recorder Insertion;  Surgeon: Will Meredith Leeds, MD;  Location: [place] [facility];  Service: Cardiovascular;  Laterality: N/A;   HERNIA REPAIR     laprascopic   KNEE SURGERY     DECEMBER 2017,LEFT KNEE SCOPED    LEFT HEART CATH AND CORONARY ANGIOGRAPHY N/A 02/05/2021   Procedure: LEFT HEART CATH AND CORONARY ANGIOGRAPHY;  Surgeon: Wellington Hampshire, MD;  Location: [place] [facility];  Service: Cardiovascular;  Laterality: N/A;   NECK SURGERY  2002   POLYPECTOMY     ROTATOR CUFF REPAIR Left    TEE WITHOUT CARDIOVERSION N/A 04/04/2015   Procedure: TRANSESOPHAGEAL ECHOCARDIOGRAM (TEE);  Surgeon: Lelon Perla, MD;  Location: [facility] ENDOSCOPY;  Service: Cardiovascular;  Laterality: N/A;   TRIGGER FINGER RELEASE Left 12/28/2019   Procedure: RELEASE TRIGGER FINGER/A-1 PULLEY THUMB, MIDDLE AND RING;  Surgeon: Daryll Brod, MD;  Location: [place];  Service: Orthopedics;  Laterality: Left;  FAB   UPPER GASTROINTESTINAL ENDOSCOPY     V Tach ablation  09/26/2015   Patient Active Problem List   Diagnosis Date Noted   Coronary artery disease involving native coronary artery of native heart without angina pectoris 05/28/2021   History of myocardial infarction 02/05/2021   Parkinson's disease    Malignant neoplasm of prostate ([place]) 11/01/2017   Essential tremor 12/06/2015   PVC (premature ventricular contraction) [id number]   Embolic stroke involving left  middle cerebral artery ([place]) 04/07/2015   Acute CVA (cerebrovascular accident) ([place]) 03/06/2015   Stroke (cerebrum) ([place]) 03/05/2015   CVA (cerebral infarction) 03/05/2015   Weakness of right upper extremity    Tremor    Ejection fraction    Vertigo    Sleep apnea    Palpitations    Hyperlipidemia with target LDL less than 70    GERD (gastroesophageal reflux disease)    HTN (hypertension)    Chest pain    RBBB (right bundle branch block)    Ejection fraction    Carotid artery disease ([place])     ONSET DATE: 11/18/2021   REFERRING DIAG: G20.A1 (ICD-10-CM) - Parkinson's disease, unspecified whether dyskinesia present, unspecified whether manifestations fluctuate R29.898 (ICD-10-CM) - Rigidity  THERAPY DIAG:  No diagnosis  found.  Rationale for Evaluation and Treatment: Rehabilitation  SUBJECTIVE:   SUBJECTIVE STATEMENT: No falls.  Pt accompanied by: wife- [name]  PERTINENT HISTORY: PMH: PD, Anxiety, HTN, HLD, multiple back surgeries/fusion neck and lumbar spine,  NSTEMI, 01/2020, hx of multiple syncopal episodes   PRECAUTIONS: Fall  WEIGHT BEARING RESTRICTIONS: No  PAIN:  Are you having pain? Yes: NPRS scale: 6/10 Pain location: B knees Pain description: aggravated  Aggravating factors: cold weather Relieving factors: rest, stretching, heat  FALLS: Has patient fallen in last 6 months? Yes. Number of falls 1  LIVING ENVIRONMENT: Lives with: lives with their spouse Lives in: House/apartment - [place]: No Has following equipment at home: Single point cane, Environmental consultant - 4 wheeled, Grab bars, and transport chair Has a lift chair and a lift bed.   PLOF: Independent; Pension scheme manager  PATIENT GOALS: reduce affects of tremor in RUE  OBJECTIVE:   HAND DOMINANCE: Right  ADLs: Overall ADLs: mod I Eating: setup Equipment: Shower seat with back, Grab bars, Walk in shower, Reacher, Sock aid, Long handled shoe horn, and Long handled sponge  IADLs: Shopping: able to do light shopping with shopping cart Light housekeeping: cleans bathrooms minus the tubs and uses light weight vacuum cleaners Meal Prep: Mod I Community mobility: driving Medication management: mod I Financial management: 50/50 with wife Handwriting: 25% legible  MOBILITY STATUS: Needs Assist: uses SPC and Hx of falls  ACTIVITY TOLERANCE: Activity tolerance: fair  FUNCTIONAL OUTCOME MEASURES: To be assessed during future visits  UPPER EXTREMITY ROM:     AROM Right (eval) Left (eval)  Shoulder flexion WNL WNL  Shoulder abduction WNL WNL  Elbow flexion WNL WNL  Elbow extension WNL WNL  Wrist flexion 24 WNL  Wrist extension 15 WNL  Wrist pronation WFL WNL  Wrist supination WFL WNL   Digit Composite  Flexion WNL WNL  Digit Composite Extension WNL WNL  Digit Opposition WNL WNL  (Blank rows = not tested)  UPPER EXTREMITY MMT:     MMT Right (eval) Left (eval)  Shoulder flexion WFL WNL  Shoulder abduction WFL WNL  Elbow flexion WFL WNL  Elbow extension WFL WNL  (Blank rows = not tested)  HAND FUNCTION: Grip strength: Right: 31.3 lbs; Left: 83.7 lbs  COORDINATION: 9 Hole Peg test: Right: 34.91 sec; Left: 48.99 sec Box and Blocks:  Right 34 blocks, Left 41blocks Tremors: Right, Left, and right worse than left  SENSATION: Mild, occasional paresthesias in R hand and fingers  EDEMA: mild arthritic changes to R wrist  MUSCLE TONE: RUE: Rigidity; mild  COGNITION: Overall cognitive status: Within functional limits for tasks assessed  VISION: Subjective report: Just got new glasses RX; limited changes Baseline vision:  Wears glasses all the time; progressive lenses Visual history: n/a  OBSERVATIONS: Bradykinesia   TODAY'S TREATMENT:   Pt issued PWR! Seated and performed basic 4 x 20 reps. Pt cued to "clap" for PWR! Twist and not let back hand come to front hand. Pt cued to march big and "stomp" for PWR! Step. Pt does report previous back surgeries, therefore instructed to stop PWR! Twist if it bothers him.  Pt issued handwriting strategies and reviewed. Discussed ways to reduce tremors and increase size/legibility. Began discussing ways to reduce tremors for eating including stabilizing forearm/elbow, raising plate up closer to mouth prn, and adapted utensils as last resort.   PATIENT EDUCATION: Education details:PWR! Seated, handwriting strategies Person educated: Patient and Spouse Education method: Explanation, Demonstration, and Handouts Education comprehension: verbalized understanding, returned demonstration, and needs further education  HOME EXERCISE PROGRAM: 2/13: coordination HEP, PWR! Hands 03/03/22: PWR! Seated, handwriting strategies  GOALS:  SHORT TERM  GOALS: Target date: 03/03/2022    Pt will be independent with PD specific HEP.  Baseline: Goal status: IN PROGRESS  2.  Pt will verbalize understanding of adapted strategies to maximize safety and I with ADLs/IADLs. Baseline:  Goal status: IN PROGRESS  3.  Pt will verbalize understanding of ways to prevent future PD related complications and PD community resources.  Baseline:  Goal status: INITIAL  4.  Pt will demonstrate improved ease with self feeding as evidenced by reducing PPT#2 by 3 secs Baseline: 16.28- 02/24/22 Goal status:initial  5. Pt will demonstrate increased ease with dressing as evidenced decreasing PPT#4 by 3 secs   Baseline:16.34-2/13/24 Goal status 16.34 secs   LONG TERM GOALS: Target date: 05/01/2022  Pt will write short paragraph with 100% legibility and no significant decrease in letter size. Baseline: 25% legible Goal status: INITIAL  2.  Patient will complete nine-hole peg with use of L in 40 seconds or less. Baseline: 48 seconds  Goal status: INITIAL  3.  Patient will demonstrate at least 41 lbs R grip strength as needed to open jars and other containers. Baseline: 31 lbs Goal status: INITIAL  ASSESSMENT:  CLINICAL IMPRESSION: Pt responds well to large amplitude cueing. Pt receptive to recommendations  PERFORMANCE DEFICITS: in functional skills including ADLs, IADLs, coordination, strength, Fine motor control, Gross motor control, mobility, balance, and UE functional use.  IMPAIRMENTS: are limiting patient from ADLs, IADLs, and leisure.   COMORBIDITIES:  may have co-morbidities  that affects occupational performance. Patient will benefit from skilled OT to address above impairments and improve overall function.  MODIFICATION OR ASSISTANCE TO COMPLETE EVALUATION: Min-Moderate modification of tasks or assist with assess necessary to complete an evaluation.  OT OCCUPATIONAL PROFILE AND HISTORY: Problem focused assessment: Including review of  records relating to presenting problem.  CLINICAL DECISION MAKING: LOW - limited treatment options, no task modification necessary  REHAB POTENTIAL: Fair given diagnosis  EVALUATION COMPLEXITY: Low   PLAN:  OT FREQUENCY: 2x/week  OT DURATION: 12 weeks  PLANNED INTERVENTIONS: self care/ADL training, therapeutic exercise, therapeutic activity, neuromuscular re-education, manual therapy, passive range of motion, balance training, functional mobility training, splinting, electrical stimulation, ultrasound, paraffin, fluidotherapy, moist heat, patient/family education, cognitive remediation/compensation, visual/perceptual remediation/compensation, energy conservation, coping strategies training, DME and/or AE instructions, and Re-evaluation  RECOMMENDED OTHER SERVICES: none at this time  CONSULTED AND AGREED WITH PLAN OF CARE: Patient  PLAN FOR NEXT SESSION: big movements with ADLS, strategies for donning jacket like a cape and fastening buttons (pt to bring in jacket to practice)  Dennis Bast, OT 03/04/2022, 5:47 PM

## 2022-03-05 ENCOUNTER — Encounter: Payer: Self-pay | Admitting: Occupational Therapy

## 2022-03-05 ENCOUNTER — Ambulatory Visit: Payer: Medicare HMO | Admitting: Occupational Therapy

## 2022-03-05 DIAGNOSIS — G250 Essential tremor: Secondary | ICD-10-CM

## 2022-03-05 DIAGNOSIS — R29818 Other symptoms and signs involving the nervous system: Secondary | ICD-10-CM

## 2022-03-05 DIAGNOSIS — R278 Other lack of coordination: Secondary | ICD-10-CM

## 2022-03-05 DIAGNOSIS — M6281 Muscle weakness (generalized): Secondary | ICD-10-CM | POA: Diagnosis not present

## 2022-03-05 DIAGNOSIS — R29898 Other symptoms and signs involving the musculoskeletal system: Secondary | ICD-10-CM

## 2022-03-05 DIAGNOSIS — G20B1 Parkinson's disease with dyskinesia, without mention of fluctuations: Secondary | ICD-10-CM

## 2022-03-05 NOTE — Patient Instructions (Addendum)
Performing Daily Activities with Big Movements  Pick at least 2 activities a day and perform with BIG, DELIBERATE movements/effort.  Try different activities each day. This can make the activity easier and turn daily activities into exercise to prevent problems in the future!  If you are standing during the activity, make sure to keep feet apart and stand with good/big/PWR! UP posture.  Examples: Dressing  Pull-over shirt:  good posture, bring shirt to head (don't bring head down), deliberately push arms into sleeves Jacket:  stand with feet apart, twist when putting on jacket, deliberate push arms into sleeves Underwear/Pants:  Sit, lean forward, and push foot into pants deliberately Open hands to pull down shirt/put on socks/pull up pants--get more material in your hand Buttoning - Open hands big (PWR! Hands) before fastening each button, deliberate movement (angry buttons--push button through hole), unfasten by using pull-push method  Bathing - Wash/dry with long strokes Brushing your teeth - Big, slow movements Cutting food - Long deliberate cuts, put tip of knife down in front of food Eating - Hold utensil in the middle (not the end), hold fork straight up and down to stab food Picking up a cup/bottle - Open hand up big and get object all the way in palm Opening jar/bottle - Move as much as you can with each turn, twist wrist Putting on seatbelt - Feet apart, twist when reaching, look at where you are reaching Hanging up clothes/getting clothes down from closet - Reach with big effort, open hand, straighten elbow Putting away groceries/dishes - Reach with big effort Wiping counter/table - Move in big, long strokes, open hand Stirring while cooking - Exaggerate movement Cleaning windows - Feet apart, move in big, long strokes Sweeping/Raking- Feet apart and one foot in front of the other, move arms in big, long strokes Vacuuming - Feet apart and one in front of the other, push with big  movement Folding clothes - Exaggerate arm movements Washing car - Move in big, long strokes, feet apart Changing light bulb or Using a screwdriver - Move as much as you can with each turn, twist wrist Walking into a store/restaurant - Walk with big steps, good posture, swing arms if able Standing up from a chair/recliner/sofa - Scoot forward, lean forward, and stand with big effort Picking up something from floor/reaching in low cabinet - Get close to object, Position feet apart and one in front of the other.

## 2022-03-10 ENCOUNTER — Ambulatory Visit: Payer: Medicare HMO | Admitting: Occupational Therapy

## 2022-03-12 ENCOUNTER — Ambulatory Visit: Payer: Medicare HMO | Admitting: Occupational Therapy

## 2022-03-12 ENCOUNTER — Encounter: Payer: Self-pay | Admitting: Occupational Therapy

## 2022-03-12 DIAGNOSIS — R29898 Other symptoms and signs involving the musculoskeletal system: Secondary | ICD-10-CM

## 2022-03-12 DIAGNOSIS — R29818 Other symptoms and signs involving the nervous system: Secondary | ICD-10-CM

## 2022-03-12 DIAGNOSIS — G20B1 Parkinson's disease with dyskinesia, without mention of fluctuations: Secondary | ICD-10-CM

## 2022-03-12 DIAGNOSIS — R278 Other lack of coordination: Secondary | ICD-10-CM

## 2022-03-12 DIAGNOSIS — M6281 Muscle weakness (generalized): Secondary | ICD-10-CM

## 2022-03-12 DIAGNOSIS — G250 Essential tremor: Secondary | ICD-10-CM

## 2022-03-12 NOTE — Therapy (Signed)
OUTPATIENT OCCUPATIONAL THERAPY PARKINSON'S TREATMENT  Patient Name: Joshua [name] MRN: [id number] DOB:10/29/1945, 77 y.o., male Today's Date:   PCP: Ginger Organ., MD  REFERRING PROVIDER: Ludwig Clarks, DO   OT End of Session - 03/12/22 1352     Visit Number 6    Number of Visits 25    Date for OT Re-Evaluation 05/01/22    Authorization Type [employer]    Progress Note Due on Visit 10    OT Start Time 1148    OT Stop Time 1230    OT Time Calculation (min) 42 min    Activity Tolerance Patient tolerated treatment well    Behavior During Therapy [facility] for tasks assessed/performed              Past Medical History:  Diagnosis Date   Allergic rhinitis    Allergy    Anal fissure    Anxiety    from chronic pain from surgery- on Cymbalta   Arthritis    Asthma    Barrett's esophagus 03/29/2014   Carotid artery disease ([place])    Carotid Doppler normal August, 2007   Cataract    Chest pain    Coronaries normal 1996 /  nuclear..06/2002..normal...EF  56% /  stress echo.. May, 2011.... no  scar or ischemia... rate related RBBB   Diverticulosis    Dyslipidemia    Ejection fraction    EF 60%, stress echo, May, 2011   GERD (gastroesophageal reflux disease)    Headache    History of loop recorder    has since 04/04/15   HTN (hypertension)    takes Metoprolol for PVC control   Hx of colonic polyps    adenomatous   Hx of colonoscopy    Hyperlipidemia    IFG (impaired fasting glucose)    Palpitations    Benign PVCs   Parkinson disease    Prostate cancer (HCC)    RBBB (right bundle branch block)    rate related   Shingles    Stroke [facility])    TIA   TIA (transient ischemic attack) 02/2015   Per pt, had 2 strokes   Tremor    Hand tremor   Vertigo    Past Surgical History:  Procedure Laterality Date   BACK SURGERY  2002,2009   x 6   CHOLECYSTECTOMY  11/30/2012   with IOC   COLONOSCOPY     ELECTROPHYSIOLOGIC STUDY N/A 09/26/2015   Procedure: V  Tach Ablation (PVC);  Surgeon: Will Meredith Leeds, MD;  Location: [place] [facility];  Service: Cardiovascular;  Laterality: N/A;   EP IMPLANTABLE DEVICE N/A 04/04/2015   Procedure: Loop Recorder Insertion;  Surgeon: Will Meredith Leeds, MD;  Location: [place] [facility];  Service: Cardiovascular;  Laterality: N/A;   HERNIA REPAIR     laprascopic   KNEE SURGERY     DECEMBER 2017,LEFT KNEE SCOPED   LEFT HEART CATH AND CORONARY ANGIOGRAPHY N/A 02/05/2021   Procedure: LEFT HEART CATH AND CORONARY ANGIOGRAPHY;  Surgeon: Wellington Hampshire, MD;  Location: [place] [facility];  Service: Cardiovascular;  Laterality: N/A;   NECK SURGERY  2002   POLYPECTOMY     ROTATOR CUFF REPAIR Left    TEE WITHOUT CARDIOVERSION N/A 04/04/2015   Procedure: TRANSESOPHAGEAL ECHOCARDIOGRAM (TEE);  Surgeon: Lelon Perla, MD;  Location: [facility];  Service: Cardiovascular;  Laterality: N/A;   TRIGGER FINGER RELEASE Left 12/28/2019   Procedure: RELEASE TRIGGER FINGER/A-1 PULLEY THUMB, MIDDLE AND RING;  Surgeon: Daryll Brod, MD;  Location: [place];  Service: Orthopedics;  Laterality: Left;  FAB   UPPER GASTROINTESTINAL ENDOSCOPY     V Tach ablation  09/26/2015   Patient Active Problem List   Diagnosis Date Noted   Coronary artery disease involving native coronary artery of native heart without angina pectoris 05/28/2021   History of myocardial infarction 02/05/2021   Parkinson's disease    Malignant neoplasm of prostate ([place]) 11/01/2017   Essential tremor 12/06/2015   PVC (premature ventricular contraction) [id number]   Embolic stroke involving left middle cerebral artery ([place]) 04/07/2015   Acute CVA (cerebrovascular accident) ([place]) 03/06/2015   Stroke (cerebrum) ([place]) 03/05/2015   CVA (cerebral infarction) 03/05/2015   Weakness of right upper extremity    Tremor    Ejection fraction    Vertigo    Sleep apnea    Palpitations    Hyperlipidemia with target LDL less than 70    GERD  (gastroesophageal reflux disease)    HTN (hypertension)    Chest pain    RBBB (right bundle branch block)    Ejection fraction    Carotid artery disease ([place])     ONSET DATE: 11/18/2021   REFERRING DIAG: G20.A1 (ICD-10-CM) - Parkinson's disease, unspecified whether dyskinesia present, unspecified whether manifestations fluctuate R29.898 (ICD-10-CM) - Rigidity  THERAPY DIAG:   THERAPY DIAG:  Muscle weakness (generalized)  Other symptoms and signs involving the nervous system  Other symptoms and signs involving the musculoskeletal system  Parkinson's disease with dyskinesia, unspecified whether manifestations fluctuate  Essential tremor  Other lack of coordination  Rationale for Evaluation and Treatment: Rehabilitation  SUBJECTIVE:   SUBJECTIVE STATEMENT: He reports he had to cancel Monday's visit because of illness.  Pt accompanied by: self  PERTINENT HISTORY: PMH: PD, Anxiety, HTN, HLD, multiple back surgeries/fusion neck and lumbar spine,  NSTEMI, 01/2020, hx of multiple syncopal episodes   PRECAUTIONS: Fall  WEIGHT BEARING RESTRICTIONS: No  PAIN:  Are you having pain? Yes: NPRS scale: 7/10 Pain location: B knees; R wrist (6/10) Pain description: aggravated  Aggravating factors: cold weather Relieving factors: rest, stretching, heat  FALLS: Has patient fallen in last 6 months? Yes. Number of falls 1  LIVING ENVIRONMENT: Lives with: lives with their spouse Lives in: House/apartment - [place]: No Has following equipment at home: Single point cane, Environmental consultant - 4 wheeled, Grab bars, and transport chair Has a lift chair and a lift bed.   PLOF: Independent; Pension scheme manager  PATIENT GOALS: reduce affects of tremor in RUE  OBJECTIVE:   HAND DOMINANCE: Right  ADLs: Overall ADLs: mod I Eating: setup Equipment: Shower seat with back, Grab bars, Walk in shower, Reacher, Sock aid, Long handled shoe horn, and Long handled sponge  IADLs: Shopping:  able to do light shopping with shopping cart Light housekeeping: cleans bathrooms minus the tubs and uses light weight vacuum cleaners Meal Prep: Mod I Community mobility: driving Medication management: mod I Financial management: 50/50 with wife Handwriting: 25% legible  MOBILITY STATUS: Needs Assist: uses SPC and Hx of falls  ACTIVITY TOLERANCE: Activity tolerance: fair  FUNCTIONAL OUTCOME MEASURES: To be assessed during future visits  UPPER EXTREMITY ROM:     AROM Right (eval) Left (eval)  Shoulder flexion WNL WNL  Shoulder abduction WNL WNL  Elbow flexion WNL WNL  Elbow extension WNL WNL  Wrist flexion 24 WNL  Wrist extension 15 WNL  Wrist pronation WFL WNL  Wrist supination [facility] WNL   Digit Composite  Flexion WNL WNL  Digit Composite Extension WNL WNL  Digit Opposition WNL WNL  (Blank rows = not tested)  UPPER EXTREMITY MMT:     MMT Right (eval) Left (eval)  Shoulder flexion [facility] WNL  Shoulder abduction WFL WNL  Elbow flexion WFL WNL  Elbow extension WFL WNL  (Blank rows = not tested)  HAND FUNCTION: Grip strength: Right: 31.3 lbs; Left: 83.7 lbs  COORDINATION: 9 Hole Peg test: Right: 34.91 sec; Left: 48.99 sec Box and Blocks:  Right 34 blocks, Left 41blocks Tremors: Right, Left, and right worse than left  SENSATION: Mild, occasional paresthesias in R hand and fingers  EDEMA: mild arthritic changes to R wrist  MUSCLE TONE: RUE: Rigidity; mild  COGNITION: Overall cognitive status: Within functional limits for tasks assessed  VISION: Subjective report: Just got new glasses RX; limited changes Baseline vision: Wears glasses all the time; progressive lenses Visual history: n/a  OBSERVATIONS: Bradykinesia   TODAY'S TREATMENT:   - Self-care/home management completed for duration as noted below including: Pt demonstrating use of cape method to don jacket x3 cued to think about reaching past R shoulder with donning OT educated pt on visual  schedule for orienting self to next step in daily routine.  PATIENT EDUCATION: Education details:Daily schedule Person educated: Patient Education method: Explanation, Demonstration, and Handouts Education comprehension: verbalized understanding, returned demonstration, and needs further education  HOME EXERCISE PROGRAM: 2/13: coordination HEP, PWR! Hands 03/03/22: PWR! Seated, handwriting strategies 2/22: BIG MOVEMENT strategies with ADLs  GOALS:  SHORT TERM GOALS: Target date: 03/03/2022    Pt will be independent with PD specific HEP.  Baseline: Goal status: IN PROGRESS  2.  Pt will verbalize understanding of adapted strategies to maximize safety and I with ADLs/IADLs. Baseline:  Goal status: IN PROGRESS  3.  Pt will verbalize understanding of ways to prevent future PD related complications and PD community resources.  Baseline:  Goal status: INITIAL  4.  Pt will demonstrate improved ease with self feeding as evidenced by reducing PPT#2 by 3 secs Baseline: 16.28- 02/24/22 Goal status:initial  5. Pt will demonstrate increased ease with dressing as evidenced decreasing PPT#4 by 3 secs   Baseline:16.34-2/13/24 Goal status 16.34 secs   LONG TERM GOALS: Target date: 05/01/2022  Pt will write short paragraph with 100% legibility and no significant decrease in letter size. Baseline: 25% legible Goal status: INITIAL  2.  Patient will complete nine-hole peg with use of L in 40 seconds or less. Baseline: 48 seconds  Goal status: INITIAL  3.  Patient will demonstrate at least 41 lbs R grip strength as needed to open jars and other containers. Baseline: 31 lbs Goal status: INITIAL  ASSESSMENT:  CLINICAL IMPRESSION: Pt responds well to large amplitude cueing. Pt receptive to recommendations and remains good candidate for skilled OT services as needed to improve independence and safety with ADLs and IADLs secondary to PD.   PERFORMANCE DEFICITS: in functional skills  including ADLs, IADLs, coordination, strength, Fine motor control, Gross motor control, mobility, balance, and UE functional use.  IMPAIRMENTS: are limiting patient from ADLs, IADLs, and leisure.   COMORBIDITIES:  may have co-morbidities  that affects occupational performance. Patient will benefit from skilled OT to address above impairments and improve overall function.  MODIFICATION OR ASSISTANCE TO COMPLETE EVALUATION: Min-Moderate modification of tasks or assist with assess necessary to complete an evaluation.  OT OCCUPATIONAL PROFILE AND HISTORY: Problem focused assessment: Including review of records relating to presenting problem.  CLINICAL DECISION MAKING: LOW -  limited treatment options, no task modification necessary  REHAB POTENTIAL: Fair given diagnosis  EVALUATION COMPLEXITY: Low   PLAN:  OT FREQUENCY: 2x/week  OT DURATION: 12 weeks  PLANNED INTERVENTIONS: self care/ADL training, therapeutic exercise, therapeutic activity, neuromuscular re-education, manual therapy, passive range of motion, balance training, functional mobility training, splinting, electrical stimulation, ultrasound, paraffin, fluidotherapy, moist heat, patient/family education, cognitive remediation/compensation, visual/perceptual remediation/compensation, energy conservation, coping strategies training, DME and/or AE instructions, and Re-evaluation  RECOMMENDED OTHER SERVICES: none at this time  CONSULTED AND AGREED WITH PLAN OF CARE: Patient  PLAN FOR NEXT SESSION: review big movements with ADLs, fastening buttons    Dennis Bast, OT 03/12/2022, 5:11 PM

## 2022-03-17 ENCOUNTER — Ambulatory Visit: Payer: Medicare HMO | Attending: Neurology | Admitting: Occupational Therapy

## 2022-03-17 ENCOUNTER — Encounter: Payer: Self-pay | Admitting: Occupational Therapy

## 2022-03-17 DIAGNOSIS — R293 Abnormal posture: Secondary | ICD-10-CM | POA: Diagnosis present

## 2022-03-17 DIAGNOSIS — R2681 Unsteadiness on feet: Secondary | ICD-10-CM | POA: Insufficient documentation

## 2022-03-17 DIAGNOSIS — R29818 Other symptoms and signs involving the nervous system: Secondary | ICD-10-CM | POA: Diagnosis present

## 2022-03-17 DIAGNOSIS — M6281 Muscle weakness (generalized): Secondary | ICD-10-CM | POA: Insufficient documentation

## 2022-03-17 DIAGNOSIS — R29898 Other symptoms and signs involving the musculoskeletal system: Secondary | ICD-10-CM | POA: Insufficient documentation

## 2022-03-17 DIAGNOSIS — R278 Other lack of coordination: Secondary | ICD-10-CM | POA: Diagnosis present

## 2022-03-17 NOTE — Patient Instructions (Addendum)
Ways to prevent future Parkinson's related complications: 1.   Exercise regularly,  2.   Focus on BIGGER movements during daily activities- really reach overhead, straighten elbows and extend fingers 3.   When dressing or reaching for your seatbelt make sure to use your body to assist by twisting while you reach-this can help to minimize stress on the shoulder and reduce the risk of a rotator cuff tear 4.   Swing your arms when you walk! People with PD are at increased risk for frozen shoulder and swinging your arms can reduce this risk. 5. Drink plenty of water and eat a high fiber diet 6. Do NOT take your Parkinson's medication around meals (avoid taking 30 min before a meal, or 1 hour after a meal), especially protein as it blocks the absorption of the medicine and will not work effectively    [place]  Medicine 7:00  Breakfast 9:00  Medicine 10:00  Medicine 1:00  Lunch 2:00  Medicine 4:00  Dinner 6:00  Medicine 7:00  Medicine 10:00

## 2022-03-17 NOTE — Therapy (Signed)
OUTPATIENT OCCUPATIONAL THERAPY [facility]  Patient Name: Joshua [name] MRN: [id number] DOB:01-May-1945, 77 y.o., male Today's Date:   PCP: Ginger Organ., MD  REFERRING PROVIDER: Ludwig Clarks, DO   OT End of Session - 03/17/22 1107     Visit Number 7    Number of Visits 25    Date for OT Re-Evaluation 05/01/22    Authorization Type [employer]    Progress Note Due on Visit 10    OT Start Time 1105    OT Stop Time 1145    OT Time Calculation (min) 40 min    Activity Tolerance Patient tolerated treatment well    Behavior During Therapy [facility] for tasks assessed/performed              Past Medical History:  Diagnosis Date   Allergic rhinitis    Allergy    Anal fissure    Anxiety    from chronic pain from surgery- on Cymbalta   Arthritis    Asthma    Barrett's esophagus 03/29/2014   Carotid artery disease ([place])    Carotid Doppler normal August, 2007   Cataract    Chest pain    Coronaries normal 1996 /  nuclear..06/2002..normal...EF  56% /  stress echo.. May, 2011.... no  scar or ischemia... rate related RBBB   Diverticulosis    Dyslipidemia    Ejection fraction    EF 60%, stress echo, May, 2011   GERD (gastroesophageal reflux disease)    Headache    History of loop recorder    has since 04/04/15   HTN (hypertension)    takes Metoprolol for PVC control   Hx of colonic polyps    adenomatous   Hx of colonoscopy    Hyperlipidemia    IFG (impaired fasting glucose)    Palpitations    Benign PVCs   Parkinson disease    Prostate cancer (HCC)    RBBB (right bundle branch block)    rate related   Shingles    Stroke [facility])    TIA   TIA (transient ischemic attack) 02/2015   Per pt, had 2 strokes   Tremor    Hand tremor   Vertigo    Past Surgical History:  Procedure Laterality Date   BACK SURGERY  2002,2009   x 6   CHOLECYSTECTOMY  11/30/2012   with IOC   COLONOSCOPY     ELECTROPHYSIOLOGIC STUDY N/A 09/26/2015   Procedure: V  Tach Ablation (PVC);  Surgeon: Will Meredith Leeds, MD;  Location: [place] [facility];  Service: Cardiovascular;  Laterality: N/A;   EP IMPLANTABLE DEVICE N/A 04/04/2015   Procedure: Loop Recorder Insertion;  Surgeon: Will Meredith Leeds, MD;  Location: [place] [facility];  Service: Cardiovascular;  Laterality: N/A;   HERNIA REPAIR     laprascopic   KNEE SURGERY     DECEMBER 2017,LEFT KNEE SCOPED   LEFT HEART CATH AND CORONARY ANGIOGRAPHY N/A 02/05/2021   Procedure: LEFT HEART CATH AND CORONARY ANGIOGRAPHY;  Surgeon: Wellington Hampshire, MD;  Location: [place] [facility];  Service: Cardiovascular;  Laterality: N/A;   NECK SURGERY  2002   POLYPECTOMY     ROTATOR CUFF REPAIR Left    TEE WITHOUT CARDIOVERSION N/A 04/04/2015   Procedure: TRANSESOPHAGEAL ECHOCARDIOGRAM (TEE);  Surgeon: Lelon Perla, MD;  Location: [facility] ENDOSCOPY;  Service: Cardiovascular;  Laterality: N/A;   TRIGGER FINGER RELEASE Left 12/28/2019   Procedure: RELEASE TRIGGER FINGER/A-1 PULLEY THUMB, MIDDLE AND RING;  Surgeon: Daryll Brod, MD;  Location: [place];  Service: Orthopedics;  Laterality: Left;  FAB   UPPER GASTROINTESTINAL ENDOSCOPY     V Tach ablation  09/26/2015   Patient Active Problem List   Diagnosis Date Noted   Coronary artery disease involving native coronary artery of native heart without angina pectoris 05/28/2021   History of myocardial infarction 02/05/2021   Parkinson's disease    Malignant neoplasm of prostate ([place]) 11/01/2017   Essential tremor 12/06/2015   PVC (premature ventricular contraction) [id number]   Embolic stroke involving left middle cerebral artery ([place]) 04/07/2015   Acute CVA (cerebrovascular accident) ([place]) 03/06/2015   Stroke (cerebrum) ([place]) 03/05/2015   CVA (cerebral infarction) 03/05/2015   Weakness of right upper extremity    Tremor    Ejection fraction    Vertigo    Sleep apnea    Palpitations    Hyperlipidemia with target LDL less than 70    GERD  (gastroesophageal reflux disease)    HTN (hypertension)    Chest pain    RBBB (right bundle branch block)    Ejection fraction    Carotid artery disease ([place])     ONSET DATE: 11/18/2021   REFERRING DIAG: G20.A1 (ICD-10-CM) - Parkinson's disease, unspecified whether dyskinesia present, unspecified whether manifestations fluctuate R29.898 (ICD-10-CM) - Rigidity  THERAPY DIAG:   THERAPY DIAG:  Other symptoms and signs involving the nervous system  Other symptoms and signs involving the musculoskeletal system  Other lack of coordination  Abnormal posture  Unsteadiness on feet  Muscle weakness (generalized)  Rationale for Evaluation and Treatment: Rehabilitation  SUBJECTIVE:   SUBJECTIVE STATEMENT: We are taking care of our elderly neighbor now  Pt accompanied by: self  PERTINENT HISTORY: PMH: PD, Anxiety, HTN, HLD, multiple back surgeries/fusion neck and lumbar spine,  NSTEMI, 01/2020, hx of multiple syncopal episodes   PRECAUTIONS: Fall  WEIGHT BEARING RESTRICTIONS: No  PAIN:  Are you having pain? Yes: NPRS scale: 7/10 Pain location: Rt knee; R wrist (6/10) Pain description: aggravated  Aggravating factors: cold weather Relieving factors: rest, stretching, heat  FALLS: Has patient fallen in last 6 months? Yes. Number of falls 1  LIVING ENVIRONMENT: Lives with: lives with their spouse Lives in: House/apartment - [place]: No Has following equipment at home: Single point cane, Environmental consultant - 4 wheeled, Grab bars, and transport chair Has a lift chair and a lift bed.   PLOF: Independent; Pension scheme manager  PATIENT GOALS: reduce affects of tremor in RUE  OBJECTIVE:   HAND DOMINANCE: Right  ADLs: Overall ADLs: mod I Eating: setup Equipment: Shower seat with back, Grab bars, Walk in shower, Reacher, Sock aid, Long handled shoe horn, and Long handled sponge  IADLs: Shopping: able to do light shopping with shopping cart Light housekeeping: cleans  bathrooms minus the tubs and uses light weight vacuum cleaners Meal Prep: Mod I Community mobility: driving Medication management: mod I Financial management: 50/50 with wife Handwriting: 25% legible  MOBILITY STATUS: Needs Assist: uses SPC and Hx of falls  ACTIVITY TOLERANCE: Activity tolerance: fair  FUNCTIONAL OUTCOME MEASURES: To be assessed during future visits  UPPER EXTREMITY ROM:     AROM Right (eval) Left (eval)  Shoulder flexion WNL WNL  Shoulder abduction WNL WNL  Elbow flexion WNL WNL  Elbow extension WNL WNL  Wrist flexion 24 WNL  Wrist extension 15 WNL  Wrist pronation WFL WNL  Wrist supination WFL WNL   Digit Composite Flexion WNL WNL  Digit Composite Extension  WNL WNL  Digit Opposition WNL WNL  (Blank rows = not tested)  UPPER EXTREMITY MMT:     MMT Right (eval) Left (eval)  Shoulder flexion [facility] WNL  Shoulder abduction WFL WNL  Elbow flexion WFL WNL  Elbow extension WFL WNL  (Blank rows = not tested)  HAND FUNCTION: Grip strength: Right: 31.3 lbs; Left: 83.7 lbs  COORDINATION: 9 Hole Peg test: Right: 34.91 sec; Left: 48.99 sec Box and Blocks:  Right 34 blocks, Left 41blocks Tremors: Right, Left, and right worse than left  SENSATION: Mild, occasional paresthesias in R hand and fingers  EDEMA: mild arthritic changes to R wrist  MUSCLE TONE: RUE: Rigidity; mild  COGNITION: Overall cognitive status: Within functional limits for tasks assessed  VISION: Subjective report: Just got new glasses RX; limited changes Baseline vision: Wears glasses all the time; progressive lenses Visual history: n/a  OBSERVATIONS: Bradykinesia   TODAY'S TREATMENT:   Reviewed strategies for reducing tremors and greater ease with eating including elbow supported on table, raising plate of food, scooped dishes, and if necessary angled, weighted utensils Pt issued schedule for meal time around medicine schedule - see pt instructions and reviewed avoiding  meals w/ carbidopa-levodopa. Also discussed ways to prevent future related PD complications and issued handout Standing with back against counter - tossing scarf in air and catching with other hand w/ cues to switch hands on the catch, open hand big to grab scarf in center, and toss high. Pt improved with repetition, then added cognitive component (counting backwards).  Standing facing counter - tossing scarves while naming items same color as each scarf incorporating big reaching, wt shifts, trunk rotation bilateral sides Cued to swing arms big with ambulation - pt w/ reduced arm swing on Rt PATIENT EDUCATION: Education details:Daily meds/meal schedule, ways to prevent future related PD complications Person educated: Patient Education method: Explanation, Demonstration, and Handouts Education comprehension: verbalized understanding   HOME EXERCISE PROGRAM: 2/13: coordination HEP, PWR! Hands 03/03/22: PWR! Seated, handwriting strategies 2/22: BIG MOVEMENT strategies with ADLs 03/17/22: medicine/meal schedule, ways to prevent future related PD complications  GOALS:  SHORT TERM GOALS: Target date: 03/03/2022    Pt will be independent with PD specific HEP.  Baseline: Goal status: IN PROGRESS  2.  Pt will verbalize understanding of adapted strategies to maximize safety and I with ADLs/IADLs. Baseline:  Goal status: IN PROGRESS  3.  Pt will verbalize understanding of ways to prevent future PD related complications and PD community resources.  Baseline:  Goal status: MET  4.  Pt will demonstrate improved ease with self feeding as evidenced by reducing PPT#2 by 3 secs Baseline: 16.28- 02/24/22 Goal status: in progress  5. Pt will demonstrate increased ease with dressing as evidenced decreasing PPT#4 by 3 secs   Baseline:16.34-2/13/24 Goal status: in progress 16.34 secs   LONG TERM GOALS: Target date: 05/01/2022  Pt will write short paragraph with 100% legibility and no significant  decrease in letter size. Baseline: 25% legible Goal status: INITIAL  2.  Patient will complete nine-hole peg with use of L in 40 seconds or less. Baseline: 48 seconds  Goal status: INITIAL  3.  Patient will demonstrate at least 41 lbs R grip strength as needed to open jars and other containers. Baseline: 31 lbs Goal status: INITIAL   ASSESSMENT:  CLINICAL IMPRESSION: Pt responds well to large amplitude cueing. Pt receptive to recommendations and remains good candidate for skilled OT services as needed to improve independence and safety with ADLs and  IADLs secondary to PD.   PERFORMANCE DEFICITS: in functional skills including ADLs, IADLs, coordination, strength, Fine motor control, Gross motor control, mobility, balance, and UE functional use.  IMPAIRMENTS: are limiting patient from ADLs, IADLs, and leisure.   COMORBIDITIES:  may have co-morbidities  that affects occupational performance. Patient will benefit from skilled OT to address above impairments and improve overall function.  MODIFICATION OR ASSISTANCE TO COMPLETE EVALUATION: Min-Moderate modification of tasks or assist with assess necessary to complete an evaluation.  OT OCCUPATIONAL PROFILE AND HISTORY: Problem focused assessment: Including review of records relating to presenting problem.  CLINICAL DECISION MAKING: LOW - limited treatment options, no task modification necessary  REHAB POTENTIAL: Fair given diagnosis  EVALUATION COMPLEXITY: Low   PLAN:  OT FREQUENCY: 2x/week  OT DURATION: 12 weeks  PLANNED INTERVENTIONS: self care/ADL training, therapeutic exercise, therapeutic activity, neuromuscular re-education, manual therapy, passive range of motion, balance training, functional mobility training, splinting, electrical stimulation, ultrasound, paraffin, fluidotherapy, moist heat, patient/family education, cognitive remediation/compensation, visual/perceptual remediation/compensation, energy conservation, coping  strategies training, DME and/or AE instructions, and Re-evaluation  RECOMMENDED OTHER SERVICES: none at this time  CONSULTED AND AGREED WITH PLAN OF CARE: Patient  PLAN FOR NEXT SESSION: work on fastening buttons, reinforce handwriting strategies, assess PPT #2 and PPT #4, incorporate simple cognitive task with physical task   Hans Eden, OT 03/17/2022, 11:07 AM

## 2022-03-19 ENCOUNTER — Encounter: Payer: Self-pay | Admitting: Occupational Therapy

## 2022-03-19 ENCOUNTER — Ambulatory Visit: Payer: Medicare HMO | Admitting: Occupational Therapy

## 2022-03-19 DIAGNOSIS — R278 Other lack of coordination: Secondary | ICD-10-CM

## 2022-03-19 DIAGNOSIS — R2681 Unsteadiness on feet: Secondary | ICD-10-CM

## 2022-03-19 DIAGNOSIS — R29818 Other symptoms and signs involving the nervous system: Secondary | ICD-10-CM | POA: Diagnosis not present

## 2022-03-19 DIAGNOSIS — R29898 Other symptoms and signs involving the musculoskeletal system: Secondary | ICD-10-CM

## 2022-03-19 NOTE — Therapy (Signed)
OUTPATIENT OCCUPATIONAL THERAPY [facility]MENT  Patient Name: Joshua [name] MRN: [id number] DOB:December 10, 1945, 77 y.o., male Today's Date:   PCP: Ginger Organ., MD  REFERRING PROVIDER: Ludwig Clarks, DO   OT End of Session - 03/19/22 1055     Visit Number 8    Number of Visits 25    Date for OT Re-Evaluation 05/01/22    Authorization Type [employer] Medicare    Progress Note Due on Visit 10    OT Start Time 1055    OT Stop Time 1140    OT Time Calculation (min) 45 min    Activity Tolerance Patient tolerated treatment well    Behavior During Therapy [facility] for tasks assessed/performed              Past Medical History:  Diagnosis Date   Allergic rhinitis    Allergy    Anal fissure    Anxiety    from chronic pain from surgery- on Cymbalta   Arthritis    Asthma    Barrett's esophagus 03/29/2014   Carotid artery disease ([place])    Carotid Doppler normal August, 2007   Cataract    Chest pain    Coronaries normal 1996 /  nuclear..06/2002..normal...EF  56% /  stress echo.. May, 2011.... no  scar or ischemia... rate related RBBB   Diverticulosis    Dyslipidemia    Ejection fraction    EF 60%, stress echo, May, 2011   GERD (gastroesophageal reflux disease)    Headache    History of loop recorder    has since 04/04/15   HTN (hypertension)    takes Metoprolol for PVC control   Hx of colonic polyps    adenomatous   Hx of colonoscopy    Hyperlipidemia    IFG (impaired fasting glucose)    Palpitations    Benign PVCs   Parkinson disease    Prostate cancer (HCC)    RBBB (right bundle branch block)    rate related   Shingles    Stroke [facility])    TIA   TIA (transient ischemic attack) 02/2015   Per pt, had 2 strokes   Tremor    Hand tremor   Vertigo    Past Surgical History:  Procedure Laterality Date   BACK SURGERY  2002,2009   x 6   CHOLECYSTECTOMY  11/30/2012   with IOC   COLONOSCOPY     ELECTROPHYSIOLOGIC STUDY N/A 09/26/2015   Procedure: V  Tach Ablation (PVC);  Surgeon: Will Meredith Leeds, MD;  Location: [place] [facility];  Service: Cardiovascular;  Laterality: N/A;   EP IMPLANTABLE DEVICE N/A 04/04/2015   Procedure: Loop Recorder Insertion;  Surgeon: Will Meredith Leeds, MD;  Location: [place] [facility];  Service: Cardiovascular;  Laterality: N/A;   HERNIA REPAIR     laprascopic   KNEE SURGERY     DECEMBER 2017,LEFT KNEE SCOPED   LEFT HEART CATH AND CORONARY ANGIOGRAPHY N/A 02/05/2021   Procedure: LEFT HEART CATH AND CORONARY ANGIOGRAPHY;  Surgeon: Wellington Hampshire, MD;  Location: [place] [facility];  Service: Cardiovascular;  Laterality: N/A;   NECK SURGERY  2002   POLYPECTOMY     ROTATOR CUFF REPAIR Left    TEE WITHOUT CARDIOVERSION N/A 04/04/2015   Procedure: TRANSESOPHAGEAL ECHOCARDIOGRAM (TEE);  Surgeon: Lelon Perla, MD;  Location: [facility] ENDOSCOPY;  Service: Cardiovascular;  Laterality: N/A;   TRIGGER FINGER RELEASE Left 12/28/2019   Procedure: RELEASE TRIGGER FINGER/A-1 PULLEY THUMB, MIDDLE AND RING;  Surgeon: Daryll Brod, MD;  Location: [place];  Service: Orthopedics;  Laterality: Left;  FAB   UPPER GASTROINTESTINAL ENDOSCOPY     V Tach ablation  09/26/2015   Patient Active Problem List   Diagnosis Date Noted   Coronary artery disease involving native coronary artery of native heart without angina pectoris 05/28/2021   History of myocardial infarction 02/05/2021   Parkinson's disease    Malignant neoplasm of prostate ([place]) 11/01/2017   Essential tremor 12/06/2015   PVC (premature ventricular contraction) [id number]   Embolic stroke involving left middle cerebral artery ([place]) 04/07/2015   Acute CVA (cerebrovascular accident) ([place]) 03/06/2015   Stroke (cerebrum) ([place]) 03/05/2015   CVA (cerebral infarction) 03/05/2015   Weakness of right upper extremity    Tremor    Ejection fraction    Vertigo    Sleep apnea    Palpitations    Hyperlipidemia with target LDL less than 70    GERD  (gastroesophageal reflux disease)    HTN (hypertension)    Chest pain    RBBB (right bundle branch block)    Ejection fraction    Carotid artery disease ([place])     ONSET DATE: 11/18/2021   REFERRING DIAG: G20.A1 (ICD-10-CM) - Parkinson's disease, unspecified whether dyskinesia present, unspecified whether manifestations fluctuate R29.898 (ICD-10-CM) - Rigidity  THERAPY DIAG:   THERAPY DIAG:  Other symptoms and signs involving the nervous system  Other symptoms and signs involving the musculoskeletal system  Other lack of coordination  Unsteadiness on feet  Rationale for Evaluation and Treatment: Rehabilitation  SUBJECTIVE:   SUBJECTIVE STATEMENT: We are taking care of our elderly neighbor now  Pt accompanied by: self  PERTINENT HISTORY: PMH: PD, Anxiety, HTN, HLD, multiple back surgeries/fusion neck and lumbar spine,  NSTEMI, 01/2020, hx of multiple syncopal episodes   PRECAUTIONS: Fall  WEIGHT BEARING RESTRICTIONS: No  PAIN:  Are you having pain? Yes: NPRS scale: 8/10 Pain location: Rt knee; R wrist (6/10) Pain description: aggravated  Aggravating factors: cold weather Relieving factors: rest, stretching, heat  FALLS: Has patient fallen in last 6 months? Yes. Number of falls 1  LIVING ENVIRONMENT: Lives with: lives with their spouse Lives in: House/apartment - [place]: No Has following equipment at home: Single point cane, Environmental consultant - 4 wheeled, Grab bars, and transport chair Has a lift chair and a lift bed.   PLOF: Independent; Pension scheme manager  PATIENT GOALS: reduce affects of tremor in RUE  OBJECTIVE:   HAND DOMINANCE: Right  ADLs: Overall ADLs: mod I Eating: setup Equipment: Shower seat with back, Grab bars, Walk in shower, Reacher, Sock aid, Long handled shoe horn, and Long handled sponge  IADLs: Shopping: able to do light shopping with shopping cart Light housekeeping: cleans bathrooms minus the tubs and uses light weight vacuum  cleaners Meal Prep: Mod I Community mobility: driving Medication management: mod I Financial management: 50/50 with wife Handwriting: 25% legible  MOBILITY STATUS: Needs Assist: uses SPC and Hx of falls  ACTIVITY TOLERANCE: Activity tolerance: fair  FUNCTIONAL OUTCOME MEASURES: To be assessed during future visits  UPPER EXTREMITY ROM:     AROM Right (eval) Left (eval)  Shoulder flexion WNL WNL  Shoulder abduction WNL WNL  Elbow flexion WNL WNL  Elbow extension WNL WNL  Wrist flexion 24 WNL  Wrist extension 15 WNL  Wrist pronation WFL WNL  Wrist supination WFL WNL   Digit Composite Flexion WNL WNL  Digit Composite Extension WNL WNL  Digit Opposition WNL WNL  (  Blank rows = not tested)  UPPER EXTREMITY MMT:     MMT Right (eval) Left (eval)  Shoulder flexion [facility] WNL  Shoulder abduction WFL WNL  Elbow flexion WFL WNL  Elbow extension WFL WNL  (Blank rows = not tested)  HAND FUNCTION: Grip strength: Right: 31.3 lbs; Left: 83.7 lbs  COORDINATION: 9 Hole Peg test: Right: 34.91 sec; Left: 48.99 sec Box and Blocks:  Right 34 blocks, Left 41blocks Tremors: Right, Left, and right worse than left  SENSATION: Mild, occasional paresthesias in R hand and fingers  EDEMA: mild arthritic changes to R wrist  MUSCLE TONE: RUE: Rigidity; mild  COGNITION: Overall cognitive status: Within functional limits for tasks assessed  VISION: Subjective report: Just got new glasses RX; limited changes Baseline vision: Wears glasses all the time; progressive lenses Visual history: n/a  OBSERVATIONS: Bradykinesia   TODAY'S TREATMENT:   Practiced buttons with cues to line up button with button hole then push hard through. Then pull button and cloth apart and push through to unbutton. Pt did well (when shirt on table)  Practiced writing in print w/ forearm supported on table to reduce tremors - pt writes better with pencil d/t tremors Pt issued strategies to reduce tremors and  community and online resources for PD Assessed PPT #2 and PPT #4 with improvements in both - see STG's Standing to flip cards over with PWR! Step back (bilaterally) and adding last 2 cards for cognitive component - min cues for big step and big hands UBE x 5 mintues, level 3 for UE strength/endurance, keeping RPM > 40   PATIENT EDUCATION: Education details:Community and online PD resources, strategies for tremors Person educated: Patient Education method: Explanation and Handouts Education comprehension: verbalized understanding   HOME EXERCISE PROGRAM: 2/13: coordination HEP, PWR! Hands 03/03/22: PWR! Seated, handwriting strategies 2/22: BIG MOVEMENT strategies with ADLs 03/17/22: medicine/meal schedule, ways to prevent future related PD complications [id number]: community and online resources for March, strategies for tremors  GOALS:  SHORT TERM GOALS: Target date: 03/03/2022    Pt will be independent with PD specific HEP.  Baseline: Goal status: MET  2.  Pt will verbalize understanding of adapted strategies to maximize safety and I with ADLs/IADLs. Baseline:  Goal status:MET  3.  Pt will verbalize understanding of ways to prevent future PD related complications and PD community resources.  Baseline:  Goal status: MET  4.  Pt will demonstrate improved ease with self feeding as evidenced by reducing PPT#2 by 3 secs Baseline: 16.28- 02/24/22 Goal status: MET (12.75 sec)   5. Pt will demonstrate increased ease with dressing as evidenced decreasing PPT#4 by 3 secs   Baseline:16.34-2/13/24 Goal status: MET (10.82 sec)    LONG TERM GOALS: Target date: 05/01/2022  Pt will write short paragraph with 100% legibility and no significant decrease in letter size. Baseline: 25% legible Goal status: INITIAL  2.  Patient will complete nine-hole peg with use of L in 40 seconds or less. Baseline: 48 seconds  Goal status: INITIAL  3.  Patient will demonstrate at least 41 lbs R grip  strength as needed to open jars and other containers. Baseline: 31 lbs Goal status: INITIAL   ASSESSMENT:  CLINICAL IMPRESSION: Pt has met all STG's. Pt with improvements in all areas of ADLS and functional tasks.   PERFORMANCE DEFICITS: in functional skills including ADLs, IADLs, coordination, strength, Fine motor control, Gross motor control, mobility, balance, and UE functional use.  IMPAIRMENTS: are limiting patient from ADLs, IADLs, and leisure.  COMORBIDITIES:  may have co-morbidities  that affects occupational performance. Patient will benefit from skilled OT to address above impairments and improve overall function.  MODIFICATION OR ASSISTANCE TO COMPLETE EVALUATION: Min-Moderate modification of tasks or assist with assess necessary to complete an evaluation.  OT OCCUPATIONAL PROFILE AND HISTORY: Problem focused assessment: Including review of records relating to presenting problem.  CLINICAL DECISION MAKING: LOW - limited treatment options, no task modification necessary  REHAB POTENTIAL: Fair given diagnosis  EVALUATION COMPLEXITY: Low   PLAN:  OT FREQUENCY: 2x/week  OT DURATION: 12 weeks  PLANNED INTERVENTIONS: self care/ADL training, therapeutic exercise, therapeutic activity, neuromuscular re-education, manual therapy, passive range of motion, balance training, functional mobility training, splinting, electrical stimulation, ultrasound, paraffin, fluidotherapy, moist heat, patient/family education, cognitive remediation/compensation, visual/perceptual remediation/compensation, energy conservation, coping strategies training, DME and/or AE instructions, and Re-evaluation  RECOMMENDED OTHER SERVICES: none at this time  CONSULTED AND AGREED WITH PLAN OF CARE: Patient  PLAN FOR NEXT SESSION: consider [employer] for movement and cognitive component, UBE, coordination act in standing   Hans Eden, OT 03/19/2022, 10:55 AM

## 2022-03-19 NOTE — Patient Instructions (Signed)
Compensation Strategies for Tremors  When eating, try the following Eat out of bowls, divided plates, or use a plate guard (available at a medical supply store) and eat with a spoon so that you have an edge to scoop up food. Try raising your plate so that there is less distance between the plate and mouth.Try stabilizing elbows on the tables or against your body. Use utensil with built-up/larger grips as they are easier to hold.  When writing, try the following: Stabilize forearm on the table. Take your time as rushing/being stressed can increase tremors. Try a felt-tipped pen, it does not glide as much.  Avoid gel pens ( they move to much ). Consider using pre-printed labels with your name and address (carry them with you when you go out) or you can get stamps with your address or signature on it. Use a small tape recorder to record messages/reminders for yourself. Use pens with bigger grips.  When brushing your teeth, putting on make-up, or styling hair, try the following: Use an electric toothbrush. Use items with built-up grips. Stabilize your elbows against your body or on the counter. Use long-handled brushes/combs. Use a hair dryer with a stand.  In general: Avoid stress, fatigue or rushing as this can increase tremors. Sit down for activities that require more control/coordination. Perform "flicks".

## 2022-03-24 ENCOUNTER — Ambulatory Visit: Payer: Medicare HMO | Admitting: Occupational Therapy

## 2022-03-26 ENCOUNTER — Ambulatory Visit: Payer: Medicare HMO | Admitting: Occupational Therapy

## 2022-03-31 ENCOUNTER — Ambulatory Visit: Payer: Medicare HMO | Admitting: Occupational Therapy

## 2022-04-02 ENCOUNTER — Encounter: Payer: Medicare HMO | Admitting: Occupational Therapy

## 2022-04-08 ENCOUNTER — Ambulatory Visit (INDEPENDENT_AMBULATORY_CARE_PROVIDER_SITE_OTHER): Payer: Medicare HMO | Admitting: Internal Medicine

## 2022-04-08 ENCOUNTER — Encounter: Payer: Self-pay | Admitting: Internal Medicine

## 2022-04-08 ENCOUNTER — Telehealth: Payer: Self-pay

## 2022-04-08 VITALS — BP 102/70 | HR 88 | Ht 73.0 in | Wt 223.4 lb

## 2022-04-08 DIAGNOSIS — Z86010 Personal history of colonic polyps: Secondary | ICD-10-CM

## 2022-04-08 DIAGNOSIS — K219 Gastro-esophageal reflux disease without esophagitis: Secondary | ICD-10-CM

## 2022-04-08 DIAGNOSIS — Z7902 Long term (current) use of antithrombotics/antiplatelets: Secondary | ICD-10-CM | POA: Diagnosis not present

## 2022-04-08 DIAGNOSIS — K2270 Barrett's esophagus without dysplasia: Secondary | ICD-10-CM | POA: Diagnosis not present

## 2022-04-08 DIAGNOSIS — K5903 Drug induced constipation: Secondary | ICD-10-CM

## 2022-04-08 DIAGNOSIS — K649 Unspecified hemorrhoids: Secondary | ICD-10-CM

## 2022-04-08 MED ORDER — PLENVU 140 G PO SOLR: 1.0000 | Freq: Once | ORAL | 0 refills | Status: AC

## 2022-04-08 NOTE — Telephone Encounter (Signed)
**Note De-Identified Joshua [name] Obfuscation** Joshua [name] ([name]) PA Case ID #[id number] Outcome: Approved today Your request has been approved Authorization Expiration Date: 01/12/2023 Drug Mexiletine HCl 150MG  capsules ePA cloud logo Form [employer] Electronic PA Form [phone] NCPDP)  I have notified [id number] - [place], [place] (Ph: [phone]) of this approval.

## 2022-04-08 NOTE — Patient Instructions (Signed)
_______________________________________________________  If your blood pressure at your visit was 140/90 or greater, please contact your primary care physician to follow up on this.  _______________________________________________________  If you are age 76 or older, your body mass index should be between 23-30. Your Body mass index is 29.47 kg/m. If this is out of the aforementioned range listed, please consider follow up with your Primary Care Provider.  If you are age 72 or younger, your body mass index should be between 19-25. Your Body mass index is 29.47 kg/m. If this is out of the aformentioned range listed, please consider follow up with your Primary Care Provider.   ________________________________________________________  The [facility] providers would like to encourage you to use [facility] to communicate with providers for non-urgent requests or questions.  Due to long hold times on the telephone, sending your provider a message by [facility] may be a faster and more efficient way to get a response.  Please allow 48 business hours for a response.  Please remember that this is for non-urgent requests.    You have been scheduled for an endoscopy and colonoscopy. Please follow the written instructions given to you at your visit today. Please pick up your prep supplies at the pharmacy within the next 1-3 days. If you use inhalers (even only as needed), please bring them with you on the day of your procedure.

## 2022-04-08 NOTE — Progress Notes (Signed)
HISTORY OF PRESENT ILLNESS:  Joshua [name] is a 77 y.o. male with multiple significant medical problems including coronary artery disease for which he is on chronic Plavix and aspirin therapy.  The patient has a history of Barrett's esophagus with high-grade dysplasia for which she has undergone endoscopic ablation and is being followed at [facility].  No evidence of recurrence.  No evidence of cancer.  He is maintained on lansoprazole 30 mg twice daily.  He presents today regarding surveillance colonoscopy.  He has a history of multiple adenomas and advanced adenomas.  Previous examinations 2006, 2009, 2014, 2017.  At the time of his last examination he was found to have 1 diminutive adenoma.  Also left-sided diverticulosis and hemorrhoids.  Patient was diagnosed with Parkinson's disease.  He is on Sinemet.  Since that time he has had more difficulty with his bowels.  He continues with constipation.  He uses MiraLAX as needed.  Sometimes this resulted.  Loose bowels.  He has tried Citrucel on occasion.  This seems to help.  No bleeding.  However, he does complain of occasional prolapsing hemorrhoids.  Wonders about banding procedure.  The patient had a myocardial infarction late January 2023.  He underwent cardiac catheterization.  He did not require intervention.  Aspirin was added to his Plavix.  He had up-to-date cardiology office follow-up.  He is doing well from that perspective.  He has come off his Plavix in the past for procedures.  This has been okayed by Dr. Brigitte Pulse.  Review of blood work from February 14, 2021 shows normal creatinine.  Normal CBC with hemoglobin 15.3. CT angiography of the chest, abdomen, and pelvis performed February 05, 2021 revealed no significant abdominal abnormalities.  Incidental diverticulosis and prior cholecystectomy noted.  Continues with some progression of his Parkinson's disease.  He is interested in his surveillance colonoscopy at this time, which was postponed  last year after recently having had the MRI as noted above.  REVIEW OF SYSTEMS:  All non-GI ROS negative listed in the HPI except for arthritis  Past Medical History:  Diagnosis Date   Allergic rhinitis    Allergy    Anal fissure    Anxiety    from chronic pain from surgery- on Cymbalta   Arthritis    Asthma    Barrett's esophagus 03/29/2014   Carotid artery disease ([place])    Carotid Doppler normal August, 2007   Cataract    Chest pain    Coronaries normal 1996 /  nuclear..06/2002..normal...EF  56% /  stress echo.. May, 2011.... no  scar or ischemia... rate related RBBB   Diverticulosis    Dyslipidemia    Ejection fraction    EF 60%, stress echo, May, 2011   GERD (gastroesophageal reflux disease)    Headache    History of loop recorder    has since 04/04/15   HTN (hypertension)    takes Metoprolol for PVC control   Hx of colonic polyps    adenomatous   Hx of colonoscopy    Hyperlipidemia    IFG (impaired fasting glucose)    Palpitations    Benign PVCs   Parkinson disease    Prostate cancer (HCC)    RBBB (right bundle branch block)    rate related   Shingles    Stroke ([place])    TIA   TIA (transient ischemic attack) 02/2015   Per pt, had 2 strokes   Tremor    Hand tremor   Vertigo  Past Surgical History:  Procedure Laterality Date   BACK SURGERY  2002,2009   x 6   CHOLECYSTECTOMY  11/30/2012   with IOC   COLONOSCOPY     ELECTROPHYSIOLOGIC STUDY N/A 09/26/2015   Procedure: V Tach Ablation (PVC);  Surgeon: Will Meredith Leeds, MD;  Location: [facility];  Service: Cardiovascular;  Laterality: N/A;   EP IMPLANTABLE DEVICE N/A 04/04/2015   Procedure: Loop Recorder Insertion;  Surgeon: Will Meredith Leeds, MD;  Location: [facility];  Service: Cardiovascular;  Laterality: N/A;   HERNIA REPAIR     laprascopic   KNEE SURGERY     DECEMBER 2017,LEFT KNEE SCOPED   LEFT HEART CATH AND CORONARY ANGIOGRAPHY N/A 02/05/2021   Procedure: LEFT HEART CATH  AND CORONARY ANGIOGRAPHY;  Surgeon: Wellington Hampshire, MD;  Location: [facility];  Service: Cardiovascular;  Laterality: N/A;   NECK SURGERY  2002   POLYPECTOMY     ROTATOR CUFF REPAIR Left    TEE WITHOUT CARDIOVERSION N/A 04/04/2015   Procedure: TRANSESOPHAGEAL ECHOCARDIOGRAM (TEE);  Surgeon: Lelon Perla, MD;  Location: [facility];  Service: Cardiovascular;  Laterality: N/A;   TRIGGER FINGER RELEASE Left 12/28/2019   Procedure: RELEASE TRIGGER FINGER/A-1 PULLEY THUMB, MIDDLE AND RING;  Surgeon: Daryll Brod, MD;  Location: [facility];  Service: Orthopedics;  Laterality: Left;  FAB   UPPER GASTROINTESTINAL ENDOSCOPY     V Tach ablation  09/26/2015    Social History Joshua [name]  reports that he has never smoked. He has never used smokeless tobacco. He reports that he does not drink alcohol and does not use drugs.  family history includes Breast cancer in his paternal grandmother; Clotting disorder in his father; Healthy in his daughter and daughter; Heart attack in his father and paternal grandfather; Heart disease in his brother and father; Hypertension in his mother; Lung cancer in his brother; Prostate cancer in his brother.  Allergies  Allergen Reactions   Floxin I.V. In Dextrose 5% [Ofloxacin] Other (See Comments)    Lowers BP Lowers BP   Terazosin Other (See Comments)    Lower bp       PHYSICAL EXAMINATION: Vital signs: BP 102/70   Pulse 88   Ht 6\' 1"  (1.854 m)   Wt 223 lb 6.4 oz (101.3 kg)   BMI 29.47 kg/m   Constitutional: generally well-appearing, no acute distress Psychiatric: alert and oriented x3, cooperative Eyes: extraocular movements intact, anicteric, conjunctiva pink Mouth: Mask Neck: supple no lymphadenopathy Cardiovascular: heart regular rate and rhythm, no murmur Lungs: clear to auscultation bilaterally Abdomen: soft, nontender, nondistended, no obvious ascites, no peritoneal signs, normal bowel sounds, no  organomegaly Rectal: Deferred till colonoscopy Extremities: no clubbing, cyanosis, or lower extremity edema bilaterally Skin: no lesions on visible extremities Neuro: No gross focal deficits.  Parkinsonian tremor noted  ASSESSMENT:  1.  Personal history of multiple advanced adenomatous colon polyps.  Last examination 2017.  Due for surveillance 2.  Chronic constipation.  Drug-induced.  Ongoing 3.  Occasional prolapsing hemorrhoids. 4.  History of Barrett's esophagus with high-grade dysplasia.  Status post endoscopic ablation at [facility].  Multiple surveillance endoscopies in [facility] thereafter.  No recurrence.  He was told by his GI team there, to follow-up here for his next surveillance endoscopy 5.  Multiple medical problems including remote history of CVA on Plavix and coronary artery disease on aspirin.  Parkinson's.   PLAN:  1.  Surveillance colonoscopy.  The patient is  high risk given his comorbidities and the need to address Plavix. 2.  Upper endoscopy surveillance biopsies.  The patient is high risk as above. 3.  Continue MiraLAX as needed.  Recommended using lower regular dosages.  Also to add Citrucel. 4.  Evaluate hemorrhoids.  May be candidate for banding. 5.  Hold Plavix 5 to 7 days prior to his procedures.  Patient understands the risks and benefits of interrupting Plavix therapy.  However, he will REMAIN ON ASPIRIN throughout..  Again, he understands.  Would anticipate resumption of Plavix immediately post procedure. 6.  Ongoing general medical care with Dr. Brigitte Pulse A total time of 40 minutes was spent preparing to see the patient, obtaining interval history, performing medically appropriate physical examination, ordering multiple advanced procedures, directing medical therapy, counseling and educating the patient regarding the above listed issues, and documenting clinical information in the health record

## 2022-04-13 ENCOUNTER — Encounter: Payer: Self-pay | Admitting: Internal Medicine

## 2022-04-22 ENCOUNTER — Encounter: Payer: Self-pay | Admitting: Internal Medicine

## 2022-04-22 ENCOUNTER — Ambulatory Visit (AMBULATORY_SURGERY_CENTER): Payer: Medicare HMO | Admitting: Internal Medicine

## 2022-04-22 VITALS — BP 138/76 | HR 79 | Temp 98.4°F | Resp 10

## 2022-04-22 DIAGNOSIS — Z86010 Personal history of colonic polyps: Secondary | ICD-10-CM | POA: Diagnosis not present

## 2022-04-22 DIAGNOSIS — D124 Benign neoplasm of descending colon: Secondary | ICD-10-CM | POA: Diagnosis not present

## 2022-04-22 DIAGNOSIS — K2270 Barrett's esophagus without dysplasia: Secondary | ICD-10-CM

## 2022-04-22 DIAGNOSIS — Z8719 Personal history of other diseases of the digestive system: Secondary | ICD-10-CM

## 2022-04-22 DIAGNOSIS — K635 Polyp of colon: Secondary | ICD-10-CM

## 2022-04-22 DIAGNOSIS — D123 Benign neoplasm of transverse colon: Secondary | ICD-10-CM

## 2022-04-22 DIAGNOSIS — K649 Unspecified hemorrhoids: Secondary | ICD-10-CM

## 2022-04-22 DIAGNOSIS — Z09 Encounter for follow-up examination after completed treatment for conditions other than malignant neoplasm: Secondary | ICD-10-CM | POA: Diagnosis present

## 2022-04-22 DIAGNOSIS — K219 Gastro-esophageal reflux disease without esophagitis: Secondary | ICD-10-CM

## 2022-04-22 DIAGNOSIS — D122 Benign neoplasm of ascending colon: Secondary | ICD-10-CM

## 2022-04-22 DIAGNOSIS — K317 Polyp of stomach and duodenum: Secondary | ICD-10-CM

## 2022-04-22 MED ORDER — SODIUM CHLORIDE 0.9 % IV SOLN: 500.0000 mL | Freq: Once | INTRAVENOUS | Status: DC

## 2022-04-22 NOTE — Patient Instructions (Addendum)
You may resume all of your current medications today including your Plavix.  You will need a repeat Endoscopy and Colonoscopy in 3 years.  I did include a handout regarding a hemorrhoidal banding.  YOU HAD AN ENDOSCOPIC PROCEDURE TODAY AT THE [facility]:   Refer to the procedure report that was given to you for any specific questions about what was found during the examination.  If the procedure report does not answer your questions, please call your gastroenterologist to clarify.  If you requested that your care partner not be given the details of your procedure findings, then the procedure report has been included in a sealed envelope for you to review at your convenience later.  YOU SHOULD EXPECT: Some feelings of bloating in the abdomen. Passage of more gas than usual.  Walking can help get rid of the air that was put into your GI tract during the procedure and reduce the bloating. If you had a lower endoscopy (such as a colonoscopy or flexible sigmoidoscopy) you may notice spotting of blood in your stool or on the toilet paper. If you underwent a bowel prep for your procedure, you may not have a normal bowel movement for a few days.  Please Note:  You might notice some irritation and congestion in your nose or some drainage.  This is from the oxygen used during your procedure.  There is no need for concern and it should clear up in a day or so.  SYMPTOMS TO REPORT IMMEDIATELY:  Following lower endoscopy (colonoscopy or flexible sigmoidoscopy):  Excessive amounts of blood in the stool  Significant tenderness or worsening of abdominal pains  Swelling of the abdomen that is new, acute  Fever of 100F or higher  Following upper endoscopy (EGD)  Vomiting of blood or coffee ground material  New chest pain or pain under the shoulder blades  Painful or persistently difficult swallowing  New shortness of breath  Fever of 100F or higher  Black, tarry-looking stools  For urgent or  emergent issues, a gastroenterologist can be reached at any hour by calling [phone]. Do not use MyChart messaging for urgent concerns.    DIET:  We do recommend a small meal at first, but then you may proceed to your regular diet.  Drink plenty of fluids but you should avoid alcoholic beverages for 24 hours.  Try to eat a high fiber diet, and drink plenty of water.  ACTIVITY:  You should plan to take it easy for the rest of today and you should NOT DRIVE or use heavy machinery until tomorrow (because of the sedation medicines used during the test).    FOLLOW UP: Our staff will call the number listed on your records the next business day following your procedure.  We will call around 7:15- 8:00 am to check on you and address any questions or concerns that you may have regarding the information given to you following your procedure. If we do not reach you, we will leave a message.     If any biopsies were taken you will be contacted by phone or by letter within the next 1-3 weeks.  Please call us at [phone] if you have not heard about the biopsies in 3 weeks.    SIGNATURES/CONFIDENTIALITY: You and/or your care partner have signed paperwork which will be entered into your electronic medical record.  These signatures attest to the fact that that the information above on your After Visit Summary has been reviewed and is  understood.  Full responsibility of the confidentiality of this discharge information lies with you and/or your care-partner. 

## 2022-04-22 NOTE — Op Note (Signed)
[facility] Patient Name: Joshua [name] Procedure Date: 04/22/2022 1:42 PM MRN: [id number] Endoscopist: Wilhemina Bonito. Marina Goodell , MD, [id number] Age: 77 Referring MD:  Date of Birth: September 22, 1945 Gender: Male Account #: [id number] Procedure:                Colonoscopy with cold snare polypectomy x 5; biopsy                            polypectomy x 1 Indications:              High risk colon cancer surveillance: Personal                            history of multiple (3 or more) adenomas. Previous                            examinations 2006, 2009, 2014, 2017 Medicines:                Monitored Anesthesia Care Procedure:                Pre-Anesthesia Assessment:                           - Prior to the procedure, a History and Physical                            was performed, and patient medications and                            allergies were reviewed. The patient's tolerance of                            previous anesthesia was also reviewed. The risks                            and benefits of the procedure and the sedation                            options and risks were discussed with the patient.                            All questions were answered, and informed consent                            was obtained. Prior Anticoagulants: The patient has                            taken Plavix (clopidogrel), last dose was 7 days                            prior to procedure. ASA Grade Assessment: III - A                            patient with severe systemic disease. After  reviewing the risks and benefits, the patient was                            deemed in satisfactory condition to undergo the                            procedure.                           After obtaining informed consent, the colonoscope                            was passed under direct vision. Throughout the                            procedure, the patient's blood pressure, pulse,  and                            oxygen saturations were monitored continuously. The                            Olympus SN [id number] was introduced through the anus                            and advanced to the the cecum, identified by                            appendiceal orifice and ileocecal valve. The                            ileocecal valve, appendiceal orifice, and rectum                            were photographed. The quality of the bowel                            preparation was excellent. The colonoscopy was                            performed without difficulty. The patient tolerated                            the procedure well. The bowel preparation used was                            SUPREP via split dose instruction. Scope In: 1:56:37 PM Scope Out: 2:18:43 PM Scope Withdrawal Time: 0 hours 18 minutes 3 seconds  Total Procedure Duration: 0 hours 22 minutes 6 seconds  Findings:                 Five polyps were found in the descending colon,                            transverse colon and ascending colon. The polyps  were 2 to 6 mm in size. These polyps were removed                            with a cold snare. Resection and retrieval were                            complete.                           A 1 mm polyp was found in the descending colon. The                            polyp was removed with a jumbo cold forceps.                            Resection and retrieval were complete.                           Many diverticula were found in the left colon and                            right colon.                           Internal hemorrhoids were found during                            retroflexion. The hemorrhoids were moderate.                           The exam was otherwise without abnormality on                            direct and retroflexion views. Complications:            No immediate complications. Estimated blood loss:                             None. Estimated Blood Loss:     Estimated blood loss: none. Impression:               - Five 2 to 6 mm polyps in the descending colon, in                            the transverse colon and in the ascending colon,                            removed with a cold snare. Resected and retrieved.                           - One 1 mm polyp in the descending colon, removed                            with a jumbo cold forceps. Resected and retrieved.                           -  Diverticulosis in the left colon and in the right                            colon.                           - Internal hemorrhoids.                           - The examination was otherwise normal on direct                            and retroflexion views. Recommendation:           - Repeat colonoscopy in 3 years for surveillance.                           - Resume Plavix (clopidogrel) today at prior dose.                           - Patient has a contact number available for                            emergencies. The signs and symptoms of potential                            delayed complications were discussed with the                            patient. Return to normal activities tomorrow.                            Written discharge instructions were provided to the                            patient.                           - Resume previous diet.                           - Continue present medications.                           - Await pathology results. Wilhemina Bonito. Marina Goodell, MD 04/22/2022 2:28:02 PM This report has been signed electronically.

## 2022-04-22 NOTE — Op Note (Signed)
[facility] Patient Name: Marshel Rolnick Procedure Date: 04/22/2022 1:36 PM MRN: [id number] Endoscopist: Wilhemina Bonito. Marina Goodell , MD, [id number] Age: 77 Referring MD:  Date of Birth: 1945-08-01 Gender: Male Account #: [id number] Procedure:                Upper GI endoscopy with biopsies Indications:              Follow-up of previous ablation treatment of                            Barrett's esophagus. Patient had short segment (2.5                            cm) of Barrett's esophagus remotely with high-grade                            dysplasia which was subsequently ablated at [facility].                            Has had multiple follow-up exams at [facility]. No                            evidence of recurrence. Now for surveillance EGD                            with biopsies Medicines:                Monitored Anesthesia Care Procedure:                Pre-Anesthesia Assessment:                           - Prior to the procedure, a History and Physical                            was performed, and patient medications and                            allergies were reviewed. The patient's tolerance of                            previous anesthesia was also reviewed. The risks                            and benefits of the procedure and the sedation                            options and risks were discussed with the patient.                            All questions were answered, and informed consent                            was obtained. Prior Anticoagulants: The patient has  taken Plavix (clopidogrel), last dose was 7 days                            prior to procedure. ASA Grade Assessment: III - A                            patient with severe systemic disease. After                            reviewing the risks and benefits, the patient was                            deemed in satisfactory condition to undergo the                            procedure.                            After obtaining informed consent, the endoscope was                            passed under direct vision. Throughout the                            procedure, the patient's blood pressure, pulse, and                            oxygen saturations were monitored continuously. The                            GIF [id number] #[id number] was introduced through the                            mouth, and advanced to the second part of duodenum.                            The upper GI endoscopy was accomplished without                            difficulty. The patient tolerated the procedure                            well. Scope In: Scope Out: Findings:                 The esophagus was somewhat dilated. The esophageal                            mucosa was normal throughout including the distal                            portion. No obvious abnormalities on white light or                            NBI. Biopsies of  the distal 3 cm were taken to rule                            out buried glands.                           The stomach revealed a hiatal hernia and multiple                            large and small fundic gland type polyps..                           The examined duodenum was normal.                           The cardia and gastric fundus were otherwise normal                            on retroflexion. Complications:            No immediate complications. Estimated Blood Loss:     Estimated blood loss: none. Impression:               1. No evidence of recurrent Barrett's status post                            biopsies                           2. Benign fundic gland polyps and hiatal hernia                           3. Otherwise normal EGD. Recommendation:           - Patient has a contact number available for                            emergencies. The signs and symptoms of potential                            delayed complications were discussed with the                             patient. Return to normal activities tomorrow.                            Written discharge instructions were provided to the                            patient.                           - Resume previous diet.                           - Continue present medications.                           -  Resume Plavix today                           -Repeat EGD with biopsies in 3 years                           - Await pathology results. Wilhemina BonitoJohn N. Marina GoodellPerry, MD 04/22/2022 2:42:11 PM This report has been signed electronically.

## 2022-04-22 NOTE — Progress Notes (Signed)
Called to room to assist during endoscopic procedure.  Patient ID and intended procedure confirmed with present staff. Received instructions for my participation in the procedure from the performing physician.  

## 2022-04-22 NOTE — Progress Notes (Signed)
Sedate, gd SR, tolerated procedure well, VSS, report to RN 

## 2022-04-22 NOTE — Progress Notes (Signed)
Pt's states no medical or surgical changes since previsit or office visit. 

## 2022-04-22 NOTE — Progress Notes (Signed)
HISTORY OF PRESENT ILLNESS:   Joshua [name] is a 77 y.o. male with multiple significant medical problems including coronary artery disease for which he is on chronic Plavix and aspirin therapy.  The patient has a history of Barrett's esophagus with high-grade dysplasia for which she has undergone endoscopic ablation and is being followed at [facility].  No evidence of recurrence.  No evidence of cancer.  He is maintained on lansoprazole 30 mg twice daily.  He presents today regarding surveillance colonoscopy.  He has a history of multiple adenomas and advanced adenomas.  Previous examinations 2006, 2009, 2014, 2017.  At the time of his last examination he was found to have 1 diminutive adenoma.  Also left-sided diverticulosis and hemorrhoids.  Patient was diagnosed with Parkinson's disease.  He is on Sinemet.  Since that time he has had more difficulty with his bowels.  He continues with constipation.  He uses MiraLAX as needed.  Sometimes this resulted.  Loose bowels.  He has tried Citrucel on occasion.  This seems to help.  No bleeding.  However, he does complain of occasional prolapsing hemorrhoids.  Wonders about banding procedure.  The patient had a myocardial infarction late January 2023.  He underwent cardiac catheterization.  He did not require intervention.  Aspirin was added to his Plavix.  He had up-to-date cardiology office follow-up.  He is doing well from that perspective.  He has come off his Plavix in the past for procedures.  This has been okayed by Dr. Clelia Croft.   Review of blood work from February 14, 2021 shows normal creatinine.  Normal CBC with hemoglobin 15.3. CT angiography of the chest, abdomen, and pelvis performed February 05, 2021 revealed no significant abdominal abnormalities.  Incidental diverticulosis and prior cholecystectomy noted.   Continues with some progression of his Parkinson's disease.  He is interested in his surveillance colonoscopy at this time, which was  postponed last year after recently having had the MRI as noted above.   REVIEW OF SYSTEMS:   All non-GI ROS negative listed in the HPI except for arthritis       Past Medical History:  Diagnosis Date   Allergic rhinitis     Allergy     Anal fissure     Anxiety      from chronic pain from surgery- on Cymbalta   Arthritis     Asthma     Barrett's esophagus 03/29/2014   Carotid artery disease (HCC)      Carotid Doppler normal August, 2007   Cataract     Chest pain      Coronaries normal 1996 /  nuclear..06/2002..normal...EF  56% /  stress echo.. May, 2011.... no  scar or ischemia... rate related RBBB   Diverticulosis     Dyslipidemia     Ejection fraction      EF 60%, stress echo, May, 2011   GERD (gastroesophageal reflux disease)     Headache     History of loop recorder      has since 04/04/15   HTN (hypertension)      takes Metoprolol for PVC control   Hx of colonic polyps      adenomatous   Hx of colonoscopy     Hyperlipidemia     IFG (impaired fasting glucose)     Palpitations      Benign PVCs   Parkinson disease     Prostate cancer (HCC)     RBBB (right bundle branch block)  rate related   Shingles     Stroke [facility])      TIA   TIA (transient ischemic attack) 02/2015    Per pt, had 2 strokes   Tremor      Hand tremor   Vertigo             Past Surgical History:  Procedure Laterality Date   BACK SURGERY   2002,2009    x 6   CHOLECYSTECTOMY   11/30/2012    with IOC   COLONOSCOPY       ELECTROPHYSIOLOGIC STUDY N/A 09/26/2015    Procedure: V Tach Ablation (PVC);  Surgeon: Will Jorja Loa, MD;  [facility]: [facility];  Service: Cardiovascular;  Laterality: N/A;   EP IMPLANTABLE DEVICE N/A 04/04/2015    Procedure: Loop Recorder Insertion;  Surgeon: Will Jorja Loa, MD;  Location: [facility];  Service: Cardiovascular;  Laterality: N/A;   HERNIA REPAIR        laprascopic   KNEE SURGERY        DECEMBER 2017,LEFT KNEE SCOPED   LEFT  HEART CATH AND CORONARY ANGIOGRAPHY N/A 02/05/2021    Procedure: LEFT HEART CATH AND CORONARY ANGIOGRAPHY;  Surgeon: Iran Ouch, MD;  Location: [facility];  Service: Cardiovascular;  Laterality: N/A;   NECK SURGERY   2002   POLYPECTOMY       ROTATOR CUFF REPAIR Left     TEE WITHOUT CARDIOVERSION N/A 04/04/2015    Procedure: TRANSESOPHAGEAL ECHOCARDIOGRAM (TEE);  Surgeon: Lewayne Bunting, MD;  Location: [facility] ENDOSCOPY;  Service: Cardiovascular;  Laterality: N/A;   TRIGGER FINGER RELEASE Left 12/28/2019    Procedure: RELEASE TRIGGER FINGER/A-1 PULLEY THUMB, MIDDLE AND RING;  Surgeon: Cindee Salt, MD;  Location: [facility];  Service: Orthopedics;  Laterality: Left;  FAB   UPPER GASTROINTESTINAL ENDOSCOPY       V Tach ablation   09/26/2015      Social History [place] Ken[place]  reports that he has never smoked. He has never used smokeless tobacco. He reports that he does not drink alcohol and does not use drugs.   family history includes Breast cancer in his paternal grandmother; Clotting disorder in his father; Healthy in his daughter and daughter; Heart attack in his father and paternal grandfather; Heart disease in his brother and father; Hypertension in his mother; Lung cancer in his brother; Prostate cancer in his brother.        Allergies  Allergen Reactions   Floxin I.V. In Dextrose 5% [Ofloxacin] Other (See Comments)      Lowers BP Lowers BP   Terazosin Other (See Comments)      Lower bp          PHYSICAL EXAMINATION: Vital signs: BP 102/70   Pulse 88   Ht  (1.854 m)   Wt 223 lb 6.4 oz (101.3 kg)   BMI 29.47 kg/m   Constitutional: generally well-appearing, no acute distress Psychiatric: alert and oriented x3, cooperative Eyes: extraocular movements intact, anicteric, conjunctiva pink Mouth: Mask Neck: supple no lymphadenopathy Cardiovascular: heart regular rate and rhythm, no murmur Lungs: clear to auscultation bilaterally Abdomen:  soft, nontender, nondistended, no obvious ascites, no peritoneal signs, normal bowel sounds, no organomegaly Rectal: Deferred till colonoscopy Extremities: no clubbing, cyanosis, or lower extremity edema bilaterally Skin: no lesions on visible extremities Neuro: No gross focal deficits.  Parkinsonian tremor noted   ASSESSMENT:   1.  Personal history of multiple advanced adenomatous colon  polyps.  Last examination 2017.  Due for surveillance 2.  Chronic constipation.  Drug-induced.  Ongoing 3.  Occasional prolapsing hemorrhoids. 4.  History of Barrett's esophagus with high-grade dysplasia.  [facility]  [facility].  No recurrence.  He was told by his GI team there, to follow-up here for his next surveillance endoscopy 5.  Multiple medical problems including remote history of CVA on Plavix and coronary artery disease on aspirin.  Parkinson's.     PLAN:   1.  Surveillance colonoscopy.  The patient is high risk given his comorbidities and the need to address Plavix. 2.  Upper endoscopy surveillance biopsies.  The patient is high risk as above. 3.  Continue MiraLAX as needed.  Recommended using lower regular dosages.  Also to add Citrucel. 4.  Evaluate hemorrhoids.  May be candidate for banding. 5.  Hold Plavix 5 to 7 days prior to his procedures.  Patient understands the risks and benefits of interrupting Plavix therapy.  However, he will REMAIN ON ASPIRIN throughout..  Again, he understands.  Would anticipate resumption of Plavix immediately post procedure. 6.  Ongoing general medical care with Dr. Clelia CroftShaw

## 2022-04-23 ENCOUNTER — Telehealth: Payer: Self-pay | Admitting: *Deleted

## 2022-04-23 NOTE — Telephone Encounter (Signed)
No answer on  follow up call. Left message.   

## 2022-04-27 ENCOUNTER — Ambulatory Visit: Payer: Medicare HMO | Attending: Neurology | Admitting: Occupational Therapy

## 2022-04-27 DIAGNOSIS — R2681 Unsteadiness on feet: Secondary | ICD-10-CM | POA: Insufficient documentation

## 2022-04-27 DIAGNOSIS — R29818 Other symptoms and signs involving the nervous system: Secondary | ICD-10-CM | POA: Diagnosis present

## 2022-04-27 DIAGNOSIS — R278 Other lack of coordination: Secondary | ICD-10-CM | POA: Insufficient documentation

## 2022-04-27 DIAGNOSIS — R293 Abnormal posture: Secondary | ICD-10-CM | POA: Insufficient documentation

## 2022-04-27 DIAGNOSIS — R29898 Other symptoms and signs involving the musculoskeletal system: Secondary | ICD-10-CM | POA: Insufficient documentation

## 2022-04-27 NOTE — Therapy (Signed)
OUTPATIENT OCCUPATIONAL THERAPY PARKINSON'S TREATMENT  Patient Name: Joshua [name] MRN: [id number] DOB:1945-08-03, 77 y.o., male Today's Date:   PCP: [name], MD  REFERRING PROVIDER: Vladimir Faster, DO   OT End of Session - 04/27/22 1020     Visit Number 9    Number of Visits 25    Date for OT Re-Evaluation 05/01/22    Authorization Type [employer]    Progress Note Due on Visit 10    OT Start Time 1020    OT Stop Time 1105    OT Time Calculation (min) 45 min    Activity Tolerance Patient tolerated treatment well    Behavior During Therapy [facility] for tasks assessed/performed              Past Medical History:  Diagnosis Date   Allergic rhinitis    Allergy    Anal fissure    Anxiety    from chronic pain from surgery- on Cymbalta   Arthritis    Asthma    Barrett's esophagus 03/29/2014   Carotid artery disease    Carotid Doppler normal August, 2007   Cataract    Chest pain    Coronaries normal 1996 /  nuclear..06/2002..normal...EF  56% /  stress echo.. May, 2011.... no  scar or ischemia... rate related RBBB   Diverticulosis    Dyslipidemia    Ejection fraction    EF 60%, stress echo, May, 2011   GERD (gastroesophageal reflux disease)    Headache    History of loop recorder    has since 04/04/15   HTN (hypertension)    takes Metoprolol for PVC control   Hx of colonic polyps    adenomatous   Hx of colonoscopy    Hyperlipidemia    IFG (impaired fasting glucose)    Palpitations    Benign PVCs   Parkinson disease    Prostate cancer    RBBB (right bundle branch block)    rate related   Shingles    Stroke    TIA   TIA (transient ischemic attack) 02/2015   Per pt, had 2 strokes   Tremor    Hand tremor   Vertigo    Past Surgical History:  Procedure Laterality Date   BACK SURGERY  2002,2009   x 6   CHOLECYSTECTOMY  11/30/2012   with IOC   COLONOSCOPY     ELECTROPHYSIOLOGIC STUDY N/A 09/26/2015   Procedure: V Tach Ablation (PVC);   Surgeon: Will Jorja Loa, MD;  [facility];  Service: Cardiovascular;  Laterality: N/A;   EP IMPLANTABLE DEVICE N/A 04/04/2015   Procedure: Loop Recorder Insertion;  Surgeon: Will Jorja Loa, MD;  [facility]: [facility];  Service: Cardiovascular;  Laterality: N/A;   HERNIA REPAIR     laprascopic   KNEE SURGERY     DECEMBER 2017,LEFT KNEE SCOPED   LEFT HEART CATH AND CORONARY ANGIOGRAPHY N/A 02/05/2021   Procedure: LEFT HEART CATH AND CORONARY ANGIOGRAPHY;  Surgeon: Iran Ouch, MD;  [facility]: [facility];  Service: Cardiovascular;  Laterality: N/A;   NECK SURGERY  2002   POLYPECTOMY     ROTATOR CUFF REPAIR Left    TEE WITHOUT CARDIOVERSION N/A 04/04/2015   Procedure: TRANSESOPHAGEAL ECHOCARDIOGRAM (TEE);  Surgeon: Lewayne Bunting, MD;  Location: [facility] ENDOSCOPY;  Service: Cardiovascular;  Laterality: N/A;   TRIGGER FINGER RELEASE Left 12/28/2019   Procedure: RELEASE TRIGGER FINGER/A-1 PULLEY THUMB, MIDDLE AND RING;  Surgeon: Merlyn Lot,  Jillyn Hidden, MD;  Location: [facility];  Service: Orthopedics;  Laterality: Left;  FAB   UPPER GASTROINTESTINAL ENDOSCOPY     V Tach ablation  09/26/2015   Patient Active Problem List   Diagnosis Date Noted   Coronary artery disease involving native coronary artery of native heart without angina pectoris 05/28/2021   History of myocardial infarction 02/05/2021   Parkinson's disease    Malignant neoplasm of prostate 11/01/2017   Essential tremor 12/06/2015   PVC (premature ventricular contraction) 09/26/2015   Embolic stroke involving left middle cerebral artery 04/07/2015   Acute CVA (cerebrovascular accident) 03/06/2015   Stroke (cerebrum) 03/05/2015   CVA (cerebral infarction) 03/05/2015   Weakness of right upper extremity    Tremor    Ejection fraction    Vertigo    Sleep apnea    Palpitations    Hyperlipidemia with target LDL less than 70    GERD (gastroesophageal reflux disease)    HTN  (hypertension)    Chest pain    RBBB (right bundle branch block)    Ejection fraction    Carotid artery disease (HCC)     ONSET DATE: 11/18/2021   REFERRING DIAG: G20.A1 (ICD-10-CM) - Parkinson's disease, unspecified whether dyskinesia present, unspecified whether manifestations fluctuate R29.898 (ICD-10-CM) - Rigidity  THERAPY DIAG:   THERAPY DIAG:  Other symptoms and signs involving the nervous system  Other symptoms and signs involving the musculoskeletal system  Other lack of coordination  Unsteadiness on feet  Abnormal posture  Rationale for Evaluation and Treatment: Rehabilitation  SUBJECTIVE:   SUBJECTIVE STATEMENT: I feel pretty good now. I was sick for 2 weeks   Pt accompanied by: self  PERTINENT HISTORY: PMH: PD, Anxiety, HTN, HLD, multiple back surgeries/fusion neck and lumbar spine,  NSTEMI, 01/2020, hx of multiple syncopal episodes   PRECAUTIONS: Fall  WEIGHT BEARING RESTRICTIONS: No  PAIN:  Are you having pain? Yes: NPRS scale: 8/10 Pain location: Rt knee; R wrist (6/10) Pain description: aggravated  Aggravating factors: cold weather Relieving factors: rest, stretching, heat  FALLS: Has patient fallen in last 6 months? Yes. Number of falls 1  LIVING ENVIRONMENT: Lives with: lives with their spouse Lives in: House/apartment - Town Home Stairs: No Has following equipment at home: Single point cane, Environmental consultant - 4 wheeled, Grab bars, and transport chair Has a lift chair and a lift bed.   PLOF: Independent; Horticulturist, commercial  PATIENT GOALS: reduce affects of tremor in RUE  OBJECTIVE:   HAND DOMINANCE: Right  ADLs: Overall ADLs: mod I Eating: setup Equipment: Shower seat with back, Grab bars, Walk in shower, Reacher, Sock aid, Long handled shoe horn, and Long handled sponge  IADLs: Shopping: able to do light shopping with shopping cart Light housekeeping: cleans bathrooms minus the tubs and uses light weight vacuum cleaners Meal Prep:  Mod I Community mobility: driving Medication management: mod I Financial management: 50/50 with wife Handwriting: 25% legible  MOBILITY STATUS: Needs Assist: uses SPC and Hx of falls  ACTIVITY TOLERANCE: Activity tolerance: fair  FUNCTIONAL OUTCOME MEASURES: To be assessed during future visits  UPPER EXTREMITY ROM:     AROM Right (eval) Left (eval)  Shoulder flexion WNL WNL  Shoulder abduction WNL WNL  Elbow flexion WNL WNL  Elbow extension WNL WNL  Wrist flexion 24 WNL  Wrist extension 15 WNL  Wrist pronation WFL WNL  Wrist supination WFL WNL   Digit Composite Flexion WNL WNL  Digit Composite Extension WNL WNL  Digit Opposition WNL WNL  (  Blank rows = not tested)  UPPER EXTREMITY MMT:     MMT Right (eval) Left (eval)  Shoulder flexion [facility] WNL  Shoulder abduction WFL WNL  Elbow flexion WFL WNL  Elbow extension WFL WNL  (Blank rows = not tested)  HAND FUNCTION: Grip strength: Right: 31.3 lbs; Left: 83.7 lbs  COORDINATION: 9 Hole Peg test: Right: 34.91 sec; Left: 48.99 sec Box and Blocks:  Right 34 blocks, Left 41blocks Tremors: Right, Left, and right worse than left  SENSATION: Mild, occasional paresthesias in R hand and fingers  EDEMA: mild arthritic changes to R wrist  MUSCLE TONE: RUE: Rigidity; mild  COGNITION: Overall cognitive status: Within functional limits for tasks assessed  VISION: Subjective report: Just got new glasses RX; limited changes Baseline vision: Wears glasses all the time; progressive lenses Visual history: n/a  OBSERVATIONS: Bradykinesia   TODAY'S TREATMENT:   Verbally reviewed ADL strategies and PWR! Moves w/ adaptations for back prn.  Assessed goals and progress to date - pt has actually improved most scores despite missing 3 weeks d/t illness.  Reviewed most current community resources and classes with patient and encouraged pt to attend 1 community class for physical and social benefits Discussed renewal vs. D/C  however pt has met all goals and feels good about progress at this time. Pt also reports he will continue his PWR! Moves and stay active at home. Therapist and pt agree to d/c   PATIENT EDUCATION: Education details:Community and online PD resources, strategies for tremors Person educated: Patient Education method: Explanation and Handouts Education comprehension: verbalized understanding   HOME EXERCISE PROGRAM: 2/13: coordination HEP, PWR! Hands 03/03/22: PWR! Seated, handwriting strategies 2/22: BIG MOVEMENT strategies with ADLs 03/17/22: medicine/meal schedule, ways to prevent future related PD complications 03/19/22: community and online resources for March, strategies for tremors  GOALS:  SHORT TERM GOALS: Target date: 03/03/2022    Pt will be independent with PD specific HEP.  Baseline: Goal status: MET  2.  Pt will verbalize understanding of adapted strategies to maximize safety and I with ADLs/IADLs. Baseline:  Goal status:MET  3.  Pt will verbalize understanding of ways to prevent future PD related complications and PD community resources.  Baseline:  Goal status: MET  4.  Pt will demonstrate improved ease with self feeding as evidenced by reducing PPT#2 by 3 secs Baseline: 16.28- 02/24/22 Goal status: MET (12.75 sec, 04/27/22 = 13.27 sec)    5. Pt will demonstrate increased ease with dressing as evidenced decreasing PPT#4 by 3 secs   Baseline:16.34-2/13/24 Goal status: MET (10.82 sec, 04/27/22 = 9.53 sec)    LONG TERM GOALS: Target date: 05/01/2022  Pt will write short paragraph with 100% legibility and no significant decrease in letter size. Baseline: 25% legible Goal status: MET w/ pencil in print  2.  Patient will complete nine-hole peg with use of L in 40 seconds or less. Baseline: 48 seconds  Goal status: MET (38.91 sec)   3.  Patient will demonstrate at least 41 lbs R grip strength as needed to open jars and other containers. Baseline: 31 lbs Goal status:  MET (48.9 LBS)    ASSESSMENT:  CLINICAL IMPRESSION: Pt has met all STG's and LTG's at this time. Pt agrees to d/c. Pt with improvements in all areas of ADLS and functional tasks.   PERFORMANCE DEFICITS: in functional skills including ADLs, IADLs, coordination, strength, Fine motor control, Gross motor control, mobility, balance, and UE functional use.  IMPAIRMENTS: are limiting patient from ADLs, IADLs, and  leisure.   COMORBIDITIES:  may have co-morbidities  that affects occupational performance. Patient will benefit from skilled OT to address above impairments and improve overall function.  MODIFICATION OR ASSISTANCE TO COMPLETE EVALUATION: Min-Moderate modification of tasks or assist with assess necessary to complete an evaluation.  OT OCCUPATIONAL PROFILE AND HISTORY: Problem focused assessment: Including review of records relating to presenting problem.  CLINICAL DECISION MAKING: LOW - limited treatment options, no task modification necessary  REHAB POTENTIAL: Fair given diagnosis  EVALUATION COMPLEXITY: Low   PLAN:  OT FREQUENCY: 2x/week  OT DURATION: 12 weeks  PLANNED INTERVENTIONS: self care/ADL training, therapeutic exercise, therapeutic activity, neuromuscular re-education, manual therapy, passive range of motion, balance training, functional mobility training, splinting, electrical stimulation, ultrasound, paraffin, fluidotherapy, moist heat, patient/family education, cognitive remediation/compensation, visual/perceptual remediation/compensation, energy conservation, coping strategies training, DME and/or AE instructions, and Re-evaluation  RECOMMENDED OTHER SERVICES: none at this time  CONSULTED AND AGREED WITH PLAN OF CARE: Patient  PLAN  D/C O.T. Pt to return in 6 months for screens    Sheran Lawless, OT 04/27/2022, 10:21 AM

## 2022-04-28 ENCOUNTER — Encounter: Payer: Self-pay | Admitting: Internal Medicine

## 2022-05-18 ENCOUNTER — Other Ambulatory Visit: Payer: Self-pay | Admitting: Neurology

## 2022-05-18 DIAGNOSIS — G20A1 Parkinson's disease without dyskinesia, without mention of fluctuations: Secondary | ICD-10-CM

## 2022-05-20 NOTE — Progress Notes (Unsigned)
Assessment/Plan:   1.  Parkinsons Disease, possibly ET/PD  -Continue carbidopa/levodopa 25/100,2 tablets at 7 AM/2 tablets at 10 AM/2 at 1pm/2 at 4pm/2 at 7pm  -Continue carbidopa/levodopa 50/200 at bedtime  -May repeat levodopa challenge in the future, especially if we consider surgical interventions.  We did this several years ago, but at that point in time he only had tremor (and he has levodopa resistant tremor).  He has since developed more bradykinesia.  In addition, we may retry pramipexole  -Patient had skin biopsy done for alpha-synuclein in January, 2024 which did demonstrate evidence of alpha-synuclein in all 3 sites.  Prescribed     2.  History of watershed infarct  -Had his linq recorder explanted after it stopped working and never showed any issues  -On Plavix  3.  History of multiple syncopal episodes, likely due to [facility]  -Off of propranolol and no further syncope.  cardiology has added metoprolol ever since his NSTEMI.    -continue midodrine 5 mg bid.   4.  Low back pain  -Has had surgical interventions in the past.  Has followed with Dr. Murray Hodgkins and Dr. Wynetta Emery  5.  NSTEMI, 01/2020  -He was started on metoprolol at that time, although his Imdur was stopped because of hypotension.  6.  Constipation  -Patient is following with gastroenterology, Dr. Marina Goodell.  He also has gastroenterology at [facility] because of his history of Barrett's esophagus, now status post endoscopic ablation. Subjective:   Joshua [name] was seen today in follow up for Parkinsons disease.  My previous records were reviewed prior to todays visit as well as outside records available to me.  Patient skin biopsy done since last visit.  There was evidence of alpha-synuclein in all cutaneous nerves of biopsied (all 3 sites).  Patient saw gastroenterology March 27.  Notes are reviewed.  Patient did complain about constipation..    Current prescribed movement disorder medications: Carbidopa/levodopa  25/100,2 tablets at 7 AM/2 tablets at 10 AM/2 at 1pm/2 at 4pm/2 at 7pm (an increase) Carbidopa/levodopa 50/200 at bedtime Midodrine 5 mg bid   PREVIOUS MEDICATIONS: Pramipexole (just was not helpful for tremor); primidone; propranolol; metoprolol  ALLERGIES:   Allergies  Allergen Reactions   Floxin I.V. In Dextrose 5% [Ofloxacin] Other (See Comments)    Lowers BP Lowers BP   Terazosin Other (See Comments)    Lower bp    CURRENT MEDICATIONS:  Outpatient Encounter Medications as of 11/18/2021  Medication Sig   acetaminophen (TYLENOL) 500 MG tablet Take 1,000 mg by mouth in the morning and at bedtime.   aspirin 81 MG EC tablet Take 1 tablet (81 mg total) by mouth daily. Swallow whole.   Biotin 57846 MCG TABS Take 1,000 mcg by mouth in the morning.   carbidopa-levodopa (SINEMET CR) 50-200 MG tablet TAKE 1 TABLET BY MOUTH EVERYDAY AT BEDTIME   cholecalciferol (VITAMIN D3) 25 MCG (1000 UNIT) tablet Take 1,000 Units by mouth daily.   clopidogrel (PLAVIX) 75 MG tablet Take 1 tablet (75 mg total) by mouth daily.   Cyanocobalamin (VITAMIN B 12 PO) Take 1,000 mcg by mouth daily.   DYMISTA 137-50 MCG/ACT SUSP Place 1 puff into both nostrils at bedtime.    escitalopram (LEXAPRO) 10 MG tablet Take 10 mg by mouth daily.   finasteride (PROSCAR) 5 MG tablet Take 5 mg by mouth every evening.   lansoprazole (PREVACID) 30 MG capsule Take 30 mg by mouth 2 (two) times daily. Morning & mid-afternoon   levocetirizine (XYZAL)  5 MG tablet Take 5 mg by mouth every evening.     metoprolol succinate (TOPROL-XL) 25 MG 24 hr tablet TAKE 1/2 TABLET BY MOUTH DAILY   midodrine (PROAMATINE) 5 MG tablet TAKE 1 TABLET BY MOUTH 2 TIMES DAILY AS NEEDED. TAKE IF SYSTOLIC BLOOD PRESSURE GETS BELOW 100   montelukast (SINGULAIR) 10 MG tablet Take 10 mg by mouth at bedtime.     nitroGLYCERIN (NITROSTAT) 0.4 MG SL tablet Place 1 tablet (0.4 mg total) under the tongue every 5 (five) minutes as needed for chest pain (Do not give  more than 3 SL tablets in 15 minutes.).   Omega-3 Fatty Acids (FISH OIL) 1000 MG CAPS Take 1,000 mg by mouth in the morning.   PROCTOSOL HC 2.5 % rectal cream APPLY INTO AND AROUND RECTUM 2 TIMES A DAY   rosuvastatin (CRESTOR) 20 MG tablet Take 1 tablet (20 mg total) by mouth at bedtime.   tamsulosin (FLOMAX) 0.4 MG CAPS capsule Take 0.4 mg by mouth daily after supper.    traMADol (ULTRAM) 50 MG tablet Take 50 mg by mouth every 6 (six) hours as needed. for pain   zolpidem (AMBIEN) 10 MG tablet Take 10 mg by mouth at bedtime.   [DISCONTINUED] carbidopa-levodopa (SINEMET IR) 25-100 MG tablet 2 TABLETS AT 7 AM/2 TABLETS AT 10 AM/2 AT 1PM/1 AT 4PM/1 AT 7PM   carbidopa-levodopa (SINEMET IR) 25-100 MG tablet 2 tablets at 7 AM/2 tablets at 10 AM/2 at 1pm/2 at 4pm/2 at 7pm   No facility-administered encounter medications on file as of 11/18/2021.    Objective:   PHYSICAL EXAMINATION:    VITALS:   Vitals:   11/18/21 0846  BP: 109/71  Pulse: 81  SpO2: 96%  Weight: 220 lb (99.8 kg)  Height: 6\' 1"  (1.854 m)       GEN:  The patient appears stated age and is in NAD. HEENT:  Normocephalic, atraumatic.  The mucous membranes are moist. The superficial temporal arteries are without ropiness or tenderness. CV:  RRR Lungs:  CTAB Neck/HEME:  There are no carotid bruits bilaterally.  Neurological examination:  Orientation: The patient is alert and oriented x3. Cranial nerves: There is good facial symmetry with facial hypomimia. The speech is fluent and clear. Soft palate rises symmetrically and there is no tongue deviation. Hearing is intact to conversational tone. Sensation: Sensation is intact to light touch throughout Motor: Strength is at least antigravity x4.  Movement examination: Tone: There is mild increased tone in the RLE Abnormal movements: there is no significant tremor today Coordination:  There is mild decremation, with any form of RAMS, including alternating supination and  pronation of the forearm, hand opening and closing, finger taps, heel taps and toe taps, mostly on the right Gait and Station: The patient has no difficulty arising out of a deep-seated chair without the use of the hands. The patient is forward flexed with decreased arm swing and he slightly drags the R leg  I have reviewed and interpreted the following labs independently    Chemistry      Component Value Date/Time   NA 136 02/14/2021 1528   K 4.1 02/14/2021 1528   CL 100 02/14/2021 1528   CO2 18 (L) 02/14/2021 1528   BUN 20 02/14/2021 1528   CREATININE 1.07 02/14/2021 1528   CREATININE 0.88 09/18/2015 1135      Component Value Date/Time   CALCIUM 9.6 02/14/2021 1528   ALKPHOS 58 02/05/2021 1147   AST 23 02/05/2021 1147  ALT 11 02/05/2021 1147   BILITOT 1.0 02/05/2021 1147   BILITOT 0.7 04/04/2015 1501       Lab Results  Component Value Date   WBC 7.9 02/14/2021   HGB 15.3 02/14/2021   HCT 44.3 02/14/2021   MCV 92 02/14/2021   PLT 262 02/14/2021    Lab Results  Component Value Date   TSH 1.240 04/04/2015     Total time spent on today's visit was *** minutes, including both face-to-face time and nonface-to-face time.  Time included that spent on review of records (prior notes available to me/labs/imaging if pertinent), discussing treatment and goals, answering patient's questions and coordinating care.  Cc:  [name], MD

## 2022-05-21 ENCOUNTER — Ambulatory Visit (INDEPENDENT_AMBULATORY_CARE_PROVIDER_SITE_OTHER): Payer: Medicare HMO | Admitting: Neurology

## 2022-05-21 VITALS — BP 146/80 | HR 59 | Wt 226.8 lb

## 2022-05-21 DIAGNOSIS — G20A1 Parkinson's disease without dyskinesia, without mention of fluctuations: Secondary | ICD-10-CM

## 2022-05-21 DIAGNOSIS — G903 Multi-system degeneration of the autonomic nervous system: Secondary | ICD-10-CM

## 2022-05-21 MED ORDER — AMBULATORY NON FORMULARY MEDICATION: 0 refills | Status: DC

## 2022-05-21 MED ORDER — ROPINIROLE HCL 0.25 MG PO TABS: ORAL_TABLET | ORAL | 0 refills | Status: DC

## 2022-05-21 MED ORDER — ROPINIROLE HCL 1 MG PO TABS
1.0000 mg | ORAL_TABLET | Freq: Three times a day (TID) | ORAL | 1 refills | Status: DC
Start: 2022-05-21 — End: 2022-11-13

## 2022-05-21 NOTE — Patient Instructions (Addendum)
Start requip (ropinirole) as follows:  requip 0.25 mg three times a day x 1 week and then 2 tablets three times per day x 1 week, then 3 tablets three times a day x 1 week and then 1.0 mg three times per day thereafter    As a reminder, carbidopa/levodopa can be taken at the same time as a carbohydrate, but we like to have you take your pill either 30 minutes before a protein source or 1 hour after as protein can interfere with carbidopa/levodopa absorption.

## 2022-05-26 ENCOUNTER — Inpatient Hospital Stay (HOSPITAL_BASED_OUTPATIENT_CLINIC_OR_DEPARTMENT_OTHER)
Admission: EM | Admit: 2022-05-26 | Discharge: 2022-05-31 | DRG: 390 | Disposition: A | Discharge status: Home / Self Care | Payer: Medicare HMO | Attending: Internal Medicine | Admitting: Internal Medicine

## 2022-05-26 ENCOUNTER — Encounter (HOSPITAL_BASED_OUTPATIENT_CLINIC_OR_DEPARTMENT_OTHER): Payer: Self-pay | Admitting: Emergency Medicine

## 2022-05-26 ENCOUNTER — Emergency Department (HOSPITAL_BASED_OUTPATIENT_CLINIC_OR_DEPARTMENT_OTHER): Payer: Medicare HMO

## 2022-05-26 ENCOUNTER — Other Ambulatory Visit: Payer: Self-pay

## 2022-05-26 ENCOUNTER — Other Ambulatory Visit (HOSPITAL_BASED_OUTPATIENT_CLINIC_OR_DEPARTMENT_OTHER): Payer: Self-pay

## 2022-05-26 DIAGNOSIS — T465X6A Underdosing of other antihypertensive drugs, initial encounter: Secondary | ICD-10-CM | POA: Diagnosis not present

## 2022-05-26 DIAGNOSIS — R079 Chest pain, unspecified: Secondary | ICD-10-CM | POA: Diagnosis present

## 2022-05-26 DIAGNOSIS — Z9049 Acquired absence of other specified parts of digestive tract: Secondary | ICD-10-CM | POA: Diagnosis not present

## 2022-05-26 DIAGNOSIS — K429 Umbilical hernia without obstruction or gangrene: Secondary | ICD-10-CM | POA: Diagnosis present

## 2022-05-26 DIAGNOSIS — N400 Enlarged prostate without lower urinary tract symptoms: Secondary | ICD-10-CM | POA: Diagnosis present

## 2022-05-26 DIAGNOSIS — I252 Old myocardial infarction: Secondary | ICD-10-CM | POA: Diagnosis not present

## 2022-05-26 DIAGNOSIS — E785 Hyperlipidemia, unspecified: Secondary | ICD-10-CM | POA: Diagnosis not present

## 2022-05-26 DIAGNOSIS — R072 Precordial pain: Secondary | ICD-10-CM

## 2022-05-26 DIAGNOSIS — K56609 Unspecified intestinal obstruction, unspecified as to partial versus complete obstruction: Principal | ICD-10-CM | POA: Diagnosis present

## 2022-05-26 DIAGNOSIS — Z1152 Encounter for screening for COVID-19: Secondary | ICD-10-CM

## 2022-05-26 DIAGNOSIS — R7303 Prediabetes: Secondary | ICD-10-CM | POA: Diagnosis not present

## 2022-05-26 DIAGNOSIS — K219 Gastro-esophageal reflux disease without esophagitis: Secondary | ICD-10-CM | POA: Diagnosis present

## 2022-05-26 DIAGNOSIS — Z8546 Personal history of malignant neoplasm of prostate: Secondary | ICD-10-CM

## 2022-05-26 DIAGNOSIS — Z832 Family history of diseases of the blood and blood-forming organs and certain disorders involving the immune mechanism: Secondary | ICD-10-CM

## 2022-05-26 DIAGNOSIS — Z7982 Long term (current) use of aspirin: Secondary | ICD-10-CM | POA: Diagnosis not present

## 2022-05-26 DIAGNOSIS — Z803 Family history of malignant neoplasm of breast: Secondary | ICD-10-CM | POA: Diagnosis not present

## 2022-05-26 DIAGNOSIS — Z8249 Family history of ischemic heart disease and other diseases of the circulatory system: Secondary | ICD-10-CM

## 2022-05-26 DIAGNOSIS — G20A1 Parkinson's disease without dyskinesia, without mention of fluctuations: Secondary | ICD-10-CM | POA: Diagnosis not present

## 2022-05-26 DIAGNOSIS — I493 Ventricular premature depolarization: Secondary | ICD-10-CM

## 2022-05-26 DIAGNOSIS — I10 Essential (primary) hypertension: Secondary | ICD-10-CM | POA: Diagnosis not present

## 2022-05-26 DIAGNOSIS — Z79899 Other long term (current) drug therapy: Secondary | ICD-10-CM | POA: Diagnosis not present

## 2022-05-26 DIAGNOSIS — J45909 Unspecified asthma, uncomplicated: Secondary | ICD-10-CM | POA: Diagnosis not present

## 2022-05-26 DIAGNOSIS — I4510 Unspecified right bundle-branch block: Secondary | ICD-10-CM | POA: Diagnosis present

## 2022-05-26 DIAGNOSIS — E876 Hypokalemia: Secondary | ICD-10-CM | POA: Diagnosis present

## 2022-05-26 DIAGNOSIS — I2510 Atherosclerotic heart disease of native coronary artery without angina pectoris: Secondary | ICD-10-CM | POA: Diagnosis not present

## 2022-05-26 DIAGNOSIS — Z7902 Long term (current) use of antithrombotics/antiplatelets: Secondary | ICD-10-CM

## 2022-05-26 DIAGNOSIS — R1013 Epigastric pain: Secondary | ICD-10-CM | POA: Diagnosis present

## 2022-05-26 DIAGNOSIS — Z8042 Family history of malignant neoplasm of prostate: Secondary | ICD-10-CM | POA: Diagnosis not present

## 2022-05-26 DIAGNOSIS — Z91148 Patient's other noncompliance with medication regimen for other reason: Secondary | ICD-10-CM

## 2022-05-26 DIAGNOSIS — Z8673 Personal history of transient ischemic attack (TIA), and cerebral infarction without residual deficits: Secondary | ICD-10-CM

## 2022-05-26 DIAGNOSIS — Z801 Family history of malignant neoplasm of trachea, bronchus and lung: Secondary | ICD-10-CM

## 2022-05-26 LAB — BASIC METABOLIC PANEL
Anion gap: 12 (ref 5–15)
BUN: 20 mg/dL (ref 8–23)
CO2: 21 mmol/L — ABNORMAL LOW (ref 22–32)
Calcium: 9.1 mg/dL (ref 8.9–10.3)
Chloride: 104 mmol/L (ref 98–111)
Creatinine, Ser: 0.78 mg/dL (ref 0.61–1.24)
GFR, Estimated: >60 mL/min (ref >60)
Glucose, Bld: 127 mg/dL — ABNORMAL HIGH (ref 70–99)
Potassium: 4 mmol/L (ref 3.5–5.1)
Sodium: 137 mmol/L (ref 135–145)

## 2022-05-26 LAB — CBC
HCT: 46.4 % (ref 39.0–52.0)
Hemoglobin: 15.7 g/dL (ref 13.0–17.0)
MCH: 32.8 pg (ref 26.0–34.0)
MCHC: 33.8 g/dL (ref 30.0–36.0)
MCV: 96.9 fL (ref 80.0–100.0)
Platelets: 197 10*3/uL (ref 150–400)
RBC: 4.79 MIL/uL (ref 4.22–5.81)
RDW: 13.4 % (ref 11.5–15.5)
WBC: 5.1 10*3/uL (ref 4.0–10.5)
nRBC: 0 % (ref 0.0–0.2)

## 2022-05-26 LAB — RESP PANEL BY RT-PCR (RSV, FLU A&B, COVID)  RVPGX2
Influenza A by PCR: NEGATIVE
Influenza B by PCR: NEGATIVE
Resp Syncytial Virus by PCR: NEGATIVE
SARS Coronavirus 2 by RT PCR: NEGATIVE

## 2022-05-26 LAB — BRAIN NATRIURETIC PEPTIDE: B Natriuretic Peptide: 33.4 pg/mL (ref 0.0–100.0)

## 2022-05-26 LAB — LIPID PANEL
Cholesterol: 85 mg/dL (ref 0–200)
HDL: 46 mg/dL (ref >40)
LDL Cholesterol: 23 mg/dL (ref 0–99)
Total CHOL/HDL Ratio: 1.8 RATIO
Triglycerides: 80 mg/dL (ref <150)
VLDL: 16 mg/dL (ref 0–40)

## 2022-05-26 LAB — TROPONIN I (HIGH SENSITIVITY)
Troponin I (High Sensitivity): 6 ng/L (ref <18)
Troponin I (High Sensitivity): 6 ng/L (ref <18)

## 2022-05-26 LAB — TSH: TSH: 0.893 u[IU]/mL (ref 0.350–4.500)

## 2022-05-26 LAB — LIPASE, BLOOD: Lipase: 23 U/L (ref 11–51)

## 2022-05-26 LAB — HEMOGLOBIN A1C
Hgb A1c MFr Bld: 5.3 % (ref 4.8–5.6)
Mean Plasma Glucose: 105.41 mg/dL

## 2022-05-26 MED ORDER — POLYETHYLENE GLYCOL 3350 17 G PO PACK: 17.0000 g | PACK | Freq: Every day | ORAL | Status: DC | PRN

## 2022-05-26 MED ORDER — PANTOPRAZOLE SODIUM 40 MG IV SOLR
40.0000 mg | INTRAVENOUS | Status: DC
  Administered 2022-05-26 – 2022-05-27 (×2): 40 mg via INTRAVENOUS
  Filled 2022-05-26 (×2): qty 10

## 2022-05-26 MED ORDER — LACTATED RINGERS IV SOLN: INTRAVENOUS | Status: DC

## 2022-05-26 MED ORDER — LEVOCETIRIZINE DIHYDROCHLORIDE 5 MG PO TABS: 5.0000 mg | ORAL_TABLET | Freq: Every evening | ORAL | Status: DC

## 2022-05-26 MED ORDER — ACETAMINOPHEN 325 MG PO TABS
650.0000 mg | ORAL_TABLET | Freq: Four times a day (QID) | ORAL | Status: DC | PRN
  Administered 2022-05-30: 650 mg via ORAL
  Filled 2022-05-26: qty 2

## 2022-05-26 MED ORDER — CARBIDOPA-LEVODOPA ER 50-200 MG PO TBCR
1.0000 | EXTENDED_RELEASE_TABLET | Freq: Every day | ORAL | Status: DC
  Administered 2022-05-26 – 2022-05-30 (×4): 1 via ORAL
  Filled 2022-05-26 (×6): qty 1

## 2022-05-26 MED ORDER — TRAMADOL HCL 50 MG PO TABS
50.0000 mg | ORAL_TABLET | Freq: Four times a day (QID) | ORAL | Status: DC | PRN
  Administered 2022-05-27 – 2022-05-28 (×2): 50 mg via ORAL
  Filled 2022-05-26 (×2): qty 1

## 2022-05-26 MED ORDER — HEPARIN (PORCINE) 25000 UT/250ML-% IV SOLN
1200.0000 [IU]/h | INTRAVENOUS | Status: DC
  Administered 2022-05-26: 1200 [IU]/h via INTRAVENOUS
  Filled 2022-05-26: qty 250

## 2022-05-26 MED ORDER — AZELASTINE HCL 0.1 % NA SOLN
1.0000 | Freq: Every day | NASAL | Status: DC
  Administered 2022-05-27 – 2022-05-29 (×4): 1 via NASAL
  Filled 2022-05-26: qty 30

## 2022-05-26 MED ORDER — ENOXAPARIN SODIUM 40 MG/0.4ML IJ SOSY
40.0000 mg | PREFILLED_SYRINGE | INTRAMUSCULAR | Status: DC
  Administered 2022-05-26 – 2022-05-28 (×3): 40 mg via SUBCUTANEOUS
  Filled 2022-05-26 (×3): qty 0.4

## 2022-05-26 MED ORDER — TAMSULOSIN HCL 0.4 MG PO CAPS
0.4000 mg | ORAL_CAPSULE | Freq: Every day | ORAL | Status: DC
  Administered 2022-05-26 – 2022-05-30 (×4): 0.4 mg via ORAL
  Filled 2022-05-26 (×4): qty 1

## 2022-05-26 MED ORDER — ASPIRIN 325 MG PO TBEC
325.0000 mg | DELAYED_RELEASE_TABLET | Freq: Once | ORAL | Status: AC
  Administered 2022-05-26: 325 mg via ORAL
  Filled 2022-05-26: qty 1

## 2022-05-26 MED ORDER — CLOPIDOGREL BISULFATE 75 MG PO TABS
75.0000 mg | ORAL_TABLET | Freq: Every day | ORAL | Status: DC
  Administered 2022-05-27 – 2022-05-28 (×2): 75 mg via ORAL
  Filled 2022-05-26 (×2): qty 1

## 2022-05-26 MED ORDER — METOPROLOL SUCCINATE ER 25 MG PO TB24: 12.5000 mg | ORAL_TABLET | Freq: Every day | ORAL | Status: DC

## 2022-05-26 MED ORDER — HEPARIN BOLUS VIA INFUSION
4000.0000 [IU] | Freq: Once | INTRAVENOUS | Status: AC
  Administered 2022-05-26: 4000 [IU] via INTRAVENOUS

## 2022-05-26 MED ORDER — MEXILETINE HCL 150 MG PO CAPS
150.0000 mg | ORAL_CAPSULE | Freq: Two times a day (BID) | ORAL | Status: DC
  Administered 2022-05-26 – 2022-05-31 (×5): 150 mg via ORAL
  Filled 2022-05-26 (×11): qty 1

## 2022-05-26 MED ORDER — ACETAMINOPHEN 650 MG RE SUPP: 650.0000 mg | Freq: Four times a day (QID) | RECTAL | Status: DC | PRN

## 2022-05-26 MED ORDER — MONTELUKAST SODIUM 10 MG PO TABS
10.0000 mg | ORAL_TABLET | Freq: Every day | ORAL | Status: DC
  Administered 2022-05-26 – 2022-05-30 (×4): 10 mg via ORAL
  Filled 2022-05-26 (×4): qty 1

## 2022-05-26 MED ORDER — ASPIRIN 81 MG PO TBEC
81.0000 mg | DELAYED_RELEASE_TABLET | Freq: Every day | ORAL | Status: DC
  Administered 2022-05-27 – 2022-05-31 (×4): 81 mg via ORAL
  Filled 2022-05-26 (×4): qty 1

## 2022-05-26 MED ORDER — REGADENOSON 0.4 MG/5ML IV SOLN
0.4000 mg | Freq: Once | INTRAVENOUS | Status: AC
  Administered 2022-05-28: 0.4 mg via INTRAVENOUS
  Filled 2022-05-26 (×2): qty 5

## 2022-05-26 MED ORDER — CARBIDOPA-LEVODOPA 25-100 MG PO TABS
2.0000 | ORAL_TABLET | ORAL | Status: DC
  Administered 2022-05-26 – 2022-05-31 (×17): 2 via ORAL
  Filled 2022-05-26 (×17): qty 2

## 2022-05-26 MED ORDER — FINASTERIDE 5 MG PO TABS
5.0000 mg | ORAL_TABLET | Freq: Every evening | ORAL | Status: DC
  Administered 2022-05-26 – 2022-05-30 (×4): 5 mg via ORAL
  Filled 2022-05-26 (×4): qty 1

## 2022-05-26 MED ORDER — FLUTICASONE PROPIONATE 50 MCG/ACT NA SUSP
1.0000 | Freq: Every day | NASAL | Status: DC
  Administered 2022-05-27 – 2022-05-29 (×4): 1 via NASAL
  Filled 2022-05-26: qty 16

## 2022-05-26 MED ORDER — DOCUSATE SODIUM 100 MG PO CAPS
100.0000 mg | ORAL_CAPSULE | Freq: Two times a day (BID) | ORAL | Status: DC
  Administered 2022-05-30: 100 mg via ORAL
  Filled 2022-05-26 (×4): qty 1

## 2022-05-26 MED ORDER — ROPINIROLE HCL 0.25 MG PO TABS
0.2500 mg | ORAL_TABLET | Freq: Three times a day (TID) | ORAL | Status: DC
  Administered 2022-05-26 – 2022-05-28 (×6): 0.25 mg via ORAL
  Filled 2022-05-26 (×8): qty 1

## 2022-05-26 MED ORDER — AZELASTINE-FLUTICASONE 137-50 MCG/ACT NA SUSP: 1.0000 | Freq: Every day | NASAL | Status: DC

## 2022-05-26 MED ORDER — ORAL CARE MOUTH RINSE: 15.0000 mL | OROMUCOSAL | Status: DC | PRN

## 2022-05-26 MED ORDER — ESCITALOPRAM OXALATE 10 MG PO TABS
20.0000 mg | ORAL_TABLET | Freq: Every day | ORAL | Status: DC
  Administered 2022-05-27 – 2022-05-31 (×4): 20 mg via ORAL
  Filled 2022-05-26 (×4): qty 2

## 2022-05-26 MED ORDER — LORATADINE 10 MG PO TABS
10.0000 mg | ORAL_TABLET | Freq: Every evening | ORAL | Status: DC
  Administered 2022-05-26 – 2022-05-30 (×4): 10 mg via ORAL
  Filled 2022-05-26 (×4): qty 1

## 2022-05-26 MED ORDER — ZOLPIDEM TARTRATE 5 MG PO TABS
5.0000 mg | ORAL_TABLET | Freq: Every day | ORAL | Status: DC
  Administered 2022-05-26 – 2022-05-30 (×4): 5 mg via ORAL
  Filled 2022-05-26 (×4): qty 1

## 2022-05-26 MED ORDER — ONDANSETRON HCL 4 MG PO TABS: 4.0000 mg | ORAL_TABLET | Freq: Four times a day (QID) | ORAL | Status: DC | PRN

## 2022-05-26 MED ORDER — ONDANSETRON HCL 4 MG/2ML IJ SOLN
4.0000 mg | Freq: Four times a day (QID) | INTRAMUSCULAR | Status: DC | PRN
  Filled 2022-05-26: qty 2

## 2022-05-26 MED ORDER — SODIUM CHLORIDE 0.9% FLUSH
3.0000 mL | Freq: Two times a day (BID) | INTRAVENOUS | Status: DC
  Administered 2022-05-26 – 2022-05-31 (×8): 3 mL via INTRAVENOUS

## 2022-05-26 MED ORDER — HYDRALAZINE HCL 20 MG/ML IJ SOLN: 5.0000 mg | INTRAMUSCULAR | Status: DC | PRN

## 2022-05-26 MED ORDER — ROSUVASTATIN CALCIUM 20 MG PO TABS
20.0000 mg | ORAL_TABLET | Freq: Every day | ORAL | Status: DC
  Administered 2022-05-26 – 2022-05-30 (×4): 20 mg via ORAL
  Filled 2022-05-26 (×4): qty 1

## 2022-05-26 MED ORDER — BISACODYL 5 MG PO TBEC: 5.0000 mg | DELAYED_RELEASE_TABLET | Freq: Every day | ORAL | Status: DC | PRN

## 2022-05-26 NOTE — Progress Notes (Signed)
Bed placement contacted. Informed that patient has arrived to unit.

## 2022-05-26 NOTE — H&P (Signed)
History and Physical    Patient: Joshua [name] FAO:[id number] DOB: 10/07/1945 DOA: 05/26/2022 DOS: the patient was seen and examined on 05/26/2022 PCP: [name], MD  Patient coming from: Home - lives with wife; NOK: Marcene Duos [phone]   Chief Complaint: chest pain  HPI: Joshua [name] is a 77 y.o. male with medical history significant of Parkinson's, prostate CA, HLD, HTN, preDM, and TIA presenting with chest pain and SOB.   He went to bed last night feeling fine.  He awoke about 0330 hurting all the way through his chest and into his back.  Nothing makes it better or worse.  Moving it makes it hurt (passive or active).  Last heart attack was in 2022, pain is similar to index symptoms.  +SOB, nausea.  He ws started on a new medication for her Parkinson's about a week ago (ropinirole) - he has felt like that was going well.  Last cath was 02/05/21 with multivessel disease with systolic dysfunction, planned for DAPT.  Echo was done 1/26 with preserved EF and grade 1 diastolic dysfunction.  He continues to report epigastric pain that radiates into his back.  He sees [facility] and had a recent colonoscopy and endoscopy (4/10).  EGD with HH, fundic gland polyps, and mild esophageal dilation.  Pathology was unremarkable.    ER Course:  CP started about midnight, cardiology will consult. Troponin 6, EKG abnormal but not STEMI. Still having chest pain. Started on ASA and heparin.     Review of Systems: As mentioned in the history of present illness. All other systems reviewed and are negative. Past Medical History:  Diagnosis Date   Allergic rhinitis    Anxiety    from chronic pain from surgery- on Cymbalta   Arthritis    Asthma    Barrett's esophagus 03/29/2014   Carotid artery disease (HCC)    Carotid Doppler normal August, 2007   Diverticulosis    Dyslipidemia    GERD (gastroesophageal reflux disease)    History of loop recorder    has since 04/04/15    HTN (hypertension)    takes Metoprolol for PVC control   Hx of colonic polyps    adenomatous   IFG (impaired fasting glucose)    Palpitations    Benign PVCs   Parkinson disease    Prostate cancer (HCC)    RBBB (right bundle branch block)    rate related   Shingles    TIA (transient ischemic attack) 02/2015   Per pt, had 2 strokes   Vertigo    Past Surgical History:  Procedure Laterality Date   BACK SURGERY  2002,2009   x 6   CHOLECYSTECTOMY  11/30/2012   with IOC   COLONOSCOPY     ELECTROPHYSIOLOGIC STUDY N/A 09/26/2015   Procedure: V Tach Ablation (PVC);  Surgeon: Will Jorja Loa, MD;  Location: [facility];  Service: Cardiovascular;  Laterality: N/A;   EP IMPLANTABLE DEVICE N/A 04/04/2015   Procedure: Loop Recorder Insertion;  Surgeon: Will Jorja Loa, MD;  Location: [facility];  Service: Cardiovascular;  Laterality: N/A;   HERNIA REPAIR     laprascopic   KNEE SURGERY     DECEMBER 2017,LEFT KNEE SCOPED   LEFT HEART CATH AND CORONARY ANGIOGRAPHY N/A 02/05/2021   Procedure: LEFT HEART CATH AND CORONARY ANGIOGRAPHY;  Surgeon: Iran Ouch, MD;  Location: [facility];  Service: Cardiovascular;  Laterality: N/A;   NECK SURGERY  2002  POLYPECTOMY     ROTATOR CUFF REPAIR Left    TEE WITHOUT CARDIOVERSION N/A 04/04/2015   Procedure: TRANSESOPHAGEAL ECHOCARDIOGRAM (TEE);  Surgeon: Lewayne Bunting, MD;  Location: [facility] ENDOSCOPY;  Service: Cardiovascular;  Laterality: N/A;   TRIGGER FINGER RELEASE Left 12/28/2019   Procedure: RELEASE TRIGGER FINGER/A-1 PULLEY THUMB, MIDDLE AND RING;  Surgeon: Cindee Salt, MD;  Location: [facility];  Service: Orthopedics;  Laterality: Left;  FAB   UPPER GASTROINTESTINAL ENDOSCOPY     V Tach ablation  09/26/2015   Social History:  reports that he has never smoked. He has never used smokeless tobacco. He reports that he does not drink alcohol and does not use drugs.  Allergies  Allergen Reactions    Floxin I.V. In Dextrose 5% [Ofloxacin] Other (See Comments)    Lowers BP Lowers BP   Terazosin Other (See Comments)    Lower bp    Family History  Problem Relation Age of Onset   Hypertension Mother    Heart attack Father    Heart disease Father    Clotting disorder Father    Heart disease Brother    Lung cancer Brother    Prostate cancer Brother    Breast cancer Paternal Grandmother    Heart attack Paternal Grandfather    Healthy Daughter    Healthy Daughter    Esophageal cancer Neg Hx    Stomach cancer Neg Hx    Rectal cancer Neg Hx    Colon polyps Neg Hx     Prior to Admission medications   Medication Sig Start Date End Date Taking? Authorizing Provider  acetaminophen (TYLENOL) 500 MG tablet Take 1,000 mg by mouth in the morning and at bedtime.   Yes [provider]  ASPIRIN LOW DOSE 81 MG tablet TAKE 1 TABLET BY MOUTH EVERY DAY 02/19/22  Yes Jake Bathe, MD  Biotin 16109 MCG TABS Take 1,000 mcg by mouth in the morning.   Yes [provider]  carbidopa-levodopa (SINEMET CR) 50-200 MG tablet TAKE 1 TABLET BY MOUTH EVERYDAY AT BEDTIME 12/12/21  Yes Tat, Rebecca S, DO  carbidopa-levodopa (SINEMET IR) 25-100 MG tablet 2 TABLETS AT 7 AM, 2 TABLETS AT 10 AM,2 TABLETS AT 1PM,2 TABLETS AT 4 PM /2 TABLETS AT 7 pm 05/18/22  Yes Tat, Octaviano Batty, DO  cholecalciferol (VITAMIN D3) 25 MCG (1000 UNIT) tablet Take 1,000 Units by mouth daily.   Yes [provider]  clopidogrel (PLAVIX) 75 MG tablet Take 1 tablet (75 mg total) by mouth daily. 03/07/15  Yes Meredeth Ide, MD  Cyanocobalamin (VITAMIN B 12 PO) Take 1,000 mcg by mouth daily.   Yes [provider]  DYMISTA 137-50 MCG/ACT SUSP Place 1 puff into both nostrils at bedtime.  03/24/11  Yes [provider]  escitalopram (LEXAPRO) 10 MG tablet Take 20 mg by mouth daily. 07/21/18  Yes [provider]  finasteride (PROSCAR) 5 MG tablet Take 5 mg by mouth every evening. 04/17/11  Yes [provider]  lansoprazole (PREVACID) 30 MG capsule Take 30 mg by mouth 2 (two) times daily. Morning & mid-afternoon 11/12/19  Yes [provider]  levocetirizine (XYZAL) 5 MG tablet Take 5 mg by mouth every evening.     Yes [provider]  metoprolol succinate (TOPROL-XL) 25 MG 24 hr tablet TAKE 1/2 TABLET BY MOUTH DAILY 06/24/21  Yes Laverda Page B, NP  mexiletine (MEXITIL) 150 MG capsule Take 1 capsule (150 mg total) by mouth 2 (two)  times daily. 01/06/22  Yes Camnitz, Will Daphine Deutscher, MD  midodrine (PROAMATINE) 5 MG tablet TAKE 1 TABLET BY MOUTH 2 TIMES DAILY AS NEEDED. TAKE IF SYSTOLIC BLOOD PRESSURE GETS BELOW 100 08/27/21  Yes Camnitz, Will Daphine Deutscher, MD  montelukast (SINGULAIR) 10 MG tablet Take 10 mg by mouth at bedtime.     Yes [provider]  Omega-3 Fatty Acids (FISH OIL) 1000 MG CAPS Take 2,000 mg by mouth in the morning.   Yes [provider]  PROCTOSOL HC 2.5 % rectal cream APPLY INTO AND AROUND RECTUM 2 TIMES A DAY 02/12/17  Yes Hilarie Fredrickson, MD  rOPINIRole (REQUIP) 0.25 MG tablet 1 po tid for 1 week, then 2 po tid, then 3 tabs po tid x 1 week 05/21/22  Yes Tat, Octaviano Batty, DO  rosuvastatin (CRESTOR) 20 MG tablet Take 1 tablet (20 mg total) by mouth at bedtime. 02/07/21  Yes Laverda Page B, NP  tamsulosin (FLOMAX) 0.4 MG CAPS capsule Take 0.4 mg by mouth daily after supper.  05/25/13  Yes [provider]  traMADol (ULTRAM) 50 MG tablet Take 50 mg by mouth every 6 (six) hours as needed. for pain 09/28/17  Yes [provider]  zolpidem (AMBIEN) 10 MG tablet Take 10 mg by mouth at bedtime. 05/13/11  Yes [provider]  AMBULATORY NON FORMULARY MEDICATION Dispense 1 electric scooter DX G20.A 1 Patient not taking: Reported on 05/26/2022 05/21/22   Tat, Octaviano Batty, DO  nitroGLYCERIN (NITROSTAT) 0.4 MG SL tablet Place 1 tablet (0.4 mg total) under the tongue every 5 (five) minutes as needed for chest pain (Do not give more than 3 SL  tablets in 15 minutes.). 02/07/21   Arty Baumgartner, NP  rOPINIRole (REQUIP) 1 MG tablet Take 1 tablet (1 mg total) by mouth 3 (three) times daily. 05/21/22   Vladimir Faster, DO    Physical Exam: Vitals:   05/26/22 1400 05/26/22 1430 05/26/22 1546 05/26/22 1641  BP: 103/74 93/67 102/73 103/69  Pulse: 99 96 90 95  Resp: 16 14 19 18   Temp:   99.3 F (37.4 C) 99.4 F (37.4 C)  TempSrc:   Oral Oral  SpO2: 95% 93% 93% 96%  Weight:    100.2 kg  Height:    6\' 1"  (1.854 m)   General:  Appears calm and comfortable and is in NAD Eyes:   EOMI, normal lids, iris ENT:  grossly normal hearing, lips & tongue, mmm Neck:  no LAD, masses or thyromegaly Cardiovascular:  RRR, no m/r/g. No LE edema.  Respiratory:   CTA bilaterally with no wheezes/rales/rhonchi.  Normal respiratory effort. Abdomen:  soft, NT, ND Back:   normal alignment, no CVAT Skin:  no rash or induration seen on limited exam Musculoskeletal:  grossly normal tone BUE/BLE, good ROM, no bony abnormality Psychiatric:  grossly normal mood and affect, speech fluent and appropriate, AOx3 Neurologic:  CN 2-12 grossly intact, moves all extremities in coordinated fashion   Radiological Exams on Admission: Independently reviewed - see discussion in A/P where applicable  DG Chest Portable 1 View  Result Date: 05/26/2022 CLINICAL DATA:  Shortness of breath and chest pain. History of coronary artery disease, asthma, and hypertension. EXAM: PORTABLE CHEST 1 VIEW COMPARISON:  Chest radiographs 02/05/2021 and 01/06/2018 FINDINGS: Cardiac silhouette is again at the upper limits of normal size for AP technique. There is again a mildly tortuous thoracic aorta. Mild atherosclerotic calcifications. Mildly decreased lung volumes. The lungs are clear. No pleural effusion or  pneumothorax. ACDF hardware overlies the lower cervical spine. IMPRESSION: Mildly decreased lung volumes. No acute cardiopulmonary process. Electronically Signed   By: Neita Garnet  M.D.   On: 05/26/2022 10:06    EKG: Independently reviewed.   1610 - Sinus tachycardia with rate 107; prolonged QTc 592; RBBB; nonspecific ST changes  0936 - Sinus tachycardia with rate 101; RBBB; nonspecific ST changes    Labs on Admission: I have personally reviewed the available labs and imaging studies at the time of the admission.  Pertinent labs:    Glucose 127 HS troponin 6, 6 BNP 33.4 Normal CBC COVID/flu/RSV negative   Assessment and Plan: Principal Problem:   Chest pain Active Problems:   Hyperlipidemia with target LDL less than 70   HTN (hypertension)   History of myocardial infarction   Parkinson's disease   Epigastric abdominal pain    Chest pain with h/o CAD -Patient with acute onset of substernal/epigastric pain that radiates into his back; it has been continuous since it started -He does describe this as similar to prior index symptoms -Last cath (01/2021) with diffuse small vessel CAD, medically treated -He was started on heparin at [facility] but will be discontinued at this time -CXR unremarkable.   -Initial cardiac HS troponin negative with negative delta  -EKG not indicative of STEMI -Will plan to place in observation status on telemetry to rule out ACS by overnight observation.  -Continue ASA 81 mg daily, Plavix -Cardiology consultation requested; he is planned for nuclear stress test tomorrow AM -Continue mexiletine  Epigastric pain -Patient does have h/o GERD; recent unremarkable EGD (and C-scope) -His pain is epigastric in nature with some nausea -Will check lipase -If ongoing pain and cardiac evaluation is negative, consider abdominal CT -Will start Protonix IV q24h for now  HTN -Will hold Toprol XL in anticipation of stress test   -Will also add prn hydralazine  HLD -Continue Crestor -Check lipid panel  PreDM -Check A1c -There is no indication to start medication at this time  Parkinson's -Continue Sinemet, ropinirole,  escitalopram  Allergy/asthma -Continue Dysmista, Xyzal, Singulair  BPH with h/o prostate CA -Continue finasteride, tamsulosin -Remote h/o prostate CA    Advance Care Planning:   Code Status: Full Code - Code status was discussed with the patient and wife at the time of admission.  The patient would want to receive full resuscitative measures at this time.   Consults: Cardiology  DVT Prophylaxis: Lovenox  Family Communication: Wife was present for the majority of the encounter  Severity of Illness: The appropriate patient status for this patient is OBSERVATION. Observation status is judged to be reasonable and necessary in order to provide the required intensity of service to ensure the patient's safety. The patient's presenting symptoms, physical exam findings, and initial radiographic and laboratory data in the context of their medical condition is felt to place them at decreased risk for further clinical deterioration. Furthermore, it is anticipated that the patient will be medically stable for discharge from the hospital within 2 midnights of admission.   Author: Jonah Blue, MD 05/26/2022 6:33 PM  For on call review [email or web address].

## 2022-05-26 NOTE — ED Triage Notes (Signed)
Pt here from home with c/o sob and pain between his shoulder blades , some chest tightness

## 2022-05-26 NOTE — Progress Notes (Signed)
MD present in room  

## 2022-05-26 NOTE — ED Notes (Signed)
Report given to [facility]. 

## 2022-05-26 NOTE — Progress Notes (Signed)
Successful contact with patient's wife. She was informed patient has arrived to  [address]

## 2022-05-26 NOTE — Progress Notes (Signed)
Patient informed of diet order. Menu provided. Wife at bedside calling to place dinner order. Phlebotomy at bedside to collect labs.

## 2022-05-26 NOTE — Progress Notes (Signed)
ANTICOAGULATION CONSULT NOTE - Initial Consult  Pharmacy Consult for heparin Indication: chest pain/ACS  Allergies  Allergen Reactions   Floxin I.V. In Dextrose 5% [Ofloxacin] Other (See Comments)    Lowers BP Lowers BP   Terazosin Other (See Comments)    Lower bp    Patient Measurements: Height: 6\' 1"  (185.4 cm) Weight: 102.9 kg (226 lb 12.8 oz) IBW/kg (Calculated) : 79.9 Heparin Dosing Weight: 100.8kg  Vital Signs: Temp: 97.9 F (36.6 C) (05/14 0933) BP: 108/73 (05/14 1030) Pulse Rate: 102 (05/14 1030)  Labs: Recent Labs    05/26/22 0933  HGB 15.7  HCT 46.4  PLT 197  CREATININE 0.78  TROPONINIHS 6    Estimated Creatinine Clearance: 100.5 mL/min (by C-G formula based on SCr of 0.78 mg/dL).   Medical History: Past Medical History:  Diagnosis Date   Allergic rhinitis    Anxiety    from chronic pain from surgery- on Cymbalta   Arthritis    Asthma    Barrett's esophagus 03/29/2014   Carotid artery disease (HCC)    Carotid Doppler normal August, 2007   Diverticulosis    Dyslipidemia    GERD (gastroesophageal reflux disease)    History of loop recorder    has since 04/04/15   HTN (hypertension)    takes Metoprolol for PVC control   Hx of colonic polyps    adenomatous   IFG (impaired fasting glucose)    Palpitations    Benign PVCs   Parkinson disease    Prostate cancer (HCC)    RBBB (right bundle branch block)    rate related   Shingles    TIA (transient ischemic attack) 02/2015   Per pt, had 2 strokes   Vertigo     Medications:  Infusions:   heparin      Assessment: 76 yom presented to the ED with CP. To start IV heparin. Baseline CBC is WNL and he is not on anticoagulation PTA.   Goal of Therapy:  Heparin level 0.3-0.7 units/ml Monitor platelets by anticoagulation protocol: Yes   Plan:  Heparin bolus 4000 units IV x 1 Heparin gtt 1200 units/hr Check an 8 hr heparin level Daily heparin level and CBC  Basheer Molchan, Drake Leach 05/26/2022,11:11 AM

## 2022-05-26 NOTE — Progress Notes (Signed)
Plan of Care Note for accepted transfer   Patient: Joshua [name] MRN: [id number]   DOA: 05/26/2022  Facility requesting transfer: [facility] Requesting Provider: Nanvati Reason for transfer: chest pain  Facility course: Patient with h/o Parkinson's, prostate CA, HLD, HTN, preDM, and TIA presenting with chest pain and SOB.  CP started about midnight, cardiology will consult.  Troponin 6, EKG abnormal but not STEMI.  Still having chest pain.  Not starting heparin.   Plan of care: The patient is accepted for admission to Telemetry unit, at [facility].   Author: Jonah Blue, MD 05/26/2022  Check [email or web address] for on-call coverage.  Nursing staff, Please call [facility] Admits & Consults System-Wide number on Amion as soon as patient's arrival, so appropriate admitting provider can evaluate the pt.

## 2022-05-26 NOTE — ED Provider Notes (Cosign Needed Addendum)
[facility] Provider Note   CSN: [id number] Arrival date & time: 05/26/22  4098     History  Chief Complaint  Patient presents with   Shortness of Breath    Joshua [name] is a 77 y.o. male history of NSTEMI, CAD, right bundle branch block, Parkinson's, stroke, PVCs, prostate cancer presented with a few hours of substernal chest pain that radiates to the back.  Patient states that it feels like it burning sensation in his epigastric region involving his substernal chest pain that radiates to between her shoulder blades.  Patient states this feels similar to his NSTEMI.  Patient states that he feels unwell but denies the chest pain radiating and denies any alleviating or aggravating symptoms.  Patient states he has not taken his blood pressure medications this morning as he does not feel well but has been able to walk and denies any new onset weakness or changes sensation.  Patient does note he feels short of breath with this chest pain but denies any travel, rehospitalizations/surgeries, hemoptysis, leg swelling, history of aortic aneurysm/dissection.  Patient is currently not anticoagulated.  Last cath: 02/05/2021: LVEF 35 to 45%, stenosis noted in all arteries from 30% to 80%  Home Medications Prior to Admission medications   Medication Sig Start Date End Date Taking? Authorizing Provider  acetaminophen (TYLENOL) 500 MG tablet Take 1,000 mg by mouth in the morning and at bedtime.    [provider]  AMBULATORY NON FORMULARY MEDICATION Dispense 1 electric scooter DX G20.A 1 05/21/22   Tat, Octaviano Batty, DO  ASPIRIN LOW DOSE 81 MG tablet TAKE 1 TABLET BY MOUTH EVERY DAY 02/19/22   Jake Bathe, MD  Biotin 11914 MCG TABS Take 1,000 mcg by mouth in the morning.    [provider]  carbidopa-levodopa (SINEMET CR) 50-200 MG tablet TAKE 1 TABLET BY MOUTH EVERYDAY AT BEDTIME 12/12/21   Tat, Rebecca S, DO  carbidopa-levodopa (SINEMET  IR) 25-100 MG tablet 2 TABLETS AT 7 AM, 2 TABLETS AT 10 AM,2 TABLETS AT 1PM,2 TABLETS AT 4 PM /2 TABLETS AT 7 pm 05/18/22   Tat, Octaviano Batty, DO  cholecalciferol (VITAMIN D3) 25 MCG (1000 UNIT) tablet Take 1,000 Units by mouth daily.    [provider]  clopidogrel (PLAVIX) 75 MG tablet Take 1 tablet (75 mg total) by mouth daily. 03/07/15   Meredeth Ide, MD  Cyanocobalamin (VITAMIN B 12 PO) Take 1,000 mcg by mouth daily.    [provider]  DYMISTA 137-50 MCG/ACT SUSP Place 1 puff into both nostrils at bedtime.  03/24/11   [provider]  escitalopram (LEXAPRO) 10 MG tablet Take 20 mg by mouth daily. 07/21/18   [provider]  finasteride (PROSCAR) 5 MG tablet Take 5 mg by mouth every evening. 04/17/11   [provider]  lansoprazole (PREVACID) 30 MG capsule Take 30 mg by mouth 2 (two) times daily. Morning & mid-afternoon 11/12/19   [provider]  levocetirizine (XYZAL) 5 MG tablet Take 5 mg by mouth every evening.      [provider]  metoprolol succinate (TOPROL-XL) 25 MG 24 hr tablet TAKE 1/2 TABLET BY MOUTH DAILY 06/24/21   Laverda Page B, NP  mexiletine (MEXITIL) 150 MG capsule Take 1 capsule (150 mg total) by mouth 2 (two) times daily. 01/06/22   Camnitz, Will Daphine Deutscher, MD  midodrine (PROAMATINE) 5 MG tablet TAKE 1 TABLET BY MOUTH 2 TIMES DAILY AS NEEDED. TAKE IF SYSTOLIC  BLOOD PRESSURE GETS BELOW 100 08/27/21   Camnitz, Will Daphine Deutscher, MD  montelukast (SINGULAIR) 10 MG tablet Take 10 mg by mouth at bedtime.      [provider]  nitroGLYCERIN (NITROSTAT) 0.4 MG SL tablet Place 1 tablet (0.4 mg total) under the tongue every 5 (five) minutes as needed for chest pain (Do not give more than 3 SL tablets in 15 minutes.). 02/07/21   Arty Baumgartner, NP  Omega-3 Fatty Acids (FISH OIL) 1000 MG CAPS Take 1,000 mg by mouth in the morning.    [provider]  PROCTOSOL HC 2.5 % rectal cream APPLY INTO AND AROUND RECTUM 2 TIMES A  DAY 02/12/17   Hilarie Fredrickson, MD  rOPINIRole (REQUIP) 0.25 MG tablet 1 po tid for 1 week, then 2 po tid, then 3 tabs po tid x 1 week 05/21/22   Tat, Octaviano Batty, DO  rOPINIRole (REQUIP) 1 MG tablet Take 1 tablet (1 mg total) by mouth 3 (three) times daily. 05/21/22   Tat, Octaviano Batty, DO  rosuvastatin (CRESTOR) 20 MG tablet Take 1 tablet (20 mg total) by mouth at bedtime. 02/07/21   Arty Baumgartner, NP  tamsulosin (FLOMAX) 0.4 MG CAPS capsule Take 0.4 mg by mouth daily after supper.  05/25/13   [provider]  traMADol (ULTRAM) 50 MG tablet Take 50 mg by mouth every 6 (six) hours as needed. for pain 09/28/17   [provider]  zolpidem (AMBIEN) 10 MG tablet Take 10 mg by mouth at bedtime. 05/13/11   [provider]      Allergies    Floxin i.v. in dextrose 5% [ofloxacin] and Terazosin    Review of Systems   Review of Systems  Respiratory:  Positive for shortness of breath.     Physical Exam Updated Vital Signs BP 108/73   Pulse (!) 102   Temp 97.9 F (36.6 C)   Resp 17   SpO2 94%  Physical Exam Vitals reviewed.  Constitutional:      General: He is not in acute distress.    Comments: Tremors secondary to Parkinson's  HENT:     Head: Normocephalic and atraumatic.  Eyes:     Extraocular Movements: Extraocular movements intact.     Conjunctiva/sclera: Conjunctivae normal.     Pupils: Pupils are equal, round, and reactive to light.  Cardiovascular:     Rate and Rhythm: Normal rate and regular rhythm.     Pulses: Normal pulses.     Heart sounds: Normal heart sounds.     Comments: 2+ bilateral radial/dorsalis pedis pulses with regular rate Pulmonary:     Effort: Pulmonary effort is normal. No respiratory distress.     Breath sounds: Normal breath sounds.  Abdominal:     Palpations: Abdomen is soft.     Tenderness: There is no abdominal tenderness. There is no guarding or rebound.  Musculoskeletal:        General: Normal range of motion.     Cervical back:  Normal range of motion and neck supple.     Right lower leg: No edema.     Left lower leg: No edema.     Comments: 5 out of 5 bilateral grip/leg extension strength  Skin:    General: Skin is warm and dry.     Capillary Refill: Capillary refill takes less than 2 seconds.  Neurological:     General: No focal deficit present.     Mental Status: He is alert and oriented to  person, place, and time.     Comments: Sensation intact in all 4 limbs  Psychiatric:        Mood and Affect: Mood normal.     ED Results / Procedures / Treatments   Labs (all labs ordered are listed, but only abnormal results are displayed) Labs Reviewed  BASIC METABOLIC PANEL - Abnormal; Notable for the following components:      Result Value   CO2 21 (*)    Glucose, Bld 127 (*)    All other components within normal limits  CBC  BRAIN NATRIURETIC PEPTIDE  TROPONIN I (HIGH SENSITIVITY)    EKG EKG Interpretation  Date/Time:  Tuesday May 26 2022 09:29:47 EDT Ventricular Rate:  107 PR Interval:  204 QRS Duration: 132 QT Interval:  444 QTC Calculation: 592 R Axis:   -26 Text Interpretation: Right bundle branch block Inferolateral injury pattern Abnormal ECG When compared with ECG of 07-Feb-2021 04:57, Premature ventricular complexes are no longer Present Vent. rate has increased BY  36 BPM QRS axis Shifted left repeat ordered Confirmed by Derwood Kaplan [phone]) on 05/26/2022 9:45:32 AM   EKG Interpretation  Date/Time:  Tuesday May 26 2022 09:37:50 EDT Ventricular Rate:  101 PR Interval:  194 QRS Duration: 136 QT Interval:  359 QTC Calculation: 466 R Axis:   20 Text Interpretation: Sinus tachycardia Right bundle branch block subtle ST elevation in inferior leads lateral leads seem to have St depresison Confirmed by Derwood Kaplan [phone]) on 05/26/2022 9:48:37 AM         Radiology DG Chest Portable 1 View  Result Date: 05/26/2022 CLINICAL DATA:  Shortness of breath and chest pain. History of  coronary artery disease, asthma, and hypertension. EXAM: PORTABLE CHEST 1 VIEW COMPARISON:  Chest radiographs 02/05/2021 and 01/06/2018 FINDINGS: Cardiac silhouette is again at the upper limits of normal size for AP technique. There is again a mildly tortuous thoracic aorta. Mild atherosclerotic calcifications. Mildly decreased lung volumes. The lungs are clear. No pleural effusion or pneumothorax. ACDF hardware overlies the lower cervical spine. IMPRESSION: Mildly decreased lung volumes. No acute cardiopulmonary process. Electronically Signed   By: Neita Garnet M.D.   On: 05/26/2022 10:06    Procedures .Critical Care  Performed by: Netta Corrigan, PA-C Authorized by: Netta Corrigan, PA-C   Critical care provider statement:    Critical care time (minutes):  30   Critical care was necessary to treat or prevent imminent or life-threatening deterioration of the following conditions:  Circulatory failure   Critical care was time spent personally by me on the following activities:  Development of treatment plan with patient or surrogate, discussions with consultants, evaluation of patient's response to treatment, examination of patient, obtaining history from patient or surrogate, review of old charts, re-evaluation of patient's condition, pulse oximetry, ordering and review of radiographic studies, ordering and review of laboratory studies and ordering and performing treatments and interventions   I assumed direction of critical care for this patient from another provider in my specialty: no     Care discussed with: admitting provider       Medications Ordered in ED Medications  aspirin EC tablet 325 mg (has no administration in time range)    ED Course/ Medical Decision Making/ A&P                             Medical Decision Making Amount and/or Complexity of Data Reviewed Labs: ordered. Radiology: ordered.  Risk OTC drugs. Decision regarding hospitalization.   Joshua [name] 77 y.o. presented today for chest pain. Working DDx that I considered at this time includes, but not limited to, ACS, GERD, pulmonary embolism, community-acquired pneumonia, aortic dissection, pneumothorax, underlying bony abnormality, anemia, thyrotoxicosis, esophageal rupture.    R/o Dx: GERD, pulmonary embolism, community-acquired pneumonia, aortic dissection, pneumothorax, underlying bony abnormality, anemia, thyrotoxicosis, esophageal rupture: These are considered less likely due to history of present illness and physical exam findings. PE: Symptoms are not consistent with this diagnosis Aortic Dissection: less likely based on the location, quality, onset, and severity of symptoms in this case. Patient also has a lack of underlying history of AD or TAA.   Review of prior external notes: 05/21/2022 office visit  Unique Tests and My Interpretation:  EKG: Rate, rhythm, axis, intervals all examined and without medically relevant abnormality. ST segments without concerns for elevations Troponin: 6 CXR: Decreased lung volumes, no acute cardiopulmonary changes CBC: Unremarkable BMP: Unremarkable  Discussion with Independent Historian:  Wife  Discussion of Management of Tests:  Bjorn Pippin, MD Cardiologist ; Ophelia Charter, MD Hospitalist  Risk: High: hospitalization or escalation of hospital-level care  Risk Stratification Score: None  Staffed with Rhunette Croft, MD  Plan: Patient presented for chest pain. On exam patient was in no acute distress but did have initially a blood pressure of 220/212 and tachycardic at 108.  Patient stated that he not taking his blood pressure meds this morning. Patient's physical was unremarkable.  Patient had good pulse motor sensation in all 4 limbs and I did not appreciate any murmur or adventitious lung sounds.  Patient was nontender abdominally and did not appear in distress.  Patient did note symptoms he is feeling right now is similar to when he had his NSTEMI.   Labs and CXR will be ordered.  RN stated before we gave blood pressure meds that his blood pressure was 115 and the other arm and so blood pressure will be repeated before Lopressor was given. patient stable at this time.  I spoke to the cardiologist who stated that EKG does not appear to have NSTEMI/STEMI criteria at this time and to monitor patient's troponin as the first troponin would be after 4 hours and it should be elevated at this point.  Patient's first troponin was 6.  After speak with the attending we both agreed patient needed to be admitted for observation due to significant cardiac history and stenosis found on previous cath.  Cardiology said that they would consult and the hospitalist can admit.  Hospitalist will be consulted.  Patient updated of this plan verbalized agreement.  Patient also be given aspirin along with a heparin drip.  I spoke to the hospitalist and patient was accepted for admission.  Patient stable for admission at this time.   Final Clinical Impression(s) / ED Diagnoses Final diagnoses:  Chest pain, unspecified type    Rx / DC Orders ED Discharge Orders     None         Remi Deter 05/26/22 1455    Derwood Kaplan, MD 05/27/22 1106

## 2022-05-26 NOTE — Plan of Care (Signed)

## 2022-05-26 NOTE — Consult Note (Signed)
CARDIOLOGY CONSULT NOTE       Patient ID: Joshua [name] MRN: [id number] DOB/AGE: 06-09-1945 77 y.o.  Admit date: 05/26/2022 Referring Physician: Ophelia Charter Primary Physician: [name], MD Primary Cardiologist: Skains/Camnitz Reason for Consultation: Chest pain  Principal Problem:   Chest pain   HPI:  77 y.o. transferred from [facility] ER for chest pain. Had SEMI January 2023 Cath showed small vessel diagonal/OM and distal LAD dx Rx medically TTE 02/06/21 showed normalization of EF. Has seen Dr Elberta Fortis for PVCls suppressed with mexitil He has hard long standing parkinson's and sees Dr Tat. He is on sinemet, pro amantine and requip for this. Did some yard work yesterday and woke up at 3:00 am with epigastric/chest burning / pain radiating to back. Still some bothersome Antacids did not help No nitro given. Has has loose stools and diarrhea today No nausea/vomiting His has had his GB removed No ETOH In ER ECG non acute with ICRBBB, troponin negative x 2 and CXR normal with normal mediastinum. He was started on protonix   ROS All other systems reviewed and negative except as noted above  Past Medical History:  Diagnosis Date   Allergic rhinitis    Anxiety    from chronic pain from surgery- on Cymbalta   Arthritis    Asthma    Barrett's esophagus 03/29/2014   Carotid artery disease (HCC)    Carotid Doppler normal August, 2007   Diverticulosis    Dyslipidemia    GERD (gastroesophageal reflux disease)    History of loop recorder    has since 04/04/15   HTN (hypertension)    takes Metoprolol for PVC control   Hx of colonic polyps    adenomatous   IFG (impaired fasting glucose)    Palpitations    Benign PVCs   Parkinson disease    Prostate cancer (HCC)    RBBB (right bundle branch block)    rate related   Shingles    TIA (transient ischemic attack) 02/2015   Per pt, had 2 strokes   Vertigo     Family History  Problem Relation Age of Onset   Hypertension Mother     Heart attack Father    Heart disease Father    Clotting disorder Father    Heart disease Brother    Lung cancer Brother    Prostate cancer Brother    Breast cancer Paternal Grandmother    Heart attack Paternal Grandfather    Healthy Daughter    Healthy Daughter    Esophageal cancer Neg Hx    Stomach cancer Neg Hx    Rectal cancer Neg Hx    Colon polyps Neg Hx     Social History   Socioeconomic History   Marital status: Married    Spouse name: [name]    Number of children: 2   Years of education: 15   Highest education level: Some college, no degree  Occupational History   Occupation: Retired    Comment: Social research officer, government  Tobacco Use   Smoking status: Never   Smokeless tobacco: Never  Building services engineer Use: Never used  Substance and Sexual Activity   Alcohol use: No    Alcohol/week: 0.0 standard drinks of alcohol   Drug use: No   Sexual activity: Not Currently  Other Topics Concern   Not on file  Social History Narrative   Lives with wife.    Caffeine use: Coffee/tea/soda- ocassionally   Right-handed.   Retired  Live in one story home   Social Determinants of Health   Financial Resource Strain: Not on file  Food Insecurity: No Food Insecurity (05/26/2022)   Hunger Vital Sign    Worried About Running Out of Food in the Last Year: Never true    Ran Out of Food in the Last Year: Never true  Transportation Needs: No Transportation Needs (05/26/2022)   PRAPARE - Administrator, Civil Service (Medical): No    Lack of Transportation (Non-Medical): No  Physical Activity: Not on file  Stress: Not on file  Social Connections: Not on file  Intimate Partner Violence: Not At Risk (05/26/2022)   Humiliation, Afraid, Rape, and Kick questionnaire    Fear of Current or Ex-Partner: No    Emotionally Abused: No    Physically Abused: No    Sexually Abused: No    Past Surgical History:  Procedure Laterality Date   BACK SURGERY  2002,2009   x 6    CHOLECYSTECTOMY  11/30/2012   with IOC   COLONOSCOPY     ELECTROPHYSIOLOGIC STUDY N/A 09/26/2015   Procedure: V Tach Ablation (PVC);  Surgeon: Will Jorja Loa, MD;  Location: [facility];  Service: Cardiovascular;  Laterality: N/A;   EP IMPLANTABLE DEVICE N/A 04/04/2015   Procedure: Loop Recorder Insertion;  Surgeon: Will Jorja Loa, MD;  Location: [facility];  Service: Cardiovascular;  Laterality: N/A;   HERNIA REPAIR     laprascopic   KNEE SURGERY     DECEMBER 2017,LEFT KNEE SCOPED   LEFT HEART CATH AND CORONARY ANGIOGRAPHY N/A 02/05/2021   Procedure: LEFT HEART CATH AND CORONARY ANGIOGRAPHY;  Surgeon: Iran Ouch, MD;  Location: [facility];  Service: Cardiovascular;  Laterality: N/A;   NECK SURGERY  2002   POLYPECTOMY     ROTATOR CUFF REPAIR Left    TEE WITHOUT CARDIOVERSION N/A 04/04/2015   Procedure: TRANSESOPHAGEAL ECHOCARDIOGRAM (TEE);  Surgeon: Lewayne Bunting, MD;  Location: [facility] ENDOSCOPY;  Service: Cardiovascular;  Laterality: N/A;   TRIGGER FINGER RELEASE Left 12/28/2019   Procedure: RELEASE TRIGGER FINGER/A-1 PULLEY THUMB, MIDDLE AND RING;  Surgeon: Cindee Salt, MD;  Location: [facility];  Service: Orthopedics;  Laterality: Left;  FAB   UPPER GASTROINTESTINAL ENDOSCOPY     V Tach ablation  09/26/2015      Current Facility-Administered Medications:    heparin ADULT infusion 100 units/mL (25000 units/223mL), 1,200 Units/hr, Intravenous, Continuous, Rumbarger, Faye Ramsay, RPH, Last Rate: 12 mL/hr at 05/26/22 1120, 1,200 Units/hr at 05/26/22 1120   pantoprazole (PROTONIX) injection 40 mg, 40 mg, Intravenous, Q24H, Jonah Blue, MD  pantoprazole (PROTONIX) IV  40 mg Intravenous Q24H    heparin 1,200 Units/hr (05/26/22 1120)    Physical Exam: Blood pressure 103/69, pulse 95, temperature 99.4 F (37.4 C), temperature source Oral, resp. rate 18, height 6\' 1"  (1.854 m), weight 100.2 kg, SpO2 96 %.  Affect appropriate Healthy:   appears stated age HEENT: normal Neck supple with no adenopathy JVP normal no bruits no thyromegaly Lungs clear with no wheezing and good diaphragmatic motion Heart:  S1/S2 no murmur, no rub, gallop or click PMI normal Abdomen: benighn, BS positve, no tenderness, no AAA no bruit.  No HSM or HJR Distal pulses intact with no bruits No edema Neuro non-focal UE tremors from Parkinson's  Skin warm and dry No muscular weakness   Labs:   Lab Results  Component Value Date   WBC 5.1 05/26/2022   HGB  15.7 05/26/2022   HCT 46.4 05/26/2022   MCV 96.9 05/26/2022   PLT 197 05/26/2022    Recent Labs  Lab 05/26/22 0933  NA 137  K 4.0  CL 104  CO2 21*  BUN 20  CREATININE 0.78  CALCIUM 9.1  GLUCOSE 127*   No results found for: "CKTOTAL", "CKMB", "CKMBINDEX", "TROPONINI"  Lab Results  Component Value Date   CHOL 123 02/07/2021   CHOL 133 02/06/2021   CHOL 137 03/06/2015   Lab Results  Component Value Date   HDL 46 02/07/2021   HDL 49 02/06/2021   HDL 41 03/06/2015   Lab Results  Component Value Date   LDLCALC 56 02/07/2021   LDLCALC 64 02/06/2021   LDLCALC 75 03/06/2015   Lab Results  Component Value Date   TRIG 103 02/07/2021   TRIG 102 02/06/2021   TRIG 105 03/06/2015   Lab Results  Component Value Date   CHOLHDL 2.7 02/07/2021   CHOLHDL 2.7 02/06/2021   CHOLHDL 3.3 03/06/2015   No results found for: "LDLDIRECT"    Radiology: DG Chest Portable 1 View  Result Date: 05/26/2022 CLINICAL DATA:  Shortness of breath and chest pain. History of coronary artery disease, asthma, and hypertension. EXAM: PORTABLE CHEST 1 VIEW COMPARISON:  Chest radiographs 02/05/2021 and 01/06/2018 FINDINGS: Cardiac silhouette is again at the upper limits of normal size for AP technique. There is again a mildly tortuous thoracic aorta. Mild atherosclerotic calcifications. Mildly decreased lung volumes. The lungs are clear. No pleural effusion or pneumothorax. ACDF hardware overlies the  lower cervical spine. IMPRESSION: Mildly decreased lung volumes. No acute cardiopulmonary process. Electronically Signed   By: Neita Garnet M.D.   On: 05/26/2022 10:06    EKG: SR ICRBBB non acute   ASSESSMENT AND PLAN:   Chest Pain:  atypical with GI overtones Not like his chest pain when he had SEMI in January 2023. Cath with known small vessel dx in Diagonals/OM and distal LAD not ideal for intervention. No need for heparin with non acute ECG and negative troponin Dr Ophelia Charter to d/c Rx with proton pump inhibiter consider stool culture and abdominal imaging. Shared decision making will risk stratify with lexiscan myovue in am . No indication for cath at this time  Parkinson's continue home meds f/u Dr Tat PVC;s non on monitor or ECG continue Melitil EF had returned to normal on TTE 02/06/21   Signed: Charlton Haws 05/26/2022, 5:59 PM

## 2022-05-26 NOTE — ED Notes (Signed)
ED TO INPATIENT HANDOFF REPORT  ED Nurse Name and Phone #:    S Name/Age/Gender Joshua [name] 77 y.o. male Room/Bed: DB002/DB002  Code Status   Code Status: Prior  Home/SNF/Other Home Patient oriented to: self, place, time, and situation Is this baseline? Yes   Triage Complete: Triage complete  Chief Complaint Chest pain [R07.9]  Triage Note Pt here from home with c/o sob and pain between his shoulder blades , some chest tightness   Allergies Allergies  Allergen Reactions   Floxin I.V. In Dextrose 5% [Ofloxacin] Other (See Comments)    Lowers BP Lowers BP   Terazosin Other (See Comments)    Lower bp    Level of Care/Admitting Diagnosis ED Disposition     ED Disposition  Admit   Condition  --   Comment  Hospital Area: [facility] [[id number]]  Level of Care: Telemetry Cardiac [[id number]]  Interfacility transfer: Yes  May place patient in observation at [facility] or [facility] if equivalent level of care is available:: No  Covid Evaluation: Asymptomatic - no recent exposure (last 10 days) testing not required  Diagnosis: Chest pain [409811]  Admitting Physician: Jonah Blue [[id number]]  Attending Physician: Jonah Blue [[id number]]          B Medical/Surgery History Past Medical History:  Diagnosis Date   Allergic rhinitis    Anxiety    from chronic pain from surgery- on Cymbalta   Arthritis    Asthma    Barrett's esophagus 03/29/2014   Carotid artery disease (HCC)    Carotid Doppler normal August, 2007   Diverticulosis    Dyslipidemia    GERD (gastroesophageal reflux disease)    History of loop recorder    has since 04/04/15   HTN (hypertension)    takes Metoprolol for PVC control   Hx of colonic polyps    adenomatous   IFG (impaired fasting glucose)    Palpitations    Benign PVCs   Parkinson disease    Prostate cancer (HCC)    RBBB (right bundle branch block)    rate related   Shingles    TIA (transient ischemic  attack) 02/2015   Per pt, had 2 strokes   Vertigo    Past Surgical History:  Procedure Laterality Date   BACK SURGERY  2002,2009   x 6   CHOLECYSTECTOMY  11/30/2012   with IOC   COLONOSCOPY     ELECTROPHYSIOLOGIC STUDY N/A 09/26/2015   Procedure: V Tach Ablation (PVC);  Surgeon: Will Jorja Loa, MD;  Location: [facility] INVASIVE CV LAB;  Service: Cardiovascular;  Laterality: N/A;   EP IMPLANTABLE DEVICE N/A 04/04/2015   Procedure: Loop Recorder Insertion;  Surgeon: Will Jorja Loa, MD;  Location: [facility] INVASIVE CV LAB;  Service: Cardiovascular;  Laterality: N/A;   HERNIA REPAIR     laprascopic   KNEE SURGERY     DECEMBER 2017,LEFT KNEE SCOPED   LEFT HEART CATH AND CORONARY ANGIOGRAPHY N/A 02/05/2021   Procedure: LEFT HEART CATH AND CORONARY ANGIOGRAPHY;  Surgeon: Iran Ouch, MD;  Location: [facility] INVASIVE CV LAB;  Service: Cardiovascular;  Laterality: N/A;   NECK SURGERY  2002   POLYPECTOMY     ROTATOR CUFF REPAIR Left    TEE WITHOUT CARDIOVERSION N/A 04/04/2015   Procedure: TRANSESOPHAGEAL ECHOCARDIOGRAM (TEE);  Surgeon: Lewayne Bunting, MD;  Location: [facility] ENDOSCOPY;  Service: Cardiovascular;  Laterality: N/A;   TRIGGER FINGER RELEASE Left 12/28/2019   Procedure: RELEASE TRIGGER FINGER/A-1 PULLEY THUMB, MIDDLE  AND RING;  Surgeon: Cindee Salt, MD;  Location: [facility];  Service: Orthopedics;  Laterality: Left;  FAB   UPPER GASTROINTESTINAL ENDOSCOPY     V Tach ablation  09/26/2015     A IV Location/Drains/Wounds Patient Lines/Drains/Airways Status     Active Line/Drains/Airways     Name Placement date Placement time Site Days   Peripheral IV 05/26/22 20 G Anterior;Right Forearm 05/26/22  0956  Forearm  less than 1   Incision (Closed) 12/28/19 Hand Left 12/28/19  1326  -- 880            Intake/Output Last 24 hours No intake or output data in the 24 hours ending 05/26/22 1325  Labs/Imaging Results for orders placed or performed during the hospital  encounter of 05/26/22 (from the past 48 hour(s))  Basic metabolic panel     Status: Abnormal   Collection Time: 05/26/22  9:33 AM  Result Value Ref Range   Sodium 137 135 - 145 mmol/L   Potassium 4.0 3.5 - 5.1 mmol/L   Chloride 104 98 - 111 mmol/L   CO2 21 (L) 22 - 32 mmol/L   Glucose, Bld 127 (H) 70 - 99 mg/dL    Comment: Glucose reference range applies only to samples taken after fasting for at least 8 hours.   BUN 20 8 - 23 mg/dL   Creatinine, Ser 1.61 0.61 - 1.24 mg/dL   Calcium 9.1 8.9 - 09.6 mg/dL   GFR, Estimated >04 >54 mL/min    Comment: (NOTE) Calculated using the CKD-EPI Creatinine Equation (2021)    Anion gap 12 5 - 15    Comment: Performed at [facility], [address] [place], [place] [address]  CBC     Status: None   Collection Time: 05/26/22  9:33 AM  Result Value Ref Range   WBC 5.1 4.0 - 10.5 K/uL   RBC 4.79 4.22 - 5.81 MIL/uL   Hemoglobin 15.7 13.0 - 17.0 g/dL   HCT 91.4 78.2 - 95.6 %   MCV 96.9 80.0 - 100.0 fL   MCH 32.8 26.0 - 34.0 pg   MCHC 33.8 30.0 - 36.0 g/dL   RDW 21.3 08.6 - 57.8 %   Platelets 197 150 - 400 K/uL   nRBC 0.0 0.0 - 0.2 %    Comment: Performed at [facility], [address], [place], [place] [address]  Troponin I (High Sensitivity)     Status: None   Collection Time: 05/26/22  9:33 AM  Result Value Ref Range   Troponin I (High Sensitivity) 6 <18 ng/L    Comment: (NOTE) Elevated high sensitivity troponin I (hsTnI) values and significant  changes across serial measurements may suggest ACS but many other  chronic and acute conditions are known to elevate hsTnI results.  Refer to the "Links" section for chest pain algorithms and additional  guidance. Performed at [facility], [address] [place], [place] [address]   Brain natriuretic peptide     Status: None   Collection Time: 05/26/22  9:55 AM  Result Value Ref Range   B Natriuretic Peptide 33.4 0.0 - 100.0 pg/mL     Comment: Performed at [facility], [address] [place], [place] [address]  Troponin I (High Sensitivity)     Status: None   Collection Time: 05/26/22 11:33 AM  Result Value Ref Range   Troponin I (High Sensitivity) 6 <18 ng/L    Comment: (NOTE) Elevated high sensitivity troponin I (hsTnI)  values and significant  changes across serial measurements may suggest ACS but many other  chronic and acute conditions are known to elevate hsTnI results.  Refer to the "Links" section for chest pain algorithms and additional  guidance. Performed at [facility], [address], [place], [place] [address]    DG Chest Portable 1 View  Result Date: 05/26/2022 CLINICAL DATA:  Shortness of breath and chest pain. History of coronary artery disease, asthma, and hypertension. EXAM: PORTABLE CHEST 1 VIEW COMPARISON:  Chest radiographs 02/05/2021 and 01/06/2018 FINDINGS: Cardiac silhouette is again at the upper limits of normal size for AP technique. There is again a mildly tortuous thoracic aorta. Mild atherosclerotic calcifications. Mildly decreased lung volumes. The lungs are clear. No pleural effusion or pneumothorax. ACDF hardware overlies the lower cervical spine. IMPRESSION: Mildly decreased lung volumes. No acute cardiopulmonary process. Electronically Signed   By: Neita Garnet M.D.   On: 05/26/2022 10:06    Pending Labs Unresulted Labs (From admission, onward)     Start     Ordered   05/26/22 1930  Heparin level (unfractionated)  Once-Timed,   URGENT        05/26/22 1108            Vitals/Pain Today's Vitals   05/26/22 1000 05/26/22 1015 05/26/22 1030 05/26/22 1324  BP: 99/80 105/71 108/73   Pulse: (!) 106 (!) 102 (!) 102   Resp: 16 17 17    Temp:    98.3 F (36.8 C)  TempSrc:    Oral  SpO2: 95% 93% 94%   Weight:      Height:      PainSc:        Isolation Precautions No active isolations  Medications Medications  heparin ADULT  infusion 100 units/mL (25000 units/269mL) (1,200 Units/hr Intravenous New Bag/Given 05/26/22 1120)  aspirin EC tablet 325 mg (325 mg Oral Given 05/26/22 1121)  heparin bolus via infusion 4,000 Units (4,000 Units Intravenous Bolus from Bag 05/26/22 1121)    Mobility walks     Focused Assessments Cardiac Assessment Handoff:  Cardiac Rhythm: Normal sinus rhythm No results found for: "CKTOTAL", "CKMB", "CKMBINDEX", "TROPONINI" No results found for: "DDIMER" Does the Patient currently have chest pain? No    R Recommendations: See Admitting Provider Note  Report given to:   Additional Notes:

## 2022-05-26 NOTE — ED Notes (Signed)
Called [facility] to transport patient to [facility] 3E rm# [phone]

## 2022-05-26 NOTE — ED Notes (Signed)
Wife [name] [phone] called.  Would like a call when her husband is transferred to [facility] 3E Rm# [phone]--

## 2022-05-27 ENCOUNTER — Observation Stay (HOSPITAL_BASED_OUTPATIENT_CLINIC_OR_DEPARTMENT_OTHER): Payer: Medicare HMO

## 2022-05-27 DIAGNOSIS — G903 Multi-system degeneration of the autonomic nervous system: Secondary | ICD-10-CM

## 2022-05-27 DIAGNOSIS — I5030 Unspecified diastolic (congestive) heart failure: Secondary | ICD-10-CM | POA: Diagnosis not present

## 2022-05-27 DIAGNOSIS — R079 Chest pain, unspecified: Secondary | ICD-10-CM | POA: Diagnosis not present

## 2022-05-27 DIAGNOSIS — I25110 Atherosclerotic heart disease of native coronary artery with unstable angina pectoris: Secondary | ICD-10-CM | POA: Diagnosis not present

## 2022-05-27 LAB — ECHOCARDIOGRAM COMPLETE
AR max vel: 2.65 cm2
AV Area VTI: 2.65 cm2
AV Area mean vel: 2.52 cm2
AV Mean grad: 4 mmHg
AV Peak grad: 7.4 mmHg
Ao pk vel: 1.36 m/s
Area-P 1/2: 4.63 cm2
Height: 73 in
S' Lateral: 3.3 cm
Weight: 3523.2 oz

## 2022-05-27 LAB — BASIC METABOLIC PANEL
Anion gap: 8 (ref 5–15)
BUN: 22 mg/dL (ref 8–23)
CO2: 22 mmol/L (ref 22–32)
Calcium: 8.5 mg/dL — ABNORMAL LOW (ref 8.9–10.3)
Chloride: 102 mmol/L (ref 98–111)
Creatinine, Ser: 0.96 mg/dL (ref 0.61–1.24)
GFR, Estimated: >60 mL/min (ref >60)
Glucose, Bld: 106 mg/dL — ABNORMAL HIGH (ref 70–99)
Potassium: 3.3 mmol/L — ABNORMAL LOW (ref 3.5–5.1)
Sodium: 132 mmol/L — ABNORMAL LOW (ref 135–145)

## 2022-05-27 LAB — CBC
HCT: 39.7 % (ref 39.0–52.0)
Hemoglobin: 13.2 g/dL (ref 13.0–17.0)
MCH: 32.3 pg (ref 26.0–34.0)
MCHC: 33.2 g/dL (ref 30.0–36.0)
MCV: 97.1 fL (ref 80.0–100.0)
Platelets: 174 10*3/uL (ref 150–400)
RBC: 4.09 MIL/uL — ABNORMAL LOW (ref 4.22–5.81)
RDW: 13.5 % (ref 11.5–15.5)
WBC: 5.5 10*3/uL (ref 4.0–10.5)
nRBC: 0 % (ref 0.0–0.2)

## 2022-05-27 MED ORDER — PERFLUTREN LIPID MICROSPHERE
1.0000 mL | INTRAVENOUS | Status: AC | PRN
  Administered 2022-05-27: 3 mL via INTRAVENOUS

## 2022-05-27 MED ORDER — POTASSIUM CHLORIDE CRYS ER 20 MEQ PO TBCR
40.0000 meq | EXTENDED_RELEASE_TABLET | ORAL | Status: AC
  Administered 2022-05-27: 40 meq via ORAL
  Filled 2022-05-27: qty 2

## 2022-05-27 MED ORDER — ALUM & MAG HYDROXIDE-SIMETH 200-200-20 MG/5ML PO SUSP
15.0000 mL | Freq: Four times a day (QID) | ORAL | Status: DC | PRN
  Administered 2022-05-28: 15 mL via ORAL
  Filled 2022-05-27: qty 30

## 2022-05-27 MED ORDER — LOPERAMIDE HCL 2 MG PO CAPS
2.0000 mg | ORAL_CAPSULE | ORAL | Status: DC | PRN
  Administered 2022-05-27: 2 mg via ORAL
  Filled 2022-05-27: qty 1

## 2022-05-27 MED ORDER — SACCHAROMYCES BOULARDII 250 MG PO CAPS
250.0000 mg | ORAL_CAPSULE | Freq: Two times a day (BID) | ORAL | Status: DC
  Administered 2022-05-27 – 2022-05-31 (×6): 250 mg via ORAL
  Filled 2022-05-27 (×6): qty 1

## 2022-05-27 NOTE — Progress Notes (Addendum)
TRH night cross cover note:   I was notified by RN that the patient is requesting an antimotility agent for a few episodes of loose stool.  It does not sound like the duration or frequency of the patient's loose stool meets criteria for diarrhea, and per my brief chart review, it does not appear that the patient has been recent antibiotics.  In reviewing most recent vital signs, he remains afebrile.  I subsequently placed order for as needed Imodium.  Additionally, RN conveys that the patient is requesting a probiotic.  I subsequent placed order for Florastor twice daily, with first dose now.    Newton Pigg, DO Hospitalist

## 2022-05-27 NOTE — Care Management Obs Status (Signed)
MEDICARE OBSERVATION STATUS NOTIFICATION   Patient Details  Name: Joshua [name] MRN: [id number] Date of Birth: [birth year]   Medicare Observation Status Notification Given:  Yes    Tom-Johnson, Hershal Coria, RN 05/27/2022, 3:51 PM

## 2022-05-27 NOTE — Hospital Course (Addendum)
76 y.o.m w/ Parkinson's, prostate CA, HLD, HTN, preDM, and TIA, NSTEMI in January 2023  w/ Cath showing small vessel diagonal/OM and distal LAD dx treated medically-TTE 02/06/21 showed normalization of EF, history of PVC on mexiletine followed by Dr. Elberta Fortis presentied with chest pain/epigastric pain and shortness of breath since 3 AM after doing yard work earlier in the day. He was recently placed on new medication for Parkinson's about a week ago (ropinirole).  He sees [facility] and had a recent colonoscopy and endoscopy (4/10) EGD with HH, fundic gland polyps, and mild esophageal dilation, pathology was unremarkable. In the ED -vitals with mild tachycardia initial BP 228/212> subsequent blood pressure normal in 90s to 100.labs stable CBC BMP troponin negative x 2 BNP 33. Seen by cardiology admitted for further management Further labs with LDL 23, HDL 46 HbA1c 5.3 TSH 0.8, mild hypokalemia and hyponatremia He was admitted for further workup.  Underwent echocardiogram that showed normal EF/wall motion and cardiology did nuclear perfusion study 5/16 to evaluate further. Nuclear stress test was unremarkable He was noticed to have diarrhea and also abdominal pain and distention CT and x-ray showed SBO NG tube decompression started seen by general surgery kept n.p.o. and managed conservatively, clinically improved , NG tube discontinued 5/18 and diet advanced on FLD> has been having some diarrhea, changed to soft diet 5/19> which he tolerated well at this time ambulating no nausea or vomiting.  He feels improved and okay for discharge surgery has signed off

## 2022-05-27 NOTE — Plan of Care (Signed)
  Problem: Education: Goal: Knowledge of General Education information will improve Description Including pain rating scale, medication(s)/side effects and non-pharmacologic comfort measures Outcome: Progressing   Problem: Activity: Goal: Risk for activity intolerance will decrease Outcome: Progressing   Problem: Safety: Goal: Ability to remain free from injury will improve Outcome: Progressing   

## 2022-05-27 NOTE — Progress Notes (Signed)
Pt. Requesting med for frequent stools. On call for [facility] paged to make aware.

## 2022-05-27 NOTE — Progress Notes (Signed)
PROGRESS NOTE Joshua [name]  ZOX:[id number] DOB: 1945/11/25 DOA: 05/26/2022 PCP: [name], MD  Brief Narrative/Hospital Course: 77 y.o.m w/ Parkinson's, prostate CA, HLD, HTN, preDM, and TIA, NSTEMI in January 2023  w/ Cath showing small vessel diagonal/OM and distal LAD dx treated medically-TTE 02/06/21 showed normalization of EF, history of PVC on mexiletine followed by Dr. Elberta Fortis presentied with chest pain/epigastric pain and shortness of breath since 3 AM after doing yard work earlier in the day. He was recently placed on new medication for Parkinson's about a week ago (ropinirole).  He sees  GI and had a recent colonoscopy and endoscopy (4/10) EGD with HH, fundic gland polyps, and mild esophageal dilation, pathology was unremarkable.  In the ED -vitals with mild tachycardia initial BP 228/212> subsequent blood pressure normal in 90s to 100.labs stable CBC BMP troponin negative x 2 BNP 33. Seen by cardiology admitted for further management Further labs with LDL 23, HDL 46 HbA1c 5.3 TSH 0.8, mild hypokalemia and hyponatremia He was admitted for further workup     Subjective: Seen and examined this morning resting comfortably.  Complains of epigastric pain.    Assessment and Plan:  Chest pain: Symptoms concerning for acute coronary syndrome with history of ST elevation MI in 2023. Echo pending if clear WMA then  plan for cardiac cath but if normal echo then [facility] tomorrow.  Appreciate cardiology input on board.  Epigastric pain with history of GERD recent unremarkable EGD, lipase normal, continue PPI, add Maalox, see plan as above  STEMI in January 2023  w/ Cath showing small vessel diagonal/OM and distal LAD dx treated medically-TTE 02/06/21 showed normalization of EF,   history of PVC on mexiletine followed by Dr. Elberta Fortis   Parkinson's disease: Continue Sinemet ropinirole escitalopram.  BPH/Prostate CA history: Continue finasteride tamsulosin follow-up with  urology  HLD: Continue Crestor LDL stable at 23.   HTN: BP well-controlled.  Not on antihypertensive.  preDM: HbA1c 5.3.  DVT prophylaxis: enoxaparin (LOVENOX) injection 40 mg Start: 05/26/22 2200 Code Status:   Code Status: Full Code Family Communication: plan of care discussed with patient at bedside. Patient status is:  admitted as observation but remains hospitalized for ongoing  because of chest pain evaluation and further workup Level of care: Telemetry Cardiac   Dispo: The patient is from: HOME W/ WIFE             Anticipated disposition: HOME TOMORROW Objective: Vitals last 24 hrs: Vitals:   05/26/22 1641 05/26/22 1953 05/27/22 0046 05/27/22 0535  BP: 103/69 110/74 98/63 110/68  Pulse: 95 94 76 88  Resp: 18 18 18 18   Temp: 99.4 F (37.4 C) 98.8 F (37.1 C) 98.4 F (36.9 C) (!) 97.5 F (36.4 C)  TempSrc: Oral Oral Oral Oral  SpO2: 96% 98% 98% 96%  Weight: 100.2 kg   99.9 kg  Height: 6\' 1"  (1.854 m)      Weight change:   Physical Examination: General exam: alert awake, older than stated age HEENT:Oral mucosa moist, Ear/Nose WNL grossly Respiratory system: bilaterally clear BS, no use of accessory muscle Cardiovascular system: S1 & S2 +, No JVD. Gastrointestinal system: Abdomen soft,NT,ND, BS+ Nervous System:Alert, awake, moving extremities. Extremities: LE edema neg,distal peripheral pulses palpable.  Skin: No rashes,no icterus. MSK: Normal muscle bulk,tone, power  Medications reviewed:  Scheduled Meds:  aspirin EC  81 mg Oral Daily   azelastine  1 spray Each Nare QHS   And   fluticasone  1 spray Each  Nare QHS   carbidopa-levodopa  1 tablet Oral QHS   carbidopa-levodopa  2 tablet Oral 5 times per day   clopidogrel  75 mg Oral Daily   docusate sodium  100 mg Oral BID   enoxaparin (LOVENOX) injection  40 mg Subcutaneous Q24H   escitalopram  20 mg Oral Daily   finasteride  5 mg Oral QPM   loratadine  10 mg Oral QPM   mexiletine  150 mg Oral BID    montelukast  10 mg Oral QHS   pantoprazole (PROTONIX) IV  40 mg Intravenous Q24H   regadenoson  0.4 mg Intravenous Once   rOPINIRole  0.25 mg Oral TID   rosuvastatin  20 mg Oral QHS   sodium chloride flush  3 mL Intravenous Q12H   tamsulosin  0.4 mg Oral QPC supper   zolpidem  5 mg Oral QHS   Continuous Infusions:  lactated ringers 75 mL/hr at 05/27/22 0017    Diet Order             Diet NPO time specified Except for: Sips with Meds  Diet effective midnight                  Intake/Output Summary (Last 24 hours) at 05/27/2022 1253 Last data filed at 05/27/2022 0600 Gross per 24 hour  Intake 646.84 ml  Output --  Net 646.84 ml   Net IO Since Admission: 646.84 mL [05/27/22 1253]  Wt Readings from Last 3 Encounters:  05/27/22 99.9 kg  05/21/22 102.9 kg  04/08/22 101.3 kg     Unresulted Labs (From admission, onward)    None     Data Reviewed: I have personally reviewed following labs and imaging studies CBC: Recent Labs  Lab 05/26/22 0933 05/27/22 0102  WBC 5.1 5.5  HGB 15.7 13.2  HCT 46.4 39.7  MCV 96.9 97.1  PLT 197 174   Basic Metabolic Panel: Recent Labs  Lab 05/26/22 0933 05/27/22 0102  NA 137 132*  K 4.0 3.3*  CL 104 102  CO2 21* 22  GLUCOSE 127* 106*  BUN 20 22  CREATININE 0.78 0.96  CALCIUM 9.1 8.5*  No results for input(s): "AST", "ALT", "ALKPHOS", "BILITOT", "PROT", "ALBUMIN" in the last 168 hours. Recent Labs  Lab 05/26/22 1831  LIPASE 23    Recent Labs    05/26/22 1831  HGBA1C 5.3   CBG: No results for input(s): "GLUCAP" in the last 168 hours. Lipid Profile: Recent Labs    05/26/22 1831  CHOL 85  HDL 46  LDLCALC 23  TRIG 80  CHOLHDL 1.8   Thyroid Function Tests: Recent Labs    05/26/22 1831  TSH 0.893   Sepsis Labs: No results for input(s): "PROCALCITON", "LATICACIDVEN" in the last 168 hours.  Recent Results (from the past 240 hour(s))  Resp panel by RT-PCR (RSV, Flu A&B, Covid) Anterior Nasal Swab     Status:  None   Collection Time: 05/26/22  3:35 PM   Specimen: Anterior Nasal Swab  Result Value Ref Range Status   SARS Coronavirus 2 by RT PCR NEGATIVE NEGATIVE Final    Comment: (NOTE) SARS-CoV-2 target nucleic acids are NOT DETECTED.  The SARS-CoV-2 RNA is generally detectable in upper respiratory specimens during the acute phase of infection. The lowest concentration of SARS-CoV-2 viral copies this assay can detect is 138 copies/mL. A negative result does not preclude SARS-Cov-2 infection and should not be used as the sole basis for treatment or other patient management decisions. A  negative result may occur with  improper specimen collection/handling, submission of specimen other than nasopharyngeal swab, presence of viral mutation(s) within the areas targeted by this assay, and inadequate number of viral copies(<138 copies/mL). A negative result must be combined with clinical observations, patient history, and epidemiological information. The expected result is Negative.  Fact Sheet for Patients:  BloggerCourse.com  Fact Sheet for Healthcare Providers:  SeriousBroker.it  This test is no t yet approved or cleared by the [place] FDA and  has been authorized for detection and/or diagnosis of SARS-CoV-2 by FDA under an Emergency Use Authorization (EUA). This EUA will remain  in effect (meaning this test can be used) for the duration of the COVID-19 declaration under Section 564(b)(1) of the Act, 21 U.S.C.section 360bbb-3(b)(1), unless the authorization is terminated  or revoked sooner.       Influenza A by PCR NEGATIVE NEGATIVE Final   Influenza B by PCR NEGATIVE NEGATIVE Final    Comment: (NOTE) The Xpert Xpress SARS-CoV-2/FLU/RSV plus assay is intended as an aid in the diagnosis of influenza from Nasopharyngeal swab specimens and should not be used as a sole basis for treatment. Nasal washings and aspirates are unacceptable  for Xpert Xpress SARS-CoV-2/FLU/RSV testing.  Fact Sheet for Patients: BloggerCourse.com  Fact Sheet for Healthcare Providers: SeriousBroker.it  This test is not yet approved or cleared by the [place] FDA and has been authorized for detection and/or diagnosis of SARS-CoV-2 by FDA under an Emergency Use Authorization (EUA). This EUA will remain in effect (meaning this test can be used) for the duration of the COVID-19 declaration under Section 564(b)(1) of the Act, 21 U.S.C. section 360bbb-3(b)(1), unless the authorization is terminated or revoked.     Resp Syncytial Virus by PCR NEGATIVE NEGATIVE Final    Comment: (NOTE) Fact Sheet for Patients: BloggerCourse.com  Fact Sheet for Healthcare Providers: SeriousBroker.it  This test is not yet approved or cleared by the [place] FDA and has been authorized for detection and/or diagnosis of SARS-CoV-2 by FDA under an Emergency Use Authorization (EUA). This EUA will remain in effect (meaning this test can be used) for the duration of the COVID-19 declaration under Section 564(b)(1) of the Act, 21 U.S.C. section 360bbb-3(b)(1), unless the authorization is terminated or revoked.  Performed at [facility], [address], [place], [place] [address]     Antimicrobials: Anti-infectives (From admission, onward)    None      Culture/Microbiology No results found for: "SDES", "SPECREQUEST", "CULT", "REPTSTATUS"  Radiology Studies: DG Chest Portable 1 View  Result Date: 05/26/2022 CLINICAL DATA:  Shortness of breath and chest pain. History of coronary artery disease, asthma, and hypertension. EXAM: PORTABLE CHEST 1 VIEW COMPARISON:  Chest radiographs 02/05/2021 and 01/06/2018 FINDINGS: Cardiac silhouette is again at the upper limits of normal size for AP technique. There is again a mildly tortuous  thoracic aorta. Mild atherosclerotic calcifications. Mildly decreased lung volumes. The lungs are clear. No pleural effusion or pneumothorax. ACDF hardware overlies the lower cervical spine. IMPRESSION: Mildly decreased lung volumes. No acute cardiopulmonary process. Electronically Signed   By: Neita Garnet M.D.   On: 05/26/2022 10:06     LOS: 0 days   Lanae Boast, MD [facility]  05/27/2022, 12:53 PM

## 2022-05-27 NOTE — Progress Notes (Signed)
Rounding Note    Patient Name: Joshua [name] Date of Encounter: 05/27/2022  [facility] Cardiologist: Donato Schultz, MD   Subjective   Continues to have a residual burning sensation in the retrosternal area.  This is mild.  He reports that yesterday he had more severe pain radiating through to his back and had shortness of breath which he describes as being similar to his previous presentation with an acute MI in January 2023.  This is different than what was relayed in yesterday's clinical exam.  The discomfort is not what he had as a side effect when he was taking the higher dose of mexiletine. Lipase is normal.  Denies dyspnea, dizziness, other complaints at this time.  Inpatient Medications    Scheduled Meds:  aspirin EC  81 mg Oral Daily   azelastine  1 spray Each Nare QHS   And   fluticasone  1 spray Each Nare QHS   carbidopa-levodopa  1 tablet Oral QHS   carbidopa-levodopa  2 tablet Oral 5 times per day   clopidogrel  75 mg Oral Daily   docusate sodium  100 mg Oral BID   enoxaparin (LOVENOX) injection  40 mg Subcutaneous Q24H   escitalopram  20 mg Oral Daily   finasteride  5 mg Oral QPM   loratadine  10 mg Oral QPM   mexiletine  150 mg Oral BID   montelukast  10 mg Oral QHS   pantoprazole (PROTONIX) IV  40 mg Intravenous Q24H   regadenoson  0.4 mg Intravenous Once   rOPINIRole  0.25 mg Oral TID   rosuvastatin  20 mg Oral QHS   sodium chloride flush  3 mL Intravenous Q12H   tamsulosin  0.4 mg Oral QPC supper   zolpidem  5 mg Oral QHS   Continuous Infusions:  lactated ringers 75 mL/hr at 05/27/22 0017   PRN Meds: acetaminophen **OR** acetaminophen, bisacodyl, hydrALAZINE, ondansetron **OR** ondansetron (ZOFRAN) IV, mouth rinse, polyethylene glycol, traMADol   Vital Signs    Vitals:   05/26/22 1641 05/26/22 1953 05/27/22 0046 05/27/22 0535  BP: 103/69 110/74 98/63 110/68  Pulse: 95 94 76 88  Resp: 18 18 18 18   Temp: 99.4 F (37.4 C) 98.8 F  (37.1 C) 98.4 F (36.9 C) (!) 97.5 F (36.4 C)  TempSrc: Oral Oral Oral Oral  SpO2: 96% 98% 98% 96%  Weight: 100.2 kg   99.9 kg  Height: 6\' 1"  (1.854 m)       Intake/Output Summary (Last 24 hours) at 05/27/2022 1036 Last data filed at 05/27/2022 0600 Gross per 24 hour  Intake 646.84 ml  Output --  Net 646.84 ml      05/27/2022    5:35 AM 05/26/2022    4:41 PM 05/26/2022    9:31 AM  Last 3 Weights  Weight (lbs) 220 lb 3.2 oz 220 lb 14.4 oz 226 lb 12.8 oz  Weight (kg) 99.882 kg 100.2 kg 102.876 kg      Telemetry    NSR - Personally Reviewed  ECG    The initial tracing yesterday shows normal sinus rhythm and right bundle branch block with some subtle, nondiagnostic ST segment elevation in the inferior and lateral leads.  Second tracing shows normal sinus rhythm, right bundle branch block, less prominent ST changes- Personally Reviewed  Also reviewed the tracings from EMS from 02/05/2021 when he reportedly had a "STEMI" but I do not see any ST elevation on those tracings either.  The same pattern  of right bundle branch block is seen.  Physical Exam  Appears well, looks very comfortable GEN: No acute distress.   Neck: No JVD Cardiac: RRR, no murmurs, rubs, or gallops.  Respiratory: Clear to auscultation bilaterally. GI: Soft, nontender, non-distended  MS: No edema; No deformity. Neuro:  Nonfocal  Psych: Normal affect   Labs    High Sensitivity Troponin:   Recent Labs  Lab 05/26/22 0933 05/26/22 1133  TROPONINIHS 6 6     Chemistry Recent Labs  Lab 05/26/22 0933 05/27/22 0102  NA 137 132*  K 4.0 3.3*  CL 104 102  CO2 21* 22  GLUCOSE 127* 106*  BUN 20 22  CREATININE 0.78 0.96  CALCIUM 9.1 8.5*  GFRNONAA >60 >60  ANIONGAP 12 8    Lipids  Recent Labs  Lab 05/26/22 1831  CHOL 85  TRIG 80  HDL 46  LDLCALC 23  CHOLHDL 1.8    Hematology Recent Labs  Lab 05/26/22 0933 05/27/22 0102  WBC 5.1 5.5  RBC 4.79 4.09*  HGB 15.7 13.2  HCT 46.4 39.7  MCV  96.9 97.1  MCH 32.8 32.3  MCHC 33.8 33.2  RDW 13.4 13.5  PLT 197 174   Thyroid  Recent Labs  Lab 05/26/22 1831  TSH 0.893    BNP Recent Labs  Lab 05/26/22 0955  BNP 33.4    DDimer No results for input(s): "DDIMER" in the last 168 hours.   Radiology    DG Chest Portable 1 View  Result Date: 05/26/2022 CLINICAL DATA:  Shortness of breath and chest pain. History of coronary artery disease, asthma, and hypertension. EXAM: PORTABLE CHEST 1 VIEW COMPARISON:  Chest radiographs 02/05/2021 and 01/06/2018 FINDINGS: Cardiac silhouette is again at the upper limits of normal size for AP technique. There is again a mildly tortuous thoracic aorta. Mild atherosclerotic calcifications. Mildly decreased lung volumes. The lungs are clear. No pleural effusion or pneumothorax. ACDF hardware overlies the lower cervical spine. IMPRESSION: Mildly decreased lung volumes. No acute cardiopulmonary process. Electronically Signed   By: Neita Garnet M.D.   On: 05/26/2022 10:06    Cardiac Studies   Echocardiogram 02/06/2021   1. Left ventricular ejection fraction, by estimation, is 60 to 65%. The  left ventricle has normal function. The left ventricle has no regional  wall motion abnormalities. There is mild concentric left ventricular  hypertrophy. Left ventricular diastolic  parameters are consistent with Grade I diastolic dysfunction (impaired  relaxation).   2. Right ventricular systolic function is normal. The right ventricular  size is normal. Tricuspid regurgitation signal is inadequate for assessing  PA pressure.   3. The mitral valve is normal in structure. Trivial mitral valve  regurgitation. No evidence of mitral stenosis.   4. The aortic valve is tricuspid. Aortic valve regurgitation is not  visualized. Aortic valve sclerosis is present, with no evidence of aortic  valve stenosis.   5. Aortic dilatation noted. There is borderline dilatation of the aortic  root, measuring 37 mm. There is  mild dilatation of the ascending aorta,  measuring 38 mm.   6. The inferior vena cava is normal in size with greater than 50%  respiratory variability, suggesting right atrial pressure of 3 mmHg   Cardiac catheterization 02/05/2021    Prox LAD to Mid LAD lesion is 30% stenosed.   1st Diag lesion is 70% stenosed.   Mid LAD-1 lesion is 40% stenosed.   Mid LAD-2 lesion is 60% stenosed.   Dist LAD lesion is 80% stenosed.  Ramus-1 lesion is 60% stenosed.   Ramus-2 lesion is 80% stenosed.   There is moderate left ventricular systolic dysfunction.   LV end diastolic pressure is mildly elevated.   The left ventricular ejection fraction is 35-45% by visual estimate.   1.  No clear culprit is identified for non-ST elevation myocardial infarction with no significant disease affecting the main vessels.  He does have distal LAD stenosis as well as distal ramus stenosis supplying small segments and diagonal disease.  Initially, the LAD was thought to be occluded but in reality, it had separate ostium from the left circumflex and I was able to selectively engage the LAD.   2.  Moderately reduced LV systolic function 5 to 40%.  Possible distal anterior and apical hypokinesis.   Recommendations: Medical therapy with nitroglycerin drip for pain control.  Resume heparin drip 6 hours. Obtain an echocardiogram to evaluate for possible stress-induced cardiomyopathy.   Dominance: Right       Patient Profile     77 y.o. male with known coronary artery disease involving small or distal arterial branches, history of "STEMI" (in January 2023 although on review there is no true ST segment elevation, troponin 11,750), Parkinson's disease with neurogenic orthostatic hypotension, presents with abrupt onset of nocturnal chest discomfort reminiscent of his previous angina, but with low risk biomarkers and ECG.  Assessment & Plan    Symptoms concerning for acute coronary syndrome, but with low risk cardiac  enzymes and no definitive changes on the electrocardiogram. The initial plan was for a nuclear perfusion study today for further restratification, but I was told that the study cannot be completed today and will be delayed until tomorrow. At home he is on appropriate treatment with antiplatelet therapy, beta-blocker (very low-dose due to orthostatic hypotension), highly active statin.  Also on mexiletine for frequent PVCs (none seen on telemetry or heard during exam today) and on midodrine for orthostatic hypotension.  Will check echo today - if there is a clear new wall motion abnormality, will schedule for heart catheterization/angiography/possible PCI. If echo is normal, will wait for Myoview tomorrow.  Consider coronary vasospasm as a cause for his symptoms if the workup is unrevealing. Treatment would be challenging since vasodilators would worsen his orthostatic hypotension.  For questions or updates, please contact [facility] Please consult [email or web address] for contact info under        Signed, Thurmon Fair, MD  05/27/2022, 10:36 AM

## 2022-05-28 ENCOUNTER — Ambulatory Visit: Payer: Medicare HMO | Admitting: Cardiology

## 2022-05-28 ENCOUNTER — Observation Stay (HOSPITAL_COMMUNITY): Payer: Medicare HMO

## 2022-05-28 ENCOUNTER — Telehealth: Payer: Self-pay | Admitting: Anesthesiology

## 2022-05-28 ENCOUNTER — Telehealth: Payer: Self-pay | Admitting: Cardiology

## 2022-05-28 DIAGNOSIS — R079 Chest pain, unspecified: Secondary | ICD-10-CM | POA: Diagnosis not present

## 2022-05-28 DIAGNOSIS — R072 Precordial pain: Secondary | ICD-10-CM | POA: Diagnosis not present

## 2022-05-28 LAB — NM MYOCAR MULTI W/SPECT W/WALL MOTION / EF
Peak HR: 111 {beats}/min
Rest HR: 85 {beats}/min
ST Depression (mm): 0 mm

## 2022-05-28 MED ORDER — POTASSIUM CHLORIDE CRYS ER 20 MEQ PO TBCR: 40.0000 meq | EXTENDED_RELEASE_TABLET | Freq: Once | ORAL | Status: DC

## 2022-05-28 MED ORDER — SODIUM CHLORIDE 0.9 % IV SOLN: INTRAVENOUS | Status: DC

## 2022-05-28 MED ORDER — PANTOPRAZOLE SODIUM 40 MG IV SOLR
40.0000 mg | INTRAVENOUS | Status: DC
  Administered 2022-05-28: 40 mg via INTRAVENOUS
  Filled 2022-05-28: qty 10

## 2022-05-28 MED ORDER — TECHNETIUM TC 99M TETROFOSMIN IV KIT
9.9000 | PACK | Freq: Once | INTRAVENOUS | Status: AC | PRN
  Administered 2022-05-28: 9.9 via INTRAVENOUS

## 2022-05-28 MED ORDER — IOHEXOL 9 MG/ML PO SOLN
500.0000 mL | ORAL | Status: AC
  Administered 2022-05-28 (×2): 500 mL via ORAL

## 2022-05-28 MED ORDER — REGADENOSON 0.4 MG/5ML IV SOLN
INTRAVENOUS | Status: AC
  Filled 2022-05-28: qty 5

## 2022-05-28 MED ORDER — REGADENOSON 0.4 MG/5ML IV SOLN
0.4000 mg | Freq: Once | INTRAVENOUS | Status: DC
  Filled 2022-05-28: qty 5

## 2022-05-28 MED ORDER — TECHNETIUM TC 99M TETROFOSMIN IV KIT
31.4000 | PACK | Freq: Once | INTRAVENOUS | Status: AC | PRN
  Administered 2022-05-28: 31.4 via INTRAVENOUS

## 2022-05-28 MED ORDER — IOHEXOL 350 MG/ML SOLN
75.0000 mL | Freq: Once | INTRAVENOUS | Status: AC | PRN
  Administered 2022-05-28: 75 mL via INTRAVENOUS

## 2022-05-28 MED ORDER — AMINOPHYLLINE 25 MG/ML IV SOLN
INTRAVENOUS | Status: AC
  Filled 2022-05-28: qty 10

## 2022-05-28 MED ORDER — AMINOPHYLLINE 25 MG/ML IV (NUC MED)
75.0000 mg | INTRAVENOUS | Status: AC
  Administered 2022-05-28: 75 mg via INTRAVENOUS

## 2022-05-28 MED ORDER — PANTOPRAZOLE SODIUM 40 MG PO TBEC
40.0000 mg | DELAYED_RELEASE_TABLET | Freq: Every day | ORAL | Status: DC
  Administered 2022-05-28: 40 mg via ORAL
  Filled 2022-05-28: qty 1

## 2022-05-28 MED ORDER — OXYCODONE HCL 5 MG PO TABS
5.0000 mg | ORAL_TABLET | Freq: Four times a day (QID) | ORAL | Status: DC | PRN
  Administered 2022-05-28: 5 mg via ORAL
  Filled 2022-05-28: qty 1

## 2022-05-28 NOTE — Progress Notes (Signed)
The patient has received Lexiscan dose as ordered. His shortness of breath did not improve after the allotted time. 75 mg of Aminophylline was administered. Patient reports being back to baseline. The appropriate staff was present during this test. No acute distress noted.

## 2022-05-28 NOTE — Telephone Encounter (Signed)
Pt's wife left a message stating she has questions about possible side effects regarding pt's new medication.

## 2022-05-28 NOTE — Progress Notes (Addendum)
Nuclear stress test was normal, low risk. Relayed result to IM and patient as well as Dr. Royann Shivers. IM plans to pursue further w/u for possible bowel obstruction therefore discharge disposition will be up to medicine team contigent on their workup. From cardiac standpoint no further workup needed. Have arranged gen cards f/u and placed on AVS. Patient also has EP f/u in June.

## 2022-05-28 NOTE — Telephone Encounter (Signed)
Called patients wife and gave advice

## 2022-05-28 NOTE — Progress Notes (Signed)
Pts. PO medications held d/t pt. Being NPO for small bowel obstruction, per on call MD for [facility].

## 2022-05-28 NOTE — Progress Notes (Signed)
Rounding Note    Patient Name: Joshua [name] Date of Encounter: 05/28/2022  [facility] Cardiologist: Donato Schultz, MD   Subjective   Noangina overnight. Had some abdominal cramps  Inpatient Medications    Scheduled Meds:  aspirin EC  81 mg Oral Daily   azelastine  1 spray Each Nare QHS   And   fluticasone  1 spray Each Nare QHS   carbidopa-levodopa  1 tablet Oral QHS   carbidopa-levodopa  2 tablet Oral 5 times per day   clopidogrel  75 mg Oral Daily   docusate sodium  100 mg Oral BID   enoxaparin (LOVENOX) injection  40 mg Subcutaneous Q24H   escitalopram  20 mg Oral Daily   finasteride  5 mg Oral QPM   loratadine  10 mg Oral QPM   mexiletine  150 mg Oral BID   montelukast  10 mg Oral QHS   pantoprazole (PROTONIX) IV  40 mg Intravenous Q24H   regadenoson  0.4 mg Intravenous Once   rOPINIRole  0.25 mg Oral TID   rosuvastatin  20 mg Oral QHS   saccharomyces boulardii  250 mg Oral BID   sodium chloride flush  3 mL Intravenous Q12H   tamsulosin  0.4 mg Oral QPC supper   zolpidem  5 mg Oral QHS   Continuous Infusions:  lactated ringers 75 mL/hr at 05/27/22 1312   PRN Meds: acetaminophen **OR** acetaminophen, alum & mag hydroxide-simeth, bisacodyl, hydrALAZINE, loperamide, ondansetron **OR** ondansetron (ZOFRAN) IV, mouth rinse, polyethylene glycol, traMADol   Vital Signs    Vitals:   05/27/22 1548 05/27/22 2017 05/28/22 0041 05/28/22 0632  BP: 121/86 (!) 142/92 122/69 138/83  Pulse: 88 79  87  Resp: 20 20 20 20   Temp: 98.1 F (36.7 C) 98.1 F (36.7 C) 98.1 F (36.7 C) 98.1 F (36.7 C)  TempSrc: Oral Oral Oral Oral  SpO2: 99% 97%  97%  Weight:    101.2 kg  Height:        Intake/Output Summary (Last 24 hours) at 05/28/2022 0981 Last data filed at 05/27/2022 2100 Gross per 24 hour  Intake 1929.34 ml  Output --  Net 1929.34 ml      05/28/2022    6:32 AM 05/27/2022    5:35 AM 05/26/2022    4:41 PM  Last 3 Weights  Weight (lbs) 223 lb  3.2 oz 220 lb 3.2 oz 220 lb 14.4 oz  Weight (kg) 101.243 kg 99.882 kg 100.2 kg      Telemetry    Normal sinus rhythm- Personally Reviewed  ECG    No new tracing- Personally Reviewed  Physical Exam  Appears comfortable GEN: No acute distress.   Neck: No JVD Cardiac: RRR, no diastolic murmurs, rubs, or gallops.  Respiratory: Clear to auscultation bilaterally. GI: Soft, nontender, non-distended  MS: No edema; No deformity. Neuro:  Nonfocal  Psych: Normal affect   Labs    High Sensitivity Troponin:   Recent Labs  Lab 05/26/22 0933 05/26/22 1133  TROPONINIHS 6 6     Chemistry Recent Labs  Lab 05/26/22 0933 05/27/22 0102  NA 137 132*  K 4.0 3.3*  CL 104 102  CO2 21* 22  GLUCOSE 127* 106*  BUN 20 22  CREATININE 0.78 0.96  CALCIUM 9.1 8.5*  GFRNONAA >60 >60  ANIONGAP 12 8    Lipids  Recent Labs  Lab 05/26/22 1831  CHOL 85  TRIG 80  HDL 46  LDLCALC 23  CHOLHDL 1.8  Hematology Recent Labs  Lab 05/26/22 0933 05/27/22 0102  WBC 5.1 5.5  RBC 4.79 4.09*  HGB 15.7 13.2  HCT 46.4 39.7  MCV 96.9 97.1  MCH 32.8 32.3  MCHC 33.8 33.2  RDW 13.4 13.5  PLT 197 174   Thyroid  Recent Labs  Lab 05/26/22 1831  TSH 0.893    BNP Recent Labs  Lab 05/26/22 0955  BNP 33.4    DDimer No results for input(s): "DDIMER" in the last 168 hours.   Radiology    ECHOCARDIOGRAM COMPLETE  Result Date: 05/27/2022    ECHOCARDIOGRAM REPORT   Patient Name:   Joshua [name] Date of Exam: 05/27/2022 Medical Rec #:  [id number]            Height:       73.0 in Accession #:    [id number]           Weight:       220.2 lb Date of Birth:  May 28, 1945           BSA:          2.241 m Patient Age:    76 years             BP:           110/68 mmHg Patient Gender: M                    HR:           82 bpm. Exam Location:  Inpatient Procedure: 2D Echo, Color Doppler, Cardiac Doppler and Intracardiac            Opacification Agent Indications:    Chest Pain  History:        Patient  has prior history of Echocardiogram examinations, most                 recent 02/06/2021. Previous Myocardial Infarction and CAD, OSA                 and Stroke, Arrythmias:RBBB; Risk Factors:Dyslipidemia and                 Hypertension.  Sonographer:    Milbert Coulter Referring Phys: [id number] JENNIFER YATES  Sonographer Comments: Suboptimal apical window. Image acquisition challenging due to patient body habitus. IMPRESSIONS  1. Left ventricular ejection fraction, by estimation, is 60 to 65%. The left ventricle has normal function. The left ventricle has no regional wall motion abnormalities. There is mild concentric left ventricular hypertrophy. Left ventricular diastolic parameters are consistent with Grade I diastolic dysfunction (impaired relaxation).  2. Right ventricular systolic function is normal. The right ventricular size is not well visualized.  3. The mitral valve was not well visualized. No evidence of mitral valve regurgitation.  4. The aortic valve was not well visualized. Aortic valve regurgitation is not visualized. No aortic stenosis is present. Comparison(s): No significant change from prior study. This study is technically more challenging from prior. FINDINGS  Left Ventricle: Left ventricular ejection fraction, by estimation, is 60 to 65%. The left ventricle has normal function. The left ventricle has no regional wall motion abnormalities. Definity contrast agent was given IV to delineate the left ventricular  endocardial borders. The left ventricular internal cavity size was normal in size. There is mild concentric left ventricular hypertrophy. Left ventricular diastolic parameters are consistent with Grade I diastolic dysfunction (impaired relaxation). Right Ventricle: The right ventricular size is not well visualized. Right vetricular wall thickness was  not well visualized. Right ventricular systolic function is normal. Left Atrium: Left atrial size was not well visualized. Right Atrium: Right  atrial size was not well visualized. Pericardium: There is no evidence of pericardial effusion. Mitral Valve: The mitral valve was not well visualized. No evidence of mitral valve regurgitation. Tricuspid Valve: The tricuspid valve is not well visualized. Tricuspid valve regurgitation is not demonstrated. Aortic Valve: The aortic valve was not well visualized. Aortic valve regurgitation is not visualized. No aortic stenosis is present. Aortic valve mean gradient measures 4.0 mmHg. Aortic valve peak gradient measures 7.4 mmHg. Aortic valve area, by VTI measures 2.65 cm. Pulmonic Valve: The pulmonic valve was not well visualized. Pulmonic valve regurgitation is not visualized. Aorta: The aortic root is normal in size and structure and the ascending aorta was not well visualized. IAS/Shunts: The interatrial septum was not well visualized.  LEFT VENTRICLE PLAX 2D LVIDd:         4.50 cm   Diastology LVIDs:         3.30 cm   LV e' medial:    5.98 cm/s LV PW:         1.30 cm   LV E/e' medial:  7.1 LV IVS:        1.20 cm   LV e' lateral:   6.64 cm/s LVOT diam:     2.10 cm   LV E/e' lateral: 6.4 LV SV:         62 LV SV Index:   28 LVOT Area:     3.46 cm  RIGHT VENTRICLE RV S prime:     13.40 cm/s TAPSE (M-mode): 2.0 cm LEFT ATRIUM             Index LA diam:        3.00 cm 1.34 cm/m LA Vol (A2C):   49.7 ml 22.17 ml/m LA Vol (A4C):   43.6 ml 19.45 ml/m LA Biplane Vol: 50.3 ml 22.44 ml/m  AORTIC VALVE AV Area (Vmax):    2.65 cm AV Area (Vmean):   2.52 cm AV Area (VTI):     2.65 cm AV Vmax:           136.00 cm/s AV Vmean:          92.400 cm/s AV VTI:            0.233 m AV Peak Grad:      7.4 mmHg AV Mean Grad:      4.0 mmHg LVOT Vmax:         104.00 cm/s LVOT Vmean:        67.100 cm/s LVOT VTI:          0.178 m LVOT/AV VTI ratio: 0.76  AORTA Ao Root diam: 3.00 cm MITRAL VALVE MV Area (PHT): 4.63 cm    SHUNTS MV Decel Time: 164 msec    Systemic VTI:  0.18 m MV E velocity: 42.40 cm/s  Systemic Diam: 2.10 cm MV A  velocity: 83.80 cm/s MV E/A ratio:  0.51 Riley Lam MD Electronically signed by Riley Lam MD Signature Date/Time: 05/27/2022/4:23:01 PM    Final    DG Chest Portable 1 View  Result Date: 05/26/2022 CLINICAL DATA:  Shortness of breath and chest pain. History of coronary artery disease, asthma, and hypertension. EXAM: PORTABLE CHEST 1 VIEW COMPARISON:  Chest radiographs 02/05/2021 and 01/06/2018 FINDINGS: Cardiac silhouette is again at the upper limits of normal size for AP technique. There is again a mildly tortuous thoracic aorta. Mild atherosclerotic  calcifications. Mildly decreased lung volumes. The lungs are clear. No pleural effusion or pneumothorax. ACDF hardware overlies the lower cervical spine. IMPRESSION: Mildly decreased lung volumes. No acute cardiopulmonary process. Electronically Signed   By: Neita Garnet M.D.   On: 05/26/2022 10:06    Cardiac Studies    Cardiac catheterization 02/05/2021     Prox LAD to Mid LAD lesion is 30% stenosed.   1st Diag lesion is 70% stenosed.   Mid LAD-1 lesion is 40% stenosed.   Mid LAD-2 lesion is 60% stenosed.   Dist LAD lesion is 80% stenosed.   Ramus-1 lesion is 60% stenosed.   Ramus-2 lesion is 80% stenosed.   There is moderate left ventricular systolic dysfunction.   LV end diastolic pressure is mildly elevated.   The left ventricular ejection fraction is 35-45% by visual estimate.   1.  No clear culprit is identified for non-ST elevation myocardial infarction with no significant disease affecting the main vessels.  He does have distal LAD stenosis as well as distal ramus stenosis supplying small segments and diagonal disease.  Initially, the LAD was thought to be occluded but in reality, it had separate ostium from the left circumflex and I was able to selectively engage the LAD.   2.  Moderately reduced LV systolic function 5 to 40%.  Possible distal anterior and apical hypokinesis.   Recommendations: Medical therapy  with nitroglycerin drip for pain control.  Resume heparin drip 6 hours. Obtain an echocardiogram to evaluate for possible stress-induced cardiomyopathy.    Dominance: Right  Echocardiogram 04/28/2022   1. Left ventricular ejection fraction, by estimation, is 60 to 65%. The  left ventricle has normal function. The left ventricle has no regional  wall motion abnormalities. There is mild concentric left ventricular  hypertrophy. Left ventricular diastolic  parameters are consistent with Grade I diastolic dysfunction (impaired  relaxation).   2. Right ventricular systolic function is normal. The right ventricular  size is not well visualized.   3. The mitral valve was not well visualized. No evidence of mitral valve  regurgitation.   4. The aortic valve was not well visualized. Aortic valve regurgitation  is not visualized. No aortic stenosis is present.   Comparison(s): No significant change from prior study. This study is  technically more challenging from prior.   Patient Profile     77 y.o. male with a known CAD involving secondary branches and distal LAD presenting with symptoms suggesting unstable angina, but with low risk biomarkers and ECG.  Assessment & Plan    No active CV complaints overnight. Echo shows normal EF/wall motion.    For nuclear perfusion study today.  If the findings are normal or low risk no additional cardiology workup is planned at this time. Have considered empirical vasodilator therapy for possible coronary vasospasm, but the risk of worsening orthostatic hypotension appears to outweigh the possible benefit for  infrequent symptoms of angina.  For questions or updates, please contact [facility] Please consult [email or web address] for contact info under        Signed, Thurmon Fair, MD  05/28/2022, 8:22 AM

## 2022-05-28 NOTE — Telephone Encounter (Signed)
Called patients wife patient has been admitted to the hospital for a cardiac event and they are determining if he is going to require a cardiac catheterization of his heart at this time. While patient has been in the hospital he has had horrible diarrhea and stomach burning and pain patients wife wondering id this could be coming from the Ropinirole he just started ?

## 2022-05-28 NOTE — Consult Note (Signed)
Reason for Consult:abdominal swelling Referring Provider: Lanae Boast  Joshua [name] is an 77 y.o. male.  HPI: 77 yo male with history of cholecystectomy and hernia repair presented initially for back pain. He underwent cardiac work up. During the hospitalization he was having diarrhea 2 days ago and nothing yesterday. He has been nauseated and burping but no vomiting. He has never had a bowel obstruction before.  Past Medical History:  Diagnosis Date   Allergic rhinitis    Anxiety    from chronic pain from surgery- on Cymbalta   Arthritis    Asthma    Barrett's esophagus 03/29/2014   Carotid artery disease (HCC)    Carotid Doppler normal August, 2007   Diverticulosis    Dyslipidemia    GERD (gastroesophageal reflux disease)    History of loop recorder    has since 04/04/15   HTN (hypertension)    takes Metoprolol for PVC control   Hx of colonic polyps    adenomatous   IFG (impaired fasting glucose)    Palpitations    Benign PVCs   Parkinson disease    Prostate cancer (HCC)    RBBB (right bundle branch block)    rate related   Shingles    TIA (transient ischemic attack) 02/2015   Per pt, had 2 strokes   Vertigo     Past Surgical History:  Procedure Laterality Date   BACK SURGERY  2002,2009   x 6   CHOLECYSTECTOMY  11/30/2012   with IOC   COLONOSCOPY     ELECTROPHYSIOLOGIC STUDY N/A 09/26/2015   Procedure: V Tach Ablation (PVC);  Surgeon: Will Jorja Loa, MD;  Location: [facility];  Service: Cardiovascular;  Laterality: N/A;   EP IMPLANTABLE DEVICE N/A 04/04/2015   Procedure: Loop Recorder Insertion;  Surgeon: Will Jorja Loa, MD;  Location: [facility];  Service: Cardiovascular;  Laterality: N/A;   HERNIA REPAIR     laprascopic   KNEE SURGERY     DECEMBER 2017,LEFT KNEE SCOPED   LEFT HEART CATH AND CORONARY ANGIOGRAPHY N/A 02/05/2021   Procedure: LEFT HEART CATH AND CORONARY ANGIOGRAPHY;  Surgeon: Iran Ouch, MD;  Location: [facility]  INVAS[facility];  Service: Cardiovascular;  Laterality: N/A;   NECK SURGERY  2002   POLYPECTOMY     ROTATOR CUFF REPAIR Left    TEE WITHOUT CARDIOVERSION N/A 04/04/2015   Procedure: TRANSESOPHAGEAL ECHOCARDIOGRAM (TEE);  Surgeon: Lewayne Bunting, MD;  Location: [facility] ENDOSCOPY;  Service: Cardiovascular;  Laterality: N/A;   TRIGGER FINGER RELEASE Left 12/28/2019   Procedure: RELEASE TRIGGER FINGER/A-1 PULLEY THUMB, MIDDLE AND RING;  Surgeon: Cindee Salt, MD;  Location: [facility];  Service: Orthopedics;  Laterality: Left;  FAB   UPPER GASTROINTESTINAL ENDOSCOPY     V Tach ablation  09/26/2015    Family History  Problem Relation Age of Onset   Hypertension Mother    Heart attack Father    Heart disease Father    Clotting disorder Father    Heart disease Brother    Lung cancer Brother    Prostate cancer Brother    Breast cancer Paternal Grandmother    Heart attack Paternal Grandfather    Healthy Daughter    Healthy Daughter    Esophageal cancer Neg Hx    Stomach cancer Neg Hx    Rectal cancer Neg Hx    Colon polyps Neg Hx     Social History:  reports that he has never smoked.  He has never used smokeless tobacco. He reports that he does not drink alcohol and does not use drugs.  Allergies:  Allergies  Allergen Reactions   Floxin I.V. In Dextrose 5% [Ofloxacin] Other (See Comments)    Lowers BP Lowers BP   Terazosin Other (See Comments)    Lower bp    Medications: I have reviewed the patient's current medications.  Results for orders placed or performed during the hospital encounter of 05/26/22 (from the past 48 hour(s))  Basic metabolic panel     Status: Abnormal   Collection Time: 05/27/22  1:02 AM  Result Value Ref Range   Sodium 132 (L) 135 - 145 mmol/L   Potassium 3.3 (L) 3.5 - 5.1 mmol/L   Chloride 102 98 - 111 mmol/L   CO2 22 22 - 32 mmol/L   Glucose, Bld 106 (H) 70 - 99 mg/dL    Comment: Glucose reference range applies only to samples taken after  fasting for at least 8 hours.   BUN 22 8 - 23 mg/dL   Creatinine, Ser 1.61 0.61 - 1.24 mg/dL   Calcium 8.5 (L) 8.9 - 10.3 mg/dL   GFR, Estimated >09 >60 mL/min    Comment: (NOTE) Calculated using the CKD-EPI Creatinine Equation (2021)    Anion gap 8 5 - 15    Comment: Performed at [facility], [address] [place], [place] [address]  CBC     Status: Abnormal   Collection Time: 05/27/22  1:02 AM  Result Value Ref Range   WBC 5.5 4.0 - 10.5 K/uL   RBC 4.09 (L) 4.22 - 5.81 MIL/uL   Hemoglobin 13.2 13.0 - 17.0 g/dL   HCT 81.1 91.4 - 78.2 %   MCV 97.1 80.0 - 100.0 fL   MCH 32.3 26.0 - 34.0 pg   MCHC 33.2 30.0 - 36.0 g/dL   RDW 95.6 21.3 - 08.6 %   Platelets 174 150 - 400 K/uL   nRBC 0.0 0.0 - 0.2 %    Comment: Performed at [facility], [address] [place], [place] [address]    CT ABDOMEN PELVIS W CONTRAST  Result Date: 05/28/2022 CLINICAL DATA:  Acute abdominal pain EXAM: CT ABDOMEN AND PELVIS WITH CONTRAST TECHNIQUE: Multidetector CT imaging of the abdomen and pelvis was performed using the standard protocol following bolus administration of intravenous contrast. RADIATION DOSE REDUCTION: This exam was performed according to the departmental dose-optimization program which includes automated exposure control, adjustment of the mA and/or kV according to patient size and/or use of iterative reconstruction technique. CONTRAST:  75mL OMNIPAQUE IOHEXOL 350 MG/ML SOLN COMPARISON:  CT chest abdomen and pelvis 02/05/2021 FINDINGS: Lower chest: There is atelectasis in the lung bases. Hepatobiliary: No focal liver abnormality is seen. Status post cholecystectomy. No biliary dilatation. Pancreas: Unremarkable. No pancreatic ductal dilatation or surrounding inflammatory changes. Spleen: Normal in size without focal abnormality. Adrenals/Urinary Tract: Adrenal glands are unremarkable. Kidneys are normal, without renal calculi, focal lesion, or hydronephrosis. Bladder is unremarkable.  Stomach/Bowel: There are dilated mid and proximal small bowel loops with air-fluid levels measuring up to 4.6 Cm. There is some associated mesenteric edema. Stomach is distended with fluid and there is also fluid in the distal esophagus which can be seen with gastroesophageal reflux. A transition point is seen in the central abdomen image 3/68. Distal small bowel and colon are nondilated. There is colonic diverticulosis without evidence for acute diverticulitis. The appendix is within normal limits. There is no pneumatosis or  free air. Vascular/Lymphatic: Aortic atherosclerosis. No enlarged abdominal or pelvic lymph nodes. Reproductive: Prostate is unremarkable. There surgical clips near the prostate gland. Other: There is no ascites or free air. There has been prior right inguinal hernia repair. There are very small fat containing bilateral inguinal and umbilical hernias. Musculoskeletal: L3-S1 posterior fusion hardware is present. IMPRESSION: 1. Findings compatible with small bowel obstruction with transition point in the central abdomen. There is associated mesenteric edema. No pneumatosis or free air. 2. Colonic diverticulosis. Aortic Atherosclerosis (ICD10-I70.0). Electronically Signed   By: Darliss Cheney M.D.   On: 05/28/2022 18:54   NM Myocar Multi W/Spect Izetta Dakin Motion / EF  Result Date: 05/28/2022 CLINICAL DATA:  Chest pain.  Intermediate CAD risk. EXAM: MYOCARDIAL IMAGING WITH SPECT (REST AND PHARMACOLOGIC-STRESS) GATED LEFT VENTRICULAR WALL MOTION STUDY LEFT VENTRICULAR EJECTION FRACTION TECHNIQUE: Standard myocardial SPECT imaging was performed after resting intravenous injection of 9.9 mCi Tc-54m tetrofosmin. Subsequently, intravenous infusion of Lexiscan was performed under the supervision of the Cardiology staff. At peak effect of the drug, 31.4 mCi Tc-55m tetrofosmin was injected intravenously and standard myocardial SPECT imaging was performed. Quantitative gated imaging was also performed to  evaluate left ventricular wall motion, and estimate left ventricular ejection fraction. COMPARISON:  None Available. FINDINGS: Perfusion: A small fixed defect is seen in the distal inferolateral wall of the left ventricle on both stress and resting images, likely due to diaphragmatic attenuation artifact. No reversible myocardial perfusion defects are seen to suggest reversible myocardial ischemia. Wall Motion: Normal left ventricular wall motion. No left ventricular dilation. Left Ventricular Ejection Fraction: 48 % End diastolic volume 92 ml End systolic volume 47 ml IMPRESSION: 1. No reversible ischemia. Probable diaphragmatic attenuation artifact. 2. Normal left ventricular wall motion. 3. Left ventricular ejection fraction 48% 4. Non invasive risk stratification*: Low *2012 Appropriate Use Criteria for Coronary Revascularization Focused Update: J Am Coll Cardiol. 2012;59(9):857-881. http://content.dementiazones.com.aspx?articleid=[email or web address] Electronically Signed   By: Danae Orleans M.D.   On: 05/28/2022 15:11   DG Abd Portable 1V  Result Date: 05/28/2022 CLINICAL DATA:  [id number] Pain in the abdomen [id number] EXAM: PORTABLE ABDOMEN - 1 VIEW COMPARISON:  CT 02/05/2021 FINDINGS: Stomach is nondistended. Multiple gas dilated small bowel loops in the mid abdomen. Colon is nondilated. Cholecystectomy clips. Lumbosacral fixation hardware with adjacent level degenerative disc disease L2-3. IMPRESSION: Dilated small bowel loops suggesting small bowel obstruction. Electronically Signed   By: Corlis Leak M.D.   On: 05/28/2022 14:42   ECHOCARDIOGRAM COMPLETE  Result Date: 05/27/2022    ECHOCARDIOGRAM REPORT   Patient Name:   Joshua [name] Date of Exam: 05/27/2022 Medical Rec #:  [id number]            Height:       73.0 in Accession #:    [id number]           Weight:       220.2 lb Date of Birth:  1946/01/07           BSA:          2.241 m Patient Age:    76 years             BP:           110/68 mmHg Patient  Gender: M                    HR:           82 bpm. Exam Location:  Inpatient Procedure: 2D Echo, Color  Doppler, Cardiac Doppler and Intracardiac            Opacification Agent Indications:    Chest Pain  History:        Patient has prior history of Echocardiogram examinations, most                 recent 02/06/2021. Previous Myocardial Infarction and CAD, OSA                 and Stroke, Arrythmias:RBBB; Risk Factors:Dyslipidemia and                 Hypertension.  Sonographer:    Milbert Coulter Referring Phys: [id number] JENNIFER YATES  Sonographer Comments: Suboptimal apical window. Image acquisition challenging due to patient body habitus. IMPRESSIONS  1. Left ventricular ejection fraction, by estimation, is 60 to 65%. The left ventricle has normal function. The left ventricle has no regional wall motion abnormalities. There is mild concentric left ventricular hypertrophy. Left ventricular diastolic parameters are consistent with Grade I diastolic dysfunction (impaired relaxation).  2. Right ventricular systolic function is normal. The right ventricular size is not well visualized.  3. The mitral valve was not well visualized. No evidence of mitral valve regurgitation.  4. The aortic valve was not well visualized. Aortic valve regurgitation is not visualized. No aortic stenosis is present. Comparison(s): No significant change from prior study. This study is technically more challenging from prior. FINDINGS  Left Ventricle: Left ventricular ejection fraction, by estimation, is 60 to 65%. The left ventricle has normal function. The left ventricle has no regional wall motion abnormalities. Definity contrast agent was given IV to delineate the left ventricular  endocardial borders. The left ventricular internal cavity size was normal in size. There is mild concentric left ventricular hypertrophy. Left ventricular diastolic parameters are consistent with Grade I diastolic dysfunction (impaired relaxation). Right Ventricle: The  right ventricular size is not well visualized. Right vetricular wall thickness was not well visualized. Right ventricular systolic function is normal. Left Atrium: Left atrial size was not well visualized. Right Atrium: Right atrial size was not well visualized. Pericardium: There is no evidence of pericardial effusion. Mitral Valve: The mitral valve was not well visualized. No evidence of mitral valve regurgitation. Tricuspid Valve: The tricuspid valve is not well visualized. Tricuspid valve regurgitation is not demonstrated. Aortic Valve: The aortic valve was not well visualized. Aortic valve regurgitation is not visualized. No aortic stenosis is present. Aortic valve mean gradient measures 4.0 mmHg. Aortic valve peak gradient measures 7.4 mmHg. Aortic valve area, by VTI measures 2.65 cm. Pulmonic Valve: The pulmonic valve was not well visualized. Pulmonic valve regurgitation is not visualized. Aorta: The aortic root is normal in size and structure and the ascending aorta was not well visualized. IAS/Shunts: The interatrial septum was not well visualized.  LEFT VENTRICLE PLAX 2D LVIDd:         4.50 cm   Diastology LVIDs:         3.30 cm   LV e' medial:    5.98 cm/s LV PW:         1.30 cm   LV E/e' medial:  7.1 LV IVS:        1.20 cm   LV e' lateral:   6.64 cm/s LVOT diam:     2.10 cm   LV E/e' lateral: 6.4 LV SV:         62 LV SV Index:   28 LVOT Area:     3.46 cm  RIGHT VENTRICLE RV S prime:     13.40 cm/s TAPSE (M-mode): 2.0 cm LEFT ATRIUM             Index LA diam:        3.00 cm 1.34 cm/m LA Vol (A2C):   49.7 ml 22.17 ml/m LA Vol (A4C):   43.6 ml 19.45 ml/m LA Biplane Vol: 50.3 ml 22.44 ml/m  AORTIC VALVE AV Area (Vmax):    2.65 cm AV Area (Vmean):   2.52 cm AV Area (VTI):     2.65 cm AV Vmax:           136.00 cm/s AV Vmean:          92.400 cm/s AV VTI:            0.233 m AV Peak Grad:      7.4 mmHg AV Mean Grad:      4.0 mmHg LVOT Vmax:         104.00 cm/s LVOT Vmean:        67.100 cm/s LVOT VTI:           0.178 m LVOT/AV VTI ratio: 0.76  AORTA Ao Root diam: 3.00 cm MITRAL VALVE MV Area (PHT): 4.63 cm    SHUNTS MV Decel Time: 164 msec    Systemic VTI:  0.18 m MV E velocity: 42.40 cm/s  Systemic Diam: 2.10 cm MV A velocity: 83.80 cm/s MV E/A ratio:  0.51 Riley Lam MD Electronically signed by Riley Lam MD Signature Date/Time: 05/27/2022/4:23:01 PM    Final     ROS  PE Blood pressure (!) 155/94, pulse 75, temperature 98.7 F (37.1 C), temperature source Oral, resp. rate 18, height 6\' 1"  (1.854 m), weight 101.2 kg, SpO2 96 %. Constitutional: NAD; conversant; no deformities Eyes: Moist conjunctiva; no lid lag; anicteric; PERRL Neck: Trachea midline; no thyromegaly Lungs: Normal respiratory effort; no tactile fremitus CV: RRR; no palpable thrills; no pitting edema GI: Abd soft, distended, multiple small scars; no palpable hepatosplenomegaly MSK: Normal gait; no clubbing/cyanosis Psychiatric: Appropriate affect; alert and oriented x3 Lymphatic: No palpable cervical or axillary lymphadenopathy Skin: No major subcutaneous nodules. Warm and dry   Assessment/Plan: 77 yo male with small bowel obstruction. Nontender. Ct scan images reviewed showing lower abdominal transition, Radiologist concerned for edema in mesentery but there appears to be more free fluid. -place NG tube to suction -bowel rest -SBO protocol  I reviewed last 24 h vitals and pain scores, last 48 h intake and output, last 24 h labs and trends, and last 24 h imaging results.  This care required high  level of medical decision making.   De Blanch Shelah Heatley 05/28/2022, 9:39 PM

## 2022-05-28 NOTE — Progress Notes (Addendum)
     Joshua [name] presented for a Lexiscan nuclear stress test today.  No immediate complications. Received Aminophylline for residual dyspnea with improvement. Stress imaging is pending at this time.  Preliminary EKG findings may be listed in the chart, but the stress test result will not be finalized until perfusion imaging is complete.  Laurann Montana, PA-C  05/28/2022, 10:16 AM

## 2022-05-28 NOTE — Telephone Encounter (Signed)
Wife called to let Dr. Anne Fu know patient is in hospital.

## 2022-05-28 NOTE — Progress Notes (Signed)
PROGRESS NOTE Joshua [name]  WUJ:[id number] DOB: 04/24/1945 DOA: 05/26/2022 PCP: [name], MD  Brief Narrative/Hospital Course: 77 y.o.m w/ Parkinson's, prostate CA, HLD, HTN, preDM, and TIA, NSTEMI in January 2023  w/ Cath showing small vessel diagonal/OM and distal LAD dx treated medically-TTE 02/06/21 showed normalization of EF, history of PVC on mexiletine followed by Dr. Elberta Fortis presentied with chest pain/epigastric pain and shortness of breath since 3 AM after doing yard work earlier in the day. He was recently placed on new medication for Parkinson's about a week ago (ropinirole).  He sees [facility] and had a recent colonoscopy and endoscopy (4/10) EGD with HH, fundic gland polyps, and mild esophageal dilation, pathology was unremarkable. In the ED -vitals with mild tachycardia initial BP 228/212> subsequent blood pressure normal in 90s to 100.labs stable CBC BMP troponin negative x 2 BNP 33. Seen by cardiology admitted for further management Further labs with LDL 23, HDL 46 HbA1c 5.3 TSH 0.8, mild hypokalemia and hyponatremia He was admitted for further workup.  Underwent echocardiogram that showed normal EF/wall motion and cardiology did nuclear perfusion study 5/16 to evaluate further. Cardiology considering empiric vasodilator therapy for possible coronary vasospasm but the risk of worsening orthostatic hypotension appears to outweigh the benefits.    Subjective: Seen and examined this morning no chest pain underwent stress test.  Complains of abdominal pain the lower abdomen moderate to severe no diarrhea today but has had diarrhea since he came to the ED    Assessment and Plan:  Chest pain: Symptoms concerning for acute coronary syndrome with history of ST elevation MI in 2023. Echo normal and underwent a nuclear stress test > awaiting stress test result.   Diarrhea Epigastric pain with history of GERD recent unremarkable EGD, lipase normal, continue PPI,  Maalox.check C. difficile.  With moderate to severe acute abdominal pain will order CT abdomen pelvis with contrast, if persistent GI eval.  STEMI in January 2023  w/ Cath showing small vessel diagonal/OM and distal LAD dx treated medically-TTE 02/06/21 showed normalization of EF,   history of PVC on mexiletine followed by Dr. Elberta Fortis   Parkinson's disease: Continue Sinemet  escitalopram.ropinirole was recently started concerned about diarrhea- will hold off.  BPH/Prostate CA history: Continue finasteride tamsulosin follow-up with urology  HLD: Continue Crestor LDL stable at 23.   HTN: BP stable not on meds  preDM: HbA1c 5.3.  DVT prophylaxis: enoxaparin (LOVENOX) injection 40 mg Start: 05/26/22 2200 Code Status:   Code Status: Full Code Family Communication: plan of care discussed with patient at bedside. Patient status is:  admitted as observation but remains hospitalized for ongoing  because of chest pain evaluation and further workup Level of care: Telemetry Cardiac   Dispo: The patient is from: HOME W/ WIFE             Anticipated disposition: TBD Objective: Vitals last 24 hrs: Vitals:   05/28/22 1006 05/28/22 1008 05/28/22 1012 05/28/22 1013  BP: 125/70 126/72 131/76   Pulse: 98 94 89 90  Resp:      Temp:      TempSrc:      SpO2:      Weight:      Height:       Weight change: -1.633 kg  Physical Examination: General exam:AAox3, weak,older appearing HEENT:Oral mucosa moist, Ear/Nose WNL grossly, dentition normal. Respiratory system:Bilaterally clear BS, no use of accessory muscle Cardiovascular system:S1 & S2 +, regular rate, JVD neg. Gastrointestinal system:Abdomen soft, mid-Stinson, tenderness  present mid abdomen, BS+ Nervous System:Alert, awake, moving extremities and grossly nonfocal Extremities:LE ankle edema neg, lower extremities warm Skin:No rashes,no icterus. WUJ:WJXBJY muscle bulk,tone, power   Medications reviewed:  Scheduled Meds:  aspirin EC  81 mg  Oral Daily   azelastine  1 spray Each Nare QHS   And   fluticasone  1 spray Each Nare QHS   carbidopa-levodopa  1 tablet Oral QHS   carbidopa-levodopa  2 tablet Oral 5 times per day   clopidogrel  75 mg Oral Daily   docusate sodium  100 mg Oral BID   enoxaparin (LOVENOX) injection  40 mg Subcutaneous Q24H   escitalopram  20 mg Oral Daily   finasteride  5 mg Oral QPM   loratadine  10 mg Oral QPM   mexiletine  150 mg Oral BID   montelukast  10 mg Oral QHS   pantoprazole  40 mg Oral Daily   regadenoson  0.4 mg Intravenous Once   rOPINIRole  0.25 mg Oral TID   rosuvastatin  20 mg Oral QHS   saccharomyces boulardii  250 mg Oral BID   sodium chloride flush  3 mL Intravenous Q12H   tamsulosin  0.4 mg Oral QPC supper   zolpidem  5 mg Oral QHS  Continuous Infusions:  lactated ringers 75 mL/hr at 05/27/22 1312    Diet Order             Diet Heart Room service appropriate? Yes; Fluid consistency: Thin  Diet effective now                  Intake/Output Summary (Last 24 hours) at 05/28/2022 1416 Last data filed at 05/27/2022 2100 Gross per 24 hour  Intake 1689.34 ml  Output --  Net 1689.34 ml    Net IO Since Admission: 2,576.18 mL [05/28/22 1416]  Wt Readings from Last 3 Encounters:  05/28/22 101.2 kg  05/21/22 102.9 kg  04/08/22 101.3 kg     Unresulted Labs (From admission, onward)     Start     Ordered   05/28/22 1346  C Difficile Quick Screen w PCR reflex  (C Difficile quick screen w PCR reflex panel )  Once, for 24 hours,   TIMED       References:    CDiff Information Tool   05/28/22 1345          Data Reviewed: I have personally reviewed following labs and imaging studies CBC: Recent Labs  Lab 05/26/22 0933 05/27/22 0102  WBC 5.1 5.5  HGB 15.7 13.2  HCT 46.4 39.7  MCV 96.9 97.1  PLT 197 174    Basic Metabolic Panel: Recent Labs  Lab 05/26/22 0933 05/27/22 0102  NA 137 132*  K 4.0 3.3*  CL 104 102  CO2 21* 22  GLUCOSE 127* 106*  BUN 20 22   CREATININE 0.78 0.96  CALCIUM 9.1 8.5*   No results for input(s): "AST", "ALT", "ALKPHOS", "BILITOT", "PROT", "ALBUMIN" in the last 168 hours. Recent Labs  Lab 05/26/22 1831  LIPASE 23    Recent Labs    05/26/22 1831  HGBA1C 5.3   CBG: No results for input(s): "GLUCAP" in the last 168 hours. Lipid Profile: Recent Labs    05/26/22 1831  CHOL 85  HDL 46  LDLCALC 23  TRIG 80  CHOLHDL 1.8    Thyroid Function Tests: Recent Labs    05/26/22 1831  TSH 0.893    Sepsis Labs: No results for input(s): "PROCALCITON", "LATICACIDVEN" in  the last 168 hours.  Recent Results (from the past 240 hour(s))  Resp panel by RT-PCR (RSV, Flu A&B, Covid) Anterior Nasal Swab     Status: None   Collection Time: 05/26/22  3:35 PM   Specimen: Anterior Nasal Swab  Result Value Ref Range Status   SARS Coronavirus 2 by RT PCR NEGATIVE NEGATIVE Final    Comment: (NOTE) SARS-CoV-2 target nucleic acids are NOT DETECTED.  The SARS-CoV-2 RNA is generally detectable in upper respiratory specimens during the acute phase of infection. The lowest concentration of SARS-CoV-2 viral copies this assay can detect is 138 copies/mL. A negative result does not preclude SARS-Cov-2 infection and should not be used as the sole basis for treatment or other patient management decisions. A negative result may occur with  improper specimen collection/handling, submission of specimen other than nasopharyngeal swab, presence of viral mutation(s) within the areas targeted by this assay, and inadequate number of viral copies(<138 copies/mL). A negative result must be combined with clinical observations, patient history, and epidemiological information. The expected result is Negative.  Fact Sheet for Patients:  BloggerCourse.com  Fact Sheet for Healthcare Providers:  SeriousBroker.it  This test is no t yet approved or cleared by the [place] FDA and  has  been authorized for detection and/or diagnosis of SARS-CoV-2 by FDA under an Emergency Use Authorization (EUA). This EUA will remain  in effect (meaning this test can be used) for the duration of the COVID-19 declaration under Section 564(b)(1) of the Act, 21 U.S.C.section 360bbb-3(b)(1), unless the authorization is terminated  or revoked sooner.       Influenza A by PCR NEGATIVE NEGATIVE Final   Influenza B by PCR NEGATIVE NEGATIVE Final    Comment: (NOTE) The Xpert Xpress SARS-CoV-2/FLU/RSV plus assay is intended as an aid in the diagnosis of influenza from Nasopharyngeal swab specimens and should not be used as a sole basis for treatment. Nasal washings and aspirates are unacceptable for Xpert Xpress SARS-CoV-2/FLU/RSV testing.  Fact Sheet for Patients: BloggerCourse.com  Fact Sheet for Healthcare Providers: SeriousBroker.it  This test is not yet approved or cleared by the [place] FDA and has been authorized for detection and/or diagnosis of SARS-CoV-2 by FDA under an Emergency Use Authorization (EUA). This EUA will remain in effect (meaning this test can be used) for the duration of the COVID-19 declaration under Section 564(b)(1) of the Act, 21 U.S.C. section 360bbb-3(b)(1), unless the authorization is terminated or revoked.     Resp Syncytial Virus by PCR NEGATIVE NEGATIVE Final    Comment: (NOTE) Fact Sheet for Patients: BloggerCourse.com  Fact Sheet for Healthcare Providers: SeriousBroker.it  This test is not yet approved or cleared by the [place] FDA and has been authorized for detection and/or diagnosis of SARS-CoV-2 by FDA under an Emergency Use Authorization (EUA). This EUA will remain in effect (meaning this test can be used) for the duration of the COVID-19 declaration under Section 564(b)(1) of the Act, 21 U.S.C. section 360bbb-3(b)(1), unless the  authorization is terminated or revoked.  Performed at [facility], [address], [place], [place] [address]     Antimicrobials: Anti-infectives (From admission, onward)    None      Culture/Microbiology No results found for: "SDES", "SPECREQUEST", "CULT", "REPTSTATUS"  Radiology Studies: ECHOCARDIOGRAM COMPLETE  Result Date: 05/27/2022    ECHOCARDIOGRAM REPORT   Patient Name:   Joshua [name] Date of Exam: 05/27/2022 Medical Rec #:  [id number]  Height:       73.0 in Accession #:    [id number]           Weight:       220.2 lb Date of Birth:  Apr 19, 1945           BSA:          2.241 m Patient Age:    76 years             BP:           110/68 mmHg Patient Gender: M                    HR:           82 bpm. Exam Location:  Inpatient Procedure: 2D Echo, Color Doppler, Cardiac Doppler and Intracardiac            Opacification Agent Indications:    Chest Pain  History:        Patient has prior history of Echocardiogram examinations, most                 recent 02/06/2021. Previous Myocardial Infarction and CAD, OSA                 and Stroke, Arrythmias:RBBB; Risk Factors:Dyslipidemia and                 Hypertension.  Sonographer:    Milbert Coulter Referring Phys: [id number] JENNIFER YATES  Sonographer Comments: Suboptimal apical window. Image acquisition challenging due to patient body habitus. IMPRESSIONS  1. Left ventricular ejection fraction, by estimation, is 60 to 65%. The left ventricle has normal function. The left ventricle has no regional wall motion abnormalities. There is mild concentric left ventricular hypertrophy. Left ventricular diastolic parameters are consistent with Grade I diastolic dysfunction (impaired relaxation).  2. Right ventricular systolic function is normal. The right ventricular size is not well visualized.  3. The mitral valve was not well visualized. No evidence of mitral valve regurgitation.  4. The aortic valve was not well visualized.  Aortic valve regurgitation is not visualized. No aortic stenosis is present. Comparison(s): No significant change from prior study. This study is technically more challenging from prior. FINDINGS  Left Ventricle: Left ventricular ejection fraction, by estimation, is 60 to 65%. The left ventricle has normal function. The left ventricle has no regional wall motion abnormalities. Definity contrast agent was given IV to delineate the left ventricular  endocardial borders. The left ventricular internal cavity size was normal in size. There is mild concentric left ventricular hypertrophy. Left ventricular diastolic parameters are consistent with Grade I diastolic dysfunction (impaired relaxation). Right Ventricle: The right ventricular size is not well visualized. Right vetricular wall thickness was not well visualized. Right ventricular systolic function is normal. Left Atrium: Left atrial size was not well visualized. Right Atrium: Right atrial size was not well visualized. Pericardium: There is no evidence of pericardial effusion. Mitral Valve: The mitral valve was not well visualized. No evidence of mitral valve regurgitation. Tricuspid Valve: The tricuspid valve is not well visualized. Tricuspid valve regurgitation is not demonstrated. Aortic Valve: The aortic valve was not well visualized. Aortic valve regurgitation is not visualized. No aortic stenosis is present. Aortic valve mean gradient measures 4.0 mmHg. Aortic valve peak gradient measures 7.4 mmHg. Aortic valve area, by VTI measures 2.65 cm. Pulmonic Valve: The pulmonic valve was not well visualized. Pulmonic valve regurgitation is not visualized. Aorta: The aortic root is normal in size and  structure and the ascending aorta was not well visualized. IAS/Shunts: The interatrial septum was not well visualized.  LEFT VENTRICLE PLAX 2D LVIDd:         4.50 cm   Diastology LVIDs:         3.30 cm   LV e' medial:    5.98 cm/s LV PW:         1.30 cm   LV E/e' medial:   7.1 LV IVS:        1.20 cm   LV e' lateral:   6.64 cm/s LVOT diam:     2.10 cm   LV E/e' lateral: 6.4 LV SV:         62 LV SV Index:   28 LVOT Area:     3.46 cm  RIGHT VENTRICLE RV S prime:     13.40 cm/s TAPSE (M-mode): 2.0 cm LEFT ATRIUM             Index LA diam:        3.00 cm 1.34 cm/m LA Vol (A2C):   49.7 ml 22.17 ml/m LA Vol (A4C):   43.6 ml 19.45 ml/m LA Biplane Vol: 50.3 ml 22.44 ml/m  AORTIC VALVE AV Area (Vmax):    2.65 cm AV Area (Vmean):   2.52 cm AV Area (VTI):     2.65 cm AV Vmax:           136.00 cm/s AV Vmean:          92.400 cm/s AV VTI:            0.233 m AV Peak Grad:      7.4 mmHg AV Mean Grad:      4.0 mmHg LVOT Vmax:         104.00 cm/s LVOT Vmean:        67.100 cm/s LVOT VTI:          0.178 m LVOT/AV VTI ratio: 0.76  AORTA Ao Root diam: 3.00 cm MITRAL VALVE MV Area (PHT): 4.63 cm    SHUNTS MV Decel Time: 164 msec    Systemic VTI:  0.18 m MV E velocity: 42.40 cm/s  Systemic Diam: 2.10 cm MV A velocity: 83.80 cm/s MV E/A ratio:  0.51 Riley Lam MD Electronically signed by Riley Lam MD Signature Date/Time: 05/27/2022/4:23:01 PM    Final      LOS: 0 days   Lanae Boast, MD [facility]  05/28/2022, 2:16 PM

## 2022-05-29 ENCOUNTER — Observation Stay (HOSPITAL_COMMUNITY): Payer: Medicare HMO

## 2022-05-29 ENCOUNTER — Inpatient Hospital Stay (HOSPITAL_COMMUNITY): Payer: Medicare HMO

## 2022-05-29 DIAGNOSIS — Z801 Family history of malignant neoplasm of trachea, bronchus and lung: Secondary | ICD-10-CM | POA: Diagnosis not present

## 2022-05-29 DIAGNOSIS — K56609 Unspecified intestinal obstruction, unspecified as to partial versus complete obstruction: Secondary | ICD-10-CM | POA: Diagnosis present

## 2022-05-29 DIAGNOSIS — E876 Hypokalemia: Secondary | ICD-10-CM | POA: Diagnosis present

## 2022-05-29 DIAGNOSIS — Z7902 Long term (current) use of antithrombotics/antiplatelets: Secondary | ICD-10-CM | POA: Diagnosis not present

## 2022-05-29 DIAGNOSIS — I25118 Atherosclerotic heart disease of native coronary artery with other forms of angina pectoris: Secondary | ICD-10-CM

## 2022-05-29 DIAGNOSIS — I252 Old myocardial infarction: Secondary | ICD-10-CM | POA: Diagnosis not present

## 2022-05-29 DIAGNOSIS — R079 Chest pain, unspecified: Secondary | ICD-10-CM | POA: Diagnosis present

## 2022-05-29 DIAGNOSIS — G903 Multi-system degeneration of the autonomic nervous system: Secondary | ICD-10-CM | POA: Diagnosis not present

## 2022-05-29 DIAGNOSIS — T465X6A Underdosing of other antihypertensive drugs, initial encounter: Secondary | ICD-10-CM | POA: Diagnosis present

## 2022-05-29 DIAGNOSIS — I4510 Unspecified right bundle-branch block: Secondary | ICD-10-CM | POA: Diagnosis present

## 2022-05-29 DIAGNOSIS — Z8249 Family history of ischemic heart disease and other diseases of the circulatory system: Secondary | ICD-10-CM | POA: Diagnosis not present

## 2022-05-29 DIAGNOSIS — Z8042 Family history of malignant neoplasm of prostate: Secondary | ICD-10-CM | POA: Diagnosis not present

## 2022-05-29 DIAGNOSIS — K219 Gastro-esophageal reflux disease without esophagitis: Secondary | ICD-10-CM | POA: Diagnosis present

## 2022-05-29 DIAGNOSIS — I10 Essential (primary) hypertension: Secondary | ICD-10-CM | POA: Diagnosis present

## 2022-05-29 DIAGNOSIS — J45909 Unspecified asthma, uncomplicated: Secondary | ICD-10-CM | POA: Diagnosis present

## 2022-05-29 DIAGNOSIS — R7303 Prediabetes: Secondary | ICD-10-CM | POA: Diagnosis present

## 2022-05-29 DIAGNOSIS — Z832 Family history of diseases of the blood and blood-forming organs and certain disorders involving the immune mechanism: Secondary | ICD-10-CM | POA: Diagnosis not present

## 2022-05-29 DIAGNOSIS — Z8673 Personal history of transient ischemic attack (TIA), and cerebral infarction without residual deficits: Secondary | ICD-10-CM | POA: Diagnosis not present

## 2022-05-29 DIAGNOSIS — Z1152 Encounter for screening for COVID-19: Secondary | ICD-10-CM | POA: Diagnosis not present

## 2022-05-29 DIAGNOSIS — E785 Hyperlipidemia, unspecified: Secondary | ICD-10-CM | POA: Diagnosis present

## 2022-05-29 DIAGNOSIS — Z7982 Long term (current) use of aspirin: Secondary | ICD-10-CM | POA: Diagnosis not present

## 2022-05-29 DIAGNOSIS — G20A1 Parkinson's disease without dyskinesia, without mention of fluctuations: Secondary | ICD-10-CM | POA: Diagnosis present

## 2022-05-29 DIAGNOSIS — I2510 Atherosclerotic heart disease of native coronary artery without angina pectoris: Secondary | ICD-10-CM | POA: Diagnosis present

## 2022-05-29 DIAGNOSIS — Z803 Family history of malignant neoplasm of breast: Secondary | ICD-10-CM | POA: Diagnosis not present

## 2022-05-29 DIAGNOSIS — Z79899 Other long term (current) drug therapy: Secondary | ICD-10-CM | POA: Diagnosis not present

## 2022-05-29 DIAGNOSIS — Z9049 Acquired absence of other specified parts of digestive tract: Secondary | ICD-10-CM | POA: Diagnosis not present

## 2022-05-29 DIAGNOSIS — N400 Enlarged prostate without lower urinary tract symptoms: Secondary | ICD-10-CM | POA: Diagnosis present

## 2022-05-29 LAB — COMPREHENSIVE METABOLIC PANEL
ALT: 7 U/L (ref 0–44)
AST: 13 U/L — ABNORMAL LOW (ref 15–41)
Albumin: 3.1 g/dL — ABNORMAL LOW (ref 3.5–5.0)
Alkaline Phosphatase: 44 U/L (ref 38–126)
Anion gap: 8 (ref 5–15)
BUN: 10 mg/dL (ref 8–23)
CO2: 25 mmol/L (ref 22–32)
Calcium: 8.6 mg/dL — ABNORMAL LOW (ref 8.9–10.3)
Chloride: 98 mmol/L (ref 98–111)
Creatinine, Ser: 0.74 mg/dL (ref 0.61–1.24)
GFR, Estimated: >60 mL/min (ref >60)
Glucose, Bld: 106 mg/dL — ABNORMAL HIGH (ref 70–99)
Potassium: 4.2 mmol/L (ref 3.5–5.1)
Sodium: 131 mmol/L — ABNORMAL LOW (ref 135–145)
Total Bilirubin: 1.3 mg/dL — ABNORMAL HIGH (ref 0.3–1.2)
Total Protein: 5.8 g/dL — ABNORMAL LOW (ref 6.5–8.1)

## 2022-05-29 LAB — CBC
HCT: 40.1 % (ref 39.0–52.0)
Hemoglobin: 13.6 g/dL (ref 13.0–17.0)
MCH: 31.7 pg (ref 26.0–34.0)
MCHC: 33.9 g/dL (ref 30.0–36.0)
MCV: 93.5 fL (ref 80.0–100.0)
Platelets: 188 10*3/uL (ref 150–400)
RBC: 4.29 MIL/uL (ref 4.22–5.81)
RDW: 13.5 % (ref 11.5–15.5)
WBC: 6.3 10*3/uL (ref 4.0–10.5)
nRBC: 0 % (ref 0.0–0.2)

## 2022-05-29 MED ORDER — PANTOPRAZOLE SODIUM 40 MG IV SOLR
40.0000 mg | Freq: Two times a day (BID) | INTRAVENOUS | Status: DC
  Administered 2022-05-29 – 2022-05-31 (×4): 40 mg via INTRAVENOUS
  Filled 2022-05-29 (×4): qty 10

## 2022-05-29 MED ORDER — DIATRIZOATE MEGLUMINE & SODIUM 66-10 % PO SOLN
90.0000 mL | Freq: Once | ORAL | Status: AC
  Administered 2022-05-29: 90 mL
  Filled 2022-05-29: qty 90

## 2022-05-29 MED ORDER — HYDROMORPHONE HCL 1 MG/ML IJ SOLN
0.5000 mg | INTRAMUSCULAR | Status: DC | PRN
  Administered 2022-05-29 (×6): 0.5 mg via INTRAVENOUS
  Filled 2022-05-29 (×6): qty 0.5

## 2022-05-29 MED ORDER — NALOXONE HCL 0.4 MG/ML IJ SOLN: 0.4000 mg | INTRAMUSCULAR | Status: DC | PRN

## 2022-05-29 MED ORDER — DIATRIZOATE MEGLUMINE & SODIUM 66-10 % PO SOLN
90.0000 mL | Freq: Once | ORAL | Status: AC
  Administered 2022-05-29: 90 mL via NASOGASTRIC
  Filled 2022-05-29: qty 90

## 2022-05-29 NOTE — Progress Notes (Signed)
Subjective: CC: NGT placed this am. Film pending.   Patient reports he feels much better today after large episode of emesis overnight. No further nausea. Abdominal pain resolved but still having some bloating/distension. Passing flatus. Last BM/episode of diarrhea was 2 days ago.   Reports hx of Cholecystectomy and Lap RIH repair.   Objective: Vital signs in last 24 hours: Temp:  [98.1 F (36.7 C)-98.9 F (37.2 C)] 98.9 F (37.2 C) (05/17 0411) Pulse Rate:  [75-105] 91 (05/17 0411) Resp:  [18] 18 (05/17 0411) BP: (120-209)/(65-110) 129/83 (05/17 0411) SpO2:  [95 %-96 %] 96 % (05/17 0035) Weight:  [102.1 kg] 102.1 kg (05/17 0411) Last BM Date : 05/27/22  Intake/Output from previous day: 05/16 0701 - 05/17 0700 In: 1795.4 [I.V.:1795.4] Out: -  Intake/Output this shift: No intake/output data recorded.  PE: Gen:  Alert, NAD, pleasant Abd: Soft, moderate distension, completely NT, small umbilical hernia that is easily reducible.   Lab Results:  Recent Labs    05/27/22 0102 05/29/22 0043  WBC 5.5 6.3  HGB 13.2 13.6  HCT 39.7 40.1  PLT 174 188   BMET Recent Labs    05/27/22 0102 05/29/22 0043  NA 132* 131*  K 3.3* 4.2  CL 102 98  CO2 22 25  GLUCOSE 106* 106*  BUN 22 10  CREATININE 0.96 0.74  CALCIUM 8.5* 8.6*   PT/INR No results for input(s): "LABPROT", "INR" in the last 72 hours. CMP     Component Value Date/Time   NA 131 (L) 05/29/2022 0043   NA 136 02/14/2021 1528   K 4.2 05/29/2022 0043   CL 98 05/29/2022 0043   CO2 25 05/29/2022 0043   GLUCOSE 106 (H) 05/29/2022 0043   BUN 10 05/29/2022 0043   BUN 20 02/14/2021 1528   CREATININE 0.74 05/29/2022 0043   CREATININE 0.88 09/18/2015 1135   CALCIUM 8.6 (L) 05/29/2022 0043   PROT 5.8 (L) 05/29/2022 0043   PROT 6.3 04/04/2015 1501   ALBUMIN 3.1 (L) 05/29/2022 0043   ALBUMIN 4.4 04/04/2015 1501   AST 13 (L) 05/29/2022 0043   ALT 7 05/29/2022 0043   ALKPHOS 44 05/29/2022 0043   BILITOT 1.3  (H) 05/29/2022 0043   BILITOT 0.7 04/04/2015 1501   GFRNONAA >60 05/29/2022 0043   GFRAA >60 08/25/2015 1140   Lipase     Component Value Date/Time   LIPASE 23 05/26/2022 1831    Studies/Results: CT ABDOMEN PELVIS W CONTRAST  Result Date: 05/28/2022 CLINICAL DATA:  Acute abdominal pain EXAM: CT ABDOMEN AND PELVIS WITH CONTRAST TECHNIQUE: Multidetector CT imaging of the abdomen and pelvis was performed using the standard protocol following bolus administration of intravenous contrast. RADIATION DOSE REDUCTION: This exam was performed according to the departmental dose-optimization program which includes automated exposure control, adjustment of the mA and/or kV according to patient size and/or use of iterative reconstruction technique. CONTRAST:  75mL OMNIPAQUE IOHEXOL 350 MG/ML SOLN COMPARISON:  CT chest abdomen and pelvis 02/05/2021 FINDINGS: Lower chest: There is atelectasis in the lung bases. Hepatobiliary: No focal liver abnormality is seen. Status post cholecystectomy. No biliary dilatation. Pancreas: Unremarkable. No pancreatic ductal dilatation or surrounding inflammatory changes. Spleen: Normal in size without focal abnormality. Adrenals/Urinary Tract: Adrenal glands are unremarkable. Kidneys are normal, without renal calculi, focal lesion, or hydronephrosis. Bladder is unremarkable. Stomach/Bowel: There are dilated mid and proximal small bowel loops with air-fluid levels measuring up to 4.6 Cm. There is some associated mesenteric edema. Stomach is  distended with fluid and there is also fluid in the distal esophagus which can be seen with gastroesophageal reflux. A transition point is seen in the central abdomen image 3/68. Distal small bowel and colon are nondilated. There is colonic diverticulosis without evidence for acute diverticulitis. The appendix is within normal limits. There is no pneumatosis or free air. Vascular/Lymphatic: Aortic atherosclerosis. No enlarged abdominal or pelvic  lymph nodes. Reproductive: Prostate is unremarkable. There surgical clips near the prostate gland. Other: There is no ascites or free air. There has been prior right inguinal hernia repair. There are very small fat containing bilateral inguinal and umbilical hernias. Musculoskeletal: L3-S1 posterior fusion hardware is present. IMPRESSION: 1. Findings compatible with small bowel obstruction with transition point in the central abdomen. There is associated mesenteric edema. No pneumatosis or free air. 2. Colonic diverticulosis. Aortic Atherosclerosis (ICD10-I70.0). Electronically Signed   By: Darliss Cheney M.D.   On: 05/28/2022 18:54   NM Myocar Multi W/Spect Izetta Dakin Motion / EF  Result Date: 05/28/2022 CLINICAL DATA:  Chest pain.  Intermediate CAD risk. EXAM: MYOCARDIAL IMAGING WITH SPECT (REST AND PHARMACOLOGIC-STRESS) GATED LEFT VENTRICULAR WALL MOTION STUDY LEFT VENTRICULAR EJECTION FRACTION TECHNIQUE: Standard myocardial SPECT imaging was performed after resting intravenous injection of 9.9 mCi Tc-39m tetrofosmin. Subsequently, intravenous infusion of Lexiscan was performed under the supervision of the Cardiology staff. At peak effect of the drug, 31.4 mCi Tc-104m tetrofosmin was injected intravenously and standard myocardial SPECT imaging was performed. Quantitative gated imaging was also performed to evaluate left ventricular wall motion, and estimate left ventricular ejection fraction. COMPARISON:  None Available. FINDINGS: Perfusion: A small fixed defect is seen in the distal inferolateral wall of the left ventricle on both stress and resting images, likely due to diaphragmatic attenuation artifact. No reversible myocardial perfusion defects are seen to suggest reversible myocardial ischemia. Wall Motion: Normal left ventricular wall motion. No left ventricular dilation. Left Ventricular Ejection Fraction: 48 % End diastolic volume 92 ml End systolic volume 47 ml IMPRESSION: 1. No reversible ischemia.  Probable diaphragmatic attenuation artifact. 2. Normal left ventricular wall motion. 3. Left ventricular ejection fraction 48% 4. Non invasive risk stratification*: Low *2012 Appropriate Use Criteria for Coronary Revascularization Focused Update: J Am Coll Cardiol. 2012;59(9):857-881. http://content.dementiazones.com.aspx?articleid=[email or web address] Electronically Signed   By: Danae Orleans M.D.   On: 05/28/2022 15:11   DG Abd Portable 1V  Result Date: 05/28/2022 CLINICAL DATA:  [id number] Pain in the abdomen [id number] EXAM: PORTABLE ABDOMEN - 1 VIEW COMPARISON:  CT 02/05/2021 FINDINGS: Stomach is nondistended. Multiple gas dilated small bowel loops in the mid abdomen. Colon is nondilated. Cholecystectomy clips. Lumbosacral fixation hardware with adjacent level degenerative disc disease L2-3. IMPRESSION: Dilated small bowel loops suggesting small bowel obstruction. Electronically Signed   By: Corlis Leak M.D.   On: 05/28/2022 14:42   ECHOCARDIOGRAM COMPLETE  Result Date: 05/27/2022    ECHOCARDIOGRAM REPORT   Patient Name:   BLAYDE BOAT Date of Exam: 05/27/2022 Medical Rec #:  [id number]            Height:       73.0 in Accession #:    [id number]           Weight:       220.2 lb Date of Birth:  [birth year]           BSA:          2.241 m Patient Age:    8 years  BP:           110/68 mmHg Patient Gender: M                    HR:           82 bpm. Exam Location:  Inpatient Procedure: 2D Echo, Color Doppler, Cardiac Doppler and Intracardiac            Opacification Agent Indications:    Chest Pain  History:        Patient has prior history of Echocardiogram examinations, most                 recent 02/06/2021. Previous Myocardial Infarction and CAD, OSA                 and Stroke, Arrythmias:RBBB; Risk Factors:Dyslipidemia and                 Hypertension.  Sonographer:    Milbert Coulter Referring Phys: [id number] JENNIFER YATES  Sonographer Comments: Suboptimal apical window. Image acquisition challenging due to  patient body habitus. IMPRESSIONS  1. Left ventricular ejection fraction, by estimation, is 60 to 65%. The left ventricle has normal function. The left ventricle has no regional wall motion abnormalities. There is mild concentric left ventricular hypertrophy. Left ventricular diastolic parameters are consistent with Grade I diastolic dysfunction (impaired relaxation).  2. Right ventricular systolic function is normal. The right ventricular size is not well visualized.  3. The mitral valve was not well visualized. No evidence of mitral valve regurgitation.  4. The aortic valve was not well visualized. Aortic valve regurgitation is not visualized. No aortic stenosis is present. Comparison(s): No significant change from prior study. This study is technically more challenging from prior. FINDINGS  Left Ventricle: Left ventricular ejection fraction, by estimation, is 60 to 65%. The left ventricle has normal function. The left ventricle has no regional wall motion abnormalities. Definity contrast agent was given IV to delineate the left ventricular  endocardial borders. The left ventricular internal cavity size was normal in size. There is mild concentric left ventricular hypertrophy. Left ventricular diastolic parameters are consistent with Grade I diastolic dysfunction (impaired relaxation). Right Ventricle: The right ventricular size is not well visualized. Right vetricular wall thickness was not well visualized. Right ventricular systolic function is normal. Left Atrium: Left atrial size was not well visualized. Right Atrium: Right atrial size was not well visualized. Pericardium: There is no evidence of pericardial effusion. Mitral Valve: The mitral valve was not well visualized. No evidence of mitral valve regurgitation. Tricuspid Valve: The tricuspid valve is not well visualized. Tricuspid valve regurgitation is not demonstrated. Aortic Valve: The aortic valve was not well visualized. Aortic valve regurgitation is  not visualized. No aortic stenosis is present. Aortic valve mean gradient measures 4.0 mmHg. Aortic valve peak gradient measures 7.4 mmHg. Aortic valve area, by VTI measures 2.65 cm. Pulmonic Valve: The pulmonic valve was not well visualized. Pulmonic valve regurgitation is not visualized. Aorta: The aortic root is normal in size and structure and the ascending aorta was not well visualized. IAS/Shunts: The interatrial septum was not well visualized.  LEFT VENTRICLE PLAX 2D LVIDd:         4.50 cm   Diastology LVIDs:         3.30 cm   LV e' medial:    5.98 cm/s LV PW:         1.30 cm   LV E/e' medial:  7.1 LV IVS:  1.20 cm   LV e' lateral:   6.64 cm/s LVOT diam:     2.10 cm   LV E/e' lateral: 6.4 LV SV:         62 LV SV Index:   28 LVOT Area:     3.46 cm  RIGHT VENTRICLE RV S prime:     13.40 cm/s TAPSE (M-mode): 2.0 cm LEFT ATRIUM             Index LA diam:        3.00 cm 1.34 cm/m LA Vol (A2C):   49.7 ml 22.17 ml/m LA Vol (A4C):   43.6 ml 19.45 ml/m LA Biplane Vol: 50.3 ml 22.44 ml/m  AORTIC VALVE AV Area (Vmax):    2.65 cm AV Area (Vmean):   2.52 cm AV Area (VTI):     2.65 cm AV Vmax:           136.00 cm/s AV Vmean:          92.400 cm/s AV VTI:            0.233 m AV Peak Grad:      7.4 mmHg AV Mean Grad:      4.0 mmHg LVOT Vmax:         104.00 cm/s LVOT Vmean:        67.100 cm/s LVOT VTI:          0.178 m LVOT/AV VTI ratio: 0.76  AORTA Ao Root diam: 3.00 cm MITRAL VALVE MV Area (PHT): 4.63 cm    SHUNTS MV Decel Time: 164 msec    Systemic VTI:  0.18 m MV E velocity: 42.40 cm/s  Systemic Diam: 2.10 cm MV A velocity: 83.80 cm/s MV E/A ratio:  0.51 Riley Lam MD Electronically signed by Riley Lam MD Signature Date/Time: 05/27/2022/4:23:01 PM    Final     Anti-infectives: Anti-infectives (From admission, onward)    None        Assessment/Plan SBO - CT w/ SBO w/ transition in central abdomen. There is some associated mesenteric edema. Hx Cholecystectomy and RIH repair  -  Afebrile. No tachycardia or hypotension. WBC 6.3. No peritonitis on exam. No pneumatosis or free air on CT. No current indication for emergency surgery - NGT placed this am for decompression. Xray pending to confirm location. Given this was just placed will proceed with SBO protocol. Keep NPO - Keep K > 4 and Mg > 2 for bowel function - Mobilize for bowel function - Hopefully patient will improve with conservative management. If patient fails to improve with conservative management, they may require exploratory surgery during admission - Agree with medical admission. We will follow with you.  FEN - NPO, NGT to LIWS, IVF per [facility] VTE - SCDs, Lovenox, hold plavix if able ID - None  I reviewed nursing notes, hospitalist notes, last 24 h vitals and pain scores, last 48 h intake and output, last 24 h labs and trends, and last 24 h imaging results.    LOS: 0 days    Jacinto Halim , [facility] 05/29/2022, 8:33 AM Please see Amion for pager number during day hours 7:00am-4:30pm

## 2022-05-29 NOTE — Progress Notes (Signed)
Pateint connected back to low wall suction per MD order

## 2022-05-29 NOTE — Progress Notes (Addendum)
[facility] night cross cover note:   I was notified by [facility] radiology tha the patient's CT abdomen/pelvis demonstrated evidence of small bowel obstruction with transition point in the absence of any evidence of bowel perforation or abscess.   I subsequently discussed patient's case/imaging with patient's RN, who conveyed that the patient has not been experiencing any nausea/vomiting.  In light of finding of small bowel obstruction, I have made the patient strict NPO.  He is on continuous NS running at 100 cc/h.  I have also changed his prn p.o. analgesics to prn IV Dilaudid.  He also has an existing order for as needed IV Zofran for nausea.  Also reviewed his existing orders for p.o. medications and subsequently changed his p.o. Protonix to IV route of administration.  Per my chart review, general surgery has already been consulted and are actively following.  Per general surgery recommendations/order, will pursue NGT.     Newton Pigg, DO Hospitalist

## 2022-05-29 NOTE — Plan of Care (Signed)
  Problem: Education: Goal: Knowledge of General Education information will improve Description Including pain rating scale, medication(s)/side effects and non-pharmacologic comfort measures Outcome: Progressing   Problem: Activity: Goal: Risk for activity intolerance will decrease Outcome: Progressing   Problem: Safety: Goal: Ability to remain free from injury will improve Outcome: Progressing   

## 2022-05-29 NOTE — Progress Notes (Signed)
Around 1800, patient began having some bright red blood coming from NG tube. Dr. Jonathon Bellows notified who ordered ABD x-ray and IV protonix. Low-intermittent wall suction paused at this time to prevent additional harm. Patient complains of discomfort around mouth/throat area from NG tube which has been ongoing today, but otherwise states he feels fine.

## 2022-05-29 NOTE — Progress Notes (Signed)
Rounding Note    Patient Name: Joshua [name] Date of Encounter: 05/29/2022  [facility] Cardiologist: Donato Schultz, MD   Subjective   Escalating abdominal discomfort and then vomiting yesterday, abdominal films c/w SBO. No chest discomfort.  Inpatient Medications    Scheduled Meds:  aspirin EC  81 mg Oral Daily   azelastine  1 spray Each Nare QHS   And   fluticasone  1 spray Each Nare QHS   carbidopa-levodopa  1 tablet Oral QHS   carbidopa-levodopa  2 tablet Oral 5 times per day   clopidogrel  75 mg Oral Daily   docusate sodium  100 mg Oral BID   enoxaparin (LOVENOX) injection  40 mg Subcutaneous Q24H   escitalopram  20 mg Oral Daily   finasteride  5 mg Oral QPM   loratadine  10 mg Oral QPM   mexiletine  150 mg Oral BID   montelukast  10 mg Oral QHS   pantoprazole (PROTONIX) IV  40 mg Intravenous Q24H   regadenoson  0.4 mg Intravenous Once   rosuvastatin  20 mg Oral QHS   saccharomyces boulardii  250 mg Oral BID   sodium chloride flush  3 mL Intravenous Q12H   tamsulosin  0.4 mg Oral QPC supper   zolpidem  5 mg Oral QHS   Continuous Infusions:  sodium chloride 100 mL/hr at 05/28/22 2320   PRN Meds: acetaminophen **OR** acetaminophen, alum & mag hydroxide-simeth, bisacodyl, hydrALAZINE, HYDROmorphone (DILAUDID) injection, loperamide, naLOXone (NARCAN)  injection, ondansetron **OR** ondansetron (ZOFRAN) IV, mouth rinse, polyethylene glycol   Vital Signs    Vitals:   05/28/22 1131 05/28/22 2020 05/29/22 0035 05/29/22 0411  BP: (!) 142/95 (!) 155/94 (!) 150/93 129/83  Pulse: 85 75 76 91  Resp: 18 18 18 18   Temp: 98.1 F (36.7 C) 98.7 F (37.1 C) 98.5 F (36.9 C) 98.9 F (37.2 C)  TempSrc: Oral Oral Oral Oral  SpO2: 95% 96% 96%   Weight:    102.1 kg  Height:        Intake/Output Summary (Last 24 hours) at 05/29/2022 0850 Last data filed at 05/29/2022 0600 Gross per 24 hour  Intake 1795.38 ml  Output --  Net 1795.38 ml      05/29/2022     4:11 AM 05/28/2022    6:32 AM 05/27/2022    5:35 AM  Last 3 Weights  Weight (lbs) 225 lb 1.6 oz 223 lb 3.2 oz 220 lb 3.2 oz  Weight (kg) 102.105 kg 101.243 kg 99.882 kg      Telemetry    NSR - Personally Reviewed  ECG    No new tracing - Personally Reviewed  Physical Exam  NGT, curretly appears comfortable GEN: No acute distress.   Neck: No JVD Cardiac: RRR, no murmurs, rubs, or gallops.  Respiratory: Clear to auscultation bilaterally. GI: mildy distended, weak BS. MS: No edema; No deformity. Neuro:  Nonfocal  Psych: Normal affect   Labs    High Sensitivity Troponin:   Recent Labs  Lab 05/26/22 0933 05/26/22 1133  TROPONINIHS 6 6     Chemistry Recent Labs  Lab 05/26/22 0933 05/27/22 0102 05/29/22 0043  NA 137 132* 131*  K 4.0 3.3* 4.2  CL 104 102 98  CO2 21* 22 25  GLUCOSE 127* 106* 106*  BUN 20 22 10   CREATININE 0.78 0.96 0.74  CALCIUM 9.1 8.5* 8.6*  PROT  --   --  5.8*  ALBUMIN  --   --  3.1*  AST  --   --  13*  ALT  --   --  7  ALKPHOS  --   --  44  BILITOT  --   --  1.3*  GFRNONAA >60 >60 >60  ANIONGAP 12 8 8     Lipids  Recent Labs  Lab 05/26/22 1831  CHOL 85  TRIG 80  HDL 46  LDLCALC 23  CHOLHDL 1.8    Hematology Recent Labs  Lab 05/26/22 0933 05/27/22 0102 05/29/22 0043  WBC 5.1 5.5 6.3  RBC 4.79 4.09* 4.29  HGB 15.7 13.2 13.6  HCT 46.4 39.7 40.1  MCV 96.9 97.1 93.5  MCH 32.8 32.3 31.7  MCHC 33.8 33.2 33.9  RDW 13.4 13.5 13.5  PLT 197 174 188   Thyroid  Recent Labs  Lab 05/26/22 1831  TSH 0.893    BNP Recent Labs  Lab 05/26/22 0955  BNP 33.4    DDimer No results for input(s): "DDIMER" in the last 168 hours.   Radiology    CT ABDOMEN PELVIS W CONTRAST  Result Date: 05/28/2022 CLINICAL DATA:  Acute abdominal pain EXAM: CT ABDOMEN AND PELVIS WITH CONTRAST TECHNIQUE: Multidetector CT imaging of the abdomen and pelvis was performed using the standard protocol following bolus administration of intravenous contrast.  RADIATION DOSE REDUCTION: This exam was performed according to the departmental dose-optimization program which includes automated exposure control, adjustment of the mA and/or kV according to patient size and/or use of iterative reconstruction technique. CONTRAST:  75mL OMNIPAQUE IOHEXOL 350 MG/ML SOLN COMPARISON:  CT chest abdomen and pelvis 02/05/2021 FINDINGS: Lower chest: There is atelectasis in the lung bases. Hepatobiliary: No focal liver abnormality is seen. Status post cholecystectomy. No biliary dilatation. Pancreas: Unremarkable. No pancreatic ductal dilatation or surrounding inflammatory changes. Spleen: Normal in size without focal abnormality. Adrenals/Urinary Tract: Adrenal glands are unremarkable. Kidneys are normal, without renal calculi, focal lesion, or hydronephrosis. Bladder is unremarkable. Stomach/Bowel: There are dilated mid and proximal small bowel loops with air-fluid levels measuring up to 4.6 Cm. There is some associated mesenteric edema. Stomach is distended with fluid and there is also fluid in the distal esophagus which can be seen with gastroesophageal reflux. A transition point is seen in the central abdomen image 3/68. Distal small bowel and colon are nondilated. There is colonic diverticulosis without evidence for acute diverticulitis. The appendix is within normal limits. There is no pneumatosis or free air. Vascular/Lymphatic: Aortic atherosclerosis. No enlarged abdominal or pelvic lymph nodes. Reproductive: Prostate is unremarkable. There surgical clips near the prostate gland. Other: There is no ascites or free air. There has been prior right inguinal hernia repair. There are very small fat containing bilateral inguinal and umbilical hernias. Musculoskeletal: L3-S1 posterior fusion hardware is present. IMPRESSION: 1. Findings compatible with small bowel obstruction with transition point in the central abdomen. There is associated mesenteric edema. No pneumatosis or free air. 2.  Colonic diverticulosis. Aortic Atherosclerosis (ICD10-I70.0). Electronically Signed   By: Darliss Cheney M.D.   On: 05/28/2022 18:54   NM Myocar Multi W/Spect Izetta Dakin Motion / EF  Result Date: 05/28/2022 CLINICAL DATA:  Chest pain.  Intermediate CAD risk. EXAM: MYOCARDIAL IMAGING WITH SPECT (REST AND PHARMACOLOGIC-STRESS) GATED LEFT VENTRICULAR WALL MOTION STUDY LEFT VENTRICULAR EJECTION FRACTION TECHNIQUE: Standard myocardial SPECT imaging was performed after resting intravenous injection of 9.9 mCi Tc-32m tetrofosmin. Subsequently, intravenous infusion of Lexiscan was performed under the supervision of the Cardiology staff. At peak effect of the drug, 31.4 mCi Tc-31m tetrofosmin was injected  intravenously and standard myocardial SPECT imaging was performed. Quantitative gated imaging was also performed to evaluate left ventricular wall motion, and estimate left ventricular ejection fraction. COMPARISON:  None Available. FINDINGS: Perfusion: A small fixed defect is seen in the distal inferolateral wall of the left ventricle on both stress and resting images, likely due to diaphragmatic attenuation artifact. No reversible myocardial perfusion defects are seen to suggest reversible myocardial ischemia. Wall Motion: Normal left ventricular wall motion. No left ventricular dilation. Left Ventricular Ejection Fraction: 48 % End diastolic volume 92 ml End systolic volume 47 ml IMPRESSION: 1. No reversible ischemia. Probable diaphragmatic attenuation artifact. 2. Normal left ventricular wall motion. 3. Left ventricular ejection fraction 48% 4. Non invasive risk stratification*: Low *2012 Appropriate Use Criteria for Coronary Revascularization Focused Update: J Am Coll Cardiol. 2012;59(9):857-881. http://content.dementiazones.com.aspx?articleid=[email or web address] Electronically Signed   By: Danae Orleans M.D.   On: 05/28/2022 15:11   DG Abd Portable 1V  Result Date: 05/28/2022 CLINICAL DATA:  [id number] Pain in the abdomen  [id number] EXAM: PORTABLE ABDOMEN - 1 VIEW COMPARISON:  CT 02/05/2021 FINDINGS: Stomach is nondistended. Multiple gas dilated small bowel loops in the mid abdomen. Colon is nondilated. Cholecystectomy clips. Lumbosacral fixation hardware with adjacent level degenerative disc disease L2-3. IMPRESSION: Dilated small bowel loops suggesting small bowel obstruction. Electronically Signed   By: Corlis Leak M.D.   On: 05/28/2022 14:42   ECHOCARDIOGRAM COMPLETE  Result Date: 05/27/2022    ECHOCARDIOGRAM REPORT   Patient Name:   JULEAN WIEDRICH Date of Exam: 05/27/2022 Medical Rec #:  [id number]            Height:       73.0 in Accession #:    [id number]           Weight:       220.2 lb Date of Birth:  02/13/1945           BSA:          2.241 m Patient Age:    76 years             BP:           110/68 mmHg Patient Gender: M                    HR:           82 bpm. Exam Location:  Inpatient Procedure: 2D Echo, Color Doppler, Cardiac Doppler and Intracardiac            Opacification Agent Indications:    Chest Pain  History:        Patient has prior history of Echocardiogram examinations, most                 recent 02/06/2021. Previous Myocardial Infarction and CAD, OSA                 and Stroke, Arrythmias:RBBB; Risk Factors:Dyslipidemia and                 Hypertension.  Sonographer:    Milbert Coulter Referring Phys: [id number] JENNIFER YATES  Sonographer Comments: Suboptimal apical window. Image acquisition challenging due to patient body habitus. IMPRESSIONS  1. Left ventricular ejection fraction, by estimation, is 60 to 65%. The left ventricle has normal function. The left ventricle has no regional wall motion abnormalities. There is mild concentric left ventricular hypertrophy. Left ventricular diastolic parameters are consistent with Grade I diastolic dysfunction (impaired relaxation).  2. Right ventricular systolic function is normal.  The right ventricular size is not well visualized.  3. The mitral valve was not well  visualized. No evidence of mitral valve regurgitation.  4. The aortic valve was not well visualized. Aortic valve regurgitation is not visualized. No aortic stenosis is present. Comparison(s): No significant change from prior study. This study is technically more challenging from prior. FINDINGS  Left Ventricle: Left ventricular ejection fraction, by estimation, is 60 to 65%. The left ventricle has normal function. The left ventricle has no regional wall motion abnormalities. Definity contrast agent was given IV to delineate the left ventricular  endocardial borders. The left ventricular internal cavity size was normal in size. There is mild concentric left ventricular hypertrophy. Left ventricular diastolic parameters are consistent with Grade I diastolic dysfunction (impaired relaxation). Right Ventricle: The right ventricular size is not well visualized. Right vetricular wall thickness was not well visualized. Right ventricular systolic function is normal. Left Atrium: Left atrial size was not well visualized. Right Atrium: Right atrial size was not well visualized. Pericardium: There is no evidence of pericardial effusion. Mitral Valve: The mitral valve was not well visualized. No evidence of mitral valve regurgitation. Tricuspid Valve: The tricuspid valve is not well visualized. Tricuspid valve regurgitation is not demonstrated. Aortic Valve: The aortic valve was not well visualized. Aortic valve regurgitation is not visualized. No aortic stenosis is present. Aortic valve mean gradient measures 4.0 mmHg. Aortic valve peak gradient measures 7.4 mmHg. Aortic valve area, by VTI measures 2.65 cm. Pulmonic Valve: The pulmonic valve was not well visualized. Pulmonic valve regurgitation is not visualized. Aorta: The aortic root is normal in size and structure and the ascending aorta was not well visualized. IAS/Shunts: The interatrial septum was not well visualized.  LEFT VENTRICLE PLAX 2D LVIDd:         4.50 cm    Diastology LVIDs:         3.30 cm   LV e' medial:    5.98 cm/s LV PW:         1.30 cm   LV E/e' medial:  7.1 LV IVS:        1.20 cm   LV e' lateral:   6.64 cm/s LVOT diam:     2.10 cm   LV E/e' lateral: 6.4 LV SV:         62 LV SV Index:   28 LVOT Area:     3.46 cm  RIGHT VENTRICLE RV S prime:     13.40 cm/s TAPSE (M-mode): 2.0 cm LEFT ATRIUM             Index LA diam:        3.00 cm 1.34 cm/m LA Vol (A2C):   49.7 ml 22.17 ml/m LA Vol (A4C):   43.6 ml 19.45 ml/m LA Biplane Vol: 50.3 ml 22.44 ml/m  AORTIC VALVE AV Area (Vmax):    2.65 cm AV Area (Vmean):   2.52 cm AV Area (VTI):     2.65 cm AV Vmax:           136.00 cm/s AV Vmean:          92.400 cm/s AV VTI:            0.233 m AV Peak Grad:      7.4 mmHg AV Mean Grad:      4.0 mmHg LVOT Vmax:         104.00 cm/s LVOT Vmean:        67.100 cm/s LVOT VTI:  0.178 m LVOT/AV VTI ratio: 0.76  AORTA Ao Root diam: 3.00 cm MITRAL VALVE MV Area (PHT): 4.63 cm    SHUNTS MV Decel Time: 164 msec    Systemic VTI:  0.18 m MV E velocity: 42.40 cm/s  Systemic Diam: 2.10 cm MV A velocity: 83.80 cm/s MV E/A ratio:  0.51 Riley Lam MD Electronically signed by Riley Lam MD Signature Date/Time: 05/27/2022/4:23:01 PM    Final     Cardiac Studies   Nuclear stress test 05/28/2022  IMPRESSION: 1. No reversible ischemia. Probable diaphragmatic attenuation artifact.   2. Normal left ventricular wall motion.   3. Left ventricular ejection fraction 48%   4. Non invasive risk stratification*: Low    ECHO 05/27/2022   1. Left ventricular ejection fraction, by estimation, is 60 to 65%. The  left ventricle has normal function. The left ventricle has no regional  wall motion abnormalities. There is mild concentric left ventricular  hypertrophy. Left ventricular diastolic  parameters are consistent with Grade I diastolic dysfunction (impaired  relaxation).   2. Right ventricular systolic function is normal. The right ventricular  size is  not well visualized.   3. The mitral valve was not well visualized. No evidence of mitral valve  regurgitation.   4. The aortic valve was not well visualized. Aortic valve regurgitation  is not visualized. No aortic stenosis is present.   Comparison(s): No significant change from prior study. This study is  technically more challenging from prior.      Cardiac catheterization 02/05/2021     Prox LAD to Mid LAD lesion is 30% stenosed.   1st Diag lesion is 70% stenosed.   Mid LAD-1 lesion is 40% stenosed.   Mid LAD-2 lesion is 60% stenosed.   Dist LAD lesion is 80% stenosed.   Ramus-1 lesion is 60% stenosed.   Ramus-2 lesion is 80% stenosed.   There is moderate left ventricular systolic dysfunction.   LV end diastolic pressure is mildly elevated.   The left ventricular ejection fraction is 35-45% by visual estimate.   1.  No clear culprit is identified for non-ST elevation myocardial infarction with no significant disease affecting the main vessels.  He does have distal LAD stenosis as well as distal ramus stenosis supplying small segments and diagonal disease.  Initially, the LAD was thought to be occluded but in reality, it had separate ostium from the left circumflex and I was able to selectively engage the LAD.   2.  Moderately reduced LV systolic function 5 to 40%.  Possible distal anterior and apical hypokinesis.   Recommendations: Medical therapy with nitroglycerin drip for pain control.  Resume heparin drip 6 hours. Obtain an echocardiogram to evaluate for possible stress-induced cardiomyopathy.    Dominance: Right   Patient Profile     77 y.o. male 77 y.o. male with a known CAD involving secondary branches and distal LAD presenting with symptoms suggesting unstable angina, but with low risk biomarkers and ECG. Normal wall motion on echo and low risk nuclear study. Subsequently developed more clear evidence of SBO.    Assessment & Plan    Hx of previous lap chole and  lap R inguinal hernia repair, may have adhesions. Benign findings on extensive cardiac w/u on this admission. Low risk for major CV complications should he fail conservative management of SBO and require surgery. [facility] will sign off.   Medication Recommendations:  currently NPO. Only cardiac meds are mexiletine (for PVCs), ASA, very low dose metoprolol, statin.  All can be held temporarily. May see more PVCs on telemetry. Other recommendations (labs, testing, etc):  at high risk for severe orthostatic hypotension (autonomic dysfunction, unable to take midodrine, possibly hypovolemic with third spacing of fluids, etc.) Follow up as an outpatient:  Already has FU scheduled 05/29 and 06/10.  For questions or updates, please contact [facility] Please consult [email or web address] for contact info under        Signed, Thurmon Fair, MD  05/29/2022, 8:50 AM

## 2022-05-29 NOTE — Progress Notes (Signed)
PROGRESS NOTE Joshua [name]  ZOX:[id number] DOB: 07/09/1945 DOA: 05/26/2022 PCP: [name], MD  Brief Narrative/Hospital Course: 77 y.o.m w/ Parkinson's, prostate CA, HLD, HTN, preDM, and TIA, NSTEMI in January 2023  w/ Cath showing small vessel diagonal/OM and distal LAD dx treated medically-TTE 02/06/21 showed normalization of EF, history of PVC on mexiletine followed by Dr. Elberta Fortis presentied with chest pain/epigastric pain and shortness of breath since 3 AM after doing yard work earlier in the day. He was recently placed on new medication for Parkinson's about a week ago (ropinirole).  He sees [facility] and had a recent colonoscopy and endoscopy (4/10) EGD with HH, fundic gland polyps, and mild esophageal dilation, pathology was unremarkable. In the ED -vitals with mild tachycardia initial BP 228/212> subsequent blood pressure normal in 90s to 100.labs stable CBC BMP troponin negative x 2 BNP 33. Seen by cardiology admitted for further management Further labs with LDL 23, HDL 46 HbA1c 5.3 TSH 0.8, mild hypokalemia and hyponatremia He was admitted for further workup.  Underwent echocardiogram that showed normal EF/wall motion and cardiology did nuclear perfusion study 5/16 to evaluate further. Cardiology considering empiric vasodilator therapy for possible coronary vasospasm but the risk of worsening orthostatic hypotension appears to outweigh the benefits.    Subjective: Patient seen and examined this morning Overnight CT scan confirmed bowel obstruction, NG tube was placed and seen by [facility] Reports he passed gas overnight, abdomen distended but no pain today Denies chest pain or shortness of breath.  Assessment and Plan:  SBO: Patient was having diarrhea for few days and was having epigastric pain with history of GERD, workup with normal lipase LFTs, x-ray and CT scan confirmed SBO, NG tube placed overnight, continue n.p.o., IV fluids, management as per general surgery.   Input appreciated cont SBO protocol.  C diff pending  Chest pain: Symptoms on admission concerning for acute coronary syndrome with history of ST elevation MI in 2023. Echo normal and underwent a nuclear stress test > which is unremarkable, no further intervention as per cardiology.  STEMI in January 2023  w/ Cath showing small vessel diagonal/OM and distal LAD dx treated medically-TTE 02/06/21 showed normalization of EF, patient currently on aspirin 81, Plavix 75, Crestor.  history of PVC on mexiletine followed by Dr. Elberta Fortis -okay to hold while n.p.o. per cardiology  Parkinson's disease: Appears stable, continue Sinemet Celexa holding ropinirole due to concern for diarrhea.> Follow-up with PCP/neurology  BPH/Prostate CA history: Continue finasteride tamsulosin follow-up with urology  HLD: Continue Crestor LDL stable at 23.   HTN: BP stable not on meds  preDM: HbA1c 5.3.  DVT prophylaxis: enoxaparin (LOVENOX) injection 40 mg Start: 05/26/22 2200 Code Status:   Code Status: Full Code Family Communication: plan of care discussed with patient at bedside. Patient status is:  admitted as observation > changed to inpatient due to SBO  Level of care: Telemetry Cardiac  Dispo: The patient is from: HOME W/ WIFE             Anticipated disposition: once SBO resolves Objective: Vitals last 24 hrs: Vitals:   05/28/22 1131 05/28/22 2020 05/29/22 0035 05/29/22 0411  BP: (!) 142/95 (!) 155/94 (!) 150/93 129/83  Pulse: 85 75 76 91  Resp: 18 18 18 18   Temp: 98.1 F (36.7 C) 98.7 F (37.1 C) 98.5 F (36.9 C) 98.9 F (37.2 C)  TempSrc: Oral Oral Oral Oral  SpO2: 95% 96% 96%   Weight:    102.1 kg  Height:  Weight change: 0.862 kg  Physical Examination: General exam: AAOX3, weak,older appearing HEENT:Oral mucosa moist, Ear/Nose WNL grossly, dentition normal. Respiratory system: bilaterally CLEAR BS, no use of accessory muscle Cardiovascular system: S1 & S2 +, regular  rate. Gastrointestinal system: Abdomen soft, distended, BS hyperactive NT Nervous System:Alert, awake, moving extremities and grossly nonfocal Extremities: LE ankle edema neg, lower extremities warm Skin: No rashes,no icterus. MSK: Normal muscle bulk,tone, power   Medications reviewed:  Scheduled Meds:  aspirin EC  81 mg Oral Daily   azelastine  1 spray Each Nare QHS   And   fluticasone  1 spray Each Nare QHS   carbidopa-levodopa  1 tablet Oral QHS   carbidopa-levodopa  2 tablet Oral 5 times per day   clopidogrel  75 mg Oral Daily   diatrizoate meglumine-sodium  90 mL Per NG tube Once   docusate sodium  100 mg Oral BID   enoxaparin (LOVENOX) injection  40 mg Subcutaneous Q24H   escitalopram  20 mg Oral Daily   finasteride  5 mg Oral QPM   loratadine  10 mg Oral QPM   mexiletine  150 mg Oral BID   montelukast  10 mg Oral QHS   pantoprazole (PROTONIX) IV  40 mg Intravenous Q24H   regadenoson  0.4 mg Intravenous Once   rosuvastatin  20 mg Oral QHS   saccharomyces boulardii  250 mg Oral BID   sodium chloride flush  3 mL Intravenous Q12H   tamsulosin  0.4 mg Oral QPC supper   zolpidem  5 mg Oral QHS  Continuous Infusions:  sodium chloride 100 mL/hr at 05/29/22 1009    Diet Order             Diet NPO time specified  Diet effective now                  Intake/Output Summary (Last 24 hours) at 05/29/2022 1028 Last data filed at 05/29/2022 0600 Gross per 24 hour  Intake 1795.38 ml  Output --  Net 1795.38 ml   Net IO Since Admission: 4,371.56 mL [05/29/22 1028]  Wt Readings from Last 3 Encounters:  05/29/22 102.1 kg  05/21/22 102.9 kg  04/08/22 101.3 kg     Unresulted Labs (From admission, onward)     Start     Ordered   05/29/22 0500  CBC  Daily,   R     Question:  Specimen collection method  Answer:  Lab=Lab collect   05/28/22 1420   05/29/22 0500  Comprehensive metabolic panel  Daily,   R     Question:  Specimen collection method  Answer:  Lab=Lab collect    05/28/22 1420   05/28/22 1346  C Difficile Quick Screen w PCR reflex  (C Difficile quick screen w PCR reflex panel )  Once, for 24 hours,   TIMED       References:    CDiff Information Tool   05/28/22 1345          Data Reviewed: I have personally reviewed following labs and imaging studies CBC: Recent Labs  Lab 05/26/22 0933 05/27/22 0102 05/29/22 0043  WBC 5.1 5.5 6.3  HGB 15.7 13.2 13.6  HCT 46.4 39.7 40.1  MCV 96.9 97.1 93.5  PLT 197 174 188   Basic Metabolic Panel: Recent Labs  Lab 05/26/22 0933 05/27/22 0102 05/29/22 0043  NA 137 132* 131*  K 4.0 3.3* 4.2  CL 104 102 98  CO2 21* 22 25  GLUCOSE 127* 106*  106*  BUN 20 22 10   CREATININE 0.78 0.96 0.74  CALCIUM 9.1 8.5* 8.6*   Recent Labs  Lab 05/29/22 0043  AST 13*  ALT 7  ALKPHOS 44  BILITOT 1.3*  PROT 5.8*  ALBUMIN 3.1*   Recent Labs  Lab 05/26/22 1831  LIPASE 23   Recent Labs    05/26/22 1831  HGBA1C 5.3  CBG: No results for input(s): "GLUCAP" in the last 168 hours. Lipid Profile: Recent Labs    05/26/22 1831  CHOL 85  HDL 46  LDLCALC 23  TRIG 80  CHOLHDL 1.8   Thyroid Function Tests: Recent Labs    05/26/22 1831  TSH 0.893   Sepsis Labs: No results for input(s): "PROCALCITON", "LATICACIDVEN" in the last 168 hours.  Recent Results (from the past 240 hour(s))  Resp panel by RT-PCR (RSV, Flu A&B, Covid) Anterior Nasal Swab     Status: None   Collection Time: 05/26/22  3:35 PM   Specimen: Anterior Nasal Swab  Result Value Ref Range Status   SARS Coronavirus 2 by RT PCR NEGATIVE NEGATIVE Final    Comment: (NOTE) SARS-CoV-2 target nucleic acids are NOT DETECTED.  The SARS-CoV-2 RNA is generally detectable in upper respiratory specimens during the acute phase of infection. The lowest concentration of SARS-CoV-2 viral copies this assay can detect is 138 copies/mL. A negative result does not preclude SARS-Cov-2 infection and should not be used as the sole basis for treatment  or other patient management decisions. A negative result may occur with  improper specimen collection/handling, submission of specimen other than nasopharyngeal swab, presence of viral mutation(s) within the areas targeted by this assay, and inadequate number of viral copies(<138 copies/mL). A negative result must be combined with clinical observations, patient history, and epidemiological information. The expected result is Negative.  Fact Sheet for Patients:  BloggerCourse.com  Fact Sheet for Healthcare Providers:  SeriousBroker.it  This test is no t yet approved or cleared by the [place] FDA and  has been authorized for detection and/or diagnosis of SARS-CoV-2 by FDA under an Emergency Use Authorization (EUA). This EUA will remain  in effect (meaning this test can be used) for the duration of the COVID-19 declaration under Section 564(b)(1) of the Act, 21 U.S.C.section 360bbb-3(b)(1), unless the authorization is terminated  or revoked sooner.       Influenza A by PCR NEGATIVE NEGATIVE Final   Influenza B by PCR NEGATIVE NEGATIVE Final    Comment: (NOTE) The Xpert Xpress SARS-CoV-2/FLU/RSV plus assay is intended as an aid in the diagnosis of influenza from Nasopharyngeal swab specimens and should not be used as a sole basis for treatment. Nasal washings and aspirates are unacceptable for Xpert Xpress SARS-CoV-2/FLU/RSV testing.  Fact Sheet for Patients: BloggerCourse.com  Fact Sheet for Healthcare Providers: SeriousBroker.it  This test is not yet approved or cleared by the [place] FDA and has been authorized for detection and/or diagnosis of SARS-CoV-2 by FDA under an Emergency Use Authorization (EUA). This EUA will remain in effect (meaning this test can be used) for the duration of the COVID-19 declaration under Section 564(b)(1) of the Act, 21 U.S.C. section  360bbb-3(b)(1), unless the authorization is terminated or revoked.     Resp Syncytial Virus by PCR NEGATIVE NEGATIVE Final    Comment: (NOTE) Fact Sheet for Patients: BloggerCourse.com  Fact Sheet for Healthcare Providers: SeriousBroker.it  This test is not yet approved or cleared by the [place] FDA and has been authorized for detection and/or diagnosis of  SARS-CoV-2 by FDA under an Emergency Use Authorization (EUA). This EUA will remain in effect (meaning this test can be used) for the duration of the COVID-19 declaration under Section 564(b)(1) of the Act, 21 U.S.C. section 360bbb-3(b)(1), unless the authorization is terminated or revoked.  Performed at [facility], [address], [place], [place] [address]     Antimicrobials: Anti-infectives (From admission, onward)    None      Culture/Microbiology No results found for: "SDES", "SPECREQUEST", "CULT", "REPTSTATUS"  Radiology Studies: DG Abd Portable 1V-Small Bowel Protocol-Position Verification  Result Date: 05/29/2022 CLINICAL DATA:  [id number] Encounter for imaging study to confirm nasogastric (NG) tube placement [id number] EXAM: PORTABLE ABDOMEN - 1 VIEW COMPARISON:  Radiograph 05/29/2022 FINDINGS: Nasogastric tube side port overlies the proximal stomach, similar to prior. There is slight increased slack in the distal esophagus. Persistent mildly dilated loops of small bowel in the central abdomen. IMPRESSION: Nasogastric tube side port overlies the proximal stomach, similar to prior. Persistent multiple dilated loops of small bowel compatible with obstruction. Electronically Signed   By: Caprice Renshaw M.D.   On: 05/29/2022 09:57   DG Abd Portable 1V  Result Date: 05/29/2022 CLINICAL DATA:  [id number] Encounter for nasogastric (NG) tube placement [id number] EXAM: PORTABLE ABDOMEN - 1 VIEW COMPARISON:  CT 05/28/2022 FINDINGS: Nasogastric tube tip and side port  overlie the proximal stomach, side-port near the region of the GE junction. Persistent mildly dilated loops of small bowel in the central abdomen. IMPRESSION: Nasogastric tube tip and side port overlie the proximal stomach, side port near the region of the GE junction. Could advance by 5.0 cm to ensure adequate placement. Electronically Signed   By: Caprice Renshaw M.D.   On: 05/29/2022 09:19   CT ABDOMEN PELVIS W CONTRAST  Result Date: 05/28/2022 CLINICAL DATA:  Acute abdominal pain EXAM: CT ABDOMEN AND PELVIS WITH CONTRAST TECHNIQUE: Multidetector CT imaging of the abdomen and pelvis was performed using the standard protocol following bolus administration of intravenous contrast. RADIATION DOSE REDUCTION: This exam was performed according to the departmental dose-optimization program which includes automated exposure control, adjustment of the mA and/or kV according to patient size and/or use of iterative reconstruction technique. CONTRAST:  75mL OMNIPAQUE IOHEXOL 350 MG/ML SOLN COMPARISON:  CT chest abdomen and pelvis 02/05/2021 FINDINGS: Lower chest: There is atelectasis in the lung bases. Hepatobiliary: No focal liver abnormality is seen. Status post cholecystectomy. No biliary dilatation. Pancreas: Unremarkable. No pancreatic ductal dilatation or surrounding inflammatory changes. Spleen: Normal in size without focal abnormality. Adrenals/Urinary Tract: Adrenal glands are unremarkable. Kidneys are normal, without renal calculi, focal lesion, or hydronephrosis. Bladder is unremarkable. Stomach/Bowel: There are dilated mid and proximal small bowel loops with air-fluid levels measuring up to 4.6 Cm. There is some associated mesenteric edema. Stomach is distended with fluid and there is also fluid in the distal esophagus which can be seen with gastroesophageal reflux. A transition point is seen in the central abdomen image 3/68. Distal small bowel and colon are nondilated. There is colonic diverticulosis without  evidence for acute diverticulitis. The appendix is within normal limits. There is no pneumatosis or free air. Vascular/Lymphatic: Aortic atherosclerosis. No enlarged abdominal or pelvic lymph nodes. Reproductive: Prostate is unremarkable. There surgical clips near the prostate gland. Other: There is no ascites or free air. There has been prior right inguinal hernia repair. There are very small fat containing bilateral inguinal and umbilical hernias. Musculoskeletal: L3-S1 posterior fusion hardware is present. IMPRESSION: 1. Findings compatible with small bowel obstruction  with transition point in the central abdomen. There is associated mesenteric edema. No pneumatosis or free air. 2. Colonic diverticulosis. Aortic Atherosclerosis (ICD10-I70.0). Electronically Signed   By: Darliss Cheney M.D.   On: 05/28/2022 18:54   NM Myocar Multi W/Spect Izetta Dakin Motion / EF  Result Date: 05/28/2022 CLINICAL DATA:  Chest pain.  Intermediate CAD risk. EXAM: MYOCARDIAL IMAGING WITH SPECT (REST AND PHARMACOLOGIC-STRESS) GATED LEFT VENTRICULAR WALL MOTION STUDY LEFT VENTRICULAR EJECTION FRACTION TECHNIQUE: Standard myocardial SPECT imaging was performed after resting intravenous injection of 9.9 mCi Tc-14m tetrofosmin. Subsequently, intravenous infusion of Lexiscan was performed under the supervision of the Cardiology staff. At peak effect of the drug, 31.4 mCi Tc-61m tetrofosmin was injected intravenously and standard myocardial SPECT imaging was performed. Quantitative gated imaging was also performed to evaluate left ventricular wall motion, and estimate left ventricular ejection fraction. COMPARISON:  None Available. FINDINGS: Perfusion: A small fixed defect is seen in the distal inferolateral wall of the left ventricle on both stress and resting images, likely due to diaphragmatic attenuation artifact. No reversible myocardial perfusion defects are seen to suggest reversible myocardial ischemia. Wall Motion: Normal left  ventricular wall motion. No left ventricular dilation. Left Ventricular Ejection Fraction: 48 % End diastolic volume 92 ml End systolic volume 47 ml IMPRESSION: 1. No reversible ischemia. Probable diaphragmatic attenuation artifact. 2. Normal left ventricular wall motion. 3. Left ventricular ejection fraction 48% 4. Non invasive risk stratification*: Low *2012 Appropriate Use Criteria for Coronary Revascularization Focused Update: J Am Coll Cardiol. 2012;59(9):857-881. http://content.dementiazones.com.aspx?articleid=1201161 Electronically Signed   By: Danae Orleans M.D.   On: 05/28/2022 15:11   DG Abd Portable 1V  Result Date: 05/28/2022 CLINICAL DATA:  [id number] Pain in the abdomen [id number] EXAM: PORTABLE ABDOMEN - 1 VIEW COMPARISON:  CT 02/05/2021 FINDINGS: Stomach is nondistended. Multiple gas dilated small bowel loops in the mid abdomen. Colon is nondilated. Cholecystectomy clips. Lumbosacral fixation hardware with adjacent level degenerative disc disease L2-3. IMPRESSION: Dilated small bowel loops suggesting small bowel obstruction. Electronically Signed   By: Corlis Leak M.D.   On: 05/28/2022 14:42   ECHOCARDIOGRAM COMPLETE  Result Date: 05/27/2022    ECHOCARDIOGRAM REPORT   Patient Name:   Joshua [name] Date of Exam: 05/27/2022 Medical Rec #:  [id number]            Height:       73.0 in Accession #:    [id number]           Weight:       220.2 lb Date of Birth:  May 18, 1945           BSA:          2.241 m Patient Age:    76 years             BP:           110/68 mmHg Patient Gender: M                    HR:           82 bpm. Exam Location:  Inpatient Procedure: 2D Echo, Color Doppler, Cardiac Doppler and Intracardiac            Opacification Agent Indications:    Chest Pain  History:        Patient has prior history of Echocardiogram examinations, most                 recent 02/06/2021. Previous Myocardial Infarction and CAD, OSA  and Stroke, Arrythmias:RBBB; Risk Factors:Dyslipidemia  and                 Hypertension.  Sonographer:    Milbert Coulter Referring Phys: [id number] JENNIFER YATES  Sonographer Comments: Suboptimal apical window. Image acquisition challenging due to patient body habitus. IMPRESSIONS  1. Left ventricular ejection fraction, by estimation, is 60 to 65%. The left ventricle has normal function. The left ventricle has no regional wall motion abnormalities. There is mild concentric left ventricular hypertrophy. Left ventricular diastolic parameters are consistent with Grade I diastolic dysfunction (impaired relaxation).  2. Right ventricular systolic function is normal. The right ventricular size is not well visualized.  3. The mitral valve was not well visualized. No evidence of mitral valve regurgitation.  4. The aortic valve was not well visualized. Aortic valve regurgitation is not visualized. No aortic stenosis is present. Comparison(s): No significant change from prior study. This study is technically more challenging from prior. FINDINGS  Left Ventricle: Left ventricular ejection fraction, by estimation, is 60 to 65%. The left ventricle has normal function. The left ventricle has no regional wall motion abnormalities. Definity contrast agent was given IV to delineate the left ventricular  endocardial borders. The left ventricular internal cavity size was normal in size. There is mild concentric left ventricular hypertrophy. Left ventricular diastolic parameters are consistent with Grade I diastolic dysfunction (impaired relaxation). Right Ventricle: The right ventricular size is not well visualized. Right vetricular wall thickness was not well visualized. Right ventricular systolic function is normal. Left Atrium: Left atrial size was not well visualized. Right Atrium: Right atrial size was not well visualized. Pericardium: There is no evidence of pericardial effusion. Mitral Valve: The mitral valve was not well visualized. No evidence of mitral valve regurgitation. Tricuspid  Valve: The tricuspid valve is not well visualized. Tricuspid valve regurgitation is not demonstrated. Aortic Valve: The aortic valve was not well visualized. Aortic valve regurgitation is not visualized. No aortic stenosis is present. Aortic valve mean gradient measures 4.0 mmHg. Aortic valve peak gradient measures 7.4 mmHg. Aortic valve area, by VTI measures 2.65 cm. Pulmonic Valve: The pulmonic valve was not well visualized. Pulmonic valve regurgitation is not visualized. Aorta: The aortic root is normal in size and structure and the ascending aorta was not well visualized. IAS/Shunts: The interatrial septum was not well visualized.  LEFT VENTRICLE PLAX 2D LVIDd:         4.50 cm   Diastology LVIDs:         3.30 cm   LV e' medial:    5.98 cm/s LV PW:         1.30 cm   LV E/e' medial:  7.1 LV IVS:        1.20 cm   LV e' lateral:   6.64 cm/s LVOT diam:     2.10 cm   LV E/e' lateral: 6.4 LV SV:         62 LV SV Index:   28 LVOT Area:     3.46 cm  RIGHT VENTRICLE RV S prime:     13.40 cm/s TAPSE (M-mode): 2.0 cm LEFT ATRIUM             Index LA diam:        3.00 cm 1.34 cm/m LA Vol (A2C):   49.7 ml 22.17 ml/m LA Vol (A4C):   43.6 ml 19.45 ml/m LA Biplane Vol: 50.3 ml 22.44 ml/m  AORTIC VALVE AV Area (Vmax):    2.65 cm AV  Area (Vmean):   2.52 cm AV Area (VTI):     2.65 cm AV Vmax:           136.00 cm/s AV Vmean:          92.400 cm/s AV VTI:            0.233 m AV Peak Grad:      7.4 mmHg AV Mean Grad:      4.0 mmHg LVOT Vmax:         104.00 cm/s LVOT Vmean:        67.100 cm/s LVOT VTI:          0.178 m LVOT/AV VTI ratio: 0.76  AORTA Ao Root diam: 3.00 cm MITRAL VALVE MV Area (PHT): 4.63 cm    SHUNTS MV Decel Time: 164 msec    Systemic VTI:  0.18 m MV E velocity: 42.40 cm/s  Systemic Diam: 2.10 cm MV A velocity: 83.80 cm/s MV E/A ratio:  0.51 Riley Lam MD Electronically signed by Riley Lam MD Signature Date/Time: 05/27/2022/4:23:01 PM    Final      LOS: 0 days   Lanae Boast, MD [facility]  05/29/2022, 10:28 AM

## 2022-05-30 DIAGNOSIS — K56609 Unspecified intestinal obstruction, unspecified as to partial versus complete obstruction: Secondary | ICD-10-CM | POA: Diagnosis not present

## 2022-05-30 LAB — COMPREHENSIVE METABOLIC PANEL
ALT: 10 U/L (ref 0–44)
AST: 25 U/L (ref 15–41)
Albumin: 2.9 g/dL — ABNORMAL LOW (ref 3.5–5.0)
Alkaline Phosphatase: 43 U/L (ref 38–126)
Anion gap: 10 (ref 5–15)
BUN: 10 mg/dL (ref 8–23)
CO2: 22 mmol/L (ref 22–32)
Calcium: 8.1 mg/dL — ABNORMAL LOW (ref 8.9–10.3)
Chloride: 104 mmol/L (ref 98–111)
Creatinine, Ser: 0.82 mg/dL (ref 0.61–1.24)
GFR, Estimated: >60 mL/min (ref >60)
Glucose, Bld: 84 mg/dL (ref 70–99)
Potassium: 3.3 mmol/L — ABNORMAL LOW (ref 3.5–5.1)
Sodium: 136 mmol/L (ref 135–145)
Total Bilirubin: 1.4 mg/dL — ABNORMAL HIGH (ref 0.3–1.2)
Total Protein: 5.3 g/dL — ABNORMAL LOW (ref 6.5–8.1)

## 2022-05-30 LAB — CBC
HCT: 38 % — ABNORMAL LOW (ref 39.0–52.0)
Hemoglobin: 12.8 g/dL — ABNORMAL LOW (ref 13.0–17.0)
MCH: 32.1 pg (ref 26.0–34.0)
MCHC: 33.7 g/dL (ref 30.0–36.0)
MCV: 95.2 fL (ref 80.0–100.0)
Platelets: 186 10*3/uL (ref 150–400)
RBC: 3.99 MIL/uL — ABNORMAL LOW (ref 4.22–5.81)
RDW: 13.5 % (ref 11.5–15.5)
WBC: 6.4 10*3/uL (ref 4.0–10.5)
nRBC: 0 % (ref 0.0–0.2)

## 2022-05-30 MED ORDER — POTASSIUM CHLORIDE CRYS ER 20 MEQ PO TBCR
20.0000 meq | EXTENDED_RELEASE_TABLET | Freq: Every day | ORAL | Status: DC
  Administered 2022-05-30 – 2022-05-31 (×2): 20 meq via ORAL
  Filled 2022-05-30 (×2): qty 1

## 2022-05-30 MED ORDER — POTASSIUM CHLORIDE CRYS ER 20 MEQ PO TBCR
40.0000 meq | EXTENDED_RELEASE_TABLET | Freq: Once | ORAL | Status: AC
  Administered 2022-05-30: 40 meq via ORAL
  Filled 2022-05-30: qty 2

## 2022-05-30 MED ORDER — CLOPIDOGREL BISULFATE 75 MG PO TABS
75.0000 mg | ORAL_TABLET | Freq: Every day | ORAL | Status: DC
  Administered 2022-05-30 – 2022-05-31 (×2): 75 mg via ORAL
  Filled 2022-05-30 (×2): qty 1

## 2022-05-30 NOTE — Progress Notes (Signed)
PROGRESS NOTE Joshua [name]  ZOX:[id number] DOB: 1945/12/14 DOA: 05/26/2022 PCP: [name], MD  Brief Narrative/Hospital Course: 77 y.o.m w/ Parkinson's, prostate CA, HLD, HTN, preDM, and TIA, NSTEMI in January 2023  w/ Cath showing small vessel diagonal/OM and distal LAD dx treated medically-TTE 02/06/21 showed normalization of EF, history of PVC on mexiletine followed by Dr. Elberta Fortis presentied with chest pain/epigastric pain and shortness of breath since 3 AM after doing yard work earlier in the day. He was recently placed on new medication for Parkinson's about a week ago (ropinirole).  He sees [facility] and had a recent colonoscopy and endoscopy (4/10) EGD with HH, fundic gland polyps, and mild esophageal dilation, pathology was unremarkable. In the ED -vitals with mild tachycardia initial BP 228/212> subsequent blood pressure normal in 90s to 100.labs stable CBC BMP troponin negative x 2 BNP 33. Seen by cardiology admitted for further management Further labs with LDL 23, HDL 46 HbA1c 5.3 TSH 0.8, mild hypokalemia and hyponatremia He was admitted for further workup.  Underwent echocardiogram that showed normal EF/wall motion and cardiology did nuclear perfusion study 5/16 to evaluate further. Cardiology considering empiric vasodilator therapy for possible coronary vasospasm but the risk of worsening orthostatic hypotension appears to outweigh the benefits.   Subjective: Seen this am Had semi solid BM no nausea or vomiting Overnight afebrile, BP stable  No bleeding from nose.  Assessment and Plan:  SBO w/ transition and central abdominal: Patient was having diarrhea for few days and was having epigastric pain with history of GERD, workup with normal lipase LFTs, x-ray and CT scan confirmed SBO.  CCS following and managed with NG tube decompression> off NG tube now > started full liquid diet and advance to soft diet tomorrow.  Ambulate, d/c ivf  Chest pain: Symptoms on  admission concerning for acute coronary syndrome with history of ST elevation MI in 2023. Echo normal and underwent a nuclear stress test > which is unremarkable, no further intervention as per cardiology.  STEMI in January 2023  w/ Cath showing small vessel diagonal/OM and distal LAD dx treated medically-TTE 02/06/21 showed normalization of EF, patient currently on aspirin 81, Plavix 75> resume now- no more bleeding, cont  Crestor.  history of PVC on mexiletine followed by Dr. Elberta Fortis -okay to hold while n.p.o. per cardiology  Hypokalemia will replace  Parkinson's disease:cont  Sinemet Celexa, holding ropinirole due to concern for diarrhea.> Follow-up with PCP/neurology  BPH/Prostate CA history: Continue finasteride tamsulosin follow-up with urology  HLD: Continue Crestor LDL stable at 23.   HTN: BP remains slightly on higher side  preDM: HbA1c 5.3.  DVT prophylaxis:  Code Status:   Code Status: Full Code Family Communication: plan of care discussed with patient at bedside. Patient status is:  admitted as observation > changed to inpatient due to SBO  Level of care: Telemetry Cardiac  Dispo: The patient is from: HOME W/ WIFE             Anticipated disposition: once SBO resolves 24 hrs Objective: Vitals last 24 hrs: Vitals:   05/29/22 2030 05/30/22 0110 05/30/22 0530 05/30/22 0739  BP: (!) 162/91 (!) 173/104 (!) 157/93 (!) 165/90  Pulse: 86 98 95 97  Resp: 18 18 18 18   Temp: 98.4 F (36.9 C) 97.9 F (36.6 C) 99.1 F (37.3 C) 97.9 F (36.6 C)  TempSrc: Oral Oral Oral Oral  SpO2: 92% 96% 94% 95%  Weight:   101.7 kg   Height:  Weight change: -0.358 kg  Physical Examination: General exam: AAox3, weak,older appearing HEENT:Oral mucosa moist, Ear/Nose WNL grossly, dentition normal. Respiratory system: bilaterally clear BS, no use of accessory muscle Cardiovascular system: S1 & S2 +, regular rate,. Gastrointestinal system: Abdomen soft, full, NT,BS+ Nervous  System:Alert, awake, moving extremities and grossly nonfocal Extremities: LE ankle edema neg, lower extremities warm Skin:No rashes,no icterus. ZOX:WRUEAV muscle bulk,tone, power   Medications reviewed:  Scheduled Meds:  aspirin EC  81 mg Oral Daily   azelastine  1 spray Each Nare QHS   And   fluticasone  1 spray Each Nare QHS   carbidopa-levodopa  1 tablet Oral QHS   carbidopa-levodopa  2 tablet Oral 5 times per day   docusate sodium  100 mg Oral BID   escitalopram  20 mg Oral Daily   finasteride  5 mg Oral QPM   loratadine  10 mg Oral QPM   mexiletine  150 mg Oral BID   montelukast  10 mg Oral QHS   pantoprazole (PROTONIX) IV  40 mg Intravenous Q12H   regadenoson  0.4 mg Intravenous Once   rosuvastatin  20 mg Oral QHS   saccharomyces boulardii  250 mg Oral BID   sodium chloride flush  3 mL Intravenous Q12H   tamsulosin  0.4 mg Oral QPC supper   zolpidem  5 mg Oral QHS  Continuous Infusions:  sodium chloride 100 mL/hr at 05/29/22 1009    Diet Order             Diet full liquid Room service appropriate? Yes; Fluid consistency: Thin  Diet effective now                  Intake/Output Summary (Last 24 hours) at 05/30/2022 0835 Last data filed at 05/29/2022 2028 Gross per 24 hour  Intake 1460.87 ml  Output 1000 ml  Net 460.87 ml    Net IO Since Admission: 4,832.43 mL [05/30/22 0835]  Wt Readings from Last 3 Encounters:  05/30/22 101.7 kg  05/21/22 102.9 kg  04/08/22 101.3 kg     Unresulted Labs (From admission, onward)     Start     Ordered   05/29/22 0500  CBC  Daily,   R     Question:  Specimen collection method  Answer:  Lab=Lab collect   05/28/22 1420   05/29/22 0500  Comprehensive metabolic panel  Daily,   R     Question:  Specimen collection method  Answer:  Lab=Lab collect   05/28/22 1420          Data Reviewed: I have personally reviewed following labs and imaging studies CBC: Recent Labs  Lab 05/26/22 0933 05/27/22 0102 05/29/22 0043  05/30/22 0043  WBC 5.1 5.5 6.3 6.4  HGB 15.7 13.2 13.6 12.8*  HCT 46.4 39.7 40.1 38.0*  MCV 96.9 97.1 93.5 95.2  PLT 197 174 188 186    Basic Metabolic Panel: Recent Labs  Lab 05/26/22 0933 05/27/22 0102 05/29/22 0043 05/30/22 0043  NA 137 132* 131* 136  K 4.0 3.3* 4.2 3.3*  CL 104 102 98 104  CO2 21* 22 25 22   GLUCOSE 127* 106* 106* 84  BUN 20 22 10 10   CREATININE 0.78 0.96 0.74 0.82  CALCIUM 9.1 8.5* 8.6* 8.1*    Recent Labs  Lab 05/29/22 0043 05/30/22 0043  AST 13* 25  ALT 7 10  ALKPHOS 44 43  BILITOT 1.3* 1.4*  PROT 5.8* 5.3*  ALBUMIN 3.1* 2.9*  Recent Labs  Lab 05/26/22 1831  LIPASE 23    Recent Results (from the past 240 hour(s))  Resp panel by RT-PCR (RSV, Flu A&B, Covid) Anterior Nasal Swab     Status: None   Collection Time: 05/26/22  3:35 PM   Specimen: Anterior Nasal Swab  Result Value Ref Range Status   SARS Coronavirus 2 by RT PCR NEGATIVE NEGATIVE Final    Comment: (NOTE) SARS-CoV-2 target nucleic acids are NOT DETECTED.  The SARS-CoV-2 RNA is generally detectable in upper respiratory specimens during the acute phase of infection. The lowest concentration of SARS-CoV-2 viral copies this assay can detect is 138 copies/mL. A negative result does not preclude SARS-Cov-2 infection and should not be used as the sole basis for treatment or other patient management decisions. A negative result may occur with  improper specimen collection/handling, submission of specimen other than nasopharyngeal swab, presence of viral mutation(s) within the areas targeted by this assay, and inadequate number of viral copies(<138 copies/mL). A negative result must be combined with clinical observations, patient history, and epidemiological information. The expected result is Negative.  Fact Sheet for Patients:  BloggerCourse.com  Fact Sheet for Healthcare Providers:  SeriousBroker.it  This test is no t  yet approved or cleared by the [place] FDA and  has been authorized for detection and/or diagnosis of SARS-CoV-2 by FDA under an Emergency Use Authorization (EUA). This EUA will remain  in effect (meaning this test can be used) for the duration of the COVID-19 declaration under Section 564(b)(1) of the Act, 21 U.S.C.section 360bbb-3(b)(1), unless the authorization is terminated  or revoked sooner.       Influenza A by PCR NEGATIVE NEGATIVE Final   Influenza B by PCR NEGATIVE NEGATIVE Final    Comment: (NOTE) The Xpert Xpress SARS-CoV-2/FLU/RSV plus assay is intended as an aid in the diagnosis of influenza from Nasopharyngeal swab specimens and should not be used as a sole basis for treatment. Nasal washings and aspirates are unacceptable for Xpert Xpress SARS-CoV-2/FLU/RSV testing.  Fact Sheet for Patients: BloggerCourse.com  Fact Sheet for Healthcare Providers: SeriousBroker.it  This test is not yet approved or cleared by the [place] FDA and has been authorized for detection and/or diagnosis of SARS-CoV-2 by FDA under an Emergency Use Authorization (EUA). This EUA will remain in effect (meaning this test can be used) for the duration of the COVID-19 declaration under Section 564(b)(1) of the Act, 21 U.S.C. section 360bbb-3(b)(1), unless the authorization is terminated or revoked.     Resp Syncytial Virus by PCR NEGATIVE NEGATIVE Final    Comment: (NOTE) Fact Sheet for Patients: BloggerCourse.com  Fact Sheet for Healthcare Providers: SeriousBroker.it  This test is not yet approved or cleared by the [place] FDA and has been authorized for detection and/or diagnosis of SARS-CoV-2 by FDA under an Emergency Use Authorization (EUA). This EUA will remain in effect (meaning this test can be used) for the duration of the COVID-19 declaration under Section 564(b)(1)  of the Act, 21 U.S.C. section 360bbb-3(b)(1), unless the authorization is terminated or revoked.  Performed at [facility], [address], [place], [place] [address]     Antimicrobials: Anti-infectives (From admission, onward)    None      Culture/Microbiology No results found for: "SDES", "SPECREQUEST", "CULT", "REPTSTATUS"  Radiology Studies: DG Abd Portable 1V  Result Date: 05/29/2022 CLINICAL DATA:  Enteric catheter placement EXAM: PORTABLE ABDOMEN - 1 VIEW COMPARISON:  05/29/2022 FINDINGS: Frontal view of the lower chest and upper  abdomen demonstrates enteric catheter passing below diaphragm tip and side port projecting over the gastric fundus. Oral contrast is seen throughout the colon. No evidence of bowel obstruction. IMPRESSION: 1. Enteric catheter tip projecting over the gastric fundus. Electronically Signed   By: Sharlet Salina M.D.   On: 05/29/2022 23:51   DG Abd Portable 1V-Small Bowel Obstruction Protocol-initial, 8 hr delay  Result Date: 05/29/2022 CLINICAL DATA:  Nasogastric tube placement EXAM: PORTABLE ABDOMEN - 1 VIEW COMPARISON:  6:16 p.m. FINDINGS: Nasogastric tube tip is seen just beyond the gastroesophageal junction with the proximal side hole position at the junction itself. Administered oral contrast is now seen throughout a nondistended colon. No free intraperitoneal gas. Lumbar fusion with instrumentation and right inguinal hernia repair with mesh has been performed. IMPRESSION: 1. Nasogastric tube tip just beyond the gastroesophageal junction with the proximal side hole position at the junction itself. Advancement of the catheter by 10-15 cm is recommended for optimal positioning. Electronically Signed   By: Helyn Numbers M.D.   On: 05/29/2022 22:59   DG Abd Portable 1V  Result Date: 05/29/2022 CLINICAL DATA:  NG tube placement EXAM: PORTABLE ABDOMEN - 1 VIEW COMPARISON:  05/29/2022 FINDINGS: Esophageal tube tip and side port slightly  beyond GE junction. This appears retracted compared to prior. Enteral contrast in the stomach and colon. Contrast filled dilated loop of small bowel in the left upper quadrant measuring 5 cm. IMPRESSION: Esophageal tube tip and side port slightly beyond GE junction but appears retracted compared to prior. Contrast opacified dilated small bowel in the left upper quadrant Electronically Signed   By: Jasmine Pang M.D.   On: 05/29/2022 18:33   DG Abd 1 View  Result Date: 05/29/2022 CLINICAL DATA:  [id number] Encounter for imaging study to confirm nasogastric (NG) tube placement [id number] EXAM: ABDOMEN - 1 VIEW COMPARISON:  Radiograph 05/29/2022 FINDINGS: Nasogastric tube tip and side port overlie the stomach, advanced from prior. Persistent mildly dilated small bowel loops, not significantly changed. IMPRESSION: Nasogastric tube tip and side port overlie the stomach, advanced from prior. Unchanged multiple dilated loops of small bowel compatible with obstruction. Electronically Signed   By: Caprice Renshaw M.D.   On: 05/29/2022 10:56   DG Abd Portable 1V-Small Bowel Protocol-Position Verification  Result Date: 05/29/2022 CLINICAL DATA:  [id number] Encounter for imaging study to confirm nasogastric (NG) tube placement [id number] EXAM: PORTABLE ABDOMEN - 1 VIEW COMPARISON:  Radiograph 05/29/2022 FINDINGS: Nasogastric tube side port overlies the proximal stomach, similar to prior. There is slight increased slack in the distal esophagus. Persistent mildly dilated loops of small bowel in the central abdomen. IMPRESSION: Nasogastric tube side port overlies the proximal stomach, similar to prior. Persistent multiple dilated loops of small bowel compatible with obstruction. Electronically Signed   By: Caprice Renshaw M.D.   On: 05/29/2022 09:57   DG Abd Portable 1V  Result Date: 05/29/2022 CLINICAL DATA:  [id number] Encounter for nasogastric (NG) tube placement [id number] EXAM: PORTABLE ABDOMEN - 1 VIEW COMPARISON:  CT 05/28/2022 FINDINGS:  Nasogastric tube tip and side port overlie the proximal stomach, side-port near the region of the GE junction. Persistent mildly dilated loops of small bowel in the central abdomen. IMPRESSION: Nasogastric tube tip and side port overlie the proximal stomach, side port near the region of the GE junction. Could advance by 5.0 cm to ensure adequate placement. Electronically Signed   By: Caprice Renshaw M.D.   On: 05/29/2022 09:19   CT ABDOMEN PELVIS W CONTRAST  Result Date: 05/28/2022  CLINICAL DATA:  Acute abdominal pain EXAM: CT ABDOMEN AND PELVIS WITH CONTRAST TECHNIQUE: Multidetector CT imaging of the abdomen and pelvis was performed using the standard protocol following bolus administration of intravenous contrast. RADIATION DOSE REDUCTION: This exam was performed according to the departmental dose-optimization program which includes automated exposure control, adjustment of the mA and/or kV according to patient size and/or use of iterative reconstruction technique. CONTRAST:  75mL OMNIPAQUE IOHEXOL 350 MG/ML SOLN COMPARISON:  CT chest abdomen and pelvis 02/05/2021 FINDINGS: Lower chest: There is atelectasis in the lung bases. Hepatobiliary: No focal liver abnormality is seen. Status post cholecystectomy. No biliary dilatation. Pancreas: Unremarkable. No pancreatic ductal dilatation or surrounding inflammatory changes. Spleen: Normal in size without focal abnormality. Adrenals/Urinary Tract: Adrenal glands are unremarkable. Kidneys are normal, without renal calculi, focal lesion, or hydronephrosis. Bladder is unremarkable. Stomach/Bowel: There are dilated mid and proximal small bowel loops with air-fluid levels measuring up to 4.6 Cm. There is some associated mesenteric edema. Stomach is distended with fluid and there is also fluid in the distal esophagus which can be seen with gastroesophageal reflux. A transition point is seen in the central abdomen image 3/68. Distal small bowel and colon are nondilated. There  is colonic diverticulosis without evidence for acute diverticulitis. The appendix is within normal limits. There is no pneumatosis or free air. Vascular/Lymphatic: Aortic atherosclerosis. No enlarged abdominal or pelvic lymph nodes. Reproductive: Prostate is unremarkable. There surgical clips near the prostate gland. Other: There is no ascites or free air. There has been prior right inguinal hernia repair. There are very small fat containing bilateral inguinal and umbilical hernias. Musculoskeletal: L3-S1 posterior fusion hardware is present. IMPRESSION: 1. Findings compatible with small bowel obstruction with transition point in the central abdomen. There is associated mesenteric edema. No pneumatosis or free air. 2. Colonic diverticulosis. Aortic Atherosclerosis (ICD10-I70.0). Electronically Signed   By: Darliss Cheney M.D.   On: 05/28/2022 18:54   NM Myocar Multi W/Spect Izetta Dakin Motion / EF  Result Date: 05/28/2022 CLINICAL DATA:  Chest pain.  Intermediate CAD risk. EXAM: MYOCARDIAL IMAGING WITH SPECT (REST AND PHARMACOLOGIC-STRESS) GATED LEFT VENTRICULAR WALL MOTION STUDY LEFT VENTRICULAR EJECTION FRACTION TECHNIQUE: Standard myocardial SPECT imaging was performed after resting intravenous injection of 9.9 mCi Tc-47m tetrofosmin. Subsequently, intravenous infusion of Lexiscan was performed under the supervision of the Cardiology staff. At peak effect of the drug, 31.4 mCi Tc-24m tetrofosmin was injected intravenously and standard myocardial SPECT imaging was performed. Quantitative gated imaging was also performed to evaluate left ventricular wall motion, and estimate left ventricular ejection fraction. COMPARISON:  None Available. FINDINGS: Perfusion: A small fixed defect is seen in the distal inferolateral wall of the left ventricle on both stress and resting images, likely due to diaphragmatic attenuation artifact. No reversible myocardial perfusion defects are seen to suggest reversible myocardial  ischemia. Wall Motion: Normal left ventricular wall motion. No left ventricular dilation. Left Ventricular Ejection Fraction: 48 % End diastolic volume 92 ml End systolic volume 47 ml IMPRESSION: 1. No reversible ischemia. Probable diaphragmatic attenuation artifact. 2. Normal left ventricular wall motion. 3. Left ventricular ejection fraction 48% 4. Non invasive risk stratification*: Low *2012 Appropriate Use Criteria for Coronary Revascularization Focused Update: J Am Coll Cardiol. 2012;59(9):857-881. http://content.dementiazones.com.aspx?articleid=[email or web address] Electronically Signed   By: Danae Orleans M.D.   On: 05/28/2022 15:11   DG Abd Portable 1V  Result Date: 05/28/2022 CLINICAL DATA:  [id number] Pain in the abdomen [id number] EXAM: PORTABLE ABDOMEN - 1 VIEW COMPARISON:  CT 02/05/2021 FINDINGS: Stomach is nondistended.  Multiple gas dilated small bowel loops in the mid abdomen. Colon is nondilated. Cholecystectomy clips. Lumbosacral fixation hardware with adjacent level degenerative disc disease L2-3. IMPRESSION: Dilated small bowel loops suggesting small bowel obstruction. Electronically Signed   By: Corlis Leak M.D.   On: 05/28/2022 14:42     LOS: 1 day   Lanae Boast, MD [facility]  05/30/2022, 8:35 AM

## 2022-05-30 NOTE — Progress Notes (Signed)
Subjective/Chief Complaint: Having some flatus, loose stools   Objective: Vital signs in last 24 hours: Temp:  [97.9 F (36.6 C)-99.1 F (37.3 C)] 97.9 F (36.6 C) (05/18 0739) Pulse Rate:  [86-98] 97 (05/18 0739) Resp:  [18] 18 (05/18 0739) BP: (157-173)/(86-104) 165/90 (05/18 0739) SpO2:  [92 %-96 %] 95 % (05/18 0739) Weight:  [101.7 kg] 101.7 kg (05/18 0530) Last BM Date : 05/27/22  Intake/Output from previous day: 05/17 0701 - 05/18 0700 In: 1460.9 [I.V.:1160.9; NG/GT:300] Out: 1000 [Urine:500; Emesis/NG output:500] Intake/Output this shift: No intake/output data recorded.  Ab soft nontender  Lab Results:  Recent Labs    05/29/22 0043 05/30/22 0043  WBC 6.3 6.4  HGB 13.6 12.8*  HCT 40.1 38.0*  PLT 188 186   BMET Recent Labs    05/29/22 0043 05/30/22 0043  NA 131* 136  K 4.2 3.3*  CL 98 104  CO2 25 22  GLUCOSE 106* 84  BUN 10 10  CREATININE 0.74 0.82  CALCIUM 8.6* 8.1*   PT/INR No results for input(s): "LABPROT", "INR" in the last 72 hours. ABG No results for input(s): "PHART", "HCO3" in the last 72 hours.  Invalid input(s): "PCO2", "PO2"  Studies/Results: DG Abd Portable 1V  Result Date: 05/29/2022 CLINICAL DATA:  Enteric catheter placement EXAM: PORTABLE ABDOMEN - 1 VIEW COMPARISON:  05/29/2022 FINDINGS: Frontal view of the lower chest and upper abdomen demonstrates enteric catheter passing below diaphragm tip and side port projecting over the gastric fundus. Oral contrast is seen throughout the colon. No evidence of bowel obstruction. IMPRESSION: 1. Enteric catheter tip projecting over the gastric fundus. Electronically Signed   By: Sharlet Salina M.D.   On: 05/29/2022 23:51   DG Abd Portable 1V-Small Bowel Obstruction Protocol-initial, 8 hr delay  Result Date: 05/29/2022 CLINICAL DATA:  Nasogastric tube placement EXAM: PORTABLE ABDOMEN - 1 VIEW COMPARISON:  6:16 p.m. FINDINGS: Nasogastric tube tip is seen just beyond the gastroesophageal  junction with the proximal side hole position at the junction itself. Administered oral contrast is now seen throughout a nondistended colon. No free intraperitoneal gas. Lumbar fusion with instrumentation and right inguinal hernia repair with mesh has been performed. IMPRESSION: 1. Nasogastric tube tip just beyond the gastroesophageal junction with the proximal side hole position at the junction itself. Advancement of the catheter by 10-15 cm is recommended for optimal positioning. Electronically Signed   By: Helyn Numbers M.D.   On: 05/29/2022 22:59   DG Abd Portable 1V  Result Date: 05/29/2022 CLINICAL DATA:  NG tube placement EXAM: PORTABLE ABDOMEN - 1 VIEW COMPARISON:  05/29/2022 FINDINGS: Esophageal tube tip and side port slightly beyond GE junction. This appears retracted compared to prior. Enteral contrast in the stomach and colon. Contrast filled dilated loop of small bowel in the left upper quadrant measuring 5 cm. IMPRESSION: Esophageal tube tip and side port slightly beyond GE junction but appears retracted compared to prior. Contrast opacified dilated small bowel in the left upper quadrant Electronically Signed   By: Jasmine Pang M.D.   On: 05/29/2022 18:33   DG Abd 1 View  Result Date: 05/29/2022 CLINICAL DATA:  [id number] Encounter for imaging study to confirm nasogastric (NG) tube placement [id number] EXAM: ABDOMEN - 1 VIEW COMPARISON:  Radiograph 05/29/2022 FINDINGS: Nasogastric tube tip and side port overlie the stomach, advanced from prior. Persistent mildly dilated small bowel loops, not significantly changed. IMPRESSION: Nasogastric tube tip and side port overlie the stomach, advanced from prior. Unchanged multiple dilated loops of small  bowel compatible with obstruction. Electronically Signed   By: Caprice Renshaw M.D.   On: 05/29/2022 10:56   DG Abd Portable 1V-Small Bowel Protocol-Position Verification  Result Date: 05/29/2022 CLINICAL DATA:  [id number] Encounter for imaging study to confirm  nasogastric (NG) tube placement [id number] EXAM: PORTABLE ABDOMEN - 1 VIEW COMPARISON:  Radiograph 05/29/2022 FINDINGS: Nasogastric tube side port overlies the proximal stomach, similar to prior. There is slight increased slack in the distal esophagus. Persistent mildly dilated loops of small bowel in the central abdomen. IMPRESSION: Nasogastric tube side port overlies the proximal stomach, similar to prior. Persistent multiple dilated loops of small bowel compatible with obstruction. Electronically Signed   By: Caprice Renshaw M.D.   On: 05/29/2022 09:57   DG Abd Portable 1V  Result Date: 05/29/2022 CLINICAL DATA:  [id number] Encounter for nasogastric (NG) tube placement [id number] EXAM: PORTABLE ABDOMEN - 1 VIEW COMPARISON:  CT 05/28/2022 FINDINGS: Nasogastric tube tip and side port overlie the proximal stomach, side-port near the region of the GE junction. Persistent mildly dilated loops of small bowel in the central abdomen. IMPRESSION: Nasogastric tube tip and side port overlie the proximal stomach, side port near the region of the GE junction. Could advance by 5.0 cm to ensure adequate placement. Electronically Signed   By: Caprice Renshaw M.D.   On: 05/29/2022 09:19   CT ABDOMEN PELVIS W CONTRAST  Result Date: 05/28/2022 CLINICAL DATA:  Acute abdominal pain EXAM: CT ABDOMEN AND PELVIS WITH CONTRAST TECHNIQUE: Multidetector CT imaging of the abdomen and pelvis was performed using the standard protocol following bolus administration of intravenous contrast. RADIATION DOSE REDUCTION: This exam was performed according to the departmental dose-optimization program which includes automated exposure control, adjustment of the mA and/or kV according to patient size and/or use of iterative reconstruction technique. CONTRAST:  75mL OMNIPAQUE IOHEXOL 350 MG/ML SOLN COMPARISON:  CT chest abdomen and pelvis 02/05/2021 FINDINGS: Lower chest: There is atelectasis in the lung bases. Hepatobiliary: No focal liver abnormality is seen.  Status post cholecystectomy. No biliary dilatation. Pancreas: Unremarkable. No pancreatic ductal dilatation or surrounding inflammatory changes. Spleen: Normal in size without focal abnormality. Adrenals/Urinary Tract: Adrenal glands are unremarkable. Kidneys are normal, without renal calculi, focal lesion, or hydronephrosis. Bladder is unremarkable. Stomach/Bowel: There are dilated mid and proximal small bowel loops with air-fluid levels measuring up to 4.6 Cm. There is some associated mesenteric edema. Stomach is distended with fluid and there is also fluid in the distal esophagus which can be seen with gastroesophageal reflux. A transition point is seen in the central abdomen image 3/68. Distal small bowel and colon are nondilated. There is colonic diverticulosis without evidence for acute diverticulitis. The appendix is within normal limits. There is no pneumatosis or free air. Vascular/Lymphatic: Aortic atherosclerosis. No enlarged abdominal or pelvic lymph nodes. Reproductive: Prostate is unremarkable. There surgical clips near the prostate gland. Other: There is no ascites or free air. There has been prior right inguinal hernia repair. There are very small fat containing bilateral inguinal and umbilical hernias. Musculoskeletal: L3-S1 posterior fusion hardware is present. IMPRESSION: 1. Findings compatible with small bowel obstruction with transition point in the central abdomen. There is associated mesenteric edema. No pneumatosis or free air. 2. Colonic diverticulosis. Aortic Atherosclerosis (ICD10-I70.0). Electronically Signed   By: Darliss Cheney M.D.   On: 05/28/2022 18:54   NM Myocar Multi W/Spect Izetta Dakin Motion / EF  Result Date: 05/28/2022 CLINICAL DATA:  Chest pain.  Intermediate CAD risk. EXAM: MYOCARDIAL IMAGING WITH SPECT (REST AND PHARMACOLOGIC-STRESS)  GATED LEFT VENTRICULAR WALL MOTION STUDY LEFT VENTRICULAR EJECTION FRACTION TECHNIQUE: Standard myocardial SPECT imaging was performed after  resting intravenous injection of 9.9 mCi Tc-18m tetrofosmin. Subsequently, intravenous infusion of Lexiscan was performed under the supervision of the Cardiology staff. At peak effect of the drug, 31.4 mCi Tc-41m tetrofosmin was injected intravenously and standard myocardial SPECT imaging was performed. Quantitative gated imaging was also performed to evaluate left ventricular wall motion, and estimate left ventricular ejection fraction. COMPARISON:  None Available. FINDINGS: Perfusion: A small fixed defect is seen in the distal inferolateral wall of the left ventricle on both stress and resting images, likely due to diaphragmatic attenuation artifact. No reversible myocardial perfusion defects are seen to suggest reversible myocardial ischemia. Wall Motion: Normal left ventricular wall motion. No left ventricular dilation. Left Ventricular Ejection Fraction: 48 % End diastolic volume 92 ml End systolic volume 47 ml IMPRESSION: 1. No reversible ischemia. Probable diaphragmatic attenuation artifact. 2. Normal left ventricular wall motion. 3. Left ventricular ejection fraction 48% 4. Non invasive risk stratification*: Low *2012 Appropriate Use Criteria for Coronary Revascularization Focused Update: J Am Coll Cardiol. 2012;59(9):857-881. http://content.[email or web address].aspx?articleid=[email or web address] Electronically Signed   By: Danae Orleans M.D.   On: 05/28/2022 15:11   DG Abd Portable 1V  Result Date: 05/28/2022 CLINICAL DATA:  [id number] Pain in the abdomen [id number] EXAM: PORTABLE ABDOMEN - 1 VIEW COMPARISON:  CT 02/05/2021 FINDINGS: Stomach is nondistended. Multiple gas dilated small bowel loops in the mid abdomen. Colon is nondilated. Cholecystectomy clips. Lumbosacral fixation hardware with adjacent level degenerative disc disease L2-3. IMPRESSION: Dilated small bowel loops suggesting small bowel obstruction. Electronically Signed   By: Corlis Leak M.D.   On: 05/28/2022 14:42    Anti-infectives: Anti-infectives (From  admission, onward)    None       Assessment/Plan: SBO - CT w/ SBO w/ transition in central abdomen. There is some associated mesenteric edema. Hx Cholecystectomy and RIH repair  - contrast in colon, having some bowel function -dc ng tube, fulls, adat to soft by tomorrow -if does well can go home tomorrow -would not give imodium- having some loose stool due to contrast   FEN - fulls, IVF per [facility] VTE - SCDs, Lovenox ID - None     Emelia Loron 05/30/2022

## 2022-05-31 DIAGNOSIS — K56609 Unspecified intestinal obstruction, unspecified as to partial versus complete obstruction: Secondary | ICD-10-CM | POA: Diagnosis not present

## 2022-05-31 LAB — COMPREHENSIVE METABOLIC PANEL
ALT: 7 U/L (ref 0–44)
AST: 23 U/L (ref 15–41)
Albumin: 3.1 g/dL — ABNORMAL LOW (ref 3.5–5.0)
Alkaline Phosphatase: 44 U/L (ref 38–126)
Anion gap: 8 (ref 5–15)
BUN: 7 mg/dL — ABNORMAL LOW (ref 8–23)
CO2: 25 mmol/L (ref 22–32)
Calcium: 8.8 mg/dL — ABNORMAL LOW (ref 8.9–10.3)
Chloride: 102 mmol/L (ref 98–111)
Creatinine, Ser: 0.74 mg/dL (ref 0.61–1.24)
GFR, Estimated: >60 mL/min (ref >60)
Glucose, Bld: 95 mg/dL (ref 70–99)
Potassium: 4.5 mmol/L (ref 3.5–5.1)
Sodium: 135 mmol/L (ref 135–145)
Total Bilirubin: 1.5 mg/dL — ABNORMAL HIGH (ref 0.3–1.2)
Total Protein: 5.5 g/dL — ABNORMAL LOW (ref 6.5–8.1)

## 2022-05-31 LAB — CBC
HCT: 37.5 % — ABNORMAL LOW (ref 39.0–52.0)
Hemoglobin: 12.7 g/dL — ABNORMAL LOW (ref 13.0–17.0)
MCH: 32.1 pg (ref 26.0–34.0)
MCHC: 33.9 g/dL (ref 30.0–36.0)
MCV: 94.7 fL (ref 80.0–100.0)
Platelets: 193 10*3/uL (ref 150–400)
RBC: 3.96 MIL/uL — ABNORMAL LOW (ref 4.22–5.81)
RDW: 13.2 % (ref 11.5–15.5)
WBC: 6.5 10*3/uL (ref 4.0–10.5)
nRBC: 0 % (ref 0.0–0.2)

## 2022-05-31 NOTE — Progress Notes (Signed)
Able to tolerate breakfast meal. Ate 100% of breakfast. Able to eat eggs, grits, and oatmeal with no discomfort.

## 2022-05-31 NOTE — Progress Notes (Signed)
Subjective/Chief Complaint: Doing fine, no pain, no n/v, having bowel function   Objective: Vital signs in last 24 hours: Temp:  [97.9 F (36.6 C)-98.7 F (37.1 C)] 98.5 F (36.9 C) (05/19 0806) Pulse Rate:  [67-96] 67 (05/19 0806) Resp:  [17-20] 18 (05/19 0806) BP: (125-151)/(85-95) 151/88 (05/19 0806) SpO2:  [93 %-95 %] 94 % (05/19 0806) Weight:  [100.1 kg] 100.1 kg (05/19 0440) Last BM Date : 05/30/22  Intake/Output from previous day: 05/18 0701 - 05/19 0700 In: 240 [P.O.:240] Out: 8 [Urine:4; Stool:4] Intake/Output this shift: No intake/output data recorded.  Ab soft nontender  Lab Results:  Recent Labs    05/30/22 0043 05/31/22 0056  WBC 6.4 6.5  HGB 12.8* 12.7*  HCT 38.0* 37.5*  PLT 186 193   BMET Recent Labs    05/30/22 0043 05/31/22 0056  NA 136 135  K 3.3* 4.5  CL 104 102  CO2 22 25  GLUCOSE 84 95  BUN 10 7*  CREATININE 0.82 0.74  CALCIUM 8.1* 8.8*   PT/INR No results for input(s): "LABPROT", "INR" in the last 72 hours. ABG No results for input(s): "PHART", "HCO3" in the last 72 hours.  Invalid input(s): "PCO2", "PO2"  Studies/Results: DG Abd Portable 1V  Result Date: 05/29/2022 CLINICAL DATA:  Enteric catheter placement EXAM: PORTABLE ABDOMEN - 1 VIEW COMPARISON:  05/29/2022 FINDINGS: Frontal view of the lower chest and upper abdomen demonstrates enteric catheter passing below diaphragm tip and side port projecting over the gastric fundus. Oral contrast is seen throughout the colon. No evidence of bowel obstruction. IMPRESSION: 1. Enteric catheter tip projecting over the gastric fundus. Electronically Signed   By: Sharlet Salina M.D.   On: 05/29/2022 23:51   DG Abd Portable 1V-Small Bowel Obstruction Protocol-initial, 8 hr delay  Result Date: 05/29/2022 CLINICAL DATA:  Nasogastric tube placement EXAM: PORTABLE ABDOMEN - 1 VIEW COMPARISON:  6:16 p.m. FINDINGS: Nasogastric tube tip is seen just beyond the gastroesophageal junction with the  proximal side hole position at the junction itself. Administered oral contrast is now seen throughout a nondistended colon. No free intraperitoneal gas. Lumbar fusion with instrumentation and right inguinal hernia repair with mesh has been performed. IMPRESSION: 1. Nasogastric tube tip just beyond the gastroesophageal junction with the proximal side hole position at the junction itself. Advancement of the catheter by 10-15 cm is recommended for optimal positioning. Electronically Signed   By: Helyn Numbers M.D.   On: 05/29/2022 22:59   DG Abd Portable 1V  Result Date: 05/29/2022 CLINICAL DATA:  NG tube placement EXAM: PORTABLE ABDOMEN - 1 VIEW COMPARISON:  05/29/2022 FINDINGS: Esophageal tube tip and side port slightly beyond GE junction. This appears retracted compared to prior. Enteral contrast in the stomach and colon. Contrast filled dilated loop of small bowel in the left upper quadrant measuring 5 cm. IMPRESSION: Esophageal tube tip and side port slightly beyond GE junction but appears retracted compared to prior. Contrast opacified dilated small bowel in the left upper quadrant Electronically Signed   By: Jasmine Pang M.D.   On: 05/29/2022 18:33   DG Abd 1 View  Result Date: 05/29/2022 CLINICAL DATA:  [id number] Encounter for imaging study to confirm nasogastric (NG) tube placement [id number] EXAM: ABDOMEN - 1 VIEW COMPARISON:  Radiograph 05/29/2022 FINDINGS: Nasogastric tube tip and side port overlie the stomach, advanced from prior. Persistent mildly dilated small bowel loops, not significantly changed. IMPRESSION: Nasogastric tube tip and side port overlie the stomach, advanced from prior. Unchanged multiple dilated loops  of small bowel compatible with obstruction. Electronically Signed   By: Caprice Renshaw M.D.   On: 05/29/2022 10:56   DG Abd Portable 1V-Small Bowel Protocol-Position Verification  Result Date: 05/29/2022 CLINICAL DATA:  [id number] Encounter for imaging study to confirm nasogastric (NG)  tube placement [id number] EXAM: PORTABLE ABDOMEN - 1 VIEW COMPARISON:  Radiograph 05/29/2022 FINDINGS: Nasogastric tube side port overlies the proximal stomach, similar to prior. There is slight increased slack in the distal esophagus. Persistent mildly dilated loops of small bowel in the central abdomen. IMPRESSION: Nasogastric tube side port overlies the proximal stomach, similar to prior. Persistent multiple dilated loops of small bowel compatible with obstruction. Electronically Signed   By: Caprice Renshaw M.D.   On: 05/29/2022 09:57   DG Abd Portable 1V  Result Date: 05/29/2022 CLINICAL DATA:  [id number] Encounter for nasogastric (NG) tube placement [id number] EXAM: PORTABLE ABDOMEN - 1 VIEW COMPARISON:  CT 05/28/2022 FINDINGS: Nasogastric tube tip and side port overlie the proximal stomach, side-port near the region of the GE junction. Persistent mildly dilated loops of small bowel in the central abdomen. IMPRESSION: Nasogastric tube tip and side port overlie the proximal stomach, side port near the region of the GE junction. Could advance by 5.0 cm to ensure adequate placement. Electronically Signed   By: Caprice Renshaw M.D.   On: 05/29/2022 09:19    Anti-infectives: Anti-infectives (From admission, onward)    None       Assessment/Plan: SBO - CT w/ SBO w/ transition in central abdomen. There is some associated mesenteric edema. Hx Cholecystectomy and RIH repair  - contrast in colon, having bowel function -resolved clinically and radiologically -dc home - no follow up needed -will sign off   FEN - soft VTE - SCDs, Lovenox ID - None    Emelia Loron 05/31/2022

## 2022-05-31 NOTE — Progress Notes (Signed)
Nursing Discharge Note  Patient ID: Joshua [name]  WUJ:[id number]  DOB: [birth year]    Admit Date: 05/26/2022  Discharge date: 05/31/2022   Joshua [name] is to be discharged home per MD order. AVS completed. Reviewed with patient and family at bedside by Discharge RN Juliette Alcide. Highlighted copy provided for patient to take home.  Patient/caregiver able to verbalize understanding of discharge instructions. PIV removed. Patient stable upon discharge.   Discharge Instructions     Discharge instructions   Complete by: As directed    Please call call MD or return to ER for similar or worsening recurring problem that brought you to hospital or if any fever,nausea/vomiting,abdominal pain, uncontrolled pain, chest pain,  shortness of breath or any other alarming symptoms.  Please follow-up your doctor as instructed in a week time and call the office for appointment.  Please avoid alcohol, smoking, or any other illicit substance and maintain healthy habits including taking your regular medications as prescribed.  You were cared for by a hospitalist during your hospital stay. If you have any questions about your discharge medications or the care you received while you were in the hospital after you are discharged, you can call the unit and ask to speak with the hospitalist on call if the hospitalist that took care of you is not available.  Once you are discharged, your primary care physician will handle any further medical issues. Please note that NO REFILLS for any discharge medications will be authorized once you are discharged, as it is imperative that you return to your primary care physician (or establish a relationship with a primary care physician if you do not have one) for your aftercare needs so that they can reassess your need for medications and monitor your lab values   Increase activity slowly   Complete by: As directed        Allergies as of 05/31/2022       Reactions   Floxin  I.v. In Dextrose 5% [ofloxacin] Other (See Comments)   Lowers BP Lowers BP   Terazosin Other (See Comments)   Lower bp        Medication List     TAKE these medications    acetaminophen 500 MG tablet Commonly known as: TYLENOL Take 1,000 mg by mouth in the morning and at bedtime.   AMBULATORY NON FORMULARY MEDICATION Dispense 1 electric scooter DX G20.A 1   Aspirin Low Dose 81 MG tablet Generic drug: aspirin EC TAKE 1 TABLET BY MOUTH EVERY DAY   Biotin 95621 MCG Tabs Take 1,000 mcg by mouth in the morning.   carbidopa-levodopa 50-200 MG tablet Commonly known as: SINEMET CR TAKE 1 TABLET BY MOUTH EVERYDAY AT BEDTIME   carbidopa-levodopa 25-100 MG tablet Commonly known as: SINEMET IR 2 TABLETS AT 7 AM, 2 TABLETS AT 10 AM,2 TABLETS AT 1PM,2 TABLETS AT 4 PM /2 TABLETS AT 7 pm   cholecalciferol 25 MCG (1000 UNIT) tablet Commonly known as: VITAMIN D3 Take 1,000 Units by mouth daily.   clopidogrel 75 MG tablet Commonly known as: Plavix Take 1 tablet (75 mg total) by mouth daily.   Dymista 137-50 MCG/ACT Susp Generic drug: Azelastine-Fluticasone Place 1 puff into both nostrils at bedtime.   escitalopram 10 MG tablet Commonly known as: LEXAPRO Take 20 mg by mouth daily.   finasteride 5 MG tablet Commonly known as: PROSCAR Take 5 mg by mouth every evening.   Fish Oil 1000 MG Caps Take 2,000 mg by mouth in the morning.  lansoprazole 30 MG capsule Commonly known as: PREVACID Take 30 mg by mouth 2 (two) times daily. Morning & mid-afternoon   levocetirizine 5 MG tablet Commonly known as: XYZAL Take 5 mg by mouth every evening.   metoprolol succinate 25 MG 24 hr tablet Commonly known as: TOPROL-XL TAKE 1/2 TABLET BY MOUTH DAILY   mexiletine 150 MG capsule Commonly known as: MEXITIL Take 1 capsule (150 mg total) by mouth 2 (two) times daily.   midodrine 5 MG tablet Commonly known as: PROAMATINE TAKE 1 TABLET BY MOUTH 2 TIMES DAILY AS NEEDED. TAKE IF  SYSTOLIC BLOOD PRESSURE GETS BELOW 100   montelukast 10 MG tablet Commonly known as: SINGULAIR Take 10 mg by mouth at bedtime.   nitroGLYCERIN 0.4 MG SL tablet Commonly known as: NITROSTAT Place 1 tablet (0.4 mg total) under the tongue every 5 (five) minutes as needed for chest pain (Do not give more than 3 SL tablets in 15 minutes.).   Proctosol HC 2.5 % rectal cream Generic drug: hydrocortisone APPLY INTO AND AROUND RECTUM 2 TIMES A DAY   rOPINIRole 0.25 MG tablet Commonly known as: REQUIP 1 po tid for 1 week, then 2 po tid, then 3 tabs po tid x 1 week   rOPINIRole 1 MG tablet Commonly known as: REQUIP Take 1 tablet (1 mg total) by mouth 3 (three) times daily.   rosuvastatin 20 MG tablet Commonly known as: CRESTOR Take 1 tablet (20 mg total) by mouth at bedtime.   tamsulosin 0.4 MG Caps capsule Commonly known as: FLOMAX Take 0.4 mg by mouth daily after supper.   traMADol 50 MG tablet Commonly known as: ULTRAM Take 50 mg by mouth every 6 (six) hours as needed. for pain   VITAMIN B 12 PO Take 1,000 mcg by mouth daily.   zolpidem 10 MG tablet Commonly known as: AMBIEN Take 10 mg by mouth at bedtime.        Discharge Instructions/ Education: Discharge instructions given to patient/family with verbalized understanding. Discharge education completed with patient/family including: follow up instructions, medication list, discharge activities, and limitations if indicated.  Patient instructed to return to Emergency Department, call 911, or call MD for any changes in condition.  Patient escorted via wheelchair to lobby and discharged home via private automobile.

## 2022-05-31 NOTE — Discharge Summary (Signed)
Physician Discharge Summary  Joshua [name] WUJ:[id number] DOB: 1945-11-03 DOA: 05/26/2022  PCP: [name], MD  Admit date: 05/26/2022 Discharge date: 05/31/2022 Recommendations for Outpatient Follow-up:  Follow up with PCP in 1 weeks-call for appointment Please obtain BMP/CBC in one week  Discharge Dispo: Home Discharge Condition: Stable Code Status:   Code Status: Full Code Diet recommendation:  Diet Order             DIET SOFT Room service appropriate? Yes; Fluid consistency: Thin  Diet effective now                    Brief/Interim Summary: 77 y.o.m w/ Parkinson's, prostate CA, HLD, HTN, preDM, and TIA, NSTEMI in January 2023  w/ Cath showing small vessel diagonal/OM and distal LAD dx treated medically-TTE 02/06/21 showed normalization of EF, history of PVC on mexiletine followed by Dr. Elberta Fortis presentied with chest pain/epigastric pain and shortness of breath since 3 AM after doing yard work earlier in the day. He was recently placed on new medication for Parkinson's about a week ago (ropinirole).  He sees [facility] and had a recent colonoscopy and endoscopy (4/10) EGD with HH, fundic gland polyps, and mild esophageal dilation, pathology was unremarkable. In the ED -vitals with mild tachycardia initial BP 228/212> subsequent blood pressure normal in 90s to 100.labs stable CBC BMP troponin negative x 2 BNP 33. Seen by cardiology admitted for further management Further labs with LDL 23, HDL 46 HbA1c 5.3 TSH 0.8, mild hypokalemia and hyponatremia He was admitted for further workup.  Underwent echocardiogram that showed normal EF/wall motion and cardiology did nuclear perfusion study 5/16 to evaluate further. Nuclear stress test was unremarkable He was noticed to have diarrhea and also abdominal pain and distention CT and x-ray showed SBO NG tube decompression started seen by general surgery kept n.p.o. and managed conservatively, clinically improved , NG tube  discontinued 5/18 and diet advanced on FLD> has been having some diarrhea, changed to soft diet 5/19> which he tolerated well at this time ambulating no nausea or vomiting.  He feels improved and okay for discharge surgery has signed off     Discharge Diagnoses:  Principal Problem:   SBO (small bowel obstruction) (HCC) Active Problems:   Hyperlipidemia with target LDL less than 70   HTN (hypertension)   History of myocardial infarction   Parkinson's disease   Chest pain   Epigastric abdominal pain  SBO w/ transition and central abdominal: Patient was having diarrhea for few days and was having epigastric pain with history of GERD, workup with normal lipase LFTs, x-ray and CT scan confirmed SBO.  [facility] following and managed with NG tube decompression> off NG tube  started full liquid diet and advance to soft diet  and toelrating well and okay for home  Chest pain: Symptoms on admission concerning for acute coronary syndrome with history of ST elevation MI in 2023. Echo normal and underwent a nuclear stress test > which is unremarkable, no further intervention as per cardiology.  STEMI in January 2023  w/ Cath showing small vessel diagonal/OM and distal LAD dx treated medically-TTE 02/06/21 showed normalization of EF, patient currently on aspirin 81, Plavix 75> resume now- no more bleeding, cont  Crestor.  history of PVC on mexiletine followed by Dr. Elberta Fortis -okay to hold while n.p.o. per cardiology  Hypokalemia will replace  Parkinson's disease:cont  Sinemet Celexa, holding ropinirole due to concern for diarrhea.> Follow-up with PCP/neurology  BPH/Prostate CA history: Continue  finasteride tamsulosin follow-up with urology  HLD: Continue Crestor LDL stable at 23.   HTN: BP remains slightly on higher side  preDM: HbA1c 5.3.   Consults: Cardiology and general surgery Subjective: Alert oriented no abdominal pain tolerating diet.  Discharge Exam: Vitals:   05/31/22 0440 05/31/22 0806   BP: (!) 148/90 (!) 151/88  Pulse: 88 67  Resp:  18  Temp: 98.7 F (37.1 C) 98.5 F (36.9 C)  SpO2: 95% 94%   General: Pt is alert, awake, not in acute distress Cardiovascular: RRR, S1/S2 +, no rubs, no gallops Respiratory: CTA bilaterally, no wheezing, no rhonchi Abdominal: Soft, NT, ND, bowel sounds + Extremities: no edema, no cyanosis  Discharge Instructions  Discharge Instructions     Discharge instructions   Complete by: As directed    Please call call MD or return to ER for similar or worsening recurring problem that brought you to hospital or if any fever,nausea/vomiting,abdominal pain, uncontrolled pain, chest pain,  shortness of breath or any other alarming symptoms.  Please follow-up your doctor as instructed in a week time and call the office for appointment.  Please avoid alcohol, smoking, or any other illicit substance and maintain healthy habits including taking your regular medications as prescribed.  You were cared for by a hospitalist during your hospital stay. If you have any questions about your discharge medications or the care you received while you were in the hospital after you are discharged, you can call the unit and ask to speak with the hospitalist on call if the hospitalist that took care of you is not available.  Once you are discharged, your primary care physician will handle any further medical issues. Please note that NO REFILLS for any discharge medications will be authorized once you are discharged, as it is imperative that you return to your primary care physician (or establish a relationship with a primary care physician if you do not have one) for your aftercare needs so that they can reassess your need for medications and monitor your lab values   Increase activity slowly   Complete by: As directed       Allergies as of 05/31/2022       Reactions   Floxin I.v. In Dextrose 5% [ofloxacin] Other (See Comments)   Lowers BP Lowers BP    Terazosin Other (See Comments)   Lower bp        Medication List     TAKE these medications    acetaminophen 500 MG tablet Commonly known as: TYLENOL Take 1,000 mg by mouth in the morning and at bedtime.   AMBULATORY NON FORMULARY MEDICATION Dispense 1 electric scooter DX G20.A 1   Aspirin Low Dose 81 MG tablet Generic drug: aspirin EC TAKE 1 TABLET BY MOUTH EVERY DAY   Biotin [address] MCG Tabs Take 1,000 mcg by mouth in the morning.   carbidopa-levodopa 50-200 MG tablet Commonly known as: SINEMET CR TAKE 1 TABLET BY MOUTH EVERYDAY AT BEDTIME   carbidopa-levodopa 25-100 MG tablet Commonly known as: SINEMET IR 2 TABLETS AT 7 AM, 2 TABLETS AT 10 AM,2 TABLETS AT 1PM,2 TABLETS AT 4 PM /2 TABLETS AT 7 pm   cholecalciferol 25 MCG (1000 UNIT) tablet Commonly known as: VITAMIN D3 Take 1,000 Units by mouth daily.   clopidogrel 75 MG tablet Commonly known as: Plavix Take 1 tablet (75 mg total) by mouth daily.   Dymista 137-50 MCG/ACT Susp Generic drug: Azelastine-Fluticasone Place 1 puff into both nostrils at bedtime.  escitalopram 10 MG tablet Commonly known as: LEXAPRO Take 20 mg by mouth daily.   finasteride 5 MG tablet Commonly known as: PROSCAR Take 5 mg by mouth every evening.   Fish Oil 1000 MG Caps Take 2,000 mg by mouth in the morning.   lansoprazole 30 MG capsule Commonly known as: PREVACID Take 30 mg by mouth 2 (two) times daily. Morning & mid-afternoon   levocetirizine 5 MG tablet Commonly known as: XYZAL Take 5 mg by mouth every evening.   metoprolol succinate 25 MG 24 hr tablet Commonly known as: TOPROL-XL TAKE 1/2 TABLET BY MOUTH DAILY   mexiletine 150 MG capsule Commonly known as: MEXITIL Take 1 capsule (150 mg total) by mouth 2 (two) times daily.   midodrine 5 MG tablet Commonly known as: PROAMATINE TAKE 1 TABLET BY MOUTH 2 TIMES DAILY AS NEEDED. TAKE IF SYSTOLIC BLOOD PRESSURE GETS BELOW 100   montelukast 10 MG tablet Commonly known  as: SINGULAIR Take 10 mg by mouth at bedtime.   nitroGLYCERIN 0.4 MG SL tablet Commonly known as: NITROSTAT Place 1 tablet (0.4 mg total) under the tongue every 5 (five) minutes as needed for chest pain (Do not give more than 3 SL tablets in 15 minutes.).   Proctosol HC 2.5 % rectal cream Generic drug: hydrocortisone APPLY INTO AND AROUND RECTUM 2 TIMES A DAY   rOPINIRole 0.25 MG tablet Commonly known as: REQUIP 1 po tid for 1 week, then 2 po tid, then 3 tabs po tid x 1 week   rOPINIRole 1 MG tablet Commonly known as: REQUIP Take 1 tablet (1 mg total) by mouth 3 (three) times daily.   rosuvastatin 20 MG tablet Commonly known as: CRESTOR Take 1 tablet (20 mg total) by mouth at bedtime.   tamsulosin 0.4 MG Caps capsule Commonly known as: FLOMAX Take 0.4 mg by mouth daily after supper.   traMADol 50 MG tablet Commonly known as: ULTRAM Take 50 mg by mouth every 6 (six) hours as needed. for pain   VITAMIN B 12 PO Take 1,000 mcg by mouth daily.   zolpidem 10 MG tablet Commonly known as: AMBIEN Take 10 mg by mouth at bedtime.        Follow-up Information     Laurann Montana, PA-C Follow up.   Specialties: Cardiology, Radiology Why: [facility] - [address] location - cardiology follow-up arranged on Wednesday Jun 10, 2022 at 1:55 PM (Arrive by 1:40 PM).  Kriste Basque is one of the PAs you met in the hospital who works with Dr. Anne Fu. You'll also keep your follow-up scheduled with Dr. Elberta Fortis in June. Contact information: [address] [place] [address] [phone]                Allergies  Allergen Reactions   Floxin I.V. In Dextrose 5% [Ofloxacin] Other (See Comments)    Lowers BP Lowers BP   Terazosin Other (See Comments)    Lower bp    The results of significant diagnostics from this hospitalization (including imaging, microbiology, ancillary and laboratory) are listed below for reference.    Microbiology: Recent Results (from  the past 240 hour(s))  Resp panel by RT-PCR (RSV, Flu A&B, Covid) Anterior Nasal Swab     Status: None   Collection Time: 05/26/22  3:35 PM   Specimen: Anterior Nasal Swab  Result Value Ref Range Status   SARS Coronavirus 2 by RT PCR NEGATIVE NEGATIVE Final    Comment: (NOTE) SARS-CoV-2 target nucleic acids  are NOT DETECTED.  The SARS-CoV-2 RNA is generally detectable in upper respiratory specimens during the acute phase of infection. The lowest concentration of SARS-CoV-2 viral copies this assay can detect is 138 copies/mL. A negative result does not preclude SARS-Cov-2 infection and should not be used as the sole basis for treatment or other patient management decisions. A negative result may occur with  improper specimen collection/handling, submission of specimen other than nasopharyngeal swab, presence of viral mutation(s) within the areas targeted by this assay, and inadequate number of viral copies(<138 copies/mL). A negative result must be combined with clinical observations, patient history, and epidemiological information. The expected result is Negative.  Fact Sheet for Patients:  BloggerCourse.com  Fact Sheet for Healthcare Providers:  SeriousBroker.it  This test is no t yet approved or cleared by the [place] FDA and  has been authorized for detection and/or diagnosis of SARS-CoV-2 by FDA under an Emergency Use Authorization (EUA). This EUA will remain  in effect (meaning this test can be used) for the duration of the COVID-19 declaration under Section 564(b)(1) of the Act, 21 U.S.C.section 360bbb-3(b)(1), unless the authorization is terminated  or revoked sooner.       Influenza A by PCR NEGATIVE NEGATIVE Final   Influenza B by PCR NEGATIVE NEGATIVE Final    Comment: (NOTE) The Xpert Xpress SARS-CoV-2/FLU/RSV plus assay is intended as an aid in the diagnosis of influenza from Nasopharyngeal swab specimens  and should not be used as a sole basis for treatment. Nasal washings and aspirates are unacceptable for Xpert Xpress SARS-CoV-2/FLU/RSV testing.  Fact Sheet for Patients: BloggerCourse.com  Fact Sheet for Healthcare Providers: SeriousBroker.it  This test is not yet approved or cleared by the [place] FDA and has been authorized for detection and/or diagnosis of SARS-CoV-2 by FDA under an Emergency Use Authorization (EUA). This EUA will remain in effect (meaning this test can be used) for the duration of the COVID-19 declaration under Section 564(b)(1) of the Act, 21 U.S.C. section 360bbb-3(b)(1), unless the authorization is terminated or revoked.     Resp Syncytial Virus by PCR NEGATIVE NEGATIVE Final    Comment: (NOTE) Fact Sheet for Patients: BloggerCourse.com  Fact Sheet for Healthcare Providers: SeriousBroker.it  This test is not yet approved or cleared by the [place] FDA and has been authorized for detection and/or diagnosis of SARS-CoV-2 by FDA under an Emergency Use Authorization (EUA). This EUA will remain in effect (meaning this test can be used) for the duration of the COVID-19 declaration under Section 564(b)(1) of the Act, 21 U.S.C. section 360bbb-3(b)(1), unless the authorization is terminated or revoked.  Performed at [facility], [address] [place], [place] [address]     Procedures/Studies: DG Abd Portable 1V  Result Date: 05/29/2022 CLINICAL DATA:  Enteric catheter placement EXAM: PORTABLE ABDOMEN - 1 VIEW COMPARISON:  05/29/2022 FINDINGS: Frontal view of the lower chest and upper abdomen demonstrates enteric catheter passing below diaphragm tip and side port projecting over the gastric fundus. Oral contrast is seen throughout the colon. No evidence of bowel obstruction. IMPRESSION: 1. Enteric catheter tip projecting over the  gastric fundus. Electronically Signed   By: Sharlet Salina M.D.   On: 05/29/2022 23:51   DG Abd Portable 1V-Small Bowel Obstruction Protocol-initial, 8 hr delay  Result Date: 05/29/2022 CLINICAL DATA:  Nasogastric tube placement EXAM: PORTABLE ABDOMEN - 1 VIEW COMPARISON:  6:16 p.m. FINDINGS: Nasogastric tube tip is seen just beyond the gastroesophageal junction with the proximal side hole position at  the junction itself. Administered oral contrast is now seen throughout a nondistended colon. No free intraperitoneal gas. Lumbar fusion with instrumentation and right inguinal hernia repair with mesh has been performed. IMPRESSION: 1. Nasogastric tube tip just beyond the gastroesophageal junction with the proximal side hole position at the junction itself. Advancement of the catheter by 10-15 cm is recommended for optimal positioning. Electronically Signed   By: Helyn Numbers M.D.   On: 05/29/2022 22:59   DG Abd Portable 1V  Result Date: 05/29/2022 CLINICAL DATA:  NG tube placement EXAM: PORTABLE ABDOMEN - 1 VIEW COMPARISON:  05/29/2022 FINDINGS: Esophageal tube tip and side port slightly beyond GE junction. This appears retracted compared to prior. Enteral contrast in the stomach and colon. Contrast filled dilated loop of small bowel in the left upper quadrant measuring 5 cm. IMPRESSION: Esophageal tube tip and side port slightly beyond GE junction but appears retracted compared to prior. Contrast opacified dilated small bowel in the left upper quadrant Electronically Signed   By: Jasmine Pang M.D.   On: 05/29/2022 18:33   DG Abd 1 View  Result Date: 05/29/2022 CLINICAL DATA:  [id number] Encounter for imaging study to confirm nasogastric (NG) tube placement [id number] EXAM: ABDOMEN - 1 VIEW COMPARISON:  Radiograph 05/29/2022 FINDINGS: Nasogastric tube tip and side port overlie the stomach, advanced from prior. Persistent mildly dilated small bowel loops, not significantly changed. IMPRESSION: Nasogastric tube  tip and side port overlie the stomach, advanced from prior. Unchanged multiple dilated loops of small bowel compatible with obstruction. Electronically Signed   By: Caprice Renshaw M.D.   On: 05/29/2022 10:56   DG Abd Portable 1V-Small Bowel Protocol-Position Verification  Result Date: 05/29/2022 CLINICAL DATA:  [id number] Encounter for imaging study to confirm nasogastric (NG) tube placement [id number] EXAM: PORTABLE ABDOMEN - 1 VIEW COMPARISON:  Radiograph 05/29/2022 FINDINGS: Nasogastric tube side port overlies the proximal stomach, similar to prior. There is slight increased slack in the distal esophagus. Persistent mildly dilated loops of small bowel in the central abdomen. IMPRESSION: Nasogastric tube side port overlies the proximal stomach, similar to prior. Persistent multiple dilated loops of small bowel compatible with obstruction. Electronically Signed   By: Caprice Renshaw M.D.   On: 05/29/2022 09:57   DG Abd Portable 1V  Result Date: 05/29/2022 CLINICAL DATA:  [id number] Encounter for nasogastric (NG) tube placement [id number] EXAM: PORTABLE ABDOMEN - 1 VIEW COMPARISON:  CT 05/28/2022 FINDINGS: Nasogastric tube tip and side port overlie the proximal stomach, side-port near the region of the GE junction. Persistent mildly dilated loops of small bowel in the central abdomen. IMPRESSION: Nasogastric tube tip and side port overlie the proximal stomach, side port near the region of the GE junction. Could advance by 5.0 cm to ensure adequate placement. Electronically Signed   By: Caprice Renshaw M.D.   On: 05/29/2022 09:19   CT ABDOMEN PELVIS W CONTRAST  Result Date: 05/28/2022 CLINICAL DATA:  Acute abdominal pain EXAM: CT ABDOMEN AND PELVIS WITH CONTRAST TECHNIQUE: Multidetector CT imaging of the abdomen and pelvis was performed using the standard protocol following bolus administration of intravenous contrast. RADIATION DOSE REDUCTION: This exam was performed according to the departmental dose-optimization program which  includes automated exposure control, adjustment of the mA and/or kV according to patient size and/or use of iterative reconstruction technique. CONTRAST:  75mL OMNIPAQUE IOHEXOL 350 MG/ML SOLN COMPARISON:  CT chest abdomen and pelvis 02/05/2021 FINDINGS: Lower chest: There is atelectasis in the lung bases. Hepatobiliary: No focal liver abnormality is seen. Status  post cholecystectomy. No biliary dilatation. Pancreas: Unremarkable. No pancreatic ductal dilatation or surrounding inflammatory changes. Spleen: Normal in size without focal abnormality. Adrenals/Urinary Tract: Adrenal glands are unremarkable. Kidneys are normal, without renal calculi, focal lesion, or hydronephrosis. Bladder is unremarkable. Stomach/Bowel: There are dilated mid and proximal small bowel loops with air-fluid levels measuring up to 4.6 Cm. There is some associated mesenteric edema. Stomach is distended with fluid and there is also fluid in the distal esophagus which can be seen with gastroesophageal reflux. A transition point is seen in the central abdomen image 3/68. Distal small bowel and colon are nondilated. There is colonic diverticulosis without evidence for acute diverticulitis. The appendix is within normal limits. There is no pneumatosis or free air. Vascular/Lymphatic: Aortic atherosclerosis. No enlarged abdominal or pelvic lymph nodes. Reproductive: Prostate is unremarkable. There surgical clips near the prostate gland. Other: There is no ascites or free air. There has been prior right inguinal hernia repair. There are very small fat containing bilateral inguinal and umbilical hernias. Musculoskeletal: L3-S1 posterior fusion hardware is present. IMPRESSION: 1. Findings compatible with small bowel obstruction with transition point in the central abdomen. There is associated mesenteric edema. No pneumatosis or free air. 2. Colonic diverticulosis. Aortic Atherosclerosis (ICD10-I70.0). Electronically Signed   By: Darliss Cheney M.D.    On: 05/28/2022 18:54   NM Myocar Multi W/Spect Izetta Dakin Motion / EF  Result Date: 05/28/2022 CLINICAL DATA:  Chest pain.  Intermediate CAD risk. EXAM: MYOCARDIAL IMAGING WITH SPECT (REST AND PHARMACOLOGIC-STRESS) GATED LEFT VENTRICULAR WALL MOTION STUDY LEFT VENTRICULAR EJECTION FRACTION TECHNIQUE: Standard myocardial SPECT imaging was performed after resting intravenous injection of 9.9 mCi Tc-23m tetrofosmin. Subsequently, intravenous infusion of Lexiscan was performed under the supervision of the Cardiology staff. At peak effect of the drug, 31.4 mCi Tc-67m tetrofosmin was injected intravenously and standard myocardial SPECT imaging was performed. Quantitative gated imaging was also performed to evaluate left ventricular wall motion, and estimate left ventricular ejection fraction. COMPARISON:  None Available. FINDINGS: Perfusion: A small fixed defect is seen in the distal inferolateral wall of the left ventricle on both stress and resting images, likely due to diaphragmatic attenuation artifact. No reversible myocardial perfusion defects are seen to suggest reversible myocardial ischemia. Wall Motion: Normal left ventricular wall motion. No left ventricular dilation. Left Ventricular Ejection Fraction: 48 % End diastolic volume 92 ml End systolic volume 47 ml IMPRESSION: 1. No reversible ischemia. Probable diaphragmatic attenuation artifact. 2. Normal left ventricular wall motion. 3. Left ventricular ejection fraction 48% 4. Non invasive risk stratification*: Low *2012 Appropriate Use Criteria for Coronary Revascularization Focused Update: J Am Coll Cardiol. 2012;59(9):857-881. http://content.dementiazones.com.aspx?articleid=1201161 Electronically Signed   By: Danae Orleans M.D.   On: 05/28/2022 15:11   DG Abd Portable 1V  Result Date: 05/28/2022 CLINICAL DATA:  [id number] Pain in the abdomen [id number] EXAM: PORTABLE ABDOMEN - 1 VIEW COMPARISON:  CT 02/05/2021 FINDINGS: Stomach is nondistended. Multiple gas  dilated small bowel loops in the mid abdomen. Colon is nondilated. Cholecystectomy clips. Lumbosacral fixation hardware with adjacent level degenerative disc disease L2-3. IMPRESSION: Dilated small bowel loops suggesting small bowel obstruction. Electronically Signed   By: Corlis Leak M.D.   On: 05/28/2022 14:42   ECHOCARDIOGRAM COMPLETE  Result Date: 05/27/2022    ECHOCARDIOGRAM REPORT   Patient Name:   Joshua [name] Date of Exam: 05/27/2022 Medical Rec #:  [id number]            Height:       73.0 in Accession #:  [id number]           Weight:       220.2 lb Date of Birth:  1945-10-08           BSA:          2.241 m Patient Age:    76 years             BP:           110/68 mmHg Patient Gender: M                    HR:           82 bpm. Exam Location:  Inpatient Procedure: 2D Echo, Color Doppler, Cardiac Doppler and Intracardiac            Opacification Agent Indications:    Chest Pain  History:        Patient has prior history of Echocardiogram examinations, most                 recent 02/06/2021. Previous Myocardial Infarction and CAD, OSA                 and Stroke, Arrythmias:RBBB; Risk Factors:Dyslipidemia and                 Hypertension.  Sonographer:    Milbert Coulter Referring Phys: [id number] JENNIFER YATES  Sonographer Comments: Suboptimal apical window. Image acquisition challenging due to patient body habitus. IMPRESSIONS  1. Left ventricular ejection fraction, by estimation, is 60 to 65%. The left ventricle has normal function. The left ventricle has no regional wall motion abnormalities. There is mild concentric left ventricular hypertrophy. Left ventricular diastolic parameters are consistent with Grade I diastolic dysfunction (impaired relaxation).  2. Right ventricular systolic function is normal. The right ventricular size is not well visualized.  3. The mitral valve was not well visualized. No evidence of mitral valve regurgitation.  4. The aortic valve was not well visualized. Aortic valve  regurgitation is not visualized. No aortic stenosis is present. Comparison(s): No significant change from prior study. This study is technically more challenging from prior. FINDINGS  Left Ventricle: Left ventricular ejection fraction, by estimation, is 60 to 65%. The left ventricle has normal function. The left ventricle has no regional wall motion abnormalities. Definity contrast agent was given IV to delineate the left ventricular  endocardial borders. The left ventricular internal cavity size was normal in size. There is mild concentric left ventricular hypertrophy. Left ventricular diastolic parameters are consistent with Grade I diastolic dysfunction (impaired relaxation). Right Ventricle: The right ventricular size is not well visualized. Right vetricular wall thickness was not well visualized. Right ventricular systolic function is normal. Left Atrium: Left atrial size was not well visualized. Right Atrium: Right atrial size was not well visualized. Pericardium: There is no evidence of pericardial effusion. Mitral Valve: The mitral valve was not well visualized. No evidence of mitral valve regurgitation. Tricuspid Valve: The tricuspid valve is not well visualized. Tricuspid valve regurgitation is not demonstrated. Aortic Valve: The aortic valve was not well visualized. Aortic valve regurgitation is not visualized. No aortic stenosis is present. Aortic valve mean gradient measures 4.0 mmHg. Aortic valve peak gradient measures 7.4 mmHg. Aortic valve area, by VTI measures 2.65 cm. Pulmonic Valve: The pulmonic valve was not well visualized. Pulmonic valve regurgitation is not visualized. Aorta: The aortic root is normal in size and structure and the ascending aorta was not well visualized. IAS/Shunts: The interatrial septum was  not well visualized.  LEFT VENTRICLE PLAX 2D LVIDd:         4.50 cm   Diastology LVIDs:         3.30 cm   LV e' medial:    5.98 cm/s LV PW:         1.30 cm   LV E/e' medial:  7.1 LV IVS:         1.20 cm   LV e' lateral:   6.64 cm/s LVOT diam:     2.10 cm   LV E/e' lateral: 6.4 LV SV:         62 LV SV Index:   28 LVOT Area:     3.46 cm  RIGHT VENTRICLE RV S prime:     13.40 cm/s TAPSE (M-mode): 2.0 cm LEFT ATRIUM             Index LA diam:        3.00 cm 1.34 cm/m LA Vol (A2C):   49.7 ml 22.17 ml/m LA Vol (A4C):   43.6 ml 19.45 ml/m LA Biplane Vol: 50.3 ml 22.44 ml/m  AORTIC VALVE AV Area (Vmax):    2.65 cm AV Area (Vmean):   2.52 cm AV Area (VTI):     2.65 cm AV Vmax:           136.00 cm/s AV Vmean:          92.400 cm/s AV VTI:            0.233 m AV Peak Grad:      7.4 mmHg AV Mean Grad:      4.0 mmHg LVOT Vmax:         104.00 cm/s LVOT Vmean:        67.100 cm/s LVOT VTI:          0.178 m LVOT/AV VTI ratio: 0.76  AORTA Ao Root diam: 3.00 cm MITRAL VALVE MV Area (PHT): 4.63 cm    SHUNTS MV Decel Time: 164 msec    Systemic VTI:  0.18 m MV E velocity: 42.40 cm/s  Systemic Diam: 2.10 cm MV A velocity: 83.80 cm/s MV E/A ratio:  0.51 Riley Lam MD Electronically signed by Riley Lam MD Signature Date/Time: 05/27/2022/4:23:01 PM    Final    DG Chest Portable 1 View  Result Date: 05/26/2022 CLINICAL DATA:  Shortness of breath and chest pain. History of coronary artery disease, asthma, and hypertension. EXAM: PORTABLE CHEST 1 VIEW COMPARISON:  Chest radiographs 02/05/2021 and 01/06/2018 FINDINGS: Cardiac silhouette is again at the upper limits of normal size for AP technique. There is again a mildly tortuous thoracic aorta. Mild atherosclerotic calcifications. Mildly decreased lung volumes. The lungs are clear. No pleural effusion or pneumothorax. ACDF hardware overlies the lower cervical spine. IMPRESSION: Mildly decreased lung volumes. No acute cardiopulmonary process. Electronically Signed   By: Neita Garnet M.D.   On: 05/26/2022 10:06    Labs: BNP (last 3 results) Recent Labs    05/26/22 0955  BNP 33.4   Basic Metabolic Panel: Recent Labs  Lab 05/26/22 0933  05/27/22 0102 05/29/22 0043 05/30/22 0043 05/31/22 0056  NA 137 132* 131* 136 135  K 4.0 3.3* 4.2 3.3* 4.5  CL 104 102 98 104 102  CO2 21* 22 25 22 25   GLUCOSE 127* 106* 106* 84 95  BUN 20 22 10 10  7*  CREATININE 0.78 0.96 0.74 0.82 0.74  CALCIUM 9.1 8.5* 8.6* 8.1* 8.8*   Liver Function Tests: Recent Labs  Lab 05/29/22 0043 05/30/22 0043 05/31/22 0056  AST 13* 25 23  ALT 7 10 7   ALKPHOS 44 43 44  BILITOT 1.3* 1.4* 1.5*  PROT 5.8* 5.3* 5.5*  ALBUMIN 3.1* 2.9* 3.1*   Recent Labs  Lab 05/26/22 1831  LIPASE 23   No results for input(s): "AMMONIA" in the last 168 hours. CBC: Recent Labs  Lab 05/26/22 0933 05/27/22 0102 05/29/22 0043 05/30/22 0043 05/31/22 0056  WBC 5.1 5.5 6.3 6.4 6.5  HGB 15.7 13.2 13.6 12.8* 12.7*  HCT 46.4 39.7 40.1 38.0* 37.5*  MCV 96.9 97.1 93.5 95.2 94.7  PLT 197 174 188 186 193   Cardiac Enzymes: No results for input(s): "CKTOTAL", "CKMB", "CKMBINDEX", "TROPONINI" in the last 168 hours. BNP: Invalid input(s): "POCBNP" CBG: No results for input(s): "GLUCAP" in the last 168 hours. D-Dimer No results for input(s): "DDIMER" in the last 72 hours. Hgb A1c No results for input(s): "HGBA1C" in the last 72 hours. Lipid Profile No results for input(s): "CHOL", "HDL", "LDLCALC", "TRIG", "CHOLHDL", "LDLDIRECT" in the last 72 hours. Thyroid function studies No results for input(s): "TSH", "T4TOTAL", "T3FREE", "THYROIDAB" in the last 72 hours.  Invalid input(s): "FREET3" Anemia work up No results for input(s): "VITAMINB12", "FOLATE", "FERRITIN", "TIBC", "IRON", "RETICCTPCT" in the last 72 hours. Urinalysis No results found for: "COLORURINE", "APPEARANCEUR", "LABSPEC", "PHURINE", "GLUCOSEU", "HGBUR", "BILIRUBINUR", "KETONESUR", "PROTEINUR", "UROBILINOGEN", "NITRITE", "LEUKOCYTESUR" Sepsis Labs Recent Labs  Lab 05/27/22 0102 05/29/22 0043 05/30/22 0043 05/31/22 0056  WBC 5.5 6.3 6.4 6.5   Microbiology Recent Results (from the past 240  hour(s))  Resp panel by RT-PCR (RSV, Flu A&B, Covid) Anterior Nasal Swab     Status: None   Collection Time: 05/26/22  3:35 PM   Specimen: Anterior Nasal Swab  Result Value Ref Range Status   SARS Coronavirus 2 by RT PCR NEGATIVE NEGATIVE Final    Comment: (NOTE) SARS-CoV-2 target nucleic acids are NOT DETECTED.  The SARS-CoV-2 RNA is generally detectable in upper respiratory specimens during the acute phase of infection. The lowest concentration of SARS-CoV-2 viral copies this assay can detect is 138 copies/mL. A negative result does not preclude SARS-Cov-2 infection and should not be used as the sole basis for treatment or other patient management decisions. A negative result may occur with  improper specimen collection/handling, submission of specimen other than nasopharyngeal swab, presence of viral mutation(s) within the areas targeted by this assay, and inadequate number of viral copies(<138 copies/mL). A negative result must be combined with clinical observations, patient history, and epidemiological information. The expected result is Negative.  Fact Sheet for Patients:  BloggerCourse.com  Fact Sheet for Healthcare Providers:  SeriousBroker.it  This test is no t yet approved or cleared by the [place] FDA and  has been authorized for detection and/or diagnosis of SARS-CoV-2 by FDA under an Emergency Use Authorization (EUA). This EUA will remain  in effect (meaning this test can be used) for the duration of the COVID-19 declaration under Section 564(b)(1) of the Act, 21 U.S.C.section 360bbb-3(b)(1), unless the authorization is terminated  or revoked sooner.       Influenza A by PCR NEGATIVE NEGATIVE Final   Influenza B by PCR NEGATIVE NEGATIVE Final    Comment: (NOTE) The Xpert Xpress SARS-CoV-2/FLU/RSV plus assay is intended as an aid in the diagnosis of influenza from Nasopharyngeal swab specimens and should not  be used as a sole basis for treatment. Nasal washings and aspirates are unacceptable for Xpert Xpress SARS-CoV-2/FLU/RSV testing.  Fact Sheet for Patients:  BloggerCourse.com  Fact Sheet for Healthcare Providers: SeriousBroker.it  This test is not yet approved or cleared by the [place] FDA and has been authorized for detection and/or diagnosis of SARS-CoV-2 by FDA under an Emergency Use Authorization (EUA). This EUA will remain in effect (meaning this test can be used) for the duration of the COVID-19 declaration under Section 564(b)(1) of the Act, 21 U.S.C. section 360bbb-3(b)(1), unless the authorization is terminated or revoked.     Resp Syncytial Virus by PCR NEGATIVE NEGATIVE Final    Comment: (NOTE) Fact Sheet for Patients: BloggerCourse.com  Fact Sheet for Healthcare Providers: SeriousBroker.it  This test is not yet approved or cleared by the [place] FDA and has been authorized for detection and/or diagnosis of SARS-CoV-2 by FDA under an Emergency Use Authorization (EUA). This EUA will remain in effect (meaning this test can be used) for the duration of the COVID-19 declaration under Section 564(b)(1) of the Act, 21 U.S.C. section 360bbb-3(b)(1), unless the authorization is terminated or revoked.  Performed at [facility], [address] [place], [place] [address]      Time coordinating discharge: 25 minutes  SIGNED: Lanae Boast, MD  [facility] 05/31/2022, 11:00 AM  If 7PM-7AM, please contact night-coverage [email or web address]

## 2022-05-31 NOTE — Progress Notes (Signed)
Patient and wife Joshua [name] verbalized understanding of dc insructions. All belongings and dc papers given to patient.

## 2022-06-09 NOTE — Progress Notes (Unsigned)
Cardiology Office Note    Date:  06/09/2022   ID:  Joshua, [name] Sep 04, 1945, MRN [id number]  PCP:  [name], MD  Cardiologist:  Donato Schultz, MD  Electrophysiologist:  Regan Lemming, MD   Chief Complaint: ***  History of Present Illness:   Paydon Kolling is a 77 y.o. male with history of CAD, anxiety, arthritis, asthma, Barrett's esophagus, diverticulosis, dyslipidemia, GERD, HTN, PVCs s/p prior ablation 2017, stroke, prostate CA, Parkinson's disease, RBBB, TIA who is seen for post-hospital f/u. Follows with Dr. Anne Fu for CAD and Dr. Kathlynn Grate for PVCs. He had a loop recorder placed in 2017 in setting of a stroke. This subsequently found high PVC burden.  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 He underwent PVC ablation in 2017 with subsequent flecianide rx for suppression. He had NSTEMI 01/2021 with no clear cuplprit - did have distal LAD stenosis as well as distal ramus stenosis supplying small segments and diagonal disease. Initially, the LAD was thought to be occluded but in reality, it had separate ostium from the left circumflex. Medical therapy recommended. LVEF 35-40% by cath but 60-65% by echo at that time. Flecainide was switched to mexiletine. He was recently in the hospital for chest pain and had low risk nuc. He was ultimately found to have SBO managed conservatively.  CAD PVCs Essential HTN RRBB  Labwork independently reviewed: 05/2022 K 4.5, albumin 3.1, AST ALT OK, hgb 12.7, plt OK, TSH OK, LDL 23    Past History   Past Medical History:  Diagnosis Date   Allergic rhinitis    Anxiety    from chronic pain from surgery- on Cymbalta   Arthritis    Asthma    Barrett's esophagus 03/29/2014   Carotid artery disease (HCC)    Carotid Doppler normal August, 2007   Diverticulosis    Dyslipidemia    GERD (gastroesophageal reflux disease)    History of loop  recorder    has since 04/04/15   HTN (hypertension)    takes Metoprolol for PVC control   Hx of colonic polyps    adenomatous   IFG (impaired fasting glucose)    Palpitations    Benign PVCs   Parkinson disease    Prostate cancer (HCC)    RBBB (right bundle branch block)    rate related   Shingles    TIA (transient ischemic attack) 02/2015   Per pt, had 2 strokes   Vertigo     Past Surgical History:  Procedure Laterality Date   BACK SURGERY  2002,2009   x 6   CHOLECYSTECTOMY  11/30/2012   with IOC   COLONOSCOPY     ELECTROPHYSIOLOGIC STUDY N/A 09/26/2015   Procedure: V Tach Ablation (PVC);  Surgeon: Will Jorja Loa, MD;  Location: [facility];  Service: Cardiovascular;  Laterality: N/A;   EP IMPLANTABLE DEVICE N/A 04/04/2015   Procedure: Loop Recorder Insertion;  Surgeon: Will Jorja Loa, MD;  Location: [facility];  Service: Cardiovascular;  Laterality: N/A;   HERNIA REPAIR     laprascopic   KNEE SURGERY     DECEMBER 2017,LEFT KNEE SCOPED   LEFT HEART CATH AND CORONARY ANGIOGRAPHY N/A 02/05/2021   Procedure: LEFT HEART CATH AND CORONARY ANGIOGRAPHY;  Surgeon: Iran Ouch, MD;  Location: [facility];  Service: Cardiovascular;  Laterality: N/A;   NECK SURGERY  2002   POLYPECTOMY     ROTATOR CUFF REPAIR Left    TEE WITHOUT CARDIOVERSION N/A 04/04/2015   Procedure: TRANSESOPHAGEAL ECHOCARDIOGRAM (TEE);  Surgeon: Lewayne Bunting, MD;  Location: [facility] ENDOSCOPY;  Service: Cardiovascular;  Laterality: N/A;   TRIGGER FINGER RELEASE Left 12/28/2019   Procedure: RELEASE TRIGGER FINGER/A-1 PULLEY THUMB, MIDDLE AND RING;  Surgeon: Cindee Salt, MD;  Location: [facility];  Service: Orthopedics;  Laterality: Left;  FAB   UPPER GASTROINTESTINAL ENDOSCOPY     V Tach ablation  09/26/2015    Current Medications: No outpatient medications have been marked as taking for the 06/10/22 encounter (Appointment) with Laurann Montana, PA-C.   ***    Allergies:   Floxin i.v. in dextrose 5% [ofloxacin] and Terazosin   Social History   Socioeconomic History   Marital status: Married    Spouse name: Joshua [name]    Number of children: 2   Years of education: 15   Highest education level: Some college, no degree  Occupational History   Occupation: Retired    Comment: Social research officer, government  Tobacco Use   Smoking status: Never   Smokeless tobacco: Never  Building services engineer Use: Never used  Substance and Sexual Activity   Alcohol use: No    Alcohol/week: 0.0 standard drinks of alcohol   Drug use: No   Sexual activity: Not Currently  Other Topics Concern   Not on file  Social History Narrative   Lives with wife.    Caffeine use: Coffee/tea/soda- ocassionally   Right-handed.   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   Social Determinants of Corporate investment banker Strain: Not on file  Food Insecurity: No Food Insecurity (05/26/2022)   Hunger Vital Sign    Worried About Running Out of Food in the Last Year: Never true    Ran Out of Food in the Last Year: Never true  Transportation Needs: No Transportation Needs (05/26/2022)   PRAPARE - Administrator, Civil Service (Medical): No    Lack of Transportation (Non-Medical): No  Physical Activity: Not on file  Stress: Not on file  Social Connections: Not on file     Family History:  The patient's ***family history includes Breast cancer in his paternal grandmother; Clotting disorder in his father; Healthy in his daughter and daughter; Heart attack in his father and paternal grandfather; Heart disease in his brother and father; Hypertension in his mother; Lung cancer in his brother; Prostate cancer in his brother. There is no history of Esophageal cancer, Stomach cancer, Rectal cancer, or Colon polyps.  ROS:   Please see the history of present illness. Otherwise, review of systems is positive for ***.  All other systems are reviewed and otherwise negative.    EKG(s)/Additional  Testing   EKG:  EKG is ordered today, personally reviewed, demonstrating ***  CV Studies: Cardiac studies reviewed are outlined and summarized above. Otherwise please see EMR for full report.  Recent Labs: 05/26/2022: B Natriuretic Peptide 33.4; TSH 0.893 05/31/2022: ALT 7; BUN 7; Creatinine, Ser 0.74; Hemoglobin 12.7; Platelets 193; Potassium 4.5; Sodium 135  Recent Lipid Panel    Component Value Date/Time   CHOL 85 05/26/2022 1831   TRIG 80 05/26/2022 1831   HDL 46 05/26/2022 1831   CHOLHDL 1.8 05/26/2022 1831   VLDL 16 05/26/2022 1831   LDLCALC 23 05/26/2022 1831    PHYSICAL EXAM:    VS:  There were no vitals taken  for this visit.  BMI: There is no height or weight on file to calculate BMI.  GEN: Well nourished, well developed male in no acute distress HEENT: normocephalic, atraumatic Neck: no JVD, carotid bruits, or masses Cardiac: ***RRR; no murmurs, rubs, or gallops, no edema  Respiratory:  clear to auscultation bilaterally, normal work of breathing GI: soft, nontender, nondistended, + BS MS: no deformity or atrophy Skin: warm and dry, no rash Neuro:  Alert and Oriented x 3, Strength and sensation are intact, follows commands Psych: euthymic mood, full affect  Wt Readings from Last 3 Encounters:  05/31/22 220 lb 9.6 oz (100.1 kg)  05/21/22 226 lb 12.8 oz (102.9 kg)  04/08/22 223 lb 6.4 oz (101.3 kg)     ASSESSMENT & PLAN:   ***     Disposition: F/u with ***   Medication Adjustments/Labs and Tests Ordered: Current medicines are reviewed at length with the patient today.  Concerns regarding medicines are outlined above. Medication changes, Labs and Tests ordered today are summarized above and listed in the Patient Instructions accessible in Encounters.   Signed, Laurann Montana, PA-C  06/09/2022 7:15 PM    [facility] Phone: [phone]; Fax: [phone]

## 2022-06-10 ENCOUNTER — Ambulatory Visit: Payer: Medicare HMO | Attending: Physician Assistant | Admitting: Cardiology

## 2022-06-10 ENCOUNTER — Encounter: Payer: Self-pay | Admitting: Cardiology

## 2022-06-10 VITALS — BP 124/72 | HR 71 | Ht 73.0 in | Wt 221.6 lb

## 2022-06-10 DIAGNOSIS — G20B1 Parkinson's disease with dyskinesia, without mention of fluctuations: Secondary | ICD-10-CM

## 2022-06-10 DIAGNOSIS — I493 Ventricular premature depolarization: Secondary | ICD-10-CM

## 2022-06-10 DIAGNOSIS — I2510 Atherosclerotic heart disease of native coronary artery without angina pectoris: Secondary | ICD-10-CM

## 2022-06-10 DIAGNOSIS — G909 Disorder of the autonomic nervous system, unspecified: Secondary | ICD-10-CM | POA: Diagnosis not present

## 2022-06-10 DIAGNOSIS — I4510 Unspecified right bundle-branch block: Secondary | ICD-10-CM | POA: Diagnosis not present

## 2022-06-10 DIAGNOSIS — I10 Essential (primary) hypertension: Secondary | ICD-10-CM

## 2022-06-10 DIAGNOSIS — E785 Hyperlipidemia, unspecified: Secondary | ICD-10-CM

## 2022-06-10 NOTE — Patient Instructions (Signed)
Medication Instructions:  Your physician recommends that you continue on your current medications as directed. Please refer to the Current Medication list given to you today.  *If you need a refill on your cardiac medications before your next appointment, please call your pharmacy*  Lab Work: None ordered today.  Testing/Procedures: None ordered today.  Follow-Up: At [facility], you and your health needs are our priority.  As part of our continuing mission to provide you with exceptional heart care, we have created designated Provider Care Teams.  These Care Teams include your primary Cardiologist (physician) and Advanced Practice Providers (APPs -  Physician Assistants and Nurse Practitioners) who all work together to provide you with the care you need, when you need it.  Your next appointment:   6 month(s)  The format for your next appointment:   In Person  Provider:   Donato Schultz, MD  or Jari Favre, PA-C, Ronie Spies, PA-C, Robin Searing, NP, Eligha Bridegroom, NP, or Tereso Newcomer, PA-C

## 2022-06-10 NOTE — Progress Notes (Signed)
Cardiology Office Note:    Date:  06/10/2022   ID:  [name], [name] 1945/06/29, MRN [id number]  PCP:  Joshua Polka., MD   [facility] Providers Cardiologist:  Joshua Schultz, MD Electrophysiologist:  Joshua Jorja Loa, MD {   Referring MD: Joshua Polka., MD   Chief Complaint  Patient presents with   Hospitalization Follow-up    History of Present Illness:    Joshua [name] is a 77 y.o. male with a hx of HTN, coronary artery disease, RBBB, carotid artery disease, PVCs s/p ablation, Parkinson disease, HLD, anxiety, history of CVA. Patient is followed by Dr. Anne [name] and Dr. Elberta [name], and presents today for a hospital follow up appointment.   Per chart review, patient previously had a CVA in 02/2015. Echocardiogram 03/07/15 showed EF 50-55%, no regional wall motion abnormalities, mild MR. Carotid dopplers on 03/07/15 showed no evidence of hemodynamically significant ICA disease bilaterally. TEE 04/04/15 showed a patent foramen ovale. He had a loop recorder implanted on 04/04/15. Loop monitor detected increased frequency of PVCs in 06/2015, and patient ultimately underwent v-tach ablation on 09/26/15. PVC ablation was unsuccessful with only intermittent suppression of PVCs. He was started on flecainide. Carotid ultrasounds on 09/09/18 showed moderate heterogeneous and calcified plaque with no hemodynamically significant stenosis.   Patient was later admitted to [facility] in 01/2021 with NSTEMI. Left heart catheterization on 02/05/21 showed no clear culprit for NSTEMI and there was no significant disease affecting the main vessels. There was diffuse moderate disease in the LAD and RI. Patient was managed medically. Echocardiogram on 02/06/21 showed EF 60-65%, no regional wall motion abnormalities, mild LVH with grade 1 DD, normal RV systolic function, borderline dilation of the aortic root measuring 37 mm and mild dilation of the ascending aorta measuring 38 mm. Due to CAD,  patient had to be taken off flecainide and was instead started on mexiletine 250 mg BID. He did have GI upset on mexitil, so his dose was decreased to 150 mg BID.   Patient was admitted in 05/2022 after he presented to the ED with chest pain and shortness of breath. HsTn negative and BNP was within normal limits. He underwent echocardiogram on 05/27/22 that showed EF 60-65%, no regional wall motion abnormalities, grade I diastolic dysfunction, normal RV systolic function. Nuclear stress test on 05/28/22 showed no reversible ischemia, normal LV wall function. While admitted, patient was found to have diarrhea and abdominal pain. CT and x-ray showed SBP. He was treated with NG tube decompression. He did not require surgery. Patient was discharged on ASA, plavix, metoprolol succinate 12.5 mg daily, mexiletine 150 mg BID, midodrine PRN, crestor 20 mg daily.   Today, patient reports that he has been doing very well since being discharged from the hospital. He has not had any recurrence of chest pain since being discharged.  After being discharged from the hospital, he did feel a bit weak for a few days.  He has been working to increase his physical activity and has been tolerating it well.  Denies chest pain, shortness of breath, orthopnea, ankle edema, dizziness, syncope, near syncope.  If he notices that his systolic blood pressure is less than 100, he takes a dose of his midodrine.  He usually ends up taking one dose of midodrine every AM.  Reports that his blood pressure throughout the day is usually in the 120s/70s.  Denies having systolic blood pressure greater than 135.    Past Medical History:  Diagnosis Date  Allergic rhinitis    Anxiety    from chronic pain from surgery- on Cymbalta   Arthritis    Asthma    Barrett's esophagus 03/29/2014   Carotid artery disease (HCC)    Carotid Doppler normal August, 2007   Diverticulosis    Dyslipidemia    GERD (gastroesophageal reflux disease)    History of  loop recorder    has since 04/04/15   HTN (hypertension)    takes Metoprolol for PVC control   Hx of colonic polyps    adenomatous   IFG (impaired fasting glucose)    Palpitations    Benign PVCs   Parkinson disease    Prostate cancer (HCC)    RBBB (right bundle branch block)    rate related   Shingles    TIA (transient ischemic attack) 02/2015   Per pt, had 2 strokes   Vertigo     Past Surgical History:  Procedure Laterality Date   BACK SURGERY  2002,2009   x 6   CHOLECYSTECTOMY  11/30/2012   with IOC   COLONOSCOPY     ELECTROPHYSIOLOGIC STUDY N/A 09/26/2015   Procedure: V Tach Ablation (PVC);  Surgeon: Joshua Jorja Loa, MD;  Location: [facility] INVASIVE CV [facility];  Service: Cardiovascular;  Laterality: N/A;   EP IMPLANTABLE DEVICE N/A 04/04/2015   Procedure: Loop Recorder Insertion;  Surgeon: Joshua Jorja Loa, MD;  Location: [facility] INVASIVE CV [facility];  Service: Cardiovascular;  Laterality: N/A;   HERNIA REPAIR     laprascopic   KNEE SURGERY     DECEMBER 2017,LEFT KNEE SCOPED   LEFT HEART CATH AND CORONARY ANGIOGRAPHY N/A 02/05/2021   Procedure: LEFT HEART CATH AND CORONARY ANGIOGRAPHY;  Surgeon: Joshua Ouch, MD;  Location: [facility]VASIVE CV LAB;  Service: Cardiovascular;  Laterality: N/A;   NECK SURGERY  2002   POLYPECTOMY     ROTATOR CUFF REPAIR Left    TEE WITHOUT CARDIOVERSION N/A 04/04/2015   Procedure: TRANSESOPHAGEAL ECHOCARDIOGRAM (TEE);  Surgeon: Joshua Bunting, MD;  Location: [facility] ENDOSCOPY;  Service: Cardiovascular;  Laterality: N/A;   TRIGGER FINGER RELEASE Left 12/28/2019   Procedure: RELEASE TRIGGER FINGER/A-1 PULLEY THUMB, MIDDLE AND RING;  Surgeon: Joshua Salt, MD;  Location: [facility];  Service: Orthopedics;  Laterality: Left;  FAB   UPPER GASTROINTESTINAL ENDOSCOPY     V Tach ablation  09/26/2015    Current Medications: Current Meds  Medication Sig   acetaminophen (TYLENOL) 500 MG tablet Take 1,000 mg by mouth in the morning and at bedtime.    ASPIRIN LOW DOSE 81 MG tablet TAKE 1 TABLET BY MOUTH EVERY DAY   Biotin [address]109 MCG TABS Take 1,000 mcg by mouth in the morning.   carbidopa-levodopa (SINEMET CR) 50-200 MG tablet TAKE 1 TABLET BY MOUTH EVERYDAY AT BEDTIME   carbidopa-levodopa (SINEMET IR) 25-100 MG tablet 2 TABLETS AT 7 AM, 2 TABLETS AT 10 AM,2 TABLETS AT 1PM,2 TABLETS AT 4 PM /2 TABLETS AT 7 pm   cholecalciferol (VITAMIN D3) 25 MCG (1000 UNIT) tablet Take 1,000 Units by mouth daily.   clopidogrel (PLAVIX) 75 MG tablet Take 1 tablet (75 mg total) by mouth daily.   Cyanocobalamin (VITAMIN B 12 PO) Take 1,000 mcg by mouth daily.   DYMISTA 137-50 MCG/ACT SUSP Place 1 puff into both nostrils at bedtime.    escitalopram (LEXAPRO) 10 MG tablet Take 20 mg by mouth daily.   finasteride (PROSCAR) 5 MG tablet Take 5 mg by mouth every evening.   lansoprazole (PREVACID)  30 MG capsule Take 30 mg by mouth 2 (two) times daily. Morning & mid-afternoon   levocetirizine (XYZAL) 5 MG tablet Take 5 mg by mouth every evening.     metoprolol succinate (TOPROL-XL) 25 MG 24 hr tablet TAKE 1/2 TABLET BY MOUTH DAILY   mexiletine (MEXITIL) 150 MG capsule Take 1 capsule (150 mg total) by mouth 2 (two) times daily.   midodrine (PROAMATINE) 5 MG tablet TAKE 1 TABLET BY MOUTH 2 TIMES DAILY AS NEEDED. TAKE IF SYSTOLIC BLOOD PRESSURE GETS BELOW 100   montelukast (SINGULAIR) 10 MG tablet Take 10 mg by mouth at bedtime.     nitroGLYCERIN (NITROSTAT) 0.4 MG SL tablet Place 1 tablet (0.4 mg total) under the tongue every 5 (five) minutes as needed for chest pain (Do not give more than 3 SL tablets in 15 minutes.).   Omega-3 Fatty Acids (FISH OIL) 1000 MG CAPS Take 2,000 mg by mouth in the morning.   PROCTOSOL HC 2.5 % rectal cream APPLY INTO AND AROUND RECTUM 2 TIMES A DAY   rOPINIRole (REQUIP) 0.25 MG tablet 1 po tid for 1 week, then 2 po tid, then 3 tabs po tid x 1 week   rOPINIRole (REQUIP) 1 MG tablet Take 1 tablet (1 mg total) by mouth 3 (three) times daily.    rosuvastatin (CRESTOR) 20 MG tablet Take 1 tablet (20 mg total) by mouth at bedtime.   tamsulosin (FLOMAX) 0.4 MG CAPS capsule Take 0.4 mg by mouth daily after supper.    traMADol (ULTRAM) 50 MG tablet Take 50 mg by mouth every 6 (six) hours as needed. for pain   zolpidem (AMBIEN) 10 MG tablet Take 10 mg by mouth at bedtime.     Allergies:   Floxin i.v. in dextrose 5% [ofloxacin] and Terazosin   Social History   Socioeconomic History   Marital status: Married    Spouse name: [name]    Number of children: 2   Years of education: 15   Highest education level: Some college, no degree  Occupational History   Occupation: Retired    Comment: Social research officer, government  Tobacco Use   Smoking status: Never   Smokeless tobacco: Never  Building services engineer Use: Never used  Substance and Sexual Activity   Alcohol use: No    Alcohol/week: 0.0 standard drinks of alcohol   Drug use: No   Sexual activity: Not Currently  Other Topics Concern   Not on file  Social History Narrative   Lives with wife.    Caffeine use: Coffee/tea/soda- ocassionally   Right-handed.   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   Social Determinants of Corporate investment banker Strain: Not on file  Food Insecurity: No Food Insecurity (05/26/2022)   Hunger Vital Sign    Worried About Running Out of Food in the Last Year: Never true    Ran Out of Food in the Last Year: Never true  Transportation Needs: No Transportation Needs (05/26/2022)   PRAPARE - Administrator, Civil Service (Medical): No    Lack of Transportation (Non-Medical): No  Physical Activity: Not on file  Stress: Not on file  Social Connections: Not on file     Family History: The patient's family history includes Breast cancer in his paternal grandmother; Clotting disorder in his father; Healthy in his daughter and daughter; Heart attack in his father and paternal grandfather; Heart disease in his brother and father; Hypertension in his mother; Lung  cancer in his brother; Prostate cancer in his brother. There is no history of Esophageal cancer, Stomach cancer, Rectal cancer, or Colon polyps.  ROS:   Please see the history of present illness.     All other systems reviewed and are negative.  EKGs/Labs/Other Studies Reviewed:    The following studies were reviewed today: Cardiac Studies & Procedures   CARDIAC CATHETERIZATION  CARDIAC CATHETERIZATION 02/06/2021  Narrative   Prox LAD to Mid LAD lesion is 30% stenosed.   1st Diag lesion is 70% stenosed.   Mid LAD-1 lesion is 40% stenosed.   Mid LAD-2 lesion is 60% stenosed.   Dist LAD lesion is 80% stenosed.   Ramus-1 lesion is 60% stenosed.   Ramus-2 lesion is 80% stenosed.   There is moderate left ventricular systolic dysfunction.   LV end diastolic pressure is mildly elevated.   The left ventricular ejection fraction is 35-45% by visual estimate.  1.  No clear culprit is identified for non-ST elevation myocardial infarction with no significant disease affecting the main vessels.  He does have distal LAD stenosis as well as distal ramus stenosis supplying small segments and diagonal disease.  Initially, the LAD was thought to be occluded but in reality, it had separate ostium from the left circumflex and I was able to selectively engage the LAD.  2.  Moderately reduced LV systolic function 5 to 40%.  Possible distal anterior and apical hypokinesis.  Recommendations: Medical therapy with nitroglycerin drip for pain control.  Resume heparin drip 6 hours. Obtain an echocardiogram to evaluate for possible stress-induced cardiomyopathy.  Findings Coronary Findings Diagnostic  Dominance: Right  Left Anterior Descending Prox LAD to Mid LAD lesion is 30% stenosed. Mid LAD-1 lesion is 40% stenosed. Mid LAD-2 lesion is 60% stenosed. Dist LAD lesion is 80% stenosed.  First Diagonal Branch 1st Diag lesion is 70% stenosed.  Second Diagonal Branch The vessel exhibits minimal  luminal irregularities.  Ramus Intermedius Ramus-1 lesion is 60% stenosed. Ramus-2 lesion is 80% stenosed.  Left Circumflex  First Obtuse Marginal Branch The vessel exhibits minimal luminal irregularities.  Second Obtuse Marginal Branch Vessel is angiographically normal.  Third Obtuse Marginal Branch Vessel is angiographically normal.  Right Coronary Artery There is mild diffuse disease throughout the vessel.  Intervention  No interventions have been documented.   STRESS TESTS  NM MYOCAR MULTI W/SPECT W 05/28/2022  Narrative CLINICAL DATA:  Chest pain.  Intermediate CAD risk.  EXAM: MYOCARDIAL IMAGING WITH SPECT (REST AND PHARMACOLOGIC-STRESS)  GATED LEFT VENTRICULAR WALL MOTION STUDY  LEFT VENTRICULAR EJECTION FRACTION  TECHNIQUE: Standard myocardial SPECT imaging was performed after resting intravenous injection of 9.9 mCi Tc-71m tetrofosmin. Subsequently, intravenous infusion of Lexiscan was performed under the supervision of the Cardiology staff. At peak effect of the drug, 31.4 mCi Tc-43m tetrofosmin was injected intravenously and standard myocardial SPECT imaging was performed. Quantitative gated imaging was also performed to evaluate left ventricular wall motion, and estimate left ventricular ejection fraction.  COMPARISON:  None Available.  FINDINGS: Perfusion: A small fixed defect is seen in the distal inferolateral wall of the left ventricle on both stress and resting images, likely due to diaphragmatic attenuation artifact. No reversible myocardial perfusion defects are seen to suggest reversible myocardial ischemia.  Wall Motion: Normal left ventricular wall motion. No left ventricular dilation.  Left Ventricular Ejection Fraction: 48 %  End diastolic volume 92 ml  End systolic volume 47 ml  IMPRESSION: 1. No reversible ischemia. Probable diaphragmatic attenuation artifact.  2. Normal left  ventricular wall motion.  3. Left ventricular  ejection fraction 48%  4. Non invasive risk stratification*: Low  *2012 Appropriate Use Criteria for Coronary Revascularization Focused Update: J Am Coll Cardiol. 2012;59(9):857-881. http://content.dementiazones.com.aspx?articleid=1201161   Electronically Signed By: Danae Orleans M.D. On: 05/28/2022 15:11   ECHOCARDIOGRAM  ECHOCARDIOGRAM COMPLETE 05/27/2022  Narrative ECHOCARDIOGRAM REPORT    Patient Name:   Joshua [name] Date of Exam: 05/27/2022 Medical Rec #:  [id number]            Height:       73.0 in Accession #:    [id number]           Weight:       220.2 lb Date of Birth:  [birth year]           BSA:          2.241 m Patient Age:    76 years             BP:           110/68 mmHg Patient Gender: M                    HR:           82 bpm. Exam Location:  Inpatient  Procedure: 2D Echo, Color Doppler, Cardiac Doppler and Intracardiac Opacification Agent  Indications:    Chest Pain  History:        Patient has prior history of Echocardiogram examinations, most recent 02/06/2021. Previous Myocardial Infarction and CAD, OSA and Stroke, Arrythmias:RBBB; Risk Factors:Dyslipidemia and Hypertension.  Sonographer:    Milbert Coulter Referring Phys: [id number] JENNIFER YATES   Sonographer Comments: Suboptimal apical window. Image acquisition challenging due to patient body habitus. IMPRESSIONS   1. Left ventricular ejection fraction, by estimation, is 60 to 65%. The left ventricle has normal function. The left ventricle has no regional wall motion abnormalities. There is mild concentric left ventricular hypertrophy. Left ventricular diastolic parameters are consistent with Grade I diastolic dysfunction (impaired relaxation). 2. Right ventricular systolic function is normal. The right ventricular size is not well visualized. 3. The mitral valve was not well visualized. No evidence of mitral valve regurgitation. 4. The aortic valve was not well visualized. Aortic valve  regurgitation is not visualized. No aortic stenosis is present.  Comparison(s): No significant change from prior study. This study is technically more challenging from prior.  FINDINGS Left Ventricle: Left ventricular ejection fraction, by estimation, is 60 to 65%. The left ventricle has normal function. The left ventricle has no regional wall motion abnormalities. Definity contrast agent was given IV to delineate the left ventricular endocardial borders. The left ventricular internal cavity size was normal in size. There is mild concentric left ventricular hypertrophy. Left ventricular diastolic parameters are consistent with Grade I diastolic dysfunction (impaired relaxation).  Right Ventricle: The right ventricular size is not well visualized. Right vetricular wall thickness was not well visualized. Right ventricular systolic function is normal.  Left Atrium: Left atrial size was not well visualized.  Right Atrium: Right atrial size was not well visualized.  Pericardium: There is no evidence of pericardial effusion.  Mitral Valve: The mitral valve was not well visualized. No evidence of mitral valve regurgitation.  Tricuspid Valve: The tricuspid valve is not well visualized. Tricuspid valve regurgitation is not demonstrated.  Aortic Valve: The aortic valve was not well visualized. Aortic valve regurgitation is not visualized. No aortic stenosis is present. Aortic valve mean gradient measures 4.0 mmHg. Aortic valve peak  gradient measures 7.4 mmHg. Aortic valve area, by VTI measures 2.65 cm.  Pulmonic Valve: The pulmonic valve was not well visualized. Pulmonic valve regurgitation is not visualized.  Aorta: The aortic root is normal in size and structure and the ascending aorta was not well visualized.  IAS/Shunts: The interatrial septum was not well visualized.   LEFT VENTRICLE PLAX 2D LVIDd:         4.50 cm   Diastology LVIDs:         3.30 cm   LV e' medial:    5.98 cm/s LV PW:          1.30 cm   LV E/e' medial:  7.1 LV IVS:        1.20 cm   LV e' lateral:   6.64 cm/s LVOT diam:     2.10 cm   LV E/e' lateral: 6.4 LV SV:         62 LV SV Index:   28 LVOT Area:     3.46 cm   RIGHT VENTRICLE RV S prime:     13.40 cm/s TAPSE (M-mode): 2.0 cm  LEFT ATRIUM             Index LA diam:        3.00 cm 1.34 cm/m LA Vol (A2C):   49.7 ml 22.17 ml/m LA Vol (A4C):   43.6 ml 19.45 ml/m LA Biplane Vol: 50.3 ml 22.44 ml/m AORTIC VALVE AV Area (Vmax):    2.65 cm AV Area (Vmean):   2.52 cm AV Area (VTI):     2.65 cm AV Vmax:           136.00 cm/s AV Vmean:          92.400 cm/s AV VTI:            0.233 m AV Peak Grad:      7.4 mmHg AV Mean Grad:      4.0 mmHg LVOT Vmax:         104.00 cm/s LVOT Vmean:        67.100 cm/s LVOT VTI:          0.178 m LVOT/AV VTI ratio: 0.76  AORTA Ao Root diam: 3.00 cm  MITRAL VALVE MV Area (PHT): 4.63 cm    SHUNTS MV Decel Time: 164 msec    Systemic VTI:  0.18 m MV E velocity: 42.40 cm/s  Systemic Diam: 2.10 cm MV A velocity: 83.80 cm/s MV E/A ratio:  0.51  Riley Lam MD Electronically signed by Riley Lam MD Signature Date/Time: 05/27/2022/4:23:01 PM    Final   TEE  ECHO TEE 04/04/2015  Narrative *[place]* *[facility]* [address] [place], [place] [address] [phone]  ------------------------------------------------------------------- Transesophageal Echocardiography  Patient:    Trelyn, Ciaburri MR #:       [id number] Study Date: 04/04/2015 Gender:     M Age:        83 Height:     185.4 cm Weight:     98.2 kg BSA:        2.27 m^2 Pt. Status: Room:  ATTENDING    Olga Millers PERFORMING   Olga Millers SONOGRAPHER  Perley Jain, RDCS ORDERING     Sharol Harness, [place] M  cc:  ------------------------------------------------------------------- LV EF: 50% -   55%  ------------------------------------------------------------------- Indications:       CVA 436.  ------------------------------------------------------------------- Study Conclusions  - Left ventricle: Systolic function was normal. The estimated ejection fraction was in the  range of 50% to 55%. Wall motion was normal; there were no regional wall motion abnormalities. - Aortic valve: No evidence of vegetation. There was trivial regurgitation. - Mitral valve: No evidence of vegetation. There was mild regurgitation. - Left atrium: The atrium was mildly dilated. No evidence of thrombus in the atrial cavity or appendage. - Right atrium: No evidence of thrombus in the atrial cavity or appendage. - Atrial septum: There was a patent foramen ovale. - Tricuspid valve: No evidence of vegetation. - Pulmonic valve: No evidence of vegetation.  Impressions:  - Normal LV systolic function; mild LAE; sclerotic aortic valve with small mobile density likey lambl&'s excrescence; trace AI; mild MR; patent foramen ovale noted.  Diagnostic transesophageal echocardiography.  2D and color Doppler. Birthdate:  Patient birthdate: 01/07/1946.  Age:  Patient is 77 yr old.  Sex:  Gender: male.    BMI: 28.6 kg/m^2.  Blood pressure: 123/87  Patient status:  Outpatient.  Study date:  Study date: 04/04/2015. Study time: 08:00 AM.  Location:  Endoscopy.  -------------------------------------------------------------------  ------------------------------------------------------------------- Left ventricle:  Systolic function was normal. The estimated ejection fraction was in the range of 50% to 55%. Wall motion was normal; there were no regional wall motion abnormalities.  ------------------------------------------------------------------- Aortic valve:   Mildly thickened leaflets. Cusp separation was normal.  No evidence of vegetation.  Doppler:  There was trivial regurgitation.  ------------------------------------------------------------------- Aorta:  Descending aorta: The descending aorta  had mild diffuse disease.  ------------------------------------------------------------------- Mitral valve:   Structurally normal valve.   Leaflet separation was normal.  No evidence of vegetation.  Doppler:  There was mild regurgitation.  ------------------------------------------------------------------- Left atrium:  The atrium was mildly dilated.  No evidence of thrombus in the atrial cavity or appendage.  ------------------------------------------------------------------- Atrial septum:  There was a patent foramen ovale.  ------------------------------------------------------------------- Right ventricle:  The cavity size was normal. Systolic function was normal.  ------------------------------------------------------------------- Pulmonic valve:    Structurally normal valve.   Cusp separation was normal.  No evidence of vegetation.  Doppler:  There was trivial regurgitation.  ------------------------------------------------------------------- Tricuspid valve:   Structurally normal valve.   Leaflet separation was normal.  No evidence of vegetation.  Doppler:  There was trivial regurgitation.  ------------------------------------------------------------------- Right atrium:  The atrium was normal in size.  No evidence of thrombus in the atrial cavity or appendage.  ------------------------------------------------------------------- Pericardium:  There was no pericardial effusion.  ------------------------------------------------------------------- Prepared and Electronically Authenticated by  Olga Millers 2017-03-23T14:45:13             EKG:  EKG is not ordered today.  The ekg ordered 5/14 showed sinus tachycardia, HR 101 BPM, RBBB   Recent Labs: 05/26/2022: B Natriuretic Peptide 33.4; TSH 0.893 05/31/2022: ALT 7; BUN 7; Creatinine, Ser 0.74; Hemoglobin 12.7; Platelets 193; Potassium 4.5; Sodium 135  Recent Lipid Panel    Component Value Date/Time   CHOL 85  05/26/2022 1831   TRIG 80 05/26/2022 1831   HDL 46 05/26/2022 1831   CHOLHDL 1.8 05/26/2022 1831   VLDL 16 05/26/2022 1831   LDLCALC 23 05/26/2022 1831     Risk Assessment/Calculations:                Physical Exam:    VS:  BP 124/72   Pulse 71   Ht 6\' 1"  (1.854 m)   Wt 221 lb 9.6 oz (100.5 kg)   SpO2 95%   BMI 29.24 kg/m     Wt Readings from Last 3 Encounters:  06/10/22 221 lb 9.6 oz (  100.5 kg)  05/31/22 220 lb 9.6 oz (100.1 kg)  05/21/22 226 lb 12.8 oz (102.9 kg)     GEN:  Well nourished, well developed in no acute distress HEENT: Normal NECK: No JVD; No carotid bruits CARDIAC: RRR, no murmurs, rubs, gallops. Radial pulses 2+ bilaterally  RESPIRATORY:  Clear to auscultation without rales, wheezing or rhonchi  ABDOMEN: Soft, non-tender, non-distended MUSCULOSKELETAL:  No edema; No deformity  SKIN: Warm and dry NEUROLOGIC:  Alert and oriented x 3 PSYCHIATRIC:  Normal affect   ASSESSMENT:    1. CAD in native artery   2. PVC's (premature ventricular contractions)   3. Essential hypertension   4. RBBB    PLAN:    In order of problems listed above:  CAD  - Patient previously had an NSTEMI and left heart cath on 02/05/21 that showed diffuse moderate disease in the LAD and RI. There were no targets for intervention and patient has been managed medically  - Nuclear stress test from 05/28/22 showed no reversible ischemia. Was overall a low risk study  - He has not had recurrence of chest pain since being discharged from the hospital  - Since his NSTEMI in 01/2021, patient has been on both ASA and plavix. Discussed with Dr. Anne [name]- as patient is over a year out from NSTEMI, can stop ASA and continue plavix monotherapy. (Elected to continue Plavix as he was previously changed from ASA to Plavix due to CVA 2017) - Continue low dose metoprolol succinate 12.5 mg daily  - Continue crestor 20 mg daily   PVCs  - Patient previously underwent unsuccessful ablation in 2017  with only intermittent suppression of PVCs - Continue mexiletine 150 mg BID  - Continue metoprolol succinate 12.5 mg daily  - K was 4.1 at recent PCP visit on 5/22 - continue to follow with EP  HLD  - Lipid panel from 05/26/22 showed LDL 23, HDL 46, triglycerides 80, total cholesterol 85 - Continue crestor 20 mg daily   Autonomic Dysfunction Parkinson Disease  - Patient is at high risk of orthostatic hypotension (autonomic dysfunction). He has been on Midodrine 5 mg PRN for SBP lower than 100. He usually takes 1 dose of midodrine every morning. After taking midodrine, his BP is usually in the 120s/70s. Denies systolic BP greater than 135  - Denies dizziness, lightheadedness - Continue PRN midodrine    Medication Adjustments/Labs and Tests Ordered: Current medicines are reviewed at length with the patient today.  Concerns regarding medicines are outlined above.  No orders of the defined types were placed in this encounter.  No orders of the defined types were placed in this encounter.   Patient Instructions  Medication Instructions:  Your physician recommends that you continue on your current medications as directed. Please refer to the Current Medication list given to you today.  *If you need a refill on your cardiac medications before your next appointment, please call your pharmacy*  Lab Work: None ordered today.  Testing/Procedures: None ordered today.  Follow-Up: At [facility], you and your health needs are our priority.  As part of our continuing mission to provide you with exceptional heart care, we have created designated Provider Care Teams.  These Care Teams include your primary Cardiologist (physician) and Advanced Practice Providers (APPs -  Physician Assistants and Nurse Practitioners) who all work together to provide you with the care you need, when you need it.  Your next appointment:   6 month(s)  The format for your next appointment:  In  Person  Provider:   Donato Schultz, MD  or Jari Favre, PA-C, Ronie Spies, PA-C, Robin Searing, NP, Eligha Bridegroom, NP, or Tereso Newcomer, PA-C    Signed, Jonita Albee, PA-C  06/10/2022 2:27 PM    [facility]

## 2022-06-18 ENCOUNTER — Other Ambulatory Visit: Payer: Self-pay | Admitting: Neurology

## 2022-06-18 DIAGNOSIS — G20A1 Parkinson's disease without dyskinesia, without mention of fluctuations: Secondary | ICD-10-CM

## 2022-06-22 ENCOUNTER — Ambulatory Visit: Payer: Medicare HMO | Attending: Cardiology | Admitting: Cardiology

## 2022-06-22 ENCOUNTER — Encounter: Payer: Self-pay | Admitting: Cardiology

## 2022-06-22 DIAGNOSIS — I493 Ventricular premature depolarization: Secondary | ICD-10-CM

## 2022-06-22 DIAGNOSIS — I10 Essential (primary) hypertension: Secondary | ICD-10-CM | POA: Diagnosis not present

## 2022-06-22 DIAGNOSIS — I2510 Atherosclerotic heart disease of native coronary artery without angina pectoris: Secondary | ICD-10-CM | POA: Diagnosis not present

## 2022-06-22 NOTE — Patient Instructions (Addendum)
Medication Instructions:  Your physician recommends that you continue on your current medications as directed. Please refer to the Current Medication list given to you today.  *If you need a refill on your cardiac medications before your next appointment, please call your pharmacy*   Lab Work: None ordered   Testing/Procedures: None ordered   Follow-Up: At [facility], you and your health needs are our priority.  As part of our continuing mission to provide you with exceptional heart care, we have created designated Provider Care Teams.  These Care Teams include your primary Cardiologist (physician) and Advanced Practice Providers (APPs -  Physician Assistants and Nurse Practitioners) who all work together to provide you with the care you need, when you need it.  Your next appointment:   12 month(s)  The format for your next appointment:   In Person  Provider:  Dr. Elberta Fortis  {  Thank you for choosing [facility]!!   Dory Horn, RN [phone]

## 2022-06-22 NOTE — Progress Notes (Signed)
Electrophysiology Office Note   Date:  06/22/2022   ID:  Joshua [name], Joshua [name] 1945-07-05, MRN [id number]  PCP:  [name], MD  Primary Electrophysiologist:  Regan Lemming, MD    No chief complaint on file.    History of Present Illness: Joshua [name] is a 77 y.o. male who presents today for electrophysiology evaluation.     He has a history significant for CVA, OSA, hyperlipidemia, GERD, hypertension, right bundle branch block.  Stroke February 2017.  TEE showed a PFO and a vegetation on aortic valve thought due to Lambl excrescence.  He had a Linq monitor implanted and was found to have a 26% PVC burden.  He had an ablation for his PVCs but they recurred and he was started on flecainide.  He was found to have coronary artery disease after presented to the hospital with non-STEMI.  Flecainide was stopped and he was started on mexiletine.  Catheterization showed diffuse moderate level disease but no culprit lesion was identified.  Since then he has had issues with mexiletine and has been stopped.  Today, denies symptoms of palpitations, chest pain, shortness of breath, orthopnea, PND, lower extremity edema, claudication, dizziness, presyncope, syncope, bleeding, or neurologic sequela. The patient is tolerating medications without difficulties.  Today he feels well.  He does have mild fatigue.  He attributes this to his Parkinson's.  He had a recent admission to the hospital with small bowel obstruction.  He did not require surgery.   Past Medical History:  Diagnosis Date   Allergic rhinitis    Anxiety    from chronic pain from surgery- on Cymbalta   Arthritis    Asthma    Barrett's esophagus 03/29/2014   Carotid artery disease (HCC)    Carotid Doppler normal August, 2007   Diverticulosis    Dyslipidemia    GERD (gastroesophageal reflux disease)    History of loop recorder    has since 04/04/15   HTN (hypertension)    takes Metoprolol for PVC control    Hx of colonic polyps    adenomatous   IFG (impaired fasting glucose)    Palpitations    Benign PVCs   Parkinson disease    Prostate cancer (HCC)    RBBB (right bundle branch block)    rate related   Shingles    TIA (transient ischemic attack) 02/2015   Per pt, had 2 strokes   Vertigo    Past Surgical History:  Procedure Laterality Date   BACK SURGERY  2002,2009   x 6   CHOLECYSTECTOMY  11/30/2012   with IOC   COLONOSCOPY     ELECTROPHYSIOLOGIC STUDY N/A 09/26/2015   Procedure: V Tach Ablation (PVC);  Surgeon: Allex Lapoint Jorja Loa, MD;  Location: [facility];  Service: Cardiovascular;  Laterality: N/A;   EP IMPLANTABLE DEVICE N/A 04/04/2015   Procedure: Loop Recorder Insertion;  Surgeon: Nasia Cannan Jorja Loa, MD;  Location: [facility];  Service: Cardiovascular;  Laterality: N/A;   HERNIA REPAIR     laprascopic   KNEE SURGERY     DECEMBER 2017,LEFT KNEE SCOPED   LEFT HEART CATH AND CORONARY ANGIOGRAPHY N/A 02/05/2021   Procedure: LEFT HEART CATH AND CORONARY ANGIOGRAPHY;  Surgeon: Iran Ouch, MD;  Location: [facility];  Service: Cardiovascular;  Laterality: N/A;   NECK SURGERY  2002   POLYPECTOMY     ROTATOR CUFF REPAIR Left    TEE WITHOUT CARDIOVERSION N/A 04/04/2015  Procedure: TRANSESOPHAGEAL ECHOCARDIOGRAM (TEE);  Surgeon: Lewayne Bunting, MD;  Location: [facility];  Service: Cardiovascular;  Laterality: N/A;   TRIGGER FINGER RELEASE Left 12/28/2019   Procedure: RELEASE TRIGGER FINGER/A-1 PULLEY THUMB, MIDDLE AND RING;  Surgeon: Cindee Salt, MD;  Location: [facility];  Service: Orthopedics;  Laterality: Left;  FAB   UPPER GASTROINTESTINAL ENDOSCOPY     V Tach ablation  09/26/2015     Current Outpatient Medications  Medication Sig Dispense Refill   acetaminophen (TYLENOL) 500 MG tablet Take 1,000 mg by mouth in the morning and at bedtime.     AMBULATORY NON FORMULARY MEDICATION Dispense 1 electric scooter DX G20.A 1 (Patient  not taking: Reported on 05/26/2022) 1 Units 0   ASPIRIN LOW DOSE 81 MG tablet TAKE 1 TABLET BY MOUTH EVERY DAY (Patient not taking: Reported on 06/22/2022) 90 tablet 3   Biotin 96045 MCG TABS Take 1,000 mcg by mouth in the morning.     carbidopa-levodopa (SINEMET CR) 50-200 MG tablet TAKE 1 TABLET BY MOUTH EVERYDAY AT BEDTIME 90 tablet 0   carbidopa-levodopa (SINEMET IR) 25-100 MG tablet 2 TABLETS AT 7 AM, 2 TABLETS AT 10 AM,2 TABLETS AT 1PM,2 TABLETS AT 4 PM /2 TABLETS AT 7 pm 900 tablet 0   cholecalciferol (VITAMIN D3) 25 MCG (1000 UNIT) tablet Take 1,000 Units by mouth daily.     clopidogrel (PLAVIX) 75 MG tablet Take 1 tablet (75 mg total) by mouth daily. 30 tablet 2   Cyanocobalamin (VITAMIN B 12 PO) Take 1,000 mcg by mouth daily.     DYMISTA 137-50 MCG/ACT SUSP Place 1 puff into both nostrils at bedtime.      escitalopram (LEXAPRO) 10 MG tablet Take 20 mg by mouth daily.     finasteride (PROSCAR) 5 MG tablet Take 5 mg by mouth every evening.     lansoprazole (PREVACID) 30 MG capsule Take 30 mg by mouth 2 (two) times daily. Morning & mid-afternoon     levocetirizine (XYZAL) 5 MG tablet Take 5 mg by mouth every evening.       metoprolol succinate (TOPROL-XL) 25 MG 24 hr tablet TAKE 1/2 TABLET BY MOUTH DAILY 45 tablet 2   mexiletine (MEXITIL) 150 MG capsule Take 1 capsule (150 mg total) by mouth 2 (two) times daily. 180 capsule 3   midodrine (PROAMATINE) 5 MG tablet TAKE 1 TABLET BY MOUTH 2 TIMES DAILY AS NEEDED. TAKE IF SYSTOLIC BLOOD PRESSURE GETS BELOW 100 180 tablet 3   montelukast (SINGULAIR) 10 MG tablet Take 10 mg by mouth at bedtime.       nitroGLYCERIN (NITROSTAT) 0.4 MG SL tablet Place 1 tablet (0.4 mg total) under the tongue every 5 (five) minutes as needed for chest pain (Do not give more than 3 SL tablets in 15 minutes.). 25 tablet 2   Omega-3 Fatty Acids (FISH OIL) 1000 MG CAPS Take 2,000 mg by mouth in the morning.     PROCTOSOL HC 2.5 % rectal cream APPLY INTO AND AROUND RECTUM 2  TIMES A DAY 28.35 g 0   rOPINIRole (REQUIP) 0.25 MG tablet 1 po tid for 1 week, then 2 po tid, then 3 tabs po tid x 1 week 126 tablet 0   rOPINIRole (REQUIP) 1 MG tablet Take 1 tablet (1 mg total) by mouth 3 (three) times daily. 270 tablet 1   rosuvastatin (CRESTOR) 20 MG tablet Take 1 tablet (20 mg total) by mouth at bedtime. 90 tablet 0   tamsulosin (FLOMAX) 0.4  MG CAPS capsule Take 0.4 mg by mouth daily after supper.      traMADol (ULTRAM) 50 MG tablet Take 50 mg by mouth every 6 (six) hours as needed. for pain  0   zolpidem (AMBIEN) 10 MG tablet Take 10 mg by mouth at bedtime.     No current facility-administered medications for this visit.    Allergies:   Floxin i.v. in dextrose 5% [ofloxacin] and Terazosin   Social History:  The patient  reports that he has never smoked. He has never used smokeless tobacco. He reports that he does not drink alcohol and does not use drugs.   Family History:  The patient's family history includes Breast cancer in his paternal grandmother; Clotting disorder in his father; Healthy in his daughter and daughter; Heart attack in his father and paternal grandfather; Heart disease in his brother and father; Hypertension in his mother; Lung cancer in his brother; Prostate cancer in his brother.   ROS:  Please see the history of present illness.   Otherwise, review of systems is positive for none.   All other systems are reviewed and negative.   PHYSICAL EXAM: VS:  There were no vitals taken for this visit. , BMI There is no height or weight on file to calculate BMI. GEN: Well nourished, well developed, in no acute distress  HEENT: normal  Neck: no JVD, carotid bruits, or masses Cardiac: RRR; no murmurs, rubs, or gallops,no edema  Respiratory:  clear to auscultation bilaterally, normal work of breathing GI: soft, nontender, nondistended, + BS MS: no deformity or atrophy  Skin: warm and dry Neuro:  Strength and sensation are intact Psych: euthymic mood, full  affect  EKG:  EKG is not ordered today. Personal review of the ekg ordered 05/26/22 shows sinus tachycardia, right bundle branch block, left anterior fascicular block  Recent Labs: 05/26/2022: B Natriuretic Peptide 33.4; TSH 0.893 05/31/2022: ALT 7; BUN 7; Creatinine, Ser 0.74; Hemoglobin 12.7; Platelets 193; Potassium 4.5; Sodium 135    Lipid Panel     Component Value Date/Time   CHOL 85 05/26/2022 1831   TRIG 80 05/26/2022 1831   HDL 46 05/26/2022 1831   CHOLHDL 1.8 05/26/2022 1831   VLDL 16 05/26/2022 1831   LDLCALC 23 05/26/2022 1831     Wt Readings from Last 3 Encounters:  06/10/22 221 lb 9.6 oz (100.5 kg)  05/31/22 220 lb 9.6 oz (100.1 kg)  05/21/22 226 lb 12.8 oz (102.9 kg)      Other studies Reviewed: Additional studies/ records that were reviewed today include: LHC 02/06/21 Review of the above records today demonstrates:    Prox LAD to Mid LAD lesion is 30% stenosed.   1st Diag lesion is 70% stenosed.   Mid LAD-1 lesion is 40% stenosed.   Mid LAD-2 lesion is 60% stenosed.   Dist LAD lesion is 80% stenosed.   Ramus-1 lesion is 60% stenosed.   Ramus-2 lesion is 80% stenosed.   There is moderate left ventricular systolic dysfunction.   LV end diastolic pressure is mildly elevated.   The left ventricular ejection fraction is 35-45% by visual estimate.  TTE 02/06/21  1. Left ventricular ejection fraction, by estimation, is 60 to 65%. The  left ventricle has normal function. The left ventricle has no regional  wall motion abnormalities. There is mild concentric left ventricular  hypertrophy. Left ventricular diastolic  parameters are consistent with Grade I diastolic dysfunction (impaired  relaxation).   2. Right ventricular systolic function is normal.  The right ventricular  size is normal. Tricuspid regurgitation signal is inadequate for assessing  PA pressure.   3. The mitral valve is normal in structure. Trivial mitral valve  regurgitation. No evidence of  mitral stenosis.   4. The aortic valve is tricuspid. Aortic valve regurgitation is not  visualized. Aortic valve sclerosis is present, with no evidence of aortic  valve stenosis.   5. Aortic dilatation noted. There is borderline dilatation of the aortic  root, measuring 37 mm. There is mild dilatation of the ascending aorta,  measuring 38 mm.   6. The inferior vena cava is normal in size with greater than 50%  respiratory variability, suggesting right atrial pressure of 3 mmHg.   ASSESSMENT AND PLAN:  1.  PVCs: Status post ablation with intermittent suppression.  Flecainide has been stopped and he has been started on mexiletine.  Minimal PVCs.  Continue with current management.  2.  Hypertension: Currently well-controlled  3.  Cryptogenic stroke: Status post Linq monitor though has been explanted.  Management per neurology.  4.  Coronary artery disease: Status post non-STEMI.  Has moderate level disease.  Continue aspirin and statin per primary cardiology   Current medicines are reviewed at length with the patient today.   The patient does not have concerns regarding his medicines.  The following changes were made today:   Labs/ tests ordered today include:  No orders of the defined types were placed in this encounter.    Disposition:   FU 12 months   Signed, Jawanza Zambito Jorja Loa, MD  06/22/2022 11:36 AM     [facility] [address] [place] [address] [phone] (office)

## 2022-08-10 ENCOUNTER — Other Ambulatory Visit: Payer: Self-pay | Admitting: Cardiology

## 2022-08-13 ENCOUNTER — Telehealth: Payer: Self-pay | Admitting: *Deleted

## 2022-08-13 NOTE — Telephone Encounter (Signed)
   Pre-operative Risk Assessment    Patient Name: Joshua [name]  DOB: [birth year] MRN: [id number]      Request for Surgical Clearance    Procedure:   LEFT TOTAL KNEE REPLACEMENT  Date of Surgery:  Clearance TBD                                 Surgeon:  DR. DANIEL MARCHWIANY Surgeon's Group or Practice Name:  [facility] Phone number:  [phone] EXT [phone] ATTN: KELLY HIGH Fax number:  [phone]   Type of Clearance Requested:   - Medical ; PLAVIX AND ASA   Type of Anesthesia:  Spinal   Additional requests/questions:    [name]   08/13/2022, 12:14 PM

## 2022-08-13 NOTE — Telephone Encounter (Signed)
Dr. Elberta Fortis,  You saw this patient on 06/22/2022. Will you please comment on medical clearance for left total knee replacement?  Please route your response to P CV DIV Preop. I will communicate with requesting office once you have given recommendations.   Thank you!  Carlos Levering, NP

## 2022-08-14 NOTE — Progress Notes (Unsigned)
Assessment/Plan:   1.  Parkinsons Disease, possibly ET/PD  -Continue carbidopa/levodopa 25/100,2 tablets at 7 AM/2 tablets at 10 AM/2 at 1pm/2 at 4pm/2 at 7pm  -Continue carbidopa/levodopa 50/200 at bedtime  -Continue requip 1 mg tid.  Patient is markedly improved in terms of decreased rigidity on this medication.  -May repeat levodopa challenge in the future, especially if we consider surgical interventions.  He and I discussed this in detail today.  We did this several years ago, but at that point in time he only had tremor (and he has levodopa resistant tremor).  He has since developed more bradykinesia and rigidity.  -Patient had skin biopsy done for alpha-synuclein in January, 2024 which did demonstrate evidence of alpha-synuclein in all 3 sites.    -Patient stated that his orthopedic surgeon told him to ask about DBS surgery, stating that it would potentially help his balance.  Discussed DBS surgery in detail with the patient.  Told him that it would not help balance and is not a treatment for balance.  It certainly could help rigidity and tremor, but today he is not rigid on medication.  I do not recommend it right now for him given the degree of tremor that he has.  He does not disagree.     2.  History of watershed infarct  -Had his linq recorder explanted after it stopped working and never showed any issues  -On Plavix  3.  History of multiple syncopal episodes, likely due to [facility]  -Off of propranolol and no further syncope.  cardiology has added metoprolol ever since his NSTEMI.    -continue midodrine 5 mg bid.  He is generally only taking it once per day and feels that he is doing okay.  Told him to make sure he is checking it before surgery.  4.  Low back pain  -Has had surgical interventions in the past.  Has followed with Dr. Murray Hodgkins and Dr. Wynetta Emery  5.  NSTEMI, 01/2020  -He was started on metoprolol at that time, although his Imdur was stopped because of hypotension.  6.   Constipation  -Patient is following with gastroenterology, Dr. Marina Goodell.  He also has gastroenterology at [facility] because of his history of Barrett's esophagus, now status post endoscopic ablation.  7.  Knee pain  -discussed surgical implications.  It is planned to do it under spinal/epidural per form sent me today.    -Told him to take Parkinson's medicines the morning of surgery.  -He is optimized from a Parkinson's standpoint.  His forms were filled out today. Subjective:   Joshua [name] was seen today in follow up for Parkinsons disease.  My previous records were reviewed prior to todays visit as well as outside records available to me.  We cautiously started ropinirole last visit.  He is currently on 1 mg 3 times per day.  He has no compulsive behaviors or sleep attacks on the medication.  He notes no problems with the medication but he isn't sure its been helpful.  More trouble with fine motor movements.  He does note more tremor.  He was in the emergency room in May with chest pain and small bowel obstruction.  He had mild hypokalemia and hyponatremia.  Fortunately, he did not need surgery.  Current prescribed movement disorder medications: Carbidopa/levodopa 25/100,2 tablets at 7 AM/2 tablets at 10 AM/2 at 1pm/2 at 4pm/2 at 7pm (an increase) Carbidopa/levodopa 50/200 at bedtime Requip 1 mg tid (started last visit) Midodrine 5 mg bid (  he has only been taking it once per day but states that it doesn't run low at home)   PREVIOUS MEDICATIONS: Pramipexole (just was not helpful for tremor); primidone; propranolol; metoprolol  ALLERGIES:   Allergies  Allergen Reactions   Floxin I.V. In Dextrose 5% [Ofloxacin] Other (See Comments)    Lowers BP Lowers BP   Terazosin Other (See Comments)    Lower bp    CURRENT MEDICATIONS:  Outpatient Encounter Medications as of 08/17/2022  Medication Sig   acetaminophen (TYLENOL) 500 MG tablet Take 1,000 mg by mouth in the morning and at bedtime.    Biotin 60454 MCG TABS Take 1,000 mcg by mouth in the morning.   carbidopa-levodopa (SINEMET CR) 50-200 MG tablet TAKE 1 TABLET BY MOUTH EVERYDAY AT BEDTIME   carbidopa-levodopa (SINEMET IR) 25-100 MG tablet 2 TABLETS AT 7 AM, 2 TABLETS AT 10 AM,2 TABLETS AT 1PM,2 TABLETS AT 4 PM /2 TABLETS AT 7 pm   cholecalciferol (VITAMIN D3) 25 MCG (1000 UNIT) tablet Take 1,000 Units by mouth daily.   clopidogrel (PLAVIX) 75 MG tablet Take 1 tablet (75 mg total) by mouth daily.   Cyanocobalamin (VITAMIN B 12 PO) Take 1,000 mcg by mouth daily.   DYMISTA 137-50 MCG/ACT SUSP Place 1 puff into both nostrils at bedtime.    escitalopram (LEXAPRO) 10 MG tablet Take 20 mg by mouth daily.   finasteride (PROSCAR) 5 MG tablet Take 5 mg by mouth every evening.   lansoprazole (PREVACID) 30 MG capsule Take 30 mg by mouth 2 (two) times daily. Morning & mid-afternoon   levocetirizine (XYZAL) 5 MG tablet Take 5 mg by mouth every evening.     metoprolol succinate (TOPROL-XL) 25 MG 24 hr tablet TAKE 1/2 TABLET BY MOUTH DAILY   mexiletine (MEXITIL) 150 MG capsule TAKE 1 CAPSULE BY MOUTH TWICE A DAY   midodrine (PROAMATINE) 5 MG tablet TAKE 1 TABLET BY MOUTH 2 TIMES DAILY AS NEEDED. TAKE IF SYSTOLIC BLOOD PRESSURE GETS BELOW 100   montelukast (SINGULAIR) 10 MG tablet Take 10 mg by mouth at bedtime.     nitroGLYCERIN (NITROSTAT) 0.4 MG SL tablet Place 1 tablet (0.4 mg total) under the tongue every 5 (five) minutes as needed for chest pain (Do not give more than 3 SL tablets in 15 minutes.).   Omega-3 Fatty Acids (FISH OIL) 1000 MG CAPS Take 2,000 mg by mouth in the morning.   PROCTOSOL HC 2.5 % rectal cream APPLY INTO AND AROUND RECTUM 2 TIMES A DAY   rOPINIRole (REQUIP) 0.25 MG tablet 1 po tid for 1 week, then 2 po tid, then 3 tabs po tid x 1 week   rOPINIRole (REQUIP) 1 MG tablet Take 1 tablet (1 mg total) by mouth 3 (three) times daily.   rosuvastatin (CRESTOR) 20 MG tablet Take 1 tablet (20 mg total) by mouth at bedtime.    tamsulosin (FLOMAX) 0.4 MG CAPS capsule Take 0.4 mg by mouth daily after supper.    traMADol (ULTRAM) 50 MG tablet Take 50 mg by mouth every 6 (six) hours as needed. for pain   zolpidem (AMBIEN) 10 MG tablet Take 10 mg by mouth at bedtime.   [DISCONTINUED] AMBULATORY NON FORMULARY MEDICATION Dispense 1 electric scooter DX G20.A 1 (Patient not taking: Reported on 05/26/2022)   [DISCONTINUED] ASPIRIN LOW DOSE 81 MG tablet TAKE 1 TABLET BY MOUTH EVERY DAY (Patient not taking: Reported on 06/22/2022)   No facility-administered encounter medications on file as of 08/17/2022.    Objective:  PHYSICAL EXAMINATION:    VITALS:   Vitals:   08/17/22 1401  BP: 110/60  Pulse: 83  SpO2: 96%  Weight: 225 lb (102.1 kg)  Height: 6\' 1"  (1.854 m)     GEN:  The patient appears stated age and is in NAD. HEENT:  Normocephalic, atraumatic.  The mucous membranes are moist. The superficial temporal arteries are without ropiness or tenderness. CV:  RRR Lungs:  CTAB Neck/HEME:  There are no carotid bruits bilaterally.  Neurological examination:  Orientation: The patient is alert and oriented x3. Cranial nerves: There is good facial symmetry with facial hypomimia. The speech is fluent and clear. Soft palate rises symmetrically and there is no tongue deviation. Hearing is intact to conversational tone. Sensation: Sensation is intact to light touch throughout Motor: Strength is at least antigravity x4.  Movement examination: Tone: There is nl tone in the UE/LE (marked improvement) Abnormal movements: there is R>LUE rest tremor, overally mild Coordination:  There is no decremation, with any form of RAMS, including alternating supination and pronation of the forearm, hand opening and closing, finger taps, heel taps and toe taps bilaterally (improved) Gait and Station: The patient pushes off to arise.  The patient is forward flexed with decreased arm swing and he slightly drags the R leg.  He is a bit short  stepped.  He is slightly antalgic.  I have reviewed and interpreted the following labs independently    Chemistry      Component Value Date/Time   NA 135 05/31/2022 0056   NA 136 02/14/2021 1528   K 4.5 05/31/2022 0056   CL 102 05/31/2022 0056   CO2 25 05/31/2022 0056   BUN 7 (L) 05/31/2022 0056   BUN 20 02/14/2021 1528   CREATININE 0.74 05/31/2022 0056   CREATININE 0.88 09/18/2015 1135      Component Value Date/Time   CALCIUM 8.8 (L) 05/31/2022 0056   ALKPHOS 44 05/31/2022 0056   AST 23 05/31/2022 0056   ALT 7 05/31/2022 0056   BILITOT 1.5 (H) 05/31/2022 0056   BILITOT 0.7 04/04/2015 1501       Lab Results  Component Value Date   WBC 6.5 05/31/2022   HGB 12.7 (L) 05/31/2022   HCT 37.5 (L) 05/31/2022   MCV 94.7 05/31/2022   PLT 193 05/31/2022    Lab Results  Component Value Date   TSH 0.893 05/26/2022     Total time spent on today's visit was 30 minutes, including both face-to-face time and nonface-to-face time.  Time included that spent on review of records (prior notes available to me/labs/imaging if pertinent), discussing treatment and goals, answering patient's questions and coordinating care.  Cc:  [name], MD

## 2022-08-17 ENCOUNTER — Ambulatory Visit: Payer: Medicare HMO | Admitting: Neurology

## 2022-08-17 ENCOUNTER — Encounter: Payer: Self-pay | Admitting: Neurology

## 2022-08-17 VITALS — BP 110/60 | HR 83 | Ht 73.0 in | Wt 225.0 lb

## 2022-08-17 DIAGNOSIS — G20A1 Parkinson's disease without dyskinesia, without mention of fluctuations: Secondary | ICD-10-CM | POA: Diagnosis not present

## 2022-08-17 NOTE — Patient Instructions (Signed)
Regarding upcoming surgery, I recommend:  -take your parkinsons medication the AM of surgery  -if medication for nausea is needed after surgery, I recommend zofran instead of phenergan as phenergan can make parkinsons worse  -asking your surgeon if it is possible if the surgery can be done under a local block or spinal anesthesia.  If not then general anesthesia is fine  SAVE THE DATE!  We are planning a Parkinsons Disease educational symposium at [facility] in [place] on October 11.  More details to come!  If you would like to be added to our email list to get further information, email [email or web address].  We hope to see you there!

## 2022-08-19 NOTE — Telephone Encounter (Signed)
   Patient Name: Joshua [name]  DOB: 07-Apr-1945 MRN: [id number]  Primary Cardiologist: Donato Schultz, MD  Chart reviewed as part of pre-operative protocol coverage. Given past medical history and time since last visit, based on ACC/AHA guidelines, [name] is at acceptable risk for the planned procedure without further cardiovascular testing. This was confirmed by Dr. Elberta Fortis on 08/19/2022 in response to MD inquiry by Carlos Levering, NP  The patient was advised that if he develops new symptoms prior to surgery to contact our office to arrange for a follow-up visit, and he verbalized understanding.  Per office protocol, if patient is without any new symptoms or concerns at the time of their virtual visit, he/she may hold Plavix for 5 days prior to procedure. Please resume Plavix as soon as possible postprocedure, at the discretion of the surgeon.    I will route this recommendation to the requesting party via Epic fax function and remove from pre-op pool.  Please call with questions.  Joni Reining, NP 08/19/2022, 4:18 PM

## 2022-09-05 ENCOUNTER — Other Ambulatory Visit (HOSPITAL_BASED_OUTPATIENT_CLINIC_OR_DEPARTMENT_OTHER): Payer: Self-pay | Admitting: Cardiology

## 2022-09-09 NOTE — Progress Notes (Signed)
Sent message, via epic in basket, requesting orders in epic from surgeon.  

## 2022-09-11 ENCOUNTER — Ambulatory Visit: Payer: Self-pay | Admitting: Emergency Medicine

## 2022-09-11 DIAGNOSIS — G8929 Other chronic pain: Secondary | ICD-10-CM

## 2022-09-11 NOTE — H&P (View-Only) (Signed)
TOTAL KNEE ADMISSION H&P  Patient is being admitted for left total knee arthroplasty.  Subjective:  Chief Complaint:left knee pain.  HPI: Joshua [name], 77 y.o. male, has a history of pain and functional disability in the left knee due to arthritis and has failed non-surgical conservative treatments for greater than 12 weeks to includeNSAID's and/or analgesics, corticosteriod injections, viscosupplementation injections, supervised PT with diminished ADL's post treatment, use of assistive devices, and activity modification.  Onset of symptoms was gradual, starting >10 years ago with gradually worsening course since that time. The patient noted prior procedures on the knee to include  menisectomy on the left knee(s).  Patient currently rates pain in the left knee(s) at 9 out of 10 with activity. Patient has night pain, worsening of pain with activity and weight bearing, pain that interferes with activities of daily living, and pain with passive range of motion.  Patient has evidence of periarticular osteophytes and joint space narrowing by imaging studies.  There is no active infection.  Patient Active Problem List   Diagnosis Date Noted   SBO (small bowel obstruction) (HCC) 05/29/2022   Chest pain 05/26/2022   Epigastric abdominal pain 05/26/2022   Coronary artery disease involving native coronary artery of native heart without angina pectoris 05/28/2021   History of myocardial infarction 02/05/2021   Parkinson's disease    Malignant neoplasm of prostate (HCC) 11/01/2017   Essential tremor 12/06/2015   PVC (premature ventricular contraction) 09/26/2015   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) 04/07/2015   Acute CVA (cerebrovascular accident) (HCC) 03/06/2015   Stroke (cerebrum) (HCC) 03/05/2015   CVA (cerebral infarction) 03/05/2015   Weakness of right upper extremity    Tremor    Ejection fraction    Vertigo    Sleep apnea    Palpitations    Hyperlipidemia  with target LDL less than 70    GERD (gastroesophageal reflux disease)    HTN (hypertension)    Chest pain    RBBB (right bundle branch block)    Ejection fraction    Carotid artery disease (HCC)    Past Medical History:  Diagnosis Date   Allergic rhinitis    Anxiety    from chronic pain from surgery- on Cymbalta   Arthritis    Asthma    Barrett's esophagus 03/29/2014   Carotid artery disease (HCC)    Carotid Doppler normal August, 2007   Diverticulosis    Dyslipidemia    GERD (gastroesophageal reflux disease)    History of loop recorder    has since 04/04/15   HTN (hypertension)    takes Metoprolol for PVC control   Hx of colonic polyps    adenomatous   IFG (impaired fasting glucose)    Palpitations    Benign PVCs   Parkinson disease    Prostate cancer (HCC)    RBBB (right bundle branch block)    rate related   Shingles    TIA (transient ischemic attack) 02/2015   Per pt, had 2 strokes   Vertigo     Past Surgical History:  Procedure Laterality Date   BACK SURGERY  2002,2009   x 6   CHOLECYSTECTOMY  11/30/2012   with IOC   COLONOSCOPY     ELECTROPHYSIOLOGIC STUDY N/A 09/26/2015   Procedure: V Tach Ablation (PVC);  Surgeon: Will Jorja Loa, MD;  Location: [facility] INVASIVE CV LAB;  Service: Cardiovascular;  Laterality: N/A;   EP IMPLANTABLE DEVICE N/A 04/04/2015   Procedure: Loop Recorder Insertion;  Surgeon: Will Jorja Loa, MD;  Location: [facility] INVASIVE CV LAB;  Service: Cardiovascular;  Laterality: N/A;   HERNIA REPAIR     laprascopic   KNEE SURGERY     DECEMBER 2017,LEFT KNEE SCOPED   LEFT HEART CATH AND CORONARY ANGIOGRAPHY N/A 02/05/2021   Procedure: LEFT HEART CATH AND CORONARY ANGIOGRAPHY;  Surgeon: Iran Ouch, MD;  Location: [facility] INVASIVE CV LAB;  Service: Cardiovascular;  Laterality: N/A;   NECK SURGERY  2002   POLYPECTOMY     ROTATOR CUFF REPAIR Left    TEE WITHOUT CARDIOVERSION N/A 04/04/2015   Procedure: TRANSESOPHAGEAL ECHOCARDIOGRAM (TEE);   Surgeon: Lewayne Bunting, MD;  Location[facility] ENDOSCOPY;  Service: Cardiovascular;  Laterality: N/A;   TRIGGER FINGER RELEASE Left 12/28/2019   Procedure: RELEASE TRIGGER FINGER/A-1 PULLEY THUMB, MIDDLE AND RING;  Surgeon: Cindee Salt, MD;  Location: [facility];  Service: Orthopedics;  Laterality: Left;  FAB   UPPER GASTROINTESTINAL ENDOSCOPY     V Tach ablation  09/26/2015    Current Outpatient Medications  Medication Sig Dispense Refill Last Dose   albuterol (VENTOLIN HFA) 108 (90 Base) MCG/ACT inhaler Inhale 2 puffs into the lungs every 4 (four) hours as needed for wheezing or shortness of breath.      azelastine (OPTIVAR) 0.05 % ophthalmic solution Apply 1 drop to eye 2 (two) times daily.      Biotin 16109 MCG TABS Take 1,000 mcg by mouth in the morning.      carbidopa-levodopa (SINEMET CR) 50-200 MG tablet TAKE 1 TABLET BY MOUTH EVERYDAY AT BEDTIME 90 tablet 0    carbidopa-levodopa (SINEMET IR) 25-100 MG tablet 2 TABLETS AT 7 AM, 2 TABLETS AT 10 AM,2 TABLETS AT 1PM,2 TABLETS AT 4 PM /2 TABLETS AT 7 pm 900 tablet 0    cholecalciferol (VITAMIN D3) 25 MCG (1000 UNIT) tablet Take 1,000 Units by mouth daily.      clopidogrel (PLAVIX) 75 MG tablet Take 1 tablet (75 mg total) by mouth daily. (Patient taking differently: Take 75 mg by mouth every evening.) 30 tablet 2    cyanocobalamin (VITAMIN B12) 1000 MCG tablet Take 1,000 mcg by mouth daily.      DYMISTA 137-50 MCG/ACT SUSP Place 1 puff into both nostrils at bedtime.       escitalopram (LEXAPRO) 20 MG tablet Take 20 mg by mouth daily.      finasteride (PROSCAR) 5 MG tablet Take 5 mg by mouth every evening.      lansoprazole (PREVACID) 30 MG capsule Take 30 mg by mouth 2 (two) times daily. Morning & mid-afternoon      levocetirizine (XYZAL) 5 MG tablet Take 5 mg by mouth every evening.        Menthol, Topical Analgesic, (BIOFREEZE EX) Apply 1 Application topically daily as needed (pain).      metoprolol succinate (TOPROL-XL) 25 MG  24 hr tablet TAKE 1/2 TABLET BY MOUTH DAILY (Patient taking differently: Take 12.5 mg by mouth at bedtime.) 45 tablet 2    mexiletine (MEXITIL) 150 MG capsule TAKE 1 CAPSULE BY MOUTH TWICE A DAY 180 capsule 3    midodrine (PROAMATINE) 5 MG tablet TAKE 1 TABLET BY MOUTH 2 TIMES DAILY AS NEEDED. TAKE IF SYSTOLIC BLOOD PRESSURE GETS BELOW 100 (Patient taking differently: Take 5 mg by mouth See admin instructions. Take 5 mg daily, may take a second 5 mg dose as needed for low bp) 180 tablet 3    montelukast (SINGULAIR) 10 MG tablet Take 10  mg by mouth at bedtime.        nitroGLYCERIN (NITROSTAT) 0.4 MG SL tablet Place 1 tablet (0.4 mg total) under the tongue every 5 (five) minutes as needed for chest pain (Do not give more than 3 SL tablets in 15 minutes.). 25 tablet 2    Omega-3 Fatty Acids (FISH OIL) 1000 MG CAPS Take 1,000 mg by mouth in the morning.      PROCTOSOL HC 2.5 % rectal cream APPLY INTO AND AROUND RECTUM 2 TIMES A DAY (Patient taking differently: Place 1 Application rectally 2 (two) times daily as needed for hemorrhoids or anal itching.) 28.35 g 0    rOPINIRole (REQUIP) 1 MG tablet Take 1 tablet (1 mg total) by mouth 3 (three) times daily. 270 tablet 1    rosuvastatin (CRESTOR) 20 MG tablet Take 1 tablet (20 mg total) by mouth at bedtime. 90 tablet 0    tamsulosin (FLOMAX) 0.4 MG CAPS capsule Take 0.4 mg by mouth daily after supper.       traMADol (ULTRAM) 50 MG tablet Take 50 mg by mouth every 6 (six) hours as needed. for pain  0    zolpidem (AMBIEN) 10 MG tablet Take 10 mg by mouth at bedtime.      No current facility-administered medications for this visit.   Allergies  Allergen Reactions   Floxin I.V. In Dextrose 5% [Ofloxacin] Other (See Comments)    Lowers BP    Terazosin Other (See Comments)    Lower bp    Social History   Tobacco Use   Smoking status: Never   Smokeless tobacco: Never  Substance Use Topics   Alcohol use: No    Alcohol/week: 0.0 standard drinks of  alcohol    Family History  Problem Relation Age of Onset   Hypertension Mother    Heart attack Father    Heart disease Father    Clotting disorder Father    Heart disease Brother    Lung cancer Brother    Prostate cancer Brother    Breast cancer Paternal Grandmother    Heart attack Paternal Grandfather    Healthy Daughter    Healthy Daughter    Esophageal cancer Neg Hx    Stomach cancer Neg Hx    Rectal cancer Neg Hx    Colon polyps Neg Hx      Review of Systems  Musculoskeletal:  Positive for arthralgias.  All other systems reviewed and are negative.   Objective:  Physical Exam Constitutional:      General: He is not in acute distress.    Appearance: Normal appearance. He is normal weight.  HENT:     Head: Normocephalic and atraumatic.  Eyes:     Extraocular Movements: Extraocular movements intact.     Conjunctiva/sclera: Conjunctivae normal.     Pupils: Pupils are equal, round, and reactive to light.  Cardiovascular:     Rate and Rhythm: Normal rate and regular rhythm.     Pulses: Normal pulses.     Heart sounds: Normal heart sounds.  Pulmonary:     Effort: Pulmonary effort is normal. No respiratory distress.     Breath sounds: Normal breath sounds.  Abdominal:     General: Bowel sounds are normal. There is no distension.     Palpations: Abdomen is soft.     Tenderness: There is no abdominal tenderness.  Musculoskeletal:        General: Tenderness present.     Cervical back: Normal range of motion and  neck supple.     Comments: TTP over medial and lateral joint line, lateral worse than medial.  No calf tenderness, swelling, or erythema.  No overlying lesions of area of chief complaint.  Decreased strength and ROM due to elicited pain.  Pre-operative ROM 10-120.  Dorsiflexion and plantarflexion intact.  Stable to varus and valgus stress.  Mild numbness of LLE (calf-to-foot) at baseline, otherwise BLE appear grossly neurovascularly intact.  Gait mildly antalgic.    Lymphadenopathy:     Cervical: No cervical adenopathy.  Skin:    General: Skin is warm and dry.     Capillary Refill: Capillary refill takes less than 2 seconds.     Findings: No erythema or rash.  Neurological:     General: No focal deficit present.     Mental Status: He is alert and oriented to person, place, and time.  Psychiatric:        Mood and Affect: Mood normal.        Behavior: Behavior normal.     Vital signs in last 24 hours: @VSRANGES @  Labs:   Estimated body mass index is 29.69 kg/m as calculated from the following:   Height as of 08/17/22: 6\' 1"  (1.854 m).   Weight as of 08/17/22: 102.1 kg.   Imaging Review Plain radiographs demonstrate severe degenerative joint disease of the left knee(s). The overall alignment ismild varus. The bone quality appears to be fair for age and reported activity level.      Assessment/Plan:  End stage arthritis, left knee   The patient history, physical examination, clinical judgment of the provider and imaging studies are consistent with end stage degenerative joint disease of the left knee(s) and total knee arthroplasty is deemed medically necessary. The treatment options including medical management, injection therapy arthroscopy and arthroplasty were discussed at length. The risks and benefits of total knee arthroplasty were presented and reviewed. The risks due to aseptic loosening, infection, stiffness, patella tracking problems, thromboembolic complications and other imponderables were discussed. The patient acknowledged the explanation, agreed to proceed with the plan and consent was signed. Patient is being admitted for inpatient treatment for surgery, pain control, PT, OT, prophylactic antibiotics, VTE prophylaxis, progressive ambulation and ADL's and discharge planning. The patient is planning to be discharged home with outpatient PT.    Anticipated LOS equal to or greater than 2 midnights due to - Age 14 and older with  one or more of the following:  - Obesity  - Expected need for hospital services (PT, OT, Nursing) required for safe  discharge  - Anticipated need for postoperative skilled nursing care or inpatient rehab  - Active co-morbidities: geriatric, CAD, RBBB, Parkinsons, prior MI, prior CVA OR   - Unanticipated findings during/Post Surgery: None  - Patient is a high risk of re-admission due to: None

## 2022-09-11 NOTE — H&P (Signed)
TOTAL KNEE ADMISSION H&P  Patient is being admitted for left total knee arthroplasty.  Subjective:  Chief Complaint:left knee pain.  HPI: Joshua [name], 77 y.o. male, has a history of pain and functional disability in the left knee due to arthritis and has failed non-surgical conservative treatments for greater than 12 weeks to includeNSAID's and/or analgesics, corticosteriod injections, viscosupplementation injections, supervised PT with diminished ADL's post treatment, use of assistive devices, and activity modification.  Onset of symptoms was gradual, starting >10 years ago with gradually worsening course since that time. The patient noted prior procedures on the knee to include  menisectomy on the left knee(s).  Patient currently rates pain in the left knee(s) at 9 out of 10 with activity. Patient has night pain, worsening of pain with activity and weight bearing, pain that interferes with activities of daily living, and pain with passive range of motion.  Patient has evidence of periarticular osteophytes and joint space narrowing by imaging studies.  There is no active infection.  Patient Active Problem List   Diagnosis Date Noted   SBO (small bowel obstruction) (HCC) 05/29/2022   Chest pain 05/26/2022   Epigastric abdominal pain 05/26/2022   Coronary artery disease involving native coronary artery of native heart without angina pectoris 05/28/2021   History of myocardial infarction 02/05/2021   Parkinson's disease    Malignant neoplasm of prostate (HCC) 11/01/2017   Essential tremor 12/06/2015   PVC (premature ventricular contraction) 09/26/2015   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) 04/07/2015   Acute CVA (cerebrovascular accident) (HCC) 03/06/2015   Stroke (cerebrum) (HCC) 03/05/2015   CVA (cerebral infarction) 03/05/2015   Weakness of right upper extremity    Tremor    Ejection fraction    Vertigo    Sleep apnea    Palpitations    Hyperlipidemia  with target LDL less than 70    GERD (gastroesophageal reflux disease)    HTN (hypertension)    Chest pain    RBBB (right bundle branch block)    Ejection fraction    Carotid artery disease (HCC)    Past Medical History:  Diagnosis Date   Allergic rhinitis    Anxiety    from chronic pain from surgery- on Cymbalta   Arthritis    Asthma    Barrett's esophagus 03/29/2014   Carotid artery disease (HCC)    Carotid Doppler normal August, 2007   Diverticulosis    Dyslipidemia    GERD (gastroesophageal reflux disease)    History of loop recorder    has since 04/04/15   HTN (hypertension)    takes Metoprolol for PVC control   Hx of colonic polyps    adenomatous   IFG (impaired fasting glucose)    Palpitations    Benign PVCs   Parkinson disease    Prostate cancer (HCC)    RBBB (right bundle branch block)    rate related   Shingles    TIA (transient ischemic attack) 02/2015   Per pt, had 2 strokes   Vertigo     Past Surgical History:  Procedure Laterality Date   BACK SURGERY  2002,2009   x 6   CHOLECYSTECTOMY  11/30/2012   with IOC   COLONOSCOPY     ELECTROPHYSIOLOGIC STUDY N/A 09/26/2015   Procedure: V Tach Ablation (PVC);  Surgeon: Will Jorja Loa, MD;  Location: [facility];  Service: Cardiovascular;  Laterality: N/A;   EP IMPLANTABLE DEVICE N/A 04/04/2015   Procedure: Loop Recorder Insertion;  Surgeon: Will Jorja Loa, MD;  Location: [facility];  Service: Cardiovascular;  Laterality: N/A;   HERNIA REPAIR     laprascopic   KNEE SURGERY     DECEMBER 2017,LEFT KNEE SCOPED   LEFT HEART CATH AND CORONARY ANGIOGRAPHY N/A 02/05/2021   Procedure: LEFT HEART CATH AND CORONARY ANGIOGRAPHY;  Surgeon: Iran Ouch, MD;  Location: [facility];  Service: Cardiovascular;  Laterality: N/A;   NECK SURGERY  2002   POLYPECTOMY     ROTATOR CUFF REPAIR Left    TEE WITHOUT CARDIOVERSION N/A 04/04/2015   Procedure: TRANSESOPHAGEAL ECHOCARDIOGRAM (TEE);   Surgeon: Lewayne Bunting, MD;  Location: [facility];  Service: Cardiovascular;  Laterality: N/A;   TRIGGER FINGER RELEASE Left 12/28/2019   Procedure: RELEASE TRIGGER FINGER/A-1 PULLEY THUMB, MIDDLE AND RING;  Surgeon: Cindee Salt, MD;  Location:  [facility];  Service: Orthopedics;  Laterality: Left;  FAB   UPPER GASTROINTESTINAL ENDOSCOPY     V Tach ablation  09/26/2015    Current Outpatient Medications  Medication Sig Dispense Refill Last Dose   albuterol (VENTOLIN HFA) 108 (90 Base) MCG/ACT inhaler Inhale 2 puffs into the lungs every 4 (four) hours as needed for wheezing or shortness of breath.      azelastine (OPTIVAR) 0.05 % ophthalmic solution Apply 1 drop to eye 2 (two) times daily.      Biotin 23557 MCG TABS Take 1,000 mcg by mouth in the morning.      carbidopa-levodopa (SINEMET CR) 50-200 MG tablet TAKE 1 TABLET BY MOUTH EVERYDAY AT BEDTIME 90 tablet 0    carbidopa-levodopa (SINEMET IR) 25-100 MG tablet 2 TABLETS AT 7 AM, 2 TABLETS AT 10 AM,2 TABLETS AT 1PM,2 TABLETS AT 4 PM /2 TABLETS AT 7 pm 900 tablet 0    cholecalciferol (VITAMIN D3) 25 MCG (1000 UNIT) tablet Take 1,000 Units by mouth daily.      clopidogrel (PLAVIX) 75 MG tablet Take 1 tablet (75 mg total) by mouth daily. (Patient taking differently: Take 75 mg by mouth every evening.) 30 tablet 2    cyanocobalamin (VITAMIN B12) 1000 MCG tablet Take 1,000 mcg by mouth daily.      DYMISTA 137-50 MCG/ACT SUSP Place 1 puff into both nostrils at bedtime.       escitalopram (LEXAPRO) 20 MG tablet Take 20 mg by mouth daily.      finasteride (PROSCAR) 5 MG tablet Take 5 mg by mouth every evening.      lansoprazole (PREVACID) 30 MG capsule Take 30 mg by mouth 2 (two) times daily. Morning & mid-afternoon      levocetirizine (XYZAL) 5 MG tablet Take 5 mg by mouth every evening.        Menthol, Topical Analgesic, (BIOFREEZE EX) Apply 1 Application topically daily as needed (pain).      metoprolol succinate (TOPROL-XL) 25 MG  24 hr tablet TAKE 1/2 TABLET BY MOUTH DAILY (Patient taking differently: Take 12.5 mg by mouth at bedtime.) 45 tablet 2    mexiletine (MEXITIL) 150 MG capsule TAKE 1 CAPSULE BY MOUTH TWICE A DAY 180 capsule 3    midodrine (PROAMATINE) 5 MG tablet TAKE 1 TABLET BY MOUTH 2 TIMES DAILY AS NEEDED. TAKE IF SYSTOLIC BLOOD PRESSURE GETS BELOW 100 (Patient taking differently: Take 5 mg by mouth See admin instructions. Take 5 mg daily, may take a second 5 mg dose as needed for low bp) 180 tablet 3    montelukast (SINGULAIR) 10 MG tablet Take 10  mg by mouth at bedtime.        nitroGLYCERIN (NITROSTAT) 0.4 MG SL tablet Place 1 tablet (0.4 mg total) under the tongue every 5 (five) minutes as needed for chest pain (Do not give more than 3 SL tablets in 15 minutes.). 25 tablet 2    Omega-3 Fatty Acids (FISH OIL) 1000 MG CAPS Take 1,000 mg by mouth in the morning.      PROCTOSOL HC 2.5 % rectal cream APPLY INTO AND AROUND RECTUM 2 TIMES A DAY (Patient taking differently: Place 1 Application rectally 2 (two) times daily as needed for hemorrhoids or anal itching.) 28.35 g 0    rOPINIRole (REQUIP) 1 MG tablet Take 1 tablet (1 mg total) by mouth 3 (three) times daily. 270 tablet 1    rosuvastatin (CRESTOR) 20 MG tablet Take 1 tablet (20 mg total) by mouth at bedtime. 90 tablet 0    tamsulosin (FLOMAX) 0.4 MG CAPS capsule Take 0.4 mg by mouth daily after supper.       traMADol (ULTRAM) 50 MG tablet Take 50 mg by mouth every 6 (six) hours as needed. for pain  0    zolpidem (AMBIEN) 10 MG tablet Take 10 mg by mouth at bedtime.      No current facility-administered medications for this visit.   Allergies  Allergen Reactions   Floxin I.V. In Dextrose 5% [Ofloxacin] Other (See Comments)    Lowers BP    Terazosin Other (See Comments)    Lower bp    Social History   Tobacco Use   Smoking status: Never   Smokeless tobacco: Never  Substance Use Topics   Alcohol use: No    Alcohol/week: 0.0 standard drinks of  alcohol    Family History  Problem Relation Age of Onset   Hypertension Mother    Heart attack Father    Heart disease Father    Clotting disorder Father    Heart disease Brother    Lung cancer Brother    Prostate cancer Brother    Breast cancer Paternal Grandmother    Heart attack Paternal Grandfather    Healthy Daughter    Healthy Daughter    Esophageal cancer Neg Hx    Stomach cancer Neg Hx    Rectal cancer Neg Hx    Colon polyps Neg Hx      Review of Systems  Musculoskeletal:  Positive for arthralgias.  All other systems reviewed and are negative.   Objective:  Physical Exam Constitutional:      General: He is not in acute distress.    Appearance: Normal appearance. He is normal weight.  HENT:     Head: Normocephalic and atraumatic.  Eyes:     Extraocular Movements: Extraocular movements intact.     Conjunctiva/sclera: Conjunctivae normal.     Pupils: Pupils are equal, round, and reactive to light.  Cardiovascular:     Rate and Rhythm: Normal rate and regular rhythm.     Pulses: Normal pulses.     Heart sounds: Normal heart sounds.  Pulmonary:     Effort: Pulmonary effort is normal. No respiratory distress.     Breath sounds: Normal breath sounds.  Abdominal:     General: Bowel sounds are normal. There is no distension.     Palpations: Abdomen is soft.     Tenderness: There is no abdominal tenderness.  Musculoskeletal:        General: Tenderness present.     Cervical back: Normal range of motion and  neck supple.     Comments: TTP over medial and lateral joint line, lateral worse than medial.  No calf tenderness, swelling, or erythema.  No overlying lesions of area of chief complaint.  Decreased strength and ROM due to elicited pain.  Pre-operative ROM 10-120.  Dorsiflexion and plantarflexion intact.  Stable to varus and valgus stress.  Mild numbness of LLE (calf-to-foot) at baseline, otherwise BLE appear grossly neurovascularly intact.  Gait mildly antalgic.    Lymphadenopathy:     Cervical: No cervical adenopathy.  Skin:    General: Skin is warm and dry.     Capillary Refill: Capillary refill takes less than 2 seconds.     Findings: No erythema or rash.  Neurological:     General: No focal deficit present.     Mental Status: He is alert and oriented to person, place, and time.  Psychiatric:        Mood and Affect: Mood normal.        Behavior: Behavior normal.     Vital signs in last 24 hours: @VSRANGES @  Labs:   Estimated body mass index is 29.69 kg/m as calculated from the following:   Height as of 08/17/22: 6\' 1"  (1.854 m).   Weight as of 08/17/22: 102.1 kg.   Imaging Review Plain radiographs demonstrate severe degenerative joint disease of the left knee(s). The overall alignment ismild varus. The bone quality appears to be fair for age and reported activity level.      Assessment/Plan:  End stage arthritis, left knee   The patient history, physical examination, clinical judgment of the provider and imaging studies are consistent with end stage degenerative joint disease of the left knee(s) and total knee arthroplasty is deemed medically necessary. The treatment options including medical management, injection therapy arthroscopy and arthroplasty were discussed at length. The risks and benefits of total knee arthroplasty were presented and reviewed. The risks due to aseptic loosening, infection, stiffness, patella tracking problems, thromboembolic complications and other imponderables were discussed. The patient acknowledged the explanation, agreed to proceed with the plan and consent was signed. Patient is being admitted for inpatient treatment for surgery, pain control, PT, OT, prophylactic antibiotics, VTE prophylaxis, progressive ambulation and ADL's and discharge planning. The patient is planning to be discharged home with outpatient PT.    Anticipated LOS equal to or greater than 2 midnights due to - Age 70 and older with  one or more of the following:  - Obesity  - Expected need for hospital services (PT, OT, Nursing) required for safe  discharge  - Anticipated need for postoperative skilled nursing care or inpatient rehab  - Active co-morbidities: geriatric, CAD, RBBB, Parkinsons, prior MI, prior CVA OR   - Unanticipated findings during/Post Surgery: None  - Patient is a high risk of re-admission due to: None

## 2022-09-13 ENCOUNTER — Other Ambulatory Visit: Payer: Self-pay | Admitting: Neurology

## 2022-09-13 DIAGNOSIS — G20A1 Parkinson's disease without dyskinesia, without mention of fluctuations: Secondary | ICD-10-CM

## 2022-09-16 NOTE — Progress Notes (Signed)
COVID Vaccine Completed: yes  Date of COVID positive in last 84 days:no  PCP - Martha Clan, MD Cardiologist - Marian Sorrow, MD Electrophysiologist- Loman Brooklyn, MD Neurologist- Kerin Salen, DO  Cardiac clearance by Joni Reining 08/19/22 in Epic   Chest x-ray - 05/26/22 Epic EKG - 05/26/22 Epic Stress Test - 05/28/22 Epic ECHO - 05/27/22 Epic Cardiac Cath - 02/06/21 Epic Pacemaker/ICD device last checked: loop recorder, has been taken out Spinal Cord Stimulator: n/a  Bowel Prep - no  Sleep Study - yes, negative  CPAP -   Fasting Blood Sugar - n/a Checks Blood Sugar _____ times a day  Last dose of GLP1 agonist-  N/A GLP1 instructions:  N/A   Last dose of SGLT-2 inhibitors-  N/A SGLT-2 instructions: N/A   Blood Thinner Instructions:  Plavix, hold 5 days Aspirin Instructions: Last Dose: 09/20/22 1000  Activity level: Can perform activities of daily living without stopping and without symptoms of chest pain or shortness of breath. Difficulty with stairs due to knees  Anesthesia review: HTN, RBBB, CVA, carotid artery disease, PVC, OSA, palpitations  Patient denies shortness of breath, fever, cough and chest pain at PAT appointment  Patient verbalized understanding of instructions that were given to them at the PAT appointment. Patient was also instructed that they will need to review over the PAT instructions again at home before surgery.

## 2022-09-17 ENCOUNTER — Encounter (HOSPITAL_COMMUNITY): Payer: Medicare HMO

## 2022-09-17 ENCOUNTER — Encounter (HOSPITAL_COMMUNITY)
Admission: RE | Admit: 2022-09-17 | Discharge: 2022-09-17 | Disposition: A | Discharge status: Home / Self Care | Payer: Medicare HMO | Source: Ambulatory Visit | Attending: Orthopedic Surgery | Admitting: Orthopedic Surgery

## 2022-09-17 ENCOUNTER — Other Ambulatory Visit: Payer: Self-pay

## 2022-09-17 ENCOUNTER — Encounter (HOSPITAL_COMMUNITY): Payer: Self-pay

## 2022-09-17 VITALS — BP 116/78 | HR 86 | Temp 98.3°F | Resp 16 | Ht 73.0 in | Wt 221.0 lb

## 2022-09-17 DIAGNOSIS — G20A1 Parkinson's disease without dyskinesia, without mention of fluctuations: Secondary | ICD-10-CM | POA: Diagnosis not present

## 2022-09-17 DIAGNOSIS — I10 Essential (primary) hypertension: Secondary | ICD-10-CM | POA: Diagnosis not present

## 2022-09-17 DIAGNOSIS — M25562 Pain in left knee: Secondary | ICD-10-CM | POA: Insufficient documentation

## 2022-09-17 DIAGNOSIS — Z8673 Personal history of transient ischemic attack (TIA), and cerebral infarction without residual deficits: Secondary | ICD-10-CM | POA: Diagnosis not present

## 2022-09-17 DIAGNOSIS — G8929 Other chronic pain: Secondary | ICD-10-CM | POA: Diagnosis not present

## 2022-09-17 DIAGNOSIS — Z01812 Encounter for preprocedural laboratory examination: Secondary | ICD-10-CM | POA: Insufficient documentation

## 2022-09-17 DIAGNOSIS — Z01818 Encounter for other preprocedural examination: Secondary | ICD-10-CM | POA: Diagnosis present

## 2022-09-17 DIAGNOSIS — Z8546 Personal history of malignant neoplasm of prostate: Secondary | ICD-10-CM | POA: Insufficient documentation

## 2022-09-17 DIAGNOSIS — I2510 Atherosclerotic heart disease of native coronary artery without angina pectoris: Secondary | ICD-10-CM | POA: Insufficient documentation

## 2022-09-17 DIAGNOSIS — M1712 Unilateral primary osteoarthritis, left knee: Secondary | ICD-10-CM | POA: Insufficient documentation

## 2022-09-17 HISTORY — DX: Acute myocardial infarction, unspecified: I21.9

## 2022-09-17 LAB — COMPREHENSIVE METABOLIC PANEL
ALT: 7 U/L (ref 0–44)
AST: 19 U/L (ref 15–41)
Albumin: 4.2 g/dL (ref 3.5–5.0)
Alkaline Phosphatase: 61 U/L (ref 38–126)
Anion gap: 9 (ref 5–15)
BUN: 18 mg/dL (ref 8–23)
CO2: 23 mmol/L (ref 22–32)
Calcium: 9.1 mg/dL (ref 8.9–10.3)
Chloride: 105 mmol/L (ref 98–111)
Creatinine, Ser: 0.97 mg/dL (ref 0.61–1.24)
GFR, Estimated: >60 mL/min (ref >60)
Glucose, Bld: 124 mg/dL — ABNORMAL HIGH (ref 70–99)
Potassium: 3.8 mmol/L (ref 3.5–5.1)
Sodium: 137 mmol/L (ref 135–145)
Total Bilirubin: 0.8 mg/dL (ref 0.3–1.2)
Total Protein: 7 g/dL (ref 6.5–8.1)

## 2022-09-17 LAB — CBC WITH DIFFERENTIAL/PLATELET
Abs Immature Granulocytes: 0.02 10*3/uL (ref 0.00–0.07)
Basophils Absolute: 0.1 10*3/uL (ref 0.0–0.1)
Basophils Relative: 1 %
Eosinophils Absolute: 0.1 10*3/uL (ref 0.0–0.5)
Eosinophils Relative: 2 %
HCT: 43.1 % (ref 39.0–52.0)
Hemoglobin: 14 g/dL (ref 13.0–17.0)
Immature Granulocytes: 0 %
Lymphocytes Relative: 28 %
Lymphs Abs: 1.7 10*3/uL (ref 0.7–4.0)
MCH: 31.3 pg (ref 26.0–34.0)
MCHC: 32.5 g/dL (ref 30.0–36.0)
MCV: 96.2 fL (ref 80.0–100.0)
Monocytes Absolute: 0.6 10*3/uL (ref 0.1–1.0)
Monocytes Relative: 10 %
Neutro Abs: 3.4 10*3/uL (ref 1.7–7.7)
Neutrophils Relative %: 59 %
Platelets: 243 10*3/uL (ref 150–400)
RBC: 4.48 MIL/uL (ref 4.22–5.81)
RDW: 13.5 % (ref 11.5–15.5)
WBC: 5.9 10*3/uL (ref 4.0–10.5)
nRBC: 0 % (ref 0.0–0.2)

## 2022-09-17 LAB — TYPE AND SCREEN
ABO/RH(D): O POS
Antibody Screen: NEGATIVE

## 2022-09-17 LAB — SURGICAL PCR SCREEN
MRSA, PCR: NEGATIVE
Staphylococcus aureus: NEGATIVE

## 2022-09-17 NOTE — Patient Instructions (Addendum)
SURGICAL WAITING ROOM VISITATION  Patients having surgery or a procedure may have no more than 2 support people in the waiting area - these visitors may rotate.    Children under the age of 50 must have an adult with them who is not the patient.  Due to an increase in RSV and influenza rates and associated hospitalizations, children ages 68 and under may not visit patients in [facility] hospitals.  If the patient needs to stay at the hospital during part of their recovery, the visitor guidelines for inpatient rooms apply. Pre-op nurse will coordinate an appropriate time for 1 support person to accompany patient in pre-op.  This support person may not rotate.    Please refer to the [facility] website for the visitor guidelines for Inpatients (after your surgery is over and you are in a regular room).    Your procedure is scheduled on: 09/28/22   Report to [facility] Main Entrance    Report to admitting at 5:15 AM   Call this number if you have problems the morning of surgery [phone]   Do not eat food :After Midnight.   After Midnight you may have the following liquids until 4:30 AM DAY OF SURGERY  Water Non-Citrus Juices (without pulp, NO RED-Apple, White grape, White cranberry) Black Coffee (NO MILK/CREAM OR CREAMERS, sugar ok)  Clear Tea (NO MILK/CREAM OR CREAMERS, sugar ok) regular and decaf                             Plain Jell-O (NO RED)                                           Fruit ices (not with fruit pulp, NO RED)                                     Popsicles (NO RED)                                                               Sports drinks like Gatorade (NO RED)                 The day of surgery:  Drink ONE (1) Pre-Surgery Clear Ensure at 4:30 AM the morning of surgery. Drink in one sitting. Do not sip.  This drink was given to you during your hospital  pre-op appointment visit. Nothing else to drink after completing the  Pre-Surgery Clear  Ensure.          If you have questions, please contact your surgeon's office.   FOLLOW BOWEL PREP AND ANY ADDITIONAL PRE OP INSTRUCTIONS YOU RECEIVED FROM YOUR SURGEON'S OFFICE!!!     Oral Hygiene is also important to reduce your risk of infection.                                    Remember - BRUSH YOUR TEETH THE MORNING OF SURGERY WITH YOUR REGULAR TOOTHPASTE  DENTURES WILL  BE REMOVED PRIOR TO SURGERY PLEASE DO NOT APPLY "Poly grip" OR ADHESIVES!!!   Stop all vitamins and herbal supplements 7 days before surgery.   Take these medicines the morning of surgery with A SIP OF WATER: Albuterol, Cabidopa-Levodopa, Lexapro, Prevacid, Tramadol                               You may not have any metal on your body including jewelry, and body piercing             Do not wear lotions, powders, cologne, or deodorant              Men may shave face and neck.   Do not bring valuables to the hospital. [facility] IS NOT             RESPONSIBLE   FOR VALUABLES.   Contacts, glasses, dentures or bridgework may not be worn into surgery.   Bring small overnight bag day of surgery.   DO NOT BRING YOUR HOME MEDICATIONS TO THE HOSPITAL. PHARMACY WILL DISPENSE MEDICATIONS LISTED ON YOUR MEDICATION LIST TO YOU DURING YOUR ADMISSION IN THE HOSPITAL!   Special Instructions: Bring a copy of your healthcare power of attorney and living will documents the day of surgery if you haven't scanned them before.              Please read over the following fact sheets you were given: IF YOU HAVE QUESTIONS ABOUT YOUR PRE-OP INSTRUCTIONS PLEASE CALL [name]    If you received a COVID test during your pre-op visit  it is requested that you wear a mask when out in public, stay away from anyone that may not be feeling well and notify your surgeon if you develop symptoms. If you test positive for Covid or have been in contact with anyone that has tested positive in the last 10 days please notify you  surgeon.      Pre-operative 5 CHG Bath Instructions   You can play a key role in reducing the risk of infection after surgery. Your skin needs to be as free of germs as possible. You can reduce the number of germs on your skin by washing with CHG (chlorhexidine gluconate) soap before surgery. CHG is an antiseptic soap that kills germs and continues to kill germs even after washing.   DO NOT use if you have an allergy to chlorhexidine/CHG or antibacterial soaps. If your skin becomes reddened or irritated, stop using the CHG and notify one of our RNs at [phone].   Please shower with the CHG soap starting 4 days before surgery using the following schedule:     Please keep in mind the following:  DO NOT shave, including legs and underarms, starting the day of your first shower.   You may shave your face at any point before/day of surgery.  Place clean sheets on your bed the day you start using CHG soap. Use a clean washcloth (not used since being washed) for each shower. DO NOT sleep with pets once you start using the CHG.   CHG Shower Instructions:  If you choose to wash your hair and private area, wash first with your normal shampoo/soap.  After you use shampoo/soap, rinse your hair and body thoroughly to remove shampoo/soap residue.  Turn the water OFF and apply about 3 tablespoons (45 ml) of CHG soap to a CLEAN washcloth.  Apply CHG soap ONLY FROM  YOUR NECK DOWN TO YOUR TOES (washing for 3-5 minutes)  DO NOT use CHG soap on face, private areas, open wounds, or sores.  Pay special attention to the area where your surgery is being performed.  If you are having back surgery, having someone wash your back for you may be helpful. Wait 2 minutes after CHG soap is applied, then you may rinse off the CHG soap.  Pat dry with a clean towel  Put on clean clothes/pajamas   If you choose to wear lotion, please use ONLY the CHG-compatible lotions on the back of this paper.     Additional  instructions for the day of surgery: DO NOT APPLY any lotions, deodorants, cologne, or perfumes.   Put on clean/comfortable clothes.  Brush your teeth.  Ask your nurse before applying any prescription medications to the skin.      CHG Compatible Lotions   Aveeno Moisturizing lotion  Cetaphil Moisturizing Cream  Cetaphil Moisturizing Lotion  Clairol Herbal Essence Moisturizing Lotion, Dry Skin  Clairol Herbal Essence Moisturizing Lotion, Extra Dry Skin  Clairol Herbal Essence Moisturizing Lotion, Normal Skin  Curel Age Defying Therapeutic Moisturizing Lotion with Alpha Hydroxy  Curel Extreme Care Body Lotion  Curel Soothing Hands Moisturizing Hand Lotion  Curel Therapeutic Moisturizing Cream, Fragrance-Free  Curel Therapeutic Moisturizing Lotion, Fragrance-Free  Curel Therapeutic Moisturizing Lotion, Original Formula  Eucerin Daily Replenishing Lotion  Eucerin Dry Skin Therapy Plus Alpha Hydroxy Crme  Eucerin Dry Skin Therapy Plus Alpha Hydroxy Lotion  Eucerin Original Crme  Eucerin Original Lotion  Eucerin Plus Crme Eucerin Plus Lotion  Eucerin TriLipid Replenishing Lotion  Keri Anti-Bacterial Hand Lotion  Keri Deep Conditioning Original Lotion Dry Skin Formula Softly Scented  Keri Deep Conditioning Original Lotion, Fragrance Free Sensitive Skin Formula  Keri Lotion Fast Absorbing Fragrance Free Sensitive Skin Formula  Keri Lotion Fast Absorbing Softly Scented Dry Skin Formula  Keri Original Lotion  Keri Skin Renewal Lotion Keri Silky Smooth Lotion  Keri Silky Smooth Sensitive Skin Lotion  Nivea Body Creamy Conditioning Oil  Nivea Body Extra Enriched Lotion  Nivea Body Original Lotion  Nivea Body Sheer Moisturizing Lotion Nivea Crme  Nivea Skin Firming Lotion  NutraDerm 30 Skin Lotion  NutraDerm Skin Lotion  NutraDerm Therapeutic Skin Cream  NutraDerm Therapeutic Skin Lotion  ProShield Protective Hand Cream  Provon moisturizing lotion   Incentive  Spirometer  An incentive spirometer is a tool that can help keep your lungs clear and active. This tool measures how well you are filling your lungs with each breath. Taking long deep breaths may help reverse or decrease the chance of developing breathing (pulmonary) problems (especially infection) following: A long period of time when you are unable to move or be active. BEFORE THE PROCEDURE  If the spirometer includes an indicator to show your best effort, your nurse or respiratory therapist will set it to a desired goal. If possible, sit up straight or lean slightly forward. Try not to slouch. Hold the incentive spirometer in an upright position. INSTRUCTIONS FOR USE  Sit on the edge of your bed if possible, or sit up as far as you can in bed or on a chair. Hold the incentive spirometer in an upright position. Breathe out normally. Place the mouthpiece in your mouth and seal your lips tightly around it. Breathe in slowly and as deeply as possible, raising the piston or the ball toward the top of the column. Hold your breath for 3-5 seconds or for as long as possible. Allow  the piston or ball to fall to the bottom of the column. Remove the mouthpiece from your mouth and breathe out normally. Rest for a few seconds and repeat Steps 1 through 7 at least 10 times every 1-2 hours when you are awake. Take your time and take a few normal breaths between deep breaths. The spirometer may include an indicator to show your best effort. Use the indicator as a goal to work toward during each repetition. After each set of 10 deep breaths, practice coughing to be sure your lungs are clear. If you have an incision (the cut made at the time of surgery), support your incision when coughing by placing a pillow or rolled up towels firmly against it. Once you are able to get out of bed, walk around indoors and cough well. You may stop using the incentive spirometer when instructed by your caregiver.  RISKS AND  COMPLICATIONS Take your time so you do not get dizzy or light-headed. If you are in pain, you may need to take or ask for pain medication before doing incentive spirometry. It is harder to take a deep breath if you are having pain. AFTER USE Rest and breathe slowly and easily. It can be helpful to keep track of a log of your progress. Your caregiver can provide you with a simple table to help with this. If you are using the spirometer at home, follow these instructions: SEEK MEDICAL CARE IF:  You are having difficultly using the spirometer. You have trouble using the spirometer as often as instructed. Your pain medication is not giving enough relief while using the spirometer. You develop fever of 100.5 F (38.1 C) or higher. SEEK IMMEDIATE MEDICAL CARE IF:  You cough up bloody sputum that had not been present before. You develop fever of 102 F (38.9 C) or greater. You develop worsening pain at or near the incision site. MAKE SURE YOU:  Understand these instructions. Will watch your condition. Will get help right away if you are not doing well or get worse. Document Released: 05/11/2006 Document Revised: 03/23/2011 Document Reviewed: 07/12/2006 Exit[employer] Patient Information 2014 [employer], [place].   ________________________________________________________________________ WHAT IS A BLOOD TRANSFUSION? Blood Transfusion Information  A transfusion is the replacement of blood or some of its parts. Blood is made up of multiple cells which provide different functions. Red blood cells carry oxygen and are used for blood loss replacement. White blood cells fight against infection. Platelets control bleeding. Plasma helps clot blood. Other blood products are available for specialized needs, such as hemophilia or other clotting disorders. BEFORE THE TRANSFUSION  Who gives blood for transfusions?  Healthy volunteers who are fully evaluated to make sure their blood is safe. This is blood bank  blood. Transfusion therapy is the safest it has ever been in the practice of medicine. Before blood is taken from a donor, a complete history is taken to make sure that person has no history of diseases nor engages in risky social behavior (examples are intravenous drug use or sexual activity with multiple partners). The donor's travel history is screened to minimize risk of transmitting infections, such as malaria. The donated blood is tested for signs of infectious diseases, such as HIV and hepatitis. The blood is then tested to be sure it is compatible with you in order to minimize the chance of a transfusion reaction. If you or a relative donates blood, this is often done in anticipation of surgery and is not appropriate for emergency situations. It takes many  days to process the donated blood. RISKS AND COMPLICATIONS Although transfusion therapy is very safe and saves many lives, the main dangers of transfusion include:  Getting an infectious disease. Developing a transfusion reaction. This is an allergic reaction to something in the blood you were given. Every precaution is taken to prevent this. The decision to have a blood transfusion has been considered carefully by your caregiver before blood is given. Blood is not given unless the benefits outweigh the risks. AFTER THE TRANSFUSION Right after receiving a blood transfusion, you will usually feel much better and more energetic. This is especially true if your red blood cells have gotten low (anemic). The transfusion raises the level of the red blood cells which carry oxygen, and this usually causes an energy increase. The nurse administering the transfusion will monitor you carefully for complications. HOME CARE INSTRUCTIONS  No special instructions are needed after a transfusion. You may find your energy is better. Speak with your caregiver about any limitations on activity for underlying diseases you may have. SEEK MEDICAL CARE IF:  Your  condition is not improving after your transfusion. You develop redness or irritation at the intravenous (IV) site. SEEK IMMEDIATE MEDICAL CARE IF:  Any of the following symptoms occur over the next 12 hours: Shaking chills. You have a temperature by mouth above 102 F (38.9 C), not controlled by medicine. Chest, back, or muscle pain. People around you feel you are not acting correctly or are confused. Shortness of breath or difficulty breathing. Dizziness and fainting. You get a rash or develop hives. You have a decrease in urine output. Your urine turns a dark color or changes to pink, red, or brown. Any of the following symptoms occur over the next 10 days: You have a temperature by mouth above 102 F (38.9 C), not controlled by medicine. Shortness of breath. Weakness after normal activity. The white part of the eye turns yellow (jaundice). You have a decrease in the amount of urine or are urinating less often. Your urine turns a dark color or changes to pink, red, or brown. Document Released: 12/27/1999 Document Revised: 03/23/2011 Document Reviewed: 08/15/2007 [facility] Patient Information 2014 [place], [place].  _______________________________________________________________________

## 2022-09-18 NOTE — Anesthesia Preprocedure Evaluation (Addendum)
Anesthesia Evaluation  Patient identified by MRN, date of birth, ID band Patient awake    Reviewed: Allergy & Precautions, NPO status , Patient's Chart, lab work & pertinent test results  Airway Mallampati: II  TM Distance: >3 FB Neck ROM: Full    Dental no notable dental hx.    Pulmonary asthma , sleep apnea    Pulmonary exam normal        Cardiovascular hypertension, Pt. on medications and Pt. on home beta blockers + CAD and + Past MI  + dysrhythmias  Rhythm:Regular Rate:Normal  IMPRESSIONS    1. Left ventricular ejection fraction, by estimation, is 60 to 65%. The left ventricle has normal function. The left ventricle has no regional wall motion abnormalities. There is mild concentric left ventricular hypertrophy. Left ventricular diastolic parameters are consistent with Grade I diastolic dysfunction (impaired relaxation).  2. Right ventricular systolic function is normal. The right ventricular size is not well visualized.  3. The mitral valve was not well visualized. No evidence of mitral valve regurgitation.  4. The aortic valve was not well visualized. Aortic valve regurgitation is not visualized. No aortic stenosis is present.   Comparison(s): No significant change from prior study. This study is technically more challenging from prior.    Neuro/Psych   Anxiety    Dementia CVA    GI/Hepatic Neg liver ROS,GERD  Medicated,,  Endo/Other  negative endocrine ROS    Renal/GU negative Renal ROS  negative genitourinary   Musculoskeletal  (+) Arthritis , Osteoarthritis,    Abdominal Normal abdominal exam  (+)   Peds  Hematology Lab Results      Component                Value               Date                      WBC                      5.9                 09/17/2022                HGB                      14.0                09/17/2022                HCT                      43.1                09/17/2022                 MCV                      96.2                09/17/2022                PLT                      243                 09/17/2022  Anesthesia Other Findings   Reproductive/Obstetrics                             Anesthesia Physical Anesthesia Plan  ASA: 3  Anesthesia Plan: MAC, Regional and Spinal   Post-op Pain Management: Regional block*   Induction: Intravenous  PONV Risk Score and Plan: 1 and Ondansetron, Dexamethasone, Treatment may vary due to age or medical condition and Propofol infusion  Airway Management Planned: Simple Face Mask and Nasal Cannula  Additional Equipment: None  Intra-op Plan:   Post-operative Plan:   Informed Consent: I have reviewed the patients History and Physical, chart, labs and discussed the procedure including the risks, benefits and alternatives for the proposed anesthesia with the patient or authorized representative who has indicated his/her understanding and acceptance.     Dental advisory given  Plan Discussed with: CRNA  Anesthesia Plan Comments: (See PAT note 09/17/2022)       Anesthesia Quick Evaluation

## 2022-09-18 NOTE — Progress Notes (Signed)
Anesthesia Chart Review   Case: [id number] Date/Time: 09/28/22 0715   Procedure: TOTAL KNEE ARTHROPLASTY (Left: Knee)   Anesthesia type: Spinal   Pre-op diagnosis: OA LEFT KNEE   Location: [facility] ROOM 08 / [facility]   Surgeons: Joen Laura, MD       DISCUSSION:77 y.o. never smoker with h/o HTN, RBBB, CAD, PVCs s/p ablation, TIA, Parkinson's disease, prostate cancer, left knee OA scheduled for above procedure 09/28/2022 with Dr. Weber Cooks.   Per cardiology preoperative evaluation 08/19/2022, "Chart reviewed as part of pre-operative protocol coverage. Given past medical history and time since last visit, based on ACC/AHA guidelines, Cameryn Gildea is at acceptable risk for the planned procedure without further cardiovascular testing. This was confirmed by Dr. Elberta Fortis on 08/19/2022 in response to MD inquiry by Carlos Levering, NP   The patient was advised that if he develops new symptoms prior to surgery to contact our office to arrange for a follow-up visit, and he verbalized understanding.   Per office protocol, if patient is without any new symptoms or concerns at the time of their virtual visit, he/she may hold Plavix for 5 days prior to procedure. Please resume Plavix as soon as possible postprocedure, at the discretion of the surgeon. "  Pt reports last dose of Plavix 09/20/2022.  VS: BP 116/78   Pulse 86   Temp 36.8 C (Oral)   Resp 16   Ht 6\' 1"  (1.854 m)   Wt 100.2 kg   SpO2 94%   BMI 29.16 kg/m   PROVIDERS: [name], MD is PCP   Cardiologist - Marian Sorrow, MD   Electrophysiologist- Loman Brooklyn, MD  Neurologist- Kerin Salen, DO  LABS: Labs reviewed: Acceptable for surgery. (all labs ordered are listed, but only abnormal results are displayed)  Labs Reviewed  COMPREHENSIVE METABOLIC PANEL - Abnormal; Notable for the following components:      Result Value   Glucose, Bld 124 (*)    All other components within normal limits  SURGICAL PCR  SCREEN  CBC WITH DIFFERENTIAL/PLATELET  TYPE AND SCREEN     IMAGES:   EKG:   CV: Echo 05/27/2022  1. Left ventricular ejection fraction, by estimation, is 60 to 65%. The  left ventricle has normal function. The left ventricle has no regional  wall motion abnormalities. There is mild concentric left ventricular  hypertrophy. Left ventricular diastolic  parameters are consistent with Grade I diastolic dysfunction (impaired  relaxation).   2. Right ventricular systolic function is normal. The right ventricular  size is not well visualized.   3. The mitral valve was not well visualized. No evidence of mitral valve  regurgitation.   4. The aortic valve was not well visualized. Aortic valve regurgitation  is not visualized. No aortic stenosis is present.   Comparison(s): No significant change from prior study. This study is  technically more challenging from prior.  Past Medical History:  Diagnosis Date   Allergic rhinitis    Anxiety    from chronic pain from surgery- on Cymbalta   Arthritis    Asthma    Barrett's esophagus 03/29/2014   Carotid artery disease (HCC)    Carotid Doppler normal August, 2007   Diverticulosis    Dyslipidemia    GERD (gastroesophageal reflux disease)    History of loop recorder    has since 04/04/15   HTN (hypertension)    takes Metoprolol for PVC control   Hx of colonic polyps    adenomatous  IFG (impaired fasting glucose)    Myocardial infarction (HCC)    mild   Palpitations    Benign PVCs   Parkinson disease    Prostate cancer (HCC)    RBBB (right bundle branch block)    rate related   Shingles    TIA (transient ischemic attack) 02/2015   Per pt, had 2 strokes   Vertigo     Past Surgical History:  Procedure Laterality Date   BACK SURGERY  2002,2009   x 6   CHOLECYSTECTOMY  11/30/2012   with IOC   COLONOSCOPY     ELECTROPHYSIOLOGIC STUDY N/A 09/26/2015   Procedure: V Tach Ablation (PVC);  Surgeon: Will Jorja Loa, MD;   Location: [facility];  Service: Cardiovascular;  Laterality: N/A;   EP IMPLANTABLE DEVICE N/A 04/04/2015   Procedure: Loop Recorder Insertion;  Surgeon: Will Jorja Loa, MD;  Location: [facility];  Service: Cardiovascular;  Laterality: N/A;   HERNIA REPAIR     laprascopic   KNEE SURGERY     DECEMBER 2017,LEFT KNEE SCOPED   LEFT HEART CATH AND CORONARY ANGIOGRAPHY N/A 02/05/2021   Procedure: LEFT HEART CATH AND CORONARY ANGIOGRAPHY;  Surgeon: Iran Ouch, MD;  Location: [facility] LAB;  Service: Cardiovascular;  Laterality: N/A;   NECK SURGERY  2002   POLYPECTOMY     ROTATOR CUFF REPAIR Left    TEE WITHOUT CARDIOVERSION N/A 04/04/2015   Procedure: TRANSESOPHAGEAL ECHOCARDIOGRAM (TEE);  Surgeon: Lewayne Bunting, MD;  Location: [facility] ENDOSCOPY;  Service: Cardiovascular;  Laterality: N/A;   TRIGGER FINGER RELEASE Left 12/28/2019   Procedure: RELEASE TRIGGER FINGER/A-1 PULLEY THUMB, MIDDLE AND RING;  Surgeon: Cindee Salt, MD;  Location: [facility];  Service: Orthopedics;  Laterality: Left;  FAB   UPPER GASTROINTESTINAL ENDOSCOPY     V Tach ablation  09/26/2015    MEDICATIONS:  albuterol (VENTOLIN HFA) 108 (90 Base) MCG/ACT inhaler   azelastine (OPTIVAR) 0.05 % ophthalmic solution   Biotin 41324 MCG TABS   carbidopa-levodopa (SINEMET CR) 50-200 MG tablet   carbidopa-levodopa (SINEMET IR) 25-100 MG tablet   cholecalciferol (VITAMIN D3) 25 MCG (1000 UNIT) tablet   clopidogrel (PLAVIX) 75 MG tablet   cyanocobalamin (VITAMIN B12) 1000 MCG tablet   DYMISTA 137-50 MCG/ACT SUSP   escitalopram (LEXAPRO) 20 MG tablet   finasteride (PROSCAR) 5 MG tablet   lansoprazole (PREVACID) 30 MG capsule   levocetirizine (XYZAL) 5 MG tablet   Menthol, Topical Analgesic, (BIOFREEZE EX)   metoprolol succinate (TOPROL-XL) 25 MG 24 hr tablet   mexiletine (MEXITIL) 150 MG capsule   midodrine (PROAMATINE) 5 MG tablet   montelukast (SINGULAIR) 10 MG tablet   nitroGLYCERIN  (NITROSTAT) 0.4 MG SL tablet   Omega-3 Fatty Acids (FISH OIL) 1000 MG CAPS   PROCTOSOL HC 2.5 % rectal cream   rOPINIRole (REQUIP) 1 MG tablet   rosuvastatin (CRESTOR) 20 MG tablet   tamsulosin (FLOMAX) 0.4 MG CAPS capsule   traMADol (ULTRAM) 50 MG tablet   zolpidem (AMBIEN) 10 MG tablet   No current facility-administered medications for this encounter.    Jodell Cipro Ward, PA-C [facility] Testing [phone]

## 2022-09-24 ENCOUNTER — Ambulatory Visit: Payer: Medicare HMO | Admitting: Physical Therapy

## 2022-09-24 ENCOUNTER — Ambulatory Visit: Payer: Medicare HMO | Admitting: Occupational Therapy

## 2022-09-24 ENCOUNTER — Ambulatory Visit: Payer: Medicare HMO | Admitting: Speech Pathology

## 2022-09-28 ENCOUNTER — Ambulatory Visit (HOSPITAL_COMMUNITY): Payer: Self-pay | Admitting: Anesthesiology

## 2022-09-28 ENCOUNTER — Other Ambulatory Visit: Payer: Self-pay

## 2022-09-28 ENCOUNTER — Observation Stay (HOSPITAL_COMMUNITY)
Admission: RE | Admit: 2022-09-28 | Discharge: 2022-09-29 | Disposition: A | Discharge status: Home / Self Care | Payer: Medicare HMO | Attending: Orthopedic Surgery | Admitting: Orthopedic Surgery

## 2022-09-28 ENCOUNTER — Encounter (HOSPITAL_COMMUNITY)
Admission: RE | Disposition: A | Discharge status: Home / Self Care | Payer: Self-pay | Source: Home / Self Care | Attending: Orthopedic Surgery

## 2022-09-28 ENCOUNTER — Observation Stay (HOSPITAL_COMMUNITY): Payer: Medicare HMO

## 2022-09-28 ENCOUNTER — Encounter (HOSPITAL_COMMUNITY): Payer: Self-pay | Admitting: Orthopedic Surgery

## 2022-09-28 ENCOUNTER — Ambulatory Visit (HOSPITAL_COMMUNITY): Payer: Medicare HMO | Admitting: Physician Assistant

## 2022-09-28 DIAGNOSIS — Z79899 Other long term (current) drug therapy: Secondary | ICD-10-CM | POA: Insufficient documentation

## 2022-09-28 DIAGNOSIS — G8918 Other acute postprocedural pain: Secondary | ICD-10-CM | POA: Diagnosis not present

## 2022-09-28 DIAGNOSIS — I2510 Atherosclerotic heart disease of native coronary artery without angina pectoris: Secondary | ICD-10-CM

## 2022-09-28 DIAGNOSIS — M1712 Unilateral primary osteoarthritis, left knee: Secondary | ICD-10-CM | POA: Diagnosis present

## 2022-09-28 DIAGNOSIS — J45909 Unspecified asthma, uncomplicated: Secondary | ICD-10-CM | POA: Insufficient documentation

## 2022-09-28 DIAGNOSIS — G20C Parkinsonism, unspecified: Secondary | ICD-10-CM | POA: Diagnosis not present

## 2022-09-28 DIAGNOSIS — I10 Essential (primary) hypertension: Secondary | ICD-10-CM | POA: Diagnosis not present

## 2022-09-28 DIAGNOSIS — Z8673 Personal history of transient ischemic attack (TIA), and cerebral infarction without residual deficits: Secondary | ICD-10-CM | POA: Insufficient documentation

## 2022-09-28 DIAGNOSIS — Z7902 Long term (current) use of antithrombotics/antiplatelets: Secondary | ICD-10-CM | POA: Diagnosis not present

## 2022-09-28 DIAGNOSIS — Z8546 Personal history of malignant neoplasm of prostate: Secondary | ICD-10-CM | POA: Insufficient documentation

## 2022-09-28 HISTORY — PX: TOTAL KNEE ARTHROPLASTY: SHX125

## 2022-09-28 SURGERY — ARTHROPLASTY, KNEE, TOTAL
Anesthesia: Monitor Anesthesia Care | Site: Knee | Laterality: Left

## 2022-09-28 MED ORDER — CEFAZOLIN SODIUM-DEXTROSE 2-4 GM/100ML-% IV SOLN
2.0000 g | INTRAVENOUS | Status: AC
  Administered 2022-09-28: 2 g via INTRAVENOUS
  Filled 2022-09-28: qty 100

## 2022-09-28 MED ORDER — CELECOXIB 100 MG PO CAPS: 100.0000 mg | ORAL_CAPSULE | Freq: Two times a day (BID) | ORAL | 0 refills | Status: AC

## 2022-09-28 MED ORDER — ROSUVASTATIN CALCIUM 20 MG PO TABS
20.0000 mg | ORAL_TABLET | Freq: Every day | ORAL | Status: DC
  Administered 2022-09-28: 20 mg via ORAL
  Filled 2022-09-28: qty 1

## 2022-09-28 MED ORDER — ACETAMINOPHEN 500 MG PO TABS: 1000.0000 mg | ORAL_TABLET | Freq: Three times a day (TID) | ORAL | Status: AC | PRN

## 2022-09-28 MED ORDER — CLOPIDOGREL BISULFATE 75 MG PO TABS
75.0000 mg | ORAL_TABLET | Freq: Every day | ORAL | Status: DC
  Filled 2022-09-28: qty 1

## 2022-09-28 MED ORDER — DEXAMETHASONE SODIUM PHOSPHATE 10 MG/ML IJ SOLN
INTRAMUSCULAR | Status: DC | PRN
Start: 2022-09-28 — End: 2022-09-28
  Administered 2022-09-28: 10 mg

## 2022-09-28 MED ORDER — BUPIVACAINE LIPOSOME 1.3 % IJ SUSP
INTRAMUSCULAR | Status: DC | PRN
  Administered 2022-09-28: 20 mL

## 2022-09-28 MED ORDER — POLYETHYLENE GLYCOL 3350 17 G PO PACK: 17.0000 g | PACK | Freq: Every day | ORAL | 0 refills | Status: AC

## 2022-09-28 MED ORDER — MIDAZOLAM HCL 5 MG/5ML IJ SOLN
INTRAMUSCULAR | Status: DC | PRN
  Administered 2022-09-28: .5 mg via INTRAVENOUS
  Administered 2022-09-28: 1 mg via INTRAVENOUS

## 2022-09-28 MED ORDER — METHOCARBAMOL 500 MG PO TABS: 500.0000 mg | ORAL_TABLET | Freq: Three times a day (TID) | ORAL | 0 refills | Status: AC | PRN

## 2022-09-28 MED ORDER — SODIUM CHLORIDE 0.9% FLUSH
INTRAVENOUS | Status: DC | PRN
  Administered 2022-09-28: 30 mL

## 2022-09-28 MED ORDER — ASPIRIN 81 MG PO TBEC: 81.0000 mg | DELAYED_RELEASE_TABLET | Freq: Two times a day (BID) | ORAL | Status: AC

## 2022-09-28 MED ORDER — ONDANSETRON HCL 4 MG PO TABS: 4.0000 mg | ORAL_TABLET | Freq: Four times a day (QID) | ORAL | Status: DC | PRN

## 2022-09-28 MED ORDER — POVIDONE-IODINE 10 % EX SWAB
2.0000 | Freq: Once | CUTANEOUS | Status: AC
  Administered 2022-09-28: 2 via TOPICAL

## 2022-09-28 MED ORDER — OXYCODONE HCL 5 MG PO TABS
5.0000 mg | ORAL_TABLET | ORAL | 0 refills | Status: AC | PRN
Start: 2022-09-28 — End: 2022-10-05

## 2022-09-28 MED ORDER — BUPIVACAINE-EPINEPHRINE 0.25% -1:200000 IJ SOLN
INTRAMUSCULAR | Status: DC | PRN
  Administered 2022-09-28: 30 mL

## 2022-09-28 MED ORDER — ACETAMINOPHEN 500 MG PO TABS
1000.0000 mg | ORAL_TABLET | Freq: Once | ORAL | Status: AC
  Administered 2022-09-28: 1000 mg via ORAL
  Filled 2022-09-28: qty 2

## 2022-09-28 MED ORDER — ONDANSETRON HCL 4 MG PO TABS: 4.0000 mg | ORAL_TABLET | Freq: Three times a day (TID) | ORAL | 0 refills | Status: AC | PRN

## 2022-09-28 MED ORDER — ONDANSETRON HCL 4 MG/2ML IJ SOLN
INTRAMUSCULAR | Status: DC | PRN
  Administered 2022-09-28: 4 mg via INTRAVENOUS

## 2022-09-28 MED ORDER — MIDAZOLAM HCL 2 MG/2ML IJ SOLN
INTRAMUSCULAR | Status: AC
  Filled 2022-09-28: qty 2

## 2022-09-28 MED ORDER — PHENOL 1.4 % MT LIQD: 1.0000 | OROMUCOSAL | Status: DC | PRN

## 2022-09-28 MED ORDER — MEXILETINE HCL 150 MG PO CAPS
150.0000 mg | ORAL_CAPSULE | Freq: Two times a day (BID) | ORAL | Status: DC
  Administered 2022-09-28 – 2022-09-29 (×2): 150 mg via ORAL
  Filled 2022-09-28 (×2): qty 1

## 2022-09-28 MED ORDER — FINASTERIDE 5 MG PO TABS
5.0000 mg | ORAL_TABLET | Freq: Every evening | ORAL | Status: DC
  Administered 2022-09-28: 5 mg via ORAL
  Filled 2022-09-28: qty 1

## 2022-09-28 MED ORDER — HYDROMORPHONE HCL 1 MG/ML IJ SOLN: 0.5000 mg | INTRAMUSCULAR | Status: DC | PRN

## 2022-09-28 MED ORDER — LORATADINE 10 MG PO TABS
10.0000 mg | ORAL_TABLET | Freq: Every day | ORAL | Status: DC
  Administered 2022-09-29: 10 mg via ORAL
  Filled 2022-09-28: qty 1

## 2022-09-28 MED ORDER — ROPIVACAINE HCL 7.5 MG/ML IJ SOLN
INTRAMUSCULAR | Status: DC | PRN
Start: 2022-09-28 — End: 2022-09-28
  Administered 2022-09-28: 20 mL via PERINEURAL

## 2022-09-28 MED ORDER — FLUTICASONE PROPIONATE 50 MCG/ACT NA SUSP
1.0000 | Freq: Every day | NASAL | Status: DC
  Administered 2022-09-28: 1 via NASAL
  Filled 2022-09-28: qty 16

## 2022-09-28 MED ORDER — SODIUM CHLORIDE (PF) 0.9 % IJ SOLN
INTRAMUSCULAR | Status: AC
  Filled 2022-09-28: qty 50

## 2022-09-28 MED ORDER — ACETAMINOPHEN 500 MG PO TABS
1000.0000 mg | ORAL_TABLET | Freq: Four times a day (QID) | ORAL | Status: AC
  Administered 2022-09-28 – 2022-09-29 (×4): 1000 mg via ORAL
  Filled 2022-09-28 (×4): qty 2

## 2022-09-28 MED ORDER — AMISULPRIDE (ANTIEMETIC) 5 MG/2ML IV SOLN: 10.0000 mg | Freq: Once | INTRAVENOUS | Status: DC | PRN

## 2022-09-28 MED ORDER — CARBIDOPA-LEVODOPA ER 50-200 MG PO TBCR
1.0000 | EXTENDED_RELEASE_TABLET | Freq: Every day | ORAL | Status: DC
  Administered 2022-09-28: 1 via ORAL
  Filled 2022-09-28: qty 1

## 2022-09-28 MED ORDER — MENTHOL 3 MG MT LOZG: 1.0000 | LOZENGE | OROMUCOSAL | Status: DC | PRN

## 2022-09-28 MED ORDER — BUPIVACAINE LIPOSOME 1.3 % IJ SUSP: 20.0000 mL | Freq: Once | INTRAMUSCULAR | Status: DC

## 2022-09-28 MED ORDER — BUPIVACAINE-EPINEPHRINE 0.25% -1:200000 IJ SOLN
INTRAMUSCULAR | Status: AC
  Filled 2022-09-28: qty 1

## 2022-09-28 MED ORDER — DEXAMETHASONE SODIUM PHOSPHATE 10 MG/ML IJ SOLN
8.0000 mg | Freq: Once | INTRAMUSCULAR | Status: AC
  Administered 2022-09-28: 8 mg via INTRAVENOUS

## 2022-09-28 MED ORDER — ACETAMINOPHEN 325 MG PO TABS: 325.0000 mg | ORAL_TABLET | Freq: Four times a day (QID) | ORAL | Status: DC | PRN

## 2022-09-28 MED ORDER — ROPINIROLE HCL 1 MG PO TABS
1.0000 mg | ORAL_TABLET | Freq: Three times a day (TID) | ORAL | Status: DC
  Administered 2022-09-28 – 2022-09-29 (×3): 1 mg via ORAL
  Filled 2022-09-28 (×3): qty 1

## 2022-09-28 MED ORDER — KETOROLAC TROMETHAMINE 15 MG/ML IJ SOLN
7.5000 mg | Freq: Four times a day (QID) | INTRAMUSCULAR | Status: AC
  Administered 2022-09-28 – 2022-09-29 (×4): 7.5 mg via INTRAVENOUS
  Filled 2022-09-28 (×4): qty 1

## 2022-09-28 MED ORDER — PANTOPRAZOLE SODIUM 40 MG PO TBEC
40.0000 mg | DELAYED_RELEASE_TABLET | Freq: Every day | ORAL | Status: DC
  Administered 2022-09-28 – 2022-09-29 (×2): 40 mg via ORAL
  Filled 2022-09-28 (×2): qty 1

## 2022-09-28 MED ORDER — DEXAMETHASONE SODIUM PHOSPHATE 10 MG/ML IJ SOLN
INTRAMUSCULAR | Status: AC
  Filled 2022-09-28: qty 1

## 2022-09-28 MED ORDER — TRANEXAMIC ACID-NACL 1000-0.7 MG/100ML-% IV SOLN
1000.0000 mg | INTRAVENOUS | Status: AC
  Administered 2022-09-28: 1000 mg via INTRAVENOUS
  Filled 2022-09-28: qty 100

## 2022-09-28 MED ORDER — AZELASTINE-FLUTICASONE 137-50 MCG/ACT NA SUSP: 1.0000 | Freq: Every day | NASAL | Status: DC

## 2022-09-28 MED ORDER — MONTELUKAST SODIUM 10 MG PO TABS
10.0000 mg | ORAL_TABLET | Freq: Every day | ORAL | Status: DC
  Administered 2022-09-28: 10 mg via ORAL
  Filled 2022-09-28: qty 1

## 2022-09-28 MED ORDER — LEVOCETIRIZINE DIHYDROCHLORIDE 5 MG PO TABS: 5.0000 mg | ORAL_TABLET | Freq: Every evening | ORAL | Status: DC

## 2022-09-28 MED ORDER — SODIUM CHLORIDE 0.9 % IV SOLN: INTRAVENOUS | Status: DC

## 2022-09-28 MED ORDER — STERILE WATER FOR IRRIGATION IR SOLN
Status: DC | PRN
Start: 2022-09-28 — End: 2022-09-28
  Administered 2022-09-28: 2000 mL

## 2022-09-28 MED ORDER — ORAL CARE MOUTH RINSE: 15.0000 mL | Freq: Once | OROMUCOSAL | Status: AC

## 2022-09-28 MED ORDER — ONDANSETRON HCL 4 MG/2ML IJ SOLN: 4.0000 mg | Freq: Four times a day (QID) | INTRAMUSCULAR | Status: DC | PRN

## 2022-09-28 MED ORDER — 0.9 % SODIUM CHLORIDE (POUR BTL) OPTIME
TOPICAL | Status: DC | PRN
  Administered 2022-09-28: 1000 mL

## 2022-09-28 MED ORDER — METHOCARBAMOL 500 MG IVPB - SIMPLE MED: 500.0000 mg | Freq: Four times a day (QID) | INTRAVENOUS | Status: DC | PRN

## 2022-09-28 MED ORDER — ZOLPIDEM TARTRATE 10 MG PO TABS
10.0000 mg | ORAL_TABLET | Freq: Every day | ORAL | Status: DC
  Administered 2022-09-28: 10 mg via ORAL
  Filled 2022-09-28: qty 1

## 2022-09-28 MED ORDER — FENTANYL CITRATE (PF) 100 MCG/2ML IJ SOLN
INTRAMUSCULAR | Status: AC
  Filled 2022-09-28: qty 2

## 2022-09-28 MED ORDER — DIPHENHYDRAMINE HCL 12.5 MG/5ML PO ELIX: 12.5000 mg | ORAL_SOLUTION | ORAL | Status: DC | PRN

## 2022-09-28 MED ORDER — DOCUSATE SODIUM 100 MG PO CAPS
100.0000 mg | ORAL_CAPSULE | Freq: Two times a day (BID) | ORAL | Status: DC
  Administered 2022-09-28 – 2022-09-29 (×3): 100 mg via ORAL
  Filled 2022-09-28 (×3): qty 1

## 2022-09-28 MED ORDER — FENTANYL CITRATE (PF) 100 MCG/2ML IJ SOLN
INTRAMUSCULAR | Status: DC | PRN
  Administered 2022-09-28: 50 ug via INTRAVENOUS

## 2022-09-28 MED ORDER — ONDANSETRON HCL 4 MG/2ML IJ SOLN
INTRAMUSCULAR | Status: AC
  Filled 2022-09-28: qty 2

## 2022-09-28 MED ORDER — LACTATED RINGERS IV SOLN: INTRAVENOUS | Status: DC

## 2022-09-28 MED ORDER — SODIUM CHLORIDE 0.9 % IR SOLN
Status: DC | PRN
  Administered 2022-09-28: 3000 mL

## 2022-09-28 MED ORDER — TAMSULOSIN HCL 0.4 MG PO CAPS
0.4000 mg | ORAL_CAPSULE | Freq: Every day | ORAL | Status: DC
  Administered 2022-09-28: 0.4 mg via ORAL
  Filled 2022-09-28: qty 1

## 2022-09-28 MED ORDER — CARBIDOPA-LEVODOPA 25-100 MG PO TABS
2.0000 | ORAL_TABLET | ORAL | Status: DC
  Administered 2022-09-28 – 2022-09-29 (×5): 2 via ORAL
  Filled 2022-09-28 (×5): qty 2

## 2022-09-28 MED ORDER — CHLORHEXIDINE GLUCONATE 0.12 % MT SOLN
15.0000 mL | Freq: Once | OROMUCOSAL | Status: AC
  Administered 2022-09-28: 15 mL via OROMUCOSAL

## 2022-09-28 MED ORDER — PROPOFOL 500 MG/50ML IV EMUL
INTRAVENOUS | Status: DC | PRN
  Administered 2022-09-28: 40 ug/kg/min via INTRAVENOUS

## 2022-09-28 MED ORDER — ASPIRIN 81 MG PO CHEW
81.0000 mg | CHEWABLE_TABLET | Freq: Two times a day (BID) | ORAL | Status: DC
  Filled 2022-09-28 (×2): qty 1

## 2022-09-28 MED ORDER — PHENYLEPHRINE HCL-NACL 20-0.9 MG/250ML-% IV SOLN
INTRAVENOUS | Status: DC | PRN
Start: 2022-09-28 — End: 2022-09-28
  Administered 2022-09-28: 25 ug/min via INTRAVENOUS

## 2022-09-28 MED ORDER — OXYCODONE HCL 5 MG PO TABS
5.0000 mg | ORAL_TABLET | ORAL | Status: DC | PRN
  Administered 2022-09-28 (×4): 5 mg via ORAL
  Filled 2022-09-28 (×4): qty 1

## 2022-09-28 MED ORDER — POLYETHYLENE GLYCOL 3350 17 G PO PACK: 17.0000 g | PACK | Freq: Every day | ORAL | Status: DC | PRN

## 2022-09-28 MED ORDER — MIDODRINE HCL 5 MG PO TABS: 5.0000 mg | ORAL_TABLET | Freq: Two times a day (BID) | ORAL | Status: DC | PRN

## 2022-09-28 MED ORDER — HYDROCORTISONE (PERIANAL) 2.5 % EX CREA: 1.0000 | TOPICAL_CREAM | Freq: Two times a day (BID) | CUTANEOUS | Status: DC | PRN

## 2022-09-28 MED ORDER — METHOCARBAMOL 500 MG PO TABS
500.0000 mg | ORAL_TABLET | Freq: Four times a day (QID) | ORAL | Status: DC | PRN
  Administered 2022-09-28: 500 mg via ORAL
  Filled 2022-09-28: qty 1

## 2022-09-28 MED ORDER — BUPIVACAINE LIPOSOME 1.3 % IJ SUSP
INTRAMUSCULAR | Status: AC
  Filled 2022-09-28: qty 20

## 2022-09-28 MED ORDER — CEFAZOLIN SODIUM-DEXTROSE 2-4 GM/100ML-% IV SOLN
2.0000 g | Freq: Four times a day (QID) | INTRAVENOUS | Status: AC
  Administered 2022-09-28 (×2): 2 g via INTRAVENOUS
  Filled 2022-09-28 (×2): qty 100

## 2022-09-28 MED ORDER — ALBUTEROL SULFATE (2.5 MG/3ML) 0.083% IN NEBU: 2.5000 mg | INHALATION_SOLUTION | RESPIRATORY_TRACT | Status: DC | PRN

## 2022-09-28 MED ORDER — VITAMIN D 25 MCG (1000 UNIT) PO TABS
1000.0000 [IU] | ORAL_TABLET | Freq: Every day | ORAL | Status: DC
  Administered 2022-09-29: 1000 [IU] via ORAL
  Filled 2022-09-28: qty 1

## 2022-09-28 MED ORDER — ISOPROPYL ALCOHOL 70 % SOLN
Status: AC
  Filled 2022-09-28: qty 480

## 2022-09-28 MED ORDER — KETOTIFEN FUMARATE 0.035 % OP SOLN: 1.0000 [drp] | Freq: Two times a day (BID) | OPHTHALMIC | Status: DC | PRN

## 2022-09-28 MED ORDER — PROPOFOL 1000 MG/100ML IV EMUL
INTRAVENOUS | Status: AC
  Filled 2022-09-28: qty 100

## 2022-09-28 MED ORDER — FENTANYL CITRATE PF 50 MCG/ML IJ SOSY: 25.0000 ug | PREFILLED_SYRINGE | INTRAMUSCULAR | Status: DC | PRN

## 2022-09-28 MED ORDER — ESCITALOPRAM OXALATE 20 MG PO TABS
20.0000 mg | ORAL_TABLET | Freq: Every day | ORAL | Status: DC
  Administered 2022-09-29: 20 mg via ORAL
  Filled 2022-09-28: qty 1

## 2022-09-28 MED ORDER — BUPIVACAINE IN DEXTROSE 0.75-8.25 % IT SOLN
INTRATHECAL | Status: DC | PRN
  Administered 2022-09-28: 2 mL via INTRATHECAL

## 2022-09-28 MED ORDER — ISOPROPYL ALCOHOL 70 % SOLN
Status: DC | PRN
  Administered 2022-09-28: 1 via TOPICAL

## 2022-09-28 MED ORDER — METOPROLOL SUCCINATE ER 25 MG PO TB24
12.5000 mg | ORAL_TABLET | Freq: Every day | ORAL | Status: DC
  Administered 2022-09-29: 12.5 mg via ORAL
  Filled 2022-09-28: qty 1

## 2022-09-28 SURGICAL SUPPLY — 66 items
ADH SKN CLS APL DERMABOND .7 (GAUZE/BANDAGES/DRESSINGS) ×1
APL PRP STRL LF DISP 70% ISPRP (MISCELLANEOUS) ×2
BAG COUNTER SPONGE SURGICOUNT (BAG) IMPLANT
BAG SPNG CNTER NS LX DISP (BAG)
BLADE SAG 18X100X1.27 (BLADE) ×1 IMPLANT
BLADE SAW SAG 35X64 .89 (BLADE) ×1 IMPLANT
BNDG CMPR 5X3 CHSV STRCH STRL (GAUZE/BANDAGES/DRESSINGS) ×1
BNDG CMPR MED 10X6 ELC LF (GAUZE/BANDAGES/DRESSINGS) ×1
BNDG COHESIVE 3X5 TAN ST LF (GAUZE/BANDAGES/DRESSINGS) ×1 IMPLANT
BNDG ELASTIC 6X10 VLCR STRL LF (GAUZE/BANDAGES/DRESSINGS) ×1 IMPLANT
BOWL SMART MIX CTS (DISPOSABLE) ×1 IMPLANT
BSPLAT TIB 5D G CMNT STM LT (Knees) ×1 IMPLANT
CEMENT BONE R 1X40 (Cement) IMPLANT
CEMENT BONE REFOBACIN R1X40 US (Cement) IMPLANT
CHLORAPREP W/TINT 26 (MISCELLANEOUS) ×2 IMPLANT
CLSR STERI-STRIP ANTIMIC 1/2X4 (GAUZE/BANDAGES/DRESSINGS) IMPLANT
COMP FEM CEMT PERSONA SZ9 LT (Knees) ×1 IMPLANT
COMPONENT FEM CEMT PRNSA SZ9LT (Knees) IMPLANT
COVER SURGICAL LIGHT HANDLE (MISCELLANEOUS) ×1 IMPLANT
CUFF TOURN SGL QUICK 34 (TOURNIQUET CUFF) ×1
CUFF TRNQT CYL 34X4.125X (TOURNIQUET CUFF) ×1 IMPLANT
DERMABOND ADVANCED .7 DNX12 (GAUZE/BANDAGES/DRESSINGS) ×1 IMPLANT
DRAPE INCISE IOBAN 85X60 (DRAPES) ×1 IMPLANT
DRAPE SHEET LG 3/4 BI-LAMINATE (DRAPES) ×1 IMPLANT
DRAPE U-SHAPE 47X51 STRL (DRAPES) ×1 IMPLANT
DRESSING AQUACEL AG SP 3.5X10 (GAUZE/BANDAGES/DRESSINGS) ×1 IMPLANT
DRSG AQUACEL AG ADV 3.5X10 (GAUZE/BANDAGES/DRESSINGS) IMPLANT
DRSG AQUACEL AG SP 3.5X10 (GAUZE/BANDAGES/DRESSINGS) ×1
ELECT REM PT RETURN 15FT ADLT (MISCELLANEOUS) ×1 IMPLANT
GAUZE SPONGE 4X4 12PLY STRL (GAUZE/BANDAGES/DRESSINGS) ×1 IMPLANT
GLOVE BIO SURGEON STRL SZ 6.5 (GLOVE) ×2 IMPLANT
GLOVE BIOGEL PI IND STRL 6.5 (GLOVE) ×1 IMPLANT
GLOVE BIOGEL PI IND STRL 8 (GLOVE) ×1 IMPLANT
GLOVE SURG ORTHO 8.0 STRL STRW (GLOVE) ×2 IMPLANT
GOWN STRL REUS W/ TWL XL LVL3 (GOWN DISPOSABLE) ×2 IMPLANT
GOWN STRL REUS W/TWL XL LVL3 (GOWN DISPOSABLE) ×2
HANDPIECE INTERPULSE COAX TIP (DISPOSABLE) ×1
HOLDER FOLEY CATH W/STRAP (MISCELLANEOUS) ×1 IMPLANT
HOOD PEEL AWAY T7 (MISCELLANEOUS) ×3 IMPLANT
INSERT ARTISURF S8-11 18X22X14 (Insert) IMPLANT
KIT TURNOVER KIT A (KITS) IMPLANT
MANIFOLD NEPTUNE II (INSTRUMENTS) ×1 IMPLANT
MARKER SKIN DUAL TIP RULER LAB (MISCELLANEOUS) ×1 IMPLANT
NS IRRIG 1000ML POUR BTL (IV SOLUTION) ×1 IMPLANT
PACK TOTAL KNEE CUSTOM (KITS) ×1 IMPLANT
PIN DRILL HDLS TROCAR 75 4PK (PIN) IMPLANT
SCREW HEADED 33MM KNEE (MISCELLANEOUS) IMPLANT
SET HNDPC FAN SPRY TIP SCT (DISPOSABLE) ×1 IMPLANT
SOLUTION IRRIG SURGIPHOR (IV SOLUTION) IMPLANT
SPIKE FLUID TRANSFER (MISCELLANEOUS) ×1 IMPLANT
STEM POLY PAT PLY 38M KNEE (Knees) IMPLANT
STEM TIBIA 5 DEG SZ G L KNEE (Knees) IMPLANT
STRIP CLOSURE SKIN 1/2X4 (GAUZE/BANDAGES/DRESSINGS) ×1 IMPLANT
SUT MNCRL AB 3-0 PS2 18 (SUTURE) ×1 IMPLANT
SUT MNCRL AB 4-0 PS2 18 (SUTURE) IMPLANT
SUT STRATAFIX PDO 1 14 VIOLET (SUTURE) ×1
SUT STRATFX PDO 1 14 VIOLET (SUTURE) ×1
SUT VIC AB 2-0 CT2 27 (SUTURE) ×2 IMPLANT
SUT VLOC 180 0 24IN GS25 (SUTURE) ×1 IMPLANT
SUTURE STRATFX PDO 1 14 VIOLET (SUTURE) ×1 IMPLANT
SYR 50ML LL SCALE MARK (SYRINGE) ×1 IMPLANT
TIBIA STEM 5 DEG SZ G L KNEE (Knees) ×1 IMPLANT
TRAY FOLEY MTR SLVR 14FR STAT (SET/KITS/TRAYS/PACK) IMPLANT
TUBE SUCTION HIGH CAP CLEAR NV (SUCTIONS) ×1 IMPLANT
UNDERPAD 30X36 HEAVY ABSORB (UNDERPADS AND DIAPERS) ×1 IMPLANT
WRAP KNEE MAXI GEL POST OP (GAUZE/BANDAGES/DRESSINGS) IMPLANT

## 2022-09-28 NOTE — Plan of Care (Signed)
  Problem: Education: Goal: Knowledge of the prescribed therapeutic regimen will improve Outcome: Progressing   Problem: Activity: Goal: Ability to avoid complications of mobility impairment will improve Outcome: Progressing   Problem: Pain Management: Goal: Pain level will decrease with appropriate interventions Outcome: Progressing   Problem: Skin Integrity: Goal: Will show signs of wound healing Outcome: Progressing   Problem: Education: Goal: Knowledge of General Education information will improve Description: Including pain rating scale, medication(s)/side effects and non-pharmacologic comfort measures Outcome: Progressing   Problem: Nutrition: Goal: Adequate nutrition will be maintained Outcome: Progressing   Problem: Elimination: Goal: Will not experience complications related to urinary retention Outcome: Progressing   Problem: Pain Managment: Goal: General experience of comfort will improve Outcome: Progressing

## 2022-09-28 NOTE — Evaluation (Signed)
Physical Therapy Evaluation Patient Details Name: Joshua [name] MRN: [id number] DOB: 12/02/1945 Today's Date: 09/28/2022  History of Present Illness  77 yo male S/P LTKA 09/28/22. PMH: PD, vertigo, RBBB,CVA/TIA, back surgery  ,"L foot drop"  Clinical Impression  Pt admitted with above diagnosis.  Pt currently with functional limitations due to the deficits listed below (see PT Problem List). Pt will benefit from acute skilled PT to increase their independence and safety with mobility to allow discharge.     The patient  ambulated x 80' using RW. Gait slightly tremulous.  Patient should progress to Dc home  soon.       If plan is discharge home, recommend the following: A little help with walking and/or transfers;A little help with bathing/dressing/bathroom;Assistance with cooking/housework;Assist for transportation;Help with stairs or ramp for entrance   Can travel by private vehicle    yes    Equipment Recommendations None recommended by PT  Recommendations for Other Services       Functional Status Assessment Patient has had a recent decline in their functional status and demonstrates the ability to make significant improvements in function in a reasonable and predictable amount of time.     Precautions / Restrictions Precautions Precautions: Knee;Fall Restrictions Weight Bearing Restrictions: No      Mobility  Bed Mobility Overal bed mobility: Needs Assistance Bed Mobility: Supine to Sit     Supine to sit: Contact guard          Transfers Overall transfer level: Needs assistance Equipment used: Rolling walker (2 wheels) Transfers: Sit to/from Stand Sit to Stand: Min assist           General transfer comment: cues for hand and LLE position    Ambulation/Gait Ambulation/Gait assistance: Min assist Gait Distance (Feet): 80 Feet Assistive device: Rolling walker (2 wheels) Gait Pattern/deviations: Step-to pattern, Step-through pattern Gait  velocity: decr     General Gait Details: mild tremors, gait steady  Stairs            Wheelchair Mobility     Tilt Bed    Modified Rankin (Stroke Patients Only)       Balance Overall balance assessment: Needs assistance Sitting-balance support: No upper extremity supported, Feet supported Sitting balance-Leahy Scale: Good     Standing balance support: Bilateral upper extremity supported, During functional activity, Reliant on assistive device for balance Standing balance-Leahy Scale: Fair                               Pertinent Vitals/Pain Pain Assessment Pain Assessment: 0-10 Pain Score: 4  Pain Location: left knee Pain Descriptors / Indicators: Discomfort Pain Intervention(s): Monitored during session, Premedicated before session, Ice applied    Home Living Family/patient expects to be discharged to:: Private residence Living Arrangements: Spouse/significant other Available Help at Discharge: Family;Available 24 hours/day Type of Home: House Home Access: Level entry       Home Layout: One level Home Equipment: Agricultural consultant (2 wheels);Shower seat;Grab bars - tub/shower      Prior Function Prior Level of Function : Independent/Modified Independent                     Extremity/Trunk Assessment   Upper Extremity Assessment Upper Extremity Assessment: Overall WFL for tasks assessed    Lower Extremity Assessment Lower Extremity Assessment: LLE deficits/detail LLE Deficits / Details: + SLR, knee flexion 10-60, + SLR, decreased LT fooot, decreased  dorsiflexion  LLE Sensation: decreased light touch    Cervical / Trunk Assessment Cervical / Trunk Assessment: Normal  Communication   Communication Communication: No apparent difficulties  Cognition Arousal: Alert Behavior During Therapy: WFL for tasks assessed/performed Overall Cognitive Status: Within Functional Limits for tasks assessed                                           General Comments      Exercises Total Joint Exercises Ankle Circles/Pumps: AROM, Both, 10 reps Quad Sets: AROM, Both, 5 reps Heel Slides: AROM, Left, 5 reps Straight Leg Raises: AROM, Left, 5 reps   Assessment/Plan    PT Assessment Patient needs continued PT services  PT Problem List Decreased strength;Decreased activity tolerance;Decreased mobility;Decreased range of motion;Decreased balance;Decreased knowledge of precautions;Pain       PT Treatment Interventions DME instruction;Therapeutic activities;Gait training;Functional mobility training;Therapeutic exercise;Patient/family education    PT Goals (Current goals can be found in the Care Plan section)  Acute Rehab PT Goals Patient Stated Goal: go home PT Goal Formulation: With patient/family Time For Goal Achievement: 10/05/22 Potential to Achieve Goals: Good    Frequency 7X/week     Co-evaluation               AM-PAC PT "6 Clicks" Mobility  Outcome Measure Help needed turning from your back to your side while in a flat bed without using bedrails?: A Little Help needed moving from lying on your back to sitting on the side of a flat bed without using bedrails?: A Little Help needed moving to and from a bed to a chair (including a wheelchair)?: A Little Help needed standing up from a chair using your arms (e.g., wheelchair or bedside chair)?: A Little Help needed to walk in hospital room?: A Little Help needed climbing 3-5 steps with a railing? : A Little 6 Click Score: 18    End of Session Equipment Utilized During Treatment: Gait belt Activity Tolerance: Patient tolerated treatment well Patient left: in chair;with call bell/phone within reach;with chair alarm set;with family/visitor present Nurse Communication: Mobility status PT Visit Diagnosis: Difficulty in walking, not elsewhere classified (R26.2);Muscle weakness (generalized) (M62.81);Pain Pain - Right/Left: Left Pain - part of body:  Knee    Time: 2440-1027 PT Time Calculation (min) (ACUTE ONLY): 33 min   Charges:   PT Evaluation $PT Eval Low Complexity: 1 Low PT Treatments $Gait Training: 8-22 mins PT General Charges $$ ACUTE PT VISIT: 1 Visit         Blanchard Kelch PT Acute Rehabilitation Services Office [phone] Weekend pager-[phone]   Rada Hay 09/28/2022, 4:39 PM

## 2022-09-28 NOTE — Anesthesia Procedure Notes (Signed)
Anesthesia Regional Block: Adductor canal block   Pre-Anesthetic Checklist: , timeout performed,  Correct Patient, Correct Site, Correct Laterality,  Correct Procedure, Correct Position, site marked,  Risks and benefits discussed,  Surgical consent,  Pre-op evaluation,  At surgeon's request and post-op pain management  Laterality: Left  Prep: Dura Prep       Needles:  Injection technique: Single-shot  Needle Type: Echogenic Stimulator Needle     Needle Length: 10cm  Needle Gauge: 20     Additional Needles:   Procedures:,,,, ultrasound used (permanent image in chart),,    Narrative:  Start time: 09/28/2022 6:55 AM End time: 09/28/2022 7:00 AM Injection made incrementally with aspirations every 5 mL.  Performed by: Personally  Anesthesiologist: Atilano Median, DO  Additional Notes: Patient identified. Risks/Benefits/Options discussed with patient including but not limited to bleeding, infection, nerve damage, failed block, incomplete pain control. Patient expressed understanding and wished to proceed. All questions were answered. Sterile technique was used throughout the entire procedure. Please see nursing notes for vital signs. Aspirated in 5cc intervals with injection for negative confirmation. Patient was given instructions on fall risk and not to get out of bed. All questions and concerns addressed with instructions to call with any issues or inadequate analgesia.

## 2022-09-28 NOTE — Transfer of Care (Signed)
Immediate Anesthesia Transfer of Care Note  Patient: Joshua [name]  Procedure(s) Performed: TOTAL KNEE ARTHROPLASTY (Left: Knee)  Patient Location: PACU  Anesthesia Type:MAC and Spinal  Level of Consciousness: awake and alert  Airway & Oxygen Therapy: Patient Spontanous Breathing and Patient connected to face mask oxygen  Post-op Assessment: Report given to RN and Post -op Vital signs reviewed and stable  Post vital signs: Reviewed and stable  Last Vitals:  Vitals Value Taken Time  BP 130/88 09/28/22 1004  Temp    Pulse 65 09/28/22 1006  Resp 12 09/28/22 1006  SpO2 100 % 09/28/22 1006  Vitals shown include unfiled device data.  Last Pain:  Vitals:   09/28/22 0557  TempSrc:   PainSc: 8          Complications: No notable events documented.

## 2022-09-28 NOTE — Op Note (Signed)
DATE OF SURGERY:  09/28/2022 TIME: 9:33 AM  PATIENT NAME:  Joshua [name]   AGE: 77 y.o.    PRE-OPERATIVE DIAGNOSIS: End-stage left knee osteoarthritis  POST-OPERATIVE DIAGNOSIS:  Same  PROCEDURE: Left total Knee Arthroplasty  SURGEON:  Basilia Stuckert A Arvind Mexicano, MD   ASSISTANT: Kathie Dike, PA-C, present and scrubbed throughout the case, critical for assistance with exposure, retraction, instrumentation, and closure.   OPERATIVE IMPLANTS:  Cemented Zimmer persona size 9 left standard femur, G tibial baseplate, 38 mm patella, 10 mm MC poly Implant Name Type Inv. Item Serial No. Manufacturer Lot No. LRB No. Used Action  CEMENT BONE R 1X40 - [id number] Cement CEMENT BONE R 1X40  [employer]IM[employer]ON(ORTH,TRAU,BIO,SG) [id number] Left 1 Implanted  CEMENT BONE R 1X40 - [id number] Cement CEMENT BONE R 1X40  [employer]IMMER [employer]ON(ORTH,TRAU,BIO,SG) [id number] Left 1 Implanted  STEM POLY PAT PLY 72M KNEE - [id number] Knees STEM POLY PAT PLY 72M KNEE  ZIMMER [employer]ON(ORTH,TRAU,BIO,SG) [id number] Left 1 Implanted  COMP FEM CEMT PERSONA SZ9 LT - [id number] Knees COMP FEM CEMT PERSONA SZ9 LT  ZIMMER [employer]ON(ORTH,TRAU,BIO,SG) [id number] Left 1 Implanted  BSPLAT TIB 5D G CMNT STM LT - [id number] Knees BSPLAT TIB 5D G CMNT STM LT  ZIMMER RECON(ORTH,TRAU,BIO,SG) [id number] Left 1 Implanted  INSERT ARTISURF S8-11 [id number] - [id number] Insert INSERT ARTISURF S8-11 [id number]  ZIMMER [employer]ON(ORTH,TRAU,BIO,SG) [id number] Left 1 Implanted      PREOPERATIVE INDICATIONS:  Joshua [name] is a 77 y.o. year old male with end stage bone on bone degenerative arthritis of the knee who failed conservative treatment, including injections, antiinflammatories, activity modification, and assistive devices, and had significant impairment of their activities of daily living, and elected for Total Knee Arthroplasty.   The risks, benefits, and alternatives were discussed at length including but not limited to the risks of infection, bleeding,  nerve injury, stiffness, blood clots, the need for revision surgery, cardiopulmonary complications, among others, and they were willing to proceed.  ESTIMATED BLOOD LOSS: 50cc  OPERATIVE DESCRIPTION:   Once adequate anesthesia was induced, preoperative antibiotics, 2 gm of ancef,1 gm of Tranexamic Acid, and 8 mg of Decadron administered, the patient was positioned supine with a left thigh tourniquet placed.  The left lower extremity was prepped and draped in sterile fashion.  A time-  out was performed identifying the patient, planned procedure, and the appropriate extremity.     The leg was  exsanguinated, tourniquet elevated to 250 mmHg.  A midline incision was  made followed by median parapatellar arthrotomy. Anterior horn of the medial meniscus was released and resected. A medial release was performed, the infrapatellar fat pad was resected with care taken to protect the patellar tendon. The suprapatellar fat was removed to exposed the distal anterior femur. The anterior horn of the lateral meniscus and ACL were released.    Following initial  exposure, I first started with the femur  The femoral  canal was opened with a drill, canal was suctioned to try to prevent fat emboli.  An  intramedullary rod was passed set at 5 degrees valgus, 10mm. The distal femur was resected.  Following this resection, the tibia was  subluxated anteriorly.  Using the extramedullary guide, 10mm of bone was resected off  the proximal lateral tibia.  We confirmed the gap would be  stable medially and laterally with a size 10mm spacer block as well as confirmed that the tibial cut was perpendicular in the coronal plane, checking with an alignment rod.    Once this was done, the  posterior femoral referencing femoral sizer was placed under to the posterior condyles with 3 degrees of external rotational which was parallel to the transepicondylar axis and perpendicular to [employer]. The femur was sized to be a size 9 in  the anterior-  posterior dimension. The  anterior, posterior, and  chamfer cuts were made without difficulty nor   notching making certain that I was along the anterior cortex to help  with flexion gap stability. Next a laminar spreader was placed with the knee in flexion and the medial lateral menisci were resected.  5 cc of the Exparel mixture was injected in the medial side of the back of the knee and 3 cc in the lateral side.  1/2 inch curved osteotome was used to resect posterior osteophyte that was then removed with a pituitary rongeur.       At this point, the tibia was sized to be a size G.  The size G tray was  then pinned in position. Trial reduction was now carried with a 9 femur, G tibia, a 10 mm MC insert.  The knee was assessed and felt to be tight in both flexion extension with still about a 5 degree flexion contracture and tibia sitting anteriorly in flexion.  Elected to resect additional 4 mm to help loosen up close spaces.  Reinserted the trial components with a 10 mm poly insert.  The knee had full extension and was stable to varus valgus stress in extension.  The knee was slightly tight in flexion and the PCL was partially released.   Attention was next directed to the patella.  Precut  measurement was noted to be 27 mm.  I resected down to 16 mm and used a  38mm patellar button to restore patellar height as well as cover the cut surface.     The patella lug holes were drilled and a 38mm patella poly trial was placed.    The knee was brought to full extension with good flexion stability with the patella tracking through the trochlea without application of pressure.     Next the femoral component was again assessed and determined to be seated and appropriately lateralized.  The femoral lug holes were drilled.  The femoral component was then removed. Tibial component was again assessed and felt to be seated and appropriately rotated with the medial third of the tubercle. The tibia was  then drilled, and keel punched.     Final components were  opened and cement was mixed.      Final implants were then  cemented onto cleaned and dried cut surfaces of bone with the knee brought to extension with a 10mm MC poly.  The knee was irrigated with sterile Betadine diluted in saline as well as pulse lavage normal saline. The synovial lining was  then injected a dilute Exparel with 30cc of 0.25% marcaine with epinephrine.         Once the cement had fully cured, excess cement was removed throughout the knee.  I confirmed that I was satisfied with the range of motion and stability, and the final 10mm MC poly insert was chosen.  It was placed into the knee.         The tourniquet had been let down.  No significant hemostasis was required.  The medial parapatellar arthrotomy was then reapproximated using #1 Stratafix sutures with the knee  in flexion.  The remaining wound was closed with 0 stratafix, 2-0 Vicryl, and running 3-0 Monocryl. The knee  was cleaned, dried, dressed sterilely using Dermabond and  Aquacel dressing.  The patient was then brought to recovery room in stable condition, tolerating the procedure  well. There were no complications.   Post op recs: WB: WBAT Abx: ancef Imaging: PACU xrays DVT prophylaxis: Aspirin 81mg  BID x4 weeks, resume Plavix starting postop day 2. Follow up: 2 weeks after surgery for a wound check with Dr. Blanchie Dessert at [facility].  Address: [address], [place], [place] [address]  Office Phone: [phone]  Weber Cooks, MD Orthopaedic Surgery

## 2022-09-28 NOTE — Anesthesia Procedure Notes (Signed)
Spinal  Patient location during procedure: OR Start time: 09/28/2022 7:35 AM End time: 09/28/2022 7:37 AM Staffing Performed: anesthesiologist  Anesthesiologist: Atilano Median, DO Performed by: Atilano Median, DO Authorized by: Atilano Median, DO   Preanesthetic Checklist Completed: patient identified, IV checked, site marked, risks and benefits discussed, surgical consent, monitors and equipment checked, pre-op evaluation and timeout performed Spinal Block Patient position: sitting Prep: DuraPrep Patient monitoring: heart rate, cardiac monitor, continuous pulse ox and blood pressure Approach: midline Location: L3-4 Injection technique: single-shot Needle Needle type: Pencan  Needle gauge: 24 G Needle length: 12.7 cm Assessment Events: CSF return Additional Notes Patient identified. Risks/Benefits/Options discussed with patient including but not limited to bleeding, infection, nerve damage, paralysis, failed block, incomplete pain control, headache, blood pressure changes, nausea, vomiting, reactions to medications, itching and postpartum back pain. Confirmed with bedside nurse the patient's most recent platelet count. Confirmed with patient that they are not currently taking any anticoagulation, have any bleeding history or any family history of bleeding disorders. Patient expressed understanding and wished to proceed. All questions were answered. Sterile technique was used throughout the entire procedure. Please see nursing notes for vital signs. Warning signs of high block given to the patient including shortness of breath, tingling/numbness in hands, complete motor block, or any concerning symptoms with instructions to call for help. Patient was given instructions on fall risk and not to get out of bed. All questions and concerns addressed with instructions to call with any issues or inadequate analgesia.

## 2022-09-28 NOTE — Anesthesia Procedure Notes (Signed)
Procedure Name: MAC Date/Time: 09/28/2022 7:29 AM  Performed by: Elisabeth Cara, CRNAPre-anesthesia Checklist: Patient identified, Emergency Drugs available, Suction available, Patient being monitored and Timeout performed Oxygen Delivery Method: Simple face mask Placement Confirmation: positive ETCO2 Dental Injury: Teeth and Oropharynx as per pre-operative assessment

## 2022-09-28 NOTE — Progress Notes (Signed)
Orthopedic Tech Progress Note Patient Details:  Joshua [name] 1945-09-28 [id number]  Ortho Devices Type of Ortho Device: Bone foam zero knee Ortho Device/Splint Interventions: Ordered, Application   Post Interventions Patient Tolerated: Well Instructions Provided: Other (comment) (Wear while tolerated)  [name] 09/28/2022, 10:59 AM

## 2022-09-28 NOTE — Discharge Instructions (Addendum)
INSTRUCTIONS AFTER JOINT REPLACEMENT   Remove items at home which could result in a fall. This includes throw rugs or furniture in walking pathways ICE to the affected joint every three hours while awake for 30 minutes at a time, for at least the first 3-5 days, and then as needed for pain and swelling.  Continue to use ice for pain and swelling. You may notice swelling that will progress down to the foot and ankle.  This is normal after surgery.  Elevate your leg when you are not up walking on it.   Continue to use the breathing machine you got in the hospital (incentive spirometer) which will help keep your temperature down.  It is common for your temperature to cycle up and down following surgery, especially at night when you are not up moving around and exerting yourself.  The breathing machine keeps your lungs expanded and your temperature down.   DIET:  As you were doing prior to hospitalization, we recommend a well-balanced diet.  DRESSING / WOUND CARE / SHOWERING  Keep the surgical dressing until follow up.  The dressing is water proof, so you can shower without any extra covering.  IF THE DRESSING FALLS OFF or the wound gets wet inside, change the dressing with sterile gauze.  Please use good hand washing techniques before changing the dressing.  Do not use any lotions or creams on the incision until instructed by your surgeon.    ACTIVITY  Increase activity slowly as tolerated, but follow the weight bearing instructions below.   No driving for 6 weeks or until further direction given by your physician.  You cannot drive while taking narcotics.  No lifting or carrying greater than 10 lbs. until further directed by your surgeon. Avoid periods of inactivity such as sitting longer than an hour when not asleep. This helps prevent blood clots.  You may return to work once you are authorized by your doctor.     WEIGHT BEARING   Weight bearing as tolerated with assist device (walker, cane,  etc) as directed, use it as long as suggested by your surgeon or therapist, typically at least 4-6 weeks.   EXERCISES  Results after joint replacement surgery are often greatly improved when you follow the exercise, range of motion and muscle strengthening exercises prescribed by your doctor. Safety measures are also important to protect the joint from further injury. Any time any of these exercises cause you to have increased pain or swelling, decrease what you are doing until you are comfortable again and then slowly increase them. If you have problems or questions, call your caregiver or physical therapist for advice.   Rehabilitation is important following a joint replacement. After just a few days of immobilization, the muscles of the leg can become weakened and shrink (atrophy).  These exercises are designed to build up the tone and strength of the thigh and leg muscles and to improve motion. Often times heat used for twenty to thirty minutes before working out will loosen up your tissues and help with improving the range of motion but do not use heat for the first two weeks following surgery (sometimes heat can increase post-operative swelling).   These exercises can be done on a training (exercise) mat, on the floor, on a table or on a bed. Use whatever works the best and is most comfortable for you.    Use music or television while you are exercising so that the exercises are a pleasant break in your  day. This will make your life better with the exercises acting as a break in your routine that you can look forward to.   Perform all exercises about fifteen times, three times per day or as directed.  You should exercise both the operative leg and the other leg as well.  Exercises include:   Quad Sets - Tighten up the muscle on the front of the thigh (Quad) and hold for 5-10 seconds.   Straight Leg Raises - With your knee straight (if you were given a brace, keep it on), lift the leg to 60  degrees, hold for 3 seconds, and slowly lower the leg.  Perform this exercise against resistance later as your leg gets stronger.  Leg Slides: Lying on your back, slowly slide your foot toward your buttocks, bending your knee up off the floor (only go as far as is comfortable). Then slowly slide your foot back down until your leg is flat on the floor again.  Angel Wings: Lying on your back spread your legs to the side as far apart as you can without causing discomfort.  Hamstring Strength:  Lying on your back, push your heel against the floor with your leg straight by tightening up the muscles of your buttocks.  Repeat, but this time bend your knee to a comfortable angle, and push your heel against the floor.  You may put a pillow under the heel to make it more comfortable if necessary.   A rehabilitation program following joint replacement surgery can speed recovery and prevent re-injury in the future due to weakened muscles. Contact your doctor or a physical therapist for more information on knee rehabilitation.    CONSTIPATION  Constipation is defined medically as fewer than three stools per week and severe constipation as less than one stool per week.  Even if you have a regular bowel pattern at home, your normal regimen is likely to be disrupted due to multiple reasons following surgery.  Combination of anesthesia, postoperative narcotics, change in appetite and fluid intake all can affect your bowels.   YOU MUST use at least one of the following options; they are listed in order of increasing strength to get the job done.  They are all available over the counter, and you may need to use some, POSSIBLY even all of these options:    Drink plenty of fluids (prune juice may be helpful) and high fiber foods Colace 100 mg by mouth twice a day  Senokot for constipation as directed and as needed Dulcolax (bisacodyl), take with full glass of water  Miralax (polyethylene glycol) once or twice a day as  needed.  If you have tried all these things and are unable to have a bowel movement in the first 3-4 days after surgery call either your surgeon or your primary doctor.    If you experience loose stools or diarrhea, hold the medications until you stool forms back up.  If your symptoms do not get better within 1 week or if they get worse, check with your doctor.  If you experience "the worst abdominal pain ever" or develop nausea or vomiting, please contact the office immediately for further recommendations for treatment.   ITCHING:  If you experience itching with your medications, try taking only a single pain pill, or even half a pain pill at a time.  You can also use Benadryl over the counter for itching or also to help with sleep.   TED HOSE STOCKINGS:  Use stockings on both  legs until for at least 2 weeks or as directed by physician office. They may be removed at night for sleeping.  MEDICATIONS:  See your medication summary on the "After Visit Summary" that nursing will review with you.  You may have some home medications which will be placed on hold until you complete the course of blood thinner medication.  It is important for you to complete the blood thinner medication as prescribed.   Blood clot prevention (DVT Prophylaxis): After surgery you are at an increased risk for a blood clot. You were prescribed Aspirin 81mg , to be taken twice daily in addition to your daily Plavix for a total of 4 weeks from surgery to help reduce your risk of getting a blood clot. After the 4 weeks, you may stop the Aspirin and continue with your normal Plavix as prescribed.  This will help prevent a blood clot. Signs of a pulmonary embolus (blood clot in the lungs) include sudden short of breath, feeling lightheaded or dizzy, chest pain with a deep breath, rapid pulse rapid breathing. Signs of a blood clot in your arms or legs include new unexplained swelling and cramping, warm, red or darkened skin around the  painful area. Please call the office or 911 right away if these signs or symptoms develop.  PRECAUTIONS:  If you experience chest pain or shortness of breath - call 911 immediately for transfer to the hospital emergency department.   If you develop a fever greater that 101 F, purulent drainage from wound, increased redness or drainage from wound, foul odor from the wound/dressing, or calf pain - CONTACT YOUR SURGEON.                                                   FOLLOW-UP APPOINTMENTS:  If you do not already have a post-op appointment, please call the office for an appointment to be seen by your surgeon.  Guidelines for how soon to be seen are listed in your "After Visit Summary", but are typically between 2-3 weeks after surgery.  OTHER INSTRUCTIONS:   Knee Replacement:  Do not place pillow under knee, focus on keeping the knee straight while resting.  Place foam block, curve side up under heel at all times except when walking.  DO NOT modify, tear, cut, or change the foam block in any way.  POST-OPERATIVE OPIOID TAPER INSTRUCTIONS: It is important to wean off of your opioid medication as soon as possible. If you do not need pain medication after your surgery it is ok to stop day one. Opioids include: Codeine, Hydrocodone(Norco, Vicodin), Oxycodone(Percocet, oxycontin) and hydromorphone amongst others.  Long term and even short term use of opiods can cause: Increased pain response Dependence Constipation Depression Respiratory depression And more.  Withdrawal symptoms can include Flu like symptoms Nausea, vomiting And more Techniques to manage these symptoms Hydrate well Eat regular healthy meals Stay active Use relaxation techniques(deep breathing, meditating, yoga) Do Not substitute Alcohol to help with tapering If you have been on opioids for less than two weeks and do not have pain than it is ok to stop all together.  Plan to wean off of opioids This plan should start  within one week post op of your joint replacement. Maintain the same interval or time between taking each dose and first decrease the dose.  Cut the total daily  intake of opioids by one tablet each day Next start to increase the time between doses. The last dose that should be eliminated is the evening dose.   MAKE SURE YOU:  Understand these instructions.  Get help right away if you are not doing well or get worse.    Thank you for letting us be a part of your medical care team.  It is a privilege we respect greatly.  We hope these instructions will help you stay on track for a fast and full recovery!

## 2022-09-28 NOTE — Interval H&P Note (Signed)
The patient has been re-examined, and the chart reviewed, and there have been no interval changes to the documented history and physical.    Plan for L TKA for Left knee OA  The operative side was examined and the patient was confirmed to have sensation to DPN, SPN, TN intact, Motor EHL, ext, flex 5/5, and DP 2+, PT 2+, No significant edema.   The risks, benefits, and alternatives have been discussed at length with patient, and the patient is willing to proceed.  Left knee marked. Consent has been signed.

## 2022-09-29 ENCOUNTER — Encounter (HOSPITAL_COMMUNITY): Payer: Self-pay | Admitting: Orthopedic Surgery

## 2022-09-29 DIAGNOSIS — M1712 Unilateral primary osteoarthritis, left knee: Secondary | ICD-10-CM | POA: Diagnosis not present

## 2022-09-29 LAB — BASIC METABOLIC PANEL
Anion gap: 8 (ref 5–15)
BUN: 20 mg/dL (ref 8–23)
CO2: 25 mmol/L (ref 22–32)
Calcium: 8.7 mg/dL — ABNORMAL LOW (ref 8.9–10.3)
Chloride: 103 mmol/L (ref 98–111)
Creatinine, Ser: 0.83 mg/dL (ref 0.61–1.24)
GFR, Estimated: >60 mL/min (ref >60)
Glucose, Bld: 145 mg/dL — ABNORMAL HIGH (ref 70–99)
Potassium: 3.9 mmol/L (ref 3.5–5.1)
Sodium: 136 mmol/L (ref 135–145)

## 2022-09-29 LAB — CBC
HCT: 37.4 % — ABNORMAL LOW (ref 39.0–52.0)
Hemoglobin: 12.2 g/dL — ABNORMAL LOW (ref 13.0–17.0)
MCH: 31 pg (ref 26.0–34.0)
MCHC: 32.6 g/dL (ref 30.0–36.0)
MCV: 94.9 fL (ref 80.0–100.0)
Platelets: 193 10*3/uL (ref 150–400)
RBC: 3.94 MIL/uL — ABNORMAL LOW (ref 4.22–5.81)
RDW: 13.4 % (ref 11.5–15.5)
WBC: 12.7 10*3/uL — ABNORMAL HIGH (ref 4.0–10.5)
nRBC: 0 % (ref 0.0–0.2)

## 2022-09-29 NOTE — Anesthesia Postprocedure Evaluation (Signed)
Anesthesia Post Note  Patient: Slaten Kildow  Procedure(s) Performed: TOTAL KNEE ARTHROPLASTY (Left: Knee)     Patient location during evaluation: PACU Anesthesia Type: Regional, MAC and Spinal Level of consciousness: oriented and awake and alert Pain management: pain level controlled Vital Signs Assessment: post-procedure vital signs reviewed and stable Respiratory status: spontaneous breathing, respiratory function stable and patient connected to nasal cannula oxygen Cardiovascular status: blood pressure returned to baseline and stable Postop Assessment: no headache, no backache and no apparent nausea or vomiting Anesthetic complications: no   No notable events documented.  Last Vitals:  Vitals:   09/29/22 0643 09/29/22 0945  BP: (!) 133/99 (!) 146/85  Pulse: 69 78  Resp: 18 17  Temp: 36.9 C 36.9 C  SpO2: 95% 96%    Last Pain:  Vitals:   09/29/22 0737  TempSrc:   PainSc: 0-No pain                 Earl Lites P Welda Azzarello

## 2022-09-29 NOTE — Plan of Care (Signed)
  Problem: Education: Goal: Knowledge of the prescribed therapeutic regimen will improve Outcome: Progressing Goal: Individualized Educational Video(s) Outcome: Progressing   Problem: Activity: Goal: Ability to avoid complications of mobility impairment will improve Outcome: Progressing Goal: Range of joint motion will improve Outcome: Progressing   Problem: Clinical Measurements: Goal: Postoperative complications will be avoided or minimized Outcome: Progressing   Problem: Pain Management: Goal: Pain level will decrease with appropriate interventions Outcome: Progressing   

## 2022-09-29 NOTE — Discharge Summary (Signed)
Physician Discharge Summary  Patient ID: Joshua [name] MRN: [id number] DOB/AGE: 1945-04-02 77 y.o.  Admit date: 09/28/2022 Discharge date: 09/29/2022  Admission Diagnoses:  Primary osteoarthritis of left knee  Discharge Diagnoses:  Principal Problem:   Primary osteoarthritis of left knee   Past Medical History:  Diagnosis Date   Allergic rhinitis    Anxiety    from chronic pain from surgery- on Cymbalta   Arthritis    Asthma    Barrett's esophagus 03/29/2014   Carotid artery disease (HCC)    Carotid Doppler normal August, 2007   Diverticulosis    Dyslipidemia    GERD (gastroesophageal reflux disease)    History of loop recorder    has since 04/04/15   HTN (hypertension)    takes Metoprolol for PVC control   Hx of colonic polyps    adenomatous   IFG (impaired fasting glucose)    Myocardial infarction (HCC)    mild   Palpitations    Benign PVCs   Parkinson disease    Prostate cancer (HCC)    RBBB (right bundle branch block)    rate related   Shingles    TIA (transient ischemic attack) 02/2015   Per pt, had 2 strokes   Vertigo     Surgeries: Procedure(s): TOTAL KNEE ARTHROPLASTY on 09/28/2022   Consultants (if any):   Discharged Condition: Improved  Hospital Course: Joshua [name] is an 77 y.o. male who was admitted 09/28/2022 with a diagnosis of Primary osteoarthritis of left knee and went to the operating room on 09/28/2022 and underwent the above named procedures.    He was given perioperative antibiotics:  Anti-infectives (From admission, onward)    Start     Dose/Rate Route Frequency Ordered Stop   09/28/22 1030  ceFAZolin (ANCEF) IVPB 2g/100 mL premix        2 g 200 mL/hr over 30 Minutes Intravenous Every 6 hours 09/28/22 1026 09/28/22 1812   09/28/22 0600  ceFAZolin (ANCEF) IVPB 2g/100 mL premix        2 g 200 mL/hr over 30 Minutes Intravenous On call to O.R. 09/28/22 0530 09/28/22 [id number]     .  He was given sequential compression  devices, early ambulation, and aspirin for DVT prophylaxis. Plan to resume plavix POD2.  He benefited maximally from the hospital stay and there were no complications.    Recent vital signs:  Vitals:   09/29/22 0152 09/29/22 0643  BP: (!) 142/84 (!) 133/99  Pulse: 70 69  Resp: 18 18  Temp: 98.1 F (36.7 C) 98.5 F (36.9 C)  SpO2: 94% 95%    Recent laboratory studies:  Lab Results  Component Value Date   HGB 12.2 (L) 09/29/2022   HGB 14.0 09/17/2022   HGB 12.7 (L) 05/31/2022   Lab Results  Component Value Date   WBC 12.7 (H) 09/29/2022   PLT 193 09/29/2022   Lab Results  Component Value Date   INR 1.0 02/05/2021   Lab Results  Component Value Date   NA 136 09/29/2022   K 3.9 09/29/2022   CL 103 09/29/2022   CO2 25 09/29/2022   BUN 20 09/29/2022   CREATININE 0.83 09/29/2022   GLUCOSE 145 (H) 09/29/2022    Discharge Medications:   Allergies as of 09/29/2022       Reactions   Floxin I.v. In Dextrose 5% [ofloxacin] Other (See Comments)   Lowers BP   Terazosin Other (See Comments)   Lower bp  Medication List     STOP taking these medications    traMADol 50 MG tablet Commonly known as: ULTRAM       TAKE these medications    acetaminophen 500 MG tablet Commonly known as: TYLENOL Take 2 tablets (1,000 mg total) by mouth every 8 (eight) hours as needed.   albuterol 108 (90 Base) MCG/ACT inhaler Commonly known as: VENTOLIN HFA Inhale 2 puffs into the lungs every 4 (four) hours as needed for wheezing or shortness of breath.   aspirin EC 81 MG tablet Take 1 tablet (81 mg total) by mouth 2 (two) times daily for 28 days. Swallow whole.   azelastine 0.05 % ophthalmic solution Commonly known as: OPTIVAR Apply 1 drop to eye 2 (two) times daily.   BIOFREEZE EX Apply 1 Application topically daily as needed (pain).   Biotin 91478 MCG Tabs Take 1,000 mcg by mouth in the morning.   carbidopa-levodopa 25-100 MG tablet Commonly known as: SINEMET  IR 2 TABLETS AT 7 AM, 2 TABLETS AT 10 AM,2 TABLETS AT 1PM,2 TABLETS AT 4 PM /2 TABLETS AT 7 pm   carbidopa-levodopa 50-200 MG tablet Commonly known as: SINEMET CR TAKE 1 TABLET BY MOUTH EVERYDAY AT BEDTIME   celecoxib 100 MG capsule Commonly known as: CeleBREX Take 1 capsule (100 mg total) by mouth 2 (two) times daily for 14 days.   cholecalciferol 25 MCG (1000 UNIT) tablet Commonly known as: VITAMIN D3 Take 1,000 Units by mouth daily.   clopidogrel 75 MG tablet Commonly known as: Plavix Take 1 tablet (75 mg total) by mouth daily. What changed: when to take this   cyanocobalamin 1000 MCG tablet Commonly known as: VITAMIN B12 Take 1,000 mcg by mouth daily.   Dymista 137-50 MCG/ACT Susp Generic drug: Azelastine-Fluticasone Place 1 puff into both nostrils at bedtime.   escitalopram 20 MG tablet Commonly known as: LEXAPRO Take 20 mg by mouth daily.   finasteride 5 MG tablet Commonly known as: PROSCAR Take 5 mg by mouth every evening.   Fish Oil 1000 MG Caps Take 1,000 mg by mouth in the morning.   lansoprazole 30 MG capsule Commonly known as: PREVACID Take 30 mg by mouth 2 (two) times daily. Morning & mid-afternoon   levocetirizine 5 MG tablet Commonly known as: XYZAL Take 5 mg by mouth every evening.   methocarbamol 500 MG tablet Commonly known as: ROBAXIN Take 1 tablet (500 mg total) by mouth every 8 (eight) hours as needed for up to 10 days for muscle spasms.   metoprolol succinate 25 MG 24 hr tablet Commonly known as: TOPROL-XL TAKE 1/2 TABLET BY MOUTH DAILY What changed: when to take this   mexiletine 150 MG capsule Commonly known as: MEXITIL TAKE 1 CAPSULE BY MOUTH TWICE A DAY   midodrine 5 MG tablet Commonly known as: PROAMATINE TAKE 1 TABLET BY MOUTH 2 TIMES DAILY AS NEEDED. TAKE IF SYSTOLIC BLOOD PRESSURE GETS BELOW 100 What changed: See the new instructions.   montelukast 10 MG tablet Commonly known as: SINGULAIR Take 10 mg by mouth at  bedtime.   nitroGLYCERIN 0.4 MG SL tablet Commonly known as: NITROSTAT Place 1 tablet (0.4 mg total) under the tongue every 5 (five) minutes as needed for chest pain (Do not give more than 3 SL tablets in 15 minutes.).   ondansetron 4 MG tablet Commonly known as: Zofran Take 1 tablet (4 mg total) by mouth every 8 (eight) hours as needed for up to 14 days for nausea or vomiting.  oxyCODONE 5 MG immediate release tablet Commonly known as: Roxicodone Take 1 tablet (5 mg total) by mouth every 4 (four) hours as needed for up to 7 days for severe pain or moderate pain.   polyethylene glycol 17 g packet Commonly known as: MiraLax Take 17 g by mouth daily.   Proctosol HC 2.5 % rectal cream Generic drug: hydrocortisone APPLY INTO AND AROUND RECTUM 2 TIMES A DAY What changed: See the new instructions.   rOPINIRole 1 MG tablet Commonly known as: REQUIP Take 1 tablet (1 mg total) by mouth 3 (three) times daily.   rosuvastatin 20 MG tablet Commonly known as: CRESTOR Take 1 tablet (20 mg total) by mouth at bedtime.   tamsulosin 0.4 MG Caps capsule Commonly known as: FLOMAX Take 0.4 mg by mouth daily after supper.   zolpidem 10 MG tablet Commonly known as: AMBIEN Take 10 mg by mouth at bedtime.        Diagnostic Studies: DG Knee Left Port  Result Date: 09/28/2022 CLINICAL DATA:  Postop left knee. EXAM: PORTABLE LEFT KNEE - 1-2 VIEW COMPARISON:  None Available. FINDINGS: Left knee arthroplasty in expected alignment. No periprosthetic lucency or fracture. There has been patellar resurfacing. Recent postsurgical change includes air and edema in the soft tissues and joint space. IMPRESSION: Left knee arthroplasty without immediate postoperative complication. Electronically Signed   By: Narda Rutherford M.D.   On: 09/28/2022 12:42    Disposition: Discharge disposition: 01-Home or Self Care       Discharge Instructions     Call MD / Call 911   Complete by: As directed    If you  experience chest pain or shortness of breath, CALL 911 and be transported to the hospital emergency room.  If you develope a fever above 101 F, pus (white drainage) or increased drainage or redness at the wound, or calf pain, call your surgeon's office.   Constipation Prevention   Complete by: As directed    Drink plenty of fluids.  Prune juice may be helpful.  You may use a stool softener, such as Colace (over the counter) 100 mg twice a day.  Use MiraLax (over the counter) for constipation as needed.   Diet - low sodium heart healthy   Complete by: As directed    Increase activity slowly as tolerated   Complete by: As directed    Post-operative opioid taper instructions:   Complete by: As directed    POST-OPERATIVE OPIOID TAPER INSTRUCTIONS: It is important to wean off of your opioid medication as soon as possible. If you do not need pain medication after your surgery it is ok to stop day one. Opioids include: Codeine, Hydrocodone(Norco, Vicodin), Oxycodone(Percocet, oxycontin) and hydromorphone amongst others.  Long term and even short term use of opiods can cause: Increased pain response Dependence Constipation Depression Respiratory depression And more.  Withdrawal symptoms can include Flu like symptoms Nausea, vomiting And more Techniques to manage these symptoms Hydrate well Eat regular healthy meals Stay active Use relaxation techniques(deep breathing, meditating, yoga) Do Not substitute Alcohol to help with tapering If you have been on opioids for less than two weeks and do not have pain than it is ok to stop all together.  Plan to wean off of opioids This plan should start within one week post op of your joint replacement. Maintain the same interval or time between taking each dose and first decrease the dose.  Cut the total daily intake of opioids by one tablet each day  Next start to increase the time between doses. The last dose that should be eliminated is the  evening dose.           Follow-up Information     Joen Laura, MD Follow up in 2 week(s).   Specialty: Orthopedic Surgery Contact information: [address] [place] [address] [phone]                    Discharge Instructions      INSTRUCTIONS AFTER JOINT REPLACEMENT   Remove items at home which could result in a fall. This includes throw rugs or furniture in walking pathways ICE to the affected joint every three hours while awake for 30 minutes at a time, for at least the first 3-5 days, and then as needed for pain and swelling.  Continue to use ice for pain and swelling. You may notice swelling that will progress down to the foot and ankle.  This is normal after surgery.  Elevate your leg when you are not up walking on it.   Continue to use the breathing machine you got in the hospital (incentive spirometer) which will help keep your temperature down.  It is common for your temperature to cycle up and down following surgery, especially at night when you are not up moving around and exerting yourself.  The breathing machine keeps your lungs expanded and your temperature down.   DIET:  As you were doing prior to hospitalization, we recommend a well-balanced diet.  DRESSING / WOUND CARE / SHOWERING  Keep the surgical dressing until follow up.  The dressing is water proof, so you can shower without any extra covering.  IF THE DRESSING FALLS OFF or the wound gets wet inside, change the dressing with sterile gauze.  Please use good hand washing techniques before changing the dressing.  Do not use any lotions or creams on the incision until instructed by your surgeon.    ACTIVITY  Increase activity slowly as tolerated, but follow the weight bearing instructions below.   No driving for 6 weeks or until further direction given by your physician.  You cannot drive while taking narcotics.  No lifting or carrying greater than 10 lbs. until further  directed by your surgeon. Avoid periods of inactivity such as sitting longer than an hour when not asleep. This helps prevent blood clots.  You may return to work once you are authorized by your doctor.     WEIGHT BEARING   Weight bearing as tolerated with assist device (walker, cane, etc) as directed, use it as long as suggested by your surgeon or therapist, typically at least 4-6 weeks.   EXERCISES  Results after joint replacement surgery are often greatly improved when you follow the exercise, range of motion and muscle strengthening exercises prescribed by your doctor. Safety measures are also important to protect the joint from further injury. Any time any of these exercises cause you to have increased pain or swelling, decrease what you are doing until you are comfortable again and then slowly increase them. If you have problems or questions, call your caregiver or physical therapist for advice.   Rehabilitation is important following a joint replacement. After just a few days of immobilization, the muscles of the leg can become weakened and shrink (atrophy).  These exercises are designed to build up the tone and strength of the thigh and leg muscles and to improve motion. Often times heat used for twenty to thirty minutes  before working out will loosen up your tissues and help with improving the range of motion but do not use heat for the first two weeks following surgery (sometimes heat can increase post-operative swelling).   These exercises can be done on a training (exercise) mat, on the floor, on a table or on a bed. Use whatever works the best and is most comfortable for you.    Use music or television while you are exercising so that the exercises are a pleasant break in your day. This will make your life better with the exercises acting as a break in your routine that you can look forward to.   Perform all exercises about fifteen times, three times per day or as directed.  You should  exercise both the operative leg and the other leg as well.  Exercises include:   Quad Sets - Tighten up the muscle on the front of the thigh (Quad) and hold for 5-10 seconds.   Straight Leg Raises - With your knee straight (if you were given a brace, keep it on), lift the leg to 60 degrees, hold for 3 seconds, and slowly lower the leg.  Perform this exercise against resistance later as your leg gets stronger.  Leg Slides: Lying on your back, slowly slide your foot toward your buttocks, bending your knee up off the floor (only go as far as is comfortable). Then slowly slide your foot back down until your leg is flat on the floor again.  Angel Wings: Lying on your back spread your legs to the side as far apart as you can without causing discomfort.  Hamstring Strength:  Lying on your back, push your heel against the floor with your leg straight by tightening up the muscles of your buttocks.  Repeat, but this time bend your knee to a comfortable angle, and push your heel against the floor.  You may put a pillow under the heel to make it more comfortable if necessary.   A rehabilitation program following joint replacement surgery can speed recovery and prevent re-injury in the future due to weakened muscles. Contact your doctor or a physical therapist for more information on knee rehabilitation.    CONSTIPATION  Constipation is defined medically as fewer than three stools per week and severe constipation as less than one stool per week.  Even if you have a regular bowel pattern at home, your normal regimen is likely to be disrupted due to multiple reasons following surgery.  Combination of anesthesia, postoperative narcotics, change in appetite and fluid intake all can affect your bowels.   YOU MUST use at least one of the following options; they are listed in order of increasing strength to get the job done.  They are all available over the counter, and you may need to use some, POSSIBLY even all of  these options:    Drink plenty of fluids (prune juice may be helpful) and high fiber foods Colace 100 mg by mouth twice a day  Senokot for constipation as directed and as needed Dulcolax (bisacodyl), take with full glass of water  Miralax (polyethylene glycol) once or twice a day as needed.  If you have tried all these things and are unable to have a bowel movement in the first 3-4 days after surgery call either your surgeon or your primary doctor.    If you experience loose stools or diarrhea, hold the medications until you stool forms back up.  If your symptoms do not get better within 1  week or if they get worse, check with your doctor.  If you experience "the worst abdominal pain ever" or develop nausea or vomiting, please contact the office immediately for further recommendations for treatment.   ITCHING:  If you experience itching with your medications, try taking only a single pain pill, or even half a pain pill at a time.  You can also use Benadryl over the counter for itching or also to help with sleep.   TED HOSE STOCKINGS:  Use stockings on both legs until for at least 2 weeks or as directed by physician office. They may be removed at night for sleeping.  MEDICATIONS:  See your medication summary on the "After Visit Summary" that nursing will review with you.  You may have some home medications which will be placed on hold until you complete the course of blood thinner medication.  It is important for you to complete the blood thinner medication as prescribed.   Blood clot prevention (DVT Prophylaxis): After surgery you are at an increased risk for a blood clot. You were prescribed Aspirin 81mg , to be taken twice daily in addition to your daily Plavix for a total of 4 weeks from surgery to help reduce your risk of getting a blood clot. After the 4 weeks, you may stop the Aspirin and continue with your normal Plavix as prescribed.  This will help prevent a blood clot. Signs of a  pulmonary embolus (blood clot in the lungs) include sudden short of breath, feeling lightheaded or dizzy, chest pain with a deep breath, rapid pulse rapid breathing. Signs of a blood clot in your arms or legs include new unexplained swelling and cramping, warm, red or darkened skin around the painful area. Please call the office or 911 right away if these signs or symptoms develop.  PRECAUTIONS:  If you experience chest pain or shortness of breath - call 911 immediately for transfer to the hospital emergency department.   If you develop a fever greater that 101 F, purulent drainage from wound, increased redness or drainage from wound, foul odor from the wound/dressing, or calf pain - CONTACT YOUR SURGEON.                                                   FOLLOW-UP APPOINTMENTS:  If you do not already have a post-op appointment, please call the office for an appointment to be seen by your surgeon.  Guidelines for how soon to be seen are listed in your "After Visit Summary", but are typically between 2-3 weeks after surgery.  OTHER INSTRUCTIONS:   Knee Replacement:  Do not place pillow under knee, focus on keeping the knee straight while resting.  Place foam block, curve side up under heel at all times except when walking.  DO NOT modify, tear, cut, or change the foam block in any way.  POST-OPERATIVE OPIOID TAPER INSTRUCTIONS: It is important to wean off of your opioid medication as soon as possible. If you do not need pain medication after your surgery it is ok to stop day one. Opioids include: Codeine, Hydrocodone(Norco, Vicodin), Oxycodone(Percocet, oxycontin) and hydromorphone amongst others.  Long term and even short term use of opiods can cause: Increased pain response Dependence Constipation Depression Respiratory depression And more.  Withdrawal symptoms can include Flu like symptoms Nausea, vomiting And more Techniques to manage these  symptoms Hydrate well Eat regular healthy  meals Stay active Use relaxation techniques(deep breathing, meditating, yoga) Do Not substitute Alcohol to help with tapering If you have been on opioids for less than two weeks and do not have pain than it is ok to stop all together.  Plan to wean off of opioids This plan should start within one week post op of your joint replacement. Maintain the same interval or time between taking each dose and first decrease the dose.  Cut the total daily intake of opioids by one tablet each day Next start to increase the time between doses. The last dose that should be eliminated is the evening dose.   MAKE SURE YOU:  Understand these instructions.  Get help right away if you are not doing well or get worse.    Thank you for letting us be a part of your medical care team.  It is a privilege we respect greatly.  We hope these instructions will help you stay on track for a fast and full recovery!            Signed: Tashay Bozich A Daziyah Cogan 09/29/2022, 6:53 AM

## 2022-09-29 NOTE — Care Management Obs Status (Signed)
MEDICARE OBSERVATION STATUS NOTIFICATION   Patient Details  Name: Horice Borseth MRN: [id number] Date of Birth: 06/24/1945   Medicare Observation Status Notification Given:  Yes    Ewing Schlein, LCSW 09/29/2022, 11:17 AM

## 2022-09-29 NOTE — Progress Notes (Signed)
Physical Therapy Treatment Patient Details Name: Joshua [name] MRN: [id number] DOB: 1945/07/17 Today's Date: 09/29/2022   History of Present Illness 77 yo male S/P LTKA 09/28/22. PMH: PD, vertigo, RBBB,CVA/TIA, back surgery  ,"L foot drop"    PT Comments  Pt agreeable to therapy. Reviewed/practiced exercises, gait training. No steps/stairs to enter home. Issued HEP for pt to follow until he begins OPPT. All PT education completed.    If plan is discharge home, recommend the following: A little help with walking and/or transfers;A little help with bathing/dressing/bathroom;Assistance with cooking/housework;Assist for transportation;Help with stairs or ramp for entrance   Can travel by private vehicle        Equipment Recommendations  None recommended by PT    Recommendations for Other Services       Precautions / Restrictions Precautions Precautions: Knee;Fall Restrictions Weight Bearing Restrictions: No Other Position/Activity Restrictions: WBAT     Mobility  Bed Mobility Overal bed mobility: Needs Assistance Bed Mobility: Supine to Sit     Supine to sit: Supervision          Transfers Overall transfer level: Needs assistance Equipment used: Rolling walker (2 wheels) Transfers: Sit to/from Stand Sit to Stand: Supervision           General transfer comment: Cues for hand/L LE placement.    Ambulation/Gait Ambulation/Gait assistance: Supervision Gait Distance (Feet): 150 Feet Assistive device: Rolling walker (2 wheels) Gait Pattern/deviations: Step-through pattern, Decreased stride length       General Gait Details: No LOB with RW use. Pt tolerated distance well.   Stairs             Wheelchair Mobility     Tilt Bed    Modified Rankin (Stroke Patients Only)       Balance Overall balance assessment: Needs assistance         Standing balance support: Bilateral upper extremity supported, During functional activity, Reliant on  assistive device for balance Standing balance-Leahy Scale: Fair                              Cognition Arousal: Alert   Overall Cognitive Status: Within Functional Limits for tasks assessed                                          Exercises Total Joint Exercises Ankle Circles/Pumps: AROM, Both, 10 reps Quad Sets: AROM, Left, 10 reps Hip ABduction/ADduction: AROM, Left, 10 reps Straight Leg Raises: AROM, Left, 10 reps Knee Flexion: AROM, Left, 10 reps, Seated Goniometric ROM: ~5-75 degrees    General Comments        Pertinent Vitals/Pain Pain Assessment Pain Assessment: 0-10 Pain Score: 7  Pain Location: L knee/thigh Pain Descriptors / Indicators: Aching, Discomfort Pain Intervention(s): Monitored during session, Ice applied    Home Living                          Prior Function            PT Goals (current goals can now be found in the care plan section) Progress towards PT goals: Progressing toward goals    Frequency    7X/week      PT Plan      Co-evaluation  AM-PAC PT "6 Clicks" Mobility   Outcome Measure  Help needed turning from your back to your side while in a flat bed without using bedrails?: None Help needed moving from lying on your back to sitting on the side of a flat bed without using bedrails?: None Help needed moving to and from a bed to a chair (including a wheelchair)?: None Help needed standing up from a chair using your arms (e.g., wheelchair or bedside chair)?: None Help needed to walk in hospital room?: A Little Help needed climbing 3-5 steps with a railing? : A Little 6 Click Score: 22    End of Session Equipment Utilized During Treatment: Gait belt Activity Tolerance: Patient tolerated treatment well Patient left: in chair;with call bell/phone within reach   PT Visit Diagnosis: Difficulty in walking, not elsewhere classified (R26.2);Muscle weakness (generalized)  (M62.81);Pain Pain - Right/Left: Left Pain - part of body: Knee     Time: 0865-7846 PT Time Calculation (min) (ACUTE ONLY): 21 min  Charges:    $Gait Training: 8-22 mins PT General Charges $$ ACUTE PT VISIT: 1 Visit                         Faye Ramsay, PT Acute Rehabilitation  Office: [phone]

## 2022-09-29 NOTE — TOC Transition Note (Signed)
Transition of Care [facility]) - CM/SW Discharge Note  Patient Details  Name: Joshua [name] MRN: [id number] Date of Birth: [birth year]  [facility] CM/SW Contact:  Ewing Schlein, LCSW Phone Number: 09/29/2022, 10:49 AM  Clinical Narrative: Patient is expected to discharge home after working with PT. CSW met with patient to confirm discharge plan. Patient will go home with OPPT, which he reported will be at [facility]. Patient has a rolling walker at home, so there are no DME needs at this time. TOC signing off.    Final next level of care: OP Rehab Barriers to Discharge: No Barriers Identified  Patient Goals and CMS Choice Choice offered to / list presented to : NA  Discharge Plan and Services Additional resources added to the After Visit Summary for          DME Arranged: N/A DME Agency: NA  Social Determinants of Health (SDOH) Interventions SDOH Screenings   Food Insecurity: No Food Insecurity (09/28/2022)  Housing: Patient Declined (09/28/2022)  Transportation Needs: No Transportation Needs (09/28/2022)  Utilities: Not At Risk (09/28/2022)  Depression (PHQ2-9): Low Risk  (04/15/2021)  Tobacco Use: Low Risk  (09/28/2022)   Readmission Risk Interventions     No data to display

## 2022-09-29 NOTE — Progress Notes (Signed)
     Subjective:  Patient reports pain as mild.  Did great with PT yesterday ambulating 80 feet. Hopeful to discharge home today. No new issues.  Objective:   VITALS:   Vitals:   09/28/22 1806 09/28/22 2110 09/29/22 0152 09/29/22 0643  BP: (!) 144/79 (!) 143/86 (!) 142/84 (!) 133/99  Pulse: 72 83 70 69  Resp: 19 18 18 18   Temp: 97.7 F (36.5 C) 98.1 F (36.7 C) 98.1 F (36.7 C) 98.5 F (36.9 C)  TempSrc:      SpO2: 97% 92% 94% 95%  Weight:        Sensation intact distally Intact pulses distally Dorsiflexion/Plantar flexion intact Incision: dressing C/D/I Compartment soft    Lab Results  Component Value Date   WBC 12.7 (H) 09/29/2022   HGB 12.2 (L) 09/29/2022   HCT 37.4 (L) 09/29/2022   MCV 94.9 09/29/2022   PLT 193 09/29/2022   BMET    Component Value Date/Time   NA 136 09/29/2022 0342   NA 136 02/14/2021 1528   K 3.9 09/29/2022 0342   CL 103 09/29/2022 0342   CO2 25 09/29/2022 0342   GLUCOSE 145 (H) 09/29/2022 0342   BUN 20 09/29/2022 0342   BUN 20 02/14/2021 1528   CREATININE 0.83 09/29/2022 0342   CREATININE 0.88 09/18/2015 1135   CALCIUM 8.7 (L) 09/29/2022 0342   EGFR 73 02/14/2021 1528   GFRNONAA >60 09/29/2022 0342      Xray: TKA components in good position, no adverse features  Assessment/Plan: 1 Day Post-Op   Principal Problem:   Primary osteoarthritis of left knee  L TKA 09/28/22  Post op recs: WB: WBAT Abx: ancef Imaging: PACU xrays DVT prophylaxis: Aspirin 81mg  BID x4 weeks, resume Plavix starting postop day 2. Follow up: 2 weeks after surgery for a wound check with Dr. Blanchie Dessert at [facility].  Address: [address], [place], [place] [address]  Office Phone: [phone]     Joshua [name] 09/29/2022, 6:51 AM   Weber Cooks, MD  Contact information:   [phone] 7am-5pm epic message Dr. Blanchie Dessert, or call office for patient follow up: [phone] After hours and holidays please  check [email or web address] for group call information for [facility]

## 2022-09-29 NOTE — Plan of Care (Signed)
Problem: Education: Goal: Knowledge of the prescribed therapeutic regimen will improve Outcome: Progressing Goal: Individualized Educational Video(s) Outcome: Progressing

## 2022-10-15 ENCOUNTER — Other Ambulatory Visit: Payer: Self-pay | Admitting: Neurology

## 2022-10-15 DIAGNOSIS — G20A1 Parkinson's disease without dyskinesia, without mention of fluctuations: Secondary | ICD-10-CM

## 2022-11-12 ENCOUNTER — Ambulatory Visit: Payer: Medicare HMO | Attending: Neurology | Admitting: Physical Therapy

## 2022-11-12 ENCOUNTER — Ambulatory Visit: Payer: Medicare HMO | Admitting: Speech Pathology

## 2022-11-12 ENCOUNTER — Ambulatory Visit: Payer: Medicare HMO | Admitting: Occupational Therapy

## 2022-11-13 ENCOUNTER — Other Ambulatory Visit: Payer: Self-pay | Admitting: Neurology

## 2022-12-08 ENCOUNTER — Encounter: Payer: Self-pay | Admitting: Cardiology

## 2022-12-08 ENCOUNTER — Ambulatory Visit: Payer: Medicare HMO | Attending: Cardiology | Admitting: Cardiology

## 2022-12-08 VITALS — BP 126/72 | HR 67 | Ht 73.0 in | Wt 230.0 lb

## 2022-12-08 DIAGNOSIS — I10 Essential (primary) hypertension: Secondary | ICD-10-CM | POA: Diagnosis not present

## 2022-12-08 DIAGNOSIS — I493 Ventricular premature depolarization: Secondary | ICD-10-CM

## 2022-12-08 DIAGNOSIS — I2510 Atherosclerotic heart disease of native coronary artery without angina pectoris: Secondary | ICD-10-CM

## 2022-12-08 DIAGNOSIS — I4510 Unspecified right bundle-branch block: Secondary | ICD-10-CM

## 2022-12-08 DIAGNOSIS — G909 Disorder of the autonomic nervous system, unspecified: Secondary | ICD-10-CM

## 2022-12-08 DIAGNOSIS — G20B1 Parkinson's disease with dyskinesia, without mention of fluctuations: Secondary | ICD-10-CM

## 2022-12-08 NOTE — Progress Notes (Signed)
Cardiology Office Note:  .   Date:  12/08/2022  ID:  Joshua [name], DOB July 20, 1945, MRN [id number] PCP: [name], MD   HeartCare Providers Cardiologist:  Donato Schultz, MD Electrophysiologist:  Will Jorja Loa, MD     History of Present Illness: .   Gunther Bussie is a 77 y.o. male Discussed with the use of AI scribe   History of Present Illness   The patient, a 77 year old with a complex cardiac history, presents for a follow-up visit. He has a history of chest pain, non-ST elevation myocardial infarction (NSTEMI), coronary artery disease (CAD), right bundle branch block (RBBB), and a high burden of premature ventricular contractions (PVCs). He underwent an ablation procedure for PVCs, which initially showed improvement but later recurred. The patient was started on flecainide, which was later switched to mexiletine due to the development of CAD and NSTEMI. The patient had some issues with mexiletine GERD at 250mg  but now is better at 150mg .  The patient also has a history of stroke in February 2017, with a transesophageal echocardiogram (TEE) showing a patent foramen ovale (PFO) and aortic valve vegetation, later identified as Lambl's excrescence. A Link monitor was implanted after the stroke, which showed a 26% PVC burden. The Link monitor has since been explanted.  The patient's cardiac function has been variable, with an ejection fraction (EF) of 48% on a recent stress test, and an EF of 35-45% on cardiac catheterization. However, an echocardiogram in January 2023 showed an EF of 65%. The patient is currently managed with Plavix 75mg  daily, Toprol 12.5mg  daily, and Crestor 20mg  daily for CAD. The patient's LDL is well-controlled at 51.  The patient also has a history of Parkinson's disease, managed by another physician. He was recently started on a new medication, ropinirole, for rigidity, which has shown significant improvement.  In addition to his  cardiac and neurological conditions, the patient has undergone a recent knee replacement and is scheduled for another one soon. He reports doing a lot of exercises, which has increased since his knee replacement.   No SOB, NO CP.           Studies Reviewed: .        Results LABS LDL: 51 Hb: 12.2 Cr: 0.8 K: 3.9  DIAGNOSTIC Stress test: No reversible ischemia EF: 48% TEE: PFO and Lambl's excrescence on aortic valve Link monitor: 26% PVC burden Cardiac catheterization: Diffuse moderate level disease, no culprit lesion identified Echocardiogram: EF 65% (02/06/2021) Cardiac catheterization: EF 35-45% by visual estimate (02/06/2021)  Risk Assessment/Calculations:            Physical Exam:   VS:  BP 126/72   Pulse 67   Ht 6\' 1"  (1.854 m)   Wt 230 lb (104.3 kg)   SpO2 95%   BMI 30.34 kg/m    Wt Readings from Last 3 Encounters:  12/08/22 230 lb (104.3 kg)  09/28/22 221 lb (100.2 kg)  09/17/22 221 lb (100.2 kg)    GEN: Well nourished, well developed in no acute distress NECK: No JVD; No carotid bruits CARDIAC: RRR, no murmurs, no rubs, no gallops RESPIRATORY:  Clear to auscultation without rales, wheezing or rhonchi  ABDOMEN: Soft, non-tender, non-distended EXTREMITIES:  No edema; No deformity, tremor noted  ASSESSMENT AND PLAN: .    Assessment and Plan    Chest Pain Follow-up for chest pain. Recent stress test showed no reversible ischemia. EF was 48%. Non-STEMI, coronary artery disease, prior cardiac catheterization showing diffuse  moderate disease with no culprit lesion identified. Currently on Plavix, Toprol, and Crestor. Discussed benefits of these medications in stabilizing plaque and preventing further cardiac events. Blood pressure is well-controlled. - Continue Plavix 75 mg daily - Continue Toprol 12.5 mg daily - Continue Crestor 20 mg daily  Premature Ventricular Contractions (PVCs) 26% PVC burden as shown by Link monitor. Previously underwent ablation  for PVCs, which recurred. Currently managed with mexiletine 150 mg twice daily, well-tolerated at this dose. Discussed importance of taking mexiletine with food to prevent heartburn. - Continue mexiletine 150 mg twice daily  Parkinson's Disease Followed by Dr. Eyvonne Left. Recently started on ropinirole for rigidity, showing significant improvement. Reports 125% improvement in symptoms since starting medication. - Continue follow-up with Dr. Eyvonne Left - Continue ropinirole as prescribed  General Health Maintenance Engaging in regular exercise, beneficial for overall health and post-knee replacement recovery. Reports increased exercise regimen post-knee replacement. - Encourage continued exercise regimen  Follow-up - Schedule follow-up appointment in one year.               Signed, Donato Schultz, MD

## 2022-12-08 NOTE — Patient Instructions (Signed)
Medication Instructions:  The current medical regimen is effective;  continue present plan and medications.  *If you need a refill on your cardiac medications before your next appointment, please call your pharmacy*  Follow-Up: At [facility], you and your health needs are our priority.  As part of our continuing mission to provide you with exceptional heart care, we have created designated Provider Care Teams.  These Care Teams include your primary Cardiologist (physician) and Advanced Practice Providers (APPs -  Physician Assistants and Nurse Practitioners) who all work together to provide you with the care you need, when you need it.  We recommend signing up for the patient portal called "MyChart".  Sign up information is provided on this After Visit Summary.  MyChart is used to connect with patients for Virtual Visits (Telemedicine).  Patients are able to view lab/test results, encounter notes, upcoming appointments, etc.  Non-urgent messages can be sent to your provider as well.   To learn more about what you can do with MyChart, go to [email or web address].    Your next appointment:   1 year(s)  Provider:   Mark Skains, MD      

## 2022-12-20 ENCOUNTER — Other Ambulatory Visit: Payer: Self-pay | Admitting: Neurology

## 2022-12-20 DIAGNOSIS — G20A1 Parkinson's disease without dyskinesia, without mention of fluctuations: Secondary | ICD-10-CM

## 2022-12-23 NOTE — Progress Notes (Signed)
Surgery orders requested via Epic inbox. °

## 2022-12-24 ENCOUNTER — Ambulatory Visit: Payer: Self-pay | Admitting: Emergency Medicine

## 2022-12-24 DIAGNOSIS — G8929 Other chronic pain: Secondary | ICD-10-CM

## 2022-12-24 NOTE — H&P (Signed)
TOTAL KNEE ADMISSION H&P  Patient is being admitted for right total knee arthroplasty.  Subjective:  Chief Complaint:right knee pain.  HPI: Joshua [name], 77 y.o. male, has a history of pain and functional disability in the right knee due to arthritis and has failed non-surgical conservative treatments for greater than 12 weeks to includeNSAID's and/or analgesics, corticosteriod injections, viscosupplementation injections, supervised PT with diminished ADL's post treatment, use of assistive devices, and activity modification.  Onset of symptoms was gradual, starting >10 years ago with gradually worsening course since that time. The patient noted no past surgery on the right knee(s).  Patient currently rates pain in the right knee(s) at 10 out of 10 with activity. Patient has night pain, worsening of pain with activity and weight bearing, pain that interferes with activities of daily living, and pain with passive range of motion.  Patient has evidence of periarticular osteophytes and joint space narrowing by imaging studies.  There is no active infection.  Patient Active Problem List   Diagnosis Date Noted   Primary osteoarthritis of left knee 09/28/2022   SBO (small bowel obstruction) (HCC) 05/29/2022   Chest pain 05/26/2022   Epigastric abdominal pain 05/26/2022   Coronary artery disease involving native coronary artery of native heart without angina pectoris 05/28/2021   History of myocardial infarction 02/05/2021   Parkinson's disease (HCC)    Malignant neoplasm of prostate (HCC) 11/01/2017   Essential tremor 12/06/2015   PVC (premature ventricular contraction) 09/26/2015   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) 04/07/2015   Acute CVA (cerebrovascular accident) (HCC) 03/06/2015   Stroke (cerebrum) (HCC) 03/05/2015   Cerebral infarction (HCC) 03/05/2015   Weakness of right upper extremity    Tremor    Ejection fraction    Vertigo    Sleep apnea     Palpitations    Hyperlipidemia with target LDL less than 70    GERD (gastroesophageal reflux disease)    HTN (hypertension)    Chest pain    RBBB (right bundle branch block)    Ejection fraction    Carotid artery disease (HCC)    Past Medical History:  Diagnosis Date   Allergic rhinitis    Anxiety    from chronic pain from surgery- on Cymbalta   Arthritis    Asthma    Barrett's esophagus 03/29/2014   Carotid artery disease (HCC)    Carotid Doppler normal August, 2007   Diverticulosis    Dyslipidemia    GERD (gastroesophageal reflux disease)    History of loop recorder    has since 04/04/15   HTN (hypertension)    takes Metoprolol for PVC control   Hx of colonic polyps    adenomatous   IFG (impaired fasting glucose)    Myocardial infarction (HCC)    mild   Palpitations    Benign PVCs   Parkinson disease (HCC)    Prostate cancer (HCC)    RBBB (right bundle branch block)    rate related   Shingles    TIA (transient ischemic attack) 02/2015   Per pt, had 2 strokes   Vertigo     Past Surgical History:  Procedure Laterality Date   BACK SURGERY  2002,2009   x 6   CHOLECYSTECTOMY  11/30/2012   with IOC   COLONOSCOPY     ELECTROPHYSIOLOGIC STUDY N/A 09/26/2015   Procedure: V Tach Ablation (PVC);  Surgeon: Will Jorja Loa, MD;  Location: [facility];  Service: Cardiovascular;  Laterality: N/A;  EP IMPLANTABLE DEVICE N/A 04/04/2015   Procedure: Loop Recorder Insertion;  Surgeon: Will Jorja Loa, MD;  Location: [facility];  Service: Cardiovascular;  Laterality: N/A;   HERNIA REPAIR     laprascopic   KNEE SURGERY     DECEMBER 2017,LEFT KNEE SCOPED   LEFT HEART CATH AND CORONARY ANGIOGRAPHY N/A 02/05/2021   Procedure: LEFT HEART CATH AND CORONARY ANGIOGRAPHY;  Surgeon: Iran Ouch, MD;  Location: [facility];  Service: Cardiovascular;  Laterality: N/A;   NECK SURGERY  2002   POLYPECTOMY     ROTATOR CUFF REPAIR Left    TEE WITHOUT  CARDIOVERSION N/A 04/04/2015   Procedure: TRANSESOPHAGEAL ECHOCARDIOGRAM (TEE);  Surgeon: Lewayne Bunting, MD;  Location: [facility];  Service: Cardiovascular;  Laterality: N/A;   TOTAL KNEE ARTHROPLASTY Left 09/28/2022   Procedure: TOTAL KNEE ARTHROPLASTY;  Surgeon: Joen Laura, MD;  Location: [facility];  Service: Orthopedics;  Laterality: Left;   TRIGGER FINGER RELEASE Left 12/28/2019   Procedure: RELEASE TRIGGER FINGER/A-1 PULLEY THUMB, MIDDLE AND RING;  Surgeon: Cindee Salt, MD;  Location: [facility];  Service: Orthopedics;  Laterality: Left;  FAB   UPPER GASTROINTESTINAL ENDOSCOPY     V Tach ablation  09/26/2015    Current Outpatient Medications  Medication Sig Dispense Refill Last Dose/Taking   albuterol (VENTOLIN HFA) 108 (90 Base) MCG/ACT inhaler Inhale 2 puffs into the lungs every 4 (four) hours as needed for wheezing or shortness of breath.      azelastine (OPTIVAR) 0.05 % ophthalmic solution Apply 1 drop to eye 2 (two) times daily.      Biotin 40981 MCG TABS Take 1,000 mcg by mouth in the morning.      carbidopa-levodopa (SINEMET CR) 50-200 MG tablet TAKE 1 TABLET BY MOUTH EVERYDAY AT BEDTIME 90 tablet 0    carbidopa-levodopa (SINEMET IR) 25-100 MG tablet 2 TABLETS AT 7 AM, 2 TABLETS AT 10 AM,2 TABLETS AT 1PM,2 TABLETS AT 4 PM /2 TABLETS AT 7 PM 900 tablet 0    cholecalciferol (VITAMIN D3) 25 MCG (1000 UNIT) tablet Take 1,000 Units by mouth daily.      clopidogrel (PLAVIX) 75 MG tablet Take 1 tablet (75 mg total) by mouth daily. (Patient taking differently: Take 75 mg by mouth every evening.) 30 tablet 2    cyanocobalamin (VITAMIN B12) 1000 MCG tablet Take 1,000 mcg by mouth daily.      DYMISTA 137-50 MCG/ACT SUSP Place 1 puff into both nostrils at bedtime.       escitalopram (LEXAPRO) 20 MG tablet Take 20 mg by mouth daily.      finasteride (PROSCAR) 5 MG tablet Take 5 mg by mouth every evening.      lansoprazole (PREVACID) 30 MG capsule Take 30 mg by mouth 2  (two) times daily. Morning & mid-afternoon      levocetirizine (XYZAL) 5 MG tablet Take 5 mg by mouth every evening.        Menthol, Topical Analgesic, (BIOFREEZE EX) Apply 1 Application topically daily as needed (pain).      metoprolol succinate (TOPROL-XL) 25 MG 24 hr tablet TAKE 1/2 TABLET BY MOUTH DAILY (Patient taking differently: Take 12.5 mg by mouth at bedtime.) 45 tablet 2    mexiletine (MEXITIL) 150 MG capsule TAKE 1 CAPSULE BY MOUTH TWICE A DAY 180 capsule 3    midodrine (PROAMATINE) 5 MG tablet TAKE 1 TABLET BY MOUTH 2 TIMES DAILY AS NEEDED. TAKE IF SYSTOLIC BLOOD PRESSURE GETS BELOW  100 (Patient taking differently: Take 5 mg by mouth See admin instructions. Take 5 mg daily, may take a second 5 mg dose as needed for low bp) 180 tablet 3    montelukast (SINGULAIR) 10 MG tablet Take 10 mg by mouth at bedtime.        nitroGLYCERIN (NITROSTAT) 0.4 MG SL tablet Place 1 tablet (0.4 mg total) under the tongue every 5 (five) minutes as needed for chest pain (Do not give more than 3 SL tablets in 15 minutes.). 25 tablet 2    Omega-3 Fatty Acids (FISH OIL) 1000 MG CAPS Take 1,000 mg by mouth in the morning.      polyethylene glycol (MIRALAX) 17 g packet Take 17 g by mouth daily. 14 each 0    PROCTOSOL HC 2.5 % rectal cream APPLY INTO AND AROUND RECTUM 2 TIMES A DAY (Patient taking differently: Place 1 Application rectally 2 (two) times daily as needed for hemorrhoids or anal itching.) 28.35 g 0    rOPINIRole (REQUIP) 1 MG tablet TAKE 1 TABLET BY MOUTH 3 TIMES DAILY. 270 tablet 0    rosuvastatin (CRESTOR) 20 MG tablet Take 1 tablet (20 mg total) by mouth at bedtime. 90 tablet 0    tamsulosin (FLOMAX) 0.4 MG CAPS capsule Take 0.4 mg by mouth daily after supper.       zolpidem (AMBIEN) 10 MG tablet Take 10 mg by mouth at bedtime.      No current facility-administered medications for this visit.   Allergies  Allergen Reactions   Floxin I.V. In Dextrose 5% [Ofloxacin] Other (See Comments)    Lowers  BP    Terazosin Other (See Comments)    Lower bp    Social History   Tobacco Use   Smoking status: Never   Smokeless tobacco: Never  Substance Use Topics   Alcohol use: No    Alcohol/week: 0.0 standard drinks of alcohol    Family History  Problem Relation Age of Onset   Hypertension Mother    Heart attack Father    Heart disease Father    Clotting disorder Father    Heart disease Brother    Lung cancer Brother    Prostate cancer Brother    Breast cancer Paternal Grandmother    Heart attack Paternal Grandfather    Healthy Daughter    Healthy Daughter    Esophageal cancer Neg Hx    Stomach cancer Neg Hx    Rectal cancer Neg Hx    Colon polyps Neg Hx      Review of Systems  Musculoskeletal:  Positive for arthralgias.  All other systems reviewed and are negative.   Objective:  Physical Exam Constitutional:      General: He is not in acute distress.    Appearance: Normal appearance. He is normal weight.  HENT:     Head: Normocephalic and atraumatic.  Eyes:     Extraocular Movements: Extraocular movements intact.     Conjunctiva/sclera: Conjunctivae normal.     Pupils: Pupils are equal, round, and reactive to light.  Cardiovascular:     Rate and Rhythm: Normal rate and regular rhythm.     Pulses: Normal pulses.     Heart sounds: Normal heart sounds.  Pulmonary:     Effort: Pulmonary effort is normal. No respiratory distress.     Breath sounds: Normal breath sounds.  Abdominal:     General: Bowel sounds are normal. There is no distension.     Palpations: Abdomen is soft.  Tenderness: There is no abdominal tenderness.  Musculoskeletal:        General: Tenderness present.     Cervical back: Normal range of motion and neck supple.     Comments: TTP over medial and lateral joint line, medial worse than lateral.  No calf tenderness, swelling, or erythema.  No overlying lesions of area of chief complaint.  Decreased strength and ROM due to elicited pain.   Pre-operative ROM 10-120.  Dorsiflexion and plantarflexion intact.  Stable to varus and valgus stress.  BLE appear grossly neurovascularly intact.  Shuffling gait with use of cane.  Resting pill-rolling tremor.   Lymphadenopathy:     Cervical: No cervical adenopathy.  Skin:    General: Skin is warm and dry.     Capillary Refill: Capillary refill takes less than 2 seconds.     Findings: No erythema or rash.  Neurological:     General: No focal deficit present.     Mental Status: He is alert and oriented to person, place, and time.  Psychiatric:        Mood and Affect: Mood normal.        Behavior: Behavior normal.     Vital signs in last 24 hours: @VSRANGES @  Labs:   Estimated body mass index is 30.34 kg/m as calculated from the following:   Height as of 12/08/22: 6\' 1"  (1.854 m).   Weight as of 12/08/22: 104.3 kg.   Imaging Review Plain radiographs demonstrate severe degenerative joint disease of the right knee(s). The overall alignment issignificant varus. The bone quality appears to be fair for age and reported activity level.      Assessment/Plan:  End stage arthritis, right knee   The patient history, physical examination, clinical judgment of the provider and imaging studies are consistent with end stage degenerative joint disease of the right knee(s) and total knee arthroplasty is deemed medically necessary. The treatment options including medical management, injection therapy arthroscopy and arthroplasty were discussed at length. The risks and benefits of total knee arthroplasty were presented and reviewed. The risks due to aseptic loosening, infection, stiffness, patella tracking problems, thromboembolic complications and other imponderables were discussed. The patient acknowledged the explanation, agreed to proceed with the plan and consent was signed. Patient is being admitted for inpatient treatment for surgery, pain control, PT, OT, prophylactic antibiotics, VTE  prophylaxis, progressive ambulation and ADL's and discharge planning. The patient is planning to be discharged home with [facility]).    Anticipated LOS equal to or greater than 2 midnights due to - Age 30 and older with one or more of the following:  - Obesity  - Expected need for hospital services (PT, OT, Nursing) required for safe  discharge  - Anticipated need for postoperative skilled nursing care or inpatient rehab  - Active co-morbidities: CAD, prior MI, prostate cancer 2017, HTN, HLD, GERD, RBBB, Barrett's Esophagus, Parkinsons, insomnia, IBS-C, MDD OR   - Unanticipated findings during/Post Surgery: None  - Patient is a high risk of re-admission due to: None

## 2022-12-24 NOTE — H&P (View-Only) (Signed)
 TOTAL KNEE ADMISSION H&P  Patient is being admitted for right total knee arthroplasty.  Subjective:  Chief Complaint:right knee pain.  HPI: Joshua [name], 77 y.o. male, has a history of pain and functional disability in the right knee due to arthritis and has failed non-surgical conservative treatments for greater than 12 weeks to includeNSAID's and/or analgesics, corticosteriod injections, viscosupplementation injections, supervised PT with diminished ADL's post treatment, use of assistive devices, and activity modification.  Onset of symptoms was gradual, starting >10 years ago with gradually worsening course since that time. The patient noted no past surgery on the right knee(s).  Patient currently rates pain in the right knee(s) at 10 out of 10 with activity. Patient has night pain, worsening of pain with activity and weight bearing, pain that interferes with activities of daily living, and pain with passive range of motion.  Patient has evidence of periarticular osteophytes and joint space narrowing by imaging studies.  There is no active infection.  Patient Active Problem List   Diagnosis Date Noted   Primary osteoarthritis of left knee 09/28/2022   SBO (small bowel obstruction) (HCC) 05/29/2022   Chest pain 05/26/2022   Epigastric abdominal pain 05/26/2022   Coronary artery disease involving native coronary artery of native heart without angina pectoris 05/28/2021   History of myocardial infarction 02/05/2021   Parkinson's disease (HCC)    Malignant neoplasm of prostate (HCC) 11/01/2017   Essential tremor 12/06/2015   PVC (premature ventricular contraction) 09/26/2015   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) 04/07/2015   Acute CVA (cerebrovascular accident) (HCC) 03/06/2015   Stroke (cerebrum) (HCC) 03/05/2015   Cerebral infarction (HCC) 03/05/2015   Weakness of right upper extremity    Tremor    Ejection fraction    Vertigo    Sleep apnea     Palpitations    Hyperlipidemia with target LDL less than 70    GERD (gastroesophageal reflux disease)    HTN (hypertension)    Chest pain    RBBB (right bundle branch block)    Ejection fraction    Carotid artery disease (HCC)    Past Medical History:  Diagnosis Date   Allergic rhinitis    Anxiety    from chronic pain from surgery- on Cymbalta   Arthritis    Asthma    Barrett's esophagus 03/29/2014   Carotid artery disease (HCC)    Carotid Doppler normal August, 2007   Diverticulosis    Dyslipidemia    GERD (gastroesophageal reflux disease)    History of loop recorder    has since 04/04/15   HTN (hypertension)    takes Metoprolol for PVC control   Hx of colonic polyps    adenomatous   IFG (impaired fasting glucose)    Myocardial infarction (HCC)    mild   Palpitations    Benign PVCs   Parkinson disease (HCC)    Prostate cancer (HCC)    RBBB (right bundle branch block)    rate related   Shingles    TIA (transient ischemic attack) 02/2015   Per pt, had 2 strokes   Vertigo     Past Surgical History:  Procedure Laterality Date   BACK SURGERY  2002,2009   x 6   CHOLECYSTECTOMY  11/30/2012   with IOC   COLONOSCOPY     ELECTROPHYSIOLOGIC STUDY N/A 09/26/2015   Procedure: V Tach Ablation (PVC);  Surgeon: Will Jorja Loa, MD;  Location: [facility];  Service: Cardiovascular;  Laterality: N/A;  EP IMPLANTABLE DEVICE N/A 04/04/2015   Procedure: Loop Recorder Insertion;  Surgeon: Will Jorja Loa, MD;  Location: [facility];  Service: Cardiovascular;  Laterality: N/A;   HERNIA REPAIR     laprascopic   KNEE SURGERY     DECEMBER 2017,LEFT KNEE SCOPED   LEFT HEART CATH AND CORONARY ANGIOGRAPHY N/A 02/05/2021   Procedure: LEFT HEART CATH AND CORONARY ANGIOGRAPHY;  Surgeon: Iran Ouch, MD;  Location: [facility];  Service: Cardiovascular;  Laterality: N/A;   NECK SURGERY  2002   POLYPECTOMY     ROTATOR CUFF REPAIR Left    TEE WITHOUT  CARDIOVERSION N/A 04/04/2015   Procedure: TRANSESOPHAGEAL ECHOCARDIOGRAM (TEE);  Surgeon: Lewayne Bunting, MD;  Location: [facility];  Service: Cardiovascular;  Laterality: N/A;   TOTAL KNEE ARTHROPLASTY Left 09/28/2022   Procedure: TOTAL KNEE ARTHROPLASTY;  Surgeon: Joen Laura, MD;  Location: [facility];  Service: Orthopedics;  Laterality: Left;   TRIGGER FINGER RELEASE Left 12/28/2019   Procedure: RELEASE TRIGGER FINGER/A-1 PULLEY THUMB, MIDDLE AND RING;  Surgeon: Cindee Salt, MD;  Location: [facility];  Service: Orthopedics;  Laterality: Left;  FAB   UPPER GASTROINTESTINAL ENDOSCOPY     V Tach ablation  09/26/2015    Current Outpatient Medications  Medication Sig Dispense Refill Last Dose/Taking   albuterol (VENTOLIN HFA) 108 (90 Base) MCG/ACT inhaler Inhale 2 puffs into the lungs every 4 (four) hours as needed for wheezing or shortness of breath.      azelastine (OPTIVAR) 0.05 % ophthalmic solution Apply 1 drop to eye 2 (two) times daily.      Biotin 40981 MCG TABS Take 1,000 mcg by mouth in the morning.      carbidopa-levodopa (SINEMET CR) 50-200 MG tablet TAKE 1 TABLET BY MOUTH EVERYDAY AT BEDTIME 90 tablet 0    carbidopa-levodopa (SINEMET IR) 25-100 MG tablet 2 TABLETS AT 7 AM, 2 TABLETS AT 10 AM,2 TABLETS AT 1PM,2 TABLETS AT 4 PM /2 TABLETS AT 7 PM 900 tablet 0    cholecalciferol (VITAMIN D3) 25 MCG (1000 UNIT) tablet Take 1,000 Units by mouth daily.      clopidogrel (PLAVIX) 75 MG tablet Take 1 tablet (75 mg total) by mouth daily. (Patient taking differently: Take 75 mg by mouth every evening.) 30 tablet 2    cyanocobalamin (VITAMIN B12) 1000 MCG tablet Take 1,000 mcg by mouth daily.      DYMISTA 137-50 MCG/ACT SUSP Place 1 puff into both nostrils at bedtime.       escitalopram (LEXAPRO) 20 MG tablet Take 20 mg by mouth daily.      finasteride (PROSCAR) 5 MG tablet Take 5 mg by mouth every evening.      lansoprazole (PREVACID) 30 MG capsule Take 30 mg by mouth 2  (two) times daily. Morning & mid-afternoon      levocetirizine (XYZAL) 5 MG tablet Take 5 mg by mouth every evening.        Menthol, Topical Analgesic, (BIOFREEZE EX) Apply 1 Application topically daily as needed (pain).      metoprolol succinate (TOPROL-XL) 25 MG 24 hr tablet TAKE 1/2 TABLET BY MOUTH DAILY (Patient taking differently: Take 12.5 mg by mouth at bedtime.) 45 tablet 2    mexiletine (MEXITIL) 150 MG capsule TAKE 1 CAPSULE BY MOUTH TWICE A DAY 180 capsule 3    midodrine (PROAMATINE) 5 MG tablet TAKE 1 TABLET BY MOUTH 2 TIMES DAILY AS NEEDED. TAKE IF SYSTOLIC BLOOD PRESSURE GETS BELOW  100 (Patient taking differently: Take 5 mg by mouth See admin instructions. Take 5 mg daily, may take a second 5 mg dose as needed for low bp) 180 tablet 3    montelukast (SINGULAIR) 10 MG tablet Take 10 mg by mouth at bedtime.        nitroGLYCERIN (NITROSTAT) 0.4 MG SL tablet Place 1 tablet (0.4 mg total) under the tongue every 5 (five) minutes as needed for chest pain (Do not give more than 3 SL tablets in 15 minutes.). 25 tablet 2    Omega-3 Fatty Acids (FISH OIL) 1000 MG CAPS Take 1,000 mg by mouth in the morning.      polyethylene glycol (MIRALAX) 17 g packet Take 17 g by mouth daily. 14 each 0    PROCTOSOL HC 2.5 % rectal cream APPLY INTO AND AROUND RECTUM 2 TIMES A DAY (Patient taking differently: Place 1 Application rectally 2 (two) times daily as needed for hemorrhoids or anal itching.) 28.35 g 0    rOPINIRole (REQUIP) 1 MG tablet TAKE 1 TABLET BY MOUTH 3 TIMES DAILY. 270 tablet 0    rosuvastatin (CRESTOR) 20 MG tablet Take 1 tablet (20 mg total) by mouth at bedtime. 90 tablet 0    tamsulosin (FLOMAX) 0.4 MG CAPS capsule Take 0.4 mg by mouth daily after supper.       zolpidem (AMBIEN) 10 MG tablet Take 10 mg by mouth at bedtime.      No current facility-administered medications for this visit.   Allergies  Allergen Reactions   Floxin I.V. In Dextrose 5% [Ofloxacin] Other (See Comments)    Lowers  BP    Terazosin Other (See Comments)    Lower bp    Social History   Tobacco Use   Smoking status: Never   Smokeless tobacco: Never  Substance Use Topics   Alcohol use: No    Alcohol/week: 0.0 standard drinks of alcohol    Family History  Problem Relation Age of Onset   Hypertension Mother    Heart attack Father    Heart disease Father    Clotting disorder Father    Heart disease Brother    Lung cancer Brother    Prostate cancer Brother    Breast cancer Paternal Grandmother    Heart attack Paternal Grandfather    Healthy Daughter    Healthy Daughter    Esophageal cancer Neg Hx    Stomach cancer Neg Hx    Rectal cancer Neg Hx    Colon polyps Neg Hx      Review of Systems  Musculoskeletal:  Positive for arthralgias.  All other systems reviewed and are negative.   Objective:  Physical Exam Constitutional:      General: He is not in acute distress.    Appearance: Normal appearance. He is normal weight.  HENT:     Head: Normocephalic and atraumatic.  Eyes:     Extraocular Movements: Extraocular movements intact.     Conjunctiva/sclera: Conjunctivae normal.     Pupils: Pupils are equal, round, and reactive to light.  Cardiovascular:     Rate and Rhythm: Normal rate and regular rhythm.     Pulses: Normal pulses.     Heart sounds: Normal heart sounds.  Pulmonary:     Effort: Pulmonary effort is normal. No respiratory distress.     Breath sounds: Normal breath sounds.  Abdominal:     General: Bowel sounds are normal. There is no distension.     Palpations: Abdomen is soft.  Tenderness: There is no abdominal tenderness.  Musculoskeletal:        General: Tenderness present.     Cervical back: Normal range of motion and neck supple.     Comments: TTP over medial and lateral joint line, medial worse than lateral.  No calf tenderness, swelling, or erythema.  No overlying lesions of area of chief complaint.  Decreased strength and ROM due to elicited pain.   Pre-operative ROM 10-120.  Dorsiflexion and plantarflexion intact.  Stable to varus and valgus stress.  BLE appear grossly neurovascularly intact.  Shuffling gait with use of cane.  Resting pill-rolling tremor.   Lymphadenopathy:     Cervical: No cervical adenopathy.  Skin:    General: Skin is warm and dry.     Capillary Refill: Capillary refill takes less than 2 seconds.     Findings: No erythema or rash.  Neurological:     General: No focal deficit present.     Mental Status: He is alert and oriented to person, place, and time.  Psychiatric:        Mood and Affect: Mood normal.        Behavior: Behavior normal.     Vital signs in last 24 hours: @VSRANGES @  Labs:   Estimated body mass index is 30.34 kg/m as calculated from the following:   Height as of 12/08/22: 6\' 1"  (1.854 m).   Weight as of 12/08/22: 104.3 kg.   Imaging Review Plain radiographs demonstrate severe degenerative joint disease of the right knee(s). The overall alignment issignificant varus. The bone quality appears to be fair for age and reported activity level.      Assessment/Plan:  End stage arthritis, right knee   The patient history, physical examination, clinical judgment of the provider and imaging studies are consistent with end stage degenerative joint disease of the right knee(s) and total knee arthroplasty is deemed medically necessary. The treatment options including medical management, injection therapy arthroscopy and arthroplasty were discussed at length. The risks and benefits of total knee arthroplasty were presented and reviewed. The risks due to aseptic loosening, infection, stiffness, patella tracking problems, thromboembolic complications and other imponderables were discussed. The patient acknowledged the explanation, agreed to proceed with the plan and consent was signed. Patient is being admitted for inpatient treatment for surgery, pain control, PT, OT, prophylactic antibiotics, VTE  prophylaxis, progressive ambulation and ADL's and discharge planning. The patient is planning to be discharged home with [facility]).    Anticipated LOS equal to or greater than 2 midnights due to - Age 30 and older with one or more of the following:  - Obesity  - Expected need for hospital services (PT, OT, Nursing) required for safe  discharge  - Anticipated need for postoperative skilled nursing care or inpatient rehab  - Active co-morbidities: CAD, prior MI, prostate cancer 2017, HTN, HLD, GERD, RBBB, Barrett's Esophagus, Parkinsons, insomnia, IBS-C, MDD OR   - Unanticipated findings during/Post Surgery: None  - Patient is a high risk of re-admission due to: None

## 2022-12-29 ENCOUNTER — Ambulatory Visit: Payer: Self-pay | Admitting: Emergency Medicine

## 2022-12-29 NOTE — Progress Notes (Signed)
COVID Vaccine received:  []  No [x]  Yes Date of any COVID positive Test in last 90 days:  PCP - Martha Clan, MD Medical clearance in [place] 11-19-22 scanned Cardiologist - Donato Schultz, MD EP- Loman Brooklyn, MD  Neurology- Kerin Salen, DO  Chest x-ray - 05-26-2022  1v   Epic EKG - 05-26-2022  Epic  Stress Test - 05-28-2022  Epic ECHO - 05-27-2022  Epic Cardiac Cath - 02-05-2021  Epic by Dr. Kirke Corin V-Tach ablation- 09-26-2015  by Dr. Elberta Fortis  PCR screen: [x]  Ordered & Completed []   No Order but Needs PROFEND     []   N/A for this surgery  Surgery Plan:  []  Ambulatory   [x]  Outpatient in bed  []  Admit Anesthesia:    []  General  [x]  Spinal  []   Choice []   MAC  Pacemaker / ICD device [x]  No []  Yes   Spinal Cord Stimulator:[x]  No []  Yes    Has Loop recorder (EOL 2020) but this has been removed ????     History of Sleep Apnea? [x]  No []  Yes study negative CPAP used?- [x]  No []  Yes    Does the patient monitor blood sugar?   [x]  N/A   []  No []  Yes  Patient has: [x]  NO Hx DM   []  Pre-DM   []  DM1  []   DM2 Last A1c was: normal 5.3 on 05-26-2022      Blood Thinner / Instructions:  Plavix  Hold x 5 days per Joni Reining, NP note 08-19-2022 Aspirin Instructions: none  ERAS Protocol Ordered: []  No  [x]  Yes PRE-SURGERY [x]  ENSURE  []  G2    Patient is to be NPO after: 0545  Dental hx: []  Dentures:  []  N/A      []  Bridge or Partial:                   []  Loose or Damaged teeth:   Comments: Patient was given the 5 CHG shower / bath instructions for TKA surgery along with 2 bottles of the CHG soap. Patient will start this on:  01-07-23  All questions were asked and answered, Patient voiced understanding of this process.   Activity level: Can perform activities of daily living without stopping and without symptoms of chest pain or shortness of breath. Difficulty with stairs due to knees   Anesthesia review: CAD-NSTEMI, HTN, RBBB, Hx CVA 2017, carotid artery disease, PVC, OSA- No CPAP, s/p back  surgeries x 6, GERD, Parkinson's- essential tremors  Patient denies shortness of breath, fever, cough and chest pain at PAT appointment.  Patient verbalized understanding and agreement to the Pre-Surgical Instructions that were given to them at this PAT appointment. Patient was also educated of the need to review these PAT instructions again prior to his surgery.I reviewed the appropriate phone numbers to call if they have any and questions or concerns.

## 2022-12-29 NOTE — Patient Instructions (Signed)
SURGICAL WAITING ROOM VISITATION Patients having surgery or a procedure may have no more than 2 support people in the waiting area - these visitors may rotate in the visitor waiting room.   Due to an increase in RSV and influenza rates and associated hospitalizations, children ages 90 and under may not visit patients in [facility] hospitals. If the patient needs to stay at the hospital during part of their recovery, the visitor guidelines for inpatient rooms apply.  PRE-OP VISITATION  Pre-op nurse will coordinate an appropriate time for 1 support person to accompany the patient in pre-op.  This support person may not rotate.  This visitor will be contacted when the time is appropriate for the visitor to come back in the pre-op area.  Please refer to the [facility] website for the visitor guidelines for Inpatients (after your surgery is over and you are in a regular room).  You are not required to quarantine at this time prior to your surgery. However, you must do this: Hand Hygiene often Do NOT share personal items Notify your provider if you are in close contact with someone who has COVID or you develop fever 100.4 or greater, new onset of sneezing, cough, sore throat, shortness of breath or body aches.  If you test positive for Covid or have been in contact with anyone that has tested positive in the last 10 days please notify you surgeon.    Your procedure is scheduled on:  Monday  January 11, 2023  Report to [facility] Main Entrance: [place] entrance where the [employer] is available.   Report to admitting at: 06:15    AM  Call this number if you have any questions or problems the morning of surgery [phone]  Do not eat food after Midnight the night prior to your surgery/procedure.  After Midnight you may have the following liquids until   05:45 AM  DAY OF SURGERY  Clear Liquid Diet Water Black Coffee (sugar ok, NO MILK/CREAM OR CREAMERS)  Tea (sugar ok, NO  MILK/CREAM OR CREAMERS) regular and decaf                             Plain Jell-O  with no fruit (NO RED)                                           Fruit ices (not with fruit pulp, NO RED)                                     Popsicles (NO RED)                                                                  Juice: NO CITRUS JUICES: only apple, WHITE grape, WHITE cranberry Sports drinks like Gatorade or Powerade (NO RED)                   The day of surgery:  Drink ONE (1) Pre-Surgery Clear Ensure at  5:45 AM the morning of  surgery. Drink in one sitting. Do not sip.  This drink was given to you during your hospital pre-op appointment visit. Nothing else to drink after completing the Pre-Surgery Clear Ensure : No candy, chewing gum or throat lozenges.    FOLLOW ANY ADDITIONAL PRE OP INSTRUCTIONS YOU RECEIVED FROM YOUR SURGEON'S OFFICE!!!   Oral Hygiene is also important to reduce your risk of infection.        Remember - BRUSH YOUR TEETH THE MORNING OF SURGERY WITH YOUR REGULAR TOOTHPASTE  Do NOT smoke after Midnight the night before surgery.  STOP TAKING all Vitamins, Herbs and supplements 1 week before your surgery.   PLAVIX- stop taking Plavix 5 days before surgery. Last dose will be taken on Thursday 01-05-23  Take ONLY these medicines the morning of surgery with A SIP OF WATER: lansoprazole (Prevacid), Midodrine (Proamatine) Ropinirole (Requip), carbidopa-levodopa (Sinemet), escitalopram (Lexapro), mexiletine (Mexitil) and Tylenol if needed for pain.  You may use your Eye drops and the Albuterol Inhaler if needed.   You may not have any metal on your body including  jewelry, and body piercing  Do not wear  lotions, powders, cologne, or deodorant  Men may shave face and neck.  Contacts, Hearing Aids, dentures or bridgework may not be worn into surgery. DENTURES WILL BE REMOVED PRIOR TO SURGERY PLEASE DO NOT APPLY "Poly grip" OR ADHESIVES!!!  You may bring a small overnight bag  with you on the day of surgery, only pack items that are not valuable. [facility] IS NOT RESPONSIBLE   FOR VALUABLES THAT ARE LOST OR STOLEN.   Do not bring your home medications to the hospital EXCEPT BRING YOUR ALBUTEROL INHALER.  The Pharmacy will dispense medications listed on your medication list to you during your admission in the Hospital.  Special Instructions: Bring a copy of your healthcare power of attorney and living will documents the day of surgery, if you wish to have them scanned into your [facility] Records- EPIC  Please read over the following fact sheets you were given: IF YOU HAVE QUESTIONS ABOUT YOUR PRE-OP INSTRUCTIONS, PLEASE CALL [phone].     Pre-operative 5 CHG Bath Instructions   You can play a key role in reducing the risk of infection after surgery. Your skin needs to be as free of germs as possible. You can reduce the number of germs on your skin by washing with CHG (chlorhexidine gluconate) soap before surgery. CHG is an antiseptic soap that kills germs and continues to kill germs even after washing.   DO NOT use if you have an allergy to chlorhexidine/CHG or antibacterial soaps. If your skin becomes reddened or irritated, stop using the CHG and notify one of our RNs at [phone]  Please shower with the CHG soap starting 4 days before surgery using the following schedule: START SHOWERS ON   January 07, 2023  Please keep in mind the following:  DO NOT shave, including legs and underarms, starting the day of your first shower.   You may shave your face at any point before/day of surgery.   Place clean sheets on your bed the day you start using CHG soap. Use a clean washcloth (not used since being washed) for each shower. DO NOT sleep with pets once you start using the CHG.    CHG Shower Instructions:  If you choose to wash your hair and private area, wash first with your normal shampoo/soap.  After you use shampoo/soap, rinse your hair and body thoroughly to remove shampoo/soap residue.  Turn the water OFF and apply about 3 tablespoons (45 ml) of CHG soap to a CLEAN washcloth.  Apply CHG soap ONLY FROM YOUR NECK DOWN TO YOUR TOES (washing for 3-5 minutes)  DO NOT use CHG soap on face, private areas, open wounds, or sores.  Pay special attention to the area where your surgery is being performed.  If you are having back surgery, having someone wash your back for you may be helpful.  Wait 2 minutes after CHG soap is applied, then you may rinse off the CHG soap.  Pat dry with a clean towel  Put on clean clothes/pajamas   If you choose to wear lotion, please use ONLY the CHG-compatible lotions on the back of this paper.     Additional instructions for the day of surgery: DO NOT APPLY any lotions, deodorants, cologne, or perfumes.   Put on clean/comfortable clothes.  Brush your teeth.  Ask your nurse before applying any prescription medications to the skin.      CHG Compatible Lotions   Aveeno Moisturizing lotion  Cetaphil Moisturizing Cream  Cetaphil Moisturizing Lotion  Clairol Herbal Essence Moisturizing Lotion, Dry Skin  Clairol Herbal Essence Moisturizing Lotion, Extra Dry Skin  Clairol Herbal Essence Moisturizing Lotion, Normal Skin  Curel Age Defying Therapeutic Moisturizing Lotion with Alpha Hydroxy  Curel Extreme Care Body Lotion  Curel Soothing Hands Moisturizing Hand Lotion  Curel Therapeutic Moisturizing Cream, Fragrance-Free  Curel Therapeutic Moisturizing Lotion, Fragrance-Free  Curel Therapeutic Moisturizing Lotion, Original Formula  Eucerin Daily Replenishing Lotion  Eucerin Dry Skin Therapy Plus Alpha Hydroxy Crme  Eucerin Dry Skin Therapy Plus Alpha Hydroxy Lotion  Eucerin Original Crme  Eucerin Original Lotion  Eucerin Plus  Crme Eucerin Plus Lotion  Eucerin TriLipid Replenishing Lotion  Keri Anti-Bacterial Hand Lotion  Keri Deep Conditioning Original Lotion Dry Skin Formula Softly Scented  Keri Deep Conditioning Original Lotion, Fragrance Free Sensitive Skin Formula  Keri Lotion Fast Absorbing Fragrance Free Sensitive Skin Formula  Keri Lotion Fast Absorbing Softly Scented Dry Skin Formula  Keri Original Lotion  Keri Skin Renewal Lotion Keri Silky Smooth Lotion  Keri Silky Smooth Sensitive Skin Lotion  Nivea Body Creamy Conditioning Oil  Nivea Body Extra Enriched Lotion  Nivea Body Original Lotion  Nivea Body Sheer Moisturizing Lotion Nivea Crme  Nivea Skin Firming Lotion  NutraDerm 30 Skin Lotion  NutraDerm Skin Lotion  NutraDerm Therapeutic Skin Cream  NutraDerm Therapeutic Skin Lotion  ProShield Protective Hand Cream  Provon moisturizing lotion   FAILURE TO FOLLOW THESE INSTRUCTIONS MAY RESULT IN THE CANCELLATION OF YOUR SURGERY  PATIENT SIGNATURE_________________________________  NURSE SIGNATURE__________________________________  ________________________________________________________________________      Joshua [name]    An incentive spirometer is a tool that can help keep your lungs clear and active. This tool measures how well you are filling your lungs with each breath. Taking long  deep breaths may help reverse or decrease the chance of developing breathing (pulmonary) problems (especially infection) following: A long period of time when you are unable to move or be active. BEFORE THE PROCEDURE  If the spirometer includes an indicator to show your best effort, your nurse or respiratory therapist will set it to a desired goal. If possible, sit up straight or lean slightly forward. Try not to slouch. Hold the incentive spirometer in an upright position. INSTRUCTIONS FOR USE  Sit on the edge of your bed if possible, or sit up as far as you can in bed or on a chair. Hold the  incentive spirometer in an upright position. Breathe out normally. Place the mouthpiece in your mouth and seal your lips tightly around it. Breathe in slowly and as deeply as possible, raising the piston or the ball toward the top of the column. Hold your breath for 3-5 seconds or for as long as possible. Allow the piston or ball to fall to the bottom of the column. Remove the mouthpiece from your mouth and breathe out normally. Rest for a few seconds and repeat Steps 1 through 7 at least 10 times every 1-2 hours when you are awake. Take your time and take a few normal breaths between deep breaths. The spirometer may include an indicator to show your best effort. Use the indicator as a goal to work toward during each repetition. After each set of 10 deep breaths, practice coughing to be sure your lungs are clear. If you have an incision (the cut made at the time of surgery), support your incision when coughing by placing a pillow or rolled up towels firmly against it. Once you are able to get out of bed, walk around indoors and cough well. You may stop using the incentive spirometer when instructed by your caregiver.  RISKS AND COMPLICATIONS Take your time so you do not get dizzy or light-headed. If you are in pain, you may need to take or ask for pain medication before doing incentive spirometry. It is harder to take a deep breath if you are having pain. AFTER USE Rest and breathe slowly and easily. It can be helpful to keep track of a log of your progress. Your caregiver can provide you with a simple table to help with this. If you are using the spirometer at home, follow these instructions: SEEK MEDICAL CARE IF:  You are having difficultly using the spirometer. You have trouble using the spirometer as often as instructed. Your pain medication is not giving enough relief while using the spirometer. You develop fever of 100.5 F (38.1 C) or higher.                                                                                                     SEEK IMMEDIATE MEDICAL CARE IF:  You cough up bloody sputum that had not been present before. You develop fever of 102 F (38.9 C) or greater. You develop worsening pain at or near the incision site. MAKE SURE YOU:  Understand these instructions. Will watch your condition. Will  get help right away if you are not doing well or get worse. Document Released: 05/11/2006 Document Revised: 03/23/2011 Document Reviewed: 07/12/2006 [facility] Patient Information 2014 [place], [place].      WHAT IS A BLOOD TRANSFUSION? Blood Transfusion Information  A transfusion is the replacement of blood or some of its parts. Blood is made up of multiple cells which provide different functions. Red blood cells carry oxygen and are used for blood loss replacement. White blood cells fight against infection. Platelets control bleeding. Plasma helps clot blood. Other blood products are available for specialized needs, such as hemophilia or other clotting disorders. BEFORE THE TRANSFUSION  Who gives blood for transfusions?  Healthy volunteers who are fully evaluated to make sure their blood is safe. This is blood bank blood. Transfusion therapy is the safest it has ever been in the practice of medicine. Before blood is taken from a donor, a complete history is taken to make sure that person has no history of diseases nor engages in risky social behavior (examples are intravenous drug use or sexual activity with multiple partners). The donor's travel history is screened to minimize risk of transmitting infections, such as malaria. The donated blood is tested for signs of infectious diseases, such as HIV and hepatitis. The blood is then tested to be sure it is compatible with you in order to minimize the chance of a transfusion reaction. If you or a relative donates blood, this is often done in anticipation of surgery and is not appropriate for emergency situations. It  takes many days to process the donated blood. RISKS AND COMPLICATIONS Although transfusion therapy is very safe and saves many lives, the main dangers of transfusion include:  Getting an infectious disease. Developing a transfusion reaction. This is an allergic reaction to something in the blood you were given. Every precaution is taken to prevent this. The decision to have a blood transfusion has been considered carefully by your caregiver before blood is given. Blood is not given unless the benefits outweigh the risks. AFTER THE TRANSFUSION Right after receiving a blood transfusion, you will usually feel much better and more energetic. This is especially true if your red blood cells have gotten low (anemic). The transfusion raises the level of the red blood cells which carry oxygen, and this usually causes an energy increase. The nurse administering the transfusion will monitor you carefully for complications. HOME CARE INSTRUCTIONS  No special instructions are needed after a transfusion. You may find your energy is better. Speak with your caregiver about any limitations on activity for underlying diseases you may have. SEEK MEDICAL CARE IF:  Your condition is not improving after your transfusion. You develop redness or irritation at the intravenous (IV) site. SEEK IMMEDIATE MEDICAL CARE IF:  Any of the following symptoms occur over the next 12 hours: Shaking chills. You have a temperature by mouth above 102 F (38.9 C), not controlled by medicine. Chest, back, or muscle pain. People around you feel you are not acting correctly or are confused. Shortness of breath or difficulty breathing. Dizziness and fainting. You get a rash or develop hives. You have a decrease in urine output. Your urine turns a dark color or changes to pink, red, or brown. Any of the following symptoms occur over the next 10 days: You have a temperature by mouth above 102 F (38.9 C), not controlled by  medicine. Shortness of breath. Weakness after normal activity. The white part of the eye turns yellow (jaundice). You have a decrease  in the amount of urine or are urinating less often. Your urine turns a dark color or changes to pink, red, or brown. Document Released: 12/27/1999 Document Revised: 03/23/2011 Document Reviewed: 08/15/2007 [facility] Patient Information 2014 [place], [place].  _______________________________________________________________________

## 2022-12-30 ENCOUNTER — Encounter (HOSPITAL_COMMUNITY): Payer: Self-pay

## 2022-12-30 ENCOUNTER — Encounter (HOSPITAL_COMMUNITY)
Admission: RE | Admit: 2022-12-30 | Discharge: 2022-12-30 | Disposition: A | Discharge status: Home / Self Care | Payer: Medicare HMO | Source: Ambulatory Visit | Attending: Orthopedic Surgery | Admitting: Orthopedic Surgery

## 2022-12-30 ENCOUNTER — Other Ambulatory Visit: Payer: Self-pay

## 2022-12-30 VITALS — BP 120/69 | HR 68 | Temp 98.2°F | Resp 14 | Ht 73.0 in | Wt 229.0 lb

## 2022-12-30 DIAGNOSIS — K219 Gastro-esophageal reflux disease without esophagitis: Secondary | ICD-10-CM | POA: Diagnosis not present

## 2022-12-30 DIAGNOSIS — I2510 Atherosclerotic heart disease of native coronary artery without angina pectoris: Secondary | ICD-10-CM | POA: Insufficient documentation

## 2022-12-30 DIAGNOSIS — M25561 Pain in right knee: Secondary | ICD-10-CM | POA: Insufficient documentation

## 2022-12-30 DIAGNOSIS — G20A1 Parkinson's disease without dyskinesia, without mention of fluctuations: Secondary | ICD-10-CM | POA: Insufficient documentation

## 2022-12-30 DIAGNOSIS — G8929 Other chronic pain: Secondary | ICD-10-CM | POA: Diagnosis not present

## 2022-12-30 DIAGNOSIS — F419 Anxiety disorder, unspecified: Secondary | ICD-10-CM | POA: Insufficient documentation

## 2022-12-30 DIAGNOSIS — Z8673 Personal history of transient ischemic attack (TIA), and cerebral infarction without residual deficits: Secondary | ICD-10-CM | POA: Diagnosis not present

## 2022-12-30 DIAGNOSIS — Z01812 Encounter for preprocedural laboratory examination: Secondary | ICD-10-CM | POA: Diagnosis present

## 2022-12-30 DIAGNOSIS — Z01818 Encounter for other preprocedural examination: Secondary | ICD-10-CM

## 2022-12-30 DIAGNOSIS — I10 Essential (primary) hypertension: Secondary | ICD-10-CM | POA: Diagnosis not present

## 2022-12-30 DIAGNOSIS — I252 Old myocardial infarction: Secondary | ICD-10-CM | POA: Diagnosis not present

## 2022-12-30 DIAGNOSIS — J45909 Unspecified asthma, uncomplicated: Secondary | ICD-10-CM | POA: Diagnosis not present

## 2022-12-30 HISTORY — DX: Myoneural disorder, unspecified: G70.9

## 2022-12-30 HISTORY — DX: Pneumonia, unspecified organism: J18.9

## 2022-12-30 HISTORY — DX: Atherosclerotic heart disease of native coronary artery without angina pectoris: I25.10

## 2022-12-30 LAB — COMPREHENSIVE METABOLIC PANEL
ALT: <5 U/L (ref 0–44)
AST: 24 U/L (ref 15–41)
Albumin: 4 g/dL (ref 3.5–5.0)
Alkaline Phosphatase: 66 U/L (ref 38–126)
Anion gap: 7 (ref 5–15)
BUN: 16 mg/dL (ref 8–23)
CO2: 25 mmol/L (ref 22–32)
Calcium: 9.2 mg/dL (ref 8.9–10.3)
Chloride: 104 mmol/L (ref 98–111)
Creatinine, Ser: 0.84 mg/dL (ref 0.61–1.24)
GFR, Estimated: >60 mL/min (ref >60)
Glucose, Bld: 112 mg/dL — ABNORMAL HIGH (ref 70–99)
Potassium: 5.3 mmol/L — ABNORMAL HIGH (ref 3.5–5.1)
Sodium: 136 mmol/L (ref 135–145)
Total Bilirubin: 0.9 mg/dL (ref <1.2)
Total Protein: 7 g/dL (ref 6.5–8.1)

## 2022-12-30 LAB — TYPE AND SCREEN
ABO/RH(D): O POS
Antibody Screen: NEGATIVE

## 2022-12-30 LAB — SURGICAL PCR SCREEN
MRSA, PCR: NEGATIVE
Staphylococcus aureus: NEGATIVE

## 2022-12-30 LAB — CBC WITH DIFFERENTIAL/PLATELET
Abs Immature Granulocytes: 0.04 10*3/uL (ref 0.00–0.07)
Basophils Absolute: 0.1 10*3/uL (ref 0.0–0.1)
Basophils Relative: 1 %
Eosinophils Absolute: 0.2 10*3/uL (ref 0.0–0.5)
Eosinophils Relative: 3 %
HCT: 44.7 % (ref 39.0–52.0)
Hemoglobin: 14.1 g/dL (ref 13.0–17.0)
Immature Granulocytes: 1 %
Lymphocytes Relative: 26 %
Lymphs Abs: 1.6 10*3/uL (ref 0.7–4.0)
MCH: 30.7 pg (ref 26.0–34.0)
MCHC: 31.5 g/dL (ref 30.0–36.0)
MCV: 97.2 fL (ref 80.0–100.0)
Monocytes Absolute: 0.6 10*3/uL (ref 0.1–1.0)
Monocytes Relative: 10 %
Neutro Abs: 3.5 10*3/uL (ref 1.7–7.7)
Neutrophils Relative %: 59 %
Platelets: 242 10*3/uL (ref 150–400)
RBC: 4.6 MIL/uL (ref 4.22–5.81)
RDW: 13.4 % (ref 11.5–15.5)
WBC: 6 10*3/uL (ref 4.0–10.5)
nRBC: 0 % (ref 0.0–0.2)

## 2022-12-31 ENCOUNTER — Encounter (HOSPITAL_COMMUNITY): Payer: Self-pay

## 2022-12-31 NOTE — Anesthesia Preprocedure Evaluation (Addendum)
Anesthesia Evaluation  Patient identified by MRN, date of birth, ID band Patient awake    Reviewed: Allergy & Precautions, NPO status , Patient's Chart, lab work & pertinent test results  Airway Mallampati: II  TM Distance: >3 FB Neck ROM: Full    Dental no notable dental hx.    Pulmonary asthma , sleep apnea    Pulmonary exam normal        Cardiovascular hypertension, Pt. on medications and Pt. on home beta blockers + CAD and + Past MI  + dysrhythmias  Rhythm:Regular Rate:Normal  IMPRESSIONS    1. Left ventricular ejection fraction, by estimation, is 60 to 65%. The left ventricle has normal function. The left ventricle has no regional wall motion abnormalities. There is mild concentric left ventricular hypertrophy. Left ventricular diastolic parameters are consistent with Grade I diastolic dysfunction (impaired relaxation).  2. Right ventricular systolic function is normal. The right ventricular size is not well visualized.  3. The mitral valve was not well visualized. No evidence of mitral valve regurgitation.  4. The aortic valve was not well visualized. Aortic valve regurgitation is not visualized. No aortic stenosis is present.   Comparison(s): No significant change from prior study. This study is technically more challenging from prior.    Neuro/Psych   Anxiety    Dementia CVA    GI/Hepatic Neg liver ROS,GERD  Medicated,,  Endo/Other  negative endocrine ROS    Renal/GU negative Renal ROS  negative genitourinary   Musculoskeletal  (+) Arthritis , Osteoarthritis,    Abdominal Normal abdominal exam  (+)   Peds  Hematology Lab Results      Component                Value               Date                      WBC                      5.9                 09/17/2022                HGB                      14.0                09/17/2022                HCT                      43.1                09/17/2022                 MCV                      96.2                09/17/2022                PLT                      243                 09/17/2022  Anesthesia Other Findings   Reproductive/Obstetrics                              Anesthesia Physical Anesthesia Plan  ASA: 3  Anesthesia Plan: MAC and Spinal   Post-op Pain Management: Regional block*   Induction:   PONV Risk Score and Plan: 1 and Ondansetron, Dexamethasone, Treatment may vary due to age or medical condition, Propofol infusion and TIVA  Airway Management Planned: Simple Face Mask and Natural Airway  Additional Equipment: None  Intra-op Plan:   Post-operative Plan:   Informed Consent: I have reviewed the patients History and Physical, chart, labs and discussed the procedure including the risks, benefits and alternatives for the proposed anesthesia with the patient or authorized representative who has indicated his/her understanding and acceptance.       Plan Discussed with: CRNA  Anesthesia Plan Comments: (See PAT note from 12/18 by Sherlie Ban PA-C )        Anesthesia Quick Evaluation

## 2022-12-31 NOTE — Progress Notes (Signed)
DISCUSSION: Joshua [name] is a 77 yo who presents to PAT prior to R TKA on 01/11/23 with Dr. Blanchie Dessert. He had a L TKA on 09/28/22 which was uncomplicated. PMH of HTN, hx of MI, CAD, palpitations, asthma, Parkinson's disease, hx of CVA/TIA, GERD, anxiety, arthritis.  Patient follows with Cardiology for hx of NSTEMI and CAD which is medically managed. He also has a hx of palpatations and PVCs and is s/p ablation. Per Dr. Anne Fu on 12/08/22: "Recent stress test showed no reversible ischemia. EF was 48%. Non-STEMI, coronary artery disease, prior cardiac catheterization showing diffuse moderate disease with no culprit lesion identified. Currently on Plavix, Toprol, and Crestor. Discussed benefits of these medications in stabilizing plaque and preventing further cardiac events. Blood pressure is well-controlled."  Last dose of Plavix: 01-05-2023   Patient follows with Neurology for Parkinson's disease and multiple prior CVAs. Last seen on 08/17/22. Was stable on regimen. Clearance for surgery in September was signed stating:   "He is optimized from a Parkinson's standpoint.  His forms were filled out today."  VS: BP 120/69 Comment: right arm sitting  Pulse 68   Temp 36.8 C (Oral)   Resp 14   Ht 6\' 1"  (1.854 m)   Wt 103.9 kg   SpO2 97%   BMI 30.21 kg/m   PROVIDERS: [name], MD Cardiology: Donato Schultz, MD Neurology: Kerin Salen, DO  LABS: Labs reviewed: Acceptable for surgery. K mildly elevated, possibly hemolyzed? (all labs ordered are listed, but only abnormal results are displayed)  Labs Reviewed  COMPREHENSIVE METABOLIC PANEL - Abnormal; Notable for the following components:      Result Value   Potassium 5.3 (*)    Glucose, Bld 112 (*)    All other components within normal limits  SURGICAL PCR SCREEN  CBC WITH DIFFERENTIAL/PLATELET  TYPE AND SCREEN     IMAGES:   EKG:   CV:  Stress test 05/28/22:  IMPRESSION: 1. No reversible ischemia. Probable diaphragmatic  attenuation artifact.   2. Normal left ventricular wall motion.   3. Left ventricular ejection fraction 48%   4. Non invasive risk stratification*: Low  Echo 05/27/22:  IMPRESSIONS    1. Left ventricular ejection fraction, by estimation, is 60 to 65%. The left ventricle has normal function. The left ventricle has no regional wall motion abnormalities. There is mild concentric left ventricular hypertrophy. Left ventricular diastolic parameters are consistent with Grade I diastolic dysfunction (impaired relaxation).  2. Right ventricular systolic function is normal. The right ventricular size is not well visualized.  3. The mitral valve was not well visualized. No evidence of mitral valve regurgitation.  4. The aortic valve was not well visualized. Aortic valve regurgitation is not visualized. No aortic stenosis is present.  Comparison(s): No significant change from prior study. This study is technically more challenging from prior.  LHC 02/05/21:    Prox LAD to Mid LAD lesion is 30% stenosed.   1st Diag lesion is 70% stenosed.   Mid LAD-1 lesion is 40% stenosed.   Mid LAD-2 lesion is 60% stenosed.   Dist LAD lesion is 80% stenosed.   Ramus-1 lesion is 60% stenosed.   Ramus-2 lesion is 80% stenosed.   There is moderate left ventricular systolic dysfunction.   LV end diastolic pressure is mildly elevated.   The left ventricular ejection fraction is 35-45% by visual estimate.   1.  No clear culprit is identified for non-ST elevation myocardial infarction with no significant disease affecting the main vessels.  He does have distal LAD stenosis as well as distal ramus stenosis supplying small segments and diagonal disease.  Initially, the LAD was thought to be occluded but in reality, it had separate ostium from the left circumflex and I was able to selectively engage the LAD.   2.  Moderately reduced LV systolic function 5 to 40%.  Possible distal anterior and apical  hypokinesis.   Recommendations: Medical therapy with nitroglycerin drip for pain control.  Resume heparin drip 6 hours. Obtain an echocardiogram to evaluate for possible stress-induced cardiomyopathy.  Past Medical History:  Diagnosis Date   Allergic rhinitis    Anxiety    from chronic pain from surgery- on Cymbalta   Arthritis    Asthma    Barrett's esophagus 03/29/2014   Carotid artery disease (HCC)    Carotid Doppler normal August, 2007   Diverticulosis    Dyslipidemia    GERD (gastroesophageal reflux disease)    History of loop recorder    has since 04/04/15   HTN (hypertension)    takes Metoprolol for PVC control   Hx of colonic polyps    adenomatous   IFG (impaired fasting glucose)    Myocardial infarction (HCC)    mild   Neuromuscular disorder (HCC)    parkinson's tremors in hands   Palpitations    Benign PVCs   Parkinson disease (HCC)    Pneumonia    Prostate cancer (HCC)    RBBB (right bundle branch block)    rate related   Shingles    Stroke (HCC) 2017   TIA (transient ischemic attack) 02/2015   Per pt, had 2 strokes   Vertigo     Past Surgical History:  Procedure Laterality Date   BACK SURGERY  2002,2009   x 6   CHOLECYSTECTOMY  11/30/2012   with IOC   COLONOSCOPY     ELECTROPHYSIOLOGIC STUDY N/A 09/26/2015   Procedure: V Tach Ablation (PVC);  Surgeon: Will Jorja Loa, MD;  Location: [facility];  Service: Cardiovascular;  Laterality: N/A;   EP IMPLANTABLE DEVICE N/A 04/04/2015   Procedure: Loop Recorder Insertion;  Surgeon: Will Jorja Loa, MD;  Location: [facility];  Service: Cardiovascular;  Laterality: N/A;   HERNIA REPAIR     laprascopic   KNEE SURGERY     DECEMBER 2017,LEFT KNEE SCOPED   LEFT HEART CATH AND CORONARY ANGIOGRAPHY N/A 02/05/2021   Procedure: LEFT HEART CATH AND CORONARY ANGIOGRAPHY;  Surgeon: Iran Ouch, MD;  Location: [facility];  Service: Cardiovascular;  Laterality: N/A;   NECK SURGERY   2002   POLYPECTOMY     ROTATOR CUFF REPAIR Left    TEE WITHOUT CARDIOVERSION N/A 04/04/2015   Procedure: TRANSESOPHAGEAL ECHOCARDIOGRAM (TEE);  Surgeon: Lewayne Bunting, MD;  Location: [facility] ENDOSCOPY;  Service: Cardiovascular;  Laterality: N/A;   TOTAL KNEE ARTHROPLASTY Left 09/28/2022   Procedure: TOTAL KNEE ARTHROPLASTY;  Surgeon: Joen Laura, MD;  Location: [facility];  Service: Orthopedics;  Laterality: Left;   TRIGGER FINGER RELEASE Left 12/28/2019   Procedure: RELEASE TRIGGER FINGER/A-1 PULLEY THUMB, MIDDLE AND RING;  Surgeon: Cindee Salt, MD;  Location: [facility];  Service: Orthopedics;  Laterality: Left;  FAB   UPPER GASTROINTESTINAL ENDOSCOPY     V Tach ablation  09/26/2015    MEDICATIONS:  acetaminophen (TYLENOL) 650 MG CR tablet   albuterol (VENTOLIN HFA) 108 (90 Base) MCG/ACT inhaler   azelastine (OPTIVAR) 0.05 % ophthalmic solution   Biotin 69629  MCG TABS   carbidopa-levodopa (SINEMET CR) 50-200 MG tablet   carbidopa-levodopa (SINEMET IR) 25-100 MG tablet   cholecalciferol (VITAMIN D3) 25 MCG (1000 UNIT) tablet   clopidogrel (PLAVIX) 75 MG tablet   cyanocobalamin (VITAMIN B12) 1000 MCG tablet   DYMISTA 137-50 MCG/ACT SUSP   escitalopram (LEXAPRO) 20 MG tablet   finasteride (PROSCAR) 5 MG tablet   lansoprazole (PREVACID) 30 MG capsule   levocetirizine (XYZAL) 5 MG tablet   Menthol, Topical Analgesic, (BIOFREEZE EX)   metoprolol succinate (TOPROL-XL) 25 MG 24 hr tablet   mexiletine (MEXITIL) 150 MG capsule   midodrine (PROAMATINE) 5 MG tablet   montelukast (SINGULAIR) 10 MG tablet   nitroGLYCERIN (NITROSTAT) 0.4 MG SL tablet   Omega-3 Fatty Acids (FISH OIL) 1000 MG CAPS   polyethylene glycol (MIRALAX) 17 g packet   PROCTOSOL HC 2.5 % rectal cream   rOPINIRole (REQUIP) 1 MG tablet   rosuvastatin (CRESTOR) 20 MG tablet   tamsulosin (FLOMAX) 0.4 MG CAPS capsule   zolpidem (AMBIEN) 10 MG tablet   No current facility-administered medications for this  encounter.   Marcille Blanco [facility] Surgical Short Stay/Anesthesiology [facility] Phone [phone] 12/31/2022 8:37 AM

## 2023-01-04 ENCOUNTER — Other Ambulatory Visit: Payer: Self-pay | Admitting: Neurology

## 2023-01-04 DIAGNOSIS — G20A1 Parkinson's disease without dyskinesia, without mention of fluctuations: Secondary | ICD-10-CM

## 2023-01-09 ENCOUNTER — Other Ambulatory Visit: Payer: Self-pay | Admitting: Neurology

## 2023-01-09 DIAGNOSIS — G20A1 Parkinson's disease without dyskinesia, without mention of fluctuations: Secondary | ICD-10-CM

## 2023-01-11 ENCOUNTER — Observation Stay (HOSPITAL_COMMUNITY): Payer: Medicare HMO

## 2023-01-11 ENCOUNTER — Encounter (HOSPITAL_COMMUNITY)
Admission: AD | Disposition: A | Discharge status: Rehabilitation Facility | Payer: Self-pay | Source: Home / Self Care | Attending: Orthopedic Surgery

## 2023-01-11 ENCOUNTER — Ambulatory Visit (HOSPITAL_COMMUNITY): Payer: Medicare HMO | Admitting: Medical

## 2023-01-11 ENCOUNTER — Encounter (HOSPITAL_COMMUNITY): Payer: Self-pay | Admitting: Orthopedic Surgery

## 2023-01-11 ENCOUNTER — Inpatient Hospital Stay (HOSPITAL_COMMUNITY): Payer: Medicare HMO

## 2023-01-11 ENCOUNTER — Ambulatory Visit (HOSPITAL_COMMUNITY): Payer: Medicare HMO | Admitting: Registered Nurse

## 2023-01-11 ENCOUNTER — Inpatient Hospital Stay (HOSPITAL_COMMUNITY)
Admission: AD | Admit: 2023-01-11 | Discharge: 2023-01-15 | DRG: 469 | Disposition: A | Discharge status: Rehabilitation Facility | Payer: Medicare HMO | Attending: Orthopedic Surgery | Admitting: Orthopedic Surgery

## 2023-01-11 ENCOUNTER — Telehealth: Payer: Self-pay | Admitting: Neurology

## 2023-01-11 ENCOUNTER — Other Ambulatory Visit: Payer: Self-pay

## 2023-01-11 DIAGNOSIS — I97821 Postprocedural cerebrovascular infarction during other surgery: Secondary | ICD-10-CM | POA: Diagnosis not present

## 2023-01-11 DIAGNOSIS — I10 Essential (primary) hypertension: Secondary | ICD-10-CM | POA: Diagnosis present

## 2023-01-11 DIAGNOSIS — G8191 Hemiplegia, unspecified affecting right dominant side: Secondary | ICD-10-CM | POA: Diagnosis not present

## 2023-01-11 DIAGNOSIS — Z96652 Presence of left artificial knee joint: Secondary | ICD-10-CM | POA: Diagnosis present

## 2023-01-11 DIAGNOSIS — Z832 Family history of diseases of the blood and blood-forming organs and certain disorders involving the immune mechanism: Secondary | ICD-10-CM | POA: Diagnosis not present

## 2023-01-11 DIAGNOSIS — F419 Anxiety disorder, unspecified: Secondary | ICD-10-CM | POA: Diagnosis present

## 2023-01-11 DIAGNOSIS — G8929 Other chronic pain: Secondary | ICD-10-CM | POA: Diagnosis present

## 2023-01-11 DIAGNOSIS — I69351 Hemiplegia and hemiparesis following cerebral infarction affecting right dominant side: Secondary | ICD-10-CM | POA: Diagnosis not present

## 2023-01-11 DIAGNOSIS — I252 Old myocardial infarction: Secondary | ICD-10-CM | POA: Diagnosis not present

## 2023-01-11 DIAGNOSIS — I639 Cerebral infarction, unspecified: Secondary | ICD-10-CM | POA: Diagnosis not present

## 2023-01-11 DIAGNOSIS — E785 Hyperlipidemia, unspecified: Secondary | ICD-10-CM | POA: Diagnosis present

## 2023-01-11 DIAGNOSIS — I9589 Other hypotension: Secondary | ICD-10-CM | POA: Diagnosis present

## 2023-01-11 DIAGNOSIS — Z888 Allergy status to other drugs, medicaments and biological substances status: Secondary | ICD-10-CM

## 2023-01-11 DIAGNOSIS — I63412 Cerebral infarction due to embolism of left middle cerebral artery: Secondary | ICD-10-CM | POA: Diagnosis not present

## 2023-01-11 DIAGNOSIS — Z79899 Other long term (current) drug therapy: Secondary | ICD-10-CM | POA: Diagnosis not present

## 2023-01-11 DIAGNOSIS — N400 Enlarged prostate without lower urinary tract symptoms: Secondary | ICD-10-CM | POA: Diagnosis present

## 2023-01-11 DIAGNOSIS — R233 Spontaneous ecchymoses: Secondary | ICD-10-CM | POA: Diagnosis not present

## 2023-01-11 DIAGNOSIS — I2510 Atherosclerotic heart disease of native coronary artery without angina pectoris: Secondary | ICD-10-CM | POA: Diagnosis present

## 2023-01-11 DIAGNOSIS — I63442 Cerebral infarction due to embolism of left cerebellar artery: Secondary | ICD-10-CM | POA: Diagnosis not present

## 2023-01-11 DIAGNOSIS — Z7902 Long term (current) use of antithrombotics/antiplatelets: Secondary | ICD-10-CM

## 2023-01-11 DIAGNOSIS — G20B1 Parkinson's disease with dyskinesia, without mention of fluctuations: Secondary | ICD-10-CM | POA: Diagnosis not present

## 2023-01-11 DIAGNOSIS — G20A1 Parkinson's disease without dyskinesia, without mention of fluctuations: Secondary | ICD-10-CM | POA: Diagnosis present

## 2023-01-11 DIAGNOSIS — I69320 Aphasia following cerebral infarction: Secondary | ICD-10-CM | POA: Diagnosis not present

## 2023-01-11 DIAGNOSIS — R471 Dysarthria and anarthria: Secondary | ICD-10-CM | POA: Diagnosis not present

## 2023-01-11 DIAGNOSIS — R29810 Facial weakness: Secondary | ICD-10-CM | POA: Diagnosis not present

## 2023-01-11 DIAGNOSIS — Z8673 Personal history of transient ischemic attack (TIA), and cerebral infarction without residual deficits: Secondary | ICD-10-CM

## 2023-01-11 DIAGNOSIS — J45909 Unspecified asthma, uncomplicated: Secondary | ICD-10-CM | POA: Diagnosis present

## 2023-01-11 DIAGNOSIS — M1711 Unilateral primary osteoarthritis, right knee: Principal | ICD-10-CM | POA: Diagnosis present

## 2023-01-11 DIAGNOSIS — D62 Acute posthemorrhagic anemia: Secondary | ICD-10-CM | POA: Diagnosis present

## 2023-01-11 DIAGNOSIS — Z96651 Presence of right artificial knee joint: Secondary | ICD-10-CM | POA: Diagnosis not present

## 2023-01-11 DIAGNOSIS — G250 Essential tremor: Secondary | ICD-10-CM | POA: Diagnosis present

## 2023-01-11 DIAGNOSIS — Z8249 Family history of ischemic heart disease and other diseases of the circulatory system: Secondary | ICD-10-CM | POA: Diagnosis not present

## 2023-01-11 DIAGNOSIS — K219 Gastro-esophageal reflux disease without esophagitis: Secondary | ICD-10-CM | POA: Diagnosis present

## 2023-01-11 DIAGNOSIS — R29710 NIHSS score 10: Secondary | ICD-10-CM | POA: Diagnosis not present

## 2023-01-11 DIAGNOSIS — Z8546 Personal history of malignant neoplasm of prostate: Secondary | ICD-10-CM | POA: Diagnosis not present

## 2023-01-11 DIAGNOSIS — I6389 Other cerebral infarction: Secondary | ICD-10-CM | POA: Diagnosis not present

## 2023-01-11 DIAGNOSIS — I69398 Other sequelae of cerebral infarction: Secondary | ICD-10-CM | POA: Diagnosis not present

## 2023-01-11 DIAGNOSIS — I951 Orthostatic hypotension: Secondary | ICD-10-CM | POA: Diagnosis not present

## 2023-01-11 DIAGNOSIS — K5901 Slow transit constipation: Secondary | ICD-10-CM | POA: Diagnosis not present

## 2023-01-11 HISTORY — PX: TOTAL KNEE ARTHROPLASTY: SHX125

## 2023-01-11 LAB — GLUCOSE, CAPILLARY: Glucose-Capillary: 124 mg/dL — ABNORMAL HIGH (ref 70–99)

## 2023-01-11 SURGERY — ARTHROPLASTY, KNEE, TOTAL
Anesthesia: Monitor Anesthesia Care | Site: Knee | Laterality: Right

## 2023-01-11 MED ORDER — ACETAMINOPHEN 325 MG PO TABS
325.0000 mg | ORAL_TABLET | Freq: Four times a day (QID) | ORAL | Status: DC | PRN
  Administered 2023-01-15: 650 mg via ORAL
  Filled 2023-01-11 (×2): qty 2

## 2023-01-11 MED ORDER — ONDANSETRON HCL 4 MG PO TABS: 4.0000 mg | ORAL_TABLET | Freq: Three times a day (TID) | ORAL | 0 refills | Status: AC | PRN

## 2023-01-11 MED ORDER — ACETAMINOPHEN 10 MG/ML IV SOLN
INTRAVENOUS | Status: AC
  Administered 2023-01-11: 1000 mg via INTRAVENOUS
  Filled 2023-01-11: qty 100

## 2023-01-11 MED ORDER — BUPIVACAINE LIPOSOME 1.3 % IJ SUSP
INTRAMUSCULAR | Status: DC | PRN
  Administered 2023-01-11: 20 mL

## 2023-01-11 MED ORDER — POLYETHYLENE GLYCOL 3350 17 G PO PACK: 17.0000 g | PACK | Freq: Every day | ORAL | 0 refills | Status: DC

## 2023-01-11 MED ORDER — HYDROMORPHONE HCL 1 MG/ML IJ SOLN: 0.2500 mg | INTRAMUSCULAR | Status: DC | PRN

## 2023-01-11 MED ORDER — ROPIVACAINE HCL 5 MG/ML IJ SOLN
INTRAMUSCULAR | Status: DC | PRN
  Administered 2023-01-11 (×6): 5 mL via PERINEURAL

## 2023-01-11 MED ORDER — BUPIVACAINE-EPINEPHRINE 0.25% -1:200000 IJ SOLN
INTRAMUSCULAR | Status: DC | PRN
  Administered 2023-01-11: 30 mL

## 2023-01-11 MED ORDER — DEXAMETHASONE SODIUM PHOSPHATE 10 MG/ML IJ SOLN
8.0000 mg | Freq: Once | INTRAMUSCULAR | Status: AC
  Administered 2023-01-11: 10 mg via INTRAVENOUS

## 2023-01-11 MED ORDER — BUPIVACAINE LIPOSOME 1.3 % IJ SUSP
INTRAMUSCULAR | Status: AC
  Filled 2023-01-11: qty 20

## 2023-01-11 MED ORDER — SODIUM CHLORIDE (PF) 0.9 % IJ SOLN
INTRAMUSCULAR | Status: AC
  Filled 2023-01-11: qty 50

## 2023-01-11 MED ORDER — ONDANSETRON HCL 4 MG/2ML IJ SOLN
INTRAMUSCULAR | Status: AC
  Filled 2023-01-11: qty 2

## 2023-01-11 MED ORDER — OXYCODONE HCL 5 MG PO TABS
ORAL_TABLET | ORAL | Status: AC
  Filled 2023-01-11: qty 1

## 2023-01-11 MED ORDER — ACETAMINOPHEN 500 MG PO TABS
1000.0000 mg | ORAL_TABLET | Freq: Four times a day (QID) | ORAL | Status: AC
  Administered 2023-01-12: 1000 mg via ORAL
  Filled 2023-01-11 (×2): qty 2

## 2023-01-11 MED ORDER — HYDROMORPHONE HCL 1 MG/ML IJ SOLN
INTRAMUSCULAR | Status: AC
  Administered 2023-01-11: 0.5 mg via INTRAVENOUS
  Filled 2023-01-11: qty 1

## 2023-01-11 MED ORDER — ORAL CARE MOUTH RINSE: 15.0000 mL | Freq: Once | OROMUCOSAL | Status: AC

## 2023-01-11 MED ORDER — PROPOFOL 1000 MG/100ML IV EMUL
INTRAVENOUS | Status: AC
  Filled 2023-01-11: qty 100

## 2023-01-11 MED ORDER — TRANEXAMIC ACID-NACL 1000-0.7 MG/100ML-% IV SOLN
1000.0000 mg | INTRAVENOUS | Status: AC
Start: 2023-01-11 — End: 2023-01-11
  Administered 2023-01-11: 1000 mg via INTRAVENOUS
  Filled 2023-01-11: qty 100

## 2023-01-11 MED ORDER — KETOROLAC TROMETHAMINE 15 MG/ML IJ SOLN
INTRAMUSCULAR | Status: AC
  Administered 2023-01-11: 7.5 mg via INTRAVENOUS
  Filled 2023-01-11: qty 1

## 2023-01-11 MED ORDER — SODIUM CHLORIDE 0.9 % IV SOLN: INTRAVENOUS | Status: DC

## 2023-01-11 MED ORDER — CEFAZOLIN SODIUM-DEXTROSE 2-4 GM/100ML-% IV SOLN
2.0000 g | INTRAVENOUS | Status: AC
  Administered 2023-01-11: 2 g via INTRAVENOUS
  Filled 2023-01-11: qty 100

## 2023-01-11 MED ORDER — ACETAMINOPHEN 10 MG/ML IV SOLN: 1000.0000 mg | Freq: Once | INTRAVENOUS | Status: DC | PRN

## 2023-01-11 MED ORDER — DEXAMETHASONE SODIUM PHOSPHATE 10 MG/ML IJ SOLN
INTRAMUSCULAR | Status: AC
  Filled 2023-01-11: qty 1

## 2023-01-11 MED ORDER — METHOCARBAMOL 500 MG PO TABS
500.0000 mg | ORAL_TABLET | Freq: Four times a day (QID) | ORAL | Status: DC | PRN
  Administered 2023-01-12 – 2023-01-15 (×7): 500 mg via ORAL
  Filled 2023-01-11 (×7): qty 1

## 2023-01-11 MED ORDER — WATER FOR IRRIGATION, STERILE IR SOLN
Status: DC | PRN
  Administered 2023-01-11: 1000 mL

## 2023-01-11 MED ORDER — OXYCODONE HCL 5 MG PO TABS: 5.0000 mg | ORAL_TABLET | ORAL | 0 refills | Status: DC | PRN

## 2023-01-11 MED ORDER — CLONIDINE HCL (ANALGESIA) 100 MCG/ML EP SOLN
EPIDURAL | Status: DC | PRN
  Administered 2023-01-11: 100 ug

## 2023-01-11 MED ORDER — OXYCODONE HCL 5 MG PO TABS
5.0000 mg | ORAL_TABLET | ORAL | Status: DC | PRN
Start: 2023-01-11 — End: 2023-01-15
  Administered 2023-01-12 – 2023-01-14 (×4): 10 mg via ORAL
  Administered 2023-01-15: 5 mg via ORAL
  Filled 2023-01-11 (×6): qty 2

## 2023-01-11 MED ORDER — DEXAMETHASONE SODIUM PHOSPHATE 10 MG/ML IJ SOLN
INTRAMUSCULAR | Status: DC | PRN
  Administered 2023-01-11: 10 mg

## 2023-01-11 MED ORDER — ACETAMINOPHEN 500 MG PO TABS: 1000.0000 mg | ORAL_TABLET | Freq: Three times a day (TID) | ORAL | Status: DC | PRN

## 2023-01-11 MED ORDER — ONDANSETRON HCL 4 MG PO TABS: 4.0000 mg | ORAL_TABLET | Freq: Four times a day (QID) | ORAL | Status: DC | PRN

## 2023-01-11 MED ORDER — EPHEDRINE 5 MG/ML INJ
INTRAVENOUS | Status: AC
  Filled 2023-01-11: qty 5

## 2023-01-11 MED ORDER — CHLORHEXIDINE GLUCONATE 0.12 % MT SOLN
15.0000 mL | Freq: Once | OROMUCOSAL | Status: AC
  Administered 2023-01-11: 15 mL via OROMUCOSAL

## 2023-01-11 MED ORDER — BUPIVACAINE IN DEXTROSE 0.75-8.25 % IT SOLN
INTRATHECAL | Status: DC | PRN
  Administered 2023-01-11: 1.6 mL via INTRATHECAL

## 2023-01-11 MED ORDER — HYDROMORPHONE HCL 1 MG/ML IJ SOLN
INTRAMUSCULAR | Status: AC
  Administered 2023-01-11: 0.25 mg via INTRAVENOUS
  Filled 2023-01-11: qty 1

## 2023-01-11 MED ORDER — BUPIVACAINE LIPOSOME 1.3 % IJ SUSP: 20.0000 mL | Freq: Once | INTRAMUSCULAR | Status: DC

## 2023-01-11 MED ORDER — POVIDONE-IODINE 10 % EX SWAB: 2.0000 | Freq: Once | CUTANEOUS | Status: DC

## 2023-01-11 MED ORDER — CLOPIDOGREL BISULFATE 75 MG PO TABS: 75.0000 mg | ORAL_TABLET | Freq: Every day | ORAL | Status: DC

## 2023-01-11 MED ORDER — CLOPIDOGREL BISULFATE 75 MG PO TABS
75.0000 mg | ORAL_TABLET | Freq: Every day | ORAL | Status: DC
  Administered 2023-01-12: 75 mg via ORAL
  Filled 2023-01-11 (×3): qty 1

## 2023-01-11 MED ORDER — PHENYLEPHRINE HCL-NACL 20-0.9 MG/250ML-% IV SOLN
INTRAVENOUS | Status: DC | PRN
  Administered 2023-01-11: 15 ug/min via INTRAVENOUS

## 2023-01-11 MED ORDER — EPHEDRINE SULFATE-NACL 50-0.9 MG/10ML-% IV SOSY
PREFILLED_SYRINGE | INTRAVENOUS | Status: DC | PRN
  Administered 2023-01-11: 5 mg via INTRAVENOUS

## 2023-01-11 MED ORDER — METHOCARBAMOL 500 MG PO TABS: 500.0000 mg | ORAL_TABLET | Freq: Three times a day (TID) | ORAL | 0 refills | Status: DC | PRN

## 2023-01-11 MED ORDER — HYDROMORPHONE HCL 1 MG/ML IJ SOLN
0.2500 mg | INTRAMUSCULAR | Status: DC | PRN
  Administered 2023-01-11: 0.25 mg via INTRAVENOUS
  Administered 2023-01-11: 0.5 mg via INTRAVENOUS
  Administered 2023-01-11: 0.25 mg via INTRAVENOUS
  Administered 2023-01-11: 0.5 mg via INTRAVENOUS

## 2023-01-11 MED ORDER — BUPIVACAINE-EPINEPHRINE 0.25% -1:200000 IJ SOLN
INTRAMUSCULAR | Status: AC
  Filled 2023-01-11: qty 1

## 2023-01-11 MED ORDER — ISOPROPYL ALCOHOL 70 % SOLN
Status: DC | PRN
  Administered 2023-01-11: 1 via TOPICAL

## 2023-01-11 MED ORDER — IOHEXOL 350 MG/ML SOLN
75.0000 mL | Freq: Once | INTRAVENOUS | Status: AC | PRN
  Administered 2023-01-11: 75 mL via INTRAVENOUS

## 2023-01-11 MED ORDER — SODIUM CHLORIDE 0.9 % IV SOLN: 12.5000 mg | INTRAVENOUS | Status: DC | PRN

## 2023-01-11 MED ORDER — KETOROLAC TROMETHAMINE 15 MG/ML IJ SOLN
7.5000 mg | Freq: Four times a day (QID) | INTRAMUSCULAR | Status: AC
  Administered 2023-01-12 (×3): 7.5 mg via INTRAVENOUS
  Filled 2023-01-11 (×3): qty 1

## 2023-01-11 MED ORDER — HYDROMORPHONE HCL 1 MG/ML IJ SOLN
0.5000 mg | INTRAMUSCULAR | Status: DC | PRN
  Administered 2023-01-11: 0.5 mg via INTRAVENOUS
  Administered 2023-01-12 – 2023-01-13 (×5): 1 mg via INTRAVENOUS
  Filled 2023-01-11 (×5): qty 1

## 2023-01-11 MED ORDER — LACTATED RINGERS IV SOLN: INTRAVENOUS | Status: DC

## 2023-01-11 MED ORDER — ASPIRIN 81 MG PO TBEC: 81.0000 mg | DELAYED_RELEASE_TABLET | Freq: Two times a day (BID) | ORAL | Status: DC

## 2023-01-11 MED ORDER — SODIUM CHLORIDE (PF) 0.9 % IJ SOLN
INTRAMUSCULAR | Status: AC
Start: 2023-01-11 — End: ?
  Filled 2023-01-11: qty 50

## 2023-01-11 MED ORDER — CELECOXIB 100 MG PO CAPS: 100.0000 mg | ORAL_CAPSULE | Freq: Two times a day (BID) | ORAL | 0 refills | Status: DC

## 2023-01-11 MED ORDER — ASPIRIN 81 MG PO CHEW
81.0000 mg | CHEWABLE_TABLET | Freq: Every day | ORAL | Status: DC
  Administered 2023-01-11: 81 mg via ORAL
  Filled 2023-01-11 (×2): qty 1

## 2023-01-11 MED ORDER — SODIUM CHLORIDE 0.9% FLUSH
INTRAVENOUS | Status: DC | PRN
  Administered 2023-01-11: 30 mL

## 2023-01-11 MED ORDER — ONDANSETRON HCL 4 MG/2ML IJ SOLN
INTRAMUSCULAR | Status: DC | PRN
  Administered 2023-01-11: 4 mg via INTRAVENOUS

## 2023-01-11 MED ORDER — ASPIRIN 300 MG RE SUPP
300.0000 mg | Freq: Every day | RECTAL | Status: DC
Start: 2023-01-11 — End: 2023-01-12
  Filled 2023-01-11: qty 1

## 2023-01-11 MED ORDER — 0.9 % SODIUM CHLORIDE (POUR BTL) OPTIME
TOPICAL | Status: DC | PRN
  Administered 2023-01-11: 1000 mL

## 2023-01-11 MED ORDER — ONDANSETRON HCL 4 MG/2ML IJ SOLN: 4.0000 mg | Freq: Four times a day (QID) | INTRAMUSCULAR | Status: DC | PRN

## 2023-01-11 MED ORDER — SODIUM CHLORIDE 0.9 % IR SOLN
Status: DC | PRN
  Administered 2023-01-11: 1000 mL

## 2023-01-11 MED ORDER — ONDANSETRON HCL 4 MG/2ML IJ SOLN: 4.0000 mg | Freq: Once | INTRAMUSCULAR | Status: DC | PRN

## 2023-01-11 MED ORDER — CEFAZOLIN SODIUM-DEXTROSE 2-4 GM/100ML-% IV SOLN
2.0000 g | Freq: Four times a day (QID) | INTRAVENOUS | Status: AC
  Administered 2023-01-11 – 2023-01-12 (×2): 2 g via INTRAVENOUS
  Filled 2023-01-11: qty 100

## 2023-01-11 MED ORDER — METHOCARBAMOL 1000 MG/10ML IJ SOLN: 500.0000 mg | Freq: Four times a day (QID) | INTRAMUSCULAR | Status: DC | PRN

## 2023-01-11 MED ORDER — ACETAMINOPHEN 500 MG PO TABS
1000.0000 mg | ORAL_TABLET | Freq: Once | ORAL | Status: DC
  Filled 2023-01-11: qty 2

## 2023-01-11 MED ORDER — PROPOFOL 500 MG/50ML IV EMUL
INTRAVENOUS | Status: DC | PRN
  Administered 2023-01-11: 40 ug/kg/min via INTRAVENOUS

## 2023-01-11 MED ORDER — FENTANYL CITRATE PF 50 MCG/ML IJ SOSY
50.0000 ug | PREFILLED_SYRINGE | INTRAMUSCULAR | Status: AC
  Administered 2023-01-11: 100 ug via INTRAVENOUS
  Filled 2023-01-11: qty 2

## 2023-01-11 MED ORDER — CEFAZOLIN SODIUM-DEXTROSE 2-4 GM/100ML-% IV SOLN
INTRAVENOUS | Status: AC
  Filled 2023-01-11: qty 100

## 2023-01-11 SURGICAL SUPPLY — 54 items
BAG COUNTER SPONGE SURGICOUNT (BAG) IMPLANT
BLADE SAG 18X100X1.27 (BLADE) ×1 IMPLANT
BLADE SAW SAG 35X64 .89 (BLADE) ×1 IMPLANT
BNDG COHESIVE 3X5 TAN ST LF (GAUZE/BANDAGES/DRESSINGS) ×1 IMPLANT
BNDG ELASTIC 6X10 VLCR STRL LF (GAUZE/BANDAGES/DRESSINGS) ×1 IMPLANT
BOWL SMART MIX CTS (DISPOSABLE) ×1 IMPLANT
CEMENT BONE R 1X40 (Cement) IMPLANT
CEMENT BONE REFOBACIN R1X40 US (Cement) IMPLANT
CHLORAPREP W/TINT 26 (MISCELLANEOUS) ×2 IMPLANT
COMP FEM CMT PERSONA SZ10 RT (Joint) ×1 IMPLANT
COMPONENT FEM CMT PRNSA SZ10RT (Joint) IMPLANT
COVER SURGICAL LIGHT HANDLE (MISCELLANEOUS) ×1 IMPLANT
CUFF TRNQT CYL 34X4.125X (TOURNIQUET CUFF) ×1 IMPLANT
DERMABOND ADVANCED .7 DNX12 (GAUZE/BANDAGES/DRESSINGS) ×1 IMPLANT
DRAPE INCISE IOBAN 85X60 (DRAPES) ×1 IMPLANT
DRAPE SHEET LG 3/4 BI-LAMINATE (DRAPES) ×1 IMPLANT
DRAPE U-SHAPE 47X51 STRL (DRAPES) ×1 IMPLANT
DRSG AQUACEL AG ADV 3.5X10 (GAUZE/BANDAGES/DRESSINGS) ×1 IMPLANT
ELECT REM PT RETURN 15FT ADLT (MISCELLANEOUS) ×1 IMPLANT
GAUZE SPONGE 4X4 12PLY STRL (GAUZE/BANDAGES/DRESSINGS) ×1 IMPLANT
GLOVE BIO SURGEON STRL SZ 6.5 (GLOVE) ×2 IMPLANT
GLOVE BIOGEL PI IND STRL 6.5 (GLOVE) ×1 IMPLANT
GLOVE BIOGEL PI IND STRL 8 (GLOVE) ×1 IMPLANT
GLOVE SURG ORTHO 8.0 STRL STRW (GLOVE) ×2 IMPLANT
GOWN STRL REUS W/ TWL XL LVL3 (GOWN DISPOSABLE) ×2 IMPLANT
HOLDER FOLEY CATH W/STRAP (MISCELLANEOUS) ×1 IMPLANT
HOOD PEEL AWAY T7 (MISCELLANEOUS) ×3 IMPLANT
INSERT TIBIAL PERSONA 10 RT (Insert) IMPLANT
KIT TURNOVER KIT A (KITS) IMPLANT
MANIFOLD NEPTUNE II (INSTRUMENTS) ×1 IMPLANT
MARKER SKIN DUAL TIP RULER LAB (MISCELLANEOUS) ×1 IMPLANT
NS IRRIG 1000ML POUR BTL (IV SOLUTION) ×1 IMPLANT
PACK TOTAL KNEE CUSTOM (KITS) ×1 IMPLANT
PIN DRILL HDLS TROCAR 75 4PK (PIN) IMPLANT
SCREW HEADED 33MM KNEE (MISCELLANEOUS) IMPLANT
SET HNDPC FAN SPRY TIP SCT (DISPOSABLE) ×1 IMPLANT
SOLUTION IRRIG SURGIPHOR (IV SOLUTION) IMPLANT
SPIKE FLUID TRANSFER (MISCELLANEOUS) ×1 IMPLANT
STEM POLY PAT PLY 41M KNEE (Knees) IMPLANT
STEM TIBIA 5 DEG SZ G R KNEE (Knees) IMPLANT
STRIP CLOSURE SKIN 1/2X4 (GAUZE/BANDAGES/DRESSINGS) ×1 IMPLANT
SUT MNCRL AB 3-0 PS2 18 (SUTURE) ×1 IMPLANT
SUT STRATAFIX 0 PDS 27 VIOLET (SUTURE) ×1
SUT STRATAFIX 14 PDO 48 VLT (SUTURE) ×1 IMPLANT
SUT STRATAFIX PDO 1 14 VIOLET (SUTURE) ×1
SUT VIC AB 2-0 CT2 27 (SUTURE) ×2 IMPLANT
SUTURE STRATFX 0 PDS 27 VIOLET (SUTURE) ×1 IMPLANT
SYR 50ML LL SCALE MARK (SYRINGE) ×1 IMPLANT
TIBIA STEM 5 DEG SZ G R KNEE (Knees) ×1 IMPLANT
TIBIAL INSERT PERSONA 10 RT (Insert) ×1 IMPLANT
TRAY FOLEY MTR SLVR 14FR STAT (SET/KITS/TRAYS/PACK) IMPLANT
TUBE SUCTION HIGH CAP CLEAR NV (SUCTIONS) ×1 IMPLANT
UNDERPAD 30X36 HEAVY ABSORB (UNDERPADS AND DIAPERS) ×1 IMPLANT
WRAP KNEE MAXI GEL POST OP (GAUZE/BANDAGES/DRESSINGS) IMPLANT

## 2023-01-11 NOTE — Anesthesia Procedure Notes (Addendum)
Anesthesia Regional Block: Adductor canal block   Pre-Anesthetic Checklist: , timeout performed,  Correct Patient, Correct Site, Correct Laterality,  Correct Procedure, Correct Position, site marked,  Risks and benefits discussed,  Surgical consent,  Pre-op evaluation,  At surgeon's request and post-op pain management  Laterality: Lower and Right  Prep: chloraprep       Needles:  Injection technique: Single-shot  Needle Type: Echogenic Stimulator Needle     Needle Length: 9cm  Needle Gauge: 20   Needle insertion depth: 2.5 cm   Additional Needles:   Procedures:,,,, ultrasound used (permanent image in chart),,    Narrative:  Start time: 01/11/2023 8:45 AM End time: 01/11/2023 8:53 AM Injection made incrementally with aspirations every 5 mL.  Performed by: Personally  Anesthesiologist: Leilani Able, MD

## 2023-01-11 NOTE — Progress Notes (Signed)
Orthopedic Tech Progress Note Patient Details:  Joshua [name] 11/11/1945 [id number]  Ortho Devices Type of Ortho Device: Bone foam zero knee Ortho Device/Splint Location: RLE Ortho Device/Splint Interventions: Ordered, Application, Adjustment   Post Interventions Patient Tolerated: Well Instructions Provided: Care of device  [place] A Gerilyn Pilgrim 01/11/2023, 11:48 AM

## 2023-01-11 NOTE — Interval H&P Note (Signed)
The patient has been re-examined, and the chart reviewed, and there have been no interval changes to the documented history and physical.    Plan for R knee OA  The operative side was examined and the patient was confirmed to have sensation to DPN, SPN, TN intact, Motor EHL, ext, flex 5/5, and DP 2+, PT 2+, No significant edema.   The risks, benefits, and alternatives have been discussed at length with patient, and the patient is willing to proceed.  Right knee marked. Consent has been signed.

## 2023-01-11 NOTE — Anesthesia Procedure Notes (Signed)
Procedure Name: MAC Date/Time: 01/11/2023 9:01 AM  Performed by: Elisabeth Cara, CRNAPre-anesthesia Checklist: Patient identified, Emergency Drugs available, Suction available, Patient being monitored and Timeout performed Patient Re-evaluated:Patient Re-evaluated prior to induction Oxygen Delivery Method: Simple face mask Placement Confirmation: positive ETCO2 Dental Injury: Teeth and Oropharynx as per pre-operative assessment

## 2023-01-11 NOTE — Progress Notes (Signed)
Code stroke cart activated at 1254. Per Rapid Response RN, primary RN at bedside recognized symptoms at 1200.  Dr. Selina Cooley paged at 1255. Pt arrived in CT at 1256. Dr. Selina Cooley called this TS RN at 1302 and logged on screen at 1303.  Ricci Barker, Tele Stroke RN

## 2023-01-11 NOTE — Discharge Instructions (Signed)
INSTRUCTIONS AFTER JOINT REPLACEMENT   Remove items at home which could result in a fall. This includes throw rugs or furniture in walking pathways ICE to the affected joint every three hours while awake for 30 minutes at a time, for at least the first 3-5 days, and then as needed for pain and swelling.  Continue to use ice for pain and swelling. You may notice swelling that will progress down to the foot and ankle.  This is normal after surgery.  Elevate your leg when you are not up walking on it.   Continue to use the breathing machine you got in the hospital (incentive spirometer) which will help keep your temperature down.  It is common for your temperature to cycle up and down following surgery, especially at night when you are not up moving around and exerting yourself.  The breathing machine keeps your lungs expanded and your temperature down.  DIET:  As you were doing prior to hospitalization, we recommend a well-balanced diet.  DRESSING / WOUND CARE / SHOWERING:  Keep the surgical dressing until follow up.  The dressing is water proof, so you can shower without any extra covering.  IF THE DRESSING FALLS OFF or the wound gets wet inside, change the dressing with sterile gauze.  Please use good hand washing techniques before changing the dressing.  Do not use any lotions or creams on the incision until instructed by your surgeon.    ACTIVITY  Increase activity slowly as tolerated, but follow the weight bearing instructions below.   No driving for 6 weeks or until further direction given by your physician.  You cannot drive while taking narcotics.  No lifting or carrying greater than 10 lbs. until further directed by your surgeon. Avoid periods of inactivity such as sitting longer than an hour when not asleep. This helps prevent blood clots.  You may return to work once you are authorized by your doctor.   WEIGHT BEARING: Weight bearing as tolerated with assist device (walker, cane, etc) as  directed, use it as long as suggested by your surgeon or therapist, typically at least 4-6 weeks.  EXERCISES  Results after joint replacement surgery are often greatly improved when you follow the exercise, range of motion and muscle strengthening exercises prescribed by your doctor. Safety measures are also important to protect the joint from further injury. Any time any of these exercises cause you to have increased pain or swelling, decrease what you are doing until you are comfortable again and then slowly increase them. If you have problems or questions, call your caregiver or physical therapist for advice.   Rehabilitation is important following a joint replacement. After just a few days of immobilization, the muscles of the leg can become weakened and shrink (atrophy).  These exercises are designed to build up the tone and strength of the thigh and leg muscles and to improve motion. Often times heat used for twenty to thirty minutes before working out will loosen up your tissues and help with improving the range of motion but do not use heat for the first two weeks following surgery (sometimes heat can increase post-operative swelling).   These exercises can be done on a training (exercise) mat, on the floor, on a table or on a bed. Use whatever works the best and is most comfortable for you.    Use music or television while you are exercising so that the exercises are a pleasant break in your day. This will make your life  better with the exercises acting as a break in your routine that you can look forward to.   Perform all exercises about fifteen times, three times per day or as directed.  You should exercise both the operative leg and the other leg as well.  Exercises include:   Quad Sets - Tighten up the muscle on the front of the thigh (Quad) and hold for 5-10 seconds.   Straight Leg Raises - With your knee straight (if you were given a brace, keep it on), lift the leg to 60 degrees, hold  for 3 seconds, and slowly lower the leg.  Perform this exercise against resistance later as your leg gets stronger.  Leg Slides: Lying on your back, slowly slide your foot toward your buttocks, bending your knee up off the floor (only go as far as is comfortable). Then slowly slide your foot back down until your leg is flat on the floor again.  Angel Wings: Lying on your back spread your legs to the side as far apart as you can without causing discomfort.  Hamstring Strength:  Lying on your back, push your heel against the floor with your leg straight by tightening up the muscles of your buttocks.  Repeat, but this time bend your knee to a comfortable angle, and push your heel against the floor.  You may put a pillow under the heel to make it more comfortable if necessary.   A rehabilitation program following joint replacement surgery can speed recovery and prevent re-injury in the future due to weakened muscles. Contact your doctor or a physical therapist for more information on knee rehabilitation.   CONSTIPATION:  Constipation is defined medically as fewer than three stools per week and severe constipation as less than one stool per week.  Even if you have a regular bowel pattern at home, your normal regimen is likely to be disrupted due to multiple reasons following surgery.  Combination of anesthesia, postoperative narcotics, change in appetite and fluid intake all can affect your bowels.   YOU MUST use at least one of the following options; they are listed in order of increasing strength to get the job done.  They are all available over the counter, and you may need to use some, POSSIBLY even all of these options:    Drink plenty of fluids (prune juice may be helpful) and high fiber foods Colace 100 mg by mouth twice a day  Senokot for constipation as directed and as needed Dulcolax (bisacodyl), take with full glass of water  Miralax (polyethylene glycol) once or twice a day as needed.  If you  have tried all these things and are unable to have a bowel movement in the first 3-4 days after surgery call either your surgeon or your primary doctor.    If you experience loose stools or diarrhea, hold the medications until you stool forms back up.  If your symptoms do not get better within 1 week or if they get worse, check with your doctor.  If you experience "the worst abdominal pain ever" or develop nausea or vomiting, please contact the office immediately for further recommendations for treatment.  ITCHING:  If you experience itching with your medications, try taking only a single pain pill, or even half a pain pill at a time.  You can also use Benadryl over the counter for itching or also to help with sleep.   TED HOSE STOCKINGS:  Use stockings on both legs until for at least 2 weeks or  as directed by physician office. They may be removed at night for sleeping.  MEDICATIONS:  See your medication summary on the "After Visit Summary" that nursing will review with you.  You may have some home medications which will be placed on hold until you complete the course of blood thinner medication.  It is important for you to complete the blood thinner medication as prescribed.  Blood clot prevention (DVT Prophylaxis): After surgery you are at an increased risk for a blood clot. You were prescribed a blood thinner, Aspirin 81mg , to be taken twice daily for a total of 4 weeks from surgery to help reduce your risk of getting a blood clot.  This is in addition to resuming your Plavix 2 days after surgery (this Wednesday).  Signs of a pulmonary embolus (blood clot in the lungs) include sudden short of breath, feeling lightheaded or dizzy, chest pain with a deep breath, rapid pulse rapid breathing.  Signs of a blood clot in your arms or legs include new unexplained swelling and cramping, warm, red or darkened skin around the painful area.  Please call the office or 911 right away if these signs or symptoms  develop.  PRECAUTIONS:   If you experience chest pain or shortness of breath - call 911 immediately for transfer to the hospital emergency department.   If you develop a fever greater that 101 F, purulent drainage from wound, increased redness or drainage from wound, foul odor from the wound/dressing, or calf pain - CONTACT YOUR SURGEON.                                                   FOLLOW-UP APPOINTMENTS:  If you do not already have a post-op appointment, please call the office for an appointment to be seen by your surgeon.  Guidelines for how soon to be seen are listed in your "After Visit Summary", but are typically between 2-3 weeks after surgery.  If you have a specialized bandage, you may be told to follow up 1 week after surgery.  OTHER INSTRUCTIONS:  Knee Replacement:  Do not place pillow under knee, focus on keeping the knee straight while resting.  Place foam block, curve side up under heel at all times except when walking.  DO NOT modify, tear, cut, or change the foam block in any way.  POST-OPERATIVE OPIOID TAPER INSTRUCTIONS: It is important to wean off of your opioid medication as soon as possible. If you do not need pain medication after your surgery it is ok to stop day one. Opioids include: Codeine, Hydrocodone(Norco, Vicodin), Oxycodone(Percocet, oxycontin) and hydromorphone amongst others.  Long term and even short term use of opiods can cause: Increased pain response Dependence Constipation Depression Respiratory depression And more.  Withdrawal symptoms can include Flu like symptoms Nausea, vomiting And more Techniques to manage these symptoms Hydrate well Eat regular healthy meals Stay active Use relaxation techniques(deep breathing, meditating, yoga) Do Not substitute Alcohol to help with tapering If you have been on opioids for less than two weeks and do not have pain than it is ok to stop all together.  Plan to wean off of opioids This plan should start  within one week post op of your joint replacement. Maintain the same interval or time between taking each dose and first decrease the dose.  Cut the total daily intake  of opioids by one tablet each day Next start to increase the time between doses. The last dose that should be eliminated is the evening dose.   MAKE SURE YOU:  Understand these instructions.  Get help right away if you are not doing well or get worse.    Thank you for letting us be a part of your medical care team.  It is a privilege we respect greatly.  We hope these instructions will help you stay on track for a fast and full recovery!

## 2023-01-11 NOTE — Progress Notes (Addendum)
Neuro assessment and NIHSS completed by Geoffry Paradise, RN (NIHSS certified)

## 2023-01-11 NOTE — Transfer of Care (Signed)
Immediate Anesthesia Transfer of Care Note  Patient: Joshua [name]  Procedure(s) Performed: TOTAL KNEE ARTHROPLASTY (Right: Knee)  Patient Location: PACU  Anesthesia Type:MAC and Spinal  Level of Consciousness: awake, alert , oriented, and patient cooperative  Airway & Oxygen Therapy: Patient Spontanous Breathing and Patient connected to face mask oxygen  Post-op Assessment: Report given to RN and Post -op Vital signs reviewed and stable  Post vital signs: Reviewed and stable  Last Vitals:  Vitals Value Taken Time  BP 118/77 01/11/23 1141  Temp    Pulse 71 01/11/23 1143  Resp 14 01/11/23 1143  SpO2 99 % 01/11/23 1143  Vitals shown include unfiled device data.  Last Pain:  Vitals:   01/11/23 0658  TempSrc: Oral         Complications: No notable events documented.

## 2023-01-11 NOTE — Progress Notes (Signed)
Patient woke up from anesthesia found to be lethargic and weakness in his right hand.  Code stroke was called.  Stroke team evaluated.  Patient stable and doing well.  Discussed with neurology team Dr. Selina Cooley.  Recommend admission to [facility] where the stroke team will follow.  Plan for Q2 neurochecks.  Will start aspirin today and Plavix resumed tomorrow.  Appreciate recs from neurology team.

## 2023-01-11 NOTE — Progress Notes (Addendum)
Primary RN  Christa noticed changes in neuro status from baseline upon arrival to PACU at 1200- anesthesia to bedside immediately - wife at bedside confirmed pt is not at his baseline.  R hand with very little movement/grip- trouble finding words slurred speech. Seems to comprehend what is being asked of him and is able to follow simple commands, but unable to speak coherently. Unable to perform complete stroke assessment r/t anesthesia and surgery just preformed on R knee. Pt is able to wiggles toes bilaterally. RUE strength < LUE strength.  LKN: immediately prior to surgery. Glucose 124 in PACU  Per anesthesia Hatchett MD: Code stroke called overhead at 1252  [facility] notified 1249 Rapid response notified 1251 Christa RN on emergency notification line at 1251 to call code stroke overhead.  Pt to CT 2 with RN x2 (Christa RN and Efraim Kaufmann RN) and CNA (Amy) at [employer]  Nurse manager, Whitney at bedside to assist 1253.  Wife at bedside and aware of calling of code stroke.

## 2023-01-11 NOTE — Progress Notes (Signed)
PT Cancellation Note  Patient Details Name: Joshua [name] MRN: [id number] DOB: 16-Sep-1945   Cancelled Treatment:    Reason Eval/Treat Not Completed: Medical issues which prohibited therapy. 1338 code stroke called. Pt s/p R TKA and in PACU at time of event. Pt to transfer to [facility] for further stroke work up.   Johnny Bridge, PT Acute Rehab   Jacqualyn Posey 01/11/2023, 4:05 PM

## 2023-01-11 NOTE — Progress Notes (Signed)
1330 wife at bedside. Neuro MD on telemonitor for stroke assessment. Zoey RR RN preformed stroke assessment at bedside with Selina Cooley MD on tele monitor. 1338 Concern for stroke discussed with wife by MD, plan relayed to wife by MD.   Awaiting MD orders after talking to surgery. Wife did ask about possibility of CPM for early mobility. Discussed with wife that this is an issue/topic that can be addressed after neuro has made recommendations, and talked to Dr. Blanchie Dessert. But it will ultimately be up to them if it is a safe option at this time.   Wife remains at bedside and voiced understanding of current plan and what to expect over the next hour or so.

## 2023-01-11 NOTE — Op Note (Signed)
DATE OF SURGERY:  01/11/2023 TIME: 11:04 AM  PATIENT NAME:  Joshua [name]   AGE: 77 y.o.    PRE-OPERATIVE DIAGNOSIS:  End-stage right knee osteoarthritis  POST-OPERATIVE DIAGNOSIS:  Same  PROCEDURE: Right total Knee Arthroplasty  SURGEON:  Philo Kurtz A Kayla Deshaies, MD   ASSISTANT: Kathie Dike, PA-C, present and scrubbed throughout the case, critical for assistance with exposure, retraction, instrumentation, and closure.   OPERATIVE IMPLANTS:  Cemented Zimmer persona size 10 right standard femur, right G tibial baseplate, 41 mm patella cemented, 10 mm MC poly insert Implant Name Type Inv. Item Serial No. Manufacturer Lot No. LRB No. Used Action  CEMENT BONE R 1X40 - [id number] Cement CEMENT BONE R 1X40  ZIMMER RECON(ORTH,TRAU,BIO,SG) [id number] Right 1 Implanted  CEMENT BONE R 1X40 - [id number] Cement CEMENT BONE R 1X40  ZIMMER RECON(ORTH,TRAU,BIO,SG) [id number] Right 1 Implanted  STEM POLY PAT PLY 70M KNEE - [id number] Knees STEM POLY PAT PLY 70M KNEE  ZIMMER RECON(ORTH,TRAU,BIO,SG) [id number] Right 1 Implanted  TIBIA STEM 5 DEG SZ G R KNEE - [id number] Knees TIBIA STEM 5 DEG SZ G R KNEE  ZIMMER RECON(ORTH,TRAU,BIO,SG) [id number] Right 1 Implanted  COMP FEM CMT PERSONA SZ10 RT - [id number] Joint COMP FEM CMT PERSONA SZ10 RT  ZIMMER RECON(ORTH,TRAU,BIO,SG) [id number] Right 1 Implanted  CEMENT BONE R 1X40 - [id number] Cement CEMENT BONE R 1X40  ZIMMER RECON(ORTH,TRAU,BIO,SG) [id number] Right 1 Implanted  TIBIAL INSERT PERSONA 10 RT - [id number] Insert TIBIAL INSERT PERSONA 10 RT  ZIMMER RECON(ORTH,TRAU,BIO,SG) [id number] Right 1 Implanted      PREOPERATIVE INDICATIONS:  Joshua [name] is a 77 y.o. year old male with end stage bone on bone degenerative arthritis of the knee who failed conservative treatment, including injections, antiinflammatories, activity modification, and assistive devices, and had significant impairment of their activities of daily living, and elected for  Total Knee Arthroplasty.   The risks, benefits, and alternatives were discussed at length including but not limited to the risks of infection, bleeding, nerve injury, stiffness, blood clots, the need for revision surgery, cardiopulmonary complications, among others, and they were willing to proceed.  ESTIMATED BLOOD LOSS: 50cc  OPERATIVE DESCRIPTION:   Once adequate anesthesia was induced, preoperative antibiotics, 2 gm of ancef,1 gm of Tranexamic Acid, and 8 mg of Decadron administered, the patient was positioned supine with a right thigh tourniquet placed.  The right lower extremity was prepped and draped in sterile fashion.  A time-  out was performed identifying the patient, planned procedure, and the appropriate extremity.     The leg was  exsanguinated, tourniquet elevated to 250 mmHg.  A midline incision was  made followed by median parapatellar arthrotomy. Anterior horn of the medial meniscus was released and resected. A medial release was performed, the infrapatellar fat pad was resected with care taken to protect the patellar tendon. The suprapatellar fat was removed to exposed the distal anterior femur. The anterior horn of the lateral meniscus and ACL were released.    Following initial  exposure, I first started with the femur  The femoral  canal was opened with a drill, canal was suctioned to try to prevent fat emboli.  An  intramedullary rod was passed set at 5 degrees valgus, 10 mm. The distal femur was resected.  Following this resection, the tibia was  subluxated anteriorly.  Using the extramedullary guide, 10 mm of bone was resected off   the proximal lateral tibia.  We confirmed the gap would be  stable medially and laterally with a size  10mm spacer block as well as confirmed that the tibial cut was perpendicular in the coronal plane, checking with an alignment rod.    Once this was done, the posterior femoral referencing femoral sizer was placed under to the posterior condyles  with 3 degrees of external rotational which was parallel to the transepicondylar axis and perpendicular to [employer]. The femur was sized to be a size 10 in the anterior-  posterior dimension. The  anterior, posterior, and  chamfer cuts were made without difficulty nor   notching making certain that I was along the anterior cortex to help  with flexion gap stability. Next a laminar spreader was placed with the knee in flexion and the medial lateral menisci were resected.  5 cc of the Exparel mixture was injected in the medial side of the back of the knee and 3 cc in the lateral side.  1/2 inch curved osteotome was used to resect posterior osteophyte that was then removed with a pituitary rongeur.       At this point, the tibia was sized to be a size G.  The size G tray was  then pinned in position. Trial reduction was now carried with a 10 femur, G tibia, a 10 mm MC insert.  The knee was tight in both flexion and extension so elected to resect additional 2 mm off the proximal tibia.  Trials were reinserted.  The knee had full extension and was stable to varus valgus stress in extension.  The knee was slightly tight in flexion and the PCL was partially released.    Attention was next directed to the patella.  Precut  measurement was noted to be 25 mm.  I resected down to 15 mm and used a  41mm patellar button to restore patellar height as well as cover the cut surface.     The patella lug holes were drilled and a 41mm patella poly trial was placed.    The knee was brought to full extension with good flexion stability with the patella tracking through the trochlea without application of pressure.     Next the femoral component was again assessed and determined to be seated and appropriately lateralized.  The femoral lug holes were drilled.  The femoral component was then removed. Tibial component was again assessed and felt to be seated and appropriately rotated with the medial third of the  tubercle. The tibia was then drilled, and keel punched.     Final components were  opened and cement was mixed.      Final implants were then  cemented onto cleaned and dried cut surfaces of bone with the knee brought to extension with a 10 mm MC poly.  The knee was irrigated with sterile Betadine diluted in saline as well as pulse lavage normal saline.The synovial lining was  then injected a dilute Exparel with 30cc of 0.25% marcaine with epinephrine.         Once the cement had fully cured, excess cement was removed throughout the knee.  I confirmed that I was satisfied with the range of motion and stability, and the final 10mm MC poly insert was chosen.  It was placed into the knee.         The tourniquet had been let down.  No significant hemostasis was required.  The medial parapatellar arthrotomy was then reapproximated using #1 Stratafix sutures with the knee  in flexion.  The remaining wound was closed with 0 stratafix, 2-0 Vicryl, and running  3-0 Monocryl. The knee was cleaned, dried, dressed sterilely using Dermabond and   Aquacel dressing.  The patient was then brought to recovery room in stable condition, tolerating the procedure  well. There were no complications.   Post op recs: WB: WBAT Abx: ancef Imaging: PACU xrays DVT prophylaxis: Aspirin 81mg  BID x4 weeks, resume plavix POD1 Follow up: 2 weeks after surgery for a wound check with Dr. Blanchie Dessert at [facility].  Address: [address], [place], [place] [address]  Office Phone: [phone]  Weber Cooks, MD Orthopaedic Surgery

## 2023-01-11 NOTE — Consult Note (Signed)
NEUROLOGY TELECONSULTATION NOTE   Date of service: January 11, 2023 Patient Name: Joshua [name] MRN:  [id number] DOB:  April 25, 1945 Reason for consult: telestroke  Requesting Provider: Dr. Weber Cooks Consult Participants: myself, patient, bedside RN, telestroke RN Location of the provider: [facility] Location of the patient: [facility]  This consult was provided via telemedicine with 2-way video and audio communication. The patient/family was informed that care would be provided in this way and agreed to receive care in this manner.   _ _ _   _ __   _ __ _ _  __ __   _ __   __ _  History of Present Illness   This is a 77 yo man with hx CAD, carotid disease, HL, hx loop recprder, HTN, MI, Parkinsons, Stroke 2017, and vertigo who developed acute onset R sided weakness and aphasia after R TKA today. LKW prior to ansesthesia at 0859 this AM. After he woke up from anesthesia he was initially lethargic but then his speech sounded garbled and then continued to worsen. RN paged MD to alert him at 1200. Patient's grip was noted to be weaker on the left than the right. Stroke code was activated, NIHSS was 10. CT showed no acute process on personal review. Patient was not eligible for TNK given invasive surgery this AM. CTA H&N showed no LVO therefore patient was not a candidate for intervention.   ROS   Per HPI; all other systems reviewed and are negative  Past History   The following was personally reviewed:  Past Medical History:  Diagnosis Date   Allergic rhinitis    Anxiety    from chronic pain from surgery- on Cymbalta   Arthritis    Asthma    Barrett's esophagus 03/29/2014   Carotid artery disease (HCC)    Carotid Doppler normal August, 2007   Coronary artery disease    Diverticulosis    Dyslipidemia    GERD (gastroesophageal reflux disease)    History of loop recorder    has since 04/04/15   HTN (hypertension)    takes Metoprolol for PVC control   Hx of colonic  polyps    adenomatous   IFG (impaired fasting glucose)    Myocardial infarction (HCC)    mild   Neuromuscular disorder (HCC)    parkinson's tremors in hands   Palpitations    Benign PVCs   Parkinson disease (HCC)    Pneumonia    Prostate cancer (HCC)    RBBB (right bundle branch block)    rate related   Shingles    Stroke (HCC) 2017   TIA (transient ischemic attack) 02/2015   Per pt, had 2 strokes   Vertigo    Past Surgical History:  Procedure Laterality Date   BACK SURGERY  2002,2009   x 6   CHOLECYSTECTOMY  11/30/2012   with IOC   COLONOSCOPY     ELECTROPHYSIOLOGIC STUDY N/A 09/26/2015   Procedure: V Tach Ablation (PVC);  Surgeon: Will Jorja Loa, MD;  Location: [facility] INVASIVE CV LAB;  Service: Cardiovascular;  Laterality: N/A;   EP IMPLANTABLE DEVICE N/A 04/04/2015   Procedure: Loop Recorder Insertion;  Surgeon: Will Jorja Loa, MD;  Location: [facility] INVASIVE CV LAB;  Service: Cardiovascular;  Laterality: N/A;   HERNIA REPAIR     laprascopic   KNEE SURGERY     DECEMBER 2017,LEFT KNEE SCOPED   LEFT HEART CATH AND CORONARY ANGIOGRAPHY N/A 02/05/2021   Procedure: LEFT HEART CATH AND CORONARY ANGIOGRAPHY;  Surgeon: Iran Ouch, MD;  Location:  [facility] INVASIVE CV LAB;  Service: Cardiovascular;  Laterality: N/A;   NECK SURGERY  2002   POLYPECTOMY     ROTATOR CUFF REPAIR Left    TEE WITHOUT CARDIOVERSION N/A 04/04/2015   Procedure: TRANSESOPHAGEAL ECHOCARDIOGRAM (TEE);  Surgeon: Lewayne Bunting, MD;  Location: [facility] ENDOSCOPY;  Service: Cardiovascular;  Laterality: N/A;   TOTAL KNEE ARTHROPLASTY Left 09/28/2022   Procedure: TOTAL KNEE ARTHROPLASTY;  Surgeon: Joen Laura, MD;  Location: [facility] ORS;  Service: Orthopedics;  Laterality: Left;   TRIGGER FINGER RELEASE Left 12/28/2019   Procedure: RELEASE TRIGGER FINGER/A-1 PULLEY THUMB, MIDDLE AND RING;  Surgeon: Cindee Salt, MD;  Location: [facility];  Service: Orthopedics;  Laterality: Left;  FAB   UPPER  GASTROINTESTINAL ENDOSCOPY     V Tach ablation  09/26/2015   Family History  Problem Relation Age of Onset   Hypertension Mother    Heart attack Father    Heart disease Father    Clotting disorder Father    Heart disease Brother    Lung cancer Brother    Prostate cancer Brother    Breast cancer Paternal Grandmother    Heart attack Paternal Grandfather    Healthy Daughter    Healthy Daughter    Esophageal cancer Neg Hx    Stomach cancer Neg Hx    Rectal cancer Neg Hx    Colon polyps Neg Hx    Social History   Socioeconomic History   Marital status: Married    Spouse name: [name]    Number of children: 2   Years of education: 15   Highest education level: Some college, no degree  Occupational History   Occupation: Retired    Comment: Social research officer, government  Tobacco Use   Smoking status: Never   Smokeless tobacco: Never  Vaping Use   Vaping status: Never Used  Substance and Sexual Activity   Alcohol use: No    Alcohol/week: 0.0 standard drinks of alcohol   Drug use: No   Sexual activity: Not Currently  Other Topics Concern   Not on file  Social History Narrative   Lives with wife.    Caffeine use: Coffee/tea/soda- ocassionally   Right-handed.   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 In Dextrose 5% [Ofloxacin] Other (See Comments)    Lowers BP    Terazosin Other (See Comments)    Lower bp    Medications   Medications  Prior to Admission  Medication Sig Dispense Refill Last Dose/Taking   acetaminophen (TYLENOL) 650 MG CR tablet Take 1,300 mg by mouth in the morning and at bedtime.   01/11/2023 at  4:30 AM   albuterol (VENTOLIN HFA) 108 (90 Base) MCG/ACT inhaler Inhale 2 puffs into the lungs every 4 (four) hours as needed for wheezing or shortness of breath.  Taking As Needed   azelastine (OPTIVAR) 0.05 % ophthalmic solution Place 1 drop into both eyes 2 (two) times daily.   Taking   Biotin 40981 MCG TABS Take 1,000 mcg by mouth in the morning.   Taking   carbidopa-levodopa (SINEMET CR) 50-200 MG tablet TAKE 1 TABLET BY MOUTH EVERYDAY AT BEDTIME 90 tablet 0 01/10/2023 Evening   carbidopa-levodopa (SINEMET IR) 25-100 MG tablet 2 TABLETS AT 7 AM, 2 TABLETS AT 10 AM,2 TABLETS AT 1PM,2 TABLETS AT 4 PM /2 TABLETS AT 7 PM 900 tablet 0 01/11/2023 at  4:30 AM   cholecalciferol (VITAMIN D3) 25 MCG (1000 UNIT) tablet Take 1,000 Units by mouth daily.   Taking   cyanocobalamin (VITAMIN B12) 1000 MCG tablet Take 1,000 mcg by mouth daily.   Past Week   DYMISTA 137-50 MCG/ACT SUSP Place 1 puff into both nostrils at bedtime.    01/10/2023 Evening   escitalopram (LEXAPRO) 20 MG tablet Take 20 mg by mouth daily.   01/11/2023 at  4:30 AM   finasteride (PROSCAR) 5 MG tablet Take 5 mg by mouth every evening.   01/10/2023 Noon   lansoprazole (PREVACID) 30 MG capsule Take 30 mg by mouth 2 (two) times daily. Morning & mid-afternoon   01/11/2023 at  4:30 AM   levocetirizine (XYZAL) 5 MG tablet Take 5 mg by mouth every evening.     01/10/2023 Evening   Menthol, Topical Analgesic, (BIOFREEZE EX) Apply 1 Application topically daily as needed (pain).   Past Week   metoprolol succinate (TOPROL-XL) 25 MG 24 hr tablet TAKE 1/2 TABLET BY MOUTH DAILY (Patient taking differently: Take 12.5 mg by mouth at bedtime.) 45 tablet 2 01/10/2023 at  9:00 PM   mexiletine (MEXITIL) 150 MG capsule TAKE 1 CAPSULE BY MOUTH TWICE A DAY 180 capsule 3 01/11/2023 at   4:30 AM   midodrine (PROAMATINE) 5 MG tablet TAKE 1 TABLET BY MOUTH 2 TIMES DAILY AS NEEDED. TAKE IF SYSTOLIC BLOOD PRESSURE GETS BELOW 100 (Patient taking differently: Take 5 mg by mouth See admin instructions. Take 5 mg daily, may take a second 5 mg dose as needed for low bp) 180 tablet 3 Taking Differently   montelukast (SINGULAIR) 10 MG tablet Take 10 mg by mouth at bedtime.     Past Week   Omega-3 Fatty Acids (FISH OIL) 1000 MG CAPS Take 1,000 mg by mouth in the morning.   Past Week   polyethylene glycol (MIRALAX) 17 g packet Take 17 g by mouth daily. (Patient taking differently: Take 17 g by mouth daily as needed for moderate constipation.) 14 each 0 Taking Differently   PROCTOSOL HC 2.5 % rectal cream APPLY INTO AND AROUND RECTUM 2 TIMES A DAY (Patient taking differently: Place 1 Application rectally 2 (two) times daily as needed for hemorrhoids or anal itching.) 28.35 g 0 Taking Differently   rOPINIRole (REQUIP) 1 MG tablet TAKE 1 TABLET BY MOUTH 3 TIMES DAILY. 270 tablet 0 01/11/2023 at  4:30 AM   rosuvastatin (CRESTOR) 20 MG tablet Take 1 tablet (20 mg total) by mouth at bedtime. 90 tablet 0 01/10/2023 Evening   tamsulosin (FLOMAX) 0.4 MG CAPS capsule Take 0.4 mg by mouth daily after supper.    01/10/2023 Evening   zolpidem (AMBIEN) 10 MG tablet Take 10 mg by mouth at bedtime.   Past Month   [DISCONTINUED] clopidogrel (PLAVIX) 75 MG tablet Take 1 tablet (75 mg total) by mouth daily. 30 tablet 2 01/04/2023   nitroGLYCERIN (NITROSTAT) 0.4 MG  SL tablet Place 1 tablet (0.4 mg total) under the tongue every 5 (five) minutes as needed for chest pain (Do not give more than 3 SL tablets in 15 minutes.). 25 tablet 2 Unknown      Current Facility-Administered Medications:    0.9 %  sodium chloride infusion, , Intravenous, Continuous, Cockerham, Alicia M, PA-C   0.9 % irrigation (POUR BTL), , , PRN, Joen Laura, MD, 1,000 mL at 01/11/23 0948   acetaminophen (OFIRMEV) IV 1,000 mg, 1,000 mg,  Intravenous, Once PRN, Leilani Able, MD   acetaminophen (TYLENOL) tablet 1,000 mg, 1,000 mg, Oral, Once, Cockerham, Alicia M, PA-C   Exparel plus 20cc 0.25% MARCAINE WITH EPI plus 20cc saline, , , Once **AND** bupivacaine liposome (EXPAREL) 1.3 % injection 266 mg, 20 mL, Other, Once, Cockerham, Alicia M, PA-C   bupivacaine liposome (EXPAREL) 1.3 % injection, , , PRN, Joen Laura, MD, 20 mL at 01/11/23 0950   bupivacaine-EPINEPHrine (MARCAINE W/ EPI) 0.25% -1:200000 (with pres) injection, , , PRN, Joen Laura, MD, 30 mL at 01/11/23 0950   HYDROmorphone (DILAUDID) injection 0.25-0.5 mg, 0.25-0.5 mg, Intravenous, Q5 min PRN, Leilani Able, MD   isopropyl alcohol 70 % external solution, , , PRN, Joen Laura, MD, 1 Application at 01/11/23 7829   lactated ringers infusion, , Intravenous, Continuous, Marcene Duos, MD, Last Rate: 10 mL/hr at 01/11/23 0859, Continued from Pre-op at 01/11/23 0859   methocarbamol (ROBAXIN) tablet 500 mg, 500 mg, Oral, Q6H PRN **OR** methocarbamol (ROBAXIN) injection 500 mg, 500 mg, Intravenous, Q6H PRN, Cockerham, Alicia M, PA-C   ondansetron [facility]) injection 4 mg, 4 mg, Intravenous, Once PRN, Hatchett, Susann Givens, MD   povidone-iodine 10 % swab 2 Application, 2 Application, Topical, Once, Cockerham, Alicia M, PA-C   promethazine (PHENERGAN) 12.5 mg in sodium chloride 0.9 % 50 mL IVPB, 12.5 mg, Intravenous, Q15 min PRN, Hatchett, Franklin, MD   sodium chloride flush (NS) 0.9 % injection, , , PRN, Joen Laura, MD, 30 mL at 01/11/23 0950   sodium chloride irrigation 0.9 %, , , PRN, Joen Laura, MD, 1,000 mL at 01/11/23 0949   water for irrigation, sterile for irrigation SOLN, , , PRN, Joen Laura, MD, 1,000 mL at 01/11/23 0949  Vitals   Vitals:   01/11/23 1200 01/11/23 1215 01/11/23 1230 01/11/23 1245  BP: 134/86 124/76 (!) 153/93   Pulse: 75 80 81 83  Resp: 13 14 15 14   Temp:      TempSrc:      SpO2:  100% 100% 98% 97%  Weight:      Height:         Body mass index is 30.21 kg/m.  Physical Exam   Exam performed over telemedicine with 2-way video and audio communication and with assistance of bedside RN  Physical Exam Gen: alert, unable to answer orientation questions Resp: normal WOB CV: extremities appear well-perfused  Neuro: *MS: alert, unable to answer orientation questions, follows simple commands *Speech: severe dysarthria and aphasia *CN: PERRL 3mm, EOMI, blinks to threat on L only, sensation intact, R facial droop, hearing intact to voice *Motor:   Normal bulk.  No tremor, rigidity or bradykinesia. Drift but not to bed on R, all other extremities no drift *Sensory: Impaired to LT on R *Coordination:  FNF intact *Gait: deferred  NIHSS  1a Level of Conscious.: 0 1b LOC Questions: 2 1c LOC Commands: 0 2 Best Gaze: 0 3 Visual: 1 4 Facial Palsy: 1 5a Motor Arm -  left: 0 5b Motor Arm - Right: 1 6a Motor Leg - Left: 0 6b Motor Leg - Right: 0 7 Limb Ataxia: 0 8 Sensory: 1 9 Best Language: 2 10 Dysarthria: 2 11 Extinct. and Inatten.: 0  TOTAL: 10   Premorbid mRS = 2   Labs   CBC: No results for input(s): "WBC", "NEUTROABS", "HGB", "HCT", "MCV", "PLT" in the last 168 hours.  Basic Metabolic Panel:  Lab Results  Component Value Date   NA 136 12/30/2022   K 5.3 (H) 12/30/2022   CO2 25 12/30/2022   GLUCOSE 112 (H) 12/30/2022   BUN 16 12/30/2022   CREATININE 0.84 12/30/2022   CALCIUM 9.2 12/30/2022   GFRNONAA >60 12/30/2022   GFRAA >60 08/25/2015   Lipid Panel:  Lab Results  Component Value Date   LDLCALC 23 05/26/2022   HgbA1c:  Lab Results  Component Value Date   HGBA1C 5.3 05/26/2022   Urine Drug Screen: No results found for: "LABOPIA", "COCAINSCRNUR", "LABBENZ", "AMPHETMU", "THCU", "LABBARB"  Alcohol Level No results found for: "ETH"  CT Head without contrast: No acute process on personal review  CT angio Head and Neck with  contrast: No LVO on personal review   Impression   This is a 77 yo man with hx CAD, carotid disease, HL, hx loop recprder, HTN, MI, Parkinsons, Stroke 2017, and vertigo who developed acute onset R sided weakness and aphasia after R TKA today. Stroke code was activated, NIHSS was 10. CT showed no acute process on personal review. Patient was not eligible for TNK given invasive surgery this AM. CTA H&N showed no LVO therefore patient was not a candidate for intervention. D/w Dr. Blanchie Dessert, patient will be transferred to [facility] for stroke workup.  Recommendations   - Transfer to [facility] for stroke workup - Permissive HTN x48 hrs from sx onset or until stroke ruled out by MRI goal BP <220/120. PRN labetalol or hydralazine if BP above these parameters. Avoid oral antihypertensives. - MRI brain wo contrast - TTE - Check A1c and LDL + add statin per guidelines - Per Dr. Blanchie Dessert OK to start aspirin now and plavix tomorrow - NPO until swallow screen - q4 hr neuro checks - STAT head CT for any change in neuro exam - Tele - PT/OT/SLP - Stroke education - Amb referral to neurology upon discharge   Please notify neurology upon patient transfer to [facility] and stroke team will follow him there  ______________________________________________________________________   Thank you for the opportunity to take part in the care of this patient. If you have any further questions, please contact the neurology consultation attending.  Signed,  Bing Neighbors, MD [facility] [phone]  If 7pm- 7am, please page neurology on call as listed in AMION.  **Any copied and pasted documentation in this note was written by me in another application not billed for and pasted by me into this document.

## 2023-01-11 NOTE — Telephone Encounter (Signed)
Caller stated patient had knee surgery today at [facility] and during surgery patient had a stroke. Caller wanted Dr Tat to be aware and also wanted to know if she can do anything to help them. Caller's wife would like to speak with nurse.

## 2023-01-11 NOTE — Anesthesia Procedure Notes (Signed)
Spinal  Patient location during procedure: OR Start time: 01/11/2023 9:04 AM End time: 01/11/2023 9:07 AM Reason for block: surgical anesthesia Staffing Performed: anesthesiologist  Anesthesiologist: Leilani Able, MD Performed by: Leilani Able, MD Authorized by: Leilani Able, MD   Preanesthetic Checklist Completed: patient identified, IV checked, site marked, risks and benefits discussed, surgical consent, monitors and equipment checked, pre-op evaluation and timeout performed Spinal Block Patient position: sitting Prep: DuraPrep and site prepped and draped Patient monitoring: continuous pulse ox and blood pressure Approach: midline Location: L3-4 Injection technique: single-shot Needle Needle type: Pencan  Needle gauge: 24 G Needle length: 10 cm Needle insertion depth: 7 cm Assessment Sensory level: T8 Events: CSF return

## 2023-01-11 NOTE — Progress Notes (Signed)
Spoke with Bing Neighbors MD, by phone to clarify orders and update on patients current neuro exam.   I confirmed with Dr. Selina Cooley that patient is to get 81 mg Asprin PO if he passes his swallow exam, However, if he does not pass then he is to get the 300mg  Asprin PR suppository.   She would like [place] to continue Q2 neuro exams with [facility] while in PACU and then once pt is on the floor orders will be for Q4 neuro with [facility].  She will be available to be contacted directly until 8pm tonight- after 8pm we are to call the on call neuro-hospitalist at [facility] with questions or concerns.  Patient has decreased reaction to threat in right eye and neglect in the right eye- she confirms this was the same during her assessment. Per MD please call with any neuro changes or if any symptoms or [facility] worsens.

## 2023-01-12 ENCOUNTER — Inpatient Hospital Stay (HOSPITAL_COMMUNITY): Payer: Medicare HMO

## 2023-01-12 ENCOUNTER — Other Ambulatory Visit (HOSPITAL_COMMUNITY): Payer: Medicare HMO

## 2023-01-12 DIAGNOSIS — M1711 Unilateral primary osteoarthritis, right knee: Principal | ICD-10-CM

## 2023-01-12 LAB — COMPREHENSIVE METABOLIC PANEL
ALT: 19 U/L (ref 0–44)
AST: 23 U/L (ref 15–41)
Albumin: 3.2 g/dL — ABNORMAL LOW (ref 3.5–5.0)
Alkaline Phosphatase: 45 U/L (ref 38–126)
Anion gap: 10 (ref 5–15)
BUN: 23 mg/dL (ref 8–23)
CO2: 21 mmol/L — ABNORMAL LOW (ref 22–32)
Calcium: 8.8 mg/dL — ABNORMAL LOW (ref 8.9–10.3)
Chloride: 105 mmol/L (ref 98–111)
Creatinine, Ser: 0.92 mg/dL (ref 0.61–1.24)
GFR, Estimated: >60 mL/min (ref >60)
Glucose, Bld: 157 mg/dL — ABNORMAL HIGH (ref 70–99)
Potassium: 3.9 mmol/L (ref 3.5–5.1)
Sodium: 136 mmol/L (ref 135–145)
Total Bilirubin: 0.8 mg/dL (ref 0.0–1.2)
Total Protein: 6 g/dL — ABNORMAL LOW (ref 6.5–8.1)

## 2023-01-12 LAB — CBC
HCT: 36.8 % — ABNORMAL LOW (ref 39.0–52.0)
Hemoglobin: 12.5 g/dL — ABNORMAL LOW (ref 13.0–17.0)
MCH: 30.7 pg (ref 26.0–34.0)
MCHC: 34 g/dL (ref 30.0–36.0)
MCV: 90.4 fL (ref 80.0–100.0)
Platelets: 218 10*3/uL (ref 150–400)
RBC: 4.07 MIL/uL — ABNORMAL LOW (ref 4.22–5.81)
RDW: 13.3 % (ref 11.5–15.5)
WBC: 11.5 10*3/uL — ABNORMAL HIGH (ref 4.0–10.5)
nRBC: 0 % (ref 0.0–0.2)

## 2023-01-12 LAB — HEMOGLOBIN A1C
Hgb A1c MFr Bld: 5.9 % — ABNORMAL HIGH (ref 4.8–5.6)
Mean Plasma Glucose: 123 mg/dL

## 2023-01-12 MED ORDER — CARBIDOPA-LEVODOPA ER 50-200 MG PO TBCR
1.0000 | EXTENDED_RELEASE_TABLET | Freq: Every day | ORAL | Status: DC
  Administered 2023-01-13 – 2023-01-14 (×2): 1 via ORAL
  Filled 2023-01-12 (×4): qty 1

## 2023-01-12 MED ORDER — HYDROCORTISONE (PERIANAL) 2.5 % EX CREA: 1.0000 | TOPICAL_CREAM | Freq: Two times a day (BID) | CUTANEOUS | Status: DC | PRN

## 2023-01-12 MED ORDER — STROKE: EARLY STAGES OF RECOVERY BOOK
Freq: Once | Status: AC
  Filled 2023-01-12: qty 1

## 2023-01-12 MED ORDER — DIPHENHYDRAMINE HCL 12.5 MG/5ML PO ELIX: 12.5000 mg | ORAL_SOLUTION | ORAL | Status: DC | PRN

## 2023-01-12 MED ORDER — FLUTICASONE PROPIONATE 50 MCG/ACT NA SUSP
1.0000 | Freq: Every day | NASAL | Status: DC
  Administered 2023-01-12 – 2023-01-14 (×3): 1 via NASAL
  Filled 2023-01-12: qty 16

## 2023-01-12 MED ORDER — PHENOL 1.4 % MT LIQD: 1.0000 | OROMUCOSAL | Status: DC | PRN

## 2023-01-12 MED ORDER — POLYETHYLENE GLYCOL 3350 17 G PO PACK: 17.0000 g | PACK | Freq: Every day | ORAL | Status: DC | PRN

## 2023-01-12 MED ORDER — ASPIRIN 81 MG PO CHEW
81.0000 mg | CHEWABLE_TABLET | Freq: Two times a day (BID) | ORAL | Status: DC
  Administered 2023-01-12 (×2): 81 mg via ORAL
  Filled 2023-01-12 (×2): qty 1

## 2023-01-12 MED ORDER — KETOTIFEN FUMARATE 0.035 % OP SOLN
1.0000 [drp] | Freq: Two times a day (BID) | OPHTHALMIC | Status: DC
  Administered 2023-01-12 – 2023-01-15 (×6): 1 [drp] via OPHTHALMIC
  Filled 2023-01-12: qty 5

## 2023-01-12 MED ORDER — ROSUVASTATIN CALCIUM 20 MG PO TABS
20.0000 mg | ORAL_TABLET | Freq: Every day | ORAL | Status: DC
  Administered 2023-01-12 – 2023-01-14 (×3): 20 mg via ORAL
  Filled 2023-01-12 (×3): qty 1

## 2023-01-12 MED ORDER — ROPINIROLE HCL 1 MG PO TABS
1.0000 mg | ORAL_TABLET | Freq: Three times a day (TID) | ORAL | Status: DC
  Administered 2023-01-12 – 2023-01-15 (×9): 1 mg via ORAL
  Filled 2023-01-12 (×9): qty 1

## 2023-01-12 MED ORDER — LORATADINE 10 MG PO TABS
10.0000 mg | ORAL_TABLET | Freq: Every evening | ORAL | Status: DC
  Administered 2023-01-12 – 2023-01-14 (×3): 10 mg via ORAL
  Filled 2023-01-12 (×3): qty 1

## 2023-01-12 MED ORDER — MONTELUKAST SODIUM 10 MG PO TABS
10.0000 mg | ORAL_TABLET | Freq: Every day | ORAL | Status: DC
  Administered 2023-01-12 – 2023-01-14 (×3): 10 mg via ORAL
  Filled 2023-01-12 (×3): qty 1

## 2023-01-12 MED ORDER — FINASTERIDE 5 MG PO TABS
5.0000 mg | ORAL_TABLET | Freq: Every evening | ORAL | Status: DC
  Administered 2023-01-12 – 2023-01-14 (×3): 5 mg via ORAL
  Filled 2023-01-12 (×3): qty 1

## 2023-01-12 MED ORDER — PANTOPRAZOLE SODIUM 40 MG PO TBEC
40.0000 mg | DELAYED_RELEASE_TABLET | Freq: Every day | ORAL | Status: DC
Start: 2023-01-13 — End: 2023-01-15
  Administered 2023-01-13 – 2023-01-15 (×3): 40 mg via ORAL
  Filled 2023-01-12 (×3): qty 1

## 2023-01-12 MED ORDER — AZELASTINE-FLUTICASONE 137-50 MCG/ACT NA SUSP: 1.0000 | Freq: Every day | NASAL | Status: DC

## 2023-01-12 MED ORDER — METOPROLOL SUCCINATE ER 25 MG PO TB24
12.5000 mg | ORAL_TABLET | Freq: Every day | ORAL | Status: DC
  Administered 2023-01-12 – 2023-01-14 (×3): 12.5 mg via ORAL
  Filled 2023-01-12 (×3): qty 1

## 2023-01-12 MED ORDER — MIDODRINE HCL 5 MG PO TABS
5.0000 mg | ORAL_TABLET | Freq: Two times a day (BID) | ORAL | Status: DC | PRN
Start: 2023-01-12 — End: 2023-01-15

## 2023-01-12 MED ORDER — MEXILETINE HCL 150 MG PO CAPS
150.0000 mg | ORAL_CAPSULE | Freq: Two times a day (BID) | ORAL | Status: DC
  Administered 2023-01-12 – 2023-01-15 (×6): 150 mg via ORAL
  Filled 2023-01-12 (×7): qty 1

## 2023-01-12 MED ORDER — ALBUTEROL SULFATE (2.5 MG/3ML) 0.083% IN NEBU: 2.5000 mg | INHALATION_SOLUTION | RESPIRATORY_TRACT | Status: DC | PRN

## 2023-01-12 MED ORDER — ESCITALOPRAM OXALATE 10 MG PO TABS
20.0000 mg | ORAL_TABLET | Freq: Every day | ORAL | Status: DC
  Administered 2023-01-12 – 2023-01-15 (×4): 20 mg via ORAL
  Filled 2023-01-12 (×4): qty 2

## 2023-01-12 MED ORDER — NITROGLYCERIN 0.4 MG SL SUBL: 0.4000 mg | SUBLINGUAL_TABLET | SUBLINGUAL | Status: DC | PRN

## 2023-01-12 MED ORDER — ASPIRIN 81 MG PO CHEW: 81.0000 mg | CHEWABLE_TABLET | Freq: Two times a day (BID) | ORAL | Status: DC

## 2023-01-12 MED ORDER — DOCUSATE SODIUM 100 MG PO CAPS
100.0000 mg | ORAL_CAPSULE | Freq: Two times a day (BID) | ORAL | Status: DC
Start: 2023-01-12 — End: 2023-01-15
  Administered 2023-01-13 – 2023-01-15 (×5): 100 mg via ORAL
  Filled 2023-01-12 (×5): qty 1

## 2023-01-12 MED ORDER — ZOLPIDEM TARTRATE 5 MG PO TABS
10.0000 mg | ORAL_TABLET | Freq: Every day | ORAL | Status: DC
Start: 2023-01-12 — End: 2023-01-15
  Administered 2023-01-12 – 2023-01-14 (×3): 10 mg via ORAL
  Filled 2023-01-12 (×3): qty 2

## 2023-01-12 MED ORDER — AZELASTINE HCL 0.1 % NA SOLN
1.0000 | Freq: Every day | NASAL | Status: DC
  Administered 2023-01-12 – 2023-01-14 (×3): 1 via NASAL
  Filled 2023-01-12: qty 30

## 2023-01-12 MED ORDER — TAMSULOSIN HCL 0.4 MG PO CAPS
0.4000 mg | ORAL_CAPSULE | Freq: Every day | ORAL | Status: DC
  Administered 2023-01-12 – 2023-01-14 (×3): 0.4 mg via ORAL
  Filled 2023-01-12 (×3): qty 1

## 2023-01-12 MED ORDER — LACTATED RINGERS IV SOLN: INTRAVENOUS | Status: AC

## 2023-01-12 MED ORDER — CARBIDOPA-LEVODOPA 25-100 MG PO TABS
2.0000 | ORAL_TABLET | Freq: Every day | ORAL | Status: DC
  Administered 2023-01-12 – 2023-01-15 (×14): 2 via ORAL
  Filled 2023-01-12 (×13): qty 2

## 2023-01-12 MED ORDER — MENTHOL 3 MG MT LOZG: 1.0000 | LOZENGE | OROMUCOSAL | Status: DC | PRN

## 2023-01-12 NOTE — Progress Notes (Signed)
 Inpatient Rehab Coordinator Note:  I met with patient and his family (spouse and 2 dtrs) at bedside to discuss CIR recommendations and goals/expectations of CIR stay.  We reviewed 3 hrs/day of therapy, physician follow up, and average length of stay 2 weeks (dependent upon progress) with goals of supervision.  We reviewed insurance auth process and I will start that today.    Reche Lowers, PT, DPT Admissions Coordinator [phone] 01/12/23  3:46 PM

## 2023-01-12 NOTE — TOC Initial Note (Signed)
 Transition of Care [facility]) - Initial/Assessment Note    Patient Details  Name: Joshua [name] MRN: [id number] Date of Birth: [birth year]  [facility] CM/SW Contact:    Andrez JULIANNA George, RN Phone Number: 01/12/2023, 3:49 PM  Clinical Narrative:                  Pt is from home with his spouse. He has needed supervision after a hospital stay.  DME at home: cane/ walker/ BSC/ shower seat Pt was managing his own medications at home but spouse can over see as needed.  Pt was driving but spouse will provide needed transportation. CIR to start insurance for a rehab admission. TOC following.  Expected Discharge Plan: IP Rehab Facility Barriers to Discharge: Continued Medical Work up   Patient Goals and CMS Choice   CMS Medicare.gov Compare Post Acute Care list provided to:: Patient Choice offered to / list presented to : Patient, Spouse      Expected Discharge Plan and Services   Discharge Planning Services: CM Consult Post Acute Care Choice: IP Rehab Living arrangements for the past 2 months: Single Family Home                                      Prior Living Arrangements/Services Living arrangements for the past 2 months: Single Family Home Lives with:: Spouse Patient language and need for interpreter reviewed:: Yes Do you feel safe going back to the place where you live?: Yes        Care giver support system in place?: Yes (comment)   Criminal Activity/Legal Involvement Pertinent to Current Situation/Hospitalization: No - Comment as needed  Activities of Daily Living      Permission Sought/Granted                  Emotional Assessment Appearance:: Appears stated age Attitude/Demeanor/Rapport:  (aphasia) Affect (typically observed): Accepting Orientation: : Oriented to Self, Oriented to Place   Psych Involvement: No (comment)  Admission diagnosis:  Primary osteoarthritis of right knee [M17.11] Localized osteoarthritis of right  knee [M17.11] Patient Active Problem List   Diagnosis Date Noted   Primary osteoarthritis of right knee 01/11/2023   Localized osteoarthritis of right knee 01/11/2023   Primary osteoarthritis of left knee 09/28/2022   SBO (small bowel obstruction) (HCC) 05/29/2022   Chest pain 05/26/2022   Epigastric abdominal pain 05/26/2022   Coronary artery disease involving native coronary artery of native heart without angina pectoris 05/28/2021   History of myocardial infarction 02/05/2021   Parkinson's disease (HCC)    Malignant neoplasm of prostate (HCC) 11/01/2017   Essential tremor 12/06/2015   PVC (premature ventricular contraction) 09/26/2015   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) 04/07/2015   Acute CVA (cerebrovascular accident) (HCC) 03/06/2015   Stroke (cerebrum) (HCC) 03/05/2015   Cerebral infarction (HCC) 03/05/2015   Weakness of right upper extremity    Tremor    Ejection fraction    Vertigo    Sleep apnea    Palpitations    Hyperlipidemia with target LDL less than 70    GERD (gastroesophageal reflux disease)    HTN (hypertension)    Chest pain    RBBB (right bundle branch block)    Ejection fraction    Carotid artery disease (HCC)    PCP:  Loreli Elsie JONETTA Mickey., MD Pharmacy:   [id number] - [place], [place] -  [address] [place] [address] Phone: [phone] Fax: [phone]  [facility] [address] [place] [address] Phone: [phone] Fax: [phone]  [facility] [address] [place] [address] Phone: [phone] Fax: [phone]     Social Drivers of Health (SDOH) Social History: SDOH Screenings   Food Insecurity: Patient Unable To Answer (01/11/2023)  Housing: Patient Unable To Answer (01/11/2023)  Transportation Needs: Patient Unable To Answer (01/11/2023)  Utilities: Not At Risk (09/28/2022)  Depression  (PHQ2-9): Low Risk  (04/15/2021)  Social Connections: Unknown (01/11/2023)  Tobacco Use: Low Risk  (01/11/2023)   SDOH Interventions:     Readmission Risk Interventions     No data to display

## 2023-01-12 NOTE — Plan of Care (Signed)

## 2023-01-12 NOTE — Evaluation (Signed)
 Physical Therapy Evaluation Patient Details Name: Joshua [name] MRN: [id number] DOB: 08/24/1945 Today's Date: 01/12/2023  History of Present Illness  77 yo man who developed acute onset R sided weakness and aphasia after R TKA 01/11/23. NIHSS 10; aphasia; MRI Acute infarct of the left precentral gyrus and middle frontal gyrus  with petechial hemorrhage at the infarct site  PMH  CAD, carotid disease, HL, hx loop recprder, HTN, MI, Parkinsons, Stroke 2017, and vertigo  Clinical Impression   Pt admitted secondary to problem above with deficits below. PTA patient was independent with all mobility. He lives with spouse in one level home with no steps to enter.  Pt currently requires overall min assist for mobility with difficulty keeping RUE on RW due to decr grip strength.  Anticipate patient will benefit from PT to address problems listed below.Will continue to follow acutely to maximize functional mobility independence and safety.  Patient appropriate for post-acute inpt rehab >3 hours therapy per day.          If plan is discharge home, recommend the following: A little help with walking and/or transfers;Assist for transportation;Help with stairs or ramp for entrance   Can travel by private vehicle        Equipment Recommendations Rolling walker (2 wheels) (with Rt walker splint)  Recommendations for Other Services  Rehab consult    Functional Status Assessment Patient has had a recent decline in their functional status and demonstrates the ability to make significant improvements in function in a reasonable and predictable amount of time.     Precautions / Restrictions Precautions Precautions: Knee;Fall Precaution Booklet Issued: No Precaution Comments: Educated on keeping knee straight Restrictions Weight Bearing Restrictions Per Provider Order: Yes RLE Weight Bearing Per Provider Order: Weight bearing as tolerated      Mobility  Bed Mobility Overal bed mobility:  Needs Assistance Bed Mobility: Supine to Sit     Supine to sit: Contact guard, HOB elevated     General bed mobility comments: incr effort and time to exit bed to left    Transfers Overall transfer level: Needs assistance Equipment used: Rolling walker (2 wheels) Transfers: Sit to/from Stand Sit to Stand: Min assist           General transfer comment: required repeated attempts and use of momentum from bed at lowest height    Ambulation/Gait Ambulation/Gait assistance: Min assist Gait Distance (Feet): 40 Feet Assistive device: Rolling walker (2 wheels) Gait Pattern/deviations: Step-to pattern, Decreased stride length   Gait velocity interpretation: 1.31 - 2.62 ft/sec, indicative of limited community ambulator   General Gait Details: assist to hold Rt hand on RW and to steer  Stairs            Wheelchair Mobility     Tilt Bed    Modified Rankin (Stroke Patients Only) Modified Rankin (Stroke Patients Only) Pre-Morbid Rankin Score: No significant disability Modified Rankin: Moderately severe disability     Balance Overall balance assessment: Needs assistance Sitting-balance support: Single extremity supported, Feet supported Sitting balance-Leahy Scale: Poor Sitting balance - Comments: slight rt lean; in recliner propped pillow under RUE for edema management and keeping pt midline Postural control: Right lateral lean Standing balance support: Bilateral upper extremity supported Standing balance-Leahy Scale: Poor                               Pertinent Vitals/Pain Pain Assessment Pain Assessment: Faces Faces Pain Scale: Hurts a  little bit Pain Location: Rt kne Pain Descriptors / Indicators: Operative site guarding Pain Intervention(s): Limited activity within patient's tolerance, Monitored during session, Repositioned    Home Living Family/patient expects to be discharged to:: Private residence Living Arrangements: Spouse/significant  other Available Help at Discharge: Family;Available 24 hours/day Type of Home: House Home Access: Level entry       Home Layout: One level Home Equipment: Agricultural Consultant (2 wheels);Shower seat;Grab bars - tub/shower      Prior Function Prior Level of Function : Independent/Modified Independent (not driving)                     Extremity/Trunk Assessment   Upper Extremity Assessment Upper Extremity Assessment: Defer to OT evaluation (difficulty gripping walker with Rt hand)    Lower Extremity Assessment Lower Extremity Assessment: RLE deficits/detail RLE Deficits / Details: post-op TKA; able to flex to 90 in sitting; able to SLR    Cervical / Trunk Assessment Cervical / Trunk Assessment: Normal  Communication   Communication Communication: Difficulty communicating thoughts/reduced clarity of speech Cueing Techniques: Verbal cues;Gestural cues  Cognition Arousal: Alert Behavior During Therapy: WFL for tasks assessed/performed Overall Cognitive Status: Difficult to assess                                 General Comments: aphasia; can state name, not DOB; yes/no reliable        General Comments General comments (skin integrity, edema, etc.): HR 82-102 with activity    Exercises Total Joint Exercises Ankle Circles/Pumps: AROM, Both, 10 reps Heel Slides: AROM, Right, 5 reps Straight Leg Raises: AROM, Right, 5 reps Long Arc Quad: AROM, Right, 10 reps Knee Flexion: AROM, Right, 10 reps, Seated Goniometric ROM: ~90 (no goniometer available)   Assessment/Plan    PT Assessment Patient needs continued PT services  PT Problem List Decreased strength;Decreased range of motion;Decreased balance;Decreased mobility;Decreased knowledge of use of DME;Impaired tone;Pain       PT Treatment Interventions DME instruction;Gait training;Stair training;Functional mobility training;Therapeutic activities;Therapeutic exercise;Balance training;Neuromuscular  re-education;Patient/family education    PT Goals (Current goals can be found in the Care Plan section)  Acute Rehab PT Goals Patient Stated Goal: unable to state; aphasia PT Goal Formulation: With patient Time For Goal Achievement: 01/26/23 Potential to Achieve Goals: Good    Frequency 7X/week     Co-evaluation               AM-PAC PT 6 Clicks Mobility  Outcome Measure Help needed turning from your back to your side while in a flat bed without using bedrails?: None Help needed moving from lying on your back to sitting on the side of a flat bed without using bedrails?: A Little Help needed moving to and from a bed to a chair (including a wheelchair)?: A Little Help needed standing up from a chair using your arms (e.g., wheelchair or bedside chair)?: A Little Help needed to walk in hospital room?: A Little Help needed climbing 3-5 steps with a railing? : Total 6 Click Score: 17    End of Session Equipment Utilized During Treatment: Gait belt Activity Tolerance: Patient tolerated treatment well Patient left: in chair;with call bell/phone within reach;with chair alarm set Nurse Communication: Mobility status PT Visit Diagnosis: Other abnormalities of gait and mobility (R26.89);Hemiplegia and hemiparesis Hemiplegia - Right/Left: Right Hemiplegia - dominant/non-dominant: Dominant Hemiplegia - caused by: Cerebral infarction    Time: 9085-9062 PT Time  Calculation (min) (ACUTE ONLY): 23 min   Charges:   PT Evaluation $PT Eval Low Complexity: 1 Low PT Treatments $Gait Training: 8-22 mins PT General Charges $$ ACUTE PT VISIT: 1 Visit          Joshua RAMAN, PT Acute Rehabilitation Services  Office [phone]   Joshua [name] 01/12/2023, 9:55 AM

## 2023-01-12 NOTE — Evaluation (Signed)
 Speech Language Pathology Evaluation Patient Details Name: Joshua [name] MRN: [id number] DOB: [birth year] Today's Date: 01/12/2023 Time: 8881-8859 SLP Time Calculation (min) (ACUTE ONLY): 22 min  Problem List:  Patient Active Problem List   Diagnosis Date Noted   Primary osteoarthritis of right knee 01/11/2023   Localized osteoarthritis of right knee 01/11/2023   Primary osteoarthritis of left knee 09/28/2022   SBO (small bowel obstruction) (HCC) 05/29/2022   Chest pain 05/26/2022   Epigastric abdominal pain 05/26/2022   Coronary artery disease involving native coronary artery of native heart without angina pectoris 05/28/2021   History of myocardial infarction 02/05/2021   Parkinson's disease (HCC)    Malignant neoplasm of prostate (HCC) 11/01/2017   Essential tremor 12/06/2015   PVC (premature ventricular contraction) 09/26/2015   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) 04/07/2015   Acute CVA (cerebrovascular accident) (HCC) 03/06/2015   Stroke (cerebrum) (HCC) 03/05/2015   Cerebral infarction (HCC) 03/05/2015   Weakness of right upper extremity    Tremor    Ejection fraction    Vertigo    Sleep apnea    Palpitations    Hyperlipidemia with target LDL less than 70    GERD (gastroesophageal reflux disease)    HTN (hypertension)    Chest pain    RBBB (right bundle branch block)    Ejection fraction    Carotid artery disease (HCC)    Past Medical History:  Past Medical History:  Diagnosis Date   Allergic rhinitis    Anxiety    from chronic pain from surgery- on Cymbalta    Arthritis    Asthma    Barrett's esophagus 03/29/2014   Carotid artery disease (HCC)    Carotid Doppler normal August, 2007   Coronary artery disease    Diverticulosis    Dyslipidemia    GERD (gastroesophageal reflux disease)    History of loop recorder    has since 04/04/15   HTN (hypertension)    takes Metoprolol  for PVC control   Hx of colonic polyps     adenomatous   IFG (impaired fasting glucose)    Myocardial infarction (HCC)    mild   Neuromuscular disorder (HCC)    parkinson's tremors in hands   Palpitations    Benign PVCs   Parkinson disease (HCC)    Pneumonia    Prostate cancer (HCC)    RBBB (right bundle branch block)    rate related   Shingles    Stroke (HCC) 2017   TIA (transient ischemic attack) 02/2015   Per pt, had 2 strokes   Vertigo    Past Surgical History:  Past Surgical History:  Procedure Laterality Date   BACK SURGERY  2002,2009   x 6   CHOLECYSTECTOMY  11/30/2012   with IOC   COLONOSCOPY     ELECTROPHYSIOLOGIC STUDY N/A 09/26/2015   Procedure: V Tach Ablation (PVC);  Surgeon: Will Gladis Norton, MD;  Location: [facility];  Service: Cardiovascular;  Laterality: N/A;   EP IMPLANTABLE DEVICE N/A 04/04/2015   Procedure: Loop Recorder Insertion;  Surgeon: Will Gladis Norton, MD;  Location: [facility];  Service: Cardiovascular;  Laterality: N/A;   HERNIA REPAIR     laprascopic   KNEE SURGERY     DECEMBER 2017,LEFT KNEE SCOPED   LEFT HEART CATH AND CORONARY ANGIOGRAPHY N/A 02/05/2021   Procedure: LEFT HEART CATH AND CORONARY ANGIOGRAPHY;  Surgeon: Darron Deatrice LABOR, MD;  Location: [facility];  Service: Cardiovascular;  Laterality:  N/A;   NECK SURGERY  2002   POLYPECTOMY     ROTATOR CUFF REPAIR Left    TEE WITHOUT CARDIOVERSION N/A 04/04/2015   Procedure: TRANSESOPHAGEAL ECHOCARDIOGRAM (TEE);  Surgeon: Redell GORMAN Shallow, MD;  Location: [facility];  Service: Cardiovascular;  Laterality: N/A;   TOTAL KNEE ARTHROPLASTY Left 09/28/2022   Procedure: TOTAL KNEE ARTHROPLASTY;  Surgeon: Edna Toribio LABOR, MD;  Location: [facility];  Service: Orthopedics;  Laterality: Left;   TRIGGER FINGER RELEASE Left 12/28/2019   Procedure: RELEASE TRIGGER FINGER/A-1 PULLEY THUMB, MIDDLE AND RING;  Surgeon: Murrell Kuba, MD;  Location: [facility];  Service: Orthopedics;  Laterality: Left;   FAB   UPPER GASTROINTESTINAL ENDOSCOPY     V Tach ablation  09/26/2015   HPI:  77 yo man who developed acute onset R sided weakness and aphasia after R TKA 01/11/23. NIHSS 10; aphasia; MRI Acute infarct of the left precentral gyrus and middle frontal gyrus  with petechial hemorrhage at the infarct site  PMH  CAD, carotid disease, HL, hx loop recprder, HTN, MI, Parkinsons, Stroke 2017, and vertigo   Assessment / Plan / Recommendation Clinical Impression  Pt presents with mixed aphasia, comprehension for basic information greater than verbal output. His speech is dysfluent, mostly expresses himself in phrases marked by false starts, hesitations, some semantic but mostly phonemic paraphasias. He has success with phrase completion cues and several semantic cues and is aware of errors more than half the time. Given parts of the bedside WAB he received 8/10 on verbal auditory comprehension, 1/10 on sequential commands, object naming 5/9, apraxia 8/10. He exhibited significant right inattention and could not see items on tray that were near midline.Did not assess writing due to right had weakness and will assess reading comprehension in treatemnt. Overall his cognition was slightly decreased from baseline in the area of awareness, attention and can be further evaluated in therapy. Pt's wife arrrived during evaluation and SLP updated her re: aphasia and provided education/examples of ways to cue pt for target words and comprehension. Recommend continue ST in acute care and on inpatient rehab setting > 3 hours.    SLP Assessment  SLP Recommendation/Assessment: Patient needs continued Speech Lanaguage Pathology Services SLP Visit Diagnosis: Aphasia (R47.01)    Recommendations for follow up therapy are one component of a multi-disciplinary discharge planning process, led by the attending physician.  Recommendations may be updated based on patient status, additional functional criteria and insurance  authorization.    Follow Up Recommendations  Acute inpatient rehab (3hours/day)    Assistance Recommended at Discharge  Frequent or constant Supervision/Assistance  Functional Status Assessment Patient has had a recent decline in their functional status and demonstrates the ability to make significant improvements in function in a reasonable and predictable amount of time.  Frequency and Duration min 2x/week  2 weeks      SLP Evaluation Cognition  Overall Cognitive Status: Difficult to assess Arousal/Alertness: Awake/alert Orientation Level: Oriented to person (need to inquire re place, situation) Attention: Sustained Sustained Attention: Appears intact Memory:  (to be assessed) Awareness: Impaired Awareness Impairment: Emergent impairment Problem Solving:  (will assess) Safety/Judgment: Impaired       Comprehension  Auditory Comprehension Overall Auditory Comprehension: Impaired Yes/No Questions: Impaired Complex Questions:  (more abstract ?'s on bedside WAB) Commands: Impaired Two Step Basic Commands: 0-24% accurate Multistep Basic Commands: 0-24% accurate Conversation: Simple Visual Recognition/Discrimination Discrimination: Not tested Reading Comprehension Reading Status:  (TBA)    Expression Expression Primary Mode of Expression:  Verbal Verbal Expression Overall Verbal Expression: Impaired Initiation: No impairment Level of Generative/Spontaneous Verbalization: Phrase Repetition: Impaired (impaired only for longer sentence) Level of Impairment:  (impaired only for longer sentence) Naming: Impairment Confrontation: Impaired (55%) Verbal Errors: Phonemic paraphasias;Semantic paraphasias (aware of errors most of the time) Pragmatics: No impairment Written Expression Dominant Hand: Right Written Expression: Not tested (due to right hand paresis)   Oral / Motor  Oral Motor/Sensory Function Overall Oral Motor/Sensory Function: Mild impairment Facial ROM:  Reduced left;Suspected CN VII (facial) dysfunction (appeared to have slightly decreased ROM on left although stroke affected his right side) Facial Symmetry: Abnormal symmetry left;Suspected CN VII (facial) dysfunction (appeared to have decreased symmetry on left although he had a left CVA) Lingual ROM: Within Functional Limits Lingual Symmetry: Within Functional Limits Motor Speech Overall Motor Speech: Appears within functional limits for tasks assessed Respiration: Within functional limits Phonation: Normal Resonance: Within functional limits Articulation: Within functional limitis Intelligibility: Intelligible Motor Planning: Impaired Level of Impairment: Sentence Motor Speech Errors: Inconsistent            Dustin Olam Bull 01/12/2023, 12:28 PM

## 2023-01-12 NOTE — Progress Notes (Signed)
     Subjective:  Patient reports pain as mild.  Answering questions appropriate this morning.  Does have improved grip in the right upper extremity compared to yesterday after surgery.  Denies distal numbness tingling lower extremities.  Objective:   VITALS:   Vitals:   01/11/23 2045 01/11/23 2205 01/12/23 0041 01/12/23 0500  BP: (!) 142/89 (!) 134/95 123/87 135/89  Pulse: (!) 105 (!) 104 (!) 102 97  Resp: 11 12 12 14   Temp: 98.2 F (36.8 C) 97.9 F (36.6 C) (!) 97.5 F (36.4 C) 97.7 F (36.5 C)  TempSrc:  Oral Axillary Oral  SpO2: 96% 95% 92% 91%  Weight:      Height:        Sensation intact distally Intact pulses distally Dorsiflexion/Plantar flexion intact Incision: dressing C/D/I Compartment soft    Lab Results  Component Value Date   WBC 6.0 12/30/2022   HGB 14.1 12/30/2022   HCT 44.7 12/30/2022   MCV 97.2 12/30/2022   PLT 242 12/30/2022   BMET    Component Value Date/Time   NA 136 12/30/2022 1320   NA 136 02/14/2021 1528   K 5.3 (H) 12/30/2022 1320   CL 104 12/30/2022 1320   CO2 25 12/30/2022 1320   GLUCOSE 112 (H) 12/30/2022 1320   BUN 16 12/30/2022 1320   BUN 20 02/14/2021 1528   CREATININE 0.84 12/30/2022 1320   CREATININE 0.88 09/18/2015 1135   CALCIUM  9.2 12/30/2022 1320   EGFR 73 02/14/2021 1528   GFRNONAA >60 12/30/2022 1320      Xray: Total knee arthroplasty components in good position no adverse features  Assessment/Plan: 1 Day Post-Op   Principal Problem:   Primary osteoarthritis of right knee Active Problems:   Localized osteoarthritis of right knee  S/p R TKA 12/30 post op course complicated by stroke  Neurology following, recs appreciated Diet pending speech eval  Post op recs: WB: WBAT Abx: ancef  Imaging: PACU xrays DVT prophylaxis: Aspirin  81mg  BID x4 weeks, resume plavix  POD1 Follow up: 2 weeks after surgery for a wound check with Dr. Edna at [facility].  Address: [address],  [place], [place] [address]  Office Phone: [phone]   TORIBIO DELENA EDNA 01/12/2023, 7:11 AM   Toribio Edna, MD  Contact information:   [phone] 7am-5pm epic message Dr. Edna, or call office for patient follow up: [phone] After hours and holidays please check [email or web address] for group call information for [facility]

## 2023-01-12 NOTE — Consult Note (Addendum)
 STROKE TEAM CONSULT NOTE   BRIEF HPI [name] is a 77 y.o. male with past medical history significant for CAD, hyperlipidemia, hypertension, MI, Parkinson's, stroke 2017, vertigo who developed acute onset of right-sided weakness, aphasia, garbled speech after right TKA procedure 12/30 at [facility].  His last known well was prior to anesthesia at 859 that morning.  He woke up from anesthesia and was lethargic within his speech became more garbled.  Code stroke was activated presenting with garbled speech after waking up from anesthesia s/p R TKA..  Code stroke was activated.  CT negative.   Patient was not eligible for TNK given surgery that morning.  CTA showed no LVO. NIH on code stroke 10  INTERIM HISTORY/SUBJECTIVE Patient was transferred to [facility] for stroke workup.  He appears quite frustrated due to his expressive aphasia  On exam patient is sitting up in chair.  He has mild right hemiparesis, delayed and garbled speech with expressive aphasia, decreased right blink to threat, decreased right hand movement and control, decreased right hand grip strength.  He is oriented and able to give good history.    He has been evaluated by rehab as a potential candidate on discharge.   OBJECTIVE  CBC    Component Value Date/Time   WBC 6.0 12/30/2022 1320   RBC 4.60 12/30/2022 1320   HGB 14.1 12/30/2022 1320   HGB 15.3 02/14/2021 1528   HCT 44.7 12/30/2022 1320   HCT 44.3 02/14/2021 1528   PLT 242 12/30/2022 1320   PLT 262 02/14/2021 1528   MCV 97.2 12/30/2022 1320   MCV 92 02/14/2021 1528   MCH 30.7 12/30/2022 1320   MCHC 31.5 12/30/2022 1320   RDW 13.4 12/30/2022 1320   RDW 12.7 02/14/2021 1528   LYMPHSABS 1.6 12/30/2022 1320   MONOABS 0.6 12/30/2022 1320   EOSABS 0.2 12/30/2022 1320   BASOSABS 0.1 12/30/2022 1320    BMET    Component Value Date/Time   NA 136 12/30/2022 1320   NA 136 02/14/2021 1528   K 5.3 (H) 12/30/2022 1320   CL 104 12/30/2022 1320    CO2 25 12/30/2022 1320   GLUCOSE 112 (H) 12/30/2022 1320   BUN 16 12/30/2022 1320   BUN 20 02/14/2021 1528   CREATININE 0.84 12/30/2022 1320   CREATININE 0.88 09/18/2015 1135   CALCIUM  9.2 12/30/2022 1320   EGFR 73 02/14/2021 1528   GFRNONAA >60 12/30/2022 1320    IMAGING past 24 hours MR BRAIN WO CONTRAST Result Date: 01/12/2023 CLINICAL DATA:  Acute neurologic deficit EXAM: MRI HEAD WITHOUT CONTRAST TECHNIQUE: Multiplanar, multiecho pulse sequences of the brain and surrounding structures were obtained without intravenous contrast. COMPARISON:  01/11/2023 CT and 04/20/2018 MRI FINDINGS: Brain: Abnormal diffusion restriction of the left precentral gyrus and affecting the middle frontal gyrus. There is petechial hemorrhage at the infarct site. There is multifocal hyperintense T2-weighted signal within the white matter. Generalized volume loss. The midline structures are normal. Vascular: Normal flow voids. Skull and upper cervical spine: Normal calvarium and skull base. Visualized upper cervical spine and soft tissues are normal. Sinuses/Orbits:No paranasal sinus fluid levels or advanced mucosal thickening. No mastoid or middle ear effusion. Normal orbits. IMPRESSION: Acute infarct of the left precentral gyrus and middle frontal gyrus with petechial hemorrhage at the infarct site. No mass effect or midline shift. Electronically Signed   By: Franky Stanford M.D.   On: 01/12/2023 03:57   CT ANGIO HEAD NECK W WO CM (CODE STROKE) Result  Date: 01/11/2023 CLINICAL DATA:  Neuro deficit, acute, stroke suspected. Right-sided weakness. Aphasia. EXAM: CT ANGIOGRAPHY HEAD AND NECK WITH AND WITHOUT CONTRAST TECHNIQUE: Multidetector CT imaging of the head and neck was performed using the standard protocol during bolus administration of intravenous contrast. Multiplanar CT image reconstructions and MIPs were obtained to evaluate the vascular anatomy. Carotid stenosis measurements (when applicable) are obtained  utilizing NASCET criteria, using the distal internal carotid diameter as the denominator. RADIATION DOSE REDUCTION: This exam was performed according to the departmental dose-optimization program which includes automated exposure control, adjustment of the mA and/or kV according to patient size and/or use of iterative reconstruction technique. CONTRAST:  75mL OMNIPAQUE  IOHEXOL  350 MG/ML SOLN COMPARISON:  Head MRA 03/05/2015 FINDINGS: CTA NECK FINDINGS Aortic arch: Normal variant aortic arch branching pattern with common origin of the brachiocephalic and left common carotid arteries. Mild atherosclerosis without significant stenosis of the arch vessel origins. Right carotid system: Patent with a moderate amount of predominantly calcified plaque at the carotid bifurcation and in the proximal ICA. No evidence of a significant stenosis or dissection. Left carotid system: Patent with a small to moderate amount of predominantly calcified plaque at the carotid bifurcation and in the proximal ICA. No evidence of a significant stenosis or dissection. Vertebral arteries: Patent with the right being mildly dominant. Mild-to-moderate stenosis of the left vertebral artery origin. Skeleton: Solid C5-6 ACDF. Asymmetrically advanced left facet arthrosis in the upper cervical spine. Other neck: No evidence of cervical lymphadenopathy or mass. Upper chest: No mass or consolidation in the included lung apices. Review of the MIP images confirms the above findings CTA HEAD FINDINGS Anterior circulation: The internal carotid arteries are patent from skull base to carotid termini with mild atherosclerotic calcification bilaterally not resulting in a significant stenosis. ACAs and MCAs are patent without evidence of a proximal branch occlusion or significant proximal stenosis. No aneurysm is identified. Posterior circulation: The intracranial vertebral arteries are patent to the basilar with mild atherosclerotic irregularity but no  significant stenosis. Patent PICA and SCA origins are visualized bilaterally. The basilar artery is widely patent. Both PCAs are patent without evidence of a significant proximal stenosis. No aneurysm is identified. Venous sinuses: As permitted by contrast timing, patent. Anatomic variants: None. Review of the MIP images confirms the above findings These results were communicated to Dr. Matthews at 1:37 pm on 01/11/2023 by text page via the [facility] messaging system. IMPRESSION: 1. No large vessel occlusion. 2. Mild-to-moderate left vertebral artery origin stenosis. 3. Mild to moderate cervical carotid atherosclerosis without a significant stenosis. 4.  Aortic Atherosclerosis (ICD10-I70.0). Electronically Signed   By: Dasie Hamburg M.D.   On: 01/11/2023 13:43   DG Knee Right Port Result Date: 01/11/2023 CLINICAL DATA:  Postop. EXAM: PORTABLE RIGHT KNEE - 1-2 VIEW COMPARISON:  None Available. FINDINGS: Right knee arthroplasty in expected alignment. No periprosthetic lucency or fracture. There has been patellar resurfacing. Recent postsurgical change includes air and edema in the soft tissues and joint space. IMPRESSION: Right knee arthroplasty without immediate postoperative complication. Electronically Signed   By: Andrea Gasman M.D.   On: 01/11/2023 13:37   CT HEAD CODE STROKE WO CONTRAST Result Date: 01/11/2023 CLINICAL DATA:  Code stroke.  Neuro deficit, acute, stroke suspected EXAM: CT HEAD WITHOUT CONTRAST TECHNIQUE: Contiguous axial images were obtained from the base of the skull through the vertex without intravenous contrast. RADIATION DOSE REDUCTION: This exam was performed according to the departmental dose-optimization program which includes automated exposure control, adjustment of the  mA and/or kV according to patient size and/or use of iterative reconstruction technique. COMPARISON:  None Available. FINDINGS: Brain: No evidence of acute infarction, hemorrhage, hydrocephalus, extra-axial collection  or mass lesion/mass effect. Patchy white matter hypodensities are nonspecific but compatible with chronic microvascular ischemic change. Vascular: No hyperdense vessel identified. Skull: No acute fracture. Sinuses/Orbits: Clear sinuses.  No acute orbital findings. Other: No mastoid effusions. ASPECTS [facility] Stroke Program Early CT Score) Total score (0-10 with 10 being normal): 10. IMPRESSION: 1. No evidence of acute intracranial abnormality. ASPECTS is 10. 2. Chronic microvascular ischemic change. Code stroke imaging results were communicated on 01/11/2023 at 1:19 pm to provider Matthews via telephone, who verbally acknowledged these results. Electronically Signed   By: Gilmore GORMAN Molt M.D.   On: 01/11/2023 13:20    Vitals:   01/11/23 2045 01/11/23 2205 01/12/23 0041 01/12/23 0500  BP: (!) 142/89 (!) 134/95 123/87 135/89  Pulse: (!) 105 (!) 104 (!) 102 97  Resp: 11 12 12 14   Temp: 98.2 F (36.8 C) 97.9 F (36.6 C) (!) 97.5 F (36.4 C) 97.7 F (36.5 C)  TempSrc:  Oral Axillary Oral  SpO2: 96% 95% 92% 91%  Weight:      Height:         PHYSICAL EXAM General:  Alert, well-nourished, well-developed elderly Caucasian male in no acute distress Psych:  Mood and affect appropriate for situation CV: Regular rate and rhythm on monitor Respiratory:  Regular, unlabored respirations on room air GI: Abdomen soft and nontender   NEURO:  Mental Status: AA&Ox3, patient is able to give clear and coherent history Speech/Language: Speech is garbled and delayed, expressive aphasia.  Dysarthria present  Cranial Nerves:  II: PERRL.  Decreased right blink to threat III, IV, VI: EOMI. Eyelids elevate symmetrically.  V: Sensation is intact to light touch and symmetrical to face.  VII: Face is symmetrical resting and smiling VIII: hearing intact to voice. IX, X: Palate elevates symmetrically. Phonation is normal.  KP:Dynloizm shrug 5/5. XII: tongue is midline without fasciculations. Motor:  Mild  right hemiparesis right upper extremity 3/5 on the right lower extremity 4/5 strength Decreased strength and coordination to right hand. Tone: is normal and bulk is normal Sensation-decreased to right arm and leg Coordination: FTN intact bilaterally, HKS: no ataxia in BLE.No drift.  Gait- deferred  Most Recent NIH: 8   ASSESSMENT/PLAN  Acute Ischemic Infarct:  left precentral and middle frontal gyrus with petechial hemorrhage  Etiology: Likely cryptogenic but pending full stroke work-up, likely embolic branch infarct  Code Stroke CT head: No acute abnormality. Small vessel disease.  ASPECTS 10.    CTA head & neck: No LVO  MRI   Acute infarct of the left precentral and middle frontal gyrus with petechial hemorrhage  2D Echo: PENDING VAS US  LE DVT: PENDING LDL 23 HgbA1c 5.3 VTE prophylaxis - SCDs Plavix  prior to admission (changed from aspirin  after [previous small stroke in 2017).  Now on aspirin  and brilinta  x 4 weeks and then aspirin  alone.  (Ok with Ortho per their note) Therapy recommendations:  CIR Disposition:  pending  Hx of Stroke/TIA Left MCA Stroke 2017, embolic EF 50-55%, no cardiac source of emboli identified Was on aspirin  prior to stroke, it was changed to Plavix  Saw Dr. Ines outpatient following  Hypertension Home meds: Toprol -XL 25 mg, Stable Permissive hypertension for 48 hours from stroke symptom onset, then gradually normalize Avoid hypotension.   Hyperlipidemia Home meds: Crestor  20 mg, resumed in hospital LDL 23, goal <  70 Continue statin at discharge  Diabetes type II, no history Home meds:  none HgbA1c 5.3, goal < 7.0 CBGs SSI  Other Stroke Risk Factors Obesity, Body mass index is 30.21 kg/m., BMI >/= 30 associated with increased stroke risk, recommend weight loss, diet and exercise as appropriate  Coronary artery disease  Other Active Problems Parkinson's Continue home med, Sinemet  Sees Dr. Evonnie outpatient  Hospital day # 1   Pt  seen by Neuro NP/APP and later by MD. Note/plan to be edited by MD as needed.    Rocky JAYSON Likes, DNP, AGACNP-BC [facility] Please use AMION for contact information & EPIC for messaging.  I have personally obtained history,examined this patient, reviewed notes, independently viewed imaging studies, participated in medical decision making and plan of care.ROS completed by me personally and pertinent positives fully documented  I have made any additions or clarifications directly to the above note. Agree with note above.  Patient developed embolic left MCA branch infarct following knee amputation procedure.  Recommend changing to aspirin  and Brilinta  for 4 weeks followed by aspirin  alone and patient will need prolonged cardiac monitoring for 30 days after discharge to look for paroxysmal A-fib.  Physical Occupational Therapy and rehab consults.  Continue ongoing stroke workup.  Aggressive risk factor modification.  Follow-up with Dr. Evonnie neurologist for Parkinson's as outpatient.  Greater than 50% time during this 60-minute consultation visit was spent in counseling and coordination of care about his embolic stroke and discussion about evaluation and treatment and prevention and answering questions.  Eather Popp, MD Medical Director [facility] Pager: [phone] 01/12/2023 3:28 PM  To contact Stroke Continuity provider, please refer to [email or web address]. After hours, contact General Neurology

## 2023-01-12 NOTE — Evaluation (Signed)
 Occupational Therapy Evaluation Patient Details Name: Joshua [name] MRN: [id number] DOB: 1945/07/03 Today's Date: 01/12/2023   History of Present Illness 77 yo man who developed acute onset R sided weakness and aphasia after R TKA 01/11/23. NIHSS 10; aphasia; MRI Acute infarct of the left precentral gyrus and middle frontal gyrus  with petechial hemorrhage at the infarct site  PMH  CAD, carotid disease, HL, hx loop recprder, HTN, MI, Parkinsons, Stroke 2017, and vertigo   Clinical Impression   Pt reports ind at baseline with  ADL/functional mobility, lives with spouse. Pt currently with speech, visual and RUE deficits. Pt following commands appropriately. Needing up to mod A for ADLs, and min A for transfers with RW. Use of R walker splint to keep RUE on RW. Noted some difficulty tracking and attending to R side during session. Pt presenting with impairments listed below, will follow acutely. Patient will benefit from intensive inpatient follow up therapy, >3 hours/day to maximize safety/ind with ADL/functional mobility.       If plan is discharge home, recommend the following: A little help with walking and/or transfers;A lot of help with bathing/dressing/bathroom;Assistance with cooking/housework;Direct supervision/assist for medications management;Direct supervision/assist for financial management;Assist for transportation;Help with stairs or ramp for entrance    Functional Status Assessment  Patient has had a recent decline in their functional status and demonstrates the ability to make significant improvements in function in a reasonable and predictable amount of time.  Equipment Recommendations  BSC/3in1;Other (comment) (RW)    Recommendations for Other Services PT consult;Speech consult;Rehab consult     Precautions / Restrictions Precautions Precautions: Knee;Fall Precaution Booklet Issued: No Precaution Comments: Educated on keeping knee straight Restrictions Weight  Bearing Restrictions Per Provider Order: Yes RLE Weight Bearing Per Provider Order: Weight bearing as tolerated      Mobility Bed Mobility               General bed mobility comments: OOB in chair upon arrival and departure    Transfers Overall transfer level: Needs assistance Equipment used: Rolling walker (2 wheels) Transfers: Sit to/from Stand Sit to Stand: Min assist           General transfer comment: use of R walker splint      Balance Overall balance assessment: Needs assistance Sitting-balance support: Single extremity supported, Feet supported Sitting balance-Leahy Scale: Fair   Postural control: Right lateral lean Standing balance support: Bilateral upper extremity supported Standing balance-Leahy Scale: Poor                             ADL either performed or assessed with clinical judgement   ADL Overall ADL's : Needs assistance/impaired Eating/Feeding: Minimal assistance   Grooming: Minimal assistance   Upper Body Bathing: Moderate assistance   Lower Body Bathing: Moderate assistance   Upper Body Dressing : Moderate assistance   Lower Body Dressing: Moderate assistance   Toilet Transfer: Minimal assistance;Ambulation;Rolling walker (2 wheels)   Toileting- Clothing Manipulation and Hygiene: Minimal assistance       Functional mobility during ADLs: Minimal assistance;Rolling walker (2 wheels)       Vision Baseline Vision/History: 1 Wears glasses Vision Assessment?: Vision impaired- to be further tested in functional context;Yes Eye Alignment: Within Functional Limits Tracking/Visual Pursuits: Decreased smoothness of vertical tracking;Decreased smoothness of horizontal tracking Visual Fields: Right visual field deficit Additional Comments: reports harder to see stimuli on R     Perception Perception: Impaired Preception Impairment Details: Inattention/Neglect  Perception-Other Comments: R inattention   Praxis Praxis: Not  tested       Pertinent Vitals/Pain Pain Assessment Pain Assessment: Faces Pain Score: 2  Faces Pain Scale: Hurts a little bit Pain Location: R knee Pain Descriptors / Indicators: Operative site guarding Pain Intervention(s): Limited activity within patient's tolerance, Monitored during session     Extremity/Trunk Assessment Upper Extremity Assessment Upper Extremity Assessment: Right hand dominant RUE Deficits / Details: weaker distally vs proximally, 2-/5 grasp, cannot hold onto RW, able to form weak fist, can elevate shoulder 90*, can bring hand to mouth with incr time, unable to coordinate wrist flex/ext movement. reports hand feels numb RUE Sensation: decreased proprioception;decreased light touch RUE Coordination: decreased fine motor;decreased gross motor   Lower Extremity Assessment Lower Extremity Assessment: Defer to PT evaluation RLE Deficits / Details: post-op TKA; able to flex to 90 in sitting; able to SLR   Cervical / Trunk Assessment Cervical / Trunk Assessment: Normal   Communication Communication Communication: Difficulty communicating thoughts/reduced clarity of speech Cueing Techniques: Verbal cues;Gestural cues   Cognition Arousal: Alert Behavior During Therapy: WFL for tasks assessed/performed, Agitated Overall Cognitive Status: Difficult to assess                                 General Comments: aphasic, seemingly frustrated with speech     General Comments  VSS    Exercises     Shoulder Instructions      Home Living Family/patient expects to be discharged to:: Private residence Living Arrangements: Spouse/significant other Available Help at Discharge: Family;Available 24 hours/day Type of Home: House Home Access: Level entry     Home Layout: One level     Bathroom Shower/Tub: Producer, Television/film/video: Handicapped height Bathroom Accessibility: Yes   Home Equipment: Agricultural Consultant (2 wheels);Shower seat;Grab  bars - tub/shower      Lives With: Spouse    Prior Functioning/Environment Prior Level of Function : Independent/Modified Independent             Mobility Comments: cane PRN ADLs Comments: not driving        OT Problem List: Decreased strength;Decreased range of motion;Decreased activity tolerance;Impaired balance (sitting and/or standing);Impaired vision/perception;Decreased safety awareness;Impaired UE functional use;Impaired sensation      OT Treatment/Interventions: Self-care/ADL training;Therapeutic exercise;Energy conservation;DME and/or AE instruction;Therapeutic activities;Patient/family education;Balance training;Cognitive remediation/compensation;Visual/perceptual remediation/compensation;Neuromuscular education    OT Goals(Current goals can be found in the care plan section) Acute Rehab OT Goals Patient Stated Goal: pt did not state OT Goal Formulation: With patient Time For Goal Achievement: 01/26/23 Potential to Achieve Goals: Good ADL Goals Pt Will Perform Upper Body Dressing: with contact guard assist;sitting Pt Will Perform Lower Body Dressing: with contact guard assist;sit to/from stand;sitting/lateral leans Pt Will Transfer to Toilet: with modified independence;ambulating;regular height toilet Pt/caregiver will Perform Home Exercise Program: With minimal assist;Right Upper extremity;Increased strength;Increased ROM;With written HEP provided  OT Frequency: Min 1X/week    Co-evaluation              AM-PAC OT 6 Clicks Daily Activity     Outcome Measure Help from another person eating meals?: A Little Help from another person taking care of personal grooming?: A Little Help from another person toileting, which includes using toliet, bedpan, or urinal?: A Lot Help from another person bathing (including washing, rinsing, drying)?: A Lot Help from another person to put on and taking off regular upper body clothing?: A  Lot Help from another person to put  on and taking off regular lower body clothing?: A Lot 6 Click Score: 14   End of Session Equipment Utilized During Treatment: Gait belt;Rolling walker (2 wheels) Nurse Communication: Mobility status  Activity Tolerance: Patient tolerated treatment well Patient left: in chair;with call bell/phone within reach;with chair alarm set  OT Visit Diagnosis: Unsteadiness on feet (R26.81);Other abnormalities of gait and mobility (R26.89);Muscle weakness (generalized) (M62.81)                Time: 8951-8881 OT Time Calculation (min): 30 min Charges:  OT General Charges $OT Visit: 1 Visit OT Evaluation $OT Eval Moderate Complexity: 1 Mod OT Treatments $Self Care/Home Management : 8-22 mins  Terin Dierolf K, OTD, OTR/L SecureChat Preferred Acute Rehab [phone]   Laneta POUR Koonce 01/12/2023, 12:22 PM

## 2023-01-12 NOTE — Progress Notes (Signed)
  Inpatient Rehab Admissions Coordinator :  Per therapy recommendations, patient was screened for CIR candidacy by Ottie Glazier RN MSN.  At this time patient appears to be a potential candidate for CIR. I will place a rehab consult per protocol for full assessment. Please call me with any questions.  Ottie Glazier RN MSN Admissions Coordinator [phone]

## 2023-01-12 NOTE — Progress Notes (Signed)
 Orthopedic Tech Progress Note Patient Details:  Joshua [name] 1945/12/30 [id number]  Added fresh ice with family at bedside   CPM Right Knee CPM Right Knee: On Right Knee Flexion (Degrees): 0 Right Knee Extension (Degrees): 60  Post Interventions Patient Tolerated: Well Instructions Provided: Care of device  [name] Pac 01/12/2023, 4:00 PM

## 2023-01-13 ENCOUNTER — Inpatient Hospital Stay (HOSPITAL_COMMUNITY): Payer: Medicare HMO

## 2023-01-13 DIAGNOSIS — I639 Cerebral infarction, unspecified: Secondary | ICD-10-CM | POA: Diagnosis not present

## 2023-01-13 DIAGNOSIS — I6389 Other cerebral infarction: Secondary | ICD-10-CM | POA: Diagnosis not present

## 2023-01-13 LAB — ECHOCARDIOGRAM COMPLETE
AR max vel: 2.46 cm2
AV Area VTI: 3.09 cm2
AV Area mean vel: 2.47 cm2
AV Mean grad: 5 mm[Hg]
AV Peak grad: 8.3 mm[Hg]
Ao pk vel: 1.44 m/s
Area-P 1/2: 3.48 cm2
Height: 73 in
S' Lateral: 3.2 cm
Weight: 3664.01 [oz_av]

## 2023-01-13 LAB — BASIC METABOLIC PANEL
Anion gap: 7 (ref 5–15)
BUN: 24 mg/dL — ABNORMAL HIGH (ref 8–23)
CO2: 23 mmol/L (ref 22–32)
Calcium: 8.5 mg/dL — ABNORMAL LOW (ref 8.9–10.3)
Chloride: 105 mmol/L (ref 98–111)
Creatinine, Ser: 0.74 mg/dL (ref 0.61–1.24)
GFR, Estimated: >60 mL/min (ref >60)
Glucose, Bld: 112 mg/dL — ABNORMAL HIGH (ref 70–99)
Potassium: 4.1 mmol/L (ref 3.5–5.1)
Sodium: 135 mmol/L (ref 135–145)

## 2023-01-13 LAB — CBC
HCT: 35.5 % — ABNORMAL LOW (ref 39.0–52.0)
Hemoglobin: 11.8 g/dL — ABNORMAL LOW (ref 13.0–17.0)
MCH: 30.8 pg (ref 26.0–34.0)
MCHC: 33.2 g/dL (ref 30.0–36.0)
MCV: 92.7 fL (ref 80.0–100.0)
Platelets: 192 10*3/uL (ref 150–400)
RBC: 3.83 MIL/uL — ABNORMAL LOW (ref 4.22–5.81)
RDW: 13.7 % (ref 11.5–15.5)
WBC: 9.5 10*3/uL (ref 4.0–10.5)
nRBC: 0 % (ref 0.0–0.2)

## 2023-01-13 LAB — LIPID PANEL
Cholesterol: 109 mg/dL (ref 0–200)
HDL: 44 mg/dL (ref >40)
LDL Cholesterol: 43 mg/dL (ref 0–99)
Total CHOL/HDL Ratio: 2.5 {ratio}
Triglycerides: 108 mg/dL (ref <150)
VLDL: 22 mg/dL (ref 0–40)

## 2023-01-13 MED ORDER — ASPIRIN 81 MG PO CHEW
81.0000 mg | CHEWABLE_TABLET | Freq: Every day | ORAL | Status: DC
Start: 2023-01-13 — End: 2023-01-15
  Administered 2023-01-13 – 2023-01-15 (×3): 81 mg via ORAL
  Filled 2023-01-13 (×3): qty 1

## 2023-01-13 MED ORDER — TICAGRELOR 90 MG PO TABS
90.0000 mg | ORAL_TABLET | Freq: Two times a day (BID) | ORAL | Status: DC
  Administered 2023-01-13 – 2023-01-15 (×5): 90 mg via ORAL
  Filled 2023-01-13 (×5): qty 1

## 2023-01-13 NOTE — Progress Notes (Signed)
 Orthopedic Tech Progress Note Patient Details:  Joshua [name] 08/15/1945 [id number]  CPM Right Knee CPM Right Knee: On Right Knee Flexion (Degrees): 70 Right Knee Extension (Degrees): 0  Post Interventions Patient Tolerated: Well Instructions Provided: Care of device  Adine MARLA Blush 01/13/2023, 2:25 PM

## 2023-01-13 NOTE — Progress Notes (Signed)
 VASCULAR LAB    Bilateral lower extremity venous duplex has been performed.  See CV proc for preliminary results.   Sherian Valenza, RVT 01/13/2023, 11:04 AM

## 2023-01-13 NOTE — Progress Notes (Signed)
 Subjective: Patient reports pain as moderate. Stroke work up continues. Has mobilized OOB with PT with some impairments d/t postop stroke and impaired R hand grip strength. Making steady improvements. Eager to progress.  Objective:   VITALS:   Vitals:   01/12/23 2352 01/13/23 0342 01/13/23 0800 01/13/23 1138  BP: 120/75 138/80 113/77 128/82  Pulse:  79 75 75  Resp: 17 20 18 16   Temp: (!) 97.4 F (36.3 C) 98 F (36.7 C) 98 F (36.7 C) 97.8 F (36.6 C)  TempSrc: Oral Oral  Oral  SpO2: 98% 93% 93% 92%  Weight:      Height:          Latest Ref Rng & Units 01/13/2023    7:14 AM 01/12/2023    9:12 AM 12/30/2022    1:20 PM  CBC  WBC 4.0 - 10.5 K/uL 9.5  11.5  6.0   Hemoglobin 13.0 - 17.0 g/dL 88.1  87.4  85.8   Hematocrit 39.0 - 52.0 % 35.5  36.8  44.7   Platelets 150 - 400 K/uL 192  218  242       Latest Ref Rng & Units 01/13/2023   12:22 AM 01/12/2023    9:12 AM 12/30/2022    1:20 PM  BMP  Glucose 70 - 99 mg/dL 887  842  887   BUN 8 - 23 mg/dL 24  23  16    Creatinine 0.61 - 1.24 mg/dL 9.25  9.07  9.15   Sodium 135 - 145 mmol/L 135  136  136   Potassium 3.5 - 5.1 mmol/L 4.1  3.9  5.3   Chloride 98 - 111 mmol/L 105  105  104   CO2 22 - 32 mmol/L 23  21  25    Calcium  8.9 - 10.3 mg/dL 8.5  8.8  9.2    Intake/Output      12/31 0701 01/01 0700 01/01 0701 01/02 0700   P.O. 381    I.V. (mL/kg)     IV Piggyback     Total Intake(mL/kg) 381 (3.7)    Urine (mL/kg/hr) 1800 (0.7)    Blood     Total Output 1800    Net -1419            Physical Exam: General: NAD.  Working with PT when I walked in, doing well, able to mobilize with walker and get back in bed Resp: No increased wob Cardio: regular rate and rhythm ABD soft Neurologically intact MSK Neurovascularly intact Sensation intact distally Intact pulses distally Dorsiflexion/Plantar flexion intact Incision: dressing C/D/I, minimal strike through Starting to move R hand more    Assessment: 2 Days  Post-Op  S/P Procedure(s) (LRB): TOTAL KNEE ARTHROPLASTY (Right) by Dr. Edna on 01/11/23  Principal Problem:   Primary osteoarthritis of right knee Active Problems:   Localized osteoarthritis of right knee   Plan: Stroke workup so far shows likely embolic branch infarct   Up with therapy- PT, OT, ST Incentive Spirometry Elevate and Apply ice  Weightbearing: WBAT RLE Insicional and dressing care: Dressings left intact until follow-up and Reinforce dressings as needed Orthopedic device(s):  CPM  and blue foam block Showering: Keep dressing dry VTE prophylaxis:  neuro recs ASA 81mg  daily and Brilinta  90mg  bid x 30 days followed by ASA alone after that  , SCDs, ambulation Pain control: PRN, minimize narcotic use as able due to delirium risk Follow - up plan: 2 weeks in office with Dr. Edna Pass information for today:  Evalene Chancy MD, Gerard Large PA-C  Dispo:  Notes seem to indicate possible CIR candidate. Ok to d/c there when medically ready.      Gerard CHRISTELLA Large, PA-C Office [phone] 01/13/2023, 1:49 PM

## 2023-01-13 NOTE — Plan of Care (Signed)
  Problem: Education: Goal: Knowledge of General Education information will improve Description: Including pain rating scale, medication(s)/side effects and non-pharmacologic comfort measures Outcome: Progressing   Problem: Health Behavior/Discharge Planning: Goal: Ability to manage health-related needs will improve Outcome: Progressing   Problem: Clinical Measurements: Goal: Ability to maintain clinical measurements within normal limits will improve Outcome: Progressing Goal: Will remain free from infection Outcome: Progressing Goal: Diagnostic test results will improve Outcome: Progressing Goal: Respiratory complications will improve Outcome: Progressing Goal: Cardiovascular complication will be avoided Outcome: Progressing   Problem: Activity: Goal: Risk for activity intolerance will decrease Outcome: Progressing   Problem: Nutrition: Goal: Adequate nutrition will be maintained Outcome: Progressing   Problem: Coping: Goal: Level of anxiety will decrease Outcome: Progressing   Problem: Elimination: Goal: Will not experience complications related to bowel motility Outcome: Progressing Goal: Will not experience complications related to urinary retention Outcome: Progressing   Problem: Pain Management: Goal: General experience of comfort will improve Outcome: Progressing   Problem: Safety: Goal: Ability to remain free from injury will improve Outcome: Progressing   Problem: Skin Integrity: Goal: Risk for impaired skin integrity will decrease Outcome: Progressing   Problem: Education: Goal: Knowledge of disease or condition will improve Outcome: Progressing Goal: Knowledge of secondary prevention will improve (MUST DOCUMENT ALL) Outcome: Progressing Goal: Knowledge of patient specific risk factors will improve Alonso N/A or DELETE if not current risk factor) Outcome: Progressing   Problem: Ischemic Stroke/TIA Tissue Perfusion: Goal: Complications of ischemic  stroke/TIA will be minimized Outcome: Progressing   Problem: Coping: Goal: Will verbalize positive feelings about self Outcome: Progressing Goal: Will identify appropriate support needs Outcome: Progressing   Problem: Health Behavior/Discharge Planning: Goal: Ability to manage health-related needs will improve Outcome: Progressing Goal: Goals will be collaboratively established with patient/family Outcome: Progressing   Problem: Self-Care: Goal: Ability to participate in self-care as condition permits will improve Outcome: Progressing Goal: Verbalization of feelings and concerns over difficulty with self-care will improve Outcome: Progressing Goal: Ability to communicate needs accurately will improve Outcome: Progressing   Problem: Nutrition: Goal: Risk of aspiration will decrease Outcome: Progressing Goal: Dietary intake will improve Outcome: Progressing   Problem: Education: Goal: Knowledge of the prescribed therapeutic regimen will improve Outcome: Progressing Goal: Individualized Educational Video(s) Outcome: Progressing   Problem: Activity: Goal: Ability to avoid complications of mobility impairment will improve Outcome: Progressing Goal: Range of joint motion will improve Outcome: Progressing   Problem: Clinical Measurements: Goal: Postoperative complications will be avoided or minimized Outcome: Progressing   Problem: Pain Management: Goal: Pain level will decrease with appropriate interventions Outcome: Progressing   Problem: Skin Integrity: Goal: Will show signs of wound healing Outcome: Progressing

## 2023-01-13 NOTE — Progress Notes (Signed)
 STROKE TEAM PROGRESS NOTE  SUBJECTIVE : Patient is sitting on the side of the bed.  His brother is at the bedside.  He continues to have expressive aphasia and dysarthria and mild right-sided weakness.  He appears less frustrated today .  Vital signs stable. OBJECTIVE:   Vital signs : Blood pressure 113/77, pulse 75, temperature 98 F (36.7 C), resp. rate 18, height 6' 1 (1.854 m), weight 103.9 kg, SpO2 93%.  Physical exam  . Afebrile. Head is nontraumatic. Neck is supple without bruit.    Cardiac exam no murmur or gallop. Lungs are clear to auscultation. Distal pulses are well felt.  Neurological exam :   Awake  Alert oriented x 3.  Dysarthric speech but moderate expressive aphasia but good comprehension.  Extraocular movements full without nystagmus.fundi were not visualized. Vision acuity and fields appear normal. Hearing is normal. Palatal movements are normal. Face symmetric. Tongue midline. Mild right hemiparesis 4/5 strength with significant weakness of right grip and intrinsic hand muscles.  Orbits left or right upper extremity.. Normal sensation. Gait deferred. No results found.   Code Stroke CT head: No acute abnormality. Small vessel disease.  ASPECTS 10.    CTA head & neck: No LVO   MRI   Acute infarct of the left precentral and middle frontal gyrus with petechial hemorrhage   2D Echo: PENDING VAS US  LE DVT: PENDING LDL 23 HgbA1c 5.3   ASSESSMENT:  Acute Ischemic Infarct:  left precentral and middle frontal gyrus with petechial hemorrhage  Etiology: Likely cryptogenic but pending full stroke work-up, likely embolic branch infarct      PLAN: Continue ongoing stroke workup and check echocardiogram and lower extremity venous Doppler.  Recommend aspirin  and Brilinta  for 30 days followed by aspirin  alone and aggressive risk factor modification.  Patient will need 30-day heart monitor at discharge to look for paroxysmal A-fib.  Continue physical occupational and speech  therapy and transfer to inpatient rehab when bed available in the next few days  Greater than 50% time during the 35-minute visit was spent on counseling and coordination of care about his cryptogenic stroke and discussion about evaluation and prevention and treatment and answering questions.  Eather Popp, MD Medical Director [facility] Pager: [phone] 01/13/2023 10:58 AM

## 2023-01-13 NOTE — Progress Notes (Signed)
 Physical Therapy Treatment Patient Details Name: Joshua [name] MRN: [id number] DOB: November 29, 1945 Today's Date: 01/13/2023   History of Present Illness 78 yo man who developed acute onset R sided weakness and aphasia after R TKA 01/11/23. NIHSS 10; aphasia; MRI Acute infarct of the left precentral gyrus and middle frontal gyrus  with petechial hemorrhage at the infarct site  PMH  CAD, carotid disease, HL, hx loop recprder, HTN, MI, Parkinsons, Stroke 2017, and vertigo    PT Comments  Pt in CPM on entry, family present, agreeable to therapy. After CPM removed pt able to perform bed level exercises. Continues to have difficulty with communication and R UE movement, but is able to fully extend his hand. Pt requiring min A for bed mobility, min-modA for transfers, and min A for ambulation in room. Pt with increasing fatigue with gait, difficulty with R LE foot clearance and keeping up right in walker. PT requested pt take a seated rest break at door of room. Pt able to ambulate with better foot clearance and upright posture to return to bed. Discussed mental fatigue with stroke and need to be able to be aware and provide rest for safety. D/c plans remain appropriate at this time. PT will continue to follow acutely.      If plan is discharge home, recommend the following: A little help with walking and/or transfers;Assist for transportation;Help with stairs or ramp for entrance   Can travel by private vehicle      Yes  Equipment Recommendations  Rolling walker (2 wheels) (with Rt walker splint)    Recommendations for Other Services Rehab consult     Precautions / Restrictions Precautions Precautions: Knee;Fall Precaution Booklet Issued: No Precaution Comments: Educated on keeping knee straight Restrictions Weight Bearing Restrictions Per Provider Order: Yes RLE Weight Bearing Per Provider Order: Weight bearing as tolerated     Mobility  Bed Mobility Overal bed mobility: Needs  Assistance Bed Mobility: Supine to Sit     Supine to sit: Contact guard, HOB elevated, Min assist     General bed mobility comments: minA to pull against therapist to scoot hips to EoB incr effort and time to exit bed to left    Transfers Overall transfer level: Needs assistance Equipment used: Rolling walker (2 wheels) Transfers: Sit to/from Stand Sit to Stand: Min assist, From elevated surface, Mod assist           General transfer comment: min A for power up into standing, modA for adjusting R hand grip on splint walker, modA for pushing up from straightback chair after seated rest break    Ambulation/Gait Ambulation/Gait assistance: Min assist Gait Distance (Feet): 10 Feet (x2) Assistive device: Rolling walker (2 wheels) Gait Pattern/deviations: Step-to pattern, Decreased stride length Gait velocity: slowed Gait velocity interpretation: <1.8 ft/sec, indicate of risk for recurrent falls   General Gait Details: assist to hold R hand in place, pt with poor awareness of fatigue, unable to adjust to upright posture and having more difficulty with advancing R LE, request pt have seated rest break, pt with increased difficulty with communication due to fatigue. able to      Modified Rankin (Stroke Patients Only) Modified Rankin (Stroke Patients Only) Pre-Morbid Rankin Score: No significant disability Modified Rankin: Moderately severe disability     Balance Overall balance assessment: Needs assistance Sitting-balance support: Single extremity supported, Feet supported Sitting balance-Leahy Scale: Poor Sitting balance - Comments: slight rt lean; in recliner propped pillow under RUE for edema management and keeping pt  midline Postural control: Right lateral lean Standing balance support: Bilateral upper extremity supported Standing balance-Leahy Scale: Poor                              Cognition Arousal: Alert Behavior During Therapy: WFL for tasks  assessed/performed Overall Cognitive Status: Difficult to assess                                 General Comments: aphasia; can state name, not DOB; yes/no reliable        Exercises Total Joint Exercises Ankle Circles/Pumps: AROM, Both, 10 reps Heel Slides: AROM, Right, 5 reps Straight Leg Raises: AROM, Right, 5 reps Long Arc Quad: AROM, Right, 10 reps Knee Flexion: AROM, Right, 10 reps, Seated    General Comments General comments (skin integrity, edema, etc.): VSS, pt in CPM on entry, left off after session, orthopedic PA in room at endo of session      Pertinent Vitals/Pain Pain Assessment Pain Assessment: Faces Faces Pain Scale: Hurts a little bit Pain Location: Rt kne Pain Descriptors / Indicators: Operative site guarding Pain Intervention(s): Limited activity within patient's tolerance, Monitored during session, Repositioned     PT Goals (current goals can now be found in the care plan section) Acute Rehab PT Goals Patient Stated Goal: unable to state; aphasia PT Goal Formulation: With patient Time For Goal Achievement: 01/26/23 Potential to Achieve Goals: Good Progress towards PT goals: Progressing toward goals    Frequency    7X/week       AM-PAC PT 6 Clicks Mobility   Outcome Measure  Help needed turning from your back to your side while in a flat bed without using bedrails?: None Help needed moving from lying on your back to sitting on the side of a flat bed without using bedrails?: A Little Help needed moving to and from a bed to a chair (including a wheelchair)?: A Little Help needed standing up from a chair using your arms (e.g., wheelchair or bedside chair)?: A Little Help needed to walk in hospital room?: A Little Help needed climbing 3-5 steps with a railing? : Total 6 Click Score: 17    End of Session Equipment Utilized During Treatment: Gait belt Activity Tolerance: Patient tolerated treatment well Patient left: in  chair;with call bell/phone within reach;with chair alarm set Nurse Communication: Mobility status PT Visit Diagnosis: Other abnormalities of gait and mobility (R26.89);Hemiplegia and hemiparesis Hemiplegia - Right/Left: Right Hemiplegia - dominant/non-dominant: Dominant Hemiplegia - caused by: Cerebral infarction     Time: 0323-0351 PT Time Calculation (min) (ACUTE ONLY): 28 min  Charges:    $Gait Training: 8-22 mins $Therapeutic Exercise: 8-22 mins PT General Charges $$ ACUTE PT VISIT: 1 Visit                     Loys Hoselton B. Fleeta Lapidus PT, DPT Acute Rehabilitation Services Please use secure chat or  Call Office [phone]    Almarie KATHEE Fleeta [facility] 01/13/2023, 4:54 PM

## 2023-01-14 ENCOUNTER — Encounter (HOSPITAL_COMMUNITY): Payer: Self-pay | Admitting: Orthopedic Surgery

## 2023-01-14 LAB — CBC
HCT: 36.2 % — ABNORMAL LOW (ref 39.0–52.0)
Hemoglobin: 12 g/dL — ABNORMAL LOW (ref 13.0–17.0)
MCH: 30.3 pg (ref 26.0–34.0)
MCHC: 33.1 g/dL (ref 30.0–36.0)
MCV: 91.4 fL (ref 80.0–100.0)
Platelets: 197 10*3/uL (ref 150–400)
RBC: 3.96 MIL/uL — ABNORMAL LOW (ref 4.22–5.81)
RDW: 13.7 % (ref 11.5–15.5)
WBC: 8.3 10*3/uL (ref 4.0–10.5)
nRBC: 0 % (ref 0.0–0.2)

## 2023-01-14 NOTE — Progress Notes (Signed)
 Orthopedic Tech Progress Note Patient Details:  Joshua [name] 05-10-1945 [id number]  Pt placed in CPM by PT. Spoke with Chet, RN who will call ortho techs in the AM to place him back in the CPM, increasing ROM to 0-90 as tolerated.   Patient ID: Joshua [name], male   DOB: 1945/01/30, 78 y.o.   MRN: [id number]  Joshua [name] 01/14/2023, 6:49 PM

## 2023-01-14 NOTE — Telephone Encounter (Signed)
 Called and spoke to patients wife and informed her that per Dr. Evonnie  She is really sorry to hear this.  He is in good hands as the stroke team is on his case!   Patients wife thanked us  for the call and wanted to let Dr. Evonnie know that the stroke team has been great and patient is going to be moving to the fourth floor soon.

## 2023-01-14 NOTE — Progress Notes (Signed)
 PT Cancellation Note  Patient Details Name: Joshua [name] MRN: [id number] DOB: 1945/11/07   Cancelled Treatment:    Reason Eval/Treat Not Completed: (P) Patient at procedure or test/unavailable Pt is working with SLP. PT will follow back for treatment this afternoon as able.   Dalayna Lauter B. Fleeta Lapidus PT, DPT Acute Rehabilitation Services Please use secure chat or  Call Office [phone]    Almarie KATHEE Fleeta Fleet 01/14/2023, 12:20 PM

## 2023-01-14 NOTE — Plan of Care (Signed)
 Progressing. Will continue to monitor.

## 2023-01-14 NOTE — PMR Pre-admission (Signed)
 PMR Admission Coordinator Pre-Admission Assessment  Patient: Joshua [name] is an 78 y.o., male MRN: [id number] DOB: 1945/09/26 Height: 6' 1 (185.4 cm) Weight: 103.9 kg  Insurance Information HMO:     PPO: yes     PCP:      IPA:      80/20:      OTHER:  PRIMARY: [employer] Medicare      Policy#: [id number]      Subscriber: pt CM Name: Annabella      Phone#: [phone]     Fax#: [phone] Pre-Cert#: [id number] auth for CIR from Tiffany with [employer] Medicare with updates due to her at fax listed above on 1/9.      Employer:  Benefits:  Phone #: [phone]     Name:  Eff. Date: 01/13/23     Deduct: $400 ($0 met)      Out of Pocket Max: $3000 ($0 met)      Life Max: n/a CIR: $250/admit      SNF: 96% coverage, 4% copay Outpatient: 90%     Co-Pay: 10% Home Health: 85%      Co-Pay: 15% DME: 90%     Co-Pay: 10% Providers:  SECONDARY:       Policy#:      Phone#:   Artist:       Phone#:   The Engineer, Materials Information Summary" for patients in Inpatient Rehabilitation Facilities with attached "Privacy Act Statement-Health Care Records" was provided and verbally reviewed with: Patient and Family  Emergency Contact Information Contact Information     Name Relation Home Work Mobile   Massengale,Nancy Spouse [phone]  [phone]      Other Contacts     Name Relation Home Work Mobile   Strong,Katherine Daughter [phone]  [phone]   turner,allison Daughter   [phone]       Current Medical History  Patient Admitting Diagnosis: s/p R TKA c/b L MCA CVA  History of Present Illness: Pt is a 78 y/o male with PMH of CAD, loop recorder, HTN, MI, Parkinson's, CVA in 2017, and vertigo who admitted to [facility] on 01/11/23 for a planned R TKA.  In PACU noted new neuro deficits including R hemiparesis, slurred speech.  NIHSS 10, CT negative.  Unable to give TNK given post-op status.  CTA h/n with no LVO.  Neurology consulted and pt was transferred to [facility].  MRi  confirmed small L frontal stroke in the distal L MCA territory with petechial hemorrhage.  Recommendations for aspirin  and brilinta  x4 weeks, the aspirin  alone, and 30-day cardiact monitor at d/c to evaluate for afib.  Therapy ongoing and pt has been recommended for CIR.   Complete NIHSS TOTAL: 6  Patient's medical record from [facility] has been reviewed by the rehabilitation admission coordinator and physician.  Past Medical History  Past Medical History:  Diagnosis Date   Allergic rhinitis    Anxiety    from chronic pain from surgery- on Cymbalta    Arthritis    Asthma    Barrett's esophagus 03/29/2014   Carotid artery disease (HCC)    Carotid Doppler normal August, 2007   Coronary artery disease    Diverticulosis    Dyslipidemia    GERD (gastroesophageal reflux disease)    History of loop recorder    has since 04/04/15   HTN (hypertension)    takes Metoprolol  for PVC control   Hx of colonic polyps    adenomatous   IFG (impaired fasting glucose)    Myocardial infarction (  HCC)    mild   Neuromuscular disorder (HCC)    parkinson's tremors in hands   Palpitations    Benign PVCs   Parkinson disease (HCC)    Pneumonia    Prostate cancer (HCC)    RBBB (right bundle branch block)    rate related   Shingles    Stroke (HCC) 2017   TIA (transient ischemic attack) 02/2015   Per pt, had 2 strokes   Vertigo     Has the patient had major surgery during 100 days prior to admission? Yes  Family History   family history includes Breast cancer in his paternal grandmother; Clotting disorder in his father; Healthy in his daughter and daughter; Heart attack in his father and paternal grandfather; Heart disease in his brother and father; Hypertension in his mother; Lung cancer in his brother; Prostate cancer in his brother.  Current Medications  Current Facility-Administered Medications:    acetaminophen  (TYLENOL ) tablet 325-650 mg, 325-650 mg, Oral, Q6H PRN, Cockerham, Alicia M,  PA-C, 650 mg at 01/15/23 9178   albuterol  (PROVENTIL ) (2.5 MG/3ML) 0.083% nebulizer solution 2.5 mg, 2.5 mg, Inhalation, Q4H PRN, Cockerham, Alicia M, PA-C   aspirin  chewable tablet 81 mg, 81 mg, Oral, Daily, 81 mg at 01/15/23 0946 **OR** [DISCONTINUED] aspirin  suppository 300 mg, 300 mg, Rectal, Daily, Matthews Elida HERO, MD   azelastine  (ASTELIN ) 0.1 % nasal spray 1 spray, 1 spray, Each Nare, QHS, 1 spray at 01/14/23 2130 **AND** fluticasone  (FLONASE ) 50 MCG/ACT nasal spray 1 spray, 1 spray, Each Nare, QHS, Hammons, Kimberly B, RPH, 1 spray at 01/14/23 2130   carbidopa -levodopa  (SINEMET  CR) 50-200 MG per tablet controlled release 1 tablet, 1 tablet, Oral, QHS, Cockerham, Alicia M, PA-C, 1 tablet at 01/14/23 2127   carbidopa -levodopa  (SINEMET  IR) 25-100 MG per tablet immediate release 2 tablet, 2 tablet, Oral, 5 X Daily, Cockerham, Alicia M, PA-C, 2 tablet at 01/15/23 9053   diphenhydrAMINE  (BENADRYL ) 12.5 MG/5ML elixir 12.5-25 mg, 12.5-25 mg, Oral, Q4H PRN, Cockerham, Alicia M, PA-C   docusate sodium  (COLACE) capsule 100 mg, 100 mg, Oral, BID, Cockerham, Alicia M, PA-C, 100 mg at 01/15/23 9053   escitalopram  (LEXAPRO ) tablet 20 mg, 20 mg, Oral, Daily, Cockerham, Alicia M, PA-C, 20 mg at 01/15/23 9053   finasteride  (PROSCAR ) tablet 5 mg, 5 mg, Oral, QPM, Cockerham, Alicia M, PA-C, 5 mg at 01/14/23 1823   hydrocortisone  (ANUSOL -HC) 2.5 % rectal cream 1 Application, 1 Application, Rectal, BID PRN, Renae, Alicia M, PA-C   HYDROmorphone  (DILAUDID ) injection 0.5-1 mg, 0.5-1 mg, Intravenous, Q4H PRN, Cockerham, Alicia M, PA-C, 1 mg at 01/13/23 2147   ketotifen  (ZADITOR ) 0.035 % ophthalmic solution 1 drop, 1 drop, Both Eyes, BID, Cockerham, Alicia M, PA-C, 1 drop at 01/15/23 9052   loratadine  (CLARITIN ) tablet 10 mg, 10 mg, Oral, QPM, Cockerham, Alicia M, PA-C, 10 mg at 01/14/23 1823   menthol -cetylpyridinium (CEPACOL) lozenge 3 mg, 1 lozenge, Oral, PRN **OR** phenol (CHLORASEPTIC) mouth spray 1 spray, 1  spray, Mouth/Throat, PRN, Cockerham, Alicia M, PA-C   methocarbamol  (ROBAXIN ) tablet 500 mg, 500 mg, Oral, Q6H PRN, 500 mg at 01/15/23 0820 **OR** methocarbamol  (ROBAXIN ) injection 500 mg, 500 mg, Intravenous, Q6H PRN, Cockerham, Alicia M, PA-C   metoprolol  succinate (TOPROL -XL) 24 hr tablet 12.5 mg, 12.5 mg, Oral, QHS, Cockerham, Alicia M, PA-C, 12.5 mg at 01/14/23 2126   mexiletine (MEXITIL ) capsule 150 mg, 150 mg, Oral, BID, Cockerham, Alicia M, PA-C, 150 mg at 01/15/23 0946   midodrine  (PROAMATINE ) tablet 5 mg, 5  mg, Oral, BID PRN, Cockerham, Alicia M, PA-C   montelukast  (SINGULAIR ) tablet 10 mg, 10 mg, Oral, QHS, Cockerham, Alicia M, PA-C, 10 mg at 01/14/23 2126   nitroGLYCERIN  (NITROSTAT ) SL tablet 0.4 mg, 0.4 mg, Sublingual, Q5 min PRN, Cockerham, Alicia M, PA-C   ondansetron  (ZOFRAN ) tablet 4 mg, 4 mg, Oral, Q6H PRN **OR** ondansetron  (ZOFRAN ) injection 4 mg, 4 mg, Intravenous, Q6H PRN, Cockerham, Alicia M, PA-C   Oral care mouth rinse, 15 mL, Mouth Rinse, PRN, Edna Toribio LABOR, MD   oxyCODONE  (Oxy IR/ROXICODONE ) immediate release tablet 5-10 mg, 5-10 mg, Oral, Q4H PRN, Cockerham, Alicia M, PA-C, 5 mg at 01/15/23 9047   pantoprazole  (PROTONIX ) EC tablet 40 mg, 40 mg, Oral, Daily, Cockerham, Alicia M, PA-C, 40 mg at 01/15/23 9052   polyethylene glycol (MIRALAX  / GLYCOLAX ) packet 17 g, 17 g, Oral, Daily PRN, Renae, Alicia M, PA-C   rOPINIRole  (REQUIP ) tablet 1 mg, 1 mg, Oral, TID, Cockerham, Alicia M, PA-C, 1 mg at 01/15/23 9053   rosuvastatin  (CRESTOR ) tablet 20 mg, 20 mg, Oral, QHS, Cockerham, Alicia M, PA-C, 20 mg at 01/14/23 2127   tamsulosin  (FLOMAX ) capsule 0.4 mg, 0.4 mg, Oral, QPC supper, Cockerham, Alicia M, PA-C, 0.4 mg at 01/14/23 1823   ticagrelor  (BRILINTA ) tablet 90 mg, 90 mg, Oral, BID, Sethi, Pramod S, MD, 90 mg at 01/15/23 9053   zolpidem  (AMBIEN ) tablet 10 mg, 10 mg, Oral, QHS, Cockerham, Alicia M, PA-C, 10 mg at 01/14/23 2129  Patients Current Diet:  Diet Order              Diet - low sodium heart healthy           Diet regular Room service appropriate? Yes; Fluid consistency: Thin  Diet effective now                   Precautions / Restrictions Precautions Precautions: Knee, Fall Precaution Booklet Issued: No Precaution Comments: Educated on keeping knee straight when at rest Restrictions Weight Bearing Restrictions Per Provider Order: Yes RLE Weight Bearing Per Provider Order: Weight bearing as tolerated   Has the patient had 2 or more falls or a fall with injury in the past year? No  Prior Activity Level Community (5-7x/wk): occasional cane vs RW depending on pain, indep, still driving PTA  Prior Functional Level Self Care: Did the patient need help bathing, dressing, using the toilet or eating? Independent  Indoor Mobility: Did the patient need assistance with walking from room to room (with or without device)? Independent  Stairs: Did the patient need assistance with internal or external stairs (with or without device)? Independent  Functional Cognition: Did the patient need help planning regular tasks such as shopping or remembering to take medications? Independent  Patient Information Are you of Hispanic, Latino/a,or Spanish origin?: A. No, not of Hispanic, Latino/a, or Spanish origin What is your race?: A. White Do you need or want an interpreter to communicate with a doctor or health care staff?: 0. No  Patient's Response To:  Health Literacy and Transportation Is the patient able to respond to health literacy and transportation needs?: Yes Health Literacy - How often do you need to have someone help you when you read instructions, pamphlets, or other written material from your doctor or pharmacy?: Never In the past 12 months, has lack of transportation kept you from medical appointments or from getting medications?: No In the past 12 months, has lack of transportation kept you from meetings, work, or from getting  things needed for daily living?: No  Home Assistive Devices / Equipment Home Equipment: Agricultural Consultant (2 wheels), Shower seat, Grab bars - tub/shower  Prior Device Use: Indicate devices/aids used by the patient prior to current illness, exacerbation or injury? Walker and cane  Current Functional Level Cognition  Arousal/Alertness: Awake/alert Overall Cognitive Status: Difficult to assess Difficult to assess due to: Impaired communication Orientation Level: Oriented X4 General Comments: Follows simple cues with extra time, needing repeated cues to attend to his R side. Attention: Sustained Sustained Attention: Appears intact Memory:  (to be assessed) Awareness: Impaired Awareness Impairment: Emergent impairment Problem Solving:  (will assess) Safety/Judgment: Impaired    Extremity Assessment (includes Sensation/Coordination)  Upper Extremity Assessment: Right hand dominant RUE Deficits / Details: weaker distally vs proximally, 2-/5 grasp, cannot hold onto RW, able to form weak fist, can elevate shoulder 90*, can bring hand to mouth with incr time, unable to coordinate wrist flex/ext movement. reports hand feels numb RUE Sensation: decreased proprioception, decreased light touch RUE Coordination: decreased fine motor, decreased gross motor  Lower Extremity Assessment: Defer to PT evaluation RLE Deficits / Details: post-op TKA; able to flex to 90 in sitting; able to SLR    ADLs  Overall ADL's : Needs assistance/impaired Eating/Feeding: Minimal assistance Grooming: Minimal assistance Upper Body Bathing: Moderate assistance Lower Body Bathing: Moderate assistance Upper Body Dressing : Moderate assistance Lower Body Dressing: Moderate assistance Toilet Transfer: Minimal assistance, Ambulation, Rolling walker (2 wheels) Toileting- Clothing Manipulation and Hygiene: Minimal assistance Functional mobility during ADLs: Minimal assistance, Rolling walker (2 wheels)    Mobility   Overal bed mobility: Needs Assistance Bed Mobility: Supine to Sit, Sit to Supine Supine to sit: Contact guard, HOB elevated Sit to supine: Min assist, HOB elevated General bed mobility comments: Verbal and tactile cues provided for pt to look at his R extremities to manage his R leg off EOB and push through his R UE to ascend his trunk to sit up, extra time needed, CGA for safety.    Transfers  Overall transfer level: Needs assistance Equipment used: Rolling walker (2 wheels) Transfers: Sit to/from Stand Sit to Stand: Mod assist, Contact guard assist, Min assist General transfer comment: Verbal cues provided for pt to place R hand on RW and L hand on bed/chair to push up to stand. mod A the first rep from EOB, fading to min A to CGA with task practicve from chair x3    Ambulation / Gait / Stairs / Wheelchair Mobility  Ambulation/Gait Ambulation/Gait assistance: Editor, Commissioning (Feet): 130 Feet Assistive device: Rolling walker (2 wheels) Gait Pattern/deviations: Decreased stride length, Step-through pattern, Decreased step length - right, Decreased step length - left, Decreased dorsiflexion - right, Trunk flexed, Knee flexed in stance - right General Gait Details: Verbal cues and tactile cues provided provided at R quads during stance to promote R knee extension. Good success noted. MinA for balance. Cues provided also for upright posture and wider BOS, fair momentary success noted Gait velocity: slowed Gait velocity interpretation: <1.31 ft/sec, indicative of household ambulator    Posture / Balance Dynamic Sitting Balance Sitting balance - Comments: Able to sit EOB with supervision for safety Balance Overall balance assessment: Needs assistance Sitting-balance support: Feet supported, No upper extremity supported Sitting balance-Leahy Scale: Fair Sitting balance - Comments: Able to sit EOB with supervision for safety Postural control: Right lateral lean Standing balance  support: Bilateral upper extremity supported Standing balance-Leahy Scale: Poor Standing balance comment: Reliant on RW  Special needs/care consideration CPM  and Skin R knee surgical incision   Previous Home Environment (from acute therapy documentation) Living Arrangements: Spouse/significant other  Lives With: Spouse Available Help at Discharge: Family, Available 24 hours/day Type of Home: House Home Layout: One level Home Access: Level entry Bathroom Shower/Tub: Health Visitor: Handicapped height Bathroom Accessibility: Yes  Discharge Living Setting Plans for Discharge Living Setting: Patient's home, Lives with (comment) (spouse) Type of Home at Discharge: House Discharge Home Layout: One level Discharge Home Access: Level entry Discharge Bathroom Shower/Tub: Tub/shower unit, Walk-in shower Discharge Bathroom Toilet: Handicapped height Discharge Bathroom Accessibility: Yes How Accessible: Accessible via walker Does the patient have any problems obtaining your medications?: No  Social/Family/Support Systems Patient Roles: Spouse Anticipated Caregiver: spouse, [name], and daughters [place] and [place] Anticipated Caregiver's Contact Information: [name] [phone]; comer [phone]; Isaiah [phone] Ability/Limitations of Caregiver: supervision for mobility, min assist for ADLs Caregiver Availability: 24/7 Discharge Plan Discussed with Primary Caregiver: Yes Is Caregiver In Agreement with Plan?: Yes Does Caregiver/Family have Issues with Lodging/Transportation while Pt is in Rehab?: No  Goals Patient/Family Goal for Rehab: PT/OT supervision, SLP supervision to min assist Expected length of stay: 12-14 days Additional Information: Discharge plan: home with spouse as primary caregiver 24/7, also has supportive daughters who can assist PRN Pt/Family Agrees to Admission and willing to participate: Yes Program Orientation Provided & Reviewed with  Pt/Caregiver Including Roles  & Responsibilities: Yes  Decrease burden of Care through IP rehab admission: n/a  Possible need for SNF placement upon discharge: Not anticipated.  Plan for d/c home with spouse as primary caregiver 24/7 and daughters available to help PRN.   Patient Condition: I have reviewed medical records from [facility], spoken with CM, and patient, spouse, and daughter. I met with patient at the bedside for inpatient rehabilitation assessment.  Patient will benefit from ongoing PT, OT, and SLP, can actively participate in 3 hours of therapy a day 5 days of the week, and can make measurable gains during the admission.  Patient will also benefit from the coordinated team approach during an Inpatient Acute Rehabilitation admission.  The patient will receive intensive therapy as well as Rehabilitation physician, nursing, social worker, and care management interventions.  Due to bladder management, safety, skin/wound care, medication administration, pain management, and patient education the patient requires 24 hour a day rehabilitation nursing.  The patient is currently min assist with mobility and basic ADLs.  Discharge setting and therapy post discharge at home with home health is anticipated.  Patient has agreed to participate in the Acute Inpatient Rehabilitation Program and will admit today.  Preadmission Screen Completed By:  Reche FORBES Lowers, PT, DPT 01/15/2023 10:44 AM ______________________________________________________________________   Discussed status with Dr. Babs on 01/15/23  at 10:44 AM  and received approval for admission today.  Admission Coordinator:  Caitlin E Warren, PT, time 10:44 AM Pattricia 01/15/23     Assessment/Plan: Diagnosis: embolic left frontal infarct after right TKA Does the need for close, 24 hr/day Medical supervision in concert with the patient's rehab needs make it unreasonable for this patient to be served in a less intensive setting?  Yes Co-Morbidities requiring supervision/potential complications: HTN, MI, PD, prior cva Due to bladder management, bowel management, safety, skin/wound care, disease management, medication administration, pain management, and patient education, does the patient require 24 hr/day rehab nursing? Yes Does the patient require coordinated care of a physician, rehab nurse, PT, OT, and SLP to address physical and functional deficits in the  context of the above medical diagnosis(es)? Yes Addressing deficits in the following areas: balance, endurance, locomotion, strength, transferring, bowel/bladder control, bathing, dressing, feeding, grooming, toileting, cognition, speech, swallowing, and psychosocial support Can the patient actively participate in an intensive therapy program of at least 3 hrs of therapy 5 days a week? Yes The potential for patient to make measurable gains while on inpatient rehab is excellent Anticipated functional outcomes upon discharge from inpatient rehab: supervision PT, supervision OT, supervision and min assist SLP Estimated rehab length of stay to reach the above functional goals is: 12-14 days Anticipated discharge destination: Home 10. Overall Rehab/Functional Prognosis: excellent   MD Signature: Arthea IVAR Gunther, MD, [facility] Physical Medicine & Rehabilitation Medical Director Rehabilitation Services 01/15/2023

## 2023-01-14 NOTE — Progress Notes (Signed)
 STROKE TEAM PROGRESS NOTE  SUBJECTIVE : Patient is sitting on the side of the bed.  His wife is at the bedside.  He continues to have expressive aphasia and dysarthria and mild right-sided weakness but it appears to be improving.  He appears less frustrated today .  Vital signs stable.  Echocardiogram and lower extremity venous Dopplers are unremarkable. OBJECTIVE:      01/14/2023   11:13 AM 01/14/2023    9:21 AM 01/14/2023    3:41 AM  Vitals with BMI  Systolic 91 97 134  Diastolic 59 58 84  Pulse 76 83 77    Physical exam  . Afebrile. Head is nontraumatic. Neck is supple without bruit.    Cardiac exam no murmur or gallop. Lungs are clear to auscultation. Distal pulses are well felt.  Neurological exam :   Awake  Alert oriented x 3.  Dysarthric speech but moderate expressive aphasia but good comprehension.  Extraocular movements full without nystagmus.fundi were not visualized. Vision acuity and fields appear normal. Hearing is normal. Palatal movements are normal. Face symmetric. Tongue midline. Mild right hemiparesis 4/5 strength with significant weakness of right grip and intrinsic hand muscles.  Orbits left or right upper extremity.. Normal sensation. Gait deferred. No results found.   Code Stroke CT head: No acute abnormality. Small vessel disease.  ASPECTS 10.    CTA head & neck: No LVO   MRI   Acute infarct of the left precentral and middle frontal gyrus with petechial hemorrhage   2D Echo: EF 50 to 55%.  Left atrial size normal.   VAS US  LE DVT: No evidence of DVT LDL 23 HgbA1c 5.3   ASSESSMENT:  Acute Ischemic Infarct:  left precentral and middle frontal gyrus with petechial hemorrhage  Etiology: Likely cryptogenic but pending full stroke work-up, likely embolic branch infarct      PLAN: Continue  aspirin  and Brilinta  for 30 days followed by aspirin  alone and aggressive risk factor modification.  Patient will need 30-day heart monitor at discharge to look for  paroxysmal A-fib.  Continue physical occupational and speech therapy and transfer to inpatient rehab when bed available in the next few days.  Stroke team will sign off.  Kindly call for questions     Eather Popp, MD Medical Director [facility] Pager: [phone] 01/14/2023 3:13 PM

## 2023-01-14 NOTE — Progress Notes (Signed)
 Speech Language Pathology Treatment: Cognitive-Linquistic  Patient Details Name: Joshua [name] MRN: [id number] DOB: 1945/09/13 Today's Date: 01/14/2023 Time: 1206-1237 SLP Time Calculation (min) (ACUTE ONLY): 31 min  Assessment / Plan / Recommendation Clinical Impression  Wife present for therapy focusing on language. Pt's fluency increased today with less hesitations and tends to respond in phrases needing cues to expand to include articles, pronouns for example when stating 2 things he did this morning. Once cued he was able to demonstrate complete sentences. He had significant difficulty reading a written 2 step command and following it and would peserverate on the previous item and noted to have some motor apraxia. Reading a paragraph he would omit and substitute words primarily on the right. When using his finger as a guide he was better able to visualize the words. He answered questions about the paragraph by pointing to correct picture. His wife states that before the stroke he had a hard time with visuospatial abilities and had to stop singing in the choir because he could not stay on the correct line. He answered multiunit/more abstract yes/ questions with 80% (4/5), named common objects with 100% accuracy (improved from evaluation), stated function of objects with 75% accuracy. Pt making progress toward his goals and will continue on the acute venue and is being looked at by inpatient rehab.  HPI HPI: 78 yo man who developed acute onset R sided weakness and aphasia after R TKA 01/11/23. NIHSS 10; aphasia; MRI Acute infarct of the left precentral gyrus and middle frontal gyrus  with petechial hemorrhage at the infarct site  PMH  CAD, carotid disease, HL, hx loop recprder, HTN, MI, Parkinsons, Stroke 2017, and vertigo      SLP Plan  Continue with current plan of care      Recommendations for follow up therapy are one component of a multi-disciplinary discharge planning process, led by  the attending physician.  Recommendations may be updated based on patient status, additional functional criteria and insurance authorization.    Recommendations                   Rehab consult Oral care BID   Frequent or constant Supervision/Assistance Aphasia (R47.01)     Continue with current plan of care     Dustin Olam Bull  01/14/2023, 12:53 PM

## 2023-01-14 NOTE — Progress Notes (Signed)
 Physical Therapy Treatment Patient Details Name: Joshua [name] MRN: [id number] DOB: Jun 19, 1945 Today's Date: 01/14/2023   History of Present Illness 78 yo man who developed acute onset R sided weakness and aphasia after R TKA 01/11/23. NIHSS 10; aphasia; MRI Acute infarct of the left precentral gyrus and middle frontal gyrus  with petechial hemorrhage at the infarct site  PMH  CAD, carotid disease, HL, hx loop recprder, HTN, MI, Parkinsons, Stroke 2017, and vertigo    PT Comments  The pt is making good functional progress. He was able to ambulate an increased distance of up to ~74 ft with a RW and minA today. He was able to better maintain his R hand on the RW when ambulating when not utilizing the RW hand splint today. He benefits from multi-modal cues to perform a step-through gait pattern and extend his R knee during stance phase. Focused part of the session also on increasing R UE and R knee ROM and strength. He demonstrates increased R UE flexor tone, limiting his ability to fully extend his R wrist and fingers. He also demonstrates good R knee flexion ROM but is limited in R knee extension ROM. Educated pt and wife on ensuring his R knee rests in extension, which they confirmed they had been doing. Placed pt in CPM (0-80') and educated RN on how to position pt in CPM, wearing schedule, and progressing pt in CPM per order set. Will continue to follow acutely.    If plan is discharge home, recommend the following: Assist for transportation;Help with stairs or ramp for entrance;A lot of help with walking and/or transfers;A lot of help with bathing/dressing/bathroom;Assistance with cooking/housework;Direct supervision/assist for medications management;Direct supervision/assist for financial management   Can travel by private vehicle        Equipment Recommendations  Rolling walker (2 wheels) (with Rt walker splint)    Recommendations for Other Services Rehab consult     Precautions /  Restrictions Precautions Precautions: Knee;Fall Precaution Booklet Issued: No Precaution Comments: Educated on keeping knee straight when at rest Restrictions Weight Bearing Restrictions Per Provider Order: Yes RLE Weight Bearing Per Provider Order: Weight bearing as tolerated     Mobility  Bed Mobility Overal bed mobility: Needs Assistance Bed Mobility: Supine to Sit, Sit to Supine     Supine to sit: Contact guard, HOB elevated Sit to supine: Min assist, HOB elevated   General bed mobility comments: Verbal and tactile cues provided for pt to look at his R extremities to manage his R leg off EOB and push through his R UE to ascend his trunk to sit up, extra time needed, CGA for safety. MinA needed to pivot pt's trunk to ensure pt landed midline in bed upon return to supine.    Transfers Overall transfer level: Needs assistance Equipment used: Rolling walker (2 wheels) Transfers: Sit to/from Stand Sit to Stand: Min assist, Mod assist           General transfer comment: Verbal cues provided for pt to place R hand on RW and L hand on bed/chair to push up to stand. MinA the first rep from EOB, modA the second rep from chair. Cues provided for pt to reach back with L UE prior to sitting.    Ambulation/Gait Ambulation/Gait assistance: Min assist Gait Distance (Feet): 74 Feet (x2 bouts of ~18 ft > ~74 ft) Assistive device: Rolling walker (2 wheels) Gait Pattern/deviations: Decreased stride length, Step-through pattern, Decreased step length - right, Decreased step length - left, Decreased  dorsiflexion - right, Trunk flexed, Knee flexed in stance - right Gait velocity: slowed Gait velocity interpretation: <1.31 ft/sec, indicative of household ambulator   General Gait Details: Pt ambulated the first bout with use of the R walker hand splint to try to increase ease with grasping the RW, but pt kept slipping anteriorly out of the splint. He seemed to do better maintaining his R hand on  the RW without it today. Verbal cues and tactile cues provided at R hamstring during swing phase to increase stride and R foot clearance and then provided at R quads during stance to promote R knee extension. Good success noted. MinA for balance. Cues provided also for upright posture, fair momentary success noted   Stairs             Wheelchair Mobility     Tilt Bed    Modified Rankin (Stroke Patients Only) Modified Rankin (Stroke Patients Only) Pre-Morbid Rankin Score: No significant disability Modified Rankin: Moderately severe disability     Balance Overall balance assessment: Needs assistance Sitting-balance support: Feet supported, No upper extremity supported Sitting balance-Leahy Scale: Fair Sitting balance - Comments: Able to sit EOB with supervision for safety   Standing balance support: Bilateral upper extremity supported Standing balance-Leahy Scale: Poor Standing balance comment: Reliant on RW                            Cognition Arousal: Alert Behavior During Therapy: WFL for tasks assessed/performed Overall Cognitive Status: Difficult to assess                                 General Comments: Cognitive assessment limited by aphasia, but pt oriented to self, location, situation and year (needing options to select the correct exact date). Follows simple cues with extra time, needing repeated cues to attend to his R side.        Exercises Total Joint Exercises Heel Slides: Right, 10 reps, Seated, AAROM (with foot on towel, AAROM at end range but otherwise AROM prior to end range) Long Arc Quad: AROM, Right, 10 reps, Seated Other Exercises Other Exercises: propping to R elbow then pushing up through R hand to sit up at EOB again, x5 reps with cues Other Exercises: R hand flexion/extension AROM 10x, sitting EOB Other Exercises: prolonged gentle stretch to R wrist into extension while sitting EOB, to pt tolerance    General  Comments General comments (skin integrity, edema, etc.): educated pt and wife on ensuring pt's R knee rests in extension      Pertinent Vitals/Pain Pain Assessment Pain Assessment: 0-10 Pain Score: 10-Worst pain ever Pain Location: Rt knee Pain Descriptors / Indicators: Operative site guarding, Discomfort, Grimacing, Guarding Pain Intervention(s): Limited activity within patient's tolerance, Monitored during session, Repositioned, Patient requesting pain meds-RN notified, RN gave pain meds during session    Home Living                          Prior Function            PT Goals (current goals can now be found in the care plan section) Acute Rehab PT Goals Patient Stated Goal: to improve and go to [facility] PT Goal Formulation: With patient/family Time For Goal Achievement: 01/26/23 Potential to Achieve Goals: Good Progress towards PT goals: Progressing toward goals    Frequency  7X/week      PT Plan      Co-evaluation              AM-PAC PT 6 Clicks Mobility   Outcome Measure  Help needed turning from your back to your side while in a flat bed without using bedrails?: None Help needed moving from lying on your back to sitting on the side of a flat bed without using bedrails?: A Little Help needed moving to and from a bed to a chair (including a wheelchair)?: A Little Help needed standing up from a chair using your arms (e.g., wheelchair or bedside chair)?: A Lot Help needed to walk in hospital room?: A Little Help needed climbing 3-5 steps with a railing? : Total 6 Click Score: 16    End of Session Equipment Utilized During Treatment: Gait belt Activity Tolerance: Patient tolerated treatment well Patient left: with call bell/phone within reach;in bed;with bed alarm set;with family/visitor present Nurse Communication: Mobility status;Other (comment) (how to use CPM) PT Visit Diagnosis: Other abnormalities of gait and mobility (R26.89);Hemiplegia and  hemiparesis;Unsteadiness on feet (R26.81);Muscle weakness (generalized) (M62.81);Difficulty in walking, not elsewhere classified (R26.2);Other symptoms and signs involving the nervous system (R29.898) Hemiplegia - Right/Left: Right Hemiplegia - dominant/non-dominant: Dominant Hemiplegia - caused by: Cerebral infarction     Time: 8380-8292 PT Time Calculation (min) (ACUTE ONLY): 48 min  Charges:    $Gait Training: 8-22 mins $Therapeutic Exercise: 23-37 mins PT General Charges $$ ACUTE PT VISIT: 1 Visit                     Theo Ferretti, PT, DPT Acute Rehabilitation Services  Office: [phone]    Theo CHRISTELLA Ferretti 01/14/2023, 5:34 PM

## 2023-01-14 NOTE — Progress Notes (Signed)
     Subjective:  Patient reports pain as mild.  In good spirits this morning. Communication limited by patient's aphasia but he is answering questions appropriately.  Encouraged him to work on full extension when in bed.  Objective:   VITALS:   Vitals:   01/13/23 1609 01/13/23 1937 01/13/23 2358 01/14/23 0341  BP: 126/77 114/69 122/85 134/84  Pulse: 73 77 74 77  Resp: 16 16 18 14   Temp: (!) 97.4 F (36.3 C) 98.2 F (36.8 C) 98.2 F (36.8 C) 98.1 F (36.7 C)  TempSrc: Oral Oral Oral Oral  SpO2: 99% 97% 96% 99%  Weight:      Height:        Sensation intact distally Intact pulses distally Dorsiflexion/Plantar flexion intact Incision: dressing C/D/I Compartment soft    Lab Results  Component Value Date   WBC 9.5 01/13/2023   HGB 11.8 (L) 01/13/2023   HCT 35.5 (L) 01/13/2023   MCV 92.7 01/13/2023   PLT 192 01/13/2023   BMET    Component Value Date/Time   NA 135 01/13/2023 0022   NA 136 02/14/2021 1528   K 4.1 01/13/2023 0022   CL 105 01/13/2023 0022   CO2 23 01/13/2023 0022   GLUCOSE 112 (H) 01/13/2023 0022   BUN 24 (H) 01/13/2023 0022   BUN 20 02/14/2021 1528   CREATININE 0.74 01/13/2023 0022   CREATININE 0.88 09/18/2015 1135   CALCIUM  8.5 (L) 01/13/2023 0022   EGFR 73 02/14/2021 1528   GFRNONAA >60 01/13/2023 0022    Xray: Total knee arthroplasty components in good position no adverse features  Assessment/Plan: 3 Days Post-Op   Principal Problem:   Primary osteoarthritis of right knee Active Problems:   Localized osteoarthritis of right knee  S/p R TKA 12/30 post op course complicated by stroke  Neurology following, recs appreciated  Post op recs: WB: WBAT Abx: ancef  Imaging: PACU xrays DVT prophylaxis: Aspirin  and Brilinta  per neurology team Follow up: 2 weeks after surgery for a wound check with Dr. Edna at [facility].  Address: [address], [place], [place] [address]  Office Phone: [phone]   TORIBIO DELENA EDNA 01/14/2023, 7:22 AM   Toribio Edna, MD  Contact information:   [phone] 7am-5pm epic message Dr. Edna, or call office for patient follow up: [phone] After hours and holidays please check [email or web address] for group call information for [facility]

## 2023-01-14 NOTE — Plan of Care (Signed)
  Problem: Education: Goal: Knowledge of General Education information will improve Description: Including pain rating scale, medication(s)/side effects and non-pharmacologic comfort measures Outcome: Progressing   Problem: Health Behavior/Discharge Planning: Goal: Ability to manage health-related needs will improve Outcome: Progressing   Problem: Clinical Measurements: Goal: Ability to maintain clinical measurements within normal limits will improve Outcome: Progressing Goal: Will remain free from infection Outcome: Progressing Goal: Diagnostic test results will improve Outcome: Progressing Goal: Respiratory complications will improve Outcome: Progressing Goal: Cardiovascular complication will be avoided Outcome: Progressing   Problem: Activity: Goal: Risk for activity intolerance will decrease Outcome: Progressing   Problem: Nutrition: Goal: Adequate nutrition will be maintained Outcome: Progressing   Problem: Coping: Goal: Level of anxiety will decrease Outcome: Progressing   Problem: Elimination: Goal: Will not experience complications related to bowel motility Outcome: Progressing Goal: Will not experience complications related to urinary retention Outcome: Progressing   Problem: Pain Management: Goal: General experience of comfort will improve Outcome: Progressing   Problem: Safety: Goal: Ability to remain free from injury will improve Outcome: Progressing   Problem: Skin Integrity: Goal: Risk for impaired skin integrity will decrease Outcome: Progressing   Problem: Education: Goal: Knowledge of disease or condition will improve Outcome: Progressing Goal: Knowledge of secondary prevention will improve (MUST DOCUMENT ALL) Outcome: Progressing Goal: Knowledge of patient specific risk factors will improve Alonso N/A or DELETE if not current risk factor) Outcome: Progressing   Problem: Ischemic Stroke/TIA Tissue Perfusion: Goal: Complications of ischemic  stroke/TIA will be minimized Outcome: Progressing   Problem: Coping: Goal: Will verbalize positive feelings about self Outcome: Progressing Goal: Will identify appropriate support needs Outcome: Progressing   Problem: Health Behavior/Discharge Planning: Goal: Ability to manage health-related needs will improve Outcome: Progressing Goal: Goals will be collaboratively established with patient/family Outcome: Progressing   Problem: Self-Care: Goal: Ability to participate in self-care as condition permits will improve Outcome: Progressing Goal: Verbalization of feelings and concerns over difficulty with self-care will improve Outcome: Progressing Goal: Ability to communicate needs accurately will improve Outcome: Progressing   Problem: Nutrition: Goal: Risk of aspiration will decrease Outcome: Progressing Goal: Dietary intake will improve Outcome: Progressing   Problem: Education: Goal: Knowledge of the prescribed therapeutic regimen will improve Outcome: Progressing Goal: Individualized Educational Video(s) Outcome: Progressing   Problem: Activity: Goal: Ability to avoid complications of mobility impairment will improve Outcome: Progressing Goal: Range of joint motion will improve Outcome: Progressing   Problem: Clinical Measurements: Goal: Postoperative complications will be avoided or minimized Outcome: Progressing   Problem: Pain Management: Goal: Pain level will decrease with appropriate interventions Outcome: Progressing   Problem: Skin Integrity: Goal: Will show signs of wound healing Outcome: Progressing

## 2023-01-14 NOTE — Progress Notes (Signed)
 Inpatient Rehab Admissions Coordinator:   Continue to await determination regarding CIR prior auth request.  Will follow.   Estill Dooms, PT, DPT Admissions Coordinator [phone] 01/14/23  10:59 AM

## 2023-01-15 ENCOUNTER — Encounter (HOSPITAL_COMMUNITY): Payer: Self-pay | Admitting: Physical Medicine & Rehabilitation

## 2023-01-15 ENCOUNTER — Other Ambulatory Visit: Payer: Self-pay

## 2023-01-15 ENCOUNTER — Inpatient Hospital Stay (HOSPITAL_COMMUNITY)
Admission: AD | Admit: 2023-01-15 | Discharge: 2023-01-27 | DRG: 057 | Disposition: A | Discharge status: Home Health | Payer: Medicare HMO | Source: Intra-hospital | Attending: Physical Medicine & Rehabilitation | Admitting: Physical Medicine & Rehabilitation

## 2023-01-15 DIAGNOSIS — I69351 Hemiplegia and hemiparesis following cerebral infarction affecting right dominant side: Principal | ICD-10-CM

## 2023-01-15 DIAGNOSIS — I10 Essential (primary) hypertension: Secondary | ICD-10-CM | POA: Diagnosis present

## 2023-01-15 DIAGNOSIS — I69398 Other sequelae of cerebral infarction: Secondary | ICD-10-CM

## 2023-01-15 DIAGNOSIS — F419 Anxiety disorder, unspecified: Secondary | ICD-10-CM | POA: Diagnosis present

## 2023-01-15 DIAGNOSIS — J45909 Unspecified asthma, uncomplicated: Secondary | ICD-10-CM | POA: Diagnosis present

## 2023-01-15 DIAGNOSIS — L89891 Pressure ulcer of other site, stage 1: Secondary | ICD-10-CM | POA: Diagnosis present

## 2023-01-15 DIAGNOSIS — I252 Old myocardial infarction: Secondary | ICD-10-CM

## 2023-01-15 DIAGNOSIS — M1711 Unilateral primary osteoarthritis, right knee: Secondary | ICD-10-CM | POA: Diagnosis not present

## 2023-01-15 DIAGNOSIS — M25571 Pain in right ankle and joints of right foot: Secondary | ICD-10-CM | POA: Diagnosis not present

## 2023-01-15 DIAGNOSIS — G20B1 Parkinson's disease with dyskinesia, without mention of fluctuations: Secondary | ICD-10-CM | POA: Diagnosis not present

## 2023-01-15 DIAGNOSIS — Z860100 Personal history of colon polyps, unspecified: Secondary | ICD-10-CM | POA: Diagnosis not present

## 2023-01-15 DIAGNOSIS — Z96653 Presence of artificial knee joint, bilateral: Secondary | ICD-10-CM | POA: Diagnosis present

## 2023-01-15 DIAGNOSIS — Z79899 Other long term (current) drug therapy: Secondary | ICD-10-CM | POA: Diagnosis not present

## 2023-01-15 DIAGNOSIS — G20A1 Parkinson's disease without dyskinesia, without mention of fluctuations: Secondary | ICD-10-CM | POA: Diagnosis present

## 2023-01-15 DIAGNOSIS — M25561 Pain in right knee: Secondary | ICD-10-CM | POA: Diagnosis present

## 2023-01-15 DIAGNOSIS — I951 Orthostatic hypotension: Secondary | ICD-10-CM | POA: Diagnosis not present

## 2023-01-15 DIAGNOSIS — Z8673 Personal history of transient ischemic attack (TIA), and cerebral infarction without residual deficits: Secondary | ICD-10-CM | POA: Diagnosis not present

## 2023-01-15 DIAGNOSIS — K2270 Barrett's esophagus without dysplasia: Secondary | ICD-10-CM | POA: Diagnosis present

## 2023-01-15 DIAGNOSIS — R4701 Aphasia: Secondary | ICD-10-CM | POA: Diagnosis present

## 2023-01-15 DIAGNOSIS — Z96651 Presence of right artificial knee joint: Secondary | ICD-10-CM | POA: Diagnosis not present

## 2023-01-15 DIAGNOSIS — I63412 Cerebral infarction due to embolism of left middle cerebral artery: Secondary | ICD-10-CM

## 2023-01-15 DIAGNOSIS — E785 Hyperlipidemia, unspecified: Secondary | ICD-10-CM | POA: Diagnosis present

## 2023-01-15 DIAGNOSIS — I6300 Cerebral infarction due to thrombosis of unspecified precerebral artery: Secondary | ICD-10-CM

## 2023-01-15 DIAGNOSIS — N400 Enlarged prostate without lower urinary tract symptoms: Secondary | ICD-10-CM | POA: Diagnosis present

## 2023-01-15 DIAGNOSIS — I2510 Atherosclerotic heart disease of native coronary artery without angina pectoris: Secondary | ICD-10-CM | POA: Diagnosis present

## 2023-01-15 DIAGNOSIS — I959 Hypotension, unspecified: Secondary | ICD-10-CM | POA: Diagnosis present

## 2023-01-15 DIAGNOSIS — Z801 Family history of malignant neoplasm of trachea, bronchus and lung: Secondary | ICD-10-CM | POA: Diagnosis not present

## 2023-01-15 DIAGNOSIS — K219 Gastro-esophageal reflux disease without esophagitis: Secondary | ICD-10-CM | POA: Diagnosis present

## 2023-01-15 DIAGNOSIS — Z803 Family history of malignant neoplasm of breast: Secondary | ICD-10-CM

## 2023-01-15 DIAGNOSIS — K5901 Slow transit constipation: Secondary | ICD-10-CM | POA: Diagnosis not present

## 2023-01-15 DIAGNOSIS — I69320 Aphasia following cerebral infarction: Secondary | ICD-10-CM | POA: Diagnosis not present

## 2023-01-15 DIAGNOSIS — Z8249 Family history of ischemic heart disease and other diseases of the circulatory system: Secondary | ICD-10-CM

## 2023-01-15 DIAGNOSIS — Z832 Family history of diseases of the blood and blood-forming organs and certain disorders involving the immune mechanism: Secondary | ICD-10-CM

## 2023-01-15 DIAGNOSIS — I639 Cerebral infarction, unspecified: Principal | ICD-10-CM | POA: Diagnosis present

## 2023-01-15 DIAGNOSIS — Z8546 Personal history of malignant neoplasm of prostate: Secondary | ICD-10-CM

## 2023-01-15 DIAGNOSIS — Z7902 Long term (current) use of antithrombotics/antiplatelets: Secondary | ICD-10-CM

## 2023-01-15 DIAGNOSIS — R233 Spontaneous ecchymoses: Secondary | ICD-10-CM | POA: Diagnosis present

## 2023-01-15 DIAGNOSIS — Z8042 Family history of malignant neoplasm of prostate: Secondary | ICD-10-CM

## 2023-01-15 DIAGNOSIS — G8929 Other chronic pain: Secondary | ICD-10-CM | POA: Diagnosis present

## 2023-01-15 DIAGNOSIS — D62 Acute posthemorrhagic anemia: Secondary | ICD-10-CM | POA: Diagnosis present

## 2023-01-15 MED ORDER — ASPIRIN 81 MG PO TBEC: 81.0000 mg | DELAYED_RELEASE_TABLET | Freq: Every day | ORAL | Status: DC

## 2023-01-15 MED ORDER — LORATADINE 10 MG PO TABS
10.0000 mg | ORAL_TABLET | Freq: Every evening | ORAL | Status: DC
  Administered 2023-01-15 – 2023-01-26 (×12): 10 mg via ORAL
  Filled 2023-01-15 (×12): qty 1

## 2023-01-15 MED ORDER — METHOCARBAMOL 500 MG PO TABS
500.0000 mg | ORAL_TABLET | Freq: Four times a day (QID) | ORAL | Status: DC | PRN
  Administered 2023-01-18 – 2023-01-25 (×6): 500 mg via ORAL
  Filled 2023-01-15 (×7): qty 1

## 2023-01-15 MED ORDER — ZOLPIDEM TARTRATE 5 MG PO TABS
10.0000 mg | ORAL_TABLET | Freq: Every day | ORAL | Status: DC
  Administered 2023-01-15 – 2023-01-26 (×12): 10 mg via ORAL
  Filled 2023-01-15 (×12): qty 2

## 2023-01-15 MED ORDER — TICAGRELOR 90 MG PO TABS
90.0000 mg | ORAL_TABLET | Freq: Two times a day (BID) | ORAL | Status: DC
  Administered 2023-01-15 – 2023-01-27 (×24): 90 mg via ORAL
  Filled 2023-01-15 (×24): qty 1

## 2023-01-15 MED ORDER — CARBIDOPA-LEVODOPA 25-100 MG PO TABS
2.0000 | ORAL_TABLET | Freq: Every day | ORAL | Status: DC
Start: 2023-01-15 — End: 2023-01-27
  Administered 2023-01-15 – 2023-01-27 (×55): 2 via ORAL
  Filled 2023-01-15 (×60): qty 2

## 2023-01-15 MED ORDER — ESCITALOPRAM OXALATE 10 MG PO TABS
20.0000 mg | ORAL_TABLET | Freq: Every day | ORAL | Status: DC
  Administered 2023-01-16 – 2023-01-27 (×12): 20 mg via ORAL
  Filled 2023-01-15 (×12): qty 2

## 2023-01-15 MED ORDER — OXYCODONE HCL 5 MG PO TABS
5.0000 mg | ORAL_TABLET | ORAL | Status: DC | PRN
Start: 2023-01-15 — End: 2023-01-27
  Administered 2023-01-16 – 2023-01-19 (×7): 10 mg via ORAL
  Administered 2023-01-19: 5 mg via ORAL
  Administered 2023-01-19 – 2023-01-22 (×8): 10 mg via ORAL
  Administered 2023-01-23: 5 mg via ORAL
  Administered 2023-01-23: 10 mg via ORAL
  Administered 2023-01-24 – 2023-01-26 (×7): 5 mg via ORAL
  Administered 2023-01-27: 10 mg via ORAL
  Filled 2023-01-15 (×7): qty 2
  Filled 2023-01-15: qty 1
  Filled 2023-01-15 (×2): qty 2
  Filled 2023-01-15: qty 1
  Filled 2023-01-15 (×9): qty 2
  Filled 2023-01-15 (×2): qty 1
  Filled 2023-01-15: qty 2
  Filled 2023-01-15: qty 1
  Filled 2023-01-15: qty 2
  Filled 2023-01-15: qty 1
  Filled 2023-01-15: qty 2

## 2023-01-15 MED ORDER — METHOCARBAMOL 500 MG PO TABS
500.0000 mg | ORAL_TABLET | Freq: Three times a day (TID) | ORAL | Status: DC | PRN
Start: 2023-01-15 — End: 2023-01-27

## 2023-01-15 MED ORDER — METOPROLOL SUCCINATE ER 25 MG PO TB24
12.5000 mg | ORAL_TABLET | Freq: Every day | ORAL | Status: DC
  Administered 2023-01-15 – 2023-01-26 (×12): 12.5 mg via ORAL
  Filled 2023-01-15 (×12): qty 1

## 2023-01-15 MED ORDER — ALUM & MAG HYDROXIDE-SIMETH 200-200-20 MG/5ML PO SUSP: 30.0000 mL | ORAL | Status: DC | PRN

## 2023-01-15 MED ORDER — TAMSULOSIN HCL 0.4 MG PO CAPS
0.4000 mg | ORAL_CAPSULE | Freq: Every day | ORAL | Status: DC
  Administered 2023-01-15 – 2023-01-26 (×12): 0.4 mg via ORAL
  Filled 2023-01-15 (×12): qty 1

## 2023-01-15 MED ORDER — POLYETHYLENE GLYCOL 3350 17 G PO PACK: 17.0000 g | PACK | Freq: Every day | ORAL | Status: DC

## 2023-01-15 MED ORDER — FLEET ENEMA RE ENEM
1.0000 | ENEMA | Freq: Once | RECTAL | Status: AC | PRN
  Administered 2023-01-15: 1 via RECTAL
  Filled 2023-01-15: qty 1

## 2023-01-15 MED ORDER — TICAGRELOR 90 MG PO TABS: 90.0000 mg | ORAL_TABLET | Freq: Two times a day (BID) | ORAL | Status: DC

## 2023-01-15 MED ORDER — MEXILETINE HCL 150 MG PO CAPS
150.0000 mg | ORAL_CAPSULE | Freq: Two times a day (BID) | ORAL | Status: DC
  Administered 2023-01-15 – 2023-01-27 (×24): 150 mg via ORAL
  Filled 2023-01-15 (×24): qty 1

## 2023-01-15 MED ORDER — MIDODRINE HCL 5 MG PO TABS: 5.0000 mg | ORAL_TABLET | Freq: Two times a day (BID) | ORAL | Status: DC | PRN

## 2023-01-15 MED ORDER — ORAL CARE MOUTH RINSE
15.0000 mL | OROMUCOSAL | Status: DC | PRN
Start: 2023-01-15 — End: 2023-01-15

## 2023-01-15 MED ORDER — ROSUVASTATIN CALCIUM 20 MG PO TABS
20.0000 mg | ORAL_TABLET | Freq: Every day | ORAL | Status: DC
  Administered 2023-01-15 – 2023-01-26 (×12): 20 mg via ORAL
  Filled 2023-01-15 (×12): qty 1

## 2023-01-15 MED ORDER — FLUTICASONE PROPIONATE 50 MCG/ACT NA SUSP
1.0000 | Freq: Every day | NASAL | Status: DC
  Administered 2023-01-15 – 2023-01-26 (×12): 1 via NASAL
  Filled 2023-01-15: qty 16

## 2023-01-15 MED ORDER — ROPINIROLE HCL 1 MG PO TABS
1.0000 mg | ORAL_TABLET | Freq: Three times a day (TID) | ORAL | Status: DC
  Administered 2023-01-15 – 2023-01-27 (×36): 1 mg via ORAL
  Filled 2023-01-15 (×38): qty 1

## 2023-01-15 MED ORDER — CARBIDOPA-LEVODOPA ER 50-200 MG PO TBCR
1.0000 | EXTENDED_RELEASE_TABLET | Freq: Every day | ORAL | Status: DC
  Administered 2023-01-15 – 2023-01-26 (×12): 1 via ORAL
  Filled 2023-01-15 (×12): qty 1

## 2023-01-15 MED ORDER — ONDANSETRON HCL 4 MG/2ML IJ SOLN: 4.0000 mg | Freq: Four times a day (QID) | INTRAMUSCULAR | Status: DC | PRN

## 2023-01-15 MED ORDER — GUAIFENESIN-DM 100-10 MG/5ML PO SYRP: 10.0000 mL | ORAL_SOLUTION | Freq: Four times a day (QID) | ORAL | Status: DC | PRN

## 2023-01-15 MED ORDER — AZELASTINE HCL 0.1 % NA SOLN
1.0000 | Freq: Every day | NASAL | Status: DC
  Administered 2023-01-15 – 2023-01-26 (×12): 1 via NASAL
  Filled 2023-01-15: qty 30

## 2023-01-15 MED ORDER — DOCUSATE SODIUM 100 MG PO CAPS
100.0000 mg | ORAL_CAPSULE | Freq: Two times a day (BID) | ORAL | Status: DC
  Administered 2023-01-15 – 2023-01-18 (×7): 100 mg via ORAL
  Filled 2023-01-15 (×7): qty 1

## 2023-01-15 MED ORDER — OXYCODONE HCL 5 MG PO TABS: 5.0000 mg | ORAL_TABLET | ORAL | Status: DC | PRN

## 2023-01-15 MED ORDER — ONDANSETRON HCL 4 MG PO TABS: 4.0000 mg | ORAL_TABLET | Freq: Four times a day (QID) | ORAL | Status: DC | PRN

## 2023-01-15 MED ORDER — POLYETHYLENE GLYCOL 3350 17 G PO PACK: 17.0000 g | PACK | Freq: Every day | ORAL | Status: DC | PRN

## 2023-01-15 MED ORDER — MONTELUKAST SODIUM 10 MG PO TABS
10.0000 mg | ORAL_TABLET | Freq: Every day | ORAL | Status: DC
  Administered 2023-01-15 – 2023-01-26 (×12): 10 mg via ORAL
  Filled 2023-01-15 (×12): qty 1

## 2023-01-15 MED ORDER — KETOTIFEN FUMARATE 0.035 % OP SOLN
1.0000 [drp] | Freq: Two times a day (BID) | OPHTHALMIC | Status: DC
  Administered 2023-01-15 – 2023-01-27 (×24): 1 [drp] via OPHTHALMIC
  Filled 2023-01-15 (×2): qty 5

## 2023-01-15 MED ORDER — FINASTERIDE 5 MG PO TABS
5.0000 mg | ORAL_TABLET | Freq: Every evening | ORAL | Status: DC
  Administered 2023-01-15 – 2023-01-26 (×12): 5 mg via ORAL
  Filled 2023-01-15 (×12): qty 1

## 2023-01-15 MED ORDER — BISACODYL 5 MG PO TBEC
5.0000 mg | DELAYED_RELEASE_TABLET | Freq: Every day | ORAL | Status: DC | PRN
Start: 2023-01-15 — End: 2023-01-27
  Administered 2023-01-18: 5 mg via ORAL
  Filled 2023-01-15: qty 1

## 2023-01-15 MED ORDER — ASPIRIN 81 MG PO CHEW
81.0000 mg | CHEWABLE_TABLET | Freq: Every day | ORAL | Status: DC
  Administered 2023-01-16 – 2023-01-27 (×12): 81 mg via ORAL
  Filled 2023-01-15 (×12): qty 1

## 2023-01-15 MED ORDER — PANTOPRAZOLE SODIUM 40 MG PO TBEC
40.0000 mg | DELAYED_RELEASE_TABLET | Freq: Every day | ORAL | Status: DC
  Administered 2023-01-16 – 2023-01-27 (×12): 40 mg via ORAL
  Filled 2023-01-15 (×13): qty 1

## 2023-01-15 MED ORDER — ACETAMINOPHEN 325 MG PO TABS
325.0000 mg | ORAL_TABLET | ORAL | Status: DC | PRN
  Administered 2023-01-21 – 2023-01-27 (×3): 650 mg via ORAL
  Filled 2023-01-15 (×3): qty 2

## 2023-01-15 NOTE — Discharge Summary (Signed)
 Physician Discharge Summary  Patient ID: Joshua [name] MRN: [id number] DOB/AGE: 09-01-1945 78 y.o.  Admit date: 01/11/2023 Discharge date: 01/15/2023  Admission Diagnoses:  Primary osteoarthritis of right knee Acute Ischemic Infarct   Discharge Diagnoses:  Principal Problem:   Primary osteoarthritis of right knee Active Problems:   Localized osteoarthritis of right knee   Past Medical History:  Diagnosis Date   Allergic rhinitis    Anxiety    from chronic pain from surgery- on Cymbalta    Arthritis    Asthma    Barrett's esophagus 03/29/2014   Carotid artery disease (HCC)    Carotid Doppler normal August, 2007   Coronary artery disease    Diverticulosis    Dyslipidemia    GERD (gastroesophageal reflux disease)    History of loop recorder    has since 04/04/15   HTN (hypertension)    takes Metoprolol  for PVC control   Hx of colonic polyps    adenomatous   IFG (impaired fasting glucose)    Myocardial infarction (HCC)    mild   Neuromuscular disorder (HCC)    parkinson's tremors in hands   Palpitations    Benign PVCs   Parkinson disease (HCC)    Pneumonia    Prostate cancer (HCC)    RBBB (right bundle branch block)    rate related   Shingles    Stroke (HCC) 2017   TIA (transient ischemic attack) 02/2015   Per pt, had 2 strokes   Vertigo     Surgeries: Procedure(s): TOTAL KNEE ARTHROPLASTY on 01/11/2023   Consultants (if any):   Discharged Condition: Improved  Hospital Course: Joshua [name] is an 78 y.o. male who was admitted 01/11/2023 with a diagnosis of Primary osteoarthritis of right knee and went to the operating room on 01/11/2023 and underwent right total knee arthroplasty.  In PACU was found to be confused and right upper extremity weakness.  Code stroke was called.  He was found to have an acute ischemic infarct.  Neurology and stroke team were consulted.  Patient transferred to [facility] for monitoring on the neurology unit.  Patient has  done well.  Started on aspirin  and Brilinta .  He has mobilized well and seeing gradual improvement in function of the right upper extremity.  And deemed stable for discharge on 01/15/2023  He was given perioperative antibiotics:  Anti-infectives (From admission, onward)    Start     Dose/Rate Route Frequency Ordered Stop   01/11/23 1800  ceFAZolin  (ANCEF ) IVPB 2g/100 mL premix        2 g 200 mL/hr over 30 Minutes Intravenous Every 6 hours 01/11/23 1758 01/12/23 0300   01/11/23 1800  ceFAZolin  (ANCEF ) 2-4 GM/100ML-% IVPB       Note to Pharmacy: Ward, Christa K: cabinet override      01/11/23 1800 01/11/23 1812   01/11/23 0645  ceFAZolin  (ANCEF ) IVPB 2g/100 mL premix        2 g 200 mL/hr over 30 Minutes Intravenous On call to O.R. 01/11/23 9355 01/11/23 0939     .  He was given sequential compression devices, early ambulation, and aspirin  and Brilinta  for DVT prophylaxis.  He benefited maximally from the hospital stay.  Recent vital signs:  Vitals:   01/14/23 2345 01/15/23 0447  BP: 121/70 113/74  Pulse: 72 72  Resp: 16 18  Temp: 98.2 F (36.8 C) 98.1 F (36.7 C)  SpO2: 96% 95%    Recent laboratory studies:  Lab Results  Component Value Date  HGB 12.0 (L) 01/14/2023   HGB 11.8 (L) 01/13/2023   HGB 12.5 (L) 01/12/2023   Lab Results  Component Value Date   WBC 8.3 01/14/2023   PLT 197 01/14/2023   Lab Results  Component Value Date   INR 1.0 02/05/2021   Lab Results  Component Value Date   NA 135 01/13/2023   K 4.1 01/13/2023   CL 105 01/13/2023   CO2 23 01/13/2023   BUN 24 (H) 01/13/2023   CREATININE 0.74 01/13/2023   GLUCOSE 112 (H) 01/13/2023    Discharge Medications:   Allergies as of 01/15/2023       Reactions   Floxin I.v. In Dextrose  5% [ofloxacin] Other (See Comments)   Lowers BP   Terazosin Other (See Comments)   Lower bp        Medication List     STOP taking these medications    acetaminophen  650 MG CR tablet Commonly known as:  TYLENOL  Replaced by: acetaminophen  500 MG tablet   clopidogrel  75 MG tablet Commonly known as: Plavix        TAKE these medications    acetaminophen  500 MG tablet Commonly known as: TYLENOL  Take 2 tablets (1,000 mg total) by mouth every 8 (eight) hours as needed. Replaces: acetaminophen  650 MG CR tablet   albuterol  108 (90 Base) MCG/ACT inhaler Commonly known as: VENTOLIN  HFA Inhale 2 puffs into the lungs every 4 (four) hours as needed for wheezing or shortness of breath.   aspirin  EC 81 MG tablet Take 1 tablet (81 mg total) by mouth daily for 28 days. Swallow whole.   azelastine  0.05 % ophthalmic solution Commonly known as: OPTIVAR  Place 1 drop into both eyes 2 (two) times daily.   BIOFREEZE EX Apply 1 Application topically daily as needed (pain).   Biotin 10000 MCG Tabs Take 1,000 mcg by mouth in the morning.   carbidopa -levodopa  25-100 MG tablet Commonly known as: SINEMET  IR 2 TABLETS AT 7 AM, 2 TABLETS AT 10 AM,2 TABLETS AT 1PM,2 TABLETS AT 4 PM /2 TABLETS AT 7 PM   carbidopa -levodopa  50-200 MG tablet Commonly known as: SINEMET  CR TAKE 1 TABLET BY MOUTH EVERYDAY AT BEDTIME   cholecalciferol  25 MCG (1000 UNIT) tablet Commonly known as: VITAMIN D3 Take 1,000 Units by mouth daily.   cyanocobalamin 1000 MCG tablet Commonly known as: VITAMIN B12 Take 1,000 mcg by mouth daily.   Dymista  137-50 MCG/ACT Susp Generic drug: Azelastine -Fluticasone  Place 1 puff into both nostrils at bedtime.   escitalopram  20 MG tablet Commonly known as: LEXAPRO  Take 20 mg by mouth daily.   finasteride  5 MG tablet Commonly known as: PROSCAR  Take 5 mg by mouth every evening.   Fish Oil 1000 MG Caps Take 1,000 mg by mouth in the morning.   lansoprazole  30 MG capsule Commonly known as: PREVACID  Take 30 mg by mouth 2 (two) times daily. Morning & mid-afternoon   levocetirizine 5 MG tablet Commonly known as: XYZAL  Take 5 mg by mouth every evening.   methocarbamol  500 MG  tablet Commonly known as: ROBAXIN  Take 1 tablet (500 mg total) by mouth every 8 (eight) hours as needed for up to 10 days for muscle spasms.   metoprolol  succinate 25 MG 24 hr tablet Commonly known as: TOPROL -XL TAKE 1/2 TABLET BY MOUTH DAILY What changed: when to take this   mexiletine 150 MG capsule Commonly known as: MEXITIL  TAKE 1 CAPSULE BY MOUTH TWICE A DAY   midodrine  5 MG tablet Commonly known as: PROAMATINE  TAKE 1  TABLET BY MOUTH 2 TIMES DAILY AS NEEDED. TAKE IF SYSTOLIC BLOOD PRESSURE GETS BELOW 100 What changed: See the new instructions.   montelukast  10 MG tablet Commonly known as: SINGULAIR  Take 10 mg by mouth at bedtime.   nitroGLYCERIN  0.4 MG SL tablet Commonly known as: NITROSTAT  Place 1 tablet (0.4 mg total) under the tongue every 5 (five) minutes as needed for chest pain (Do not give more than 3 SL tablets in 15 minutes.).   ondansetron  4 MG tablet Commonly known as: Zofran  Take 1 tablet (4 mg total) by mouth every 8 (eight) hours as needed for up to 14 days for nausea or vomiting.   oxyCODONE  5 MG immediate release tablet Commonly known as: Roxicodone  Take 1 tablet (5 mg total) by mouth every 4 (four) hours as needed for up to 7 days for severe pain (pain score 7-10) or moderate pain (pain score 4-6).   polyethylene glycol 17 g packet Commonly known as: MiraLax  Take 17 g by mouth daily. What changed:  when to take this reasons to take this   polyethylene glycol 17 g packet Commonly known as: MiraLax  Take 17 g by mouth daily. What changed: You were already taking a medication with the same name, and this prescription was added. Make sure you understand how and when to take each.   Proctosol HC 2.5 % rectal cream Generic drug: hydrocortisone  APPLY INTO AND AROUND RECTUM 2 TIMES A DAY What changed: See the new instructions.   rOPINIRole  1 MG tablet Commonly known as: REQUIP  TAKE 1 TABLET BY MOUTH 3 TIMES DAILY.   rosuvastatin  20 MG  tablet Commonly known as: CRESTOR  Take 1 tablet (20 mg total) by mouth at bedtime.   tamsulosin  0.4 MG Caps capsule Commonly known as: FLOMAX  Take 0.4 mg by mouth daily after supper.   ticagrelor  90 MG Tabs tablet Commonly known as: BRILINTA  Take 1 tablet (90 mg total) by mouth 2 (two) times daily.   zolpidem  10 MG tablet Commonly known as: AMBIEN  Take 10 mg by mouth at bedtime.        Diagnostic Studies: VAS US  LOWER EXTREMITY VENOUS (DVT) Result Date: 01/13/2023  Lower Venous DVT Study Patient Name:  SHAMOND SKELTON  Date of Exam:   01/13/2023 Medical Rec #: [id number]             Accession #:    [id number] Date of Birth: 1945/05/16            Patient Gender: M Patient Age:   44 years Exam Location:  [facility] Procedure:      VAS US  LOWER EXTREMITY VENOUS (DVT) Referring Phys: Calea Hribar --------------------------------------------------------------------------------  Indications: Swelling, and stroke.  Risk Factors: Right total knee replacement 01/11/23, upon awakening from anesthesia, weakness and aphasia noted. Comparison Study: No prior study on file Performing Technologist: Alberta Lis RVS  Examination Guidelines: A complete evaluation includes B-mode imaging, spectral Doppler, color Doppler, and power Doppler as needed of all accessible portions of each vessel. Bilateral testing is considered an integral part of a complete examination. Limited examinations for reoccurring indications may be performed as noted. The reflux portion of the exam is performed with the patient in reverse Trendelenburg.  +---------+---------------+---------+-----------+----------+--------------+ RIGHT    CompressibilityPhasicitySpontaneityPropertiesThrombus Aging +---------+---------------+---------+-----------+----------+--------------+ CFV      Full           Yes      Yes                                  +---------+---------------+---------+-----------+----------+--------------+  SFJ      Full                                                        +---------+---------------+---------+-----------+----------+--------------+ FV Prox  Full                                                        +---------+---------------+---------+-----------+----------+--------------+ FV Mid   Full                                                        +---------+---------------+---------+-----------+----------+--------------+ FV DistalFull                                                        +---------+---------------+---------+-----------+----------+--------------+ PFV      Full                                                        +---------+---------------+---------+-----------+----------+--------------+ POP      Full           Yes      Yes                                 +---------+---------------+---------+-----------+----------+--------------+ PTV      Full                                                        +---------+---------------+---------+-----------+----------+--------------+ PERO     Full                                                        +---------+---------------+---------+-----------+----------+--------------+ Gastroc  Full                                                        +---------+---------------+---------+-----------+----------+--------------+   +---------+---------------+---------+-----------+----------+--------------+ LEFT     CompressibilityPhasicitySpontaneityPropertiesThrombus Aging +---------+---------------+---------+-----------+----------+--------------+ CFV      Full           Yes      Yes                                 +---------+---------------+---------+-----------+----------+--------------+  SFJ      Full                                                         +---------+---------------+---------+-----------+----------+--------------+ FV Prox  Full                                                        +---------+---------------+---------+-----------+----------+--------------+ FV Mid   Full                                                        +---------+---------------+---------+-----------+----------+--------------+ FV DistalFull                                                        +---------+---------------+---------+-----------+----------+--------------+ PFV      Full                                                        +---------+---------------+---------+-----------+----------+--------------+ POP      Full           Yes      Yes                                 +---------+---------------+---------+-----------+----------+--------------+ PTV      Full                                                        +---------+---------------+---------+-----------+----------+--------------+ PERO     Full                                                        +---------+---------------+---------+-----------+----------+--------------+     Summary: RIGHT: - There is no evidence of deep vein thrombosis in the lower extremity.  - No cystic structure found in the popliteal fossa.  LEFT: - There is no evidence of deep vein thrombosis in the lower extremity.  - No cystic structure found in the popliteal fossa.  *See table(s) above for measurements and observations. Electronically signed by Penne Colorado MD on 01/13/2023 at 5:34:36 PM.    Final    ECHOCARDIOGRAM COMPLETE Result Date: 01/13/2023    ECHOCARDIOGRAM REPORT   Patient Name:   BRITT PETRONI Date of Exam: 01/13/2023 Medical Rec #:  [id number]  Height:       73.0 in Accession #:    [id number]           Weight:       229.0 lb Date of Birth:  08-11-1945           BSA:          2.279 m Patient Age:    77 years             BP:           113/77 mmHg Patient Gender: M                     HR:           76 bpm. Exam Location:  Inpatient Procedure: 2D Echo, Cardiac Doppler and Color Doppler Indications:    Stroke  History:        Patient has prior history of Echocardiogram examinations, most                 recent 05/27/2022. CAD, Stroke; Risk Factors:Hypertension.  Sonographer:    Jayson Gaskins Referring Phys: [id number] ROCKY JAYSON LIKES IMPRESSIONS  1. Left ventricular ejection fraction, by estimation, is 50 to 55%. The left ventricle has low normal function. Left ventricular endocardial border not optimally defined to evaluate regional wall motion. There is mild left ventricular hypertrophy. Left ventricular diastolic parameters are indeterminate.  2. RV not well visualized. Grossly appears normal in size and function. . Right ventricular systolic function was not well visualized. The right ventricular size is not well visualized.  3. The mitral valve is normal in structure. No evidence of mitral valve regurgitation. No evidence of mitral stenosis.  4. The aortic valve has an indeterminant number of cusps. There is mild calcification of the aortic valve. There is mild thickening of the aortic valve. Aortic valve regurgitation is not visualized. No aortic stenosis is present.  5. The inferior vena cava is normal in size with greater than 50% respiratory variability, suggesting right atrial pressure of 3 mmHg. FINDINGS  Left Ventricle: Left ventricular ejection fraction, by estimation, is 50 to 55%. The left ventricle has low normal function. Left ventricular endocardial border not optimally defined to evaluate regional wall motion. The left ventricular internal cavity  size was normal in size. There is mild left ventricular hypertrophy. Left ventricular diastolic parameters are indeterminate. Right Ventricle: RV not well visualized. Grossly appears normal in size and function. The right ventricular size is not well visualized. Right vetricular wall thickness was not well visualized. Right  ventricular systolic function was not well visualized. Left Atrium: Left atrial size was normal in size. Right Atrium: Right atrial size was normal in size. Pericardium: There is no evidence of pericardial effusion. Mitral Valve: The mitral valve is normal in structure. No evidence of mitral valve regurgitation. No evidence of mitral valve stenosis. Tricuspid Valve: The tricuspid valve is normal in structure. Tricuspid valve regurgitation is not demonstrated. No evidence of tricuspid stenosis. Aortic Valve: The aortic valve has an indeterminant number of cusps. There is mild calcification of the aortic valve. There is mild thickening of the aortic valve. There is mild aortic valve annular calcification. Aortic valve regurgitation is not visualized. No aortic stenosis is present. Aortic valve mean gradient measures 5.0 mmHg. Aortic valve peak gradient measures 8.3 mmHg. Aortic valve area, by VTI measures 3.09 cm. Pulmonic Valve: The pulmonic valve was not well visualized. Pulmonic valve regurgitation is not visualized. No evidence of pulmonic  stenosis. Aorta: The aortic root is normal in size and structure. Venous: The inferior vena cava is normal in size with greater than 50% respiratory variability, suggesting right atrial pressure of 3 mmHg. IAS/Shunts: No atrial level shunt detected by color flow Doppler.  LEFT VENTRICLE PLAX 2D LVIDd:         4.60 cm   Diastology LVIDs:         3.20 cm   LV e' medial:    5.98 cm/s LV PW:         1.20 cm   LV E/e' medial:  13.4 LV IVS:        1.20 cm   LV e' lateral:   9.79 cm/s LVOT diam:     1.90 cm   LV E/e' lateral: 8.2 LV SV:         78 LV SV Index:   34 LVOT Area:     2.84 cm  RIGHT VENTRICLE RV S prime:     11.20 cm/s TAPSE (M-mode): 2.2 cm LEFT ATRIUM           Index        RIGHT ATRIUM           Index LA Vol (A2C): 36.5 ml 16.02 ml/m  RA Area:     10.50 cm LA Vol (A4C): 43.1 ml 18.91 ml/m  RA Volume:   19.60 ml  8.60 ml/m  AORTIC VALVE AV Area (Vmax):    2.46 cm  AV Area (Vmean):   2.47 cm AV Area (VTI):     3.09 cm AV Vmax:           144.00 cm/s AV Vmean:          104.000 cm/s AV VTI:            0.253 m AV Peak Grad:      8.3 mmHg AV Mean Grad:      5.0 mmHg LVOT Vmax:         125.00 cm/s LVOT Vmean:        90.600 cm/s LVOT VTI:          0.276 m LVOT/AV VTI ratio: 1.09  AORTA Ao Root diam: 3.30 cm MITRAL VALVE MV Area (PHT): 3.48 cm    SHUNTS MV Decel Time: 218 msec    Systemic VTI:  0.28 m MV E velocity: 80.20 cm/s  Systemic Diam: 1.90 cm MV A velocity: 73.90 cm/s MV E/A ratio:  1.09 Dorn Ross MD Electronically signed by Dorn Ross MD Signature Date/Time: 01/13/2023/11:02:26 AM    Final    MR BRAIN WO CONTRAST Result Date: 01/12/2023 CLINICAL DATA:  Acute neurologic deficit EXAM: MRI HEAD WITHOUT CONTRAST TECHNIQUE: Multiplanar, multiecho pulse sequences of the brain and surrounding structures were obtained without intravenous contrast. COMPARISON:  01/11/2023 CT and 04/20/2018 MRI FINDINGS: Brain: Abnormal diffusion restriction of the left precentral gyrus and affecting the middle frontal gyrus. There is petechial hemorrhage at the infarct site. There is multifocal hyperintense T2-weighted signal within the white matter. Generalized volume loss. The midline structures are normal. Vascular: Normal flow voids. Skull and upper cervical spine: Normal calvarium and skull base. Visualized upper cervical spine and soft tissues are normal. Sinuses/Orbits:No paranasal sinus fluid levels or advanced mucosal thickening. No mastoid or middle ear effusion. Normal orbits. IMPRESSION: Acute infarct of the left precentral gyrus and middle frontal gyrus with petechial hemorrhage at the infarct site. No mass effect or midline shift. Electronically Signed   By: Franky Stanford  M.D.   On: 01/12/2023 03:57   CT ANGIO HEAD NECK W WO CM (CODE STROKE) Result Date: 01/11/2023 CLINICAL DATA:  Neuro deficit, acute, stroke suspected. Right-sided weakness. Aphasia. EXAM: CT  ANGIOGRAPHY HEAD AND NECK WITH AND WITHOUT CONTRAST TECHNIQUE: Multidetector CT imaging of the head and neck was performed using the standard protocol during bolus administration of intravenous contrast. Multiplanar CT image reconstructions and MIPs were obtained to evaluate the vascular anatomy. Carotid stenosis measurements (when applicable) are obtained utilizing NASCET criteria, using the distal internal carotid diameter as the denominator. RADIATION DOSE REDUCTION: This exam was performed according to the departmental dose-optimization program which includes automated exposure control, adjustment of the mA and/or kV according to patient size and/or use of iterative reconstruction technique. CONTRAST:  75mL OMNIPAQUE  IOHEXOL  350 MG/ML SOLN COMPARISON:  Head MRA 03/05/2015 FINDINGS: CTA NECK FINDINGS Aortic arch: Normal variant aortic arch branching pattern with common origin of the brachiocephalic and left common carotid arteries. Mild atherosclerosis without significant stenosis of the arch vessel origins. Right carotid system: Patent with a moderate amount of predominantly calcified plaque at the carotid bifurcation and in the proximal ICA. No evidence of a significant stenosis or dissection. Left carotid system: Patent with a small to moderate amount of predominantly calcified plaque at the carotid bifurcation and in the proximal ICA. No evidence of a significant stenosis or dissection. Vertebral arteries: Patent with the right being mildly dominant. Mild-to-moderate stenosis of the left vertebral artery origin. Skeleton: Solid C5-6 ACDF. Asymmetrically advanced left facet arthrosis in the upper cervical spine. Other neck: No evidence of cervical lymphadenopathy or mass. Upper chest: No mass or consolidation in the included lung apices. Review of the MIP images confirms the above findings CTA HEAD FINDINGS Anterior circulation: The internal carotid arteries are patent from skull base to carotid termini with  mild atherosclerotic calcification bilaterally not resulting in a significant stenosis. ACAs and MCAs are patent without evidence of a proximal branch occlusion or significant proximal stenosis. No aneurysm is identified. Posterior circulation: The intracranial vertebral arteries are patent to the basilar with mild atherosclerotic irregularity but no significant stenosis. Patent PICA and SCA origins are visualized bilaterally. The basilar artery is widely patent. Both PCAs are patent without evidence of a significant proximal stenosis. No aneurysm is identified. Venous sinuses: As permitted by contrast timing, patent. Anatomic variants: None. Review of the MIP images confirms the above findings These results were communicated to Dr. Matthews at 1:37 pm on 01/11/2023 by text page via the [facility] messaging system. IMPRESSION: 1. No large vessel occlusion. 2. Mild-to-moderate left vertebral artery origin stenosis. 3. Mild to moderate cervical carotid atherosclerosis without a significant stenosis. 4.  Aortic Atherosclerosis (ICD10-I70.0). Electronically Signed   By: Dasie Hamburg M.D.   On: 01/11/2023 13:43   DG Knee Right Port Result Date: 01/11/2023 CLINICAL DATA:  Postop. EXAM: PORTABLE RIGHT KNEE - 1-2 VIEW COMPARISON:  None Available. FINDINGS: Right knee arthroplasty in expected alignment. No periprosthetic lucency or fracture. There has been patellar resurfacing. Recent postsurgical change includes air and edema in the soft tissues and joint space. IMPRESSION: Right knee arthroplasty without immediate postoperative complication. Electronically Signed   By: Andrea Gasman M.D.   On: 01/11/2023 13:37   CT HEAD CODE STROKE WO CONTRAST Result Date: 01/11/2023 CLINICAL DATA:  Code stroke.  Neuro deficit, acute, stroke suspected EXAM: CT HEAD WITHOUT CONTRAST TECHNIQUE: Contiguous axial images were obtained from the base of the skull through the vertex without intravenous contrast. RADIATION DOSE REDUCTION:  This  exam was performed according to the departmental dose-optimization program which includes automated exposure control, adjustment of the mA and/or kV according to patient size and/or use of iterative reconstruction technique. COMPARISON:  None Available. FINDINGS: Brain: No evidence of acute infarction, hemorrhage, hydrocephalus, extra-axial collection or mass lesion/mass effect. Patchy white matter hypodensities are nonspecific but compatible with chronic microvascular ischemic change. Vascular: No hyperdense vessel identified. Skull: No acute fracture. Sinuses/Orbits: Clear sinuses.  No acute orbital findings. Other: No mastoid effusions. ASPECTS [facility] Stroke Program Early CT Score) Total score (0-10 with 10 being normal): 10. IMPRESSION: 1. No evidence of acute intracranial abnormality. ASPECTS is 10. 2. Chronic microvascular ischemic change. Code stroke imaging results were communicated on 01/11/2023 at 1:19 pm to provider Matthews via telephone, who verbally acknowledged these results. Electronically Signed   By: Gilmore GORMAN Molt M.D.   On: 01/11/2023 13:20    Disposition: Discharge disposition: [facility]       Discharge Instructions     Call MD / Call 911   Complete by: As directed    If you experience chest pain or shortness of breath, CALL 911 and be transported to the hospital emergency room.  If you develope a fever above 101 F, pus (white drainage) or increased drainage or redness at the wound, or calf pain, call your surgeon's office.   Constipation Prevention   Complete by: As directed    Drink plenty of fluids.  Prune juice may be helpful.  You may use a stool softener, such as Colace (over the counter) 100 mg twice a day.  Use MiraLax  (over the counter) for constipation as needed.   Diet - low sodium heart healthy   Complete by: As directed    Increase activity slowly as tolerated   Complete by: As directed    Post-operative opioid taper instructions:   Complete by: As  directed    POST-OPERATIVE OPIOID TAPER INSTRUCTIONS: It is important to wean off of your opioid medication as soon as possible. If you do not need pain medication after your surgery it is ok to stop day one. Opioids include: Codeine, Hydrocodone(Norco, Vicodin), Oxycodone (Percocet, oxycontin ) and hydromorphone  amongst others.  Long term and even short term use of opiods can cause: Increased pain response Dependence Constipation Depression Respiratory depression And more.  Withdrawal symptoms can include Flu like symptoms Nausea, vomiting And more Techniques to manage these symptoms Hydrate well Eat regular healthy meals Stay active Use relaxation techniques(deep breathing, meditating, yoga) Do Not substitute Alcohol  to help with tapering If you have been on opioids for less than two weeks and do not have pain than it is ok to stop all together.  Plan to wean off of opioids This plan should start within one week post op of your joint replacement. Maintain the same interval or time between taking each dose and first decrease the dose.  Cut the total daily intake of opioids by one tablet each day Next start to increase the time between doses. The last dose that should be eliminated is the evening dose.           Follow-up Information     Edna Toribio LABOR, MD Follow up in 2 week(s).   Specialty: Orthopedic Surgery Contact information: [address] [place] [address] [phone]                    Discharge Instructions      INSTRUCTIONS AFTER JOINT REPLACEMENT   Remove  items at home which could result in a fall. This includes throw rugs or furniture in walking pathways ICE to the affected joint every three hours while awake for 30 minutes at a time, for at least the first 3-5 days, and then as needed for pain and swelling.  Continue to use ice for pain and swelling. You may notice swelling that will progress down to the foot and ankle.   This is normal after surgery.  Elevate your leg when you are not up walking on it.   Continue to use the breathing machine you got in the hospital (incentive spirometer) which will help keep your temperature down.  It is common for your temperature to cycle up and down following surgery, especially at night when you are not up moving around and exerting yourself.  The breathing machine keeps your lungs expanded and your temperature down.  DIET:  As you were doing prior to hospitalization, we recommend a well-balanced diet.  DRESSING / WOUND CARE / SHOWERING:  Keep the surgical dressing until follow up.  The dressing is water  proof, so you can shower without any extra covering.  IF THE DRESSING FALLS OFF or the wound gets wet inside, change the dressing with sterile gauze.  Please use good hand washing techniques before changing the dressing.  Do not use any lotions or creams on the incision until instructed by your surgeon.    ACTIVITY  Increase activity slowly as tolerated, but follow the weight bearing instructions below.   No driving for 6 weeks or until further direction given by your physician.  You cannot drive while taking narcotics.  No lifting or carrying greater than 10 lbs. until further directed by your surgeon. Avoid periods of inactivity such as sitting longer than an hour when not asleep. This helps prevent blood clots.  You may return to work once you are authorized by your doctor.   WEIGHT BEARING: Weight bearing as tolerated with assist device (walker, cane, etc) as directed, use it as long as suggested by your surgeon or therapist, typically at least 4-6 weeks.  EXERCISES  Results after joint replacement surgery are often greatly improved when you follow the exercise, range of motion and muscle strengthening exercises prescribed by your doctor. Safety measures are also important to protect the joint from further injury. Any time any of these exercises cause you to have increased  pain or swelling, decrease what you are doing until you are comfortable again and then slowly increase them. If you have problems or questions, call your caregiver or physical therapist for advice.   Rehabilitation is important following a joint replacement. After just a few days of immobilization, the muscles of the leg can become weakened and shrink (atrophy).  These exercises are designed to build up the tone and strength of the thigh and leg muscles and to improve motion. Often times heat used for twenty to thirty minutes before working out will loosen up your tissues and help with improving the range of motion but do not use heat for the first two weeks following surgery (sometimes heat can increase post-operative swelling).   These exercises can be done on a training (exercise) mat, on the floor, on a table or on a bed. Use whatever works the best and is most comfortable for you.    Use music or television while you are exercising so that the exercises are a pleasant break in your day. This will make your life better with the exercises acting as  a break in your routine that you can look forward to.   Perform all exercises about fifteen times, three times per day or as directed.  You should exercise both the operative leg and the other leg as well.  Exercises include:   Quad Sets - Tighten up the muscle on the front of the thigh (Quad) and hold for 5-10 seconds.   Straight Leg Raises - With your knee straight (if you were given a brace, keep it on), lift the leg to 60 degrees, hold for 3 seconds, and slowly lower the leg.  Perform this exercise against resistance later as your leg gets stronger.  Leg Slides: Lying on your back, slowly slide your foot toward your buttocks, bending your knee up off the floor (only go as far as is comfortable). Then slowly slide your foot back down until your leg is flat on the floor again.  Angel Wings: Lying on your back spread your legs to the side as far apart as  you can without causing discomfort.  Hamstring Strength:  Lying on your back, push your heel against the floor with your leg straight by tightening up the muscles of your buttocks.  Repeat, but this time bend your knee to a comfortable angle, and push your heel against the floor.  You may put a pillow under the heel to make it more comfortable if necessary.   A rehabilitation program following joint replacement surgery can speed recovery and prevent re-injury in the future due to weakened muscles. Contact your doctor or a physical therapist for more information on knee rehabilitation.   CONSTIPATION:  Constipation is defined medically as fewer than three stools per week and severe constipation as less than one stool per week.  Even if you have a regular bowel pattern at home, your normal regimen is likely to be disrupted due to multiple reasons following surgery.  Combination of anesthesia, postoperative narcotics, change in appetite and fluid intake all can affect your bowels.   YOU MUST use at least one of the following options; they are listed in order of increasing strength to get the job done.  They are all available over the counter, and you may need to use some, POSSIBLY even all of these options:    Drink plenty of fluids (prune juice may be helpful) and high fiber foods Colace 100 mg by mouth twice a day  Senokot for constipation as directed and as needed Dulcolax (bisacodyl ), take with full glass of water   Miralax  (polyethylene glycol) once or twice a day as needed.  If you have tried all these things and are unable to have a bowel movement in the first 3-4 days after surgery call either your surgeon or your primary doctor.    If you experience loose stools or diarrhea, hold the medications until you stool forms back up.  If your symptoms do not get better within 1 week or if they get worse, check with your doctor.  If you experience the worst abdominal pain ever or develop nausea or  vomiting, please contact the office immediately for further recommendations for treatment.  ITCHING:  If you experience itching with your medications, try taking only a single pain pill, or even half a pain pill at a time.  You can also use Benadryl  over the counter for itching or also to help with sleep.   TED HOSE STOCKINGS:  Use stockings on both legs until for at least 2 weeks or as directed by physician office. They  may be removed at night for sleeping.  MEDICATIONS:  See your medication summary on the "After Visit Summary" that nursing will review with you.  You may have some home medications which will be placed on hold until you complete the course of blood thinner medication.  It is important for you to complete the blood thinner medication as prescribed.  Blood clot prevention (DVT Prophylaxis): After surgery you are at an increased risk for a blood clot. You were prescribed a blood thinner, Aspirin  81mg , to be taken twice daily for a total of 4 weeks from surgery to help reduce your risk of getting a blood clot.  This is in addition to resuming your Plavix  2 days after surgery (this Wednesday).  Signs of a pulmonary embolus (blood clot in the lungs) include sudden short of breath, feeling lightheaded or dizzy, chest pain with a deep breath, rapid pulse rapid breathing.  Signs of a blood clot in your arms or legs include new unexplained swelling and cramping, warm, red or darkened skin around the painful area.  Please call the office or 911 right away if these signs or symptoms develop.  PRECAUTIONS:   If you experience chest pain or shortness of breath - call 911 immediately for transfer to the hospital emergency department.   If you develop a fever greater that 101 F, purulent drainage from wound, increased redness or drainage from wound, foul odor from the wound/dressing, or calf pain - CONTACT YOUR SURGEON.                                                   FOLLOW-UP APPOINTMENTS:  If you  do not already have a post-op appointment, please call the office for an appointment to be seen by your surgeon.  Guidelines for how soon to be seen are listed in your "After Visit Summary", but are typically between 2-3 weeks after surgery.  If you have a specialized bandage, you may be told to follow up 1 week after surgery.  OTHER INSTRUCTIONS:  Knee Replacement:  Do not place pillow under knee, focus on keeping the knee straight while resting.  Place foam block, curve side up under heel at all times except when walking.  DO NOT modify, tear, cut, or change the foam block in any way.  POST-OPERATIVE OPIOID TAPER INSTRUCTIONS: It is important to wean off of your opioid medication as soon as possible. If you do not need pain medication after your surgery it is ok to stop day one. Opioids include: Codeine, Hydrocodone(Norco, Vicodin), Oxycodone (Percocet, oxycontin ) and hydromorphone  amongst others.  Long term and even short term use of opiods can cause: Increased pain response Dependence Constipation Depression Respiratory depression And more.  Withdrawal symptoms can include Flu like symptoms Nausea, vomiting And more Techniques to manage these symptoms Hydrate well Eat regular healthy meals Stay active Use relaxation techniques(deep breathing, meditating, yoga) Do Not substitute Alcohol  to help with tapering If you have been on opioids for less than two weeks and do not have pain than it is ok to stop all together.  Plan to wean off of opioids This plan should start within one week post op of your joint replacement. Maintain the same interval or time between taking each dose and first decrease the dose.  Cut the total daily intake of opioids by one tablet each day  Next start to increase the time between doses. The last dose that should be eliminated is the evening dose.   MAKE SURE YOU:  Understand these instructions.  Get help right away if you are not doing well or get worse.     Thank you for letting us  be a part of your medical care team.  It is a privilege we respect greatly.  We hope these instructions will help you stay on track for a fast and full recovery!            Signed: Janaisha Tolsma A Chelesea Weiand 01/15/2023, 9:44 AM

## 2023-01-15 NOTE — Progress Notes (Addendum)
 Patient arrived to unit and oriented. Verbalized that he will call for assistance. Cold pack placed on knee, noted knee precautions. 0.3 x 10.1 red area noted to upper R thigh just below groin, possibly related to cold pack margin pressure. Dressing intact. Patient oriented x 4. Family at bedside.   Used 0 degree pillow with family for approx 1 hour this shift

## 2023-01-15 NOTE — Progress Notes (Signed)
 Family asking staff if CPM will be continued. I sent a secure chat to Dr. Blanchie Dessert and he placed updated orders.

## 2023-01-15 NOTE — Discharge Instructions (Addendum)
 Inpatient Rehab Discharge Instructions  Teryn Gust Discharge date and time: 01/27/2023  Activities/Precautions/ Functional Status: Activity: no lifting, driving, or strenuous exercise until cleared by MD Diet: cardiac diet Wound Care: keep wound clean and dry Functional status:  ___ No restrictions     ___ Walk up steps independently ___ 24/7 supervision/assistance   ___ Walk up steps with assistance _x__ Intermittent supervision/assistance  ___ Bathe/dress independently ___ Walk with walker     ___ Bathe/dress with assistance ___ Walk Independently    ___ Shower independently ___ Walk with assistance    _x__ Shower with assistance _x__ No alcohol      ___ Return to work/school ________  Special Instructions: No driving, alcohol  consumption or tobacco use.         STROKE/TIA DISCHARGE INSTRUCTIONS SMOKING Cigarette smoking nearly doubles your risk of having a stroke & is the single most alterable risk factor  If you smoke or have smoked in the last 12 months, you are advised to quit smoking for your health. Most of the excess cardiovascular risk related to smoking disappears within a year of stopping. Ask you doctor about anti-smoking medications [place] Quit Line: 1-800-QUIT NOW Free Smoking Cessation Classes [phone]  CHOLESTEROL Know your levels; limit fat & cholesterol in your diet  Lipid Panel     Component Value Date/Time   CHOL 109 01/13/2023 0714   TRIG 108 01/13/2023 0714   HDL 44 01/13/2023 0714   CHOLHDL 2.5 01/13/2023 0714   VLDL 22 01/13/2023 0714   LDLCALC 43 01/13/2023 0714     Many patients benefit from treatment even if their cholesterol is at goal. Goal: Total Cholesterol (CHOL) less than 160 Goal:  Triglycerides (TRIG) less than 150 Goal:  HDL greater than 40 Goal:  LDL (LDLCALC) less than 100   BLOOD PRESSURE American Stroke Association blood pressure target is less that 120/80 mm/Hg  Your discharge blood pressure is:  BP: 121/85 Monitor  your blood pressure Limit your salt and alcohol  intake Many individuals will require more than one medication for high blood pressure  DIABETES (A1c is a blood sugar average for last 3 months) Goal HGBA1c is under 7% (HBGA1c is blood sugar average for last 3 months)  Diabetes: No known diagnosis of diabetes    Lab Results  Component Value Date   HGBA1C 5.9 (H) 01/12/2023    Your HGBA1c can be lowered with medications, healthy diet, and exercise. Check your blood sugar as directed by your physician Call your physician if you experience unexplained or low blood sugars.  PHYSICAL ACTIVITY/REHABILITATION Goal is 30 minutes at least 4 days per week  Activity: Increase activity slowly, Therapies: Physical Therapy: Home Health, Occupational Therapy: Home Health, and Speech Therapy: Home Health Return to work: n/a Activity decreases your risk of heart attack and stroke and makes your heart stronger.  It helps control your weight and blood pressure; helps you relax and can improve your mood. Participate in a regular exercise program. Talk with your doctor about the best form of exercise for you (dancing, walking, swimming, cycling).  DIET/WEIGHT Goal is to maintain a healthy weight  Your discharge diet is:  Diet Order             Diet regular Room service appropriate? Yes; Fluid consistency: Thin  Diet effective now                  thin liquids Your height is:  Height: 6' 1 (185.4 cm) Your current weight  is: Weight: 102.2 kg Your Body Mass Index (BMI) is:  BMI (Calculated): 29.73 Following the type of diet specifically designed for you will help prevent another stroke. Your goal weight range is:   Your goal Body Mass Index (BMI) is 19-24. Healthy food habits can help reduce 3 risk factors for stroke:  High cholesterol, hypertension, and excess weight.  RESOURCES Stroke/Support Group:  Call [phone]   STROKE EDUCATION PROVIDED/REVIEWED AND GIVEN TO PATIENT Stroke warning signs  and symptoms How to activate emergency medical system (call 911). Medications prescribed at discharge. Need for follow-up after discharge. Personal risk factors for stroke. Pneumonia vaccine given: No Flu vaccine given: No My questions have been answered, the writing is legible, and I understand these instructions.  I will adhere to these goals & educational materials that have been provided to me after my discharge from the hospital.    COMMUNITY REFERRALS UPON DISCHARGE:    Home Health:   PT      OT      ST     SNA                Agency:[facility]    [phone] *Please expect follow-up within 2-3 business days for discharge to schedule your home visit. If you have not received follow-up, be sure to contact the site directly.*      My questions have been answered and I understand these instructions. I will adhere to these goals and the provided educational materials after my discharge from the hospital.  Patient/Caregiver Signature _______________________________ Date __________  Clinician Signature _______________________________________ Date __________  Please bring this form and your medication list with you to all your follow-up doctor's appointments.

## 2023-01-15 NOTE — Progress Notes (Signed)
 Inpatient Rehab Admissions Coordinator:   I have insurance approval and a bed available for pt to admit to CIR today.  Dr. Edna and Dr. Rosemarie in agreement.  Pt/family aware and wish to proceed.  TOC aware.  I will make arrangements.   Reche Lowers, PT, DPT Admissions Coordinator [phone] 01/15/23  11:12 AM

## 2023-01-15 NOTE — Progress Notes (Signed)
 Babs Arthea DASEN, MD  Physician Physical Medicine and Rehabilitation   PMR Pre-admission    Signed   Date of Service: 01/15/2023 10:44 AM  Related encounter: Admission (Discharged) from 01/11/2023 in [facility]   Signed     Expand All Collapse All  PMR Admission Coordinator Pre-Admission Assessment   Patient: Joshua [name] is an 78 y.o., male MRN: [id number] DOB: Jul 25, 1945 Height: 6' 1 (185.4 cm) Weight: 103.9 kg   Insurance Information HMO:     PPO: yes     PCP:      IPA:      80/20:      OTHER:  PRIMARY: [employer] Medicare      Policy#: [id number]      Subscriber: pt CM Name: Annabella      Phone#: [phone]     Fax#: [phone] Pre-Cert#: [id number] auth for CIR from Tiffany with [employer] Medicare with updates due to her at fax listed above on 1/9.      Employer:  Benefits:  Phone #: [phone]     Name:  Eff. Date: 01/13/23     Deduct: $400 ($0 met)      Out of Pocket Max: $3000 ($0 met)      Life Max: n/a CIR: $250/admit      SNF: 96% coverage, 4% copay Outpatient: 90%     Co-Pay: 10% Home Health: 85%      Co-Pay: 15% DME: 90%     Co-Pay: 10% Providers:  SECONDARY:       Policy#:      Phone#:    Artist:       Phone#:    [employer] Engineer[employer] Information Summary" for patients in Inpatient Rehabilitation Facilities with attached "Privacy Act Statement-Health Care Records" was provided and verbally reviewed with: Patient and Family   Emergency Contact Information Contact Information       Name Relation Home Work Mobile    Gladwin,Nancy Spouse [phone]   [phone]         Other Contacts       Name Relation Home Work Mobile    Strong,Katherine Daughter [phone]   [phone]    turner,allison Daughter     [phone]           Current Medical History  Patient Admitting Diagnosis: s/p R TKA c/b L MCA CVA   History of Present Illness: Pt is a 78 y/o male with PMH of CAD, loop recorder, HTN, MI,  Parkinson's, CVA in 2017, and vertigo who admitted to [facility] on 01/11[facility]23 for a planned R TKA.  In PACU noted new neuro deficits including R hemiparesis, slurred speech.  NIHSS 10, CT negative.  Unable to give TNK given post-op status.  CTA h/n with no LVO.  Neurology consulted and pt was transferred to [facility].  MRi confirmed small L frontal stroke in the distal L MCA territory with petechial hemorrhage.  Recommendations for aspirin  and brilinta  x4 weeks, the aspirin  alone, and 30-day cardiact monitor at d/c to evaluate for afib.  Therapy ongoing and pt has been recommended for CIR.    Complete NIHSS TOTAL: 6   Patient's medical record from [facility] has been reviewed by the rehabilitation admission coordinator and physician.   Past Medical History      Past Medical History:  Diagnosis Date   Allergic rhinitis     Anxiety      from chronic pain from surgery- on Cymbalta    Arthritis  Asthma     Barrett's esophagus 03/29/2014   Carotid artery disease (HCC)      Carotid Doppler normal August, 2007   Coronary artery disease     Diverticulosis     Dyslipidemia     GERD (gastroesophageal reflux disease)     History of loop recorder      has since 04/04/15   HTN (hypertension)      takes Metoprolol  for PVC control   Hx of colonic polyps      adenomatous   IFG (impaired fasting glucose)     Myocardial infarction (HCC)      mild   Neuromuscular disorder (HCC)      parkinson's tremors in hands   Palpitations      Benign PVCs   Parkinson disease (HCC)     Pneumonia     Prostate cancer (HCC)     RBBB (right bundle branch block)      rate related   Shingles     Stroke (HCC) 2017   TIA (transient ischemic attack) 02/2015    Per pt, had 2 strokes   Vertigo            Has the patient had major surgery during 100 days prior to admission? Yes   Family History   family history includes Breast cancer in his paternal grandmother; Clotting disorder in his father; Healthy in his  daughter and daughter; Heart attack in his father and paternal grandfather; Heart disease in his brother and father; Hypertension in his mother; Lung cancer in his brother; Prostate cancer in his brother.   Current Medications  Current Medications    Current Facility-Administered Medications:    acetaminophen  (TYLENOL ) tablet 325-650 mg, 325-650 mg, Oral, Q6H PRN, Cockerham, Alicia M, PA-C, 650 mg at 01/15/23 9178   albuterol  (PROVENTIL ) (2.5 MG/3ML) 0.083% nebulizer solution 2.5 mg, 2.5 mg, Inhalation, Q4H PRN, Cockerham, Alicia M, PA-C   aspirin  chewable tablet 81 mg, 81 mg, Oral, Daily, 81 mg at 01/15/23 0946 **OR** [DISCONTINUED] aspirin  suppository 300 mg, 300 mg, Rectal, Daily, Matthews Elida HERO, MD   azelastine  (ASTELIN ) 0.1 % nasal spray 1 spray, 1 spray, Each Nare, QHS, 1 spray at 01/14/23 2130 **AND** fluticasone  (FLONASE ) 50 MCG/ACT nasal spray 1 spray, 1 spray, Each Nare, QHS, Hammons, Kimberly B, RPH, 1 spray at 01/14/23 2130   carbidopa -levodopa  (SINEMET  CR) 50-200 MG per tablet controlled release 1 tablet, 1 tablet, Oral, QHS, Cockerham, Alicia M, PA-C, 1 tablet at 01/14/23 2127   carbidopa -levodopa  (SINEMET  IR) 25-100 MG per tablet immediate release 2 tablet, 2 tablet, Oral, 5 X Daily, Cockerham, Alicia M, PA-C, 2 tablet at 01/15/23 9053   diphenhydrAMINE  (BENADRYL ) 12.5 MG/5ML elixir 12.5-25 mg, 12.5-25 mg, Oral, Q4H PRN, Cockerham, Alicia M, PA-C   docusate sodium  (COLACE) capsule 100 mg, 100 mg, Oral, BID, Cockerham, Alicia M, PA-C, 100 mg at 01/15/23 9053   escitalopram  (LEXAPRO ) tablet 20 mg, 20 mg, Oral, Daily, Cockerham, Alicia M, PA-C, 20 mg at 01/15/23 9053   finasteride  (PROSCAR ) tablet 5 mg, 5 mg, Oral, QPM, Cockerham, Alicia M, PA-C, 5 mg at 01/14/23 1823   hydrocortisone  (ANUSOL -HC) 2.5 % rectal cream 1 Application, 1 Application, Rectal, BID PRN, Renae, Alicia M, PA-C   HYDROmorphone  (DILAUDID ) injection 0.5-1 mg, 0.5-1 mg, Intravenous, Q4H PRN, Cockerham, Alicia M,  PA-C, 1 mg at 01/13/23 2147   ketotifen  (ZADITOR ) 0.035 % ophthalmic solution 1 drop, 1 drop, Both Eyes, BID, Cockerham, Alicia M, PA-C, 1 drop at 01/15/23 [phone]  loratadine  (CLARITIN ) tablet 10 mg, 10 mg, Oral, QPM, Cockerham, Alicia M, PA-C, 10 mg at 01/14/23 1823   menthol -cetylpyridinium (CEPACOL) lozenge 3 mg, 1 lozenge, Oral, PRN **OR** phenol (CHLORASEPTIC) mouth spray 1 spray, 1 spray, Mouth/Throat, PRN, Cockerham, Alicia M, PA-C   methocarbamol  (ROBAXIN ) tablet 500 mg, 500 mg, Oral, Q6H PRN, 500 mg at 01/15/23 0820 **OR** methocarbamol  (ROBAXIN ) injection 500 mg, 500 mg, Intravenous, Q6H PRN, Cockerham, Alicia M, PA-C   metoprolol  succinate (TOPROL -XL) 24 hr tablet 12.5 mg, 12.5 mg, Oral, QHS, Cockerham, Alicia M, PA-C, 12.5 mg at 01/14/23 2126   mexiletine (MEXITIL ) capsule 150 mg, 150 mg, Oral, BID, Cockerham, Alicia M, PA-C, 150 mg at 01/15/23 9053   midodrine  (PROAMATINE ) tablet 5 mg, 5 mg, Oral, BID PRN, Renae, Alicia M, PA-C   montelukast  (SINGULAIR ) tablet 10 mg, 10 mg, Oral, QHS, Cockerham, Alicia M, PA-C, 10 mg at 01/14/23 2126   nitroGLYCERIN  (NITROSTAT ) SL tablet 0.4 mg, 0.4 mg, Sublingual, Q5 min PRN, Cockerham, Alicia M, PA-C   ondansetron  (ZOFRAN ) tablet 4 mg, 4 mg, Oral, Q6H PRN **OR** ondansetron  (ZOFRAN ) injection 4 mg, 4 mg, Intravenous, Q6H PRN, Renae, Alicia M, PA-C   Oral care mouth rinse, 15 mL, Mouth Rinse, PRN, Edna Toribio LABOR, MD   oxyCODONE  (Oxy IR/ROXICODONE ) immediate release tablet 5-10 mg, 5-10 mg, Oral, Q4H PRN, Cockerham, Alicia M, PA-C, 5 mg at 01/15/23 9047   pantoprazole  (PROTONIX ) EC tablet 40 mg, 40 mg, Oral, Daily, Cockerham, Alicia M, PA-C, 40 mg at 01/15/23 0947   polyethylene glycol (MIRALAX  / GLYCOLAX ) packet 17 g, 17 g, Oral, Daily PRN, Renae, Alicia M, PA-C   rOPINIRole  (REQUIP ) tablet 1 mg, 1 mg, Oral, TID, Cockerham, Alicia M, PA-C, 1 mg at 01/15/23 9053   rosuvastatin  (CRESTOR ) tablet 20 mg, 20 mg, Oral, QHS, Cockerham, Alicia  M, PA-C, 20 mg at 01/14/23 2127   tamsulosin  (FLOMAX ) capsule 0.4 mg, 0.4 mg, Oral, QPC supper, Cockerham, Alicia M, PA-C, 0.4 mg at 01/14/23 1823   ticagrelor  (BRILINTA ) tablet 90 mg, 90 mg, Oral, BID, Sethi, Pramod S, MD, 90 mg at 01/15/23 9053   zolpidem  (AMBIEN ) tablet 10 mg, 10 mg, Oral, QHS, Cockerham, Alicia M, PA-C, 10 mg at 01/14/23 2129     Patients Current Diet:  Diet Order                  Diet - low sodium heart healthy             Diet regular Room service appropriate? Yes; Fluid consistency: Thin  Diet effective now                         Precautions / Restrictions Precautions Precautions: Knee, Fall Precaution Booklet Issued: No Precaution Comments: Educated on keeping knee straight when at rest Restrictions Weight Bearing Restrictions Per Provider Order: Yes RLE Weight Bearing Per Provider Order: Weight bearing as tolerated    Has the patient had 2 or more falls or a fall with injury in the past year? No   Prior Activity Level Community (5-7x/wk): occasional cane vs RW depending on pain, indep, still driving PTA   Prior Functional Level Self Care: Did the patient need help bathing, dressing, using the toilet or eating? Independent   Indoor Mobility: Did the patient need assistance with walking from room to room (with or without device)? Independent   Stairs: Did the patient need assistance with internal or external stairs (with or without device)? Independent  Functional Cognition: Did the patient need help planning regular tasks such as shopping or remembering to take medications? Independent   Patient Information Are you of Hispanic, Latino/a,or Spanish origin?: A. No, not of Hispanic, Latino/a, or Spanish origin What is your race?: A. White Do you need or want an interpreter to communicate with a doctor or health care staff?: 0. No   Patient's Response To:  Health Literacy and Transportation Is the patient able to respond to health literacy and  transportation needs?: Yes Health Literacy - How often do you need to have someone help you when you read instructions, pamphlets, or other written material from your doctor or pharmacy?: Never In the past 12 months, has lack of transportation kept you from medical appointments or from getting medications?: No In the past 12 months, has lack of transportation kept you from meetings, work, or from getting things needed for daily living?: No   Home Assistive Devices / Equipment Home Equipment: Agricultural Consultant (2 wheels), Shower seat, Grab bars - tub/shower   Prior Device Use: Indicate devices/aids used by the patient prior to current illness, exacerbation or injury? Walker and cane   Current Functional Level Cognition   Arousal/Alertness: Awake/alert Overall Cognitive Status: Difficult to assess Difficult to assess due to: Impaired communication Orientation Level: Oriented X4 General Comments: Follows simple cues with extra time, needing repeated cues to attend to his R side. Attention: Sustained Sustained Attention: Appears intact Memory:  (to be assessed) Awareness: Impaired Awareness Impairment: Emergent impairment Problem Solving:  (will assess) Safety/Judgment: Impaired    Extremity Assessment (includes Sensation/Coordination)   Upper Extremity Assessment: Right hand dominant RUE Deficits / Details: weaker distally vs proximally, 2-/5 grasp, cannot hold onto RW, able to form weak fist, can elevate shoulder 90*, can bring hand to mouth with incr time, unable to coordinate wrist flex/ext movement. reports hand feels numb RUE Sensation: decreased proprioception, decreased light touch RUE Coordination: decreased fine motor, decreased gross motor  Lower Extremity Assessment: Defer to PT evaluation RLE Deficits / Details: post-op TKA; able to flex to 90 in sitting; able to SLR     ADLs   Overall ADL's : Needs assistance/impaired Eating/Feeding: Minimal assistance Grooming: Minimal  assistance Upper Body Bathing: Moderate assistance Lower Body Bathing: Moderate assistance Upper Body Dressing : Moderate assistance Lower Body Dressing: Moderate assistance Toilet Transfer: Minimal assistance, Ambulation, Rolling walker (2 wheels) Toileting- Clothing Manipulation and Hygiene: Minimal assistance Functional mobility during ADLs: Minimal assistance, Rolling walker (2 wheels)     Mobility   Overal bed mobility: Needs Assistance Bed Mobility: Supine to Sit, Sit to Supine Supine to sit: Contact guard, HOB elevated Sit to supine: Min assist, HOB elevated General bed mobility comments: Verbal and tactile cues provided for pt to look at his R extremities to manage his R leg off EOB and push through his R UE to ascend his trunk to sit up, extra time needed, CGA for safety.     Transfers   Overall transfer level: Needs assistance Equipment used: Rolling walker (2 wheels) Transfers: Sit to/from Stand Sit to Stand: Mod assist, Contact guard assist, Min assist General transfer comment: Verbal cues provided for pt to place R hand on RW and L hand on bed/chair to push up to stand. mod A the first rep from EOB, fading to min A to CGA with task practicve from chair x3     Ambulation / Gait / Stairs / Wheelchair Mobility   Ambulation/Gait Ambulation/Gait assistance: Editor, Commissioning (Feet):  130 Feet Assistive device: Rolling walker (2 wheels) Gait Pattern/deviations: Decreased stride length, Step-through pattern, Decreased step length - right, Decreased step length - left, Decreased dorsiflexion - right, Trunk flexed, Knee flexed in stance - right General Gait Details: Verbal cues and tactile cues provided provided at R quads during stance to promote R knee extension. Good success noted. MinA for balance. Cues provided also for upright posture and wider BOS, fair momentary success noted Gait velocity: slowed Gait velocity interpretation: <1.31 ft/sec, indicative of household  ambulator     Posture / Balance Dynamic Sitting Balance Sitting balance - Comments: Able to sit EOB with supervision for safety Balance Overall balance assessment: Needs assistance Sitting-balance support: Feet supported, No upper extremity supported Sitting balance-Leahy Scale: Fair Sitting balance - Comments: Able to sit EOB with supervision for safety Postural control: Right lateral lean Standing balance support: Bilateral upper extremity supported Standing balance-Leahy Scale: Poor Standing balance comment: Reliant on RW     Special needs/care consideration CPM  and Skin R knee surgical incision    Previous Home Environment (from acute therapy documentation) Living Arrangements: Spouse/significant other  Lives With: Spouse Available Help at Discharge: Family, Available 24 hours/day Type of Home: House Home Layout: One level Home Access: Level entry Bathroom Shower/Tub: Health Visitor: Handicapped height Bathroom Accessibility: Yes   Discharge Living Setting Plans for Discharge Living Setting: Patient's home, Lives with (comment) (spouse) Type of Home at Discharge: House Discharge Home Layout: One level Discharge Home Access: Level entry Discharge Bathroom Shower/Tub: Tub/shower unit, Walk-in shower Discharge Bathroom Toilet: Handicapped height Discharge Bathroom Accessibility: Yes How Accessible: Accessible via walker Does the patient have any problems obtaining your medications?: No   Social/Family/Support Systems Patient Roles: Spouse Anticipated Caregiver: spouse, [name], and daughters [place] and [place] Anticipated Caregiver's Contact Information: [name] [phone]; comer [phone]; Isaiah [phone] Ability/Limitations of Caregiver: supervision for mobility, min assist for ADLs Caregiver Availability: 24/7 Discharge Plan Discussed with Primary Caregiver: Yes Is Caregiver In Agreement with Plan?: Yes Does Caregiver/Family have Issues  with Lodging/Transportation while Pt is in Rehab?: No   Goals Patient/Family Goal for Rehab: PT/OT supervision, SLP supervision to min assist Expected length of stay: 12-14 days Additional Information: Discharge plan: home with spouse as primary caregiver 24/7, also has supportive daughters who can assist PRN Pt/Family Agrees to Admission and willing to participate: Yes Program Orientation Provided & Reviewed with Pt/Caregiver Including Roles  & Responsibilities: Yes   Decrease burden of Care through IP rehab admission: n/a   Possible need for SNF placement upon discharge: Not anticipated.  Plan for d/c home with spouse as primary caregiver 24/7 and daughters available to help PRN.    Patient Condition: I have reviewed medical records from [facility], spoken with CM, and patient, spouse, and daughter. I met with patient at the bedside for inpatient rehabilitation assessment.  Patient will benefit from ongoing PT, OT, and SLP, can actively participate in 3 hours of therapy a day 5 days of the week, and can make measurable gains during the admission.  Patient will also benefit from the coordinated team approach during an Inpatient Acute Rehabilitation admission.  The patient will receive intensive therapy as well as Rehabilitation physician, nursing, social worker, and care management interventions.  Due to bladder management, safety, skin/wound care, medication administration, pain management, and patient education the patient requires 24 hour a day rehabilitation nursing.  The patient is currently min assist with mobility and basic ADLs.  Discharge setting and therapy post discharge at  home with home health is anticipated.  Patient has agreed to participate in the Acute Inpatient Rehabilitation Program and will admit today.   Preadmission Screen Completed By:  Reche FORBES Lowers, PT, DPT 01/15/2023 10:44 AM ______________________________________________________________________   Discussed status with  Dr. Babs on 01/15/23  at 10:44 AM  and received approval for admission today.   Admission Coordinator:  Aaryn Parrilla E Latyra Jaye, PT, time 10:44 AM Pattricia 01/15/23      Assessment/Plan: Diagnosis: embolic left frontal infarct after right TKA Does the need for close, 24 hr/day Medical supervision in concert with the patient's rehab needs make it unreasonable for this patient to be served in a less intensive setting? Yes Co-Morbidities requiring supervision/potential complications: HTN, MI, PD, prior cva Due to bladder management, bowel management, safety, skin/wound care, disease management, medication administration, pain management, and patient education, does the patient require 24 hr/day rehab nursing? Yes Does the patient require coordinated care of a physician, rehab nurse, PT, OT, and SLP to address physical and functional deficits in the context of the above medical diagnosis(es)? Yes Addressing deficits in the following areas: balance, endurance, locomotion, strength, transferring, bowel/bladder control, bathing, dressing, feeding, grooming, toileting, cognition, speech, swallowing, and psychosocial support Can the patient actively participate in an intensive therapy program of at least 3 hrs of therapy 5 days a week? Yes The potential for patient to make measurable gains while on inpatient rehab is excellent Anticipated functional outcomes upon discharge from inpatient rehab: supervision PT, supervision OT, supervision and min assist SLP Estimated rehab length of stay to reach the above functional goals is: 12-14 days Anticipated discharge destination: Home 10. Overall Rehab/Functional Prognosis: excellent     MD Signature: Arthea IVAR Babs, MD, [facility] Physical Medicine & Rehabilitation Medical Director Rehabilitation Services 01/15/2023          Revision History

## 2023-01-15 NOTE — Progress Notes (Signed)
 Inpatient Rehabilitation Admission Medication Review by a Pharmacist  A complete drug regimen review was completed for this patient to identify any potential clinically significant medication issues.  High Risk Drug Classes Is patient taking? Indication by Medication  Antipsychotic No   Anticoagulant No   Antibiotic No   Opioid Yes Oxycodone : pain  Antiplatelet Yes Aspirin , Brilinta  x 30 days, then aspirin  alone starting 02/12/23: CVA prophylaxis   Hypoglycemics/insulin No   Vasoactive Medication Yes Toprol , Mexiletine, midodrine , Flomax : HTN, BPH, NSTEMI, CAD, PVCs, intermittent hypotension  Chemotherapy No   Other Yes Fleet, Miralax , docusate, bisacodyl : constipation Maalox: indigestion Pantoprazole : reflux  Robitussin: cough  Zofran : n/v  Robaxin : muscle spasms   Azelastine , Flonase , claritin , singulair : allergies, rhinitis, asthma Sinemet , Ropinirole : parkinson's Disease Lexapro : mood Finasteride : BPH Ketotifen : eye care  Protonix : reflux, Barrett's  Crestor : HLD Ambien : sleep      Type of Medication Issue Identified Description of Issue Recommendation(s)  Drug Interaction(s) (clinically significant)     Duplicate Therapy     Allergy     No Medication Administration End Date     Incorrect Dose     Additional Drug Therapy Needed     Significant med changes from prior encounter (inform family/care partners about these prior to discharge).  Communicate relevant medication changes to patient/family members at discharge from CIR.   Restart or discontinue PTA meds not resumed in CIR at discharge if clinically indicated.   Other       Clinically significant medication issues were identified that warrant physician communication and completion of prescribed/recommended actions by midnight of the next day:  No  Time spent performing this drug regimen review (minutes):  45   Thank you for allowing pharmacy to be a part of this patient's care.   Adra Shepler C Rafeal Skibicki,  PharmD 01/15/2023 1:19 PM  **Pharmacist phone directory can be found on [email or web address] listed under [facility]**

## 2023-01-15 NOTE — TOC Transition Note (Signed)
 Transition of Care [facility]) - Discharge Note   Patient Details  Name: Joshua [name] MRN: [id number] Date of Birth: 03/13/1945  [facility] CM/SW Contact:  Andrez JULIANNA George, RN Phone Number: 01/15/2023, 10:17 AM   Clinical Narrative:     Pt is discharging to CIR today. No further needs per TOC.   Final next level of care: IP Rehab Facility Barriers to Discharge: No Barriers Identified   Patient Goals and CMS Choice   CMS Medicare.gov Compare Post Acute Care list provided to:: Patient Choice offered to / list presented to : Patient      Discharge Placement                       Discharge Plan and Services Additional resources added to the After Visit Summary for     Discharge Planning Services: CM Consult Post Acute Care Choice: IP Rehab                               Social Drivers of Health (SDOH) Interventions SDOH Screenings   Food Insecurity: Patient Unable To Answer (01/11/2023)  Housing: Patient Unable To Answer (01/11/2023)  Transportation Needs: Patient Unable To Answer (01/11/2023)  Utilities: Not At Risk (09/28/2022)  Depression (PHQ2-9): Low Risk  (04/15/2021)  Social Connections: Unknown (01/11/2023)  Tobacco Use: Low Risk  (01/11/2023)     Readmission Risk Interventions     No data to display

## 2023-01-15 NOTE — Progress Notes (Signed)
     Subjective: Patient reports pain as mild.  Hand function improving, now able to make fist and extend fingers without difficulty. Speech also slowly improving. Tolerating CPM well, making good progress with PT. Plan for discharge to [facility], awaiting insurance autorization.   Objective:   VITALS:   Vitals:   01/14/23 1611 01/14/23 1937 01/14/23 2345 01/15/23 0447  BP: 131/77 116/74 121/70 113/74  Pulse: 75 78 72 72  Resp:  18 16 18   Temp: 98.5 F (36.9 C) (!) 97.5 F (36.4 C) 98.2 F (36.8 C) 98.1 F (36.7 C)  TempSrc: Oral Axillary Axillary Axillary  SpO2: 100% 99% 96% 95%  Weight:      Height:        Sensation intact distally Intact pulses distally Dorsiflexion/Plantar flexion intact Incision: dressing C/D/I Compartment soft    Lab Results  Component Value Date   WBC 8.3 01/14/2023   HGB 12.0 (L) 01/14/2023   HCT 36.2 (L) 01/14/2023   MCV 91.4 01/14/2023   PLT 197 01/14/2023   BMET    Component Value Date/Time   NA 135 01/13/2023 0022   NA 136 02/14/2021 1528   K 4.1 01/13/2023 0022   CL 105 01/13/2023 0022   CO2 23 01/13/2023 0022   GLUCOSE 112 (H) 01/13/2023 0022   BUN 24 (H) 01/13/2023 0022   BUN 20 02/14/2021 1528   CREATININE 0.74 01/13/2023 0022   CREATININE 0.88 09/18/2015 1135   CALCIUM  8.5 (L) 01/13/2023 0022   EGFR 73 02/14/2021 1528   GFRNONAA >60 01/13/2023 0022    Xray: Total knee arthroplasty components in good position no adverse features  Assessment/Plan: 4 Days Post-Op   Principal Problem:   Primary osteoarthritis of right knee Active Problems:   Localized osteoarthritis of right knee  S/p R TKA 12/30 post op course complicated by stroke  Neurology following, recs appreciated  Post op recs: WB: WBAT Abx: ancef  Imaging: PACU xrays DVT prophylaxis: Aspirin  and Brilinta  per neurology team Follow up: 2 weeks after surgery for a wound check with Dr. Edna at [facility].  Address: [address], [place], [place] [address]  Office Phone: [phone]   TORIBIO DELENA EDNA 01/15/2023, 7:46 AM   Toribio Edna, MD  Contact information:   [phone] 7am-5pm epic message Dr. Edna, or call office for patient follow up: [phone] After hours and holidays please check [email or web address] for group call information for [facility]

## 2023-01-15 NOTE — H&P (Signed)
 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 Edna at [facility]. He developed acute onset of right sided weakness and aphasia after surgery in PACU. 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 He was taking Plavix  prior to admission and was started on aspirin  and Brilinta  for 4 weeks followed by aspirin  alone. 2-d echo with EF ~50-55%. A1c = 5.9%. BLE venous duplex negative for DVT. Patient will need 30-day heart monitor at discharge to look for paroxysmal A-fib. Symptoms somewhat improved and he is tolerating CPM well. He was able to ambulate an increased distance of up to ~74 ft with a RW and minA 01/02. 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   Review of Systems  Constitutional:  Negative for chills and fever.  HENT:  Negative for sore throat.   Respiratory:  Negative for cough and shortness of breath.   Cardiovascular:  Negative for chest pain and palpitations.  Gastrointestinal:  Negative for nausea and vomiting.  Genitourinary:  Negative for frequency and urgency.  Neurological:  Negative for dizziness and headaches.  Psychiatric/Behavioral:  The patient is not nervous/anxious and does not have insomnia.         Past Medical History:  Diagnosis Date   Allergic rhinitis     Anxiety      from chronic pain from surgery- on Cymbalta    Arthritis     Asthma     Barrett's esophagus 03/29/2014   Carotid artery disease  (HCC)      Carotid Doppler normal August, 2007   Coronary artery disease     Diverticulosis     Dyslipidemia     GERD (gastroesophageal reflux disease)     History of loop recorder      has since 04/04/15   HTN (hypertension)      takes Metoprolol  for PVC control   Hx of colonic polyps      adenomatous   IFG (impaired fasting glucose)     Myocardial infarction (HCC)      mild   Neuromuscular disorder (HCC)      parkinson's tremors in hands   Palpitations      Benign PVCs   Parkinson disease (HCC)     Pneumonia     Prostate cancer (HCC)     RBBB (right bundle branch block)      rate related   Shingles     Stroke (HCC) 2017   TIA (transient ischemic attack) 02/2015    Per pt, had 2 strokes   Vertigo               Past Surgical History:  Procedure Laterality Date   BACK SURGERY   2002,2009    x 6   CHOLECYSTECTOMY   11/30/2012    with IOC   COLONOSCOPY       ELECTROPHYSIOLOGIC STUDY N/A 09/26/2015    Procedure: V Tach Ablation (PVC);  Surgeon: Will Gladis Norton, MD;  Location: [facility];  Service: Cardiovascular;  Laterality: N/A;  EP IMPLANTABLE DEVICE N/A 04/04/2015    Procedure: Loop Recorder Insertion;  Surgeon: Will Gladis Norton, MD;  Location: [facility];  Service: Cardiovascular;  Laterality: N/A;   HERNIA REPAIR        laprascopic   KNEE SURGERY        DECEMBER 2017,LEFT KNEE SCOPED   LEFT HEART CATH AND CORONARY ANGIOGRAPHY N/A 02/05/2021    Procedure: LEFT HEART CATH AND CORONARY ANGIOGRAPHY;  Surgeon: Darron Deatrice LABOR, MD;  Location: [facility];  Service: Cardiovascular;  Laterality: N/A;   NECK SURGERY   2002   POLYPECTOMY       ROTATOR CUFF REPAIR Left     TEE WITHOUT CARDIOVERSION N/A 04/04/2015    Procedure: TRANSESOPHAGEAL ECHOCARDIOGRAM (TEE);  Surgeon: Redell GORMAN Shallow, MD;  Location: [facility];  Service: Cardiovascular;  Laterality: N/A;   TOTAL KNEE ARTHROPLASTY Left 09/28/2022    Procedure: TOTAL KNEE  ARTHROPLASTY;  Surgeon: Edna Toribio LABOR, MD;  Location: [facility];  Service: Orthopedics;  Laterality: Left;   TOTAL KNEE ARTHROPLASTY Right 01/11/2023    Procedure: TOTAL KNEE ARTHROPLASTY;  Surgeon: Edna Toribio LABOR, MD;  Location: [facility];  Service: Orthopedics;  Laterality: Right;   TRIGGER FINGER RELEASE Left 12/28/2019    Procedure: RELEASE TRIGGER FINGER/A-1 PULLEY THUMB, MIDDLE AND RING;  Surgeon: Murrell Kuba, MD;  Location: [facility];  Service: Orthopedics;  Laterality: Left;  FAB   UPPER GASTROINTESTINAL ENDOSCOPY       V Tach ablation   09/26/2015             Family History  Problem Relation Age of Onset   Hypertension Mother     Heart attack Father     Heart disease Father     Clotting disorder Father     Heart disease Brother     Lung cancer Brother     Prostate cancer Brother     Breast cancer Paternal Grandmother     Heart attack Paternal Grandfather     Healthy Daughter     Healthy Daughter     Esophageal cancer Neg Hx     Stomach cancer Neg Hx     Rectal cancer Neg Hx     Colon polyps Neg Hx          Social History:  reports that he has never smoked. He has never used smokeless tobacco. He reports that he does not drink alcohol  and does not use drugs. Allergies:  Allergies       Allergies  Allergen Reactions   Floxin I.V. In Dextrose  5% [Ofloxacin] Other (See Comments)      Lowers BP     Terazosin Other (See Comments)      Lower bp            Medications Prior to Admission  Medication Sig Dispense Refill   acetaminophen  (TYLENOL ) 650 MG CR tablet Take 1,300 mg by mouth in the morning and at bedtime.       albuterol  (VENTOLIN  HFA) 108 (90 Base) MCG/ACT inhaler Inhale 2 puffs into the lungs every 4 (four) hours as needed for wheezing or shortness of breath.       azelastine  (OPTIVAR ) 0.05 % ophthalmic solution Place 1 drop into both eyes 2 (two) times daily.       Biotin 89999 MCG TABS Take 1,000 mcg by mouth in the morning.        carbidopa -levodopa  (SINEMET  CR) 50-200 MG tablet TAKE 1 TABLET BY  MOUTH EVERYDAY AT BEDTIME 90 tablet 0   carbidopa -levodopa  (SINEMET  IR) 25-100 MG tablet 2 TABLETS AT 7 AM, 2 TABLETS AT 10 AM,2 TABLETS AT 1PM,2 TABLETS AT 4 PM /2 TABLETS AT 7 PM 900 tablet 0   cholecalciferol  (VITAMIN D3) 25 MCG (1000 UNIT) tablet Take 1,000 Units by mouth daily.       cyanocobalamin (VITAMIN B12) 1000 MCG tablet Take 1,000 mcg by mouth daily.       DYMISTA  137-50 MCG/ACT SUSP Place 1 puff into both nostrils at bedtime.        escitalopram  (LEXAPRO ) 20 MG tablet Take 20 mg by mouth daily.       finasteride  (PROSCAR ) 5 MG tablet Take 5 mg by mouth every evening.       lansoprazole  (PREVACID ) 30 MG capsule Take 30 mg by mouth 2 (two) times daily. Morning & mid-afternoon       levocetirizine (XYZAL ) 5 MG tablet Take 5 mg by mouth every evening.         Menthol , Topical Analgesic, (BIOFREEZE EX) Apply 1 Application topically daily as needed (pain).       metoprolol  succinate (TOPROL -XL) 25 MG 24 hr tablet TAKE 1/2 TABLET BY MOUTH DAILY (Patient taking differently: Take 12.5 mg by mouth at bedtime.) 45 tablet 2   mexiletine (MEXITIL ) 150 MG capsule TAKE 1 CAPSULE BY MOUTH TWICE A DAY 180 capsule 3   midodrine  (PROAMATINE ) 5 MG tablet TAKE 1 TABLET BY MOUTH 2 TIMES DAILY AS NEEDED. TAKE IF SYSTOLIC BLOOD PRESSURE GETS BELOW 100 (Patient taking differently: Take 5 mg by mouth See admin instructions. Take 5 mg daily, may take a second 5 mg dose as needed for low bp) 180 tablet 3   montelukast  (SINGULAIR ) 10 MG tablet Take 10 mg by mouth at bedtime.         Omega-3 Fatty Acids  (FISH OIL) 1000 MG CAPS Take 1,000 mg by mouth in the morning.       polyethylene glycol (MIRALAX ) 17 g packet Take 17 g by mouth daily. (Patient taking differently: Take 17 g by mouth daily as needed for moderate constipation.) 14 each 0   PROCTOSOL HC 2.5 % rectal cream APPLY INTO AND AROUND RECTUM 2 TIMES A DAY (Patient taking differently: Place 1  Application rectally 2 (two) times daily as needed for hemorrhoids or anal itching.) 28.35 g 0   rOPINIRole  (REQUIP ) 1 MG tablet TAKE 1 TABLET BY MOUTH 3 TIMES DAILY. 270 tablet 0   rosuvastatin  (CRESTOR ) 20 MG tablet Take 1 tablet (20 mg total) by mouth at bedtime. 90 tablet 0   tamsulosin  (FLOMAX ) 0.4 MG CAPS capsule Take 0.4 mg by mouth daily after supper.        zolpidem  (AMBIEN ) 10 MG tablet Take 10 mg by mouth at bedtime.       [DISCONTINUED] clopidogrel  (PLAVIX ) 75 MG tablet Take 1 tablet (75 mg total) by mouth daily. 30 tablet 2   nitroGLYCERIN  (NITROSTAT ) 0.4 MG SL tablet Place 1 tablet (0.4 mg total) under the tongue every 5 (five) minutes as needed for chest pain (Do not give more than 3 SL tablets in 15 minutes.). 25 tablet 2              Home: Home Living Family/patient expects to be discharged to:: Private residence Living Arrangements: Spouse/significant other Available Help at Discharge: Family, Available 24 hours/day Type of Home: House Home Access: Level entry Home Layout: One level Bathroom Shower/Tub: Health Visitor: Handicapped  height Bathroom Accessibility: Yes Home Equipment: Agricultural Consultant (2 wheels), Shower seat, Grab bars - tub/shower  Lives With: Spouse   Functional History: Prior Function Prior Level of Function : Independent/Modified Independent Mobility Comments: cane PRN ADLs Comments: not driving   Functional Status:  Mobility: Bed Mobility Overal bed mobility: Needs Assistance Bed Mobility: Supine to Sit, Sit to Supine Supine to sit: Contact guard, HOB elevated Sit to supine: Min assist, HOB elevated General bed mobility comments: Verbal and tactile cues provided for pt to look at his R extremities to manage his R leg off EOB and push through his R UE to ascend his trunk to sit up, extra time needed, CGA for safety. Transfers Overall transfer level: Needs assistance Equipment used: Rolling walker (2 wheels) Transfers: Sit  to/from Stand Sit to Stand: Mod assist, Contact guard assist, Min assist General transfer comment: Verbal cues provided for pt to place R hand on RW and L hand on bed/chair to push up to stand. mod A the first rep from EOB, fading to min A to CGA with task practicve from chair x3 Ambulation/Gait Ambulation/Gait assistance: Min assist Gait Distance (Feet): 130 Feet Assistive device: Rolling walker (2 wheels) Gait Pattern/deviations: Decreased stride length, Step-through pattern, Decreased step length - right, Decreased step length - left, Decreased dorsiflexion - right, Trunk flexed, Knee flexed in stance - right General Gait Details: Verbal cues and tactile cues provided provided at R quads during stance to promote R knee extension. Good success noted. MinA for balance. Cues provided also for upright posture and wider BOS, fair momentary success noted Gait velocity: slowed Gait velocity interpretation: <1.31 ft/sec, indicative of household ambulator   ADL: ADL Overall ADL's : Needs assistance/impaired Eating/Feeding: Minimal assistance Grooming: Minimal assistance Upper Body Bathing: Moderate assistance Lower Body Bathing: Moderate assistance Upper Body Dressing : Moderate assistance Lower Body Dressing: Moderate assistance Toilet Transfer: Minimal assistance, Ambulation, Rolling walker (2 wheels) Toileting- Clothing Manipulation and Hygiene: Minimal assistance Functional mobility during ADLs: Minimal assistance, Rolling walker (2 wheels)   Cognition: Cognition Overall Cognitive Status: Difficult to assess Arousal/Alertness: Awake/alert Orientation Level: Oriented X4 Attention: Sustained Sustained Attention: Appears intact Memory:  (to be assessed) Awareness: Impaired Awareness Impairment: Emergent impairment Problem Solving:  (will assess) Safety/Judgment: Impaired Cognition Arousal: Alert Behavior During Therapy: WFL for tasks assessed/performed Overall Cognitive Status:  Difficult to assess General Comments: Follows simple cues with extra time, needing repeated cues to attend to his R side. Difficult to assess due to: Impaired communication   Physical Exam: Blood pressure 113/74, pulse 72, temperature 98.1 F (36.7 C), temperature source Axillary, resp. rate 18, height 6' 1 (1.854 m), weight 103.9 kg, SpO2 95%. Physical Exam Constitutional:      General: He is not in acute distress. HENT:     Head: Normocephalic and atraumatic.     Right Ear: External ear normal.     Left Ear: External ear normal.     Nose: Nose normal.  Eyes:     Extraocular Movements: Extraocular movements intact.     Pupils: Pupils are equal, round, and reactive to light.  Cardiovascular:     Rate and Rhythm: Normal rate and regular rhythm.     Heart sounds: No murmur heard.    No gallop.  Pulmonary:     Effort: Pulmonary effort is normal. No respiratory distress.     Breath sounds: No wheezing or rales.  Abdominal:     General: Bowel sounds are normal. There is no distension.  Palpations: Abdomen is soft.  Musculoskeletal:        General: Normal range of motion.     Cervical back: Normal range of motion.     Comments: Right TKA with swelling, about 30-40 degrees of flexion. Left TKA well healed with at least 110 degrees flexion and full extension.   Skin:    Comments: Right knee with immediate postop dressing in place with scant blood. Left knee well healed.   Neurological:     Mental Status: He is alert and oriented to person, place, and time.     Comments: Pt is alert, oriented to person, place, month/year. Needed some help with day. Language is non-fluent but with extra time he is able to communicate needs with words and short sentences. Able to ID several objects in room with minimal pause. CN exam was non-focal. Mild right hemiparesis RUE: 4+/5, RLE 2+ prox to-4/5 (confounded by RTKA). LUE and LLE  4 to 5/5. No focal sensory loss. Cog wheeling rigidity with tremor  noted in both RUE and RLE. Cog wheeling and tremor less on left side. Normal resting tone. DTR's trace to 1+  Psychiatric:        Mood and Affect: Mood normal.        Behavior: Behavior normal.        Lab Results Last 48 Hours        Results for orders placed or performed during the hospital encounter of 01/11/23 (from the past 48 hours)  CBC     Status: Abnormal    Collection Time: 01/14/23  7:04 AM  Result Value Ref Range    WBC 8.3 4.0 - 10.5 K/uL    RBC 3.96 (L) 4.22 - 5.81 MIL/uL    Hemoglobin 12.0 (L) 13.0 - 17.0 g/dL    HCT 63.7 (L) 60.9 - 52.0 %    MCV 91.4 80.0 - 100.0 fL    MCH 30.3 26.0 - 34.0 pg    MCHC 33.1 30.0 - 36.0 g/dL    RDW 86.2 88.4 - 84.4 %    Platelets 197 150 - 400 K/uL    nRBC 0.0 0.0 - 0.2 %      Comment: Performed at [facility], [address], [place], [place] [address]       Imaging Results (Last 48 hours)  VAS US  LOWER EXTREMITY VENOUS (DVT) Result Date: 01/13/2023  Lower Venous DVT Study Patient Name:  KONG PACKETT  Date of Exam:   01/13/2023 Medical Rec #: [id number]             Accession #:    [id number] Date of Birth: Jul 22, 1945            Patient Gender: M Patient Age:   52 years Exam Location:  [facility] Procedure:      VAS US  LOWER EXTREMITY VENOUS (DVT) Referring Phys: DANIEL MARCHWIANY --------------------------------------------------------------------------------  Indications: Swelling, and stroke.  Risk Factors: Right total knee replacement 01/11/23, upon awakening from anesthesia, weakness and aphasia noted. Comparison Study: No prior study on file Performing Technologist: Alberta Lis RVS  Examination Guidelines: A complete evaluation includes B-mode imaging, spectral Doppler, color Doppler, and power Doppler as needed of all accessible portions of each vessel. Bilateral testing is considered an integral part of a complete examination. Limited examinations for reoccurring indications may be performed as noted. The reflux  portion of the exam is performed with the patient in reverse Trendelenburg.  +---------+---------------+---------+-----------+----------+--------------+ RIGHT    CompressibilityPhasicitySpontaneityPropertiesThrombus Aging +---------+---------------+---------+-----------+----------+--------------+  CFV      Full           Yes      Yes                                 +---------+---------------+---------+-----------+----------+--------------+ SFJ      Full                                                        +---------+---------------+---------+-----------+----------+--------------+ FV Prox  Full                                                        +---------+---------------+---------+-----------+----------+--------------+ FV Mid   Full                                                        +---------+---------------+---------+-----------+----------+--------------+ FV DistalFull                                                        +---------+---------------+---------+-----------+----------+--------------+ PFV      Full                                                        +---------+---------------+---------+-----------+----------+--------------+ POP      Full           Yes      Yes                                 +---------+---------------+---------+-----------+----------+--------------+ PTV      Full                                                        +---------+---------------+---------+-----------+----------+--------------+ PERO     Full                                                        +---------+---------------+---------+-----------+----------+--------------+ Gastroc  Full                                                        +---------+---------------+---------+-----------+----------+--------------+   +---------+---------------+---------+-----------+----------+--------------+  LEFT      CompressibilityPhasicitySpontaneityPropertiesThrombus Aging +---------+---------------+---------+-----------+----------+--------------+ CFV      Full           Yes      Yes                                 +---------+---------------+---------+-----------+----------+--------------+ SFJ      Full                                                        +---------+---------------+---------+-----------+----------+--------------+ FV Prox  Full                                                        +---------+---------------+---------+-----------+----------+--------------+ FV Mid   Full                                                        +---------+---------------+---------+-----------+----------+--------------+ FV DistalFull                                                        +---------+---------------+---------+-----------+----------+--------------+ PFV      Full                                                        +---------+---------------+---------+-----------+----------+--------------+ POP      Full           Yes      Yes                                 +---------+---------------+---------+-----------+----------+--------------+ PTV      Full                                                        +---------+---------------+---------+-----------+----------+--------------+ PERO     Full                                                        +---------+---------------+---------+-----------+----------+--------------+     Summary: RIGHT: - There is no evidence of deep vein thrombosis in the lower extremity.  - No cystic structure found in the popliteal fossa.  LEFT: - There is no evidence of deep vein thrombosis in the lower extremity.  - No  cystic structure found in the popliteal fossa.  *See table(s) above for measurements and observations. Electronically signed by Penne Colorado MD on 01/13/2023 at 5:34:36 PM.    Final     ECHOCARDIOGRAM  COMPLETE Result Date: 01/13/2023    ECHOCARDIOGRAM REPORT   Patient Name:   PHARELL ROLFSON Date of Exam: 01/13/2023 Medical Rec #:  [id number]            Height:       73.0 in Accession #:    [id number]           Weight:       229.0 lb Date of Birth:  25-May-1945           BSA:          2.279 m Patient Age:    77 years             BP:           113/77 mmHg Patient Gender: M                    HR:           76 bpm. Exam Location:  Inpatient Procedure: 2D Echo, Cardiac Doppler and Color Doppler Indications:    Stroke  History:        Patient has prior history of Echocardiogram examinations, most                 recent 05/27/2022. CAD, Stroke; Risk Factors:Hypertension.  Sonographer:    Jayson Gaskins Referring Phys: [id number] ROCKY JAYSON LIKES IMPRESSIONS  1. Left ventricular ejection fraction, by estimation, is 50 to 55%. The left ventricle has low normal function. Left ventricular endocardial border not optimally defined to evaluate regional wall motion. There is mild left ventricular hypertrophy. Left ventricular diastolic parameters are indeterminate.  2. RV not well visualized. Grossly appears normal in size and function. . Right ventricular systolic function was not well visualized. The right ventricular size is not well visualized.  3. The mitral valve is normal in structure. No evidence of mitral valve regurgitation. No evidence of mitral stenosis.  4. The aortic valve has an indeterminant number of cusps. There is mild calcification of the aortic valve. There is mild thickening of the aortic valve. Aortic valve regurgitation is not visualized. No aortic stenosis is present.  5. The inferior vena cava is normal in size with greater than 50% respiratory variability, suggesting right atrial pressure of 3 mmHg. FINDINGS  Left Ventricle: Left ventricular ejection fraction, by estimation, is 50 to 55%. The left ventricle has low normal function. Left ventricular endocardial border not optimally defined to evaluate  regional wall motion. The left ventricular internal cavity  size was normal in size. There is mild left ventricular hypertrophy. Left ventricular diastolic parameters are indeterminate. Right Ventricle: RV not well visualized. Grossly appears normal in size and function. The right ventricular size is not well visualized. Right vetricular wall thickness was not well visualized. Right ventricular systolic function was not well visualized. Left Atrium: Left atrial size was normal in size. Right Atrium: Right atrial size was normal in size. Pericardium: There is no evidence of pericardial effusion. Mitral Valve: The mitral valve is normal in structure. No evidence of mitral valve regurgitation. No evidence of mitral valve stenosis. Tricuspid Valve: The tricuspid valve is normal in structure. Tricuspid valve regurgitation is not demonstrated. No evidence of tricuspid stenosis. Aortic Valve: The aortic valve has an indeterminant number of cusps. There is  mild calcification of the aortic valve. There is mild thickening of the aortic valve. There is mild aortic valve annular calcification. Aortic valve regurgitation is not visualized. No aortic stenosis is present. Aortic valve mean gradient measures 5.0 mmHg. Aortic valve peak gradient measures 8.3 mmHg. Aortic valve area, by VTI measures 3.09 cm. Pulmonic Valve: The pulmonic valve was not well visualized. Pulmonic valve regurgitation is not visualized. No evidence of pulmonic stenosis. Aorta: The aortic root is normal in size and structure. Venous: The inferior vena cava is normal in size with greater than 50% respiratory variability, suggesting right atrial pressure of 3 mmHg. IAS/Shunts: No atrial level shunt detected by color flow Doppler.  LEFT VENTRICLE PLAX 2D LVIDd:         4.60 cm   Diastology LVIDs:         3.20 cm   LV e' medial:    5.98 cm/s LV PW:         1.20 cm   LV E/e' medial:  13.4 LV IVS:        1.20 cm   LV e' lateral:   9.79 cm/s LVOT diam:     1.90  cm   LV E/e' lateral: 8.2 LV SV:         78 LV SV Index:   34 LVOT Area:     2.84 cm  RIGHT VENTRICLE RV S prime:     11.20 cm/s TAPSE (M-mode): 2.2 cm LEFT ATRIUM           Index        RIGHT ATRIUM           Index LA Vol (A2C): 36.5 ml 16.02 ml/m  RA Area:     10.50 cm LA Vol (A4C): 43.1 ml 18.91 ml/m  RA Volume:   19.60 ml  8.60 ml/m  AORTIC VALVE AV Area (Vmax):    2.46 cm AV Area (Vmean):   2.47 cm AV Area (VTI):     3.09 cm AV Vmax:           144.00 cm/s AV Vmean:          104.000 cm/s AV VTI:            0.253 m AV Peak Grad:      8.3 mmHg AV Mean Grad:      5.0 mmHg LVOT Vmax:         125.00 cm/s LVOT Vmean:        90.600 cm/s LVOT VTI:          0.276 m LVOT/AV VTI ratio: 1.09  AORTA Ao Root diam: 3.30 cm MITRAL VALVE MV Area (PHT): 3.48 cm    SHUNTS MV Decel Time: 218 msec    Systemic VTI:  0.28 m MV E velocity: 80.20 cm/s  Systemic Diam: 1.90 cm MV A velocity: 73.90 cm/s MV E/A ratio:  1.09 Dorn Ross MD Electronically signed by Dorn Ross MD Signature Date/Time: 01/13/2023/11:02:26 AM    Final            Blood pressure 113/74, pulse 72, temperature 98.1 F (36.7 C), temperature source Axillary, resp. rate 18, height 6' 1 (1.854 m), weight 103.9 kg, SpO2 95%.   Medical Problem List and Plan: 1. 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             -patient may shower             -  ELOS/Goals: 12-14 days, goals supervision with PT, OT and min-sup with SLP   2.  Antithrombotics: -DVT/anticoagulation:  Mechanical: Sequential compression devices, below knee Right lower extremity in addition to below.  -LE dopplers 01/13/23 were clear.             -antiplatelet therapy: Aspirin  and Brilinta  for 30 days followed by aspirin  alone. Last dose of Brilinta : 02/11/2023.   3. Pain Management: Tylenol , Robaxin , oxycodone  as needed   4. Mood/Behavior/Sleep: LCSW  to evaluate and provide emotional support             -continue Ambien  10 mg q HS             -continue Lexapro  20 daily             -antipsychotic agents: n/a   5. Neuropsych/cognition: This patient is capable of making decisions on his own behalf.   6. Skin/Wound Care: Routine skin care checks             -maintain Aquacel surgical dressing   7. Fluids/Electrolytes/Nutrition: Routine Is and Os and follow-up chemistries   8: Hypertension: monitor TID and prn (see BPH meds below)             -continue Toprol  12.5 mg q HS   9: Hyperlipidemia: continue statin   10: BPH: continue Flomax  and Proscar    11: Parkinson disease, possibly ET/PD:  -Sinemet  25/100, 2 tablets at 7 AM/2 tablets at 10 AM/2 at 1pm/2 at 4pm/2 at 7pm and 50/200 at bedtime -continue ropinirole  1 mg TID for rigidity -follows with Dr. Evonnie   12: Allergic rhinitis: continue Zaditor , Astelin , Flonase , Claritin    13: Asthma: continue Singulair    14: History of CP, NSTEMI, CAD, RBBB and high burden of PVCs s/p Link monitor in past -continue mexiletine 150 mg BID -Plavix  discontinued and now on asa and Brilinta  -continue Crestor  20 mg daily -follows with Dr. Jeffrie   15: Mexiletine GERD/Barrett's esophagus: Protonix    16: Primary OA of right knee s/p TKA 01/11/2023 (previous left TKA)             -maintain zero degree knee bone foam 10-15 minutes TID             -range of motion in bed/with therapy   17: Mild ABLA: follow-up CBC   18: Anti-constipation: continue colace 100 mg BID; prns ordered   19: History of chronic intermittent hypotension:             -continue midodrine  5 mg BID for systolic BP < 100     @Sandra  J Setzer, PA-C 01/15/2023  I have personally performed a face to face diagnostic evaluation of this patient and formulated the key components of the plan.  Additionally, I have personally reviewed laboratory data, imaging studies, as well as relevant notes and concur with the physician assistant's  documentation above.  The patient's status has not changed from the original H&P.  Any changes in documentation from the acute care chart have been noted above.  Arthea IVAR Gunther, MD, [facility]

## 2023-01-15 NOTE — Progress Notes (Signed)
 Physical Therapy Treatment Patient Details Name: Joshua [name] MRN: [id number] DOB: July 23, 1945 Today's Date: 01/15/2023   History of Present Illness 78 yo man who developed acute onset R sided weakness and aphasia after R TKA 01/11/23. NIHSS 10; aphasia; MRI Acute infarct of the left precentral gyrus and middle frontal gyrus  with petechial hemorrhage at the infarct site  PMH  CAD, carotid disease, HL, hx loop recprder, HTN, MI, Parkinsons, Stroke 2017, and vertigo    PT Comments  Pt resting in bed on arrival and agreeable to session with continued progress towards acute goals. Pt continues to require cues for increased attention to R UE/LE and sequencing through bed mobility and transfers sit<>stand. Pt requiring mod A to power up to stand on initial stand fading to CGA throughout session with subsequent stands with pt needing cues for hand placement each tim. Pt requiring grossly min A during gait to steady and for verbal and tactile cues for increased RLE stride length and extension in stance as well as attention to R hand on RW as hand/grip supinating on RW handle. Pt performing seated and standing therex with focus on extension with pt verbalizing understanding of importance of maintaining RLE in extension at rest. Current plan remains appropriate to address deficits and maximize functional independence and decrease caregiver burden. Pt continues to benefit from skilled PT services to progress toward functional mobility goals.     If plan is discharge home, recommend the following: Assist for transportation;Help with stairs or ramp for entrance;A lot of help with walking and/or transfers;A lot of help with bathing/dressing/bathroom;Assistance with cooking/housework;Direct supervision/assist for medications management;Direct supervision/assist for financial management   Can travel by private vehicle        Equipment Recommendations  Rolling walker (2 wheels) (with Rt walker splint)     Recommendations for Other Services       Precautions / Restrictions Precautions Precautions: Knee;Fall Precaution Booklet Issued: No Precaution Comments: Educated on keeping knee straight when at rest Restrictions Weight Bearing Restrictions Per Provider Order: Yes RLE Weight Bearing Per Provider Order: Weight bearing as tolerated     Mobility  Bed Mobility Overal bed mobility: Needs Assistance Bed Mobility: Supine to Sit, Sit to Supine     Supine to sit: Contact guard, HOB elevated     General bed mobility comments: Verbal and tactile cues provided for pt to look at his R extremities to manage his R leg off EOB and push through his R UE to ascend his trunk to sit up, extra time needed, CGA for safety.    Transfers Overall transfer level: Needs assistance Equipment used: Rolling walker (2 wheels) Transfers: Sit to/from Stand Sit to Stand: Mod assist, Contact guard assist, Min assist           General transfer comment: Verbal cues provided for pt to place R hand on RW and L hand on bed/chair to push up to stand. mod A the first rep from EOB, fading to min A to CGA with task practicve from chair x3    Ambulation/Gait Ambulation/Gait assistance: Min assist Gait Distance (Feet): 130 Feet Assistive device: Rolling walker (2 wheels) Gait Pattern/deviations: Decreased stride length, Step-through pattern, Decreased step length - right, Decreased step length - left, Decreased dorsiflexion - right, Trunk flexed, Knee flexed in stance - right Gait velocity: slowed     General Gait Details: Verbal cues and tactile cues provided provided at R quads during stance to promote R knee extension. Good success noted. MinA for  balance. Cues provided also for upright posture and wider BOS, fair momentary success noted   Stairs             Wheelchair Mobility     Tilt Bed    Modified Rankin (Stroke Patients Only) Modified Rankin (Stroke Patients Only) Pre-Morbid Rankin  Score: No significant disability Modified Rankin: Moderately severe disability     Balance Overall balance assessment: Needs assistance Sitting-balance support: Feet supported, No upper extremity supported Sitting balance-Leahy Scale: Fair Sitting balance - Comments: Able to sit EOB with supervision for safety   Standing balance support: Bilateral upper extremity supported Standing balance-Leahy Scale: Poor Standing balance comment: Reliant on RW                            Cognition Arousal: Alert Behavior During Therapy: WFL for tasks assessed/performed Overall Cognitive Status: Difficult to assess                                 General Comments: Follows simple cues with extra time, needing repeated cues to attend to his R side.        Exercises Total Joint Exercises Quad Sets: AROM, Right, 10 reps, Seated Heel Slides: Right, 10 reps, Seated, AAROM (with foot on towel, AAROM at end range but otherwise AROM prior to end range) Long Arc Quad: AROM, Right, 10 reps, Seated Other Exercises Other Exercises: terminal knee extension in standing x10with tactile cues    General Comments        Pertinent Vitals/Pain Pain Assessment Pain Assessment: Faces Faces Pain Scale: Hurts little more Pain Location: Rt knee Pain Descriptors / Indicators: Operative site guarding, Discomfort, Grimacing, Guarding Pain Intervention(s): Monitored during session, Limited activity within patient's tolerance    Home Living                          Prior Function            PT Goals (current goals can now be found in the care plan section) Acute Rehab PT Goals Patient Stated Goal: to improve and go to [facility] PT Goal Formulation: With patient/family Time For Goal Achievement: 01/26/23 Progress towards PT goals: Progressing toward goals    Frequency    7X/week      PT Plan      Co-evaluation              AM-PAC PT 6 Clicks Mobility    Outcome Measure  Help needed turning from your back to your side while in a flat bed without using bedrails?: None Help needed moving from lying on your back to sitting on the side of a flat bed without using bedrails?: A Little Help needed moving to and from a bed to a chair (including a wheelchair)?: A Little Help needed standing up from a chair using your arms (e.g., wheelchair or bedside chair)?: A Lot Help needed to walk in hospital room?: A Little Help needed climbing 3-5 steps with a railing? : Total 6 Click Score: 16    End of Session Equipment Utilized During Treatment: Gait belt Activity Tolerance: Patient tolerated treatment well Patient left: with call bell/phone within reach;in bed;with bed alarm set;with family/visitor present Nurse Communication: Mobility status;Other (comment) (how to use CPM) PT Visit Diagnosis: Other abnormalities of gait and mobility (R26.89);Hemiplegia and hemiparesis;Unsteadiness on feet (R26.81);Muscle weakness (generalized) (  M62.81);Difficulty in walking, not elsewhere classified (R26.2);Other symptoms and signs involving the nervous system (R29.898) Hemiplegia - Right/Left: Right Hemiplegia - dominant/non-dominant: Dominant Hemiplegia - caused by: Cerebral infarction     Time: 9085-9059 PT Time Calculation (min) (ACUTE ONLY): 26 min  Charges:    $Gait Training: 8-22 mins $Therapeutic Exercise: 8-22 mins PT General Charges $$ ACUTE PT VISIT: 1 Visit                     Robby Bulkley R. PTA Acute Rehabilitation Services Office: [phone]   Therisa CHRISTELLA Boor 01/15/2023, 9:50 AM

## 2023-01-15 NOTE — H&P (Signed)
 Physical Medicine and Rehabilitation Admission H&P   CC: Functional deficits secondary to acute infarct of the left precentral and middle frontal gyrus with petechial hemorrhage   HPI: Joshua [name] is a 78 year old male with history of Parkinson disease, prior TIAs/CVA in 2017 and end-stage right knee osteoarthritis who underwent right TKA on 01/11/2023 by Dr. Edna at [facility]. He developed acute onset of right sided weakness and aphasia after surgery in PACU. 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 He was taking Plavix  prior to admission and was started on aspirin  and Brilinta  for 4 weeks followed by aspirin  alone. 2-d echo with EF ~50-55%. A1c = 5.9%. BLE venous duplex negative for DVT. Patient will need 30-day heart monitor at discharge to look for paroxysmal A-fib. Symptoms somewhat improved and he is tolerating CPM well. He was able to ambulate an increased distance of up to ~74 ft with a RW and minA 01/02. 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  Review of Systems  Constitutional:  Negative for chills and fever.  HENT:  Negative for sore throat.   Respiratory:  Negative for cough and shortness of breath.   Cardiovascular:  Negative for chest pain and palpitations.  Gastrointestinal:  Negative for nausea and vomiting.  Genitourinary:  Negative for frequency and urgency.  Neurological:  Negative for dizziness and headaches.  Psychiatric/Behavioral:  The patient is not nervous/anxious and does not have insomnia.    Past Medical History:  Diagnosis Date   Allergic rhinitis    Anxiety    from chronic pain from surgery- on Cymbalta    Arthritis    Asthma    Barrett's esophagus 03/29/2014   Carotid artery disease (HCC)     Carotid Doppler normal August, 2007   Coronary artery disease    Diverticulosis    Dyslipidemia    GERD (gastroesophageal reflux disease)    History of loop recorder    has since 04/04/15   HTN (hypertension)    takes Metoprolol  for PVC control   Hx of colonic polyps    adenomatous   IFG (impaired fasting glucose)    Myocardial infarction (HCC)    mild   Neuromuscular disorder (HCC)    parkinson's tremors in hands   Palpitations    Benign PVCs   Parkinson disease (HCC)    Pneumonia    Prostate cancer (HCC)    RBBB (right bundle branch block)    rate related   Shingles    Stroke (HCC) 2017   TIA (transient ischemic attack) 02/2015   Per pt, had 2 strokes   Vertigo    Past Surgical History:  Procedure Laterality Date   BACK SURGERY  2002,2009   x 6   CHOLECYSTECTOMY  11/30/2012   with IOC   COLONOSCOPY     ELECTROPHYSIOLOGIC STUDY N/A 09/26/2015   Procedure: V Tach Ablation (PVC);  Surgeon: Will Gladis Norton, MD;  Location: [facility];  Service: Cardiovascular;  Laterality: N/A;   EP IMPLANTABLE DEVICE N/A 04/04/2015   Procedure: Loop Recorder Insertion;  Surgeon: Will Gladis Norton, MD;  Location: [facility];  Service: Cardiovascular;  Laterality: N/A;   HERNIA REPAIR     laprascopic   KNEE SURGERY     DECEMBER 2017,LEFT KNEE SCOPED   LEFT HEART CATH AND  CORONARY ANGIOGRAPHY N/A 02/05/2021   Procedure: LEFT HEART CATH AND CORONARY ANGIOGRAPHY;  Surgeon: Darron Deatrice LABOR, MD;  Location: [facility];  Service: Cardiovascular;  Laterality: N/A;   NECK SURGERY  2002   POLYPECTOMY     ROTATOR CUFF REPAIR Left    TEE WITHOUT CARDIOVERSION N/A 04/04/2015   Procedure: TRANSESOPHAGEAL ECHOCARDIOGRAM (TEE);  Surgeon: Redell GORMAN Shallow, MD;  Location: [facility];  Service: Cardiovascular;  Laterality: N/A;   TOTAL KNEE ARTHROPLASTY Left 09/28/2022   Procedure: TOTAL KNEE ARTHROPLASTY;  Surgeon: Edna Toribio LABOR, MD;  Location: [facility];  Service:  Orthopedics;  Laterality: Left;   TOTAL KNEE ARTHROPLASTY Right 01/11/2023   Procedure: TOTAL KNEE ARTHROPLASTY;  Surgeon: Edna Toribio LABOR, MD;  Location: [facility];  Service: Orthopedics;  Laterality: Right;   TRIGGER FINGER RELEASE Left 12/28/2019   Procedure: RELEASE TRIGGER FINGER/A-1 PULLEY THUMB, MIDDLE AND RING;  Surgeon: Murrell Kuba, MD;  Location: [facility];  Service: Orthopedics;  Laterality: Left;  FAB   UPPER GASTROINTESTINAL ENDOSCOPY     V Tach ablation  09/26/2015   Family History  Problem Relation Age of Onset   Hypertension Mother    Heart attack Father    Heart disease Father    Clotting disorder Father    Heart disease Brother    Lung cancer Brother    Prostate cancer Brother    Breast cancer Paternal Grandmother    Heart attack Paternal Grandfather    Healthy Daughter    Healthy Daughter    Esophageal cancer Neg Hx    Stomach cancer Neg Hx    Rectal cancer Neg Hx    Colon polyps Neg Hx    Social History:  reports that he has never smoked. He has never used smokeless tobacco. He reports that he does not drink alcohol  and does not use drugs. Allergies:  Allergies  Allergen Reactions   Floxin I.V. In Dextrose  5% [Ofloxacin] Other (See Comments)    Lowers BP    Terazosin Other (See Comments)    Lower bp   Medications Prior to Admission  Medication Sig Dispense Refill   acetaminophen  (TYLENOL ) 650 MG CR tablet Take 1,300 mg by mouth in the morning and at bedtime.     albuterol  (VENTOLIN  HFA) 108 (90 Base) MCG/ACT inhaler Inhale 2 puffs into the lungs every 4 (four) hours as needed for wheezing or shortness of breath.     azelastine  (OPTIVAR ) 0.05 % ophthalmic solution Place 1 drop into both eyes 2 (two) times daily.     Biotin 89999 MCG TABS Take 1,000 mcg by mouth in the morning.     carbidopa -levodopa  (SINEMET  CR) 50-200 MG tablet TAKE 1 TABLET BY MOUTH EVERYDAY AT BEDTIME 90 tablet 0   carbidopa -levodopa  (SINEMET  IR) 25-100 MG tablet 2  TABLETS AT 7 AM, 2 TABLETS AT 10 AM,2 TABLETS AT 1PM,2 TABLETS AT 4 PM /2 TABLETS AT 7 PM 900 tablet 0   cholecalciferol  (VITAMIN D3) 25 MCG (1000 UNIT) tablet Take 1,000 Units by mouth daily.     cyanocobalamin (VITAMIN B12) 1000 MCG tablet Take 1,000 mcg by mouth daily.     DYMISTA  137-50 MCG/ACT SUSP Place 1 puff into both nostrils at bedtime.      escitalopram  (LEXAPRO ) 20 MG tablet Take 20 mg by mouth daily.     finasteride  (PROSCAR ) 5 MG tablet Take 5 mg by mouth every evening.     lansoprazole  (PREVACID ) 30 MG capsule Take 30 mg by mouth  2 (two) times daily. Morning & mid-afternoon     levocetirizine (XYZAL ) 5 MG tablet Take 5 mg by mouth every evening.       Menthol , Topical Analgesic, (BIOFREEZE EX) Apply 1 Application topically daily as needed (pain).     metoprolol  succinate (TOPROL -XL) 25 MG 24 hr tablet TAKE 1/2 TABLET BY MOUTH DAILY (Patient taking differently: Take 12.5 mg by mouth at bedtime.) 45 tablet 2   mexiletine (MEXITIL ) 150 MG capsule TAKE 1 CAPSULE BY MOUTH TWICE A DAY 180 capsule 3   midodrine  (PROAMATINE ) 5 MG tablet TAKE 1 TABLET BY MOUTH 2 TIMES DAILY AS NEEDED. TAKE IF SYSTOLIC BLOOD PRESSURE GETS BELOW 100 (Patient taking differently: Take 5 mg by mouth See admin instructions. Take 5 mg daily, may take a second 5 mg dose as needed for low bp) 180 tablet 3   montelukast  (SINGULAIR ) 10 MG tablet Take 10 mg by mouth at bedtime.       Omega-3 Fatty Acids  (FISH OIL) 1000 MG CAPS Take 1,000 mg by mouth in the morning.     polyethylene glycol (MIRALAX ) 17 g packet Take 17 g by mouth daily. (Patient taking differently: Take 17 g by mouth daily as needed for moderate constipation.) 14 each 0   PROCTOSOL HC 2.5 % rectal cream APPLY INTO AND AROUND RECTUM 2 TIMES A DAY (Patient taking differently: Place 1 Application rectally 2 (two) times daily as needed for hemorrhoids or anal itching.) 28.35 g 0   rOPINIRole  (REQUIP ) 1 MG tablet TAKE 1 TABLET BY MOUTH 3 TIMES DAILY. 270 tablet  0   rosuvastatin  (CRESTOR ) 20 MG tablet Take 1 tablet (20 mg total) by mouth at bedtime. 90 tablet 0   tamsulosin  (FLOMAX ) 0.4 MG CAPS capsule Take 0.4 mg by mouth daily after supper.      zolpidem  (AMBIEN ) 10 MG tablet Take 10 mg by mouth at bedtime.     [DISCONTINUED] clopidogrel  (PLAVIX ) 75 MG tablet Take 1 tablet (75 mg total) by mouth daily. 30 tablet 2   nitroGLYCERIN  (NITROSTAT ) 0.4 MG SL tablet Place 1 tablet (0.4 mg total) under the tongue every 5 (five) minutes as needed for chest pain (Do not give more than 3 SL tablets in 15 minutes.). 25 tablet 2      Home: Home Living Family/patient expects to be discharged to:: Private residence Living Arrangements: Spouse/significant other Available Help at Discharge: Family, Available 24 hours/day Type of Home: House Home Access: Level entry Home Layout: One level Bathroom Shower/Tub: Health Visitor: Handicapped height Bathroom Accessibility: Yes Home Equipment: Agricultural Consultant (2 wheels), Information systems manager, Grab bars - tub/shower  Lives With: Spouse   Functional History: Prior Function Prior Level of Function : Independent/Modified Independent Mobility Comments: cane PRN ADLs Comments: not driving  Functional Status:  Mobility: Bed Mobility Overal bed mobility: Needs Assistance Bed Mobility: Supine to Sit, Sit to Supine Supine to sit: Contact guard, HOB elevated Sit to supine: Min assist, HOB elevated General bed mobility comments: Verbal and tactile cues provided for pt to look at his R extremities to manage his R leg off EOB and push through his R UE to ascend his trunk to sit up, extra time needed, CGA for safety. Transfers Overall transfer level: Needs assistance Equipment used: Rolling walker (2 wheels) Transfers: Sit to/from Stand Sit to Stand: Mod assist, Contact guard assist, Min assist General transfer comment: Verbal cues provided for pt to place R hand on RW and L hand on bed/chair to push up  to stand.  mod A the first rep from EOB, fading to min A to CGA with task practicve from chair x3 Ambulation/Gait Ambulation/Gait assistance: Min assist Gait Distance (Feet): 130 Feet Assistive device: Rolling walker (2 wheels) Gait Pattern/deviations: Decreased stride length, Step-through pattern, Decreased step length - right, Decreased step length - left, Decreased dorsiflexion - right, Trunk flexed, Knee flexed in stance - right General Gait Details: Verbal cues and tactile cues provided provided at R quads during stance to promote R knee extension. Good success noted. MinA for balance. Cues provided also for upright posture and wider BOS, fair momentary success noted Gait velocity: slowed Gait velocity interpretation: <1.31 ft/sec, indicative of household ambulator    ADL: ADL Overall ADL's : Needs assistance/impaired Eating/Feeding: Minimal assistance Grooming: Minimal assistance Upper Body Bathing: Moderate assistance Lower Body Bathing: Moderate assistance Upper Body Dressing : Moderate assistance Lower Body Dressing: Moderate assistance Toilet Transfer: Minimal assistance, Ambulation, Rolling walker (2 wheels) Toileting- Clothing Manipulation and Hygiene: Minimal assistance Functional mobility during ADLs: Minimal assistance, Rolling walker (2 wheels)  Cognition: Cognition Overall Cognitive Status: Difficult to assess Arousal/Alertness: Awake/alert Orientation Level: Oriented X4 Attention: Sustained Sustained Attention: Appears intact Memory:  (to be assessed) Awareness: Impaired Awareness Impairment: Emergent impairment Problem Solving:  (will assess) Safety/Judgment: Impaired Cognition Arousal: Alert Behavior During Therapy: WFL for tasks assessed/performed Overall Cognitive Status: Difficult to assess General Comments: Follows simple cues with extra time, needing repeated cues to attend to his R side. Difficult to assess due to: Impaired communication  Physical  Exam: Blood pressure 113/74, pulse 72, temperature 98.1 F (36.7 C), temperature source Axillary, resp. rate 18, height 6' 1 (1.854 m), weight 103.9 kg, SpO2 95%. Physical Exam Constitutional:      General: He is not in acute distress. HENT:     Head: Normocephalic and atraumatic.     Right Ear: External ear normal.     Left Ear: External ear normal.     Nose: Nose normal.  Eyes:     Extraocular Movements: Extraocular movements intact.     Pupils: Pupils are equal, round, and reactive to light.  Cardiovascular:     Rate and Rhythm: Normal rate and regular rhythm.     Heart sounds: No murmur heard.    No gallop.  Pulmonary:     Effort: Pulmonary effort is normal. No respiratory distress.     Breath sounds: No wheezing or rales.  Abdominal:     General: Bowel sounds are normal. There is no distension.     Palpations: Abdomen is soft.  Musculoskeletal:        General: Normal range of motion.     Cervical back: Normal range of motion.     Comments: Right TKA with swelling, about 30-40 degrees of flexion. Left TKA well healed with at least 110 degrees flexion and full extension.   Skin:    Comments: Right knee with immediate postop dressing in place with scant blood. Left knee well healed.   Neurological:     Mental Status: He is alert and oriented to person, place, and time.     Comments: Pt is alert, oriented to person, place, month/year. Needed some help with day. Language is non-fluent but with extra time he is able to communicate needs with words and short sentences. Able to ID several objects in room with minimal pause. CN exam was non-focal. Mild right hemiparesis RUE: 4+/5, RLE 2+ prox to-4/5 (confounded by RTKA). LUE and LLE  4 to 5/5. No  focal sensory loss. Cog wheeling rigidity with tremor noted in both RUE and RLE. Cog wheeling and tremor less on left side. Normal resting tone. DTR's trace to 1+  Psychiatric:        Mood and Affect: Mood normal.        Behavior: Behavior  normal.     Results for orders placed or performed during the hospital encounter of 01/11/23 (from the past 48 hours)  CBC     Status: Abnormal   Collection Time: 01/14/23  7:04 AM  Result Value Ref Range   WBC 8.3 4.0 - 10.5 K/uL   RBC 3.96 (L) 4.22 - 5.81 MIL/uL   Hemoglobin 12.0 (L) 13.0 - 17.0 g/dL   HCT 63.7 (L) 60.9 - 47.9 %   MCV 91.4 80.0 - 100.0 fL   MCH 30.3 26.0 - 34.0 pg   MCHC 33.1 30.0 - 36.0 g/dL   RDW 86.2 88.4 - 84.4 %   Platelets 197 150 - 400 K/uL   nRBC 0.0 0.0 - 0.2 %    Comment: Performed at [facility], [address], [place], [place] [address]   VAS US  LOWER EXTREMITY VENOUS (DVT) Result Date: 01/13/2023  Lower Venous DVT Study Patient Name:  Joshua [name]  Date of Exam:   01/13/2023 Medical Rec #: [id number]             Accession #:    [id number] Date of Birth: 05-04-1945            Patient Gender: M Patient Age:   73 years Exam Location:  [facility] Procedure:      VAS US  LOWER EXTREMITY VENOUS (DVT) Referring Phys: DANIEL MARCHWIANY --------------------------------------------------------------------------------  Indications: Swelling, and stroke.  Risk Factors: Right total knee replacement 01/11/23, upon awakening from anesthesia, weakness and aphasia noted. Comparison Study: No prior study on file Performing Technologist: Alberta Lis RVS  Examination Guidelines: A complete evaluation includes B-mode imaging, spectral Doppler, color Doppler, and power Doppler as needed of all accessible portions of each vessel. Bilateral testing is considered an integral part of a complete examination. Limited examinations for reoccurring indications may be performed as noted. The reflux portion of the exam is performed with the patient in reverse Trendelenburg.  +---------+---------------+---------+-----------+----------+--------------+ RIGHT    CompressibilityPhasicitySpontaneityPropertiesThrombus Aging  +---------+---------------+---------+-----------+----------+--------------+ CFV      Full           Yes      Yes                                 +---------+---------------+---------+-----------+----------+--------------+ SFJ      Full                                                        +---------+---------------+---------+-----------+----------+--------------+ FV Prox  Full                                                        +---------+---------------+---------+-----------+----------+--------------+ FV Mid   Full                                                        +---------+---------------+---------+-----------+----------+--------------+  FV DistalFull                                                        +---------+---------------+---------+-----------+----------+--------------+ PFV      Full                                                        +---------+---------------+---------+-----------+----------+--------------+ POP      Full           Yes      Yes                                 +---------+---------------+---------+-----------+----------+--------------+ PTV      Full                                                        +---------+---------------+---------+-----------+----------+--------------+ PERO     Full                                                        +---------+---------------+---------+-----------+----------+--------------+ Gastroc  Full                                                        +---------+---------------+---------+-----------+----------+--------------+   +---------+---------------+---------+-----------+----------+--------------+ LEFT     CompressibilityPhasicitySpontaneityPropertiesThrombus Aging +---------+---------------+---------+-----------+----------+--------------+ CFV      Full           Yes      Yes                                  +---------+---------------+---------+-----------+----------+--------------+ SFJ      Full                                                        +---------+---------------+---------+-----------+----------+--------------+ FV Prox  Full                                                        +---------+---------------+---------+-----------+----------+--------------+ FV Mid   Full                                                        +---------+---------------+---------+-----------+----------+--------------+  FV DistalFull                                                        +---------+---------------+---------+-----------+----------+--------------+ PFV      Full                                                        +---------+---------------+---------+-----------+----------+--------------+ POP      Full           Yes      Yes                                 +---------+---------------+---------+-----------+----------+--------------+ PTV      Full                                                        +---------+---------------+---------+-----------+----------+--------------+ PERO     Full                                                        +---------+---------------+---------+-----------+----------+--------------+     Summary: RIGHT: - There is no evidence of deep vein thrombosis in the lower extremity.  - No cystic structure found in the popliteal fossa.  LEFT: - There is no evidence of deep vein thrombosis in the lower extremity.  - No cystic structure found in the popliteal fossa.  *See table(s) above for measurements and observations. Electronically signed by Penne Colorado MD on 01/13/2023 at 5:34:36 PM.    Final    ECHOCARDIOGRAM COMPLETE Result Date: 01/13/2023    ECHOCARDIOGRAM REPORT   Patient Name:   Joshua [name] Date of Exam: 01/13/2023 Medical Rec #:  [id number]            Height:       73.0 in Accession #:    [id number]           Weight:        229.0 lb Date of Birth:  [birth year]           BSA:          2.279 m Patient Age:    77 years             BP:           113/77 mmHg Patient Gender: M                    HR:           76 bpm. Exam Location:  Inpatient Procedure: 2D Echo, Cardiac Doppler and Color Doppler Indications:    Stroke  History:        Patient has prior history of Echocardiogram examinations, most  recent 05/27/2022. CAD, Stroke; Risk Factors:Hypertension.  Sonographer:    Jayson Gaskins Referring Phys: [id number] ROCKY JAYSON LIKES IMPRESSIONS  1. Left ventricular ejection fraction, by estimation, is 50 to 55%. The left ventricle has low normal function. Left ventricular endocardial border not optimally defined to evaluate regional wall motion. There is mild left ventricular hypertrophy. Left ventricular diastolic parameters are indeterminate.  2. RV not well visualized. Grossly appears normal in size and function. . Right ventricular systolic function was not well visualized. The right ventricular size is not well visualized.  3. The mitral valve is normal in structure. No evidence of mitral valve regurgitation. No evidence of mitral stenosis.  4. The aortic valve has an indeterminant number of cusps. There is mild calcification of the aortic valve. There is mild thickening of the aortic valve. Aortic valve regurgitation is not visualized. No aortic stenosis is present.  5. The inferior vena cava is normal in size with greater than 50% respiratory variability, suggesting right atrial pressure of 3 mmHg. FINDINGS  Left Ventricle: Left ventricular ejection fraction, by estimation, is 50 to 55%. The left ventricle has low normal function. Left ventricular endocardial border not optimally defined to evaluate regional wall motion. The left ventricular internal cavity  size was normal in size. There is mild left ventricular hypertrophy. Left ventricular diastolic parameters are indeterminate. Right Ventricle: RV not well visualized. Grossly  appears normal in size and function. The right ventricular size is not well visualized. Right vetricular wall thickness was not well visualized. Right ventricular systolic function was not well visualized. Left Atrium: Left atrial size was normal in size. Right Atrium: Right atrial size was normal in size. Pericardium: There is no evidence of pericardial effusion. Mitral Valve: The mitral valve is normal in structure. No evidence of mitral valve regurgitation. No evidence of mitral valve stenosis. Tricuspid Valve: The tricuspid valve is normal in structure. Tricuspid valve regurgitation is not demonstrated. No evidence of tricuspid stenosis. Aortic Valve: The aortic valve has an indeterminant number of cusps. There is mild calcification of the aortic valve. There is mild thickening of the aortic valve. There is mild aortic valve annular calcification. Aortic valve regurgitation is not visualized. No aortic stenosis is present. Aortic valve mean gradient measures 5.0 mmHg. Aortic valve peak gradient measures 8.3 mmHg. Aortic valve area, by VTI measures 3.09 cm. Pulmonic Valve: The pulmonic valve was not well visualized. Pulmonic valve regurgitation is not visualized. No evidence of pulmonic stenosis. Aorta: The aortic root is normal in size and structure. Venous: The inferior vena cava is normal in size with greater than 50% respiratory variability, suggesting right atrial pressure of 3 mmHg. IAS/Shunts: No atrial level shunt detected by color flow Doppler.  LEFT VENTRICLE PLAX 2D LVIDd:         4.60 cm   Diastology LVIDs:         3.20 cm   LV e' medial:    5.98 cm/s LV PW:         1.20 cm   LV E/e' medial:  13.4 LV IVS:        1.20 cm   LV e' lateral:   9.79 cm/s LVOT diam:     1.90 cm   LV E/e' lateral: 8.2 LV SV:         78 LV SV Index:   34 LVOT Area:     2.84 cm  RIGHT VENTRICLE RV S prime:     11.20 cm/s TAPSE (M-mode): 2.2 cm LEFT ATRIUM  Index        RIGHT ATRIUM           Index LA Vol (A2C): 36.5  ml 16.02 ml/m  RA Area:     10.50 cm LA Vol (A4C): 43.1 ml 18.91 ml/m  RA Volume:   19.60 ml  8.60 ml/m  AORTIC VALVE AV Area (Vmax):    2.46 cm AV Area (Vmean):   2.47 cm AV Area (VTI):     3.09 cm AV Vmax:           144.00 cm/s AV Vmean:          104.000 cm/s AV VTI:            0.253 m AV Peak Grad:      8.3 mmHg AV Mean Grad:      5.0 mmHg LVOT Vmax:         125.00 cm/s LVOT Vmean:        90.600 cm/s LVOT VTI:          0.276 m LVOT/AV VTI ratio: 1.09  AORTA Ao Root diam: 3.30 cm MITRAL VALVE MV Area (PHT): 3.48 cm    SHUNTS MV Decel Time: 218 msec    Systemic VTI:  0.28 m MV E velocity: 80.20 cm/s  Systemic Diam: 1.90 cm MV A velocity: 73.90 cm/s MV E/A ratio:  1.09 Dorn Ross MD Electronically signed by Dorn Ross MD Signature Date/Time: 01/13/2023/11:02:26 AM    Final       Blood pressure 113/74, pulse 72, temperature 98.1 F (36.7 C), temperature source Axillary, resp. rate 18, height 6' 1 (1.854 m), weight 103.9 kg, SpO2 95%.  Medical Problem List and Plan: 1. Functional deficits secondary to embolic stroke involving he left precentral and middle frontal gyri with associated petechial hemorhage.  -patient may shower  -ELOS/Goals: 12-14 days, goals supervision with PT, OT and min-sup with SLP  2.  Antithrombotics: -DVT/anticoagulation:  Mechanical: Sequential compression devices, below knee Right lower extremity in addition to below.   -antiplatelet therapy: Aspirin  and Brilinta  for 30 days followed by aspirin  alone. Last dose of Brilinta : 02/11/2023.  3. Pain Management: Tylenol , Robaxin , oxycodone  as needed  4. Mood/Behavior/Sleep: LCSW to evaluate and provide emotional support  -continue Ambien  10 mg q HS  -continue Lexapro  20 daily  -antipsychotic agents: n/a  5. Neuropsych/cognition: This patient is capable of making decisions on his own behalf.  6. Skin/Wound Care: Routine skin care checks  -maintain Aquacel surgical dressing   7.  Fluids/Electrolytes/Nutrition: Routine Is and Os and follow-up chemistries  8: Hypertension: monitor TID and prn (see BPH meds below)  -continue Toprol  12.5 mg q HS  9: Hyperlipidemia: continue statin  10: BPH: continue Flomax  and Proscar   11: Parkinson disease, possibly ET/PD:  -Sinemet  25/100, 2 tablets at 7 AM/2 tablets at 10 AM/2 at 1pm/2 at 4pm/2 at 7pm and 50/200 at bedtime -continue ropinirole  1 mg TID for rigidity -follows with Dr. Evonnie  12: Allergic rhinitis: continue Zaditor , Astelin , Flonase , Claritin   13: Asthma: continue Singulair   14: History of CP, NSTEMI, CAD, RBBB and high burden of PVCs s/p Link monitor in past -continue mexiletine 150 mg BID -Plavix  discontinued and now on asa and Brilinta  -continue Crestor  20 mg daily -follows with Dr. Jeffrie  15: Mexiletine GERD/Barrett's esophagus: Protonix   16: Primary OA of right knee s/p TKA 01/11/2023 (previous left TKA)  -maintain zero degree knee bone foam 10-15 minutes TID  -? continue CPM unit  17: Mild ABLA: follow-up  CBC  18: Anti-constipation: continue colace 100 mg BID; prns ordered  19: History of chronic intermittent hypotension:  -continue midodrine  5 mg BID for systolic BP < 100   @Sandra  J Setzer, PA-C 01/15/2023

## 2023-01-15 NOTE — Discharge Summary (Signed)
 Physician Discharge Summary  Patient ID: Joshua [name] MRN: [id number] DOB/AGE: 1945/10/25 78 y.o.  Admit date: 01/15/2023 Discharge date: 01/27/2023  Discharge Diagnoses:  Principal Problem:   CVA (cerebral vascular accident) [facility]) Active problems: Functional deficits secondary to CVA Right TKA Parkinsons disease CAD Hypertension Osteoarthritis   Discharged Condition: Stable  Significant Diagnostic Studies:  Narrative & Impression  CLINICAL DATA:  Postoperative state.  Evaluate hardware.   EXAM: RIGHT KNEE - 1-2 VIEW   COMPARISON:  Right knee radiographs 01/11/2023   FINDINGS: Redemonstration of total right knee arthroplasty. No perihardware lucency is seen to indicate hardware failure or loosening. Resolution of the prior intra-articular and subcutaneous postoperative air. No significant joint effusion, with decrease in the prior joint fluid. No acute fracture or dislocation. Mild anterior knee soft tissue swelling is similar to prior. Mild atherosclerotic calcifications.   IMPRESSION: Total right knee arthroplasty without evidence of hardware failure.     Electronically Signed   By: Bertina Broccoli M.D.   On: 01/25/2023 11:42      Narrative & Impression  CLINICAL DATA:  Right ankle pain   EXAM: RIGHT ANKLE - 2 VIEW   COMPARISON:  None Available.   FINDINGS: Calcaneal spurring is noted. No acute fracture or dislocation is noted. No soft tissue changes are seen.   IMPRESSION: No acute abnormality noted.     Electronically Signed   By: Violeta Grey M.D.   On: 01/20/2023 19:09    Labs:  Basic Metabolic Panel: Recent Labs  Lab 01/25/23 0455  NA 136  K 4.2  CL 105  CO2 23  GLUCOSE 106*  BUN 16  CREATININE 0.93  CALCIUM  9.0    CBC: Recent Labs  Lab 01/25/23 0455  WBC 8.0  HGB 11.6*  HCT 35.2*  MCV 92.4  PLT 352    CBG: No results for input(s): "GLUCAP" in the last 168 hours.   Brief HPI:   Joshua [name] is a 78  y.o. male with history of Parkinson disease, prior TIAs/CVA in 2017 and end-stage right knee osteoarthritis who underwent right TKA on 01/11/2023 by Dr. Pryor Browning at [facility]. He developed acute onset of right sided weakness and aphasia after surgery in PACU. 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 He was taking Plavix  prior to admission and was started on aspirin  and Brilinta  for 4 weeks followed by aspirin  alone. 2-d echo with EF ~50-55%. A1c = 5.9%. BLE venous duplex negative for DVT. Patient will need 30-day heart monitor at discharge to look for paroxysmal A-fib. Symptoms somewhat improved and he is tolerating CPM well. He was able to ambulate an increased distance of up to ~74 ft with a RW and minA 01/02.    Hospital Course: Joshua [name] was admitted to rehab 01/15/2023 for inpatient therapies to consist of PT, ST and OT at least three hours five days a week. Past admission physiatrist, therapy team and rehab RN have worked together to provide customized collaborative inpatient rehab. Follow-up labs stable. Continued CPM per Dr. Pryor Browning. Some increased right knee pain but manageable. Reported medial ankle pain on weight-bearing 1/08. X-ray obtained. Nothing acute. Air splint ordered. Seen by Dr. Pryor Browning on 1/10>> right knee xray: Total knee arthroplasty components in good position no adverse features.  Right ankle pain resolved 1/13. 30 day event monitor arranged.    Blood pressures were monitored on TID basis and Toprol  12.5 mg nightly continued.  With a history of chronic intermittent hypotension prior to admission, midodrine  5 mg twice daily for systolic blood pressure less than 100 ordered.    Rehab course: During patient's stay in rehab weekly team conferences were held to monitor patient's progress, set goals and discuss barriers to discharge. At admission, patient required min assist with  mobility and Min-Mod A with basic self-care skills.   He has had improvement in activity tolerance, balance, postural control as well as ability to compensate for deficits. He has had improvement in functional use RUE/LUE  and RLE/LLE as well as improvement in awareness..  Patient perform supine to sit modified independent requiring no cues for technique.  Perform sit to stand and stand pivot transfers distant supervision.  Ambulates 350 feet rolling walker modified independent supervision.  Completed twelve 6 inch steps using bilateral handrails and even leading with right lower extremity with supervision.  She does need some reminders to sit down to doff her pants over her feet versus standing and alternating standing on 1 leg.  He does  gather his belongings for activities of daily living and homemaking.  Speech therapy follow-up patient alert some difficulty differentiating word finding strategies versus memory strategies.  Speech therapy address cognition through executive function scheduling task and bill interpretation and discussed with family.  Full family teaching completed and discharged to home       Disposition:  Discharge disposition: 06-Home-Health Care Svc        Diet: Regular  Special Instructions: No driving, alcohol  consumption or tobacco use.  30 day event monitor arranged and being mailed to patient's home. He or a family member should put it on, follow up appt is in 6 weeks to review the monitor. Cardiology provider contacted cards office person who will help with instructions.   Medications at discharge. 1.  Tylenol  as needed 2.  Aspirin  81 mg p.o. daily 3.  Sinemet  CR 50-200 mg nightly 4.  Sinemet  25-102 tablets 5 times daily 5.  Colace 200 mg p.o. twice daily hold for loose stools 6.  Lexapro  20 mg p.o. daily 7.  Proscar  5 mg p.o. every evening 8.Optivar  ophthalmic solution 0.05% 1 drop both eyes twice daily 9.  Claritin  10 mg p.o. every evening 10.  Brilinta   90 mg p.o. twice daily 11.  Toprol -XL 12.5 mg p.o. nightly 12.  Mexitil  150 mg p.o. twice daily 13.  Singulair  10 mg p.o. nightly 14.  Oxycodone  5 to 10 mg every 4 hours as needed pain 15.  Prevacid  30 mg p.o. twice daily 16.  MiraLAX  daily hold for loose stools 17.  Requip  1 mg p.o. 3 times daily 18.  Crestor  20 mg p.o. nightly 19.  Flomax  0.4 mg daily after supper 20.  Albuterol  Ventolin  inhaler 2 puffs every 4 hours as needed 21.  Biotin 1000 mcg daily 22.  Vitamin D  1000 units daily 23.  Vitamin B12 1000 mcg p.o. daily 24.Dymista  137-50 mcg 1 puff both nostrils bedtime 25.  Nitroglycerin  as needed 26.  Omega-3 fatty acids  1000 mg daily 27.  Proctosol rectal cream twice daily 28.  Xyzal   5 mg every evening 29.  ProAmatine  5 mg twice daily as needed take if systolic blood pressure gets below 100 30.  Ambien  10 mg nightly   30-35 minutes were spent completing discharge summary and discharge planning    Follow-up Information     Jeannine Milroy., MD Follow up.   Specialty: Internal Medicine Why: Call the office in 1 to  2 days to make arrangements for hospital follow-up appointment Contact information: [address] [place] [address] [phone]         Genetta Kenning, MD Follow up.   Specialty: Physical Medicine and Rehabilitation Why: office will call you to arrange your appt (sent) Contact information: [address] [place] [address] [phone]         TatVon Grumbling, DO. Go to.   Specialty: Neurology Why: Call the office in 1 to 2 days to make arrangements for hospital follow-up appointment Contact information: [address] [place]  [address] [place] [address] [phone]         Hugh Madura, MD Follow up.   Specialty: Cardiology Why: Follow-up as instructed. Contact information: [address] [place] [address] [phone]         Murleen Arms, MD Follow up.    Specialty: Orthopedic Surgery Why: Call the office in 1 to 2 days to make hospital follow-up appointment in 2 weeks. Contact information: [address] [place] [address] [phone]                 Signed: Geradine Kitten 01/27/2023, 7:36 AM

## 2023-01-15 NOTE — Progress Notes (Signed)
 Orthopedic Tech Progress Note Patient Details:  Joshua [name] 20-Jun-1945 [id number]  Placed patient on CPM at 6:50am  CPM Right Knee CPM Right Knee: On Right Knee Flexion (Degrees): 0 Right Knee Extension (Degrees): 80 Additional Comments: RLE  Post Interventions Patient Tolerated: Well Instructions Provided: Care of device  Bernarda JAYSON December 01/15/2023, 7:00 AM

## 2023-01-16 DIAGNOSIS — D62 Acute posthemorrhagic anemia: Secondary | ICD-10-CM | POA: Diagnosis not present

## 2023-01-16 DIAGNOSIS — I10 Essential (primary) hypertension: Secondary | ICD-10-CM | POA: Diagnosis not present

## 2023-01-16 DIAGNOSIS — I63412 Cerebral infarction due to embolism of left middle cerebral artery: Secondary | ICD-10-CM | POA: Diagnosis not present

## 2023-01-16 LAB — COMPREHENSIVE METABOLIC PANEL
ALT: <5 U/L (ref 0–44)
AST: 16 U/L (ref 15–41)
Albumin: 3.3 g/dL — ABNORMAL LOW (ref 3.5–5.0)
Alkaline Phosphatase: 51 U/L (ref 38–126)
Anion gap: 10 (ref 5–15)
BUN: 19 mg/dL (ref 8–23)
CO2: 22 mmol/L (ref 22–32)
Calcium: 9 mg/dL (ref 8.9–10.3)
Chloride: 104 mmol/L (ref 98–111)
Creatinine, Ser: 0.85 mg/dL (ref 0.61–1.24)
GFR, Estimated: >60 mL/min (ref >60)
Glucose, Bld: 107 mg/dL — ABNORMAL HIGH (ref 70–99)
Potassium: 4.2 mmol/L (ref 3.5–5.1)
Sodium: 136 mmol/L (ref 135–145)
Total Bilirubin: 1.7 mg/dL — ABNORMAL HIGH (ref 0.0–1.2)
Total Protein: 6.5 g/dL (ref 6.5–8.1)

## 2023-01-16 LAB — CBC WITH DIFFERENTIAL/PLATELET
Abs Immature Granulocytes: 0.12 10*3/uL — ABNORMAL HIGH (ref 0.00–0.07)
Basophils Absolute: 0.1 10*3/uL (ref 0.0–0.1)
Basophils Relative: 1 %
Eosinophils Absolute: 0.4 10*3/uL (ref 0.0–0.5)
Eosinophils Relative: 4 %
HCT: 37.6 % — ABNORMAL LOW (ref 39.0–52.0)
Hemoglobin: 12.3 g/dL — ABNORMAL LOW (ref 13.0–17.0)
Immature Granulocytes: 2 %
Lymphocytes Relative: 19 %
Lymphs Abs: 1.6 10*3/uL (ref 0.7–4.0)
MCH: 30.6 pg (ref 26.0–34.0)
MCHC: 32.7 g/dL (ref 30.0–36.0)
MCV: 93.5 fL (ref 80.0–100.0)
Monocytes Absolute: 0.9 10*3/uL (ref 0.1–1.0)
Monocytes Relative: 11 %
Neutro Abs: 5.2 10*3/uL (ref 1.7–7.7)
Neutrophils Relative %: 63 %
Platelets: 252 10*3/uL (ref 150–400)
RBC: 4.02 MIL/uL — ABNORMAL LOW (ref 4.22–5.81)
RDW: 13.8 % (ref 11.5–15.5)
WBC: 8.2 10*3/uL (ref 4.0–10.5)
nRBC: 0 % (ref 0.0–0.2)

## 2023-01-16 MED ORDER — POLYETHYLENE GLYCOL 3350 17 G PO PACK
17.0000 g | PACK | Freq: Every day | ORAL | Status: DC
  Administered 2023-01-16 – 2023-01-27 (×10): 17 g via ORAL
  Filled 2023-01-16 (×12): qty 1

## 2023-01-16 MED ORDER — POLYETHYLENE GLYCOL 3350 17 G PO PACK: 17.0000 g | PACK | Freq: Every day | ORAL | Status: DC

## 2023-01-16 NOTE — Evaluation (Signed)
 Speech Language Pathology Assessment and Plan  Patient Details  Name: Joshua [name] MRN: [id number] Date of Birth: Jan 29, 1945  SLP Diagnosis: Aphasia;Apraxia;Cognitive Impairments  Rehab Potential: Excellent ELOS: 10-12 days    Today's Date: 01/16/2023 SLP Individual Time: 1101-1200 SLP Individual Time Calculation (min): 59 min   Hospital Problem: Principal Problem:   CVA (cerebral vascular accident) [facility])  Past Medical History:  Past Medical History:  Diagnosis Date   Allergic rhinitis    Anxiety    from chronic pain from surgery- on Cymbalta    Arthritis    Asthma    Barrett's esophagus 03/29/2014   Carotid artery disease (HCC)    Carotid Doppler normal August, 2007   Coronary artery disease    Diverticulosis    Dyslipidemia    GERD (gastroesophageal reflux disease)    History of loop recorder    has since 04/04/15   HTN (hypertension)    takes Metoprolol  for PVC control   Hx of colonic polyps    adenomatous   IFG (impaired fasting glucose)    Myocardial infarction (HCC)    mild   Neuromuscular disorder (HCC)    parkinson's tremors in hands   Palpitations    Benign PVCs   Parkinson disease (HCC)    Pneumonia    Prostate cancer (HCC)    RBBB (right bundle branch block)    rate related   Shingles    Stroke (HCC) 2017   TIA (transient ischemic attack) 02/2015   Per pt, had 2 strokes   Vertigo    Past Surgical History:  Past Surgical History:  Procedure Laterality Date   BACK SURGERY  2002,2009   x 6   CHOLECYSTECTOMY  11/30/2012   with IOC   COLONOSCOPY     ELECTROPHYSIOLOGIC STUDY N/A 09/26/2015   Procedure: V Tach Ablation (PVC);  Surgeon: Will Gladis Norton, MD;  Location: [facility] INVASIVE CV LAB;  Service: Cardiovascular;  Laterality: N/A;   EP IMPLANTABLE DEVICE N/A 04/04/2015   Procedure: Loop Recorder Insertion;  Surgeon: Will Gladis Norton, MD;  Location: [facility] INVASIVE CV LAB;  Service: Cardiovascular;  Laterality: N/A;   HERNIA REPAIR      laprascopic   KNEE SURGERY     DECEMBER 2017,LEFT KNEE SCOPED   LEFT HEART CATH AND CORONARY ANGIOGRAPHY N/A 02/05/2021   Procedure: LEFT HEART CATH AND CORONARY ANGIOGRAPHY;  Surgeon: Darron Deatrice LABOR, MD;  Location: [facility] INVASIVE CV LAB;  Service: Cardiovascular;  Laterality: N/A;   NECK SURGERY  2002   POLYPECTOMY     ROTATOR CUFF REPAIR Left    TEE WITHOUT CARDIOVERSION N/A 04/04/2015   Procedure: TRANSESOPHAGEAL ECHOCARDIOGRAM (TEE);  Surgeon: Redell GORMAN Shallow, MD;  Location: [facility] ENDOSCOPY;  Service: Cardiovascular;  Laterality: N/A;   TOTAL KNEE ARTHROPLASTY Left 09/28/2022   Procedure: TOTAL KNEE ARTHROPLASTY;  Surgeon: Edna Toribio LABOR, MD;  Location: [facility] ORS;  Service: Orthopedics;  Laterality: Left;   TOTAL KNEE ARTHROPLASTY Right 01/11/2023   Procedure: TOTAL KNEE ARTHROPLASTY;  Surgeon: Edna Toribio LABOR, MD;  Location: [facility] ORS;  Service: Orthopedics;  Laterality: Right;   TRIGGER FINGER RELEASE Left 12/28/2019   Procedure: RELEASE TRIGGER FINGER/A-1 PULLEY THUMB, MIDDLE AND RING;  Surgeon: Murrell Kuba, MD;  Location: [facility];  Service: Orthopedics;  Laterality: Left;  FAB   UPPER GASTROINTESTINAL ENDOSCOPY     V Tach ablation  09/26/2015    Assessment / Plan / Recommendation Clinical Impression HPI:  Pt is a 78 y/o male with PMH of CAD,  loop recorder, HTN, MI, Parkinson's, CVA in 2017, and vertigo who admitted to [facility] on 01/11/23 for a planned R TKA.  In PACU noted new neuro deficits including R hemiparesis, slurred speech.  NIHSS 10, CT negative.  Unable to give TNK given post-op status.  CTA h/n with no LVO.  Neurology consulted and pt was transferred to [facility].  MRi confirmed small L frontal stroke in the distal L MCA territory with petechial hemorrhage.  Recommendations for aspirin  and brilinta  x4 weeks, the aspirin  alone, and 30-day cardiact monitor at d/c to evaluate for afib   Clinical Impression:  Bedside Swallow Evaluation: A bedside swallow  evaluation was completed to assess for s/sx of oropharyngeal dysphagia. Oral mechanism exam WFL. POs administered included thin liquids, solid and pill. Patient with adequate oral clearance, timely mastication and no s/sx of aspiration throughout all consistencies trialed. Recommend continuation of current diet (regular/thin) with medications whole in water . Utilize standardized swallowing precautions including sitting upright at 90 degrees, slow rate, small bites/sips. Patient may require physical aid during meals due to CVA and Parkinson's dx. SLP will sign off.  Communication: Patient presents with expressive>receptive aphasia characterized by mild word finding difficulty during conversation, persistent hesitations, phonemic/semantic paraphasias and false starts. Patient was able to answer simple/complex yes/no questions and follow multi-step commands accurately. Patient inconsistently, though oftentimes aware of errors. Cognition: The Cognistat was utilized to evaluate patients cognitive-linguistic skills. Patient with moderate deficits in attention, memory and problem solving and severe impairments during registration and visual spatial tasks. SLP observed R inattention and reduced emergent awareness during visual spatial task and throughout entirety of evaluation. Recommend targeting sustained attention to R, emergent awareness of mistakes, memory, and problem solving skills during inpatient stay.  Pt would benefit from skilled ST services to maximize communication and cognition in order to maximize functional independence at d/c. Anticipate patient will require 24 hour supervision at d/c and f/u SLP services.    Skilled Therapeutic Interventions          Patient evaluated using a non-standardized cognitive linguistic assessment and bedside swallow evaluation to assess current cognitive, communicative and swallowing function. See above for details.    SLP Assessment  Patient will need skilled Speech  [place] Pathology Services during CIR admission    Recommendations  SLP Diet Recommendations: Age appropriate regular solids;Thin Liquid Administration via: Cup;Straw Medication Administration: Whole meds with liquid Supervision: Staff to assist with self feeding Compensations: Slow rate;Small sips/bites;Follow solids with liquid Postural Changes and/or Swallow Maneuvers: Seated upright 90 degrees Oral Care Recommendations: Oral care BID Patient destination: Home Follow up Recommendations: Home Health SLP;Outpatient SLP Equipment Recommended: None recommended by SLP    SLP Frequency 3 to 5 out of 7 days   SLP Duration  SLP Intensity  SLP Treatment/Interventions 10-12 days  Minumum of 1-2 x/day, 30 to 90 minutes  Cognitive remediation/compensation;Internal/external aids;Speech/Language facilitation;Cueing hierarchy;Therapeutic Activities;DME/adaptive equipment instruction;Functional tasks;Patient/family education;Therapeutic Exercise;Multimodal communication approach    Pain None reported  SLP Evaluation Cognition Overall Cognitive Status: Impaired/Different from baseline Arousal/Alertness: Awake/alert Orientation Level: Oriented X4 Year: 2025 Month: January Day of Week: Incorrect Attention: Sustained Sustained Attention: Impaired Sustained Attention Impairment: Verbal basic;Functional basic Memory: Impaired Memory Impairment: Storage deficit;Retrieval deficit;Decreased short term memory Decreased Short Term Memory: Verbal basic;Functional basic Awareness: Impaired Awareness Impairment: Emergent impairment Problem Solving: Impaired Problem Solving Impairment: Verbal basic;Functional basic  Comprehension Auditory Comprehension Overall Auditory Comprehension: Appears within functional limits for tasks assessed Yes/No Questions: Within Functional Limits Complex Questions: 75-100% accurate Commands: Within  Functional Limits Two Step Basic Commands: 75-100%  accurate Multistep Basic Commands: 75-100% accurate Conversation: Simple Expression Expression Primary Mode of Expression: Verbal Verbal Expression Overall Verbal Expression: Impaired Initiation: No impairment Level of Generative/Spontaneous Verbalization: Phrase Repetition: Impaired Level of Impairment: Sentence level Naming: Impairment (90% with confrontational naming, occasional anomia during conversation) Verbal Errors: Phonemic paraphasias;Semantic paraphasias Pragmatics: No impairment Oral Motor Oral Motor/Sensory Function Overall Oral Motor/Sensory Function: Within functional limits Facial ROM: Within Functional Limits Facial Symmetry: Within Functional Limits Lingual ROM: Within Functional Limits Lingual Symmetry: Within Functional Limits Motor Speech Overall Motor Speech: Appears within functional limits for tasks assessed Respiration: Within functional limits Phonation: Normal Resonance: Within functional limits Articulation: Within functional limitis Intelligibility: Intelligible Motor Planning: Impaired Level of Impairment: Word Motor Speech Errors: Inconsistent (inconsistently aware) Effective Techniques: Slow rate  Care Tool Care Tool Cognition Ability to hear (with hearing aid or hearing appliances if normally used Ability to hear (with hearing aid or hearing appliances if normally used): 1. Minimal difficulty - difficulty in some environments (e.g. when person speaks softly or setting is noisy)   Expression of Ideas and Wants Expression of Ideas and Wants: 3. Some difficulty - exhibits some difficulty with expressing needs and ideas (e.g, some words or finishing thoughts) or speech is not clear   Understanding Verbal and Non-Verbal Content Understanding Verbal and Non-Verbal Content: 3. Usually understands - understands most conversations, but misses some part/intent of message. Requires cues at times to understand  Memory/Recall Ability Memory/Recall Ability  : Current season;That he or she is in a hospital/hospital unit   Bedside Swallowing Assessment General Diet Prior to this Study: Regular;Thin liquids (Level 0) Respiratory Status: Room air Behavior/Cognition: Alert;Cooperative;Pleasant mood Self-Feeding Abilities: Needs assist;Other (Comment) (parkinsons) Patient Positioning: Upright in bed Baseline Vocal Quality: Normal Volitional Cough: Strong Volitional Swallow: Able to elicit  Ice Chips Ice chips: Not tested Thin Liquid Thin Liquid: Within functional limits Nectar Thick Nectar Thick Liquid: Not tested Honey Thick Honey Thick Liquid: Not tested Puree Puree: Not tested Solid Solid: Within functional limits BSE Assessment Risk for Aspiration Impact on safety and function: Mild aspiration risk Other Related Risk Factors: Cognitive impairment;Deconditioning;Other (comment) (parkinsons)  Short Term Goals: Week 1: SLP Short Term Goal 1 (Week 1): Patient will utilize compensatory strategies to increase word finding of abstract thoughts during conversation given mod multimodal A SLP Short Term Goal 2 (Week 1): Patient will demonstrate problem solving abilities during basic-mildly complex functional daily situations given mod multimodal A SLP Short Term Goal 3 (Week 1): Patient will recall and utilize memory compensatory strategies given mod multimodal A SLP Short Term Goal 4 (Week 1): Patient will demonstrate attention to R during functional tasks given mod multimodal A SLP Short Term Goal 5 (Week 1): Patient will demonstrate awareness of errors during functional tasks given mod multimodal A  Refer to Care Plan for Long Term Goals  Recommendations for other services: None   Discharge Criteria: Patient will be discharged from SLP if patient refuses treatment 3 consecutive times without medical reason, if treatment goals not met, if there is a change in medical status, if patient makes no progress towards goals or if patient is  discharged from hospital.  The above assessment, treatment plan, treatment alternatives and goals were discussed and mutually agreed upon: by patient  Tenee Wish M.A., CF-SLP 01/16/2023, 12:40 PM

## 2023-01-16 NOTE — Plan of Care (Signed)
  Problem: Consults Goal: RH STROKE PATIENT EDUCATION Description: See Patient Education module for education specifics  Outcome: Progressing   Problem: RH BOWEL ELIMINATION Goal: RH STG MANAGE BOWEL WITH ASSISTANCE Description: STG Manage Bowel with toileting Assistance. Outcome: Progressing Goal: RH STG MANAGE BOWEL W/MEDICATION W/ASSISTANCE Description: STG Manage Bowel with Medication with mod I  Assistance. Outcome: Progressing   Problem: RH BLADDER ELIMINATION Goal: RH STG MANAGE BLADDER WITH ASSISTANCE Description: STG Manage Bladder With toileting Assistance Outcome: Progressing Goal: RH STG MANAGE BLADDER WITH MEDICATION WITH ASSISTANCE Description: STG Manage Bladder With Medication With mod I Assistance. Outcome: Progressing   Problem: RH SAFETY Goal: RH STG ADHERE TO SAFETY PRECAUTIONS W/ASSISTANCE/DEVICE Description: STG Adhere to Safety Precautions With cues  Assistance/Device. Outcome: Progressing   Problem: RH PAIN MANAGEMENT Goal: RH STG PAIN MANAGED AT OR BELOW PT'S PAIN GOAL Description: < 4 with prns Outcome: Progressing   Problem: RH KNOWLEDGE DEFICIT Goal: RH STG INCREASE KNOWLEDGE OF HYPERTENSION Description: Patient and spouse will be able to manage HTN using educational resources for medications and dietary modification independently Outcome: Progressing Goal: RH STG INCREASE KNOWLEDGE OF STROKE PROPHYLAXIS Description: Patient and spouse will be able to manage secondary risks using educational resources for medications and dietary modification independently Outcome: Progressing

## 2023-01-16 NOTE — Plan of Care (Signed)
  Problem: RH Balance Goal: LTG Patient will maintain dynamic standing with ADLs (OT) Description: LTG:  Patient will maintain dynamic standing balance with assist during activities of daily living (OT)  Flowsheets (Taken 01/16/2023 1543) LTG: Pt will maintain dynamic standing balance during ADLs with: Supervision/Verbal cueing   Problem: RH Eating Goal: LTG Patient will perform eating w/assist, cues/equip (OT) Description: LTG: Patient will perform eating with assist, with/without cues using equipment (OT) Flowsheets (Taken 01/16/2023 1543) LTG: Pt will perform eating with assistance level of: Independent with assistive device    Problem: RH Grooming Goal: LTG Patient will perform grooming w/assist,cues/equip (OT) Description: LTG: Patient will perform grooming with assist, with/without cues using equipment (OT) Flowsheets (Taken 01/16/2023 1543) LTG: Pt will perform grooming with assistance level of: Independent with assistive device    Problem: RH Bathing Goal: LTG Patient will bathe all body parts with assist levels (OT) Description: LTG: Patient will bathe all body parts with assist levels (OT) Flowsheets (Taken 01/16/2023 1543) LTG: Pt will perform bathing with assistance level/cueing: Supervision/Verbal cueing   Problem: RH Dressing Goal: LTG Patient will perform upper body dressing (OT) Description: LTG Patient will perform upper body dressing with assist, with/without cues (OT). Flowsheets (Taken 01/16/2023 1543) LTG: Pt will perform upper body dressing with assistance level of: Independent with assistive device Goal: LTG Patient will perform lower body dressing w/assist (OT) Description: LTG: Patient will perform lower body dressing with assist, with/without cues in positioning using equipment (OT) Flowsheets (Taken 01/16/2023 1543) LTG: Pt will perform lower body dressing with assistance level of: Supervision/Verbal cueing   Problem: RH Toileting Goal: LTG Patient will perform  toileting task (3/3 steps) with assistance level (OT) Description: LTG: Patient will perform toileting task (3/3 steps) with assistance level (OT)  Flowsheets (Taken 01/16/2023 1543) LTG: Pt will perform toileting task (3/3 steps) with assistance level: Supervision/Verbal cueing   Problem: RH Functional Use of Upper Extremity Goal: LTG Patient will use RT/LT upper extremity as a (OT) Description: LTG: Patient will use right/left upper extremity as a stabilizer/gross assist/diminished/nondominant/dominant level with assist, with/without cues during functional activity (OT) Flowsheets (Taken 01/16/2023 1543) LTG: Use of upper extremity in functional activities: RUE as diminished level LTG: Pt will use upper extremity in functional activity with assistance level of: Independent with assistive device   Problem: RH Toilet Transfers Goal: LTG Patient will perform toilet transfers w/assist (OT) Description: LTG: Patient will perform toilet transfers with assist, with/without cues using equipment (OT) Flowsheets (Taken 01/16/2023 1543) LTG: Pt will perform toilet transfers with assistance level of: Supervision/Verbal cueing   Problem: RH Tub/Shower Transfers Goal: LTG Patient will perform tub/shower transfers w/assist (OT) Description: LTG: Patient will perform tub/shower transfers with assist, with/without cues using equipment (OT) Flowsheets (Taken 01/16/2023 1543) LTG: Pt will perform tub/shower stall transfers with assistance level of: Contact Guard/Touching assist

## 2023-01-16 NOTE — Progress Notes (Signed)
 PROGRESS NOTE   Subjective/Complaints:  Pt doing great, slept well, pain well controlled, LBM last night, urinating fine, denies any other complaints or concerns today. Up with therapy.   ROS: as per HPI. Denies CP, SOB, abd pain, N/V/D/C, or any other complaints at this time.    Objective:   No results found. Recent Labs    01/14/23 0704 01/16/23 0612  WBC 8.3 8.2  HGB 12.0* 12.3*  HCT 36.2* 37.6*  PLT 197 252   Recent Labs    01/16/23 0612  NA 136  K 4.2  CL 104  CO2 22  GLUCOSE 107*  BUN 19  CREATININE 0.85  CALCIUM  9.0      Intake/Output Summary (Last 24 hours) at 01/16/2023 1109 Last data filed at 01/16/2023 9272 Gross per 24 hour  Intake 120 ml  Output 550 ml  Net -430 ml     Pressure Injury 01/15/23 Thigh Anterior;Right Stage 1 -  Intact skin with non-blanchable redness of a localized area usually over a bony prominence. long reddened area likely from contact with ice pack or ted hose (Active)  01/15/23 1357  Location: Thigh  Location Orientation: Anterior;Right  Staging: Stage 1 -  Intact skin with non-blanchable redness of a localized area usually over a bony prominence.  Wound Description (Comments): long reddened area likely from contact with ice pack or ted hose  Present on Admission: Yes    Physical Exam: Vital Signs Blood pressure 124/80, pulse 71, temperature 98.1 F (36.7 C), resp. rate 18, height 6' 1 (1.854 m), weight 102.2 kg, SpO2 95%.  Constitutional:      General: He is not in acute distress. HENT:     Head: Normocephalic and atraumatic.     Right Ear: External ear normal.     Left Ear: External ear normal.     Nose: Nose normal.  Eyes:     Extraocular Movements: Extraocular movements intact.     Pupils: Pupils are equal, round, and reactive to light.  Cardiovascular:     Rate and Rhythm: Normal rate and regular rhythm.     Heart sounds: No murmur heard.    No gallop.   Pulmonary:     Effort: Pulmonary effort is normal. No respiratory distress.     Breath sounds: No wheezing or rales.  Abdominal:     General: Bowel sounds are normal. There is no distension.     Palpations: Abdomen is soft. NonTTP Musculoskeletal:        General: Normal range of motion.     Cervical back: Normal range of motion.     Comments: Right TKA with swelling, about 30-40 degrees of flexion. Left TKA well healed with at least 110 degrees flexion and full extension.   Skin:    Comments: Right knee with immediate postop dressing in place with scant blood. Left knee well healed.   Psychiatric:        Mood and Affect: Mood normal.        Behavior: Behavior normal.  PRIOR EXAMS: Neurological:     Mental Status: He is alert and oriented to person, place, and time.     Comments: Pt is alert,  oriented to person, place, month/year. Needed some help with day. Language is non-fluent but with extra time he is able to communicate needs with words and short sentences. Able to ID several objects in room with minimal pause. CN exam was non-focal. Mild right hemiparesis RUE: 4+/5, RLE 2+ prox to-4/5 (confounded by RTKA). LUE and LLE  4 to 5/5. No focal sensory loss. Cog wheeling rigidity with tremor noted in both RUE and RLE. Cog wheeling and tremor less on left side. Normal resting tone. DTR's trace to 1+    Assessment/Plan: 1. Functional deficits which require 3+ hours per day of interdisciplinary therapy in a comprehensive inpatient rehab setting. Physiatrist is providing close team supervision and 24 hour management of active medical problems listed below. Physiatrist and rehab team continue to assess barriers to discharge/monitor patient progress toward functional and medical goals  Care Tool:  Bathing              Bathing assist       Upper Body Dressing/Undressing Upper body dressing        Upper body assist      Lower Body Dressing/Undressing Lower body dressing             Lower body assist       Toileting Toileting    Toileting assist       Transfers Chair/bed transfer  Transfers assist           Locomotion Ambulation   Ambulation assist              Walk 10 feet activity   Assist           Walk 50 feet activity   Assist           Walk 150 feet activity   Assist           Walk 10 feet on uneven surface  activity   Assist           Wheelchair     Assist               Wheelchair 50 feet with 2 turns activity    Assist            Wheelchair 150 feet activity     Assist          Blood pressure 124/80, pulse 71, temperature 98.1 F (36.7 C), resp. rate 18, height 6' 1 (1.854 m), weight 102.2 kg, SpO2 95%.  Medical Problem List and Plan: 1. 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             -patient may shower -ELOS/Goals: 12-14 days, goals supervision with PT, OT and min-sup with SLP   -CIR evals today 01/16/23  2.  Antithrombotics: -DVT/anticoagulation:  Mechanical: Sequential compression devices, below knee Right lower extremity in addition to below.  -LE dopplers 01/13/23 were clear. -antiplatelet therapy: Aspirin  and Brilinta  for 30 days followed by aspirin  alone. Last dose of Brilinta : 02/11/2023.   3. Pain Management: Tylenol , Robaxin , oxycodone  as needed   4. Mood/Behavior/Sleep: LCSW to evaluate and provide emotional support             -continue Ambien  10 mg q HS             -continue Lexapro  20 daily             -antipsychotic agents: n/a  5. Neuropsych/cognition: This patient is capable of making decisions on his own behalf.   6. Skin/Wound Care: Routine skin care checks             -maintain Aquacel surgical dressing   7. Fluids/Electrolytes/Nutrition: Routine Is and Os and follow-up chemistries   8: Hypertension:  monitor TID and prn (see BPH meds below); also intermittent Hypotension, see #19             -continue Toprol  12.5 mg q HS  -01/16/23 BPs great, cont regimen Vitals:   01/15/23 1401 01/15/23 1954 01/16/23 0301  BP: 126/72 115/75 124/80     9: Hyperlipidemia: continue rosuvastatin  20mg  daily   10: BPH: continue Flomax  0.4mg  daily and Proscar  5mg  daily   11: Parkinson disease, possibly ET/PD:  -Sinemet  25/100, 2 tablets at 7 AM/2 tablets at 10 AM/2 at 1pm/2 at 4pm/2 at 7pm and 50/200 at bedtime -continue ropinirole  1 mg TID for rigidity -follows with Dr. Evonnie   12: Allergic rhinitis: continue Zaditor  gtts, Astelin  spray, Flonase , Claritin  10mg  daily   13: Asthma: continue Singulair  10mg  daily   14: History of CP, NSTEMI, CAD, RBBB and high burden of PVCs s/p Link monitor in past -continue mexiletine 150 mg BID -Plavix  discontinued and now on asa and Brilinta  -continue Crestor  20 mg daily -follows with Dr. Jeffrie   15: Mexiletine GERD/Barrett's esophagus: Protonix    16: Primary OA of right knee s/p TKA 01/11/2023 (previous left TKA)             -maintain zero degree knee bone foam 10-15 minutes TID             -range of motion in bed/with therapy   17: Mild ABLA: follow-up CBC  -01/16/23 Hgb 12.3, monitor routinely   18: Anti-constipation: continue colace 100 mg BID; prns ordered  -01/16/23 LBM last night, cont regimen   19: History of chronic intermittent hypotension:             -continue midodrine  5 mg BID for systolic BP < 100    LOS: 1 days A FACE TO FACE EVALUATION WAS PERFORMED  [address] 01/16/2023, 11:09 AM

## 2023-01-16 NOTE — Evaluation (Signed)
 Physical Therapy Assessment and Plan  Patient Details  Name: Joshua [name] MRN: [id number] Date of Birth: [birth year]  PT Diagnosis: Coordination disorder, Difficulty walking, Hemiparesis dominant, Muscle weakness, and Pain in RLE Rehab Potential: Good ELOS: 9-11 days   Today's Date: 01/16/2023 PT Individual Time: 0905-1020 PT Individual Time Calculation (min): 75 min    Hospital Problem: Principal Problem:   CVA (cerebral vascular accident) [facility])   Past Medical History:  Past Medical History:  Diagnosis Date   Allergic rhinitis    Anxiety    from chronic pain from surgery- on Cymbalta    Arthritis    Asthma    Barrett's esophagus 03/29/2014   Carotid artery disease (HCC)    Carotid Doppler normal August, 2007   Coronary artery disease    Diverticulosis    Dyslipidemia    GERD (gastroesophageal reflux disease)    History of loop recorder    has since 04/04/15   HTN (hypertension)    takes Metoprolol  for PVC control   Hx of colonic polyps    adenomatous   IFG (impaired fasting glucose)    Myocardial infarction (HCC)    mild   Neuromuscular disorder (HCC)    parkinson's tremors in hands   Palpitations    Benign PVCs   Parkinson disease (HCC)    Pneumonia    Prostate cancer (HCC)    RBBB (right bundle branch block)    rate related   Shingles    Stroke (HCC) 2017   TIA (transient ischemic attack) 02/2015   Per pt, had 2 strokes   Vertigo    Past Surgical History:  Past Surgical History:  Procedure Laterality Date   BACK SURGERY  2002,2009   x 6   CHOLECYSTECTOMY  11/30/2012   with IOC   COLONOSCOPY     ELECTROPHYSIOLOGIC STUDY N/A 09/26/2015   Procedure: V Tach Ablation (PVC);  Surgeon: Will Gladis Norton, MD;  Location: [facility] INVASIVE CV LAB;  Service: Cardiovascular;  Laterality: N/A;   EP IMPLANTABLE DEVICE N/A 04/04/2015   Procedure: Loop Recorder Insertion;  Surgeon: Will Gladis Norton, MD;  Location: [facility] INVASIVE CV LAB;  Service: Cardiovascular;   Laterality: N/A;   HERNIA REPAIR     laprascopic   KNEE SURGERY     DECEMBER 2017,LEFT KNEE SCOPED   LEFT HEART CATH AND CORONARY ANGIOGRAPHY N/A 02/05/2021   Procedure: LEFT HEART CATH AND CORONARY ANGIOGRAPHY;  Surgeon: Darron Deatrice LABOR, MD;  Location: [facility] INVASIVE CV LAB;  Service: Cardiovascular;  Laterality: N/A;   NECK SURGERY  2002   POLYPECTOMY     ROTATOR CUFF REPAIR Left    TEE WITHOUT CARDIOVERSION N/A 04/04/2015   Procedure: TRANSESOPHAGEAL ECHOCARDIOGRAM (TEE);  Surgeon: Redell GORMAN Shallow, MD;  Location: [facility] ENDOSCOPY;  Service: Cardiovascular;  Laterality: N/A;   TOTAL KNEE ARTHROPLASTY Left 09/28/2022   Procedure: TOTAL KNEE ARTHROPLASTY;  Surgeon: Edna Toribio LABOR, MD;  Location: [facility] ORS;  Service: Orthopedics;  Laterality: Left;   TOTAL KNEE ARTHROPLASTY Right 01/11/2023   Procedure: TOTAL KNEE ARTHROPLASTY;  Surgeon: Edna Toribio LABOR, MD;  Location: [facility] ORS;  Service: Orthopedics;  Laterality: Right;   TRIGGER FINGER RELEASE Left 12/28/2019   Procedure: RELEASE TRIGGER FINGER/A-1 PULLEY THUMB, MIDDLE AND RING;  Surgeon: Murrell Kuba, MD;  Location: [facility];  Service: Orthopedics;  Laterality: Left;  FAB   UPPER GASTROINTESTINAL ENDOSCOPY     V Tach ablation  09/26/2015    Assessment & Plan Clinical Impression: Patient is a 78 y.o.  male with history of Parkinson disease, prior TIAs/CVA in 2017 and end-stage right knee osteoarthritis who underwent right TKA on 01/11/2023 by Dr. Edna at [facility]. He developed acute onset of right sided weakness and aphasia after surgery in PACU. 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 He was taking Plavix  prior to admission and was started on aspirin  and Brilinta  for 4 weeks followed by aspirin  alone. 2-d echo with EF ~50-55%. A1c = 5.9%. BLE venous duplex negative for DVT. Patient will need 30-day heart monitor at discharge  to look for paroxysmal A-fib. Symptoms somewhat improved and he is tolerating CPM well. He was able to ambulate an increased distance of up to ~74 ft with a RW and minA 01/02. 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  Patient transferred to CIR on 01/15/2023 .   Patient currently requires min assist with mobility secondary to muscle weakness, decreased cardiorespiratoy endurance, impaired timing and sequencing, unbalanced muscle activation, and decreased coordination, decreased attention to right, decreased attention, decreased awareness, decreased safety awareness, decreased memory, and delayed processing, and decreased standing balance, decreased balance strategies, and hemipareisis .  Prior to hospitalization, patient was modified independent  with mobility and lived with Spouse Idell) in a House home.  Home access is  Level entry (level entry at garage; 1STE at front).  Patient will benefit from skilled PT intervention to maximize safe functional mobility, minimize fall risk, and decrease caregiver burden for planned discharge home with intermittent assist.  Anticipate patient will benefit from follow up OP at discharge.  PT - End of Session Activity Tolerance: Tolerates 30+ min activity with multiple rests Endurance Deficit: Yes PT Assessment Rehab Potential (ACUTE/IP ONLY): Good PT Barriers to Discharge: Decreased caregiver support;Wound Care;Lack of/limited family support;Insurance for SNF coverage;Weight bearing restrictions PT Patient demonstrates impairments in the following area(s): Balance;Edema;Endurance;Motor;Pain;Perception;Safety;Sensory;Skin Integrity PT Transfers Functional Problem(s): Bed to Chair;Car;Bed Mobility;Furniture PT Locomotion Functional Problem(s): Ambulation;Stairs PT Plan PT Intensity: Minimum of 1-2  x/day ,45 to 90 minutes PT Frequency: 5 out of 7 days PT Duration Estimated Length of Stay: 9-11 days PT Treatment/Interventions: Community reintegration;Ambulation/gait training;Balance/vestibular training;Cognitive remediation/compensation;Disease management/prevention;Discharge planning;DME/adaptive equipment instruction;Neuromuscular re-education;Psychosocial support;Stair training;UE/LE Strength taining/ROM;Pain management;Skin care/wound management;Therapeutic Activities;UE/LE Coordination activities;Visual/perceptual remediation/compensation;Therapeutic Exercise;Splinting/orthotics;Functional mobility training;Patient/family education PT Transfers Anticipated Outcome(s): Mod I PT Locomotion Anticipated Outcome(s): supervision PT Recommendation Recommendations for Other Services: Therapeutic Recreation consult Follow Up Recommendations: Outpatient PT;24 hour supervision/assistance Patient destination: Home Equipment Recommended: To be determined   PT Evaluation Precautions/Restrictions Precautions Precautions: Knee;Fall Precaution Comments: R TKA (need to rest in extension), R hemi/inattention, Expressive aphasia, CPM to R knee at least once daily Restrictions Weight Bearing Restrictions Per Provider Order: Yes RLE Weight Bearing Per Provider Order: Weight bearing as tolerated General   Vital Signs  Pain Pain Assessment Pain Scale: 0-10 Pain Score: 7  Pain Location: Knee Pain Orientation: Right Pain Interference Pain Interference Pain Effect on Sleep: 2. Occasionally Pain Interference with Therapy Activities: 3. Frequently (But has a high pain tolerance) Pain Interference with Day-to-Day Activities: 3. Frequently (But has a high pain tolerance) Home Living/Prior Functioning Home Living Available Help at Discharge: Family;Available PRN/intermittently (Wife unable to provide more than supervision; 2 daughters work as architectural technologist during day) Type of Home: House Home Access:  Level entry (level entry at garage; 1STE at front) Home Layout: One  level Bathroom Shower/Tub: Walk-in shower (Grab bars & detachable shower head.) Bathroom Toilet: Handicapped height Bathroom Accessibility: Yes  Lives With: Spouse Idell) Prior Function Level of Independence: Independent with basic ADLs;Independent with gait;Independent with transfers;Requires assistive device for independence Bath: Minimal (Back) Dressing: Minimal (Buttons & tie shoes)  Able to Take Stairs?: Yes Driving: Yes Vision/Perception  Vision - History Ability to See in Adequate Light: 1 Impaired Vision - Assessment Eye Alignment: Within Functional Limits Alignment/Gaze Preference: Within Defined Limits Tracking/Visual Pursuits: Decreased smoothness of vertical tracking;Decreased smoothness of horizontal tracking Saccades: Additional eye shifts occurred during testing Diplopia Assessment: Objects split on top of one another;Present all the time/all directions Perception Perception: Impaired Preception Impairment Details: Inattention/Neglect Praxis Praxis: WFL  Cognition Overall Cognitive Status: Impaired/Different from baseline Arousal/Alertness: Awake/alert Orientation Level: Oriented X4 Year: 2025 Month: January Day of Week: Incorrect Attention: Sustained Sustained Attention: Impaired Sustained Attention Impairment: Verbal basic;Functional basic Memory: Impaired Memory Impairment: Storage deficit;Retrieval deficit;Decreased short term memory Decreased Short Term Memory: Verbal basic;Functional basic Awareness: Impaired Awareness Impairment: Emergent impairment Problem Solving: Impaired Problem Solving Impairment: Verbal basic;Functional basic Executive Function: Self Monitoring Self Monitoring: Impaired Safety/Judgment: Impaired Sensation Sensation Light Touch: Impaired Detail Peripheral sensation comments: RUE numbness (reports present for ~3 months PTA), reports numbness/tingling in  BLE. Light Touch Impaired Details: Impaired RUE;Impaired LLE;Impaired RLE Coordination Gross Motor Movements are Fluid and Coordinated: No Fine Motor Movements are Fluid and Coordinated: No Heel Shin Test: slow to perform with LLE; unable on RLE d/t s/p R TKA Motor  Motor Motor: Hemiplegia Motor - Skilled Clinical Observations: R hemipareisis with RUE more affected than RLE, RUE>RLE decreased coordination, reduced motor control   Trunk/Postural Assessment  Cervical Assessment Cervical Assessment: Exceptions to [facility] (forward head) Thoracic Assessment Thoracic Assessment: Exceptions to [facility] (rounded shoulders with mild kyphosis) Lumbar Assessment Lumbar Assessment: Exceptions to [facility] (postierior pelvic tilt) Postural Control Postural Control: Within Functional Limits  Balance Static Sitting Balance Static Sitting - Balance Support: Feet supported;Bilateral upper extremity supported Static Sitting - Level of Assistance: 7: Independent Dynamic Sitting Balance Dynamic Sitting - Balance Support: Feet supported;Bilateral upper extremity supported;During functional activity Dynamic Sitting - Level of Assistance: 5: Stand by assistance Static Standing Balance Static Standing - Balance Support: Bilateral upper extremity supported;During functional activity Static Standing - Level of Assistance: 4: Min assist Dynamic Standing Balance Dynamic Standing - Balance Support: Bilateral upper extremity supported;During functional activity Dynamic Standing - Level of Assistance: 4: Min assist;5: Stand by assistance Extremity Assessment      RLE Assessment RLE Assessment: Exceptions to [facility] RLE Strength Right Hip Flexion: 3+/5 Right Hip Extension: 4-/5 Right Hip ABduction: 4-/5 Right Hip ADduction: 4/5 Right Knee Flexion: 4-/5 Right Knee Extension: 4-/5 Right Ankle Dorsiflexion: 5/5 Right Ankle Plantar Flexion: 5/5 LLE Assessment LLE Assessment: Within Functional Limits General Strength  Comments: 5/5 grossly  Care Tool Care Tool Bed Mobility Roll left and right activity   Roll left and right assist level: Independent with assistive device    Sit to lying activity   Sit to lying assist level: Contact Guard/Touching assist    Lying to sitting on side of bed activity   Lying to sitting on side of bed assist level: the ability to move from lying on the back to sitting on the side of the bed with no back support.: Contact Guard/Touching assist     Care Tool Transfers Sit to stand transfer   Sit to stand assist level: Minimal Assistance - Patient > 75%  Chair/bed transfer   Chair/bed transfer assist level: Minimal Assistance - Patient > 75%    Careers Adviser transfer assist level: Minimal Assistance - Patient > 75%      Care Tool Locomotion Ambulation   Assist level: Minimal Assistance - Patient > 75% Assistive device: Walker-rolling Max distance: 200 ft  Walk 10 feet activity   Assist level: Contact Guard/Touching assist Assistive device: Walker-rolling   Walk 50 feet with 2 turns activity   Assist level: Contact Guard/Touching assist Assistive device: Walker-rolling  Walk 150 feet activity   Assist level: Minimal Assistance - Patient > 75% Assistive device: Walker-rolling  Walk 10 feet on uneven surfaces activity Walk 10 feet on uneven surfaces activity did not occur: Safety/medical concerns      Stairs   Assist level: Minimal Assistance - Patient > 75% Stairs assistive device: 2 hand rails Max number of stairs: 4  Walk up/down 1 step activity   Walk up/down 1 step (curb) assist level: Contact Guard/Touching assist    Walk up/down 4 steps activity   Walk up/down 4 steps assist level: Minimal Assistance - Patient > 75% Walk up/down 4 steps assistive device: 2 hand rails  Walk up/down 12 steps activity Walk up/down 12 steps activity did not occur: Safety/medical concerns      Pick up small objects from floor   Pick up small object from the  floor assist level: Moderate Assistance - Patient 50 - 74%    Wheelchair Is the patient using a wheelchair?: Yes Type of Wheelchair: Manual   Wheelchair assist level: Dependent - Patient 0% Max wheelchair distance: 200 ft  Wheel 50 feet with 2 turns activity   Assist Level: Dependent - Patient 0%  Wheel 150 feet activity   Assist Level: Dependent - Patient 0%    Refer to Care Plan for Long Term Goals  SHORT TERM GOAL WEEK 1 PT Short Term Goal 1 (Week 1): STG = LTG d/t ELOS  Recommendations for other services: Therapeutic Recreation  Stress management and Outing/community reintegration  Skilled Therapeutic Intervention Mobility Bed Mobility Bed Mobility: Rolling Left;Rolling Right;Supine to Sit;Sit to Supine Rolling Right: Independent with assistive device Rolling Left: Independent with assistive device Supine to Sit: Contact Guard/Touching assist Sit to Supine: Contact Guard/Touching assist Transfers Transfers: Sit to Stand;Stand to Sit;Stand Pivot Transfers Sit to Stand: Minimal Assistance - Patient > 75% Stand to Sit: Minimal Assistance - Patient > 75% Stand Pivot Transfers: Minimal Assistance - Patient > 75% Stand Pivot Transfer Details: Verbal cues for safe use of DME/AE;Verbal cues for precautions/safety;Verbal cues for technique Transfer (Assistive device): Rolling walker Locomotion  Gait Ambulation: Yes Gait Assistance: Minimal Assistance - Patient > 75%;Contact Guard/Touching assist Gait Distance (Feet): 200 Feet Assistive device: Rolling walker Gait Assistance Details: Verbal cues for technique;Verbal cues for precautions/safety;Verbal cues for safe use of DME/AE Gait Gait: Yes Gait Pattern: Impaired Gait Pattern: Antalgic;Decreased dorsiflexion - right;Decreased hip/knee flexion - right;Step-through pattern Gait velocity: reduced Stairs / Additional Locomotion Stairs: Yes Stairs Assistance: Minimal Assistance - Patient > 75% Stair Management Technique: Two  rails Number of Stairs: 4 Height of Stairs: 6 Wheelchair Mobility Wheelchair Mobility: No  Skilled Intervention: PT Evaluation completed; see above for results. PT educated patient in roles of PT vs OT, PT POC, rehab potential, rehab goals, and discharge recommendations along with recommendation for follow-up rehabilitation services. Individual treatment initiated:  Patient supine in bed upon PT arrival. Patient alert and agreeable to PT session. No pain complaint at start  of session. However pt does relate that he has a high tolerance to pain. Has not received morning medications yet per pt.   Proper sized w/c acquired for pt with R elevating leg rest. Pt has saddle splint in room for R walker grip.   Therapeutic Activity: Bed Mobility: Patient performed supine <> sit with overall supervision/ CGA. Provided vc for technique. Transfers: Patient performed sit <> stand and stand pivot transfers with CGA from slightly elevated bed. From more standard height seats, pt requires light MinA and vc for forward RLE positioning prior to transfers as well as for proper R hand positioning d/t reduced awareness of RUE.  Car transfer performed to pt stated seat height similar to van that will be used to take pt home on d/c. Pt attempts to lift LLE to step into footwell of vehicle but educated pt to turn to sit first and then bring BLE into footwell for increased safety. Pt then able to perform with CGA and uses BUE to assist RLE into car. Educated pt to scoot to L to provide more space to bring RLE out of car with need for less flexion and pain mgmt.   Gait Training:  Patient ambulated 175' x1/ 160' x1 using RW with CGA/ MinA. Demonstrated good stride length with RLE demonstrating reduced hip/ knee flexion compared to LLE. Pt relates heavy BUE pressure into RW. No vc required.   Pt guided in stair training with physical demonstration and verbal instructions provided prior to performance. Pt is able to  complete four 6 steps using BHR with CGA/ MinA to ascend leading with LLE. To descend, Pt requires heavy vc/ tc and MinA to ensure RLE heel clearance of step and MinA leading with RLE. VC provided at initiation of each direction for leading LE.    Neuromuscular Re-ed: NMR facilitated during session with focus on standing balance and motor control. Pt guided in toe touches of RLE to 6 step and is able to complete x10 with MinA initially and improves to supervision/ CGA with BUE to HR. NMR performed for improvements in motor control and coordination, balance, sequencing, judgement, and self confidence/ efficacy in performing all aspects of mobility at highest level of independence.   Patient seated on toilet at end of session and care handed to NT.  Discharge Criteria: Patient will be discharged from PT if patient refuses treatment 3 consecutive times without medical reason, if treatment goals not met, if there is a change in medical status, if patient makes no progress towards goals or if patient is discharged from hospital.  The above assessment, treatment plan, treatment alternatives and goals were discussed and mutually agreed upon: by patient  Mliss DELENA Milliner PT, DPT, CSRS 01/16/2023, 1:57 PM

## 2023-01-16 NOTE — Evaluation (Signed)
 Occupational Therapy Assessment and Plan  Patient Details  Name: Joshua [name] MRN: [id number] Date of Birth: Oct 29, 1945  OT Diagnosis: acute pain, muscle weakness (generalized), pain in joint, and hemiparesis affecting dominant side, and decreased activity tolerance Rehab Potential: Rehab Potential (ACUTE ONLY): Good ELOS: 10-12 days   Today's Date: 01/16/2023 OT Individual Time: 8694-8584 OT Individual Time Calculation (min): 70 min     Hospital Problem: Principal Problem:   CVA (cerebral vascular accident) [facility])   Past Medical History:  Past Medical History:  Diagnosis Date   Allergic rhinitis    Anxiety    from chronic pain from surgery- on Cymbalta    Arthritis    Asthma    Barrett's esophagus 03/29/2014   Carotid artery disease (HCC)    Carotid Doppler normal August, 2007   Coronary artery disease    Diverticulosis    Dyslipidemia    GERD (gastroesophageal reflux disease)    History of loop recorder    has since 04/04/15   HTN (hypertension)    takes Metoprolol  for PVC control   Hx of colonic polyps    adenomatous   IFG (impaired fasting glucose)    Myocardial infarction (HCC)    mild   Neuromuscular disorder (HCC)    parkinson's tremors in hands   Palpitations    Benign PVCs   Parkinson disease (HCC)    Pneumonia    Prostate cancer (HCC)    RBBB (right bundle branch block)    rate related   Shingles    Stroke (HCC) 2017   TIA (transient ischemic attack) 02/2015   Per pt, had 2 strokes   Vertigo    Past Surgical History:  Past Surgical History:  Procedure Laterality Date   BACK SURGERY  2002,2009   x 6   CHOLECYSTECTOMY  11/30/2012   with IOC   COLONOSCOPY     ELECTROPHYSIOLOGIC STUDY N/A 09/26/2015   Procedure: V Tach Ablation (PVC);  Surgeon: Will Gladis Norton, MD;  Location: [facility];  Service: Cardiovascular;  Laterality: N/A;   EP IMPLANTABLE DEVICE N/A 04/04/2015   Procedure: Loop Recorder Insertion;  Surgeon: Will Gladis Norton, MD;  Location: [facility];  Service: Cardiovascular;  Laterality: N/A;   HERNIA REPAIR     laprascopic   KNEE SURGERY     DECEMBER 2017,LEFT KNEE SCOPED   LEFT HEART CATH AND CORONARY ANGIOGRAPHY N/A 02/05/2021   Procedure: LEFT HEART CATH AND CORONARY ANGIOGRAPHY;  Surgeon: Darron Deatrice LABOR, MD;  Location: [facility];  Service: Cardiovascular;  Laterality: N/A;   NECK SURGERY  2002   POLYPECTOMY     ROTATOR CUFF REPAIR Left    TEE WITHOUT CARDIOVERSION N/A 04/04/2015   Procedure: TRANSESOPHAGEAL ECHOCARDIOGRAM (TEE);  Surgeon: Redell GORMAN Shallow, MD;  Location: [facility] ENDOSCOPY;  Service: Cardiovascular;  Laterality: N/A;   TOTAL KNEE ARTHROPLASTY Left 09/28/2022   Procedure: TOTAL KNEE ARTHROPLASTY;  Surgeon: Edna Toribio LABOR, MD;  Location: [facility] ORS;  Service: Orthopedics;  Laterality: Left;   TOTAL KNEE ARTHROPLASTY Right 01/11/2023   Procedure: TOTAL KNEE ARTHROPLASTY;  Surgeon: Edna Toribio LABOR, MD;  Location: [facility] ORS;  Service: Orthopedics;  Laterality: Right;   TRIGGER FINGER RELEASE Left 12/28/2019   Procedure: RELEASE TRIGGER FINGER/A-1 PULLEY THUMB, MIDDLE AND RING;  Surgeon: Murrell Kuba, MD;  Location: [facility];  Service: Orthopedics;  Laterality: Left;  FAB   UPPER GASTROINTESTINAL ENDOSCOPY     V Tach ablation  09/26/2015  Assessment & Plan Clinical Impression: Patient is a 78 year old male with history of Parkinson disease, prior TIAs/CVA in 2017 and end-stage right knee osteoarthritis who underwent right TKA on 01/11/2023 by Dr. Edna at [facility]. He developed acute onset of right sided weakness and aphasia after surgery in PACU. 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 He was taking Plavix  prior to admission and was started on aspirin  and Brilinta  for 4 weeks followed by aspirin  alone. 2-d echo with EF ~50-55%. A1c = 5.9%. BLE venous  duplex negative for DVT. Patient will need 30-day heart monitor at discharge to look for paroxysmal A-fib. Symptoms somewhat improved and he is tolerating CPM well. He was able to ambulate an increased distance of up to ~74 ft with a RW and minA 01/02. 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 Patient transferred to CIR on 01/15/2023 .    Patient currently requires Min-Mod A with basic self-care skills secondary to muscle weakness and muscle joint tightness, decreased cardiorespiratoy endurance, unbalanced muscle activation and decreased coordination, decreased attention to right, decreased awareness, decreased safety awareness, and decreased memory, and decreased standing balance, decreased postural control, and decreased balance strategies.  Prior to hospitalization, patient could complete BADLs with Min A for spouse.   Patient will benefit from skilled intervention to increase independence with basic self-care skills prior to discharge home with care partner.  Anticipate patient will require 24 hour supervision and follow up outpatient.  OT - End of Session Activity Tolerance: Tolerates 30+ min activity with multiple rests Endurance Deficit: Yes OT Assessment Rehab Potential (ACUTE ONLY): Good OT Barriers to Discharge: Incontinence;Wound Care;Weight bearing restrictions OT Patient demonstrates impairments in the following area(s): Balance;Cognition;Edema;Endurance;Motor;Pain;Perception;Safety;Sensory;Skin Integrity;Vision OT Basic ADL's Functional Problem(s): Eating;Grooming;Bathing;Dressing;Toileting OT Transfers Functional Problem(s): Toilet;Tub/Shower OT Additional Impairment(s): Fuctional Use of Upper Extremity OT Plan OT Intensity: Minimum of 1-2 x/day, 45 to 90 minutes OT Frequency: 5 out of 7 days OT Duration/Estimated  Length of Stay: 7-10 days OT Treatment/Interventions: Balance/vestibular training;Community reintegration;Cognitive remediation/compensation;Discharge planning;Disease mangement/prevention;DME/adaptive equipment instruction;Functional electrical stimulation;Functional mobility training;Neuromuscular re-education;Pain management;Patient/family education;Psychosocial support;Self Care/advanced ADL retraining;Skin care/wound managment;Splinting/orthotics;Therapeutic Activities;Therapeutic Exercise;UE/LE Strength taining/ROM;UE/LE Coordination activities;Visual/perceptual remediation/compensation;Wheelchair propulsion/positioning OT Self Feeding Anticipated Outcome(s): Mod I OT Basic Self-Care Anticipated Outcome(s): Supervision OT Toileting Anticipated Outcome(s): Supervision OT Bathroom Transfers Anticipated Outcome(s): Supervision OT Recommendation Patient destination: Home Follow Up Recommendations: Outpatient OT Equipment Recommended: To be determined   OT Evaluation Precautions/Restrictions  Precautions Precautions: Knee;Fall Precaution Comments: R TKA (need to rest in extension), R hemi/inattention, Expressive aphasia, CPM to R knee at least once daily Restrictions Weight Bearing Restrictions Per Provider Order: Yes RLE Weight Bearing Per Provider Order: Weight bearing as tolerated General Chart Reviewed: Yes Family/Caregiver Present: No Pain Pain Assessment Pain Scale: 0-10 Pain Score: 7  Pain Location: Knee Pain Orientation: Right Home Living/Prior Functioning Home Living Family/patient expects to be discharged to:: Private residence Living Arrangements: Spouse/significant other Available Help at Discharge: Family, Available PRN/intermittently (Wife unable to provide more than supervision; 2 daughters work as architectural technologist during day) Type of Home: House Home Access: Level entry (level entry at garage; 1STE at front) Home Layout: One level Bathroom Shower/Tub: Walk-in shower  (Grab bars & detachable shower head.) Bathroom Toilet: Handicapped height Bathroom Accessibility: Yes  Lives With: Spouse Idell) IADL History Homemaking Responsibilities: No Current License: Yes  Occupation: Retired Leisure and Hobbies: Yardwork & spending time with friends Prior Function Level of Independence: Independent with basic ADLs, Independent with gait, Independent with transfers, Requires assistive device for independence Bath: Minimal (Back) Dressing: Minimal (Buttons & tie shoes)  Able to Take Stairs?: Yes Driving: Yes Vision Baseline Vision/History: 1 Wears glasses (Bifocals) Ability to See in Adequate Light: 1 Impaired Patient Visual Report: Diplopia Vision Assessment?: Vision impaired- to be further tested in functional context;Yes Eye Alignment: Within Functional Limits Ocular Range of Motion: Within Functional Limits Alignment/Gaze Preference: Within Defined Limits Tracking/Visual Pursuits: Decreased smoothness of vertical tracking;Decreased smoothness of horizontal tracking Saccades: Additional eye shifts occurred during testing;Decreased speed of saccadic movement Convergence: Impaired (comment) Visual Fields: Right visual field deficit Diplopia Assessment: Objects split on top of one another;Present all the time/all directions Perception  Perception: Impaired Praxis Praxis: WFL Cognition Cognition Overall Cognitive Status: Impaired/Different from baseline Arousal/Alertness: Awake/alert Orientation Level: Person;Place;Situation Memory: Impaired Memory Impairment: Storage deficit;Retrieval deficit;Decreased short term memory Decreased Short Term Memory: Verbal basic;Functional basic Attention: Sustained Sustained Attention: Impaired Sustained Attention Impairment: Verbal basic;Functional basic Awareness: Impaired Awareness Impairment: Emergent impairment Problem Solving: Impaired Problem Solving Impairment: Verbal basic;Functional basic Executive  Function: Self Monitoring Self Monitoring: Impaired Safety/Judgment: Impaired Brief Interview for Mental Status (BIMS) Repetition of Three Words (First Attempt): 3 Temporal Orientation: Year: Correct Temporal Orientation: Month: Accurate within 5 days Temporal Orientation: Day: Correct Recall: Sock: No, could not recall Recall: Blue: Yes, no cue required Recall: Bed: No, could not recall BIMS Summary Score: 11 Sensation Sensation Light Touch: Impaired Detail Peripheral sensation comments: RUE numbness (reports present for ~3 months PTA), reports numbness/tingling in BLE. Light Touch Impaired Details: Impaired RUE;Impaired LLE;Impaired RLE Coordination Gross Motor Movements are Fluid and Coordinated: No Fine Motor Movements are Fluid and Coordinated: No Heel Shin Test: slow to perform with LLE; unable on RLE d/t s/p R TKA Motor  Motor Motor: Hemiplegia Motor - Skilled Clinical Observations: R hemipareisis with RUE more affected than RLE, RUE>RLE decreased coordination, reduced motor control  Trunk/Postural Assessment  Cervical Assessment Cervical Assessment: Exceptions to [facility] (forward head) Thoracic Assessment Thoracic Assessment: Exceptions to [facility] (rounded shoulders with mild kyphosis) Lumbar Assessment Lumbar Assessment: Exceptions to [facility] (postierior pelvic tilt) Postural Control Postural Control: Within Functional Limits  Balance Static Sitting Balance Static Sitting - Balance Support: Feet supported;Bilateral upper extremity supported Static Sitting - Level of Assistance: 7: Independent Dynamic Sitting Balance Dynamic Sitting - Balance Support: Feet supported;Bilateral upper extremity supported;During functional activity Dynamic Sitting - Level of Assistance: 5: Stand by assistance (SUP) Dynamic Sitting - Balance Activities: Lateral lean/weight shifting;Forward lean/weight shifting Static Standing Balance Static Standing - Balance Support: Bilateral upper extremity  supported;During functional activity Static Standing - Level of Assistance: 4: Min assist Dynamic Standing Balance Dynamic Standing - Balance Support: Bilateral upper extremity supported;During functional activity Dynamic Standing - Level of Assistance: 4: Min assist Dynamic Standing - Balance Activities: Lateral lean/weight shifting;Forward lean/weight shifting;Reaching for objects Extremity/Trunk Assessment RUE Assessment RUE Assessment: Exceptions to [facility] Active Range of Motion (AROM) Comments: WFL General Strength Comments: 3+/5 proximally; tremors RUE Body System: Neuro Brunstrum levels for arm and hand: Arm;Hand Brunstrum level for arm: Stage V Relative Independence from Synergy Brunstrum level for hand: Stage V Independence from basic synergies LUE Assessment LUE Assessment: Exceptions to [facility] Active Range of Motion (AROM) Comments: WFL General Strength Comments: WFL; tremors  Care Tool Care Tool Self Care Eating   Eating Assist Level: Minimal Assistance - Patient > 75%  Oral Care    Oral Care Assist Level: Minimal Assistance - Patient > 75%    Bathing   Body parts bathed by patient: Right arm;Chest;Abdomen;Front perineal area;Right upper leg;Left upper leg;Face;Left arm;Right lower leg;Left lower leg Body parts bathed by helper: Buttocks;Right lower leg;Left lower leg   Assist Level: Minimal Assistance - Patient > 75%    Upper Body Dressing(including orthotics)   What is the patient wearing?: Pull over shirt   Assist Level: Minimal Assistance - Patient > 75%    Lower Body Dressing (excluding footwear)   What is the patient wearing?: Incontinence brief;Pants Assist for lower body dressing: Moderate Assistance - Patient 50 - 74%    Putting on/Taking off footwear   What is the patient wearing?: Non-skid slipper socks Assist for footwear: Total Assistance - Patient < 25%       Care Tool Toileting Toileting activity Toileting Activity did not occur (Clothing  management and hygiene only): N/A (no void or bm)       Care Tool Bed Mobility Roll left and right activity   Roll left and right assist level: Independent with assistive device    Sit to lying activity   Sit to lying assist level: Contact Guard/Touching assist    Lying to sitting on side of bed activity   Lying to sitting on side of bed assist level: the ability to move from lying on the back to sitting on the side of the bed with no back support.: Contact Guard/Touching assist     Care Tool Transfers Sit to stand transfer   Sit to stand assist level: Minimal Assistance - Patient > 75%    Chair/bed transfer   Chair/bed transfer assist level: Minimal Assistance - Patient > 75%     Toilet transfer   Assist Level: Minimal Assistance - Patient > 75%     Care Tool Cognition  Expression of Ideas and Wants Expression of Ideas and Wants: 3. Some difficulty - exhibits some difficulty with expressing needs and ideas (e.g, some words or finishing thoughts) or speech is not clear  Understanding Verbal and Non-Verbal Content Understanding Verbal and Non-Verbal Content: 3. Usually understands - understands most conversations, but misses some part/intent of message. Requires cues at times to understand   Memory/Recall Ability Memory/Recall Ability : Current season;That he or she is in a hospital/hospital unit   Refer to Care Plan for Long Term Goals  SHORT TERM GOAL WEEK 1 OT Short Term Goal 1 (Week 1): STGs=LTGs due to patient's estimated length of stay.  Recommendations for other services: None    Skilled Therapeutic Intervention Pt greeted resting in bed for skilled OT session with focus on comprehensive OT evaluation.   Pain: Pt pain reported above.  Session began with introduction to OT role, OT POC, and general orientation to rehab unit/schedule. Pt completes full-body sponge-bathing and dressing with levels of assistance noted below. Pt requires moderate levels of cuing for  attention to R-hand grip, despite decent digital strength. Pt demos decreased safety awareness attempting to stand without RW and hands incorrectly positioned.   Pt remained resting in bed with 4Ps assessed and immediate needs met. Pt continues to be appropriate for skilled OT intervention to promote further functional independence in ADLs/IADLs.   ADL ADL Eating: Minimal assistance Where Assessed-Eating: Bed level Grooming: Setup;Minimal assistance Where Assessed-Grooming: Sitting at sink Upper Body Bathing: Minimal assistance Where Assessed-Upper Body Bathing: Sitting at sink Lower Body Bathing: Moderate assistance Where Assessed-Lower Body Bathing: Sitting at sink Upper  Body Dressing: Minimal assistance Where Assessed-Upper Body Dressing: Sitting at sink Lower Body Dressing: Moderate assistance Where Assessed-Lower Body Dressing: Sitting at sink Toileting: Moderate assistance Where Assessed-Toileting: Toilet;Bedside Commode Toilet Transfer: Minimal assistance;Contact guard Toilet Transfer Method: Proofreader: Bedside commode;Other (comment) (RW) Tub/Shower Transfer: Unable to assess Film/video Editor: Unable to assess Mobility  Bed Mobility Bed Mobility: Rolling Left;Rolling Right;Supine to Sit;Sit to Supine Rolling Right: Independent with assistive device Rolling Left: Independent with assistive device Supine to Sit: Contact Guard/Touching assist Sit to Supine: Contact Guard/Touching assist Transfers Sit to Stand: Minimal Assistance - Patient > 75% Stand to Sit: Minimal Assistance - Patient > 75%   Discharge Criteria: Patient will be discharged from OT if patient refuses treatment 3 consecutive times without medical reason, if treatment goals not met, if there is a change in medical status, if patient makes no progress towards goals or if patient is discharged from hospital.  The above assessment, treatment plan, treatment alternatives and  goals were discussed and mutually agreed upon: by patient  Nereida Habermann, OTR/L, MSOT  01/16/2023, 2:22 PM

## 2023-01-16 NOTE — Plan of Care (Signed)
  Problem: RH Balance Goal: LTG Patient will maintain dynamic standing balance (PT) Description: LTG:  Patient will maintain dynamic standing balance with assistance during mobility activities (PT) Flowsheets (Taken 01/16/2023 1346) LTG: Pt will maintain dynamic standing balance during mobility activities with:: Independent with assistive device    Problem: Sit to Stand Goal: LTG:  Patient will perform sit to stand with assistance level (PT) Description: LTG:  Patient will perform sit to stand with assistance level (PT) Flowsheets (Taken 01/16/2023 1346) LTG: PT will perform sit to stand in preparation for functional mobility with assistance level:  Supervision/Verbal cueing  Independent   Problem: RH Bed Mobility Goal: LTG Patient will perform bed mobility with assist (PT) Description: LTG: Patient will perform bed mobility with assistance, with/without cues (PT). Flowsheets (Taken 01/16/2023 1346) LTG: Pt will perform bed mobility with assistance level of: Independent   Problem: RH Car Transfers Goal: LTG Patient will perform car transfers with assist (PT) Description: LTG: Patient will perform car transfers with assistance (PT). Flowsheets (Taken 01/16/2023 1349) LTG: Pt will perform car transfers with assist:: Supervision/Verbal cueing   Problem: RH Furniture Transfers Goal: LTG Patient will perform furniture transfers w/assist (OT/PT) Description: LTG: Patient will perform furniture transfers  with assistance (OT/PT). Flowsheets (Taken 01/16/2023 1349) LTG: Pt will perform furniture transfers with assist:: Supervision/Verbal cueing   Problem: RH Ambulation Goal: LTG Patient will ambulate in controlled environment (PT) Description: LTG: Patient will ambulate in a controlled environment, # of feet with assistance (PT). Flowsheets (Taken 01/16/2023 1349) LTG: Pt will ambulate in controlled environ  assist needed:: Supervision/Verbal cueing LTG: Ambulation distance in controlled  environment: at least 300 ft using LRAD Goal: LTG Patient will ambulate in home environment (PT) Description: LTG: Patient will ambulate in home environment, # of feet with assistance (PT). Flowsheets (Taken 01/16/2023 1349) LTG: Ambulation distance in home environment: up to 50 ft per bout using LRAD   Problem: RH Stairs Goal: LTG Patient will ambulate up and down stairs w/assist (PT) Description: LTG: Patient will ambulate up and down # of stairs with assistance (PT) Flowsheets (Taken 01/16/2023 1349) LTG: Pt will ambulate up/down stairs assist needed:: Supervision/Verbal cueing LTG: Pt will  ambulate up and down number of stairs: at least 1 step using LRAD in order to navigate in community   Problem: RH Bed to Chair Transfers Goal: LTG Patient will perform bed/chair transfers w/assist (PT) Description: LTG: Patient will perform bed to chair transfers with assistance (PT). Flowsheets (Taken 01/16/2023 1349) LTG: Pt will perform Bed to Chair Transfers with assistance level: Supervision/Verbal cueing

## 2023-01-16 NOTE — Progress Notes (Signed)
 Orthopedic Tech Progress Note Patient Details:  Kaulana Brindle 23-Apr-1945 [id number]  Pt was eating dinner when I arrived to put the CPM on, he would like it on once he is finished. Spoke to the RN for his room that said he will place him in the CPM at 0-90 before shift change.   Patient ID: Westin Knotts, male   DOB: Jul 04, 1945, 78 y.o.   MRN: [id number]  Tinnie Ronal Brasil 01/16/2023, 6:30 PM

## 2023-01-16 NOTE — Plan of Care (Signed)
  Problem: RH Expression Communication Goal: LTG Patient will verbally express basic/complex needs(SLP) Description: LTG:  Patient will verbally express basic/complex needs, wants or ideas with cues  (SLP) Flowsheets (Taken 01/16/2023 1237) LTG: Patient will verbally express basic/complex needs, wants or ideas (SLP): Minimal Assistance - Patient > 75% Goal: LTG Patient will increase word finding of common (SLP) Description: LTG:  Patient will increase word finding of common objects/daily info/abstract thoughts with cues using compensatory strategies (SLP). Flowsheets (Taken 01/16/2023 1237) LTG: Patient will increase word finding of common (SLP): Minimal Assistance - Patient > 75% Patient will use compensatory strategies to increase word finding of: Abstract thoughts   Problem: RH Problem Solving Goal: LTG Patient will demonstrate problem solving for (SLP) Description: LTG:  Patient will demonstrate problem solving for basic/complex daily situations with cues  (SLP) Flowsheets (Taken 01/16/2023 1237) LTG: Patient will demonstrate problem solving for (SLP): Basic daily situations LTG Patient will demonstrate problem solving for: Minimal Assistance - Patient > 75%   Problem: RH Memory Goal: LTG Patient will use memory compensatory aids to (SLP) Description: LTG:  Patient will use memory compensatory aids to recall biographical/new, daily complex information with cues (SLP) Flowsheets (Taken 01/16/2023 1237) LTG: Patient will use memory compensatory aids to (SLP): Minimal Assistance - Patient > 75%   Problem: RH Attention Goal: LTG Patient will demonstrate this level of attention during functional activites (SLP) Description: LTG:  Patient will will demonstrate this level of attention during functional activites (SLP) Flowsheets (Taken 01/16/2023 1237) Patient will demonstrate during cognitive/linguistic activities the attention type of: Sustained LTG: Patient will demonstrate this level of attention  during cognitive/linguistic activities with assistance of (SLP): Minimal Assistance - Patient > 75%   Problem: RH Awareness Goal: LTG: Patient will demonstrate awareness during functional activites type of (SLP) Description: LTG: Patient will demonstrate awareness during functional activites type of (SLP) Flowsheets (Taken 01/16/2023 1237) Patient will demonstrate during cognitive/linguistic activities awareness type of: Emergent LTG: Patient will demonstrate awareness during cognitive/linguistic activities with assistance of (SLP): Minimal Assistance - Patient > 75%

## 2023-01-17 DIAGNOSIS — I63412 Cerebral infarction due to embolism of left middle cerebral artery: Secondary | ICD-10-CM | POA: Diagnosis not present

## 2023-01-17 DIAGNOSIS — I10 Essential (primary) hypertension: Secondary | ICD-10-CM | POA: Diagnosis not present

## 2023-01-17 DIAGNOSIS — D62 Acute posthemorrhagic anemia: Secondary | ICD-10-CM | POA: Diagnosis not present

## 2023-01-17 NOTE — Progress Notes (Signed)
 Patient placed on CPM by day shift RN. CPM stopped at 2215 after 2 hours of treatment. Patient tolerated well. Ice pack applied post treatment.

## 2023-01-17 NOTE — Progress Notes (Signed)
 PROGRESS NOTE   Subjective/Complaints:  Pt doing great again, slept well, pain well managed, LBM yesterday, urinating fine, denies any other complaints or concerns today.  ROS: as per HPI. Denies CP, SOB, abd pain, N/V/D/C, or any other complaints at this time.    Objective:   No results found. Recent Labs    01/16/23 0612  WBC 8.2  HGB 12.3*  HCT 37.6*  PLT 252   Recent Labs    01/16/23 0612  NA 136  K 4.2  CL 104  CO2 22  GLUCOSE 107*  BUN 19  CREATININE 0.85  CALCIUM  9.0      Intake/Output Summary (Last 24 hours) at 01/17/2023 1042 Last data filed at 01/17/2023 9180 Gross per 24 hour  Intake 860 ml  Output 600 ml  Net 260 ml     Pressure Injury 01/15/23 Thigh Anterior;Right Stage 1 -  Intact skin with non-blanchable redness of a localized area usually over a bony prominence. long reddened area likely from contact with ice pack or ted hose (Active)  01/15/23 1357  Location: Thigh  Location Orientation: Anterior;Right  Staging: Stage 1 -  Intact skin with non-blanchable redness of a localized area usually over a bony prominence.  Wound Description (Comments): long reddened area likely from contact with ice pack or ted hose  Present on Admission: Yes    Physical Exam: Vital Signs Blood pressure (!) 145/77, pulse 76, temperature 97.6 F (36.4 C), temperature source Oral, resp. rate 18, height 6' 1 (1.854 m), weight 102.2 kg, SpO2 98%.  Constitutional: He is not in acute distress. Resting comfortably in bed.  HENT: NCAT  Eyes: EOMI, PERRL Cardiovascular: RRR, no m/r/g appreciated Pulmonary: CTAB, no w/r/r, normal effort, no respiratory distress Abdominal: soft, NTND, +BS throughout-a little hypoactive but still present Musculoskeletal: Right TKA with swelling, about 30-40 degrees of flexion. Left TKA well healed with at least 110 degrees flexion and full extension.   Skin: Right knee with immediate  postop dressing in place with scant blood. Left knee well healed.   Psychiatric:  pleasant and cooperative, normal mood and affect   PRIOR EXAMS: Neurological:     Mental Status: He is alert and oriented to person, place, and time.     Comments: Pt is alert, oriented to person, place, month/year. Needed some help with day. Language is non-fluent but with extra time he is able to communicate needs with words and short sentences. Able to ID several objects in room with minimal pause. CN exam was non-focal. Mild right hemiparesis RUE: 4+/5, RLE 2+ prox to-4/5 (confounded by RTKA). LUE and LLE  4 to 5/5. No focal sensory loss. Cog wheeling rigidity with tremor noted in both RUE and RLE. Cog wheeling and tremor less on left side. Normal resting tone. DTR's trace to 1+    Assessment/Plan: 1. Functional deficits which require 3+ hours per day of interdisciplinary therapy in a comprehensive inpatient rehab setting. Physiatrist is providing close team supervision and 24 hour management of active medical problems listed below. Physiatrist and rehab team continue to assess barriers to discharge/monitor patient progress toward functional and medical goals  Care Tool:  Bathing    Body  parts bathed by patient: Right arm, Chest, Abdomen, Front perineal area, Right upper leg, Left upper leg, Face, Left arm, Right lower leg, Left lower leg   Body parts bathed by helper: Buttocks, Right lower leg, Left lower leg     Bathing assist Assist Level: Minimal Assistance - Patient > 75%     Upper Body Dressing/Undressing Upper body dressing   What is the patient wearing?: Pull over shirt    Upper body assist Assist Level: Minimal Assistance - Patient > 75%    Lower Body Dressing/Undressing Lower body dressing      What is the patient wearing?: Incontinence brief, Pants     Lower body assist Assist for lower body dressing: Moderate Assistance - Patient 50 - 74%     Toileting Toileting Toileting  Activity did not occur Press Photographer and hygiene only): N/A (no void or bm)  Toileting assist       Transfers Chair/bed transfer  Transfers assist     Chair/bed transfer assist level: Minimal Assistance - Patient > 75%     Locomotion Ambulation   Ambulation assist      Assist level: Minimal Assistance - Patient > 75% Assistive device: Walker-rolling Max distance: 200 ft   Walk 10 feet activity   Assist     Assist level: Contact Guard/Touching assist Assistive device: Walker-rolling   Walk 50 feet activity   Assist    Assist level: Contact Guard/Touching assist Assistive device: Walker-rolling    Walk 150 feet activity   Assist    Assist level: Minimal Assistance - Patient > 75% Assistive device: Walker-rolling    Walk 10 feet on uneven surface  activity   Assist Walk 10 feet on uneven surfaces activity did not occur: Safety/medical concerns         Wheelchair     Assist Is the patient using a wheelchair?: Yes Type of Wheelchair: Manual    Wheelchair assist level: Dependent - Patient 0% Max wheelchair distance: 200 ft    Wheelchair 50 feet with 2 turns activity    Assist        Assist Level: Dependent - Patient 0%   Wheelchair 150 feet activity     Assist      Assist Level: Dependent - Patient 0%   Blood pressure (!) 145/77, pulse 76, temperature 97.6 F (36.4 C), temperature source Oral, resp. rate 18, height 6' 1 (1.854 m), weight 102.2 kg, SpO2 98%.  Medical Problem List and Plan: 1. 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             -patient may shower -ELOS/Goals: 12-14 days, goals supervision with PT, OT and min-sup with SLP   -Continue CIR  2.  Antithrombotics: -DVT/anticoagulation:  Mechanical: Sequential compression devices, below knee Right lower extremity in addition  to below.  -LE dopplers 01/13/23 were clear. -antiplatelet therapy: Aspirin  and Brilinta  for 30 days followed by aspirin  alone. Last dose of Brilinta : 02/11/2023.   3. Pain Management: Tylenol , Robaxin , oxycodone  as needed   4. Mood/Behavior/Sleep: LCSW to evaluate and provide emotional support             -continue Ambien  10 mg q HS             -continue Lexapro  20 daily             -antipsychotic agents: n/a   5. Neuropsych/cognition: This patient is  capable of making decisions on his own behalf.   6. Skin/Wound Care: Routine skin care checks             -maintain Aquacel surgical dressing   7. Fluids/Electrolytes/Nutrition: Routine Is and Os and follow-up chemistries   8: Hypertension: monitor TID and prn (see BPH meds below); also intermittent Hypotension, see #19             -continue Toprol  12.5 mg q HS  -1/4-5/25 BPs great, cont regimen Vitals:   01/15/23 1401 01/15/23 1954 01/16/23 0301 01/16/23 1515  BP: 126/72 115/75 124/80 126/81   01/16/23 1933 01/17/23 0320  BP: 120/81 (!) 145/77     9: Hyperlipidemia: continue rosuvastatin  20mg  daily   10: BPH: continue Flomax  0.4mg  daily and Proscar  5mg  daily   11: Parkinson disease, possibly ET/PD:  -Sinemet  25/100, 2 tablets at 7 AM/2 tablets at 10 AM/2 at 1pm/2 at 4pm/2 at 7pm and 50/200 at bedtime -continue ropinirole  1 mg TID for rigidity -follows with Dr. Evonnie   12: Allergic rhinitis: continue Zaditor  gtts, Astelin  spray, Flonase , Claritin  10mg  daily   13: Asthma: continue Singulair  10mg  daily   14: History of CP, NSTEMI, CAD, RBBB and high burden of PVCs s/p Link monitor in past -continue mexiletine 150 mg BID -Plavix  discontinued and now on asa and Brilinta  -continue Crestor  20 mg daily -follows with Dr. Jeffrie   15: Mexiletine GERD/Barrett's esophagus: Protonix    16: Primary OA of right knee s/p TKA 01/11/2023 (previous left TKA)             -maintain zero degree knee bone foam 10-15 minutes TID              -range of motion in bed/with therapy   17: Mild ABLA: follow-up CBC  -01/16/23 Hgb 12.3, monitor routinely   18: Anti-constipation: continue colace 100 mg BID; prns ordered  -01/17/23 LBM yesterday, cont regimen   19: History of chronic intermittent hypotension:             -continue midodrine  5 mg BID for systolic BP < 100    LOS: 2 days A FACE TO FACE EVALUATION WAS PERFORMED  [id number] [place] 01/17/2023, 10:42 AM

## 2023-01-18 DIAGNOSIS — Z96651 Presence of right artificial knee joint: Secondary | ICD-10-CM | POA: Diagnosis not present

## 2023-01-18 DIAGNOSIS — I951 Orthostatic hypotension: Secondary | ICD-10-CM

## 2023-01-18 DIAGNOSIS — G20B1 Parkinson's disease with dyskinesia, without mention of fluctuations: Secondary | ICD-10-CM | POA: Diagnosis not present

## 2023-01-18 DIAGNOSIS — N400 Enlarged prostate without lower urinary tract symptoms: Secondary | ICD-10-CM

## 2023-01-18 DIAGNOSIS — I63412 Cerebral infarction due to embolism of left middle cerebral artery: Secondary | ICD-10-CM | POA: Diagnosis not present

## 2023-01-18 LAB — BASIC METABOLIC PANEL
Anion gap: 9 (ref 5–15)
BUN: 20 mg/dL (ref 8–23)
CO2: 23 mmol/L (ref 22–32)
Calcium: 8.9 mg/dL (ref 8.9–10.3)
Chloride: 103 mmol/L (ref 98–111)
Creatinine, Ser: 0.97 mg/dL (ref 0.61–1.24)
GFR, Estimated: >60 mL/min (ref >60)
Glucose, Bld: 102 mg/dL — ABNORMAL HIGH (ref 70–99)
Potassium: 4.6 mmol/L (ref 3.5–5.1)
Sodium: 135 mmol/L (ref 135–145)

## 2023-01-18 LAB — CBC
HCT: 36.8 % — ABNORMAL LOW (ref 39.0–52.0)
Hemoglobin: 12.1 g/dL — ABNORMAL LOW (ref 13.0–17.0)
MCH: 30.5 pg (ref 26.0–34.0)
MCHC: 32.9 g/dL (ref 30.0–36.0)
MCV: 92.7 fL (ref 80.0–100.0)
Platelets: 275 10*3/uL (ref 150–400)
RBC: 3.97 MIL/uL — ABNORMAL LOW (ref 4.22–5.81)
RDW: 14.1 % (ref 11.5–15.5)
WBC: 7.9 10*3/uL (ref 4.0–10.5)
nRBC: 0 % (ref 0.0–0.2)

## 2023-01-18 NOTE — Progress Notes (Signed)
 Speech Language Pathology Daily Session Note  Patient Details  Name: Nils Thor MRN: [id number] Date of Birth: 09/14/1945  Today's Date: 01/18/2023 SLP Individual Time: 1100-1201 SLP Individual Time Calculation (min): 61 min  Short Term Goals: Week 1: SLP Short Term Goal 1 (Week 1): Patient will utilize compensatory strategies to increase word finding of abstract thoughts during conversation given mod multimodal A SLP Short Term Goal 2 (Week 1): Patient will demonstrate problem solving abilities during basic-mildly complex functional daily situations given mod multimodal A SLP Short Term Goal 3 (Week 1): Patient will recall and utilize memory compensatory strategies given mod multimodal A SLP Short Term Goal 4 (Week 1): Patient will demonstrate attention to R during functional tasks given mod multimodal A SLP Short Term Goal 5 (Week 1): Patient will demonstrate awareness of errors during functional tasks given mod multimodal A  Skilled Therapeutic Interventions:  Patient was seen in am to address cognitive re- training and expressive language. Pt was alert and seen at bedside upon SLP arrival. He was eager to participate in speech therapy c/b stating, speech therapy? Oh, I need you. SLP initiated session through challenging pt to verbalize recent medical hx and PLOF. Pt presents with multiple instances of anomia and false starts with inability and/ or difficulty recovering. SLP instructed pt in circumlocution strategies and subsequently challenged pt in structured task with pt challenged to convey a word with no prior context to SLP. Pt required min A throughout task to improve accuracy of strategy. Pt subsequently challenged in completion of a semantic feature analysis chart (SFA) in order to activate semantic networks in order to assist with word retrieval. Pt completed chart requiring min A from SLP. In final minutes of session, SLP addressed problem solving and attention to R side  through card matching task. Pt matched related pictures and attended to right side with overall sup A. Direct handoff to nurse at conclusion of session for medication management. SLP to continue POC.    Pain Pain Assessment Pain Scale: 0-10 Pain Score: 7  Pain Type: Surgical pain Pain Location: Knee Pain Orientation: Right Pain Descriptors / Indicators: Aching  Therapy/Group: Individual Therapy  [name] 01/18/2023, 12:56 PM

## 2023-01-18 NOTE — Progress Notes (Signed)
 Patient ID: Joshua [name], male   DOB: [birth year], 78 y.o.   MRN: [id number]  1012-SW spoke with pt wife [name] to introduce self, explain  role, discuss discharge process, and inform on ELOS. She confirms she will be his primary caregiver. If HH therapies recommended, prefers [facility] as pt had in the past after a knee surgery. She is aware SW will follow-up with updates after team conference.   Graeme Jude, MSW, LCSW Office: [phone] Cell: [phone] Fax: [phone]

## 2023-01-18 NOTE — IPOC Note (Signed)
 Overall Plan of Care [facility]) Patient Details Name: Joshua [name] MRN: [id number] DOB: [birth year]  Admitting Diagnosis: CVA (cerebral vascular accident) [facility])  Hospital Problems: Principal Problem:   CVA (cerebral vascular accident) [facility])     Functional Problem List: Nursing Safety, Pain, Endurance, Medication Management, Bowel  PT Balance, Edema, Endurance, Motor, Pain, Perception, Safety, Sensory, Skin Integrity  OT Balance, Cognition, Edema, Endurance, Motor, Pain, Perception, Safety, Sensory, Skin Integrity, Vision  SLP Cognition, Linguistic  TR         Basic ADL's: OT Eating, Grooming, Bathing, Dressing, Toileting     Advanced  ADL's: OT       Transfers: PT Bed to Chair, Car, Bed Mobility, Occupational Psychologist, Research Scientist (life Sciences): PT Ambulation, Stairs     Additional Impairments: OT Fuctional Use of Upper Extremity  SLP Communication, Social Cognition expression Memory, Attention, Awareness, Problem Solving  TR      Anticipated Outcomes Item Anticipated Outcome  Self Feeding Mod I  Swallowing      Basic self-care  Supervision  Toileting  Supervision   Bathroom Transfers Supervision  Bowel/Bladder  manage bowel and bladder w mod I assist  Transfers  Mod I  Locomotion  supervision  Communication  minA  Cognition  minA  Pain  < 4 with prns  Safety/Judgment  manage w cues   Therapy Plan: PT Intensity: Minimum of 1-2 x/day ,45 to 90 minutes PT Frequency: 5 out of 7 days PT Duration Estimated Length of Stay: 9-11 days OT Intensity: Minimum of 1-2 x/day, 45 to 90 minutes OT Frequency: 5 out of 7 days OT Duration/Estimated Length of Stay: 10-12 days SLP Intensity: Minumum of 1-2 x/day, 30 to 90 minutes SLP Frequency: 3 to 5 out of 7 days SLP Duration/Estimated Length of Stay: 10-12 days   Team Interventions: Nursing Interventions Bladder Management, Bowel Management, Medication Management, Pain Management, Discharge Planning,  Disease Management/Prevention, Patient/Family Education  PT interventions Community reintegration, Ambulation/gait training, Warden/ranger, Cognitive remediation/compensation, Disease management/prevention, Discharge planning, DME/adaptive equipment instruction, Neuromuscular re-education, Psychosocial support, Stair training, UE/LE Strength taining/ROM, Pain management, Skin care/wound management, Therapeutic Activities, UE/LE Coordination activities, Visual/perceptual remediation/compensation, Therapeutic Exercise, Splinting/orthotics, Functional mobility training, Patient/family education  OT Interventions Warden/ranger, Community reintegration, Cognitive remediation/compensation, Discharge planning, Disease mangement/prevention, DME/adaptive equipment instruction, Functional electrical stimulation, Functional mobility training, Neuromuscular re-education, Pain management, Patient/family education, Psychosocial support, Self Care/advanced ADL retraining, Skin care/wound managment, Splinting/orthotics, Therapeutic Activities, Therapeutic Exercise, UE/LE Strength taining/ROM, UE/LE Coordination activities, Visual/perceptual remediation/compensation, Wheelchair propulsion/positioning  SLP Interventions Cognitive remediation/compensation, Internal/external aids, Speech/Language facilitation, Cueing hierarchy, Therapeutic Activities, DME/adaptive equipment instruction, Functional tasks, Patient/family education, Therapeutic Exercise, Multimodal communication approach  TR Interventions    SW/CM Interventions Discharge Planning, Psychosocial Support, Patient/Family Education   Barriers to Discharge MD  Medical stability and Wound care  Nursing Decreased caregiver support level/level entry w spouse; dtr to assist  PT Decreased caregiver support, Wound Care, Lack of/limited family support, Insurance for SNF coverage, Weight bearing restrictions    OT Incontinence, Wound Care,  Weight bearing restrictions    SLP      SW Decreased caregiver support, Lack of/limited family support, Community Education Officer for SNF coverage     Team Discharge Planning: Destination: PT-Home ,OT- Home , SLP-Home Projected Follow-up: PT-Outpatient PT, 24 hour supervision/assistance, OT-  Outpatient OT, SLP-Home Health SLP, Outpatient SLP Projected Equipment Needs: PT-To be determined, OT- To be determined, SLP-None recommended by SLP Equipment Details: PT- , OT-  Patient/family involved in discharge planning: PT-  Patient,  OT-Patient, SLP-Patient  MD ELOS: 10-14d Medical Rehab Prognosis:  Good Assessment: The patient has been admitted for CIR therapies with the diagnosis of CVA. The team will be addressing functional mobility, strength, stamina, balance, safety, adaptive techniques and equipment, self-care, bowel and bladder mgt, patient and caregiver education, Parkinson's disease , orthostatic hypotension, recent R TKR. Goals have been set at [employer] A/Sup. Anticipated discharge destination is Home .        See Team Conference Notes for weekly updates to the plan of care

## 2023-01-18 NOTE — Progress Notes (Signed)
 Inpatient Rehabilitation  Patient information reviewed and entered into eRehab system by [facility]. Karen Kays., CCC/SLP, PPS Coordinator.  Information including medical coding, functional ability and quality indicators will be reviewed and updated through discharge.

## 2023-01-18 NOTE — Care Management (Signed)
 Inpatient Rehabilitation Center Individual Statement of Services  Patient Name:  Joshua [name]  Date:  01/18/2023  Welcome to the Inpatient Rehabilitation Center.  Our goal is to provide you with an individualized program based on your diagnosis and situation, designed to meet your specific needs.  With this comprehensive rehabilitation program, you will be expected to participate in at least 3 hours of rehabilitation therapies Monday-Friday, with modified therapy programming on the weekends.  Your rehabilitation program will include the following services:  Physical Therapy (PT), Occupational Therapy (OT), Speech Therapy (ST), 24 hour per day rehabilitation nursing, Therapeutic Recreaction (TR), Psychology, Neuropsychology, Care Coordinator, Rehabilitation Medicine, Nutrition Services, Pharmacy Services, and Other  Weekly team conferences will be held on Wednesdays to discuss your progress.  Your Inpatient Rehabilitation Care Coordinator will talk with you frequently to get your input and to update you on team discussions.  Team conferences with you and your family in attendance may also be held.  Expected length of stay: 9-12 days    Overall anticipated outcome: Supervision  Depending on your progress and recovery, your program may change. Your Inpatient Rehabilitation Care Coordinator will coordinate services and will keep you informed of any changes. Your Inpatient Rehabilitation Care Coordinator's name and contact numbers are listed  below.  The following services may also be recommended but are not provided by the Inpatient Rehabilitation Center:  Driving Evaluations Home Health Rehabiltiation Services Outpatient Rehabilitation Services Vocational Rehabilitation   Arrangements will be made to provide these services after discharge if needed.  Arrangements include referral to agencies that provide these services.  Your insurance has been verified to be:  [employer]  Your  primary doctor is:  Elsie Gentry  Pertinent information will be shared with your doctor and your insurance company.  Inpatient Rehabilitation Care Coordinator:  Graeme Feliciana SILK [phone] or [phone]  Information discussed with and copy given to patient by: Graeme DELENA Feliciana, 01/18/2023, 10:10 AM

## 2023-01-18 NOTE — Progress Notes (Signed)
 Physical Therapy Session Note  Patient Details  Name: Joshua [name] MRN: [id number] Date of Birth: 1945/12/29  Today's Date: 01/18/2023 PT Individual Time: 1303-1416 PT Individual Time Calculation (min): 73 min   Short Term Goals: Week 1:  PT Short Term Goal 1 (Week 1): STG = LTG d/t ELOS  Skilled Therapeutic Interventions/Progress Updates:  Patient supine in bed on entrance to room. Patient alert and agreeable to PT session.   Patient with low complaint at start of session. No numerical score provided. Difficulty with expressing news from family that wife is feeling better and may be able to visit pt today or tmrw.   Therapeutic Activity: Bed Mobility: Pt performed supine <> sit with supervision/ ModI. No vc required for technique.  Transfers: Pt performed sit<>stand and stand pivot transfers throughout session with need for slightly elevated position and use of BUE on seated surface for push and not on RW. Provided vc/ tc for improved technique and performance.  Gait Training:  Pt ambulated >250 ft to day room using RW with close supervision. Demonstrated flexed posture and good, consistent pace with good floor clearance bilaterally. Provided vc/ tc for upright posture and level gaze.  Pt guided in curb step training using RW. After verbal instructions and visual demonstration pt is able to return demo use of RW to navigate 5 step leading with LLE to ascend and with RLE to descend. Requires vc for R hand grip on RW.   Therapeutic Exercise/NMR: Pt guided in continuous reciprocation of BUE and BLE using NuStep L5 x and then final L5 x with continuous reciprocation with BLE only. Focus on maintaining L knee stability with alignment of knee over ankle and 2nd toe. Improvement with focus. Maintains good pace throughout with METs 2.0 - 2.3 throughout and reaches 867 steps.    Neuromuscular Re-ed: NMR facilitated during session with focus on standing balance. Pt guided in  toss of small theraball to rebounder for 15 throws in first minute.  Provided with smaller rubber ball and is able to maintain balance with throw of ball for two additional one-minute bouts. Only 2 mis-throws Is able to avg 20-30 throws in eachminute. NMR performed for improvements in motor control and coordination, balance, sequencing, judgement, and self confidence/ efficacy in performing all aspects of mobility at highest level of independence.   Patient seated upright in recliner at end of session with brakes locked, belt alarm set, and all needs within reach.   Therapy Documentation Precautions:  Precautions Precautions: Knee, Fall Precaution Comments: R TKA (need to rest in extension), R hemi/inattention, Expressive aphasia, CPM to R knee at least once daily Restrictions Weight Bearing Restrictions Per Provider Order: Yes RLE Weight Bearing Per Provider Order: Weight bearing as tolerated  Pain:  No pain complaint during session but when asked at end of session, pt relates 7/10 pain. Nursing notified.   Therapy/Group: Individual Therapy  Mliss DELENA Milliner PT, DPT, CSRS 01/18/2023, 5:46 PM

## 2023-01-18 NOTE — Progress Notes (Addendum)
 PROGRESS NOTE   Subjective/Complaints:  Right knee pain control fair, does well in CPM   ROS: as per HPI. Denies CP, SOB, abd pain, N/V/D/C, or any other complaints at this time.    Objective:   No results found. Recent Labs    01/16/23 0612 01/18/23 0527  WBC 8.2 7.9  HGB 12.3* 12.1*  HCT 37.6* 36.8*  PLT 252 275   Recent Labs    01/16/23 0612 01/18/23 0527  NA 136 135  K 4.2 4.6  CL 104 103  CO2 22 23  GLUCOSE 107* 102*  BUN 19 20  CREATININE 0.85 0.97  CALCIUM  9.0 8.9      Intake/Output Summary (Last 24 hours) at 01/18/2023 9090 Last data filed at 01/18/2023 0744 Gross per 24 hour  Intake 818 ml  Output 225 ml  Net 593 ml     Pressure Injury 01/15/23 Thigh Anterior;Right Stage 1 -  Intact skin with non-blanchable redness of a localized area usually over a bony prominence. long reddened area likely from contact with ice pack or ted hose (Active)  01/15/23 1357  Location: Thigh  Location Orientation: Anterior;Right  Staging: Stage 1 -  Intact skin with non-blanchable redness of a localized area usually over a bony prominence.  Wound Description (Comments): long reddened area likely from contact with ice pack or ted hose  Present on Admission: Yes    Physical Exam: Vital Signs Blood pressure (!) 140/69, pulse 66, temperature 97.9 F (36.6 C), resp. rate 18, height 6' 1 (1.854 m), weight 102.2 kg, SpO2 98%.   General: No acute distress Mood and affect are appropriate Heart: Regular rate and rhythm no rubs murmurs or extra sounds Lungs: Clear to auscultation, breathing unlabored, no rales or wheezes Abdomen: Positive bowel sounds, soft nontender to palpation, nondistended Extremities: No clubbing, cyanosis, or edema Skin: No evidence of breakdown, no evidence of rash  PRIOR EXAMS: Neurological:     Mental Status: He is alert and oriented to person, place, and time.     Comments: Pt is alert,  oriented to person, place, month/year. Needed some help with day. Language is non-fluent but with extra time he is able to communicate needs with words and short sentences. Mild right hemiparesis RUE: 4+/5, RLE 2+ prox to-4/5 (confounded by RTKA). LUE and LLE  5/5. No focal sensory loss. Cog wheeling rigidity with tremor noted in BUE . [name] Normal resting tone. DTR's trace to 1+    Assessment/Plan: 1. Functional deficits which require 3+ hours per day of interdisciplinary therapy in a comprehensive inpatient rehab setting. Physiatrist is providing close team supervision and 24 hour management of active medical problems listed below. Physiatrist and rehab team continue to assess barriers to discharge/monitor patient progress toward functional and medical goals  Care Tool:  Bathing    Body parts bathed by patient: Right arm, Chest, Abdomen, Front perineal area, Right upper leg, Left upper leg, Face, Left arm, Right lower leg, Left lower leg   Body parts bathed by helper: Buttocks, Right lower leg, Left lower leg     Bathing assist Assist Level: Minimal Assistance - Patient > 75%     Upper Body Dressing/Undressing Upper  body dressing   What is the patient wearing?: Pull over shirt    Upper body assist Assist Level: Minimal Assistance - Patient > 75%    Lower Body Dressing/Undressing Lower body dressing      What is the patient wearing?: Incontinence brief, Pants     Lower body assist Assist for lower body dressing: Moderate Assistance - Patient 50 - 74%     Toileting Toileting Toileting Activity did not occur Press Photographer and hygiene only): N/A (no void or bm)  Toileting assist Assist for toileting: Minimal Assistance - Patient > 75%     Transfers Chair/bed transfer  Transfers assist     Chair/bed transfer assist level: Minimal Assistance - Patient > 75%     Locomotion Ambulation   Ambulation assist      Assist level: Minimal Assistance - Patient >  75% Assistive device: Walker-rolling Max distance: 200 ft   Walk 10 feet activity   Assist     Assist level: Contact Guard/Touching assist Assistive device: Walker-rolling   Walk 50 feet activity   Assist    Assist level: Contact Guard/Touching assist Assistive device: Walker-rolling    Walk 150 feet activity   Assist    Assist level: Minimal Assistance - Patient > 75% Assistive device: Walker-rolling    Walk 10 feet on uneven surface  activity   Assist Walk 10 feet on uneven surfaces activity did not occur: Safety/medical concerns         Wheelchair     Assist Is the patient using a wheelchair?: Yes Type of Wheelchair: Manual    Wheelchair assist level: Dependent - Patient 0% Max wheelchair distance: 200 ft    Wheelchair 50 feet with 2 turns activity    Assist        Assist Level: Dependent - Patient 0%   Wheelchair 150 feet activity     Assist      Assist Level: Dependent - Patient 0%   Blood pressure (!) 140/69, pulse 66, temperature 97.9 F (36.6 C), resp. rate 18, height 6' 1 (1.854 m), weight 102.2 kg, SpO2 98%.  Medical Problem List and Plan: 1. 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             -patient may shower -ELOS/Goals: 12-14 days, goals supervision with PT, OT and min-sup with SLP   -Continue CIR  2.  Antithrombotics: -DVT/anticoagulation:  Mechanical: Sequential compression devices, below knee Right lower extremity in addition to below.  -LE dopplers 01/13/23 were clear. -antiplatelet therapy: Aspirin  and Brilinta  for 30 days followed by aspirin  alone. Last dose of Brilinta : 02/11/2023.   3. Pain Management: Tylenol , Robaxin , oxycodone  as needed   4. Mood/Behavior/Sleep: LCSW to evaluate and provide emotional support             -continue Ambien  10 mg q HS             -continue  Lexapro  20 daily             -antipsychotic agents: n/a   5. Neuropsych/cognition: This patient is capable of making decisions on his own behalf.   6. Skin/Wound Care: Routine skin care checks             -maintain Aquacel surgical dressing   7. Fluids/Electrolytes/Nutrition: Routine Is and Os and follow-up chemistries    Vitals:   01/15/23 1401 01/15/23 1954 01/16/23 0301 01/16/23 1515  BP: 126/72 115/75 124/80 126/81   01/16/23 1933 01/17/23 0320 01/17/23 1359 01/17/23 2018  BP: 120/81 (!) 145/77 107/69 132/70   01/18/23 0435  BP: (!) 140/69     9: Hyperlipidemia: continue rosuvastatin  20mg  daily   10: BPH: continue Flomax  0.4mg  daily and Proscar  5mg  daily   11: Parkinson disease, possibly ET/PD:  -Sinemet  25/100, 2 tablets at 7 AM/2 tablets at 10 AM/2 at 1pm/2 at 4pm/2 at 7pm and 50/200 at bedtime -continue ropinirole  1 mg TID for rigidity -follows with Dr. Evonnie   12: Allergic rhinitis: continue Zaditor  gtts, Astelin  spray, Flonase , Claritin  10mg  daily   13: Asthma: continue Singulair  10mg  daily   14: History of CP, NSTEMI, CAD, RBBB and high burden of PVCs s/p Link monitor in past -continue mexiletine 150 mg BID -Plavix  discontinued and now on asa and Brilinta  -continue Crestor  20 mg daily -follows with Dr. Jeffrie   15: Mexiletine GERD/Barrett's esophagus: Protonix    16: Primary OA of right knee s/p TKA 01/11/2023 (previous left TKA)             -maintain zero degree knee bone foam 10-15 minutes TID             -range of motion in bed/with therapy  CPM Comp hose  17: Mild ABLA: follow-up CBC      Latest Ref Rng & Units 01/18/2023    5:27 AM 01/16/2023    6:12 AM 01/14/2023    7:04 AM  CBC  WBC 4.0 - 10.5 K/uL 7.9  8.2  8.3   Hemoglobin 13.0 - 17.0 g/dL 87.8  87.6  87.9   Hematocrit 39.0 - 52.0 % 36.8  37.6  36.2   Platelets 150 - 400 K/uL 275  252  197       18: Anti-constipation: continue colace 100 mg BID; prns ordered  -01/17/23 LBM yesterday, cont regimen    19: History of chronic intermittent hypotension:             -continue midodrine  5 mg BID for systolic BP < 100  Flomax  may be contributing will check PVR see if we can hold   LOS: 3 days A FACE TO FACE EVALUATION WAS PERFORMED  Prentice FORBES Compton 01/18/2023, 9:09 AM

## 2023-01-18 NOTE — Progress Notes (Signed)
 Occupational Therapy Session Note  Patient Details  Name: Joshua [name] MRN: [id number] Date of Birth: 08-28-1945  Today's Date: 01/18/2023 OT Individual Time: 0800-0859 OT Individual Time Calculation (min): 59 min    Short Term Goals: Week 1:  OT Short Term Goal 1 (Week 1): STGs=LTGs due to patient's estimated length of stay.  Skilled Therapeutic Interventions/Progress Updates:     Pt received sitting EOB finishing up his breakfast upon OT arrival. Pt presenting to be in good spirits receptive to skilled OT session reporting 7/10 pain- OT offering intermittent rest breaks, repositioning, and therapeutic support to optimize participation in therapy session. Pt requesting pain medications at beginning of session with RN informed and in/out during session. Focus this session BADL retraining, functional mobility training, dynamic balance, and R UE functional use. Pt wearing clean shorts and underwear upon OT arrival- requesting to change shirt and complete grooming/hygiene tasks. Pt transitioned to EOB using bed features with supervision. Pt donned thigh high TEDs and socks with mod A- Pt attempting to cross B LEs into figure-four position, however limited d/t recent TKA. Assistance required to fully position socks over B LES and to bring socks over thighs d/t R strength deficit and baseline tremors. Pt donned slip on shoes with set-up A. Pt doff/donned clean shirt with set-up assist +increased time. Engaged Pt in completing grooming/hygiene tasks standing at sink for increased balance and endurance challenge. Pt able to manipulate cap of toothpaste and razor cover with supervision during session with min cues provided for positioning and problem solving, however significantly increased amount of time and maximal effort required to complete tasks. Pt able to brush teeth, groom hair, shave, and wash face with CGA using RW for balance +increased time. Pt completed functional mobility to therapy gym  using RW with CGA and mod verbal cues for trunk/head positioning to support improved postural alignment and balance. Pt noted to be heavily relying on B UE supported on RW and RW noted to be adjusted to be too short for Pt. Upon arriving to therapy gym, adjust RW to increased hight to support improved alignment and positioning during ambulation. Engaged Pt in dynamic standing balance activity with maintained grasp, cross midline and posterior reaching, and maintained dynamic standing incorporated into activity to simulate skills required for BADLs. Pt instructed to maintain balance using RW while reaching across midline to retrieve squigz from table top positioned on Pt's L and then place squigz in basket positioned posteriorly and to the R of the pt. Pt able to complete 2x10 reps with CGA provided for balance with min dropping noted- maximal effort required to maintain grasp on squigz. Seated rest break provided following. Pt completed functional mobility back to his room using RW with CGA provided for balance and min-mod verbal cues required for head positioning this trial. Pt was left resting in bed with call bell in reach, bed alarm on, and all needs met.    Therapy Documentation Precautions:  Precautions Precautions: Knee, Fall Precaution Comments: R TKA (need to rest in extension), R hemi/inattention, Expressive aphasia, CPM to R knee at least once daily Restrictions Weight Bearing Restrictions Per Provider Order: Yes RLE Weight Bearing Per Provider Order: Weight bearing as tolerated   Therapy/Group: Individual Therapy  Joshua [name] 01/18/2023, 7:58 AM

## 2023-01-18 NOTE — Progress Notes (Signed)
 Patient ID: Joshua [name], male   DOB: 1945/10/13, 78 y.o.   MRN: [id number] Met with the patient to review current medical condition, rehab schedule, team conference and plan of care. Reviewed CVA post TKA with CPM at bedside. Discussed HH diet and medications including CAD/CVA , HTN, and Parkinson's. Patient noted he has a brace at home he wears for tremors; wife to bring in.  Continue to follow along to address educational needs to facilitate preparation for discharge. Fredericka Barnie NOVAK

## 2023-01-18 NOTE — Progress Notes (Signed)
 Inpatient Rehabilitation Care Coordinator Assessment and Plan Patient Details  Name: Joshua [name] MRN: [id number] Date of Birth: 1945/02/01  Today's Date: 01/18/2023  Hospital Problems: Principal Problem:   CVA (cerebral vascular accident) [facility])  Past Medical History:  Past Medical History:  Diagnosis Date   Allergic rhinitis    Anxiety    from chronic pain from surgery- on Cymbalta    Arthritis    Asthma    Barrett's esophagus 03/29/2014   Carotid artery disease (HCC)    Carotid Doppler normal August, 2007   Coronary artery disease    Diverticulosis    Dyslipidemia    GERD (gastroesophageal reflux disease)    History of loop recorder    has since 04/04/15   HTN (hypertension)    takes Metoprolol  for PVC control   Hx of colonic polyps    adenomatous   IFG (impaired fasting glucose)    Myocardial infarction (HCC)    mild   Neuromuscular disorder (HCC)    parkinson's tremors in hands   Palpitations    Benign PVCs   Parkinson disease (HCC)    Pneumonia    Prostate cancer (HCC)    RBBB (right bundle branch block)    rate related   Shingles    Stroke (HCC) 2017   TIA (transient ischemic attack) 02/2015   Per pt, had 2 strokes   Vertigo    Past Surgical History:  Past Surgical History:  Procedure Laterality Date   BACK SURGERY  2002,2009   x 6   CHOLECYSTECTOMY  11/30/2012   with IOC   COLONOSCOPY     ELECTROPHYSIOLOGIC STUDY N/A 09/26/2015   Procedure: V Tach Ablation (PVC);  Surgeon: Will Gladis Norton, MD;  Location: [facility]IVE [facility];  Service: Cardiovascular;  Laterality: N/A;   EP IMPLANTABLE DEVICE N/A 04/04/2015   Procedure: Loop Recorder Insertion;  Surgeon: Will Gladis Norton, MD;  Location: [facility];  Service: Cardiovascular;  Laterality: N/A;   HERNIA REPAIR     laprascopic   KNEE SURGERY     DECEMBER 2017,LEFT KNEE SCOPED   LEFT HEART CATH AND CORONARY ANGIOGRAPHY N/A 02/05/2021   Procedure: LEFT HEART CATH AND CORONARY  ANGIOGRAPHY;  Surgeon: Darron Deatrice LABOR, MD;  Location: [facility];  Service: Cardiovascular;  Laterality: N/A;   NECK SURGERY  2002   POLYPECTOMY     ROTATOR CUFF REPAIR Left    TEE WITHOUT CARDIOVERSION N/A 04/04/2015   Procedure: TRANSESOPHAGEAL ECHOCARDIOGRAM (TEE);  Surgeon: Redell GORMAN Shallow, MD;  Location: [facility] ENDOSCOPY;  Service: Cardiovascular;  Laterality: N/A;   TOTAL KNEE ARTHROPLASTY Left 09/28/2022   Procedure: TOTAL KNEE ARTHROPLASTY;  Surgeon: Edna Toribio LABOR, MD;  Location: [facility] OR[facility];  Service: Orthopedics;  Laterality: Left;   TOTAL KNEE ARTHROPLASTY Right 01/11/2023   Procedure: TOTAL KNEE ARTHROPLASTY;  Surgeon: Edna Toribio LABOR, MD;  Location: [facility] OR[facility];  Service: Orthopedics;  Laterality: Right;   TRIGGER FINGER RELEASE Left 12/28/2019   Procedure: RELEASE TRIGGER FINGER/A-1 PULLEY THUMB, MIDDLE AND RING;  Surgeon: Murrell Kuba, MD;  Location: [facility];  Service: Orthopedics;  Laterality: Left;  FAB   UPPER GASTROINTESTINAL ENDOSCOPY     V Tach ablation  09/26/2015   Social History:  reports that he has never smoked. He has never used smokeless tobacco. He reports that he does not drink alcohol  and does not use drugs.  Family / Support Systems Marital Status: Married How Long?: 54 years Patient Roles: Spouse Spouse/Significant Other: Mining Engineer (  wife) Children: 2 children- Comer lives in [place] and [place]- lives in [place] Other Supports: PRN support from children Anticipated Caregiver: wife Ability/Limitations of Caregiver: Pt will d/c to home with his wife Caregiver Availability: 24/7 Family Dynamics: Pt lives with his wife.  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 Pt is not a cytogeneticist. Education: 6th grade Health Literacy - How often do you need to have someone help you when you read instructions, pamphlets, or other written material from  your doctor or pharmacy?: Never Writes: Yes Employment Status: Retired Date Retired/Disabled/Unemployed: Retired Marine Scientist Issues: Denies Guardian/Conservator: N/A   Abuse/Neglect Abuse/Neglect Assessment Can Be Completed: Yes Physical Abuse: Denies Verbal Abuse: Denies Sexual Abuse: Denies Exploitation of patient/patient's resources: Denies Self-Neglect: Denies  Patient response to: Social Isolation - How often do you feel lonely or isolated from those around you?: Never  Emotional Status Pt's affect, behavior and adjustment status: Pt in good spirits at time of visit Recent Psychosocial Issues: Denies Psychiatric History: Denies Substance Abuse History: Denies  Patient / Family Perceptions, Expectations & Goals Pt/Family understanding of illness & functional limitations: Pt and wife have a general understanding of care needs Premorbid pt/family roles/activities: Independent Anticipated changes in roles/activities/participation: Assistance with ADLs/IADLs Pt/family expectations/goals: Pt gola is to work on R hand strnegth/grip, getting over knee surgery, and being able to function well at home  [employer]: None Premorbid Home Care/DME Agencies: None Transportation available at discharge: Wife Is the patient able to respond to transportation needs?: Yes In the past 12 months, has lack of transportation kept you from medical appointments or from getting medications?: No In the past 12 months, has lack of transportation kept you from meetings, work, or from getting things needed for daily living?: No Resource referrals recommended: Neuropsychology  Discharge Planning Living Arrangements: Spouse/significant other Support Systems: Spouse/significant other, Children, Other relatives Type of Residence: Private residence Insurance Resources: Media Planner (specify) Administrator Medicare) Financial Resources: Restaurant Manager, Fast Food  Screen Referred: No Living Expenses: Own Money Management: Spouse, Patient Does the patient have any problems obtaining your medications?: No Home Management: Pt and wife manage home care needs Patient/Family Preliminary Plans: TBD Care Coordinator Barriers to Discharge: Decreased caregiver support, Lack of/limited family support, Insurance for SNF coverage Care Coordinator Anticipated Follow Up Needs: HH/OP Expected length of stay: 9-12 days  Clinical Impression SW met with pt in room to introduce self, explain role, and discuss discharge process. DME: 2 walking sticks, 2 canes, motorized cart, w/c, and shower stool.   Breyson Kelm A Tymeshia Awan 01/18/2023, 1:40 PM

## 2023-01-19 DIAGNOSIS — Z96651 Presence of right artificial knee joint: Secondary | ICD-10-CM | POA: Diagnosis not present

## 2023-01-19 DIAGNOSIS — I63412 Cerebral infarction due to embolism of left middle cerebral artery: Secondary | ICD-10-CM | POA: Diagnosis not present

## 2023-01-19 DIAGNOSIS — G20B1 Parkinson's disease with dyskinesia, without mention of fluctuations: Secondary | ICD-10-CM | POA: Diagnosis not present

## 2023-01-19 DIAGNOSIS — N400 Enlarged prostate without lower urinary tract symptoms: Secondary | ICD-10-CM | POA: Diagnosis not present

## 2023-01-19 MED ORDER — DOCUSATE SODIUM 100 MG PO CAPS
200.0000 mg | ORAL_CAPSULE | Freq: Two times a day (BID) | ORAL | Status: DC
  Administered 2023-01-19 – 2023-01-27 (×14): 200 mg via ORAL
  Filled 2023-01-19 (×16): qty 2

## 2023-01-19 NOTE — Progress Notes (Signed)
 Speech Language Pathology Daily Session Note  Patient Details  Name: Joshua [name] MRN: [id number] Date of Birth: December 15, 1945  Today's Date: 01/19/2023 SLP Individual Time: 9198-9154 SLP Individual Time Calculation (min): 44 min  Short Term Goals: Week 1: SLP Short Term Goal 1 (Week 1): Patient will utilize compensatory strategies to increase word finding of abstract thoughts during conversation given mod multimodal A SLP Short Term Goal 2 (Week 1): Patient will demonstrate problem solving abilities during basic-mildly complex functional daily situations given mod multimodal A SLP Short Term Goal 3 (Week 1): Patient will recall and utilize memory compensatory strategies given mod multimodal A SLP Short Term Goal 4 (Week 1): Patient will demonstrate attention to R during functional tasks given mod multimodal A SLP Short Term Goal 5 (Week 1): Patient will demonstrate awareness of errors during functional tasks given mod multimodal A  Skilled Therapeutic Interventions:  Patient was seen in am to address cognitive re- training and expressive language. Pt was alert and agreeable for session. SLP reviewed WRAP compensatory memory strategies and functional examples of utilization. Pt reports at home use of external aids to facilitate recall including calendar in his phone, MyChart, and journal to keep track of information. SLP challenged pt in paragraph retention through presentation of moderate level infromation. SLP guided pt in utilization of repetition and association strategies. Given a 15 minute distracted delay, pt recalled infromation with 60% acc given mod to max A. In other minutes of session SLP addressed language. Pt completed responisve naming task with 100% acc and complete the sentence task with 87% acc improving to 90% with min A. With session progressing to more functional tasks, pt unable to recall word finding strategies discussed on previous date. SLP reviewed circumlocution and  substitution strategies and examples of utiliziation. SLP guided pt in conversational exchange with predetermined topic. Pt with difficulty maintaining topic maintenance warranting mod redirection cues. Pt with 3 instances of overt anomia with pt responsive to min A for use of strategies and ability to recover in 2 of 3 opportunities. At conclusion of session pt was left at bedside with call button within reach and bed alarm active. SLP to continue POC.   Pain Pain Assessment Pain Scale: 0-10 Pain Type: Surgical pain Pain Location: Knee Pain Orientation: Right Pain Descriptors / Indicators: Aching;Sore Pain Intervention(s): RN made aware;Distraction  Therapy/Group: Individual Therapy  [name] 01/19/2023, 8:51 AM

## 2023-01-19 NOTE — Progress Notes (Signed)
 Physical Therapy Session Note  Patient Details  Name: Joshua [name] MRN: [id number] Date of Birth: [birth year]  Today's Date: 01/19/2023 PT Individual Time: 8691-8595 PT Individual Time Calculation (min): 56 min   Short Term Goals: Week 1:  PT Short Term Goal 1 (Week 1): STG = LTG d/t ELOS  Skilled Therapeutic Interventions/Progress Updates: Patient sitting in recliner on entrance to room. Patient alert and agreeable to PT session.   Beginning of session focused on building pt rapport and what pt's personal goals of inpatient rehab are with pt stating ability to ambulate with decreased pain in R knee. Patient reported 6/10 pain in R knee (pt reported it is tolerable right now).  Therapeutic Activity: Transfers: Pt performed sit<>stand transfers throughout session with RW and with supervision/CGA for safety.  TUG (avg = 17.43s) from  hi/low mat with use of L UE to push (B LE in neutral stance on 3rd trial vs L slightly in front)  - 20.38s  - 16.48s  - 15.42s   Therapeutic Exercise: Pt performed the following exercises with therapist providing the described cuing and facilitation for improvement.  - R knee flexion with green theraband 2 x 10 with VC to control eccentric and to avoid holding breath. PTA decreased resistance of theraband on 2nd set due to pt report of increased pain in R knee - Pt tossing bean bags with R UE to cornhole board with L HHA (minA/CGA to maintain standing balance). Table with bags place slightly behind pt as to increase R UE/chest musculature with added emphasis on increasing R UE extension past hips for explosive movement towards board. Pt performed 2 rounds with seated rest break. Pt with increase in distance thrown on 2nd round with increased coordination with min cueing of increase R UE extension.  - Pt ambulated roughly 200' from main gym to room at end of session using RW with CGA for safety due to pt's report of R knee pain. Pt performed ambulation at  end of session to increase WB tolerance/physical endurance on R LE.  Patient sitting in recliner at end of session with brakes locked, and all needs within reach.     Therapy Documentation Precautions:  Precautions Precautions: Knee, Fall Precaution Comments: R TKA (need to rest in extension), R hemi/inattention, Expressive aphasia, CPM to R knee at least once daily Restrictions Weight Bearing Restrictions Per Provider Order: Yes RLE Weight Bearing Per Provider Order: Weight bearing as tolerated  Therapy/Group: Individual Therapy  Jazma Pickel PTA 01/19/2023, 3:32 PM

## 2023-01-19 NOTE — Progress Notes (Signed)
 Physical Therapy Session Note  Patient Details  Name: Joshua [name] MRN: [id number] Date of Birth: September 19, 1945  Today's Date: 01/19/2023 PT Individual Time: 8882-8858 PT Individual Time Calculation (min): 24 min  and Today's Date: 01/19/2023 PT Missed Time: 36 Minutes Missed Time Reason: Pain;Patient unwilling to participate  Short Term Goals: Week 1:  PT Short Term Goal 1 (Week 1): STG = LTG d/t ELOS  Skilled Therapeutic Interventions/Progress Updates:  Patient seated upright in recliner on entrance to room. BLE propped on bed. Patient alert and not initially agreeable to PT session d/t increased pain complaint. Odd for pt as he normally has no indication of pain despite relation of consistent 7/10 pain.   Requesting to limit activity until he can receive additional pain medication. RN notified and able to bring robaxin  but more time required prior to ability to provide pain medication. Pt does c/o muscle spasming.   Acquired ice for polar ice machine and fitted to pt, turned on with good circulation of cold water  through pad placement. Positioned BLE in elevated position in recliner in order to reduce swelling. Returned in 15 min and pt relates improvement in pain symptoms. Request to maintain until 12:30 and returned then to remove prior to 1pm PT session.   Patient seated in recliner with BLE elevated at end of session with brakes locked, seat pad alarm set, and all needs within reach.   Therapy Documentation Precautions:  Precautions Precautions: Knee, Fall Precaution Comments: R TKA (need to rest in extension), R hemi/inattention, Expressive aphasia, CPM to R knee at least once daily Restrictions Weight Bearing Restrictions Per Provider Order: Yes RLE Weight Bearing Per Provider Order: Weight bearing as tolerated  Pain: Pain Assessment Pain Scale: 0-10 Pain Score: 7  Pain Type: Surgical pain Pain Location: Knee Pain Orientation: Right Pain Descriptors / Indicators:  Aching Pain Onset: On-going Pain Intervention(s): Other (Comment) (received medication)  Therapy/Group: Individual Therapy  Mliss DELENA Milliner PT, DPT, CSRS 01/19/2023, 10:32 AM

## 2023-01-19 NOTE — Progress Notes (Signed)
 Occupational Therapy Session Note  Patient Details  Name: Joshua [name] MRN: [id number] Date of Birth: 1945-07-21  Today's Date: 01/19/2023 OT Individual Time: 9054-8969 OT Individual Time Calculation (min): 45 min    Short Term Goals: Week 1:  OT Short Term Goal 1 (Week 1): STGs=LTGs due to patient's estimated length of stay.  Skilled Therapeutic Interventions/Progress Updates:    Pt received in recliner and ready for therapy. Pt eager to shower.  Pt stood to RW with CGA and then ambulated to bathroom demonstrating good control of his RLE.  Pt needs cues to push up with hands from chair vs pushing up on walker handles and also cues to reach back for the chair.  He continued to need these reminders through out the session.  Pt completed his shower and dressing with min A to manage washing feet and donning clothing over feet.  Pt would benefit from practice with AD.  He states he has all of that equipment at home.   Pt ambulated back to recliner at end of session with all needs met.   Therapy Documentation Precautions:  Precautions Precautions: Knee, Fall Precaution Comments: R TKA (need to rest in extension), R hemi/inattention, Expressive aphasia, CPM to R knee at least once daily Restrictions Weight Bearing Restrictions Per Provider Order: Yes RLE Weight Bearing Per Provider Order: Weight bearing as tolerated   Pain: Pain Assessment Pain Score: 7  Pain Type: Surgical pain Pain Location: Knee Pain Orientation: Right Pain Descriptors / Indicators: Aching Pain Onset: On-going Pain Intervention(s): Other (Comment) (received medication) ADL: ADL Eating: Set up Where Assessed-Eating: Bed level Grooming: Setup Where Assessed-Grooming: Sitting at sink Upper Body Bathing: Supervision/safety Where Assessed-Upper Body Bathing: Shower Lower Body Bathing: Minimal assistance Where Assessed-Lower Body Bathing: Shower Upper Body Dressing: Supervision/safety Where Assessed-Upper  Body Dressing: Chair Lower Body Dressing: Minimal assistance Where Assessed-Lower Body Dressing: Chair Toileting: Minimal assistance Where Assessed-Toileting: Teacher, Adult Education: Furniture Conservator/restorer Method: Proofreader: Bedside commode, Other (comment) (RW) Tub/Shower Transfer: Unable to assess Film/video Editor: Administrator, Arts Method: Designer, Industrial/product: Information systems manager with back, Grab bars   Therapy/Group: Individual Therapy  Shadd Dunstan 01/19/2023, 12:54 PM

## 2023-01-19 NOTE — Progress Notes (Signed)
 PROGRESS NOTE   Subjective/Complaints:  Some pain in R knee but slept well , plans to take pain meds prior to therapy this am , appetite is good, no abd pain   ROS:  Denies CP, SOB, , N/V/D/C, or any other complaints at this time.    Objective:   No results found. Recent Labs    01/18/23 0527  WBC 7.9  HGB 12.1*  HCT 36.8*  PLT 275   Recent Labs    01/18/23 0527  NA 135  K 4.6  CL 103  CO2 23  GLUCOSE 102*  BUN 20  CREATININE 0.97  CALCIUM  8.9      Intake/Output Summary (Last 24 hours) at 01/19/2023 9247 Last data filed at 01/18/2023 1835 Gross per 24 hour  Intake 338 ml  Output 150 ml  Net 188 ml     Pressure Injury 01/15/23 Thigh Anterior;Right Stage 1 -  Intact skin with non-blanchable redness of a localized area usually over a bony prominence. long reddened area likely from contact with ice pack or ted hose (Active)  01/15/23 1357  Location: Thigh  Location Orientation: Anterior;Right  Staging: Stage 1 -  Intact skin with non-blanchable redness of a localized area usually over a bony prominence.  Wound Description (Comments): long reddened area likely from contact with ice pack or ted hose  Present on Admission: Yes    Physical Exam: Vital Signs Blood pressure 132/74, pulse 75, temperature 98.3 F (36.8 C), resp. rate 18, height 6' 1 (1.854 m), weight 102.2 kg, SpO2 97%.   General: No acute distress Mood and affect are appropriate Heart: Regular rate and rhythm no rubs murmurs or extra sounds Lungs: Clear to auscultation, breathing unlabored, no rales or wheezes Abdomen: Positive bowel sounds, soft nontender to palpation, nondistended Extremities:RLE swelling in the knee area, no thigh tenderness on R side  Skin: No evidence of breakdown, no evidence of rash, small spot 2cm of dried blood on post op dressing   PRIOR EXAMS: Neurological:     Mental Status: He is alert and oriented to person,  place, and time.     Comments: Pt is alert, oriented to person, place, month/year. Needed some help with day. Language is non-fluent but with extra time he is able to communicate needs with words and short sentences. Mild right hemiparesis RUE: 4+/5, RLE 3- HF, KE, 4/5 ankle DF (confounded by RTKA). LUE and LLE  5/5. No focal sensory loss. Cog wheeling rigidity with resting tremor noted in BUE . [name] Normal resting tone. DTR's trace to 1+    Assessment/Plan: 1. Functional deficits which require 3+ hours per day of interdisciplinary therapy in a comprehensive inpatient rehab setting. Physiatrist is providing close team supervision and 24 hour management of active medical problems listed below. Physiatrist and rehab team continue to assess barriers to discharge/monitor patient progress toward functional and medical goals  Care Tool:  Bathing    Body parts bathed by patient: Right arm, Chest, Abdomen, Front perineal area, Right upper leg, Left upper leg, Face, Left arm, Right lower leg, Left lower leg   Body parts bathed by helper: Buttocks, Right lower leg, Left lower leg  Bathing assist Assist Level: Minimal Assistance - Patient > 75%     Upper Body Dressing/Undressing Upper body dressing   What is the patient wearing?: Pull over shirt    Upper body assist Assist Level: Minimal Assistance - Patient > 75%    Lower Body Dressing/Undressing Lower body dressing      What is the patient wearing?: Incontinence brief, Pants     Lower body assist Assist for lower body dressing: Moderate Assistance - Patient 50 - 74%     Toileting Toileting Toileting Activity did not occur Press Photographer and hygiene only): N/A (no void or bm)  Toileting assist Assist for toileting: Minimal Assistance - Patient > 75%     Transfers Chair/bed transfer  Transfers assist     Chair/bed transfer assist level: Minimal Assistance - Patient > 75%     Locomotion Ambulation   Ambulation  assist      Assist level: Minimal Assistance - Patient > 75% Assistive device: Walker-rolling Max distance: 200 ft   Walk 10 feet activity   Assist     Assist level: Contact Guard/Touching assist Assistive device: Walker-rolling   Walk 50 feet activity   Assist    Assist level: Contact Guard/Touching assist Assistive device: Walker-rolling    Walk 150 feet activity   Assist    Assist level: Minimal Assistance - Patient > 75% Assistive device: Walker-rolling    Walk 10 feet on uneven surface  activity   Assist Walk 10 feet on uneven surfaces activity did not occur: Safety/medical concerns         Wheelchair     Assist Is the patient using a wheelchair?: Yes Type of Wheelchair: Manual    Wheelchair assist level: Dependent - Patient 0% Max wheelchair distance: 200 ft    Wheelchair 50 feet with 2 turns activity    Assist        Assist Level: Dependent - Patient 0%   Wheelchair 150 feet activity     Assist      Assist Level: Dependent - Patient 0%   Blood pressure 132/74, pulse 75, temperature 98.3 F (36.8 C), resp. rate 18, height 6' 1 (1.854 m), weight 102.2 kg, SpO2 97%.  Medical Problem List and Plan: 1. 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             -patient may shower -ELOS/Goals: 12-14 days, goals supervision with PT, OT and min-sup with SLP   -Continue CIR  2.  Antithrombotics: -DVT/anticoagulation:  Mechanical: Sequential compression devices, below knee Right lower extremity in addition to below.  -LE dopplers 01/13/23 were clear. -antiplatelet therapy: Aspirin  and Brilinta  for 30 days followed by aspirin  alone. Last dose of Brilinta : 02/11/2023.   3. Pain Management: Tylenol , Robaxin , oxycodone  as needed   4. Mood/Behavior/Sleep: LCSW to evaluate and provide emotional support              -continue Ambien  10 mg q HS             -continue Lexapro  20 daily             -antipsychotic agents: n/a   5. Neuropsych/cognition: This patient is capable of making decisions on his own behalf.   6. Skin/Wound Care: Routine skin care checks             -maintain Aquacel surgical dressing   7. Fluids/Electrolytes/Nutrition: Routine Is and Os  and follow-up chemistries    Vitals:   01/15/23 1401 01/15/23 1954 01/16/23 0301 01/16/23 1515  BP: 126/72 115/75 124/80 126/81   01/16/23 1933 01/17/23 0320 01/17/23 1359 01/17/23 2018  BP: 120/81 (!) 145/77 107/69 132/70   01/18/23 0435 01/18/23 1422 01/18/23 1933 01/19/23 0501  BP: (!) 140/69 115/71 125/71 132/74     9: Hyperlipidemia: continue rosuvastatin  20mg  daily   10: BPH: continue Flomax  0.4mg  daily and Proscar  5mg  daily   11: Parkinson disease, possibly ET/PD:  -Sinemet  25/100, 2 tablets at 7 AM/2 tablets at 10 AM/2 at 1pm/2 at 4pm/2 at 7pm and 50/200 at bedtime -continue ropinirole  1 mg TID for rigidity -follows with Dr. Evonnie   12: Allergic rhinitis: continue Zaditor  gtts, Astelin  spray, Flonase , Claritin  10mg  daily   13: Asthma: continue Singulair  10mg  daily   14: History of CP, NSTEMI, CAD, RBBB and high burden of PVCs s/p Link monitor in past -continue mexiletine 150 mg BID -Plavix  discontinued and now on asa and Brilinta  -continue Crestor  20 mg daily -follows with Dr. Jeffrie   15: Mexiletine GERD/Barrett's esophagus: Protonix    16: Primary OA of right knee s/p TKA 01/11/2023 (previous left TKA)             -maintain zero degree knee bone foam 10-15 minutes TID             -range of motion in bed/with therapy  CPM Comp hose  17: Mild ABLA: follow-up CBC      Latest Ref Rng & Units 01/18/2023    5:27 AM 01/16/2023    6:12 AM 01/14/2023    7:04 AM  CBC  WBC 4.0 - 10.5 K/uL 7.9  8.2  8.3   Hemoglobin 13.0 - 17.0 g/dL 87.8  87.6  87.9   Hematocrit 39.0 - 52.0 % 36.8  37.6  36.2   Platelets 150 - 400 K/uL 275  252   197       18: Anti-constipation: continue colace 100 mg BID; prns ordered  -01/17/23 LBM , increase colace to 200mg     19: History of chronic intermittent hypotension:             -continue midodrine  5 mg BID for systolic BP < 100  Flomax  may be contributing will check PVR see if we can hold   LOS: 4 days A FACE TO FACE EVALUATION WAS PERFORMED  Prentice FORBES Compton 01/19/2023, 7:52 AM

## 2023-01-20 ENCOUNTER — Inpatient Hospital Stay (HOSPITAL_COMMUNITY): Payer: Medicare HMO

## 2023-01-20 ENCOUNTER — Encounter (HOSPITAL_COMMUNITY): Payer: Self-pay | Admitting: Orthopedic Surgery

## 2023-01-20 DIAGNOSIS — Z96651 Presence of right artificial knee joint: Secondary | ICD-10-CM | POA: Diagnosis not present

## 2023-01-20 DIAGNOSIS — G20B1 Parkinson's disease with dyskinesia, without mention of fluctuations: Secondary | ICD-10-CM | POA: Diagnosis not present

## 2023-01-20 DIAGNOSIS — N400 Enlarged prostate without lower urinary tract symptoms: Secondary | ICD-10-CM | POA: Diagnosis not present

## 2023-01-20 DIAGNOSIS — I63412 Cerebral infarction due to embolism of left middle cerebral artery: Secondary | ICD-10-CM | POA: Diagnosis not present

## 2023-01-20 NOTE — Progress Notes (Signed)
 PROGRESS NOTE   Subjective/Complaints:  Did not report ankle pain on rounds but c/o with PT, xray ordered by PA and is pending   ROS:  Denies CP, SOB, , N/V/D/C, or any other complaints at this time.    Objective:   No results found. Recent Labs    01/18/23 0527  WBC 7.9  HGB 12.1*  HCT 36.8*  PLT 275   Recent Labs    01/18/23 0527  NA 135  K 4.6  CL 103  CO2 23  GLUCOSE 102*  BUN 20  CREATININE 0.97  CALCIUM  8.9      Intake/Output Summary (Last 24 hours) at 01/20/2023 1012 Last data filed at 01/20/2023 0700 Gross per 24 hour  Intake 1051 ml  Output --  Net 1051 ml     Pressure Injury 01/15/23 Thigh Anterior;Right Stage 1 -  Intact skin with non-blanchable redness of a localized area usually over a bony prominence. long reddened area likely from contact with ice pack or ted hose (Active)  01/15/23 1357  Location: Thigh  Location Orientation: Anterior;Right  Staging: Stage 1 -  Intact skin with non-blanchable redness of a localized area usually over a bony prominence.  Wound Description (Comments): long reddened area likely from contact with ice pack or ted hose  Present on Admission: Yes    Physical Exam: Vital Signs Blood pressure (!) 103/58, pulse 67, temperature 98.1 F (36.7 C), resp. rate 17, height 6' 1 (1.854 m), weight 102.2 kg, SpO2 94%.   General: No acute distress Mood and affect are appropriate Heart: Regular rate and rhythm no rubs murmurs or extra sounds Lungs: Clear to auscultation, breathing unlabored, no rales or wheezes Abdomen: Positive bowel sounds, soft nontender to palpation, nondistended Extremities:Joshua [name] swelling in the knee area, no thigh tenderness on R side  Skin: No evidence of breakdown, no evidence of rash, small spot 2cm of dried blood on post op dressing   PRIOR EXAMS: Neurological:     Mental Status: He is alert and oriented to person, place, and time.      Comments: Pt is alert, oriented to person, place, month/year. Needed some help with day. Language is non-fluent but with extra time he is able to communicate needs with words and short sentences. Mild right hemiparesis Joshua [name]: 4+/5, Joshua [name] 3- HF, KE, 4/5 ankle DF (confounded by RTKA). Joshua [name] and Joshua [name]  5/5. No focal sensory loss. Cog wheeling rigidity with resting tremor noted in BUE . [name] Normal resting tone. DTR's trace to 1+    Assessment/Plan: 1. Functional deficits which require 3+ hours per day of interdisciplinary therapy in a comprehensive inpatient rehab setting. Physiatrist is providing close team supervision and 24 hour management of active medical problems listed below. Physiatrist and rehab team continue to assess barriers to discharge/monitor patient progress toward functional and medical goals  Care Tool:  Bathing    Body parts bathed by patient: Right arm, Chest, Abdomen, Front perineal area, Right upper leg, Left upper leg, Face, Left arm, Right lower leg, Left lower leg   Body parts bathed by helper: Buttocks, Right lower leg, Left lower leg     Bathing assist Assist Level: Minimal Assistance -  Patient > 75%     Upper Body Dressing/Undressing Upper body dressing   What is the patient wearing?: Pull over shirt    Upper body assist Assist Level: Minimal Assistance - Patient > 75%    Lower Body Dressing/Undressing Lower body dressing      What is the patient wearing?: Incontinence brief, Pants     Lower body assist Assist for lower body dressing: Moderate Assistance - Patient 50 - 74%     Toileting Toileting Toileting Activity did not occur Press Photographer and hygiene only): N/A (no void or bm)  Toileting assist Assist for toileting: Minimal Assistance - Patient > 75%     Transfers Chair/bed transfer  Transfers assist     Chair/bed transfer assist level: Minimal Assistance - Patient > 75%     Locomotion Ambulation   Ambulation assist      Assist  level: Minimal Assistance - Patient > 75% Assistive device: Walker-rolling Max distance: 200 ft   Walk 10 feet activity   Assist     Assist level: Contact Guard/Touching assist Assistive device: Walker-rolling   Walk 50 feet activity   Assist    Assist level: Contact Guard/Touching assist Assistive device: Walker-rolling    Walk 150 feet activity   Assist    Assist level: Minimal Assistance - Patient > 75% Assistive device: Walker-rolling    Walk 10 feet on uneven surface  activity   Assist Walk 10 feet on uneven surfaces activity did not occur: Safety/medical concerns         Wheelchair     Assist Is the patient using a wheelchair?: Yes Type of Wheelchair: Manual    Wheelchair assist level: Dependent - Patient 0% Max wheelchair distance: 200 ft    Wheelchair 50 feet with 2 turns activity    Assist        Assist Level: Dependent - Patient 0%   Wheelchair 150 feet activity     Assist      Assist Level: Dependent - Patient 0%   Blood pressure (!) 103/58, pulse 67, temperature 98.1 F (36.7 C), resp. rate 17, height 6' 1 (1.854 m), weight 102.2 kg, SpO2 94%.  Medical Problem List and Plan: 1. 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             -patient may shower -ELOS/Goals: 12-14 days, goals supervision with PT, OT and min-sup with SLP   -Continue CIR  2.  Antithrombotics: -DVT/anticoagulation:  Mechanical: Sequential compression devices, below knee Right lower extremity in addition to below.  -LE dopplers 01/13/23 were clear. -antiplatelet therapy: Aspirin  and Brilinta  for 30 days followed by aspirin  alone. Last dose of Brilinta : 02/11/2023.   3. Pain Management: Tylenol , Robaxin , oxycodone  as needed   4. Mood/Behavior/Sleep: LCSW to evaluate and provide emotional support             -continue Ambien   10 mg q HS             -continue Lexapro  20 daily             -antipsychotic agents: n/a   5. Neuropsych/cognition: This patient is capable of making decisions on his own behalf.   6. Skin/Wound Care: Routine skin care checks             -maintain Aquacel surgical dressing   7. Fluids/Electrolytes/Nutrition: Routine Is and Os and follow-up chemistries  Vitals:   01/16/23 1515 01/16/23 1933 01/17/23 0320 01/17/23 1359  BP: 126/81 120/81 (!) 145/77 107/69   01/17/23 2018 01/18/23 0435 01/18/23 1422 01/18/23 1933  BP: 132/70 (!) 140/69 115/71 125/71   01/19/23 0501 01/19/23 1518 01/19/23 2001 01/20/23 0507  BP: 132/74 134/78 123/74 (!) 103/58     9: Hyperlipidemia: continue rosuvastatin  20mg  daily   10: BPH: continue Flomax  0.4mg  daily and Proscar  5mg  daily   11: Parkinson disease, possibly ET/PD:  -Sinemet  25/100, 2 tablets at 7 AM/2 tablets at 10 AM/2 at 1pm/2 at 4pm/2 at 7pm and 50/200 at bedtime -continue ropinirole  1 mg TID for rigidity -follows with Dr. Evonnie   12: Allergic rhinitis: continue Zaditor  gtts, Astelin  spray, Flonase , Claritin  10mg  daily   13: Asthma: continue Singulair  10mg  daily   14: History of CP, NSTEMI, CAD, RBBB and high burden of PVCs s/p Link monitor in past -continue mexiletine 150 mg BID -Plavix  discontinued and now on asa and Brilinta  -continue Crestor  20 mg daily -follows with Dr. Jeffrie   15: Mexiletine GERD/Barrett's esophagus: Protonix    16: Primary OA of right knee s/p TKA 01/11/2023 (previous left TKA)             -maintain zero degree knee bone foam 10-15 minutes TID             -range of motion in bed/with therapy  CPM Comp hose  17: Mild ABLA: follow-up CBC      Latest Ref Rng & Units 01/18/2023    5:27 AM 01/16/2023    6:12 AM 01/14/2023    7:04 AM  CBC  WBC 4.0 - 10.5 K/uL 7.9  8.2  8.3   Hemoglobin 13.0 - 17.0 g/dL 87.8  87.6  87.9   Hematocrit 39.0 - 52.0 % 36.8  37.6  36.2   Platelets 150 - 400 K/uL 275  252  197        18: Anti-constipation: continue colace 100 mg BID; prns ordered  -01/17/23 LBM , increase colace to 200mg     19: History of chronic intermittent hypotension:             -continue midodrine  5 mg BID for systolic BP < 100  Flomax  may be contributing will check PVR see if we can hold   LOS: 5 days A FACE TO FACE EVALUATION WAS PERFORMED  Joshua [name] 01/20/2023, 10:12 AM

## 2023-01-20 NOTE — Progress Notes (Signed)
 Working with PT this morning and reported right ankle pain. He says he has noticed this gradually increasing and worse with weight-bearing. On exam, no erythema or instability. Mild tenderness to palpation along medial malleolus. Mild RLE edema. Surgical dressing in place with small quarter-size area of strike-through. Will check right ankle x-ray and allow therapy/WB as tolerated.

## 2023-01-20 NOTE — Progress Notes (Signed)
 Occupational Therapy Session Note  Patient Details  Name: Joshua [name] MRN: [id number] Date of Birth: 10-Feb-1945  Today's Date: 01/20/2023 OT Individual Time: 8948-8794 OT Individual Time Calculation (min): 74 min    Short Term Goals: Week 1:  OT Short Term Goal 1 (Week 1): STGs=LTGs due to patient's estimated length of stay.  Skilled Therapeutic Interventions/Progress Updates:  Pt greeted seated in recliner , pt agreeable to OT intervention.      Transfers/bed mobility/functional mobility: pt completed all functional ambulation with RW and CGA- supervision with w/c follow.   Therapeutic activity: worked on dynamic standing balance with both unilateral and no UE support to challenge balance for ADLS. Pt able to reach to shoulder height to remove horseshoes placed over rim of board with CGA. Noted ataxia in RUE however pt reports this is baseline. Attempted same task with weights to decrease ataxia however no improvements noted. Pt familiar with common compensatory methods for temors such as providing proximal support however no accommodations seemed to assist pt.   Pt was able to stand at hi lo table to fold wash cloths with no UE support to promote bimanual tasks. Pt completed task with CGA and no LOB.   Pt does endorse double vision in R eye as pt noted to have difficulty with visual perception tasks during ADLs. Had pt complete unilateral peg board task to further assess vision with RUE however pt with great difficulty manipulating pieces d/t ataxia and impaired sensation in R hand. Pt noted to become mildly frustrated therefore terminated task.    ADLs:  Grooming: pt able to stand at sink for oral care with supervision, pt does struggle to manage caps but able to complete with + time, pt reports his wife is getting him a toothbrush that is easier to use UB dressing:donned [place] shirt with set-up assist. MIN cues needed for correct orientation of shirt.  LB dressing: pt donned  pants and brief with use of reacher and MIN A to thread RLE into pants, MIN verbal cues needed to sequence using novel AE I.e reacher Footwear: donned slip on shoes with MIN A.   Bathing: pt completed bathing at sink with MIN A for cleanliness  Transfers: pt completed ambulatory transfer into bathroom with RW and CGA Toileting: CGA for 3/3 toileting tasks, continent urine void.    Ended session with pt seated in recliner with all needs within reach and chair alarm activated.                   Therapy Documentation Precautions:  Precautions Precautions: Knee, Fall Precaution Comments: R TKA (need to rest in extension), R hemi/inattention, Expressive aphasia, CPM to R knee at least once daily Restrictions Weight Bearing Restrictions Per Provider Order: Yes RLE Weight Bearing Per Provider Order: Weight bearing as tolerated  Pain: no pain     Therapy/Group: Individual Therapy  Ronal Gift [facility] 01/20/2023, 12:20 PM

## 2023-01-20 NOTE — Patient Care Conference (Signed)
 Inpatient RehabilitationTeam Conference and Plan of Care Update Date: 01/20/2023   Time: 10:13 AM    Patient Name: Joshua [name]      Medical Record Number: [id number]  Date of Birth: [birth year] Sex: Male         Room/Bed: 4M09C/4M09C-01 Payor Info: Payor: [employer] MEDICARE / Plan: [employer] MEDICARE HMO/PPO / Product Type: *No Product type* /    Admit Date/Time:  01/15/2023  1:10 PM  Primary Diagnosis:  CVA (cerebral vascular accident) [facility])  Hospital Problems: Principal Problem:   CVA (cerebral vascular accident) [facility])    Expected Discharge Date: Expected Discharge Date: 01/27/23  Team Members Present: Physician leading conference: Dr. Prentice Compton Nurse Present: Barnie Ronde, Darlin Boring, RN PT Present: Recardo Milliner, PT OT Present: Delon Sharps, OT SLP Present: Blaise Alderman, SLP PPS Coordinator present : Eleanor Colon, SLP     Current Status/Progress Goal Weekly Team Focus  Bowel/Bladder    Continent of bowel and bladder. Flomax  and Proscar   Remain continent  Toilet every 4 hours and as needed.   Swallow/Nutrition/ Hydration               ADL's   min A with LB self care, CGA with ambulation with RW for ADL transfers   supervision with self care except for CGA with walk in shower   use of AE to increase independence with self care,  dynamic balance to increase safety with self care, pt education    Mobility   Bed mobility = supervision/ Mod I; Transfers = supervision/ Mod I with vc for R hand position/ placement; ambulation = close supervision   overall supervision  Barriers: pain /// Work on: R hemibody awareness, strengthening overall, family education, activity tolerance, standing balance    Communication   min to mod A for word finding, false starts, hesitations   min a   implementation of circumlocution strategies    Safety/Cognition/ Behavioral Observations  mod A- memory, problem solving, awareness, attention   min A   awareness,  atttention, recall and strategies.    Pain    10/10 pain to right knee, Oxycodone , Tylenol , Robaxin  PRN and ice    Less than 4 with PRN medication use  Assess every 4 hours and PRN    Skin    Incision to right knee with hydrocolloid dressing   Incision to remain intact and free of infection  Assess every shift and PRN      Discharge Planning:  Pt will d/c to home with his wife who will be primary caregiver. SW will confirm there are no barriers to discharge.   Team Discussion: CVA s/p Right TKR.  Left TKR in September that is well healed.  New right ankle pain. Blood pressure good. Mild tremors.   Hydrocolloid dressing to remain in place until follow up.  Using CPM.  Focusing on using AE to increase independence with self care,  mobility, and dynamic balance. Requires cues for right hand placement/awareness. Working on land recall with strategies, awareness and attention to right side. Tolerating a regular diet.  Patient on target to meet rehab goals: yes, progressing towards goals with discharge date of 01/27/23  *See Care Plan and progress notes for long and short-term goals.   Revisions to Treatment Plan:  Xray ordered of right ankle.  Family to bring in brace from home.monitor labs and VS Teaching Needs: Medications, self care, safety, gait/transfer training, incision care, etc.   Current Barriers to Discharge: Decreased caregiver support, Wound  care, and Weight  Possible Resolutions to Barriers: Family education Knowing s/s of infection Diet/lifestyle modifications to improve weight and overall health Order recommended DME     Medical Summary Current Status: parkinson's disease , R TKR, recent left TKR, Right hemiparesis from CVA  Barriers to Discharge: Uncontrolled Pain   Possible Resolutions to Barriers/Weekly Focus: increae awareness to left, xray R ankle pnd   Continued Need for Acute Rehabilitation Level of Care: The patient requires daily  medical management by a physician with specialized training in physical medicine and rehabilitation for the following reasons: Direction of a multidisciplinary physical rehabilitation program to maximize functional independence : Yes Medical management of patient stability for increased activity during participation in an intensive rehabilitation regime.: Yes Analysis of laboratory values and/or radiology reports with any subsequent need for medication adjustment and/or medical intervention. : Yes   I attest that I was present, lead the team conference, and concur with the assessment and plan of the team.   Darice LITTIE Boring 01/20/2023, 1:46 PM

## 2023-01-20 NOTE — Progress Notes (Signed)
 Speech Language Pathology Daily Session Note  Patient Details  Name: Joshua [name] MRN: [id number] Date of Birth: 1945/10/26  Today's Date: 01/20/2023 SLP Individual Time: 8596-8554 SLP Individual Time Calculation (min): 42 min  Short Term Goals: Week 1: SLP Short Term Goal 1 (Week 1): Patient will utilize compensatory strategies to increase word finding of abstract thoughts during conversation given mod multimodal A SLP Short Term Goal 2 (Week 1): Patient will demonstrate problem solving abilities during basic-mildly complex functional daily situations given mod multimodal A SLP Short Term Goal 3 (Week 1): Patient will recall and utilize memory compensatory strategies given mod multimodal A SLP Short Term Goal 4 (Week 1): Patient will demonstrate attention to R during functional tasks given mod multimodal A SLP Short Term Goal 5 (Week 1): Patient will demonstrate awareness of errors during functional tasks given mod multimodal A  Skilled Therapeutic Interventions: Skilled therapy session focused on communication and cognitive goals. Patient unable to recall speech nor memory strategies taught in prior sessions, therefore SLP re-educated. Due to memory deficits, SLP initiated use of memory book. Patient recalled activities completed in todays session given minA. To continue to address cognitive goals, SLP aided patient in use of phone. Patient called wife given verbal command to google with minA. During conversation with wife, patient with occasional false starts and hestiations, though wife reporting speech has improved. At end of session, patient independently recalled activites completed in ST session and  completed teach back of strategies. Patient left in chair with alarm set and call bell in reach. Continue POC.  Pain None reported   Therapy/Group: Individual Therapy  Denyse Fillion M.A., CF-SLP 01/20/2023, 7:44 AM

## 2023-01-20 NOTE — Progress Notes (Signed)
 Physical Therapy Session Note  Patient Details  Name: Joshua [name] MRN: [id number] Date of Birth: [birth year]  Today's Date: 01/20/2023 PT Individual Time: 0850-1004 PT Individual Time Calculation (min): 74 min   Short Term Goals: Week 1:  PT Short Term Goal 1 (Week 1): STG = LTG d/t ELOS  Skilled Therapeutic Interventions/Progress Updates: Patient supine in bed on entrance to room. Patient alert and agreeable to PT session.   Patient reported 7/10 pain in R knee, and unrated pain in R ankle with noted swelling compared to L ankle (no discoloration observed). Attending care team made aware with PA arriving during session to assess. PA stated standing activities to tolerance is okay, and will have x-ray order placed.   Therapeutic Activity: Bed Mobility: Pt performed supine<sit on EOB with HOB elevated and supervision (use of bed features). Transfers: Pt performed sit<>stand transfers throughout session with RW and with close supervision for safety. VC required for hand placement vs B UE on RW  Neuromuscular Re-ed: NMR facilitated during session with focus on R UE coordination and dynamic standing balance. - grasping for pincher pins on basketball net with R UE (Blue) while standing and with CGA for safety. Pt required increased time/effort to grasp pins due to parkinson's presentation on R hand while coordinating movement, and min/modA to reposition. Pt with decreased coordination in R hand with blue pins vs yellow pins (when pinching them to transition to the R and place on exercise bars).    NMR performed for improvements in motor control and coordination, balance, sequencing, judgement, and self confidence/ efficacy in performing all aspects of mobility at highest level of independence.   Therapeutic Exercise: Pt performed the following exercises with therapist providing the described cuing and facilitation for improvement.  - LAQ on R LE x 10 wit 4lb ankle weight - progressed to  2 x 10 with 7lb ankle weight. VC required for 1-2 second isometric holds with eccentric control.  - R hip flexion in short sitting with pt posteriorly leaning to increase R hip flexor activation  - Pt ambulated around day room/nsg loop (175') in RW with CGA for safety. Pt performed gait trial to increase WB on R knee, but required seated rest due to 8-9/10 pain in R knee and ankle  - Pt supine in hi/low mat with R ankle propped on yoga block, and 7lb ankle weight on R knee to increase ROM in R knee extension (end of session)   Patient  at end of session with brakes locked, chair alarm set, and all needs within reach.      Therapy Documentation Precautions:  Precautions Precautions: Knee, Fall Precaution Comments: R TKA (need to rest in extension), R hemi/inattention, Expressive aphasia, CPM to R knee at least once daily Restrictions Weight Bearing Restrictions Per Provider Order: Yes RLE Weight Bearing Per Provider Order: Weight bearing as tolerated  Therapy/Group: Individual Therapy  Alver Leete PTA 01/20/2023, 11:51 AM

## 2023-01-20 NOTE — Progress Notes (Signed)
 Patient ID: Joshua [name], male   DOB: 1945-02-18, 78 y.o.   MRN: [id number]  1123- SW spoke with wife [name] to provide updates from team conference, inform on d/c date 1/15, and d/ recs- HH therapies. Pt wife would like ot remain with [facility]. Fam edu scheduled for Friday (1/10) 1pm-4pm.   SW updated [facility] to inform on pt d/c date, and family desire to resume services. SW will send order for HHPT/OT/SLP/aide.   SW met with pt in room to inform on above. He has carpal tunnel splint in room, and wearing.   SW sent orders to [facility].   Graeme Jude, MSW, LCSW Office: [phone] Cell: [phone] Fax: [phone]

## 2023-01-21 DIAGNOSIS — I63412 Cerebral infarction due to embolism of left middle cerebral artery: Secondary | ICD-10-CM | POA: Diagnosis not present

## 2023-01-21 DIAGNOSIS — N400 Enlarged prostate without lower urinary tract symptoms: Secondary | ICD-10-CM | POA: Diagnosis not present

## 2023-01-21 DIAGNOSIS — Z96651 Presence of right artificial knee joint: Secondary | ICD-10-CM | POA: Diagnosis not present

## 2023-01-21 DIAGNOSIS — G20B1 Parkinson's disease with dyskinesia, without mention of fluctuations: Secondary | ICD-10-CM | POA: Diagnosis not present

## 2023-01-21 NOTE — Progress Notes (Signed)
 Speech Language Pathology Weekly Progress and Session Note  Patient Details  Name: Joshua [name] MRN: [id number] Date of Birth: [birth year]  Beginning of progress report period: January 16, 2023 End of progress report period: January 21, 2023  Short Term Goals: Week 1: SLP Short Term Goal 1 (Week 1): Patient will utilize compensatory strategies to increase word finding of abstract thoughts during conversation given mod multimodal A SLP Short Term Goal 1 - Progress (Week 1): Met SLP Short Term Goal 2 (Week 1): Patient will demonstrate problem solving abilities during basic-mildly complex functional daily situations given mod multimodal A SLP Short Term Goal 2 - Progress (Week 1): Met SLP Short Term Goal 3 (Week 1): Patient will recall and utilize memory compensatory strategies given mod multimodal A SLP Short Term Goal 3 - Progress (Week 1): Met SLP Short Term Goal 4 (Week 1): Patient will demonstrate attention to R during functional tasks given mod multimodal A SLP Short Term Goal 4 - Progress (Week 1): Met SLP Short Term Goal 5 (Week 1): Patient will demonstrate awareness of errors during functional tasks given mod multimodal A SLP Short Term Goal 5 - Progress (Week 1): Met SLP Short Term Goal 6 (Week 1): STG = LTG due to ELOS    New Short Term Goals: Week 2: SLP Short Term Goal 1 (Week 2): STG = LTG due to ELOS  Weekly Progress Updates: Pt has made excellent gains and has met 5 of 5 STGs this reporting period due to improved communication and cognition. Currently, patient continues to require min A for expressive language tasks and use of word finding strategies. Patient continues to benefit from min-mod A during memory, problem solving and awareness tasks. Pt/family eduction ongoing. Pt would benefit from continued ST intervention to maximize expressive language and cognition in order to maximize functional independence at d/c.   Intensity: Minumum of 1-2 x/day, 30 to 90  minutes Frequency: 3 to 5 out of 7 days Duration/Length of Stay: 1/15 Treatment/Interventions: Cognitive remediation/compensation;Internal/external aids;Speech/Language facilitation;Cueing hierarchy;Therapeutic Activities;DME/adaptive equipment instruction;Functional tasks;Patient/family education;Therapeutic Exercise;Multimodal communication approach   Yasin Ducat M.A., CF-SLP 01/21/2023, 8:51 PM

## 2023-01-21 NOTE — Progress Notes (Signed)
 Physical Therapy Weekly Progress Note  Patient Details  Name: Joshua [name] MRN: [id number] Date of Birth: 11/24/1945  Beginning of progress report period: January 16, 2023 End of progress report period: January 22, 2023  Pt is progressing well toward LTGs. He demonstrates supervision with all transfers using RW,  independence with bed mobility and is able to ambulate >300' using RW with supervision. He is currently limited by decreased ROM  and an increase in pain in the R LE following recent TKA. Also, pt is able to navigte 1 curb step with RW and supervision for navigation of four 6 steps with BUE support. Pt is overall supervision for community ambulation using RW.  Patient continues to demonstrate the following deficits muscle weakness and muscle joint tightness, decreased cardiorespiratoy endurance, decreased coordination, decreased memory, and decreased balance strategies and therefore will continue to benefit from skilled PT intervention to increase functional independence with mobility.  Patient progressing toward long term goals..  Continue plan of care.  PT Short Term Goals Week 1:  PT Short Term Goal 1 (Week 1): STG = LTG d/t ELOS  Skilled Therapeutic Interventions/Progress Updates:  Community reintegration;Ambulation/gait training;Balance/vestibular training;Cognitive remediation/compensation;Disease management/prevention;Discharge planning;DME/adaptive equipment instruction;Neuromuscular re-education;Psychosocial support;Stair training;UE/LE Strength taining/ROM;Pain management;Skin care/wound management;Therapeutic Activities;UE/LE Coordination activities;Visual/perceptual remediation/compensation;Therapeutic Exercise;Splinting/orthotics;Functional mobility training;Patient/family education   Therapy Documentation Precautions:  Precautions Precautions: Knee, Fall Precaution Comments: R TKA (need to rest in extension), R hemi/inattention, Expressive aphasia, CPM to R  knee at least once daily Restrictions Weight Bearing Restrictions Per Provider Order: Yes RLE Weight Bearing Per Provider Order: Weight bearing as tolerated  Dominic Sandoval PTA 01/22/2023, 7:48 AM   Mliss DELENA Milliner PT, DPT, CSRS 01/25/2023, 5:29 AM

## 2023-01-21 NOTE — Plan of Care (Signed)
  Problem: Consults Goal: RH STROKE PATIENT EDUCATION Description: See Patient Education module for education specifics  Outcome: Progressing   Problem: RH BOWEL ELIMINATION Goal: RH STG MANAGE BOWEL WITH ASSISTANCE Description: STG Manage Bowel with toileting Assistance. Outcome: Progressing Goal: RH STG MANAGE BOWEL W/MEDICATION W/ASSISTANCE Description: STG Manage Bowel with Medication with mod I  Assistance. Outcome: Progressing   Problem: RH BLADDER ELIMINATION Goal: RH STG MANAGE BLADDER WITH ASSISTANCE Description: STG Manage Bladder With toileting Assistance Outcome: Progressing Goal: RH STG MANAGE BLADDER WITH MEDICATION WITH ASSISTANCE Description: STG Manage Bladder With Medication With mod I Assistance. Outcome: Progressing   Problem: RH SAFETY Goal: RH STG ADHERE TO SAFETY PRECAUTIONS W/ASSISTANCE/DEVICE Description: STG Adhere to Safety Precautions With cues  Assistance/Device. Outcome: Progressing   Problem: RH PAIN MANAGEMENT Goal: RH STG PAIN MANAGED AT OR BELOW PT'S PAIN GOAL Description: < 4 with prns Outcome: Progressing   Problem: RH KNOWLEDGE DEFICIT Goal: RH STG INCREASE KNOWLEDGE OF HYPERTENSION Description: Patient and spouse will be able to manage HTN using educational resources for medications and dietary modification independently Outcome: Progressing Goal: RH STG INCREASE KNOWLEDGE OF STROKE PROPHYLAXIS Description: Patient and spouse will be able to manage secondary risks using educational resources for medications and dietary modification independently Outcome: Progressing

## 2023-01-21 NOTE — Progress Notes (Addendum)
 Patient ID: Kodey Xue, male   DOB: 25-Feb-1945, 78 y.o.   MRN: [id number]  SW followed up with pt wife as she reported to nursing she was cancelling family edu tomorrow due to potential inclement weather.   SW called pt wife, and she will follow-up with SW as she was on a phone call. SW informed medical team.  *family edu scheduled for Monday 1pm-4pm.   Graeme Jude, MSW, LCSW Office: [phone] Cell: [phone] Fax: [phone]

## 2023-01-21 NOTE — Progress Notes (Signed)
 PROGRESS NOTE   Subjective/Complaints:  ANkle Xray neg, pt still with discomfort mainly on lateral aspect of ankle Does not recall twisting ankle   ROS:  Denies CP, SOB, , N/V/D/C, or any other complaints at this time.    Objective:   DG Ankle 2 Views Right Result Date: 01/20/2023 CLINICAL DATA:  Right ankle pain EXAM: RIGHT ANKLE - 2 VIEW COMPARISON:  None Available. FINDINGS: Calcaneal spurring is noted. No acute fracture or dislocation is noted. No soft tissue changes are seen. IMPRESSION: No acute abnormality noted. Electronically Signed   By: Oneil Devonshire M.D.   On: 01/20/2023 19:09   No results for input(s): WBC, HGB, HCT, PLT in the last 72 hours.  No results for input(s): NA, K, CL, CO2, GLUCOSE, BUN, CREATININE, CALCIUM  in the last 72 hours.     Intake/Output Summary (Last 24 hours) at 01/21/2023 0714 Last data filed at 01/20/2023 1700 Gross per 24 hour  Intake 474 ml  Output --  Net 474 ml         Physical Exam: Vital Signs Blood pressure (!) 141/77, pulse 73, temperature 97.8 F (36.6 C), temperature source Oral, resp. rate 18, height 6' 1 (1.854 m), weight 102.2 kg, SpO2 95%.   General: No acute distress Mood and affect are appropriate Heart: Regular rate and rhythm no rubs murmurs or extra sounds Lungs: Clear to auscultation, breathing unlabored, no rales or wheezes Abdomen: Positive bowel sounds, soft nontender to palpation, nondistended Extremities:RLE swelling in the knee area, no thigh tenderness on R side  Skin: No evidence of breakdown, no evidence of rash, small spot 2cm of dried blood on post op dressing   PRIOR EXAMS: Neurological:     Mental Status: He is alert and oriented to person, place, and time.     Comments: Pt is alert, oriented to person, place, month/year. Needed some help with day. Language is non-fluent but with extra time he is able to communicate needs  with words and short sentences. Mild right hemiparesis RUE: 4+/5, RLE 3- HF, KE, 4/5 ankle DF (confounded by RTKA). LUE and LLE  5/5. No focal sensory loss.  . Normal resting tone. MSK- R knee flexion at 90 deg, lacks 30deg knee ext RIght ankle - + pitting edema but no tenderness to palpation except at lateral malleolus, no ecchymosis , pan with passive inversion but not with eversion or DF/PF   Assessment/Plan: 1. Functional deficits which require 3+ hours per day of interdisciplinary therapy in a comprehensive inpatient rehab setting. Physiatrist is providing close team supervision and 24 hour management of active medical problems listed below. Physiatrist and rehab team continue to assess barriers to discharge/monitor patient progress toward functional and medical goals  Care Tool:  Bathing    Body parts bathed by patient: Right arm, Chest, Abdomen, Front perineal area, Right upper leg, Left upper leg, Face, Left arm, Right lower leg, Left lower leg   Body parts bathed by helper: Buttocks, Right lower leg, Left lower leg     Bathing assist Assist Level: Minimal Assistance - Patient > 75%     Upper Body Dressing/Undressing Upper body dressing   What is the patient wearing?:  Pull over shirt    Upper body assist Assist Level: Minimal Assistance - Patient > 75%    Lower Body Dressing/Undressing Lower body dressing      What is the patient wearing?: Pants     Lower body assist Assist for lower body dressing: Minimal Assistance - Patient > 75%     Toileting Toileting Toileting Activity did not occur (Clothing management and hygiene only): N/A (no void or bm)  Toileting assist Assist for toileting: Contact Guard/Touching assist     Transfers Chair/bed transfer  Transfers assist     Chair/bed transfer assist level: Contact Guard/Touching assist     Locomotion Ambulation   Ambulation assist      Assist level: Minimal Assistance - Patient > 75% Assistive device:  Walker-rolling Max distance: 200 ft   Walk 10 feet activity   Assist     Assist level: Contact Guard/Touching assist Assistive device: Walker-rolling   Walk 50 feet activity   Assist    Assist level: Contact Guard/Touching assist Assistive device: Walker-rolling    Walk 150 feet activity   Assist    Assist level: Minimal Assistance - Patient > 75% Assistive device: Walker-rolling    Walk 10 feet on uneven surface  activity   Assist Walk 10 feet on uneven surfaces activity did not occur: Safety/medical concerns         Wheelchair     Assist Is the patient using a wheelchair?: Yes Type of Wheelchair: Manual    Wheelchair assist level: Dependent - Patient 0% Max wheelchair distance: 200 ft    Wheelchair 50 feet with 2 turns activity    Assist        Assist Level: Dependent - Patient 0%   Wheelchair 150 feet activity     Assist      Assist Level: Dependent - Patient 0%   Blood pressure (!) 141/77, pulse 73, temperature 97.8 F (36.6 C), temperature source Oral, resp. rate 18, height 6' 1 (1.854 m), weight 102.2 kg, SpO2 95%.  Medical Problem List and Plan: 1. 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             -patient may shower -ELOS/Goals: 01/27/23 days, goals supervision with PT, OT and min-sup with SLP   -Continue CIR  2.  Antithrombotics: -DVT/anticoagulation:  Mechanical: Sequential compression devices, below knee Right lower extremity in addition to below.  -LE dopplers 01/13/23 were clear. -antiplatelet therapy: Aspirin  and Brilinta  for 30 days followed by aspirin  alone. Last dose of Brilinta : 02/11/2023.   3. Pain Management: Tylenol , Robaxin , oxycodone  as needed  ankle pain mild instability due to CVA, Xray neg will order air splint  4. Mood/Behavior/Sleep: LCSW to evaluate and provide emotional  support             -continue Ambien  10 mg q HS             -continue Lexapro  20 daily             -antipsychotic agents: n/a   5. Neuropsych/cognition: This patient is capable of making decisions on his own behalf.   6. Skin/Wound Care: Routine skin care checks             -maintain Aquacel surgical dressing   7. Fluids/Electrolytes/Nutrition: Routine Is and Os and follow-up chemistries    Vitals:   01/17/23 1359 01/17/23 2018 01/18/23 0435 01/18/23 1422  BP:  107/69 132/70 (!) 140/69 115/71   01/18/23 1933 01/19/23 0501 01/19/23 1518 01/19/23 2001  BP: 125/71 132/74 134/78 123/74   01/20/23 0507 01/20/23 1318 01/20/23 2008 01/21/23 0542  BP: (!) 103/58 116/79 124/78 (!) 141/77     9: Hyperlipidemia: continue rosuvastatin  20mg  daily   10: BPH: continue Flomax  0.4mg  daily and Proscar  5mg  daily   11: Parkinson disease, possibly ET/PD:  -Sinemet  25/100, 2 tablets at 7 AM/2 tablets at 10 AM/2 at 1pm/2 at 4pm/2 at 7pm and 50/200 at bedtime -continue ropinirole  1 mg TID for rigidity -follows with Dr. Evonnie   12: Allergic rhinitis: continue Zaditor  gtts, Astelin  spray, Flonase , Claritin  10mg  daily   13: Asthma: continue Singulair  10mg  daily   14: History of CP, NSTEMI, CAD, RBBB and high burden of PVCs s/p Link monitor in past -continue mexiletine 150 mg BID -Plavix  discontinued and now on asa and Brilinta  -continue Crestor  20 mg daily -follows with Dr. Jeffrie   15: Mexiletine GERD/Barrett's esophagus: Protonix    16: Primary OA of right knee s/p TKA 01/11/2023 (previous left TKA)             -maintain zero degree knee bone foam 10-15 minutes TID             -range of motion in bed/with therapy  CPM Comp hose  17: Mild ABLA: follow-up CBC      Latest Ref Rng & Units 01/18/2023    5:27 AM 01/16/2023    6:12 AM 01/14/2023    7:04 AM  CBC  WBC 4.0 - 10.5 K/uL 7.9  8.2  8.3   Hemoglobin 13.0 - 17.0 g/dL 87.8  87.6  87.9   Hematocrit 39.0 - 52.0 % 36.8  37.6  36.2   Platelets  150 - 400 K/uL 275  252  197       18: Anti-constipation: continue colace 100 mg BID; prns ordered  -01/17/23 LBM , increase colace to 200mg     19: History of chronic intermittent hypotension:             -continue midodrine  5 mg BID for systolic BP < 100  Flomax  may be contributing will check PVR see if we can hold   LOS: 6 days A FACE TO FACE EVALUATION WAS PERFORMED  Joshua [name] 01/21/2023, 7:14 AM

## 2023-01-21 NOTE — Progress Notes (Signed)
 Physical Therapy Session Note  Patient Details  Name: Joshua [name] MRN: [id number] Date of Birth: 08-19-1945  Today's Date: 01/21/2023 PT Individual Time: 9044-8956; 1440 - 1523 PT Individual Time Calculation (min): 48 min; 43 min  Short Term Goals: Week 1:  PT Short Term Goal 1 (Week 1): STG = LTG d/t ELOS  SESSION 1 Skilled Therapeutic Interventions/Progress Updates: Patient sitting in recliner on entrance to room. Patient alert and agreeable to PT session.   Patient reported pain in R knee and ankle 7/10 (pt reported it was tolerable).   Therapeutic Activity: Transfers: Pt performed sit<>stand transfers throughout session with RW and with close supervision for safety. Provided VC for hand placement  - Pt pressing into BOSU ball to measure available AROM of R knee extension (165*)  Therapeutic Exercise: Pt performed the following exercises with therapist providing the described cuing and facilitation for improvement. - Standing R knee extension with yellow theraband pulling into knee flexion. Pt with B UE support on stair (theraband tied around pole). Pt with CGA for safety and with VC for positioning, increasing hip extension, and forward gaze vs downward. Pt performed until close to fatigue, then performed x 2 with 10s isometric holds in extension. Pt required seated rest break - Pt sitting in WC with BOSU dome against wall and verbal instructions to extend R LE into ball with 5 second isometric hold on R quads, leading to HS stretch for further ROM. Pt performed 2 rounds until close to fatigue - Pt ambulated from main gym back room at end of session using RW with supervision in order to increase ambulatory endurance. Pt with no reports of increase in pain in R knee/ankle  Patient sitting in recliner at end of session with brakes locked, chair alarm set, and all needs within reach.  SESSION 2 Skilled Therapeutic Interventions/Progress Updates: Patient sitting in recliner on  entrance to room. Patient alert and agreeable to PT session.   Patient reported slight increase in pain from previous session in R ankle and knee (7/10, but still tolerable). Pt R ankle seemingly with increase in swelling.   PTA obtain measurement around malleolus on R ankle - 10.5  Pt also with bruising on lateral lower leg with some tightness (no heat). Attending nsg notified.   Pt positioned in supine with cryo-machine wrapped around R knee for pain reduction.  Therapeutic Activity: Bed Mobility: Pt performed sit<>supine and supine to R and L sidelying at end of session with HOB slightly raised (pt reporting having bed features at home that allow this - pt did not use railing) with modI. No VC required.  Transfers: Pt performed sit<>stand transfers throughout session with RW and with supervision. VC for hand placement.  Therapeutic Exercise: Pt performed the following exercises with therapist providing the described cuing and facilitation for improvement. - NuStep on level 1 for 10 min (797 steps). Primary focus of this was to perform AROM on R LE without added resistance due to R ankle presenting with observable increase in swelling. Pt also with 100 steps per minute average, but seat was extended further back as to increase R LE extension with each rotation.   Patient supine in bed at end of session with brakes locked, bed alarm set, and all needs within reach.       Therapy Documentation Precautions:  Precautions Precautions: Knee, Fall Precaution Comments: R TKA (need to rest in extension), R hemi/inattention, Expressive aphasia, CPM to R knee at least once daily Restrictions Weight Bearing  Restrictions Per Provider Order: Yes RLE Weight Bearing Per Provider Order: Weight bearing as tolerated  Therapy/Group: Individual Therapy  Gennaro Lizotte PTA 01/21/2023, 12:12 PM

## 2023-01-21 NOTE — Progress Notes (Signed)
 Speech Language Pathology Daily Session Note  Patient Details  Name: Joshua [name] MRN: [id number] Date of Birth: 1946/01/06  Today's Date: 01/21/2023 SLP Individual Time: 1300-1400 SLP Individual Time Calculation (min): 60 min  Short Term Goals: Week 1: SLP Short Term Goal 1 (Week 1): Patient will utilize compensatory strategies to increase word finding of abstract thoughts during conversation given mod multimodal A SLP Short Term Goal 2 (Week 1): Patient will demonstrate problem solving abilities during basic-mildly complex functional daily situations given mod multimodal A SLP Short Term Goal 3 (Week 1): Patient will recall and utilize memory compensatory strategies given mod multimodal A SLP Short Term Goal 4 (Week 1): Patient will demonstrate attention to R during functional tasks given mod multimodal A SLP Short Term Goal 5 (Week 1): Patient will demonstrate awareness of errors during functional tasks given mod multimodal A  Skilled Therapeutic Interventions: Skilled therapy session focused on cognitive goals. Upon entrance, patient consuming lunch. SLP faciliated session by encouraging patient to recall memory/word finding strategies. Patient recalled 3/4 WRAP (write, repeat, associate, picture) memory strategies and 4/5 word finding strategies independently. To practice utilization of these strategies, SLP encouraged patient to read short stories aloud. SLP and patient utilized WRAP memory strategies to aid in recall, specifically repeat, associate, and picture. After a 10 minute delay, patient answered comprehension questions with 100% accuracy with supervisionA. At the end of the session, SLP aided patient in completion of memory book. Patient independently recalled date and activities completed in prior session for SLP to write. Patient left in bed with alarm set and call bell in reach. Continue POC.    Pain denies  Therapy/Group: Individual Therapy  Lauraann Missey M.A.,  CF-SLP 01/21/2023, 7:40 AM

## 2023-01-21 NOTE — Progress Notes (Signed)
 Orthopedic Tech Progress Note Patient Details:  Joshua [name] June 21, 1945 [id number]  Air cast splint adjusted and at bedside. Pt was seated in the recliner and preferred to not have it on at this time if he is not getting up/ambulating.  Ortho Devices Type of Ortho Device: Ankle Air splint Ortho Device/Splint Location: For RLE Ortho Device/Splint Interventions: Ordered, Adjustment   Post Interventions Patient Tolerated: Well Instructions Provided: Care of device, Adjustment of device  Joshua [name] 01/21/2023, 12:44 PM

## 2023-01-21 NOTE — Progress Notes (Signed)
 Occupational Therapy Session Note  Patient Details  Name: Joshua [name] MRN: [id number] Date of Birth: [birth year]  Today's Date: 01/21/2023 OT Individual Time: 9163-9066 OT Individual Time Calculation (min): 57 min    Short Term Goals: Week 1:  OT Short Term Goal 1 (Week 1): STGs=LTGs due to patient's estimated length of stay.  Skilled Therapeutic Interventions/Progress Updates:  Pt greeted supine in bed, pt agreeable to OT intervention.      Transfers/bed mobility/functional mobility: pt completed supine>sit with supervision. Stand pivot to w/c with RW and CGA.   Therapeutic activity: pt completed functional ambulation task with pt instructed to grasp playing cards with R hand and then transport card ~ 8 ft to matching game board with an emphasis on short distance mobility and RUE motor planning. Pt completed task with CGA, + time needed to manipulate cards.   Pt reports one step into dinning room at home, pt able to complete alternating stepping onto 3 inch step with BUE supprot from RW, graded task up and added in dynamic reaching with RUE to place horseshoes on rim of basketball hoop to simulate reaching for iADLs items. Pt completed task with CGA for balacne.    ADLs:  Grooming: pt completed seated oral care at sink with set- up assist. Pt reports once I get settled at home I am going to modify somethings in relation to ADLs at sink  UB dressing:pt donned OH shirt with set- up assist however did hand pt shirt oriented correctly  Footwear: donned slip in shoes with MIN A  Bathing: pt completed UB bathing tasks seated at sink with supervision, + time needed to manage bottles d/t tremors    Ended session with pt seated in recliner with all needs within reach and chair alarm activated.                    Therapy Documentation Precautions:  Precautions Precautions: Knee, Fall Precaution Comments: R TKA (need to rest in extension), R hemi/inattention, Expressive aphasia,  CPM to R knee at least once daily Restrictions Weight Bearing Restrictions Per Provider Order: Yes RLE Weight Bearing Per Provider Order: Weight bearing as tolerated  Pain: No pain    Therapy/Group: Individual Therapy  Ronal Gift [facility] 01/21/2023, 12:15 PM

## 2023-01-22 DIAGNOSIS — I63412 Cerebral infarction due to embolism of left middle cerebral artery: Secondary | ICD-10-CM | POA: Diagnosis not present

## 2023-01-22 DIAGNOSIS — N400 Enlarged prostate without lower urinary tract symptoms: Secondary | ICD-10-CM | POA: Diagnosis not present

## 2023-01-22 DIAGNOSIS — Z96651 Presence of right artificial knee joint: Secondary | ICD-10-CM | POA: Diagnosis not present

## 2023-01-22 DIAGNOSIS — G20B1 Parkinson's disease with dyskinesia, without mention of fluctuations: Secondary | ICD-10-CM | POA: Diagnosis not present

## 2023-01-22 NOTE — Progress Notes (Signed)
 PROGRESS NOTE   Subjective/Complaints: Appreciate ortho note  Per pt post op dressing to be removed on D/C date (~2wk post op) Needs oxy IR for pain control Denies abd pain , LBM 1/9 ROS:  Denies CP, SOB, , N/V/D/C, or any other complaints at this time.    Objective:   DG Ankle 2 Views Right Result Date: 01/20/2023 CLINICAL DATA:  Right ankle pain EXAM: RIGHT ANKLE - 2 VIEW COMPARISON:  None Available. FINDINGS: Calcaneal spurring is noted. No acute fracture or dislocation is noted. No soft tissue changes are seen. IMPRESSION: No acute abnormality noted. Electronically Signed   By: Oneil Devonshire M.D.   On: 01/20/2023 19:09   No results for input(s): WBC, HGB, HCT, PLT in the last 72 hours.  No results for input(s): NA, K, CL, CO2, GLUCOSE, BUN, CREATININE, CALCIUM  in the last 72 hours.     Intake/Output Summary (Last 24 hours) at 01/22/2023 0647 Last data filed at 01/22/2023 0600 Gross per 24 hour  Intake 832 ml  Output --  Net 832 ml         Physical Exam: Vital Signs Blood pressure (!) 158/82, pulse 74, temperature 97.9 F (36.6 C), temperature source Oral, resp. rate 18, height 6' 1 (1.854 m), weight 102.2 kg, SpO2 97%.   General: No acute distress Mood and affect are appropriate Heart: Regular rate and rhythm no rubs murmurs or extra sounds Lungs: Clear to auscultation, breathing unlabored, no rales or wheezes Abdomen: Positive bowel sounds, soft nontender to palpation, nondistended Extremities:RLE swelling in the knee area, no thigh tenderness on R side  Skin: No evidence of breakdown, no evidence of rash, small spot 2cm of dried blood on post op dressing   PRIOR EXAMS: Neurological:     Mental Status: He is alert and oriented to person, place, and time.     Comments: Pt is alert, oriented to person, place, month/year. Mild anomia Mild right hemiparesis RUE: 4+/5, RLE 3- HF, KE, 4/5  ankle DF (confounded by RTKA). LUE and LLE  5/5. No focal sensory loss.  . Normal resting tone. MSK- R knee flexion at 90 deg, lacks 30deg knee ext RIght ankle - + pitting edema but no tenderness to palpation , no joint swelling  Assessment/Plan: 1. Functional deficits which require 3+ hours per day of interdisciplinary therapy in a comprehensive inpatient rehab setting. Physiatrist is providing close team supervision and 24 hour management of active medical problems listed below. Physiatrist and rehab team continue to assess barriers to discharge/monitor patient progress toward functional and medical goals  Care Tool:  Bathing    Body parts bathed by patient: Right arm, Left arm, Chest, Abdomen   Body parts bathed by helper: Buttocks, Right lower leg, Left lower leg     Bathing assist Assist Level: Supervision/Verbal cueing     Upper Body Dressing/Undressing Upper body dressing   What is the patient wearing?: Pull over shirt    Upper body assist Assist Level: Set up assist    Lower Body Dressing/Undressing Lower body dressing      What is the patient wearing?: Pants     Lower body assist Assist for lower body dressing:  Minimal Assistance - Patient > 75%     Toileting Toileting Toileting Activity did not occur Press Photographer and hygiene only): N/A (no void or bm)  Toileting assist Assist for toileting: Contact Guard/Touching assist     Transfers Chair/bed transfer  Transfers assist     Chair/bed transfer assist level: Supervision/Verbal cueing     Locomotion Ambulation   Ambulation assist      Assist level: Supervision/Verbal cueing Assistive device: Walker-rolling Max distance: 200   Walk 10 feet activity   Assist     Assist level: Supervision/Verbal cueing Assistive device: Walker-rolling   Walk 50 feet activity   Assist    Assist level: Supervision/Verbal cueing Assistive device: Walker-rolling    Walk 150 feet  activity   Assist    Assist level: Supervision/Verbal cueing Assistive device: Walker-rolling    Walk 10 feet on uneven surface  activity   Assist Walk 10 feet on uneven surfaces activity did not occur: Safety/medical concerns         Wheelchair     Assist Is the patient using a wheelchair?: Yes Type of Wheelchair: Manual    Wheelchair assist level: Dependent - Patient 0% Max wheelchair distance: 200 ft    Wheelchair 50 feet with 2 turns activity    Assist        Assist Level: Dependent - Patient 0%   Wheelchair 150 feet activity     Assist      Assist Level: Dependent - Patient 0%   Blood pressure (!) 158/82, pulse 74, temperature 97.9 F (36.6 C), temperature source Oral, resp. rate 18, height 6' 1 (1.854 m), weight 102.2 kg, SpO2 97%.  Medical Problem List and Plan: 1. 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             -patient may shower -ELOS/Goals: 01/27/23 days, goals supervision with PT, OT and min-sup with SLP   -Continue CIR  2.  Antithrombotics: -DVT/anticoagulation:  Mechanical: Sequential compression devices, below knee Right lower extremity in addition to below.  -LE dopplers 01/13/23 were clear. -antiplatelet therapy: Aspirin  and Brilinta  for 30 days followed by aspirin  alone. Last dose of Brilinta : 02/11/2023.   3. Pain Management: Tylenol , Robaxin , oxycodone  as needed  ankle pain mild instability due to CVA, Xray neg will order air splint  4. Mood/Behavior/Sleep: LCSW to evaluate and provide emotional support             -continue Ambien  10 mg q HS             -continue Lexapro  20 daily             -antipsychotic agents: n/a   5. Neuropsych/cognition: This patient is capable of making decisions on his own behalf.   6. Skin/Wound Care: Routine skin care checks             -maintain Aquacel surgical  dressing   7. Fluids/Electrolytes/Nutrition: Routine Is and Os and follow-up chemistries    Vitals:   01/18/23 1933 01/19/23 0501 01/19/23 1518 01/19/23 2001  BP: 125/71 132/74 134/78 123/74   01/20/23 0507 01/20/23 1318 01/20/23 2008 01/21/23 0542  BP: (!) 103/58 116/79 124/78 (!) 141/77   01/21/23 1429 01/21/23 1941 01/21/23 2132 01/22/23 0341  BP: 103/73 127/75 (!) 151/78 (!) 158/82     9: Hyperlipidemia: continue rosuvastatin  20mg  daily   10: BPH: continue Flomax  0.4mg  daily and Proscar  5mg   daily   11: Parkinson disease, possibly ET/PD:  -Sinemet  25/100, 2 tablets at 7 AM/2 tablets at 10 AM/2 at 1pm/2 at 4pm/2 at 7pm and 50/200 at bedtime -continue ropinirole  1 mg TID for rigidity -follows with Dr. Evonnie   12: Allergic rhinitis: continue Zaditor  gtts, Astelin  spray, Flonase , Claritin  10mg  daily   13: Asthma: continue Singulair  10mg  daily   14: History of CP, NSTEMI, CAD, RBBB and high burden of PVCs s/p Link monitor in past -continue mexiletine 150 mg BID -Plavix  discontinued and now on asa and Brilinta  -continue Crestor  20 mg daily -follows with Dr. Jeffrie   15: Mexiletine GERD/Barrett's esophagus: Protonix    16: Primary OA of right knee s/p TKA 01/11/2023 (previous left TKA)             -maintain zero degree knee bone foam 10-15 minutes TID             -range of motion in bed/with therapy  CPM Comp hose  Appreciate ortho note , f/u next week wound check  17: Mild ABLA: follow-up CBC      Latest Ref Rng & Units 01/18/2023    5:27 AM 01/16/2023    6:12 AM 01/14/2023    7:04 AM  CBC  WBC 4.0 - 10.5 K/uL 7.9  8.2  8.3   Hemoglobin 13.0 - 17.0 g/dL 87.8  87.6  87.9   Hematocrit 39.0 - 52.0 % 36.8  37.6  36.2   Platelets 150 - 400 K/uL 275  252  197       18: Anti-constipation: continue colace 100 mg BID; prns ordered  -01/17/23 LBM , increase colace to 200mg     19: History of chronic intermittent hypotension:             -continue midodrine  5 mg BID for systolic BP <  100  Flomax  may be contributing will check PVR see if we can hold   LOS: 7 days A FACE TO FACE EVALUATION WAS PERFORMED  Joshua [name] 01/22/2023, 6:47 AM

## 2023-01-22 NOTE — Progress Notes (Signed)
 Occupational Therapy Weekly Progress Note  Patient Details  Name: Joshua [name] MRN: [id number] Date of Birth: 1945-12-18  Beginning of progress report period: January 16, 2023 End of progress report period: January 22, 2023  Today's Date: 01/22/2023 OT Individual Time: 8969-8884 OT Individual Time Calculation (min): 45 min    STGs were not set initially due to initial estimation of LOS.  Pt's LOS will be a few days longer with a planned dc date of 01/26/22.  Patient continues to demonstrate the following deficits: muscle joint tightness, unbalanced muscle activation and decreased coordination, and decreased standing balance, hemiplegia, and decreased balance strategies and therefore will continue to benefit from skilled OT intervention to enhance overall performance with BADL.  Patient progressing toward long term goals..  Continue plan of care.  OT Short Term Goals Week 1:  OT Short Term Goal 1 (Week 1): STGs=LTGs due to patient's estimated length of stay. OT Short Term Goal 1 - Progress (Week 1): Progressing toward goal Week 2:  OT Short Term Goal 1 (Week 2): STGs = LTGs  Skilled Therapeutic Interventions/Progress Updates:     Pt received in bed ready for therapy.  Pt reported that he showered this am with the nurse tech.    ADL Retraining: -practiced donning pants over feet from EOB with no assist needed to don and doff over feet -practiced donning socks with sock aide with set up A only. Pt is familiar with the AE and has it at home. -donned L shoe without A but needed min A with R shoe due to aircast   Transfers: -supervision to sit to EOB, light CGA with sit to stands, min A stand pivot transfers.  Cues for hand position with rise to stand and lowering to sit.  Cues for positioning of R foot to increase safety of sit to stands.    Neuromuscular Re-Education:  -practiced using R hand for [facility] tasks. He has limited sensation in R hand that has been going on for several  months.  He has severely limited wrist extension due to a prior injury.  He has had several neck surgeries that may also be impacting his sensation.  He does use a wrist support splint. Attempted to have pt try some radial nerve glide stretches but pt unable to self position arm with arm extended by side with wrist flexed and in supination.  He was able to work on forearm pronation and supination AROM.     Pt resting in recliner with all needs met.  call light in reach.     Therapy Documentation Precautions:  Precautions Precautions: Knee, Fall Precaution Comments: R TKA (need to rest in extension), R hemi/inattention, Expressive aphasia, CPM to R knee at least once daily Restrictions Weight Bearing Restrictions Per Provider Order: Yes RLE Weight Bearing Per Provider Order: Weight bearing as tolerated   Pain:  ongoing R knee pain, pt able to tolerate session well. Was premedicated   ADL: ADL Eating: Set up Where Assessed-Eating: Bed level Grooming: Setup Where Assessed-Grooming: Sitting at sink Upper Body Bathing: Supervision/safety Where Assessed-Upper Body Bathing: Shower Lower Body Bathing: Minimal assistance Where Assessed-Lower Body Bathing: Shower Upper Body Dressing: Supervision/safety Where Assessed-Upper Body Dressing: Chair Lower Body Dressing: Minimal assistance Where Assessed-Lower Body Dressing: Chair Toileting: Minimal assistance Where Assessed-Toileting: Teacher, Adult Education: Furniture Conservator/restorer Method: Proofreader: Bedside commode, Other (comment) (RW) Tub/Shower Transfer: Unable to assess Film/video Editor: Administrator, Arts Method: Designer, Industrial/product: Air Traffic Controller  seat with back, Grab bars    Therapy/Group: Individual Therapy  Tabia Landowski 01/22/2023, 8:29 AM

## 2023-01-22 NOTE — Progress Notes (Signed)
 Physical Therapy Session Note  Patient Details  Name: Joshua [name] MRN: [id number] Date of Birth: [birth year]  Today's Date: 01/22/2023 PT Individual Time: 0906-1004 PT Individual Time Calculation (min): 58 min   Short Term Goals: Week 1:  PT Short Term Goal 1 (Week 1): STG = LTG d/t ELOS  Skilled Therapeutic Interventions/Progress Updates: Patient ambulating in room with RW and with NT present on entrance to room. Patient alert and agreeable to PT session.   Patient reported 7/10 pain in R knee (pt stated it was tolerable). Pt also reported that R ankle is feeling better today than previous days  Therapeutic Activity: Bed Mobility: Pt performed sit<supine at end of session from EOB with modI Transfers: Pt performed sit<>stand transfers throughout session with RW and with supervision.  - Pt ambulated roughly 500' from room to [employer] in [place] with supervision. Pt required one rest break and then ambulated 540' from N tower to main gym with same level of assistance. Pt educated that this is roughly the distance of current ability to ambulate until needing to rest due to pain with pt understanding. Pt required VC to depress B shoulders to decreased WB on UE's, and to avoid tightness in upper trap/neck musculature. Pt also required VC to maintain close proximity to RW for increased upright posture.  - Pt navigated curb step with RW. Pt provided with demonstrative cues for sequence (step of with good - L - and down with bad - R) and safety of RW management. Pt with light CGA at first for safety, then progressed to supervision with no issue. - Pt navigated 4, 6 steps with B UE support on railing. Pt recalled previous cue of ascending with L, and descending with R. Pt with supervision throughout and only required VC to increase BOS due to narrow presentation.   - At end of session, pt cued to adjust bed linen to prepare for laying down, and to remove objects not desirable to lay down on.  Pt performed task with supervision and without B UE support and no LOB  Therapeutic Exercise: Pt performed the following exercises with therapist providing the described cuing and facilitation for improvement. - Pt with R LE extending into BOSU ball while sitting in WC in order to isometrically contract R quads (5s), and to stretch R HS. Pt performed 15 reps, followed by rest break with 2nd set to follow.  Patient supine in bed at end of session with brakes locked, bed alarm set, and all needs within reach.      Therapy Documentation Precautions:  Precautions Precautions: Knee, Fall Precaution Comments: R TKA (need to rest in extension), R hemi/inattention, Expressive aphasia, CPM to R knee at least once daily Restrictions Weight Bearing Restrictions Per Provider Order: Yes RLE Weight Bearing Per Provider Order: Weight bearing as tolerated   Therapy/Group: Individual Therapy  Ahmira Boisselle PTA  01/22/2023, 12:53 PM

## 2023-01-22 NOTE — Progress Notes (Signed)
 Speech Language Pathology Daily Session Note  Patient Details  Name: Joshua [name] MRN: [id number] Date of Birth: 18-Jul-1945  Today's Date: 01/22/2023 SLP Individual Time: 1445-1530 SLP Individual Time Calculation (min): 45 min  Short Term Goals: Week 2: SLP Short Term Goal 1 (Week 2): STG = LTG due to ELOS  Skilled Therapeutic Interventions: Skilled therapy session focused on cognitive goals. SLP faciliated session through prompting completion of memory book. Patient able to recall activities completed in PT/OT, however with some confusion to what activites were completed with which therapist. SLP continued to address cognitive goals through problem solving and attention task. Patient was prompted to identify medication mistakes in BID pillbox. Patient required supervision-min A to complete task and recall how to fix errors. At end of session, SLP initiated medication management task. Patient able to recall majority of medications he is currently taking and reasoning for each. This demonstrates improvement in functional memory. Patient left in bed with alarm set and call bell in reach. Continue POC.   Pain 7/10 pain in legs, nursing aware   Therapy/Group: Individual Therapy  Maxxon Schwanke M.A., CF-SLP 01/22/2023, 7:39 AM

## 2023-01-22 NOTE — Progress Notes (Signed)
 Occupational Therapy Session Note  Patient Details  Name: Marquie Aderhold MRN: [id number] Date of Birth: 1945-07-05  Today's Date: 01/22/2023 OT Individual Time: 1306-1407 OT Individual Time Calculation (min): 61 min    Short Term Goals: Week 1:  OT Short Term Goal 1 (Week 1): STGs=LTGs due to patient's estimated length of stay. OT Short Term Goal 1 - Progress (Week 1): Progressing toward goal  Skilled Therapeutic Interventions/Progress Updates:  Pt greeted supine in bed, pt agreeable to OT intervention.      Transfers/bed mobility/functional mobility: stand pivot transfer with RW and CGA, sit>stands with Rw and CGA.   Therapeutic activity:  Pt completed various RUE coordination tasks with an emphasis on [facility] and motor planning including using spatula to flip over bean bags and large plastic dots to simulate IADL task of cooking. Pts tremors noted to decrease when grasping object.  Pt also able to retrieve small geometric shapes using pincer grasp to transport shapes across midline with an emphasis on motor planning and proprioception. Pt completed task with + time but overall supervision.  Pt reports that the biggest change in his coordination is more related to speed vs accuracy as pt had tremors at baseline.   Pt completed various seated visual motor tasks using BITs to further assess reports of double vision, however pt completed various tasks with no indications of double vision. Pt was noted to have decreased accuracy when reaching with RUE and decreased speed when attempting to locate moving stimulus:  Visual motor exercise- 61%, 4 mins 14 secs, 9.77 sec reaction time  Visual scanning- 72% accuracy, 1 mins 46 secs, 4.07 sec reaction time   Assessments:   Box and Blocks Test measures unilateral gross manual dexterity. - Instructions The pt was instructed to carry one block over at a time and go as quickly as they could, making sure their fingertips crossed the partition.  One minute was given to complete the task per UE. The pt was allowed a 15-second trial period prior to testing if needed. - Results The pt transferred 13 blocks with the R hand and 40 with the L hand. The total number of blocks carried from one compartment to the other in one minute is scored per hand. Higher scores on the test indicate better gross manual dexterity.  - Norms for adults males 50-75+ -50-54 R 79 L 77.0 -55-59 R 75.2 L 73.8 -60-64 R 71.3 L 70.5 -65-69 R 68.5 L 67.4 -70-74 R 66.3 L 64.33 -75+ R 63.0 L 61.3 ADLs:   Footwear: donned shoes with MAX A d/t air cast in R shoe Toileting: supervision for 3/3 toileting tasks with continent urine void.  Transfers: pt completed ambulatory toilet transfer with RW and CGA.   Ended session with pt supine  in bed with all needs within reach and bed alarm activated.                    Therapy Documentation Precautions:  Precautions Precautions: Knee, Fall Precaution Comments: R TKA (need to rest in extension), R hemi/inattention, Expressive aphasia, CPM to R knee at least once daily Restrictions Weight Bearing Restrictions Per Provider Order: Yes RLE Weight Bearing Per Provider Order: Weight bearing as tolerated  Pain: unrated pain reported in RLE, rest breaks provided as needed.     Therapy/Group: Individual Therapy  Ronal Mallie Needy 01/22/2023, 3:19 PM

## 2023-01-22 NOTE — Progress Notes (Signed)
     Subjective: Patient reports knee as sore. Also having some pain around the right ankle. Doing well overall. Using CPM machine;[name] Making good progress with therapy. Speech is much improved this week. Patient reports plan for DC home next week.   Objective:   VITALS:   Vitals:   01/21/23 1429 01/21/23 1941 01/21/23 2132 01/22/23 0341  BP: 103/73 127/75 (!) 151/78 (!) 158/82  Pulse: 72 74 76 74  Resp: 20 17  18   Temp: (!) 97.1 F (36.2 C) 98 F (36.7 C)  97.9 F (36.6 C)  TempSrc:  Oral  Oral  SpO2: 99% 100% 96% 97%  Weight:      Height:        Sensation intact distally Intact pulses distally Dorsiflexion/Plantar flexion intact Incision: dressing C/D/I Compartment soft    Lab Results  Component Value Date   WBC 7.9 01/18/2023   HGB 12.1 (L) 01/18/2023   HCT 36.8 (L) 01/18/2023   MCV 92.7 01/18/2023   PLT 275 01/18/2023   BMET    Component Value Date/Time   NA 135 01/18/2023 0527   NA 136 02/14/2021 1528   K 4.6 01/18/2023 0527   CL 103 01/18/2023 0527   CO2 23 01/18/2023 0527   GLUCOSE 102 (H) 01/18/2023 0527   BUN 20 01/18/2023 0527   BUN 20 02/14/2021 1528   CREATININE 0.97 01/18/2023 0527   CREATININE 0.88 09/18/2015 1135   CALCIUM  8.9 01/18/2023 0527   EGFR 73 02/14/2021 1528   GFRNONAA >60 01/18/2023 0527    Xray: Total knee arthroplasty components in good position no adverse features  Assessment/Plan:     Principal Problem:   CVA (cerebral vascular accident) (HCC)  S/p R TKA 12/30 post op course complicated by stroke  Neurology following, recs appreciated  Post op recs: WB: WBAT Abx: ancef  Imaging: PACU xrays DVT prophylaxis: Aspirin  and Brilinta  per neurology team Follow up: 2 weeks after surgery for a wound check with Dr. Edna at [facility].  Address: [address], [place], [place] [address]  Office Phone: [phone]   TORIBIO DELENA EDNA 01/22/2023, 6:29 AM   Toribio Edna, MD  Contact  information:   [phone] 7am-5pm epic message Dr. Edna, or call office for patient follow up: [phone] After hours and holidays please check [email or web address] for group call information for [facility]

## 2023-01-23 DIAGNOSIS — I63412 Cerebral infarction due to embolism of left middle cerebral artery: Secondary | ICD-10-CM | POA: Diagnosis not present

## 2023-01-23 DIAGNOSIS — K5901 Slow transit constipation: Secondary | ICD-10-CM | POA: Diagnosis not present

## 2023-01-23 DIAGNOSIS — I10 Essential (primary) hypertension: Secondary | ICD-10-CM | POA: Diagnosis not present

## 2023-01-23 NOTE — Progress Notes (Signed)
 Speech Language Pathology Daily Session Note  Patient Details  Name: Joshua [name] MRN: [id number] Date of Birth: August 23, 1945  Today's Date: 01/23/2023 SLP Individual Time: 1000-1100 SLP Individual Time Calculation (min): 60 min  Short Term Goals: Week 2: SLP Short Term Goal 1 (Week 2): STG = LTG due to ELOS  Skilled Therapeutic Interventions:  Pt seen for ST targeting speech and cognition goals. Pt in bed upon arrival, reported 7/10 pain in legs. SLP informed nursing who provided medications during the session. Pt requested asst to use the restroom and was continent of urine. Pt followed directions with 100% acc during transfer independently and displayed appropriate safety and judgement. SLP facilitated picture description and structured conversational speech tasks targeting word finding. Pt required min-mod verbal cues (VC) to use circumlocution during tasks, often attempting to use WRAP strategies instead. SLP then facilitated memory recall activities and pt recalled previous day's therapy with 70% acc independently, increased to 100% acc when using memory book and min VC. Pt requested transfer back to bed at end of session and SLP provided asst. Pt left in bed with call bell in reach and alarm active. Continue ST POC.  Pain Pain Assessment Pain Scale: 0-10 Pain Score: 7  Faces Pain Scale: No hurt Pain Location: Leg  Therapy/Group: Individual Therapy  Joshua [name] 01/23/2023, 5:57 PM

## 2023-01-23 NOTE — Progress Notes (Signed)
 PROGRESS NOTE   Subjective/Complaints:  Pt doing well, slept well, pain manageable though yesterday he had some increased pain from doing more work, LBM today, urinating fine, denies any other complaints or concerns today.   ROS:  Denies CP, SOB, , N/V/D/C, or any other complaints at this time.    Objective:   No results found.  No results for input(s): WBC, HGB, HCT, PLT in the last 72 hours.  No results for input(s): NA, K, CL, CO2, GLUCOSE, BUN, CREATININE, CALCIUM  in the last 72 hours.     Intake/Output Summary (Last 24 hours) at 01/23/2023 1051 Last data filed at 01/23/2023 0825 Gross per 24 hour  Intake 1260 ml  Output --  Net 1260 ml         Physical Exam: Vital Signs Blood pressure 115/68, pulse 67, temperature 98 F (36.7 C), temperature source Oral, resp. rate 17, height 6' 1 (1.854 m), weight 102.2 kg, SpO2 95%.   General: No acute distress, laying in bed.  Mood and affect are appropriate Heart: Regular rate and rhythm no rubs murmurs or extra sounds Lungs: Clear to auscultation, breathing unlabored, no rales or wheezes Abdomen: Positive bowel sounds, soft nontender to palpation, nondistended Extremities:RLE swelling in the knee area, no thigh tenderness on R side, trace edema at the pretibial area  Skin: No evidence of breakdown, no evidence of rash, small spot 2cm of dried blood on post op dressing   PRIOR EXAMS: Neurological:     Mental Status: He is alert and oriented to person, place, and time.     Comments: Pt is alert, oriented to person, place, month/year. Mild anomia Mild right hemiparesis RUE: 4+/5, RLE 3- HF, KE, 4/5 ankle DF (confounded by RTKA). LUE and LLE  5/5. No focal sensory loss.  . Normal resting tone. MSK- R knee flexion at 90 deg, lacks 30deg knee ext RIght ankle - + pitting edema but no tenderness to palpation , no joint swelling     Assessment/Plan: 1. Functional deficits which require 3+ hours per day of interdisciplinary therapy in a comprehensive inpatient rehab setting. Physiatrist is providing close team supervision and 24 hour management of active medical problems listed below. Physiatrist and rehab team continue to assess barriers to discharge/monitor patient progress toward functional and medical goals  Care Tool:  Bathing    Body parts bathed by patient: Right arm, Left arm, Chest, Abdomen   Body parts bathed by helper: Buttocks, Right lower leg, Left lower leg     Bathing assist Assist Level: Supervision/Verbal cueing     Upper Body Dressing/Undressing Upper body dressing   What is the patient wearing?: Pull over shirt    Upper body assist Assist Level: Set up assist    Lower Body Dressing/Undressing Lower body dressing      What is the patient wearing?: Pants     Lower body assist Assist for lower body dressing: Minimal Assistance - Patient > 75%     Toileting Toileting Toileting Activity did not occur (Clothing management and hygiene only): N/A (no void or bm)  Toileting assist Assist for toileting: Contact Guard/Touching assist     Transfers Chair/bed transfer  Transfers assist  Chair/bed transfer assist level: Supervision/Verbal cueing     Locomotion Ambulation   Ambulation assist      Assist level: Supervision/Verbal cueing Assistive device: Walker-rolling Max distance: 200   Walk 10 feet activity   Assist     Assist level: Supervision/Verbal cueing Assistive device: Walker-rolling   Walk 50 feet activity   Assist    Assist level: Supervision/Verbal cueing Assistive device: Walker-rolling    Walk 150 feet activity   Assist    Assist level: Supervision/Verbal cueing Assistive device: Walker-rolling    Walk 10 feet on uneven surface  activity   Assist Walk 10 feet on uneven surfaces activity did not occur: Safety/medical  concerns         Wheelchair     Assist Is the patient using a wheelchair?: Yes Type of Wheelchair: Manual    Wheelchair assist level: Dependent - Patient 0% Max wheelchair distance: 200 ft    Wheelchair 50 feet with 2 turns activity    Assist        Assist Level: Dependent - Patient 0%   Wheelchair 150 feet activity     Assist      Assist Level: Dependent - Patient 0%   Blood pressure 115/68, pulse 67, temperature 98 F (36.7 C), temperature source Oral, resp. rate 17, height 6' 1 (1.854 m), weight 102.2 kg, SpO2 95%.  Medical Problem List and Plan: 1. 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             -patient may shower -ELOS/Goals: 01/27/23 days, goals supervision with PT, OT and min-sup with SLP   -Continue CIR  2.  Antithrombotics: -DVT/anticoagulation:  Mechanical: Sequential compression devices, below knee Right lower extremity in addition to below.  -LE dopplers 01/13/23 were clear. -antiplatelet therapy: Aspirin  and Brilinta  for 30 days followed by aspirin  alone. Last dose of Brilinta : 02/11/2023.   3. Pain Management: Tylenol , Robaxin , oxycodone  as needed   -ankle pain mild instability due to CVA, Xray neg will order air splint   4. Mood/Behavior/Sleep: LCSW to evaluate and provide emotional support             -continue Ambien  10 mg q HS             -continue Lexapro  20 daily             -antipsychotic agents: n/a   5. Neuropsych/cognition: This patient is capable of making decisions on his own behalf.   6. Skin/Wound Care: Routine skin care checks             -maintain Aquacel surgical dressing   7. Fluids/Electrolytes/Nutrition: Routine Is and Os and follow-up chemistries   8: Hypertension: monitor TID and prn (see BPH meds below); also intermittent Hypotension, see #19             -continue Toprol  12.5 mg q  HS             -1/4-5/25 BPs great, cont regimen  -01/23/23 BPs doing well, monitor Vitals:   01/19/23 2001 01/20/23 0507 01/20/23 1318 01/20/23 2008  BP: 123/74 (!) 103/58 116/79 124/78   01/21/23 0542 01/21/23 1429 01/21/23 1941 01/21/23 2132  BP: (!) 141/77 103/73 127/75 (!) 151/78   01/22/23 0341 01/22/23 1445 01/22/23 1939 01/23/23 0522  BP: (!) 158/82 121/68 135/74 115/68     9: Hyperlipidemia: continue rosuvastatin  20mg  daily   10: BPH: continue  Flomax  0.4mg  daily and Proscar  5mg  daily   11: Parkinson disease, possibly ET/PD:  -Sinemet  25/100, 2 tablets at 7 AM/2 tablets at 10 AM/2 at 1pm/2 at 4pm/2 at 7pm and 50/200 at bedtime -continue ropinirole  1 mg TID for rigidity -follows with Dr. Evonnie   12: Allergic rhinitis: continue Zaditor  gtts, Astelin  spray, Flonase , Claritin  10mg  daily   13: Asthma: continue Singulair  10mg  daily   14: History of CP, NSTEMI, CAD, RBBB and high burden of PVCs s/p Link monitor in past -continue mexiletine 150 mg BID -Plavix  discontinued and now on asa and Brilinta  -continue Crestor  20 mg daily -follows with Dr. Jeffrie   15: Mexiletine GERD/Barrett's esophagus: Protonix  40mg  daily   16: Primary OA of right knee s/p TKA 01/11/2023 (previous left TKA)             -maintain zero degree knee bone foam 10-15 minutes TID             -range of motion in bed/with therapy CPM Comp hose  Appreciate ortho note , f/u next week wound check  17: Mild ABLA: follow-up CBC      Latest Ref Rng & Units 01/18/2023    5:27 AM 01/16/2023    6:12 AM 01/14/2023    7:04 AM  CBC  WBC 4.0 - 10.5 K/uL 7.9  8.2  8.3   Hemoglobin 13.0 - 17.0 g/dL 87.8  87.6  87.9   Hematocrit 39.0 - 52.0 % 36.8  37.6  36.2   Platelets 150 - 400 K/uL 275  252  197       18: Anti-constipation: continue colace 100 mg BID; prns ordered  -01/17/23 LBM , increase colace to 200mg    -01/23/23 LBM this morning, cont regimen   19: History of chronic intermittent hypotension:              -continue midodrine  5 mg BID for systolic BP < 100   Flomax  may be contributing will check PVR see if we can hold   -01/23/23 no PVRs documented overnight but BPs fine, monitor for now  LOS: 8 days A FACE TO FACE EVALUATION WAS PERFORMED  [address] 01/23/2023, 10:51 AM

## 2023-01-23 NOTE — Plan of Care (Signed)
  Problem: Consults Goal: RH STROKE PATIENT EDUCATION Description: See Patient Education module for education specifics  Outcome: Progressing   Problem: RH BOWEL ELIMINATION Goal: RH STG MANAGE BOWEL WITH ASSISTANCE Description: STG Manage Bowel with toileting Assistance. Outcome: Progressing Goal: RH STG MANAGE BOWEL W/MEDICATION W/ASSISTANCE Description: STG Manage Bowel with Medication with mod I  Assistance. Outcome: Progressing   Problem: RH BLADDER ELIMINATION Goal: RH STG MANAGE BLADDER WITH ASSISTANCE Description: STG Manage Bladder With toileting Assistance Outcome: Progressing Goal: RH STG MANAGE BLADDER WITH MEDICATION WITH ASSISTANCE Description: STG Manage Bladder With Medication With mod I Assistance. Outcome: Progressing   Problem: RH SAFETY Goal: RH STG ADHERE TO SAFETY PRECAUTIONS W/ASSISTANCE/DEVICE Description: STG Adhere to Safety Precautions With cues  Assistance/Device. Outcome: Progressing   Problem: RH PAIN MANAGEMENT Goal: RH STG PAIN MANAGED AT OR BELOW PT'S PAIN GOAL Description: < 4 with prns Outcome: Progressing   Problem: RH KNOWLEDGE DEFICIT Goal: RH STG INCREASE KNOWLEDGE OF HYPERTENSION Description: Patient and spouse will be able to manage HTN using educational resources for medications and dietary modification independently Outcome: Progressing Goal: RH STG INCREASE KNOWLEDGE OF STROKE PROPHYLAXIS Description: Patient and spouse will be able to manage secondary risks using educational resources for medications and dietary modification independently Outcome: Progressing

## 2023-01-23 NOTE — Progress Notes (Signed)
 Occupational Therapy Session Note  Patient Details  Name: Joshua [name] MRN: [id number] Date of Birth: [birth year]  Today's Date: 01/23/2023 OT Individual Time: 1415-1500 OT Individual Time Calculation (min): 45 min    Short Term Goals: Week 2:  OT Short Term Goal 1 (Week 2): STGs = LTGs  Skilled Therapeutic Interventions/Progress Updates:    Pt received in bed. He had just gotten back to bed with nursing staff but was will to participate.  Pt stated he felt he has been making good progress to prepare for home but has felt the need to practice more ambulation.  Recommended he work on ambulation in this session with intervals of exercises for his RUE. Help pt to don R aircast.    Pt sat to EOB with supervision, sit to stands with S and ambulation in hallways with supervision only.  Pt completed a significant amount of ambulation today with supervision only using the RW. Pt reported no LE pain and his R ankle was feeling better. Recommended he try ambulation without air cast tomorrow to see how he tolerates.  Pt practiced sit to stands from edge of mat and from sports coach.  Occasional cues to reach back prior to sitting.  For the interval arm exercises, pt worked on B arm reaching using a weighted dowel bar, coordination with basketball bounces (seated position).  Pt stated he is feeling ready to go home by Wednesday.  Ambulated back to room and returned to bed to rest.  Alarm on and all needs met.   Therapy Documentation Precautions:  Precautions Precautions: Knee, Fall Precaution Comments: R TKA (need to rest in extension), R hemi/inattention, Expressive aphasia, CPM to R knee at least once daily Restrictions Weight Bearing Restrictions Per Provider Order: Yes RLE Weight Bearing Per Provider Order: Weight bearing as tolerated    Vital Signs: Therapy Vitals Temp: 97.8 F (36.6 C) Pulse Rate: 67 Resp: 14 Patient Position (if appropriate): Sitting Oxygen  Therapy SpO2:  96 % O2 Device: Room Air Pain: Pain Assessment Pain Score: 5  ADL: ADL Eating: Set up Where Assessed-Eating: Bed level Grooming: Setup Where Assessed-Grooming: Sitting at sink Upper Body Bathing: Supervision/safety Where Assessed-Upper Body Bathing: Shower Lower Body Bathing: Minimal assistance Where Assessed-Lower Body Bathing: Shower Upper Body Dressing: Supervision/safety Where Assessed-Upper Body Dressing: Chair Lower Body Dressing: Minimal assistance Where Assessed-Lower Body Dressing: Chair Toileting: Minimal assistance Where Assessed-Toileting: Teacher, Adult Education: Furniture Conservator/restorer Method: Proofreader: Bedside commode, Other (comment) (RW) Tub/Shower Transfer: Unable to assess Film/video Editor: Administrator, Arts Method: Designer, Industrial/product: Information systems manager with back, Grab bars   Therapy/Group: Individual Therapy  Nathan Moctezuma 01/23/2023, 2:52 PM

## 2023-01-24 DIAGNOSIS — K5901 Slow transit constipation: Secondary | ICD-10-CM | POA: Diagnosis not present

## 2023-01-24 DIAGNOSIS — I63412 Cerebral infarction due to embolism of left middle cerebral artery: Secondary | ICD-10-CM | POA: Diagnosis not present

## 2023-01-24 DIAGNOSIS — I10 Essential (primary) hypertension: Secondary | ICD-10-CM | POA: Diagnosis not present

## 2023-01-24 NOTE — Progress Notes (Signed)
 Patient requested his CPM machine around 2 pm today since he did not have therapy. Patient tolerated.

## 2023-01-24 NOTE — Progress Notes (Signed)
 PROGRESS NOTE   Subjective/Complaints:  Pt doing well again, slept well, pain manageable and better than the last couple days, LBM this morning, urinating fine, denies any other complaints or concerns today.   ROS:  Denies CP, SOB, abd pain, N/V/D/C, or any other complaints at this time.    Objective:   No results found.  No results for input(s): WBC, HGB, HCT, PLT in the last 72 hours.  No results for input(s): NA, K, CL, CO2, GLUCOSE, BUN, CREATININE, CALCIUM  in the last 72 hours.     Intake/Output Summary (Last 24 hours) at 01/24/2023 1022 Last data filed at 01/24/2023 0725 Gross per 24 hour  Intake 595 ml  Output 300 ml  Net 295 ml         Physical Exam: Vital Signs Blood pressure 109/71, pulse 65, temperature 97.8 F (36.6 C), resp. rate 17, height 6' 1 (1.854 m), weight 102.2 kg, SpO2 98%.   General: No acute distress, laying in bed.  Mood and affect are appropriate Heart: Regular rate and rhythm no rubs murmurs or extra sounds Lungs: Clear to auscultation, breathing unlabored, no rales or wheezes Abdomen: Positive bowel sounds, soft nontender to palpation, nondistended Extremities: RLE swelling in the knee area, no thigh tenderness on R side, trace edema at the pretibial area-stable Skin: No evidence of breakdown, no evidence of rash, small spot 2cm of dried blood on post op dressing   PRIOR EXAMS: Neurological:     Mental Status: He is alert and oriented to person, place, and time.     Comments: Pt is alert, oriented to person, place, month/year. Mild anomia Mild right hemiparesis RUE: 4+/5, RLE 3- HF, KE, 4/5 ankle DF (confounded by RTKA). LUE and LLE  5/5. No focal sensory loss.  . Normal resting tone. MSK- R knee flexion at 90 deg, lacks 30deg knee ext RIght ankle - + pitting edema but no tenderness to palpation , no joint swelling    Assessment/Plan: 1. Functional deficits  which require 3+ hours per day of interdisciplinary therapy in a comprehensive inpatient rehab setting. Physiatrist is providing close team supervision and 24 hour management of active medical problems listed below. Physiatrist and rehab team continue to assess barriers to discharge/monitor patient progress toward functional and medical goals  Care Tool:  Bathing    Body parts bathed by patient: Right arm, Left arm, Chest, Abdomen   Body parts bathed by helper: Buttocks, Right lower leg, Left lower leg     Bathing assist Assist Level: Supervision/Verbal cueing     Upper Body Dressing/Undressing Upper body dressing   What is the patient wearing?: Pull over shirt    Upper body assist Assist Level: Set up assist    Lower Body Dressing/Undressing Lower body dressing      What is the patient wearing?: Pants     Lower body assist Assist for lower body dressing: Minimal Assistance - Patient > 75%     Toileting Toileting Toileting Activity did not occur (Clothing management and hygiene only): N/A (no void or bm)  Toileting assist Assist for toileting: Contact Guard/Touching assist     Transfers Chair/bed transfer  Transfers assist  Chair/bed transfer assist level: Supervision/Verbal cueing     Locomotion Ambulation   Ambulation assist      Assist level: Supervision/Verbal cueing Assistive device: Walker-rolling Max distance: 200   Walk 10 feet activity   Assist     Assist level: Supervision/Verbal cueing Assistive device: Walker-rolling   Walk 50 feet activity   Assist    Assist level: Supervision/Verbal cueing Assistive device: Walker-rolling    Walk 150 feet activity   Assist    Assist level: Supervision/Verbal cueing Assistive device: Walker-rolling    Walk 10 feet on uneven surface  activity   Assist Walk 10 feet on uneven surfaces activity did not occur: Safety/medical concerns         Wheelchair     Assist Is the  patient using a wheelchair?: Yes Type of Wheelchair: Manual    Wheelchair assist level: Dependent - Patient 0% Max wheelchair distance: 200 ft    Wheelchair 50 feet with 2 turns activity    Assist        Assist Level: Dependent - Patient 0%   Wheelchair 150 feet activity     Assist      Assist Level: Dependent - Patient 0%   Blood pressure 109/71, pulse 65, temperature 97.8 F (36.6 C), resp. rate 17, height 6' 1 (1.854 m), weight 102.2 kg, SpO2 98%.  Medical Problem List and Plan: 1. 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             -patient may shower -ELOS/Goals: 01/27/23 days, goals supervision with PT, OT and min-sup with SLP   -Continue CIR  2.  Antithrombotics: -DVT/anticoagulation:  Mechanical: Sequential compression devices, below knee Right lower extremity in addition to below.  -LE dopplers 01/13/23 were clear. -antiplatelet therapy: Aspirin  and Brilinta  for 30 days followed by aspirin  alone. Last dose of Brilinta : 02/11/2023.   3. Pain Management: Tylenol , Robaxin , oxycodone  as needed   -ankle pain mild instability due to CVA, Xray neg will order air splint   4. Mood/Behavior/Sleep: LCSW to evaluate and provide emotional support             -continue Ambien  10 mg q HS             -continue Lexapro  20 daily             -antipsychotic agents: n/a   5. Neuropsych/cognition: This patient is capable of making decisions on his own behalf.   6. Skin/Wound Care: Routine skin care checks             -maintain Aquacel surgical dressing   7. Fluids/Electrolytes/Nutrition: Routine Is and Os and follow-up chemistries   8: Hypertension: monitor TID and prn (see BPH meds below); also intermittent Hypotension, see #19             -continue Toprol  12.5 mg q HS             -1/4-5/25 BPs great, cont regimen  -1/11-12/25 BPs doing well,  monitor Vitals:   01/20/23 2008 01/21/23 0542 01/21/23 1429 01/21/23 1941  BP: 124/78 (!) 141/77 103/73 127/75   01/21/23 2132 01/22/23 0341 01/22/23 1445 01/22/23 1939  BP: (!) 151/78 (!) 158/82 121/68 135/74   01/23/23 0522 01/23/23 1500 01/23/23 1932 01/24/23 0324  BP: 115/68 118/75 121/72 109/71     9: Hyperlipidemia: continue rosuvastatin  20mg  daily   10: BPH: continue Flomax  0.4mg  daily and  Proscar  5mg  daily   11: Parkinson disease, possibly ET/PD:  -Sinemet  25/100, 2 tablets at 7 AM/2 tablets at 10 AM/2 at 1pm/2 at 4pm/2 at 7pm and 50/200 at bedtime -continue ropinirole  1 mg TID for rigidity -follows with Dr. Evonnie   12: Allergic rhinitis: continue Zaditor  gtts, Astelin  spray, Flonase , Claritin  10mg  daily   13: Asthma: continue Singulair  10mg  daily   14: History of CP, NSTEMI, CAD, RBBB and high burden of PVCs s/p Link monitor in past -continue mexiletine 150 mg BID -Plavix  discontinued and now on asa and Brilinta  -continue Crestor  20 mg daily -follows with Dr. Jeffrie   15: Mexiletine GERD/Barrett's esophagus: Protonix  40mg  daily   16: Primary OA of right knee s/p TKA 01/11/2023 (previous left TKA)             -maintain zero degree knee bone foam 10-15 minutes TID             -range of motion in bed/with therapy CPM Comp hose  Appreciate ortho note , f/u next week wound check  17: Mild ABLA: follow-up CBC      Latest Ref Rng & Units 01/18/2023    5:27 AM 01/16/2023    6:12 AM 01/14/2023    7:04 AM  CBC  WBC 4.0 - 10.5 K/uL 7.9  8.2  8.3   Hemoglobin 13.0 - 17.0 g/dL 87.8  87.6  87.9   Hematocrit 39.0 - 52.0 % 36.8  37.6  36.2   Platelets 150 - 400 K/uL 275  252  197       18: Anti-constipation: continue colace 100 mg BID; prns ordered  -01/17/23 LBM , increase colace to 200mg    -01/24/23 LBM this morning, cont regimen   19: History of chronic intermittent hypotension:             -continue midodrine  5 mg BID for systolic BP < 100   Flomax  may be contributing will  check PVR see if we can hold   -01/23/23 no PVRs documented overnight but BPs fine, monitor for now  LOS: 9 days A FACE TO FACE EVALUATION WAS PERFORMED  [address] 01/24/2023, 10:22 AM

## 2023-01-25 ENCOUNTER — Inpatient Hospital Stay (HOSPITAL_COMMUNITY): Payer: Medicare HMO

## 2023-01-25 DIAGNOSIS — N400 Enlarged prostate without lower urinary tract symptoms: Secondary | ICD-10-CM | POA: Diagnosis not present

## 2023-01-25 DIAGNOSIS — Z96651 Presence of right artificial knee joint: Secondary | ICD-10-CM | POA: Diagnosis not present

## 2023-01-25 DIAGNOSIS — G20B1 Parkinson's disease with dyskinesia, without mention of fluctuations: Secondary | ICD-10-CM | POA: Diagnosis not present

## 2023-01-25 DIAGNOSIS — I63412 Cerebral infarction due to embolism of left middle cerebral artery: Secondary | ICD-10-CM | POA: Diagnosis not present

## 2023-01-25 LAB — BASIC METABOLIC PANEL
Anion gap: 8 (ref 5–15)
BUN: 16 mg/dL (ref 8–23)
CO2: 23 mmol/L (ref 22–32)
Calcium: 9 mg/dL (ref 8.9–10.3)
Chloride: 105 mmol/L (ref 98–111)
Creatinine, Ser: 0.93 mg/dL (ref 0.61–1.24)
GFR, Estimated: >60 mL/min (ref >60)
Glucose, Bld: 106 mg/dL — ABNORMAL HIGH (ref 70–99)
Potassium: 4.2 mmol/L (ref 3.5–5.1)
Sodium: 136 mmol/L (ref 135–145)

## 2023-01-25 LAB — CBC
HCT: 35.2 % — ABNORMAL LOW (ref 39.0–52.0)
Hemoglobin: 11.6 g/dL — ABNORMAL LOW (ref 13.0–17.0)
MCH: 30.4 pg (ref 26.0–34.0)
MCHC: 33 g/dL (ref 30.0–36.0)
MCV: 92.4 fL (ref 80.0–100.0)
Platelets: 352 10*3/uL (ref 150–400)
RBC: 3.81 MIL/uL — ABNORMAL LOW (ref 4.22–5.81)
RDW: 14.2 % (ref 11.5–15.5)
WBC: 8 10*3/uL (ref 4.0–10.5)
nRBC: 0 % (ref 0.0–0.2)

## 2023-01-25 NOTE — Progress Notes (Signed)
 PROGRESS NOTE   Subjective/Complaints:  Pt states he no longer has ankle pain on right side.  ASking about staple removal RIght knee Per Ortho note it looks like plan is for in office removal  but pt states he was told that it would be prior to discharge  ROS:  Denies CP, SOB, abd pain, N/V/D/C, or any other complaints at this time.    Objective:   No results found.  Recent Labs    01/25/23 0455  WBC 8.0  HGB 11.6*  HCT 35.2*  PLT 352    Recent Labs    01/25/23 0455  NA 136  K 4.2  CL 105  CO2 23  GLUCOSE 106*  BUN 16  CREATININE 0.93  CALCIUM  9.0       Intake/Output Summary (Last 24 hours) at 01/25/2023 0813 Last data filed at 01/25/2023 0737 Gross per 24 hour  Intake 537 ml  Output --  Net 537 ml         Physical Exam: Vital Signs Blood pressure 121/85, pulse 69, temperature 97.8 F (36.6 C), resp. rate 17, height 6' 1 (1.854 m), weight 102.2 kg, SpO2 94%.   General: No acute distress, laying in bed.  Mood and affect are appropriate Heart: Regular rate and rhythm no rubs murmurs or extra sounds Lungs: Clear to auscultation, breathing unlabored, no rales or wheezes Abdomen: Positive bowel sounds, soft nontender to palpation, nondistended Extremities: RLE swelling in the knee area, no thigh tenderness on R side, trace edema at the pretibial area-stable Skin: No evidence of breakdown, no evidence of rash, small spot 2cm of dried blood on post op dressing   PRIOR EXAMS: Neurological:     Mental Status: He is alert and oriented to person, place, and time.     Comments: Pt is alert, oriented to person, place, month/year. Mild anomia Mild right hemiparesis RUE: 4+/5, RLE 3- HF, KE, 4/5 ankle DF (confounded by RTKA). LUE and LLE  5/5. No focal sensory loss.  . Normal resting tone. MSK- R knee flexion at 90 deg, lacks 30deg knee ext RIght ankle - + pitting edema but no tenderness to palpation , no  joint swelling    Assessment/Plan: 1. Functional deficits which require 3+ hours per day of interdisciplinary therapy in a comprehensive inpatient rehab setting. Physiatrist is providing close team supervision and 24 hour management of active medical problems listed below. Physiatrist and rehab team continue to assess barriers to discharge/monitor patient progress toward functional and medical goals  Care Tool:  Bathing    Body parts bathed by patient: Right arm, Left arm, Chest, Abdomen   Body parts bathed by helper: Buttocks, Right lower leg, Left lower leg     Bathing assist Assist Level: Supervision/Verbal cueing     Upper Body Dressing/Undressing Upper body dressing   What is the patient wearing?: Pull over shirt    Upper body assist Assist Level: Set up assist    Lower Body Dressing/Undressing Lower body dressing      What is the patient wearing?: Pants     Lower body assist Assist for lower body dressing: Minimal Assistance - Patient > 75%  Toileting Toileting Toileting Activity did not occur Press Photographer and hygiene only): N/A (no void or bm)  Toileting assist Assist for toileting: Contact Guard/Touching assist     Transfers Chair/bed transfer  Transfers assist     Chair/bed transfer assist level: Supervision/Verbal cueing     Locomotion Ambulation   Ambulation assist      Assist level: Supervision/Verbal cueing Assistive device: Walker-rolling Max distance: 200   Walk 10 feet activity   Assist     Assist level: Supervision/Verbal cueing Assistive device: Walker-rolling   Walk 50 feet activity   Assist    Assist level: Supervision/Verbal cueing Assistive device: Walker-rolling    Walk 150 feet activity   Assist    Assist level: Supervision/Verbal cueing Assistive device: Walker-rolling    Walk 10 feet on uneven surface  activity   Assist Walk 10 feet on uneven surfaces activity did not occur:  Safety/medical concerns         Wheelchair     Assist Is the patient using a wheelchair?: Yes Type of Wheelchair: Manual    Wheelchair assist level: Dependent - Patient 0% Max wheelchair distance: 200 ft    Wheelchair 50 feet with 2 turns activity    Assist        Assist Level: Dependent - Patient 0%   Wheelchair 150 feet activity     Assist      Assist Level: Dependent - Patient 0%   Blood pressure 121/85, pulse 69, temperature 97.8 F (36.6 C), resp. rate 17, height 6' 1 (1.854 m), weight 102.2 kg, SpO2 94%.  Medical Problem List and Plan: 1. 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             -patient may shower -ELOS/Goals: 01/27/23 days, goals supervision with PT, OT and min-sup with SLP   -Continue CIR  2.  Antithrombotics: -DVT/anticoagulation:  Mechanical: Sequential compression devices, below knee Right lower extremity in addition to below.  -LE dopplers 01/13/23 were clear. -antiplatelet therapy: Aspirin  and Brilinta  for 30 days followed by aspirin  alone. Last dose of Brilinta : 02/11/2023.   3. Pain Management: Tylenol , Robaxin , oxycodone  as needed   -ankle pain mild instability due to CVA, Xray neg will order air splint   4. Mood/Behavior/Sleep: LCSW to evaluate and provide emotional support             -continue Ambien  10 mg q HS             -continue Lexapro  20 daily             -antipsychotic agents: n/a   5. Neuropsych/cognition: This patient is capable of making decisions on his own behalf.   6. Skin/Wound Care: Routine skin care checks             -maintain Aquacel surgical dressing   7. Fluids/Electrolytes/Nutrition: Routine Is and Os and follow-up chemistries   8: Hypertension: monitor TID and prn (see BPH meds below); also intermittent Hypotension, see #19             -continue Toprol  12.5 mg q HS              -1/4-5/25 BPs great, cont regimen  -1/11-12/25 BPs doing well, monitor Vitals:   01/21/23 1941 01/21/23 2132 01/22/23 0341 01/22/23 1445  BP: 127/75 (!) 151/78 (!) 158/82 121/68   01/22/23 1939 01/23/23 0522 01/23/23 1500 01/23/23 1932  BP:  135/74 115/68 118/75 121/72   01/24/23 0324 01/24/23 1342 01/24/23 1951 01/25/23 0425  BP: 109/71 123/71 128/74 121/85     9: Hyperlipidemia: continue rosuvastatin  20mg  daily   10: BPH: continue Flomax  0.4mg  daily and Proscar  5mg  daily   11: Parkinson disease, possibly ET/PD:  -Sinemet  25/100, 2 tablets at 7 AM/2 tablets at 10 AM/2 at 1pm/2 at 4pm/2 at 7pm and 50/200 at bedtime -continue ropinirole  1 mg TID for rigidity -follows with Dr. Evonnie   12: Allergic rhinitis: continue Zaditor  gtts, Astelin  spray, Flonase , Claritin  10mg  daily   13: Asthma: continue Singulair  10mg  daily   14: History of CP, NSTEMI, CAD, RBBB and high burden of PVCs s/p Link monitor in past -continue mexiletine 150 mg BID -Plavix  discontinued and now on asa and Brilinta  -continue Crestor  20 mg daily -follows with Dr. Jeffrie   15: Mexiletine GERD/Barrett's esophagus: Protonix  40mg  daily   16: Primary OA of right knee s/p TKA 01/11/2023 (previous left TKA)             -maintain zero degree knee bone foam 10-15 minutes TID             -range of motion in bed/with therapy CPM Comp hose  Appreciate ortho note , f/u next week wound check  17: Mild ABLA: follow-up CBC      Latest Ref Rng & Units 01/25/2023    4:55 AM 01/18/2023    5:27 AM 01/16/2023    6:12 AM  CBC  WBC 4.0 - 10.5 K/uL 8.0  7.9  8.2   Hemoglobin 13.0 - 17.0 g/dL 88.3  87.8  87.6   Hematocrit 39.0 - 52.0 % 35.2  36.8  37.6   Platelets 150 - 400 K/uL 352  275  252       18: Anti-constipation: continue colace 100 mg BID; prns ordered  -01/17/23 LBM , increase colace to 200mg    -01/24/23 LBM this morning, cont regimen   19: History of chronic intermittent hypotension:             -continue  midodrine  5 mg BID for systolic BP < 100     LOS: 10 days A FACE TO FACE EVALUATION WAS PERFORMED  Prentice FORBES Compton 01/25/2023, 8:13 AM

## 2023-01-25 NOTE — Progress Notes (Signed)
 Physical Therapy Session Note  Patient Details  Name: Joshua [name] MRN: [id number] Date of Birth: Jun 11, 1945  Today's Date: 01/25/2023 PT Individual Time: 1502-1608 PT Individual Time Calculation (min): 66 min   Short Term Goals: Week 1:  PT Short Term Goal 1 (Week 1): STG = LTG d/t ELOS Week 2:  PT Short Term Goal 1 (Week 2): STG = LTG d/t ELOS  Skilled Therapeutic Interventions/Progress Updates:  Patient seated upright in recliner on entrance to room. Wife present for family education. Patient alert and agreeable to PT session.   Patient with no overt pain complaint at start of session.  Therapeutic Activity: Bed Mobility: Pt performed supine <> sit with Mod I requiring no cues for technique.  Transfers: Pt performed sit<>stand and stand pivot transfers throughout session with distant supervision/ Mod I using RW.  With wife present, pt is able to perform car transfer taking time and care to safely bend R knee to bring LE into footwell of car. Overall distant supervision/ Mod I for transfer with wife tending to RW. Significant improvement from start of care.   Gait Training:  Pt ambulated >350 ft using RW with Mod I/ distant supervision. Pt also guided for first time in ambulation without use of AD. Is able to complete 30 ft initially, then is able to complete distance back to room, ~175 ft with no AD. Pt demos only slightly shorter step lengths throughout. With fatigue, produces increased forward flexion in trunk and slightly increased Bil knee flexion but is able to complete distance. VC throughout for upright posture and slowing pace for energy conservation.   Educated wife on use of gait belt with close proximity and light hold to belt. Belt needed to gauge pt's balance and adjustments made minimally with initial changes.   Completes twelve 6-in steps using BHR and even leading with RLE. Also completes 5 curb step with no cues and distant supervision using RW. Remembers all  steps from previous training.   Discussion on use of backward stepping with RLE with focus on touching toe first and then dropping heel to floor for gastroc soleus stretch as well as quad/ glute activation for increasing knee extension through more functional movement. Also discussed use of heel cushion from Dr. Edna but rolling towel under knee for support. After position for 5-84min, unroll towel slightly and allow for further passive knee extension. When knee is unsupported, more chance for pain and difficulty with active flexion after. Continue to progress as able with pain. Goal is to reach 0* of extension which pt is lacking ~10* currently. Attempt to measure active knee flexion and pt able to reach 112* following all ambulation.   Pt and wife with no concerns re: return home and wife very pleased with pt's progression.   Patient seated upright in recliner at end of session with brakes locked, seat pad alarm set, and all needs within reach. Discussed potential for Mod I in room tomorrow.    Therapy Documentation Precautions:  Precautions Precautions: Knee, Fall Precaution Comments: R TKA (need to rest in extension), R hemi/inattention, Expressive aphasia, CPM to R knee at least once daily Restrictions Weight Bearing Restrictions Per Provider Order: Yes RLE Weight Bearing Per Provider Order: Weight bearing as tolerated  Pain: No overt pain indicated this session. Swelling improvement from one week ago.    Therapy/Group: Individual Therapy  Mliss DELENA Milliner PT, DPT, CSRS 01/25/2023, 6:25 PM

## 2023-01-25 NOTE — Progress Notes (Signed)
 Occupational Therapy Session Note  Patient Details  Name: Joshua [name] MRN: [id number] Date of Birth: 15-Aug-1945  Today's Date: 01/25/2023 OT Individual Time: 9169-9069 and 1305-1400 OT Individual Time Calculation (min): 60 min and 55 min   Short Term Goals: Week 2:  OT Short Term Goal 1 (Week 2): STGs = LTGs  Skilled Therapeutic Interventions/Progress Updates:    Visit 1: Pain:no c/o pain  Pt received in bed and ready to shower.  While OT gathering supplies, pt worked on AROM of RLE in supine and sitting at EOB.  Pt stood up with no A or cues needed to Rw (mod I),  ambulated to bathroom with supervision with RW.  Sat on shower seat to bathe. Used long handled sponge for his feet. Mod I with showering.  Pt dried self and ambulated to EOB to dress.  Donned all clothing without assist except for assist to don tight above knee compression hose on RLE.  Used sock aide to don sock over R foot.  Ended up removing sock as it was too tight to fit in his shoe.  Pt then stood at sink to brush teeth. Pt resting in recliner at end of session with all needs met. Alarm set, call light in reach.    Visit 2: Pain:  no c/o pain Pt's wife present for family education. Reviewed with her how independent pt was this am with shower and dressing but he does need min A to don thigh high ted hose on by starting over his foot. Pt can then pull up from his ankle to his thigh.  Pt demonstrated how he completes sit to stands, ambulation with RW, shower stall transfers, sit to stands on shower seat all with supervision.  Discussed his ongoing challenges with R hand numbness due to carpal tunnel, old wrist injury (no passive wrist extension), tremors from parkinsons,and new increased weakness from this CVA.  Pt stated he can no longer write.  Tried several strategies together to assist with legibility (built up pencil, bracing arm against torso, 1.5 lb wrist cuff) but his legibility did not improve.  Talked about  modifications of using voice to type function in notes section of his ipad and then printing the note out if he has a specific letter or list he needs to make.  Pt provided with red theraband and practiced exercises for his HEP. Pt provided with printed hand out of band exercises for shoulders, chest, back, biceps, and tricpes.  Pt worked on all exercises with his wife observing. Also discussed returning to the pool to work on AROM of RUE in the water .  Pt resting in recliner with all needs met and wife present.   Therapy Documentation Precautions:  Precautions Precautions: Knee, Fall Precaution Comments: R TKA (need to rest in extension), R hemi/inattention, Expressive aphasia, CPM to R knee at least once daily Restrictions Weight Bearing Restrictions Per Provider Order: Yes RLE Weight Bearing Per Provider Order: Weight bearing as tolerated Therapy Vitals Temp: 97.7 F (36.5 C) Temp Source: Oral Pulse Rate: 72 Resp: 18 BP: 116/72 Patient Position (if appropriate): Sitting Oxygen  Therapy SpO2: 98 % O2 Device: Room Air    ADL: ADL Eating: Independent Where Assessed-Eating: Bed level Grooming: Independent Where Assessed-Grooming: Standing at sink Upper Body Bathing: Independent Where Assessed-Upper Body Bathing: Shower Lower Body Bathing: Modified independent Where Assessed-Lower Body Bathing: Shower Upper Body Dressing: Independent Where Assessed-Upper Body Dressing: Chair Lower Body Dressing: Setup (min A with TED hose) Where Assessed-Lower Body Dressing: Chair  Toileting: Modified independent Where Assessed-Toileting: Teacher, Adult Education: Distant supervision Statistician Method: Proofreader: Bedside commode, Other (comment) (RW) Film/video Editor: Close supervision Film/video Editor Method: Designer, Industrial/product: Information systems manager with back, Grab bars   Therapy/Group: Individual Therapy  Delina Kruczek 01/25/2023,  3:18 PM

## 2023-01-25 NOTE — Progress Notes (Signed)
 Speech Language Pathology Daily Session Note  Patient Details  Name: Joshua [name] MRN: [id number] Date of Birth: [birth year]  Today's Date: 01/25/2023 SLP Individual Time: 0203-0230 SLP Individual Time Calculation (min): 27 min  Short Term Goals: Week 2: SLP Short Term Goal 1 (Week 2): STG = LTG due to ELOS  Skilled Therapeutic Interventions:  Patient was seen in PM for family education session. Pt was alert and seated upright in recliner with wife present. Upon SLP arrival, pt and his wife expressing satisfaction with amount of progress patient has made cognitively and with his language. SLP addressed cognition and language as focus of session. SLP reviewed anomia dx along with fluency changes post CVA. SLP instructed pt and family in recommended compensatory strategies and examples of utilization as well as proposed HEP. SLP also discussed cognition through recommendations for internal and external aids and general brain health. All information supplemented via handouts. Pt and his wife in agreement for continued SLP post discharge. Pt's wife thoughtfully engaged throughout session including asking questions and providing insight as appropriate. At conclusion of session, pt was left seated upright in recliner with call button within reach and chair alarm active. SLP to continue POC.   Pain Pain Assessment Pain Scale: 0-10 Pain Score: 6  Pain Type: Surgical pain Pain Location: Leg Pain Orientation: Right Pain Descriptors / Indicators: Aching Pain Intervention(s): RN made aware  Therapy/Group: Individual Therapy  [name] 01/25/2023, 3:36 PM

## 2023-01-26 ENCOUNTER — Ambulatory Visit: Payer: Medicare HMO | Admitting: Neurology

## 2023-01-26 ENCOUNTER — Other Ambulatory Visit: Payer: Self-pay | Admitting: Physician Assistant

## 2023-01-26 DIAGNOSIS — I63412 Cerebral infarction due to embolism of left middle cerebral artery: Secondary | ICD-10-CM | POA: Diagnosis not present

## 2023-01-26 DIAGNOSIS — I63512 Cerebral infarction due to unspecified occlusion or stenosis of left middle cerebral artery: Secondary | ICD-10-CM

## 2023-01-26 DIAGNOSIS — I493 Ventricular premature depolarization: Secondary | ICD-10-CM

## 2023-01-26 DIAGNOSIS — I4510 Unspecified right bundle-branch block: Secondary | ICD-10-CM

## 2023-01-26 DIAGNOSIS — Z96651 Presence of right artificial knee joint: Secondary | ICD-10-CM | POA: Diagnosis not present

## 2023-01-26 DIAGNOSIS — N400 Enlarged prostate without lower urinary tract symptoms: Secondary | ICD-10-CM | POA: Diagnosis not present

## 2023-01-26 DIAGNOSIS — G20B1 Parkinson's disease with dyskinesia, without mention of fluctuations: Secondary | ICD-10-CM | POA: Diagnosis not present

## 2023-01-26 NOTE — Progress Notes (Signed)
 Speech Language Pathology Discharge Summary  Patient Details  Name: Joshua [name] MRN: [id number] Date of Birth: 1945/06/20  Date of Discharge from SLP service:January 26, 2023  Today's Date: 01/26/2023 SLP Individual Time: 1001-1100 SLP Individual Time Calculation (min): 59 min   Skilled Therapeutic Interventions:  Patient was seen in am to address cognitive re- training and expressive langugae. Pt was alert and seated upright in recliner upon SLP arrival. He endorsed pain however, nurse in and out during session and providing medication administration. SLP challenged pt in recall of compensatory word finding strategies. Pt with some diffiuclty differentiating word finding strategies vs. WRAP memory strategies. SLP reviewed circumlocution and substitution strategies and examples of utilization. Pt was challenged in guided conversational exchange to utilize word finding strategies. In comparison to earlier SLP sessions with this SLP pt presents with improved fluency with less instances of hesitations, initial consonant repetitions, and false starts. Pt with x2 instances of anomia with ability to recover in one instance and requiring min cues for use of strategies in other instances. SLP addressed cognition through executive function scheduling task and bill interpretation. Pt completed task with ~90% acc while initially requiring min A for error awareness with strategies able to be decreased to sup A. At conclusion of session SLP initiated memory book with pt able to recall previous events of morning indep. Pt was left seated upright in recliner with call button within reach. SLP to continue POC.    Patient has met 6 of 6 long term goals.  Patient to discharge at overall Supervision;Min level.  Reasons goals not met:     Clinical Impression/Discharge Summary: Pt has made excellent gains and has met 6 of 6 LTG's this admission due to improved expressive language, recall, attention, awareness,  and problem solving. Pt is currently an overall sup to min A for cognitive and language tasks. Pt/ family education complete and pt will discharge home with 24 hour supervision from family. Pt would benefit from home health or outpatient f/u SLP services to maximize cognition and language in order to maximize his functional independence.  Care Partner:  Caregiver Able to Provide Assistance: Yes  Type of Caregiver Assistance: Cognitive  Recommendation:  Home Health SLP;Outpatient SLP;24 hour supervision/assistance  Rationale for SLP Follow Up: Maximize functional communication;Maximize cognitive function and independence   Equipment:  none   Reasons for discharge: Treatment goals met;Discharged from hospital   Patient/Family Agrees with Progress Made and Goals Achieved: Yes    [name] 01/26/2023, 3:58 PM

## 2023-01-26 NOTE — Plan of Care (Signed)
  Problem: RH Expression Communication Goal: LTG Patient will verbally express basic/complex needs(SLP) Description: LTG:  Patient will verbally express basic/complex needs, wants or ideas with cues  (SLP) Outcome: Completed/Met Flowsheets (Taken 01/26/2023 1134) LTG: Patient will verbally express basic/complex needs, wants or ideas (SLP): Supervision Goal: LTG Patient will increase word finding of common (SLP) Description: LTG:  Patient will increase word finding of common objects/daily info/abstract thoughts with cues using compensatory strategies (SLP). Outcome: Completed/Met Flowsheets (Taken 01/26/2023 1134) LTG: Patient will increase word finding of common (SLP): Minimal Assistance - Patient > 75% Patient will use compensatory strategies to increase word finding of: Abstract thoughts   Problem: RH Problem Solving Goal: LTG Patient will demonstrate problem solving for (SLP) Description: LTG:  Patient will demonstrate problem solving for basic/complex daily situations with cues  (SLP) Outcome: Completed/Met Flowsheets (Taken 01/16/2023 1237 by Sockwell, Cassidi F, CCC-SLP) LTG: Patient will demonstrate problem solving for (SLP): Basic daily situations LTG Patient will demonstrate problem solving for: Minimal Assistance - Patient > 75%   Problem: RH Memory Goal: LTG Patient will use memory compensatory aids to (SLP) Description: LTG:  Patient will use memory compensatory aids to recall biographical/new, daily complex information with cues (SLP) Outcome: Completed/Met Flowsheets (Taken 01/16/2023 1237 by Sockwell, Cassidi F, CCC-SLP) LTG: Patient will use memory compensatory aids to (SLP): Minimal Assistance - Patient > 75%   Problem: RH Attention Goal: LTG Patient will demonstrate this level of attention during functional activites (SLP) Description: LTG:  Patient will will demonstrate this level of attention during functional activites (SLP) Outcome: Completed/Met Flowsheets (Taken  01/26/2023 1134) LTG: Patient will demonstrate this level of attention during cognitive/linguistic activities with assistance of (SLP): Minimal Assistance - Patient > 75%   Problem: RH Awareness Goal: LTG: Patient will demonstrate awareness during functional activites type of (SLP) Description: LTG: Patient will demonstrate awareness during functional activites type of (SLP) Outcome: Completed/Met Flowsheets (Taken 01/26/2023 1134) Patient will demonstrate during cognitive/linguistic activities awareness type of: Emergent LTG: Patient will demonstrate awareness during cognitive/linguistic activities with assistance of (SLP): Minimal Assistance - Patient > 75%

## 2023-01-26 NOTE — Progress Notes (Signed)
 Physical Therapy Session Note  Patient Details  Name: Joshua [name] MRN: [id number] Date of Birth: 01-Dec-1945  Today's Date: 01/26/2023 PT Individual Time: 1122-1206 PT Individual Time Calculation (min): 44 min   Short Term Goals: Week 1:  PT Short Term Goal 1 (Week 1): STG = LTG d/t ELOS Week 2:  PT Short Term Goal 1 (Week 2): STG = LTG d/t ELOS  Skilled Therapeutic Interventions/Progress Updates:  Patient seated upright in recliner on entrance to room. Patient alert and agreeable to PT session.   Patient with no pain complaint at start of session.  Therapeutic Activity: Transfers: Pt performed sit<>stand and stand pivot transfers throughout session with Mod I. NO cueing required. Pt performs ambulatory transfer to bathroom with no AD and close supervision. Is able to toilet with IND.   Gait Training:  Pt ambulated >200 ft x2 using RW with supervision/ Mod I. Demonstrated good strength and stamina with good stride however continues to demo Bil knee flexion in stance.   Neuromuscular Re-ed: NMR facilitated during session with focus on standing balance and motor control. Pt guided in use of RUE to place safety pins on basketball net placed in highest position. 4# aw placed on R wrist to improve tremors. Pt is able to complete all blue and green pins with no physical assist although does require extra time d/t difficulty with proper pin placement in fingertips and then to zero in on sides of net. Alternates hands to remove and provide LUE with intermittent rest. No LOB. Stands throughout and takes small steps with no AD to reposition self.   NMR performed for improvements in motor control and coordination, balance, sequencing, judgement, and self confidence/ efficacy in performing all aspects of mobility at highest level of independence.   Patient seated in recliner at end of session with brakes locked, no alarm set as pt made Mod I in room with use of RW and all needs within  reach.   Therapy Documentation Precautions:  Precautions Precautions: Knee, Fall Precaution Comments: R TKA (need to rest in extension), R hemi/inattention, Expressive aphasia, CPM to R knee at least once daily Restrictions Weight Bearing Restrictions Per Provider Order: Yes RLE Weight Bearing Per Provider Order: Weight bearing as tolerated  Pain: Pain Assessment Pain Scale: 0-10 Pain Score: 6  Pain Type: Acute pain Pain Location: Leg Pain Orientation: Right Pain Descriptors / Indicators: Aching Pain Intervention(s): RN made aware   Therapy/Group: Individual Therapy  Mliss DELENA Milliner PT, DPT, CSRS 01/26/2023, 6:28 PM

## 2023-01-26 NOTE — Progress Notes (Signed)
 Inpatient Rehabilitation Discharge Medication Review by a Pharmacist  A complete drug regimen review was completed for this patient to identify any potential clinically significant medication issues.  High Risk Drug Classes Is patient taking? Indication by Medication  Antipsychotic No   Anticoagulant No   Antibiotic No   Opioid Yes Oxycodone  - pain  Antiplatelet Yes Aspirin , Brilinta  x 30 days, then aspirin  alone starting 02/12/23: CVA prophylaxis   Hypoglycemics/insulin No   Vasoactive Medication Yes Toprol , Mexiletine, midodrine , Flomax , nitroglycerin : HTN, BPH, NSTEMI, CAD, PVCs, intermittent hypotension  Chemotherapy No   Other Yes Prevacid : reflux   Dymista , Flonase , Xyzal , singulair : allergies, rhinitis, asthma Sinemet , Ropinirole : parkinson's Disease Lexapro : mood Finasteride : BPH Ketotifen /Azelastine : eye care  Crestor /fish oil: HLD Ambien : sleep Albuterol  - short of breath B12 - anemia Biofreeze - pain Proctosol - hemorrhoids Vitamin D  - supplentation     Type of Medication Issue Identified Description of Issue Recommendation(s)  Drug Interaction(s) (clinically significant)     Duplicate Therapy     Allergy     No Medication Administration End Date     Incorrect Dose     Additional Drug Therapy Needed     Significant med changes from prior encounter (inform family/care partners about these prior to discharge).    Other       Clinically significant medication issues were identified that warrant physician communication and completion of prescribed/recommended actions by midnight of the next day:  No  Time spent performing this drug regimen review (minutes):  45   Thank you for allowing pharmacy to be a part of this patient's care.   Sergio Batch, PharmD, BCIDP, AAHIVP, CPP Infectious Disease Pharmacist 01/26/2023 9:37 AM

## 2023-01-26 NOTE — Progress Notes (Signed)
 PROGRESS NOTE   Subjective/Complaints:  No issues overnite , discussed Zio patch placement  Amb back from gym without device!  ROS:  Denies CP, SOB, abd pain, N/V/D/C, or any other complaints at this time.    Objective:   DG Knee 1-2 Views Right Result Date: 01/25/2023 CLINICAL DATA:  Postoperative state.  Evaluate hardware. EXAM: RIGHT KNEE - 1-2 VIEW COMPARISON:  Right knee radiographs 01/11/2023 FINDINGS: Redemonstration of total right knee arthroplasty. No perihardware lucency is seen to indicate hardware failure or loosening. Resolution of the prior intra-articular and subcutaneous postoperative air. No significant joint effusion, with decrease in the prior joint fluid. No acute fracture or dislocation. Mild anterior knee soft tissue swelling is similar to prior. Mild atherosclerotic calcifications. IMPRESSION: Total right knee arthroplasty without evidence of hardware failure. Electronically Signed   By: Tanda Lyons M.D.   On: 01/25/2023 11:42    Recent Labs    01/25/23 0455  WBC 8.0  HGB 11.6*  HCT 35.2*  PLT 352    Recent Labs    01/25/23 0455  NA 136  K 4.2  CL 105  CO2 23  GLUCOSE 106*  BUN 16  CREATININE 0.93  CALCIUM  9.0       Intake/Output Summary (Last 24 hours) at 01/26/2023 0752 Last data filed at 01/26/2023 0700 Gross per 24 hour  Intake 717 ml  Output --  Net 717 ml         Physical Exam: Vital Signs Blood pressure 124/73, pulse 68, temperature 98.2 F (36.8 C), resp. rate 17, height 6' 1 (1.854 m), weight 102.2 kg, SpO2 96%.   General: No acute distress, laying in bed.  Mood and affect are appropriate Heart: Regular rate and rhythm no rubs murmurs or extra sounds Lungs: Clear to auscultation, breathing unlabored, no rales or wheezes Abdomen: Positive bowel sounds, soft nontender to palpation, nondistended Extremities: RLE swelling in the knee area, no thigh tenderness on R side,  trace edema at the pretibial area-stable Skin: No evidence of breakdown, no evidence of rash, small spot 2cm of dried blood on post op dressing   PRIOR EXAMS: Neurological:     Mental Status: He is alert and oriented to person, place, and time.     Comments: Pt is alert, oriented to person, place, month/year. Mild anomia Mild right hemiparesis RUE: 4+/5, RLE 3- HF, KE, 4/5 ankle DF (confounded by RTKA). LUE and LLE  5/5. No focal sensory loss.  . Normal resting tone. MSK- R knee flexion at 90 deg, lacks 30deg knee ext RIght ankle - + pitting edema but no tenderness to palpation , no joint swelling    Assessment/Plan: 1. Functional deficits which require 3+ hours per day of interdisciplinary therapy in a comprehensive inpatient rehab setting. Physiatrist is providing close team supervision and 24 hour management of active medical problems listed below. Physiatrist and rehab team continue to assess barriers to discharge/monitor patient progress toward functional and medical goals  Care Tool:  Bathing    Body parts bathed by patient: Right arm, Left arm, Chest, Abdomen   Body parts bathed by helper: Buttocks, Right lower leg, Left lower leg     Bathing  assist Assist Level: Supervision/Verbal cueing     Upper Body Dressing/Undressing Upper body dressing   What is the patient wearing?: Pull over shirt    Upper body assist Assist Level: Set up assist    Lower Body Dressing/Undressing Lower body dressing      What is the patient wearing?: Pants     Lower body assist Assist for lower body dressing: Minimal Assistance - Patient > 75%     Toileting Toileting Toileting Activity did not occur (Clothing management and hygiene only): N/A (no void or bm)  Toileting assist Assist for toileting: Contact Guard/Touching assist     Transfers Chair/bed transfer  Transfers assist     Chair/bed transfer assist level: Supervision/Verbal cueing      Locomotion Ambulation   Ambulation assist      Assist level: Supervision/Verbal cueing Assistive device: Walker-rolling Max distance: 200   Walk 10 feet activity   Assist     Assist level: Supervision/Verbal cueing Assistive device: Walker-rolling   Walk 50 feet activity   Assist    Assist level: Supervision/Verbal cueing Assistive device: Walker-rolling    Walk 150 feet activity   Assist    Assist level: Supervision/Verbal cueing Assistive device: Walker-rolling    Walk 10 feet on uneven surface  activity   Assist Walk 10 feet on uneven surfaces activity did not occur: Safety/medical concerns         Wheelchair     Assist Is the patient using a wheelchair?: Yes Type of Wheelchair: Manual    Wheelchair assist level: Dependent - Patient 0% Max wheelchair distance: 200 ft    Wheelchair 50 feet with 2 turns activity    Assist        Assist Level: Dependent - Patient 0%   Wheelchair 150 feet activity     Assist      Assist Level: Dependent - Patient 0%   Blood pressure 124/73, pulse 68, temperature 98.2 F (36.8 C), resp. rate 17, height 6' 1 (1.854 m), weight 102.2 kg, SpO2 96%.  Medical Problem List and Plan: 1. 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             -patient may shower -ELOS/Goals: 01/27/23 days, goals supervision with PT, OT and min-sup with SLP   -Continue CIR ?modI in room today   2.  Antithrombotics: -DVT/anticoagulation:  Mechanical: Sequential compression devices, below knee Right lower extremity in addition to below.  -LE dopplers 01/13/23 were clear. -antiplatelet therapy: Aspirin  and Brilinta  for 30 days followed by aspirin  alone. Last dose of Brilinta : 02/11/2023.   3. Pain Management: Tylenol , Robaxin , oxycodone  as needed   -ankle pain mild instability due to CVA, Xray  neg will order air splint   4. Mood/Behavior/Sleep: LCSW to evaluate and provide emotional support             -continue Ambien  10 mg q HS             -continue Lexapro  20 daily             -antipsychotic agents: n/a   5. Neuropsych/cognition: This patient is capable of making decisions on his own behalf.   6. Skin/Wound Care: Routine skin care checks             -maintain Aquacel surgical dressing   7. Fluids/Electrolytes/Nutrition: Routine Is and Os and follow-up chemistries   8: Hypertension: monitor TID  and prn (see BPH meds below); also intermittent Hypotension, see #19             -continue Toprol  12.5 mg q HS             -1/4-5/25 BPs great, cont regimen  -1/11-12/25 BPs doing well, monitor Vitals:   01/22/23 1445 01/22/23 1939 01/23/23 0522 01/23/23 1500  BP: 121/68 135/74 115/68 118/75   01/23/23 1932 01/24/23 0324 01/24/23 1342 01/24/23 1951  BP: 121/72 109/71 123/71 128/74   01/25/23 0425 01/25/23 1419 01/25/23 1937 01/26/23 0510  BP: 121/85 116/72 122/71 124/73     9: Hyperlipidemia: continue rosuvastatin  20mg  daily   10: BPH: continue Flomax  0.4mg  daily and Proscar  5mg  daily   11: Parkinson disease, possibly ET/PD:  -Sinemet  25/100, 2 tablets at 7 AM/2 tablets at 10 AM/2 at 1pm/2 at 4pm/2 at 7pm and 50/200 at bedtime -continue ropinirole  1 mg TID for rigidity -follows with Dr. Evonnie   12: Allergic rhinitis: continue Zaditor  gtts, Astelin  spray, Flonase , Claritin  10mg  daily   13: Asthma: continue Singulair  10mg  daily   14: History of CP, NSTEMI, CAD, RBBB and high burden of PVCs s/p Link monitor in past -continue mexiletine 150 mg BID -Plavix  discontinued and now on asa and Brilinta  -continue Crestor  20 mg daily -follows with Dr. Jeffrie   15: GERD/Barrett's esophagus: Protonix  40mg  daily   16: Primary OA of right knee s/p TKA 01/11/2023 (previous left TKA)             -maintain zero degree knee bone foam 10-15 minutes TID             -range of motion in  bed/with therapy CPM Comp hose  Appreciate ortho note , f/u next week wound check  17: Mild ABLA: follow-up CBC      Latest Ref Rng & Units 01/25/2023    4:55 AM 01/18/2023    5:27 AM 01/16/2023    6:12 AM  CBC  WBC 4.0 - 10.5 K/uL 8.0  7.9  8.2   Hemoglobin 13.0 - 17.0 g/dL 88.3  87.8  87.6   Hematocrit 39.0 - 52.0 % 35.2  36.8  37.6   Platelets 150 - 400 K/uL 352  275  252       18: Anti-constipation: continue colace 100 mg BID; prns ordered  -01/17/23 LBM , increase colace to 200mg    -01/24/23 LBM this morning, cont regimen   19: History of chronic intermittent hypotension:             -continue midodrine  5 mg BID for systolic BP < 100     LOS: 11 days A FACE TO FACE EVALUATION WAS PERFORMED  Joshua [name] 01/26/2023, 7:52 AM

## 2023-01-26 NOTE — Plan of Care (Signed)
  Problem: RH Balance Goal: LTG Patient will maintain dynamic standing with ADLs (OT) Description: LTG:  Patient will maintain dynamic standing balance with assist during activities of daily living (OT)  Outcome: Completed/Met   Problem: RH Eating Goal: LTG Patient will perform eating w/assist, cues/equip (OT) Description: LTG: Patient will perform eating with assist, with/without cues using equipment (OT) Outcome: Completed/Met   Problem: RH Grooming Goal: LTG Patient will perform grooming w/assist,cues/equip (OT) Description: LTG: Patient will perform grooming with assist, with/without cues using equipment (OT) Outcome: Completed/Met   Problem: RH Bathing Goal: LTG Patient will bathe all body parts with assist levels (OT) Description: LTG: Patient will bathe all body parts with assist levels (OT) Outcome: Completed/Met   Problem: RH Dressing Goal: LTG Patient will perform upper body dressing (OT) Description: LTG Patient will perform upper body dressing with assist, with/without cues (OT). Outcome: Completed/Met Goal: LTG Patient will perform lower body dressing w/assist (OT) Description: LTG: Patient will perform lower body dressing with assist, with/without cues in positioning using equipment (OT) Outcome: Completed/Met   Problem: RH Toileting Goal: LTG Patient will perform toileting task (3/3 steps) with assistance level (OT) Description: LTG: Patient will perform toileting task (3/3 steps) with assistance level (OT)  Outcome: Completed/Met   Problem: RH Functional Use of Upper Extremity Goal: LTG Patient will use RT/LT upper extremity as a (OT) Description: LTG: Patient will use right/left upper extremity as a stabilizer/gross assist/diminished/nondominant/dominant level with assist, with/without cues during functional activity (OT) Outcome: Completed/Met   Problem: RH Toilet Transfers Goal: LTG Patient will perform toilet transfers w/assist (OT) Description: LTG:  Patient will perform toilet transfers with assist, with/without cues using equipment (OT) Outcome: Completed/Met   Problem: RH Tub/Shower Transfers Goal: LTG Patient will perform tub/shower transfers w/assist (OT) Description: LTG: Patient will perform tub/shower transfers with assist, with/without cues using equipment (OT) Outcome: Completed/Met

## 2023-01-26 NOTE — Progress Notes (Signed)
 Occupational Therapy Discharge Summary  Patient Details  Name: Joshua [name] MRN: [id number] Date of Birth: [birth year]  Date of Discharge from OT service:January 26, 2023  Today's Date: 01/26/2023 OT Individual Time: 9169-9069 OT Individual Time Calculation (min): 60 min   Pt requested to complete a shower this am. See ADL documentation below.  Continues to need reminders to sit down to doff pants over feet vs standing and alternating standing on 1 leg.  After completing self care, pt practiced his HEP with red theraband.  Pt did well with all exercises.  Pt resting in recliner with all needs met.    Patient has met 10 of 10 long term goals due to improved activity tolerance, improved balance, ability to compensate for deficits, functional use of  RIGHT upper and RIGHT lower extremity, improved attention, improved awareness, and improved coordination.  Patient to discharge at overall Supervision level.  Patient's care partner is independent to provide the necessary physical and cognitive assistance at discharge.  Pt's wife attended family education session.   Reasons goals not met: n/a  Recommendation:  Patient will benefit from ongoing skilled OT services in home health setting to continue to advance functional skills in the area of BADL and iADL.  Equipment: No equipment provided  Reasons for discharge: treatment goals met  Patient/family agrees with progress made and goals achieved: Yes  OT Discharge Precautions/Restrictions  Precautions Precautions: Knee;Fall Precaution Comments: R TKA (need to rest in extension) RLE Weight Bearing Per Provider Order: Weight bearing as tolerated   Pain Pain Assessment Pain Scale: 0-10 Pain Score: 6  Pain Type: Acute pain Pain Location: Leg Pain Orientation: Right Pain Descriptors / Indicators: Aching Pain Frequency: Constant Pain Onset: On-going Pain Intervention(s): Refused ADL ADL Eating: Independent Where  Assessed-Eating: Bed level Grooming: Independent Where Assessed-Grooming: Standing at sink Upper Body Bathing: Independent Where Assessed-Upper Body Bathing: Shower Lower Body Bathing: Modified independent Where Assessed-Lower Body Bathing: Shower Upper Body Dressing: Independent Where Assessed-Upper Body Dressing: Chair Lower Body Dressing: Setup (min A with TED hose) Where Assessed-Lower Body Dressing: Chair Toileting: Modified independent Where Assessed-Toileting: Teacher, Adult Education: Distant supervision Statistician Method: Proofreader: Bedside commode, Other (comment) (RW) Tub/Shower Transfer: Unable to assess Film/video Editor: Close supervision Film/video Editor Method: Designer, Industrial/product: Information systems manager with back, Grab bars Vision Baseline Vision/History: 1 Wears glasses Patient Visual Report: No change from baseline Vision Assessment?: No apparent visual deficits Visual Fields: No apparent deficits Additional Comments: pt reports that he no longer has diplopia Perception  Perception: Within Functional Limits Praxis Praxis: WFL Cognition Cognition Overall Cognitive Status: Impaired/Different from baseline Arousal/Alertness: Awake/alert Orientation Level: Person;Place;Situation Person: Oriented Place: Oriented Situation: Oriented Memory Impairment: Decreased recall of new information Decreased Short Term Memory: Functional basic Brief Interview for Mental Status (BIMS) Repetition of Three Words (First Attempt): 3 Temporal Orientation: Year: Correct Temporal Orientation: Month: Accurate within 5 days Temporal Orientation: Day: Correct Recall: Sock: Yes, no cue required Recall: Blue: Yes, no cue required Recall: Bed: Yes, no cue required BIMS Summary Score: 15 Sensation Sensation Peripheral sensation comments: RUE numbness (reports present for ~3 months PTA), reports numbness/tingling in BLE. Light  Touch Impaired Details: Impaired RUE Hot/Cold: Appears Intact Proprioception: Appears Intact Coordination Gross Motor Movements are Fluid and Coordinated: No Fine Motor Movements are Fluid and Coordinated: No Motor  Motor Motor - Discharge Observations: mild R hemiplegia, pt has made excellent progress with RUE strength Mobility    Supervision with RW for ADL  transfers Trunk/Postural Assessment  Postural Control Postural Control: Within Functional Limits  Balance Static Sitting Balance Static Sitting - Level of Assistance: 7: Independent Dynamic Sitting Balance Dynamic Sitting - Level of Assistance: 6: Modified independent (Device/Increase time) Static Standing Balance Static Standing - Level of Assistance: 6: Modified independent (Device/Increase time) Dynamic Standing Balance Dynamic Standing - Level of Assistance: 5: Stand by assistance Extremity/Trunk Assessment RUE Assessment Passive Range of Motion (PROM) Comments: 0 wrist extension Active Range of Motion (AROM) Comments: WFL General Strength Comments: proximal strength 4+/5,  limited grasp strength   26 lbs (vs 86 on the L ) - essentilal tremor when trying to write Brunstrum level for arm: Stage V Relative Independence from Synergy Brunstrum level for hand: Stage VI Isolated joint movements LUE Assessment General Strength Comments: WFL; tremors   Nataniel Gasper 01/26/2023, 9:29 AM

## 2023-01-26 NOTE — Progress Notes (Signed)
 Physical Therapy Session Note  Patient Details  Name: Joshua [name] MRN: [id number] Date of Birth: 02-Jul-1945  Today's Date: 01/26/2023 PT Individual Time: 8550-8467 PT Individual Time Calculation (min): 43 min   Short Term Goals: Week 1:  PT Short Term Goal 1 (Week 1): STG = LTG d/t ELOS Week 2:  PT Short Term Goal 1 (Week 2): STG = LTG d/t ELOS  Skilled Therapeutic Interventions/Progress Updates:  Patient seated upright in recliner on entrance to room. Patient alert and agreeable to PT session.   Patient with no pain complaint at start of session.  Therapeutic Activity: Pt guided in continuous reciprocation of BUE and BLE using NuStep L6 x with focus on push into extension initially and then sequentially bringing seat closer to pedals in order to increase movement into flexion.  New R knee ROM measurements obtained: AROM: Right Knee Extension 8 deg  Right Knee Flexion 123 deg  PROM: Right Knee Extension 6 deg  Right Knee Flexion 126 deg    Gait Training:  Pt ambulated >300 ft using RW with supervision/ Mod I. No cueing provided. No LOB or increase in sway. Pt just requires time to complete.   Guided pt again once seated in recliner in room for use of rolled towel under knee when heel placed in knee extension pillow in order to provide support but still allow gravity to pull knee into extension. Pt demos understanding.   Only one issue with word finding during session.   Patient seated upright in recliner at end of session with brakes locked, no alarm set, and all needs within reach.   Therapy Documentation Precautions:  Precautions Precautions: Knee, Fall Precaution Comments: R TKA (need to rest in extension), R hemi/inattention, Expressive aphasia, CPM to R knee at least once daily Restrictions Weight Bearing Restrictions Per Provider Order: Yes RLE Weight Bearing Per Provider Order: Weight bearing as tolerated  Pain: No pain related this session, but  pt ready for cryotherapy and rest at end of session. RN and NT aware.   Therapy/Group: Individual Therapy  Mliss DELENA Milliner PT, DPT, CSRS 01/26/2023, 6:30 PM

## 2023-01-26 NOTE — Progress Notes (Signed)
 Event monitor for stroke  Dr. Anne Fu to read

## 2023-01-26 NOTE — Progress Notes (Signed)
    15 Days Post-op Right Total Knee Arthroplasty  Subjective: Patient reports knee as mildly-moderately sore. Doing well overall. Using CPM machine;[name] Making good progress with PT, reports he's happy with his range of motion and swelling. Speech much improved this week.  Patient hopeful for DC home possibly this week.  No other complaints.   Objective:   VITALS:   Vitals:   01/25/23 0425 01/25/23 1419 01/25/23 1937 01/26/23 0510  BP: 121/85 116/72 122/71 124/73  Pulse: 69 72 73 68  Resp: 17 18 18 17   Temp: 97.8 F (36.6 C) 97.7 F (36.5 C) 98.2 F (36.8 C) 98.2 F (36.8 C)  TempSrc:  Oral    SpO2: 94% 98% 100% 96%  Weight:      Height:        AAOx4, resting comfortably Sensation intact distally Intact pulses distally Dorsiflexion/Plantar flexion intact Incision: dressing C/D/I Compartment soft ROM 2-90 degrees Mild expected post-operative swelling No significant distal edema Wiggles toes appropriately   Lab Results  Component Value Date   WBC 8.0 01/25/2023   HGB 11.6 (L) 01/25/2023   HCT 35.2 (L) 01/25/2023   MCV 92.4 01/25/2023   PLT 352 01/25/2023   BMET    Component Value Date/Time   NA 136 01/25/2023 0455   NA 136 02/14/2021 1528   K 4.2 01/25/2023 0455   CL 105 01/25/2023 0455   CO2 23 01/25/2023 0455   GLUCOSE 106 (H) 01/25/2023 0455   BUN 16 01/25/2023 0455   BUN 20 02/14/2021 1528   CREATININE 0.93 01/25/2023 0455   CREATININE 0.88 09/18/2015 1135   CALCIUM  9.0 01/25/2023 0455   EGFR 73 02/14/2021 1528   GFRNONAA >60 01/25/2023 0455    Xray: Total knee arthroplasty components in good position no adverse features  Assessment/Plan:     Principal Problem:   CVA (cerebral vascular accident) [facility])  S/p R TKA 12/30 post op course complicated by stroke  Neurology following, recs appreciated  1/14: Imaging and examination reassuring.  Coming along well post-operatively, good ROM.  Continue with PT/OT.  Continue DVT prophylaxis per  neurology recs.  Discussed post-operative expectations at length.  Plan to see patient in office for f/u in 4 weeks.   Post op recs: WB: WBAT Mobility: Continue with PT/OT, no formal precautions from knee standpoint Abx: ancef  Bandages: Aquacel removed, can leave wound open to air, no submersion for minimum 8 weeks. Imaging: PACU x-rays and 2 week post-operative x-rays DVT prophylaxis: Aspirin  and Brilinta  per neurology team Follow up: 6 weeks after surgery for a wound check and repeat x-rays with Dr. Edna at [facility].  Address: [address], [place], [place] [address]  Office Phone: [phone]   Joshua [name] 01/26/2023, 6:09 AM   Contact information:   Tzzxijbd 7am-5pm epic message Dr. Edna, or call office for patient follow up: [phone] After hours and holidays please check [email or web address] for group call information for [facility]

## 2023-01-27 ENCOUNTER — Other Ambulatory Visit (HOSPITAL_COMMUNITY): Payer: Self-pay

## 2023-01-27 DIAGNOSIS — I63412 Cerebral infarction due to embolism of left middle cerebral artery: Secondary | ICD-10-CM | POA: Diagnosis not present

## 2023-01-27 DIAGNOSIS — G20B1 Parkinson's disease with dyskinesia, without mention of fluctuations: Secondary | ICD-10-CM | POA: Diagnosis not present

## 2023-01-27 DIAGNOSIS — N400 Enlarged prostate without lower urinary tract symptoms: Secondary | ICD-10-CM | POA: Diagnosis not present

## 2023-01-27 DIAGNOSIS — Z96651 Presence of right artificial knee joint: Secondary | ICD-10-CM | POA: Diagnosis not present

## 2023-01-27 MED ORDER — ACETAMINOPHEN 325 MG PO TABS: 325.0000 mg | ORAL_TABLET | ORAL | Status: AC | PRN

## 2023-01-27 MED ORDER — DOCUSATE SODIUM 100 MG PO CAPS: 200.0000 mg | ORAL_CAPSULE | Freq: Two times a day (BID) | ORAL | Status: AC | PRN

## 2023-01-27 MED ORDER — TICAGRELOR 90 MG PO TABS
90.0000 mg | ORAL_TABLET | Freq: Two times a day (BID) | ORAL | 0 refills | Status: DC
  Filled 2023-01-27: qty 60, 30d supply, fill #0

## 2023-01-27 MED ORDER — TICAGRELOR 90 MG PO TABS: 90.0000 mg | ORAL_TABLET | Freq: Two times a day (BID) | ORAL | 0 refills | Status: DC

## 2023-01-27 MED ORDER — ASPIRIN 81 MG PO TBEC
81.0000 mg | DELAYED_RELEASE_TABLET | Freq: Every day | ORAL | 0 refills | Status: DC
  Filled 2023-01-27: qty 120, 120d supply, fill #0

## 2023-01-27 MED ORDER — OXYCODONE HCL 5 MG PO TABS
5.0000 mg | ORAL_TABLET | ORAL | 0 refills | Status: DC | PRN
  Filled 2023-01-27: qty 15, 7d supply, fill #0

## 2023-01-27 NOTE — Progress Notes (Signed)
 Inpatient Rehabilitation Care Coordinator Discharge Note   Patient Details  Name: Rilen Jerauld MRN: [id number] Date of Birth: 07/24/1945   Discharge location: D/c to home with his wife  Length of Stay: 11 days  Discharge activity level: Mod I  Home/community participation: Limited  Patient response WU:JWJXBJ Literacy - How often do you need to have someone help you when you read instructions, pamphlets, or other written material from your doctor or pharmacy?: Never  Patient response YN:WGNFAO Isolation - How often do you feel lonely or isolated from those around you?: Never  Services provided included: MD, RD, PT, OT, SLP, RN, CM, TR, Pharmacy, Neuropsych, SW  Financial Services:  Field seismologist Utilized: Private Insurance [employer]  Choices offered to/list presented to: patient and pt wife  Follow-up services arranged:  Home Health Home Health Agency: [facility] for PT/OT/SLP/aide         Patient response to transportation need: Is the patient able to respond to transportation needs?: Yes In the past 12 months, has lack of transportation kept you from medical appointments or from getting medications?: No In the past 12 months, has lack of transportation kept you from meetings, work, or from getting things needed for daily living?: No   Patient/Family verbalized understanding of follow-up arrangements:  Yes  Individual responsible for coordination of the follow-up plan: contact pt wife [phone]  Confirmed correct DME delivered: Rennis Case 01/27/2023    Comments (or additional information):fam edu completed  Summary of Stay    Date/Time Discharge Planning CSW  01/25/23 1500 Pt will d/c to home with his wife who will be primary caregiver. Fam edu completed on 1/13 1pm-4pm with pt wife. [facility] for HHPT/OT/SLP/aide. SW will confirm there are no barriers to discharge. AAC  01/19/23 1457 Pt will d/c to home with his wife who will be  primary caregiver. SW will confirm there are no barriers to discharge. AAC  01/18/23 1015 Pt will discharge to home with his wife who will be his primary caregiver. SW will confirm there are no barriers to discharge. AAC       Samyia Motter A Brendolyn Callas

## 2023-01-27 NOTE — Plan of Care (Signed)
  Problem: RH Balance Goal: LTG Patient will maintain dynamic standing balance (PT) Description: LTG:  Patient will maintain dynamic standing balance with assistance during mobility activities (PT) Outcome: Completed/Met Flowsheets (Taken 01/16/2023 1346) LTG: Pt will maintain dynamic standing balance during mobility activities with:: Independent with assistive device    Problem: Sit to Stand Goal: LTG:  Patient will perform sit to stand with assistance level (PT) Description: LTG:  Patient will perform sit to stand with assistance level (PT) Outcome: Completed/Met Flowsheets (Taken 01/27/2023 1052) LTG: PT will perform sit to stand in preparation for functional mobility with assistance level: Independent   Problem: RH Bed Mobility Goal: LTG Patient will perform bed mobility with assist (PT) Description: LTG: Patient will perform bed mobility with assistance, with/without cues (PT). Outcome: Completed/Met Flowsheets (Taken 01/16/2023 1346) LTG: Pt will perform bed mobility with assistance level of: Independent   Problem: RH Car Transfers Goal: LTG Patient will perform car transfers with assist (PT) Description: LTG: Patient will perform car transfers with assistance (PT). Outcome: Completed/Met Flowsheets (Taken 01/16/2023 1349) LTG: Pt will perform car transfers with assist:: Supervision/Verbal cueing   Problem: RH Furniture Transfers Goal: LTG Patient will perform furniture transfers w/assist (OT/PT) Description: LTG: Patient will perform furniture transfers  with assistance (OT/PT). Outcome: Completed/Met Flowsheets (Taken 01/27/2023 1052) LTG: Pt will perform furniture transfers with assist:: Independent with assistive device    Problem: RH Ambulation Goal: LTG Patient will ambulate in controlled environment (PT) Description: LTG: Patient will ambulate in a controlled environment, # of feet with assistance (PT). Outcome: Completed/Met Flowsheets (Taken 01/16/2023 1349) LTG: Pt will  ambulate in controlled environ  assist needed:: Supervision/Verbal cueing LTG: Ambulation distance in controlled environment: at least 300 ft using LRAD Goal: LTG Patient will ambulate in home environment (PT) Description: LTG: Patient will ambulate in home environment, # of feet with assistance (PT). Outcome: Completed/Met Flowsheets Taken 01/27/2023 1052 LTG: Pt will ambulate in home environ  assist needed:: Independent with assistive device Taken 01/16/2023 1349 LTG: Ambulation distance in home environment: up to 50 ft per bout using LRAD   Problem: RH Stairs Goal: LTG Patient will ambulate up and down stairs w/assist (PT) Description: LTG: Patient will ambulate up and down # of stairs with assistance (PT) Outcome: Completed/Met Flowsheets (Taken 01/16/2023 1349) LTG: Pt will ambulate up/down stairs assist needed:: Supervision/Verbal cueing LTG: Pt will  ambulate up and down number of stairs: at least 1 step using LRAD in order to navigate in community   Problem: RH Bed to Chair Transfers Goal: LTG Patient will perform bed/chair transfers w/assist (PT) Description: LTG: Patient will perform bed to chair transfers with assistance (PT). Outcome: Completed/Met Flowsheets (Taken 01/27/2023 1052) LTG: Pt will perform Bed to Chair Transfers with assistance level: Independent with assistive device

## 2023-01-27 NOTE — Progress Notes (Signed)
 PROGRESS NOTE   Subjective/Complaints:  Ziopatch to be mailed to home , has f/u appt with Dr Renna Cary 3/19 Pt without sig knee pain , ankle pain resolved   ROS:  Denies CP, SOB, abd pain, N/V/D/C, or any other complaints at this time.    Objective:   DG Knee 1-2 Views Right Result Date: 01/25/2023 CLINICAL DATA:  Postoperative state.  Evaluate hardware. EXAM: RIGHT KNEE - 1-2 VIEW COMPARISON:  Right knee radiographs 01/11/2023 FINDINGS: Redemonstration of total right knee arthroplasty. No perihardware lucency is seen to indicate hardware failure or loosening. Resolution of the prior intra-articular and subcutaneous postoperative air. No significant joint effusion, with decrease in the prior joint fluid. No acute fracture or dislocation. Mild anterior knee soft tissue swelling is similar to prior. Mild atherosclerotic calcifications. IMPRESSION: Total right knee arthroplasty without evidence of hardware failure. Electronically Signed   By: Bertina Broccoli M.D.   On: 01/25/2023 11:42    Recent Labs    01/25/23 0455  WBC 8.0  HGB 11.6*  HCT 35.2*  PLT 352    Recent Labs    01/25/23 0455  NA 136  K 4.2  CL 105  CO2 23  GLUCOSE 106*  BUN 16  CREATININE 0.93  CALCIUM  9.0       Intake/Output Summary (Last 24 hours) at 01/27/2023 0746 Last data filed at 01/26/2023 1800 Gross per 24 hour  Intake 474 ml  Output --  Net 474 ml         Physical Exam: Vital Signs Blood pressure 108/77, pulse 64, temperature (!) 97.5 F (36.4 C), temperature source Oral, resp. rate 18, height 6\' 1"  (1.854 m), weight 102.2 kg, SpO2 94%.   General: No acute distress, laying in bed.  Mood and affect are appropriate Heart: Regular rate and rhythm no rubs murmurs or extra sounds Lungs: Clear to auscultation, breathing unlabored, no rales or wheezes Abdomen: Positive bowel sounds, soft nontender to palpation, nondistended Extremities: RLE  swelling in the knee area, no thigh tenderness on R side, trace edema at the pretibial area-stable Skin: No evidence of breakdown, no evidence of rash, CDI dermabond Right knee   PRIOR EXAMS: Neurological:     Mental Status: He is alert and oriented to person, place, and time.     Comments: Pt is alert, oriented to person, place, month/year. Mild anomia Mild right hemiparesis RUE: 4+/5, RLE 3- HF, KE, 4/5 ankle DF (confounded by RTKA). LUE and LLE  5/5. No focal sensory loss.  . Normal resting tone. MSK- R knee flexion at 90 deg, lacks 30deg knee ext RIght ankle - + pitting edema but no tenderness to palpation , no joint swelling    Assessment/Plan: 1. Functional deficits due to Left CVA Stable for D/C today F/u PCP in 1-2 weeks F/u Cards Dr Librado Reef 03/31/23 F/u PM&R 2-3 weeks F/u Ortho Dr Josefa Ni 74mo F/u Neuro - parkinson's Dr Tat See D/C summary See D/C instructions  Care Tool:  Bathing    Body parts bathed by patient: Right arm, Left arm, Chest, Abdomen, Front perineal area, Buttocks, Right upper leg, Face, Left lower leg, Right lower leg, Left upper leg   Body parts  bathed by helper: Buttocks, Right lower leg, Left lower leg     Bathing assist Assist Level: Independent with assistive device     Upper Body Dressing/Undressing Upper body dressing   What is the patient wearing?: Pull over shirt    Upper body assist Assist Level: Independent    Lower Body Dressing/Undressing Lower body dressing      What is the patient wearing?: Underwear/pull up, Pants     Lower body assist Assist for lower body dressing: Set up assist     Toileting Toileting Toileting Activity did not occur (Clothing management and hygiene only): N/A (no void or bm)  Toileting assist Assist for toileting: Independent with assistive device     Transfers Chair/bed transfer  Transfers assist     Chair/bed transfer assist level: Independent with assistive device      Locomotion Ambulation   Ambulation assist      Assist level: Independent with assistive device Assistive device: Walker-rolling Max distance: 350 ft   Walk 10 feet activity   Assist     Assist level: Independent with assistive device Assistive device: No Device   Walk 50 feet activity   Assist    Assist level: Independent with assistive device Assistive device: Walker-rolling    Walk 150 feet activity   Assist    Assist level: Independent with assistive device Assistive device: Walker-rolling    Walk 10 feet on uneven surface  activity   Assist Walk 10 feet on uneven surfaces activity did not occur: Safety/medical concerns   Assist level: Supervision/Verbal cueing Assistive device: Walker-rolling   Wheelchair     Assist Is the patient using a wheelchair?: No (Refused on discharge assessment d/t wish to ambulate for TKA) Type of Wheelchair: Manual Wheelchair activity did not occur: Refused  Wheelchair assist level: Dependent - Patient 0% Max wheelchair distance: 200 ft    Wheelchair 50 feet with 2 turns activity    Assist    Wheelchair 50 feet with 2 turns activity did not occur: Refused   Assist Level: Dependent - Patient 0%   Wheelchair 150 feet activity     Assist  Wheelchair 150 feet activity did not occur: Refused   Assist Level: Dependent - Patient 0%   Blood pressure 108/77, pulse 64, temperature (!) 97.5 F (36.4 C), temperature source Oral, resp. rate 18, height 6\' 1"  (1.854 m), weight 102.2 kg, SpO2 94%.  Medical Problem List and Plan: 1. 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           D/c today   2.  Antithrombotics: -DVT/anticoagulation:  Mechanical: Sequential compression devices, below knee Right lower extremity in addition to below.  -LE dopplers 01/13/23 were clear. -antiplatelet  therapy: Aspirin  and Brilinta  for 30 days followed by aspirin  alone. Last dose of Brilinta : 02/11/2023.   3. Pain Management: Tylenol , Robaxin , oxycodone  as needed 5mg  taking ~2tab per day , may get 14 tab for d/c, RF per ortho    -ankle pain resolved   4. Mood/Behavior/Sleep: LCSW to evaluate and provide emotional support             -continue Ambien  10 mg q HS             -continue Lexapro  20 daily             -antipsychotic agents: n/a   5. Neuropsych/cognition: This patient is capable of making decisions  on his own behalf.   6. Skin/Wound Care: Routine skin care checks             -maintain Aquacel surgical dressing   7. Fluids/Electrolytes/Nutrition: Routine Is and Os and follow-up chemistries   8: Hypertension: monitor TID and prn (see BPH meds below); also intermittent Hypotension, see #19             -continue Toprol  12.5 mg q HS             -1/4-5/25 BPs great, cont regimen  -1/11-12/25 BPs doing well, monitor Vitals:   01/23/23 1500 01/23/23 1932 01/24/23 0324 01/24/23 1342  BP: 118/75 121/72 109/71 123/71   01/24/23 1951 01/25/23 0425 01/25/23 1419 01/25/23 1937  BP: 128/74 121/85 116/72 122/71   01/26/23 0510 01/26/23 1327 01/26/23 1837 01/27/23 0454  BP: 124/73 122/71 123/71 108/77     9: Hyperlipidemia: continue rosuvastatin  20mg  daily   10: BPH: continue Flomax  0.4mg  daily and Proscar  5mg  daily   11: Parkinson disease, possibly ET/PD:  -Sinemet  25/100, 2 tablets at 7 AM/2 tablets at 10 AM/2 at 1pm/2 at 4pm/2 at 7pm and 50/200 at bedtime -continue ropinirole  1 mg TID for rigidity -follows with Dr. Winferd Hatter   12: Allergic rhinitis: continue Zaditor  gtts, Astelin  spray, Flonase , Claritin  10mg  daily   13: Asthma: continue Singulair  10mg  daily   14: History of CP, NSTEMI, CAD, RBBB and high burden of PVCs s/p Link monitor in past -continue mexiletine 150 mg BID -Plavix  discontinued and now on asa and Brilinta  -continue Crestor  20 mg daily -follows with Dr. Renna Cary    15: GERD/Barrett's esophagus: Protonix  40mg  daily   16: Primary OA of right knee s/p TKA 01/11/2023 (previous left TKA)             -maintain zero degree knee bone foam 10-15 minutes TID             -range of motion in bed/with therapy CPM Comp hose  Appreciate ortho note , f/u next week wound check  17: Mild ABLA: follow-up CBC      Latest Ref Rng & Units 01/25/2023    4:55 AM 01/18/2023    5:27 AM 01/16/2023    6:12 AM  CBC  WBC 4.0 - 10.5 K/uL 8.0  7.9  8.2   Hemoglobin 13.0 - 17.0 g/dL 29.5  28.4  13.2   Hematocrit 39.0 - 52.0 % 35.2  36.8  37.6   Platelets 150 - 400 K/uL 352  275  252       18: Anti-constipation: continue colace 100 mg BID; prns ordered  Improved    19: History of chronic intermittent hypotension:             -continue midodrine  5 mg BID for systolic BP < 100     LOS: 12 days A FACE TO FACE EVALUATION WAS PERFORMED  Genetta Kenning 01/27/2023, 7:46 AM

## 2023-01-27 NOTE — Progress Notes (Signed)
 Physical Therapy Discharge Summary  Patient Details  Name: Joshua [name] MRN: [id number] Date of Birth: 1945/11/26  Date of Discharge from PT service:January 26, 2023  Today's Date: 01/26/2023   Patient has met 9 of 9 long term goals due to improved activity tolerance, improved balance, increased strength, increased range of motion, decreased pain, and functional use of  right upper extremity and right lower extremity.  Patient to discharge at an ambulatory level Modified Independent.   Patient's care partner is independent to provide the necessary physical assistance at discharge.  Reasons goals not met: n/a  Recommendation:  Patient will benefit from ongoing skilled PT services in outpatient setting to continue to advance safe functional mobility, address ongoing impairments in strength, ROM, coordination, balance, activity tolerance, cognition, safety awareness, and to minimize fall risk.  Equipment: No equipment provided  Reasons for discharge: treatment goals met and discharge from hospital  Patient/family agrees with progress made and goals achieved: Yes  PT Discharge Precautions/Restrictions Precautions Precautions: Knee;Fall Precaution Comments: R TKA (need to rest in extension), R hemi, Expressive aphasia, CPM to R knee at least once daily Restrictions Weight Bearing Restrictions Per Provider Order: Yes RLE Weight Bearing Per Provider Order: Weight bearing as tolerated Vital Signs Therapy Vitals Temp: (!) 97.5 F (36.4 C) Temp Source: Oral Pulse Rate: 64 BP: 108/77 Oxygen  Therapy SpO2: 94 % Pain Pain Assessment Pain Scale: 0-10 Pain Score: 6  Pain Type: Surgical pain Pain Location: Knee Pain Orientation: Right Pain Descriptors / Indicators: Operative site guarding;Aching Pain Onset: On-going Pain Intervention(s): Repositioned Pain Interference Pain Interference Pain Effect on Sleep: 1. Rarely or not at all;2. Occasionally Pain Interference with  Therapy Activities: 3. Frequently;2. Occasionally Pain Interference with Day-to-Day Activities: 2. Occasionally;3. Frequently Vision/Perception  Vision - History Ability to See in Adequate Light: 1 Impaired Vision - Assessment Eye Alignment: Within Functional Limits Ocular Range of Motion: Within Functional Limits Perception Perception: Within Functional Limits Praxis Praxis: WFL  Cognition Overall Cognitive Status: Within Functional Limits for tasks assessed Arousal/Alertness: Awake/alert Orientation Level: Oriented X4 Memory: Impaired Awareness: Impaired Executive Function: Self Monitoring Self Monitoring: Impaired Safety/Judgment: Appears intact Sensation Sensation Light Touch: Appears Intact Peripheral sensation comments: RUE numbness (reports present for ~3 months PTA), reports numbness/tingling in BLE. Light Touch Impaired Details: Impaired RUE Coordination Gross Motor Movements are Fluid and Coordinated: No Fine Motor Movements are Fluid and Coordinated: No Heel Shin Test: slow to perform with LLE; very slow with RLE d/t s/p R TKA and R hemi Motor  Motor Motor - Discharge Observations: mild R hemipareisis, pt has made excellent progress with R hemibody strength  Mobility Bed Mobility Bed Mobility: Rolling Left;Rolling Right;Supine to Sit;Sit to Supine Rolling Right: Independent with assistive device Rolling Left: Independent with assistive device Supine to Sit: Independent with assistive device Sit to Supine: Independent with assistive device Transfers Transfers: Sit to Stand;Stand to Sit;Stand Pivot Transfers Sit to Stand: Independent with assistive device Stand to Sit: Independent with assistive device Stand Pivot Transfers: Independent with assistive device Transfer (Assistive device): Rolling walker Locomotion  Gait Ambulation: Yes Gait Assistance: Independent with assistive device Gait Distance (Feet): 350 Feet Assistive device: Rolling  walker Gait Gait: Yes Gait Pattern: Impaired Gait Pattern: Antalgic;Decreased dorsiflexion - right;Decreased hip/knee flexion - right;Step-through pattern (decreased hip/ knee flexion during swing phase however holds Bil knees in flexion during stance.) Gait velocity: reduced High Level Ambulation High Level Ambulation: Side stepping;Backwards walking;Sudden stops Stairs / Additional Locomotion Stairs: Yes Stairs Assistance: Supervision/Verbal cueing Stair Management  Technique: Two rails Number of Stairs: 12 Height of Stairs: 6 Pick up small object from the floor assist level: Independent with assistive device Wheelchair Mobility Wheelchair Mobility: No  Trunk/Postural Assessment  Cervical Assessment Cervical Assessment: Exceptions to [facility] (forward head) Thoracic Assessment Thoracic Assessment: Exceptions to [facility] (rounded shoulders with mild kyphosis) Lumbar Assessment Lumbar Assessment: Exceptions to [facility] (postierior pelvic tilt) Postural Control Postural Control: Within Functional Limits  Balance Balance Balance Assessed: Yes Static Sitting Balance Static Sitting - Level of Assistance: 7: Independent Dynamic Sitting Balance Dynamic Sitting - Balance Support: Feet supported;Bilateral upper extremity supported;During functional activity Dynamic Sitting - Level of Assistance: 6: Modified independent (Device/Increase time);7: Independent Static Standing Balance Static Standing - Level of Assistance: 6: Modified independent (Device/Increase time) Dynamic Standing Balance Dynamic Standing - Level of Assistance: 6: Modified independent (Device/Increase time) Extremity Assessment      RLE Assessment RLE Assessment: Exceptions to [facility] RLE AROM (degrees) Right Knee Extension: 8 Right Knee Flexion: 123 RLE PROM (degrees) Right Knee Extension: 6 Right Knee Flexion: 126 RLE Strength Right Hip Flexion: 4+/5 Right Hip Extension: 4/5 Right Hip ABduction: 4+/5 Right Hip ADduction:  4/5 Right Knee Flexion: 4-/5 Right Knee Extension: 4/5 Right Ankle Dorsiflexion: 5/5 Right Ankle Plantar Flexion: 5/5 LLE Assessment LLE Assessment: Within Functional Limits General Strength Comments: 5/5 grossly   Mliss DELENA Milliner PT, DPT, CSRS 01/26/2023, 7:42 PM

## 2023-02-02 NOTE — Progress Notes (Deleted)
Assessment/Plan:   1.  Parkinsons Disease, possibly ET/PD  -Continue carbidopa/levodopa 25/100,2 tablets at 7 AM/2 tablets at 10 AM/2 at 1pm/2 at 4pm/2 at 7pm  -Continue carbidopa/levodopa 50/200 at bedtime  -Continue requip 1 mg tid.  Patient is markedly improved in terms of decreased rigidity on this medication.  -May repeat levodopa challenge in the future, especially if we consider surgical interventions.  He and I discussed this in detail today.  We did this several years ago, but at that point in time he only had tremor (and he has levodopa resistant tremor).  He has since developed more bradykinesia and rigidity.  -Patient had skin biopsy done for alpha-synuclein in January, 2024 which did demonstrate evidence of alpha-synuclein in all 3 sites.    -Patient stated that his orthopedic surgeon told him to ask about DBS surgery, stating that it would potentially help his balance.  Discussed DBS surgery in detail with the patient.  Told him that it would not help balance and is not a treatment for balance.  It certainly could help rigidity and tremor, but today he is not rigid on medication.  I do not recommend it right now for him given the degree of tremor that he has.  He does not disagree.     2.  History of watershed infarct.  Years later, the patient had another infarct in the left precentral/middle frontal gyrus in December, 2025.  -First infarct was watershed infarct and had a [place] recorder, but had it explanted after it never demonstrated any issues  -Second infarct in December, 2025 occurred during knee replacement surgery and patient was off of the Plavix for the surgery.  -Patient back on Plavix  -Patient's LDL was only 23  3.  History of multiple syncopal episodes, likely due to [facility]  -Off of propranolol and no further syncope.  cardiology has added metoprolol ever since his NSTEMI.    -continue midodrine 5 mg bid.  He is generally only taking it once per day and feels that he  is doing okay.  Told him to make sure he is checking it before surgery.  4.  Low back pain  -Has had surgical interventions in the past.  Has followed with Dr. Murray Hodgkins and Dr. Wynetta Emery  5.  NSTEMI, 01/2020  -He was started on metoprolol at that time, although his Imdur was stopped because of hypotension.  6.  Constipation  -Patient is following with gastroenterology, Dr. Marina Goodell.  He also has gastroenterology at [facility] because of his history of Barrett's esophagus, now status post endoscopic ablation.  7.  Knee pain  -discussed surgical implications.  It is planned to do it under spinal/epidural per form sent me today.    -Told him to take Parkinson's medicines the morning of surgery.  -He is optimized from a Parkinson's standpoint.  His forms were filled out today. Subjective:   Joshua [name] was seen today in follow up for hospital f/u.  Much has happened since our last visit.  Patient went to the hospital for right knee replacement December 30.  Unfortunately, patient woke up from anesthesia with right-sided weakness.  MRI brain demonstrated acute infarct in the left precentral and middle frontal gyrus.  Patient was seen by Dr. Pearlean Brownie.  Patient was obviously off of the Plavix for the knee replacement.  His LDL was only 23.  His left ventricular ejection fraction was 50 to 55%.  CTA of the head and neck demonstrated no significant stenosis of the carotids.  There is mild to moderate left vertebral artery stenosis.  Patient subsequently went to inpatient rehab, primarily for the expressive aphasia and mild right hemiparesis.  They have arranged for a 30-day event monitor.  Current prescribed movement disorder medications: Carbidopa/levodopa 25/100,2 tablets at 7 AM/2 tablets at 10 AM/2 at 1pm/2 at 4pm/2 at 7pm (an increase) Carbidopa/levodopa 50/200 at bedtime Requip 1 mg tid (started last visit) Midodrine 5 mg bid (he has only been taking it once per day but states that it doesn't run low at  home)   PREVIOUS MEDICATIONS: Pramipexole (just was not helpful for tremor); primidone; propranolol; metoprolol  ALLERGIES:   Allergies  Allergen Reactions   Floxin I.V. In Dextrose 5% [Ofloxacin] Other (See Comments)    Lowers BP    Terazosin Other (See Comments)    Lower bp    CURRENT MEDICATIONS:  Outpatient Encounter Medications as of 02/03/2023  Medication Sig   acetaminophen (TYLENOL) 325 MG tablet Take 1-2 tablets (325-650 mg total) by mouth every 4 (four) hours as needed for mild pain (pain score 1-3).   albuterol (VENTOLIN HFA) 108 (90 Base) MCG/ACT inhaler Inhale 2 puffs into the lungs every 4 (four) hours as needed for wheezing or shortness of breath.   aspirin EC 81 MG tablet Take 1 tablet (81 mg total) by mouth daily. Swallow whole.   azelastine (OPTIVAR) 0.05 % ophthalmic solution Place 1 drop into both eyes 2 (two) times daily.   Biotin 16109 MCG TABS Take 1,000 mcg by mouth in the morning.   carbidopa-levodopa (SINEMET CR) 50-200 MG tablet TAKE 1 TABLET BY MOUTH EVERYDAY AT BEDTIME   carbidopa-levodopa (SINEMET IR) 25-100 MG tablet 2 TABLETS AT 7 AM, 2 TABLETS AT 10 AM,2 TABLETS AT 1PM,2 TABLETS AT 4 PM /2 TABLETS AT 7 PM   cholecalciferol (VITAMIN D3) 25 MCG (1000 UNIT) tablet Take 1,000 Units by mouth daily.   cyanocobalamin (VITAMIN B12) 1000 MCG tablet Take 1,000 mcg by mouth daily.   docusate sodium (COLACE) 100 MG capsule Take 2 capsules (200 mg total) by mouth 2 (two) times daily as needed for mild constipation.   DYMISTA 137-50 MCG/ACT SUSP Place 1 puff into both nostrils at bedtime.    escitalopram (LEXAPRO) 20 MG tablet Take 20 mg by mouth daily.   finasteride (PROSCAR) 5 MG tablet Take 5 mg by mouth every evening.   lansoprazole (PREVACID) 30 MG capsule Take 30 mg by mouth 2 (two) times daily. Morning & mid-afternoon   levocetirizine (XYZAL) 5 MG tablet Take 5 mg by mouth every evening.     Menthol, Topical Analgesic, (BIOFREEZE EX) Apply 1 Application  topically daily as needed (pain).   metoprolol succinate (TOPROL-XL) 25 MG 24 hr tablet TAKE 1/2 TABLET BY MOUTH DAILY (Patient taking differently: Take 12.5 mg by mouth at bedtime.)   mexiletine (MEXITIL) 150 MG capsule TAKE 1 CAPSULE BY MOUTH TWICE A DAY   midodrine (PROAMATINE) 5 MG tablet TAKE 1 TABLET BY MOUTH 2 TIMES DAILY AS NEEDED. TAKE IF SYSTOLIC BLOOD PRESSURE GETS BELOW 100 (Patient taking differently: Take 5 mg by mouth See admin instructions. Take 5 mg daily, may take a second 5 mg dose as needed for low bp)   montelukast (SINGULAIR) 10 MG tablet Take 10 mg by mouth at bedtime.     nitroGLYCERIN (NITROSTAT) 0.4 MG SL tablet Place 1 tablet (0.4 mg total) under the tongue every 5 (five) minutes as needed for chest pain (Do not give more than 3 SL tablets in  15 minutes.).   Omega-3 Fatty Acids (FISH OIL) 1000 MG CAPS Take 1,000 mg by mouth in the morning.   oxyCODONE (OXY IR/ROXICODONE) 5 MG immediate release tablet Take 1-2 tablets (5-10 mg total) by mouth every 4 (four) hours as needed for severe pain (pain score 7-10) (pain score 4-6).   polyethylene glycol (MIRALAX) 17 g packet Take 17 g by mouth daily. (Patient taking differently: Take 17 g by mouth daily as needed for moderate constipation.)   PROCTOSOL HC 2.5 % rectal cream APPLY INTO AND AROUND RECTUM 2 TIMES A DAY (Patient taking differently: Place 1 Application rectally 2 (two) times daily as needed for hemorrhoids or anal itching.)   rOPINIRole (REQUIP) 1 MG tablet TAKE 1 TABLET BY MOUTH 3 TIMES DAILY.   rosuvastatin (CRESTOR) 20 MG tablet Take 1 tablet (20 mg total) by mouth at bedtime.   tamsulosin (FLOMAX) 0.4 MG CAPS capsule Take 0.4 mg by mouth daily after supper.    ticagrelor (BRILINTA) 90 MG TABS tablet Take 1 tablet (90 mg total) by mouth 2 (two) times daily. Last doses on 02/11/2023   zolpidem (AMBIEN) 10 MG tablet Take 10 mg by mouth at bedtime.   No facility-administered encounter medications on file as of 02/03/2023.     Objective:   PHYSICAL EXAMINATION:    VITALS:   There were no vitals filed for this visit.    GEN:  The patient appears stated age and is in NAD. HEENT:  Normocephalic, atraumatic.  The mucous membranes are moist. The superficial temporal arteries are without ropiness or tenderness. CV:  RRR Lungs:  CTAB Neck/HEME:  There are no carotid bruits bilaterally.  Neurological examination:  Orientation: The patient is alert and oriented x3. Cranial nerves: There is good facial symmetry with facial hypomimia. The speech is fluent and clear. Soft palate rises symmetrically and there is no tongue deviation. Hearing is intact to conversational tone. Sensation: Sensation is intact to light touch throughout Motor: Strength is at least antigravity x4.  Movement examination: Tone: There is nl tone in the UE/LE (marked improvement) Abnormal movements: there is R>LUE rest tremor, overally mild Coordination:  There is no decremation, with any form of RAMS, including alternating supination and pronation of the forearm, hand opening and closing, finger taps, heel taps and toe taps bilaterally (improved) Gait and Station: The patient pushes off to arise.  The patient is forward flexed with decreased arm swing and he slightly drags the R leg.  He is a bit short stepped.  He is slightly antalgic.  I have reviewed and interpreted the following labs independently    Chemistry      Component Value Date/Time   NA 136 01/25/2023 0455   NA 136 02/14/2021 1528   K 4.2 01/25/2023 0455   CL 105 01/25/2023 0455   CO2 23 01/25/2023 0455   BUN 16 01/25/2023 0455   BUN 20 02/14/2021 1528   CREATININE 0.93 01/25/2023 0455   CREATININE 0.88 09/18/2015 1135      Component Value Date/Time   CALCIUM 9.0 01/25/2023 0455   ALKPHOS 51 01/16/2023 0612   AST 16 01/16/2023 0612   ALT <5 01/16/2023 0612   BILITOT 1.7 (H) 01/16/2023 0612   BILITOT 0.7 04/04/2015 1501       Lab Results  Component Value  Date   WBC 8.0 01/25/2023   HGB 11.6 (L) 01/25/2023   HCT 35.2 (L) 01/25/2023   MCV 92.4 01/25/2023   PLT 352 01/25/2023    Lab  Results  Component Value Date   TSH 0.893 05/26/2022     Total time spent on today's visit was *** minutes, including both face-to-face time and nonface-to-face time.  Time included that spent on review of records (prior notes available to me/labs/imaging if pertinent), discussing treatment and goals, answering patient's questions and coordinating care.  Cc:  [name], MD

## 2023-02-03 ENCOUNTER — Ambulatory Visit: Payer: Medicare HMO | Admitting: Neurology

## 2023-02-03 ENCOUNTER — Telehealth: Payer: Self-pay | Admitting: Cardiology

## 2023-02-03 NOTE — Telephone Encounter (Signed)
Patient scheduled to bring monitor to [facility] office at 1:00PM, 02/04/23 to have a tutorial session and have monitor applied.

## 2023-02-03 NOTE — Telephone Encounter (Signed)
Pt received Heart monitor but is not sure about putting it on himself

## 2023-02-04 ENCOUNTER — Ambulatory Visit: Payer: Medicare HMO | Attending: Cardiology

## 2023-02-04 DIAGNOSIS — I493 Ventricular premature depolarization: Secondary | ICD-10-CM | POA: Diagnosis not present

## 2023-02-04 DIAGNOSIS — I4510 Unspecified right bundle-branch block: Secondary | ICD-10-CM

## 2023-02-04 DIAGNOSIS — I63512 Cerebral infarction due to unspecified occlusion or stenosis of left middle cerebral artery: Secondary | ICD-10-CM

## 2023-02-04 NOTE — Progress Notes (Unsigned)
[id number] mailed to patient from [facility]. Applied in office.  Dr. Anne Fu to read.

## 2023-02-08 ENCOUNTER — Encounter: Payer: Medicare HMO | Attending: Registered Nurse | Admitting: Registered Nurse

## 2023-02-08 ENCOUNTER — Encounter: Payer: Self-pay | Admitting: Registered Nurse

## 2023-02-08 VITALS — BP 96/58 | HR 84 | Ht 73.0 in | Wt 228.2 lb

## 2023-02-08 DIAGNOSIS — I639 Cerebral infarction, unspecified: Secondary | ICD-10-CM

## 2023-02-08 DIAGNOSIS — I10 Essential (primary) hypertension: Secondary | ICD-10-CM

## 2023-02-08 DIAGNOSIS — M1711 Unilateral primary osteoarthritis, right knee: Secondary | ICD-10-CM

## 2023-02-08 NOTE — Progress Notes (Signed)
Subjective:    Patient ID: Joshua [name], male    DOB: 06/27/1945, 78 y.o.   MRN: [id number]  HPI: Joshua [name] is a 78 y.o. male who  is here for HFU follow up of his  CVA ( Cerebrovascular Accident), Essential Hypertension and Primary Osteoarthritis of Right Knee. He underwent TKA on 01/11/2023, he developed acute onset of right sided weakness and aphasia after surgery.   CT Head WO Contrast:  IMPRESSION: 1. No evidence of acute intracranial abnormality. ASPECTS is 10. 2. Chronic microvascular ischemic change.  CTA:  IMPRESSION: 1. No large vessel occlusion. 2. Mild-to-moderate left vertebral artery origin stenosis. 3. Mild to moderate cervical carotid atherosclerosis without a significant stenosis. 4.  Aortic Atherosclerosis (ICD10-I70.0).  MR: Brain:  IMPRESSION: Acute infarct of the left precentral gyrus and middle frontal gyrus with petechial hemorrhage at the infarct site. No mass effect or midline shift. Neurology Consulted: Joshua [name] was on Plavix prior to admission, he was started on aspirin and Brilinta for 4 weeks followed by aspirin alone.   Joshua [name] was admitted to inpatient Rehabilitation on 01/15/2023 and discharged home on 01/27/2023. He is receiving Home Health Therapy from [facility]. He denies any pain at this time. On his Health History form he rated his pin 6. Also reports he has a good appetite.    Pain Inventory Average Pain 6 Pain Right Now 6 My pain is  knee replacement in December   LOCATION OF PAIN  right knee  BOWEL Number of stools per week: 7 Oral laxative use Yes  Type of laxative miralax  BLADDER Normal  Mobility use a walker ability to climb steps?  yes  Function I need assistance with the following:  dressing, meal prep, household duties, and shopping  Neuro/Psych tremor trouble walking spasms  Prior Studies Any changes since last visit?  no  Physicians involved in your care Any changes since  last visit?  yes   Family History  Problem Relation Age of Onset  . Hypertension Mother   . Heart attack Father   . Heart disease Father   . Clotting disorder Father   . Heart disease Brother   . Lung cancer Brother   . Prostate cancer Brother   . Breast cancer Paternal Grandmother   . Heart attack Paternal Grandfather   . Healthy Daughter   . Healthy Daughter   . Esophageal cancer Neg Hx   . Stomach cancer Neg Hx   . Rectal cancer Neg Hx   . Colon polyps Neg Hx    Social History   Socioeconomic History  . Marital status: Married    Spouse name: Joshua [name]   . Number of children: 2  . Years of education: 60  . Highest education level: Some college, no degree  Occupational History  . Occupation: Retired    Comment: Social research officer, government  Tobacco Use  . Smoking status: Never  . Smokeless tobacco: Never  Vaping Use  . Vaping status: Never Used  Substance and Sexual Activity  . Alcohol use: No    Alcohol/week: 0.0 standard drinks of alcohol  . Drug use: No  . Sexual activity: Not Currently  Other Topics Concern  . Not on file  Social History Narrative   Lives with wife.    Caffeine use: Coffee/tea/soda- ocassionally   Right-handed.   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 Worried About Programme researcher, broadcasting/film/video in the Last Year: Patient unable to answer   . Ran Out of Food in the Last Year: Patient unable to answer  Transportation Needs: Patient Unable To Answer (01/11/2023)   PRAPARE - Transportation   . Lack of Transportation (Medical): Patient unable to answer   . Lack of Transportation (Non-Medical): Patient unable to answer  Physical Activity: Not on file  Stress: Not on file  Social Connections: Unknown (01/11/2023)   Social Connection and Isolation Panel [NHANES]   . Frequency of Communication with Friends and Family: Patient  unable to answer   . Frequency of Social Gatherings with Friends and Family: Patient unable to answer   . Attends Religious Services: Not on file   . Active Member of Clubs or Organizations: Patient unable to answer   . Attends Banker Meetings: Patient unable to answer   . Marital Status: Patient unable to answer   Past Surgical History:  Procedure Laterality Date  . BACK SURGERY  2002,2009   x 6  . CHOLECYSTECTOMY  11/30/2012   with IOC  . COLONOSCOPY    . ELECTROPHYSIOLOGIC STUDY N/A 09/26/2015   Procedure: V Tach Ablation (PVC);  Surgeon: Will Jorja Loa, MD;  Location: [facility] INVASIVE CV LAB;  Service: Cardiovascular;  Laterality: N/A;  . EP IMPLANTABLE DEVICE N/A 04/04/2015   Procedure: Loop Recorder Insertion;  Surgeon: Will Jorja Loa, MD;  Location: [facility] INVASIVE CV LAB;  Service: Cardiovascular;  Laterality: N/A;  . HERNIA REPAIR     laprascopic  . KNEE SURGERY     DECEMBER 2017,LEFT KNEE SCOPED  . LEFT HEART CATH AND CORONARY ANGIOGRAPHY N/A 02/05/2021   Procedure: LEFT HEART CATH AND CORONARY ANGIOGRAPHY;  Surgeon: Iran Ouch, MD;  Location: [facility] INVASIVE CV LAB;  Service: Cardiovascular;  Laterality: N/A;  . NECK SURGERY  2002  . POLYPECTOMY    . ROTATOR CUFF REPAIR Left   . TEE WITHOUT CARDIOVERSION N/A 04/04/2015   Procedure: TRANSESOPHAGEAL ECHOCARDIOGRAM (TEE);  Surgeon: Lewayne Bunting, MD;  Location: [facility] ENDOSCOPY;  Service: Cardiovascular;  Laterality: N/A;  . TOTAL KNEE ARTHROPLASTY Left 09/28/2022   Procedure: TOTAL KNEE ARTHROPLASTY;  Surgeon: Joen Laura, MD;  Location: [facility] ORS;  Service: Orthopedics;  Laterality: Left;  . TOTAL KNEE ARTHROPLASTY Right 01/11/2023   Procedure: TOTAL KNEE ARTHROPLASTY;  Surgeon: Joen Laura, MD;  Location: [facility] ORS;  Service: Orthopedics;  Laterality: Right;  . TRIGGER FINGER RELEASE Left 12/28/2019   Procedure: RELEASE TRIGGER FINGER/A-1 PULLEY THUMB, MIDDLE AND RING;  Surgeon: Cindee Salt,  MD;  Location: [facility];  Service: Orthopedics;  Laterality: Left;  FAB  . UPPER GASTROINTESTINAL ENDOSCOPY    . V Tach ablation  09/26/2015   Past Medical History:  Diagnosis Date  . Allergic rhinitis   . Anxiety    from chronic pain from surgery- on Cymbalta  . Arthritis   . Asthma   . Barrett's esophagus 03/29/2014  . Carotid artery disease (HCC)    Carotid Doppler normal August, 2007  . Coronary artery disease   . Diverticulosis   . Dyslipidemia   . GERD (gastroesophageal reflux disease)   . History of loop recorder    has since 04/04/15  . HTN (hypertension)    takes Metoprolol for PVC control  . Hx of colonic polyps    adenomatous  . IFG (impaired fasting glucose)   .  Myocardial infarction (HCC)    mild  . Neuromuscular disorder (HCC)    parkinson's tremors in hands  . Palpitations    Benign PVCs  . Parkinson disease (HCC)   . Pneumonia   . Prostate cancer (HCC)   . RBBB (right bundle branch block)    rate related  . Shingles   . Stroke (HCC) 2017  . TIA (transient ischemic attack) 02/2015   Per pt, had 2 strokes  . Vertigo    BP (!) 96/58   Pulse 84   Ht 6\' 1"  (1.854 m)   Wt 228 lb 3.2 oz (103.5 kg)   SpO2 92%   BMI 30.11 kg/m   Opioid Risk Score:   Fall Risk Score:  `1  Depression screen [facility] 2/9     02/08/2023   11:15 AM 04/15/2021    3:02 PM  Depression screen PHQ 2/9  Decreased Interest 0 0  Down, Depressed, Hopeless 0 0  PHQ - 2 Score 0 0  Altered sleeping 0 0  Tired, decreased energy 1 0  Change in appetite 0 0  Feeling bad or failure about yourself  0 0  Trouble concentrating 0 0  Moving slowly or fidgety/restless 0 0  Suicidal thoughts 0 0  PHQ-9 Score 1 0  Difficult doing work/chores Not difficult at all Not difficult at all    Review of Systems  Musculoskeletal:  Positive for gait problem.  Neurological:  Positive for tremors.  All other systems reviewed and are negative.  Has seen all his physicians post  hospitalization .    Objective:   Physical Exam Vitals and nursing note reviewed.  Constitutional:      Appearance: Normal appearance.  Cardiovascular:     Rate and Rhythm: Normal rate and regular rhythm.     Pulses: Normal pulses.     Heart sounds: Normal heart sounds.  Pulmonary:     Effort: Pulmonary effort is normal.     Breath sounds: Normal breath sounds.  Musculoskeletal:     Comments: Normal Muscle Bulk and Muscle Testing Reveals:  Upper Extremities: Full ROM and Muscle Strength on Right 4/5 and Left 5/5 Wearing Right Wrist Splint   Lower Extremities: Full ROM and Muscle Strength 5/5 Arises from Chair with walker for support Narrow based  Gait     Skin:    General: Skin is warm and dry.  Neurological:     Mental Status: He is alert and oriented to person, place, and time.  Psychiatric:        Mood and Affect: Mood normal.        Behavior: Behavior normal.         Assessment & Plan:  CVA ( Cerebrovascular Accident),: He has a scheduled appointment with Dr Tat. On 03/10/2023, continue current medication regimen. Continue to Monitor. Continue Home Health Therapy.   Essential Hypertension: Continue Current medication regimen. Continue to Monitor.   Primary Osteoarthritis of Right Knee. S/P : Dr. Blanchie Dessert on 01/11/2024.  TOTAL KNEE ARTHROPLASTY Right Spinal   F/U with Dr Wynn Banker in 4- 6 weeks

## 2023-02-10 ENCOUNTER — Other Ambulatory Visit: Payer: Self-pay | Admitting: Neurology

## 2023-02-11 ENCOUNTER — Encounter: Payer: Self-pay | Admitting: Neurology

## 2023-02-18 ENCOUNTER — Telehealth: Payer: Self-pay | Admitting: Cardiology

## 2023-02-18 NOTE — Telephone Encounter (Signed)
  1. Is this related to a heart monitor you are wearing?  (If the patient says no, please ask     if they are caling about ICD/pacemaker.) yes  2. What is your issue?? Needs new strips/adhesive? Requesting cb  Please route to covering RN/CMA/RMA for results. Route to monitor technicians or your monitor tech representative for your site for any technical concerns

## 2023-02-18 NOTE — Telephone Encounter (Signed)
 Looks patient patient may have event monitor.

## 2023-02-18 NOTE — Telephone Encounter (Signed)
 I will leave 6 extra [employer] strips at the front desk, [employer]. Office. Patient aware.

## 2023-02-24 ENCOUNTER — Other Ambulatory Visit: Payer: Self-pay

## 2023-02-26 NOTE — Progress Notes (Signed)
Assessment/Plan:   1.  Parkinsons Disease, possibly ET/PD  -Continue carbidopa/levodopa 25/100,2 tablets at 7 AM/2 tablets at 10 AM/2 at 1pm/2 at 4pm/2 at 7pm  -Continue carbidopa/levodopa 50/200 at bedtime  -Continue requip 1 mg tid.  Patient is markedly improved in terms of decreased rigidity on this medication.  -May repeat levodopa challenge in the future, especially if we consider surgical interventions.   We did this several years ago, but at that point in time he only had tremor (and he has levodopa resistant tremor).  He has since developed more bradykinesia and rigidity.  -Patient had skin biopsy done for alpha-synuclein in January, 2024 which did demonstrate evidence of alpha-synuclein in all 3 sites.      2.  History of watershed infarct.  Years later, the patient had another infarct in the left precentral/middle frontal gyrus in December, 2025.  -First infarct was watershed infarct and had a [place] recorder, but had it explanted after it never demonstrated any issues  -Second infarct in December, 2025 occurred during knee replacement surgery and patient was off of the Plavix for the surgery.  -Patient on Brilinta now.  He finishes that middle of next week and then plan from the hospital was to go back to aspirin monotherapy.  That worries him, as he had a stroke in the past on aspirin and he would like to go back to his Plavix monotherapy.  I think that is reasonable.  I gave him a prescription to refill that, but told him not to take that until he was done with the Brilinta.    -Patient's LDL was only 43  -RX OT  3.  History of multiple syncopal episodes, likely due to [facility]  -Off of propranolol and no further syncope.  cardiology has added metoprolol ever since his NSTEMI.    -continue midodrine 5 mg bid.  He is generally only taking it once per day and feels that he is doing okay.    4.  Low back pain  -Has had surgical interventions in the past.  Has followed with Dr. Murray Hodgkins  and Dr. Wynetta Emery  5.  NSTEMI, 01/2020  -He was started on metoprolol at that time, although his Imdur was stopped because of hypotension.  6.  Constipation  -Patient is following with gastroenterology, Dr. Marina Goodell.  He also has gastroenterology at [facility] because of his history of Barrett's esophagus, now status post endoscopic ablation.  7.  Possible radial nerve palsy  -He is having trouble extending the wrist and fingers and has numbness in the back of the hand on the thumb pointer and middle finger.  He was told that this was from carpal tunnel, but I am not so convinced.  We are going to schedule EMG. Subjective:   Joshua [name] was seen today in follow up for hospital f/u.  Much has happened since our last visit.  Patient went to the hospital for right knee replacement December 30.  Unfortunately, patient woke up from anesthesia with right-sided weakness.  MRI brain demonstrated acute infarct in the left precentral and middle frontal gyrus.  Patient was seen by Dr. Pearlean Brownie.  Patient was obviously off of the Plavix for the knee replacement.  His LDL was only 23.  His left ventricular ejection fraction was 50 to 55%.  CTA of the head and neck demonstrated no significant stenosis of the carotids.  There is mild to moderate left vertebral artery stenosis.  Patient subsequently went to inpatient rehab, primarily for the  expressive aphasia and mild right hemiparesis.  They have arranged for a 30-day event monitor.  He has been released from outpt ST and is done with outpt PT.  They want more OT.  Patient reports that he is currently on aspirin and Brilinta, but he is going to be stopping the Brilinta soon and was told to go back to aspirin monotherapy.  That worries him, as he reports that he had a stroke in the past on aspirin monotherapy and he wants to go back to Plavix monotherapy.  Patient does state that he has been having a tough time using his right hand, and he states that this really proceeded the  stroke.  His wife was not so sure, but the patient states that he was having a hard time extending the wrist and fingers prior to the stroke.  He states that the back part of the thumb, pointer and middle finger are numb.  Current prescribed movement disorder medications: Carbidopa/levodopa 25/100,2 tablets at 7 AM/2 tablets at 10 AM/2 at 1pm/2 at 4pm/2 at 7pm (an increase) Carbidopa/levodopa 50/200 at bedtime Requip 1 mg tid (started last visit) Midodrine 5 mg bid (he has only been taking it once per day but states that it doesn't run low at home)   PREVIOUS MEDICATIONS: Pramipexole (just was not helpful for tremor); primidone; propranolol; metoprolol  ALLERGIES:   Allergies  Allergen Reactions   Floxin I.V. In Dextrose 5% [Ofloxacin] Other (See Comments)    Lowers BP    Terazosin Other (See Comments)    Lower bp    CURRENT MEDICATIONS:  Outpatient Encounter Medications as of 03/01/2023  Medication Sig   acetaminophen (TYLENOL) 325 MG tablet Take 1-2 tablets (325-650 mg total) by mouth every 4 (four) hours as needed for mild pain (pain score 1-3).   albuterol (VENTOLIN HFA) 108 (90 Base) MCG/ACT inhaler Inhale 2 puffs into the lungs every 4 (four) hours as needed for wheezing or shortness of breath.   aspirin EC 81 MG tablet Take 1 tablet (81 mg total) by mouth daily. Swallow whole.   azelastine (OPTIVAR) 0.05 % ophthalmic solution Place 1 drop into both eyes 2 (two) times daily.   Biotin 16109 MCG TABS Take 1,000 mcg by mouth in the morning.   carbidopa-levodopa (SINEMET CR) 50-200 MG tablet TAKE 1 TABLET BY MOUTH EVERYDAY AT BEDTIME   carbidopa-levodopa (SINEMET IR) 25-100 MG tablet 2 TABLETS AT 7 AM, 2 TABLETS AT 10 AM,2 TABLETS AT 1PM,2 TABLETS AT 4 PM /2 TABLETS AT 7 PM   cholecalciferol (VITAMIN D3) 25 MCG (1000 UNIT) tablet Take 1,000 Units by mouth daily.   cyanocobalamin (VITAMIN B12) 1000 MCG tablet Take 1,000 mcg by mouth daily.   docusate sodium (COLACE) 100 MG capsule  Take 2 capsules (200 mg total) by mouth 2 (two) times daily as needed for mild constipation.   DYMISTA 137-50 MCG/ACT SUSP Place 1 puff into both nostrils at bedtime.    escitalopram (LEXAPRO) 20 MG tablet Take 20 mg by mouth daily.   finasteride (PROSCAR) 5 MG tablet Take 5 mg by mouth every evening.   lansoprazole (PREVACID) 30 MG capsule Take 30 mg by mouth 2 (two) times daily. Morning & mid-afternoon   levocetirizine (XYZAL) 5 MG tablet Take 5 mg by mouth every evening.     Menthol, Topical Analgesic, (BIOFREEZE EX) Apply 1 Application topically daily as needed (pain).   metoprolol succinate (TOPROL-XL) 25 MG 24 hr tablet TAKE 1/2 TABLET BY MOUTH DAILY (Patient taking differently:  Take 12.5 mg by mouth at bedtime.)   mexiletine (MEXITIL) 150 MG capsule TAKE 1 CAPSULE BY MOUTH TWICE A DAY   midodrine (PROAMATINE) 5 MG tablet TAKE 1 TABLET BY MOUTH 2 TIMES DAILY AS NEEDED. TAKE IF SYSTOLIC BLOOD PRESSURE GETS BELOW 100 (Patient taking differently: Take 5 mg by mouth See admin instructions. Take 5 mg daily, may take a second 5 mg dose as needed for low bp)   montelukast (SINGULAIR) 10 MG tablet Take 10 mg by mouth at bedtime.     nitroGLYCERIN (NITROSTAT) 0.4 MG SL tablet Place 1 tablet (0.4 mg total) under the tongue every 5 (five) minutes as needed for chest pain (Do not give more than 3 SL tablets in 15 minutes.).   Omega-3 Fatty Acids (FISH OIL) 1000 MG CAPS Take 1,000 mg by mouth in the morning.   oxyCODONE (OXY IR/ROXICODONE) 5 MG immediate release tablet Take 1-2 tablets (5-10 mg total) by mouth every 4 (four) hours as needed for severe pain (pain score 7-10) (pain score 4-6).   polyethylene glycol (MIRALAX) 17 g packet Take 17 g by mouth daily. (Patient taking differently: Take 17 g by mouth daily as needed for moderate constipation.)   PROCTOSOL HC 2.5 % rectal cream APPLY INTO AND AROUND RECTUM 2 TIMES A DAY (Patient taking differently: Place 1 Application rectally 2 (two) times daily as  needed for hemorrhoids or anal itching.)   rOPINIRole (REQUIP) 1 MG tablet TAKE 1 TABLET BY MOUTH THREE TIMES A DAY   rosuvastatin (CRESTOR) 20 MG tablet Take 1 tablet (20 mg total) by mouth at bedtime.   tamsulosin (FLOMAX) 0.4 MG CAPS capsule Take 0.4 mg by mouth daily after supper.    ticagrelor (BRILINTA) 90 MG TABS tablet Take 1 tablet (90 mg total) by mouth 2 (two) times daily. Last doses on 02/11/2023   zolpidem (AMBIEN) 10 MG tablet Take 10 mg by mouth at bedtime.   No facility-administered encounter medications on file as of 03/01/2023.    Objective:   PHYSICAL EXAMINATION:    VITALS:   Vitals:   03/01/23 1410  BP: 124/80  Pulse: 96  SpO2: 96%  Weight: 229 lb (103.9 kg)      GEN:  The patient appears stated age and is in NAD. HEENT:  Normocephalic, atraumatic.  The mucous membranes are moist. The superficial temporal arteries are without ropiness or tenderness. CV:  RRR Lungs:  CTAB Neck/HEME:  There are no carotid bruits bilaterally.  Neurological examination:  Orientation: The patient is alert and oriented x3. Cranial nerves: There is good facial symmetry with facial hypomimia. The speech is fluent and clear.  I was not able to detect any dysarthria today (patient states markedly better) and there was no significant dysphasia or paraphasic errors.  Soft palate rises symmetrically and there is no tongue deviation. Hearing is intact to conversational tone. Sensation: Sensation is intact to light touch throughout Motor: Strength is 5/5 in the biceps/deltoid/brachio on the right.  He has difficulty extending the wrist.  He has difficulty extending the fingers on the right.  He does have good grip strength on the right.  He has good ability to flex the wrist.  Strength in the left upper extremity is 5/5 throughout, including in the wrist, fingers, hands.  Strength in the bilateral lower extremities is 5/5.  Movement examination: Tone: There is nl tone in the UE/LE   Abnormal movements: there is rare rest tremor on the right today.  None seen on the  left today. Coordination:  There is no decremation, with any form of RAMS, including alternating supination and pronation of the forearm, hand opening and closing, finger taps, heel taps and toe taps bilaterally  Gait and Station: The patient pushes off to arise.  The patient is forward flexed with decreased arm swing and he slightly drags the R leg.  He is a bit short stepped.  He is slightly antalgic.  This is stable from prior.  I have reviewed and interpreted the following labs independently    Chemistry      Component Value Date/Time   NA 136 01/25/2023 0455   NA 136 02/14/2021 1528   K 4.2 01/25/2023 0455   CL 105 01/25/2023 0455   CO2 23 01/25/2023 0455   BUN 16 01/25/2023 0455   BUN 20 02/14/2021 1528   CREATININE 0.93 01/25/2023 0455   CREATININE 0.88 09/18/2015 1135      Component Value Date/Time   CALCIUM 9.0 01/25/2023 0455   ALKPHOS 51 01/16/2023 0612   AST 16 01/16/2023 0612   ALT <5 01/16/2023 0612   BILITOT 1.7 (H) 01/16/2023 0612   BILITOT 0.7 04/04/2015 1501       Lab Results  Component Value Date   WBC 8.0 01/25/2023   HGB 11.6 (L) 01/25/2023   HCT 35.2 (L) 01/25/2023   MCV 92.4 01/25/2023   PLT 352 01/25/2023    Lab Results  Component Value Date   TSH 0.893 05/26/2022     Total time spent on today's visit was 43 minutes, including both face-to-face time and nonface-to-face time.  Time included that spent on review of records (prior notes available to me/labs/imaging if pertinent), discussing treatment and goals, answering patient's questions and coordinating care.  Cc:  [name], MD

## 2023-03-01 ENCOUNTER — Ambulatory Visit (INDEPENDENT_AMBULATORY_CARE_PROVIDER_SITE_OTHER): Payer: Medicare HMO | Admitting: Neurology

## 2023-03-01 VITALS — BP 124/80 | HR 96 | Wt 229.0 lb

## 2023-03-01 DIAGNOSIS — G5631 Lesion of radial nerve, right upper limb: Secondary | ICD-10-CM | POA: Diagnosis not present

## 2023-03-01 DIAGNOSIS — G20A1 Parkinson's disease without dyskinesia, without mention of fluctuations: Secondary | ICD-10-CM | POA: Diagnosis not present

## 2023-03-01 DIAGNOSIS — I6389 Other cerebral infarction: Secondary | ICD-10-CM

## 2023-03-01 MED ORDER — CLOPIDOGREL BISULFATE 75 MG PO TABS: 75.0000 mg | ORAL_TABLET | Freq: Every day | ORAL | 11 refills | Status: AC

## 2023-03-01 MED ORDER — CARBIDOPA-LEVODOPA 25-100 MG PO TABS: ORAL_TABLET | ORAL | 1 refills | Status: DC

## 2023-03-01 MED ORDER — CARBIDOPA-LEVODOPA ER 50-200 MG PO TBCR: EXTENDED_RELEASE_TABLET | ORAL | 1 refills | Status: DC

## 2023-03-04 ENCOUNTER — Ambulatory Visit: Payer: Medicare HMO | Admitting: Neurology

## 2023-03-09 ENCOUNTER — Other Ambulatory Visit: Payer: Self-pay | Admitting: Neurology

## 2023-03-09 ENCOUNTER — Telehealth: Payer: Self-pay | Admitting: Neurology

## 2023-03-09 ENCOUNTER — Encounter: Payer: Self-pay | Admitting: Cardiology

## 2023-03-09 ENCOUNTER — Ambulatory Visit: Payer: Medicare HMO | Attending: Neurology | Admitting: Occupational Therapy

## 2023-03-09 DIAGNOSIS — M6281 Muscle weakness (generalized): Secondary | ICD-10-CM | POA: Diagnosis present

## 2023-03-09 DIAGNOSIS — I6389 Other cerebral infarction: Secondary | ICD-10-CM

## 2023-03-09 DIAGNOSIS — G250 Essential tremor: Secondary | ICD-10-CM | POA: Diagnosis present

## 2023-03-09 DIAGNOSIS — G20B1 Parkinson's disease with dyskinesia, without mention of fluctuations: Secondary | ICD-10-CM | POA: Diagnosis present

## 2023-03-09 DIAGNOSIS — R29818 Other symptoms and signs involving the nervous system: Secondary | ICD-10-CM | POA: Insufficient documentation

## 2023-03-09 DIAGNOSIS — G20A1 Parkinson's disease without dyskinesia, without mention of fluctuations: Secondary | ICD-10-CM

## 2023-03-09 DIAGNOSIS — I6300 Cerebral infarction due to thrombosis of unspecified precerebral artery: Secondary | ICD-10-CM | POA: Diagnosis present

## 2023-03-09 DIAGNOSIS — R29898 Other symptoms and signs involving the musculoskeletal system: Secondary | ICD-10-CM | POA: Diagnosis present

## 2023-03-09 DIAGNOSIS — R278 Other lack of coordination: Secondary | ICD-10-CM | POA: Diagnosis present

## 2023-03-09 NOTE — Telephone Encounter (Signed)
 Pt. Calling has questions on driving and his restrictions and if he should not driving officially

## 2023-03-09 NOTE — Therapy (Unsigned)
 OUTPATIENT OCCUPATIONAL THERAPY NEURO EVALUATION  Patient Name: Joshua [name] MRN: [id number] DOB:[birth year], 78 y.o., male Today's Date: 03/09/2023  PCP: [name], MD  REFERRING PROVIDER: Arbutus Leas Octaviano Batty, DO  END OF SESSION:  OT End of Session - 03/09/23 1022     Visit Number 1    Number of Visits 13    Date for OT Re-Evaluation 04/23/23    Authorization Type [employer]    OT Start Time 1022    OT Stop Time 1100    OT Time Calculation (min) 38 min    Activity Tolerance Patient tolerated treatment well    Behavior During Therapy WFL for tasks assessed/performed             Past Medical History:  Diagnosis Date   Allergic rhinitis    Anxiety    from chronic pain from surgery- on Cymbalta   Arthritis    Asthma    Barrett's esophagus 03/29/2014   Carotid artery disease (HCC)    Carotid Doppler normal August, 2007   Coronary artery disease    Diverticulosis    Dyslipidemia    GERD (gastroesophageal reflux disease)    History of loop recorder    has since 04/04/15   HTN (hypertension)    takes Metoprolol for PVC control   Hx of colonic polyps    adenomatous   IFG (impaired fasting glucose)    Myocardial infarction (HCC)    mild   Neuromuscular disorder (HCC)    parkinson's tremors in hands   Palpitations    Benign PVCs   Parkinson disease (HCC)    Pneumonia    Prostate cancer (HCC)    RBBB (right bundle branch block)    rate related   Shingles    Stroke (HCC) 2017   TIA (transient ischemic attack) 02/2015   Per pt, had 2 strokes   Vertigo    Past Surgical History:  Procedure Laterality Date   BACK SURGERY  2002,2009   x 6   CHOLECYSTECTOMY  11/30/2012   with IOC   COLONOSCOPY     ELECTROPHYSIOLOGIC STUDY N/A 09/26/2015   Procedure: V Tach Ablation (PVC);  Surgeon: Will Jorja Loa, MD;  Location: [facility];  Service: Cardiovascular;  Laterality: N/A;   EP IMPLANTABLE DEVICE N/A 04/04/2015   Procedure: Loop  Recorder Insertion;  Surgeon: Will Jorja Loa, MD;  Location: [facility];  Service: Cardiovascular;  Laterality: N/A;   HERNIA REPAIR     laprascopic   KNEE SURGERY     DECEMBER 2017,LEFT KNEE SCOPED   LEFT HEART CATH AND CORONARY ANGIOGRAPHY N/A 02/05/2021   Procedure: LEFT HEART CATH AND CORONARY ANGIOGRAPHY;  Surgeon: Iran Ouch, MD;  Location: [facility];  Service: Cardiovascular;  Laterality: N/A;   NECK SURGERY  2002   POLYPECTOMY     ROTATOR CUFF REPAIR Left    TEE WITHOUT CARDIOVERSION N/A 04/04/2015   Procedure: TRANSESOPHAGEAL ECHOCARDIOGRAM (TEE);  Surgeon: Lewayne Bunting, MD;  Location: [facility];  Service: Cardiovascular;  Laterality: N/A;   TOTAL KNEE ARTHROPLASTY Left 09/28/2022   Procedure: TOTAL KNEE ARTHROPLASTY;  Surgeon: Joen Laura, MD;  Location: [facility];  Service: Orthopedics;  Laterality: Left;   TOTAL KNEE ARTHROPLASTY Right 01/11/2023   Procedure: TOTAL KNEE ARTHROPLASTY;  Surgeon: Joen Laura, MD;  Location: [facility];  Service: Orthopedics;  Laterality: Right;   TRIGGER FINGER RELEASE Left 12/28/2019   Procedure: RELEASE TRIGGER FINGER/A-1 PULLEY THUMB,  MIDDLE AND RING;  Surgeon: Cindee Salt, MD;  Location: [facility];  Service: Orthopedics;  Laterality: Left;  FAB   UPPER GASTROINTESTINAL ENDOSCOPY     V Tach ablation  09/26/2015   Patient Active Problem List   Diagnosis Date Noted   CVA (cerebral vascular accident) (HCC) 01/15/2023   Primary osteoarthritis of right knee 01/11/2023   Localized osteoarthritis of right knee 01/11/2023   Primary osteoarthritis of left knee 09/28/2022   SBO (small bowel obstruction) (HCC) 05/29/2022   Chest pain 05/26/2022   Epigastric abdominal pain 05/26/2022   Coronary artery disease involving native coronary artery of native heart without angina pectoris 05/28/2021   History of myocardial infarction 02/05/2021   Parkinson's disease (HCC)    Malignant neoplasm of  prostate (HCC) 11/01/2017   Essential tremor 12/06/2015   PVC (premature ventricular contraction) 09/26/2015   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) 04/07/2015   Acute CVA (cerebrovascular accident) (HCC) 03/06/2015   Stroke (cerebrum) (HCC) 03/05/2015   Cerebral infarction (HCC) 03/05/2015   Weakness of right upper extremity    Tremor    Ejection fraction    Vertigo    Sleep apnea    Palpitations    Hyperlipidemia with target LDL less than 70    GERD (gastroesophageal reflux disease)    HTN (hypertension)    Chest pain    RBBB (right bundle branch block)    Ejection fraction    Carotid artery disease (HCC)     ONSET DATE: 03/01/2023 (Date of referral); 01/11/2023 date of stroke and RTKA  REFERRING DIAG:  G20.A1 (ICD-10-CM) - Parkinson's disease without dyskinesia or fluctuating manifestations (HCC)  I63.89 (ICD-10-CM) - Cerebral infarction due to other mechanism    THERAPY DIAG:  No diagnosis found.  Rationale for Evaluation and Treatment: Rehabilitation  SUBJECTIVE:   SUBJECTIVE STATEMENT: He feels that his R tremor is worse following his CVA. He is receiving PT for his knees at an ortho clinic. Has not been using RUE as dominant hand since his stroke.   Pt accompanied by: self  PERTINENT HISTORY: PMH: PD, Anxiety, HTN, HLD, multiple back surgeries/fusion neck and lumbar spine,  NSTEMI, 01/2020, hx of multiple syncopal episodes   PRECAUTIONS: Fall  WEIGHT BEARING RESTRICTIONS: No  PAIN:  Are you having pain? Yes: NPRS scale: 3/10 Pain location: B knees Pain description: aching Aggravating factors: cold weather Relieving factors: rest, stretching, heat  FALLS: Has patient fallen in last 6 months? No  LIVING ENVIRONMENT: Lives with: lives with their spouse Lives in: House/apartment - Town Home Stairs: No Has following equipment at home: Single point cane, Environmental consultant - 4 wheeled, Grab bars, and transport chair Has a lift chair and a lift bed.    PLOF: Independent; Horticulturist, commercial; driving  PATIENT GOALS: reduce affects of tremor in RUE  OBJECTIVE:   HAND DOMINANCE: Right  ADLs: Overall ADLs: mod I Eating: setup Equipment: Shower seat with back, Grab bars, Walk in shower, Reacher, Sock aid, Long handled shoe horn, and Long handled sponge  IADLs: Shopping: able to do light shopping with shopping cart Light housekeeping: cleans bathrooms minus the tubs and uses light weight vacuum cleaners Meal Prep: Mod I Community mobility: driving Medication management: mod I Financial management: 50/50 with wife Handwriting: 25% legible  MOBILITY STATUS: Needs Assist: uses SPC and Hx of falls  ACTIVITY TOLERANCE: Activity tolerance: fair  FUNCTIONAL OUTCOME MEASURES: To be assessed during future visits  UPPER EXTREMITY ROM:     AROM  Right (eval) Left (eval)  Shoulder flexion WNL WNL  Shoulder abduction WNL WNL  Elbow flexion WNL WNL  Elbow extension WNL WNL  Wrist flexion BFL WNL  Wrist extension BFL WNL  Wrist pronation WFL WNL  Wrist supination WFL WNL   Digit Composite Flexion WFL WNL  Digit Composite Extension WNL WNL  Digit Opposition BFL WNL  (Blank rows = not tested)  UPPER EXTREMITY MMT:     MMT Right (eval) Left (eval)  Shoulder flexion [employer] WNL  Shoulder abduction WFL WNL  Elbow flexion WFL WNL  Elbow extension WFL WNL  (Blank rows = not tested)  HAND FUNCTION: Grip strength: Right: 20 lbs; Left:73 lbs  COORDINATION: 9 Hole Peg test: Right: unable to place any pegs in 60 sec (2 drops) - significant decline; Left: 44 sec - no change  Box and Blocks:  Right 22 blocks - significant decline, Left 37 blocks - no change Tremors: Right, Left, and right worse than left  Simulated Eating: Right - 20 seconds; Left (pt using this side since stroke, especially with high viscus foods) - 21 seconds   Buttons: 85 seconds - significant decline  SENSATION: Moderate to severe paresthesias in R hand  and fingers primarily but extends up to shoulder, worse since stroke  EDEMA: mild arthritic changes to R wrist  MUSCLE TONE: RUE: Rigidity; mild  COGNITION: Overall cognitive status: Within functional limits for tasks assessed  VISION: Subjective report: Just got new glasses RX; limited changes Baseline vision: Wears glasses all the time; progressive lenses Visual history: n/a  OBSERVATIONS: Bradykinesia   TODAY'S TREATMENT:                                                                                                                              N/A  PATIENT EDUCATION: Education details: Role of OT; POC Person educated: Patient Education method: Explanation Education comprehension: verbalized understanding  HOME EXERCISE PROGRAM: N/A  GOALS:  SHORT TERM GOALS: Target date: 04/06/2023     Pt will be independent with PD specific HEP.  Baseline: Goal status: INITIAL  2.  Pt will verbalize understanding of adapted strategies to maximize safety and I with ADLs/IADLs. Baseline:  Goal status: INITIAL  3.  Pt will verbalize understanding of ways to prevent future PD related complications and PD community resources.  Baseline:  Goal status: INITIAL  4.  Pt will report use of R as dominant hand with feeding and show improvement to PPT #2 score.  Baseline: using LUE for eating and completed test in 20 seconds with R Goal status: INITIAL   LONG TERM GOALS: Target date: 04/23/2023  Pt will demonstrate improved ease with fastening buttons as evidenced by decreasing 3 button/unbutton time by at least 16 seconds  Baseline: 86 seconds Goal status: INITIAL  2.  Patient will complete nine-hole peg with use of R in 120 seconds or less with no more than 1 drop. Baseline: Right: unable to place any  pegs in 60 sec (2 drops) Goal status: INITIAL  3.  Patient will demonstrate at least 31 lbs R grip strength as needed to open jars and other containers. Baseline: Right: 20  lbs Goal status: INITIAL  4.  Pt will be able to place at least 30 blocks using right hand with completion of Box and Blocks test. Baseline: Right 22 blocks Goal status: INITIAL  ASSESSMENT:  CLINICAL IMPRESSION: Patient is a 78 y.o. male who was seen today for occupational therapy evaluation for Parkinson's. Hx includes multiple back surgeries (x6), hernia repair, L knee surgery, L rotator cuff repair, L trigger finger release, MI, HLD, HTN, CAD, CVA, and essential tremor. Patient currently presents below baseline level of functioning demonstrating functional deficits and impairments as noted below. Pt would benefit from skilled OT services in the outpatient setting to work on impairments as noted below to help pt return to PLOF as able.    PERFORMANCE DEFICITS: in functional skills including ADLs, IADLs, coordination, strength, Fine motor control, Gross motor control, mobility, balance, and UE functional use.  IMPAIRMENTS: are limiting patient from ADLs, IADLs, and leisure.   COMORBIDITIES:  may have co-morbidities  that affects occupational performance. Patient will benefit from skilled OT to address above impairments and improve overall function.  MODIFICATION OR ASSISTANCE TO COMPLETE EVALUATION: Min-Moderate modification of tasks or assist with assess necessary to complete an evaluation.  OT OCCUPATIONAL PROFILE AND HISTORY: Detailed assessment: Review of records and additional review of physical, cognitive, psychosocial history related to current functional performance.  CLINICAL DECISION MAKING: Moderate - several treatment options, min-mod task modification necessary  REHAB POTENTIAL: Fair given diagnosis  EVALUATION COMPLEXITY: Moderate   PLAN:  OT FREQUENCY: 2x/week  OT DURATION: 6 weeks  PLANNED INTERVENTIONS: self care/ADL training, therapeutic exercise, therapeutic activity, neuromuscular re-education, manual therapy, passive range of motion, balance training,  functional mobility training, splinting, electrical stimulation, ultrasound, paraffin, fluidotherapy, moist heat, patient/family education, cognitive remediation/compensation, visual/perceptual remediation/compensation, energy conservation, coping strategies training, DME and/or AE instructions, and Re-evaluation  RECOMMENDED OTHER SERVICES: none at this time  CONSULTED AND AGREED WITH PLAN OF CARE: Patient  PLAN FOR NEXT SESSION: review/progress HEP   Delana Meyer, OT 02/02/2022, 10:14 AM

## 2023-03-10 ENCOUNTER — Ambulatory Visit: Payer: Medicare HMO | Admitting: Neurology

## 2023-03-12 ENCOUNTER — Other Ambulatory Visit: Payer: Self-pay

## 2023-03-12 DIAGNOSIS — G20A1 Parkinson's disease without dyskinesia, without mention of fluctuations: Secondary | ICD-10-CM

## 2023-03-12 DIAGNOSIS — R202 Paresthesia of skin: Secondary | ICD-10-CM

## 2023-03-15 ENCOUNTER — Encounter: Payer: Medicare HMO | Admitting: Neurology

## 2023-03-15 NOTE — Telephone Encounter (Signed)
 Eval was sent to [facility] on Friday 03/12/23

## 2023-03-16 ENCOUNTER — Encounter: Payer: Self-pay | Admitting: Physical Medicine & Rehabilitation

## 2023-03-16 ENCOUNTER — Encounter: Payer: Medicare HMO | Attending: Physical Medicine & Rehabilitation | Admitting: Physical Medicine & Rehabilitation

## 2023-03-16 VITALS — BP 133/72 | HR 80 | Ht 73.0 in | Wt 231.2 lb

## 2023-03-16 DIAGNOSIS — I69331 Monoplegia of upper limb following cerebral infarction affecting right dominant side: Secondary | ICD-10-CM | POA: Diagnosis not present

## 2023-03-16 DIAGNOSIS — I69398 Other sequelae of cerebral infarction: Secondary | ICD-10-CM | POA: Diagnosis not present

## 2023-03-16 DIAGNOSIS — R269 Unspecified abnormalities of gait and mobility: Secondary | ICD-10-CM | POA: Insufficient documentation

## 2023-03-16 DIAGNOSIS — I69320 Aphasia following cerebral infarction: Secondary | ICD-10-CM | POA: Insufficient documentation

## 2023-03-16 NOTE — Progress Notes (Signed)
 Subjective:    Patient ID: Joshua [name], male    DOB: August 09, 1945, 78 y.o.   MRN: [id number] Admit date: 01/15/2023 Discharge date: 01/27/2023 78 y.o. male with history of Parkinson disease, prior TIAs/CVA in 2017 and end-stage right knee osteoarthritis who underwent right TKA on 01/11/2023 by Dr. Blanchie Dessert at [facility]. He developed acute onset of right sided weakness and aphasia after surgery in PACU. 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 He was taking Plavix prior to admission and was started on aspirin and Brilinta for 4 weeks followed by aspirin alone. 2-d echo with EF ~50-55%. A1c = 5.9%. BLE venous duplex negative for DVT. Patient will need 30-day heart monitor at discharge to look for paroxysmal A-fib. Symptoms somewhat improved and he is tolerating CPM well. He was able to ambulate an increased distance of up to ~74 ft with a RW and minA 01/02.  HPI Still has some coordination issues with RUE (has problems with handwriting ) , balance as well as some word finding issues with speech  Amb with cane which he did prior to CVA Pt tried driving at home and did ok Pt not aware of driving restrictions from ortho Patient has some occasional blurring of the right eye which she states is since his stroke although he did see an eye doctor prior to his stroke as well. He is modified independent level with all self-care and mobility Since last visit no falls no significant pain problems no depression Continues on medications for his Parkinson's disease Pain Inventory Average Pain 3 Pain Right Now 3 My pain is aching  In the last 24 hours, has pain interfered with the following? General activity 5 Relation with others 5 Enjoyment of life 5 What TIME of day is your pain at its worst? morning , daytime, evening, and night Sleep (in general) Good  Pain is worse with: walking, bending, standing,  some activites, and work Pain improves with: rest and medication Relief from Meds: 7  Family History  Problem Relation Age of Onset   Hypertension Mother    Heart attack Father    Heart disease Father    Clotting disorder Father    Heart disease Brother    Lung cancer Brother    Prostate cancer Brother    Breast cancer Paternal Grandmother    Heart attack Paternal Grandfather    Healthy Daughter    Healthy Daughter    Esophageal cancer Neg Hx    Stomach cancer Neg Hx    Rectal cancer Neg Hx    Colon polyps Neg Hx    Social History   Socioeconomic History   Marital status: Married    Spouse name: [name]    Number of children: 2   Years of education: 15   Highest education level: Some college, no degree  Occupational History   Occupation: Retired    Comment: Social research officer, government  Tobacco Use   Smoking status: Never   Smokeless tobacco: Never  Vaping Use   Vaping status: Never Used  Substance and Sexual Activity   Alcohol use: No    Alcohol/week: 0.0 standard drinks of alcohol   Drug use: No   Sexual activity: Not Currently  Other Topics Concern   Not on file  Social History Narrative   Lives with wife.    Caffeine use: Coffee/tea/soda- ocassionally   Right-handed.   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  Social Drivers of Corporate investment banker Strain: Not on file  Food Insecurity: Patient Unable To Answer (01/11/2023)   Hunger Vital Sign    Worried About Running Out of Food in the Last Year: Patient unable to answer    Ran Out of Food in the Last Year: Patient unable to answer  Transportation Needs: Patient Unable To Answer (01/11/2023)   PRAPARE - Transportation    Lack of Transportation (Medical): Patient unable to answer    Lack of Transportation (Non-Medical): Patient unable to answer  Physical Activity: Not on file  Stress: Not on file  Social Connections: Unknown (01/11/2023)   Social Connection and Isolation Panel [NHANES]    Frequency of  Communication with Friends and Family: Patient unable to answer    Frequency of Social Gatherings with Friends and Family: Patient unable to answer    Attends Religious Services: Not on file    Active Member of Clubs or Organizations: Patient unable to answer    Attends Banker Meetings: Patient unable to answer    Marital Status: Patient unable to answer   Past Surgical History:  Procedure Laterality Date   BACK SURGERY  2002,2009   x 6   CHOLECYSTECTOMY  11/30/2012   with IOC   COLONOSCOPY     ELECTROPHYSIOLOGIC STUDY N/A 09/26/2015   Procedure: V Tach Ablation (PVC);  Surgeon: Will Jorja Loa, MD;  Location: [facility];  Service: Cardiovascular;  Laterality: N/A;   EP IMPLANTABLE DEVICE N/A 04/04/2015   Procedure: Loop Recorder Insertion;  Surgeon: Will Jorja Loa, MD;  Location: [facility];  Service: Cardiovascular;  Laterality: N/A;   HERNIA REPAIR     laprascopic   KNEE SURGERY     DECEMBER 2017,LEFT KNEE SCOPED   LEFT HEART CATH AND CORONARY ANGIOGRAPHY N/A 02/05/2021   Procedure: LEFT HEART CATH AND CORONARY ANGIOGRAPHY;  Surgeon: Iran Ouch, MD;  Location: [facility];  Service: Cardiovascular;  Laterality: N/A;   NECK SURGERY  2002   POLYPECTOMY     ROTATOR CUFF REPAIR Left    TEE WITHOUT CARDIOVERSION N/A 04/04/2015   Procedure: TRANSESOPHAGEAL ECHOCARDIOGRAM (TEE);  Surgeon: Lewayne Bunting, MD;  Location: [facility];  Service: Cardiovascular;  Laterality: N/A;   TOTAL KNEE ARTHROPLASTY Left 09/28/2022   Procedure: TOTAL KNEE ARTHROPLASTY;  Surgeon: Joen Laura, MD;  Location: [facility];  Service: Orthopedics;  Laterality: Left;   TOTAL KNEE ARTHROPLASTY Right 01/11/2023   Procedure: TOTAL KNEE ARTHROPLASTY;  Surgeon: Joen Laura, MD;  Location: [facility];  Service: Orthopedics;  Laterality: Right;   TRIGGER FINGER RELEASE Left 12/28/2019   Procedure: RELEASE TRIGGER FINGER/A-1 PULLEY THUMB, MIDDLE AND  RING;  Surgeon: Cindee Salt, MD;  Location: [facility];  Service: Orthopedics;  Laterality: Left;  FAB   UPPER GASTROINTESTINAL ENDOSCOPY     V Tach ablation  09/26/2015   Past Surgical History:  Procedure Laterality Date   BACK SURGERY  2002,2009   x 6   CHOLECYSTECTOMY  11/30/2012   with IOC   COLONOSCOPY     ELECTROPHYSIOLOGIC STUDY N/A 09/26/2015   Procedure: V Tach Ablation (PVC);  Surgeon: Will Jorja Loa, MD;  Location: [facility];  Service: Cardiovascular;  Laterality: N/A;   EP IMPLANTABLE DEVICE N/A 04/04/2015   Procedure: Loop Recorder Insertion;  Surgeon: Will Jorja Loa, MD;  Location: [facility];  Service: Cardiovascular;  Laterality: N/A;   HERNIA REPAIR  laprascopic   KNEE SURGERY     DECEMBER 2017,LEFT KNEE SCOPED   LEFT HEART CATH AND CORONARY ANGIOGRAPHY N/A 02/05/2021   Procedure: LEFT HEART CATH AND CORONARY ANGIOGRAPHY;  Surgeon: Iran Ouch, MD;  Location: [facility];  Service: Cardiovascular;  Laterality: N/A;   NECK SURGERY  2002   POLYPECTOMY     ROTATOR CUFF REPAIR Left    TEE WITHOUT CARDIOVERSION N/A 04/04/2015   Procedure: TRANSESOPHAGEAL ECHOCARDIOGRAM (TEE);  Surgeon: Lewayne Bunting, MD;  Location: [facility];  Service: Cardiovascular;  Laterality: N/A;   TOTAL KNEE ARTHROPLASTY Left 09/28/2022   Procedure: TOTAL KNEE ARTHROPLASTY;  Surgeon: Joen Laura, MD;  Location: [facility];  Service: Orthopedics;  Laterality: Left;   TOTAL KNEE ARTHROPLASTY Right 01/11/2023   Procedure: TOTAL KNEE ARTHROPLASTY;  Surgeon: Joen Laura, MD;  Location: [facility];  Service: Orthopedics;  Laterality: Right;   TRIGGER FINGER RELEASE Left 12/28/2019   Procedure: RELEASE TRIGGER FINGER/A-1 PULLEY THUMB, MIDDLE AND RING;  Surgeon: Cindee Salt, MD;  Location: [facility];  Service: Orthopedics;  Laterality: Left;  FAB   UPPER GASTROINTESTINAL ENDOSCOPY     V Tach ablation  09/26/2015    Past Medical History:  Diagnosis Date   Allergic rhinitis    Anxiety    from chronic pain from surgery- on Cymbalta   Arthritis    Asthma    Barrett's esophagus 03/29/2014   Carotid artery disease (HCC)    Carotid Doppler normal August, 2007   Coronary artery disease    Diverticulosis    Dyslipidemia    GERD (gastroesophageal reflux disease)    History of loop recorder    has since 04/04/15   HTN (hypertension)    takes Metoprolol for PVC control   Hx of colonic polyps    adenomatous   IFG (impaired fasting glucose)    Myocardial infarction (HCC)    mild   Neuromuscular disorder (HCC)    parkinson's tremors in hands   Palpitations    Benign PVCs   Parkinson disease (HCC)    Pneumonia    Prostate cancer (HCC)    RBBB (right bundle branch block)    rate related   Shingles    Stroke (HCC) 2017   TIA (transient ischemic attack) 02/2015   Per pt, had 2 strokes   Vertigo    BP 133/72   Pulse 80   Ht 6\' 1"  (1.854 m)   Wt 231 lb 3.2 oz (104.9 kg)   SpO2 94%   BMI 30.50 kg/m   Opioid Risk Score:   Fall Risk Score:  `1  Depression screen [facility] 2/9     03/16/2023   10:56 AM 02/08/2023   11:15 AM 04/15/2021    3:02 PM  Depression screen PHQ 2/9  Decreased Interest 0 0 0  Down, Depressed, Hopeless 0 0 0  PHQ - 2 Score 0 0 0  Altered sleeping  0 0  Tired, decreased energy  1 0  Change in appetite  0 0  Feeling bad or failure about yourself   0 0  Trouble concentrating  0 0  Moving slowly or fidgety/restless  0 0  Suicidal thoughts  0 0  PHQ-9 Score  1 0  Difficult doing work/chores  Not difficult at all Not difficult at all     Review of Systems  All other systems reviewed and are negative.      Objective:   Physical Exam  There is  a mild bilateral upper extremity resting tremor supination pronation Extremities without edema Visual fields are intact confrontation testing Motor strength is 5/5 in the left deltoid, bicep, tricep, grip, hip flexor, knee  extensor, ankle dorsiflexion plantarflexion 4/5 in the right deltoid, bicep, tricep, grip 5/5 in the right hip flexor knee extensor ankle dorsiflexor Ambulates without assistive device no evidence toe drag or knee instability he has a wide-based gait with short step length and reduced arm swing Speech occasional word finding deficits, anomia No significant dysarthria  Reduced fine motor right finger thumb opposition sensation intact right upper extremity    Assessment & Plan:  1.  History left MCA distribution infarct status post right TKR please had a good recovery thus far he still has some fine motor deficits in the right upper extremity but otherwise with good strength his sensation appears intact We discussed his wish to return to driving.  I think from a stroke standpoint he would be fine but needs to get clearance from orthopedics in regards to his knee is approximately 2 months postop I do not think he needs any further physical medicine rehab follow-up.  He is back to essentially modified independent level he will finish out his PT OT speech as an outpatient.  Follow-up with neurology as well as primary care

## 2023-03-16 NOTE — Patient Instructions (Signed)
 Please check with Dr Blanchie Dessert in terms of driving restriction  If ok to resume per Dr Judie Petit  Graduated return to driving instructions were provided. It is recommended that the patient first drives with another licensed driver in an empty parking lot. If the patient does well with this, and they can drive on a quiet street with the licensed driver. If the patient does well with this they can drive on a busy street with a licensed driver. If the patient does well with this, the next time out they can go by himself. For the first month after resuming driving, I recommend no nighttime or Interstate driving.

## 2023-03-17 ENCOUNTER — Ambulatory Visit: Payer: Medicare HMO | Admitting: Occupational Therapy

## 2023-03-22 ENCOUNTER — Ambulatory Visit: Payer: Medicare HMO | Attending: Neurology | Admitting: Occupational Therapy

## 2023-03-22 DIAGNOSIS — R278 Other lack of coordination: Secondary | ICD-10-CM | POA: Diagnosis present

## 2023-03-22 DIAGNOSIS — R29818 Other symptoms and signs involving the nervous system: Secondary | ICD-10-CM | POA: Diagnosis present

## 2023-03-22 DIAGNOSIS — G20B1 Parkinson's disease with dyskinesia, without mention of fluctuations: Secondary | ICD-10-CM | POA: Diagnosis present

## 2023-03-22 DIAGNOSIS — M6281 Muscle weakness (generalized): Secondary | ICD-10-CM | POA: Insufficient documentation

## 2023-03-22 DIAGNOSIS — I6300 Cerebral infarction due to thrombosis of unspecified precerebral artery: Secondary | ICD-10-CM | POA: Diagnosis present

## 2023-03-22 DIAGNOSIS — R293 Abnormal posture: Secondary | ICD-10-CM | POA: Diagnosis present

## 2023-03-22 DIAGNOSIS — F801 Expressive language disorder: Secondary | ICD-10-CM | POA: Insufficient documentation

## 2023-03-22 DIAGNOSIS — R29898 Other symptoms and signs involving the musculoskeletal system: Secondary | ICD-10-CM | POA: Insufficient documentation

## 2023-03-22 DIAGNOSIS — G250 Essential tremor: Secondary | ICD-10-CM | POA: Insufficient documentation

## 2023-03-22 DIAGNOSIS — R471 Dysarthria and anarthria: Secondary | ICD-10-CM | POA: Insufficient documentation

## 2023-03-22 DIAGNOSIS — R2681 Unsteadiness on feet: Secondary | ICD-10-CM | POA: Diagnosis present

## 2023-03-22 NOTE — Therapy (Unsigned)
 OUTPATIENT OCCUPATIONAL THERAPY NEURO TREATMENT  Patient Name: Joshua [name] MRN: [id number] DOB:[birth year], 78 y.o., male Today's Date: 03/22/2023  PCP: [name], MD  REFERRING PROVIDER: Arbutus Leas Octaviano Batty, DO  END OF SESSION:  OT End of Session - 03/22/23 1009     Visit Number 2    Number of Visits 13    Date for OT Re-Evaluation 04/23/23    Authorization Type [employer]    OT Start Time 1012    OT Stop Time 1058    OT Time Calculation (min) 46 min    Activity Tolerance Patient tolerated treatment well    Behavior During Therapy [facility] for tasks assessed/performed             Past Medical History:  Diagnosis Date   Allergic rhinitis    Anxiety    from chronic pain from surgery- on Cymbalta   Arthritis    Asthma    Barrett's esophagus 03/29/2014   Carotid artery disease (HCC)    Carotid Doppler normal August, 2007   Coronary artery disease    Diverticulosis    Dyslipidemia    GERD (gastroesophageal reflux disease)    History of loop recorder    has since 04/04/15   HTN (hypertension)    takes Metoprolol for PVC control   Hx of colonic polyps    adenomatous   IFG (impaired fasting glucose)    Myocardial infarction (HCC)    mild   Neuromuscular disorder (HCC)    parkinson's tremors in hands   Palpitations    Benign PVCs   Parkinson disease (HCC)    Pneumonia    Prostate cancer (HCC)    RBBB (right bundle branch block)    rate related   Shingles    Stroke (HCC) 2017   TIA (transient ischemic attack) 02/2015   Per pt, had 2 strokes   Vertigo    Past Surgical History:  Procedure Laterality Date   BACK SURGERY  2002,2009   x 6   CHOLECYSTECTOMY  11/30/2012   with IOC   COLONOSCOPY     ELECTROPHYSIOLOGIC STUDY N/A 09/26/2015   Procedure: V Tach Ablation (PVC);  Surgeon: Will Jorja Loa, MD;  Location: [facility];  Service: Cardiovascular;  Laterality: N/A;   EP IMPLANTABLE DEVICE N/A 04/04/2015   Procedure: Loop  Recorder Insertion;  Surgeon: Will Jorja Loa, MD;  Location: [facility];  Service: Cardiovascular;  Laterality: N/A;   HERNIA REPAIR     laprascopic   KNEE SURGERY     DECEMBER 2017,LEFT KNEE SCOPED   LEFT HEART CATH AND CORONARY ANGIOGRAPHY N/A 02/05/2021   Procedure: LEFT HEART CATH AND CORONARY ANGIOGRAPHY;  Surgeon: Iran Ouch, MD;  Location: [facility];  Service: Cardiovascular;  Laterality: N/A;   NECK SURGERY  2002   POLYPECTOMY     ROTATOR CUFF REPAIR Left    TEE WITHOUT CARDIOVERSION N/A 04/04/2015   Procedure: TRANSESOPHAGEAL ECHOCARDIOGRAM (TEE);  Surgeon: Lewayne Bunting, MD;  Location: [facility];  Service: Cardiovascular;  Laterality: N/A;   TOTAL KNEE ARTHROPLASTY Left 09/28/2022   Procedure: TOTAL KNEE ARTHROPLASTY;  Surgeon: Joen Laura, MD;  Location: [facility];  Service: Orthopedics;  Laterality: Left;   TOTAL KNEE ARTHROPLASTY Right 01/11/2023   Procedure: TOTAL KNEE ARTHROPLASTY;  Surgeon: Joen Laura, MD;  Location: [facility];  Service: Orthopedics;  Laterality: Right;   TRIGGER FINGER RELEASE Left 12/28/2019   Procedure: RELEASE TRIGGER FINGER/A-1 PULLEY THUMB,  MIDDLE AND RING;  Surgeon: Cindee Salt, MD;  Location: [facility];  Service: Orthopedics;  Laterality: Left;  FAB   UPPER GASTROINTESTINAL ENDOSCOPY     V Tach ablation  09/26/2015   Patient Active Problem List   Diagnosis Date Noted   CVA (cerebral vascular accident) (HCC) 01/15/2023   Primary osteoarthritis of right knee 01/11/2023   Localized osteoarthritis of right knee 01/11/2023   Primary osteoarthritis of left knee 09/28/2022   SBO (small bowel obstruction) (HCC) 05/29/2022   Chest pain 05/26/2022   Epigastric abdominal pain 05/26/2022   Coronary artery disease involving native coronary artery of native heart without angina pectoris 05/28/2021   History of myocardial infarction 02/05/2021   Parkinson's disease (HCC)    Malignant neoplasm of  prostate (HCC) 11/01/2017   Essential tremor 12/06/2015   PVC (premature ventricular contraction) 09/26/2015   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) 04/07/2015   Acute CVA (cerebrovascular accident) (HCC) 03/06/2015   Stroke (cerebrum) (HCC) 03/05/2015   Cerebral infarction (HCC) 03/05/2015   Weakness of right upper extremity    Tremor    Ejection fraction    Vertigo    Sleep apnea    Palpitations    Hyperlipidemia with target LDL less than 70    GERD (gastroesophageal reflux disease)    HTN (hypertension)    Chest pain    RBBB (right bundle branch block)    Ejection fraction    Carotid artery disease (HCC)     ONSET DATE: 03/01/2023 (Date of referral); 01/11/2023 date of stroke and RTKA  REFERRING DIAG:  G20.A1 (ICD-10-CM) - Parkinson's disease without dyskinesia or fluctuating manifestations (HCC)  I63.89 (ICD-10-CM) - Cerebral infarction due to other mechanism    THERAPY DIAG:  1. Other lack of coordination (Primary) 2. Muscle weakness (generalized) 3. Essential tremor 4. Other symptoms and signs involving the nervous system     Rationale for Evaluation and Treatment: Rehabilitation  SUBJECTIVE:   SUBJECTIVE STATEMENT:  Pt reported he bumped his back on the car side mirror after he bent over to pick some thing up this weekend and so his back has been pretty sore due to history of multiple back surgeries.  Pt accompanied by: self  PERTINENT HISTORY: PMH: PD, Anxiety, HTN, HLD, multiple back surgeries/fusion neck and lumbar spine,  NSTEMI, 01/2020, hx of multiple syncopal episodes   PRECAUTIONS: Fall  WEIGHT BEARING RESTRICTIONS: No  PAIN:  Are you having pain? Yes: NPRS scale: up to 8/10 Pain location: back Pain description: aching/burning Aggravating factors: bumped his back Relieving factors: rest, stretching, heat  FALLS: Has patient fallen in last 6 months? No  LIVING ENVIRONMENT: Lives with: lives with their spouse Lives in:  House/apartment - Town Home Stairs: No Has following equipment at home: Single point cane, Environmental consultant - 4 wheeled, Grab bars, and transport chair Has a lift chair and a lift bed.   PLOF: Independent; Horticulturist, commercial; driving  PATIENT GOALS: reduce affects of tremor in RUE  OBJECTIVE:   HAND DOMINANCE: Right  ADLs: Overall ADLs: mod I Eating: setup Equipment: Shower seat with back, Grab bars, Walk in shower, Reacher, Sock aid, Long handled shoe horn, and Long handled sponge  IADLs: Shopping: able to do light shopping with shopping cart Light housekeeping: cleans bathrooms minus the tubs and uses light weight vacuum cleaners Meal Prep: Mod I Community mobility: driving Medication management: mod I Financial management: 50/50 with wife Handwriting: 25% legible  MOBILITY STATUS: Needs Assist: uses SPC and  Hx of falls  ACTIVITY TOLERANCE: Activity tolerance: fair  FUNCTIONAL OUTCOME MEASURES: To be assessed during future visits  UPPER EXTREMITY ROM:     AROM Right (eval) Left (eval)  Shoulder flexion WNL WNL  Shoulder abduction WNL WNL  Elbow flexion WNL WNL  Elbow extension WNL WNL  Wrist flexion BFL WNL  Wrist extension BFL WNL  Wrist pronation WFL WNL  Wrist supination WFL WNL   Digit Composite Flexion WFL WNL  Digit Composite Extension WNL WNL  Digit Opposition BFL WNL  (Blank rows = not tested)  UPPER EXTREMITY MMT:     MMT Right (eval) Left (eval)  Shoulder flexion [facility] WNL  Shoulder abduction WFL WNL  Elbow flexion WFL WNL  Elbow extension WFL WNL  (Blank rows = not tested)  HAND FUNCTION: Grip strength: Right: 20 lbs; Left:73 lbs  COORDINATION: 9 Hole Peg test: Right: unable to place any pegs in 60 sec (2 drops) - significant decline; Left: 44 sec - no change  Box and Blocks:  Right 22 blocks - significant decline, Left 37 blocks - no change Tremors: Right, Left, and right worse than left  Simulated Eating: Right - 20 seconds; Left (pt  using this side since stroke, especially with high viscus foods) - 21 seconds   Buttons: 85 seconds - significant decline  SENSATION: Moderate to severe paresthesias in R hand and fingers primarily but extends up to shoulder, worse since stroke  EDEMA: mild arthritic changes to R wrist  MUSCLE TONE: RUE: Rigidity; mild  COGNITION: Overall cognitive status: Within functional limits for tasks assessed  VISION: Subjective report: Just got new glasses RX; limited changes Baseline vision: Wears glasses all the time; progressive lenses Visual history: n/a  OBSERVATIONS: Bradykinesia   TODAY'S TREATMENT:                                                                                                                                Introduced various coordination activities to work on B UE finger ROM, dexterity and isolated movements with demonstration and practice, as well as modification, hand over hand guidance and cues throughout to improve technique, digital isolation and ease of performing task.  Activities also included activities to improve R wrist extension.  Reviewed previous education re: using big, deliberate movements with B UE.  Pt has previously been issued Pwr! Hand motions and is familiar with recommendations to decrease stiffness, increase digital ROM/extension, decrease tremulous motions and overall increase UE coordination for different fine motor activities.  Introduced pt to picking up different objects ie) coins, dice, nuts/bolts and other different sized objects ... To place in containers To stack - with guidance to work on include/isolate specific fingers. To pick up items one at a time until patient got 5+ in their hand and then move item from palm to fingertips to release ie) Finger-to-palm then palm-to-finger translation of small items - Options to vary difficulty include using a washcloth under  items like coins or using larger items (checkers vs coins or  blocks/dominos vs dice) for increased ease of picking up items.  Pt encouraged to consider [employer] verus checkers, [employer] and even larger puzzles which he previously enjoyed.  Education provided on attempting task with R hand, engaging L hand for feedback to R side, using edge of table as needed to increase ease with picking up objects, use large arm movements to loosen R UE as well as working on wrist extension of R hand to improve ease of R hand coordination.  Some suggestions for wrist ROM included - Forearm ROM Supination and Pronation with Hammer  - Wrist Radial Deviation with Hammer   - Wrist Extension by pulling back over ball or holding edge of table.  Patient is encouraged to take breaks, relax arm/shoulder by supporting forearm, minimize compensatory motions and a try different activities throughout the day/week including baseball activities with his grandson.     Introduced Merchant navy officer as an option for donning shoes due to back issues and occasional difficulty lining up heel with long handled shoehorn. And reviewed built up handles as option to use R UE for some tasks like brushing teeth or using a spoon with easy to scoop food.  Patient benefited from extra time, verbal/tactile cues, and modeling of task to allow time for processing of verbal instructions and improve motor planning of unfamiliar movements.  PATIENT EDUCATION: Education details: Coordination activities Person educated: Patient Education method: Explanation, Demonstration, Tactile cues, Verbal cues, and Handouts Education comprehension: verbalized understanding, returned demonstration, verbal cues required, tactile cues required, and needs further education  HOME EXERCISE PROGRAM: 03/22/23: R Wrist ROM: Access Code: [email or web address]  GOALS:  SHORT TERM GOALS: Target date: 04/06/2023     Pt will be independent with PD specific HEP.  Baseline: Goal status: IN PROGRESS  2.  Pt will verbalize understanding of  adapted strategies to maximize safety and I with ADLs/IADLs. Baseline:  Goal status: IN PROGRESS  3.  Pt will verbalize understanding of ways to prevent future PD related complications and PD community resources.  Baseline:  Goal status: INITIAL  4.  Pt will report use of R as dominant hand with feeding and show improvement to PPT #2 score.  Baseline: using LUE for eating and completed test in 20 seconds with R Goal status: IN PROGRESS   LONG TERM GOALS: Target date: 04/23/2023  Pt will demonstrate improved ease with fastening buttons as evidenced by decreasing 3 button/unbutton time by at least 16 seconds  Baseline: 86 seconds Goal status: INITIAL  2.  Patient will complete nine-hole peg with use of R in 120 seconds or less with no more than 1 drop. Baseline: Right: unable to place any pegs in 60 sec (2 drops) Goal status: INITIAL  3.  Patient will demonstrate at least 31 lbs R grip strength as needed to open jars and other containers. Baseline: Right: 20 lbs Goal status: INITIAL  4.  Pt will be able to place at least 30 blocks using right hand with completion of Box and Blocks test. Baseline: Right 22 blocks Goal status: INITIAL  ASSESSMENT:  CLINICAL IMPRESSION: Patient is a 78 y.o. male who was seen today for occupational therapy treatment for Parkinson's. Pt responded well to various ideas for coordination and ROM of R hand/wrist.  Patient currently presents below baseline level of functioning demonstrating functional deficits and impairments as noted below. Pt would benefit from skilled OT services in the outpatient setting to work  on impairments as noted below to help pt return to PLOF as able.    PERFORMANCE DEFICITS: in functional skills including ADLs, IADLs, coordination, strength, Fine motor control, Gross motor control, mobility, balance, and UE functional use.  IMPAIRMENTS: are limiting patient from ADLs, IADLs, and leisure.   COMORBIDITIES:  may have  co-morbidities  that affects occupational performance. Patient will benefit from skilled OT to address above impairments and improve overall function.  REHAB POTENTIAL: Fair given diagnosis   PLAN:  OT FREQUENCY: 2x/week  OT DURATION: 6 weeks  PLANNED INTERVENTIONS: self care/ADL training, therapeutic exercise, therapeutic activity, neuromuscular re-education, manual therapy, passive range of motion, balance training, functional mobility training, splinting, electrical stimulation, ultrasound, paraffin, fluidotherapy, moist heat, patient/family education, cognitive remediation/compensation, visual/perceptual remediation/compensation, energy conservation, coping strategies training, DME and/or AE instructions, and Re-evaluation  RECOMMENDED OTHER SERVICES: none at this time  CONSULTED AND AGREED WITH PLAN OF CARE: Patient  PLAN FOR NEXT SESSION:  review/progress HEPs - will need to review Pwr! Moves   Victorino Sparrow, OTR/L 03/22/23

## 2023-03-22 NOTE — Patient Instructions (Signed)
 Access Code: [email or web address] URL: [email or web address] Date: 03/22/2023 Prepared by: Amada Kingfisher  Exercises - Forearm PROM Supination and Pronation with Mid Grip on Hammer  - 1 x daily - 10 reps - Standing Wrist Radial Deviation with Hammer  - 1 x daily - 10 reps - Wrist AAROM Flexion and Extension  - 1 x daily - 10 reps

## 2023-03-24 ENCOUNTER — Ambulatory Visit: Payer: Medicare HMO | Admitting: Occupational Therapy

## 2023-03-24 ENCOUNTER — Encounter: Payer: Self-pay | Admitting: Occupational Therapy

## 2023-03-24 DIAGNOSIS — M6281 Muscle weakness (generalized): Secondary | ICD-10-CM

## 2023-03-24 DIAGNOSIS — R278 Other lack of coordination: Secondary | ICD-10-CM

## 2023-03-24 DIAGNOSIS — R29818 Other symptoms and signs involving the nervous system: Secondary | ICD-10-CM

## 2023-03-24 DIAGNOSIS — G20B1 Parkinson's disease with dyskinesia, without mention of fluctuations: Secondary | ICD-10-CM

## 2023-03-24 DIAGNOSIS — I6300 Cerebral infarction due to thrombosis of unspecified precerebral artery: Secondary | ICD-10-CM

## 2023-03-24 DIAGNOSIS — G250 Essential tremor: Secondary | ICD-10-CM

## 2023-03-24 DIAGNOSIS — R29898 Other symptoms and signs involving the musculoskeletal system: Secondary | ICD-10-CM

## 2023-03-24 DIAGNOSIS — R293 Abnormal posture: Secondary | ICD-10-CM

## 2023-03-24 DIAGNOSIS — R2681 Unsteadiness on feet: Secondary | ICD-10-CM

## 2023-03-24 NOTE — Patient Instructions (Signed)
 Compensation Strategies for Tremors  When eating, try the following: Eat out of bowls, divided plates, or use a plate guard (available at a medical supply store) and eat with a spoon so that you have an edge to scoop up food. Try raising your plate so that there is less distance between the plate and mouth. Try stabilizing elbows on the table or against your body. Use utensil with built-up/larger grips as they are easier to hold.  When writing, try the following:   Stabilize forearm on the table Take your time as rushing/being stressed can increase tremors. Try a felt-tipped pen or pencil, it does not glide as much.  Avoid gel pens (they move too much). Consider using pre-printed labels with your name and address (carry them with you when you go out) or you can get stamps with your address or signature on it. Use a small tape recorder to record messages/reminders for yourself. Use pens with bigger grips.  When brushing your teeth, putting on make-up, or styling hair, try the following: Use an electric toothbrush. Use items with built-up grips. Stabilize your elbows against your body or on the counter. Use long-handled brushes/combs. Use a hair dryer with a stand.  In general: Avoid stress, fatigue, or rushing as this can increase tremors. Sit down for activities that require more control/coordination.    Coordination Exercises  Perform the following exercises for 10 minutes 1-2 times per day. Perform with both hand(s), but spend more time on Rt hand. Perform using big movements.  Flipping Cards: Place deck of cards on the table. Flip cards over by opening your hand big to grasp and then turn your palm up big. Deal cards: Hold 1/2 or whole deck in your hand. Use thumb to push card off top of deck with one big push. Rotate ball with fingertips: Pick up with fingers/thumb and move as much as you can with each turn/movement (clockwise and counter-clockwise). Rotate 2 golf balls in your  hand: Both directions. Pick up coins and place in coin bank or container: Pick up with big, intentional movements. Do not drag coin to the edge. Pick up 5-10 coins one at a time and hold in palm. Then, move coins from palm to fingertips one at time and place in coin bank/container. Practice writing: Slow down, write big, and focus on forming each letter. Perform "Flicks"/hand stretches (PWR! Hands): Close hands then flick out your fingers with focus on opening hands, pulling wrists back, and extending elbows like you are pushing.

## 2023-03-24 NOTE — Therapy (Signed)
 OUTPATIENT OCCUPATIONAL THERAPY NEURO TREATMENT  Patient Name: Joshua [name] MRN: [id number] DOB:1945/10/20, 78 y.o., male Today's Date: 03/24/2023  PCP: [name], MD  REFERRING PROVIDER: Arbutus Leas Octaviano Batty, DO  END OF SESSION:  OT End of Session - 03/24/23 1103     Visit Number 3    Number of Visits 13    Date for OT Re-Evaluation 04/23/23    Authorization Type [employer]    OT Start Time 1100    OT Stop Time 1145    OT Time Calculation (min) 45 min    Activity Tolerance Patient tolerated treatment well    Behavior During Therapy [facility] for tasks assessed/performed             Past Medical History:  Diagnosis Date   Allergic rhinitis    Anxiety    from chronic pain from surgery- on Cymbalta   Arthritis    Asthma    Barrett's esophagus 03/29/2014   Carotid artery disease (HCC)    Carotid Doppler normal August, 2007   Coronary artery disease    Diverticulosis    Dyslipidemia    GERD (gastroesophageal reflux disease)    History of loop recorder    has since 04/04/15   HTN (hypertension)    takes Metoprolol for PVC control   Hx of colonic polyps    adenomatous   IFG (impaired fasting glucose)    Myocardial infarction (HCC)    mild   Neuromuscular disorder (HCC)    parkinson's tremors in hands   Palpitations    Benign PVCs   Parkinson disease (HCC)    Pneumonia    Prostate cancer (HCC)    RBBB (right bundle branch block)    rate related   Shingles    Stroke (HCC) 2017   TIA (transient ischemic attack) 02/2015   Per pt, had 2 strokes   Vertigo    Past Surgical History:  Procedure Laterality Date   BACK SURGERY  2002,2009   x 6   CHOLECYSTECTOMY  11/30/2012   with IOC   COLONOSCOPY     ELECTROPHYSIOLOGIC STUDY N/A 09/26/2015   Procedure: V Tach Ablation (PVC);  Surgeon: Will Jorja Loa, MD;  Location: [facility];  Service: Cardiovascular;  Laterality: N/A;   EP IMPLANTABLE DEVICE N/A 04/04/2015   Procedure: Loop  Recorder Insertion;  Surgeon: Will Jorja Loa, MD;  Location: [facility];  Service: Cardiovascular;  Laterality: N/A;   HERNIA REPAIR     laprascopic   KNEE SURGERY     DECEMBER 2017,LEFT KNEE SCOPED   LEFT HEART CATH AND CORONARY ANGIOGRAPHY N/A 02/05/2021   Procedure: LEFT HEART CATH AND CORONARY ANGIOGRAPHY;  Surgeon: Iran Ouch, MD;  Location: [facility];  Service: Cardiovascular;  Laterality: N/A;   NECK SURGERY  2002   POLYPECTOMY     ROTATOR CUFF REPAIR Left    TEE WITHOUT CARDIOVERSION N/A 04/04/2015   Procedure: TRANSESOPHAGEAL ECHOCARDIOGRAM (TEE);  Surgeon: Lewayne Bunting, MD;  Location[facility];  Service: Cardiovascular;  Laterality: N/A;   TOTAL KNEE ARTHROPLASTY Left 09/28/2022   Procedure: TOTAL KNEE ARTHROPLASTY;  Surgeon: Joen Laura, MD;  Location: [facility];  Service: Orthopedics;  Laterality: Left;   TOTAL KNEE ARTHROPLASTY Right 01/11/2023   Procedure: TOTAL KNEE ARTHROPLASTY;  Surgeon: Joen Laura, MD;  Location: [facility];  Service: Orthopedics;  Laterality: Right;   TRIGGER FINGER RELEASE Left 12/28/2019   Procedure: RELEASE TRIGGER FINGER/A-1 PULLEY THUMB,  MIDDLE AND RING;  Surgeon: Cindee Salt, MD;  Location: [facility];  Service: Orthopedics;  Laterality: Left;  FAB   UPPER GASTROINTESTINAL ENDOSCOPY     V Tach ablation  09/26/2015   Patient Active Problem List   Diagnosis Date Noted   CVA (cerebral vascular accident) (HCC) 01/15/2023   Primary osteoarthritis of right knee 01/11/2023   Localized osteoarthritis of right knee 01/11/2023   Primary osteoarthritis of left knee 09/28/2022   SBO (small bowel obstruction) (HCC) 05/29/2022   Chest pain 05/26/2022   Epigastric abdominal pain 05/26/2022   Coronary artery disease involving native coronary artery of native heart without angina pectoris 05/28/2021   History of myocardial infarction 02/05/2021   Parkinson's disease (HCC)    Malignant neoplasm of  prostate (HCC) 11/01/2017   Essential tremor 12/06/2015   PVC (premature ventricular contraction) 09/26/2015   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) 04/07/2015   Acute CVA (cerebrovascular accident) (HCC) 03/06/2015   Stroke (cerebrum) (HCC) 03/05/2015   Cerebral infarction (HCC) 03/05/2015   Weakness of right upper extremity    Tremor    Ejection fraction    Vertigo    Sleep apnea    Palpitations    Hyperlipidemia with target LDL less than 70    GERD (gastroesophageal reflux disease)    HTN (hypertension)    Chest pain    RBBB (right bundle branch block)    Ejection fraction    Carotid artery disease (HCC)     ONSET DATE: 03/01/2023 (Date of referral); 01/11/2023 date of stroke and RTKA  REFERRING DIAG:  G20.A1 (ICD-10-CM) - Parkinson's disease without dyskinesia or fluctuating manifestations (HCC)  I63.89 (ICD-10-CM) - Cerebral infarction due to other mechanism    THERAPY DIAG:  1. Other lack of coordination (Primary) 2. Muscle weakness (generalized) 3. Essential tremor 4. Other symptoms and signs involving the nervous system     Rationale for Evaluation and Treatment: Rehabilitation  SUBJECTIVE:   SUBJECTIVE STATEMENT:  Pt reported he bumped his back on the car side mirror after he bent over to pick some thing up this weekend and so his back has been pretty sore due to history of multiple back surgeries. No falls   Pt accompanied by: self  PERTINENT HISTORY: PMH: Rt TKA 01/11/23 with subsequent acute Lt CVA affecting Rt side, PD, Anxiety, HTN, HLD, multiple back surgeries/fusion neck and lumbar spine,  NSTEMI, 01/2020, hx of multiple syncopal episodes, CVA in 2017   PRECAUTIONS: Fall  WEIGHT BEARING RESTRICTIONS: No  PAIN:  Are you having pain? Yes: NPRS scale: up to 2-8/10 Pain location: back Pain description: aching/burning Aggravating factors: bumped his back Relieving factors: rest, stretching, heat  FALLS: Has patient fallen in  last 6 months? No  LIVING ENVIRONMENT: Lives with: lives with their spouse Lives in: House/apartment - Town Home Stairs: No Has following equipment at home: Single point cane, Environmental consultant - 4 wheeled, Grab bars, and transport chair Has a lift chair and a lift bed.   PLOF: Independent; Horticulturist, commercial; driving  PATIENT GOALS: reduce affects of tremor in RUE  OBJECTIVE:   HAND DOMINANCE: Right  ADLs: Overall ADLs: mod I except buttons (unable to do buttons), difficulty with smaller zippers Eating: setup but only 40% with Rt dominant hand w/ spills Grooming: brushing teeth using electric toothbrush using only Rt hand 40% Equipment: Shower seat with back, Grab bars, Walk in shower, Reacher, Sock aid, Long handled shoe horn, and Long handled sponge  IADLs: Shopping: able  to do light shopping with shopping cart Light housekeeping: cleans bathrooms minus the tubs and uses light weight vacuum cleaners Meal Prep: Mod I Community mobility: driving Medication management: mod I Financial management: 50/50 with wife Handwriting: 25% legible  MOBILITY STATUS: Needs Assist: uses SPC and Hx of falls  ACTIVITY TOLERANCE: Activity tolerance: fair  FUNCTIONAL OUTCOME MEASURES: To be assessed during future visits  UPPER EXTREMITY ROM:     AROM Right (eval) Left (eval)  Shoulder flexion WNL WNL  Shoulder abduction WNL WNL  Elbow flexion WNL WNL  Elbow extension WNL WNL  Wrist flexion BFL WNL  Wrist extension none WNL  Wrist pronation WFL WNL  Wrist supination WFL WNL   Digit Composite Flexion WFL WNL  Digit Composite Extension WNL WNL  Digit Opposition BFL WNL  (Blank rows = not tested)  UPPER EXTREMITY MMT:     MMT Right (eval) Left (eval)  Shoulder flexion WFL WNL  Shoulder abduction WFL WNL  Elbow flexion WFL WNL  Elbow extension WFL WNL  (Blank rows = not tested)  HAND FUNCTION: Grip strength: Right: 20 lbs; Left:73 lbs  COORDINATION: 9 Hole Peg test: Right:  unable to place any pegs in 60 sec (2 drops) - significant decline; Left: 44 sec - no change  Box and Blocks:  Right 22 blocks - significant decline, Left 37 blocks - no change Tremors: Right, Left, and right worse than left  Simulated Eating: Right - 20 seconds; Left (pt using this side since stroke, especially with high viscus foods) - 21 seconds   Buttons: 85 seconds - significant decline  SENSATION: Moderate to severe paresthesias in R hand and fingers primarily but extends up to shoulder, worse since stroke  EDEMA: mild arthritic changes to R wrist  MUSCLE TONE: RUE: Rigidity; mild  COGNITION: Overall cognitive status: Within functional limits for tasks assessed  VISION: Subjective report: Just got new glasses RX; limited changes Baseline vision: Wears glasses all the time; progressive lenses Visual history: n/a  OBSERVATIONS: Bradykinesia, Pt with worse tremors on Rt side, milder on Lt side. Decreased coordination Rt side from CVA w/ increase in PD symptoms.  Pt w/ h/o 7 back surgeries and 1 neck sx   TODAY'S TREATMENT:                                                                                                                                Pt issued strategies for tremors and reviewed. Practiced simulated eating trying various strategies - however pt did best with just built up spoon and cued to bring spoon/fork all the way to mouth vs going down to spoon causing forward head. Recommended elevating bowl for soup or cereal. Encouraged pt to use Rt hand more for self feeding  Practiced writing with significantly impaired legibility d/t tremors - pt with slightly increased legibility using pencil.   Pt shown PWR! Hands Push to stretch hands out prn when eating, writing, etc.  Pt required cues for full elbow extension (especially on Rt) with limited to no wrist extension Rt wrist d/t: tone, tremors fighting against it, and OA w/ some contracture. Pt unable to perform wrist  ext when standing wt bearing over hands either  Pt issued coordination HEP and practiced however significant modifications were required to several ex's for Rt hand (unable to rotate ball in fingertips therefore cued to work on just flipping ball over w/ max difficulty; unable to rotate/translate golf balls either hand but w/ practice - feel he can eventually do with Lt hand; larger coins for Rt hand)   PATIENT EDUCATION: Education details: Coordination HEP, strategies for tremors Person educated: Patient Education method: Explanation, Demonstration, Tactile cues, Verbal cues, and Handouts Education comprehension: verbalized understanding, returned demonstration, verbal cues required, tactile cues required, and needs further education  HOME EXERCISE PROGRAM: 03/22/23: R Wrist ROM: Access Code: [email or web address] 03/24/23: Coordination HEP, strategies for tremors  GOALS:  SHORT TERM GOALS: Target date: 04/06/2023     Pt will be independent with PD specific HEP.  Baseline: Goal status: IN PROGRESS  2.  Pt will verbalize understanding of adapted strategies to maximize safety and I with ADLs/IADLs. Baseline:  Goal status: IN PROGRESS  3.  Pt will verbalize understanding of ways to prevent future PD related complications and PD community resources.  Baseline:  Goal status: INITIAL  4.  Pt will report use of R as dominant hand with feeding and show improvement to PPT #2 score.  Baseline: using LUE for eating and completed test in 20 seconds with R Goal status: IN PROGRESS   LONG TERM GOALS: Target date: 04/23/2023  Pt will demonstrate improved ease with fastening buttons as evidenced by decreasing 3 button/unbutton time by at least 16 seconds  Baseline: 86 seconds Goal status: INITIAL  2.  Patient will complete nine-hole peg with use of R in 120 seconds or less with no more than 1 drop. Baseline: Right: unable to place any pegs in 60 sec (2 drops) Goal status: INITIAL  3.  Patient will  demonstrate at least 31 lbs R grip strength as needed to open jars and other containers. Baseline: Right: 20 lbs Goal status: INITIAL  4.  Pt will be able to place at least 30 blocks using right hand with completion of Box and Blocks test. Baseline: Right 22 blocks Goal status: INITIAL  ASSESSMENT:  CLINICAL IMPRESSION: Patient is a 78 y.o. male who was seen today for occupational therapy treatment for Parkinson's and CVA affecting Rt side. Pt limited by tremors Rt hand, decreased function Rt side since CVA as well as PMH of back surgeries and more recent Rt knee surgery.  Pt however very pleasant and willing to try recommendations and ex's to improve function. Patient currently presents below baseline level of functioning demonstrating functional deficits and impairments as noted below. Pt would benefit from skilled OT services in the outpatient setting to work on impairments as noted below to help pt return to PLOF as able.    PERFORMANCE DEFICITS: in functional skills including ADLs, IADLs, coordination, strength, Fine motor control, Gross motor control, mobility, balance, and UE functional use.  IMPAIRMENTS: are limiting patient from ADLs, IADLs, and leisure.   COMORBIDITIES:  may have co-morbidities  that affects occupational performance. Patient will benefit from skilled OT to address above impairments and improve overall function.  REHAB POTENTIAL: Fair given diagnosis   PLAN:  OT FREQUENCY: 2x/week  OT DURATION: 6 weeks  PLANNED INTERVENTIONS: self care/ADL training,  therapeutic exercise, therapeutic activity, neuromuscular re-education, manual therapy, passive range of motion, balance training, functional mobility training, splinting, electrical stimulation, ultrasound, paraffin, fluidotherapy, moist heat, patient/family education, cognitive remediation/compensation, visual/perceptual remediation/compensation, energy conservation, coping strategies training, DME and/or AE  instructions, and Re-evaluation  RECOMMENDED OTHER SERVICES: none at this time  CONSULTED AND AGREED WITH PLAN OF CARE: Patient  PLAN FOR NEXT SESSION:  PWR! Moves modified prn d/t previous back surgeries/pain   Sheran Lawless, OT 03/24/2023, 12:21 PM

## 2023-03-29 ENCOUNTER — Ambulatory Visit: Payer: Medicare HMO | Admitting: Occupational Therapy

## 2023-03-29 ENCOUNTER — Encounter: Payer: Self-pay | Admitting: Speech Pathology

## 2023-03-29 ENCOUNTER — Ambulatory Visit: Payer: Medicare HMO | Admitting: Speech Pathology

## 2023-03-29 DIAGNOSIS — I6300 Cerebral infarction due to thrombosis of unspecified precerebral artery: Secondary | ICD-10-CM

## 2023-03-29 DIAGNOSIS — R278 Other lack of coordination: Secondary | ICD-10-CM | POA: Diagnosis not present

## 2023-03-29 DIAGNOSIS — G250 Essential tremor: Secondary | ICD-10-CM

## 2023-03-29 DIAGNOSIS — G20B1 Parkinson's disease with dyskinesia, without mention of fluctuations: Secondary | ICD-10-CM

## 2023-03-29 DIAGNOSIS — R29898 Other symptoms and signs involving the musculoskeletal system: Secondary | ICD-10-CM

## 2023-03-29 DIAGNOSIS — M6281 Muscle weakness (generalized): Secondary | ICD-10-CM

## 2023-03-29 DIAGNOSIS — R471 Dysarthria and anarthria: Secondary | ICD-10-CM

## 2023-03-29 DIAGNOSIS — R29818 Other symptoms and signs involving the nervous system: Secondary | ICD-10-CM

## 2023-03-29 DIAGNOSIS — F801 Expressive language disorder: Secondary | ICD-10-CM

## 2023-03-29 NOTE — Patient Instructions (Signed)
  Parkinson Radiation protection practitioner  Under Education you and [name] watch:  What is Parkinson's Living with Parkinson's  You should get a workbook in the mail - bring it to therapy  Daughters need to watch the videos as well  Slow processing can be part of a stroke and PD - fast moving conversations, group conversations, questions - you may need a few extra milliseconds to respond -   Other people don't like a pause in conversation so they will ask a follow up questions or try to fill in for you - this makes it harder for you to process   You may find that you are not participating in conversations as much due to the slower processing  Educate your family to give you an extra second  Multi tasking may also be harder - if there is an important conversation it would be best to do it in a quiet place where you are concentrating     Get the persons attention before you speak  Use eye contact and face the person you are speaking to  Be in close proximity to the person you are speaking to  Turn down any noise in the environment such as the TV, walk away from loud appliances, air conditioners, fans, dish washers etc  In large gatherings, sit or stay on the side not the center of the room  Try to sit with a wall behind you or in a corner so noise isn't coming at you from all directions when dining out or attending gatherings

## 2023-03-29 NOTE — Therapy (Signed)
 OUTPATIENT OCCUPATIONAL THERAPY NEURO TREATMENT  Patient Name: Joshua [name] MRN: [id number] DOB:[birth year], 78 y.o., male Today's Date: 03/29/2023  PCP: [name], MD  REFERRING PROVIDER: Arbutus Leas Octaviano Batty, DO  END OF SESSION:  OT End of Session - 03/29/23 1016     Visit Number 4    Number of Visits 13    Date for OT Re-Evaluation 04/23/23    Authorization Type [employer]    OT Start Time 1018    OT Stop Time 1101    OT Time Calculation (min) 43 min    Activity Tolerance Patient tolerated treatment well    Behavior During Therapy [facility] for tasks assessed/performed            Past Medical History:  Diagnosis Date   Allergic rhinitis    Anxiety    from chronic pain from surgery- on Cymbalta   Arthritis    Asthma    Barrett's esophagus 03/29/2014   Carotid artery disease (HCC)    Carotid Doppler normal August, 2007   Coronary artery disease    Diverticulosis    Dyslipidemia    GERD (gastroesophageal reflux disease)    History of loop recorder    has since 04/04/15   HTN (hypertension)    takes Metoprolol for PVC control   Hx of colonic polyps    adenomatous   IFG (impaired fasting glucose)    Myocardial infarction (HCC)    mild   Neuromuscular disorder (HCC)    parkinson's tremors in hands   Palpitations    Benign PVCs   Parkinson disease (HCC)    Pneumonia    Prostate cancer (HCC)    RBBB (right bundle branch block)    rate related   Shingles    Stroke (HCC) 2017   TIA (transient ischemic attack) 02/2015   Per pt, had 2 strokes   Vertigo    Past Surgical History:  Procedure Laterality Date   BACK SURGERY  2002,2009   x 6   CHOLECYSTECTOMY  11/30/2012   with IOC   COLONOSCOPY     ELECTROPHYSIOLOGIC STUDY N/A 09/26/2015   Procedure: V Tach Ablation (PVC);  Surgeon: Will Jorja Loa, MD;  Location: [facility] INVASIVE CV [facility];  Service: Cardiovascular;  Laterality: N/A;   EP IMPLANTABLE DEVICE N/A 04/04/2015   Procedure: Loop Recorder  Insertion;  Surgeon: Will Jorja Loa, MD;  Location: [facility] INVASIVE CV [facility];  Service: Cardiovascular;  Laterality: N/A;   HERNIA REPAIR     laprascopic   KNEE SURGERY     DECEMBER 2017,LEFT KNEE SCOPED   LEFT HEART CATH AND CORONARY ANGIOGRAPHY N/A 02/05/2021   Procedure: LEFT HEART CATH AND CORONARY ANGIOGRAPHY;  Surgeon: Iran Ouch, MD;  Location: [facility] INVASIVE CV LAB;  Service: Cardiovascular;  Laterality: N/A;   NECK SURGERY  2002   POLYPECTOMY     ROTATOR CUFF REPAIR Left    TEE WITHOUT CARDIOVERSION N/A 04/04/2015   Procedure: TRANSESOPHAGEAL ECHOCARDIOGRAM (TEE);  Surgeon: Lewayne Bunting, MD;  Location: [facility] ENDOSCOPY;  Service: Cardiovascular;  Laterality: N/A;   TOTAL KNEE ARTHROPLASTY Left 09/28/2022   Procedure: TOTAL KNEE ARTHROPLASTY;  Surgeon: Joen Laura, MD;  Location: [facility] ORS;  Service: Orthopedics;  Laterality: Left;   TOTAL KNEE ARTHROPLASTY Right 01/11/2023   Procedure: TOTAL KNEE ARTHROPLASTY;  Surgeon: Joen Laura, MD;  Location: [facility] ORS;  Service: Orthopedics;  Laterality: Right;   TRIGGER FINGER RELEASE Left 12/28/2019   Procedure: RELEASE TRIGGER FINGER/A-1 PULLEY THUMB, MIDDLE  AND RING;  Surgeon: Cindee Salt, MD;  Location: [facility];  Service: Orthopedics;  Laterality: Left;  FAB   UPPER GASTROINTESTINAL ENDOSCOPY     V Tach ablation  09/26/2015   Patient Active Problem List   Diagnosis Date Noted   CVA (cerebral vascular accident) (HCC) 01/15/2023   Primary osteoarthritis of right knee 01/11/2023   Localized osteoarthritis of right knee 01/11/2023   Primary osteoarthritis of left knee 09/28/2022   SBO (small bowel obstruction) (HCC) 05/29/2022   Chest pain 05/26/2022   Epigastric abdominal pain 05/26/2022   Coronary artery disease involving native coronary artery of native heart without angina pectoris 05/28/2021   History of myocardial infarction 02/05/2021   Parkinson's disease (HCC)    Malignant neoplasm of prostate  (HCC) 11/01/2017   Essential tremor 12/06/2015   PVC (premature ventricular contraction) 09/26/2015   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) 04/07/2015   Acute CVA (cerebrovascular accident) (HCC) 03/06/2015   Stroke (cerebrum) (HCC) 03/05/2015   Cerebral infarction (HCC) 03/05/2015   Weakness of right upper extremity    Tremor    Ejection fraction    Vertigo    Sleep apnea    Palpitations    Hyperlipidemia with target LDL less than 70    GERD (gastroesophageal reflux disease)    HTN (hypertension)    Chest pain    RBBB (right bundle branch block)    Ejection fraction    Carotid artery disease (HCC)     ONSET DATE: 03/01/2023 (Date of referral); 01/11/2023 date of stroke and RTKA  REFERRING DIAG:  G20.A1 (ICD-10-CM) - Parkinson's disease without dyskinesia or fluctuating manifestations (HCC)  I63.89 (ICD-10-CM) - Cerebral infarction due to other mechanism    THERAPY DIAG:  1. Other lack of coordination (Primary) 2. Muscle weakness (generalized) 3. Essential tremor 4. Other symptoms and signs involving the nervous system     Rationale for Evaluation and Treatment: Rehabilitation  SUBJECTIVE:   SUBJECTIVE STATEMENT:  Pt reported he was unable to fall asleep and was in terrible pain from back spasms.   Pt accompanied by: self  PERTINENT HISTORY: PMH: Rt TKA 01/11/23 with subsequent acute Lt CVA affecting Rt side, PD, Anxiety, HTN, HLD, multiple back surgeries/fusion neck and lumbar spine,  NSTEMI, 01/2020, hx of multiple syncopal episodes, CVA in 2017   PRECAUTIONS: Fall  WEIGHT BEARING RESTRICTIONS: No  PAIN:  Are you having pain? Yes: NPRS scale: up to 2-8/10 Pain location: back Pain description: aching/burning Aggravating factors: bumped his back Relieving factors: rest, stretching, heat  FALLS: Has patient fallen in last 6 months? No  LIVING ENVIRONMENT: Lives with: lives with their spouse Lives in: House/apartment - Town Home Stairs:  No Has following equipment at home: Single point cane, Environmental consultant - 4 wheeled, Grab bars, and transport chair Has a lift chair and a lift bed.   PLOF: Independent; Horticulturist, commercial; driving  PATIENT GOALS: reduce affects of tremor in RUE  OBJECTIVE:   HAND DOMINANCE: Right  ADLs: Overall ADLs: mod I except buttons (unable to do buttons), difficulty with smaller zippers Eating: setup but only 40% with Rt dominant hand w/ spills Grooming: brushing teeth using electric toothbrush using only Rt hand 40% Equipment: Shower seat with back, Grab bars, Walk in shower, Reacher, Sock aid, Long handled shoe horn, and Long handled sponge  IADLs: Shopping: able to do light shopping with shopping cart Light housekeeping: cleans bathrooms minus the tubs and uses light weight vacuum cleaners Meal Prep: Mod I  Community mobility: driving Medication management: mod I Financial management: 50/50 with wife Handwriting: 25% legible  MOBILITY STATUS: Needs Assist: uses SPC and Hx of falls  ACTIVITY TOLERANCE: Activity tolerance: fair  FUNCTIONAL OUTCOME MEASURES: To be assessed during future visits  UPPER EXTREMITY ROM:     AROM Right (eval) Left (eval)  Shoulder flexion WNL WNL  Shoulder abduction WNL WNL  Elbow flexion WNL WNL  Elbow extension WNL WNL  Wrist flexion BFL WNL  Wrist extension none WNL  Wrist pronation WFL WNL  Wrist supination WFL WNL   Digit Composite Flexion WFL WNL  Digit Composite Extension WNL WNL  Digit Opposition BFL WNL  (Blank rows = not tested)  UPPER EXTREMITY MMT:     MMT Right (eval) Left (eval)  Shoulder flexion WFL WNL  Shoulder abduction WFL WNL  Elbow flexion WFL WNL  Elbow extension WFL WNL  (Blank rows = not tested)  HAND FUNCTION: Grip strength: Right: 20 lbs; Left:73 lbs  COORDINATION: 9 Hole Peg test: Right: unable to place any pegs in 60 sec (2 drops) - significant decline; Left: 44 sec - no change  Box and Blocks:  Right 22  blocks - significant decline, Left 37 blocks - no change Tremors: Right, Left, and right worse than left  Simulated Eating: Right - 20 seconds; Left (pt using this side since stroke, especially with high viscus foods) - 21 seconds   Buttons: 85 seconds - significant decline  SENSATION: Moderate to severe paresthesias in R hand and fingers primarily but extends up to shoulder, worse since stroke  EDEMA: mild arthritic changes to R wrist  MUSCLE TONE: RUE: Rigidity; mild  COGNITION: Overall cognitive status: Within functional limits for tasks assessed  VISION: Subjective report: Just got new glasses RX; limited changes Baseline vision: Wears glasses all the time; progressive lenses Visual history: n/a  OBSERVATIONS: Bradykinesia, Pt with worse tremors on Rt side, milder on Lt side. Decreased coordination Rt side from CVA w/ increase in PD symptoms.  Pt w/ h/o 7 back surgeries and 1 neck sx   TODAY'S TREATMENT:                                                                                                                                Pt reviewed coordination HEP and practiced with new modifications for Rt hand. Pt required increased time and cueing to improve performance B. Reprinted handout  PATIENT EDUCATION: Education details: Coordination HEP Person educated: Patient Education method: Explanation, Demonstration, Tactile cues, Verbal cues, and Handouts Education comprehension: verbalized understanding, returned demonstration, verbal cues required, tactile cues required, and needs further education  HOME EXERCISE PROGRAM: 03/22/23: R Wrist ROM: Access Code: [email or web address] 03/24/23: Coordination HEP, strategies for tremors  GOALS:  SHORT TERM GOALS: Target date: 04/06/2023     Pt will be independent with PD specific HEP.  Baseline: Goal status: IN PROGRESS  2.  Pt will verbalize understanding of adapted strategies to  maximize safety and I with ADLs/IADLs. Baseline:  Goal  status: IN PROGRESS  3.  Pt will verbalize understanding of ways to prevent future PD related complications and PD community resources.  Baseline:  Goal status: INITIAL  4.  Pt will report use of R as dominant hand with feeding and show improvement to PPT #2 score.  Baseline: using LUE for eating and completed test in 20 seconds with R Goal status: IN PROGRESS   LONG TERM GOALS: Target date: 04/23/2023  Pt will demonstrate improved ease with fastening buttons as evidenced by decreasing 3 button/unbutton time by at least 16 seconds  Baseline: 86 seconds Goal status: INITIAL  2.  Patient will complete nine-hole peg with use of R in 120 seconds or less with no more than 1 drop. Baseline: Right: unable to place any pegs in 60 sec (2 drops) Goal status: INITIAL  3.  Patient will demonstrate at least 31 lbs R grip strength as needed to open jars and other containers. Baseline: Right: 20 lbs Goal status: INITIAL  4.  Pt will be able to place at least 30 blocks using right hand with completion of Box and Blocks test. Baseline: Right 22 blocks Goal status: INITIAL  ASSESSMENT:  CLINICAL IMPRESSION: Patient demonstrates better performance of coordination HEP with modifications for R hand as needed to progress towards goals. Lacks in hand coordination and thumb AROM.   PERFORMANCE DEFICITS: in functional skills including ADLs, IADLs, coordination, strength, Fine motor control, Gross motor control, mobility, balance, and UE functional use.  IMPAIRMENTS: are limiting patient from ADLs, IADLs, and leisure.   COMORBIDITIES:  may have co-morbidities  that affects occupational performance. Patient will benefit from skilled OT to address above impairments and improve overall function.  REHAB POTENTIAL: Fair given diagnosis   PLAN:  OT FREQUENCY: 2x/week  OT DURATION: 6 weeks  PLANNED INTERVENTIONS: self care/ADL training, therapeutic exercise, therapeutic activity, neuromuscular  re-education, manual therapy, passive range of motion, balance training, functional mobility training, splinting, electrical stimulation, ultrasound, paraffin, fluidotherapy, moist heat, patient/family education, cognitive remediation/compensation, visual/perceptual remediation/compensation, energy conservation, coping strategies training, DME and/or AE instructions, and Re-evaluation  RECOMMENDED OTHER SERVICES: none at this time  CONSULTED AND AGREED WITH PLAN OF CARE: Patient  PLAN FOR NEXT SESSION:  PWR! Moves modified prn d/t previous back surgeries/pain Pt to bring in binder/stack of exercises - PWR! Hands - sitting  Delana Meyer, OT 03/29/2023, 11:09 AM

## 2023-03-29 NOTE — Patient Instructions (Signed)
 Coordination Exercises   Perform the following exercises for 10 minutes 1-2 times per day. Perform with both hand(s) but spend more time on Rt hand. Perform using big movements.   Flipping Cards: Place deck of cards on the table. Flip cards over by opening your hand big to grasp and then turn your palm up big (can tap back of hand on table). Make sure you're sitting up straight.   Deal cards: Hold 1/2 or whole deck in your hand. Use thumb to push card off top of deck with one big push. Practice thumb "flicks" either from one end of card to another or from inside of palm straight up (like you're hitchhiking)  Rotate ball with fingertips: Pick up with fingers/thumb and move as much as you can with each turn/movement (clockwise and counter-clockwise). Mark your starting position and feel free to start with a lid (in hand or on table) before going to a ball.  Rotate 2 golf balls in your hand: Both directions. Can do on table. Focus doing this with the left hand for now.   Pick up coins and place in coin bank or container: Pick up with big, intentional movements. Do not drag coin to the edge.  Pick up 5-10 coins one at a time and hold in palm. Then, move coins from palm to fingertips one at time and place in coin bank/container.  Practice writing: Slow down, write big, and focus on forming each letter.

## 2023-03-29 NOTE — Therapy (Signed)
 OUTPATIENT SPEECH LANGUAGE PATHOLOGY PARKINSON'S EVALUATION   Patient Name: Joshua [name] MRN: [id number] DOB:02/07/1945, 78 y.o., male Today's Date: 03/29/2023  PCP: Cleatis Polka, MD REFERRING PROVIDER: Vladimir Faster, DO  END OF SESSION:  End of Session - 03/29/23 1211     Visit Number 1    Number of Visits 17    Date for SLP Re-Evaluation 05/24/23    SLP Start Time 1100    SLP Stop Time  1145    SLP Time Calculation (min) 45 min    Activity Tolerance Patient tolerated treatment well             Past Medical History:  Diagnosis Date   Allergic rhinitis    Anxiety    from chronic pain from surgery- on Cymbalta   Arthritis    Asthma    Barrett's esophagus 03/29/2014   Carotid artery disease (HCC)    Carotid Doppler normal August, 2007   Coronary artery disease    Diverticulosis    Dyslipidemia    GERD (gastroesophageal reflux disease)    History of loop recorder    has since 04/04/15   HTN (hypertension)    takes Metoprolol for PVC control   Hx of colonic polyps    adenomatous   IFG (impaired fasting glucose)    Myocardial infarction (HCC)    mild   Neuromuscular disorder (HCC)    parkinson's tremors in hands   Palpitations    Benign PVCs   Parkinson disease (HCC)    Pneumonia    Prostate cancer (HCC)    RBBB (right bundle branch block)    rate related   Shingles    Stroke (HCC) 2017   TIA (transient ischemic attack) 02/2015   Per pt, had 2 strokes   Vertigo    Past Surgical History:  Procedure Laterality Date   BACK SURGERY  2002,2009   x 6   CHOLECYSTECTOMY  11/30/2012   with IOC   COLONOSCOPY     ELECTROPHYSIOLOGIC STUDY N/A 09/26/2015   Procedure: V Tach Ablation (PVC);  Surgeon: Will Jorja Loa, MD;  Location: [facility];  Service: Cardiovascular;  Laterality: N/A;   EP IMPLANTABLE DEVICE N/A 04/04/2015   Procedure: Loop Recorder Insertion;  Surgeon: Will Jorja Loa, MD;  Location: [facility];   Service: Cardiovascular;  Laterality: N/A;   HERNIA REPAIR     laprascopic   KNEE SURGERY     DECEMBER 2017,LEFT KNEE SCOPED   LEFT HEART CATH AND CORONARY ANGIOGRAPHY N/A 02/05/2021   Procedure: LEFT HEART CATH AND CORONARY ANGIOGRAPHY;  Surgeon: Iran Ouch, MD;  Location: [facility];  Service: Cardiovascular;  Laterality: N/A;   NECK SURGERY  2002   POLYPECTOMY     ROTATOR CUFF REPAIR Left    TEE WITHOUT CARDIOVERSION N/A 04/04/2015   Procedure: TRANSESOPHAGEAL ECHOCARDIOGRAM (TEE);  Surgeon: Lewayne Bunting, MD;  Location: [facility];  Service: Cardiovascular;  Laterality: N/A;   TOTAL KNEE ARTHROPLASTY Left 09/28/2022   Procedure: TOTAL KNEE ARTHROPLASTY;  Surgeon: Joen Julane Crock, MD;  Location: [facility];  Service: Orthopedics;  Laterality: Left;   TOTAL KNEE ARTHROPLASTY Right 01/11/2023   Procedure: TOTAL KNEE ARTHROPLASTY;  Surgeon: Joen Jermal Dismuke, MD;  Location: [facility];  Service: Orthopedics;  Laterality: Right;   TRIGGER FINGER RELEASE Left 12/28/2019   Procedure: RELEASE TRIGGER FINGER/A-1 PULLEY THUMB, MIDDLE AND RING;  Surgeon: Cindee Salt, MD;  Location: [facility];  Service:  Orthopedics;  Laterality: Left;  FAB   UPPER GASTROINTESTINAL ENDOSCOPY     V Tach ablation  09/26/2015   Patient Active Problem List   Diagnosis Date Noted   CVA (cerebral vascular accident) (HCC) 01/15/2023   Primary osteoarthritis of right knee 01/11/2023   Localized osteoarthritis of right knee 01/11/2023   Primary osteoarthritis of left knee 09/28/2022   SBO (small bowel obstruction) (HCC) 05/29/2022   Chest pain 05/26/2022   Epigastric abdominal pain 05/26/2022   Coronary artery disease involving native coronary artery of native heart without angina pectoris 05/28/2021   History of myocardial infarction 02/05/2021   Parkinson's disease (HCC)    Malignant neoplasm of prostate (HCC) 11/01/2017   Essential tremor 12/06/2015   PVC (premature ventricular  contraction) 09/26/2015   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) 04/07/2015   Acute CVA (cerebrovascular accident) (HCC) 03/06/2015   Stroke (cerebrum) (HCC) 03/05/2015   Cerebral infarction (HCC) 03/05/2015   Weakness of right upper extremity    Tremor    Ejection fraction    Vertigo    Sleep apnea    Palpitations    Hyperlipidemia with target LDL less than 70    GERD (gastroesophageal reflux disease)    HTN (hypertension)    Chest pain    RBBB (right bundle branch block)    Ejection fraction    Carotid artery disease (HCC)     ONSET DATE: 03/01/2023 (Date of referral); 01/11/2023 date of stroke and RTKA, Dx PD 2017  REFERRING DIAG: G20.A1 (ICD-10-CM) - Parkinson's disease without dyskinesia or fluctuating manifestations (HCC) I63.89 (ICD-10-CM) - Cerebral infarction due to other mechanism  THERAPY DIAG:  Dysarthria and anarthria  Expressive language impairment  Rationale for Evaluation and Treatment: Rehabilitation  SUBJECTIVE:   SUBJECTIVE STATEMENT: "My speech has been more jumpy since the stroke and my voice his hoarse from the Parkinson's" Pt accompanied by: self  PERTINENT HISTORY:   Patient went to the hospital for right knee replacement January 11, 2023.  Unfortunately, patient woke up from anesthesia with right-sided weakness.  MRI brain demonstrated acute infarct in the left precentral and middle frontal gyrus.  Patient was seen by Dr. Pearlean Brownie.  Patient was obviously off of the Plavix for the knee replacement.  His LDL was only 23.  His left ventricular ejection fraction was 50 to 55%.  CTA of the head and neck demonstrated no significant stenosis of the carotids.  There is mild to moderate left vertebral artery stenosis.  Patient subsequently went to inpatient rehab, primarily for the expressive aphasia and mild right hemiparesis. PMH + PD which was dx in 2017 - he has never received ST for PD.   PAIN:  Are you having pain? No  FALLS: Has  patient fallen in last 6 months?  See PT evaluation for details - received PT for TKR at another clinic. No PT here at this time.   LIVING ENVIRONMENT: Lives with: lives with their spouse Lives in: House/apartment  PLOF:  Level of assistance: Independent with ADLs, Independent with IADLs Employment: Retired  PATIENT GOALS: "I want to work on improving my speech and pronunciation"  OBJECTIVE:  Note: Objective measures were completed at Evaluation unless otherwise noted.  DIAGNOSTIC FINDINGS: Acute infarct of the left precentral gyrus and middle frontal gyrus with petechial hemorrhage at the infarct site. No mass effect or midline shift.    COGNITION: Overall cognitive status: Within functional limits for tasks assessed Areas of impairment: Slow Processing endorsed Comments: denies memory difficulty,  not losing items, he is completing household tasks as physically able. Manage meds independently  VERBAL EXPRESSION:            Divergent naming animals: 9                                         "M" words: 3             No word finding difficulty in simple conversation today  MOTOR SPEECH: Overall motor speech: impaired Level of impairment: Phrase Respiration: thoracic breathing Phonation: hoarse and low vocal intensity Resonance: WFL Articulation: Impaired: phrase Intelligibility: Intelligibility reduced Motor planning: Appears intact Motor speech errors: aware Interfering components:  processing speed Effective technique: increased vocal intensity and pause  ORAL MOTOR EXAMINATION: Overall status: WFL Comments:    OBJECTIVE VOICE ASSESSMENT: Sustained "ah" maximum phonation time: 11.11 seconds Sustained "ah" loudness average: 73 dB Oral reading (passage) loudness average: 68-77 dB Oral reading loudness range: 64 to 72 dB Conversational loudness average: 68 dB Conversational loudness range: 66-72 dB Voice quality: hoarse, rough, and low vocal intensity Stimulability  trials: Given SLP modeling and occasional mod cues, loudness average increased to 76dB (range of 74 to 82) at reading simple sentence level  Comments: some improvement in hoarseness occasionally with increased vocal intenstiy  Completed audio recording of patients baseline voice without cueing from SLP: Yes  Pt does not report difficulty with swallowing which does not warrant further evaluation.  PATIENT REPORTED OUTCOME MEASURES (PROM): Communication Participation Item Bank: 14/30 The Communicative Participation Item Bank        Does your condition interfere with... Pt Rating   ...talking with people you know 2   ...communicating when you need to say something quickly 1   ...talking with people you do not know 2   ...communicating when you are out in your community 2   ...asking questions in a conversation 1   ....communicating in a small group of people 1   ...having a long conversation 1   ...giving detailed infomrmation 2   ...getting your turn in a fast moving conversation 1   ...trying to persuade a friend or family member to see a different point of view 1  3= Not at all; 2=A little; 1=Quite a bit; 0=Very much                                                                                                                            TREATMENT DATE:  03/29/23 (eval completed): Initiated training in compensations for dysarthria by speaking with intent - he required frequent modeling and verbal cues to read conversational sentences with intent to improve voice quality and articulation. Introduced [employer] and demonstrated where to find the required [employer]. Ordered Speak Out! Work book. Initiated training in compensations for slow processing to improve participation in conversations with family members.  See Patient Instructions. Introduced Careers adviser for word finding by modeling  3 examples.  PATIENT EDUCATION: Education details: See Treatment note, See  Patient Instructions, HEP for dysarthria, compensations for slow processing, compensations for dysarthria Person educated: Patient Education method: Explanation, Demonstration, Verbal cues, and Handouts Education comprehension: verbalized understanding, returned demonstration, verbal cues required, and needs further education  HOME EXERCISE PROGRAM: Speak Out!    GOALS: Goals reviewed with patient? Yes  SHORT TERM GOALS: Target date: 04/26/23  Pt will complete HEP for dysarthria with occasional min A Baseline: Goal status: INITIAL  2.  Pt will complete at least 1 on line home practice session on the Parkinson [employer] website Baseline:  Goal status: INITIAL  3.  Pt will complete moderately complex naming task with 90% accuracy and occasional min A Baseline:  Goal status: INITIAL  4.  Pt will average 72dB 18/20 sentences with rare min A Baseline:  Goal status: INITIAL  5.  Pt will employ verbal compensations for word finding in structured tasks 3/5 opportunities with occasional min A Baseline:  Goal status: INITIAL   LONG TERM GOALS: Target date: 05/24/23  Pt will complete HEP for dysarthria with mod I Baseline:  Goal status: INITIAL  2.  Pt will complete HEP for dysarthria 2x a day 5/7 days over 1 week Baseline:  Goal status: INITIAL  3.  Pt will access on line eLibrary for home practice 2x with rare min A Baseline:  Goal status: INITIAL  4.  Pt & family will carryover 3 compensatory strategies to support auditory comprehension/slow processing in group and phone conversations Baseline:  Goal status: INITIAL  5.  Pt will average 70dB over 15 minute conversation with rare min A Baseline:  Goal status: INITIAL  6.  Pt will improve score on Communication Participation Item Bank by 2 points Baseline: 14 Goal status: INITIAL  ASSESSMENT:  CLINICAL IMPRESSION: Patient is a 78 y.o. male who was seen today for mild expressive language impairment and mild  hypokinetic dysarthria. Dysarthria exacerbate by recent CVA and Nadine Counts feels his speech is more "jumpy" and "slurred" since the CVA. He endorses that hoarseness is due to PD. He endorses difficulty speaking with his daughters who "try to fill in what he is saying" when he pauses for processing and word finding - he finds this frustrating. He endorses communication breakdowns with spouse, especially at a distance or when completing a task. .I recommend skilled ST to maximize communication for safety, independence and QOL.    OBJECTIVE IMPAIRMENTS: Objective impairments include expressive language and dysarthria. These impairments are limiting patient from effectively communicating at home and in community.Factors affecting potential to achieve goals and functional outcome are co-morbidities.. Patient will benefit from skilled SLP services to address above impairments and improve overall function.  REHAB POTENTIAL: Good  PLAN:  SLP FREQUENCY: 2x/week  SLP DURATION: 8 weeks  PLANNED INTERVENTIONS: Aspiration precaution training, Diet toleration management , Language facilitation, Environmental controls, Cueing hierachy, Cognitive reorganization, Internal/external aids, Functional tasks, Multimodal communication approach, SLP instruction and feedback, Compensatory strategies, and Patient/family education, MBSS if indicated    Jathniel Smeltzer, Radene Journey, CCC-SLP 03/29/2023, 12:12 PM

## 2023-03-31 ENCOUNTER — Ambulatory Visit: Payer: Medicare HMO | Admitting: Cardiology

## 2023-03-31 ENCOUNTER — Ambulatory Visit: Payer: Medicare HMO

## 2023-04-01 ENCOUNTER — Ambulatory Visit: Payer: Medicare HMO | Admitting: Occupational Therapy

## 2023-04-05 ENCOUNTER — Ambulatory Visit (INDEPENDENT_AMBULATORY_CARE_PROVIDER_SITE_OTHER): Payer: Medicare HMO | Admitting: Neurology

## 2023-04-05 ENCOUNTER — Encounter: Payer: Medicare HMO | Admitting: Neurology

## 2023-04-05 ENCOUNTER — Encounter: Payer: Self-pay | Admitting: Neurology

## 2023-04-05 DIAGNOSIS — G5601 Carpal tunnel syndrome, right upper limb: Secondary | ICD-10-CM

## 2023-04-05 DIAGNOSIS — R202 Paresthesia of skin: Secondary | ICD-10-CM | POA: Diagnosis not present

## 2023-04-05 NOTE — Procedures (Signed)
 [facility]  [address] [place], [address]  [place], [place] [address] Tel: [phone] Fax: [phone] Test Date:  04/05/2023  Patient: Joshua [name] DOB: October 28, 1945 Physician: Jacquelyne Balint, MD  Sex: Male Height: 6\' 1"  Ref Phys: Kerin Salen, DO  ID#: [id number]   Technician:    History: This is a 78 year old male with wrist and finger extensor weakness and arm numbness.  NCV & EMG Findings: Extensive electrodiagnostic evaluation of the right upper limb shows: Right median sensory response is absent. Right ulnar and radial sensory responses are within normal limits. Right median (APB) motor response is absent. Right ulnar (ADM), right radial (EIP), and right radial (EDC) motor responses are within normal limits. Chronic motor axon loss changes with accompanying active denervation changes are seen in the right abductor pollicis brevis muscle. All other tested muscles are within normal limits.  Neuromuscular Ultrasound Findings: High frequency (4.0-16.0 MHz) B-mode, nonvascular ultrasound of the right upper limb shows increased cross sectional area (CSA) of the right median nerve at the wrist with wrist to forearm ratio increased to 1.99.  Impression: This is an abnormal study. 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 No electrodiagnostic evidence of a right radial mononeuropathy. No electrodiagnostic evidence of a right cervical (C5-T1) motor radiculopathy.   ___________________________ Jacquelyne Balint, MD    NCS+ Motor Nerve Results    Latency Amplitude F-Lat Segment Distance CV Comment  Site (ms) Norm (mV) Norm (ms)  (cm) (m/s) Norm   Right Median (APB) Motor  Wrist *NR  < 4.0 *NR  > 5.0        Elbow *NR - *NR -  Elbow-Wrist - *NR  > 50   Right Radial (ED) Motor  Forearm 2.1  < 3.1 7.9  > 5.0        Upper arm 4.6 - 7.1 -   Upper arm-Forearm 18 72  > 50   Right Radial (EIP) Motor  Forearm 2.9  < 2.9 4.1  > 2.0        Upper arm 6.0 - 3.7 -  Upper arm-Forearm 20 65  > 49   Right Ulnar (ADM) Motor  Wrist 1.78  < 3.1 8.8  > 7.0        Bel elbow 5.8 - 8.8 -  Bel elbow-Wrist 24 60  > 50   Ab elbow 7.3 - 8.4 -  Ab elbow-Bel elbow 10 67 -    Sensory Sites    Neg Peak Lat Amplitude (O-P) Segment Distance Velocity Comment  Site (ms) Norm (V) Norm  (cm) (ms)   Right Median Sensory  Wrist-Dig II *NR  < 3.8 *NR  > 10 Wrist-Dig II 13    Right Radial Sensory  Forearm-Wrist 2.2  < 2.8 12  > 10 Forearm-Wrist 10    Right Ulnar Sensory  Wrist-Dig V 2.8  < 3.2 16  > 5 Wrist-Dig V 11     EMG+   Side Muscle Ins.Act Fibs Fasc Recrt Amp Dur Poly Activation Comment  Right FDI Nml Nml Nml Nml Nml Nml Nml Nml N/A  Right EIP Nml Nml Nml Nml Nml Nml Nml Nml N/A  Right APB Nml *1+ Nml *2- *1+ *1+ *1+ Nml N/A  Right Pronator teres Nml Nml Nml Nml Nml Nml Nml Nml N/A  Right FPL Nml Nml Nml Nml Nml Nml Nml Nml N/A  Right  Biceps Nml Nml Nml Nml Nml Nml Nml Nml N/A  Right Triceps Nml Nml Nml Nml Nml Nml Nml Nml N/A  Right Deltoid Nml Nml Nml Nml Nml Nml Nml Nml N/A   Nerve Measurements   Site Area Segment Area Ratio Mobility Vascularity Comment   mm Norm   Norm     Right Median  Wrist *16.5  < 13.0         Forearm 8.3  < 10.7  Wrist - Forearm *1.99  < 1.50          Waveforms:  Motor           Sensory

## 2023-04-05 NOTE — Therapy (Unsigned)
 OUTPATIENT SPEECH LANGUAGE PATHOLOGY PARKINSON'S TREATMENT   Patient Name: Joshua [name] MRN: [id number] DOB:03-31-1945, 78 y.o., male Today's Date: 04/06/2023  PCP: Cleatis Polka, MD REFERRING PROVIDER: Vladimir Faster, DO  END OF SESSION:  End of Session - 04/06/23 1101     Visit Number 2    Number of Visits 17    Date for SLP Re-Evaluation 05/24/23    SLP Start Time 1101    SLP Stop Time  1145    SLP Time Calculation (min) 44 min    Activity Tolerance Patient tolerated treatment well              Past Medical History:  Diagnosis Date   Allergic rhinitis    Anxiety    from chronic pain from surgery- on Cymbalta   Arthritis    Asthma    Barrett's esophagus 03/29/2014   Carotid artery disease (HCC)    Carotid Doppler normal August, 2007   Coronary artery disease    Diverticulosis    Dyslipidemia    GERD (gastroesophageal reflux disease)    History of loop recorder    has since 04/04/15   HTN (hypertension)    takes Metoprolol for PVC control   Hx of colonic polyps    adenomatous   IFG (impaired fasting glucose)    Myocardial infarction (HCC)    mild   Neuromuscular disorder (HCC)    parkinson's tremors in hands   Palpitations    Benign PVCs   Parkinson disease (HCC)    Pneumonia    Prostate cancer (HCC)    RBBB (right bundle branch block)    rate related   Shingles    Stroke (HCC) 2017   TIA (transient ischemic attack) 02/2015   Per pt, had 2 strokes   Vertigo    Past Surgical History:  Procedure Laterality Date   BACK SURGERY  2002,2009   x 6   CHOLECYSTECTOMY  11/30/2012   with IOC   COLONOSCOPY     ELECTROPHYSIOLOGIC STUDY N/A 09/26/2015   Procedure: V Tach Ablation (PVC);  Surgeon: Will Jorja Loa, MD;  Location: [facility];  Service: Cardiovascular;  Laterality: N/A;   EP IMPLANTABLE DEVICE N/A 04/04/2015   Procedure: Loop Recorder Insertion;  Surgeon: Will Jorja Loa, MD;  Location: [facility];   Service: Cardiovascular;  Laterality: N/A;   HERNIA REPAIR     laprascopic   KNEE SURGERY     DECEMBER 2017,LEFT KNEE SCOPED   LEFT HEART CATH AND CORONARY ANGIOGRAPHY N/A 02/05/2021   Procedure: LEFT HEART CATH AND CORONARY ANGIOGRAPHY;  Surgeon: Iran Ouch, MD;  Location: [facility];  Service: Cardiovascular;  Laterality: N/A;   NECK SURGERY  2002   POLYPECTOMY     ROTATOR CUFF REPAIR Left    TEE WITHOUT CARDIOVERSION N/A 04/04/2015   Procedure: TRANSESOPHAGEAL ECHOCARDIOGRAM (TEE);  Surgeon: Lewayne Bunting, MD;  Location: [facility];  Service: Cardiovascular;  Laterality: N/A;   TOTAL KNEE ARTHROPLASTY Left 09/28/2022   Procedure: TOTAL KNEE ARTHROPLASTY;  Surgeon: Joen Laura, MD;  Location: [facility];  Service: Orthopedics;  Laterality: Left;   TOTAL KNEE ARTHROPLASTY Right 01/11/2023   Procedure: TOTAL KNEE ARTHROPLASTY;  Surgeon: Joen Laura, MD;  Location: [facility];  Service: Orthopedics;  Laterality: Right;   TRIGGER FINGER RELEASE Left 12/28/2019   Procedure: RELEASE TRIGGER FINGER/A-1 PULLEY THUMB, MIDDLE AND RING;  Surgeon: Cindee Salt, MD;  Location:  SURGERY CENTER;  Service: Orthopedics;  Laterality: Left;  FAB   UPPER GASTROINTESTINAL ENDOSCOPY     V Tach ablation  09/26/2015   Patient Active Problem List   Diagnosis Date Noted   CVA (cerebral vascular accident) (HCC) 01/15/2023   Primary osteoarthritis of right knee 01/11/2023   Localized osteoarthritis of right knee 01/11/2023   Primary osteoarthritis of left knee 09/28/2022   SBO (small bowel obstruction) (HCC) 05/29/2022   Chest pain 05/26/2022   Epigastric abdominal pain 05/26/2022   Coronary artery disease involving native coronary artery of native heart without angina pectoris 05/28/2021   History of myocardial infarction 02/05/2021   Parkinson's disease (HCC)    Malignant neoplasm of prostate (HCC) 11/01/2017   Essential tremor 12/06/2015   PVC (premature ventricular  contraction) 09/26/2015   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) 04/07/2015   Acute CVA (cerebrovascular accident) (HCC) 03/06/2015   Stroke (cerebrum) (HCC) 03/05/2015   Cerebral infarction (HCC) 03/05/2015   Weakness of right upper extremity    Tremor    Ejection fraction    Vertigo    Sleep apnea    Palpitations    Hyperlipidemia with target LDL less than 70    GERD (gastroesophageal reflux disease)    HTN (hypertension)    Chest pain    RBBB (right bundle branch block)    Ejection fraction    Carotid artery disease (HCC)     ONSET DATE: 03/01/2023 (Date of referral); 01/11/2023 date of stroke and RTKA, Dx PD 2017  REFERRING DIAG: G20.A1 (ICD-10-CM) - Parkinson's disease without dyskinesia or fluctuating manifestations (HCC) I63.89 (ICD-10-CM) - Cerebral infarction due to other mechanism  THERAPY DIAG:  Dysarthria and anarthria  Expressive language impairment  Rationale for Evaluation and Treatment: Rehabilitation  SUBJECTIVE:   SUBJECTIVE STATEMENT: "I got my book and we watched the videos" Pt accompanied by: self  PERTINENT HISTORY:   Patient went to the hospital for right knee replacement January 11, 2023.  Unfortunately, patient woke up from anesthesia with right-sided weakness.  MRI brain demonstrated acute infarct in the left precentral and middle frontal gyrus.  Patient was seen by Dr. Pearlean Brownie.  Patient was obviously off of the Plavix for the knee replacement.  His LDL was only 23.  His left ventricular ejection fraction was 50 to 55%.  CTA of the head and neck demonstrated no significant stenosis of the carotids.  There is mild to moderate left vertebral artery stenosis.  Patient subsequently went to inpatient rehab, primarily for the expressive aphasia and mild right hemiparesis. PMH + PD which was dx in 2017 - he has never received ST for PD.   PAIN:  Are you having pain? No  FALLS: Has patient fallen in last 6 months?  See PT evaluation for  details - received PT for TKR at another clinic. No PT here at this time.   LIVING ENVIRONMENT: Lives with: lives with their spouse Lives in: House/apartment  PLOF:  Level of assistance: Independent with ADLs, Independent with IADLs Employment: Retired  PATIENT GOALS: "I want to work on improving my speech and pronunciation"  OBJECTIVE:  Note: Objective measures were completed at Evaluation unless otherwise noted.  DIAGNOSTIC FINDINGS: Acute infarct of the left precentral gyrus and middle frontal gyrus with petechial hemorrhage at the infarct site. No mass effect or midline shift.  TREATMENT DATE: 04/06/23: Received personal Speak Out! Workbook yesterday. Reported completion of online practice yesterday as well. Watched PD videos with some benefit to aid understanding of program. Re-educated rationale for Speak Out! Program and principles in light of PD and recent stroke impacting volume and speech intelligibility. Pt and wife verbalized understanding. Completed Lesson #2 today. ST leads pt through exercises providing usual model prior to pt execution. occasional min-A required to achieve target dB this date. Averages this date:  Sustained "ah" 83 dB Reading 76 dB Conversation speech task 73 dB.  Conversational sample of approx 5 minutes, pt averages 71 dB with occasional min-A to maintain targeted intensity. Provided PD wallet card as pt still occasionally driving. Briefly reviewed anomia strategies, with pt reporting use of extra time and circumlocution when word finding episodes occur. Reportedly has instructed family members to allow him extra time and not provide targeted word.   03/29/23 (eval completed): Initiated training in compensations for dysarthria by speaking with intent - he required frequent modeling and verbal cues to read conversational sentences with  intent to improve voice quality and articulation. Introduced [employer] and demonstrated where to find the required [employer]. Ordered Speak Out! Work book. Initiated training in compensations for slow processing to improve participation in conversations with family members. See Patient Instructions. Introduced Careers adviser for word finding by modeling  3 examples.  PATIENT EDUCATION: Education details: See Treatment note, See Patient Instructions, HEP for dysarthria, compensations for slow processing, compensations for dysarthria Person educated: Patient Education method: Explanation, Demonstration, Verbal cues, and Handouts Education comprehension: verbalized understanding, returned demonstration, verbal cues required, and needs further education  HOME EXERCISE PROGRAM: Speak Out!    GOALS: Goals reviewed with patient? Yes  SHORT TERM GOALS: Target date: 04/26/23  Pt will complete HEP for dysarthria with occasional min A Baseline: Goal status: IN PROGRESS  2.  Pt will complete at least 1 on line home practice session on the Parkinson [employer] website Baseline:  Goal status: MET  3.  Pt will complete moderately complex naming task with 90% accuracy and occasional min A Baseline:  Goal status: IN PROGRESS  4.  Pt will average 72dB 18/20 sentences with rare min A Baseline:  Goal status: MET  5.  Pt will employ verbal compensations for word finding in structured tasks 3/5 opportunities with occasional min A Baseline:  Goal status: IN PROGRESS   LONG TERM GOALS: Target date: 05/24/23  Pt will complete HEP for dysarthria with mod I Baseline:  Goal status: IN PROGRESS  2.  Pt will complete HEP for dysarthria 2x a day 5/7 days over 1 week Baseline:  Goal status: IN PROGRESS  3.  Pt will access on line eLibrary for home practice 2x with rare min A Baseline:  Goal status: IN PROGRESS  4.  Pt & family will carryover 3 compensatory strategies  to support auditory comprehension/slow processing in group and phone conversations Baseline:  Goal status: IN PROGRESS  5.  Pt will average 70dB over 15 minute conversation with rare min A Baseline:  Goal status: IN PROGRESS  6.  Pt will improve score on Communication Participation Item Bank by 2 points Baseline: 14 Goal status: IN PROGRESS  ASSESSMENT:  CLINICAL IMPRESSION: Patient is a 78 y.o. male who was seen today for mild expressive language impairment and mild hypokinetic dysarthria. Dysarthria exacerbate by recent CVA and Nadine Counts feels his speech is more "jumpy" and "slurred" since the CVA. He endorses that hoarseness is due  to PD. He endorses difficulty speaking with his daughters who "try to fill in what he is saying" when he pauses for processing and word finding - he finds this frustrating. He endorses communication breakdowns with spouse, especially at a distance or when completing a task. Initiated education and instruction of Speak Out! Program and principle of intent to maximize volume and speech clarity. I recommend skilled ST to maximize communication for safety, independence and QOL.    OBJECTIVE IMPAIRMENTS: Objective impairments include expressive language and dysarthria. These impairments are limiting patient from effectively communicating at home and in community.Factors affecting potential to achieve goals and functional outcome are co-morbidities.. Patient will benefit from skilled SLP services to address above impairments and improve overall function.  REHAB POTENTIAL: Good  PLAN:  SLP FREQUENCY: 2x/week  SLP DURATION: 8 weeks  PLANNED INTERVENTIONS: Aspiration precaution training, Diet toleration management , Language facilitation, Environmental controls, Cueing hierachy, Cognitive reorganization, Internal/external aids, Functional tasks, Multimodal communication approach, SLP instruction and feedback, Compensatory strategies, and Patient/family education, MBSS if  indicated    Gracy Racer, CCC-SLP 04/06/2023, 11:01 AM

## 2023-04-06 ENCOUNTER — Ambulatory Visit: Payer: Medicare HMO

## 2023-04-06 ENCOUNTER — Ambulatory Visit: Payer: Medicare HMO | Admitting: Occupational Therapy

## 2023-04-06 ENCOUNTER — Other Ambulatory Visit: Payer: Self-pay | Admitting: Student

## 2023-04-06 DIAGNOSIS — G20B1 Parkinson's disease with dyskinesia, without mention of fluctuations: Secondary | ICD-10-CM

## 2023-04-06 DIAGNOSIS — R29818 Other symptoms and signs involving the nervous system: Secondary | ICD-10-CM

## 2023-04-06 DIAGNOSIS — M5416 Radiculopathy, lumbar region: Secondary | ICD-10-CM

## 2023-04-06 DIAGNOSIS — R471 Dysarthria and anarthria: Secondary | ICD-10-CM

## 2023-04-06 DIAGNOSIS — R29898 Other symptoms and signs involving the musculoskeletal system: Secondary | ICD-10-CM

## 2023-04-06 DIAGNOSIS — R278 Other lack of coordination: Secondary | ICD-10-CM

## 2023-04-06 DIAGNOSIS — F801 Expressive language disorder: Secondary | ICD-10-CM

## 2023-04-06 DIAGNOSIS — G250 Essential tremor: Secondary | ICD-10-CM

## 2023-04-06 DIAGNOSIS — M6281 Muscle weakness (generalized): Secondary | ICD-10-CM

## 2023-04-06 DIAGNOSIS — I6300 Cerebral infarction due to thrombosis of unspecified precerebral artery: Secondary | ICD-10-CM

## 2023-04-06 NOTE — Patient Instructions (Signed)

## 2023-04-06 NOTE — Therapy (Unsigned)
 OUTPATIENT OCCUPATIONAL THERAPY NEURO TREATMENT  Patient Name: Joshua [name] MRN: [id number] DOB:Jul 12, 1945, 78 y.o., male Today's Date: 04/06/2023  PCP: [name], MD  REFERRING PROVIDER: Arbutus Leas Octaviano Batty, DO  END OF SESSION:  OT End of Session - 04/06/23 1022     Visit Number 5    Number of Visits 13    Date for OT Re-Evaluation 04/23/23    Authorization Type [employer]    OT Start Time 1022    OT Stop Time 1100    OT Time Calculation (min) 38 min    Activity Tolerance Patient tolerated treatment well    Behavior During Therapy WFL for tasks assessed/performed            Past Medical History:  Diagnosis Date   Allergic rhinitis    Anxiety    from chronic pain from surgery- on Cymbalta   Arthritis    Asthma    Barrett's esophagus 03/29/2014   Carotid artery disease (HCC)    Carotid Doppler normal August, 2007   Coronary artery disease    Diverticulosis    Dyslipidemia    GERD (gastroesophageal reflux disease)    History of loop recorder    has since 04/04/15   HTN (hypertension)    takes Metoprolol for PVC control   Hx of colonic polyps    adenomatous   IFG (impaired fasting glucose)    Myocardial infarction (HCC)    mild   Neuromuscular disorder (HCC)    parkinson's tremors in hands   Palpitations    Benign PVCs   Parkinson disease (HCC)    Pneumonia    Prostate cancer (HCC)    RBBB (right bundle branch block)    rate related   Shingles    Stroke (HCC) 2017   TIA (transient ischemic attack) 02/2015   Per pt, had 2 strokes   Vertigo    Past Surgical History:  Procedure Laterality Date   BACK SURGERY  2002,2009   x 6   CHOLECYSTECTOMY  11/30/2012   with IOC   COLONOSCOPY     ELECTROPHYSIOLOGIC STUDY N/A 09/26/2015   Procedure: V Tach Ablation (PVC);  Surgeon: Will Jorja Loa, MD;  Location: [facility];  Service: Cardiovascular;  Laterality: N/A;   EP IMPLANTABLE DEVICE N/A 04/04/2015   Procedure: Loop Recorder  Insertion;  Surgeon: Will Jorja Loa, MD;  Location: [facility];  Service: Cardiovascular;  Laterality: N/A;   HERNIA REPAIR     laprascopic   KNEE SURGERY     DECEMBER 2017,LEFT KNEE SCOPED   LEFT HEART CATH AND CORONARY ANGIOGRAPHY N/A 02/05/2021   Procedure: LEFT HEART CATH AND CORONARY ANGIOGRAPHY;  Surgeon: Iran Ouch, MD;  Location: [facility];  Service: Cardiovascular;  Laterality: N/A;   NECK SURGERY  2002   POLYPECTOMY     ROTATOR CUFF REPAIR Left    TEE WITHOUT CARDIOVERSION N/A 04/04/2015   Procedure: TRANSESOPHAGEAL ECHOCARDIOGRAM (TEE);  Surgeon: Lewayne Bunting, MD;  Location: [facility];  Service: Cardiovascular;  Laterality: N/A;   TOTAL KNEE ARTHROPLASTY Left 09/28/2022   Procedure: TOTAL KNEE ARTHROPLASTY;  Surgeon: Joen Laura, MD;  Location: [facility];  Service: Orthopedics;  Laterality: Left;   TOTAL KNEE ARTHROPLASTY Right 01/11/2023   Procedure: TOTAL KNEE ARTHROPLASTY;  Surgeon: Joen Laura, MD;  Location: [facility];  Service: Orthopedics;  Laterality: Right;   TRIGGER FINGER RELEASE Left 12/28/2019   Procedure: RELEASE TRIGGER FINGER/A-1 PULLEY THUMB, MIDDLE  AND RING;  Surgeon: Cindee Salt, MD;  Location: [facility];  Service: Orthopedics;  Laterality: Left;  FAB   UPPER GASTROINTESTINAL ENDOSCOPY     V Tach ablation  09/26/2015   Patient Active Problem List   Diagnosis Date Noted   CVA (cerebral vascular accident) (HCC) 01/15/2023   Primary osteoarthritis of right knee 01/11/2023   Localized osteoarthritis of right knee 01/11/2023   Primary osteoarthritis of left knee 09/28/2022   SBO (small bowel obstruction) (HCC) 05/29/2022   Chest pain 05/26/2022   Epigastric abdominal pain 05/26/2022   Coronary artery disease involving native coronary artery of native heart without angina pectoris 05/28/2021   History of myocardial infarction 02/05/2021   Parkinson's disease (HCC)    Malignant neoplasm of prostate  (HCC) 11/01/2017   Essential tremor 12/06/2015   PVC (premature ventricular contraction) 09/26/2015   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) 04/07/2015   Acute CVA (cerebrovascular accident) (HCC) 03/06/2015   Stroke (cerebrum) (HCC) 03/05/2015   Cerebral infarction (HCC) 03/05/2015   Weakness of right upper extremity    Tremor    Ejection fraction    Vertigo    Sleep apnea    Palpitations    Hyperlipidemia with target LDL less than 70    GERD (gastroesophageal reflux disease)    HTN (hypertension)    Chest pain    RBBB (right bundle branch block)    Ejection fraction    Carotid artery disease (HCC)     ONSET DATE: 03/01/2023 (Date of referral); 01/11/2023 date of stroke and RTKA  REFERRING DIAG:  G20.A1 (ICD-10-CM) - Parkinson's disease without dyskinesia or fluctuating manifestations (HCC)  I63.89 (ICD-10-CM) - Cerebral infarction due to other mechanism    THERAPY DIAG:  1. Other lack of coordination (Primary) 2. Muscle weakness (generalized) 3. Essential tremor 4. Other symptoms and signs involving the nervous system     Rationale for Evaluation and Treatment: Rehabilitation  SUBJECTIVE:   SUBJECTIVE STATEMENT:  Pt reports he had EMG with severe carpal tunnel   Pt accompanied by: self and wife [name]  PERTINENT HISTORY: PMH: Rt TKA 01/11/23 with subsequent acute Lt CVA affecting Rt side, PD, Anxiety, HTN, HLD, multiple back surgeries/fusion neck and lumbar spine,  NSTEMI, 01/2020, hx of multiple syncopal episodes, CVA in 2017   04/06/2023 Impression: This is an abnormal study. 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 No electrodiagnostic evidence of a right radial mononeuropathy. No electrodiagnostic evidence of a right cervical (C5-T1) motor radiculopathy.  PRECAUTIONS: Fall  WEIGHT BEARING  RESTRICTIONS: No  PAIN:  Are you having pain? Yes: NPRS scale: up to 2-8/10 Pain location: back Pain description: aching/burning Aggravating factors: bumped his back Relieving factors: rest, stretching, heat  FALLS: Has patient fallen in last 6 months? No  LIVING ENVIRONMENT: Lives with: lives with their spouse Lives in: House/apartment - Town Home Stairs: No Has following equipment at home: Single point cane, Environmental consultant - 4 wheeled, Grab bars, and transport chair Has a lift chair and a lift bed.   PLOF: Independent; Horticulturist, commercial; driving  PATIENT GOALS: reduce affects of tremor in RUE  OBJECTIVE:   HAND DOMINANCE: Right  ADLs: Overall ADLs: mod I except buttons (unable to do buttons), difficulty with smaller zippers Eating: setup but only 40% with Rt dominant hand w/ spills Grooming: brushing teeth using electric toothbrush using only Rt hand 40%  Equipment: Shower seat with back, Grab bars, Walk in shower, Reacher, Sock aid, Long handled shoe horn, and Long handled sponge  IADLs: Shopping: able to do light shopping with shopping cart Light housekeeping: cleans bathrooms minus the tubs and uses light weight vacuum cleaners Meal Prep: Mod I Community mobility: driving Medication management: mod I Financial management: 50/50 with wife Handwriting: 25% legible  MOBILITY STATUS: Needs Assist: uses SPC and Hx of falls  ACTIVITY TOLERANCE: Activity tolerance: fair  FUNCTIONAL OUTCOME MEASURES: To be assessed during future visits  UPPER EXTREMITY ROM:     AROM Right (eval) Left (eval)  Shoulder flexion WNL WNL  Shoulder abduction WNL WNL  Elbow flexion WNL WNL  Elbow extension WNL WNL  Wrist flexion BFL WNL  Wrist extension none WNL  Wrist pronation WFL WNL  Wrist supination WFL WNL   Digit Composite Flexion WFL WNL  Digit Composite Extension WNL WNL  Digit Opposition BFL WNL  (Blank rows = not tested)  UPPER EXTREMITY MMT:     MMT Right (eval)  Left (eval)  Shoulder flexion WFL WNL  Shoulder abduction WFL WNL  Elbow flexion WFL WNL  Elbow extension WFL WNL  (Blank rows = not tested)  HAND FUNCTION: Grip strength: Right: 20 lbs; Left:73 lbs  COORDINATION: 9 Hole Peg test: Right: unable to place any pegs in 60 sec (2 drops) - significant decline; Left: 44 sec - no change  Box and Blocks:  Right 22 blocks - significant decline, Left 37 blocks - no change Tremors: Right, Left, and right worse than left  Simulated Eating: Right - 20 seconds; Left (pt using this side since stroke, especially with high viscus foods) - 21 seconds   Buttons: 85 seconds - significant decline  SENSATION: Moderate to severe paresthesias in R hand and fingers primarily but extends up to shoulder, worse since stroke  EDEMA: mild arthritic changes to R wrist  MUSCLE TONE: RUE: Rigidity; mild  COGNITION: Overall cognitive status: Within functional limits for tasks assessed  VISION: Subjective report: Just got new glasses RX; limited changes Baseline vision: Wears glasses all the time; progressive lenses Visual history: n/a  OBSERVATIONS: Bradykinesia, Pt with worse tremors on Rt side, milder on Lt side. Decreased coordination Rt side from CVA w/ increase in PD symptoms.  Pt w/ h/o 7 back surgeries and 1 neck sx   TODAY'S TREATMENT:                                                                                                                              OT educated patient and spouse on sleep positioning as noted in patient instructions to reduce stress to upper extremity nerves, which could be attributing to reported paresthesias and pain in affected extremity. Patient verbalized understanding. Handout provided.  Reviewed recent imaging and bracing recommendations.    OT educated pt on table top play of [employer] for BUE ROM, coordination, visual processing, scanning, and sequencing. Pt required  moderate cues for proper play.    PATIENT EDUCATION: Education details: Sleep positioning; Manufacturing systems engineer Person educated: Patient and Spouse Education method: Explanation, Demonstration, Tactile cues, and Verbal cues Education comprehension: verbalized understanding, returned demonstration, verbal cues required, tactile cues required, and needs further education  HOME EXERCISE PROGRAM: 03/22/23: R Wrist ROM: Access Code: [email or web address] 03/24/23: Coordination HEP, strategies for tremors  GOALS:  SHORT TERM GOALS: Target date: 04/06/2023     Pt will be independent with PD specific HEP.  Baseline: Goal status: IN PROGRESS  2.  Pt will verbalize understanding of adapted strategies to maximize safety and I with ADLs/IADLs. Baseline:  Goal status: IN PROGRESS  3.  Pt will verbalize understanding of ways to prevent future PD related complications and PD community resources.  Baseline:  Goal status: INITIAL  4.  Pt will report use of R as dominant hand with feeding and show improvement to PPT #2 score.  Baseline: using LUE for eating and completed test in 20 seconds with R Goal status: IN PROGRESS   LONG TERM GOALS: Target date: 04/23/2023  Pt will demonstrate improved ease with fastening buttons as evidenced by decreasing 3 button/unbutton time by at least 16 seconds  Baseline: 86 seconds Goal status: INITIAL  2.  Patient will complete nine-hole peg with use of R in 120 seconds or less with no more than 1 drop. Baseline: Right: unable to place any pegs in 60 sec (2 drops) Goal status: INITIAL  3.  Patient will demonstrate at least 31 lbs R grip strength as needed to open jars and other containers. Baseline: Right: 20 lbs Goal status: INITIAL  4.  Pt will be able to place at least 30 blocks using right hand with completion of Box and Blocks test. Baseline: Right 22 blocks Goal status: INITIAL  ASSESSMENT:  CLINICAL IMPRESSION: Patient demonstrates good understanding of functional activity to encourage  components of current coordination HEP as needed to progress towards goals. Will monitor further work-up with respect to back, neck and RUE.   PERFORMANCE DEFICITS: in functional skills including ADLs, IADLs, coordination, strength, Fine motor control, Gross motor control, mobility, balance, and UE functional use.  IMPAIRMENTS: are limiting patient from ADLs, IADLs, and leisure.   COMORBIDITIES:  may have co-morbidities  that affects occupational performance. Patient will benefit from skilled OT to address above impairments and improve overall function.  REHAB POTENTIAL: Fair given diagnosis   PLAN:  OT FREQUENCY: 2x/week  OT DURATION: 6 weeks  PLANNED INTERVENTIONS: self care/ADL training, therapeutic exercise, therapeutic activity, neuromuscular re-education, manual therapy, passive range of motion, balance training, functional mobility training, splinting, electrical stimulation, ultrasound, paraffin, fluidotherapy, moist heat, patient/family education, cognitive remediation/compensation, visual/perceptual remediation/compensation, energy conservation, coping strategies training, DME and/or AE instructions, and Re-evaluation  RECOMMENDED OTHER SERVICES: none at this time  CONSULTED AND AGREED WITH PLAN OF CARE: Patient  PLAN FOR NEXT SESSION: any new tests? See ortho?  PWR! Moves modified prn d/t previous back surgeries/pain Pt to bring in binder/stack of exercises - PWR! Hands - sitting  Delana Meyer, OT 04/06/2023, 12:38 PM

## 2023-04-07 ENCOUNTER — Other Ambulatory Visit: Payer: Self-pay

## 2023-04-07 DIAGNOSIS — G5631 Lesion of radial nerve, right upper limb: Secondary | ICD-10-CM

## 2023-04-07 DIAGNOSIS — G5601 Carpal tunnel syndrome, right upper limb: Secondary | ICD-10-CM

## 2023-04-08 ENCOUNTER — Ambulatory Visit: Payer: Medicare HMO | Admitting: Occupational Therapy

## 2023-04-08 ENCOUNTER — Encounter: Payer: Self-pay | Admitting: Occupational Therapy

## 2023-04-08 DIAGNOSIS — R278 Other lack of coordination: Secondary | ICD-10-CM

## 2023-04-08 DIAGNOSIS — R293 Abnormal posture: Secondary | ICD-10-CM

## 2023-04-08 DIAGNOSIS — R2681 Unsteadiness on feet: Secondary | ICD-10-CM

## 2023-04-08 DIAGNOSIS — M6281 Muscle weakness (generalized): Secondary | ICD-10-CM

## 2023-04-08 DIAGNOSIS — R29818 Other symptoms and signs involving the nervous system: Secondary | ICD-10-CM

## 2023-04-08 DIAGNOSIS — R29898 Other symptoms and signs involving the musculoskeletal system: Secondary | ICD-10-CM

## 2023-04-08 NOTE — Therapy (Signed)
 OUTPATIENT OCCUPATIONAL THERAPY NEURO TREATMENT  Patient Name: Joshua [name] MRN: [id number] DOB:1945-11-12, 78 y.o., male Today's Date: 04/08/2023  PCP: [name], MD  REFERRING PROVIDER: Arbutus Leas Octaviano Batty, DO  END OF SESSION:  OT End of Session - 04/08/23 1107     Visit Number 6    Number of Visits 13    Date for OT Re-Evaluation 04/23/23    Authorization Type [employer]    OT Start Time 1105    OT Stop Time 1145    OT Time Calculation (min) 40 min    Activity Tolerance Patient tolerated treatment well    Behavior During Therapy [facility] for tasks assessed/performed            Past Medical History:  Diagnosis Date   Allergic rhinitis    Anxiety    from chronic pain from surgery- on Cymbalta   Arthritis    Asthma    Barrett's esophagus 03/29/2014   Carotid artery disease (HCC)    Carotid Doppler normal August, 2007   Coronary artery disease    Diverticulosis    Dyslipidemia    GERD (gastroesophageal reflux disease)    History of loop recorder    has since 04/04/15   HTN (hypertension)    takes Metoprolol for PVC control   Hx of colonic polyps    adenomatous   IFG (impaired fasting glucose)    Myocardial infarction (HCC)    mild   Neuromuscular disorder (HCC)    parkinson's tremors in hands   Palpitations    Benign PVCs   Parkinson disease (HCC)    Pneumonia    Prostate cancer (HCC)    RBBB (right bundle branch block)    rate related   Shingles    Stroke (HCC) 2017   TIA (transient ischemic attack) 02/2015   Per pt, had 2 strokes   Vertigo    Past Surgical History:  Procedure Laterality Date   BACK SURGERY  2002,2009   x 6   CHOLECYSTECTOMY  11/30/2012   with IOC   COLONOSCOPY     ELECTROPHYSIOLOGIC STUDY N/A 09/26/2015   Procedure: V Tach Ablation (PVC);  Surgeon: Will Jorja Loa, MD;  Location: [facility];  Service: Cardiovascular;  Laterality: N/A;   EP IMPLANTABLE DEVICE N/A 04/04/2015   Procedure: Loop Recorder  Insertion;  Surgeon: Will Jorja Loa, MD;  Location: [facility];  Service: Cardiovascular;  Laterality: N/A;   HERNIA REPAIR     laprascopic   KNEE SURGERY     DECEMBER 2017,LEFT KNEE SCOPED   LEFT HEART CATH AND CORONARY ANGIOGRAPHY N/A 02/05/2021   Procedure: LEFT HEART CATH AND CORONARY ANGIOGRAPHY;  Surgeon: Iran Ouch, MD;  Location: [facility];  Service: Cardiovascular;  Laterality: N/A;   NECK SURGERY  2002   POLYPECTOMY     ROTATOR CUFF REPAIR Left    TEE WITHOUT CARDIOVERSION N/A 04/04/2015   Procedure: TRANSESOPHAGEAL ECHOCARDIOGRAM (TEE);  Surgeon: Lewayne Bunting, MD;  Location: [facility] ENDOSCOPY;  Service: Cardiovascular;  Laterality: N/A;   TOTAL KNEE ARTHROPLASTY Left 09/28/2022   Procedure: TOTAL KNEE ARTHROPLASTY;  Surgeon: Joen Laura, MD;  Location: [facility] ORS;  Service: Orthopedics;  Laterality: Left;   TOTAL KNEE ARTHROPLASTY Right 01/11/2023   Procedure: TOTAL KNEE ARTHROPLASTY;  Surgeon: Joen Laura, MD;  Location: [facility];  Service: Orthopedics;  Laterality: Right;   TRIGGER FINGER RELEASE Left 12/28/2019   Procedure: RELEASE TRIGGER FINGER/A-1 PULLEY THUMB, MIDDLE  AND RING;  Surgeon: Cindee Salt, MD;  Location: [facility];  Service: Orthopedics;  Laterality: Left;  FAB   UPPER GASTROINTESTINAL ENDOSCOPY     V Tach ablation  09/26/2015   Patient Active Problem List   Diagnosis Date Noted   CVA (cerebral vascular accident) (HCC) 01/15/2023   Primary osteoarthritis of right knee 01/11/2023   Localized osteoarthritis of right knee 01/11/2023   Primary osteoarthritis of left knee 09/28/2022   SBO (small bowel obstruction) (HCC) 05/29/2022   Chest pain 05/26/2022   Epigastric abdominal pain 05/26/2022   Coronary artery disease involving native coronary artery of native heart without angina pectoris 05/28/2021   History of myocardial infarction 02/05/2021   Parkinson's disease (HCC)    Malignant neoplasm of prostate  (HCC) 11/01/2017   Essential tremor 12/06/2015   PVC (premature ventricular contraction) 09/26/2015   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) 04/07/2015   Acute CVA (cerebrovascular accident) (HCC) 03/06/2015   Stroke (cerebrum) (HCC) 03/05/2015   Cerebral infarction (HCC) 03/05/2015   Weakness of right upper extremity    Tremor    Ejection fraction    Vertigo    Sleep apnea    Palpitations    Hyperlipidemia with target LDL less than 70    GERD (gastroesophageal reflux disease)    HTN (hypertension)    Chest pain    RBBB (right bundle branch block)    Ejection fraction    Carotid artery disease (HCC)     ONSET DATE: 03/01/2023 (Date of referral); 01/11/2023 date of stroke and RTKA  REFERRING DIAG:  G20.A1 (ICD-10-CM) - Parkinson's disease without dyskinesia or fluctuating manifestations (HCC)  I63.89 (ICD-10-CM) - Cerebral infarction due to other mechanism    THERAPY DIAG:  1. Other lack of coordination (Primary) 2. Muscle weakness (generalized) 3. Essential tremor 4. Other symptoms and signs involving the nervous system     Rationale for Evaluation and Treatment: Rehabilitation  SUBJECTIVE:   SUBJECTIVE STATEMENT:  Xray of back looks good but I'm pushing for an MRI. Pain in back 3/10 today. But it was bad for awhile last week. New CTS diagnosis Rt hand  Pt accompanied by: self and wife [name]  PERTINENT HISTORY: PMH: Rt TKA 01/11/23 with subsequent acute Lt CVA affecting Rt side, PD, Anxiety, HTN, HLD, multiple back surgeries/fusion neck and lumbar spine,  NSTEMI, 01/2020, hx of multiple syncopal episodes, CVA in 2017   04/06/2023 Impression: This is an abnormal study. 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 No electrodiagnostic evidence of a right radial mononeuropathy. No electrodiagnostic evidence of a  right cervical (C5-T1) motor radiculopathy.  PRECAUTIONS: Fall  WEIGHT BEARING RESTRICTIONS: No  PAIN:  Are you having pain? Yes: NPRS scale: up to 2-3/10 Pain location: back Pain description: aching/burning Aggravating factors: bumped his back Relieving factors: rest, stretching, heat  FALLS: Has patient fallen in last 6 months? No  LIVING ENVIRONMENT: Lives with: lives with their spouse Lives in: House/apartment - Town Home Stairs: No Has following equipment at home: Single point cane, Environmental consultant - 4 wheeled, Grab bars, and transport chair Has a lift chair and a lift bed.   PLOF: Independent; Horticulturist, commercial; driving  PATIENT GOALS: reduce affects of tremor in RUE  OBJECTIVE:   HAND DOMINANCE: Right  ADLs: Overall ADLs: mod I except buttons (unable to do buttons), difficulty with smaller zippers Eating: setup but  only 40% with Rt dominant hand w/ spills Grooming: brushing teeth using electric toothbrush using only Rt hand 40% Equipment: Shower seat with back, Grab bars, Walk in shower, Reacher, Sock aid, Long handled shoe horn, and Long handled sponge  IADLs: Shopping: able to do light shopping with shopping cart Light housekeeping: cleans bathrooms minus the tubs and uses light weight vacuum cleaners Meal Prep: Mod I Community mobility: driving Medication management: mod I Financial management: 50/50 with wife Handwriting: 25% legible  MOBILITY STATUS: Needs Assist: uses SPC and Hx of falls  ACTIVITY TOLERANCE: Activity tolerance: fair  FUNCTIONAL OUTCOME MEASURES: To be assessed during future visits  UPPER EXTREMITY ROM:     AROM Right (eval) Left (eval)  Shoulder flexion WNL WNL  Shoulder abduction WNL WNL  Elbow flexion WNL WNL  Elbow extension WNL WNL  Wrist flexion BFL WNL  Wrist extension none WNL  Wrist pronation WFL WNL  Wrist supination WFL WNL   Digit Composite Flexion WFL WNL  Digit Composite Extension WNL WNL  Digit Opposition BFL  WNL  (Blank rows = not tested)  UPPER EXTREMITY MMT:     MMT Right (eval) Left (eval)  Shoulder flexion WFL WNL  Shoulder abduction WFL WNL  Elbow flexion WFL WNL  Elbow extension WFL WNL  (Blank rows = not tested)  HAND FUNCTION: Grip strength: Right: 20 lbs; Left:73 lbs  COORDINATION: 9 Hole Peg test: Right: unable to place any pegs in 60 sec (2 drops) - significant decline; Left: 44 sec - no change  Box and Blocks:  Right 22 blocks - significant decline, Left 37 blocks - no change Tremors: Right, Left, and right worse than left  Simulated Eating: Right - 20 seconds; Left (pt using this side since stroke, especially with high viscus foods) - 21 seconds   Buttons: 85 seconds - significant decline  SENSATION: Moderate to severe paresthesias in R hand and fingers primarily but extends up to shoulder, worse since stroke  EDEMA: mild arthritic changes to R wrist  MUSCLE TONE: RUE: Rigidity; mild  COGNITION: Overall cognitive status: Within functional limits for tasks assessed  VISION: Subjective report: Just got new glasses RX; limited changes Baseline vision: Wears glasses all the time; progressive lenses Visual history: n/a  OBSERVATIONS: Bradykinesia, Pt with worse tremors on Rt side, milder on Lt side. Decreased coordination Rt side from CVA w/ increase in PD symptoms.  Pt w/ h/o 7 back surgeries and 1 neck sx   TODAY'S TREATMENT:                                                                                                                               Pt issued PWR! Hands and PWR! Seated today. Pt modified PWR! [address] and [employer]! Push hands  on Rt side d/t decreased wrist ext but instructed to keep elbow straight and hand open. Pt also with modifications applied to PWR! Twist seated d/t previous back surgeries and recent  pain,but instructed to stop this ex if back becomes painful - pt agreed.   Pt also shown prayer stretch to improve Rt wrist extension as pt is very  limited - ? D/t spasticity from CVA  UBE x 5 min, level 3 for normal reciprocal movement pattern and UB conditioning, keeping RPM > 40  Pt demo difficulty at end of session placing cap on head Rt hand d/t grabbing at very end with fingertips. Pt cued to grab bill of cap big with Rt hand and had better success 2nd attempt.   Pt also with difficulty donning jacket - will practice more next session  PATIENT EDUCATION: Education details: PWR! Hands, PWR! Sitting (modified prn), prayer stretch for Rt wrist ext Person educated: Patient and Spouse Education method: Explanation, Demonstration, Tactile cues, and Verbal cues Education comprehension: verbalized understanding, returned demonstration, verbal cues required, tactile cues required, and needs further education  HOME EXERCISE PROGRAM: 03/22/23: R Wrist ROM: Access Code: [email or web address] 03/24/23: Coordination HEP, strategies for tremors 04/08/23: PWR! Hands, PWR! Sitting (modified prn), prayer stretch for Rt wrist ext  GOALS:  SHORT TERM GOALS: Target date: 04/06/2023     Pt will be independent with PD specific HEP.  Baseline: Goal status: IN PROGRESS  2.  Pt will verbalize understanding of adapted strategies to maximize safety and I with ADLs/IADLs. Baseline:  Goal status: IN PROGRESS  3.  Pt will verbalize understanding of ways to prevent future PD related complications and PD community resources.  Baseline:  Goal status: INITIAL  4.  Pt will report use of R as dominant hand with feeding and show improvement to PPT #2 score.  Baseline: using LUE for eating and completed test in 20 seconds with R Goal status: IN PROGRESS   LONG TERM GOALS: Target date: 04/23/2023  Pt will demonstrate improved ease with fastening buttons as evidenced by decreasing 3 button/unbutton time by at least 16 seconds  Baseline: 86 seconds Goal status: INITIAL  2.  Patient will complete nine-hole peg with use of R in 120 seconds or less with no more than 1  drop. Baseline: Right: unable to place any pegs in 60 sec (2 drops) Goal status: INITIAL  3.  Patient will demonstrate at least 31 lbs R grip strength as needed to open jars and other containers. Baseline: Right: 20 lbs Goal status: INITIAL  4.  Pt will be able to place at least 30 blocks using right hand with completion of Box and Blocks test. Baseline: Right 22 blocks Goal status: INITIAL  ASSESSMENT:  CLINICAL IMPRESSION: Patient demonstrates good understanding of PWR! Modifications needed for back and Rt wrist today. Pt w/ newly diagnosed CTS Rt hand. Pt will continue to benefit from O.T. to address deficits from PD compounded by deficits from CVA and CTS.   PERFORMANCE DEFICITS: in functional skills including ADLs, IADLs, coordination, strength, Fine motor control, Gross motor control, mobility, balance, and UE functional use.  IMPAIRMENTS: are limiting patient from ADLs, IADLs, and leisure.   COMORBIDITIES:  may have co-morbidities  that affects occupational performance. Patient will benefit from skilled OT to address above impairments and improve overall function.  REHAB POTENTIAL: Fair given diagnosis   PLAN:  OT FREQUENCY: 2x/week  OT DURATION: 6 weeks  PLANNED INTERVENTIONS: self care/ADL training, therapeutic exercise, therapeutic activity, neuromuscular re-education, manual therapy, passive range of motion, balance training, functional mobility training, splinting, electrical stimulation, ultrasound, paraffin, fluidotherapy, moist heat, patient/family education, cognitive remediation/compensation, visual/perceptual remediation/compensation, energy conservation, coping strategies training, DME and/or AE  instructions, and Re-evaluation  RECOMMENDED OTHER SERVICES: none at this time  CONSULTED AND AGREED WITH PLAN OF CARE: Patient  PLAN FOR NEXT SESSION: any new tests? See ortho?  Practice jacket, ADL strategies, pt may also benefit from [facility]! Supine (w/ modifications  prn for back)  Sheran Lawless, OT 04/08/2023, 11:07 AM

## 2023-04-08 NOTE — Patient Instructions (Addendum)
 PWR! Hand Exercises Perform each exercise at least 10 repetitions 1x/day, and PWR! PUSH throughout the day when you are having trouble using your hands (picking up small objects, writing, eating, typing, buttoning, etc.). ** Make each movement big and deliberate; feel the movement.  PWR! UP: Fists to open fingers BIG  PWR! Rock:  Move wrists up and down [employer]! Twist: Twist palms up and down BIG  PWR! Step: Step your thumb to index finger while keeping other fingers straight. Flick fingers out BIG (thumb out/straighten fingers). Repeat with other fingers.  PWR! PUSH: Push hands out BIG. Elbows straight, wrists up, fingers open and spread apart BIG.           Composite Extension (Passive Flexor Stretch)    Sitting with elbows on table and palms together, slowly lower wrists toward table until stretch is felt. Be sure to keep palms together throughout stretch. Hold ____ seconds. Relax. Repeat ____ times. Do ____ sessions per day.  Copyright  VHI. All rights reserved.

## 2023-04-12 ENCOUNTER — Ambulatory Visit: Payer: Medicare HMO | Admitting: Occupational Therapy

## 2023-04-12 ENCOUNTER — Ambulatory Visit: Payer: Medicare HMO | Admitting: Speech Pathology

## 2023-04-12 ENCOUNTER — Encounter: Payer: Self-pay | Admitting: Speech Pathology

## 2023-04-12 DIAGNOSIS — R471 Dysarthria and anarthria: Secondary | ICD-10-CM

## 2023-04-12 DIAGNOSIS — R278 Other lack of coordination: Secondary | ICD-10-CM | POA: Diagnosis not present

## 2023-04-12 DIAGNOSIS — F801 Expressive language disorder: Secondary | ICD-10-CM

## 2023-04-12 DIAGNOSIS — I6300 Cerebral infarction due to thrombosis of unspecified precerebral artery: Secondary | ICD-10-CM

## 2023-04-12 DIAGNOSIS — M6281 Muscle weakness (generalized): Secondary | ICD-10-CM

## 2023-04-12 DIAGNOSIS — R29818 Other symptoms and signs involving the nervous system: Secondary | ICD-10-CM

## 2023-04-12 DIAGNOSIS — G20B1 Parkinson's disease with dyskinesia, without mention of fluctuations: Secondary | ICD-10-CM

## 2023-04-12 DIAGNOSIS — R29898 Other symptoms and signs involving the musculoskeletal system: Secondary | ICD-10-CM

## 2023-04-12 DIAGNOSIS — G250 Essential tremor: Secondary | ICD-10-CM

## 2023-04-12 NOTE — Therapy (Signed)
 OUTPATIENT OCCUPATIONAL THERAPY NEURO TREATMENT  Patient Name: Joshua [name] MRN: [id number] DOB:03-31-1945, 78 y.o., male Today's Date: 04/12/2023  PCP: [name], MD  REFERRING PROVIDER: Arbutus Leas Octaviano Batty, DO  END OF SESSION:  OT End of Session - 04/12/23 1012     Visit Number 7    Number of Visits 13    Date for OT Re-Evaluation 04/23/23    Authorization Type [employer]    OT Start Time 1015    OT Stop Time 1054    OT Time Calculation (min) 39 min    Activity Tolerance Patient tolerated treatment well    Behavior During Therapy [facility] for tasks assessed/performed             Past Medical History:  Diagnosis Date   Allergic rhinitis    Anxiety    from chronic pain from surgery- on Cymbalta   Arthritis    Asthma    Barrett's esophagus 03/29/2014   Carotid artery disease (HCC)    Carotid Doppler normal August, 2007   Coronary artery disease    Diverticulosis    Dyslipidemia    GERD (gastroesophageal reflux disease)    History of loop recorder    has since 04/04/15   HTN (hypertension)    takes Metoprolol for PVC control   Hx of colonic polyps    adenomatous   IFG (impaired fasting glucose)    Myocardial infarction (HCC)    mild   Neuromuscular disorder (HCC)    parkinson's tremors in hands   Palpitations    Benign PVCs   Parkinson disease (HCC)    Pneumonia    Prostate cancer (HCC)    RBBB (right bundle branch block)    rate related   Shingles    Stroke (HCC) 2017   TIA (transient ischemic attack) 02/2015   Per pt, had 2 strokes   Vertigo    Past Surgical History:  Procedure Laterality Date   BACK SURGERY  2002,2009   x 6   CHOLECYSTECTOMY  11/30/2012   with IOC   COLONOSCOPY     ELECTROPHYSIOLOGIC STUDY N/A 09/26/2015   Procedure: V Tach Ablation (PVC);  Surgeon: Will Jorja Loa, MD;  Location: [facility];  Service: Cardiovascular;  Laterality: N/A;   EP IMPLANTABLE DEVICE N/A 04/04/2015   Procedure: Loop  Recorder Insertion;  Surgeon: Will Jorja Loa, MD;  Location: [facility];  Service: Cardiovascular;  Laterality: N/A;   HERNIA REPAIR     laprascopic   KNEE SURGERY     DECEMBER 2017,LEFT KNEE SCOPED   LEFT HEART CATH AND CORONARY ANGIOGRAPHY N/A 02/05/2021   Procedure: LEFT HEART CATH AND CORONARY ANGIOGRAPHY;  Surgeon: Iran Ouch, MD;  Location: [facility];  Service: Cardiovascular;  Laterality: N/A;   NECK SURGERY  2002   POLYPECTOMY     ROTATOR CUFF REPAIR Left    TEE WITHOUT CARDIOVERSION N/A 04/04/2015   Procedure: TRANSESOPHAGEAL ECHOCARDIOGRAM (TEE);  Surgeon: Lewayne Bunting, MD;  [facility];  Service: Cardiovascular;  Laterality: N/A;   TOTAL KNEE ARTHROPLASTY Left 09/28/2022   Procedure: TOTAL KNEE ARTHROPLASTY;  Surgeon: Joen Laura, MD;  Location: [facility] OR[facility];  Service: Orthopedics;  Laterality: Left;   TOTAL KNEE ARTHROPLASTY Right 01/11/2023   Procedure: TOTAL KNEE ARTHROPLASTY;  Surgeon: Joen Laura, MD;  Location: [facility];  Service: Orthopedics;  Laterality: Right;   TRIGGER FINGER RELEASE Left 12/28/2019   Procedure: RELEASE TRIGGER FINGER/A-1 PULLEY THUMB,  MIDDLE AND RING;  Surgeon: Cindee Salt, MD;  Location: [facility];  Service: Orthopedics;  Laterality: Left;  FAB   UPPER GASTROINTESTINAL ENDOSCOPY     V Tach ablation  09/26/2015   Patient Active Problem List   Diagnosis Date Noted   CVA (cerebral vascular accident) (HCC) 01/15/2023   Primary osteoarthritis of right knee 01/11/2023   Localized osteoarthritis of right knee 01/11/2023   Primary osteoarthritis of left knee 09/28/2022   SBO (small bowel obstruction) (HCC) 05/29/2022   Chest pain 05/26/2022   Epigastric abdominal pain 05/26/2022   Coronary artery disease involving native coronary artery of native heart without angina pectoris 05/28/2021   History of myocardial infarction 02/05/2021   Parkinson's disease (HCC)    Malignant neoplasm of  prostate (HCC) 11/01/2017   Essential tremor 12/06/2015   PVC (premature ventricular contraction) 09/26/2015   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) 04/07/2015   Acute CVA (cerebrovascular accident) (HCC) 03/06/2015   Stroke (cerebrum) (HCC) 03/05/2015   Cerebral infarction (HCC) 03/05/2015   Weakness of right upper extremity    Tremor    Ejection fraction    Vertigo    Sleep apnea    Palpitations    Hyperlipidemia with target LDL less than 70    GERD (gastroesophageal reflux disease)    HTN (hypertension)    Chest pain    RBBB (right bundle branch block)    Ejection fraction    Carotid artery disease (HCC)     ONSET DATE: 03/01/2023 (Date of referral); 01/11/2023 date of stroke and RTKA  REFERRING DIAG:  G20.A1 (ICD-10-CM) - Parkinson's disease without dyskinesia or fluctuating manifestations (HCC)  I63.89 (ICD-10-CM) - Cerebral infarction due to other mechanism    THERAPY DIAG:  1. Other lack of coordination (Primary) 2. Muscle weakness (generalized) 3. Essential tremor 4. Other symptoms and signs involving the nervous system     Rationale for Evaluation and Treatment: Rehabilitation  SUBJECTIVE:   SUBJECTIVE STATEMENT:  He cannot tolerate sleeping on his right side and last night, he also had difficulty sleeping on his left side due to pain. He is to see doctor on Thursday.   Pt accompanied by: self  PERTINENT HISTORY: PMH: Rt TKA 01/11/23 with subsequent acute Lt CVA affecting Rt side, PD, Anxiety, HTN, HLD, multiple back surgeries/fusion neck and lumbar spine,  NSTEMI, 01/2020, hx of multiple syncopal episodes, CVA in 2017   04/06/2023 Impression: This is an abnormal study. 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 No electrodiagnostic evidence of a right radial mononeuropathy. No electrodiagnostic  evidence of a right cervical (C5-T1) motor radiculopathy.  PRECAUTIONS: Fall  WEIGHT BEARING RESTRICTIONS: No  PAIN:  Are you having pain? Yes: NPRS scale: up to 2-3/10 Pain location: back Pain description: aching/burning Aggravating factors: bumped his back Relieving factors: rest, stretching, heat  FALLS: Has patient fallen in last 6 months? No  LIVING ENVIRONMENT: Lives with: lives with their spouse Lives in: House/apartment - Town Home Stairs: No Has following equipment at home: Single point cane, Environmental consultant - 4 wheeled, Grab bars, and transport chair Has a lift chair and a lift bed.   PLOF: Independent; Horticulturist, commercial; driving  PATIENT GOALS: reduce affects of tremor in RUE  OBJECTIVE:   HAND DOMINANCE: Right  ADLs: Overall ADLs: mod I except buttons (unable to do buttons), difficulty with smaller zippers Eating: setup but  only 40% with Rt dominant hand w/ spills Grooming: brushing teeth using electric toothbrush using only Rt hand 40% Equipment: Shower seat with back, Grab bars, Walk in shower, Reacher, Sock aid, Long handled shoe horn, and Long handled sponge  IADLs: Shopping: able to do light shopping with shopping cart Light housekeeping: cleans bathrooms minus the tubs and uses light weight vacuum cleaners Meal Prep: Mod I Community mobility: driving Medication management: mod I Financial management: 50/50 with wife Handwriting: 25% legible  MOBILITY STATUS: Needs Assist: uses SPC and Hx of falls  ACTIVITY TOLERANCE: Activity tolerance: fair  FUNCTIONAL OUTCOME MEASURES: To be assessed during future visits  UPPER EXTREMITY ROM:     AROM Right (eval) Left (eval)  Shoulder flexion WNL WNL  Shoulder abduction WNL WNL  Elbow flexion WNL WNL  Elbow extension WNL WNL  Wrist flexion BFL WNL  Wrist extension none WNL  Wrist pronation WFL WNL  Wrist supination WFL WNL   Digit Composite Flexion WFL WNL  Digit Composite Extension WNL WNL  Digit  Opposition BFL WNL  (Blank rows = not tested)  UPPER EXTREMITY MMT:     MMT Right (eval) Left (eval)  Shoulder flexion WFL WNL  Shoulder abduction WFL WNL  Elbow flexion WFL WNL  Elbow extension WFL WNL  (Blank rows = not tested)  HAND FUNCTION: Grip strength: Right: 20 lbs; Left:73 lbs  COORDINATION: 9 Hole Peg test: Right: unable to place any pegs in 60 sec (2 drops) - significant decline; Left: 44 sec - no change  Box and Blocks:  Right 22 blocks - significant decline, Left 37 blocks - no change Tremors: Right, Left, and right worse than left  Simulated Eating: Right - 20 seconds; Left (pt using this side since stroke, especially with high viscus foods) - 21 seconds   Buttons: 85 seconds - significant decline  SENSATION: Moderate to severe paresthesias in R hand and fingers primarily but extends up to shoulder, worse since stroke  EDEMA: mild arthritic changes to R wrist  MUSCLE TONE: RUE: Rigidity; mild  COGNITION: Overall cognitive status: Within functional limits for tasks assessed  VISION: Subjective report: Just got new glasses RX; limited changes Baseline vision: Wears glasses all the time; progressive lenses Visual history: n/a  OBSERVATIONS: Bradykinesia, Pt with worse tremors on Rt side, milder on Lt side. Decreased coordination Rt side from CVA w/ increase in PD symptoms.  Pt w/ h/o 7 back surgeries and 1 neck sx   TODAY'S TREATMENT:                                                                                                                               Objective measures assessed as noted in Goals section to determine progression towards goals. Pt practiced feeding using tremor strategies. Educated on use of bowl, elevating dish, using spoon, and keeping arms closer to body with completion.  Pt practiced buttoning of shirt with cues for hand placements and  using vision for compensation due to limited RUE sensation.   Pt demonstrating ability to  don and doff jacket in 15, 21, and 15 seconds (17 seconds average) with cues to grab big behind reach to L sleeve and grab at belly button level to bring jacket over shoulders when doffing.   PATIENT EDUCATION: Education details: PWR! Hands, PWR! Sitting (modified prn), prayer stretch for Rt wrist ext Person educated: Patient and Spouse Education method: Explanation, Demonstration, Tactile cues, and Verbal cues Education comprehension: verbalized understanding, returned demonstration, verbal cues required, tactile cues required, and needs further education  HOME EXERCISE PROGRAM: 03/22/23: R Wrist ROM: Access Code: [email or web address] 03/24/23: Coordination HEP, strategies for tremors 04/08/23: PWR! Hands, PWR! Sitting (modified prn), prayer stretch for Rt wrist ext  GOALS:  SHORT TERM GOALS: Target date: 04/06/2023     Pt will be independent with PD specific HEP.  Baseline: Goal status: IN PROGRESS  2.  Pt will verbalize understanding of adapted strategies to maximize safety and I with ADLs/IADLs. Baseline:  Goal status: IN PROGRESS  3.  Pt will verbalize understanding of ways to prevent future PD related complications and PD community resources.  Baseline:  Goal status: INITIAL  4.  Pt will report use of R as dominant hand with feeding and show improvement to PPT #2 score.  Baseline: using LUE for eating and completed test in 20 seconds with R Goal status: IN PROGRESS   LONG TERM GOALS: Target date: 04/23/2023  Pt will demonstrate improved ease with fastening buttons as evidenced by decreasing 3 button/unbutton time by at least 16 seconds  Baseline: 86 seconds 04/12/2023: 66 seconds Goal status: MET  2.  Patient will complete nine-hole peg with use of R in 120 seconds or less with no more than 1 drop. Baseline: Right: unable to place any pegs in 60 sec (2 drops) Goal status: INITIAL  3.  Patient will demonstrate at least 31 lbs R grip strength as needed to open jars and other  containers. Baseline: Right: 20 lbs 04/12/2023: 33 lbs Goal status: MET  4.  Pt will be able to place at least 30 blocks using right hand with completion of Box and Blocks test. Baseline: Right 22 blocks Goal status: INITIAL  ASSESSMENT:  CLINICAL IMPRESSION: Patient demonstrates good understanding of ADL strategies with good progress towards goals. Recommend assessing remaining goals to guide remainder of POC with insight from ortho and neuro for additional adjustments as needed.   PERFORMANCE DEFICITS: in functional skills including ADLs, IADLs, coordination, strength, Fine motor control, Gross motor control, mobility, balance, and UE functional use.  IMPAIRMENTS: are limiting patient from ADLs, IADLs, and leisure.   COMORBIDITIES:  may have co-morbidities  that affects occupational performance. Patient will benefit from skilled OT to address above impairments and improve overall function.  REHAB POTENTIAL: Fair given diagnosis   PLAN:  OT FREQUENCY: 2x/week  OT DURATION: 6 weeks  PLANNED INTERVENTIONS: self care/ADL training, therapeutic exercise, therapeutic activity, neuromuscular re-education, manual therapy, passive range of motion, balance training, functional mobility training, splinting, electrical stimulation, ultrasound, paraffin, fluidotherapy, moist heat, patient/family education, cognitive remediation/compensation, visual/perceptual remediation/compensation, energy conservation, coping strategies training, DME and/or AE instructions, and Re-evaluation  RECOMMENDED OTHER SERVICES: none at this time  CONSULTED AND AGREED WITH PLAN OF CARE: Patient  PLAN FOR NEXT SESSION: any new tests? See ortho?  Assess remaining goals pt may also benefit from [facility]! Supine (w/ modifications prn for back)  Delana Meyer, OT 04/12/2023, 3:25 PM

## 2023-04-12 NOTE — Patient Instructions (Signed)
   Set up E Library - call - the number is on the back of your book  Each lesson should be completed twice

## 2023-04-12 NOTE — Therapy (Signed)
 OUTPATIENT SPEECH LANGUAGE PATHOLOGY PARKINSON'S TREATMENT   Patient Name: Joshua [name] MRN: [id number] DOB:1945/04/02, 78 y.o., male Today's Date: 04/12/2023  PCP: Cleatis Polka, MD REFERRING PROVIDER: Vladimir Faster, DO  END OF SESSION:  End of Session - 04/12/23 1104     Visit Number 3    Number of Visits 17    Date for SLP Re-Evaluation 05/24/23    SLP Start Time 1100    SLP Stop Time  1145    SLP Time Calculation (min) 45 min    Activity Tolerance Patient tolerated treatment well              Past Medical History:  Diagnosis Date   Allergic rhinitis    Anxiety    from chronic pain from surgery- on Cymbalta   Arthritis    Asthma    Barrett's esophagus 03/29/2014   Carotid artery disease (HCC)    Carotid Doppler normal August, 2007   Coronary artery disease    Diverticulosis    Dyslipidemia    GERD (gastroesophageal reflux disease)    History of loop recorder    has since 04/04/15   HTN (hypertension)    takes Metoprolol for PVC control   Hx of colonic polyps    adenomatous   IFG (impaired fasting glucose)    Myocardial infarction (HCC)    mild   Neuromuscular disorder (HCC)    parkinson's tremors in hands   Palpitations    Benign PVCs   Parkinson disease (HCC)    Pneumonia    Prostate cancer (HCC)    RBBB (right bundle branch block)    rate related   Shingles    Stroke (HCC) 2017   TIA (transient ischemic attack) 02/2015   Per pt, had 2 strokes   Vertigo    Past Surgical History:  Procedure Laterality Date   BACK SURGERY  2002,2009   x 6   CHOLECYSTECTOMY  11/30/2012   with IOC   COLONOSCOPY     ELECTROPHYSIOLOGIC STUDY N/A 09/26/2015   Procedure: V Tach Ablation (PVC);  Surgeon: Will Jorja Loa, MD;  Location: [facility];  Service: Cardiovascular;  Laterality: N/A;   EP IMPLANTABLE DEVICE N/A 04/04/2015   Procedure: Loop Recorder Insertion;  Surgeon: Will Jorja Loa, MD;  Location: [facility];   Service: Cardiovascular;  Laterality: N/A;   HERNIA REPAIR     laprascopic   KNEE SURGERY     DECEMBER 2017,LEFT KNEE SCOPED   LEFT HEART CATH AND CORONARY ANGIOGRAPHY N/A 02/05/2021   Procedure: LEFT HEART CATH AND CORONARY ANGIOGRAPHY;  Surgeon: Iran Ouch, MD;  Location: [facility];  Service: Cardiovascular;  Laterality: N/A;   NECK SURGERY  2002   POLYPECTOMY     ROTATOR CUFF REPAIR Left    TEE WITHOUT CARDIOVERSION N/A 04/04/2015   Procedure: TRANSESOPHAGEAL ECHOCARDIOGRAM (TEE);  Surgeon: Lewayne Bunting, MD;  Location: [facility];  Service: Cardiovascular;  Laterality: N/A;   TOTAL KNEE ARTHROPLASTY Left 09/28/2022   Procedure: TOTAL KNEE ARTHROPLASTY;  Surgeon: Joen Mane Consolo, MD;  Location: [facility];  Service: Orthopedics;  Laterality: Left;   TOTAL KNEE ARTHROPLASTY Right 01/11/2023   Procedure: TOTAL KNEE ARTHROPLASTY;  Surgeon: Joen Lou Irigoyen, MD;  Location: [facility];  Service: Orthopedics;  Laterality: Right;   TRIGGER FINGER RELEASE Left 12/28/2019   Procedure: RELEASE TRIGGER FINGER/A-1 PULLEY THUMB, MIDDLE AND RING;  Surgeon: Cindee Salt, MD;  Location: [facility];  Service: Orthopedics;  Laterality: Left;  FAB   UPPER GASTROINTESTINAL ENDOSCOPY     V Tach ablation  09/26/2015   Patient Active Problem List   Diagnosis Date Noted   CVA (cerebral vascular accident) (HCC) 01/15/2023   Primary osteoarthritis of right knee 01/11/2023   Localized osteoarthritis of right knee 01/11/2023   Primary osteoarthritis of left knee 09/28/2022   SBO (small bowel obstruction) (HCC) 05/29/2022   Chest pain 05/26/2022   Epigastric abdominal pain 05/26/2022   Coronary artery disease involving native coronary artery of native heart without angina pectoris 05/28/2021   History of myocardial infarction 02/05/2021   Parkinson's disease (HCC)    Malignant neoplasm of prostate (HCC) 11/01/2017   Essential tremor 12/06/2015   PVC (premature ventricular  contraction) 09/26/2015   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) 04/07/2015   Acute CVA (cerebrovascular accident) (HCC) 03/06/2015   Stroke (cerebrum) (HCC) 03/05/2015   Cerebral infarction (HCC) 03/05/2015   Weakness of right upper extremity    Tremor    Ejection fraction    Vertigo    Sleep apnea    Palpitations    Hyperlipidemia with target LDL less than 70    GERD (gastroesophageal reflux disease)    HTN (hypertension)    Chest pain    RBBB (right bundle branch block)    Ejection fraction    Carotid artery disease (HCC)     ONSET DATE: 03/01/2023 (Date of referral); 01/11/2023 date of stroke and RTKA, Dx PD 2017  REFERRING DIAG: G20.A1 (ICD-10-CM) - Parkinson's disease without dyskinesia or fluctuating manifestations (HCC) I63.89 (ICD-10-CM) - Cerebral infarction due to other mechanism  THERAPY DIAG:  Dysarthria and anarthria  Expressive language impairment  Rationale for Evaluation and Treatment: Rehabilitation  SUBJECTIVE:   SUBJECTIVE STATEMENT: "Doing well - I've done through Lesson 7" Pt accompanied by: self  PERTINENT HISTORY:   Patient went to the hospital for right knee replacement January 11, 2023.  Unfortunately, patient woke up from anesthesia with right-sided weakness.  MRI brain demonstrated acute infarct in the left precentral and middle frontal gyrus.  Patient was seen by Dr. Pearlean Brownie.  Patient was obviously off of the Plavix for the knee replacement.  His LDL was only 23.  His left ventricular ejection fraction was 50 to 55%.  CTA of the head and neck demonstrated no significant stenosis of the carotids.  There is mild to moderate left vertebral artery stenosis.  Patient subsequently went to inpatient rehab, primarily for the expressive aphasia and mild right hemiparesis. PMH + PD which was dx in 2017 - he has never received ST for PD.   PAIN:  Are you having pain? No  FALLS: Has patient fallen in last 6 months?  See PT evaluation for  details - received PT for TKR at another clinic. No PT here at this time.   LIVING ENVIRONMENT: Lives with: lives with their spouse Lives in: House/apartment  PLOF:  Level of assistance: Independent with ADLs, Independent with IADLs Employment: Retired  PATIENT GOALS: "I want to work on improving my speech and pronunciation"  OBJECTIVE:  Note: Objective measures were completed at Evaluation unless otherwise noted.  DIAGNOSTIC FINDINGS: Acute infarct of the left precentral gyrus and middle frontal gyrus with petechial hemorrhage at the infarct site. No mass effect or midline shift.  TREATMENT DATE:  04/11/33: Pt has completed Speak Out! Lessons, however he has not completed each lesson twice as prescribed. Reviewed this and provided home exercise log to help keep track of which lessons have been completed. He has completed some on line home practice sessions as well. Today targeted volume, intelligibility with Parkinson [employer] (PVP) e Library April Lesson 1 - Nadine Counts required occasional modeling and verbal cues to hit target dB's on warm ups through counting, as well as to make exercises smooth and connected. With occasional min A Bob averaged target dB on reading and conversation exercises. In conversation he required usual mod A to maintain minimum of 70dB.   04/06/23: Received personal Speak Out! Workbook yesterday. Reported completion of online practice yesterday as well. Watched PD videos with some benefit to aid understanding of program. Re-educated rationale for Speak Out! Program and principles in light of PD and recent stroke impacting volume and speech intelligibility. Pt and wife verbalized understanding. Completed Lesson #2 today. ST leads pt through exercises providing usual model prior to pt execution. occasional min-A required to achieve target dB this date.  Averages this date:  Sustained "ah" 83 dB Reading 76 dB Conversation speech task 73 dB.  Conversational sample of approx 5 minutes, pt averages 71 dB with occasional min-A to maintain targeted intensity. Provided PD wallet card as pt still occasionally driving. Briefly reviewed anomia strategies, with pt reporting use of extra time and circumlocution when word finding episodes occur. Reportedly has instructed family members to allow him extra time and not provide targeted word.   03/29/23 (eval completed): Initiated training in compensations for dysarthria by speaking with intent - he required frequent modeling and verbal cues to read conversational sentences with intent to improve voice quality and articulation. Introduced [employer] and demonstrated where to find the required [employer]. Ordered Speak Out! Work book. Initiated training in compensations for slow processing to improve participation in conversations with family members. See Patient Instructions. Introduced Careers adviser for word finding by modeling  3 examples.  PATIENT EDUCATION: Education details: See Treatment note, See Patient Instructions, HEP for dysarthria, compensations for slow processing, compensations for dysarthria Person educated: Patient Education method: Explanation, Demonstration, Verbal cues, and Handouts Education comprehension: verbalized understanding, returned demonstration, verbal cues required, and needs further education  HOME EXERCISE PROGRAM: Speak Out!    GOALS: Goals reviewed with patient? Yes  SHORT TERM GOALS: Target date: 04/26/23  Pt will complete HEP for dysarthria with occasional min A Baseline: Goal status: IN PROGRESS  2.  Pt will complete at least 1 on line home practice session on the Parkinson [employer] website Baseline:  Goal status: MET  3.  Pt will complete moderately complex naming task with 90% accuracy and occasional min A Baseline:  Goal status:  IN PROGRESS  4.  Pt will average 72dB 18/20 sentences with rare min A Baseline:  Goal status: MET  5.  Pt will employ verbal compensations for word finding in structured tasks 3/5 opportunities with occasional min A Baseline:  Goal status: IN PROGRESS   LONG TERM GOALS: Target date: 05/24/23  Pt will complete HEP for dysarthria with mod I Baseline:  Goal status: IN PROGRESS  2.  Pt will complete HEP for dysarthria 2x a day 5/7 days over 1 week Baseline:  Goal status: IN PROGRESS  3.  Pt will access on line eLibrary for home practice 2x with rare min A Baseline:  Goal status: IN PROGRESS  4.  Pt &  family will carryover 3 compensatory strategies to support auditory comprehension/slow processing in group and phone conversations Baseline:  Goal status: IN PROGRESS  5.  Pt will average 70dB over 15 minute conversation with rare min A Baseline:  Goal status: IN PROGRESS  6.  Pt will improve score on Communication Participation Item Bank by 2 points Baseline: 14 Goal status: IN PROGRESS  ASSESSMENT:  CLINICAL IMPRESSION: Patient is a 78 y.o. male who was seen today for mild expressive language impairment and mild hypokinetic dysarthria. Dysarthria exacerbate by recent CVA and Nadine Counts feels his speech is more "jumpy" and "slurred" since the CVA. He endorses that hoarseness is due to PD. He endorses difficulty speaking with his daughters who "try to fill in what he is saying" when he pauses for processing and word finding - he finds this frustrating. He endorses communication breakdowns with spouse, especially at a distance or when completing a task. Initiated education and instruction of Speak Out! Program and principle of intent to maximize volume and speech clarity. I recommend skilled ST to maximize communication for safety, independence and QOL.    OBJECTIVE IMPAIRMENTS: Objective impairments include expressive language and dysarthria. These impairments are limiting patient from  effectively communicating at home and in community.Factors affecting potential to achieve goals and functional outcome are co-morbidities.. Patient will benefit from skilled SLP services to address above impairments and improve overall function.  REHAB POTENTIAL: Good  PLAN:  SLP FREQUENCY: 2x/week  SLP DURATION: 8 weeks  PLANNED INTERVENTIONS: Aspiration precaution training, Diet toleration management , Language facilitation, Environmental controls, Cueing hierachy, Cognitive reorganization, Internal/external aids, Functional tasks, Multimodal communication approach, SLP instruction and feedback, Compensatory strategies, and Patient/family education, MBSS if indicated    Marveline Profeta, Radene Journey, CCC-SLP 04/12/2023, 11:54 AM

## 2023-04-15 ENCOUNTER — Ambulatory Visit: Payer: Medicare HMO | Attending: Neurology | Admitting: Speech Pathology

## 2023-04-15 ENCOUNTER — Ambulatory Visit: Payer: Medicare HMO | Admitting: Occupational Therapy

## 2023-04-15 DIAGNOSIS — R278 Other lack of coordination: Secondary | ICD-10-CM | POA: Insufficient documentation

## 2023-04-15 DIAGNOSIS — G250 Essential tremor: Secondary | ICD-10-CM | POA: Insufficient documentation

## 2023-04-15 DIAGNOSIS — R2681 Unsteadiness on feet: Secondary | ICD-10-CM | POA: Diagnosis present

## 2023-04-15 DIAGNOSIS — M6281 Muscle weakness (generalized): Secondary | ICD-10-CM | POA: Diagnosis present

## 2023-04-15 DIAGNOSIS — R293 Abnormal posture: Secondary | ICD-10-CM | POA: Diagnosis present

## 2023-04-15 DIAGNOSIS — R29898 Other symptoms and signs involving the musculoskeletal system: Secondary | ICD-10-CM | POA: Diagnosis present

## 2023-04-15 DIAGNOSIS — R471 Dysarthria and anarthria: Secondary | ICD-10-CM | POA: Diagnosis present

## 2023-04-15 DIAGNOSIS — R29818 Other symptoms and signs involving the nervous system: Secondary | ICD-10-CM | POA: Insufficient documentation

## 2023-04-15 NOTE — Therapy (Signed)
 OUTPATIENT SPEECH LANGUAGE PATHOLOGY PARKINSON'S TREATMENT   Patient Name: Joshua [name] MRN: [id number] DOB:09/14/1945, 78 y.o., male Today's Date: 04/15/2023  PCP: Cleatis Polka, MD REFERRING PROVIDER: Vladimir Faster, DO  END OF SESSION:  End of Session - 04/15/23 0929     Visit Number 4    Number of Visits 17    Date for SLP Re-Evaluation 05/24/23    SLP Start Time 0930    SLP Stop Time  1015    SLP Time Calculation (min) 45 min    Activity Tolerance Patient tolerated treatment well               Past Medical History:  Diagnosis Date   Allergic rhinitis    Anxiety    from chronic pain from surgery- on Cymbalta   Arthritis    Asthma    Barrett's esophagus 03/29/2014   Carotid artery disease (HCC)    Carotid Doppler normal August, 2007   Coronary artery disease    Diverticulosis    Dyslipidemia    GERD (gastroesophageal reflux disease)    History of loop recorder    has since 04/04/15   HTN (hypertension)    takes Metoprolol for PVC control   Hx of colonic polyps    adenomatous   IFG (impaired fasting glucose)    Myocardial infarction (HCC)    mild   Neuromuscular disorder (HCC)    parkinson's tremors in hands   Palpitations    Benign PVCs   Parkinson disease (HCC)    Pneumonia    Prostate cancer (HCC)    RBBB (right bundle branch block)    rate related   Shingles    Stroke (HCC) 2017   TIA (transient ischemic attack) 02/2015   Per pt, had 2 strokes   Vertigo    Past Surgical History:  Procedure Laterality Date   BACK SURGERY  2002,2009   x 6   CHOLECYSTECTOMY  11/30/2012   with IOC   COLONOSCOPY     ELECTROPHYSIOLOGIC STUDY N/A 09/26/2015   Procedure: V Tach Ablation (PVC);  Surgeon: Will Jorja Loa, MD;  Location: [facility];  Service: Cardiovascular;  Laterality: N/A;   EP IMPLANTABLE DEVICE N/A 04/04/2015   Procedure: Loop Recorder Insertion;  Surgeon: Will Jorja Loa, MD;  Location: [facility];   Service: Cardiovascular;  Laterality: N/A;   HERNIA REPAIR     laprascopic   KNEE SURGERY     DECEMBER 2017,LEFT KNEE SCOPED   LEFT HEART CATH AND CORONARY ANGIOGRAPHY N/A 02/05/2021   Procedure: LEFT HEART CATH AND CORONARY ANGIOGRAPHY;  Surgeon: Iran Ouch, MD;  Location: [facility];  Service: Cardiovascular;  Laterality: N/A;   NECK SURGERY  2002   POLYPECTOMY     ROTATOR CUFF REPAIR Left    TEE WITHOUT CARDIOVERSION N/A 04/04/2015   Procedure: TRANSESOPHAGEAL ECHOCARDIOGRAM (TEE);  Surgeon: Lewayne Bunting, MD;  Location: [facility];  Service: Cardiovascular;  Laterality: N/A;   TOTAL KNEE ARTHROPLASTY Left 09/28/2022   Procedure: TOTAL KNEE ARTHROPLASTY;  Surgeon: Joen Laura, MD;  Location: [facility];  Service: Orthopedics;  Laterality: Left;   TOTAL KNEE ARTHROPLASTY Right 01/11/2023   Procedure: TOTAL KNEE ARTHROPLASTY;  Surgeon: Joen Laura, MD;  Location: [facility];  Service: Orthopedics;  Laterality: Right;   TRIGGER FINGER RELEASE Left 12/28/2019   Procedure: RELEASE TRIGGER FINGER/A-1 PULLEY THUMB, MIDDLE AND RING;  Surgeon: Cindee Salt, MD;  Location: [facility];  Service: Orthopedics;  Laterality: Left;  FAB   UPPER GASTROINTESTINAL ENDOSCOPY     V Tach ablation  09/26/2015   Patient Active Problem List   Diagnosis Date Noted   CVA (cerebral vascular accident) (HCC) 01/15/2023   Primary osteoarthritis of right knee 01/11/2023   Localized osteoarthritis of right knee 01/11/2023   Primary osteoarthritis of left knee 09/28/2022   SBO (small bowel obstruction) (HCC) 05/29/2022   Chest pain 05/26/2022   Epigastric abdominal pain 05/26/2022   Coronary artery disease involving native coronary artery of native heart without angina pectoris 05/28/2021   History of myocardial infarction 02/05/2021   Parkinson's disease (HCC)    Malignant neoplasm of prostate (HCC) 11/01/2017   Essential tremor 12/06/2015   PVC (premature ventricular  contraction) 09/26/2015   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) 04/07/2015   Acute CVA (cerebrovascular accident) (HCC) 03/06/2015   Stroke (cerebrum) (HCC) 03/05/2015   Cerebral infarction (HCC) 03/05/2015   Weakness of right upper extremity    Tremor    Ejection fraction    Vertigo    Sleep apnea    Palpitations    Hyperlipidemia with target LDL less than 70    GERD (gastroesophageal reflux disease)    HTN (hypertension)    Chest pain    RBBB (right bundle branch block)    Ejection fraction    Carotid artery disease (HCC)     ONSET DATE: 03/01/2023 (Date of referral); 01/11/2023 date of stroke and RTKA, Dx PD 2017  REFERRING DIAG: G20.A1 (ICD-10-CM) - Parkinson's disease without dyskinesia or fluctuating manifestations (HCC) I63.89 (ICD-10-CM) - Cerebral infarction due to other mechanism  THERAPY DIAG:  Dysarthria and anarthria  Rationale for Evaluation and Treatment: Rehabilitation  SUBJECTIVE:   SUBJECTIVE STATEMENT: "I've been practicing twice a day" Pt accompanied by: self  PERTINENT HISTORY:   Patient went to the hospital for right knee replacement January 11, 2023.  Unfortunately, patient woke up from anesthesia with right-sided weakness.  MRI brain demonstrated acute infarct in the left precentral and middle frontal gyrus.  Patient was seen by Dr. Pearlean Brownie.  Patient was obviously off of the Plavix for the knee replacement.  His LDL was only 23.  His left ventricular ejection fraction was 50 to 55%.  CTA of the head and neck demonstrated no significant stenosis of the carotids.  There is mild to moderate left vertebral artery stenosis.  Patient subsequently went to inpatient rehab, primarily for the expressive aphasia and mild right hemiparesis. PMH + PD which was dx in 2017 - he has never received ST for PD.   PAIN:  Are you having pain? No  FALLS: Has patient fallen in last 6 months?  See PT evaluation for details - received PT for TKR at another  clinic. No PT here at this time.   LIVING ENVIRONMENT: Lives with: lives with their spouse Lives in: House/apartment  PLOF:  Level of assistance: Independent with ADLs, Independent with IADLs Employment: Retired  PATIENT GOALS: "I want to work on improving my speech and pronunciation"  OBJECTIVE:  Note: Objective measures were completed at Evaluation unless otherwise noted.  DIAGNOSTIC FINDINGS: Acute infarct of the left precentral gyrus and middle frontal gyrus with petechial hemorrhage at the infarct site. No mass effect or midline shift.  TREATMENT DATE: 04/15/23: Pt shared daily completion of Speak Out! and endorses he is motivated to continue consistent practice.  SLP targeted improving vocal quality and increasing intensity using Speak Out! program, lesson 4. SLP lead pt through exercises periodically providing model prior to pt execution. occasional min-A required to achieve target dB this date. Averages this date:  sustained "ah" 80 dB reading 78 dB cognitive speech task 72 dB.  conversational activity: 71 dB with occasional cues to use an intentional voice and maintain volume. SLP initiated unstructured conversation with pt regarding prior work experience with pt volume averaging at 68 dB. Pt benefiting from periodic min-A to increase volume and use clear speech. Intermittent hoarse vocal quality evidenced throughout today's session, with improvement noted with cued intentional speech.   04/11/33: Pt has completed Speak Out! Lessons, however he has not completed each lesson twice as prescribed. Reviewed this and provided home exercise log to help keep track of which lessons have been completed. He has completed some on line home practice sessions as well. Today targeted volume, intelligibility with Parkinson Voice Project (PVP) e Library April Lesson 1 - Nadine Counts  required occasional modeling and verbal cues to hit target dB's on warm ups through counting, as well as to make exercises smooth and connected. With occasional min A Bob averaged target dB on reading and conversation exercises. In conversation he required usual mod A to maintain minimum of 70dB.   04/06/23: Received personal Speak Out! Workbook yesterday. Reported completion of online practice yesterday as well. Watched PD videos with some benefit to aid understanding of program. Re-educated rationale for Speak Out! Program and principles in light of PD and recent stroke impacting volume and speech intelligibility. Pt and wife verbalized understanding. Completed Lesson #2 today. ST leads pt through exercises providing usual model prior to pt execution. occasional min-A required to achieve target dB this date. Averages this date:  Sustained "ah" 83 dB Reading 76 dB Conversation speech task 73 dB.  Conversational sample of approx 5 minutes, pt averages 71 dB with occasional min-A to maintain targeted intensity. Provided PD wallet card as pt still occasionally driving. Briefly reviewed anomia strategies, with pt reporting use of extra time and circumlocution when word finding episodes occur. Reportedly has instructed family members to allow him extra time and not provide targeted word.   03/29/23 (eval completed): Initiated training in compensations for dysarthria by speaking with intent - he required frequent modeling and verbal cues to read conversational sentences with intent to improve voice quality and articulation. Introduced [employer] and demonstrated where to find the required [employer]. Ordered Speak Out! Work book. Initiated training in compensations for slow processing to improve participation in conversations with family members. See Patient Instructions. Introduced Careers adviser for word finding by modeling  3 examples.  PATIENT EDUCATION: Education details: See  Treatment note, See Patient Instructions, HEP for dysarthria, compensations for slow processing, compensations for dysarthria Person educated: Patient Education method: Explanation, Demonstration, Verbal cues, and Handouts Education comprehension: verbalized understanding, returned demonstration, verbal cues required, and needs further education  HOME EXERCISE PROGRAM: Speak Out!    GOALS: Goals reviewed with patient? Yes  SHORT TERM GOALS: Target date: 04/26/23  Pt will complete HEP for dysarthria with occasional min A Baseline: Goal status: IN PROGRESS  2.  Pt will complete at least 1 on line home practice session on the Parkinson [employer] website Baseline:  Goal status: MET  3.  Pt will complete moderately complex naming task with  90% accuracy and occasional min A Baseline:  Goal status: IN PROGRESS  4.  Pt will average 72dB 18/20 sentences with rare min A Baseline:  Goal status: MET  5.  Pt will employ verbal compensations for word finding in structured tasks 3/5 opportunities with occasional min A Baseline:  Goal status: IN PROGRESS   LONG TERM GOALS: Target date: 05/24/23  Pt will complete HEP for dysarthria with mod I Baseline:  Goal status: IN PROGRESS  2.  Pt will complete HEP for dysarthria 2x a day 5/7 days over 1 week Baseline:  Goal status: IN PROGRESS  3.  Pt will access on line eLibrary for home practice 2x with rare min A Baseline:  Goal status: IN PROGRESS  4.  Pt & family will carryover 3 compensatory strategies to support auditory comprehension/slow processing in group and phone conversations Baseline:  Goal status: IN PROGRESS  5.  Pt will average 70dB over 15 minute conversation with rare min A Baseline:  Goal status: IN PROGRESS  6.  Pt will improve score on Communication Participation Item Bank by 2 points Baseline: 14 Goal status: IN PROGRESS  ASSESSMENT:  CLINICAL IMPRESSION: Patient is a 78 y.o. male who was seen today for  mild expressive language impairment and mild hypokinetic dysarthria. Dysarthria exacerbate by recent CVA and Nadine Counts feels his speech is more "jumpy" and "slurred" since the CVA. He endorses that hoarseness is due to PD. He endorses difficulty speaking with his daughters who "try to fill in what he is saying" when he pauses for processing and word finding - he finds this frustrating. He endorses communication breakdowns with spouse, especially at a distance or when completing a task. Continued instruction of Speak Out! Program and principle of intent to maximize volume and speech clarity. I recommend skilled ST to maximize communication for safety, independence and QOL.    OBJECTIVE IMPAIRMENTS: Objective impairments include expressive language and dysarthria. These impairments are limiting patient from effectively communicating at home and in community.Factors affecting potential to achieve goals and functional outcome are co-morbidities.. Patient will benefit from skilled SLP services to address above impairments and improve overall function.  REHAB POTENTIAL: Good  PLAN:  SLP FREQUENCY: 2x/week  SLP DURATION: 8 weeks  PLANNED INTERVENTIONS: Aspiration precaution training, Diet toleration management , Language facilitation, Environmental controls, Cueing hierachy, Cognitive reorganization, Internal/external aids, Functional tasks, Multimodal communication approach, SLP instruction and feedback, Compensatory strategies, and Patient/family education, MBSS if indicated    [employer], Student-SLP 04/15/2023, 9:30 AM

## 2023-04-19 ENCOUNTER — Ambulatory Visit: Payer: Medicare HMO

## 2023-04-19 ENCOUNTER — Encounter: Payer: Self-pay | Admitting: Occupational Therapy

## 2023-04-19 ENCOUNTER — Telehealth: Payer: Self-pay

## 2023-04-19 ENCOUNTER — Ambulatory Visit: Payer: Medicare HMO | Admitting: Occupational Therapy

## 2023-04-19 DIAGNOSIS — R2681 Unsteadiness on feet: Secondary | ICD-10-CM

## 2023-04-19 DIAGNOSIS — G250 Essential tremor: Secondary | ICD-10-CM

## 2023-04-19 DIAGNOSIS — R471 Dysarthria and anarthria: Secondary | ICD-10-CM

## 2023-04-19 DIAGNOSIS — M6281 Muscle weakness (generalized): Secondary | ICD-10-CM

## 2023-04-19 DIAGNOSIS — R293 Abnormal posture: Secondary | ICD-10-CM

## 2023-04-19 DIAGNOSIS — R29818 Other symptoms and signs involving the nervous system: Secondary | ICD-10-CM

## 2023-04-19 DIAGNOSIS — R29898 Other symptoms and signs involving the musculoskeletal system: Secondary | ICD-10-CM

## 2023-04-19 DIAGNOSIS — R278 Other lack of coordination: Secondary | ICD-10-CM

## 2023-04-19 NOTE — Therapy (Signed)
 OUTPATIENT OCCUPATIONAL THERAPY NEURO TREATMENT  Patient Name: Joshua [name] MRN: [id number] DOB:1945/08/25, 78 y.o., male Today's Date: 04/19/2023  PCP: [name], MD  REFERRING PROVIDER: Arbutus Leas Octaviano Batty, DO  END OF SESSION:  OT End of Session - 04/19/23 1107     Visit Number 8    Number of Visits 13    Date for OT Re-Evaluation 04/23/23    Authorization Type [employer]    OT Start Time 1104    OT Stop Time 1145    OT Time Calculation (min) 41 min    Activity Tolerance Patient tolerated treatment well    Behavior During Therapy [facility] for tasks assessed/performed             Past Medical History:  Diagnosis Date   Allergic rhinitis    Anxiety    from chronic pain from surgery- on Cymbalta   Arthritis    Asthma    Barrett's esophagus 03/29/2014   Carotid artery disease (HCC)    Carotid Doppler normal August, 2007   Coronary artery disease    Diverticulosis    Dyslipidemia    GERD (gastroesophageal reflux disease)    History of loop recorder    has since 04/04/15   HTN (hypertension)    takes Metoprolol for PVC control   Hx of colonic polyps    adenomatous   IFG (impaired fasting glucose)    Myocardial infarction (HCC)    mild   Neuromuscular disorder (HCC)    parkinson's tremors in hands   Palpitations    Benign PVCs   Parkinson disease (HCC)    Pneumonia    Prostate cancer (HCC)    RBBB (right bundle branch block)    rate related   Shingles    Stroke (HCC) 2017   TIA (transient ischemic attack) 02/2015   Per pt, had 2 strokes   Vertigo    Past Surgical History:  Procedure Laterality Date   BACK SURGERY  2002,2009   x 6   CHOLECYSTECTOMY  11/30/2012   with IOC   COLONOSCOPY     ELECTROPHYSIOLOGIC STUDY N/A 09/26/2015   Procedure: V Tach Ablation (PVC);  Surgeon: Will Jorja Loa, MD;  Location: [facility];  Service: Cardiovascular;  Laterality: N/A;   EP IMPLANTABLE DEVICE N/A 04/04/2015   Procedure: Loop  Recorder Insertion;  Surgeon: Will Jorja Loa, MD;  Location: [facility];  Service: Cardiovascular;  Laterality: N/A;   HERNIA REPAIR     laprascopic   KNEE SURGERY     DECEMBER 2017,LEFT KNEE SCOPED   LEFT HEART CATH AND CORONARY ANGIOGRAPHY N/A 02/05/2021   Procedure: LEFT HEART CATH AND CORONARY ANGIOGRAPHY;  Surgeon: Iran Ouch, MD;  Location: [facility];  Service: Cardiovascular;  Laterality: N/A;   NECK SURGERY  2002   POLYPECTOMY     ROTATOR CUFF REPAIR Left    TEE WITHOUT CARDIOVERSION N/A 04/04/2015   Procedure: TRANSESOPHAGEAL ECHOCARDIOGRAM (TEE);  Surgeon: Lewayne Bunting, MD;  Location: [facility] ENDOSCOPY;  Service: Cardiovascular;  Laterality: N/A;   TOTAL KNEE ARTHROPLASTY Left 09/28/2022   Procedure: TOTAL KNEE ARTHROPLASTY;  Surgeon: Joen Laura, MD;  Location: [facility] ORS;  Service: Orthopedics;  Laterality: Left;   TOTAL KNEE ARTHROPLASTY Right 01/11/2023   Procedure: TOTAL KNEE ARTHROPLASTY;  Surgeon: Joen Laura, MD;  Location: [facility];  Service: Orthopedics;  Laterality: Right;   TRIGGER FINGER RELEASE Left 12/28/2019   Procedure: RELEASE TRIGGER FINGER/A-1 PULLEY THUMB,  MIDDLE AND RING;  Surgeon: Cindee Salt, MD;  Location: [facility];  Service: Orthopedics;  Laterality: Left;  FAB   UPPER GASTROINTESTINAL ENDOSCOPY     V Tach ablation  09/26/2015   Patient Active Problem List   Diagnosis Date Noted   CVA (cerebral vascular accident) (HCC) 01/15/2023   Primary osteoarthritis of right knee 01/11/2023   Localized osteoarthritis of right knee 01/11/2023   Primary osteoarthritis of left knee 09/28/2022   SBO (small bowel obstruction) (HCC) 05/29/2022   Chest pain 05/26/2022   Epigastric abdominal pain 05/26/2022   Coronary artery disease involving native coronary artery of native heart without angina pectoris 05/28/2021   History of myocardial infarction 02/05/2021   Parkinson's disease (HCC)    Malignant neoplasm of  prostate (HCC) 11/01/2017   Essential tremor 12/06/2015   PVC (premature ventricular contraction) 09/26/2015   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) 04/07/2015   Acute CVA (cerebrovascular accident) (HCC) 03/06/2015   Stroke (cerebrum) (HCC) 03/05/2015   Cerebral infarction (HCC) 03/05/2015   Weakness of right upper extremity    Tremor    Ejection fraction    Vertigo    Sleep apnea    Palpitations    Hyperlipidemia with target LDL less than 70    GERD (gastroesophageal reflux disease)    HTN (hypertension)    Chest pain    RBBB (right bundle branch block)    Ejection fraction    Carotid artery disease (HCC)     ONSET DATE: 03/01/2023 (Date of referral); 01/11/2023 date of stroke and RTKA  REFERRING DIAG:  G20.A1 (ICD-10-CM) - Parkinson's disease without dyskinesia or fluctuating manifestations (HCC)  I63.89 (ICD-10-CM) - Cerebral infarction due to other mechanism    THERAPY DIAG:  1. Other lack of coordination (Primary) 2. Muscle weakness (generalized) 3. Essential tremor 4. Other symptoms and signs involving the nervous system     Rationale for Evaluation and Treatment: Rehabilitation  SUBJECTIVE:   SUBJECTIVE STATEMENT:  I will be getting CTR for Rt hand probably in May.  No falls. Overall back pain is better, but still hurts in certain positions.  I'm eating more with Rt hand now  Pt accompanied by: self  PERTINENT HISTORY: PMH: Rt TKA 01/11/23 with subsequent acute Lt CVA affecting Rt side, PD, Anxiety, HTN, HLD, multiple back surgeries/fusion neck and lumbar spine,  NSTEMI, 01/2020, hx of multiple syncopal episodes, CVA in 2017   04/06/2023 Impression: This is an abnormal study. 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 No electrodiagnostic evidence of a right radial mononeuropathy. No  electrodiagnostic evidence of a right cervical (C5-T1) motor radiculopathy.  PRECAUTIONS: Fall  WEIGHT BEARING RESTRICTIONS: No  PAIN:  Are you having pain? Yes: NPRS scale: up to 2-3/10 Pain location: back Pain description: aching/burning Aggravating factors: bumped his back Relieving factors: rest, stretching, heat  FALLS: Has patient fallen in last 6 months? No  LIVING ENVIRONMENT: Lives with: lives with their spouse Lives in: House/apartment - Town Home Stairs: No Has following equipment at home: Single point cane, Environmental consultant - 4 wheeled, Grab bars, and transport chair Has a lift chair and a lift bed.   PLOF: Independent; Horticulturist, commercial; driving  PATIENT GOALS: reduce affects of tremor in RUE  OBJECTIVE:   HAND DOMINANCE: Right  ADLs: Overall ADLs: mod I except buttons (unable to do buttons), difficulty with smaller zippers Eating:  setup but only 40% with Rt dominant hand w/ spills Grooming: brushing teeth using electric toothbrush using only Rt hand 40% Equipment: Shower seat with back, Grab bars, Walk in shower, Reacher, Sock aid, Long handled shoe horn, and Long handled sponge  IADLs: Shopping: able to do light shopping with shopping cart Light housekeeping: cleans bathrooms minus the tubs and uses light weight vacuum cleaners Meal Prep: Mod I Community mobility: driving Medication management: mod I Financial management: 50/50 with wife Handwriting: 25% legible  MOBILITY STATUS: Needs Assist: uses SPC and Hx of falls  ACTIVITY TOLERANCE: Activity tolerance: fair  FUNCTIONAL OUTCOME MEASURES: To be assessed during future visits  UPPER EXTREMITY ROM:     AROM Right (eval) Left (eval)  Shoulder flexion WNL WNL  Shoulder abduction WNL WNL  Elbow flexion WNL WNL  Elbow extension WNL WNL  Wrist flexion BFL WNL  Wrist extension none WNL  Wrist pronation WFL WNL  Wrist supination WFL WNL   Digit Composite Flexion WFL WNL  Digit Composite  Extension WNL WNL  Digit Opposition BFL WNL  (Blank rows = not tested)  UPPER EXTREMITY MMT:     MMT Right (eval) Left (eval)  Shoulder flexion WFL WNL  Shoulder abduction WFL WNL  Elbow flexion WFL WNL  Elbow extension WFL WNL  (Blank rows = not tested)  HAND FUNCTION: Grip strength: Right: 20 lbs; Left:73 lbs  COORDINATION: 9 Hole Peg test: Right: unable to place any pegs in 60 sec (2 drops) - significant decline; Left: 44 sec - no change  Box and Blocks:  Right 22 blocks - significant decline, Left 37 blocks - no change Tremors: Right, Left, and right worse than left  Simulated Eating: Right - 20 seconds; Left (pt using this side since stroke, especially with high viscus foods) - 21 seconds   Buttons: 85 seconds - significant decline  SENSATION: Moderate to severe paresthesias in R hand and fingers primarily but extends up to shoulder, worse since stroke  EDEMA: mild arthritic changes to R wrist  MUSCLE TONE: RUE: Rigidity; mild  COGNITION: Overall cognitive status: Within functional limits for tasks assessed  VISION: Subjective report: Just got new glasses RX; limited changes Baseline vision: Wears glasses all the time; progressive lenses Visual history: n/a  OBSERVATIONS: Bradykinesia, Pt with worse tremors on Rt side, milder on Lt side. Decreased coordination Rt side from CVA w/ increase in PD symptoms.  Pt w/ h/o 7 back surgeries and 1 neck sx   TODAY'S TREATMENT:                                                                                                                               Pt issued PWR! Supine (basic 4) - given modifications for back pain in PWR! Step, and in PWR! Twist prn - instructed to stop if back still painful even w/ modifications Pt issued ADL strategies and briefly reviewed - pt practiced these last session Pt issued  ways to prevent future related PD complications and reviewed Further assessed progress towards goals - see goal  section  PATIENT EDUCATION: Education details: see above Person educated: Patient and Spouse Education method: Explanation, Demonstration, Tactile cues, Verbal cues, and Handouts Education comprehension: verbalized understanding, returned demonstration, and verbal cues required  HOME EXERCISE PROGRAM: 03/22/23: R Wrist ROM: Access Code: [email or web address] 03/24/23: Coordination HEP, strategies for tremors 04/08/23: PWR! Hands, PWR! Sitting (modified prn), prayer stretch for Rt wrist ext 04/19/23: PWR! Supine, ADL strategies, ways to prevent future PD related complications  GOALS:  SHORT TERM GOALS: Target date: 04/06/2023     Pt will be independent with PD specific HEP.  Baseline: Goal status: MET  2.  Pt will verbalize understanding of adapted strategies to maximize safety and I with ADLs/IADLs. Baseline:  Goal status: MET  3.  Pt will verbalize understanding of ways to prevent future PD related complications and PD community resources.  Baseline:  Goal status: IN PROGRESS (issued first one on 04/19/23)  4.  Pt will report use of R as dominant hand with feeding and show improvement to PPT #2 score.  Baseline: using LUE for eating and completed test in 20 seconds with R Goal status: IN PROGRESS (04/19/23: 42 sec but without assist from other hand)   LONG TERM GOALS: Target date: 04/23/2023  Pt will demonstrate improved ease with fastening buttons as evidenced by decreasing 3 button/unbutton time by at least 16 seconds  Baseline: 86 seconds 04/12/2023: 66 seconds Goal status: MET  2.  Patient will complete nine-hole peg with use of R in 120 seconds or less with no more than 1 drop. Baseline: Right: unable to place any pegs in 60 sec (2 drops) Goal status: NOT MET (04/19/23: able to place 2 pegs but multiple drops)  3.  Patient will demonstrate at least 31 lbs R grip strength as needed to open jars and other containers. Baseline: Right: 20 lbs 04/12/2023: 33 lbs Goal status: MET  4.  Pt  will be able to place at least 30 blocks using right hand with completion of Box and Blocks test. Baseline: Right 22 blocks Goal status: INITIAL  ASSESSMENT:  CLINICAL IMPRESSION: Patient demonstrates good understanding of ADL strategies with good progress towards goals and using Rt hand more for eating. Recommend assessing remaining goals to guide remainder of POC with insight from ortho and neuro for additional adjustments as needed.   PERFORMANCE DEFICITS: in functional skills including ADLs, IADLs, coordination, strength, Fine motor control, Gross motor control, mobility, balance, and UE functional use.  IMPAIRMENTS: are limiting patient from ADLs, IADLs, and leisure.   COMORBIDITIES:  may have co-morbidities  that affects occupational performance. Patient will benefit from skilled OT to address above impairments and improve overall function.  REHAB POTENTIAL: Fair given diagnosis   PLAN:  OT FREQUENCY: 2x/week  OT DURATION: 6 weeks  PLANNED INTERVENTIONS: self care/ADL training, therapeutic exercise, therapeutic activity, neuromuscular re-education, manual therapy, passive range of motion, balance training, functional mobility training, splinting, electrical stimulation, ultrasound, paraffin, fluidotherapy, moist heat, patient/family education, cognitive remediation/compensation, visual/perceptual remediation/compensation, energy conservation, coping strategies training, DME and/or AE instructions, and Re-evaluation  RECOMMENDED OTHER SERVICES: none at this time  CONSULTED AND AGREED WITH PLAN OF CARE: Patient  PLAN FOR NEXT SESSION: check remaining goals and d/c next session. Schedule screens in 6 months. (Pt may need therapy for CTR anticipated to happen in May)    Sheran Lawless, OT 04/19/2023, 11:08 AM

## 2023-04-19 NOTE — Therapy (Signed)
 OUTPATIENT SPEECH LANGUAGE PATHOLOGY PARKINSON'S TREATMENT   Patient Name: Joshua [name] MRN: [id number] DOB:04-16-1945, 78 y.o., male Today's Date: 04/19/2023  PCP: Cleatis Polka, MD REFERRING PROVIDER: Vladimir Faster, DO  END OF SESSION:  End of Session - 04/19/23 1145     Visit Number 5    Number of Visits 17    Date for SLP Re-Evaluation 05/24/23    SLP Start Time 1147    SLP Stop Time  1230    SLP Time Calculation (min) 43 min    Activity Tolerance Patient tolerated treatment well                Past Medical History:  Diagnosis Date   Allergic rhinitis    Anxiety    from chronic pain from surgery- on Cymbalta   Arthritis    Asthma    Barrett's esophagus 03/29/2014   Carotid artery disease (HCC)    Carotid Doppler normal August, 2007   Coronary artery disease    Diverticulosis    Dyslipidemia    GERD (gastroesophageal reflux disease)    History of loop recorder    has since 04/04/15   HTN (hypertension)    takes Metoprolol for PVC control   Hx of colonic polyps    adenomatous   IFG (impaired fasting glucose)    Myocardial infarction (HCC)    mild   Neuromuscular disorder (HCC)    parkinson's tremors in hands   Palpitations    Benign PVCs   Parkinson disease (HCC)    Pneumonia    Prostate cancer (HCC)    RBBB (right bundle branch block)    rate related   Shingles    Stroke (HCC) 2017   TIA (transient ischemic attack) 02/2015   Per pt, had 2 strokes   Vertigo    Past Surgical History:  Procedure Laterality Date   BACK SURGERY  2002,2009   x 6   CHOLECYSTECTOMY  11/30/2012   with IOC   COLONOSCOPY     ELECTROPHYSIOLOGIC STUDY N/A 09/26/2015   Procedure: V Tach Ablation (PVC);  Surgeon: Will Jorja Loa, MD;  Location: [facility];  Service: Cardiovascular;  Laterality: N/A;   EP IMPLANTABLE DEVICE N/A 04/04/2015   Procedure: Loop Recorder Insertion;  Surgeon: Will Jorja Loa, MD;  Location: [facility];   Service: Cardiovascular;  Laterality: N/A;   HERNIA REPAIR     laprascopic   KNEE SURGERY     DECEMBER 2017,LEFT KNEE SCOPED   LEFT HEART CATH AND CORONARY ANGIOGRAPHY N/A 02/05/2021   Procedure: LEFT HEART CATH AND CORONARY ANGIOGRAPHY;  Surgeon: Iran Ouch, MD;  Location: [facility];  Service: Cardiovascular;  Laterality: N/A;   NECK SURGERY  2002   POLYPECTOMY     ROTATOR CUFF REPAIR Left    TEE WITHOUT CARDIOVERSION N/A 04/04/2015   Procedure: TRANSESOPHAGEAL ECHOCARDIOGRAM (TEE);  Surgeon: Lewayne Bunting, MD;  Location: [facility];  Service: Cardiovascular;  Laterality: N/A;   TOTAL KNEE ARTHROPLASTY Left 09/28/2022   Procedure: TOTAL KNEE ARTHROPLASTY;  Surgeon: Joen Laura, MD;  Location: [facility];  Service: Orthopedics;  Laterality: Left;   TOTAL KNEE ARTHROPLASTY Right 01/11/2023   Procedure: TOTAL KNEE ARTHROPLASTY;  Surgeon: Joen Laura, MD;  Location: [facility];  Service: Orthopedics;  Laterality: Right;   TRIGGER FINGER RELEASE Left 12/28/2019   Procedure: RELEASE TRIGGER FINGER/A-1 PULLEY THUMB, MIDDLE AND RING;  Surgeon: Cindee Salt, MD;  Location: [facility];  Service: Orthopedics;  Laterality: Left;  FAB   UPPER GASTROINTESTINAL ENDOSCOPY     V Tach ablation  09/26/2015   Patient Active Problem List   Diagnosis Date Noted   CVA (cerebral vascular accident) (HCC) 01/15/2023   Primary osteoarthritis of right knee 01/11/2023   Localized osteoarthritis of right knee 01/11/2023   Primary osteoarthritis of left knee 09/28/2022   SBO (small bowel obstruction) (HCC) 05/29/2022   Chest pain 05/26/2022   Epigastric abdominal pain 05/26/2022   Coronary artery disease involving native coronary artery of native heart without angina pectoris 05/28/2021   History of myocardial infarction 02/05/2021   Parkinson's disease (HCC)    Malignant neoplasm of prostate (HCC) 11/01/2017   Essential tremor 12/06/2015   PVC (premature ventricular  contraction) 09/26/2015   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) 04/07/2015   Acute CVA (cerebrovascular accident) (HCC) 03/06/2015   Stroke (cerebrum) (HCC) 03/05/2015   Cerebral infarction (HCC) 03/05/2015   Weakness of right upper extremity    Tremor    Ejection fraction    Vertigo    Sleep apnea    Palpitations    Hyperlipidemia with target LDL less than 70    GERD (gastroesophageal reflux disease)    HTN (hypertension)    Chest pain    RBBB (right bundle branch block)    Ejection fraction    Carotid artery disease (HCC)     ONSET DATE: 03/01/2023 (Date of referral); 01/11/2023 date of stroke and RTKA, Dx PD 2017  REFERRING DIAG: G20.A1 (ICD-10-CM) - Parkinson's disease without dyskinesia or fluctuating manifestations (HCC) I63.89 (ICD-10-CM) - Cerebral infarction due to other mechanism  THERAPY DIAG:  Dysarthria and anarthria  Rationale for Evaluation and Treatment: Rehabilitation  SUBJECTIVE:   SUBJECTIVE STATEMENT: Endorsed completion of HEP  Pt accompanied by: self  PERTINENT HISTORY:   Patient went to the hospital for right knee replacement January 11, 2023.  Unfortunately, patient woke up from anesthesia with right-sided weakness.  MRI brain demonstrated acute infarct in the left precentral and middle frontal gyrus.  Patient was seen by Dr. Pearlean Brownie.  Patient was obviously off of the Plavix for the knee replacement.  His LDL was only 23.  His left ventricular ejection fraction was 50 to 55%.  CTA of the head and neck demonstrated no significant stenosis of the carotids.  There is mild to moderate left vertebral artery stenosis.  Patient subsequently went to inpatient rehab, primarily for the expressive aphasia and mild right hemiparesis. PMH + PD which was dx in 2017 - he has never received ST for PD.   PAIN:  Are you having pain? No  FALLS: Has patient fallen in last 6 months?  See PT evaluation for details - received PT for TKR at another clinic.  No PT here at this time.   LIVING ENVIRONMENT: Lives with: lives with their spouse Lives in: House/apartment  PLOF:  Level of assistance: Independent with ADLs, Independent with IADLs Employment: Retired  PATIENT GOALS: "I want to work on improving my speech and pronunciation"  OBJECTIVE:  Note: Objective measures were completed at Evaluation unless otherwise noted.  DIAGNOSTIC FINDINGS: Acute infarct of the left precentral gyrus and middle frontal gyrus with petechial hemorrhage at the infarct site. No mass effect or midline shift.  TREATMENT DATE: 04/19/23:  Pt shared daily completion of Speak Out! Program and endorses he is motivated to continue consistent practice.  SLP targeted improving vocal quality and increasing intensity using Speak Out! program, lesson 7. SLP leads pt through exercises, periodically providing model prior to pt execution. Occasional min-A required to achieve target dB this date. Averages this date:  Sustained "ah" 82 dB Reading: 80 dB Cognitive speech task: 77 dB Conversational activity: 73 dB with occasional cues to use an intentional voice and maintain volume Initiated education and training of throat clear alternatives as frequent throat throat clearing evidenced today. Usual mod A provided to utilize throat clear alternatives this date.   04/15/23: Pt shared daily completion of Speak Out! and endorses he is motivated to continue consistent practice.  SLP targeted improving vocal quality and increasing intensity using Speak Out! program, lesson 4. SLP lead pt through exercises periodically providing model prior to pt execution. occasional min-A required to achieve target dB this date. Averages this date:  sustained "ah" 80 dB reading 78 dB cognitive speech task 72 dB.  conversational activity: 71 dB with occasional cues to use an intentional  voice and maintain volume. SLP initiated unstructured conversation with pt regarding prior work experience with pt volume averaging at 68 dB. Pt benefiting from periodic min-A to increase volume and use clear speech. Intermittent hoarse vocal quality evidenced throughout today's session, with improvement noted with cued intentional speech.   04/11/33: Pt has completed Speak Out! Lessons, however he has not completed each lesson twice as prescribed. Reviewed this and provided home exercise log to help keep track of which lessons have been completed. He has completed some on line home practice sessions as well. Today targeted volume, intelligibility with Parkinson Voice Project (PVP) e Library April Lesson 1 - Nadine Counts required occasional modeling and verbal cues to hit target dB's on warm ups through counting, as well as to make exercises smooth and connected. With occasional min A Bob averaged target dB on reading and conversation exercises. In conversation he required usual mod A to maintain minimum of 70dB.   04/06/23: Received personal Speak Out! Workbook yesterday. Reported completion of online practice yesterday as well. Watched PD videos with some benefit to aid understanding of program. Re-educated rationale for Speak Out! Program and principles in light of PD and recent stroke impacting volume and speech intelligibility. Pt and wife verbalized understanding. Completed Lesson #2 today. ST leads pt through exercises providing usual model prior to pt execution. occasional min-A required to achieve target dB this date. Averages this date:  Sustained "ah" 83 dB Reading 76 dB Conversation speech task 73 dB.  Conversational sample of approx 5 minutes, pt averages 71 dB with occasional min-A to maintain targeted intensity. Provided PD wallet card as pt still occasionally driving. Briefly reviewed anomia strategies, with pt reporting use of extra time and circumlocution when word finding episodes occur. Reportedly  has instructed family members to allow him extra time and not provide targeted word.   03/29/23 (eval completed): Initiated training in compensations for dysarthria by speaking with intent - he required frequent modeling and verbal cues to read conversational sentences with intent to improve voice quality and articulation. Introduced [employer] and demonstrated where to find the required [employer]. Ordered Speak Out! Work book. Initiated training in compensations for slow processing to improve participation in conversations with family members. See Patient Instructions. Introduced Careers adviser for word finding by modeling  3 examples.  PATIENT EDUCATION: Education details:  See Treatment note, See Patient Instructions, HEP for dysarthria, compensations for slow processing, compensations for dysarthria Person educated: Patient Education method: Explanation, Demonstration, Verbal cues, and Handouts Education comprehension: verbalized understanding, returned demonstration, verbal cues required, and needs further education  HOME EXERCISE PROGRAM: Speak Out!    GOALS: Goals reviewed with patient? Yes  SHORT TERM GOALS: Target date: 04/26/23  Pt will complete HEP for dysarthria with occasional min A Baseline: Goal status: MET  2.  Pt will complete at least 1 on line home practice session on the Parkinson Voice Project website Baseline:  Goal status: MET  3.  Pt will complete moderately complex naming task with 90% accuracy and occasional min A Baseline:  Goal status: MET  4.  Pt will average 72dB 18/20 sentences with rare min A Baseline:  Goal status: MET  5.  Pt will employ verbal compensations for word finding in structured tasks 3/5 opportunities with occasional min A Baseline:  Goal status: IN PROGRESS   LONG TERM GOALS: Target date: 05/24/23  Pt will complete HEP for dysarthria with mod I Baseline:  Goal status: IN PROGRESS  2.  Pt will complete  HEP for dysarthria 2x a day 5/7 days over 1 week Baseline:  Goal status: IN PROGRESS  3.  Pt will access on line eLibrary for home practice 2x with rare min A Baseline:  Goal status: IN PROGRESS  4.  Pt & family will carryover 3 compensatory strategies to support auditory comprehension/slow processing in group and phone conversations Baseline:  Goal status: IN PROGRESS  5.  Pt will average 70dB over 15 minute conversation with rare min A Baseline:  Goal status: IN PROGRESS  6.  Pt will improve score on Communication Participation Item Bank by 2 points Baseline: 14 Goal status: IN PROGRESS  ASSESSMENT:  CLINICAL IMPRESSION: Patient is a 78 y.o. male who was seen today for mild expressive language impairment and mild hypokinetic dysarthria. Dysarthria exacerbate by recent CVA and Nadine Counts feels his speech is more "jumpy" and "slurred" since the CVA. Continued instruction of Speak Out! Program and principle of intent to maximize volume and speech clarity as well as anomia compensations to aid word retrieval. I recommend skilled ST to maximize communication for safety, independence and QOL.    OBJECTIVE IMPAIRMENTS: Objective impairments include expressive language and dysarthria. These impairments are limiting patient from effectively communicating at home and in community.Factors affecting potential to achieve goals and functional outcome are co-morbidities.. Patient will benefit from skilled SLP services to address above impairments and improve overall function.  REHAB POTENTIAL: Good  PLAN:  SLP FREQUENCY: 2x/week  SLP DURATION: 8 weeks  PLANNED INTERVENTIONS: Aspiration precaution training, Diet toleration management , Language facilitation, Environmental controls, Cueing hierachy, Cognitive reorganization, Internal/external aids, Functional tasks, Multimodal communication approach, SLP instruction and feedback, Compensatory strategies, and Patient/family education, MBSS if  indicated    Gracy Racer, CCC-SLP 04/19/2023, 2:04 PM

## 2023-04-19 NOTE — Telephone Encounter (Signed)
   Pre-operative Risk Assessment    Patient Name: Joshua [name]  DOB: 09/09/1945 MRN: [id number]   Date of last office visit: 12/08/22 WITH DR. Anne Fu Date of next office visit: 04/29/23 WITH DR. Anne Fu   Request for Surgical Clearance    Procedure:   RIGHT L2-L3 TFESI  Date of Surgery:  Clearance TBD                                Surgeon:  DR. Lorrine Kin Surgeon's Group or Practice Name:  [facility] Phone number:  [phone] Fax number:  [phone]   Type of Clearance Requested:   - Pharmacy:  Hold Clopidogrel (Plavix) 7 DAYS PRIOR AND RESUME THE DAY AFTER PROCEDURE    Type of Anesthesia:  Not Indicated   Additional requests/questions:    SignedMichaelle Copas   04/19/2023, 5:37 PM

## 2023-04-19 NOTE — Patient Instructions (Signed)
 Performing Daily Activities with Big Movements  Pick at least 2 activities a day and perform with BIG, DELIBERATE movements/effort.  Try different activities each day. This can make the activity easier and turn daily activities into exercise to prevent problems in the future!  If you are standing during the activity, make sure to keep feet apart and stand with good/big/posture.  Examples: Dressing  Pull-over shirt:  good posture, bring shirt to head (don't bring head down), deliberately push arms into sleeves Jacket:  stand with feet apart, twist when putting on jacket, deliberate push arms into sleeves Underwear/Pants:  Sit, lean forward, and push foot into pants deliberately Open hands to pull down shirt/put on socks/pull up pants--get more material in your hand  Buttoning - Open hands big before fastening each button, deliberate movement (angry buttons--push button through hole), unfasten by using pull-push method   Bathing - Wash/dry with long strokes  Brushing your teeth - Big, slow movements  Cutting food - Long deliberate cuts, put tip of knife down in front of food  Eating - Hold utensil in the middle (not the end), hold fork straight up and down to stab food  Picking up a cup/bottle - Open hand up big and get object all the way in palm  Opening jar/bottle - Move as much as you can with each turn, twist wrist  Putting on seatbelt - Feet apart, twist when reaching, look at where you are reaching  Hanging up clothes/getting clothes down from closet - Reach with big effort, open hand, straighten elbow  Putting away groceries/dishes - Reach with big effort  Wiping counter/table - Move in big, long strokes, open hand  Stirring while cooking - Exaggerate movement  Cleaning windows - Feet apart, move in big, long strokes  Sweeping/Raking- Feet apart and one foot in front of the other, move arms in big, long strokes  Vacuuming - Feet apart and one in front of the other,  push with big movement  Folding clothes - Exaggerate arm movements  Washing car - Move in big, long strokes, feet apart  Changing light bulb or Using a screwdriver - Move as much as you can with each turn, twist wrist  Walking into a store/restaurant - Walk with big steps, good posture, swing arms if able  Standing up from a chair/recliner/sofa - Scoot forward, lean forward, and stand with big effort  Picking up something from floor/reaching in low cabinet - Get close to object, Position feet apart and one in front of the other.   Ways to prevent future Parkinson's related complications:  1.   Exercise regularly/daily.    2.   Focus on BIGGER movements during daily activities- really reach overhead, straighten elbows and extend fingers  3.   When dressing (especially jacket/coat) or reaching for your seatbelt make sure to use your body to assist by twisting while you reach and looking at where you are reaching - this can help to minimize stress on the shoulder and reduce the risk of a rotator cuff tear  4.   Swing your arms when you walk (unless using walker)! People with PD are at increased risk for frozen shoulder and swinging your arms can reduce this risk.  5. Drink plenty of water and eat a high fiber diet (fresh fruits and veggies, nuts/seeds, fish). Avoid canned foods, red meats, and dairy when possible  6. Do NOT take your Parkinson's medication around meals (take at least 30 min before a meal, or 1  hour after a meal), especially protein as it blocks the absorption of the medicine and will not work effectively  7. Keep your feet apart when you are standing (wider stance) to allow you to have better balance and to reach further with your arms. Also make sure your feet are apart before standing up.

## 2023-04-20 NOTE — Telephone Encounter (Signed)
   Name: Stark Aguinaga  DOB: 07/09/1945  MRN: [id number]  Primary Cardiologist: Donato Schultz, MD  Chart reviewed as part of pre-operative protocol coverage. The patient has an upcoming visit scheduled with Dr. Anne Fu on 04/29/2023 at which time clearance can be addressed in case there are any issues that would impact surgical recommendations.  Right L2-L3 TFESI is not scheduled until TBD as below. I added preop FYI to appointment note so that provider is aware to address at time of outpatient visit.  Per office protocol the cardiology provider should forward their finalized clearance decision and recommendations regarding antiplatelet therapy to the requesting party below.    I will route this message as FYI to requesting party and remove this message from the preop box as separate preop APP input not needed at this time.   Please call with any questions.  Denyce Antrell, NP  04/20/2023, 8:25 AM

## 2023-04-22 ENCOUNTER — Ambulatory Visit: Payer: Medicare HMO | Admitting: Occupational Therapy

## 2023-04-29 ENCOUNTER — Encounter (HOSPITAL_BASED_OUTPATIENT_CLINIC_OR_DEPARTMENT_OTHER): Payer: Self-pay | Admitting: Cardiology

## 2023-04-29 ENCOUNTER — Encounter (HOSPITAL_BASED_OUTPATIENT_CLINIC_OR_DEPARTMENT_OTHER): Payer: Self-pay | Admitting: *Deleted

## 2023-04-29 ENCOUNTER — Ambulatory Visit (INDEPENDENT_AMBULATORY_CARE_PROVIDER_SITE_OTHER): Admitting: Cardiology

## 2023-04-29 VITALS — BP 108/70 | HR 84 | Ht 73.0 in | Wt 224.6 lb

## 2023-04-29 DIAGNOSIS — I2510 Atherosclerotic heart disease of native coronary artery without angina pectoris: Secondary | ICD-10-CM

## 2023-04-29 DIAGNOSIS — Z01818 Encounter for other preprocedural examination: Secondary | ICD-10-CM | POA: Diagnosis not present

## 2023-04-29 NOTE — Progress Notes (Signed)
 Cardiology Office Note:  .   Date:  04/29/2023  ID:  Joshua [name], DOB 08-Dec-1945, MRN [id number] PCP: [name], MD   HeartCare Providers Cardiologist:  Donato Schultz, MD Electrophysiologist:  Will Jorja Loa, MD    History of Present Illness: .   Joshua [name] is a 78 y.o. male Discussed the use of AI scribe software for clinical note transcription with the patient, who gave verbal consent to proceed.  History of Present Illness Joshua [name] is a 78 year old male with coronary artery disease and Parkinson's disease who presents for preoperative cardiac risk assessment for lumbar spine surgery.  He has a history of coronary artery disease, non-ST elevation myocardial infarction (NSTEMI), right bundle branch block, and high burdens of premature ventricular contractions (PVCs). He underwent an ablation procedure for PVCs, which initially showed improvement, but the PVCs later returned. He was started on flecainide, which was later switched to mexiletine due to the development of coronary artery disease and NSTEMI. He experienced gastrointestinal reflux disease (GERD) symptoms with mexiletine at 250 mg, but these improved at a reduced dose of 150 mg. His most recent cardiac event monitor on March 04, 2023, showed no atrial fibrillation. An echocardiogram in January 2025 after a stroke showed an ejection fraction (EF) of 50-55%.  He has a history of stroke with transesophageal echocardiogram (TEE) showing a patent foramen ovale (PFO) and aortic valve vegetation, later identified as Lambl's excrescence. A link monitor previously showed a 26% PVC burden, which has since been explanted. His ejection fraction has varied from 48% on a recent stress test to 35-45% on catheterization, but an echocardiogram in 2023 showed an EF of 65%.  He is currently on Plavix 75 mg daily, metoprolol 12.5 mg daily, and rosuvastatin 20 mg daily. His last low-density lipoprotein  (LDL) was 43, which is considered excellent. He also takes midodrine 5 mg twice a day as needed for low blood pressure.  He has undergone knee replacements in the past and has Parkinson's disease, which has shown improvement with medication. He reports a recent episode of severe pain lasting about 14 days, particularly at night, affecting his arm and requiring him to cover his face with a pillow due to the intensity of the pain. He is awaiting an MRI in about a month to further evaluate his condition.  No current chest pain and his blood pressure is stable without dizziness. He notes that he has not experienced any PVCs recently.       Studies Reviewed: [name]   EKG Interpretation Date/Time:  Thursday April 29 2023 10:53:16 EDT Ventricular Rate:  79 PR Interval:  202 QRS Duration:  136 QT Interval:  422 QTC Calculation: 483 R Axis:   -3  Text Interpretation: Normal sinus rhythm Right bundle branch block When compared with ECG of 26-May-2022 09:37, No significant change since last tracing Confirmed by Donato Schultz ([id number]) on 04/29/2023 10:57:00 AM    Results LABS LDL: 51  DIAGNOSTIC Echocardiogram: EF 50-55% (January 2025) EKG: Normal sinus rhythm, 79 bpm, right bundle branch block (04/29/2023) Cardiac event monitor: No atrial fibrillation (03/04/2023) Echocardiogram: EF 65% (2023) Cardiac catheterization: EF 35-45% Stress test: EF 48% TEE: PFO, aortic valve vegetation Risk Assessment/Calculations:            Physical Exam:   VS:  BP 108/70   Pulse 84   Ht 6\' 1"  (1.854 m)   Wt 224 lb 9.6 oz (101.9 kg)   SpO2 95%   BMI  29.63 kg/m    Wt Readings from Last 3 Encounters:  04/29/23 224 lb 9.6 oz (101.9 kg)  03/16/23 231 lb 3.2 oz (104.9 kg)  03/01/23 229 lb (103.9 kg)    GEN: Well nourished, well developed in no acute distress NECK: No JVD; No carotid bruits CARDIAC: RRR, no murmurs, no rubs, no gallops RESPIRATORY:  Clear to auscultation without rales, wheezing or rhonchi   ABDOMEN: Soft, non-tender, non-distended EXTREMITIES:  No edema; No deformity, tremor noted  ASSESSMENT AND PLAN: .    Assessment and Plan Assessment & Plan Preoperative cardiac risk assessment for lumbar spine surgery Undergoing preoperative cardiac risk assessment for planned lumbar spine surgery. Coronary artery disease with moderate lesions, non-ST elevation myocardial infarction, and right bundle branch block. Ejection fraction 50-55% on recent echocardiogram. Asymptomatic with no chest pain and normal sinus rhythm on EKG. Potential need to hold Plavix prior to surgery discussed. - Order Lexiscan stress test to assess cardiac risk prior to surgery - Consider holding Plavix one week prior to surgery if required by surgical team  Coronary Artery Disease (CAD) CAD with 70% lesions in three vessels.  Diagnostic Dominance: Right  On Plavix and rosuvastatin. LDL controlled at 43 mg/dL. Previous heart catheterization showed moderate lesions. Lexiscan stress test planned to reassess cardiac status and ensure no high-risk factors prior to surgery. - Continue Plavix 75 mg oral daily - Continue rosuvastatin 20 mg oral daily  Premature Ventricular Contractions (PVCs) High burden PVCs, previously treated with ablation and managed with mexiletine and metoprolol. PVC burden improved from 26% with current regimen. EKG today showed no PVCs, indicating good control. - Continue mexiletine 150 mg oral twice daily - Continue metoprolol 12.5 mg oral daily  Stroke Stroke following knee replacement surgery. TEE showed patent foramen ovale and aortic valve vegetation, identified as Lambl's excrescence. On Plavix for secondary prevention. - Continue Plavix 75 mg oral daily  Parkinson's Disease Parkinson's disease with improvement on medication. - Continue current Parkinson's medication regimen  Follow-up Requires follow-up to monitor cardiac status and manage conditions. Surgery dates and  requirements for holding medications need clarification. - Schedule follow-up appointment in one year with an APP          Signed, Dorothye Gathers, MD

## 2023-04-29 NOTE — Patient Instructions (Signed)
 Medication Instructions:  The current medical regimen is effective;  continue present plan and medications.  *If you need a refill on your cardiac medications before your next appointment, please call your pharmacy*  Testing/Procedures: You are being scheduled for a Lexiscan Myoview.  Please follow the instruction sheet given.  Follow-Up: At [facility], you and your health needs are our priority.  As part of our continuing mission to provide you with exceptional heart care, our providers are all part of one team.  This team includes your primary Cardiologist (physician) and Advanced Practice Providers or APPs (Physician Assistants and Nurse Practitioners) who all work together to provide you with the care you need, when you need it.  Your next appointment:   1 year(s)  Provider:   One of our Advanced Practice Providers (APPs): Melita Springer, PA-C  Friddie Jetty, NP Lawana Pray, NP  Theotis Flake, PA-C Lovette Rud, PA-C  [place], PA-C Charles Connor, NP  Ervin Heath, PA-C [place], PA-C  Marlana Silvan, NP    Dayna Dunn, PA-C  Taylor Parcells, PA-C Madison Fountain, NP Marlyse Single, PA-C Callie Goodrich, PA-C  Katlyn West, NP Sheng Haley, PA-C  Evan Williams, PA-C Kathleen Johnson, PA-C Xika Zhao, NP      We recommend signing up for the patient portal called "MyChart".  Sign up information is provided on this After Visit Summary.  MyChart is used to connect with patients for Virtual Visits (Telemedicine).  Patients are able to view lab/test results, encounter notes, upcoming appointments, etc.  Non-urgent messages can be sent to your provider as well.   To learn more about what you can do with MyChart, go to [email or web address].

## 2023-05-03 ENCOUNTER — Encounter (HOSPITAL_COMMUNITY): Payer: Self-pay

## 2023-05-03 ENCOUNTER — Ambulatory Visit: Payer: Self-pay | Admitting: Speech Pathology

## 2023-05-04 ENCOUNTER — Ambulatory Visit (HOSPITAL_COMMUNITY): Attending: Cardiology

## 2023-05-04 ENCOUNTER — Encounter (HOSPITAL_COMMUNITY): Payer: Self-pay | Admitting: *Deleted

## 2023-05-04 DIAGNOSIS — I2510 Atherosclerotic heart disease of native coronary artery without angina pectoris: Secondary | ICD-10-CM | POA: Diagnosis not present

## 2023-05-04 DIAGNOSIS — Z01818 Encounter for other preprocedural examination: Secondary | ICD-10-CM | POA: Insufficient documentation

## 2023-05-04 DIAGNOSIS — Z01810 Encounter for preprocedural cardiovascular examination: Secondary | ICD-10-CM | POA: Diagnosis not present

## 2023-05-04 MED ORDER — TECHNETIUM TC 99M TETROFOSMIN IV KIT
10.1000 | PACK | Freq: Once | INTRAVENOUS | Status: AC | PRN
  Administered 2023-05-04: 10.1 via INTRAVENOUS

## 2023-05-05 ENCOUNTER — Ambulatory Visit (HOSPITAL_COMMUNITY): Attending: Cardiology

## 2023-05-05 ENCOUNTER — Ambulatory Visit: Payer: Self-pay | Admitting: Speech Pathology

## 2023-05-05 ENCOUNTER — Encounter (HOSPITAL_BASED_OUTPATIENT_CLINIC_OR_DEPARTMENT_OTHER): Payer: Self-pay | Admitting: Cardiology

## 2023-05-05 DIAGNOSIS — Z01818 Encounter for other preprocedural examination: Secondary | ICD-10-CM | POA: Diagnosis not present

## 2023-05-05 DIAGNOSIS — R471 Dysarthria and anarthria: Secondary | ICD-10-CM

## 2023-05-05 DIAGNOSIS — I2510 Atherosclerotic heart disease of native coronary artery without angina pectoris: Secondary | ICD-10-CM | POA: Insufficient documentation

## 2023-05-05 DIAGNOSIS — Z01810 Encounter for preprocedural cardiovascular examination: Secondary | ICD-10-CM | POA: Insufficient documentation

## 2023-05-05 LAB — MYOCARDIAL PERFUSION IMAGING
Base ST Depression (mm): 0 mm
LV dias vol: 97 mL (ref 62–150)
LV sys vol: 49 mL
Nuc Stress EF: 50 %
Peak HR: 90 {beats}/min
Rest HR: 69 {beats}/min
Rest Nuclear Isotope Dose: 10.1 mCi
SDS: 2
SRS: 0
SSS: 2
ST Depression (mm): 0 mm
TID: 0.9

## 2023-05-05 MED ORDER — REGADENOSON 0.4 MG/5ML IV SOLN
0.4000 mg | Freq: Once | INTRAVENOUS | Status: AC
  Administered 2023-05-05: 0.4 mg via INTRAVENOUS

## 2023-05-05 MED ORDER — TECHNETIUM TC 99M TETROFOSMIN IV KIT
32.3000 | PACK | Freq: Once | INTRAVENOUS | Status: AC | PRN
  Administered 2023-05-05: 32.3 via INTRAVENOUS

## 2023-05-05 NOTE — Therapy (Signed)
 OUTPATIENT SPEECH LANGUAGE PATHOLOGY PARKINSON'S TREATMENT & DISCHARGE SUMMARY   Patient Name: Joshua [name] MRN: [id number] DOB:[birth year], 78 y.o., male Today's Date: 05/05/2023  PCP: Joshua Milroy, MD REFERRING PROVIDER: Shirline Dover, DO  END OF SESSION:  End of Session - 05/05/23 0940     Visit Number 6    Number of Visits 17    Date for SLP Re-Evaluation 05/24/23    SLP Start Time 0940   pt in restroom   SLP Stop Time  1015    SLP Time Calculation (min) 35 min    Activity Tolerance Patient tolerated treatment well                Past Medical History:  Diagnosis Date   Allergic rhinitis    Anxiety    from chronic pain from surgery- on Cymbalta    Arthritis    Asthma    Barrett's esophagus 03/29/2014   Carotid artery disease (HCC)    Carotid Doppler normal August, 2007   Coronary artery disease    Diverticulosis    Dyslipidemia    GERD (gastroesophageal reflux disease)    History of loop recorder    has since 04/04/15   HTN (hypertension)    takes Metoprolol  for PVC control   Hx of colonic polyps    adenomatous   IFG (impaired fasting glucose)    Myocardial infarction (HCC)    mild   Neuromuscular disorder (HCC)    parkinson's tremors in hands   Palpitations    Benign PVCs   Parkinson disease (HCC)    Pneumonia    Prostate cancer (HCC)    RBBB (right bundle branch block)    rate related   Shingles    Stroke (HCC) 2017   TIA (transient ischemic attack) 02/2015   Per pt, had 2 strokes   Vertigo    Past Surgical History:  Procedure Laterality Date   BACK SURGERY  2002,2009   x 6   CHOLECYSTECTOMY  11/30/2012   with IOC   COLONOSCOPY     ELECTROPHYSIOLOGIC STUDY N/A 09/26/2015   Procedure: V Tach Ablation (PVC);  Surgeon: Joshua Cortland Ding, MD;  Location: [facility];  Service: Cardiovascular;  Laterality: N/A;   EP IMPLANTABLE DEVICE N/A 04/04/2015   Procedure: Loop Recorder Insertion;  Surgeon: Joshua Cortland Ding,  MD;  Location: [facility];  Service: Cardiovascular;  Laterality: N/A;   HERNIA REPAIR     laprascopic   KNEE SURGERY     DECEMBER 2017,LEFT KNEE SCOPED   LEFT HEART CATH AND CORONARY ANGIOGRAPHY N/A 02/05/2021   Procedure: LEFT HEART CATH AND CORONARY ANGIOGRAPHY;  Surgeon: Joshua Hamilton, MD;  Location: [facility];  Service: Cardiovascular;  Laterality: N/A;   NECK SURGERY  2002   POLYPECTOMY     ROTATOR CUFF REPAIR Left    TEE WITHOUT CARDIOVERSION N/A 04/04/2015   Procedure: TRANSESOPHAGEAL ECHOCARDIOGRAM (TEE);  Surgeon: Joshua Quince, MD;  Location: [facility];  Service: Cardiovascular;  Laterality: N/A;   TOTAL KNEE ARTHROPLASTY Left 09/28/2022   Procedure: TOTAL KNEE ARTHROPLASTY;  Surgeon: Joshua Arms, MD;  Location: [facility];  Service: Orthopedics;  Laterality: Left;   TOTAL KNEE ARTHROPLASTY Right 01/11/2023   Procedure: TOTAL KNEE ARTHROPLASTY;  Surgeon: Joshua Arms, MD;  Location: [facility];  Service: Orthopedics;  Laterality: Right;   TRIGGER FINGER RELEASE Left 12/28/2019   Procedure: RELEASE TRIGGER FINGER/A-1 PULLEY THUMB, MIDDLE AND RING;  Surgeon: Joshua [name],  Joshua Junker, MD;  Location: [facility];  Service: Orthopedics;  Laterality: Left;  FAB   UPPER GASTROINTESTINAL ENDOSCOPY     V Tach ablation  09/26/2015   Patient Active Problem List   Diagnosis Date Noted   CVA (cerebral vascular accident) (HCC) 01/15/2023   Primary osteoarthritis of right knee 01/11/2023   Localized osteoarthritis of right knee 01/11/2023   Primary osteoarthritis of left knee 09/28/2022   SBO (small bowel obstruction) (HCC) 05/29/2022   Chest pain 05/26/2022   Epigastric abdominal pain 05/26/2022   Coronary artery disease involving native coronary artery of native heart without angina pectoris 05/28/2021   History of myocardial infarction 02/05/2021   Parkinson's disease (HCC)    Malignant neoplasm of prostate (HCC) 11/01/2017   Essential tremor  12/06/2015   PVC (premature ventricular contraction) 09/26/2015   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) 04/07/2015   Acute CVA (cerebrovascular accident) (HCC) 03/06/2015   Stroke (cerebrum) (HCC) 03/05/2015   Cerebral infarction (HCC) 03/05/2015   Weakness of right upper extremity    Tremor    Ejection fraction    Vertigo    Sleep apnea    Palpitations    Hyperlipidemia with target LDL less than 70    GERD (gastroesophageal reflux disease)    HTN (hypertension)    Chest pain    RBBB (right bundle branch block)    Ejection fraction    Carotid artery disease (HCC)     ONSET DATE: 03/01/2023 (Date of referral); 01/11/2023 date of stroke and RTKA, Dx PD 2017  REFERRING DIAG: G20.A1 (ICD-10-CM) - Parkinson's disease without dyskinesia or fluctuating manifestations (HCC) I63.89 (ICD-10-CM) - Cerebral infarction due to other mechanism  THERAPY DIAG:  Dysarthria and anarthria  Rationale for Evaluation and Treatment: Rehabilitation  SUBJECTIVE:   SUBJECTIVE STATEMENT: Endorsed completion of HEP  Pt accompanied by: self  PERTINENT HISTORY:   Patient went to the hospital for right knee replacement January 11, 2023.  Unfortunately, patient woke up from anesthesia with right-sided weakness.  MRI brain demonstrated acute infarct in the left precentral and middle frontal gyrus.  Patient was seen by Dr. Janett [name].  Patient was obviously off of the Plavix  for the knee replacement.  His LDL was only 23.  His left ventricular ejection fraction was 50 to 55%.  CTA of the head and neck demonstrated no significant stenosis of the carotids.  There is mild to moderate left vertebral artery stenosis.  Patient subsequently went to inpatient rehab, primarily for the expressive aphasia and mild right hemiparesis. PMH + PD which was dx in 2017 - he has never received ST for PD.   PAIN:  Are you having pain? No  FALLS: Has patient fallen in last 6 months?  See PT evaluation for details -  received PT for TKR at another clinic. No PT here at this time.   LIVING ENVIRONMENT: Lives with: lives with their spouse Lives in: House/apartment  PLOF:  Level of assistance: Independent with ADLs, Independent with IADLs Employment: Retired  PATIENT GOALS: "I want to work on improving my speech and pronunciation"  OBJECTIVE:  Note: Objective measures were completed at Evaluation unless otherwise noted.  DIAGNOSTIC FINDINGS: Acute infarct of the left precentral gyrus and middle frontal gyrus with petechial hemorrhage at the infarct site. No mass effect or midline shift.  TREATMENT DATE:  05/05/23: [place] continues to report consistent completion of Speak Out! Using workbook and online home practice sessions. Today we targeted volume, intelligibility using [facility] [employer] [place]  Lesson: [employer], as his grandson is a Naval architect for little league. [place] hit all dB targets for all exercises, he averaged 85dB singing, 75dB reading complex multi-paragraph passage and 74 in conversation exercises re: baseball with supervision cues. He called [facility] [employer] to ask about an email to set up eLibrary, however he did not complete this. I re-sent request for eLibrary access to [facility] - [place] did not bring his phone into ST to see if he received an email link to set up eLibrary. Goals met, d/c ST - Bob verbalizes need to continue with HEP upon d/c.  04/19/23:  Pt shared daily completion of Speak Out! Program and endorses he is motivated to continue consistent practice.  SLP targeted improving vocal quality and increasing intensity using Speak Out! program, lesson 7. SLP leads pt through exercises, periodically providing model prior to pt execution. Occasional min-A required to achieve target dB this date. Averages this date:  Sustained "ah" 82 dB Reading: 80 dB Cognitive  speech task: 77 dB Conversational activity: 73 dB with occasional cues to use an intentional voice and maintain volume Initiated education and training of throat clear alternatives as frequent throat throat clearing evidenced today. Usual mod A provided to utilize throat clear alternatives this date.   04/15/23: Pt shared daily completion of Speak Out! and endorses he is motivated to continue consistent practice.  SLP targeted improving vocal quality and increasing intensity using Speak Out! program, lesson 4. SLP lead pt through exercises periodically providing model prior to pt execution. occasional min-A required to achieve target dB this date. Averages this date:  sustained "ah" 80 dB reading 78 dB cognitive speech task 72 dB.  conversational activity: 71 dB with occasional cues to use an intentional voice and maintain volume. SLP initiated unstructured conversation with pt regarding prior work experience with pt volume averaging at 68 dB. Pt benefiting from periodic min-A to increase volume and use clear speech. Intermittent hoarse vocal quality evidenced throughout today's session, with improvement noted with cued intentional speech.   04/11/33: Pt has completed Speak Out! Lessons, however he has not completed each lesson twice as prescribed. Reviewed this and provided home exercise log to help keep track of which lessons have been completed. He has completed some on line home practice sessions as well. Today targeted volume, intelligibility with [facility] [employer] ([facility]) e Library April Lesson 1 - [place] required occasional modeling and verbal cues to hit target dB's on warm ups through counting, as well as to make exercises smooth and connected. With occasional min A Bob averaged target dB on reading and conversation exercises. In conversation he required usual mod A to maintain minimum of 70dB.   04/06/23: Received personal Speak Out! Workbook yesterday. Reported completion of online practice  yesterday as well. Watched PD videos with some benefit to aid understanding of program. Re-educated rationale for Speak Out! Program and principles in light of PD and recent stroke impacting volume and speech intelligibility. Pt and wife verbalized understanding. Completed Lesson #2 today. ST leads pt through exercises providing usual model prior to pt execution. occasional min-A required to achieve target dB this date. Averages this date:  Sustained "ah" 83 dB Reading 76 dB Conversation speech task 73 dB.  Conversational sample of approx 5 minutes, pt averages 71 dB with occasional min-A to  maintain targeted intensity. Provided PD wallet card as pt still occasionally driving. Briefly reviewed anomia strategies, with pt reporting use of extra time and circumlocution when word finding episodes occur. Reportedly has instructed family members to allow him extra time and not provide targeted word.   03/29/23 (eval completed): Initiated training in compensations for dysarthria by speaking with intent - he required frequent modeling and verbal cues to read conversational sentences with intent to improve voice quality and articulation. Introduced [employer] and demonstrated where to find the required [employer]. Ordered Speak Out! Work book. Initiated training in compensations for slow processing to improve participation in conversations with family members. See Patient Instructions. Introduced Careers adviser for word finding by modeling  3 examples.  PATIENT EDUCATION: Education details: See Treatment note, See Patient Instructions, HEP for dysarthria, compensations for slow processing, compensations for dysarthria Person educated: Patient Education method: Explanation, Demonstration, Verbal cues, and Handouts Education comprehension: verbalized understanding, returned demonstration, verbal cues required, and needs further education  HOME EXERCISE PROGRAM: Speak Out!     GOALS: Goals reviewed with patient? Yes  SHORT TERM GOALS: Target date: 04/26/23  Pt Joshua complete HEP for dysarthria with occasional min A Baseline: Goal status: MET  2.  Pt Joshua complete at least 1 on line home practice session on the [employer] website Baseline:  Goal status: MET  3.  Pt Joshua complete moderately complex naming task with 90% accuracy and occasional min A Baseline:  Goal status: MET  4.  Pt Joshua average 72dB 18/20 sentences with rare min A Baseline:  Goal status: MET  5.  Pt Joshua employ verbal compensations for word finding in structured tasks 3/5 opportunities with occasional min A Baseline:  Goal status: IN PROGRESS   LONG TERM GOALS: Target date: 05/24/23  Pt Joshua complete HEP for dysarthria with mod I Baseline:  Goal status: MET  2.  Pt Joshua complete HEP for dysarthria 2x a day 5/7 days over 1 week Baseline:  Goal status: MET  3.  Pt Joshua access on line eLibrary for home practice 2x with rare min A Baseline:  Goal status: NOT MET  4.  Pt & family Joshua carryover 3 compensatory strategies to support auditory comprehension/slow processing in group and phone conversations Baseline:  Goal status: MET  5.  Pt Joshua average 70dB over 15 minute conversation with rare min A Baseline:  Goal status: MET  6.  Pt Joshua improve score on Communication Participation Item Bank by 2 points Baseline: 14 Goal status: MET  ASSESSMENT:  CLINICAL IMPRESSION: Patient is a 78 y.o. male who was seen today for mild expressive language impairment and mild hypokinetic dysarthria. Dysarthria exacerbate by recent CVA and [place] feels his speech is more "jumpy" and "slurred" since the CVA. Continued instruction of Speak Out! Program and principle of intent to maximize volume and speech clarity as well as anomia compensations to aid word retrieval. I recommend skilled ST to maximize communication for safety, independence and QOL.    OBJECTIVE  IMPAIRMENTS: Objective impairments include expressive language and dysarthria. These impairments are limiting patient from effectively communicating at home and in community.Factors affecting potential to achieve goals and functional outcome are co-morbidities.. Patient Joshua benefit from skilled SLP services to address above impairments and improve overall function.  REHAB POTENTIAL: Good  PLAN:  SLP FREQUENCY: 2x/week  SLP DURATION: 8 weeks  PLANNED INTERVENTIONS: Aspiration precaution training, Diet toleration management , Language facilitation, Environmental controls, Cueing hierachy, Cognitive reorganization, Internal/external aids,  Functional tasks, Multimodal communication approach, SLP instruction and feedback, Compensatory strategies, and Patient/family education, MBSS if indicated  SPEECH THERAPY DISCHARGE SUMMARY  Visits from Start of Care: 6  Current functional level related to goals / functional outcomes: See goals above   Remaining deficits: Mild hypokinetic dysarthria   Education / Equipment: HEP for dysarthria; compensations for dysarthria   Patient agrees to discharge. Patient goals were met. Patient is being discharged due to meeting the stated rehab goals..     Diani Jillson Ann, CCC-SLP 05/05/2023, 10:55 AM

## 2023-05-10 ENCOUNTER — Ambulatory Visit: Payer: Self-pay | Admitting: Speech Pathology

## 2023-05-15 ENCOUNTER — Ambulatory Visit
Admission: RE | Admit: 2023-05-15 | Discharge: 2023-05-15 | Disposition: A | Discharge status: Home / Self Care | Source: Ambulatory Visit | Attending: Student | Admitting: Student

## 2023-05-15 DIAGNOSIS — M5416 Radiculopathy, lumbar region: Secondary | ICD-10-CM

## 2023-05-15 MED ORDER — GADOPICLENOL 0.5 MMOL/ML IV SOLN
10.0000 mL | Freq: Once | INTRAVENOUS | Status: AC | PRN
  Administered 2023-05-15: 10 mL via INTRAVENOUS

## 2023-05-17 ENCOUNTER — Encounter: Payer: Self-pay | Admitting: Speech Pathology

## 2023-05-30 ENCOUNTER — Other Ambulatory Visit: Payer: Self-pay | Admitting: Neurology

## 2023-06-12 ENCOUNTER — Encounter (HOSPITAL_BASED_OUTPATIENT_CLINIC_OR_DEPARTMENT_OTHER): Payer: Self-pay | Admitting: Emergency Medicine

## 2023-06-12 ENCOUNTER — Other Ambulatory Visit: Payer: Self-pay

## 2023-06-12 ENCOUNTER — Emergency Department (HOSPITAL_BASED_OUTPATIENT_CLINIC_OR_DEPARTMENT_OTHER): Admission: EM | Admit: 2023-06-12 | Discharge: 2023-06-12 | Disposition: A | Discharge status: Home / Self Care

## 2023-06-12 ENCOUNTER — Emergency Department (HOSPITAL_BASED_OUTPATIENT_CLINIC_OR_DEPARTMENT_OTHER)

## 2023-06-12 DIAGNOSIS — Z7901 Long term (current) use of anticoagulants: Secondary | ICD-10-CM | POA: Insufficient documentation

## 2023-06-12 DIAGNOSIS — S0990XA Unspecified injury of head, initial encounter: Secondary | ICD-10-CM | POA: Insufficient documentation

## 2023-06-12 DIAGNOSIS — G20C Parkinsonism, unspecified: Secondary | ICD-10-CM | POA: Diagnosis not present

## 2023-06-12 DIAGNOSIS — Z23 Encounter for immunization: Secondary | ICD-10-CM | POA: Insufficient documentation

## 2023-06-12 DIAGNOSIS — W228XXA Striking against or struck by other objects, initial encounter: Secondary | ICD-10-CM | POA: Insufficient documentation

## 2023-06-12 DIAGNOSIS — Y9301 Activity, walking, marching and hiking: Secondary | ICD-10-CM | POA: Insufficient documentation

## 2023-06-12 DIAGNOSIS — I2510 Atherosclerotic heart disease of native coronary artery without angina pectoris: Secondary | ICD-10-CM | POA: Insufficient documentation

## 2023-06-12 MED ORDER — TETANUS-DIPHTH-ACELL PERTUSSIS 5-2.5-18.5 LF-MCG/0.5 IM SUSY
0.5000 mL | PREFILLED_SYRINGE | Freq: Once | INTRAMUSCULAR | Status: AC
  Administered 2023-06-12: 0.5 mL via INTRAMUSCULAR
  Filled 2023-06-12: qty 0.5

## 2023-06-12 MED ORDER — ACETAMINOPHEN 500 MG PO TABS
1000.0000 mg | ORAL_TABLET | Freq: Once | ORAL | Status: AC
  Administered 2023-06-12: 1000 mg via ORAL
  Filled 2023-06-12: qty 2

## 2023-06-12 NOTE — ED Triage Notes (Signed)
 Pt tripped hit his head (right side) on the door frame. No LOC, happened about 2 hours ago, some nausea.pt is onplavix for cva

## 2023-06-12 NOTE — Discharge Instructions (Signed)
 Your CT scan did not show any concerning findings.  Please resume your daily medications.  Take Tylenol  as needed for pain or headache.  Return to the ER for worsening symptoms.

## 2023-06-12 NOTE — ED Provider Notes (Signed)
 [place] EMERGENCY DEPARTMENT AT [facility] Provider Note   CSN: [id number] Arrival date & time: 06/12/23  1035     History  No chief complaint on file.   Joshua [name] is a 78 y.o. male.  78 year old male with past medical history of Parkinson's disease and coronary artery disease on Plavix  presenting to the emergency department today after hitting his head.  The patient states that he was walking and lost his footing and hit his head on the door frame at his house.  He did not lose consciousness.  He states he did have some nausea for a few minutes afterwards.  He denies any neck pain.  Denies any other injuries.  He reports he is having a mild headache since then.        Home Medications Prior to Admission medications   Medication Sig Start Date End Date Taking? Authorizing Provider  acetaminophen  (TYLENOL ) 325 MG tablet Take 1-2 tablets (325-650 mg total) by mouth every 4 (four) hours as needed for mild pain (pain score 1-3). 01/27/23   Angiulli, Everlyn Hockey, PA-C  albuterol  (VENTOLIN  HFA) 108 (90 Base) MCG/ACT inhaler Inhale 2 puffs into the lungs every 4 (four) hours as needed for wheezing or shortness of breath. 06/22/22   [provider]  amoxicillin (AMOXIL) 500 MG tablet Take 500 mg by mouth as needed. For dental visits. 04/13/23   [provider]  azelastine  (OPTIVAR ) 0.05 % ophthalmic solution Place 1 drop into both eyes 2 (two) times daily. 06/17/22   [provider]  Biotin 91478 MCG TABS Take 1,000 mcg by mouth in the morning.    [provider]  carbidopa -levodopa  (SINEMET  CR) 50-200 MG tablet TAKE 1 TABLET BY MOUTH EVERYDAY AT BEDTIME 03/01/23   Tat, Von Grumbling, DO  carbidopa -levodopa  (SINEMET  IR) 25-100 MG tablet 2 AT 7 AM, 2 AT 10 AM,2 AT 1PM,2 AT 4 PM, 2 AT 7 pm 03/01/23   Tat, Von Grumbling, DO  cholecalciferol  (VITAMIN D3) 25 MCG (1000 UNIT) tablet Take 1,000 Units by mouth daily.    [provider]  clopidogrel   (PLAVIX ) 75 MG tablet Take 1 tablet (75 mg total) by mouth daily. 03/01/23   Tat, Von Grumbling, DO  cyanocobalamin (VITAMIN B12) 1000 MCG tablet Take 1,000 mcg by mouth daily.    [provider]  docusate sodium  (COLACE) 100 MG capsule Take 2 capsules (200 mg total) by mouth 2 (two) times daily as needed for mild constipation. 01/27/23   Angiulli, Everlyn Hockey, PA-C  DYMISTA  137-50 MCG/ACT SUSP Place 1 puff into both nostrils at bedtime.  03/24/11   [provider]  escitalopram  (LEXAPRO ) 20 MG tablet Take 20 mg by mouth daily.    [provider]  finasteride  (PROSCAR ) 5 MG tablet Take 5 mg by mouth every evening. 04/17/11   [provider]  lansoprazole  (PREVACID ) 30 MG capsule Take 30 mg by mouth 2 (two) times daily. Morning & mid-afternoon 11/12/19   [provider]  levocetirizine (XYZAL ) 5 MG tablet Take 5 mg by mouth every evening.      [provider]  Menthol , Topical Analgesic, (BIOFREEZE EX) Apply 1 Application topically daily as needed (pain).    [provider]  metoprolol  succinate (TOPROL -XL) 25 MG 24 hr tablet TAKE 1/2 TABLET BY MOUTH DAILY Patient taking differently: Take 12.5 mg by mouth at bedtime. 06/24/21   Sanjuanita Cruz, NP  mexiletine (MEXITIL ) 150 MG capsule TAKE 1 CAPSULE BY MOUTH TWICE A DAY  08/12/22   Camnitz, Babetta Lesch, MD  midodrine  (PROAMATINE ) 5 MG tablet TAKE 1 TABLET BY MOUTH 2 TIMES DAILY AS NEEDED. TAKE IF SYSTOLIC BLOOD PRESSURE GETS BELOW 100 Patient taking differently: Take 5 mg by mouth See admin instructions. Take 5 mg daily, may take a second 5 mg dose as needed for low bp 09/07/22   Lei Pump, MD  montelukast  (SINGULAIR ) 10 MG tablet Take 10 mg by mouth at bedtime.      [provider]  nitroGLYCERIN  (NITROSTAT ) 0.4 MG SL tablet Place 1 tablet (0.4 mg total) under the tongue every 5 (five) minutes as needed for chest pain (Do not give more than 3 SL tablets in 15 minutes.). 02/07/21    Sanjuanita Cruz, NP  Omega-3 Fatty Acids  (FISH OIL) 1000 MG CAPS Take 1,000 mg by mouth in the morning.    [provider]  polyethylene glycol (MIRALAX ) 17 g packet Take 17 g by mouth daily. Patient taking differently: Take 17 g by mouth daily as needed for moderate constipation. 09/28/22   Albertus Alt, PA-C  PROCTOSOL HC 2.5 % rectal cream APPLY INTO AND AROUND RECTUM 2 TIMES A DAY Patient taking differently: Place 1 Application rectally 2 (two) times daily as needed for hemorrhoids or anal itching. 02/12/17   Tobin Forts, MD  rOPINIRole  (REQUIP ) 1 MG tablet TAKE 1 TABLET BY MOUTH THREE TIMES A DAY 05/31/23   Tat, Von Grumbling, DO  rosuvastatin  (CRESTOR ) 20 MG tablet Take 1 tablet (20 mg total) by mouth at bedtime. 02/07/21   Sanjuanita Cruz, NP  tamsulosin  (FLOMAX ) 0.4 MG CAPS capsule Take 0.4 mg by mouth daily after supper.  05/25/13   [provider]  traMADol  (ULTRAM ) 50 MG tablet Take 50 mg by mouth every 8 (eight) hours as needed for moderate pain (pain score 4-6). 10/13/22   [provider]  zolpidem  (AMBIEN ) 10 MG tablet Take 10 mg by mouth at bedtime. 05/13/11   [provider]      Allergies    Floxin i.v. in dextrose  5% [ofloxacin] and Terazosin    Review of Systems   Review of Systems  Neurological:  Positive for headaches.  All other systems reviewed and are negative.   Physical Exam Updated Vital Signs BP (!) 142/86 (BP Location: Right Arm)   Pulse 64   Temp 98 F (36.7 C)   Resp 18   Wt 100.7 kg   SpO2 95%   BMI 29.29 kg/m  Physical Exam Vitals and nursing note reviewed.   Gen: NAD Eyes: PERRL, EOMI HEENT: no oropharyngeal swelling Neck: trachea midline Resp: clear to auscultation bilaterally Card: RRR, no murmurs, rubs, or gallops Abd: nontender, nondistended Extremities: no calf tenderness, no edema Vascular: 2+ radial pulses bilaterally, 2+ DP pulses bilaterally Neuro: no focal deficits Skin: no rashes Psyc:  acting appropriately   ED Results / Procedures / Treatments   Labs (all labs ordered are listed, but only abnormal results are displayed) Labs Reviewed - No data to display  EKG None  Radiology CT Head Wo Contrast Result Date: 06/12/2023 CLINICAL DATA:  78 year old male status post fall striking right side of head on door frame. On Plavix . Status post left MCA lateral perirolandic infarct in December. EXAM: CT HEAD WITHOUT CONTRAST TECHNIQUE: Contiguous axial images were obtained from the base of the skull through the vertex without intravenous contrast. RADIATION DOSE REDUCTION: This exam was performed according to the departmental dose-optimization program which includes automated exposure control,  adjustment of the mA and/or kV according to patient size and/or use of iterative reconstruction technique. COMPARISON:  Head CT, brain MRI 01/11/2023 FINDINGS: Brain: Expected evolution of left perirolandic cortical and subcortical white matter encephalomalacia since December (coronal image 44). Stable background brain volume. No midline shift, ventriculomegaly, mass effect, evidence of mass lesion, intracranial hemorrhage or evidence of cortically based acute infarction. Patchy and confluent periventricular white matter hypodensity is stable. Vascular: Calcified atherosclerosis at the skull base. No suspicious intracranial vascular hyperdensity. Skull: Stable and intact.  Chronic left TMJ degeneration. Sinuses/Orbits: Visualized paranasal sinuses and mastoids are stable and well aerated. Other: No orbit or scalp soft tissue injury is identified. IMPRESSION: 1. No acute intracranial abnormality or acute traumatic injury identified. 2. Expected evolution of Left MCA infarct since December. Chronic white matter disease. Electronically Signed   By: Marlise Simpers M.D.   On: 06/12/2023 11:31    Procedures Procedures    Medications Ordered in ED Medications  acetaminophen  (TYLENOL ) tablet 1,000 mg (1,000 mg  Oral Given 06/12/23 1120)  Tdap (BOOSTRIX) injection 0.5 mL (0.5 mLs Intramuscular Given 06/12/23 1122)    ED Course/ Medical Decision Making/ A&P                                 Medical Decision Making 78 year old male with past medical history of Parkinson's disease and coronary artery disease on Plavix  presenting to the emergency department today with a closed head injury.  He does have a mild abrasion.  Will update his tetanus.  Will give him Tylenol  for pain.  Will obtain a CT scan of his head for further evaluation for intracranial hemorrhage.  If this is negative I think that he may be safely discharged.  The cervical spine is cleared using Nexus criteria.  The patient CT scan is negative.  He is discharged with return precautions.  Amount and/or Complexity of Data Reviewed Radiology: ordered.  Risk OTC drugs. Prescription drug management.           Final Clinical Impression(s) / ED Diagnoses Final diagnoses:  Closed head injury, initial encounter    Rx / DC Orders ED Discharge Orders     None         Carin Charleston, MD 06/12/23 1253

## 2023-06-17 ENCOUNTER — Ambulatory Visit: Admitting: Cardiology

## 2023-06-20 ENCOUNTER — Emergency Department (HOSPITAL_BASED_OUTPATIENT_CLINIC_OR_DEPARTMENT_OTHER)

## 2023-06-20 ENCOUNTER — Emergency Department (HOSPITAL_BASED_OUTPATIENT_CLINIC_OR_DEPARTMENT_OTHER)
Admission: EM | Admit: 2023-06-20 | Discharge: 2023-06-20 | Disposition: A | Discharge status: Home / Self Care | Attending: Emergency Medicine | Admitting: Emergency Medicine

## 2023-06-20 DIAGNOSIS — W19XXXA Unspecified fall, initial encounter: Secondary | ICD-10-CM

## 2023-06-20 DIAGNOSIS — S0003XA Contusion of scalp, initial encounter: Secondary | ICD-10-CM | POA: Diagnosis not present

## 2023-06-20 DIAGNOSIS — S80212A Abrasion, left knee, initial encounter: Secondary | ICD-10-CM

## 2023-06-20 DIAGNOSIS — W01198A Fall on same level from slipping, tripping and stumbling with subsequent striking against other object, initial encounter: Secondary | ICD-10-CM | POA: Insufficient documentation

## 2023-06-20 DIAGNOSIS — S0990XA Unspecified injury of head, initial encounter: Secondary | ICD-10-CM | POA: Diagnosis present

## 2023-06-20 DIAGNOSIS — M25512 Pain in left shoulder: Secondary | ICD-10-CM | POA: Diagnosis not present

## 2023-06-20 NOTE — ED Triage Notes (Signed)
 Tripped over cane. Struck left side head, left shoulder, left knee on concrete. No LOC. On plavix . Ambulatory to triage.

## 2023-06-20 NOTE — Discharge Instructions (Signed)
 Recommend Tylenol  ice and rest.  Follow-up with your primary care doctor if needed for pain control.

## 2023-06-20 NOTE — ED Provider Notes (Signed)
 [place] EMERGENCY DEPARTMENT AT [facility] Provider Note   CSN: [id number] Arrival date & time: 06/20/23  1253     History  Chief Complaint  Patient presents with   Joshua [name] is a 78 y.o. male.  Patient here after mechanical fall.  Marvell Slider hit the left side of his head and left shoulder and left knee.  He is on Plavix .  Ambulatory since the fall.  Did not lose consciousness.  Not having any neck pain.  No pain elsewhere otherwise.  Denies any weakness numbness tingling.  Denies any chest pain shortness of breath.  The history is provided by the patient.       Home Medications Prior to Admission medications   Medication Sig Start Date End Date Taking? Authorizing Provider  acetaminophen  (TYLENOL ) 325 MG tablet Take 1-2 tablets (325-650 mg total) by mouth every 4 (four) hours as needed for mild pain (pain score 1-3). 01/27/23   Angiulli, Everlyn Hockey, PA-C  albuterol  (VENTOLIN  HFA) 108 (90 Base) MCG/ACT inhaler Inhale 2 puffs into the lungs every 4 (four) hours as needed for wheezing or shortness of breath. 06/22/22   [provider]  amoxicillin (AMOXIL) 500 MG tablet Take 500 mg by mouth as needed. For dental visits. 04/13/23   [provider]  azelastine  (OPTIVAR ) 0.05 % ophthalmic solution Place 1 drop into both eyes 2 (two) times daily. 06/17/22   [provider]  Biotin 40981 MCG TABS Take 1,000 mcg by mouth in the morning.    [provider]  carbidopa -levodopa  (SINEMET  CR) 50-200 MG tablet TAKE 1 TABLET BY MOUTH EVERYDAY AT BEDTIME 03/01/23   Tat, Rebecca S, DO  carbidopa -levodopa  (SINEMET  IR) 25-100 MG tablet 2 AT 7 AM, 2 AT 10 AM,2 AT 1PM,2 AT 4 PM, 2 AT 7 pm 03/01/23   Tat, Von Grumbling, DO  cholecalciferol  (VITAMIN D3) 25 MCG (1000 UNIT) tablet Take 1,000 Units by mouth daily.    [provider]  clopidogrel  (PLAVIX ) 75 MG tablet Take 1 tablet (75 mg total) by mouth daily. 03/01/23   Tat, Von Grumbling, DO   cyanocobalamin (VITAMIN B12) 1000 MCG tablet Take 1,000 mcg by mouth daily.    [provider]  docusate sodium  (COLACE) 100 MG capsule Take 2 capsules (200 mg total) by mouth 2 (two) times daily as needed for mild constipation. 01/27/23   Angiulli, Everlyn Hockey, PA-C  DYMISTA  137-50 MCG/ACT SUSP Place 1 puff into both nostrils at bedtime.  03/24/11   [provider]  escitalopram  (LEXAPRO ) 20 MG tablet Take 20 mg by mouth daily.    [provider]  finasteride  (PROSCAR ) 5 MG tablet Take 5 mg by mouth every evening. 04/17/11   [provider]  lansoprazole  (PREVACID ) 30 MG capsule Take 30 mg by mouth 2 (two) times daily. Morning & mid-afternoon 11/12/19   [provider]  levocetirizine (XYZAL ) 5 MG tablet Take 5 mg by mouth every evening.      [provider]  Menthol , Topical Analgesic, (BIOFREEZE EX) Apply 1 Application topically daily as needed (pain).    [provider]  metoprolol  succinate (TOPROL -XL) 25 MG 24 hr tablet TAKE 1/2 TABLET BY MOUTH DAILY Patient taking differently: Take 12.5 mg by mouth at bedtime. 06/24/21   Sanjuanita Cruz, NP  mexiletine (MEXITIL ) 150 MG capsule TAKE 1 CAPSULE BY MOUTH TWICE A DAY 08/12/22   Camnitz, Babetta Lesch, MD  midodrine  (PROAMATINE ) 5 MG tablet TAKE 1 TABLET BY  MOUTH 2 TIMES DAILY AS NEEDED. TAKE IF SYSTOLIC BLOOD PRESSURE GETS BELOW 100 Patient taking differently: Take 5 mg by mouth See admin instructions. Take 5 mg daily, may take a second 5 mg dose as needed for low bp 09/07/22   Lei Pump, MD  montelukast  (SINGULAIR ) 10 MG tablet Take 10 mg by mouth at bedtime.      [provider]  nitroGLYCERIN  (NITROSTAT ) 0.4 MG SL tablet Place 1 tablet (0.4 mg total) under the tongue every 5 (five) minutes as needed for chest pain (Do not give more than 3 SL tablets in 15 minutes.). 02/07/21   Sanjuanita Cruz, NP  Omega-3 Fatty Acids  (FISH OIL) 1000 MG CAPS Take 1,000 mg by mouth in the  morning.    [provider]  polyethylene glycol (MIRALAX ) 17 g packet Take 17 g by mouth daily. Patient taking differently: Take 17 g by mouth daily as needed for moderate constipation. 09/28/22   Albertus Alt, PA-C  PROCTOSOL HC 2.5 % rectal cream APPLY INTO AND AROUND RECTUM 2 TIMES A DAY Patient taking differently: Place 1 Application rectally 2 (two) times daily as needed for hemorrhoids or anal itching. 02/12/17   Tobin Forts, MD  rOPINIRole  (REQUIP ) 1 MG tablet TAKE 1 TABLET BY MOUTH THREE TIMES A DAY 05/31/23   Tat, Von Grumbling, DO  rosuvastatin  (CRESTOR ) 20 MG tablet Take 1 tablet (20 mg total) by mouth at bedtime. 02/07/21   Sanjuanita Cruz, NP  tamsulosin  (FLOMAX ) 0.4 MG CAPS capsule Take 0.4 mg by mouth daily after supper.  05/25/13   [provider]  traMADol  (ULTRAM ) 50 MG tablet Take 50 mg by mouth every 8 (eight) hours as needed for moderate pain (pain score 4-6). 10/13/22   [provider]  zolpidem  (AMBIEN ) 10 MG tablet Take 10 mg by mouth at bedtime. 05/13/11   [provider]      Allergies    Floxin i.v. in dextrose  5% [ofloxacin] and Terazosin    Review of Systems   Review of Systems  Physical Exam Updated Vital Signs BP (!) 148/82   Pulse (!) 58   Temp 97.6 F (36.4 C) (Oral)   Resp 18   SpO2 100%  Physical Exam Vitals and nursing note reviewed.  Constitutional:      General: He is not in acute distress.    Appearance: He is well-developed.  HENT:     Head:     Comments: Hematoma over the left side of his head    Nose: Nose normal.     Mouth/Throat:     Mouth: Mucous membranes are moist.  Eyes:     Extraocular Movements: Extraocular movements intact.     Conjunctiva/sclera: Conjunctivae normal.     Pupils: Pupils are equal, round, and reactive to light.  Cardiovascular:     Rate and Rhythm: Normal rate and regular rhythm.     Heart sounds: No murmur heard. Pulmonary:     Effort: Pulmonary effort is normal. No  respiratory distress.     Breath sounds: Normal breath sounds.  Abdominal:     Palpations: Abdomen is soft.     Tenderness: There is no abdominal tenderness.  Musculoskeletal:        General: Tenderness present. No swelling.     Cervical back: Neck supple.     Comments: Tenderness to the left shoulder  Skin:    General: Skin is warm and dry.     Capillary Refill: Capillary  refill takes less than 2 seconds.     Comments: Abrasion to the left knee  Neurological:     General: No focal deficit present.     Mental Status: He is alert and oriented to person, place, and time.     Cranial Nerves: No cranial nerve deficit.     Sensory: No sensory deficit.     Motor: No weakness.     Coordination: Coordination normal.     Comments: 5+ out of 5 strength throughout, normal sensation, no drift, normal finger-to-nose finger, normal speech  Psychiatric:        Mood and Affect: Mood normal.     ED Results / Procedures / Treatments   Labs (all labs ordered are listed, but only abnormal results are displayed) Labs Reviewed - No data to display  EKG None  Radiology DG Knee Complete 4 Views Left Result Date: 06/20/2023 CLINICAL DATA:  pain EXAM: LEFT KNEE - COMPLETE 4+ VIEW COMPARISON:  X-ray left knee 09/28/2022 FINDINGS: Total left knee arthroplasty. No radiographic findings to suggest surgical hardware complication. No evidence of fracture, dislocation, or joint effusion. No evidence of arthropathy or other focal bone abnormality. Vascular calcification. Soft tissues are unremarkable. IMPRESSION: Total left knee arthroplasty. Negative for acute traumatic injury. Electronically Signed   By: Morgane  Naveau M.D.   On: 06/20/2023 14:04   CT Head Wo Contrast Result Date: 06/20/2023 CLINICAL DATA:  Trip and fall, pain EXAM: CT HEAD WITHOUT CONTRAST CT CERVICAL SPINE WITHOUT CONTRAST TECHNIQUE: Multidetector CT imaging of the head and cervical spine was performed following the standard protocol without  intravenous contrast. Multiplanar CT image reconstructions of the cervical spine were also generated. RADIATION DOSE REDUCTION: This exam was performed according to the departmental dose-optimization program which includes automated exposure control, adjustment of the mA and/or kV according to patient size and/or use of iterative reconstruction technique. COMPARISON:  06/12/2023 FINDINGS: CT HEAD FINDINGS Brain: No evidence of acute infarction, hemorrhage, hydrocephalus, extra-axial collection or mass lesion/mass effect. Periventricular and deep white matter hypodensity. Unchanged encephalomalacia of the posterior left frontal lobe (series 2, image 26). Vascular: No hyperdense vessel or unexpected calcification. Skull: Normal. Negative for fracture or focal lesion. Sinuses/Orbits: No acute finding. Other: None. CT CERVICAL SPINE FINDINGS Alignment: Normal. Skull base and vertebrae: No acute fracture. No primary bone lesion or focal pathologic process. Soft tissues and spinal canal: No prevertebral fluid or swelling. No visible canal hematoma. Disc levels: Anterior cervical discectomy and fusion of C5-C6 with moderate disc degenerative disease throughout the remainder of the cervical spine. Upper chest: Negative. Other: None. IMPRESSION: 1. No acute intracranial pathology. Small-vessel white matter disease and unchanged encephalomalacia of the posterior left frontal lobe. 2. No fracture or static subluxation of the cervical spine. 3. Anterior cervical discectomy and fusion of C5-C6 with moderate disc degenerative disease throughout the remainder of the cervical spine. Electronically Signed   By: Fredricka Jenny M.D.   On: 06/20/2023 14:01   CT Cervical Spine Wo Contrast Result Date: 06/20/2023 CLINICAL DATA:  Trip and fall, pain EXAM: CT HEAD WITHOUT CONTRAST CT CERVICAL SPINE WITHOUT CONTRAST TECHNIQUE: Multidetector CT imaging of the head and cervical spine was performed following the standard protocol without  intravenous contrast. Multiplanar CT image reconstructions of the cervical spine were also generated. RADIATION DOSE REDUCTION: This exam was performed according to the departmental dose-optimization program which includes automated exposure control, adjustment of the mA and/or kV according to patient size and/or use of iterative reconstruction technique. COMPARISON:  06/12/2023 FINDINGS: CT HEAD FINDINGS Brain: No evidence of acute infarction, hemorrhage, hydrocephalus, extra-axial collection or mass lesion/mass effect. Periventricular and deep white matter hypodensity. Unchanged encephalomalacia of the posterior left frontal lobe (series 2, image 26). Vascular: No hyperdense vessel or unexpected calcification. Skull: Normal. Negative for fracture or focal lesion. Sinuses/Orbits: No acute finding. Other: None. CT CERVICAL SPINE FINDINGS Alignment: Normal. Skull base and vertebrae: No acute fracture. No primary bone lesion or focal pathologic process. Soft tissues and spinal canal: No prevertebral fluid or swelling. No visible canal hematoma. Disc levels: Anterior cervical discectomy and fusion of C5-C6 with moderate disc degenerative disease throughout the remainder of the cervical spine. Upper chest: Negative. Other: None. IMPRESSION: 1. No acute intracranial pathology. Small-vessel white matter disease and unchanged encephalomalacia of the posterior left frontal lobe. 2. No fracture or static subluxation of the cervical spine. 3. Anterior cervical discectomy and fusion of C5-C6 with moderate disc degenerative disease throughout the remainder of the cervical spine. Electronically Signed   By: Fredricka Jenny M.D.   On: 06/20/2023 14:01   DG Shoulder Left Result Date: 06/20/2023 CLINICAL DATA:  Pain. Tripped over cane. Struck left side head, left shoulder, left knee on concrete today EXAM: LEFT SHOULDER - 2+ VIEW COMPARISON:  None Available. FINDINGS: There is no evidence of fracture or dislocation. Moderate  degenerative changes of the shoulder. Soft tissues are unremarkable. Atherosclerotic plaque. IMPRESSION: No acute displaced fracture or dislocation. Electronically Signed   By: Morgane  Naveau M.D.   On: 06/20/2023 14:00    Procedures Procedures    Medications Ordered in ED Medications - No data to display  ED Course/ Medical Decision Making/ A&P                                 Medical Decision Making Amount and/or Complexity of Data Reviewed Radiology: ordered.   Joshua [name] is here after mechanical fall.  On Plavix .  Pain to his left side of his head left shoulder.  History of CAD.  Overall he tripped and fell while walking.  Has a small hematoma over the left side of his head.  Abrasion to his left knee.  Tenderness to the left shoulder.  CT scan of the head and neck were performed which were unremarkable per radiology report.  X-ray of the left shoulder left knee unremarkable.  He is no pain elsewhere.  No midline spinal pain elsewhere.  He is very well-appearing.  Overall contusion.  No other concern for emergent process.  Patient discharged in good condition.  Recommend ice Tylenol  and rest.  This chart was dictated using voice recognition software.  Despite best efforts to proofread,  errors can occur which can change the documentation meaning.         Final Clinical Impression(s) / ED Diagnoses Final diagnoses:  Fall, initial encounter  Contusion of scalp, initial encounter  Abrasion of left knee, initial encounter  Acute pain of left shoulder    Rx / DC Orders ED Discharge Orders     None         Lowery Rue, DO 06/20/23 1415

## 2023-07-21 NOTE — Progress Notes (Unsigned)
 Assessment/Plan:   1.  Parkinsons Disease, possibly ET/PD  -Continue carbidopa /levodopa  25/100,2 tablets at 7 AM/2 tablets at 10 AM/2 at 1pm/2 at 4pm/2 at 7pm  -Continue carbidopa /levodopa  50/200 at bedtime  -Continue requip  1 mg tid.  Patient is markedly improved in terms of decreased rigidity on this medication.  -Patient had skin biopsy done for alpha-synuclein in January, 2024 which did demonstrate evidence of alpha-synuclein in all 3 sites.    -discussed trekking poles  -discussed larger/weighted utensils and he was shown those today    2.  History of watershed infarct.  Years later, the patient had another infarct in the left precentral/middle frontal gyrus in December, 2025.  -First infarct was watershed infarct and had a [place] recorder, but had it explanted after it never demonstrated any issues  -Second infarct in December, 2025 occurred during knee replacement surgery and patient was off of the Plavix  for the surgery.  -Patient on Brilinta  now.  He finishes that middle of next week and then plan from the hospital was to go back to aspirin  monotherapy.  That worries him, as he had a stroke in the past on aspirin  and he would like to go back to his Plavix  monotherapy.  I think that is reasonable.  I gave him a prescription to refill that, but told him not to take that until he was done with the Brilinta .    -Patient's LDL was only 43  -RX OT  3.  History of multiple syncopal episodes, likely due to [facility]  -Off of propranolol  and no further syncope.  cardiology has added metoprolol  ever since his NSTEMI.    -continue midodrine  5 mg bid.  He is generally only taking it once per day and feels that he is doing okay.    4.  Low back pain  -Has had surgical interventions in the past.  Has followed with Dr. Letha and Dr. Onetha  5.  NSTEMI, 01/2020  -He was started on metoprolol  at that time, although his Imdur  was stopped because of hypotension.  6.  Constipation  -Patient is  following with gastroenterology, Dr. Abran.  He also has gastroenterology at [facility] because of his history of Barrett's esophagus, now status post endoscopic ablation.  7.  Carpal tunnel syndrome  - Status post release with Dr. Shari, April, 2025  Subjective:   Joshua [name] was seen today in follow up.  Pt with daughter who supplements hx.  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  This did not explain the issues with extending his wrist and fingers, and I told him we should go ahead and we discussed referral to hand specialist and MRI of the cervical spine.  Patient did want a referral to the hand specialist, and a referral was made.  He did see Dr. Shari.  He ultimately had carpal tunnel release.  This was done April, 2025.  Separately, the patient was in the emergency room the 31st after falling and hitting his head on the door frame at his house by tripping on his exercise bike.  Emergency room workup was negative.  He was back in the emergency room on June 8 after another fall.  He put his walking stick in a hole outside and fell.  He did have CT head that was unremarkable.  He also had a CT neck that was unremarkable, demonstrating his evidence of  his prior fusion.  No hallucinations.  No syncope.    Current prescribed movement disorder medications: Carbidopa /levodopa  25/100,2 tablets at 7 AM/2 tablets at 10 AM/2 at 1pm/2 at 4pm/2 at 7pm  Carbidopa /levodopa  50/200 at bedtime Requip  1 mg tid Midodrine  5 mg bid (he has only been taking it once per day but states that it doesn't run low at home)   PREVIOUS MEDICATIONS: Pramipexole  (just was not helpful for tremor); primidone ; propranolol ; metoprolol   ALLERGIES:   Allergies  Allergen Reactions   Floxin I.V. In Dextrose  5% [Ofloxacin] Other (See Comments)    Lowers BP    Terazosin Other (See Comments)    Lower  bp    CURRENT MEDICATIONS:  Outpatient Encounter Medications as of 07/22/2023  Medication Sig   acetaminophen  (TYLENOL ) 325 MG tablet Take 1-2 tablets (325-650 mg total) by mouth every 4 (four) hours as needed for mild pain (pain score 1-3).   albuterol  (VENTOLIN  HFA) 108 (90 Base) MCG/ACT inhaler Inhale 2 puffs into the lungs every 4 (four) hours as needed for wheezing or shortness of breath.   amoxicillin (AMOXIL) 500 MG tablet Take 500 mg by mouth as needed. For dental visits.   azelastine  (OPTIVAR ) 0.05 % ophthalmic solution Place 1 drop into both eyes 2 (two) times daily.   Biotin 89999 MCG TABS Take 1,000 mcg by mouth in the morning.   carbidopa -levodopa  (SINEMET  CR) 50-200 MG tablet TAKE 1 TABLET BY MOUTH EVERYDAY AT BEDTIME   carbidopa -levodopa  (SINEMET  IR) 25-100 MG tablet 2 AT 7 AM, 2 AT 10 AM,2 AT 1PM,2 AT 4 PM, 2 AT 7 pm   cholecalciferol  (VITAMIN D3) 25 MCG (1000 UNIT) tablet Take 1,000 Units by mouth daily.   clopidogrel  (PLAVIX ) 75 MG tablet Take 1 tablet (75 mg total) by mouth daily.   cyanocobalamin (VITAMIN B12) 1000 MCG tablet Take 1,000 mcg by mouth daily.   docusate sodium  (COLACE) 100 MG capsule Take 2 capsules (200 mg total) by mouth 2 (two) times daily as needed for mild constipation.   DYMISTA  137-50 MCG/ACT SUSP Place 1 puff into both nostrils at bedtime.    escitalopram  (LEXAPRO ) 20 MG tablet Take 20 mg by mouth daily.   finasteride  (PROSCAR ) 5 MG tablet Take 5 mg by mouth every evening.   lansoprazole  (PREVACID ) 30 MG capsule Take 30 mg by mouth 2 (two) times daily. Morning & mid-afternoon   levocetirizine (XYZAL ) 5 MG tablet Take 5 mg by mouth every evening.     Menthol , Topical Analgesic, (BIOFREEZE EX) Apply 1 Application topically daily as needed (pain).   metoprolol  succinate (TOPROL -XL) 25 MG 24 hr tablet TAKE 1/2 TABLET BY MOUTH DAILY (Patient taking differently: Take 12.5 mg by mouth at bedtime.)   mexiletine (MEXITIL ) 150 MG capsule TAKE 1 CAPSULE BY MOUTH  TWICE A DAY   midodrine  (PROAMATINE ) 5 MG tablet TAKE 1 TABLET BY MOUTH 2 TIMES DAILY AS NEEDED. TAKE IF SYSTOLIC BLOOD PRESSURE GETS BELOW 100 (Patient taking differently: Take 5 mg by mouth See admin instructions. Take 5 mg daily, may take a second 5 mg dose as needed for low bp)   montelukast  (SINGULAIR ) 10 MG tablet Take 10 mg by mouth at bedtime.     nitroGLYCERIN  (NITROSTAT ) 0.4 MG SL tablet Place 1 tablet (0.4 mg total) under the tongue every 5 (five) minutes as needed for chest pain (Do not give more than 3 SL tablets in 15 minutes.).   Omega-3 Fatty Acids  (FISH OIL) 1000 MG CAPS Take 1,000 mg by mouth  in the morning.   polyethylene glycol (MIRALAX ) 17 g packet Take 17 g by mouth daily. (Patient taking differently: Take 17 g by mouth daily as needed for moderate constipation.)   PROCTOSOL HC 2.5 % rectal cream APPLY INTO AND AROUND RECTUM 2 TIMES A DAY (Patient taking differently: Place 1 Application rectally 2 (two) times daily as needed for hemorrhoids or anal itching.)   rOPINIRole  (REQUIP ) 1 MG tablet TAKE 1 TABLET BY MOUTH THREE TIMES A DAY   rosuvastatin  (CRESTOR ) 20 MG tablet Take 1 tablet (20 mg total) by mouth at bedtime.   tamsulosin  (FLOMAX ) 0.4 MG CAPS capsule Take 0.4 mg by mouth daily after supper.    traMADol  (ULTRAM ) 50 MG tablet Take 50 mg by mouth every 8 (eight) hours as needed for moderate pain (pain score 4-6).   zolpidem  (AMBIEN ) 10 MG tablet Take 10 mg by mouth at bedtime.   No facility-administered encounter medications on file as of 07/22/2023.    Objective:   PHYSICAL EXAMINATION:    VITALS:   Vitals:   07/22/23 1451  BP: 126/70  Pulse: 76  SpO2: 96%  Weight: 229 lb 12.8 oz (104.2 kg)    GEN:  The patient appears stated age and is in NAD. HEENT:  Normocephalic, atraumatic.  The mucous membranes are moist. The superficial temporal arteries are without ropiness or tenderness. CV:  RRR Lungs:  CTAB Neck/HEME:  There are no carotid bruits  bilaterally.  Neurological examination:  Orientation: The patient is alert and oriented x3. Cranial nerves: There is good facial symmetry with facial hypomimia. The speech is fluent and clear.  .  Soft palate rises symmetrically and there is no tongue deviation. Hearing is intact to conversational tone. Sensation: Sensation is intact to light touch throughout Motor: Strength is 5/5 in the biceps/deltoid/brachio on the right.  He has difficulty extending the wrist.  He has difficulty extending the fingers on the right.  He does have good grip strength on the right.  He has good ability to flex the wrist.  Strength in the left upper extremity is 5/5 throughout, including in the wrist, fingers, hands.  Strength in the bilateral lower extremities is 5/5.  This is all stable from prior.  Movement examination: Tone: There is nl tone in the UE/LE  Abnormal movements: Occasional mild right upper extremity rest tremor. Coordination:  There is no decremation, with any form of RAMS, including alternating supination and pronation of the forearm, hand opening and closing, finger taps, heel taps and toe taps bilaterally  Gait and Station: The patient pushes off to arise.  The patient is forward flexed with decreased arm swing and he slightly drags the R leg.  He is a bit short stepped.  He is slightly antalgic.  This is stable from prior.  I have reviewed and interpreted the following labs independently    Chemistry      Component Value Date/Time   NA 136 01/25/2023 0455   NA 136 02/14/2021 1528   K 4.2 01/25/2023 0455   CL 105 01/25/2023 0455   CO2 23 01/25/2023 0455   BUN 16 01/25/2023 0455   BUN 20 02/14/2021 1528   CREATININE 0.93 01/25/2023 0455   CREATININE 0.88 09/18/2015 1135      Component Value Date/Time   CALCIUM  9.0 01/25/2023 0455   ALKPHOS 51 01/16/2023 0612   AST 16 01/16/2023 0612   ALT <5 01/16/2023 0612   BILITOT 1.7 (H) 01/16/2023 0612   BILITOT 0.7 04/04/2015 1501  Lab Results  Component Value Date   WBC 8.0 01/25/2023   HGB 11.6 (L) 01/25/2023   HCT 35.2 (L) 01/25/2023   MCV 92.4 01/25/2023   PLT 352 01/25/2023    Lab Results  Component Value Date   TSH 0.893 05/26/2022     Total time spent on today's visit was 30 minutes, including both face-to-face time and nonface-to-face time.  Time included that spent on review of records (prior notes available to me/labs/imaging if pertinent), discussing treatment and goals, answering patient's questions and coordinating care.  Cc:  Loreli Elsie JONETTA Mickey., MD

## 2023-07-22 ENCOUNTER — Encounter: Payer: Self-pay | Admitting: Neurology

## 2023-07-22 ENCOUNTER — Ambulatory Visit (INDEPENDENT_AMBULATORY_CARE_PROVIDER_SITE_OTHER): Payer: Medicare HMO | Admitting: Neurology

## 2023-07-22 VITALS — BP 126/70 | HR 76 | Wt 229.8 lb

## 2023-07-22 DIAGNOSIS — G20A1 Parkinson's disease without dyskinesia, without mention of fluctuations: Secondary | ICD-10-CM | POA: Diagnosis not present

## 2023-07-22 NOTE — Patient Instructions (Signed)
 SAVE THE DATE!  We are planning a Parkinsons Disease educational symposium at [facility], [address] [place], [place], [place] [address] on September 19.  We will have a movement disorder physician expert from [employer] coming to speak and a caregiver speaker.  We will have a panel of experts that will show you who you may need on your team of people on your journey with Parkinsons.  I hope to see you there!  Use this QR code to register by scanning it with the camera app on your phone:      Need more help with registration?  Contact [email or web address]

## 2023-08-16 ENCOUNTER — Ambulatory Visit (INDEPENDENT_AMBULATORY_CARE_PROVIDER_SITE_OTHER): Admitting: Cardiology

## 2023-08-16 ENCOUNTER — Encounter (HOSPITAL_BASED_OUTPATIENT_CLINIC_OR_DEPARTMENT_OTHER): Payer: Self-pay | Admitting: Cardiology

## 2023-08-16 VITALS — BP 102/58 | HR 78 | Ht 73.0 in | Wt 227.0 lb

## 2023-08-16 DIAGNOSIS — I4510 Unspecified right bundle-branch block: Secondary | ICD-10-CM

## 2023-08-16 DIAGNOSIS — I2510 Atherosclerotic heart disease of native coronary artery without angina pectoris: Secondary | ICD-10-CM | POA: Diagnosis not present

## 2023-08-16 NOTE — Patient Instructions (Signed)

## 2023-08-16 NOTE — Progress Notes (Signed)
 Cardiology Office Note:  .   Date:  08/16/2023  ID:  Joshua [name], DOB [birth year], MRN [id number] PCP: Loreli Elsie JONETTA Mickey., MD  [facility] Providers Cardiologist:  Oneil Parchment, MD Electrophysiologist:  Will Gladis Norton, MD     History of Present Illness: .   Joshua [name] is a 78 y.o. male Discussed the use of AI scribe software for clinical note transcription with the patient, who gave verbal consent to proceed.  History of Present Illness Joshua [name] is a 78 year old male with coronary artery disease and Parkinson's disease who presents for cardiovascular follow-up.  Coronary artery disease and myocardial infarction - Non-ST elevation myocardial infarction (non-STEMI) in 2022 - Currently on metoprolol  12.5 mg daily and mexiletine 150 mg twice a day - No chest pain, shortness of breath, or palpitations reported  Orthostatic hypotension and syncope - History of hypotension. - On midodrine  5 mg, usually once daily, for blood pressure support - No recent episodes of syncope since discontinuing propranolol  - Experienced syncope during a virtual appointment in the past - Cautious about driving, allows wife to drive if feeling dizzy  Parkinsonism and gait instability - Parkinson's disease with mechanical falls - Falls have resulted in a left-sided head hematoma - Incidents of tripping and falling at home and at public events - Struck head on ductwork at [employer] - Uses a cane for mobility - No syncope associated with recent falls - Experiences a 'jumpy feeling' affecting coordination - Working on hand function, able to hold a coffee cup and eat with it - Remains active with family support during outings and vacations  Hyperlipidemia - On Crestor  20 mg daily     Studies Reviewed: .        Results LABS LDL: 43 mg/dL Hemoglobin: 88.3 g/dL Creatinine: 0.9 mg/dL  DIAGNOSTIC ECG: Normal Risk Assessment/Calculations:             Physical Exam:   VS:  BP (!) 102/58 (BP Location: Left Arm, Patient Position: Sitting, Cuff Size: Large)   Pulse 78   Ht 6' 1 (1.854 m)   Wt 227 lb (103 kg)   SpO2 96%   BMI 29.95 kg/m    Wt Readings from Last 3 Encounters:  08/16/23 227 lb (103 kg)  07/22/23 229 lb 12.8 oz (104.2 kg)  06/12/23 222 lb (100.7 kg)    GEN: Well nourished, well developed in no acute distress NECK: No JVD; No carotid bruits CARDIAC: RRR, no murmurs, no rubs, no gallops RESPIRATORY:  Clear to auscultation without rales, wheezing or rhonchi  ABDOMEN: Soft, non-tender, non-distended EXTREMITIES:  No edema; No deformity   ASSESSMENT AND PLAN: .    Assessment and Plan Assessment & Plan Coronary artery disease with history of non-ST elevation myocardial infarction Coronary artery disease with non-ST elevation myocardial infarction in 2022. Lesions in multiple vessels with 70% and 60% stenosis. No high-risk ischemia. Blood pressure is low at 102/58 mmHg. No recent syncope episodes. - Continue current management. - Monitor blood pressure to avoid hypotensive episodes.  Premature ventricular contractions on mexiletine and metoprolol  High burden of PVCs at 26% despite mexiletine 150 mg twice daily and metoprolol  12.5 mg daily. EKG shows no acute changes. - Continue mexiletine 150 mg twice daily. - Continue metoprolol  12.5 mg daily.  Orthostatic hypotension managed with midodrine  Orthostatic hypotension managed with midodrine  5 mg as needed, usually once a day. No recent syncope episodes. - Continue midodrine  5 mg as needed, usually once  a day. - Advise monitoring for dizziness or syncope and adjust midodrine  use accordingly.  Hyperlipidemia on statin therapy Hyperlipidemia well-controlled on rosuvastatin  20 mg daily. LDL cholesterol at 43 mg/dL, within target range. - Continue rosuvastatin  20 mg daily.        1 yr  Signed, Oneil Parchment, MD

## 2023-08-28 ENCOUNTER — Other Ambulatory Visit: Payer: Self-pay | Admitting: Neurology

## 2023-09-08 ENCOUNTER — Other Ambulatory Visit: Payer: Self-pay | Admitting: Cardiology

## 2023-09-10 ENCOUNTER — Emergency Department (HOSPITAL_BASED_OUTPATIENT_CLINIC_OR_DEPARTMENT_OTHER)

## 2023-09-10 ENCOUNTER — Emergency Department (HOSPITAL_BASED_OUTPATIENT_CLINIC_OR_DEPARTMENT_OTHER)
Admission: EM | Admit: 2023-09-10 | Discharge: 2023-09-10 | Disposition: A | Discharge status: Home / Self Care | Attending: Emergency Medicine | Admitting: Emergency Medicine

## 2023-09-10 ENCOUNTER — Emergency Department (HOSPITAL_BASED_OUTPATIENT_CLINIC_OR_DEPARTMENT_OTHER): Admitting: Radiology

## 2023-09-10 ENCOUNTER — Encounter (HOSPITAL_BASED_OUTPATIENT_CLINIC_OR_DEPARTMENT_OTHER): Payer: Self-pay

## 2023-09-10 DIAGNOSIS — W01198A Fall on same level from slipping, tripping and stumbling with subsequent striking against other object, initial encounter: Secondary | ICD-10-CM | POA: Insufficient documentation

## 2023-09-10 DIAGNOSIS — I4510 Unspecified right bundle-branch block: Secondary | ICD-10-CM | POA: Insufficient documentation

## 2023-09-10 DIAGNOSIS — R42 Dizziness and giddiness: Secondary | ICD-10-CM | POA: Insufficient documentation

## 2023-09-10 DIAGNOSIS — S80211A Abrasion, right knee, initial encounter: Secondary | ICD-10-CM | POA: Insufficient documentation

## 2023-09-10 DIAGNOSIS — S8991XA Unspecified injury of right lower leg, initial encounter: Secondary | ICD-10-CM | POA: Diagnosis present

## 2023-09-10 DIAGNOSIS — Z7902 Long term (current) use of antithrombotics/antiplatelets: Secondary | ICD-10-CM | POA: Diagnosis not present

## 2023-09-10 DIAGNOSIS — S0081XA Abrasion of other part of head, initial encounter: Secondary | ICD-10-CM | POA: Insufficient documentation

## 2023-09-10 DIAGNOSIS — Z7901 Long term (current) use of anticoagulants: Secondary | ICD-10-CM | POA: Insufficient documentation

## 2023-09-10 DIAGNOSIS — S0990XA Unspecified injury of head, initial encounter: Secondary | ICD-10-CM | POA: Diagnosis present

## 2023-09-10 DIAGNOSIS — S50311A Abrasion of right elbow, initial encounter: Secondary | ICD-10-CM | POA: Insufficient documentation

## 2023-09-10 MED ORDER — ACETAMINOPHEN 500 MG PO TABS
1000.0000 mg | ORAL_TABLET | Freq: Once | ORAL | Status: AC
  Administered 2023-09-10: 1000 mg via ORAL
  Filled 2023-09-10: qty 2

## 2023-09-10 NOTE — ED Notes (Signed)
 DC paperwork given and verbally understood.

## 2023-09-10 NOTE — ED Provider Notes (Signed)
 [place] EMERGENCY DEPARTMENT AT [facility] Provider Note   CSN: [id number] Arrival date & time: 09/10/23  1305     Patient presents with: Joshua [name] is a 78 y.o. male.   Patient with mechanical fall today.  Tripped and fell over power washer hose.  Landed on his right knee right elbow where he has abrasions.  He has an abrasion to his forehead and right cheek.  He is on Plavix .  He is feeling better now.  He was a little bit unstable and dizzy after.  Denies any weakness numbness tingling.  Denies any chest pain shortness of breath.  No pain elsewhere.  No neck pain.  Did not lose consciousness.  The history is provided by the patient.       Prior to Admission medications   Medication Sig Start Date End Date Taking? Authorizing Provider  acetaminophen  (TYLENOL ) 325 MG tablet Take 1-2 tablets (325-650 mg total) by mouth every 4 (four) hours as needed for mild pain (pain score 1-3). 01/27/23   Angiulli, Toribio PARAS, PA-C  albuterol  (VENTOLIN  HFA) 108 (90 Base) MCG/ACT inhaler Inhale 2 puffs into the lungs every 4 (four) hours as needed for wheezing or shortness of breath. Patient not taking: Reported on 08/16/2023 06/22/22   [provider]  amoxicillin (AMOXIL) 500 MG tablet Take 500 mg by mouth as needed. For dental visits. Patient not taking: Reported on 08/16/2023 04/13/23   [provider]  azelastine  (OPTIVAR ) 0.05 % ophthalmic solution Place 1 drop into both eyes 2 (two) times daily. 06/17/22   [provider]  Biotin 89999 MCG TABS Take 1,000 mcg by mouth in the morning.    [provider]  carbidopa -levodopa  (SINEMET  CR) 50-200 MG tablet TAKE 1 TABLET BY MOUTH EVERYDAY AT BEDTIME 03/01/23   Tat, Rebecca S, DO  carbidopa -levodopa  (SINEMET  IR) 25-100 MG tablet 2 AT 7 AM, 2 AT 10 AM,2 AT 1PM,2 AT 4 PM, 2 AT 7 pm 03/01/23   Tat, Asberry RAMAN, DO  cholecalciferol  (VITAMIN D3) 25 MCG (1000 UNIT) tablet Take 1,000 Units by mouth daily.     [provider]  clopidogrel  (PLAVIX ) 75 MG tablet Take 1 tablet (75 mg total) by mouth daily. 03/01/23   Tat, Asberry RAMAN, DO  cyanocobalamin (VITAMIN B12) 1000 MCG tablet Take 1,000 mcg by mouth daily.    [provider]  docusate sodium  (COLACE) 100 MG capsule Take 2 capsules (200 mg total) by mouth 2 (two) times daily as needed for mild constipation. 01/27/23   Angiulli, Toribio PARAS, PA-C  DYMISTA  137-50 MCG/ACT SUSP Place 1 puff into both nostrils at bedtime.  03/24/11   [provider]  escitalopram  (LEXAPRO ) 20 MG tablet Take 20 mg by mouth daily.    [provider]  finasteride  (PROSCAR ) 5 MG tablet Take 5 mg by mouth every evening. 04/17/11   [provider]  HYDROcodone-acetaminophen  (NORCO/VICODIN) 5-325 MG tablet Take 1 tablet by mouth every 8 (eight) hours as needed. 05/06/23   [provider]  lansoprazole  (PREVACID ) 30 MG capsule Take 30 mg by mouth 2 (two) times daily. Morning & mid-afternoon 11/12/19   [provider]  levocetirizine (XYZAL ) 5 MG tablet Take 5 mg by mouth every evening.      [provider]  Menthol , Topical Analgesic, (BIOFREEZE EX) Apply 1 Application topically daily as needed (pain).    [provider]  methocarbamol  (ROBAXIN ) 500 MG tablet Take 500 mg by mouth every 6 (  six) hours as needed.    [provider]  metoprolol  succinate (TOPROL -XL) 25 MG 24 hr tablet TAKE 1/2 TABLET BY MOUTH DAILY 06/24/21   Henry Shaver B, NP  mexiletine (MEXITIL ) 150 MG capsule TAKE 1 CAPSULE BY MOUTH TWICE A DAY 09/09/23   Camnitz, Will Gladis, MD  midodrine  (PROAMATINE ) 5 MG tablet TAKE 1 TABLET BY MOUTH 2 TIMES DAILY AS NEEDED. TAKE IF SYSTOLIC BLOOD PRESSURE GETS BELOW 100 09/07/22   Camnitz, Soyla Gladis, MD  montelukast  (SINGULAIR ) 10 MG tablet Take 10 mg by mouth at bedtime.      [provider]  nitroGLYCERIN  (NITROSTAT ) 0.4 MG SL tablet Place 1 tablet (0.4 mg total) under the tongue  every 5 (five) minutes as needed for chest pain (Do not give more than 3 SL tablets in 15 minutes.). Patient not taking: Reported on 08/16/2023 02/07/21   Henry Shaver B, NP  Omega-3 Fatty Acids  (FISH OIL) 1000 MG CAPS Take 1,000 mg by mouth in the morning.    [provider]  polyethylene glycol (MIRALAX ) 17 g packet Take 17 g by mouth daily. 09/28/22   Renae Bernarda HERO, PA-C  PROCTOSOL HC 2.5 % rectal cream APPLY INTO AND AROUND RECTUM 2 TIMES A DAY 02/12/17   Abran Norleen SAILOR, MD  rOPINIRole  (REQUIP ) 1 MG tablet TAKE 1 TABLET BY MOUTH THREE TIMES A DAY 08/30/23   Tat, Asberry RAMAN, DO  rosuvastatin  (CRESTOR ) 20 MG tablet Take 1 tablet (20 mg total) by mouth at bedtime. 02/07/21   Henry Shaver NOVAK, NP  tamsulosin  (FLOMAX ) 0.4 MG CAPS capsule Take 0.4 mg by mouth daily after supper.  05/25/13   [provider]  traMADol  (ULTRAM ) 50 MG tablet Take 50 mg by mouth every 8 (eight) hours as needed for moderate pain (pain score 4-6). Patient not taking: Reported on 08/16/2023 10/13/22   [provider]  zolpidem  (AMBIEN ) 10 MG tablet Take 10 mg by mouth at bedtime. 05/13/11   [provider]    Allergies: Floxin i.v. in dextrose  5% [ofloxacin] and Terazosin    Review of Systems  Updated Vital Signs BP 125/82   Pulse 79   Temp 98.6 F (37 C)   Resp 11   SpO2 96%   Physical Exam Vitals and nursing note reviewed.  Constitutional:      General: He is not in acute distress.    Appearance: He is well-developed. He is not ill-appearing.  HENT:     Nose: Nose normal.     Mouth/Throat:     Mouth: Mucous membranes are moist.  Eyes:     Extraocular Movements: Extraocular movements intact.     Conjunctiva/sclera: Conjunctivae normal.     Pupils: Pupils are equal, round, and reactive to light.  Cardiovascular:     Rate and Rhythm: Normal rate and regular rhythm.     Pulses: Normal pulses.     Heart sounds: Normal heart sounds. No murmur heard. Pulmonary:     Effort:  Pulmonary effort is normal. No respiratory distress.     Breath sounds: Normal breath sounds.  Abdominal:     Palpations: Abdomen is soft.     Tenderness: There is no abdominal tenderness.  Musculoskeletal:        General: Tenderness present. No swelling.     Cervical back: Normal range of motion and neck supple.     Comments: Tenderness to the right elbow, tenderness to the right knee, abrasions to the right knee and right elbow  Skin:    General: Skin is warm and dry.     Capillary Refill: Capillary refill takes less than 2 seconds.     Comments: Abrasion over the forehead and cheekbone  Neurological:     Mental Status: He is alert.  Psychiatric:        Mood and Affect: Mood normal.     (all labs ordered are listed, but only abnormal results are displayed) Labs Reviewed - No data to display  EKG: EKG Interpretation Date/Time:  Friday September 10 2023 13:24:12 EDT Ventricular Rate:  78 PR Interval:  214 QRS Duration:  151 QT Interval:  428 QTC Calculation: 488 R Axis:   -37  Text Interpretation: Sinus rhythm Borderline prolonged PR interval Right bundle branch block Confirmed by Ruthe Cornet [id number]) on 09/10/2023 2:02:39 PM  Radiology: [facility] Elbow Complete Right Result Date: 09/10/2023 CLINICAL DATA:  pain, fall on right side EXAM: RIGHT ELBOW - COMPLETE 3+ VIEW COMPARISON:  None Available. FINDINGS: No acute fracture or dislocation. No joint effusion. There is no evidence of arthropathy or other focal bone abnormality. Soft tissues are unremarkable. IMPRESSION: No acute fracture or dislocation. Electronically Signed   By: Rogelia Myers M.D.   On: 09/10/2023 13:57   DG Knee Complete 4 Views Right Result Date: 09/10/2023 CLINICAL DATA:  pain, fall on right side EXAM: RIGHT KNEE - COMPLETE 4+ VIEW COMPARISON:  January 25, 2023 FINDINGS: Well-aligned knee arthroplasty without acute fracture.No periprosthetic lucency to suggest loosening.No dislocation. No joint effusion.  IMPRESSION: Well-aligned total knee arthroplasty without acute fracture or dislocation.No findings to suggest loosening. Electronically Signed   By: Rogelia Myers M.D.   On: 09/10/2023 13:56   CT Head Wo Contrast Result Date: 09/10/2023 CLINICAL DATA:  Polytrauma, blunt; Facial trauma, blunt EXAM: CT HEAD WITHOUT CONTRAST CT CERVICAL SPINE WITHOUT CONTRAST TECHNIQUE: Multidetector CT imaging of the head and cervical spine was performed following the standard protocol without intravenous contrast. Multiplanar CT image reconstructions of the cervical spine were also generated. RADIATION DOSE REDUCTION: This exam was performed according to the departmental dose-optimization program which includes automated exposure control, adjustment of the mA and/or kV according to patient size and/or use of iterative reconstruction technique. COMPARISON:  CT head and CT cervical spine June 8, 25. FINDINGS: CT HEAD FINDINGS Brain: No evidence of acute infarction, hemorrhage, hydrocephalus, extra-axial collection or mass lesion/mass effect. Remote left frontal infarct. Patchy white matter hypodensities, compatible with chronic microvascular ischemic change. Cerebral atrophy. Vascular: No hyperdense vessel.  Calcific atherosclerosis. Skull: No acute fracture. Sinuses/Orbits: Clear sinuses. No acute orbital findings. Other: No mastoid effusions. CT CERVICAL SPINE FINDINGS Alignment: Mild anterolisthesis of C2 on C3 and C4 on C5 is degenerate etiology and unchanged from the prior. No traumatic malalignment. Skull base and vertebrae: No evidence of acute fracture. Vertebral body heights are maintained. Solid C5-C6 ACDF. Soft tissues and spinal canal: No prevertebral fluid or swelling. No visible canal hematoma. Disc levels: Similar multilevel degenerative change. Upper chest: Visualized lung apices are clear. IMPRESSION: No evidence of acute abnormality intracranially or in the cervical spine. Electronically Signed   By: Gilmore GORMAN Molt M.D.   On: 09/10/2023 13:56   CT Cervical Spine Wo Contrast Result Date: 09/10/2023 CLINICAL DATA:  Polytrauma, blunt; Facial trauma, blunt EXAM: CT HEAD WITHOUT CONTRAST CT CERVICAL SPINE WITHOUT CONTRAST TECHNIQUE: Multidetector CT imaging of the head and cervical spine was performed following the standard protocol without intravenous contrast. Multiplanar CT image reconstructions of the cervical spine were also generated.  RADIATION DOSE REDUCTION: This exam was performed according to the departmental dose-optimization program which includes automated exposure control, adjustment of the mA and/or kV according to patient size and/or use of iterative reconstruction technique. COMPARISON:  CT head and CT cervical spine June 8, 25. FINDINGS: CT HEAD FINDINGS Brain: No evidence of acute infarction, hemorrhage, hydrocephalus, extra-axial collection or mass lesion/mass effect. Remote left frontal infarct. Patchy white matter hypodensities, compatible with chronic microvascular ischemic change. Cerebral atrophy. Vascular: No hyperdense vessel.  Calcific atherosclerosis. Skull: No acute fracture. Sinuses/Orbits: Clear sinuses. No acute orbital findings. Other: No mastoid effusions. CT CERVICAL SPINE FINDINGS Alignment: Mild anterolisthesis of C2 on C3 and C4 on C5 is degenerate etiology and unchanged from the prior. No traumatic malalignment. Skull base and vertebrae: No evidence of acute fracture. Vertebral body heights are maintained. Solid C5-C6 ACDF. Soft tissues and spinal canal: No prevertebral fluid or swelling. No visible canal hematoma. Disc levels: Similar multilevel degenerative change. Upper chest: Visualized lung apices are clear. IMPRESSION: No evidence of acute abnormality intracranially or in the cervical spine. Electronically Signed   By: Gilmore GORMAN Molt M.D.   On: 09/10/2023 13:56   CT Maxillofacial Wo Contrast Result Date: 09/10/2023 CLINICAL DATA:  Polytrauma, blunt; Facial trauma, blunt  EXAM: CT HEAD WITHOUT CONTRAST CT CERVICAL SPINE WITHOUT CONTRAST TECHNIQUE: Multidetector CT imaging of the head and cervical spine was performed following the standard protocol without intravenous contrast. Multiplanar CT image reconstructions of the cervical spine were also generated. RADIATION DOSE REDUCTION: This exam was performed according to the departmental dose-optimization program which includes automated exposure control, adjustment of the mA and/or kV according to patient size and/or use of iterative reconstruction technique. COMPARISON:  CT head and CT cervical spine June 8, 25. FINDINGS: CT HEAD FINDINGS Brain: No evidence of acute infarction, hemorrhage, hydrocephalus, extra-axial collection or mass lesion/mass effect. Remote left frontal infarct. Patchy white matter hypodensities, compatible with chronic microvascular ischemic change. Cerebral atrophy. Vascular: No hyperdense vessel.  Calcific atherosclerosis. Skull: No acute fracture. Sinuses/Orbits: Clear sinuses. No acute orbital findings. Other: No mastoid effusions. CT CERVICAL SPINE FINDINGS Alignment: Mild anterolisthesis of C2 on C3 and C4 on C5 is degenerate etiology and unchanged from the prior. No traumatic malalignment. Skull base and vertebrae: No evidence of acute fracture. Vertebral body heights are maintained. Solid C5-C6 ACDF. Soft tissues and spinal canal: No prevertebral fluid or swelling. No visible canal hematoma. Disc levels: Similar multilevel degenerative change. Upper chest: Visualized lung apices are clear. IMPRESSION: No evidence of acute abnormality intracranially or in the cervical spine. Electronically Signed   By: Gilmore GORMAN Molt M.D.   On: 09/10/2023 13:56     Procedures   Medications Ordered in the ED  acetaminophen  (TYLENOL ) tablet 1,000 mg (1,000 mg Oral Given 09/10/23 1404)                                    Medical Decision Making Amount and/or Complexity of Data Reviewed Radiology:  ordered.  Risk OTC drugs.   Pedro Whiters is here after mechanical fall.  Tripped over a power hose washer.  On Plavix .  Hit his head.  Did not lose consciousness.  Abrasion to his forehead right side of his face right knee right elbow.  Prior knee replacement in the past.  He is overall well-appearing otherwise.  Was a little bit dizzy after the fall but now feeling better.  CT scan of  his head neck face were obtained which were unremarkable.  X-ray of his right elbow and right knee were unremarkable.  I reviewed interpreted imaging.  EKG shows sinus rhythm.  He has no lacerations.  Recommend Neosporin and bacitracin ointment twice daily.  Tylenol  for pain control.  Recommend ice as well.  Discharged in good condition.  Understands return precautions.  This chart was dictated using voice recognition software.  Despite best efforts to proofread,  errors can occur which can change the documentation meaning.      Final diagnoses:  Abrasion of face, initial encounter  Abrasion of right knee, initial encounter    ED Discharge Orders     None          Ruthe Cornet, DO 09/10/23 1447

## 2023-09-10 NOTE — Discharge Instructions (Signed)
 Recommend bacitracin or Neosporin ointment twice daily to your wounds.  Tylenol  1000 mg every 6 hours as needed for pain.

## 2023-09-10 NOTE — ED Triage Notes (Signed)
 Pt c/o mechanical fall today, tripped over power-washer hose. Fell onto R knee, R elbow, abrasions to R cheek, forehead. Compliant w plavix , bleeding controlled at time of triage. Pt also advises a little dizziness, sometimes I just feel unstable. Pt unsure if dizziness r/t parkinsons hx or fall.

## 2023-09-22 ENCOUNTER — Other Ambulatory Visit: Payer: Self-pay | Admitting: Cardiology

## 2023-10-21 ENCOUNTER — Ambulatory Visit: Admitting: Physical Therapy

## 2023-10-21 NOTE — Therapy (Incomplete)
 OUTPATIENT PHYSICAL THERAPY NEURO EVALUATION   Patient Name: Joshua [name] MRN: [id number] DOB:[birth year], 78 y.o., male Today's Date: 10/21/2023   PCP: Loreli Elsie JONETTA Mickey., MD REFERRING PROVIDER: Loreli Elsie JONETTA Mickey., MD  END OF SESSION:   Past Medical History:  Diagnosis Date   Allergic rhinitis    Anxiety    from chronic pain from surgery- on Cymbalta    Arthritis    Asthma    Barrett's esophagus 03/29/2014   Carotid artery disease    Carotid Doppler normal August, 2007   Coronary artery disease    Diverticulosis    Dyslipidemia    GERD (gastroesophageal reflux disease)    History of loop recorder    has since 04/04/15   HTN (hypertension)    takes Metoprolol  for PVC control   Hx of colonic polyps    adenomatous   IFG (impaired fasting glucose)    Myocardial infarction (HCC)    mild   Neuromuscular disorder (HCC)    parkinson's tremors in hands   Palpitations    Benign PVCs   Parkinson disease (HCC)    Pneumonia    Prostate cancer (HCC)    RBBB (right bundle branch block)    rate related   Shingles    Stroke [facility]) 2017   January 01, 2023,right hand weakness,   TIA (transient ischemic attack) 02/2015   Per pt, had 2 strokes   Vertigo    Past Surgical History:  Procedure Laterality Date   BACK SURGERY  2002,2009   x 6   CHOLECYSTECTOMY  11/30/2012   with IOC   COLONOSCOPY     ELECTROPHYSIOLOGIC STUDY N/A 09/26/2015   Procedure: V Tach Ablation (PVC);  Surgeon: Will Gladis Norton, MD;  Location: [facility];  Service: Cardiovascular;  Laterality: N/A;   EP IMPLANTABLE DEVICE N/A 04/04/2015   Procedure: Loop Recorder Insertion;  Surgeon: Will Gladis Norton, MD;  Location: [facility];  Service: Cardiovascular;  Laterality: N/A;   HERNIA REPAIR     laprascopic   KNEE SURGERY     DECEMBER 2017,LEFT KNEE SCOPED   LEFT HEART CATH AND CORONARY ANGIOGRAPHY N/A 02/05/2021   Procedure: LEFT HEART CATH AND CORONARY ANGIOGRAPHY;   Surgeon: Darron Deatrice LABOR, MD;  Location: [facility];  Service: Cardiovascular;  Laterality: N/A;   NECK SURGERY  2002   POLYPECTOMY     ROTATOR CUFF REPAIR Left    TEE WITHOUT CARDIOVERSION N/A 04/04/2015   Procedure: TRANSESOPHAGEAL ECHOCARDIOGRAM (TEE);  Surgeon: Redell GORMAN Shallow, MD;  Location: [facility] ENDOSCOPY;  Service: Cardiovascular;  Laterality: N/A;   TOTAL KNEE ARTHROPLASTY Left 09/28/2022   Procedure: TOTAL KNEE ARTHROPLASTY;  Surgeon: Edna Toribio LABOR, MD;  Location: [facility];  Service: Orthopedics;  Laterality: Left;   TOTAL KNEE ARTHROPLASTY Right 01/11/2023   Procedure: TOTAL KNEE ARTHROPLASTY;  Surgeon: Edna Toribio LABOR, MD;  Location: [facility];  Service: Orthopedics;  Laterality: Right;   TRIGGER FINGER RELEASE Left 12/28/2019   Procedure: RELEASE TRIGGER FINGER/A-1 PULLEY THUMB, MIDDLE AND RING;  Surgeon: Murrell Kuba, MD;  Location: [facility];  Service: Orthopedics;  Laterality: Left;  FAB   UPPER GASTROINTESTINAL ENDOSCOPY     V Tach ablation  09/26/2015   Patient Active Problem List   Diagnosis Date Noted   CVA (cerebral vascular accident) (HCC) 01/15/2023   Primary osteoarthritis of right knee 01/11/2023   Localized osteoarthritis of right knee 01/11/2023   Primary osteoarthritis of left knee 09/28/2022  SBO (small bowel obstruction) (HCC) 05/29/2022   Chest pain 05/26/2022   Epigastric abdominal pain 05/26/2022   Coronary artery disease involving native coronary artery of native heart without angina pectoris 05/28/2021   History of myocardial infarction 02/05/2021   Parkinson's disease (HCC)    Malignant neoplasm of prostate (HCC) 11/01/2017   Essential tremor 12/06/2015   PVC (premature ventricular contraction) 09/26/2015   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) 04/07/2015   Acute CVA (cerebrovascular accident) (HCC) 03/06/2015   Stroke (cerebrum) (HCC) 03/05/2015   Cerebral infarction (HCC) 03/05/2015   Weakness  of right upper extremity    Tremor    Ejection fraction    Vertigo    Sleep apnea    Palpitations    Hyperlipidemia with target LDL less than 70    GERD (gastroesophageal reflux disease)    HTN (hypertension)    Chest pain    RBBB (right bundle branch block)    Ejection fraction    Carotid artery disease (HCC)     ONSET DATE: 10/15/2023 (referral)   REFERRING DIAG: R29.6 (ICD-10-CM) - Recurrent falls  THERAPY DIAG:  No diagnosis found.  Rationale for Evaluation and Treatment: Rehabilitation  SUBJECTIVE:                                                                                                                                                                                             SUBJECTIVE STATEMENT: *** Pt accompanied by: {accompnied:27141}  PERTINENT HISTORY: Rt TKA 01/11/23 with subsequent acute Lt CVA affecting Rt side, PD, Anxiety, HTN, HLD, multiple back surgeries/fusion neck and lumbar spine,  NSTEMI, 01/2020, hx of multiple syncopal episodes, CVA in 2017  PAIN:  Are you having pain? {OPRCPAIN:27236}  PRECAUTIONS: {Therapy precautions:24002}  RED FLAGS: {PT Red Flags:29287}   WEIGHT BEARING RESTRICTIONS: {Yes ***/No:24003}  FALLS: Has patient fallen in last 6 months? {fallsyesno:27318}  LIVING ENVIRONMENT: Lives with: {OPRC lives with:25569::lives with their family} Lives in: {Lives in:25570} Stairs: {opstairs:27293} Has following equipment at home: {Assistive devices:23999}  PLOF: {PLOF:24004}  PATIENT GOALS: ***  OBJECTIVE:  Note: Objective measures were completed at Evaluation unless otherwise noted.  DIAGNOSTIC FINDINGS: ***  COGNITION: Overall cognitive status: {cognition:24006}   SENSATION: {sensation:27233}  COORDINATION: ***  EDEMA:  {edema:24020}  MUSCLE TONE: {LE tone:25568}  MUSCLE LENGTH: Hamstrings: Right *** deg; Left *** deg Debby test: Right *** deg; Left *** deg  DTRs:  {DTR SITE:24025}  POSTURE:  {posture:25561}  LOWER EXTREMITY ROM:     {AROM/PROM:27142}  Right Eval Left Eval  Hip flexion    Hip extension    Hip abduction  Hip adduction    Hip internal rotation    Hip external rotation    Knee flexion    Knee extension    Ankle dorsiflexion    Ankle plantarflexion    Ankle inversion    Ankle eversion     (Blank rows = not tested)  LOWER EXTREMITY MMT:    MMT Right Eval Left Eval  Hip flexion    Hip extension    Hip abduction    Hip adduction    Hip internal rotation    Hip external rotation    Knee flexion    Knee extension    Ankle dorsiflexion    Ankle plantarflexion    Ankle inversion    Ankle eversion    (Blank rows = not tested)  BED MOBILITY:  {bed mobility:32615:p}  TRANSFERS: {transfers eval:32620}  RAMP:  {ramp eval:32616}  CURB:  {curb eval:32617}  STAIRS: {stairs eval:32618} GAIT: Findings: {GaitneuroPT:32644::Distance walked: ***,Comments: ***}  FUNCTIONAL TESTS:  {Functional tests:24029}  PATIENT SURVEYS:  {rehab surveys:24030}                                                                                                                              TREATMENT DATE: ***    PATIENT EDUCATION: Education details: *** Person educated: {Person educated:25204} Education method: {Education Method:25205} Education comprehension: {Education Comprehension:25206}  HOME EXERCISE PROGRAM: ***  GOALS: Goals reviewed with patient? {yes/no:20286}  SHORT TERM GOALS: Target date: ***  *** Baseline: Goal status: INITIAL  2.  *** Baseline:  Goal status: INITIAL  3.  *** Baseline:  Goal status: INITIAL  4.  *** Baseline:  Goal status: INITIAL  5.  *** Baseline:  Goal status: INITIAL  6.  *** Baseline:  Goal status: INITIAL  LONG TERM GOALS: Target date: ***  *** Baseline:  Goal status: INITIAL  2.  *** Baseline:  Goal status: INITIAL  3.  *** Baseline:  Goal status: INITIAL  4.   *** Baseline:  Goal status: INITIAL  5.  *** Baseline:  Goal status: INITIAL  6.  *** Baseline:  Goal status: INITIAL  ASSESSMENT:  CLINICAL IMPRESSION: Patient is a 78 year old male referred to Neuro OPPT for recurrent falls.   Pt's PMH is significant for: Rt TKA 01/11/23 with subsequent acute Lt CVA affecting Rt side, PD, Anxiety, HTN, HLD, multiple back surgeries/fusion neck and lumbar spine,  NSTEMI, 01/2020, hx of multiple syncopal episodes, CVA in 2017. The following deficits were present during the exam: ***. Based on ***, pt is an incr risk for falls. Pt would benefit from skilled PT to address these impairments and functional limitations to maximize functional mobility independence   OBJECTIVE IMPAIRMENTS: {opptimpairments:25111}.   ACTIVITY LIMITATIONS: {activitylimitations:27494}  PARTICIPATION LIMITATIONS: {participationrestrictions:25113}  PERSONAL FACTORS: {Personal factors:25162} are also affecting patient's functional outcome.   REHAB POTENTIAL: {rehabpotential:25112}  CLINICAL DECISION MAKING: {clinical decision making:25114}  EVALUATION COMPLEXITY: {Evaluation complexity:25115}  PLAN:  PT FREQUENCY: {rehab frequency:25116}  PT  DURATION: {rehab duration:25117}  PLANNED INTERVENTIONS: {rehab planned interventions:25118::97110-Therapeutic exercises,97530- Therapeutic [phone]- Neuromuscular re-education,97535- Self Rjmz,02859- Manual therapy,Patient/Family education}  PLAN FOR NEXT SESSION: ***   Pansy Ostrovsky E Chadric Kimberley, PT, DPT 10/21/2023, 8:10 AM

## 2023-10-27 ENCOUNTER — Ambulatory Visit: Attending: Internal Medicine | Admitting: Physical Therapy

## 2023-10-27 ENCOUNTER — Telehealth: Payer: Self-pay | Admitting: Physical Therapy

## 2023-10-27 ENCOUNTER — Encounter: Payer: Self-pay | Admitting: Physical Therapy

## 2023-10-27 DIAGNOSIS — M6281 Muscle weakness (generalized): Secondary | ICD-10-CM | POA: Insufficient documentation

## 2023-10-27 DIAGNOSIS — R29818 Other symptoms and signs involving the nervous system: Secondary | ICD-10-CM | POA: Diagnosis present

## 2023-10-27 DIAGNOSIS — G250 Essential tremor: Secondary | ICD-10-CM | POA: Insufficient documentation

## 2023-10-27 DIAGNOSIS — R2681 Unsteadiness on feet: Secondary | ICD-10-CM | POA: Diagnosis present

## 2023-10-27 DIAGNOSIS — R2689 Other abnormalities of gait and mobility: Secondary | ICD-10-CM | POA: Insufficient documentation

## 2023-10-27 DIAGNOSIS — R293 Abnormal posture: Secondary | ICD-10-CM | POA: Diagnosis present

## 2023-10-27 DIAGNOSIS — M5459 Other low back pain: Secondary | ICD-10-CM | POA: Insufficient documentation

## 2023-10-27 DIAGNOSIS — R278 Other lack of coordination: Secondary | ICD-10-CM | POA: Insufficient documentation

## 2023-10-27 DIAGNOSIS — Z9181 History of falling: Secondary | ICD-10-CM | POA: Insufficient documentation

## 2023-10-27 DIAGNOSIS — R29898 Other symptoms and signs involving the musculoskeletal system: Secondary | ICD-10-CM | POA: Diagnosis present

## 2023-10-27 NOTE — Telephone Encounter (Signed)
 Dr. Loreli,  Lamar Isaiah Fireman  was evaluated by PT on 10/27/2023.  The patient would benefit from OT and ST evaluation for PD and RUE weakness    If you agree, please place an order in [facility]que [facility] or fax the order to [phone].  Thank you,  Marlon FORBES Dural, PT, DPT [employer] [address] [place], [place]  [address] Phone:  [phone] Fax:  [phone]

## 2023-10-27 NOTE — Therapy (Signed)
 OUTPATIENT PHYSICAL THERAPY NEURO EVALUATION   Patient Name: Obbie Lewallen MRN: [id number] DOB:09/29/1945, 78 y.o., male Today's Date: 10/27/2023   PCP: Loreli Elsie JONETTA Mickey., MD REFERRING PROVIDER: Loreli Elsie JONETTA Mickey., MD  END OF SESSION:  PT End of Session - 10/27/23 1022     Visit Number 1    Number of Visits 17   Plus eval   Date for Recertification  01/05/24    Authorization Type [employer]    PT Start Time 1018    PT Stop Time 1056    PT Time Calculation (min) 38 min    Activity Tolerance Patient tolerated treatment well    Behavior During Therapy [facility] for tasks assessed/performed          Past Medical History:  Diagnosis Date   Allergic rhinitis    Anxiety    from chronic pain from surgery- on Cymbalta    Arthritis    Asthma    Barrett's esophagus 03/29/2014   Carotid artery disease    Carotid Doppler normal August, 2007   Coronary artery disease    Diverticulosis    Dyslipidemia    GERD (gastroesophageal reflux disease)    History of loop recorder    has since 04/04/15   HTN (hypertension)    takes Metoprolol  for PVC control   Hx of colonic polyps    adenomatous   IFG (impaired fasting glucose)    Myocardial infarction (HCC)    mild   Neuromuscular disorder (HCC)    parkinson's tremors in hands   Palpitations    Benign PVCs   Parkinson disease (HCC)    Pneumonia    Prostate cancer (HCC)    RBBB (right bundle branch block)    rate related   Shingles    Stroke [facility]) 2017   January 01, 2023,right hand weakness,   TIA (transient ischemic attack) 02/2015   Per pt, had 2 strokes   Vertigo    Past Surgical History:  Procedure Laterality Date   BACK SURGERY  2002,2009   x 6   CHOLECYSTECTOMY  11/30/2012   with IOC   COLONOSCOPY     ELECTROPHYSIOLOGIC STUDY N/A 09/26/2015   Procedure: V Tach Ablation (PVC);  Surgeon: Will Gladis Norton, MD;  Location: [facility] INVASIVE CV LAB;  Service: Cardiovascular;  Laterality: N/A;   EP IMPLANTABLE  DEVICE N/A 04/04/2015   Procedure: Loop Recorder Insertion;  Surgeon: Will Gladis Norton, MD;  Location: [facility] INVASIVE CV LAB;  Service: Cardiovascular;  Laterality: N/A;   HERNIA REPAIR     laprascopic   KNEE SURGERY     DECEMBER 2017,LEFT KNEE SCOPED   LEFT HEART CATH AND CORONARY ANGIOGRAPHY N/A 02/05/2021   Procedure: LEFT HEART CATH AND CORONARY ANGIOGRAPHY;  Surgeon: Darron Deatrice LABOR, MD;  Location: [facility] INVASIVE CV LAB;  Service: Cardiovascular;  Laterality: N/A;   NECK SURGERY  2002   POLYPECTOMY     ROTATOR CUFF REPAIR Left    TEE WITHOUT CARDIOVERSION N/A 04/04/2015   Procedure: TRANSESOPHAGEAL ECHOCARDIOGRAM (TEE);  Surgeon: Redell GORMAN Shallow, MD;  Location: [facility] ENDOSCOPY;  Service: Cardiovascular;  Laterality: N/A;   TOTAL KNEE ARTHROPLASTY Left 09/28/2022   Procedure: TOTAL KNEE ARTHROPLASTY;  Surgeon: Edna Toribio LABOR, MD;  Location: [facility] ORS;  Service: Orthopedics;  Laterality: Left;   TOTAL KNEE ARTHROPLASTY Right 01/11/2023   Procedure: TOTAL KNEE ARTHROPLASTY;  Surgeon: Edna Toribio LABOR, MD;  Location: [facility] ORS;  Service: Orthopedics;  Laterality: Right;   TRIGGER FINGER RELEASE Left 12/28/2019  Procedure: RELEASE TRIGGER FINGER/A-1 PULLEY THUMB, MIDDLE AND RING;  Surgeon: Murrell Kuba, MD;  Location: [facility];  Service: Orthopedics;  Laterality: Left;  FAB   UPPER GASTROINTESTINAL ENDOSCOPY     V Tach ablation  09/26/2015   Patient Active Problem List   Diagnosis Date Noted   CVA (cerebral vascular accident) (HCC) 01/15/2023   Primary osteoarthritis of right knee 01/11/2023   Localized osteoarthritis of right knee 01/11/2023   Primary osteoarthritis of left knee 09/28/2022   SBO (small bowel obstruction) (HCC) 05/29/2022   Chest pain 05/26/2022   Epigastric abdominal pain 05/26/2022   Coronary artery disease involving native coronary artery of native heart without angina pectoris 05/28/2021   History of myocardial infarction 02/05/2021    Parkinson's disease (HCC)    Malignant neoplasm of prostate (HCC) 11/01/2017   Essential tremor 12/06/2015   PVC (premature ventricular contraction) 09/26/2015   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) 04/07/2015   Acute CVA (cerebrovascular accident) (HCC) 03/06/2015   Stroke (cerebrum) (HCC) 03/05/2015   Cerebral infarction (HCC) 03/05/2015   Weakness of right upper extremity    Tremor    Ejection fraction    Vertigo    Sleep apnea    Palpitations    Hyperlipidemia with target LDL less than 70    GERD (gastroesophageal reflux disease)    HTN (hypertension)    Chest pain    RBBB (right bundle branch block)    Ejection fraction    Carotid artery disease (HCC)     ONSET DATE: 10/15/2023 (referral)   REFERRING DIAG: R29.6 (ICD-10-CM) - Recurrent falls  THERAPY DIAG:  Muscle weakness (generalized)  Unsteadiness on feet  History of falling  Abnormal posture  Other abnormalities of gait and mobility  Other lack of coordination  Rationale for Evaluation and Treatment: Rehabilitation  SUBJECTIVE:                                                                                                                                                                                             SUBJECTIVE STATEMENT: Pt, who is well known to therapist, presents w/rollator. Reports he has had 3 falls since July, all due to tripping over something w/his RLE. Started using the rollator after his most recent fall in September due to not feeling stable with the cane. Cannot position his feet well at times which makes him feel uncomfortable. Does not need AD at home, is frequently getting onto the floor to do his back HEP. Feels like his vision is contributing to his balance as well but denies double or blurry vision.   Exercises  pretty regularly, does his hand, knee and low back exercises. Rides his recumbent bike a couple days per week. Has been working on writing as well and  improving strength in his R hand.   Pt accompanied by: self  PERTINENT HISTORY: Rt TKA 01/11/23 with subsequent acute Lt CVA affecting Rt side, PD, Anxiety, HTN, HLD, multiple back surgeries/fusion neck and lumbar spine,  NSTEMI, 01/2020, hx of multiple syncopal episodes, CVA in 2017  PAIN:  Are you having pain? Yes: NPRS scale: 7/10 Pain location: low back (L2) Pain description: Achy  Aggravating factors: Too much movement  Relieving factors: Ablation, medications   PRECAUTIONS: Fall  RED FLAGS: None   WEIGHT BEARING RESTRICTIONS: No  FALLS: Has patient fallen in last 6 months? Yes. Number of falls 3  LIVING ENVIRONMENT: Lives with: lives with their spouse Lives in: House/apartment Stairs: Yes: External: 1 steps; none Has following equipment at home: Single point cane, Walker - 2 wheeled, Environmental consultant - 4 wheeled, shower chair, Grab bars, and [employer] and transport chair  PLOF: Independent  PATIENT GOALS: Just to work on my balance   OBJECTIVE:  Note: Objective measures were completed at Evaluation unless otherwise noted.  DIAGNOSTIC FINDINGS: MRI of Lumbar spine from 05/15/23  IMPRESSION: 1. Operative changes of posterior fusion from L3 through S1. Interbody fusion at these levels. Prior laminotomies at L3-L4 and L5-S1. 2. Moderate foraminal stenoses on the right at L3-L4 and bilaterally at L4-L5. 3. Mild canal stenosis at L2-L3 and L3-L4.  CT of head from 09/10/23 CT CERVICAL SPINE FINDINGS   Alignment: Mild anterolisthesis of C2 on C3 and C4 on C5 is degenerate etiology and unchanged from the prior. No traumatic malalignment.   Skull base and vertebrae: No evidence of acute fracture. Vertebral body heights are maintained. Solid C5-C6 ACDF.   Soft tissues and spinal canal: No prevertebral fluid or swelling. No visible canal hematoma.   Disc levels: Similar multilevel degenerative change.   Upper chest: Visualized lung apices are clear.   IMPRESSION: No  evidence of acute abnormality intracranially or in the cervical spine.  COGNITION: Overall cognitive status: Within functional limits for tasks assessed   SENSATION: Pt reports bilateral numbness/tingling in distal BLEs from feet to knees    POSTURE: rounded shoulders, forward head, increased thoracic kyphosis, posterior pelvic tilt, and weight shift right  LOWER EXTREMITY ROM:     Active  Right Eval Left Eval  Hip flexion    Hip extension    Hip abduction    Hip adduction    Hip internal rotation    Hip external rotation    Knee flexion    Knee extension    Ankle dorsiflexion    Ankle plantarflexion    Ankle inversion    Ankle eversion     (Blank rows = not tested)  LOWER EXTREMITY MMT:    MMT Right Eval Left Eval  Hip flexion    Hip extension    Hip abduction    Hip adduction    Hip internal rotation    Hip external rotation    Knee flexion    Knee extension    Ankle dorsiflexion    Ankle plantarflexion    Ankle inversion    Ankle eversion    (Blank rows = not tested)  BED MOBILITY:  Not tested Pt reports difficulty getting out of the bed due to back pain and has increased difficulty getting out on the R side   TRANSFERS: Sit to stand: SBA  Assistive device utilized: None     Stand to sit: SBA  Assistive device utilized: None      RAMP:  Not tested  CURB:  Not tested  STAIRS: Not tested GAIT: Gait pattern: step through pattern, decreased step length- Right, decreased stance time- Left, decreased stride length, decreased ankle dorsiflexion- Right, knee flexed in stance- Right, knee flexed in stance- Left, lateral hip instability, lateral lean- Right, decreased trunk rotation, trunk flexed, and poor foot clearance- Right Distance walked: Various clinic distances  Assistive device utilized: Environmental consultant - 4 wheeled Level of assistance: SBA Comments: Pt frequently catching R foot on floor w/delayed stepping strategy to correct    FUNCTIONAL TESTS:    [facility] PT Assessment - 10/27/23 1039       Transfers   Five time sit to stand comments  11.78s   no UE support, weight shift to R     Ambulation/Gait   Gait velocity 32.8' over 12.84s = 2.55 ft/s w/rollator   SBA, increased lateral lean to R     Balance   Balance Assessed Yes      Standardized Balance Assessment   Standardized Balance Assessment Mini-BESTest      Mini-BESTest   Sit To Stand Normal: Comes to stand without use of hands and stabilizes independently.    Rise to Toes Moderate: Heels up, but not full range (smaller than when holding hands), OR noticeable instability for 3 s.   Lateral shift to R, lack of full range   Stand on one leg (left) Moderate: < 20 s   1.22s   Stand on one leg (right) Moderate: < 20 s   10.75s   Stand on one leg - lowest score 1                                                                                                                                       TREATMENT:   Self-care/home management  Educated pt on different balance strategies (reactive, reacting, etc.) and importance that posture has on ability to weight shift laterally and perform balance strategies.  Discussed obtaining referrals for SLP and OT rather than do screens next week, which pt in agreement with.   PATIENT EDUCATION: Education details: PT POC, eval findings, see self-care above  Person educated: Patient Education method: Medical illustrator Education comprehension: verbalized understanding, returned demonstration, and needs further education  HOME EXERCISE PROGRAM: From previous POC:  Standing PWR moves Multi-directional stepping sheet  Pt also has a stretching program he has at home.    Access Code: [email or web address]  GOALS: Goals reviewed with patient? Yes  SHORT TERM GOALS: Target date: 11/24/2023   MiniBest to be completed and STG/LTG updated  Baseline: Goal status: INITIAL  2.  Pt will improve gait velocity to at least 2.7 ft/s w/LRAD for  improved gait efficiency and independence   Baseline: 2.55 ft/s w/rollator  Goal status: INITIAL  3.  Pt will verbalize and demonstrate understanding of proper bed mobility techniques for improved independence and efficiency w/bed mobility at home  Baseline:  Goal status: INITIAL   LONG TERM GOALS: Target date: 12/22/2023   MiniBest goal  Baseline:  Goal status: INITIAL  2.  Pt will improve gait velocity to at least 3.0 ft/s w/LRAD for improved gait efficiency and independence  Baseline: 2.55 ft/s w/rollator  Goal status: INITIAL   ASSESSMENT:  CLINICAL IMPRESSION: Patient is a 78 year old male referred to Neuro O[facility] for recurrent falls. Pt's PMH is significant for: Rt TKA 01/11/23 with subsequent acute Lt CVA affecting Rt side, PD, Anxiety, HTN, HLD, multiple back surgeries/fusion neck and lumbar spine,  NSTEMI, 01/2020, hx of multiple syncopal episodes, CVA in 2017. The following deficits were present during the exam: impaired sensation, postural deficits, decreased functional strength, bradykinesia, thoracolumbar rigidity, R hemiparesis and improper gait kinematics. Based on recent falls, decreased lateral weight shifting, PD and R hemiparesis, pt is an incr risk for falls. Pt would benefit from skilled PT to address these impairments and functional limitations to maximize functional mobility independence.    OBJECTIVE IMPAIRMENTS: Abnormal gait, decreased activity tolerance, decreased balance, decreased coordination, decreased knowledge of use of DME, decreased mobility, difficulty walking, decreased strength, increased fascial restrictions, increased muscle spasms, impaired sensation, improper body mechanics, postural dysfunction, and pain  ACTIVITY LIMITATIONS: carrying, lifting, bending, standing, squatting, stairs, transfers, bed mobility, reach over head, locomotion level, and caring for others  PARTICIPATION LIMITATIONS: meal prep, cleaning, laundry, interpersonal  relationship, shopping, community activity, and yard work  PERSONAL FACTORS: Fitness, Past/current experiences, and 1-2 comorbidities: CVA and PD are also affecting patient's functional outcome.   REHAB POTENTIAL: Good  CLINICAL DECISION MAKING: Evolving/moderate complexity  EVALUATION COMPLEXITY: Moderate  PLAN:  PT FREQUENCY: 2x/week  PT DURATION: 8 weeks  PLANNED INTERVENTIONS: [id number]- PT Re-evaluation, 97750- Physical Performance Testing, 97110-Therapeutic exercises, 97530- Therapeutic activity, 97112- Neuromuscular re-education, 97535- Self Care, [id number]- Manual therapy, [phone]- Gait training, [phone]- Aquatic Therapy, [phone]- Electrical stimulation (manual), [phone] (1-2 muscles), [phone] (3+ muscles)- Dry Needling, Patient/Family education, Balance training, Stair training, Joint mobilization, Spinal mobilization, Vestibular training, and DME instructions  PLAN FOR NEXT SESSION: Finish MiniBest and update goal. Work on lateral weight shifting, functional strength of LLE, postural control. Review and update HEP prn. Bed mobility (gets out on R side)   Adeola Dennen E Adana Marik, PT, DPT 10/27/2023, 11:07 AM

## 2023-11-01 ENCOUNTER — Other Ambulatory Visit: Payer: Self-pay | Admitting: Neurology

## 2023-11-01 DIAGNOSIS — G20A1 Parkinson's disease without dyskinesia, without mention of fluctuations: Secondary | ICD-10-CM

## 2023-11-02 ENCOUNTER — Telehealth: Payer: Self-pay | Admitting: Physical Therapy

## 2023-11-02 ENCOUNTER — Ambulatory Visit: Admitting: Occupational Therapy

## 2023-11-02 ENCOUNTER — Encounter: Payer: Self-pay | Admitting: Physical Therapy

## 2023-11-02 ENCOUNTER — Other Ambulatory Visit: Payer: Self-pay

## 2023-11-02 ENCOUNTER — Encounter: Payer: Self-pay | Admitting: Occupational Therapy

## 2023-11-02 ENCOUNTER — Ambulatory Visit: Admitting: Physical Therapy

## 2023-11-02 VITALS — BP 108/61 | HR 76

## 2023-11-02 DIAGNOSIS — Z9181 History of falling: Secondary | ICD-10-CM

## 2023-11-02 DIAGNOSIS — M6281 Muscle weakness (generalized): Secondary | ICD-10-CM

## 2023-11-02 DIAGNOSIS — R2681 Unsteadiness on feet: Secondary | ICD-10-CM

## 2023-11-02 DIAGNOSIS — R293 Abnormal posture: Secondary | ICD-10-CM

## 2023-11-02 DIAGNOSIS — Z7409 Other reduced mobility: Secondary | ICD-10-CM

## 2023-11-02 DIAGNOSIS — G20A1 Parkinson's disease without dyskinesia, without mention of fluctuations: Secondary | ICD-10-CM

## 2023-11-02 NOTE — Telephone Encounter (Signed)
 Called pt's PCP office to further request orders for OT/ST as they had not been faxed over or put in the system yet. Had to leave message with Dr. Orlando nurse.  Sheffield Senate, PT, DPT 11/02/23 11:21 AM    [facility] [address] [place], [place]  [address] Phone:  [phone] Fax:  [phone]

## 2023-11-02 NOTE — Therapy (Signed)
 OUTPATIENT PHYSICAL THERAPY NEURO TREATMENT   Patient Name: Joshua [name] MRN: [id number] DOB:02/01/1945, 78 y.o., male Today's Date: 11/02/2023   PCP: Loreli Elsie JONETTA Mickey., MD REFERRING PROVIDER: Loreli Elsie JONETTA Mickey., MD  END OF SESSION:  PT End of Session - 11/02/23 1019     Visit Number 2    Number of Visits 17   Plus eval   Date for Recertification  01/05/24    Authorization Type [employer]    PT Start Time 1017    PT Stop Time 1057    PT Time Calculation (min) 40 min    Equipment Utilized During Treatment Gait belt    Activity Tolerance Patient tolerated treatment well    Behavior During Therapy [facility] for tasks assessed/performed          Past Medical History:  Diagnosis Date   Allergic rhinitis    Anxiety    from chronic pain from surgery- on Cymbalta    Arthritis    Asthma    Barrett's esophagus 03/29/2014   Carotid artery disease    Carotid Doppler normal August, 2007   Coronary artery disease    Diverticulosis    Dyslipidemia    GERD (gastroesophageal reflux disease)    History of loop recorder    has since 04/04/15   HTN (hypertension)    takes Metoprolol  for PVC control   Hx of colonic polyps    adenomatous   IFG (impaired fasting glucose)    Myocardial infarction (HCC)    mild   Neuromuscular disorder (HCC)    parkinson's tremors in hands   Palpitations    Benign PVCs   Parkinson disease (HCC)    Pneumonia    Prostate cancer (HCC)    RBBB (right bundle branch block)    rate related   Shingles    Stroke [facility]) 2017   January 01, 2023,right hand weakness,   TIA (transient ischemic attack) 02/2015   Per pt, had 2 strokes   Vertigo    Past Surgical History:  Procedure Laterality Date   BACK SURGERY  2002,2009   x 6   CHOLECYSTECTOMY  11/30/2012   with IOC   COLONOSCOPY     ELECTROPHYSIOLOGIC STUDY N/A 09/26/2015   Procedure: V Tach Ablation (PVC);  Surgeon: Will Gladis Norton, MD;  Location: [facility] INVASIVE CV LAB;  Service:  Cardiovascular;  Laterality: N/A;   EP IMPLANTABLE DEVICE N/A 04/04/2015   Procedure: Loop Recorder Insertion;  Surgeon: Will Gladis Norton, MD;  Location: [facility] INVASIVE CV LAB;  Service: Cardiovascular;  Laterality: N/A;   HERNIA REPAIR     laprascopic   KNEE SURGERY     DECEMBER 2017,LEFT KNEE SCOPED   LEFT HEART CATH AND CORONARY ANGIOGRAPHY N/A 02/05/2021   Procedure: LEFT HEART CATH AND CORONARY ANGIOGRAPHY;  Surgeon: Darron Deatrice LABOR, MD;  Location: [facility] INVASIVE CV LAB;  Service: Cardiovascular;  Laterality: N/A;   NECK SURGERY  2002   POLYPECTOMY     ROTATOR CUFF REPAIR Left    TEE WITHOUT CARDIOVERSION N/A 04/04/2015   Procedure: TRANSESOPHAGEAL ECHOCARDIOGRAM (TEE);  Surgeon: Redell GORMAN Shallow, MD;  Location: [facility] ENDOSCOPY;  Service: Cardiovascular;  Laterality: N/A;   TOTAL KNEE ARTHROPLASTY Left 09/28/2022   Procedure: TOTAL KNEE ARTHROPLASTY;  Surgeon: Edna Toribio LABOR, MD;  Location: [facility] ORS;  Service: Orthopedics;  Laterality: Left;   TOTAL KNEE ARTHROPLASTY Right 01/11/2023   Procedure: TOTAL KNEE ARTHROPLASTY;  Surgeon: Edna Toribio LABOR, MD;  Location: [facility] ORS;  Service: Orthopedics;  Laterality: Right;   TRIGGER FINGER RELEASE Left 12/28/2019   Procedure: RELEASE TRIGGER FINGER/A-1 PULLEY THUMB, MIDDLE AND RING;  Surgeon: Murrell Kuba, MD;  Location: [facility];  Service: Orthopedics;  Laterality: Left;  FAB   UPPER GASTROINTESTINAL ENDOSCOPY     V Tach ablation  09/26/2015   Patient Active Problem List   Diagnosis Date Noted   CVA (cerebral vascular accident) (HCC) 01/15/2023   Primary osteoarthritis of right knee 01/11/2023   Localized osteoarthritis of right knee 01/11/2023   Primary osteoarthritis of left knee 09/28/2022   SBO (small bowel obstruction) (HCC) 05/29/2022   Chest pain 05/26/2022   Epigastric abdominal pain 05/26/2022   Coronary artery disease involving native coronary artery of native heart without angina pectoris 05/28/2021    History of myocardial infarction 02/05/2021   Parkinson's disease (HCC)    Malignant neoplasm of prostate (HCC) 11/01/2017   Essential tremor 12/06/2015   PVC (premature ventricular contraction) 09/26/2015   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) 04/07/2015   Acute CVA (cerebrovascular accident) (HCC) 03/06/2015   Stroke (cerebrum) (HCC) 03/05/2015   Cerebral infarction (HCC) 03/05/2015   Weakness of right upper extremity    Tremor    Ejection fraction    Vertigo    Sleep apnea    Palpitations    Hyperlipidemia with target LDL less than 70    GERD (gastroesophageal reflux disease)    HTN (hypertension)    Chest pain    RBBB (right bundle branch block)    Ejection fraction    Carotid artery disease (HCC)     ONSET DATE: 10/15/2023 (referral)   REFERRING DIAG: R29.6 (ICD-10-CM) - Recurrent falls  THERAPY DIAG:  Muscle weakness (generalized)  Unsteadiness on feet  History of falling  Abnormal posture  Rationale for Evaluation and Treatment: Rehabilitation  SUBJECTIVE:                                                                                                                                                                                             SUBJECTIVE STATEMENT:  Had a good birthday over the weekend. No new falls. At home does not have any AD. Been having trouble with the balance.   Pt accompanied by: self  PERTINENT HISTORY: Rt TKA 01/11/23 with subsequent acute Lt CVA affecting Rt side, PD, Anxiety, HTN, HLD, multiple back surgeries/fusion neck and lumbar spine,  NSTEMI, 01/2020, hx of multiple syncopal episodes, CVA in 2017  PAIN:  Are you having pain? Yes: NPRS scale: 7/10 Pain location: low back (L2) Pain description: Achy  Aggravating factors: Too much movement  Relieving factors:  Ablation, medications   PRECAUTIONS: Fall  RED FLAGS: None   WEIGHT BEARING RESTRICTIONS: No  FALLS: Has patient fallen in last 6 months? Yes.  Number of falls 3  LIVING ENVIRONMENT: Lives with: lives with their spouse Lives in: House/apartment Stairs: Yes: External: 1 steps; none Has following equipment at home: Single point cane, Walker - 2 wheeled, Environmental consultant - 4 wheeled, shower chair, Grab bars, and [employer] and transport chair  PLOF: Independent  PATIENT GOALS: Just to work on my balance   OBJECTIVE:  Note: Objective measures were completed at Evaluation unless otherwise noted.  DIAGNOSTIC FINDINGS: MRI of Lumbar spine from 05/15/23  IMPRESSION: 1. Operative changes of posterior fusion from L3 through S1. Interbody fusion at these levels. Prior laminotomies at L3-L4 and L5-S1. 2. Moderate foraminal stenoses on the right at L3-L4 and bilaterally at L4-L5. 3. Mild canal stenosis at L2-L3 and L3-L4.  CT of head from 09/10/23 CT CERVICAL SPINE FINDINGS   Alignment: Mild anterolisthesis of C2 on C3 and C4 on C5 is degenerate etiology and unchanged from the prior. No traumatic malalignment.   Skull base and vertebrae: No evidence of acute fracture. Vertebral body heights are maintained. Solid C5-C6 ACDF.   Soft tissues and spinal canal: No prevertebral fluid or swelling. No visible canal hematoma.   Disc levels: Similar multilevel degenerative change.   Upper chest: Visualized lung apices are clear.   IMPRESSION: No evidence of acute abnormality intracranially or in the cervical spine.  COGNITION: Overall cognitive status: Within functional limits for tasks assessed   SENSATION: Pt reports bilateral numbness/tingling in distal BLEs from feet to knees    POSTURE: rounded shoulders, forward head, increased thoracic kyphosis, posterior pelvic tilt, and weight shift right  LOWER EXTREMITY ROM:     Active  Right Eval Left Eval  Hip flexion    Hip extension    Hip abduction    Hip adduction    Hip internal rotation    Hip external rotation    Knee flexion    Knee extension    Ankle dorsiflexion     Ankle plantarflexion    Ankle inversion    Ankle eversion     (Blank rows = not tested)  LOWER EXTREMITY MMT:    MMT Right Eval Left Eval  Hip flexion    Hip extension    Hip abduction    Hip adduction    Hip internal rotation    Hip external rotation    Knee flexion    Knee extension    Ankle dorsiflexion    Ankle plantarflexion    Ankle inversion    Ankle eversion    (Blank rows = not tested)  BED MOBILITY:  Not tested Pt reports difficulty getting out of the bed due to back pain and has increased difficulty getting out on the R side   TRANSFERS: Sit to stand: SBA  Assistive device utilized: None     Stand to sit: SBA  Assistive device utilized: None      RAMP:  Not tested  CURB:  Not tested  STAIRS: Not tested GAIT: Gait pattern: step through pattern, decreased step length- Right, decreased stance time- Left, decreased stride length, decreased ankle dorsiflexion- Right, knee flexed in stance- Right, knee flexed in stance- Left, lateral hip instability, lateral lean- Right, decreased trunk rotation, trunk flexed, and poor foot clearance- Right Distance walked: Various clinic distances  Assistive device utilized: Walker - 4 wheeled Level of assistance: SBA Comments: Pt frequently catching  R foot on floor w/delayed stepping strategy to correct                                                                                                                                 TREATMENT:    Therapeutic Activity: Vitals:   11/02/23 1028 11/02/23 1030  BP: 107/62 108/61  Pulse: 69 76   Seated, Standing  Pt reports sometimes will get lightheaded in standing and was a little lightheaded this morning, but not feeling lightheaded right now. Pt reports he did not have enough water  this morning. Pt's BP stable in sitting and standing and reports he took his Midodrine  this morning (takes it 2x a day)   [facility] PT Assessment - 11/02/23 1028       Standardized Balance  Assessment   Standardized Balance Assessment Timed Up and Go Test      Mini-BESTest   Sit To Stand Normal: Comes to stand without use of hands and stabilizes independently.    Rise to Toes Moderate: Heels up, but not full range (smaller than when holding hands), OR noticeable instability for 3 s.   Lateral shift to R, lack of full range   Stand on one leg (left) Moderate: < 20 s   1.22s   Stand on one leg (right) Moderate: < 20 s   10.75s   Stand on one leg - lowest score 1    Compensatory Stepping Correction - Forward Normal: Recovers independently with a single, large step (second realignement is allowed).    Compensatory Stepping Correction - Backward Normal: Recovers independently with a single, large step    Compensatory Stepping Correction - Left Lateral Moderate: Several steps to recover equilibrium   2 steps   Compensatory Stepping Correction - Right Lateral Normal: Recovers independently with 1 step (crossover or lateral OK)    Stepping Corredtion Lateral - lowest score 1    Stance - Feet together, eyes open, firm surface  Normal: 30s    Stance - Feet together, eyes closed, foam surface  Moderate: < 30s   3 seconds first attempt, 30 seconds 2nd attempt with mod postural sway   Incline - Eyes Closed Normal: Stands independently 30s and aligns with gravity    Change in Gait Speed Moderate: Unable to change walking speed or signs of imbalance    Walk with head turns - Horizontal Moderate: performs head turns with reduction in gait speed.    Walk with pivot turns Moderate:Turns with feet close SLOW (>4 steps) with good balance.    Step over obstacles Moderate: Steps over box but touches box OR displays cautious behavior by slowing gait.    Timed UP & GO with Dual Task Normal: No noticeable change in sitting, standing or walking while backward counting when compared to TUG without    Mini-BEST total score 20      Timed Up and Go Test   Normal TUG (seconds) 10.3  Cognitive TUG  (seconds) 10.4   starting at 77 counting backwards by 3          NMR: Pt performs PWR! Moves in standing position 2 x 10 reps - performed with chair in front of pt as needed for balance    PWR! Up for improved posture  PWR! Rock for improved weight shifting - cued to look up at hands and stand tall through stance leg   PWR! Twist for improved trunk rotation - cued to reset in the middle with tall posture before twisting   PWR! Step for improved step initiation - pt with decr foot clearance when trying to pick up foot off the floor   Cues provided for larger amplitude movements and techniques   Pt reporting 4-5/10 RPE when performing, educated on trying to work up towards a 6-7/10 Pt had this prior as part of his HEP, so reviewed with pt, pt reports that he already has a handout and does not need a new one    PATIENT EDUCATION: Education details: Results of miniBEST, reviewed standing PWR moves for HEP  Person educated: Patient Education method: Explanation, Demonstration, and Verbal cues Education comprehension: verbalized understanding, returned demonstration, and needs further education  HOME EXERCISE PROGRAM: From previous POC:  Standing PWR moves Multi-directional stepping sheet  Pt also has a stretching program he has at home.    Access Code: [email or web address]  GOALS: Goals reviewed with patient? Yes  SHORT TERM GOALS: Target date: 11/24/2023   Pt will improve miniBEST to at least a 22/28 in order to demo decr fall risk.  Baseline:20/28 Goal status: INITIAL  2.  Pt will improve gait velocity to at least 2.7 ft/s w/LRAD for improved gait efficiency and independence   Baseline: 2.55 ft/s w/rollator  Goal status: INITIAL  3.  Pt will verbalize and demonstrate understanding of proper bed mobility techniques for improved independence and efficiency w/bed mobility at home  Baseline:  Goal status: INITIAL   LONG TERM GOALS: Target date: 12/22/2023   Pt will improve  miniBEST to at least a 25/28 in order to demo decr fall risk. Baseline: 20/28 Goal status: INITIAL  2.  Pt will improve gait velocity to at least 3.0 ft/s w/LRAD for improved gait efficiency and independence  Baseline: 2.55 ft/s w/rollator  Goal status: INITIAL   ASSESSMENT:  CLINICAL IMPRESSION: Assessed BP in seated/standing as pt reporting earlier this morning was feeling a little lightheaded. Pt's BP stable (see above) with seated and standing and pt not symptomatic during session. Assessed miniBEST with pt scoring a 20/28, indicating an incr risk for falls. Pt more challenged by dynamic gait tasks, vestibular input for balance, and SLS tasks. STG/LTG updated as appropriately. Remainder of session focused on beginning to review HEP including standing PWR moves. Re-issued to HEP (with pt already having a handout) and for pt to perform with a chair in front of him as needed for balance. Will continue per POC.    OBJECTIVE IMPAIRMENTS: Abnormal gait, decreased activity tolerance, decreased balance, decreased coordination, decreased knowledge of use of DME, decreased mobility, difficulty walking, decreased strength, increased fascial restrictions, increased muscle spasms, impaired sensation, improper body mechanics, postural dysfunction, and pain  ACTIVITY LIMITATIONS: carrying, lifting, bending, standing, squatting, stairs, transfers, bed mobility, reach over head, locomotion level, and caring for others  PARTICIPATION LIMITATIONS: meal prep, cleaning, laundry, interpersonal relationship, shopping, community activity, and yard work  PERSONAL FACTORS: Fitness, Past/current experiences, and 1-2 comorbidities: CVA and PD are also affecting patient's  functional outcome.   REHAB POTENTIAL: Good  CLINICAL DECISION MAKING: Evolving/moderate complexity  EVALUATION COMPLEXITY: Moderate  PLAN:  PT FREQUENCY: 2x/week  PT DURATION: 8 weeks  PLANNED INTERVENTIONS: [id number]- PT Re-evaluation,  97750- Physical Performance Testing, 97110-Therapeutic exercises, 97530- Therapeutic activity, [id number]- Neuromuscular re-education, 97535- Self Care, [id number]- Manual therapy, [id number]- Gait training, [phone]- Aquatic Therapy, [phone]- Electrical stimulation (manual), [phone] (1-2 muscles), [id number] (3+ muscles)- Dry Needling, Patient/Family education, Balance training, Stair training, Joint mobilization, Spinal mobilization, Vestibular training, and DME instructions  PLAN FOR NEXT SESSION: Work on lateral weight shifting, functional strength of LLE, postural control. SLS stability. Bed mobility (gets out on R side) Review remainder of HEP (just reviewed standing PWR moves) and update as appropriate.    Sheffield LOISE Senate, PT, DPT 11/02/2023, 11:11 AM

## 2023-11-04 ENCOUNTER — Ambulatory Visit: Admitting: Speech Pathology

## 2023-11-04 ENCOUNTER — Ambulatory Visit: Admitting: Occupational Therapy

## 2023-11-04 ENCOUNTER — Ambulatory Visit: Admitting: Physical Therapy

## 2023-11-05 ENCOUNTER — Ambulatory Visit: Admitting: Physical Therapy

## 2023-11-05 VITALS — BP 121/69 | HR 75

## 2023-11-05 DIAGNOSIS — M5459 Other low back pain: Secondary | ICD-10-CM

## 2023-11-05 DIAGNOSIS — R278 Other lack of coordination: Secondary | ICD-10-CM

## 2023-11-05 DIAGNOSIS — M6281 Muscle weakness (generalized): Secondary | ICD-10-CM | POA: Diagnosis not present

## 2023-11-05 DIAGNOSIS — R29818 Other symptoms and signs involving the nervous system: Secondary | ICD-10-CM

## 2023-11-05 DIAGNOSIS — R293 Abnormal posture: Secondary | ICD-10-CM

## 2023-11-05 NOTE — Therapy (Signed)
 OUTPATIENT PHYSICAL THERAPY NEURO TREATMENT   Patient Name: Joshua [name] MRN: [id number] DOB:1945/06/22, 78 y.o., male Today's Date: 11/05/2023   PCP: Loreli Elsie JONETTA Mickey., MD REFERRING PROVIDER: Loreli Elsie JONETTA Mickey., MD  END OF SESSION:  PT End of Session - 11/05/23 1105     Visit Number 3    Number of Visits 17   Plus eval   Date for Recertification  01/05/24    Authorization Type [employer]    PT Start Time 1103    PT Stop Time 1145    PT Time Calculation (min) 42 min    Equipment Utilized During Treatment Gait belt    Activity Tolerance Patient tolerated treatment well    Behavior During Therapy [facility] for tasks assessed/performed          Past Medical History:  Diagnosis Date   Allergic rhinitis    Anxiety    from chronic pain from surgery- on Cymbalta    Arthritis    Asthma    Barrett's esophagus 03/29/2014   Carotid artery disease    Carotid Doppler normal August, 2007   Coronary artery disease    Diverticulosis    Dyslipidemia    GERD (gastroesophageal reflux disease)    History of loop recorder    has since 04/04/15   HTN (hypertension)    takes Metoprolol  for PVC control   Hx of colonic polyps    adenomatous   IFG (impaired fasting glucose)    Myocardial infarction (HCC)    mild   Neuromuscular disorder (HCC)    parkinson's tremors in hands   Palpitations    Benign PVCs   Parkinson disease (HCC)    Pneumonia    Prostate cancer (HCC)    RBBB (right bundle branch block)    rate related   Shingles    Stroke [facility]) 2017   January 01, 2023,right hand weakness,   TIA (transient ischemic attack) 02/2015   Per pt, had 2 strokes   Vertigo    Past Surgical History:  Procedure Laterality Date   BACK SURGERY  2002,2009   x 6   CHOLECYSTECTOMY  11/30/2012   with IOC   COLONOSCOPY     ELECTROPHYSIOLOGIC STUDY N/A 09/26/2015   Procedure: V Tach Ablation (PVC);  Surgeon: Will Gladis Norton, MD;  Location: [facility] INVASIVE CV [facility];  Service:  Cardiovascular;  Laterality: N/A;   EP IMPLANTABLE DEVICE N/A 04/04/2015   Procedure: Loop Recorder Insertion;  Surgeon: Will Gladis Norton, MD;  Location: [facility] INVAS[facility] CV [facility];  Service: Cardiovascular;  Laterality: N/A;   HERNIA REPAIR     laprascopic   KNEE SURGERY     DECEMBER 2017,LEFT KNEE SCOPED   LEFT HEART CATH AND CORONARY ANGIOGRAPHY N/A 02/05/2021   Procedure: LEFT HEART CATH AND CORONARY ANGIOGRAPHY;  Surgeon: Darron Deatrice LABOR, MD;  Location: [facility] INVASIVE CV LAB;  Service: Cardiovascular;  Laterality: N/A;   NECK SURGERY  2002   POLYPECTOMY     ROTATOR CUFF REPAIR Left    TEE WITHOUT CARDIOVERSION N/A 04/04/2015   Procedure: TRANSESOPHAGEAL ECHOCARDIOGRAM (TEE);  Surgeon: Redell GORMAN Shallow, MD;  Location: [facility] ENDOSCOPY;  Service: Cardiovascular;  Laterality: N/A;   TOTAL KNEE ARTHROPLASTY Left 09/28/2022   Procedure: TOTAL KNEE ARTHROPLASTY;  Surgeon: Edna Toribio LABOR, MD;  Location: [facility] ORS;  Service: Orthopedics;  Laterality: Left;   TOTAL KNEE ARTHROPLASTY Right 01/11/2023   Procedure: TOTAL KNEE ARTHROPLASTY;  Surgeon: Edna Toribio LABOR, MD;  Location: [facility] ORS;  Service: Orthopedics;  Laterality: Right;   TRIGGER FINGER RELEASE Left 12/28/2019   Procedure: RELEASE TRIGGER FINGER/A-1 PULLEY THUMB, MIDDLE AND RING;  Surgeon: Murrell Kuba, MD;  Location: [facility];  Service: Orthopedics;  Laterality: Left;  FAB   UPPER GASTROINTESTINAL ENDOSCOPY     V Tach ablation  09/26/2015   Patient Active Problem List   Diagnosis Date Noted   CVA (cerebral vascular accident) (HCC) 01/15/2023   Primary osteoarthritis of right knee 01/11/2023   Localized osteoarthritis of right knee 01/11/2023   Primary osteoarthritis of left knee 09/28/2022   SBO (small bowel obstruction) (HCC) 05/29/2022   Chest pain 05/26/2022   Epigastric abdominal pain 05/26/2022   Coronary artery disease involving native coronary artery of native heart without angina pectoris 05/28/2021    History of myocardial infarction 02/05/2021   Parkinson's disease (HCC)    Malignant neoplasm of prostate (HCC) 11/01/2017   Essential tremor 12/06/2015   PVC (premature ventricular contraction) 09/26/2015   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) 04/07/2015   Acute CVA (cerebrovascular accident) (HCC) 03/06/2015   Stroke (cerebrum) (HCC) 03/05/2015   Cerebral infarction (HCC) 03/05/2015   Weakness of right upper extremity    Tremor    Ejection fraction    Vertigo    Sleep apnea    Palpitations    Hyperlipidemia with target LDL less than 70    GERD (gastroesophageal reflux disease)    HTN (hypertension)    Chest pain    RBBB (right bundle branch block)    Ejection fraction    Carotid artery disease (HCC)     ONSET DATE: 10/15/2023 (referral)   REFERRING DIAG: R29.6 (ICD-10-CM) - Recurrent falls  THERAPY DIAG:  Muscle weakness (generalized)  Other lack of coordination  Other symptoms and signs involving the nervous system  Other low back pain  Abnormal posture  Rationale for Evaluation and Treatment: Rehabilitation  SUBJECTIVE:                                                                                                                                                                                             SUBJECTIVE STATEMENT:  Pt presents w/rollator, states he is doing well. Has been working on his HEP at home. No falls, back pain is about the same today. Having some dizziness today, rating it as a 2/10.   Pt accompanied by: self  PERTINENT HISTORY: Rt TKA 01/11/23 with subsequent acute Lt CVA affecting Rt side, PD, Anxiety, HTN, HLD, multiple back surgeries/fusion neck and lumbar spine,  NSTEMI, 01/2020, hx of multiple syncopal episodes, CVA in 2017  PAIN:  Are you having  pain? Yes: NPRS scale: 7/10 Pain location: low back (L2) Pain description: Achy  Aggravating factors: Too much movement  Relieving factors: Ablation, medications    PRECAUTIONS: Fall  RED FLAGS: None   WEIGHT BEARING RESTRICTIONS: No  FALLS: Has patient fallen in last 6 months? Yes. Number of falls 3  LIVING ENVIRONMENT: Lives with: lives with their spouse Lives in: House/apartment Stairs: Yes: External: 1 steps; none Has following equipment at home: Single point cane, Walker - 2 wheeled, Environmental consultant - 4 wheeled, shower chair, Grab bars, and [employer] and transport chair  PLOF: Independent  PATIENT GOALS: Just to work on my balance   OBJECTIVE:  Note: Objective measures were completed at Evaluation unless otherwise noted.  DIAGNOSTIC FINDINGS: MRI of Lumbar spine from 05/15/23  IMPRESSION: 1. Operative changes of posterior fusion from L3 through S1. Interbody fusion at these levels. Prior laminotomies at L3-L4 and L5-S1. 2. Moderate foraminal stenoses on the right at L3-L4 and bilaterally at L4-L5. 3. Mild canal stenosis at L2-L3 and L3-L4.  CT of head from 09/10/23 CT CERVICAL SPINE FINDINGS   Alignment: Mild anterolisthesis of C2 on C3 and C4 on C5 is degenerate etiology and unchanged from the prior. No traumatic malalignment.   Skull base and vertebrae: No evidence of acute fracture. Vertebral body heights are maintained. Solid C5-C6 ACDF.   Soft tissues and spinal canal: No prevertebral fluid or swelling. No visible canal hematoma.   Disc levels: Similar multilevel degenerative change.   Upper chest: Visualized lung apices are clear.   IMPRESSION: No evidence of acute abnormality intracranially or in the cervical spine.  COGNITION: Overall cognitive status: Within functional limits for tasks assessed   SENSATION: Pt reports bilateral numbness/tingling in distal BLEs from feet to knees    POSTURE: rounded shoulders, forward head, increased thoracic kyphosis, posterior pelvic tilt, and weight shift right  LOWER EXTREMITY ROM:     Active  Right Eval Left Eval  Hip flexion    Hip extension    Hip abduction     Hip adduction    Hip internal rotation    Hip external rotation    Knee flexion    Knee extension    Ankle dorsiflexion    Ankle plantarflexion    Ankle inversion    Ankle eversion     (Blank rows = not tested)  LOWER EXTREMITY MMT:    MMT Right Eval Left Eval  Hip flexion    Hip extension    Hip abduction    Hip adduction    Hip internal rotation    Hip external rotation    Knee flexion    Knee extension    Ankle dorsiflexion    Ankle plantarflexion    Ankle inversion    Ankle eversion    (Blank rows = not tested)  BED MOBILITY:  Not tested Pt reports difficulty getting out of the bed due to back pain and has increased difficulty getting out on the R side   TRANSFERS: Sit to stand: SBA  Assistive device utilized: None     Stand to sit: SBA  Assistive device utilized: None      RAMP:  Not tested  CURB:  Not tested  STAIRS: Not tested GAIT: Gait pattern: step through pattern, decreased step length- Right, decreased stance time- Left, decreased stride length, decreased ankle dorsiflexion- Right, knee flexed in stance- Right, knee flexed in stance- Left, lateral hip instability, lateral lean- Right, decreased trunk rotation, trunk flexed, and poor foot clearance-  Right Distance walked: Various clinic distances  Assistive device utilized: Walker - 4 wheeled and None Level of assistance: SBA Comments: No LOB noted this date, but pt does maintain narrow BOS    VITALS  Vitals:   11/05/23 1109  BP: 121/69  Pulse: 75                                                                                                                                 TREATMENT:    Self-care  Assessed vitals in LUE while seated (see above) and WNL   Ther Act  SciFit multi-peaks level 8.0 for 8 minutes using BUE/BLEs for neural priming for reciprocal movement, dynamic cardiovascular warmup and increased amplitude of stepping. RPE of 9/10 following activity.   NMR  In // bars for  improved lateral weight shifting, step clearance and postural control:  On Rockerboard in L/R direction:  Standing w/no UE support and using mirror for visual biofeedback on body position x4 minutes. Pt w/significant lateral lean to R, but was able to self-correct using hip strategy. Min tactile cues applied to shoulders as pt has elevated L shoulder.  Progressed to PWR up (provided by [facility] certified therapist) x12 reps w/CGA. Increased instability to R side w/fatigue.  On Rockerboard in A/P direction  Alt retro step off board w/intermittent UE support, x10 reps per side. Mod verbal cues for large step w/RLE and to maintain hip-width BOS, as pt places LLE in midline or on top of RLE  Alt fwd step off board w/intermittent UE support, x10 reps per side. Pt frequently catching R foot on front of board and continued to place LLE in midline despite cues.   Lateral adv/retreat over 4 foam beam, x15 reps per side w/no UE support. Pt performed well w/LLE but assumed crouched position when performing on RLE.  Discussed importance of proper foot placement (wider BOS) and reducing distractions when ambulating, as pt able to elicit large step w/RLE if concentrating on it, but does not when he is distracted. Pt in agreement with this.    PATIENT EDUCATION: Education details: Continue HEP, limiting distractions w/gait, importance of proper foot placement  Person educated: Patient Education method: Explanation, Demonstration, and Verbal cues Education comprehension: verbalized understanding, returned demonstration, and needs further education  HOME EXERCISE PROGRAM: From previous POC:  Standing PWR moves Multi-directional stepping sheet  Pt also has a stretching program he has at home.    Access Code: [email or web address]  GOALS: Goals reviewed with patient? Yes  SHORT TERM GOALS: Target date: 11/24/2023   Pt will improve miniBEST to at least a 22/28 in order to demo decr fall risk.  Baseline:20/28 Goal  status: INITIAL  2.  Pt will improve gait velocity to at least 2.7 ft/s w/LRAD for improved gait efficiency and independence   Baseline: 2.55 ft/s w/rollator  Goal status: INITIAL  3.  Pt will verbalize and demonstrate understanding of proper bed mobility techniques for  improved independence and efficiency w/bed mobility at home  Baseline:  Goal status: INITIAL   LONG TERM GOALS: Target date: 12/22/2023   Pt will improve miniBEST to at least a 25/28 in order to demo decr fall risk. Baseline: 20/28 Goal status: INITIAL  2.  Pt will improve gait velocity to at least 3.0 ft/s w/LRAD for improved gait efficiency and independence  Baseline: 2.55 ft/s w/rollator  Goal status: INITIAL   ASSESSMENT:  CLINICAL IMPRESSION: Emphasis of skilled PT session on reciprocal coordination, lateral weight shifting, increased step clearance and education on proper BOS. Pt's BP WNL this date and pt reported reduced dizziness compared to previous session. Pt demonstrates narrow foot placement of LLE, resulting in pt catching his R foot on L heel. Pt able to elicit wider BOS w/cues, but if distracted, regressed to narrow BOS. Encouraged pt to reduce distractions when able and focus on LARGE and wide steps at all times. Will continue per POC.    OBJECTIVE IMPAIRMENTS: Abnormal gait, decreased activity tolerance, decreased balance, decreased coordination, decreased knowledge of use of DME, decreased mobility, difficulty walking, decreased strength, increased fascial restrictions, increased muscle spasms, impaired sensation, improper body mechanics, postural dysfunction, and pain  ACTIVITY LIMITATIONS: carrying, lifting, bending, standing, squatting, stairs, transfers, bed mobility, reach over head, locomotion level, and caring for others  PARTICIPATION LIMITATIONS: meal prep, cleaning, laundry, interpersonal relationship, shopping, community activity, and yard work  PERSONAL FACTORS: Fitness, Past/current  experiences, and 1-2 comorbidities: CVA and PD are also affecting patient's functional outcome.   REHAB POTENTIAL: Good  CLINICAL DECISION MAKING: Evolving/moderate complexity  EVALUATION COMPLEXITY: Moderate  PLAN:  PT FREQUENCY: 2x/week  PT DURATION: 8 weeks  PLANNED INTERVENTIONS: [id number]- PT Re-evaluation, 97750- Physical Performance Testing, 97110-Therapeutic exercises, 97530- Therapeutic activity, [id number]- Neuromuscular re-education, 97535- Self Care, [id number]- Manual therapy, [id number]- Gait training, [phone]- Aquatic Therapy, [id number]- Electrical stimulation (manual), [phone] (1-2 muscles), [id number] (3+ muscles)- Dry Needling, Patient/Family education, Balance training, Stair training, Joint mobilization, Spinal mobilization, Vestibular training, and DME instructions  PLAN FOR NEXT SESSION: Work on lateral weight shifting, functional strength of LLE, postural control. SLS stability. Bed mobility (gets out on R side) Review remainder of HEP (just reviewed standing PWR moves) and update as appropriate.    Marquiz Sotelo E Diesha Rostad, PT, DPT 11/05/2023, 11:56 AM

## 2023-11-09 ENCOUNTER — Encounter: Payer: Self-pay | Admitting: Occupational Therapy

## 2023-11-09 ENCOUNTER — Ambulatory Visit: Admitting: Physical Therapy

## 2023-11-09 ENCOUNTER — Other Ambulatory Visit: Payer: Self-pay

## 2023-11-09 ENCOUNTER — Ambulatory Visit: Admitting: Occupational Therapy

## 2023-11-09 VITALS — BP 101/65 | HR 71

## 2023-11-09 DIAGNOSIS — M5459 Other low back pain: Secondary | ICD-10-CM

## 2023-11-09 DIAGNOSIS — R278 Other lack of coordination: Secondary | ICD-10-CM

## 2023-11-09 DIAGNOSIS — R29818 Other symptoms and signs involving the nervous system: Secondary | ICD-10-CM

## 2023-11-09 DIAGNOSIS — R2681 Unsteadiness on feet: Secondary | ICD-10-CM

## 2023-11-09 DIAGNOSIS — M6281 Muscle weakness (generalized): Secondary | ICD-10-CM | POA: Diagnosis not present

## 2023-11-09 DIAGNOSIS — G250 Essential tremor: Secondary | ICD-10-CM

## 2023-11-09 DIAGNOSIS — R29898 Other symptoms and signs involving the musculoskeletal system: Secondary | ICD-10-CM

## 2023-11-09 DIAGNOSIS — R293 Abnormal posture: Secondary | ICD-10-CM

## 2023-11-09 NOTE — Therapy (Unsigned)
 OUTPATIENT OCCUPATIONAL THERAPY PARKINSON'S EVALUATION  Patient Name: Joshua [name] MRN: [id number] DOB:17-Nov-1945, 78 y.o., male Today's Date: 11/09/2023  PCP: Loreli Elsie JONETTA Mickey., MD  REFERRING PROVIDER: Evonnie Asberry RAMAN, DO  END OF SESSION:  OT End of Session - 11/09/23 1152     Visit Number 1    Number of Visits 17    Date for Recertification  01/21/24   currently only schedule until 11/26   Authorization Type [employer]    OT Start Time 1152    OT Stop Time 1232    OT Time Calculation (min) 40 min    Activity Tolerance Patient tolerated treatment well    Behavior During Therapy [facility] for tasks assessed/performed         Past Medical History:  Diagnosis Date   Allergic rhinitis    Anxiety    from chronic pain from surgery- on Cymbalta    Arthritis    Asthma    Barrett's esophagus 03/29/2014   Carotid artery disease    Carotid Doppler normal August, 2007   Coronary artery disease    Diverticulosis    Dyslipidemia    GERD (gastroesophageal reflux disease)    History of loop recorder    has since 04/04/15   HTN (hypertension)    takes Metoprolol  for PVC control   Hx of colonic polyps    adenomatous   IFG (impaired fasting glucose)    Myocardial infarction (HCC)    mild   Neuromuscular disorder (HCC)    parkinson's tremors in hands   Palpitations    Benign PVCs   Parkinson disease (HCC)    Pneumonia    Prostate cancer (HCC)    RBBB (right bundle branch block)    rate related   Shingles    Stroke [facility]) 2017   January 01, 2023,right hand weakness,   TIA (transient ischemic attack) 02/2015   Per pt, had 2 strokes   Vertigo    Past Surgical History:  Procedure Laterality Date   BACK SURGERY  2002,2009   x 6   CHOLECYSTECTOMY  11/30/2012   with IOC   COLONOSCOPY     ELECTROPHYSIOLOGIC STUDY N/A 09/26/2015   Procedure: V Tach Ablation (PVC);  Surgeon: Will Gladis Norton, MD;  Location: [facility] INV[facility]IVE [facility];  Service: Cardiovascular;   Laterality: N/A;   EP IMPLANTABLE DEVICE N/A 04/04/2015   Procedure: Loop Recorder Insertion;  Surgeon: Will Gladis Norton, MD;  Location: [facility];  Service: Cardiovascular;  Laterality: N/A;   HERNIA REPAIR     laprascopic   KNEE SURGERY     DECEMBER 2017,LEFT KNEE SCOPED   LEFT HEART CATH AND CORONARY ANGIOGRAPHY N/A 02/05/2021   Procedure: LEFT HEART CATH AND CORONARY ANGIOGRAPHY;  Surgeon: Darron Deatrice LABOR, MD;  Location: [facility]VAS[facility] CV LAB;  Service: Cardiovascular;  Laterality: N/A;   NECK SURGERY  2002   POLYPECTOMY     ROTATOR CUFF REPAIR Left    TEE WITHOUT CARDIOVERSION N/A 04/04/2015   Procedure: TRANSESOPHAGEAL ECHOCARDIOGRAM (TEE);  Surgeon: Redell RAMAN Shallow, MD;  Location: [facility] ENDOSCOPY;  Service: Cardiovascular;  Laterality: N/A;   TOTAL KNEE ARTHROPLASTY Left 09/28/2022   Procedure: TOTAL KNEE ARTHROPLASTY;  Surgeon: Edna Toribio LABOR, MD;  Location: [facility] ORS;  Service: Orthopedics;  Laterality: Left;   TOTAL KNEE ARTHROPLASTY Right 01/11/2023   Procedure: TOTAL KNEE ARTHROPLASTY;  Surgeon: Edna Toribio LABOR, MD;  Location: [facility] ORS;  Service: Orthopedics;  Laterality: Right;   TRIGGER FINGER RELEASE Left 12/28/2019  Procedure: RELEASE TRIGGER FINGER/A-1 PULLEY THUMB, MIDDLE AND RING;  Surgeon: Murrell Kuba, MD;  Location: [facility];  Service: Orthopedics;  Laterality: Left;  FAB   UPPER GASTROINTESTINAL ENDOSCOPY     V Tach ablation  09/26/2015   Patient Active Problem List   Diagnosis Date Noted   CVA (cerebral vascular accident) (HCC) 01/15/2023   Primary osteoarthritis of right knee 01/11/2023   Localized osteoarthritis of right knee 01/11/2023   Primary osteoarthritis of left knee 09/28/2022   SBO (small bowel obstruction) (HCC) 05/29/2022   Chest pain 05/26/2022   Epigastric abdominal pain 05/26/2022   Coronary artery disease involving native coronary artery of native heart without angina pectoris 05/28/2021   History of myocardial  infarction 02/05/2021   Parkinson's disease (HCC)    Malignant neoplasm of prostate (HCC) 11/01/2017   Essential tremor 12/06/2015   PVC (premature ventricular contraction) 09/26/2015   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) 04/07/2015   Acute CVA (cerebrovascular accident) (HCC) 03/06/2015   Stroke (cerebrum) (HCC) 03/05/2015   Cerebral infarction (HCC) 03/05/2015   Weakness of right upper extremity    Tremor    Ejection fraction    Vertigo    Sleep apnea    Palpitations    Hyperlipidemia with target LDL less than 70    GERD (gastroesophageal reflux disease)    HTN (hypertension)    Chest pain    RBBB (right bundle branch block)    Ejection fraction    Carotid artery disease (HCC)    ONSET DATE: 11/03/2023 (Date of referral)  REFERRING DIAG: G20.B1 (ICD-10-CM) - Parkinson's disease with dyskinesia, without mention of fluctuations   THERAPY DIAG:  Muscle weakness (generalized)  Other lack of coordination  Other symptoms and signs involving the nervous system  Other symptoms and signs involving the musculoskeletal system  Essential tremor  Rationale for Evaluation and Treatment: Rehabilitation  SUBJECTIVE:   SUBJECTIVE STATEMENT: He reports he is exercising constantly. Working on firefighter. Able to cut and eat food just fine. Tremor on L is worse. L 5th digit trigger finger using bandaid. Difficulty getting pressure on Dishes to wash them and Folding clothes. Has electric toothbrush now. Is able to tie shoes now.  Pt accompanied by: self  PERTINENT HISTORY: Rt TKA 01/11/23 with subsequent acute Lt CVA affecting Rt side, PD, Anxiety, HTN, HLD, multiple back surgeries/fusion neck and lumbar spine,  NSTEMI, 01/2020, hx of multiple syncopal episodes, CVA in 2017   PRECAUTIONS: Fall  WEIGHT BEARING RESTRICTIONS: No  PAIN:  Are you having pain? Yes: NPRS scale: 5/10 Pain location: back Pain description: pulled Aggravating factors: exercises Relieving  factors: rest, medications  FALLS: Has patient fallen in last 6 months? Yes. Number of falls 3  LIVING ENVIRONMENT: Lives with: lives with their spouse Lives in: House/apartment Stairs: Yes: External: 1 steps; none Has following equipment at home: Single point cane, Walker - 2 wheeled, Environmental Consultant - 4 wheeled, shower chair, Grab bars, and [employer] and transport chair  PLOF: Independent; horticulturist, commercial; driving   PATIENT GOALS: To improve dishes, folding laundry, and   OBJECTIVE:  Note: Objective measures were completed at Evaluation unless otherwise noted.  HAND DOMINANCE: Right  ADLs: Overall ADLs: mod I  Eating: setup for cutting salad  Equipment: hower seat with back, Grab bars, Walk in shower, Reacher, Sock aid, Long handled shoe horn, and Long handled sponge  IADLs: Shopping: supervision Light housekeeping: mod I Meal Prep: mod I Community mobility: driving mod I Medication  management: mod I Financial management: 50/50 with wife Handwriting: 25% legible or less with pen vs pencil  MOBILITY STATUS: Needs Assist: uses SPC and Hx of falls   POSTURE COMMENTS:  rounded shoulders, forward head, increased thoracic kyphosis, posterior pelvic tilt, and weight shift right   ACTIVITY TOLERANCE: Activity tolerance: good to fair  FUNCTIONAL OUTCOME MEASURES: Fastening/unfastening 3 buttons: TBD Physical performance test: PPT#2 (simulated eating) Right: 25 seconds; Left: 24 sec & PPT#4 (donning/doffing jacket): TBD  HAND FUNCTION: Grip strength: Right: 28.2 lbs  COORDINATION: 9 Hole Peg test: Right: 168 sec; Left: 57 sec Box and Blocks:  Right 25 blocks, Left 43 blocks  UE ROM:  WFL; chronic wrist injury with limited flexion and no ext.  UE MMT:   WFL  SENSATION: Moderate to severe paresthesias in R hand and fingers primarily but extends up to shoulder, worse since stroke   MUSCLE TONE: RUE: Rigidity; mild   COGNITION: Overall cognitive status: Within  functional limits for tasks assessed  OBSERVATIONS: Bradykinesia and Postural tremors                                                                                                                    TREATMENT :  OT educated pt on rehabilitation process and results of objective measures in relation to pt specific goals.    OT educated pt on use of salad chopper options to cut lettuce more independently.   PATIENT EDUCATION: Education details: OT Role and POC; AD for salad Person educated: Patient Education method: Explanation, Demonstration, and Handouts Education comprehension: verbalized understanding and needs further education  HOME EXERCISE PROGRAM: N/A for this visit  GOALS:  SHORT TERM GOALS: Target date: 12/07/2023    Pt will be independent with PD specific HEP.  Baseline: not yet initiated Goal status: INITIAL  2.  Pt will verbalize understanding of adapted strategies to maximize safety and independence with ADLs/IADLs.  Baseline: not yet initiated Goal status: INITIAL  3.  Pt will write a sentence with no significant decrease in size and maintain 75% legibility.  Baseline: ***% legibility Goal status: INITIAL  4.  Pt will demonstrate improved fine motor coordination for ADLs as evidenced by decreasing 9 hole peg test score for *** hand by at least *** seconds.  Baseline: *** seconds Goal status: INITIAL  5.  Pt will demonstrate improved ease with fastening buttons as evidenced by decreasing 3 button/unbutton time by at least *** seconds.  Baseline: *** seconds Goal status: INITIAL  6.  Pt will be able to place at least *** blocks using *** hand with completion of Box and Blocks test.  Baseline: *** blocks Goal status: INITIAL   LONG TERM GOALS: Target date: 01/21/2024    1.  Pt will verbalize understanding of ways to prevent future PD related complications and PD community resources.  Baseline: not yet initiated Goal status: INITIAL  2.  Pt will  write a short paragraph with no significant decrease in size and maintain 100% legibility.  Baseline: ***%  legibility Goal status: INITIAL  3.  Pt will verbalize understanding of ways to keep thinking skills sharp and ways to compensate for STM changes in the future.  Baseline: not yet initiated Goal status: INITIAL  4.  Pt will demonstrate improved ease with feeding as evidenced by decreasing PPT#2 by at least *** seconds.  Baseline: *** seconds Goal status: INITIAL  5.  Pt will demonstrate increased ease with dressing as evidenced by decreasing PPT#4 (don/ doff jacket) to *** secs or less.  Baseline: *** seconds Goal status: INITIAL  6.  Pt will demonstrate improved fine motor coordination for ADLs as evidenced by decreasing 9 hole peg test score for *** hand by at least *** secs  Baseline: *** seconds Goal status: INITIAL   7.  Pt will demonstrate improved ease with fastening buttons as evidenced by decreasing 3 button/unbutton time by at least *** seconds  Baseline: *** seconds Goal status: INITIAL  8.  Pt will be able to place at least *** blocks using *** hand with completion of Box and Blocks test.  Baseline: *** blocks Goal status: INITIAL   ASSESSMENT:  CLINICAL IMPRESSION: Patient is a 78 y.o. male who was seen today for occupational therapy evaluation for PD management. Hx includes ***. Patient presents to clinic seeking skilled therapy services to better manage occupational barriers secondary to Parkinson's and improve independence and safety with ADLs and IADLs.    PERFORMANCE DEFICITS: in functional skills including {OT physical skills:25468}, cognitive skills including {OT cognitive skills:25469}, and psychosocial skills including {OT psychosocial skills:25470}.   IMPAIRMENTS: are limiting patient from {OT performance deficits:25471}.   COMORBIDITIES:  {Comorbidities:25485} that affects occupational performance. Patient will benefit from skilled OT to address  above impairments and improve overall function.  MODIFICATION OR ASSISTANCE TO COMPLETE EVALUATION: {OT modification:25474}  OT OCCUPATIONAL PROFILE AND HISTORY: {OT PROFILE AND HISTORY:25484}  CLINICAL DECISION MAKING: {OT CDM:25475}  REHAB POTENTIAL: {rehabpotential:25112}  EVALUATION COMPLEXITY: Moderate    PLAN:  OT FREQUENCY: 1-2x/week  OT DURATION: 8 weeks  PLANNED INTERVENTIONS: 97168 OT Re-evaluation, 97535 self care/ADL training, 02889 therapeutic exercise, 97530 therapeutic activity, 97112 neuromuscular re-education, 97750 Physical Performance Testing, functional mobility training, coping strategies training, patient/family education, and DME and/or AE instructions  RECOMMENDED OTHER SERVICES: N/A for this visit  CONSULTED AND AGREED WITH PLAN OF CARE: Patient  PLAN FOR NEXT SESSION: Assess and work on buttons and jacket; review prior HEPs; writing   Jocelyn CHRISTELLA Bottom, OT 11/09/2023, 1:13 PM

## 2023-11-09 NOTE — Therapy (Signed)
 OUTPATIENT PHYSICAL THERAPY NEURO TREATMENT   Patient Name: Joshua [name] MRN: [id number] DOB:March 26, 1945, 78 y.o., male Today's Date: 11/09/2023   PCP: Loreli Elsie JONETTA Mickey., MD REFERRING PROVIDER: Loreli Elsie JONETTA Mickey., MD  END OF SESSION:  PT End of Session - 11/09/23 1235     Visit Number 4    Number of Visits 17   Plus eval   Date for Recertification  01/05/24    Authorization Type [employer]    PT Start Time 1234   Handoff w/OT   PT Stop Time 1314    PT Time Calculation (min) 40 min    Equipment Utilized During Treatment Gait belt    Activity Tolerance Patient tolerated treatment well    Behavior During Therapy [facility] for tasks assessed/performed           Past Medical History:  Diagnosis Date   Allergic rhinitis    Anxiety    from chronic pain from surgery- on Cymbalta    Arthritis    Asthma    Barrett's esophagus 03/29/2014   Carotid artery disease    Carotid Doppler normal August, 2007   Coronary artery disease    Diverticulosis    Dyslipidemia    GERD (gastroesophageal reflux disease)    History of loop recorder    has since 04/04/15   HTN (hypertension)    takes Metoprolol  for PVC control   Hx of colonic polyps    adenomatous   IFG (impaired fasting glucose)    Myocardial infarction (HCC)    mild   Neuromuscular disorder (HCC)    parkinson's tremors in hands   Palpitations    Benign PVCs   Parkinson disease (HCC)    Pneumonia    Prostate cancer (HCC)    RBBB (right bundle branch block)    rate related   Shingles    Stroke [facility]) 2017   January 01, 2023,right hand weakness,   TIA (transient ischemic attack) 02/2015   Per pt, had 2 strokes   Vertigo    Past Surgical History:  Procedure Laterality Date   BACK SURGERY  2002,2009   x 6   CHOLECYSTECTOMY  11/30/2012   with IOC   COLONOSCOPY     ELECTROPHYSIOLOGIC STUDY N/A 09/26/2015   Procedure: V Tach Ablation (PVC);  Surgeon: Will Gladis Norton, MD;  Location: [facility] INVASIVE CV  LAB;  Service: Cardiovascular;  Laterality: N/A;   EP IMPLANTABLE DEVICE N/A 04/04/2015   Procedure: Loop Recorder Insertion;  Surgeon: Will Gladis Norton, MD;  Location: [facility] INVASIVE CV LAB;  Service: Cardiovascular;  Laterality: N/A;   HERNIA REPAIR     laprascopic   KNEE SURGERY     DECEMBER 2017,LEFT KNEE SCOPED   LEFT HEART CATH AND CORONARY ANGIOGRAPHY N/A 02/05/2021   Procedure: LEFT HEART CATH AND CORONARY ANGIOGRAPHY;  Surgeon: Darron Deatrice LABOR, MD;  Location: [facility] INVASIVE CV LAB;  Service: Cardiovascular;  Laterality: N/A;   NECK SURGERY  2002   POLYPECTOMY     ROTATOR CUFF REPAIR Left    TEE WITHOUT CARDIOVERSION N/A 04/04/2015   Procedure: TRANSESOPHAGEAL ECHOCARDIOGRAM (TEE);  Surgeon: Redell GORMAN Shallow, MD;  Location: [facility] ENDOSCOPY;  Service: Cardiovascular;  Laterality: N/A;   TOTAL KNEE ARTHROPLASTY Left 09/28/2022   Procedure: TOTAL KNEE ARTHROPLASTY;  Surgeon: Edna Toribio LABOR, MD;  Location: [facility] ORS;  Service: Orthopedics;  Laterality: Left;   TOTAL KNEE ARTHROPLASTY Right 01/11/2023   Procedure: TOTAL KNEE ARTHROPLASTY;  Surgeon: Edna Toribio LABOR, MD;  Location: [facility] ORS;  Service: Orthopedics;  Laterality: Right;   TRIGGER FINGER RELEASE Left 12/28/2019   Procedure: RELEASE TRIGGER FINGER/A-1 PULLEY THUMB, MIDDLE AND RING;  Surgeon: Murrell Kuba, MD;  Location: [facility];  Service: Orthopedics;  Laterality: Left;  FAB   UPPER GASTROINTESTINAL ENDOSCOPY     V Tach ablation  09/26/2015   Patient Active Problem List   Diagnosis Date Noted   CVA (cerebral vascular accident) (HCC) 01/15/2023   Primary osteoarthritis of right knee 01/11/2023   Localized osteoarthritis of right knee 01/11/2023   Primary osteoarthritis of left knee 09/28/2022   SBO (small bowel obstruction) (HCC) 05/29/2022   Chest pain 05/26/2022   Epigastric abdominal pain 05/26/2022   Coronary artery disease involving native coronary artery of native heart without angina pectoris  05/28/2021   History of myocardial infarction 02/05/2021   Parkinson's disease (HCC)    Malignant neoplasm of prostate (HCC) 11/01/2017   Essential tremor 12/06/2015   PVC (premature ventricular contraction) 09/26/2015   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) 04/07/2015   Acute CVA (cerebrovascular accident) (HCC) 03/06/2015   Stroke (cerebrum) (HCC) 03/05/2015   Cerebral infarction (HCC) 03/05/2015   Weakness of right upper extremity    Tremor    Ejection fraction    Vertigo    Sleep apnea    Palpitations    Hyperlipidemia with target LDL less than 70    GERD (gastroesophageal reflux disease)    HTN (hypertension)    Chest pain    RBBB (right bundle branch block)    Ejection fraction    Carotid artery disease (HCC)     ONSET DATE: 10/15/2023 (referral)   REFERRING DIAG: R29.6 (ICD-10-CM) - Recurrent falls  THERAPY DIAG:  Muscle weakness (generalized)  Other lack of coordination  Other low back pain  Abnormal posture  Unsteadiness on feet  Rationale for Evaluation and Treatment: Rehabilitation  SUBJECTIVE:                                                                                                                                                                                             SUBJECTIVE STATEMENT:  Pt presents holding cane, kept rollator in the car due to the weather. No falls. States his L hip was extremely sore and painful following last session and lasted through the weekend. Thinks it was caused by PWR up on the rockerboard. Feeling better today but his knees are bothersome.   Pt accompanied by: self  PERTINENT HISTORY: Rt TKA 01/11/23 with subsequent acute Lt CVA affecting Rt side, PD, Anxiety, HTN, HLD, multiple back surgeries/fusion neck and lumbar spine,  NSTEMI, 01/2020,  hx of multiple syncopal episodes, CVA in 2017  PAIN:  Are you having pain? Yes: NPRS scale: 2/10, 7/10 Pain location: low back (L2), bilateral knees   Pain description: Achy  Aggravating factors: Too much movement  Relieving factors: Ablation, medications   PRECAUTIONS: Fall  RED FLAGS: None   WEIGHT BEARING RESTRICTIONS: No  FALLS: Has patient fallen in last 6 months? Yes. Number of falls 3  LIVING ENVIRONMENT: Lives with: lives with their spouse Lives in: House/apartment Stairs: Yes: External: 1 steps; none Has following equipment at home: Single point cane, Walker - 2 wheeled, Environmental Consultant - 4 wheeled, shower chair, Grab bars, and [employer] and transport chair  PLOF: Independent  PATIENT GOALS: Just to work on my balance   OBJECTIVE:  Note: Objective measures were completed at Evaluation unless otherwise noted.  DIAGNOSTIC FINDINGS: MRI of Lumbar spine from 05/15/23  IMPRESSION: 1. Operative changes of posterior fusion from L3 through S1. Interbody fusion at these levels. Prior laminotomies at L3-L4 and L5-S1. 2. Moderate foraminal stenoses on the right at L3-L4 and bilaterally at L4-L5. 3. Mild canal stenosis at L2-L3 and L3-L4.  CT of head from 09/10/23 CT CERVICAL SPINE FINDINGS   Alignment: Mild anterolisthesis of C2 on C3 and C4 on C5 is degenerate etiology and unchanged from the prior. No traumatic malalignment.   Skull base and vertebrae: No evidence of acute fracture. Vertebral body heights are maintained. Solid C5-C6 ACDF.   Soft tissues and spinal canal: No prevertebral fluid or swelling. No visible canal hematoma.   Disc levels: Similar multilevel degenerative change.   Upper chest: Visualized lung apices are clear.   IMPRESSION: No evidence of acute abnormality intracranially or in the cervical spine.  COGNITION: Overall cognitive status: Within functional limits for tasks assessed   SENSATION: Pt reports bilateral numbness/tingling in distal BLEs from feet to knees    POSTURE: rounded shoulders, forward head, increased thoracic kyphosis, posterior pelvic tilt, and weight shift  right  LOWER EXTREMITY ROM:     Active  Right Eval Left Eval  Hip flexion    Hip extension    Hip abduction    Hip adduction    Hip internal rotation    Hip external rotation    Knee flexion    Knee extension    Ankle dorsiflexion    Ankle plantarflexion    Ankle inversion    Ankle eversion     (Blank rows = not tested)  LOWER EXTREMITY MMT:    MMT Right Eval Left Eval  Hip flexion    Hip extension    Hip abduction    Hip adduction    Hip internal rotation    Hip external rotation    Knee flexion    Knee extension    Ankle dorsiflexion    Ankle plantarflexion    Ankle inversion    Ankle eversion    (Blank rows = not tested)  BED MOBILITY:  Not tested Pt reports difficulty getting out of the bed due to back pain and has increased difficulty getting out on the R side   TRANSFERS: Sit to stand: SBA  Assistive device utilized: None     Stand to sit: SBA  Assistive device utilized: None      RAMP:  Not tested  CURB:  Not tested  STAIRS: Not tested GAIT: Gait pattern: step through pattern, decreased step length- Right, decreased stance time- Left, decreased stride length, decreased ankle dorsiflexion- Right, knee flexed in stance- Right, knee flexed  in stance- Left, lateral hip instability, lateral lean- Right, decreased trunk rotation, trunk flexed, and poor foot clearance- Right Distance walked: Various clinic distances  Assistive device utilized: Single point cane and None Level of assistance: SBA Comments: Pt presented to session carrying SPC but did not use it during session. Noted continued truncal lean to R side but no overt LOB noted.    VITALS  Vitals:   11/09/23 1238  BP: 101/65  Pulse: 71                                                                                                                                  TREATMENT:    Self-care  Assessed vitals in LUE while seated (see above) and WNL   Ther Act/NMR SciFit multi-peaks  level 2.0 for 8 minutes using BUE/BLEs for neural priming for reciprocal movement, dynamic cardiovascular warmup and increased amplitude of stepping. Pt requesting a reduced resistance today as last session was too much. RPE of 1/10 following activity.  On mat table, practiced getting into/out of bed on R side using log roll technique x2. Pt able to get into bed well, but struggles to get out. Noted pt attempting to push up w/R arm prior to dropping legs off mat, so cued pt to ensure legs are fully off mat (bed) prior to pushing up to sit and pt able to perform much more efficiently. Pt reports he will practice this at home and see if it helps.  Sidelying open books, x10 per side, for improved rolling in bed and thoracic mobility. Pt performed well w/no report in pain. Added to HEP (See bolded below).  Reviewed HEP from previous POC and updated as appropriate (see below):  Standing heel/toe raises at counter, x20 reps. Pt mostly moving L foot and noted pt sliding L foot on ground throughout activity from hip-width position to semi-tandem stance.  Single leg stance w/counter support, 2x30s holds per side. Noted pt using R hand to support on counter, resulting in increased lean to R side, so cued pt to use L hand instead.  Pt reports he does not have foam pad at home and does not feel safe standing on pillows, so removed his exercises involving unlevel surface and added standing w/EC in romberg stance in corner. Pt demonstrated moderate A/P sway w/activity.    PATIENT EDUCATION: Education details: Proper log roll technique, updated HEP Person educated: Patient Education method: Explanation, Demonstration, Verbal cues, and Handouts Education comprehension: verbalized understanding, returned demonstration, verbal cues required, and needs further education  HOME EXERCISE PROGRAM: From previous POC:  Standing PWR moves Multi-directional stepping sheet  Pt also has a stretching program he has at home.     Access Code: [email or web address] URL: [email or web address] Date: 11/09/2023 Prepared by: Marlon Alyshia Kernan  Exercises - Heel Toe Raises with Counter Support  - 1 x daily - 5 x weekly - 1-2 sets - 10 reps - Standing Single Leg Stance  with Counter Support  - 1 x daily - 5 x weekly - 3 sets - 10-15 hold - Sidelying Open Book Thoracic Lumbar Rotation and Extension  - 1 x daily - 7 x weekly - 3 sets - 10 reps - Standing Near Stance in Corner with Eyes Closed  - 1 x daily - 7 x weekly - 3 sets - 30-45 seconds  hold  GOALS: Goals reviewed with patient? Yes  SHORT TERM GOALS: Target date: 11/24/2023   Pt will improve miniBEST to at least a 22/28 in order to demo decr fall risk.  Baseline:20/28 Goal status: INITIAL  2.  Pt will improve gait velocity to at least 2.7 ft/s w/LRAD for improved gait efficiency and independence   Baseline: 2.55 ft/s w/rollator  Goal status: INITIAL  3.  Pt will verbalize and demonstrate understanding of proper bed mobility techniques for improved independence and efficiency w/bed mobility at home  Baseline:  Goal status: INITIAL   LONG TERM GOALS: Target date: 12/22/2023   Pt will improve miniBEST to at least a 25/28 in order to demo decr fall risk. Baseline: 20/28 Goal status: INITIAL  2.  Pt will improve gait velocity to at least 3.0 ft/s w/LRAD for improved gait efficiency and independence  Baseline: 2.55 ft/s w/rollator  Goal status: INITIAL   ASSESSMENT:  CLINICAL IMPRESSION: Emphasis of skilled PT session on bed mobility techniques, thoracic mobility and review of HEP. Pt reported significant hip/low back pain following last session and suspects it was due to PWR up activity. Pt requesting to go lighter on SciFit and squat activity today, so focused on bed mobility as pt reports this is still a challenge. Pt able to perform log roll technique well if cued on proper timing, as he was not dropping his legs off bed prior to pushing up to sit,  resulting in significant difficulty coming to seated position. Added sidelying open books to encourage more thoracic mobility to assist w/rolling in bed as well. Pt Will continue per POC.    OBJECTIVE IMPAIRMENTS: Abnormal gait, decreased activity tolerance, decreased balance, decreased coordination, decreased knowledge of use of DME, decreased mobility, difficulty walking, decreased strength, increased fascial restrictions, increased muscle spasms, impaired sensation, improper body mechanics, postural dysfunction, and pain  ACTIVITY LIMITATIONS: carrying, lifting, bending, standing, squatting, stairs, transfers, bed mobility, reach over head, locomotion level, and caring for others  PARTICIPATION LIMITATIONS: meal prep, cleaning, laundry, interpersonal relationship, shopping, community activity, and yard work  PERSONAL FACTORS: Fitness, Past/current experiences, and 1-2 comorbidities: CVA and PD are also affecting patient's functional outcome.   REHAB POTENTIAL: Good  CLINICAL DECISION MAKING: Evolving/moderate complexity  EVALUATION COMPLEXITY: Moderate  PLAN:  PT FREQUENCY: 2x/week  PT DURATION: 8 weeks  PLANNED INTERVENTIONS: 97164- PT Re-evaluation, 97750- Physical Performance Testing, 97110-Therapeutic exercises, 97530- Therapeutic activity, [id number]- Neuromuscular re-education, 97535- Self Care, [id number]- Manual therapy, [id number]- Gait training, [phone]- Aquatic Therapy, [phone]- Electrical stimulation (manual), [phone] (1-2 muscles), [id number] (3+ muscles)- Dry Needling, Patient/Family education, Balance training, Stair training, Joint mobilization, Spinal mobilization, Vestibular training, and DME instructions  PLAN FOR NEXT SESSION: Work on lateral weight shifting, functional strength of LLE, postural control. SLS stability. Bed mobility (gets out on R side) Review remainder of HEP (stepping sheet) and update as appropriate.    Martavia Tye E Vinia Jemmott, PT, DPT 11/09/2023, 2:16 PM

## 2023-11-12 ENCOUNTER — Encounter: Payer: Self-pay | Admitting: Physical Therapy

## 2023-11-12 ENCOUNTER — Ambulatory Visit: Admitting: Occupational Therapy

## 2023-11-12 ENCOUNTER — Ambulatory Visit: Admitting: Physical Therapy

## 2023-11-12 VITALS — BP 103/56 | HR 75

## 2023-11-12 DIAGNOSIS — R29818 Other symptoms and signs involving the nervous system: Secondary | ICD-10-CM

## 2023-11-12 DIAGNOSIS — R2681 Unsteadiness on feet: Secondary | ICD-10-CM

## 2023-11-12 DIAGNOSIS — G250 Essential tremor: Secondary | ICD-10-CM

## 2023-11-12 DIAGNOSIS — R293 Abnormal posture: Secondary | ICD-10-CM

## 2023-11-12 DIAGNOSIS — R29898 Other symptoms and signs involving the musculoskeletal system: Secondary | ICD-10-CM

## 2023-11-12 DIAGNOSIS — M6281 Muscle weakness (generalized): Secondary | ICD-10-CM

## 2023-11-12 DIAGNOSIS — Z9181 History of falling: Secondary | ICD-10-CM

## 2023-11-12 DIAGNOSIS — R278 Other lack of coordination: Secondary | ICD-10-CM

## 2023-11-12 NOTE — Therapy (Signed)
 OUTPATIENT OCCUPATIONAL THERAPY PARKINSON'S TREATMENT  Patient Name: Joshua [name] MRN: [id number] DOB:March 25, 1945, 78 y.o., male Today's Date: 11/12/2023  PCP: Loreli Elsie JONETTA Mickey., MD  REFERRING PROVIDER: Evonnie Asberry RAMAN, DO  END OF SESSION:  OT End of Session - 11/12/23 1139     Visit Number 2    Number of Visits 17    Date for Recertification  01/21/24   currently only schedule until 11/26   Authorization Type [employer]    OT Start Time 1149    OT Stop Time 1231    OT Time Calculation (min) 42 min    Activity Tolerance Patient tolerated treatment well    Behavior During Therapy [facility] for tasks assessed/performed         Past Medical History:  Diagnosis Date   Allergic rhinitis    Anxiety    from chronic pain from surgery- on Cymbalta    Arthritis    Asthma    Barrett's esophagus 03/29/2014   Carotid artery disease    Carotid Doppler normal August, 2007   Coronary artery disease    Diverticulosis    Dyslipidemia    GERD (gastroesophageal reflux disease)    History of loop recorder    has since 04/04/15   HTN (hypertension)    takes Metoprolol  for PVC control   Hx of colonic polyps    adenomatous   IFG (impaired fasting glucose)    Myocardial infarction (HCC)    mild   Neuromuscular disorder (HCC)    parkinson's tremors in hands   Palpitations    Benign PVCs   Parkinson disease (HCC)    Pneumonia    Prostate cancer (HCC)    RBBB (right bundle branch block)    rate related   Shingles    Stroke [facility]) 2017   January 01, 2023,right hand weakness,   TIA (transient ischemic attack) 02/2015   Per pt, had 2 strokes   Vertigo    Past Surgical History:  Procedure Laterality Date   BACK SURGERY  2002,2009   x 6   CHOLECYSTECTOMY  11/30/2012   with IOC   COLONOSCOPY     ELECTROPHYSIOLOGIC STUDY N/A 09/26/2015   Procedure: V Tach Ablation (PVC);  Surgeon: Will Gladis Norton, MD;  Location: [facility] INVASIVE CV [facility];  Service: Cardiovascular;   Laterality: N/A;   EP IMPLANTABLE DEVICE N/A 04/04/2015   Procedure: Loop Recorder Insertion;  Surgeon: Will Gladis Norton, MD;  Location: [facility] INV[facility] CV [facility];  Service: Cardiovascular;  Laterality: N/A;   HERNIA REPAIR     laprascopic   KNEE SURGERY     DECEMBER 2017,LEFT KNEE SCOPED   LEFT HEART CATH AND CORONARY ANGIOGRAPHY N/A 02/05/2021   Procedure: LEFT HEART CATH AND CORONARY ANGIOGRAPHY;  Surgeon: Darron Deatrice LABOR, MD;  Location: [facility]AS[facility] CV [facility];  Service: Cardiovascular;  Laterality: N/A;   NECK SURGERY  2002   POLYPECTOMY     ROTATOR CUFF REPAIR Left    TEE WITHOUT CARDIOVERSION N/A 04/04/2015   Procedure: TRANSESOPHAGEAL ECHOCARDIOGRAM (TEE);  Surgeon: Redell RAMAN Shallow, MD;  Location: [facility] ENDOSCOPY;  Service: Cardiovascular;  Laterality: N/A;   TOTAL KNEE ARTHROPLASTY Left 09/28/2022   Procedure: TOTAL KNEE ARTHROPLASTY;  Surgeon: Edna Toribio LABOR, MD;  Location: [facility] ORS;  Service: Orthopedics;  Laterality: Left;   TOTAL KNEE ARTHROPLASTY Right 01/11/2023   Procedure: TOTAL KNEE ARTHROPLASTY;  Surgeon: Edna Toribio LABOR, MD;  Location: [facility] OR[facility];  Service: Orthopedics;  Laterality: Right;   TRIGGER FINGER RELEASE Left 12/28/2019  Procedure: RELEASE TRIGGER FINGER/A-1 PULLEY THUMB, MIDDLE AND RING;  Surgeon: Murrell Kuba, MD;  Location: [facility];  Service: Orthopedics;  Laterality: Left;  FAB   UPPER GASTROINTESTINAL ENDOSCOPY     V Tach ablation  09/26/2015   Patient Active Problem List   Diagnosis Date Noted   CVA (cerebral vascular accident) (HCC) 01/15/2023   Primary osteoarthritis of right knee 01/11/2023   Localized osteoarthritis of right knee 01/11/2023   Primary osteoarthritis of left knee 09/28/2022   SBO (small bowel obstruction) (HCC) 05/29/2022   Chest pain 05/26/2022   Epigastric abdominal pain 05/26/2022   Coronary artery disease involving native coronary artery of native heart without angina pectoris 05/28/2021   History of myocardial  infarction 02/05/2021   Parkinson's disease (HCC)    Malignant neoplasm of prostate (HCC) 11/01/2017   Essential tremor 12/06/2015   PVC (premature ventricular contraction) 09/26/2015   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) 04/07/2015   Acute CVA (cerebrovascular accident) (HCC) 03/06/2015   Stroke (cerebrum) (HCC) 03/05/2015   Cerebral infarction (HCC) 03/05/2015   Weakness of right upper extremity    Tremor    Ejection fraction    Vertigo    Sleep apnea    Palpitations    Hyperlipidemia with target LDL less than 70    GERD (gastroesophageal reflux disease)    HTN (hypertension)    Chest pain    RBBB (right bundle branch block)    Ejection fraction    Carotid artery disease (HCC)    ONSET DATE: 11/03/2023 (Date of referral)  REFERRING DIAG: G20.B1 (ICD-10-CM) - Parkinson's disease with dyskinesia, without mention of fluctuations   THERAPY DIAG:  Muscle weakness (generalized)  Other lack of coordination  Other symptoms and signs involving the nervous system  Other symptoms and signs involving the musculoskeletal system  Essential tremor  History of falling  Rationale for Evaluation and Treatment: Rehabilitation  SUBJECTIVE:   SUBJECTIVE STATEMENT: He reports he cannot find his PD binder. He really struggles with buttons and often uses a button hook at home.   He had a hard time recalling/naming animals during his PT visit.   Pt accompanied by: self  PERTINENT HISTORY: Rt TKA 01/11/23 with subsequent acute Lt CVA affecting Rt side, PD, Anxiety, HTN, HLD, multiple back surgeries/fusion neck and lumbar spine,  NSTEMI, 01/2020, hx of multiple syncopal episodes, CVA in 2017   PRECAUTIONS: Fall  WEIGHT BEARING RESTRICTIONS: No  PAIN:  Are you having pain? Yes: NPRS scale: 5/10 Pain location: back Pain description: pulled Aggravating factors: exercises Relieving factors: rest, medications  FALLS: Has patient fallen in last 6 months? Yes.  Number of falls 3  LIVING ENVIRONMENT: Lives with: lives with their spouse Lives in: House/apartment Stairs: Yes: External: 1 steps; none Has following equipment at home: Single point cane, Walker - 2 wheeled, Environmental Consultant - 4 wheeled, shower chair, Grab bars, and [employer] and transport chair  PLOF: Independent; horticulturist, commercial; driving   PATIENT GOALS: To improve dishes, folding laundry, and   OBJECTIVE:  Note: Objective measures were completed at Evaluation unless otherwise noted.  HAND DOMINANCE: Right  ADLs: Overall ADLs: mod I  Eating: setup for cutting salad  Equipment: hower seat with back, Grab bars, Walk in shower, Reacher, Sock aid, Long handled shoe horn, and Long handled sponge  IADLs: Shopping: supervision Light housekeeping: mod I Meal Prep: mod I Community mobility: driving mod I Medication management: mod I Financial management: 50/50 with wife Handwriting: 25% legible  or less with pen vs pencil  MOBILITY STATUS: Needs Assist: uses SPC and Hx of falls   POSTURE COMMENTS:  rounded shoulders, forward head, increased thoracic kyphosis, posterior pelvic tilt, and weight shift right   ACTIVITY TOLERANCE: Activity tolerance: good to fair  FUNCTIONAL OUTCOME MEASURES: 11/12/2023: Fastening/unfastening 3 buttons: only able to button 2 buttons in 150 seconds  Physical performance test: PPT#2 (simulated eating) Right: 25 seconds; Left: 24 sec   PPT#4 (donning/doffing jacket): TBD  HAND FUNCTION: Grip strength: Right: 28.2 lbs  COORDINATION: 9 Hole Peg test: Right: 168 sec; Left: 57 sec Box and Blocks:  Right 25 blocks, Left 43 blocks  UE ROM:  WFL; chronic wrist injury with limited flexion and no ext.  UE MMT:   WFL  SENSATION: Moderate to severe paresthesias in R hand and fingers primarily but extends up to shoulder, worse since stroke   MUSCLE TONE: RUE: Rigidity; mild   COGNITION: Overall cognitive status: Within functional limits for  tasks assessed  OBSERVATIONS: Bradykinesia and Postural tremors                                                                                                                    TREATMENT :  - Self-care/home management completed for duration as noted below including: Objective measures assessed as noted in Goals section to determine progression towards goals.  Patient was instructed in the use of the push-pull method for buttoning and the pull-push method for unbuttoning. Emphasis was placed on:  Opening hands wide before each attempt to promote readiness and motor planning. Using slow, deliberate movements ("angry buttons") to increase focus and control. Coordinating bilateral hand use to manipulate buttons and fabric effectively.  OT educated pt on jacket strategy including big, deliberate movements and grasping jacket at belly button level to aid in doffing. Pt able to return demonstration with improved timing/accuracy.   - Therapeutic activities completed for duration as noted below including: Patient participated in a 48-card animal memory game designed to target cognitive and visual-perceptual skills. The activity addressed:  - Short-term memory through recall of card locations and matches. - Visual scanning and attention as the patient searched for matching pairs. - Turn-taking and impulse control during structured gameplay. - Fine motor coordination during card manipulation.   PATIENT EDUCATION: Education details: buttoning, jacket, Memory game, coordination Person educated: Patient Education method: Explanation, Demonstration, and Handouts Education comprehension: verbalized understanding and needs further education  HOME EXERCISE PROGRAM: 11/12/2023: buttoning  GOALS:  SHORT TERM GOALS: Target date: 12/07/2023    Pt will be independent with PD specific HEP.  Baseline: not yet initiated Goal status: INITIAL  2.  Pt will verbalize understanding of adapted  strategies to maximize safety and independence with ADLs/IADLs.  Baseline: not yet initiated Goal status: INITIAL  3.  Pt will write a sentence with no significant decrease in size and maintain 50% legibility.  Baseline: <25% legibility Goal status: INITIAL  4.  Pt will demonstrate improved fine motor coordination for ADLs as evidenced  by decreasing 9 hole peg test score for each hand by at least 8 seconds.  Baseline: Right: 168 sec; Left: 57 sec Goal status: INITIAL  5.  Pt will demonstrate improved ease with fastening buttons as evidenced by improving 3 button/unbutton time to be able to complete full assessment in 85 seconds or less.  Baseline: only able to button 2 buttons in 150 seconds - pt reported unable to feel his fingertips at time of eval Goal status: INITIAL  LONG TERM GOALS: Target date: 01/21/2024    1.  Pt will verbalize understanding of ways to prevent future PD related complications and PD community resources.  Baseline: not yet initiated Goal status: INITIAL  2.  Pt will write a short paragraph with no significant decrease in size and maintain 75% legibility.  Baseline: <25% legibility Goal status: INITIAL  3.  Pt will verbalize understanding of ways to keep thinking skills sharp and ways to compensate for STM changes in the future.  Baseline: not yet initiated Goal status: INITIAL  4.  Pt will demonstrate improved ease with feeding as evidenced by decreasing PPT#2 by at least 4 seconds.  Baseline: Right: 25 seconds; Left: 24 sec  Goal status: INITIAL  5.  Pt will demonstrate increased ease with dressing as evidenced by decreasing PPT#4 (don/ doff jacket) to 20 secs or less.  Baseline: 30 seconds Goal status: INITIAL  6.  Pt will demonstrate improved fine motor coordination for ADLs as evidenced by decreasing 9 hole peg test score for each hand by at least 12 secs  Baseline: Right: 168 sec; Left: 57 sec Goal status: INITIAL   7.  Pt will be able to  place at least 8 blocks using R hand with completion of Box and Blocks test.  Baseline:  Right 25 blocks, Left 43 blocks Goal status: INITIAL  ASSESSMENT:  CLINICAL IMPRESSION: Patient demonstrates good understanding of adaptive strategies for buttoning and jacket donning/doffing as needed to progress towards goals; however, he would benefit from repeat education to improve carryover and overall efficiency.   PERFORMANCE DEFICITS: in functional skills including ADLs, IADLs, coordination, dexterity, strength, Fine motor control, Gross motor control, mobility, decreased knowledge of precautions, decreased knowledge of use of DME, and UE functional use.   IMPAIRMENTS: are limiting patient from ADLs, IADLs, leisure, and social participation.   COMORBIDITIES:  may have co-morbidities  that affects occupational performance. Patient will benefit from skilled OT to address above impairments and improve overall function.  REHAB POTENTIAL: Good  PLAN:  OT FREQUENCY: 1-2x/week  OT DURATION: 8 weeks  PLANNED INTERVENTIONS: 97168 OT Re-evaluation, 97535 self care/ADL training, 02889 therapeutic exercise, 97530 therapeutic activity, 97112 neuromuscular re-education, 97750 Physical Performance Testing, functional mobility training, coping strategies training, patient/family education, and DME and/or AE instructions  RECOMMENDED OTHER SERVICES: N/A for this visit  CONSULTED AND AGREED WITH PLAN OF CARE: Patient  PLAN FOR NEXT SESSION: Assess (with button hook)/review buttons; review prior HEPs; writing   Jocelyn CHRISTELLA Bottom, OT 11/12/2023, 1:57 PM

## 2023-11-12 NOTE — Patient Instructions (Signed)
 BUTTONING  Open hands big before fastening or unfastening each button. Use deliberate movements - angry buttons.  Button by using push-pull method - push button through hole with one hand and pull the fabric back with the other hand.   Unfasten by using pull-push method - pull the fabric back with one hand and push the button through with the other.      To DON jacket:   1) Wide stance 2) Hold facing tag out with hands shoulder width apart at sleeves 3) Swing around back big like a cape to RIGHT side 4) Punch RIGHT arm in as you continue to pull behind you with LEFT hand to LEFT 5) Punch LEFT arm in   To DOFF jacket:   1) Wide stance 2) Hold sides of jacket with big hands at belly button level 3 opt1) Throw arms behind you like PWR! Up 3 opt 1) Slide jacket down arms 4) Reach big behind you and grab cuff of sleeve BIG and yank off one hand, then the other

## 2023-11-12 NOTE — Therapy (Signed)
 OUTPATIENT PHYSICAL THERAPY NEURO TREATMENT   Patient Name: Joshua [name] MRN: [id number] DOB:1945-09-21, 78 y.o., male Today's Date: 11/12/2023   PCP: Loreli Elsie JONETTA Mickey., MD REFERRING PROVIDER: Loreli Elsie JONETTA Mickey., MD  END OF SESSION:  PT End of Session - 11/12/23 1105     Visit Number 5    Number of Visits 17   Plus eval   Date for Recertification  01/05/24    Authorization Type [employer]    PT Start Time 1103    PT Stop Time 1142    PT Time Calculation (min) 39 min    Equipment Utilized During Treatment Gait belt    Activity Tolerance Patient tolerated treatment well    Behavior During Therapy [facility] for tasks assessed/performed           Past Medical History:  Diagnosis Date   Allergic rhinitis    Anxiety    from chronic pain from surgery- on Cymbalta    Arthritis    Asthma    Barrett's esophagus 03/29/2014   Carotid artery disease    Carotid Doppler normal August, 2007   Coronary artery disease    Diverticulosis    Dyslipidemia    GERD (gastroesophageal reflux disease)    History of loop recorder    has since 04/04/15   HTN (hypertension)    takes Metoprolol  for PVC control   Hx of colonic polyps    adenomatous   IFG (impaired fasting glucose)    Myocardial infarction (HCC)    mild   Neuromuscular disorder (HCC)    parkinson's tremors in hands   Palpitations    Benign PVCs   Parkinson disease (HCC)    Pneumonia    Prostate cancer (HCC)    RBBB (right bundle branch block)    rate related   Shingles    Stroke [facility]) 2017   January 01, 2023,right hand weakness,   TIA (transient ischemic attack) 02/2015   Per pt, had 2 strokes   Vertigo    Past Surgical History:  Procedure Laterality Date   BACK SURGERY  2002,2009   x 6   CHOLECYSTECTOMY  11/30/2012   with IOC   COLONOSCOPY     ELECTROPHYSIOLOGIC STUDY N/A 09/26/2015   Procedure: V Tach Ablation (PVC);  Surgeon: Will Gladis Norton, MD;  Location: [facility] INVASIVE CV LAB;  Service:  Cardiovascular;  Laterality: N/A;   EP IMPLANTABLE DEVICE N/A 04/04/2015   Procedure: Loop Recorder Insertion;  Surgeon: Will Gladis Norton, MD;  Location: [facility] INVASIVE CV LAB;  Service: Cardiovascular;  Laterality: N/A;   HERNIA REPAIR     laprascopic   KNEE SURGERY     DECEMBER 2017,LEFT KNEE SCOPED   LEFT HEART CATH AND CORONARY ANGIOGRAPHY N/A 02/05/2021   Procedure: LEFT HEART CATH AND CORONARY ANGIOGRAPHY;  Surgeon: Darron Deatrice LABOR, MD;  Location: [facility] INVASIVE CV LAB;  Service: Cardiovascular;  Laterality: N/A;   NECK SURGERY  2002   POLYPECTOMY     ROTATOR CUFF REPAIR Left    TEE WITHOUT CARDIOVERSION N/A 04/04/2015   Procedure: TRANSESOPHAGEAL ECHOCARDIOGRAM (TEE);  Surgeon: Redell GORMAN Shallow, MD;  Location: [facility] ENDOSCOPY;  Service: Cardiovascular;  Laterality: N/A;   TOTAL KNEE ARTHROPLASTY Left 09/28/2022   Procedure: TOTAL KNEE ARTHROPLASTY;  Surgeon: Edna Toribio LABOR, MD;  Location: [facility] ORS;  Service: Orthopedics;  Laterality: Left;   TOTAL KNEE ARTHROPLASTY Right 01/11/2023   Procedure: TOTAL KNEE ARTHROPLASTY;  Surgeon: Edna Toribio LABOR, MD;  Location: [facility] ORS;  Service: Orthopedics;  Laterality: Right;   TRIGGER FINGER RELEASE Left 12/28/2019   Procedure: RELEASE TRIGGER FINGER/A-1 PULLEY THUMB, MIDDLE AND RING;  Surgeon: Murrell Kuba, MD;  Location: [facility];  Service: Orthopedics;  Laterality: Left;  FAB   UPPER GASTROINTESTINAL ENDOSCOPY     V Tach ablation  09/26/2015   Patient Active Problem List   Diagnosis Date Noted   CVA (cerebral vascular accident) (HCC) 01/15/2023   Primary osteoarthritis of right knee 01/11/2023   Localized osteoarthritis of right knee 01/11/2023   Primary osteoarthritis of left knee 09/28/2022   SBO (small bowel obstruction) (HCC) 05/29/2022   Chest pain 05/26/2022   Epigastric abdominal pain 05/26/2022   Coronary artery disease involving native coronary artery of native heart without angina pectoris 05/28/2021    History of myocardial infarction 02/05/2021   Parkinson's disease (HCC)    Malignant neoplasm of prostate (HCC) 11/01/2017   Essential tremor 12/06/2015   PVC (premature ventricular contraction) 09/26/2015   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) 04/07/2015   Acute CVA (cerebrovascular accident) (HCC) 03/06/2015   Stroke (cerebrum) (HCC) 03/05/2015   Cerebral infarction (HCC) 03/05/2015   Weakness of right upper extremity    Tremor    Ejection fraction    Vertigo    Sleep apnea    Palpitations    Hyperlipidemia with target LDL less than 70    GERD (gastroesophageal reflux disease)    HTN (hypertension)    Chest pain    RBBB (right bundle branch block)    Ejection fraction    Carotid artery disease (HCC)     ONSET DATE: 10/15/2023 (referral)   REFERRING DIAG: R29.6 (ICD-10-CM) - Recurrent falls  THERAPY DIAG:  Muscle weakness (generalized)  Other symptoms and signs involving the nervous system  Unsteadiness on feet  Abnormal posture  Rationale for Evaluation and Treatment: Rehabilitation  SUBJECTIVE:                                                                                                                                                                                             SUBJECTIVE STATEMENT:  Reports knees feel stiff today. No falls. Has done his exercises and they are going well. Reports bed mobility is going better at home after reviewing it.   Pt accompanied by: self  PERTINENT HISTORY: Rt TKA 01/11/23 with subsequent acute Lt CVA affecting Rt side, PD, Anxiety, HTN, HLD, multiple back surgeries/fusion neck and lumbar spine,  NSTEMI, 01/2020, hx of multiple syncopal episodes, CVA in 2017  PAIN:  Are you having pain? Yes: NPRS scale: 6/10 Pain location: low back (L2)  Pain description:  Achy  Aggravating factors: Too much movement  Relieving factors: Ablation, medications   PRECAUTIONS: Fall  RED FLAGS: None   WEIGHT  BEARING RESTRICTIONS: No  FALLS: Has patient fallen in last 6 months? Yes. Number of falls 3  LIVING ENVIRONMENT: Lives with: lives with their spouse Lives in: House/apartment Stairs: Yes: External: 1 steps; none Has following equipment at home: Single point cane, Walker - 2 wheeled, Environmental Consultant - 4 wheeled, shower chair, Grab bars, and [employer] and transport chair  PLOF: Independent  PATIENT GOALS: Just to work on my balance   OBJECTIVE:  Note: Objective measures were completed at Evaluation unless otherwise noted.  DIAGNOSTIC FINDINGS: MRI of Lumbar spine from 05/15/23  IMPRESSION: 1. Operative changes of posterior fusion from L3 through S1. Interbody fusion at these levels. Prior laminotomies at L3-L4 and L5-S1. 2. Moderate foraminal stenoses on the right at L3-L4 and bilaterally at L4-L5. 3. Mild canal stenosis at L2-L3 and L3-L4.  CT of head from 09/10/23 CT CERVICAL SPINE FINDINGS   Alignment: Mild anterolisthesis of C2 on C3 and C4 on C5 is degenerate etiology and unchanged from the prior. No traumatic malalignment.   Skull base and vertebrae: No evidence of acute fracture. Vertebral body heights are maintained. Solid C5-C6 ACDF.   Soft tissues and spinal canal: No prevertebral fluid or swelling. No visible canal hematoma.   Disc levels: Similar multilevel degenerative change.   Upper chest: Visualized lung apices are clear.   IMPRESSION: No evidence of acute abnormality intracranially or in the cervical spine.  COGNITION: Overall cognitive status: Within functional limits for tasks assessed   SENSATION: Pt reports bilateral numbness/tingling in distal BLEs from feet to knees    POSTURE: rounded shoulders, forward head, increased thoracic kyphosis, posterior pelvic tilt, and weight shift right  LOWER EXTREMITY ROM:     Active  Right Eval Left Eval  Hip flexion    Hip extension    Hip abduction    Hip adduction    Hip internal rotation    Hip  external rotation    Knee flexion    Knee extension    Ankle dorsiflexion    Ankle plantarflexion    Ankle inversion    Ankle eversion     (Blank rows = not tested)  LOWER EXTREMITY MMT:    MMT Right Eval Left Eval  Hip flexion    Hip extension    Hip abduction    Hip adduction    Hip internal rotation    Hip external rotation    Knee flexion    Knee extension    Ankle dorsiflexion    Ankle plantarflexion    Ankle inversion    Ankle eversion    (Blank rows = not tested)  BED MOBILITY:  Not tested Pt reports difficulty getting out of the bed due to back pain and has increased difficulty getting out on the R side   TRANSFERS: Sit to stand: SBA  Assistive device utilized: None     Stand to sit: SBA  Assistive device utilized: None      RAMP:  Not tested  CURB:  Not tested  STAIRS: Not tested GAIT: Gait pattern: step through pattern, decreased step length- Right, decreased stance time- Left, decreased stride length, decreased ankle dorsiflexion- Right, knee flexed in stance- Right, knee flexed in stance- Left, lateral hip instability, lateral lean- Right, decreased trunk rotation, trunk flexed, and poor foot clearance- Right Distance walked: Various clinic distances  Assistive device utilized: Single point  cane and None Level of assistance: SBA Comments: Pt presented to session carrying SPC but did not use it during session. Noted continued truncal lean to R side but no overt LOB noted.    VITALS  Vitals:   11/12/23 1110 11/12/23 1111  BP: (!) 92/57 (!) 103/56  Pulse: 71 75                                                                                                                                   TREATMENT:    Self-care  Vitals:   11/12/23 1110 11/12/23 1111  BP: (!) 92/57 (!) 103/56  Pulse: 71 75   Seated, Standing Pt reporting no lightheadedness, did take Midodrine  this morning. Pt planning on reaching out to his doctor about low BP as he  reports his BP has been lower the last few days. And is going to ask if they might need to change his medications   Pt takes Midodrine  daily, but does not wear compression socks. Discussed with pt that his PCP may suggest that he try compression socks first for low BP before trying a change in medication. Pt to ask about this and does report that he has compression socks at home   Assessed BP after standing activity (pt did not report any lightheadedness during session): 106/52, HR: 85 bpm    NMR On blue foam beam: Side stepping down and back x2 reps with ball toss with PT student, CGA for balance, cues to open up hands when tossing balls as pt with tendency to just toss and catch with his hands  Alternating stepping over small obstacle in lateral  direction to work on foot clearance, SLS stability, and weight shift, performed 10 reps each side, progressing to adding ball toss when stepping over laterally an additional 10 reps each side  Pt frequently needing to lean posteriorly against bars for balance  With 4 blaze pods on mirror in lateral and superior lateral directions to work on reaching outside of BOS, trunk rotation, weight shifting, stepping strategies, performed on random hit setting. Pt reaching across body with contra-lateral hand, pt standing on air ex:  Performed 1 minute: 16 hits Performed 1 minute: 11 hits - added in lateral step off each time when reaching across to tap blaze pod and having to step back on, intermittent cues with cues for foot clearance. Performed an additional 2 bouts of 1 minute with cognitive challenge with naming animals from A-Z: 7 hits, 5 hits. Pt much more challenged with this and does need cues at times on what animal to name. Pt also needing frequent reminder cues to continue to reach across body instead of tapping with ipsi-lateral hand      PATIENT EDUCATION: Education details: Continue HEP, see self-care section  Person educated: Patient Education  method: Explanation, Demonstration, and Verbal cues Education comprehension: verbalized understanding, returned demonstration, verbal cues required, and needs further education  HOME  EXERCISE PROGRAM: From previous POC:  Standing PWR moves Multi-directional stepping sheet  Pt also has a stretching program he has at home.    Access Code: [email or web address] URL: [email or web address] Date: 11/09/2023 Prepared by: Marlon Plaster  Exercises - Heel Toe Raises with Counter Support  - 1 x daily - 5 x weekly - 1-2 sets - 10 reps - Standing Single Leg Stance with Counter Support  - 1 x daily - 5 x weekly - 3 sets - 10-15 hold - Sidelying Open Book Thoracic Lumbar Rotation and Extension  - 1 x daily - 7 x weekly - 3 sets - 10 reps - Standing Near Stance in Corner with Eyes Closed  - 1 x daily - 7 x weekly - 3 sets - 30-45 seconds  hold  GOALS: Goals reviewed with patient? Yes  SHORT TERM GOALS: Target date: 11/24/2023   Pt will improve miniBEST to at least a 22/28 in order to demo decr fall risk.  Baseline:20/28 Goal status: INITIAL  2.  Pt will improve gait velocity to at least 2.7 ft/s w/LRAD for improved gait efficiency and independence   Baseline: 2.55 ft/s w/rollator  Goal status: INITIAL  3.  Pt will verbalize and demonstrate understanding of proper bed mobility techniques for improved independence and efficiency w/bed mobility at home  Baseline:  Goal status: INITIAL   LONG TERM GOALS: Target date: 12/22/2023   Pt will improve miniBEST to at least a 25/28 in order to demo decr fall risk. Baseline: 20/28 Goal status: INITIAL  2.  Pt will improve gait velocity to at least 3.0 ft/s w/LRAD for improved gait efficiency and independence  Baseline: 2.55 ft/s w/rollator  Goal status: INITIAL   ASSESSMENT:  CLINICAL IMPRESSION: Pt with lower BP in sitting (see above) and pt's systolic did incr in standing and diastolic stayed about the same, but still low. Pt reporting  no lightheadedness, but did report BP has been lower the past few days. Pt going to reach out to his doctor regarding this as he is taking Midodrine  2x a day. Pt not wearing compression socks, but does have that at home. Discussed that they may ask pt to try compression socks regarding low BP before they change his meds, pt going to ask his doctor about this. Worked on standing balance tasks on compliant surfaces for weight shifting, foot clearance, and dual tasking. Pt tolerating standing exercises well with no lightheadedness. Pt challenged with addition of cognitive task with blaze pods with moving slowly and needing cues of what to name. Will continue per POC.    OBJECTIVE IMPAIRMENTS: Abnormal gait, decreased activity tolerance, decreased balance, decreased coordination, decreased knowledge of use of DME, decreased mobility, difficulty walking, decreased strength, increased fascial restrictions, increased muscle spasms, impaired sensation, improper body mechanics, postural dysfunction, and pain  ACTIVITY LIMITATIONS: carrying, lifting, bending, standing, squatting, stairs, transfers, bed mobility, reach over head, locomotion level, and caring for others  PARTICIPATION LIMITATIONS: meal prep, cleaning, laundry, interpersonal relationship, shopping, community activity, and yard work  PERSONAL FACTORS: Fitness, Past/current experiences, and 1-2 comorbidities: CVA and PD are also affecting patient's functional outcome.   REHAB POTENTIAL: Good  CLINICAL DECISION MAKING: Evolving/moderate complexity  EVALUATION COMPLEXITY: Moderate  PLAN:  PT FREQUENCY: 2x/week  PT DURATION: 8 weeks  PLANNED INTERVENTIONS: 97164- PT Re-evaluation, 97750- Physical Performance Testing, 97110-Therapeutic exercises, 97530- Therapeutic activity, [id number]- Neuromuscular re-education, 97535- Self Care, [id number]- Manual therapy, [id number]- Gait training, [id number]- Aquatic Therapy, [phone]- Electrical stimulation (manual), [id number] (1-2  muscles), [id number] (3+  muscles)- Dry Needling, Patient/Family education, Balance training, Stair training, Joint mobilization, Spinal mobilization, Vestibular training, and DME instructions  PLAN FOR NEXT SESSION: Work on lateral weight shifting, functional strength of LLE, postural control. SLS stability. Bed mobility (gets out on R side) Review remainder of HEP (stepping sheet) and update as appropriate.    Sheffield LOISE Senate, PT, DPT 11/12/2023, 12:15 PM

## 2023-11-16 ENCOUNTER — Ambulatory Visit: Attending: Internal Medicine | Admitting: Physical Therapy

## 2023-11-16 ENCOUNTER — Ambulatory Visit: Admitting: Occupational Therapy

## 2023-11-16 ENCOUNTER — Encounter: Payer: Self-pay | Admitting: Physical Therapy

## 2023-11-16 VITALS — BP 110/66 | HR 84

## 2023-11-16 DIAGNOSIS — M5459 Other low back pain: Secondary | ICD-10-CM | POA: Insufficient documentation

## 2023-11-16 DIAGNOSIS — R29898 Other symptoms and signs involving the musculoskeletal system: Secondary | ICD-10-CM

## 2023-11-16 DIAGNOSIS — R29818 Other symptoms and signs involving the nervous system: Secondary | ICD-10-CM | POA: Insufficient documentation

## 2023-11-16 DIAGNOSIS — R2681 Unsteadiness on feet: Secondary | ICD-10-CM | POA: Insufficient documentation

## 2023-11-16 DIAGNOSIS — R278 Other lack of coordination: Secondary | ICD-10-CM

## 2023-11-16 DIAGNOSIS — Z9181 History of falling: Secondary | ICD-10-CM | POA: Insufficient documentation

## 2023-11-16 DIAGNOSIS — R471 Dysarthria and anarthria: Secondary | ICD-10-CM | POA: Insufficient documentation

## 2023-11-16 DIAGNOSIS — G250 Essential tremor: Secondary | ICD-10-CM | POA: Insufficient documentation

## 2023-11-16 DIAGNOSIS — R4701 Aphasia: Secondary | ICD-10-CM | POA: Insufficient documentation

## 2023-11-16 DIAGNOSIS — M6281 Muscle weakness (generalized): Secondary | ICD-10-CM

## 2023-11-16 DIAGNOSIS — R293 Abnormal posture: Secondary | ICD-10-CM | POA: Insufficient documentation

## 2023-11-16 NOTE — Patient Instructions (Addendum)
 Coordination Exercises   Perform the following exercises for 10 minutes 1-2 times per day. Perform with both hand(s) but spend more time on Rt hand. Perform using big movements.   Flipping Cards: Place deck of cards on the table. Flip cards over by opening your hand big to grasp and then turn your palm up big (can tap back of hand on table). Make sure you're sitting up straight.    Deal cards: Hold 1/2 or whole deck in your hand. Use thumb to push card off top of deck with one big push. Practice thumb "flicks" either from one end of card to another or from inside of palm straight up (like you're hitchhiking)   Rotate ball with fingertips: Pick up with fingers/thumb and move as much as you can with each turn/movement (clockwise and counter-clockwise). Mark your starting position and feel free to start with a lid (in hand or on table) before going to a ball.   Rotate 2 golf balls in your hand: Both directions. Can do on table. Focus doing this with the left hand for now.    Pick up coins and place in coin bank or container: Pick up with big, intentional movements. Do not drag coin to the edge.   Pick up 5-10 coins one at a time and hold in palm. Then, move coins from palm to fingertips one at time and place in coin bank/container.   Practice writing: Slow down, write big, and focus on forming each letter. PWR! Hand Exercises Perform each exercise at least 10 repetitions 1x/day, and PWR! PUSH throughout the day when you are having trouble using your hands (picking up small objects, writing, eating, typing, buttoning, etc.). ** Make each movement big and deliberate; feel the movement.  PWR! UP: Fists to open fingers BIG  PWR! Rock:  Move wrists up and down [employer]! Twist: Twist palms up and down BIG  PWR! Step: Step your thumb to index finger while keeping other fingers straight. Flick fingers out BIG (thumb out/straighten fingers). Repeat with other fingers.  PWR! PUSH: Push hands out BIG.  Elbows straight, wrists up, fingers open and spread apart BIG.       Performing Daily Activities with Big Movements  Pick at least 2 activities a day and perform with BIG, DELIBERATE movements/effort.  Try different activities each day. This can make the activity easier and turn daily activities into exercise to prevent problems in the future!  If you are standing during the activity, make sure to keep feet apart and stand with good/big/posture.  Examples: Dressing  Pull-over shirt:  good posture, bring shirt to head (don't bring head down), deliberately push arms into sleeves Jacket:  stand with feet apart, deliberately push arms into sleeves Underwear/Pants/Shoes/Socks:  Sit, lean forward, and push each foot in deliberately 1 at a time Open hands to pull down shirt/put on socks/pull up pants--get more material in your hand  Buttoning - Open hands big before fastening each button, deliberate movement (angry buttons--push button through hole), unfasten by using pull-push method   Bathing - Wash/dry with long strokes  Brushing your teeth - Big, slow movements  Cutting food - Long deliberate cuts, put tip of knife down in front of food  Eating - Hold utensil in the middle (not the end), hold fork straight up and down to stab food  Picking up a cup/bottle - Open hand up big and get object all the way in palm  Opening jar/bottle - Move as much  as you can with each turn, twist wrist  Putting on seatbelt - Feet apart, twist when reaching across body, look at where you are reaching  Hanging up clothes/getting clothes down from closet - Reach with big effort, open hand, straighten elbow  Putting away groceries/dishes - Reach with big effort  Wiping counter/table - Move in big, long strokes, open hand  Stirring while cooking - Exaggerate movement  Cleaning windows - Feet apart, move in big, long strokes  Sweeping/Raking- Feet apart and one foot in front of the other, move arms in  big, long strokes  Vacuuming - Feet apart and one in front of the other, push with big movement  Folding clothes - Exaggerate arm movements  Washing car - Move in big, long strokes, feet apart  Changing light bulb or Using a screwdriver - Move as much as you can with each turn, twist wrist  Walking into a store/restaurant - Walk with big steps, good posture, swing arms if able  Standing up from a chair/recliner/sofa - Scoot forward, lean forward, and stand with big effort  Picking up something from floor/reaching in low cabinet - Get close to object, position feet apart and one in front of the other

## 2023-11-16 NOTE — Therapy (Signed)
 OUTPATIENT PHYSICAL THERAPY NEURO TREATMENT   Patient Name: Joshua [name] MRN: [id number] DOB:02-05-1945, 78 y.o., male Today's Date: 11/16/2023   PCP: Loreli Elsie JONETTA Mickey., MD REFERRING PROVIDER: Loreli Elsie JONETTA Mickey., MD  END OF SESSION:  PT End of Session - 11/16/23 1151     Visit Number 6    Number of Visits 17   Plus eval   Date for Recertification  01/05/24    Authorization Type [employer]    PT Start Time 1150    PT Stop Time 1229    PT Time Calculation (min) 39 min    Equipment Utilized During Treatment Gait belt    Activity Tolerance Patient tolerated treatment well    Behavior During Therapy [facility] for tasks assessed/performed           Past Medical History:  Diagnosis Date   Allergic rhinitis    Anxiety    from chronic pain from surgery- on Cymbalta    Arthritis    Asthma    Barrett's esophagus 03/29/2014   Carotid artery disease    Carotid Doppler normal August, 2007   Coronary artery disease    Diverticulosis    Dyslipidemia    GERD (gastroesophageal reflux disease)    History of loop recorder    has since 04/04/15   HTN (hypertension)    takes Metoprolol  for PVC control   Hx of colonic polyps    adenomatous   IFG (impaired fasting glucose)    Myocardial infarction (HCC)    mild   Neuromuscular disorder (HCC)    parkinson's tremors in hands   Palpitations    Benign PVCs   Parkinson disease (HCC)    Pneumonia    Prostate cancer (HCC)    RBBB (right bundle branch block)    rate related   Shingles    Stroke [facility]) 2017   January 01, 2023,right hand weakness,   TIA (transient ischemic attack) 02/2015   Per pt, had 2 strokes   Vertigo    Past Surgical History:  Procedure Laterality Date   BACK SURGERY  2002,2009   x 6   CHOLECYSTECTOMY  11/30/2012   with IOC   COLONOSCOPY     ELECTROPHYSIOLOGIC STUDY N/A 09/26/2015   Procedure: V Tach Ablation (PVC);  Surgeon: Will Gladis Norton, MD;  Location: [facility] INVASIVE CV LAB;  Service:  Cardiovascular;  Laterality: N/A;   EP IMPLANTABLE DEVICE N/A 04/04/2015   Procedure: Loop Recorder Insertion;  Surgeon: Will Gladis Norton, MD;  Location: [facility] INVASIVE CV [facility];  Service: Cardiovascular;  Laterality: N/A;   HERNIA REPAIR     laprascopic   KNEE SURGERY     DECEMBER 2017,LEFT KNEE SCOPED   LEFT HEART CATH AND CORONARY ANGIOGRAPHY N/A 02/05/2021   Procedure: LEFT HEART CATH AND CORONARY ANGIOGRAPHY;  Surgeon: Darron Deatrice LABOR, MD;  Location: [facility] INVASIVE CV [facility];  Service: Cardiovascular;  Laterality: N/A;   NECK SURGERY  2002   POLYPECTOMY     ROTATOR CUFF REPAIR Left    TEE WITHOUT CARDIOVERSION N/A 04/04/2015   Procedure: TRANSESOPHAGEAL ECHOCARDIOGRAM (TEE);  Surgeon: Redell GORMAN Shallow, MD;  Location: [facility] ENDOSCOPY;  Service: Cardiovascular;  Laterality: N/A;   TOTAL KNEE ARTHROPLASTY Left 09/28/2022   Procedure: TOTAL KNEE ARTHROPLASTY;  Surgeon: Edna Toribio LABOR, MD;  Location: [facility] ORS;  Service: Orthopedics;  Laterality: Left;   TOTAL KNEE ARTHROPLASTY Right 01/11/2023   Procedure: TOTAL KNEE ARTHROPLASTY;  Surgeon: Edna Toribio LABOR, MD;  Location: [facility] ORS;  Service: Orthopedics;  Laterality: Right;   TRIGGER FINGER RELEASE Left 12/28/2019   Procedure: RELEASE TRIGGER FINGER/A-1 PULLEY THUMB, MIDDLE AND RING;  Surgeon: Murrell Kuba, MD;  Location: [facility];  Service: Orthopedics;  Laterality: Left;  FAB   UPPER GASTROINTESTINAL ENDOSCOPY     V Tach ablation  09/26/2015   Patient Active Problem List   Diagnosis Date Noted   CVA (cerebral vascular accident) (HCC) 01/15/2023   Primary osteoarthritis of right knee 01/11/2023   Localized osteoarthritis of right knee 01/11/2023   Primary osteoarthritis of left knee 09/28/2022   SBO (small bowel obstruction) (HCC) 05/29/2022   Chest pain 05/26/2022   Epigastric abdominal pain 05/26/2022   Coronary artery disease involving native coronary artery of native heart without angina pectoris 05/28/2021    History of myocardial infarction 02/05/2021   Parkinson's disease (HCC)    Malignant neoplasm of prostate (HCC) 11/01/2017   Essential tremor 12/06/2015   PVC (premature ventricular contraction) 09/26/2015   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) 04/07/2015   Acute CVA (cerebrovascular accident) (HCC) 03/06/2015   Stroke (cerebrum) (HCC) 03/05/2015   Cerebral infarction (HCC) 03/05/2015   Weakness of right upper extremity    Tremor    Ejection fraction    Vertigo    Sleep apnea    Palpitations    Hyperlipidemia with target LDL less than 70    GERD (gastroesophageal reflux disease)    HTN (hypertension)    Chest pain    RBBB (right bundle branch block)    Ejection fraction    Carotid artery disease (HCC)     ONSET DATE: 10/15/2023 (referral)   REFERRING DIAG: R29.6 (ICD-10-CM) - Recurrent falls  THERAPY DIAG:  Muscle weakness (generalized)  Other symptoms and signs involving the nervous system  Unsteadiness on feet  Abnormal posture  Rationale for Evaluation and Treatment: Rehabilitation  SUBJECTIVE:                                                                                                                                                                                             SUBJECTIVE STATEMENT:  Reports has been having dry eyes that has been affecting his vision and wondering if this is affecting his balance. Reports it will also give him some blurred vision and it is hard to read. Called his ophthalmologist about this and he gave some drops and a clear tear drop in his eyes. No falls.   Pt accompanied by: self  PERTINENT HISTORY: Rt TKA 01/11/23 with subsequent acute Lt CVA affecting Rt side, PD, Anxiety, HTN, HLD, multiple back surgeries/fusion neck and lumbar spine,  NSTEMI,  01/2020, hx of multiple syncopal episodes, CVA in 2017  PAIN:  Are you having pain? Yes: NPRS scale: 6/10 Pain location: low back (L2)  Pain description: Achy   Aggravating factors: Too much movement  Relieving factors: Ablation, medications   PRECAUTIONS: Fall  RED FLAGS: None   WEIGHT BEARING RESTRICTIONS: No  FALLS: Has patient fallen in last 6 months? Yes. Number of falls 3  LIVING ENVIRONMENT: Lives with: lives with their spouse Lives in: House/apartment Stairs: Yes: External: 1 steps; none Has following equipment at home: Single point cane, Walker - 2 wheeled, Environmental Consultant - 4 wheeled, shower chair, Grab bars, and [employer] and transport chair  PLOF: Independent  PATIENT GOALS: Just to work on my balance   OBJECTIVE:  Note: Objective measures were completed at Evaluation unless otherwise noted.  DIAGNOSTIC FINDINGS: MRI of Lumbar spine from 05/15/23  IMPRESSION: 1. Operative changes of posterior fusion from L3 through S1. Interbody fusion at these levels. Prior laminotomies at L3-L4 and L5-S1. 2. Moderate foraminal stenoses on the right at L3-L4 and bilaterally at L4-L5. 3. Mild canal stenosis at L2-L3 and L3-L4.  CT of head from 09/10/23 CT CERVICAL SPINE FINDINGS   Alignment: Mild anterolisthesis of C2 on C3 and C4 on C5 is degenerate etiology and unchanged from the prior. No traumatic malalignment.   Skull base and vertebrae: No evidence of acute fracture. Vertebral body heights are maintained. Solid C5-C6 ACDF.   Soft tissues and spinal canal: No prevertebral fluid or swelling. No visible canal hematoma.   Disc levels: Similar multilevel degenerative change.   Upper chest: Visualized lung apices are clear.   IMPRESSION: No evidence of acute abnormality intracranially or in the cervical spine.  COGNITION: Overall cognitive status: Within functional limits for tasks assessed   SENSATION: Pt reports bilateral numbness/tingling in distal BLEs from feet to knees    POSTURE: rounded shoulders, forward head, increased thoracic kyphosis, posterior pelvic tilt, and weight shift right  LOWER EXTREMITY ROM:      Active  Right Eval Left Eval  Hip flexion    Hip extension    Hip abduction    Hip adduction    Hip internal rotation    Hip external rotation    Knee flexion    Knee extension    Ankle dorsiflexion    Ankle plantarflexion    Ankle inversion    Ankle eversion     (Blank rows = not tested)  LOWER EXTREMITY MMT:    MMT Right Eval Left Eval  Hip flexion    Hip extension    Hip abduction    Hip adduction    Hip internal rotation    Hip external rotation    Knee flexion    Knee extension    Ankle dorsiflexion    Ankle plantarflexion    Ankle inversion    Ankle eversion    (Blank rows = not tested)  BED MOBILITY:  Not tested Pt reports difficulty getting out of the bed due to back pain and has increased difficulty getting out on the R side   TRANSFERS: Sit to stand: SBA  Assistive device utilized: None     Stand to sit: SBA  Assistive device utilized: None      RAMP:  Not tested  CURB:  Not tested  STAIRS: Not tested GAIT: Gait pattern: step through pattern, decreased step length- Right, decreased stance time- Left, decreased stride length, decreased ankle dorsiflexion- Right, knee flexed in stance- Right, knee flexed in stance-  Left, lateral hip instability, lateral lean- Right, decreased trunk rotation, trunk flexed, and poor foot clearance- Right Distance walked: Various clinic distances  Assistive device utilized: Single point cane and None Level of assistance: SBA Comments: Pt presented to session carrying SPC but did not use it during session. Noted continued truncal lean to R side but no overt LOB noted.    VITALS  Vitals:   11/16/23 1156 11/16/23 1157 11/16/23 1227  BP: 97/63 (!) 98/57 110/66  Pulse: 77 80 84                                                                                                                                    TREATMENT:    Self-care  Vitals:   11/16/23 1156 11/16/23 1157 11/16/23 1227  BP: 97/63 (!) 98/57  110/66  Pulse: 77 80 84   Seated, Standing Pt reporting no lightheadedness, did take Midodrine  this morning. Pt reports he has been drinking plenty of fluids   Also assessed in standing again at end of session and pt's BP incr   Pt reports he has compression socks at home and wore them on Saturday when he had to do a lot of walking, discussed can try to wear them to next Friday's PT session to see how they help with pt's BP    NMR On thicker blue foam: Sit <> stands with PWR Up in standing 5 reps, an additional 5 reps with heel raise once standing up, pt significantly challenged with this and with decr ROM and pt with incr weight shift to the R side, CGA for balance  Performed 5 sit <> stands with 6# medicine ball and toss to floor and catch - with cued for big reach overhead and then tossing to the ground, an additional 10 reps with pt naming foods in alphabetical order from A-Z, pt significantly challenged with this and unable to perform movement and cognitive task at the same time, PT needing to provide intermittent cues of what food to name, when going to catch ball cued to catch it with both hands as pt with tendency to try to catch it with just his L hand  On air ex: Following multi-directional stepping sheet  With feet directions only, working on stepping off and stepping back on, with intermittent cues for incr foot clearance when stepping, performed 2 sets with cues for naming directions out loudly  Performed an additional 3 reps with adding in coordinated arm movements (moving BUE in direction of stepping), pt frequently mixing up direction of reaching, did improve with incr reps  On rockerboard in A/P direction: Alternating forward cone taps to work on weight shifting/foot clearance, 12 reps each side, pt able to perform with intermittent UE support  Then performed a forward step up and immediately trying to tap cone, 10 reps each side, pt needing fingertip support  CGA for balance       PATIENT  EDUCATION: Education details: see self-care, addition of multi-directional stepping sheet to HEP to work on stepping strategies/weight shifting   Person educated: Patient Education method: Programmer, Multimedia, Facilities Manager, Verbal cues, and Handouts Education comprehension: verbalized understanding, returned demonstration, verbal cues required, and needs further education  HOME EXERCISE PROGRAM: From previous POC:  Standing PWR moves Multi-directional stepping sheet  Pt also has a stretching program he has at home.    Access Code: [email or web address] URL: [email or web address] Date: 11/09/2023 Prepared by: Marlon Plaster  Exercises - Heel Toe Raises with Counter Support  - 1 x daily - 5 x weekly - 1-2 sets - 10 reps - Standing Single Leg Stance with Counter Support  - 1 x daily - 5 x weekly - 3 sets - 10-15 hold - Sidelying Open Book Thoracic Lumbar Rotation and Extension  - 1 x daily - 7 x weekly - 3 sets - 10 reps - Standing Near Stance in Corner with Eyes Closed  - 1 x daily - 7 x weekly - 3 sets - 30-45 seconds  hold  GOALS: Goals reviewed with patient? Yes  SHORT TERM GOALS: Target date: 11/24/2023   Pt will improve miniBEST to at least a 22/28 in order to demo decr fall risk.  Baseline:20/28 Goal status: INITIAL  2.  Pt will improve gait velocity to at least 2.7 ft/s w/LRAD for improved gait efficiency and independence   Baseline: 2.55 ft/s w/rollator  Goal status: INITIAL  3.  Pt will verbalize and demonstrate understanding of proper bed mobility techniques for improved independence and efficiency w/bed mobility at home  Baseline:  Goal status: INITIAL   LONG TERM GOALS: Target date: 12/22/2023   Pt will improve miniBEST to at least a 25/28 in order to demo decr fall risk. Baseline: 20/28 Goal status: INITIAL  2.  Pt will improve gait velocity to at least 3.0 ft/s w/LRAD for improved gait efficiency and independence  Baseline: 2.55 ft/s  w/rollator  Goal status: INITIAL   ASSESSMENT:  CLINICAL IMPRESSION: Pt with lower BP (see above) at start of session, but pt asymptomatic. Assessed at end of session with pt's BP incr. Today's skilled session focused on balance on unlevel surfaces with focus on weight shifting and stepping. When trying to add heel raise task to standing PWR Up, pt with significant weight shift to the R and limited mobility with heel raise. Pt reports that this is due to lack of sensation in LLE due to hx of surgeries. Added in cognitive dual task with sit <> stand ball toss with pt with difficulty with this and needs frequent cues from therapist of what to name. Will continue per POC.    OBJECTIVE IMPAIRMENTS: Abnormal gait, decreased activity tolerance, decreased balance, decreased coordination, decreased knowledge of use of DME, decreased mobility, difficulty walking, decreased strength, increased fascial restrictions, increased muscle spasms, impaired sensation, improper body mechanics, postural dysfunction, and pain  ACTIVITY LIMITATIONS: carrying, lifting, bending, standing, squatting, stairs, transfers, bed mobility, reach over head, locomotion level, and caring for others  PARTICIPATION LIMITATIONS: meal prep, cleaning, laundry, interpersonal relationship, shopping, community activity, and yard work  PERSONAL FACTORS: Fitness, Past/current experiences, and 1-2 comorbidities: CVA and PD are also affecting patient's functional outcome.   REHAB POTENTIAL: Good  CLINICAL DECISION MAKING: Evolving/moderate complexity  EVALUATION COMPLEXITY: Moderate  PLAN:  PT FREQUENCY: 2x/week  PT DURATION: 8 weeks  PLANNED INTERVENTIONS: 97164- PT Re-evaluation, 97750- Physical Performance Testing, 97110-Therapeutic exercises, 97530- Therapeutic activity, [id number]- Neuromuscular re-education, 97535- Self Care, [id number]- Manual therapy, 02883-  Gait training, 02886- Aquatic Therapy, [phone]- Electrical stimulation (manual),  [phone] (1-2 muscles), [phone] (3+ muscles)- Dry Needling, Patient/Family education, Balance training, Stair training, Joint mobilization, Spinal mobilization, Vestibular training, and DME instructions  PLAN FOR NEXT SESSION: Work on lateral weight shifting, functional strength of LLE, postural control. SLS stability. Bed mobility (gets out on R side) Balance on unlevel surfaces    Sheffield LOISE Senate, PT, DPT 11/16/2023, 1:09 PM

## 2023-11-16 NOTE — Therapy (Signed)
 OUTPATIENT OCCUPATIONAL THERAPY PARKINSON'S TREATMENT  Patient Name: Joshua [name] MRN: [id number] DOB:[birth year], 78 y.o., male Today's Date: 11/16/2023  PCP: Loreli Elsie JONETTA Mickey., MD  REFERRING PROVIDER: Evonnie Asberry RAMAN, DO  END OF SESSION:  OT End of Session - 11/16/23 1240     Visit Number 3    Number of Visits 17    Date for Recertification  01/21/24   currently only schedule until 11/26   Authorization Type [employer]    OT Start Time 1238    OT Stop Time 1316    OT Time Calculation (min) 38 min    Activity Tolerance Patient tolerated treatment well    Behavior During Therapy [facility] for tasks assessed/performed         Past Medical History:  Diagnosis Date   Allergic rhinitis    Anxiety    from chronic pain from surgery- on Cymbalta    Arthritis    Asthma    Barrett's esophagus 03/29/2014   Carotid artery disease    Carotid Doppler normal August, 2007   Coronary artery disease    Diverticulosis    Dyslipidemia    GERD (gastroesophageal reflux disease)    History of loop recorder    has since 04/04/15   HTN (hypertension)    takes Metoprolol  for PVC control   Hx of colonic polyps    adenomatous   IFG (impaired fasting glucose)    Myocardial infarction (HCC)    mild   Neuromuscular disorder (HCC)    parkinson's tremors in hands   Palpitations    Benign PVCs   Parkinson disease (HCC)    Pneumonia    Prostate cancer (HCC)    RBBB (right bundle branch block)    rate related   Shingles    Stroke [facility]) 2017   January 01, 2023,right hand weakness,   TIA (transient ischemic attack) 02/2015   Per pt, had 2 strokes   Vertigo    Past Surgical History:  Procedure Laterality Date   BACK SURGERY  2002,2009   x 6   CHOLECYSTECTOMY  11/30/2012   with IOC   COLONOSCOPY     ELECTROPHYSIOLOGIC STUDY N/A 09/26/2015   Procedure: V Tach Ablation (PVC);  Surgeon: Will Gladis Norton, MD;  Location: [facility] INVASIVE CV LAB;  Service: Cardiovascular;   Laterality: N/A;   EP IMPLANTABLE DEVICE N/A 04/04/2015   Procedure: Loop Recorder Insertion;  Surgeon: Will Gladis Norton, MD;  Location: [facility] INVASIVE CV LAB;  Service: Cardiovascular;  Laterality: N/A;   HERNIA REPAIR     laprascopic   KNEE SURGERY     DECEMBER 2017,LEFT KNEE SCOPED   LEFT HEART CATH AND CORONARY ANGIOGRAPHY N/A 02/05/2021   Procedure: LEFT HEART CATH AND CORONARY ANGIOGRAPHY;  Surgeon: Darron Deatrice LABOR, MD;  Location: [facility] INVASIVE CV LAB;  Service: Cardiovascular;  Laterality: N/A;   NECK SURGERY  2002   POLYPECTOMY     ROTATOR CUFF REPAIR Left    TEE WITHOUT CARDIOVERSION N/A 04/04/2015   Procedure: TRANSESOPHAGEAL ECHOCARDIOGRAM (TEE);  Surgeon: Redell RAMAN Shallow, MD;  Location: [facility] ENDOSCOPY;  Service: Cardiovascular;  Laterality: N/A;   TOTAL KNEE ARTHROPLASTY Left 09/28/2022   Procedure: TOTAL KNEE ARTHROPLASTY;  Surgeon: Edna Toribio LABOR, MD;  Location: [facility] ORS;  Service: Orthopedics;  Laterality: Left;   TOTAL KNEE ARTHROPLASTY Right 01/11/2023   Procedure: TOTAL KNEE ARTHROPLASTY;  Surgeon: Edna Toribio LABOR, MD;  Location: [facility] ORS;  Service: Orthopedics;  Laterality: Right;   TRIGGER FINGER RELEASE Left 12/28/2019  Procedure: RELEASE TRIGGER FINGER/A-1 PULLEY THUMB, MIDDLE AND RING;  Surgeon: Murrell Kuba, MD;  Location: [facility];  Service: Orthopedics;  Laterality: Left;  FAB   UPPER GASTROINTESTINAL ENDOSCOPY     V Tach ablation  09/26/2015   Patient Active Problem List   Diagnosis Date Noted   CVA (cerebral vascular accident) (HCC) 01/15/2023   Primary osteoarthritis of right knee 01/11/2023   Localized osteoarthritis of right knee 01/11/2023   Primary osteoarthritis of left knee 09/28/2022   SBO (small bowel obstruction) (HCC) 05/29/2022   Chest pain 05/26/2022   Epigastric abdominal pain 05/26/2022   Coronary artery disease involving native coronary artery of native heart without angina pectoris 05/28/2021   History of myocardial  infarction 02/05/2021   Parkinson's disease (HCC)    Malignant neoplasm of prostate (HCC) 11/01/2017   Essential tremor 12/06/2015   PVC (premature ventricular contraction) 09/26/2015   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) 04/07/2015   Acute CVA (cerebrovascular accident) (HCC) 03/06/2015   Stroke (cerebrum) (HCC) 03/05/2015   Cerebral infarction (HCC) 03/05/2015   Weakness of right upper extremity    Tremor    Ejection fraction    Vertigo    Sleep apnea    Palpitations    Hyperlipidemia with target LDL less than 70    GERD (gastroesophageal reflux disease)    HTN (hypertension)    Chest pain    RBBB (right bundle branch block)    Ejection fraction    Carotid artery disease (HCC)    ONSET DATE: 11/03/2023 (Date of referral)  REFERRING DIAG: G20.B1 (ICD-10-CM) - Parkinson's disease with dyskinesia, without mention of fluctuations   THERAPY DIAG:  Muscle weakness (generalized)  Other symptoms and signs involving the nervous system  Other lack of coordination  Other symptoms and signs involving the musculoskeletal system  Essential tremor  Rationale for Evaluation and Treatment: Rehabilitation  SUBJECTIVE:   SUBJECTIVE STATEMENT: He reports he still cannot find his PD binder. He has been completing exercises from YouTube (not initiated by OT).  Pt accompanied by: self  PERTINENT HISTORY: Rt TKA 01/11/23 with subsequent acute Lt CVA affecting Rt side, PD, Anxiety, HTN, HLD, multiple back surgeries/fusion neck and lumbar spine,  NSTEMI, 01/2020, hx of multiple syncopal episodes, CVA in 2017   PRECAUTIONS: Fall  WEIGHT BEARING RESTRICTIONS: No  PAIN:  Are you having pain? Yes: NPRS scale: 5/10 Pain location: back Pain description: pulled Aggravating factors: exercises Relieving factors: rest, medications  FALLS: Has patient fallen in last 6 months? Yes. Number of falls 3  LIVING ENVIRONMENT: Lives with: lives with their spouse Lives in:  House/apartment Stairs: Yes: External: 1 steps; none Has following equipment at home: Single point cane, Walker - 2 wheeled, Environmental Consultant - 4 wheeled, shower chair, Grab bars, and [employer] and transport chair  PLOF: Independent; horticulturist, commercial; driving   PATIENT GOALS: To improve dishes, folding laundry, and   OBJECTIVE:  Note: Objective measures were completed at Evaluation unless otherwise noted.  HAND DOMINANCE: Right  ADLs: Overall ADLs: mod I  Eating: setup for cutting salad  Equipment: hower seat with back, Grab bars, Walk in shower, Reacher, Sock aid, Long handled shoe horn, and Long handled sponge  IADLs: Shopping: supervision Light housekeeping: mod I Meal Prep: mod I Community mobility: driving mod I Medication management: mod I Financial management: 50/50 with wife Handwriting: 25% legible or less with pen vs pencil  MOBILITY STATUS: Needs Assist: uses SPC and Hx of falls  POSTURE COMMENTS:  rounded shoulders, forward head, increased thoracic kyphosis, posterior pelvic tilt, and weight shift right   ACTIVITY TOLERANCE: Activity tolerance: good to fair  FUNCTIONAL OUTCOME MEASURES: 11/12/2023: Fastening/unfastening 3 buttons: only able to button 2 buttons in 150 seconds  Physical performance test: PPT#2 (simulated eating) Right: 25 seconds; Left: 24 sec   PPT#4 (donning/doffing jacket): TBD  HAND FUNCTION: Grip strength: Right: 28.2 lbs  COORDINATION: 9 Hole Peg test: Right: 168 sec; Left: 57 sec Box and Blocks:  Right 25 blocks, Left 43 blocks  UE ROM:  WFL; chronic wrist injury with limited flexion and no ext.  UE MMT:   WFL  SENSATION: Moderate to severe paresthesias in R hand and fingers primarily but extends up to shoulder, worse since stroke   MUSCLE TONE: RUE: Rigidity; mild   COGNITION: Overall cognitive status: Within functional limits for tasks assessed  OBSERVATIONS: Bradykinesia and Postural tremors                                                                                                                     TREATMENT :  - Self-care/home management completed for duration as noted below including: OT assessed buttoning time with use of button hook. Time improved; however, he struggled using AD for unbuttoning. Patient was instructed to use the button hook for buttoning only and to use the pull-push method for unbuttoning.   Pt educated in using big, deliberate movements with B UE to decrease stiffness, increase digital ROM/extension, decrease tremulous motions and overall increase UE coordination for different fine motor activities including those listed on the BIG ADL strategies handout and coordination HEP from prior therapy episode as noted in pt instructions.    PATIENT EDUCATION: Education details: buttoning, coordination, PWR! Hands Person educated: Patient Education method: Explanation, Demonstration, Tactile cues, Verbal cues, and Handouts Education comprehension: verbalized understanding, returned demonstration, verbal cues required, tactile cues required, and needs further education  HOME EXERCISE PROGRAM: 11/12/2023: buttoning 11/16/2023: Big ADLs; coordination; PWR! Hands  GOALS:  SHORT TERM GOALS: Target date: 12/07/2023    Pt will be independent with PD specific HEP.  Baseline: not yet initiated Goal status: IN PROGRESS  2.  Pt will verbalize understanding of adapted strategies to maximize safety and independence with ADLs/IADLs.  Baseline: not yet initiated Goal status: INITIAL  3.  Pt will write a sentence with no significant decrease in size and maintain 50% legibility.  Baseline: <25% legibility Goal status: INITIAL  4.  Pt will demonstrate improved fine motor coordination for ADLs as evidenced by decreasing 9 hole peg test score for each hand by at least 8 seconds.  Baseline: Right: 168 sec; Left: 57 sec Goal status: INITIAL  5.  Pt will demonstrate improved ease with  fastening buttons as evidenced by improving 3 button/unbutton time to be able to complete full assessment in 85 seconds or less.  Baseline: only able to button 2 buttons in 150 seconds - pt reported unable to feel his fingertips at time of eval  11/16/2023: using button hook for buttoning and unbuttoning, pt able to complete in 102 seconds Goal status: INITIAL  LONG TERM GOALS: Target date: 01/21/2024    1.  Pt will verbalize understanding of ways to prevent future PD related complications and PD community resources.  Baseline: not yet initiated Goal status: INITIAL  2.  Pt will write a short paragraph with no significant decrease in size and maintain 75% legibility.  Baseline: <25% legibility Goal status: INITIAL  3.  Pt will verbalize understanding of ways to keep thinking skills sharp and ways to compensate for STM changes in the future.  Baseline: not yet initiated Goal status: INITIAL  4.  Pt will demonstrate improved ease with feeding as evidenced by decreasing PPT#2 by at least 4 seconds.  Baseline: Right: 25 seconds; Left: 24 sec  Goal status: INITIAL  5.  Pt will demonstrate increased ease with dressing as evidenced by decreasing PPT#4 (don/ doff jacket) to 20 secs or less.  Baseline: 30 seconds Goal status: INITIAL  6.  Pt will demonstrate improved fine motor coordination for ADLs as evidenced by decreasing 9 hole peg test score for each hand by at least 12 secs  Baseline: Right: 168 sec; Left: 57 sec Goal status: INITIAL   7.  Pt will be able to place at least 8 blocks using R hand with completion of Box and Blocks test.  Baseline:  Right 25 blocks, Left 43 blocks Goal status: INITIAL  ASSESSMENT:  CLINICAL IMPRESSION: Patient demonstrates good understanding of adaptive strategies for buttoning as needed to progress towards goals. Lacks carryover of therapy specific HEP. Continue to work toward POC.   PERFORMANCE DEFICITS: in functional skills including ADLs,  IADLs, coordination, dexterity, strength, Fine motor control, Gross motor control, mobility, decreased knowledge of precautions, decreased knowledge of use of DME, and UE functional use.   IMPAIRMENTS: are limiting patient from ADLs, IADLs, leisure, and social participation.   COMORBIDITIES:  may have co-morbidities  that affects occupational performance. Patient will benefit from skilled OT to address above impairments and improve overall function.  REHAB POTENTIAL: Good  PLAN:  OT FREQUENCY: 1-2x/week  OT DURATION: 8 weeks  PLANNED INTERVENTIONS: 97168 OT Re-evaluation, 97535 self care/ADL training, 02889 therapeutic exercise, 97530 therapeutic activity, 97112 neuromuscular re-education, 97750 Physical Performance Testing, functional mobility training, coping strategies training, patient/family education, and DME and/or AE instructions  RECOMMENDED OTHER SERVICES: N/A for this visit  CONSULTED AND AGREED WITH PLAN OF CARE: Patient  PLAN FOR NEXT SESSION: Assess buttoning (with button hook only for buttoning and push pull method for unbuttoning); review prior HEPs; writing   Jocelyn CHRISTELLA Bottom, OT 11/16/2023, 2:17 PM

## 2023-11-19 ENCOUNTER — Ambulatory Visit: Admitting: Occupational Therapy

## 2023-11-19 ENCOUNTER — Ambulatory Visit: Admitting: Physical Therapy

## 2023-11-19 VITALS — BP 105/58 | HR 74

## 2023-11-19 DIAGNOSIS — G250 Essential tremor: Secondary | ICD-10-CM

## 2023-11-19 DIAGNOSIS — M6281 Muscle weakness (generalized): Secondary | ICD-10-CM

## 2023-11-19 DIAGNOSIS — R29898 Other symptoms and signs involving the musculoskeletal system: Secondary | ICD-10-CM

## 2023-11-19 DIAGNOSIS — R29818 Other symptoms and signs involving the nervous system: Secondary | ICD-10-CM

## 2023-11-19 DIAGNOSIS — R293 Abnormal posture: Secondary | ICD-10-CM

## 2023-11-19 DIAGNOSIS — R2681 Unsteadiness on feet: Secondary | ICD-10-CM

## 2023-11-19 DIAGNOSIS — R278 Other lack of coordination: Secondary | ICD-10-CM

## 2023-11-19 NOTE — Patient Instructions (Addendum)
 Suggestions for Handwriting Changes Many people with Parkinson's notice changes in their handwriting.  Handwriting often becomes small and cramped, and can become more difficult to control when writing for longer periods of time.  This handwriting change is called micrographia.  Why does micrographia occur?  Parkinson's can cause slowing of movement, and feelings of muscle stiffness in the hands and fingers.  Loss of automatic motion also affects the easy, flowing motion of handwriting.  This can impact even simple writing tasks such as signing your name or writing a shopping list.  Attempts to write quickly without thinking about forming each letter contributes to small, cramped handwriting, and may cause the hand to develop a feeling of tightness.  How can I make writing easier?  Make a deliberate effort to form each letter.  This can be hard to do at first, but is very effective in improving size and legibility of handwriting.  Use a pen grip (round or triangular shaped rubber or foam cylinders available at stationery stores or where writing materials are found) or a larger size pen to keep your hand more relaxed.  Try printing rather than writing in a cursive style. Printing causes you to pause briefly between each letter, keeping writing more legible.   Using lined paper may provide a visual target to keep all letters big when writing.   A ballpoint pen typically works better than felt tip or rolling writer/gel styles.  Rest your hand if it begins to feel tight.  Pause briefly when you see your handwriting becoming smaller.  Avoid hurrying or trying to write long passages if you are feeling stressed or fatigued.  Practice helps!  Remind yourself to slow down, aim big, and pause often!  Perform flicks/PWR! Hands if your hand feels tight, your writing gets smaller, before you start writing, or if tremors increase.  Involving your team: An occupational therapist can provide  assessment and individual recommendations for improvement of your handwriting.  This handout was adapted from [email or web address] [employer] Parkinson foundation   [email or web address]  [email or web address].SMq5MNpAU6dzxR26X5CFczYjnfJsNLZ1iatbpaI0rzP0gF1keU7TndrMcjqe6u7R1C25UjNZyo0Fjva0j03TkaPIep0MTXyf7pXSlS2Xc0KIXLR4TpimFK_90HWh13xwpTmERy3Ho8Jy68uxypHLW4xgVlRcLF9g2LlRtUnLMLITmCClYO_eRC0HX8ajiYH5or8Sr10Qeq1W8MqrpfOjFEoLm11XhZMx3DHY1Rq9gVG3gEzrb1GjrNnNTjRhSLMf4BoUwz3xFro5-joRmq5NB_PzaqXrambh3qF4exLYmKU.9dDDZtJa22FJGQ56l5J11kVyzIPFLCiSE2yNBIZH0As&dib_tag=se&keywords=hunt+wilde+weighted+pen+holder&[phone]&sprefix=hunt+wilde+weighted+pen+holder%2Caps%2C97&sr=8-1

## 2023-11-19 NOTE — Therapy (Signed)
 OUTPATIENT OCCUPATIONAL THERAPY PARKINSON'S TREATMENT  Patient Name: Joshua [name] MRN: [id number] DOB:09/10/1945, 78 y.o., male Today's Date: 11/19/2023  PCP: Loreli Elsie JONETTA Mickey., MD  REFERRING PROVIDER: Evonnie Asberry RAMAN, DO  END OF SESSION:  OT End of Session - 11/19/23 1147     Visit Number 4    Number of Visits 17    Date for Recertification  01/21/24   currently only schedule until 11/26   Authorization Type [employer]    OT Start Time 1148    OT Stop Time 1226    OT Time Calculation (min) 38 min    Activity Tolerance Patient tolerated treatment well    Behavior During Therapy [facility] for tasks assessed/performed         Past Medical History:  Diagnosis Date   Allergic rhinitis    Anxiety    from chronic pain from surgery- on Cymbalta    Arthritis    Asthma    Barrett's esophagus 03/29/2014   Carotid artery disease    Carotid Doppler normal August, 2007   Coronary artery disease    Diverticulosis    Dyslipidemia    GERD (gastroesophageal reflux disease)    History of loop recorder    has since 04/04/15   HTN (hypertension)    takes Metoprolol  for PVC control   Hx of colonic polyps    adenomatous   IFG (impaired fasting glucose)    Myocardial infarction (HCC)    mild   Neuromuscular disorder (HCC)    parkinson's tremors in hands   Palpitations    Benign PVCs   Parkinson disease (HCC)    Pneumonia    Prostate cancer (HCC)    RBBB (right bundle branch block)    rate related   Shingles    Stroke [facility]) 2017   January 01, 2023,right hand weakness,   TIA (transient ischemic attack) 02/2015   Per pt, had 2 strokes   Vertigo    Past Surgical History:  Procedure Laterality Date   BACK SURGERY  2002,2009   x 6   CHOLECYSTECTOMY  11/30/2012   with IOC   COLONOSCOPY     ELECTROPHYSIOLOGIC STUDY N/A 09/26/2015   Procedure: V Tach Ablation (PVC);  Surgeon: Will Gladis Norton, MD;  Location: [facility] INVASIVE CV LAB;  Service: Cardiovascular;   Laterality: N/A;   EP IMPLANTABLE DEVICE N/A 04/04/2015   Procedure: Loop Recorder Insertion;  Surgeon: Will Gladis Norton, MD;  Location: [facility] INVASIVE CV [facility];  Service: Cardiovascular;  Laterality: N/A;   HERNIA REPAIR     laprascopic   KNEE SURGERY     DECEMBER 2017,LEFT KNEE SCOPED   LEFT HEART CATH AND CORONARY ANGIOGRAPHY N/A 02/05/2021   Procedure: LEFT HEART CATH AND CORONARY ANGIOGRAPHY;  Surgeon: Darron Deatrice LABOR, MD;  Location: [facility] INVASIVE CV LAB;  Service: Cardiovascular;  Laterality: N/A;   NECK SURGERY  2002   POLYPECTOMY     ROTATOR CUFF REPAIR Left    TEE WITHOUT CARDIOVERSION N/A 04/04/2015   Procedure: TRANSESOPHAGEAL ECHOCARDIOGRAM (TEE);  Surgeon: Redell RAMAN Shallow, MD;  Location: [facility] ENDOSCOPY;  Service: Cardiovascular;  Laterality: N/A;   TOTAL KNEE ARTHROPLASTY Left 09/28/2022   Procedure: TOTAL KNEE ARTHROPLASTY;  Surgeon: Edna Toribio LABOR, MD;  Location: [facility] ORS;  Service: Orthopedics;  Laterality: Left;   TOTAL KNEE ARTHROPLASTY Right 01/11/2023   Procedure: TOTAL KNEE ARTHROPLASTY;  Surgeon: Edna Toribio LABOR, MD;  Location: [facility] ORS;  Service: Orthopedics;  Laterality: Right;   TRIGGER FINGER RELEASE Left 12/28/2019  Procedure: RELEASE TRIGGER FINGER/A-1 PULLEY THUMB, MIDDLE AND RING;  Surgeon: Murrell Kuba, MD;  Location: [facility];  Service: Orthopedics;  Laterality: Left;  FAB   UPPER GASTROINTESTINAL ENDOSCOPY     V Tach ablation  09/26/2015   Patient Active Problem List   Diagnosis Date Noted   CVA (cerebral vascular accident) (HCC) 01/15/2023   Primary osteoarthritis of right knee 01/11/2023   Localized osteoarthritis of right knee 01/11/2023   Primary osteoarthritis of left knee 09/28/2022   SBO (small bowel obstruction) (HCC) 05/29/2022   Chest pain 05/26/2022   Epigastric abdominal pain 05/26/2022   Coronary artery disease involving native coronary artery of native heart without angina pectoris 05/28/2021   History of myocardial  infarction 02/05/2021   Parkinson's disease (HCC)    Malignant neoplasm of prostate (HCC) 11/01/2017   Essential tremor 12/06/2015   PVC (premature ventricular contraction) 09/26/2015   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) 04/07/2015   Acute CVA (cerebrovascular accident) (HCC) 03/06/2015   Stroke (cerebrum) (HCC) 03/05/2015   Cerebral infarction (HCC) 03/05/2015   Weakness of right upper extremity    Tremor    Ejection fraction    Vertigo    Sleep apnea    Palpitations    Hyperlipidemia with target LDL less than 70    GERD (gastroesophageal reflux disease)    HTN (hypertension)    Chest pain    RBBB (right bundle branch block)    Ejection fraction    Carotid artery disease (HCC)    ONSET DATE: 11/03/2023 (Date of referral)  REFERRING DIAG: G20.B1 (ICD-10-CM) - Parkinson's disease with dyskinesia, without mention of fluctuations   THERAPY DIAG:  Muscle weakness (generalized)  Other symptoms and signs involving the nervous system  Other lack of coordination  Other symptoms and signs involving the musculoskeletal system  Essential tremor  Rationale for Evaluation and Treatment: Rehabilitation  SUBJECTIVE:   SUBJECTIVE STATEMENT: He reports he still cannot find his PD binder. He has been completing exercises from YouTube (not initiated by OT).  Pt accompanied by: self  PERTINENT HISTORY: Rt TKA 01/11/23 with subsequent acute Lt CVA affecting Rt side, PD, Anxiety, HTN, HLD, multiple back surgeries/fusion neck and lumbar spine,  NSTEMI, 01/2020, hx of multiple syncopal episodes, CVA in 2017   PRECAUTIONS: Fall  WEIGHT BEARING RESTRICTIONS: No  PAIN:  Are you having pain? Yes: NPRS scale: 5/10 Pain location: back Pain description: pulled Aggravating factors: exercises Relieving factors: rest, medications  FALLS: Has patient fallen in last 6 months? Yes. Number of falls 3  LIVING ENVIRONMENT: Lives with: lives with their spouse Lives in:  House/apartment Stairs: Yes: External: 1 steps; none Has following equipment at home: Single point cane, Walker - 2 wheeled, Environmental Consultant - 4 wheeled, shower chair, Grab bars, and [employer] and transport chair  PLOF: Independent; horticulturist, commercial; driving   PATIENT GOALS: To improve dishes, folding laundry, and   OBJECTIVE:  Note: Objective measures were completed at Evaluation unless otherwise noted.  HAND DOMINANCE: Right  ADLs: Overall ADLs: mod I  Eating: setup for cutting salad  Equipment: hower seat with back, Grab bars, Walk in shower, Reacher, Sock aid, Long handled shoe horn, and Long handled sponge  IADLs: Shopping: supervision Light housekeeping: mod I Meal Prep: mod I Community mobility: driving mod I Medication management: mod I Financial management: 50/50 with wife Handwriting: 25% legible or less with pen vs pencil  MOBILITY STATUS: Needs Assist: uses SPC and Hx of falls  POSTURE COMMENTS:  rounded shoulders, forward head, increased thoracic kyphosis, posterior pelvic tilt, and weight shift right   ACTIVITY TOLERANCE: Activity tolerance: good to fair  FUNCTIONAL OUTCOME MEASURES: 11/12/2023: Fastening/unfastening 3 buttons: only able to button 2 buttons in 150 seconds  Physical performance test: PPT#2 (simulated eating) Right: 25 seconds; Left: 24 sec   PPT#4 (donning/doffing jacket): TBD  HAND FUNCTION: Grip strength: Right: 28.2 lbs  COORDINATION: 9 Hole Peg test: Right: 168 sec; Left: 57 sec Box and Blocks:  Right 25 blocks, Left 43 blocks  UE ROM:  WFL; chronic wrist injury with limited flexion and no ext.  UE MMT:   WFL  SENSATION: Moderate to severe paresthesias in R hand and fingers primarily but extends up to shoulder, worse since stroke   MUSCLE TONE: RUE: Rigidity; mild   COGNITION: Overall cognitive status: Within functional limits for tasks assessed  OBSERVATIONS: Bradykinesia and Postural tremors                                                                                                                     TREATMENT :  - Self-care/home management completed for duration as noted below including: OT educated pt on compensatory strategies for micrographia related to Parkinson's disease. Education was provided using the handout as noted in pt instructions titled "Suggestions for Handwriting Changes" (adapted from [email or web address]). Topics reviewed included: Causes of micrographia (slowed movement, stiffness, loss of automaticity) Strategies to improve legibility (e.g., deliberate letter formation, use of pen grips, lined paper, printing vs. cursive) Environmental and behavioral modifications (e.g., rest breaks, avoiding fatigue, using ballpoint pens) Therapeutic exercises (e.g., PWR! Hands/flicks) Pt practiced writing using lined paper and a pen with a built-up grip, pen with weighted built-up grip, marker, and fine tip felt marker. Demonstrated improved letter size and spacing with verbal cues (slower pacing) and visual feedback, especially with pen with weighted built-up grip. Pt also utilized 3 lb wrist weight with built up pen with improvement noted as well secondary to pronounced tremor on R. OT educated pt on how to obtain both options for home use.   PATIENT EDUCATION: Education details: writing/ tremor reduction Person educated: Patient Education method: Explanation, Demonstration, Tactile cues, Verbal cues, and Handouts Education comprehension: verbalized understanding, returned demonstration, verbal cues required, tactile cues required, and needs further education  HOME EXERCISE PROGRAM: 11/12/2023: buttoning 11/16/2023: Big ADLs; coordination; PWR! Hands  GOALS:  SHORT TERM GOALS: Target date: 12/07/2023    Pt will be independent with PD specific HEP.  Baseline: not yet initiated Goal status: IN PROGRESS  2.  Pt will verbalize understanding of adapted strategies to maximize safety and independence  with ADLs/IADLs.  Baseline: not yet initiated Goal status: INITIAL  3.  Pt will write a sentence with no significant decrease in size and maintain 50% legibility.  Baseline: <25% legibility Goal status: INITIAL  4.  Pt will demonstrate improved fine motor coordination for ADLs as evidenced by decreasing 9 hole peg test score for each hand by at least 8  seconds.  Baseline: Right: 168 sec; Left: 57 sec Goal status: INITIAL  5.  Pt will demonstrate improved ease with fastening buttons as evidenced by improving 3 button/unbutton time to be able to complete full assessment in 85 seconds or less.  Baseline: only able to button 2 buttons in 150 seconds - pt reported unable to feel his fingertips at time of eval 11/16/2023: using button hook for buttoning and unbuttoning, pt able to complete in 102 seconds Goal status: INITIAL  LONG TERM GOALS: Target date: 01/21/2024    1.  Pt will verbalize understanding of ways to prevent future PD related complications and PD community resources.  Baseline: not yet initiated Goal status: INITIAL  2.  Pt will write a short paragraph with no significant decrease in size and maintain 75% legibility.  Baseline: <25% legibility Goal status: INITIAL  3.  Pt will verbalize understanding of ways to keep thinking skills sharp and ways to compensate for STM changes in the future.  Baseline: not yet initiated Goal status: INITIAL  4.  Pt will demonstrate improved ease with feeding as evidenced by decreasing PPT#2 by at least 4 seconds.  Baseline: Right: 25 seconds; Left: 24 sec  Goal status: INITIAL  5.  Pt will demonstrate increased ease with dressing as evidenced by decreasing PPT#4 (don/ doff jacket) to 20 secs or less.  Baseline: 30 seconds Goal status: INITIAL  6.  Pt will demonstrate improved fine motor coordination for ADLs as evidenced by decreasing 9 hole peg test score for each hand by at least 12 secs  Baseline: Right: 168 sec; Left: 57  sec Goal status: INITIAL   7.  Pt will be able to place at least 8 blocks using R hand with completion of Box and Blocks test.  Baseline:  Right 25 blocks, Left 43 blocks Goal status: INITIAL  ASSESSMENT:  CLINICAL IMPRESSION: Handwriting changes consistent with micrographia secondary to Parkinson's disease. Tremor and bradykinesia contribute to reduced motor control and increased effort during writing tasks. Patient would benefit from compensatory strategies and therapeutic exercises to improve legibility and reduce fatigue.  PERFORMANCE DEFICITS: in functional skills including ADLs, IADLs, coordination, dexterity, strength, Fine motor control, Gross motor control, mobility, decreased knowledge of precautions, decreased knowledge of use of DME, and UE functional use.   IMPAIRMENTS: are limiting patient from ADLs, IADLs, leisure, and social participation.   COMORBIDITIES:  may have co-morbidities  that affects occupational performance. Patient will benefit from skilled OT to address above impairments and improve overall function.  REHAB POTENTIAL: Good  PLAN:  OT FREQUENCY: 1-2x/week  OT DURATION: 8 weeks  PLANNED INTERVENTIONS: 97168 OT Re-evaluation, 97535 self care/ADL training, 02889 therapeutic exercise, 97530 therapeutic activity, 97112 neuromuscular re-education, 97750 Physical Performance Testing, functional mobility training, coping strategies training, patient/family education, and DME and/or AE instructions  RECOMMENDED OTHER SERVICES: N/A for this visit  CONSULTED AND AGREED WITH PLAN OF CARE: Patient  PLAN FOR NEXT SESSION: Assess buttoning (with button hook only for buttoning and push pull method for unbuttoning); review prior HEPs   Jocelyn CHRISTELLA Bottom, OT 11/19/2023, 12:51 PM

## 2023-11-19 NOTE — Therapy (Signed)
 OUTPATIENT PHYSICAL THERAPY NEURO TREATMENT   Patient Name: Joshua [name] MRN: [id number] DOB:September 10, 1945, 78 y.o., male Today's Date: 11/19/2023   PCP: Loreli Elsie JONETTA Mickey., MD REFERRING PROVIDER: Loreli Elsie JONETTA Mickey., MD  END OF SESSION:  PT End of Session - 11/19/23 1203     Visit Number 7    Number of Visits 17   Plus eval   Date for Recertification  01/05/24    Authorization Type [employer]    PT Start Time 1102    PT Stop Time 1145    PT Time Calculation (min) 43 min    Equipment Utilized During Treatment Gait belt    Activity Tolerance Patient tolerated treatment well    Behavior During Therapy [facility] for tasks assessed/performed            Past Medical History:  Diagnosis Date   Allergic rhinitis    Anxiety    from chronic pain from surgery- on Cymbalta    Arthritis    Asthma    Barrett's esophagus 03/29/2014   Carotid artery disease    Carotid Doppler normal August, 2007   Coronary artery disease    Diverticulosis    Dyslipidemia    GERD (gastroesophageal reflux disease)    History of loop recorder    has since 04/04/15   HTN (hypertension)    takes Metoprolol  for PVC control   Hx of colonic polyps    adenomatous   IFG (impaired fasting glucose)    Myocardial infarction (HCC)    mild   Neuromuscular disorder (HCC)    parkinson's tremors in hands   Palpitations    Benign PVCs   Parkinson disease (HCC)    Pneumonia    Prostate cancer (HCC)    RBBB (right bundle branch block)    rate related   Shingles    Stroke [facility]) 2017   January 01, 2023,right hand weakness,   TIA (transient ischemic attack) 02/2015   Per pt, had 2 strokes   Vertigo    Past Surgical History:  Procedure Laterality Date   BACK SURGERY  2002,2009   x 6   CHOLECYSTECTOMY  11/30/2012   with IOC   COLONOSCOPY     ELECTROPHYSIOLOGIC STUDY N/A 09/26/2015   Procedure: V Tach Ablation (PVC);  Surgeon: Will Gladis Norton, MD;  Location: [facility] INVASIVE CV LAB;  Service:  Cardiovascular;  Laterality: N/A;   EP IMPLANTABLE DEVICE N/A 04/04/2015   Procedure: Loop Recorder Insertion;  Surgeon: Will Gladis Norton, MD;  Location: [facility] INVASIVE CV LAB;  Service: Cardiovascular;  Laterality: N/A;   HERNIA REPAIR     laprascopic   KNEE SURGERY     DECEMBER 2017,LEFT KNEE SCOPED   LEFT HEART CATH AND CORONARY ANGIOGRAPHY N/A 02/05/2021   Procedure: LEFT HEART CATH AND CORONARY ANGIOGRAPHY;  Surgeon: Darron Deatrice LABOR, MD;  Location: [facility] INVASIVE CV LAB;  Service: Cardiovascular;  Laterality: N/A;   NECK SURGERY  2002   POLYPECTOMY     ROTATOR CUFF REPAIR Left    TEE WITHOUT CARDIOVERSION N/A 04/04/2015   Procedure: TRANSESOPHAGEAL ECHOCARDIOGRAM (TEE);  Surgeon: Redell GORMAN Shallow, MD;  Location: [facility] ENDOSCOPY;  Service: Cardiovascular;  Laterality: N/A;   TOTAL KNEE ARTHROPLASTY Left 09/28/2022   Procedure: TOTAL KNEE ARTHROPLASTY;  Surgeon: Edna Toribio LABOR, MD;  Location: [facility] ORS;  Service: Orthopedics;  Laterality: Left;   TOTAL KNEE ARTHROPLASTY Right 01/11/2023   Procedure: TOTAL KNEE ARTHROPLASTY;  Surgeon: Edna Toribio LABOR, MD;  Location: [facility] ORS;  Service:  Orthopedics;  Laterality: Right;   TRIGGER FINGER RELEASE Left 12/28/2019   Procedure: RELEASE TRIGGER FINGER/A-1 PULLEY THUMB, MIDDLE AND RING;  Surgeon: Murrell Kuba, MD;  Location: [facility];  Service: Orthopedics;  Laterality: Left;  FAB   UPPER GASTROINTESTINAL ENDOSCOPY     V Tach ablation  09/26/2015   Patient Active Problem List   Diagnosis Date Noted   CVA (cerebral vascular accident) (HCC) 01/15/2023   Primary osteoarthritis of right knee 01/11/2023   Localized osteoarthritis of right knee 01/11/2023   Primary osteoarthritis of left knee 09/28/2022   SBO (small bowel obstruction) (HCC) 05/29/2022   Chest pain 05/26/2022   Epigastric abdominal pain 05/26/2022   Coronary artery disease involving native coronary artery of native heart without angina pectoris 05/28/2021    History of myocardial infarction 02/05/2021   Parkinson's disease (HCC)    Malignant neoplasm of prostate (HCC) 11/01/2017   Essential tremor 12/06/2015   PVC (premature ventricular contraction) 09/26/2015   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) 04/07/2015   Acute CVA (cerebrovascular accident) (HCC) 03/06/2015   Stroke (cerebrum) (HCC) 03/05/2015   Cerebral infarction (HCC) 03/05/2015   Weakness of right upper extremity    Tremor    Ejection fraction    Vertigo    Sleep apnea    Palpitations    Hyperlipidemia with target LDL less than 70    GERD (gastroesophageal reflux disease)    HTN (hypertension)    Chest pain    RBBB (right bundle branch block)    Ejection fraction    Carotid artery disease (HCC)     ONSET DATE: 10/15/2023 (referral)   REFERRING DIAG: R29.6 (ICD-10-CM) - Recurrent falls  THERAPY DIAG:  Muscle weakness (generalized)  Other symptoms and signs involving the nervous system  Unsteadiness on feet  Abnormal posture  Rationale for Evaluation and Treatment: Rehabilitation  SUBJECTIVE:                                                                                                                                                                                             SUBJECTIVE STATEMENT:  Wearing regular socks that are tight because he threw away compression socks. Patient feels good over all just knees a little tight from the other day. Patient denies falls.  Pt accompanied by: self  PERTINENT HISTORY: Rt TKA 01/11/23 with subsequent acute Lt CVA affecting Rt side, PD, Anxiety, HTN, HLD, multiple back surgeries/fusion neck and lumbar spine,  NSTEMI, 01/2020, hx of multiple syncopal episodes, CVA in 2017  PAIN:  Are you having pain? Yes: NPRS scale: 6/10 Pain location: low back (L2)  Pain description: Achy  Aggravating factors: Too much movement  Relieving factors: Ablation, medications   PRECAUTIONS: Fall  RED  FLAGS: None   WEIGHT BEARING RESTRICTIONS: No  FALLS: Has patient fallen in last 6 months? Yes. Number of falls 3  LIVING ENVIRONMENT: Lives with: lives with their spouse Lives in: House/apartment Stairs: Yes: External: 1 steps; none Has following equipment at home: Single point cane, Walker - 2 wheeled, Environmental Consultant - 4 wheeled, shower chair, Grab bars, and [employer] and transport chair  PLOF: Independent  PATIENT GOALS: Just to work on my balance   OBJECTIVE:  Note: Objective measures were completed at Evaluation unless otherwise noted.  DIAGNOSTIC FINDINGS: MRI of Lumbar spine from 05/15/23  IMPRESSION: 1. Operative changes of posterior fusion from L3 through S1. Interbody fusion at these levels. Prior laminotomies at L3-L4 and L5-S1. 2. Moderate foraminal stenoses on the right at L3-L4 and bilaterally at L4-L5. 3. Mild canal stenosis at L2-L3 and L3-L4.  CT of head from 09/10/23 CT CERVICAL SPINE FINDINGS   Alignment: Mild anterolisthesis of C2 on C3 and C4 on C5 is degenerate etiology and unchanged from the prior. No traumatic malalignment.   Skull base and vertebrae: No evidence of acute fracture. Vertebral body heights are maintained. Solid C5-C6 ACDF.   Soft tissues and spinal canal: No prevertebral fluid or swelling. No visible canal hematoma.   Disc levels: Similar multilevel degenerative change.   Upper chest: Visualized lung apices are clear.   IMPRESSION: No evidence of acute abnormality intracranially or in the cervical spine.  COGNITION: Overall cognitive status: Within functional limits for tasks assessed   SENSATION: Pt reports bilateral numbness/tingling in distal BLEs from feet to knees    POSTURE: rounded shoulders, forward head, increased thoracic kyphosis, posterior pelvic tilt, and weight shift right  LOWER EXTREMITY ROM:     Active  Right Eval Left Eval  Hip flexion    Hip extension    Hip abduction    Hip adduction    Hip  internal rotation    Hip external rotation    Knee flexion    Knee extension    Ankle dorsiflexion    Ankle plantarflexion    Ankle inversion    Ankle eversion     (Blank rows = not tested)  LOWER EXTREMITY MMT:    MMT Right Eval Left Eval  Hip flexion    Hip extension    Hip abduction    Hip adduction    Hip internal rotation    Hip external rotation    Knee flexion    Knee extension    Ankle dorsiflexion    Ankle plantarflexion    Ankle inversion    Ankle eversion    (Blank rows = not tested)  BED MOBILITY:  Not tested Pt reports difficulty getting out of the bed due to back pain and has increased difficulty getting out on the R side   TRANSFERS: Sit to stand: SBA  Assistive device utilized: None     Stand to sit: SBA  Assistive device utilized: None      RAMP:  Not tested  CURB:  Not tested  STAIRS: Not tested GAIT: Gait pattern: step through pattern, decreased step length- Right, decreased stance time- Left, decreased stride length, decreased ankle dorsiflexion- Right, knee flexed in stance- Right, knee flexed in stance- Left, lateral hip instability, lateral lean- Right, decreased trunk rotation, trunk flexed, and poor foot clearance- Right Distance walked: Various clinic distances  Assistive device utilized:  Single point cane and None Level of assistance: SBA Comments: Pt presented to session carrying SPC but did not use it during session. Noted continued truncal lean to R side but no overt LOB noted.    VITALS  Vitals:   11/19/23 1106 11/19/23 1108  BP: 111/69 (!) 105/58  Pulse: 72 74                                                                                                                                     TREATMENT:    Self-care  Vitals:   11/19/23 1106 11/19/23 1108  BP: 111/69 (!) 105/58  Pulse: 72 74    Seated, Standing and WNL for session Pt reporting no lightheadedness and no other symptoms today.  Pt wears socks to  clinic but they appear to be tight knitted socks and not compression socks. Patient stated throwing away compression socks and was encourage to use compression socks instead of regular knitted socks. Patient continued to be encouraged to follow up with his doctor regarding BP.  Reviewed stepping strategies on foam at home, and that he can complete at home as long as he has something nearby that he can hold on for UE support.    NMR On air ex: Steps with sign - directions written with different colors, colors called out and directions performed  Toe taps at steps, 2 sets x 20 taps with random color call out 4 circle pads on steps at different heights and calling out random color to tap with foot Set 1: must tap with foot on same side of color Set 2: must tap with opposite foot *Moderate assistance needed due to loss of balance during balance tasks and difficulty clearing foot On foam balance beam in parallel bars: Side stepping down and back x 2 rounds, min assist needed due to loss of balance Static balance on foam with letter ball toss and naming food that starts with found letter on ball, min assist needed due to loss of balance  PATIENT EDUCATION: Education details: see self-care, addition of multi-directional stepping sheet to HEP to work on stepping strategies/weight shifting   Person educated: Patient Education method: Programmer, Multimedia, Facilities Manager, Verbal cues, and Handouts Education comprehension: verbalized understanding, returned demonstration, verbal cues required, and needs further education  HOME EXERCISE PROGRAM: From previous POC:  Standing PWR moves Multi-directional stepping sheet  Pt also has a stretching program he has at home.    Access Code: [email or web address] URL: [email or web address] Date: 11/09/2023 Prepared by: Marlon Plaster  Exercises - Heel Toe Raises with Counter Support  - 1 x daily - 5 x weekly - 1-2 sets - 10 reps - Standing Single Leg Stance with  Counter Support  - 1 x daily - 5 x weekly - 3 sets - 10-15 hold - Sidelying Open Book Thoracic Lumbar Rotation and Extension  - 1 x daily - 7 x weekly - 3 sets - 10  reps - Standing Near Stance in [place] with Eyes Closed  - 1 x daily - 7 x weekly - 3 sets - 30-45 seconds  hold  GOALS: Goals reviewed with patient? Yes  SHORT TERM GOALS: Target date: 11/24/2023   Pt will improve miniBEST to at least a 22/28 in order to demo decr fall risk.  Baseline:20/28 Goal status: INITIAL  2.  Pt will improve gait velocity to at least 2.7 ft/s w/LRAD for improved gait efficiency and independence   Baseline: 2.55 ft/s w/rollator  Goal status: INITIAL  3.  Pt will verbalize and demonstrate understanding of proper bed mobility techniques for improved independence and efficiency w/bed mobility at home  Baseline:  Goal status: INITIAL   LONG TERM GOALS: Target date: 12/22/2023   Pt will improve miniBEST to at least a 25/28 in order to demo decr fall risk. Baseline: 20/28 Goal status: INITIAL  2.  Pt will improve gait velocity to at least 3.0 ft/s w/LRAD for improved gait efficiency and independence  Baseline: 2.55 ft/s w/rollator  Goal status: INITIAL   ASSESSMENT:  CLINICAL IMPRESSION: Patient was within normal limits for BP for today's session and asymptomatic. Today's skilled session advanced to cognitive tasks during foam balance and multidirectional stepping to improve dual tasking and foot clearance. Direction changing tasks continues to be challenging due to decreased foot clearance and challenge is increase with cognitive tasks as patient needs continuous reminders and cues to complete task correctly. Min-mod assist used throughout with mod assist needed during balance and dual tasks. Stepping up onto different surfaces and dual tasking should be continued to improve above deficits. Will continue per POC.    OBJECTIVE IMPAIRMENTS: Abnormal gait, decreased activity tolerance, decreased  balance, decreased coordination, decreased knowledge of use of DME, decreased mobility, difficulty walking, decreased strength, increased fascial restrictions, increased muscle spasms, impaired sensation, improper body mechanics, postural dysfunction, and pain  ACTIVITY LIMITATIONS: carrying, lifting, bending, standing, squatting, stairs, transfers, bed mobility, reach over head, locomotion level, and caring for others  PARTICIPATION LIMITATIONS: meal prep, cleaning, laundry, interpersonal relationship, shopping, community activity, and yard work  PERSONAL FACTORS: Fitness, Past/current experiences, and 1-2 comorbidities: CVA and PD are also affecting patient's functional outcome.   REHAB POTENTIAL: Good  CLINICAL DECISION MAKING: Evolving/moderate complexity  EVALUATION COMPLEXITY: Moderate  PLAN:  PT FREQUENCY: 2x/week  PT DURATION: 8 weeks  PLANNED INTERVENTIONS: 97164- PT Re-evaluation, 97750- Physical Performance Testing, 97110-Therapeutic exercises, 97530- Therapeutic activity, [id number]- Neuromuscular re-education, 97535- Self Care, [id number]- Manual therapy, [id number]- Gait training, [phone]- Aquatic Therapy, [phone]- Electrical stimulation (manual), [phone] (1-2 muscles), [id number] (3+ muscles)- Dry Needling, Patient/Family education, Balance training, Stair training, Joint mobilization, Spinal mobilization, Vestibular training, and DME instructions  PLAN FOR NEXT SESSION: Work on lateral weight shifting, functional strength of LLE, postural control. SLS stability.  Balance on unlevel surfaces, dual tasking   Assess STG!  Emmalene Sherry, Student-PT, DPT 11/19/2023, 4:34 PM

## 2023-11-22 NOTE — Therapy (Signed)
 Pt unable to be seen for OP OT evaluation today. He will require updated referral.

## 2023-11-23 ENCOUNTER — Ambulatory Visit: Admitting: Physical Therapy

## 2023-11-23 ENCOUNTER — Ambulatory Visit: Admitting: Occupational Therapy

## 2023-11-26 ENCOUNTER — Ambulatory Visit: Admitting: Occupational Therapy

## 2023-11-26 ENCOUNTER — Ambulatory Visit: Admitting: Physical Therapy

## 2023-11-26 VITALS — BP 126/73 | HR 85

## 2023-11-26 DIAGNOSIS — R278 Other lack of coordination: Secondary | ICD-10-CM

## 2023-11-26 DIAGNOSIS — R2681 Unsteadiness on feet: Secondary | ICD-10-CM

## 2023-11-26 DIAGNOSIS — R29818 Other symptoms and signs involving the nervous system: Secondary | ICD-10-CM

## 2023-11-26 DIAGNOSIS — R29898 Other symptoms and signs involving the musculoskeletal system: Secondary | ICD-10-CM

## 2023-11-26 DIAGNOSIS — M6281 Muscle weakness (generalized): Secondary | ICD-10-CM

## 2023-11-26 DIAGNOSIS — G250 Essential tremor: Secondary | ICD-10-CM

## 2023-11-26 DIAGNOSIS — R293 Abnormal posture: Secondary | ICD-10-CM

## 2023-11-26 NOTE — Therapy (Signed)
 OUTPATIENT OCCUPATIONAL THERAPY PARKINSON'S TREATMENT  Patient Name: Joshua [name] MRN: [id number] DOB:1945/07/10, 78 y.o., male Today's Date: 11/26/2023  PCP: Loreli Elsie JONETTA Mickey., MD  REFERRING PROVIDER: Evonnie Asberry RAMAN, DO  END OF SESSION:  OT End of Session - 11/26/23 1203     Visit Number 5    Number of Visits 17    Date for Recertification  01/21/24   currently only schedule until 11/26   Authorization Type [employer]    OT Start Time 1150    OT Stop Time 1230    OT Time Calculation (min) 40 min    Activity Tolerance Patient tolerated treatment well    Behavior During Therapy [facility] for tasks assessed/performed         Past Medical History:  Diagnosis Date   Allergic rhinitis    Anxiety    from chronic pain from surgery- on Cymbalta    Arthritis    Asthma    Barrett's esophagus 03/29/2014   Carotid artery disease    Carotid Doppler normal August, 2007   Coronary artery disease    Diverticulosis    Dyslipidemia    GERD (gastroesophageal reflux disease)    History of loop recorder    has since 04/04/15   HTN (hypertension)    takes Metoprolol  for PVC control   Hx of colonic polyps    adenomatous   IFG (impaired fasting glucose)    Myocardial infarction (HCC)    mild   Neuromuscular disorder (HCC)    parkinson's tremors in hands   Palpitations    Benign PVCs   Parkinson disease (HCC)    Pneumonia    Prostate cancer (HCC)    RBBB (right bundle branch block)    rate related   Shingles    Stroke [facility]) 2017   January 01, 2023,right hand weakness,   TIA (transient ischemic attack) 02/2015   Per pt, had 2 strokes   Vertigo    Past Surgical History:  Procedure Laterality Date   BACK SURGERY  2002,2009   x 6   CHOLECYSTECTOMY  11/30/2012   with IOC   COLONOSCOPY     ELECTROPHYSIOLOGIC STUDY N/A 09/26/2015   Procedure: V Tach Ablation (PVC);  Surgeon: Will Gladis Norton, MD;  Location: [facility] INVASIVE CV LAB;  Service: Cardiovascular;   Laterality: N/A;   EP IMPLANTABLE DEVICE N/A 04/04/2015   Procedure: Loop Recorder Insertion;  Surgeon: Will Gladis Norton, MD;  Location: [facility] INVASIVE CV LAB;  Service: Cardiovascular;  Laterality: N/A;   HERNIA REPAIR     laprascopic   KNEE SURGERY     DECEMBER 2017,LEFT KNEE SCOPED   LEFT HEART CATH AND CORONARY ANGIOGRAPHY N/A 02/05/2021   Procedure: LEFT HEART CATH AND CORONARY ANGIOGRAPHY;  Surgeon: Darron Deatrice LABOR, MD;  Location: [facility] INVASIVE CV LAB;  Service: Cardiovascular;  Laterality: N/A;   NECK SURGERY  2002   POLYPECTOMY     ROTATOR CUFF REPAIR Left    TEE WITHOUT CARDIOVERSION N/A 04/04/2015   Procedure: TRANSESOPHAGEAL ECHOCARDIOGRAM (TEE);  Surgeon: Redell RAMAN Shallow, MD;  Location: [facility] ENDOSCOPY;  Service: Cardiovascular;  Laterality: N/A;   TOTAL KNEE ARTHROPLASTY Left 09/28/2022   Procedure: TOTAL KNEE ARTHROPLASTY;  Surgeon: Edna Toribio LABOR, MD;  Location: [facility] ORS;  Service: Orthopedics;  Laterality: Left;   TOTAL KNEE ARTHROPLASTY Right 01/11/2023   Procedure: TOTAL KNEE ARTHROPLASTY;  Surgeon: Edna Toribio LABOR, MD;  Location: [facility] ORS;  Service: Orthopedics;  Laterality: Right;   TRIGGER FINGER RELEASE Left 12/28/2019  Procedure: RELEASE TRIGGER FINGER/A-1 PULLEY THUMB, MIDDLE AND RING;  Surgeon: Murrell Kuba, MD;  Location: [facility];  Service: Orthopedics;  Laterality: Left;  FAB   UPPER GASTROINTESTINAL ENDOSCOPY     V Tach ablation  09/26/2015   Patient Active Problem List   Diagnosis Date Noted   CVA (cerebral vascular accident) (HCC) 01/15/2023   Primary osteoarthritis of right knee 01/11/2023   Localized osteoarthritis of right knee 01/11/2023   Primary osteoarthritis of left knee 09/28/2022   SBO (small bowel obstruction) (HCC) 05/29/2022   Chest pain 05/26/2022   Epigastric abdominal pain 05/26/2022   Coronary artery disease involving native coronary artery of native heart without angina pectoris 05/28/2021   History of myocardial  infarction 02/05/2021   Parkinson's disease (HCC)    Malignant neoplasm of prostate (HCC) 11/01/2017   Essential tremor 12/06/2015   PVC (premature ventricular contraction) 09/26/2015   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) 04/07/2015   Acute CVA (cerebrovascular accident) (HCC) 03/06/2015   Stroke (cerebrum) (HCC) 03/05/2015   Cerebral infarction (HCC) 03/05/2015   Weakness of right upper extremity    Tremor    Ejection fraction    Vertigo    Sleep apnea    Palpitations    Hyperlipidemia with target LDL less than 70    GERD (gastroesophageal reflux disease)    HTN (hypertension)    Chest pain    RBBB (right bundle branch block)    Ejection fraction    Carotid artery disease (HCC)    ONSET DATE: 11/03/2023 (Date of referral)  REFERRING DIAG: G20.B1 (ICD-10-CM) - Parkinson's disease with dyskinesia, without mention of fluctuations   THERAPY DIAG:  Muscle weakness (generalized)  Other symptoms and signs involving the nervous system  Other lack of coordination  Other symptoms and signs involving the musculoskeletal system  Essential tremor  Rationale for Evaluation and Treatment: Rehabilitation  SUBJECTIVE:   SUBJECTIVE STATEMENT: He reports he still cannot find his PD binder. He has been completing exercises from YouTube (not initiated by OT).  Pt accompanied by: self  PERTINENT HISTORY: Rt TKA 01/11/23 with subsequent acute Lt CVA affecting Rt side, PD, Anxiety, HTN, HLD, multiple back surgeries/fusion neck and lumbar spine,  NSTEMI, 01/2020, hx of multiple syncopal episodes, CVA in 2017   PRECAUTIONS: Fall  WEIGHT BEARING RESTRICTIONS: No  PAIN:  Are you having pain? Yes: NPRS scale: 5/10 Pain location: back Pain description: pulled Aggravating factors: exercises Relieving factors: rest, medications  FALLS: Has patient fallen in last 6 months? Yes. Number of falls 3  LIVING ENVIRONMENT: Lives with: lives with their spouse Lives in:  House/apartment Stairs: Yes: External: 1 steps; none Has following equipment at home: Single point cane, Walker - 2 wheeled, Environmental Consultant - 4 wheeled, shower chair, Grab bars, and [employer] and transport chair  PLOF: Independent; horticulturist, commercial; driving   PATIENT GOALS: To improve dishes, folding laundry, and   OBJECTIVE:  Note: Objective measures were completed at Evaluation unless otherwise noted.  HAND DOMINANCE: Right  ADLs: Overall ADLs: mod I  Eating: setup for cutting salad  Equipment: hower seat with back, Grab bars, Walk in shower, Reacher, Sock aid, Long handled shoe horn, and Long handled sponge  IADLs: Shopping: supervision Light housekeeping: mod I Meal Prep: mod I Community mobility: driving mod I Medication management: mod I Financial management: 50/50 with wife Handwriting: 25% legible or less with pen vs pencil  MOBILITY STATUS: Needs Assist: uses SPC and Hx of falls  POSTURE COMMENTS:  rounded shoulders, forward head, increased thoracic kyphosis, posterior pelvic tilt, and weight shift right   ACTIVITY TOLERANCE: Activity tolerance: good to fair  FUNCTIONAL OUTCOME MEASURES: 11/12/2023: Fastening/unfastening 3 buttons: only able to button 2 buttons in 150 seconds  Physical performance test: PPT#2 (simulated eating) Right: 25 seconds; Left: 24 sec   PPT#4 (donning/doffing jacket): TBD  HAND FUNCTION: Grip strength: Right: 28.2 lbs  COORDINATION: 9 Hole Peg test: Right: 168 sec; Left: 57 sec Box and Blocks:  Right 25 blocks, Left 43 blocks  UE ROM:  WFL; chronic wrist injury with limited flexion and no ext.  UE MMT:   WFL  SENSATION: Moderate to severe paresthesias in R hand and fingers primarily but extends up to shoulder, worse since stroke   MUSCLE TONE: RUE: Rigidity; mild   COGNITION: Overall cognitive status: Within functional limits for tasks assessed  OBSERVATIONS: Bradykinesia and Postural tremors                                                                                                                    TREATMENT :  -Self-care/home management completed for duration as noted below including:  OT assessed buttoning time with use of button hook to fasten and hands only for unbuttoning. Patient required cues to incorporate RUE into task and review of button hook use. Pt able to improve his time as noted in goals section following education.  OT educated pt on adaptive strategies for donning of compression socks including method as noted in pt instructions, use of sock aide (pt already owns), and use of stocking donner. Further more, pt was encouraged to use tall sitting in bed with head of bed raised to reduce how far he would need to bend forward or using a footstool for the same benefit.   - Therapeutic activities completed for duration as noted below including: Pt participated in 4 games of Uzzle at level(s) 1 using RUE and LUE for eye-hand coordination and cognition by matching game pieces to visual patterns. Pt demonstrating ability to solve level(s) 1 with mod to max cueing and increased time.   PATIENT EDUCATION: Education details: scientist, research (life sciences); buttoning Person educated: Patient Education method: Explanation, Demonstration, Tactile cues, Verbal cues, and Handouts Education comprehension: verbalized understanding, returned demonstration, verbal cues required, tactile cues required, and needs further education  HOME EXERCISE PROGRAM: 11/12/2023: buttoning 11/16/2023: Big ADLs; coordination; PWR! Hands 11/26/2023: Stocking donning  GOALS:  SHORT TERM GOALS: Target date: 12/07/2023    Pt will be independent with PD specific HEP.  Baseline: not yet initiated Goal status: IN PROGRESS  2.  Pt will verbalize understanding of adapted strategies to maximize safety and independence with ADLs/IADLs.  Baseline: not yet initiated Goal status: IN PROGRESS  3.  Pt will write a sentence with no  significant decrease in size and maintain 50% legibility.  Baseline: <25% legibility Goal status: INITIAL  4.  Pt will demonstrate improved fine motor coordination for ADLs as evidenced by decreasing 9 hole peg test  score for each hand by at least 8 seconds.  Baseline: Right: 168 sec; Left: 57 sec Goal status: INITIAL  5.  Pt will demonstrate improved ease with fastening buttons as evidenced by improving 3 button/unbutton time to be able to complete full assessment in 85 seconds or less.  Baseline: only able to button 2 buttons in 150 seconds - pt reported unable to feel his fingertips at time of eval 11/16/2023: using button hook for buttoning and unbuttoning, pt able to complete in 102 seconds 11/26/2023: 72 sec following repeat education Goal status: IN PROGRESS  LONG TERM GOALS: Target date: 01/21/2024    1.  Pt will verbalize understanding of ways to prevent future PD related complications and PD community resources.  Baseline: not yet initiated Goal status: INITIAL  2.  Pt will write a short paragraph with no significant decrease in size and maintain 75% legibility.  Baseline: <25% legibility Goal status: INITIAL  3.  Pt will verbalize understanding of ways to keep thinking skills sharp and ways to compensate for STM changes in the future.  Baseline: not yet initiated Goal status: INITIAL  4.  Pt will demonstrate improved ease with feeding as evidenced by decreasing PPT#2 by at least 4 seconds.  Baseline: Right: 25 seconds; Left: 24 sec  Goal status: INITIAL  5.  Pt will demonstrate increased ease with dressing as evidenced by decreasing PPT#4 (don/ doff jacket) to 20 secs or less.  Baseline: 30 seconds Goal status: INITIAL  6.  Pt will demonstrate improved fine motor coordination for ADLs as evidenced by decreasing 9 hole peg test score for each hand by at least 12 secs  Baseline: Right: 168 sec; Left: 57 sec Goal status: INITIAL   7.  Pt will be able to place at  least 8 blocks using R hand with completion of Box and Blocks test.  Baseline:  Right 25 blocks, Left 43 blocks Goal status: INITIAL  ASSESSMENT:  CLINICAL IMPRESSION: Pt verbalizes good understanding of information today though may require repeat education due to issues with recall and to promote carryover. Recommend focusing on incorporation of RUE and spatial relations during his upcoming visits given noted difficulty today. Pt to bring in binder at next session for HEP review. He was provided with written reminder to assist with recall.   PERFORMANCE DEFICITS: in functional skills including ADLs, IADLs, coordination, dexterity, strength, Fine motor control, Gross motor control, mobility, decreased knowledge of precautions, decreased knowledge of use of DME, and UE functional use.   IMPAIRMENTS: are limiting patient from ADLs, IADLs, leisure, and social participation.   COMORBIDITIES:  may have co-morbidities  that affects occupational performance. Patient will benefit from skilled OT to address above impairments and improve overall function.  REHAB POTENTIAL: Good  PLAN:  OT FREQUENCY: 1-2x/week  OT DURATION: 8 weeks  PLANNED INTERVENTIONS: 97168 OT Re-evaluation, 97535 self care/ADL training, 02889 therapeutic exercise, 97530 therapeutic activity, 97112 neuromuscular re-education, 97750 Physical Performance Testing, functional mobility training, coping strategies training, patient/family education, and DME and/or AE instructions  RECOMMENDED OTHER SERVICES: N/A for this visit  CONSULTED AND AGREED WITH PLAN OF CARE: Patient  PLAN FOR NEXT SESSION:  review prior HEPs; how was stocking donning?   Jocelyn CHRISTELLA Bottom, OT 11/26/2023, 1:15 PM

## 2023-11-26 NOTE — Therapy (Signed)
 OUTPATIENT PHYSICAL THERAPY NEURO TREATMENT   Patient Name: Joshua [name] MRN: [id number] DOB:31-Jul-1945, 78 y.o., male Today's Date: 11/26/2023   PCP: Loreli Elsie JONETTA Mickey., MD REFERRING PROVIDER: Loreli Elsie JONETTA Mickey., MD  END OF SESSION:  PT End of Session - 11/26/23 1455     Visit Number 8    Number of Visits 17   Plus eval   Date for Recertification  01/05/24    Authorization Type [employer]    PT Start Time 1102    PT Stop Time 1145    PT Time Calculation (min) 43 min    Equipment Utilized During Treatment Gait belt    Activity Tolerance Patient tolerated treatment well    Behavior During Therapy [facility] for tasks assessed/performed             Past Medical History:  Diagnosis Date   Allergic rhinitis    Anxiety    from chronic pain from surgery- on Cymbalta    Arthritis    Asthma    Barrett's esophagus 03/29/2014   Carotid artery disease    Carotid Doppler normal August, 2007   Coronary artery disease    Diverticulosis    Dyslipidemia    GERD (gastroesophageal reflux disease)    History of loop recorder    has since 04/04/15   HTN (hypertension)    takes Metoprolol  for PVC control   Hx of colonic polyps    adenomatous   IFG (impaired fasting glucose)    Myocardial infarction (HCC)    mild   Neuromuscular disorder (HCC)    parkinson's tremors in hands   Palpitations    Benign PVCs   Parkinson disease (HCC)    Pneumonia    Prostate cancer (HCC)    RBBB (right bundle branch block)    rate related   Shingles    Stroke [facility]) 2017   January 01, 2023,right hand weakness,   TIA (transient ischemic attack) 02/2015   Per pt, had 2 strokes   Vertigo    Past Surgical History:  Procedure Laterality Date   BACK SURGERY  2002,2009   x 6   CHOLECYSTECTOMY  11/30/2012   with IOC   COLONOSCOPY     ELECTROPHYSIOLOGIC STUDY N/A 09/26/2015   Procedure: V Tach Ablation (PVC);  Surgeon: Will Gladis Norton, MD;  Location: [facility] INVASIVE CV [facility];   Service: Cardiovascular;  Laterality: N/A;   EP IMPLANTABLE DEVICE N/A 04/04/2015   Procedure: Loop Recorder Insertion;  Surgeon: Will Gladis Norton, MD;  Location: [facility];  Service: Cardiovascular;  Laterality: N/A;   HERNIA REPAIR     laprascopic   KNEE SURGERY     DECEMBER 2017,LEFT KNEE SCOPED   LEFT HEART CATH AND CORONARY ANGIOGRAPHY N/A 02/05/2021   Procedure: LEFT HEART CATH AND CORONARY ANGIOGRAPHY;  Surgeon: Darron Deatrice LABOR, MD;  Location: [facility] CV [facility];  Service: Cardiovascular;  Laterality: N/A;   NECK SURGERY  2002   POLYPECTOMY     ROTATOR CUFF REPAIR Left    TEE WITHOUT CARDIOVERSION N/A 04/04/2015   Procedure: TRANSESOPHAGEAL ECHOCARDIOGRAM (TEE);  Surgeon: Redell GORMAN Shallow, MD;  Location: [facility] ENDOSCOPY;  Service: Cardiovascular;  Laterality: N/A;   TOTAL KNEE ARTHROPLASTY Left 09/28/2022   Procedure: TOTAL KNEE ARTHROPLASTY;  Surgeon: Edna Toribio LABOR, MD;  Location: [facility] ORS;  Service: Orthopedics;  Laterality: Left;   TOTAL KNEE ARTHROPLASTY Right 01/11/2023   Procedure: TOTAL KNEE ARTHROPLASTY;  Surgeon: Edna Toribio LABOR, MD;  Location: [facility] ORS;  Service: Orthopedics;  Laterality: Right;   TRIGGER FINGER RELEASE Left 12/28/2019   Procedure: RELEASE TRIGGER FINGER/A-1 PULLEY THUMB, MIDDLE AND RING;  Surgeon: Murrell Kuba, MD;  Location: [facility];  Service: Orthopedics;  Laterality: Left;  FAB   UPPER GASTROINTESTINAL ENDOSCOPY     V Tach ablation  09/26/2015   Patient Active Problem List   Diagnosis Date Noted   CVA (cerebral vascular accident) (HCC) 01/15/2023   Primary osteoarthritis of right knee 01/11/2023   Localized osteoarthritis of right knee 01/11/2023   Primary osteoarthritis of left knee 09/28/2022   SBO (small bowel obstruction) (HCC) 05/29/2022   Chest pain 05/26/2022   Epigastric abdominal pain 05/26/2022   Coronary artery disease involving native coronary artery of native heart without angina pectoris 05/28/2021    History of myocardial infarction 02/05/2021   Parkinson's disease (HCC)    Malignant neoplasm of prostate (HCC) 11/01/2017   Essential tremor 12/06/2015   PVC (premature ventricular contraction) 09/26/2015   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) 04/07/2015   Acute CVA (cerebrovascular accident) (HCC) 03/06/2015   Stroke (cerebrum) (HCC) 03/05/2015   Cerebral infarction (HCC) 03/05/2015   Weakness of right upper extremity    Tremor    Ejection fraction    Vertigo    Sleep apnea    Palpitations    Hyperlipidemia with target LDL less than 70    GERD (gastroesophageal reflux disease)    HTN (hypertension)    Chest pain    RBBB (right bundle branch block)    Ejection fraction    Carotid artery disease (HCC)     ONSET DATE: 10/15/2023 (referral)   REFERRING DIAG: R29.6 (ICD-10-CM) - Recurrent falls  THERAPY DIAG:  Muscle weakness (generalized)  Unsteadiness on feet  Abnormal posture  Rationale for Evaluation and Treatment: Rehabilitation  SUBJECTIVE:                                                                                                                                                                                             SUBJECTIVE STATEMENT:  Patient ambulates into clinic with rollator. Patient stated that physician took meds away and feels less cloudy in the head now. Patient denies falls.   Pt accompanied by: self  PERTINENT HISTORY: Rt TKA 01/11/23 with subsequent acute Lt CVA affecting Rt side, PD, Anxiety, HTN, HLD, multiple back surgeries/fusion neck and lumbar spine,  NSTEMI, 01/2020, hx of multiple syncopal episodes, CVA in 2017  PAIN:  Are you having pain? Yes: NPRS scale: 6/10 Pain location: low back (L2)  Pain description: Achy  Aggravating factors: Too much movement  Relieving  factors: Ablation, medications   PRECAUTIONS: Fall  RED FLAGS: None   WEIGHT BEARING RESTRICTIONS: No  FALLS: Has patient fallen in last 6  months? Yes. Number of falls 3  LIVING ENVIRONMENT: Lives with: lives with their spouse Lives in: House/apartment Stairs: Yes: External: 1 steps; none Has following equipment at home: Single point cane, Walker - 2 wheeled, Environmental Consultant - 4 wheeled, shower chair, Grab bars, and [employer] and transport chair  PLOF: Independent  PATIENT GOALS: Just to work on my balance   OBJECTIVE:  Note: Objective measures were completed at Evaluation unless otherwise noted.  DIAGNOSTIC FINDINGS: MRI of Lumbar spine from 05/15/23  IMPRESSION: 1. Operative changes of posterior fusion from L3 through S1. Interbody fusion at these levels. Prior laminotomies at L3-L4 and L5-S1. 2. Moderate foraminal stenoses on the right at L3-L4 and bilaterally at L4-L5. 3. Mild canal stenosis at L2-L3 and L3-L4.  CT of head from 09/10/23 CT CERVICAL SPINE FINDINGS   Alignment: Mild anterolisthesis of C2 on C3 and C4 on C5 is degenerate etiology and unchanged from the prior. No traumatic malalignment.   Skull base and vertebrae: No evidence of acute fracture. Vertebral body heights are maintained. Solid C5-C6 ACDF.   Soft tissues and spinal canal: No prevertebral fluid or swelling. No visible canal hematoma.   Disc levels: Similar multilevel degenerative change.   Upper chest: Visualized lung apices are clear.   IMPRESSION: No evidence of acute abnormality intracranially or in the cervical spine.  COGNITION: Overall cognitive status: Within functional limits for tasks assessed   SENSATION: Pt reports bilateral numbness/tingling in distal BLEs from feet to knees    POSTURE: rounded shoulders, forward head, increased thoracic kyphosis, posterior pelvic tilt, and weight shift right  LOWER EXTREMITY ROM:     Active  Right Eval Left Eval  Hip flexion    Hip extension    Hip abduction    Hip adduction    Hip internal rotation    Hip external rotation    Knee flexion    Knee extension    Ankle  dorsiflexion    Ankle plantarflexion    Ankle inversion    Ankle eversion     (Blank rows = not tested)  LOWER EXTREMITY MMT:    MMT Right Eval Left Eval  Hip flexion    Hip extension    Hip abduction    Hip adduction    Hip internal rotation    Hip external rotation    Knee flexion    Knee extension    Ankle dorsiflexion    Ankle plantarflexion    Ankle inversion    Ankle eversion    (Blank rows = not tested)  BED MOBILITY:  Not tested Pt reports difficulty getting out of the bed due to back pain and has increased difficulty getting out on the R side   TRANSFERS: Sit to stand: SBA  Assistive device utilized: None     Stand to sit: SBA  Assistive device utilized: None      RAMP:  Not tested  CURB:  Not tested  STAIRS: Not tested GAIT: Gait pattern: step through pattern, decreased step length- Right, decreased stance time- Left, decreased stride length, decreased ankle dorsiflexion- Right, knee flexed in stance- Right, knee flexed in stance- Left, lateral hip instability, lateral lean- Right, decreased trunk rotation, trunk flexed, and poor foot clearance- Right Distance walked: Various clinic distances  Assistive device utilized: Single point cane and None Level of assistance: SBA Comments: Pt  presented to session carrying SPC but did not use it during session. Noted continued truncal lean to R side but no overt LOB noted.    VITALS  Vitals:   11/26/23 1110 11/26/23 1111  BP: 126/71 126/73  Pulse: 82 85                                                                                                                                      TREATMENT:    Self-care  Vitals:   11/26/23 1110 11/26/23 1111  BP: 126/71 126/73  Pulse: 82 85     Seated WNL for session Pt reporting no lightheadedness and no other symptoms today.  Reviewed safe completion of HEP and stepping strategies on foam at home, and that he can complete at home as long as he has  something nearby that he can hold on for UE support. Patient educated that he should not use side table for balancing and needs a taller surface.    NMR Gait training with use walking sticks, for increased step length and reciprocal arm swing x 345 total ft (3 laps) 3 laps with break after due to fatigue PT behind patient initiating arm swing with walking sticks during gait training Well tolerated, great response to and carry over with cueing and decrease arm swing and short step length with fatigue. Sit to stand at mat table: Pt performs sit to stand with purple resistance band and PWR! x 5 reps Up with chest opening and big fingers, challenging, cues needs for anterior chest/weight shifting and hip extension, SBA Sit to stand at mat table: Pt performs sit to stand with purple resistance band and 6# ball slam, 2 sets x 10 reps Needs cues for hip extension, SBA for ball retrieval as needed On air ex: Steps with sign - directions written with different colors, colors called out and directions performed Continued to be challenging by cognitive dual tasking, min assist periodically for LOB On air ex: Steps with sign and squigz grasps with opposite arm and squigz color call out - directions written with different colors, colors called out and directions performed Continued to be challenging by cognitive dual tasking, mis assist periodically for LOB *Moderate assistance needed due to loss of balance during balance tasks and difficulty clearing foot  PATIENT EDUCATION: Education details: see above Person educated: Patient Education method: Explanation, Demonstration, Verbal cues, and Handouts Education comprehension: verbalized understanding, returned demonstration, verbal cues required, and needs further education  HOME EXERCISE PROGRAM: From previous POC:  Standing PWR moves Multi-directional stepping sheet  Pt also has a stretching program he has at home.    Access Code: [email or web address] URL:  [email or web address] Date: 11/09/2023 Prepared by: Marlon Plaster  Exercises - Heel Toe Raises with Counter Support  - 1 x daily - 5 x weekly - 1-2 sets - 10 reps - Standing Single Leg Stance with Counter Support  - 1 x daily -  5 x weekly - 3 sets - 10-15 hold - Sidelying Open Book Thoracic Lumbar Rotation and Extension  - 1 x daily - 7 x weekly - 3 sets - 10 reps - Standing Near Stance in Corner with Eyes Closed  - 1 x daily - 7 x weekly - 3 sets - 30-45 seconds  hold  GOALS: Goals reviewed with patient? Yes  SHORT TERM GOALS: Target date: 11/24/2023   Pt will improve miniBEST to at least a 22/28 in order to demo decr fall risk.  Baseline:20/28 Goal status: INITIAL  2.  Pt will improve gait velocity to at least 2.7 ft/s w/LRAD for improved gait efficiency and independence   Baseline: 2.55 ft/s w/rollator  Goal status: INITIAL  3.  Pt will verbalize and demonstrate understanding of proper bed mobility techniques for improved independence and efficiency w/bed mobility at home  Baseline:  Goal status: INITIAL   LONG TERM GOALS: Target date: 12/22/2023   Pt will improve miniBEST to at least a 25/28 in order to demo decr fall risk. Baseline: 20/28 Goal status: INITIAL  2.  Pt will improve gait velocity to at least 3.0 ft/s w/LRAD for improved gait efficiency and independence  Baseline: 2.55 ft/s w/rollator  Goal status: INITIAL   ASSESSMENT:  CLINICAL IMPRESSION: Emphasis of skilled PT session today included monitoring vitals, gait training and continued cognitive tasks during balance and foam stepping. Patient BP was within normal limits for today's session and pt reported to be asymptomatic. Gait training was well tolerated with good carry over to clinic ambulation. Continued with challenging cognitive tasks and inclusion of squigz for reaching with following color commands in addition to multidirectional stepping. Patient continues to need continuous  reminders and cues to complete task correctly. Min assist used during balance and dual tasks. Stepping up onto different surfaces and cognitive dual tasking should be continued to improve above deficits. Will continue per POC.     OBJECTIVE IMPAIRMENTS: Abnormal gait, decreased activity tolerance, decreased balance, decreased coordination, decreased knowledge of use of DME, decreased mobility, difficulty walking, decreased strength, increased fascial restrictions, increased muscle spasms, impaired sensation, improper body mechanics, postural dysfunction, and pain  ACTIVITY LIMITATIONS: carrying, lifting, bending, standing, squatting, stairs, transfers, bed mobility, reach over head, locomotion level, and caring for others  PARTICIPATION LIMITATIONS: meal prep, cleaning, laundry, interpersonal relationship, shopping, community activity, and yard work  PERSONAL FACTORS: Fitness, Past/current experiences, and 1-2 comorbidities: CVA and PD are also affecting patient's functional outcome.   REHAB POTENTIAL: Good  CLINICAL DECISION MAKING: Evolving/moderate complexity  EVALUATION COMPLEXITY: Moderate  PLAN:  PT FREQUENCY: 2x/week  PT DURATION: 8 weeks  PLANNED INTERVENTIONS: [id number]- PT Re-evaluation, 97750- Physical Performance Testing, 97110-Therapeutic exercises, 97530- Therapeutic activity, [id number]- Neuromuscular re-education, 97535- Self Care, [id number]- Manual therapy, [id number]- Gait training, [phone]- Aquatic Therapy, [phone]- Electrical stimulation (manual), [phone] (1-2 muscles), [id number] (3+ muscles)- Dry Needling, Patient/Family education, Balance training, Stair training, Joint mobilization, Spinal mobilization, Vestibular training, and DME instructions  PLAN FOR NEXT SESSION: Work on lateral weight shifting, functional strength of LLE, postural control. SLS stability.  Balance on unlevel surfaces, dual tasking   Assess STG!  Emmalene Sherry, Student-PT, DPT 11/26/2023, 2:56 PM

## 2023-11-30 ENCOUNTER — Ambulatory Visit: Admitting: Physical Therapy

## 2023-11-30 ENCOUNTER — Encounter: Payer: Self-pay | Admitting: Physical Therapy

## 2023-11-30 ENCOUNTER — Ambulatory Visit: Admitting: Occupational Therapy

## 2023-11-30 VITALS — BP 92/51 | HR 85

## 2023-11-30 DIAGNOSIS — M6281 Muscle weakness (generalized): Secondary | ICD-10-CM

## 2023-11-30 DIAGNOSIS — R293 Abnormal posture: Secondary | ICD-10-CM

## 2023-11-30 DIAGNOSIS — R29818 Other symptoms and signs involving the nervous system: Secondary | ICD-10-CM

## 2023-11-30 DIAGNOSIS — R2681 Unsteadiness on feet: Secondary | ICD-10-CM

## 2023-11-30 NOTE — Therapy (Signed)
 OUTPATIENT PHYSICAL THERAPY NEURO TREATMENT   Patient Name: Joshua [name] MRN: [id number] DOB:05-24-1945, 78 y.o., male Today's Date: 11/30/2023   PCP: Loreli Elsie JONETTA Mickey., MD REFERRING PROVIDER: Loreli Elsie JONETTA Mickey., MD  END OF SESSION:  PT End of Session - 11/30/23 1105     Visit Number 9    Number of Visits 17   Plus eval   Date for Recertification  01/05/24    Authorization Type [employer]    Progress Note Due on Visit --    PT Start Time 1104    PT Stop Time 1142    PT Time Calculation (min) 38 min    Equipment Utilized During Treatment Gait belt    Activity Tolerance Patient tolerated treatment well   lower BP   Behavior During Therapy [facility] for tasks assessed/performed             Past Medical History:  Diagnosis Date   Allergic rhinitis    Anxiety    from chronic pain from surgery- on Cymbalta    Arthritis    Asthma    Barrett's esophagus 03/29/2014   Carotid artery disease    Carotid Doppler normal August, 2007   Coronary artery disease    Diverticulosis    Dyslipidemia    GERD (gastroesophageal reflux disease)    History of loop recorder    has since 04/04/15   HTN (hypertension)    takes Metoprolol  for PVC control   Hx of colonic polyps    adenomatous   IFG (impaired fasting glucose)    Myocardial infarction (HCC)    mild   Neuromuscular disorder (HCC)    parkinson's tremors in hands   Palpitations    Benign PVCs   Parkinson disease (HCC)    Pneumonia    Prostate cancer (HCC)    RBBB (right bundle branch block)    rate related   Shingles    Stroke [facility]) 2017   January 01, 2023,right hand weakness,   TIA (transient ischemic attack) 02/2015   Per pt, had 2 strokes   Vertigo    Past Surgical History:  Procedure Laterality Date   BACK SURGERY  2002,2009   x 6   CHOLECYSTECTOMY  11/30/2012   with IOC   COLONOSCOPY     ELECTROPHYSIOLOGIC STUDY N/A 09/26/2015   Procedure: V Tach Ablation (PVC);  Surgeon: Will Gladis Norton, MD;  Location: [facility] INVASIVE CV LAB;  Service: Cardiovascular;  Laterality: N/A;   EP IMPLANTABLE DEVICE N/A 04/04/2015   Procedure: Loop Recorder Insertion;  Surgeon: Will Gladis Norton, MD;  Location: [facility] INVASIVE CV LAB;  Service: Cardiovascular;  Laterality: N/A;   HERNIA REPAIR     laprascopic   KNEE SURGERY     DECEMBER 2017,LEFT KNEE SCOPED   LEFT HEART CATH AND CORONARY ANGIOGRAPHY N/A 02/05/2021   Procedure: LEFT HEART CATH AND CORONARY ANGIOGRAPHY;  Surgeon: Darron Deatrice LABOR, MD;  Location: [facility] INVASIVE CV LAB;  Service: Cardiovascular;  Laterality: N/A;   NECK SURGERY  2002   POLYPECTOMY     ROTATOR CUFF REPAIR Left    TEE WITHOUT CARDIOVERSION N/A 04/04/2015   Procedure: TRANSESOPHAGEAL ECHOCARDIOGRAM (TEE);  Surgeon: Redell GORMAN Shallow, MD;  Location: [facility] ENDOSCOPY;  Service: Cardiovascular;  Laterality: N/A;   TOTAL KNEE ARTHROPLASTY Left 09/28/2022   Procedure: TOTAL KNEE ARTHROPLASTY;  Surgeon: Edna Toribio LABOR, MD;  Location: [facility] ORS;  Service: Orthopedics;  Laterality: Left;   TOTAL KNEE ARTHROPLASTY Right 01/11/2023   Procedure: TOTAL KNEE  ARTHROPLASTY;  Surgeon: Edna Toribio LABOR, MD;  Location: [facility] ORS;  Service: Orthopedics;  Laterality: Right;   TRIGGER FINGER RELEASE Left 12/28/2019   Procedure: RELEASE TRIGGER FINGER/A-1 PULLEY THUMB, MIDDLE AND RING;  Surgeon: Murrell Kuba, MD;  Location: [facility];  Service: Orthopedics;  Laterality: Left;  FAB   UPPER GASTROINTESTINAL ENDOSCOPY     V Tach ablation  09/26/2015   Patient Active Problem List   Diagnosis Date Noted   CVA (cerebral vascular accident) (HCC) 01/15/2023   Primary osteoarthritis of right knee 01/11/2023   Localized osteoarthritis of right knee 01/11/2023   Primary osteoarthritis of left knee 09/28/2022   SBO (small bowel obstruction) (HCC) 05/29/2022   Chest pain 05/26/2022   Epigastric abdominal pain 05/26/2022   Coronary artery disease involving native coronary artery of  native heart without angina pectoris 05/28/2021   History of myocardial infarction 02/05/2021   Parkinson's disease (HCC)    Malignant neoplasm of prostate (HCC) 11/01/2017   Essential tremor 12/06/2015   PVC (premature ventricular contraction) 09/26/2015   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) 04/07/2015   Acute CVA (cerebrovascular accident) (HCC) 03/06/2015   Stroke (cerebrum) (HCC) 03/05/2015   Cerebral infarction (HCC) 03/05/2015   Weakness of right upper extremity    Tremor    Ejection fraction    Vertigo    Sleep apnea    Palpitations    Hyperlipidemia with target LDL less than 70    GERD (gastroesophageal reflux disease)    HTN (hypertension)    Chest pain    RBBB (right bundle branch block)    Ejection fraction    Carotid artery disease (HCC)     ONSET DATE: 10/15/2023 (referral)   REFERRING DIAG: R29.6 (ICD-10-CM) - Recurrent falls  THERAPY DIAG:  Muscle weakness (generalized)  Other symptoms and signs involving the nervous system  Unsteadiness on feet  Abnormal posture  Rationale for Evaluation and Treatment: Rehabilitation  SUBJECTIVE:                                                                                                                                                                                             SUBJECTIVE STATEMENT:  No falls, nothing new. Exercises are going well at home. Doing fair, still want to work on balance.   Pt accompanied by: self  PERTINENT HISTORY: Rt TKA 01/11/23 with subsequent acute Lt CVA affecting Rt side, PD, Anxiety, HTN, HLD, multiple back surgeries/fusion neck and lumbar spine,  NSTEMI, 01/2020, hx of multiple syncopal episodes, CVA in 2017  PAIN:  Are you having pain? Yes: NPRS scale: 6/10 Pain location:  low back (L2)  Pain description: Achy  Aggravating factors: Too much movement  Relieving factors: Ablation, medications   PRECAUTIONS: Fall  RED FLAGS: None   WEIGHT BEARING  RESTRICTIONS: No  FALLS: Has patient fallen in last 6 months? Yes. Number of falls 3  LIVING ENVIRONMENT: Lives with: lives with their spouse Lives in: House/apartment Stairs: Yes: External: 1 steps; none Has following equipment at home: Single point cane, Walker - 2 wheeled, Environmental Consultant - 4 wheeled, shower chair, Grab bars, and [employer] and transport chair  PLOF: Independent  PATIENT GOALS: Just to work on my balance   OBJECTIVE:  Note: Objective measures were completed at Evaluation unless otherwise noted.  DIAGNOSTIC FINDINGS: MRI of Lumbar spine from 05/15/23  IMPRESSION: 1. Operative changes of posterior fusion from L3 through S1. Interbody fusion at these levels. Prior laminotomies at L3-L4 and L5-S1. 2. Moderate foraminal stenoses on the right at L3-L4 and bilaterally at L4-L5. 3. Mild canal stenosis at L2-L3 and L3-L4.  CT of head from 09/10/23 CT CERVICAL SPINE FINDINGS   Alignment: Mild anterolisthesis of C2 on C3 and C4 on C5 is degenerate etiology and unchanged from the prior. No traumatic malalignment.   Skull base and vertebrae: No evidence of acute fracture. Vertebral body heights are maintained. Solid C5-C6 ACDF.   Soft tissues and spinal canal: No prevertebral fluid or swelling. No visible canal hematoma.   Disc levels: Similar multilevel degenerative change.   Upper chest: Visualized lung apices are clear.   IMPRESSION: No evidence of acute abnormality intracranially or in the cervical spine.  COGNITION: Overall cognitive status: Within functional limits for tasks assessed   SENSATION: Pt reports bilateral numbness/tingling in distal BLEs from feet to knees    POSTURE: rounded shoulders, forward head, increased thoracic kyphosis, posterior pelvic tilt, and weight shift right  LOWER EXTREMITY ROM:     Active  Right Eval Left Eval  Hip flexion    Hip extension    Hip abduction    Hip adduction    Hip internal rotation    Hip external  rotation    Knee flexion    Knee extension    Ankle dorsiflexion    Ankle plantarflexion    Ankle inversion    Ankle eversion     (Blank rows = not tested)  LOWER EXTREMITY MMT:    MMT Right Eval Left Eval  Hip flexion    Hip extension    Hip abduction    Hip adduction    Hip internal rotation    Hip external rotation    Knee flexion    Knee extension    Ankle dorsiflexion    Ankle plantarflexion    Ankle inversion    Ankle eversion    (Blank rows = not tested)  BED MOBILITY:  Not tested Pt reports difficulty getting out of the bed due to back pain and has increased difficulty getting out on the R side   TRANSFERS: Sit to stand: SBA  Assistive device utilized: None     Stand to sit: SBA  Assistive device utilized: None      RAMP:  Not tested  CURB:  Not tested  STAIRS: Not tested GAIT: Gait pattern: step through pattern, decreased step length- Right, decreased stance time- Left, decreased stride length, decreased ankle dorsiflexion- Right, knee flexed in stance- Right, knee flexed in stance- Left, lateral hip instability, lateral lean- Right, decreased trunk rotation, trunk flexed, and poor foot clearance- Right Distance walked: Various clinic distances  Assistive device utilized: Single point cane and None Level of assistance: SBA Comments: Pt presented to session carrying SPC but did not use it during session. Noted continued truncal lean to R side but no overt LOB noted.    VITALS  Vitals:   11/30/23 1110 11/30/23 1113  BP: (!) 96/55 (!) 92/51  Pulse: 79 85                                                                                                                                    TREATMENT:    Therapeutic Activity:  Vitals:   11/30/23 1110 11/30/23 1113  BP: (!) 96/55 (!) 92/51  Pulse: 79 85   Assessed BP in seated and standing  Pt denies any lightheadedness or symptoms, pt reports that he took his medication this morning and has  been staying hydrated at home.  Pt going to reach out to his physician regarding this and going to ask about compression socks. Discussed compression socks might be a good idea during therapy sessions to help keep BP up   NuStep with BLE/BUE at Gear 5.0 for 8 minutes for neural priming, reciprocal movement patterns, incr amplitude of stepping.  Pt reporting RPE 7/10   While pt on the NuStep, discussed community classes for PD - pt reports that he is aware of them from before, but has not gone. 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 Pt looking into getting silver sneakers and getting back to the [facility]. Provided pt with community resources handout for PD and went over local exercise classes as well as online options. Pt to look into these for a community class after being discharged from PT.   Assessed BP in standing after NuStep at 11:27 AM: 96/52, HR: 83 bpm Pt continues to be asymptomatic   Performed bed mobility techniques with log roll (laying down on R side), then lying supine and working on rolling to R side with large movements and then pressing up to siting, performed 3 reps with pt having no issues and reports it is easier for him to get out of bed using this technique      PATIENT EDUCATION: Education details: see above, reaching out to his physician regarding lower BP  Person educated: Patient Education method: Programmer, Multimedia, Demonstration, Verbal cues, and Handouts Education comprehension: verbalized understanding, returned demonstration, verbal cues required, and needs further education  HOME EXERCISE PROGRAM: From previous POC:  Standing PWR moves Multi-directional stepping sheet  Pt also has a stretching program he has at home.    Access Code: [email or web address] URL: [email or web address] Date: 11/09/2023 Prepared by: Marlon Plaster  Exercises - Heel Toe Raises with Counter Support  - 1 x daily - 5 x weekly - 1-2 sets - 10 reps - Standing Single Leg Stance with  Counter Support  - 1 x daily - 5 x weekly - 3 sets - 10-15 hold - Sidelying Open Book Thoracic  Lumbar Rotation and Extension  - 1 x daily - 7 x weekly - 3 sets - 10 reps - Standing Near Stance in [place] with Eyes Closed  - 1 x daily - 7 x weekly - 3 sets - 30-45 seconds  hold  GOALS: Goals reviewed with patient? Yes  SHORT TERM GOALS: Target date: 11/24/2023   Pt will improve miniBEST to at least a 22/28 in order to demo decr fall risk.  Baseline:20/28 Goal status: INITIAL  2.  Pt will improve gait velocity to at least 2.7 ft/s w/LRAD for improved gait efficiency and independence   Baseline: 2.55 ft/s w/rollator  Goal status: INITIAL  3.  Pt will verbalize and demonstrate understanding of proper bed mobility techniques for improved independence and efficiency w/bed mobility at home  Baseline: demonstrated on 11/30/23 Goal status: MET   LONG TERM GOALS: Target date: 12/22/2023   Pt will improve miniBEST to at least a 25/28 in order to demo decr fall risk. Baseline: 20/28 Goal status: INITIAL  2.  Pt will improve gait velocity to at least 3.0 ft/s w/LRAD for improved gait efficiency and independence  Baseline: 2.55 ft/s w/rollator  Goal status: INITIAL   ASSESSMENT:  CLINICAL IMPRESSION: Pt with lower BP today in sitting and standing, but pt asymptomatic. Started session on the NuStep to try to incr BP, but it remained stable in standing. Pt planning on reaching out to physician regarding compression socks (as this has also been discussed in previous PT sessions). Did get to assess STG in regards to bed mobility, which pt has met and demonstrated improvements with log roll technique. Did not get to assess remainder of STGs due to pt's BP being lower today, but will assess at next session. Will continue per POC.     OBJECTIVE IMPAIRMENTS: Abnormal gait, decreased activity tolerance, decreased balance, decreased coordination, decreased knowledge of use of DME, decreased  mobility, difficulty walking, decreased strength, increased fascial restrictions, increased muscle spasms, impaired sensation, improper body mechanics, postural dysfunction, and pain  ACTIVITY LIMITATIONS: carrying, lifting, bending, standing, squatting, stairs, transfers, bed mobility, reach over head, locomotion level, and caring for others  PARTICIPATION LIMITATIONS: meal prep, cleaning, laundry, interpersonal relationship, shopping, community activity, and yard work  PERSONAL FACTORS: Fitness, Past/current experiences, and 1-2 comorbidities: CVA and PD are also affecting patient's functional outcome.   REHAB POTENTIAL: Good  CLINICAL DECISION MAKING: Evolving/moderate complexity  EVALUATION COMPLEXITY: Moderate  PLAN:  PT FREQUENCY: 2x/week  PT DURATION: 8 weeks  PLANNED INTERVENTIONS: 97164- PT Re-evaluation, 97750- Physical Performance Testing, 97110-Therapeutic exercises, 97530- Therapeutic activity, [id number]- Neuromuscular re-education, 97535- Self Care, [id number]- Manual therapy, [id number]- Gait training, [phone]- Aquatic Therapy, [phone]- Electrical stimulation (manual), [phone] (1-2 muscles), [id number] (3+ muscles)- Dry Needling, Patient/Family education, Balance training, Stair training, Joint mobilization, Spinal mobilization, Vestibular training, and DME instructions  PLAN FOR NEXT SESSION: Work on lateral weight shifting, functional strength of LLE, postural control. SLS stability.  Balance on unlevel surfaces, dual tasking   PERFORM 10TH VISIT PN and finish checking STGS!!! (Did not get to do them on 11/18 due to low BP in standing) Did begin to initiate talk of not using all scheduled PT visits   [place]ffield LOISE Senate, PT, DPT 11/30/2023, 11:50 AM

## 2023-12-03 ENCOUNTER — Ambulatory Visit: Admitting: Physical Therapy

## 2023-12-03 ENCOUNTER — Ambulatory Visit: Admitting: Occupational Therapy

## 2023-12-03 VITALS — BP 144/83 | HR 74

## 2023-12-03 DIAGNOSIS — R2681 Unsteadiness on feet: Secondary | ICD-10-CM

## 2023-12-03 DIAGNOSIS — R29898 Other symptoms and signs involving the musculoskeletal system: Secondary | ICD-10-CM

## 2023-12-03 DIAGNOSIS — Z9181 History of falling: Secondary | ICD-10-CM

## 2023-12-03 DIAGNOSIS — R278 Other lack of coordination: Secondary | ICD-10-CM

## 2023-12-03 DIAGNOSIS — M6281 Muscle weakness (generalized): Secondary | ICD-10-CM

## 2023-12-03 DIAGNOSIS — R293 Abnormal posture: Secondary | ICD-10-CM

## 2023-12-03 DIAGNOSIS — R29818 Other symptoms and signs involving the nervous system: Secondary | ICD-10-CM

## 2023-12-03 DIAGNOSIS — G250 Essential tremor: Secondary | ICD-10-CM

## 2023-12-03 NOTE — Patient Instructions (Signed)
 Suggestions for Handwriting Changes Many people with Parkinson's notice changes in their handwriting.  Handwriting often becomes small and cramped, and can become more difficult to control when writing for longer periods of time.  This handwriting change is called micrographia.  Why does micrographia occur?  Parkinson's can cause slowing of movement, and feelings of muscle stiffness in the hands and fingers.  Loss of automatic motion also affects the easy, flowing motion of handwriting.  This can impact even simple writing tasks such as signing your name or writing a shopping list.  Attempts to write quickly without thinking about forming each letter contributes to small, cramped handwriting, and may cause the hand to develop a feeling of tightness.  How can I make writing easier?  Make a deliberate effort to form each letter.  This can be hard to do at first, but is very effective in improving size and legibility of handwriting.  Use a pen grip (round or triangular shaped rubber or foam cylinders available at stationery stores or where writing materials are found) or a larger size pen to keep your hand more relaxed.  Try printing rather than writing in a cursive style. Printing causes you to pause briefly between each letter, keeping writing more legible.   Using lined paper may provide a "visual target" to keep all letters big when writing.   A ballpoint pen typically works better than felt tip or "rolling writer"/gel styles.  Rest your hand if it begins to feel "tight".  Pause briefly when you see your handwriting becoming smaller.  Avoid hurrying or trying to write long passages if you are feeling stressed or fatigued.  Practice helps!  Remind yourself to slow down, aim big, and pause often!  Perform "flicks"/PWR! Hands if your hand feels tight, your writing gets smaller, before you start writing, or if tremors increase.  Involving your team: An occupational therapist can provide  assessment and individual recommendations for improvement of your handwriting.  This handout was adapted from [email or web address] [employer]

## 2023-12-03 NOTE — Therapy (Unsigned)
 OUTPATIENT OCCUPATIONAL THERAPY PARKINSON'S TREATMENT  Patient Name: Joshua [name] MRN: [id number] DOB:May 21, 1945, 78 y.o., male Today's Date: 12/03/2023  PCP: Loreli Elsie JONETTA Mickey., MD  REFERRING PROVIDER: Evonnie Asberry RAMAN, DO  END OF SESSION:  OT End of Session - 12/03/23 1151     Visit Number 6    Number of Visits 17    Date for Recertification  01/21/24   currently only schedule until 11/26   Authorization Type [employer]    OT Start Time 1151    OT Stop Time 1230    OT Time Calculation (min) 39 min    Activity Tolerance Patient tolerated treatment well    Behavior During Therapy [facility] for tasks assessed/performed         Past Medical History:  Diagnosis Date   Allergic rhinitis    Anxiety    from chronic pain from surgery- on Cymbalta    Arthritis    Asthma    Barrett's esophagus 03/29/2014   Carotid artery disease    Carotid Doppler normal August, 2007   Coronary artery disease    Diverticulosis    Dyslipidemia    GERD (gastroesophageal reflux disease)    History of loop recorder    has since 04/04/15   HTN (hypertension)    takes Metoprolol  for PVC control   Hx of colonic polyps    adenomatous   IFG (impaired fasting glucose)    Myocardial infarction (HCC)    mild   Neuromuscular disorder (HCC)    parkinson's tremors in hands   Palpitations    Benign PVCs   Parkinson disease (HCC)    Pneumonia    Prostate cancer (HCC)    RBBB (right bundle branch block)    rate related   Shingles    Stroke [facility]) 2017   January 01, 2023,right hand weakness,   TIA (transient ischemic attack) 02/2015   Per pt, had 2 strokes   Vertigo    Past Surgical History:  Procedure Laterality Date   BACK SURGERY  2002,2009   x 6   CHOLECYSTECTOMY  11/30/2012   with IOC   COLONOSCOPY     ELECTROPHYSIOLOGIC STUDY N/A 09/26/2015   Procedure: V Tach Ablation (PVC);  Surgeon: Will Gladis Norton, MD;  Location: [facility];  Service: Cardiovascular;   Laterality: N/A;   EP IMPLANTABLE DEVICE N/A 04/04/2015   Procedure: Loop Recorder Insertion;  Surgeon: Will Gladis Norton, MD;  Location: [facility];  Service: Cardiovascular;  Laterality: N/A;   HERNIA REPAIR     laprascopic   KNEE SURGERY     DECEMBER 2017,LEFT KNEE SCOPED   LEFT HEART CATH AND CORONARY ANGIOGRAPHY N/A 02/05/2021   Procedure: LEFT HEART CATH AND CORONARY ANGIOGRAPHY;  Surgeon: Darron Deatrice LABOR, MD;  Location: [facility];  Service: Cardiovascular;  Laterality: N/A;   NECK SURGERY  2002   POLYPECTOMY     ROTATOR CUFF REPAIR Left    TEE WITHOUT CARDIOVERSION N/A 04/04/2015   Procedure: TRANSESOPHAGEAL ECHOCARDIOGRAM (TEE);  Surgeon: Redell RAMAN Shallow, MD;  Location: [facility];  Service: Cardiovascular;  Laterality: N/A;   TOTAL KNEE ARTHROPLASTY Left 09/28/2022   Procedure: TOTAL KNEE ARTHROPLASTY;  Surgeon: Edna Toribio LABOR, MD;  Location: [facility];  Service: Orthopedics;  Laterality: Left;   TOTAL KNEE ARTHROPLASTY Right 01/11/2023   Procedure: TOTAL KNEE ARTHROPLASTY;  Surgeon: Edna Toribio LABOR, MD;  Location: [facility];  Service: Orthopedics;  Laterality: Right;   TRIGGER FINGER RELEASE Left 12/28/2019  Procedure: RELEASE TRIGGER FINGER/A-1 PULLEY THUMB, MIDDLE AND RING;  Surgeon: Murrell Kuba, MD;  Location: [facility];  Service: Orthopedics;  Laterality: Left;  FAB   UPPER GASTROINTESTINAL ENDOSCOPY     V Tach ablation  09/26/2015   Patient Active Problem List   Diagnosis Date Noted   CVA (cerebral vascular accident) (HCC) 01/15/2023   Primary osteoarthritis of right knee 01/11/2023   Localized osteoarthritis of right knee 01/11/2023   Primary osteoarthritis of left knee 09/28/2022   SBO (small bowel obstruction) (HCC) 05/29/2022   Chest pain 05/26/2022   Epigastric abdominal pain 05/26/2022   Coronary artery disease involving native coronary artery of native heart without angina pectoris 05/28/2021   History of myocardial  infarction 02/05/2021   Parkinson's disease (HCC)    Malignant neoplasm of prostate (HCC) 11/01/2017   Essential tremor 12/06/2015   PVC (premature ventricular contraction) 09/26/2015   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) 04/07/2015   Acute CVA (cerebrovascular accident) (HCC) 03/06/2015   Stroke (cerebrum) (HCC) 03/05/2015   Cerebral infarction (HCC) 03/05/2015   Weakness of right upper extremity    Tremor    Ejection fraction    Vertigo    Sleep apnea    Palpitations    Hyperlipidemia with target LDL less than 70    GERD (gastroesophageal reflux disease)    HTN (hypertension)    Chest pain    RBBB (right bundle branch block)    Ejection fraction    Carotid artery disease (HCC)    ONSET DATE: 11/03/2023 (Date of referral)  REFERRING DIAG: G20.B1 (ICD-10-CM) - Parkinson's disease with dyskinesia, without mention of fluctuations   THERAPY DIAG:  Muscle weakness (generalized)  Other symptoms and signs involving the nervous system  Other lack of coordination  Other symptoms and signs involving the musculoskeletal system  Essential tremor  History of falling  Rationale for Evaluation and Treatment: Rehabilitation  SUBJECTIVE:   SUBJECTIVE STATEMENT: He remembered to bring in his binder today.   Pt accompanied by: self  PERTINENT HISTORY: Rt TKA 01/11/23 with subsequent acute Lt CVA affecting Rt side, PD, Anxiety, HTN, HLD, multiple back surgeries/fusion neck and lumbar spine,  NSTEMI, 01/2020, hx of multiple syncopal episodes, CVA in 2017   PRECAUTIONS: Fall  WEIGHT BEARING RESTRICTIONS: No  PAIN:  Are you having pain? Yes: NPRS scale: 6/10 Pain location: back Pain description: pulled Aggravating factors: exercises Relieving factors: rest, medications  FALLS: Has patient fallen in last 6 months? Yes. Number of falls 3  LIVING ENVIRONMENT: Lives with: lives with their spouse Lives in: House/apartment Stairs: Yes: External: 1 steps;  none Has following equipment at home: Single point cane, Walker - 2 wheeled, Environmental Consultant - 4 wheeled, shower chair, Grab bars, and [employer] and transport chair  PLOF: Independent; horticulturist, commercial; driving   PATIENT GOALS: To improve dishes, folding laundry, and   OBJECTIVE:  Note: Objective measures were completed at Evaluation unless otherwise noted.  HAND DOMINANCE: Right  ADLs: Overall ADLs: mod I  Eating: setup for cutting salad  Equipment: hower seat with back, Grab bars, Walk in shower, Reacher, Sock aid, Long handled shoe horn, and Long handled sponge  IADLs: Shopping: supervision Light housekeeping: mod I Meal Prep: mod I Community mobility: driving mod I Medication management: mod I Financial management: 50/50 with wife Handwriting: 25% legible or less with pen vs pencil  MOBILITY STATUS: Needs Assist: uses SPC and Hx of falls   POSTURE COMMENTS:  rounded shoulders, forward head,  increased thoracic kyphosis, posterior pelvic tilt, and weight shift right   ACTIVITY TOLERANCE: Activity tolerance: good to fair  FUNCTIONAL OUTCOME MEASURES: 11/12/2023: Fastening/unfastening 3 buttons: only able to button 2 buttons in 150 seconds  Physical performance test: PPT#2 (simulated eating) Right: 25 seconds; Left: 24 sec   PPT#4 (donning/doffing jacket): TBD  HAND FUNCTION: Grip strength: Right: 28.2 lbs  COORDINATION: 9 Hole Peg test: Right: 168 sec; Left: 57 sec Box and Blocks:  Right 25 blocks, Left 43 blocks  UE ROM:  WFL; chronic wrist injury with limited flexion and no ext.  UE MMT:   WFL  SENSATION: Moderate to severe paresthesias in R hand and fingers primarily but extends up to shoulder, worse since stroke   MUSCLE TONE: RUE: Rigidity; mild   COGNITION: Overall cognitive status: Within functional limits for tasks assessed  OBSERVATIONS: Bradykinesia and Postural tremors                                                                                                                    TREATMENT :  -Self-care/home management completed for duration as noted below including:  OT assessed buttoning time with use of button hook to fasten and hands only for unbuttoning. Patient required cues to incorporate RUE into task and review of button hook use. Pt able to improve his time as noted in goals section following education.  OT educated pt on organization of PD binder to allow for better follow-through with HEP. Therapist removed duplicate and outdated copies and provided pt with updated handwriting handout. Highlighted the HEPs all one color and educational handouts another color to improve distinction. Papers placed in page protectors for ease of turning and longevity. Tabs created to divide papers into sections.  PATIENT EDUCATION: Education details: Scientific Laboratory Technician educated: Patient Education method: Explanation, Demonstration, Actor cues, Verbal cues, and Handouts Education comprehension: verbalized understanding, returned demonstration, verbal cues required, tactile cues required, and needs further education  HOME EXERCISE PROGRAM: 11/12/2023: buttoning 11/16/2023: Big ADLs; coordination; PWR! Hands 11/26/2023: Stocking donning 12/03/2023: handwriting  GOALS:  SHORT TERM GOALS: Target date: 12/07/2023    Pt will be independent with PD specific HEP.  Baseline: not yet initiated Goal status: IN PROGRESS  2.  Pt will verbalize understanding of adapted strategies to maximize safety and independence with ADLs/IADLs.  Baseline: not yet initiated Goal status: IN PROGRESS  3.  Pt will write a sentence with no significant decrease in size and maintain 50% legibility.  Baseline: <25% legibility Goal status: INITIAL  4.  Pt will demonstrate improved fine motor coordination for ADLs as evidenced by decreasing 9 hole peg test score for each hand by at least 8 seconds.  Baseline: Right: 168 sec; Left: 57 sec Goal  status: INITIAL  5.  Pt will demonstrate improved ease with fastening buttons as evidenced by improving 3 button/unbutton time to be able to complete full assessment in 85 seconds or less.  Baseline: only able to button 2 buttons in 150 seconds - pt reported  unable to feel his fingertips at time of eval 11/16/2023: using button hook for buttoning and unbuttoning, pt able to complete in 102 seconds 11/26/2023: 72 sec following repeat education Goal status: IN PROGRESS  LONG TERM GOALS: Target date: 01/21/2024    1.  Pt will verbalize understanding of ways to prevent future PD related complications and PD community resources.  Baseline: not yet initiated Goal status: INITIAL  2.  Pt will write a short paragraph with no significant decrease in size and maintain 75% legibility.  Baseline: <25% legibility Goal status: INITIAL  3.  Pt will verbalize understanding of ways to keep thinking skills sharp and ways to compensate for STM changes in the future.  Baseline: not yet initiated Goal status: INITIAL  4.  Pt will demonstrate improved ease with feeding as evidenced by decreasing PPT#2 by at least 4 seconds.  Baseline: Right: 25 seconds; Left: 24 sec  Goal status: INITIAL  5.  Pt will demonstrate increased ease with dressing as evidenced by decreasing PPT#4 (don/ doff jacket) to 20 secs or less.  Baseline: 30 seconds Goal status: INITIAL  6.  Pt will demonstrate improved fine motor coordination for ADLs as evidenced by decreasing 9 hole peg test score for each hand by at least 12 secs  Baseline: Right: 168 sec; Left: 57 sec Goal status: INITIAL   7.  Pt will be able to place at least 8 blocks using R hand with completion of Box and Blocks test.  Baseline:  Right 25 blocks, Left 43 blocks Goal status: INITIAL  ASSESSMENT:  CLINICAL IMPRESSION: Pt verbalizes good understanding of binder usage including PD recommendations and HEP as needed to progress towards goals. Will continue  to progress towards remaining goals.    PERFORMANCE DEFICITS: in functional skills including ADLs, IADLs, coordination, dexterity, strength, Fine motor control, Gross motor control, mobility, decreased knowledge of precautions, decreased knowledge of use of DME, and UE functional use.   IMPAIRMENTS: are limiting patient from ADLs, IADLs, leisure, and social participation.   COMORBIDITIES:  may have co-morbidities  that affects occupational performance. Patient will benefit from skilled OT to address above impairments and improve overall function.  REHAB POTENTIAL: Good  PLAN:  OT FREQUENCY: 1-2x/week  OT DURATION: 8 weeks  PLANNED INTERVENTIONS: 97168 OT Re-evaluation, 97535 self care/ADL training, 02889 therapeutic exercise, 97530 therapeutic activity, 97112 neuromuscular re-education, 97750 Physical Performance Testing, functional mobility training, coping strategies training, patient/family education, and DME and/or AE instructions  RECOMMENDED OTHER SERVICES: N/A for this visit  CONSULTED AND AGREED WITH PLAN OF CARE: Patient  PLAN FOR NEXT SESSION:  review prior HEPs; how was stocking donning?; exercise chart   Jocelyn CHRISTELLA Bottom, OT 12/03/2023, 3:03 PM

## 2023-12-03 NOTE — Therapy (Signed)
 OUTPATIENT PHYSICAL THERAPY NEURO TREATMENT - 10TH VISIT PROGRESS NOTE    Patient Name: Joshua [name] MRN: [id number] DOB:09/22/1945, 78 y.o., male Today's Date: 12/03/2023   PCP: Loreli Elsie JONETTA Mickey., MD REFERRING PROVIDER: Loreli Elsie JONETTA Mickey., MD  Physical Therapy Progress Note   Dates of Reporting Period:10/27/23 - 12/03/23  See Note below for Objective Data and Assessment of Progress/Goals.    END OF SESSION:  PT End of Session - 12/03/23 1103     Visit Number 10    Number of Visits 17   Plus eval   Date for Recertification  01/05/24    Authorization Type [employer]    PT Start Time 1102    PT Stop Time 1143    PT Time Calculation (min) 41 min    Equipment Utilized During Treatment Gait belt    Activity Tolerance Patient tolerated treatment well   lower BP   Behavior During Therapy WFL for tasks assessed/performed             Past Medical History:  Diagnosis Date   Allergic rhinitis    Anxiety    from chronic pain from surgery- on Cymbalta    Arthritis    Asthma    Barrett's esophagus 03/29/2014   Carotid artery disease    Carotid Doppler normal August, 2007   Coronary artery disease    Diverticulosis    Dyslipidemia    GERD (gastroesophageal reflux disease)    History of loop recorder    has since 04/04/15   HTN (hypertension)    takes Metoprolol  for PVC control   Hx of colonic polyps    adenomatous   IFG (impaired fasting glucose)    Myocardial infarction (HCC)    mild   Neuromuscular disorder (HCC)    parkinson's tremors in hands   Palpitations    Benign PVCs   Parkinson disease (HCC)    Pneumonia    Prostate cancer (HCC)    RBBB (right bundle branch block)    rate related   Shingles    Stroke [facility]) 2017   January 01, 2023,right hand weakness,   TIA (transient ischemic attack) 02/2015   Per pt, had 2 strokes   Vertigo    Past Surgical History:  Procedure Laterality Date   BACK SURGERY  2002,2009   x 6    CHOLECYSTECTOMY  11/30/2012   with IOC   COLONOSCOPY     ELECTROPHYSIOLOGIC STUDY N/A 09/26/2015   Procedure: V Tach Ablation (PVC);  Surgeon: Will Gladis Norton, MD;  Location: [facility];  Service: Cardiovascular;  Laterality: N/A;   EP IMPLANTABLE DEVICE N/A 04/04/2015   Procedure: Loop Recorder Insertion;  Surgeon: Will Gladis Norton, MD;  Location: [facility];  Service: Cardiovascular;  Laterality: N/A;   HERNIA REPAIR     laprascopic   KNEE SURGERY     DECEMBER 2017,LEFT KNEE SCOPED   LEFT HEART CATH AND CORONARY ANGIOGRAPHY N/A 02/05/2021   Procedure: LEFT HEART CATH AND CORONARY ANGIOGRAPHY;  Surgeon: Darron Deatrice LABOR, MD;  Location: [facility];  Service: Cardiovascular;  Laterality: N/A;   NECK SURGERY  2002   POLYPECTOMY     ROTATOR CUFF REPAIR Left    TEE WITHOUT CARDIOVERSION N/A 04/04/2015   Procedure: TRANSESOPHAGEAL ECHOCARDIOGRAM (TEE);  Surgeon: Redell GORMAN Shallow, MD;  Location: [facility] ENDOSCOPY;  Service: Cardiovascular;  Laterality: N/A;   TOTAL KNEE ARTHROPLASTY Left 09/28/2022   Procedure: TOTAL KNEE ARTHROPLASTY;  Surgeon: Edna Toribio LABOR,  MD;  Location: [facility];  Service: Orthopedics;  Laterality: Left;   TOTAL KNEE ARTHROPLASTY Right 01/11/2023   Procedure: TOTAL KNEE ARTHROPLASTY;  Surgeon: Edna Toribio LABOR, MD;  Location: [facility];  Service: Orthopedics;  Laterality: Right;   TRIGGER FINGER RELEASE Left 12/28/2019   Procedure: RELEASE TRIGGER FINGER/A-1 PULLEY THUMB, MIDDLE AND RING;  Surgeon: Murrell Kuba, MD;  Location: [facility];  Service: Orthopedics;  Laterality: Left;  FAB   UPPER GASTROINTESTINAL ENDOSCOPY     V Tach ablation  09/26/2015   Patient Active Problem List   Diagnosis Date Noted   CVA (cerebral vascular accident) (HCC) 01/15/2023   Primary osteoarthritis of right knee 01/11/2023   Localized osteoarthritis of right knee 01/11/2023   Primary osteoarthritis of left knee 09/28/2022   SBO (small bowel  obstruction) (HCC) 05/29/2022   Chest pain 05/26/2022   Epigastric abdominal pain 05/26/2022   Coronary artery disease involving native coronary artery of native heart without angina pectoris 05/28/2021   History of myocardial infarction 02/05/2021   Parkinson's disease (HCC)    Malignant neoplasm of prostate (HCC) 11/01/2017   Essential tremor 12/06/2015   PVC (premature ventricular contraction) 09/26/2015   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) 04/07/2015   Acute CVA (cerebrovascular accident) (HCC) 03/06/2015   Stroke (cerebrum) (HCC) 03/05/2015   Cerebral infarction (HCC) 03/05/2015   Weakness of right upper extremity    Tremor    Ejection fraction    Vertigo    Sleep apnea    Palpitations    Hyperlipidemia with target LDL less than 70    GERD (gastroesophageal reflux disease)    HTN (hypertension)    Chest pain    RBBB (right bundle branch block)    Ejection fraction    Carotid artery disease (HCC)     ONSET DATE: 10/15/2023 (referral)   REFERRING DIAG: R29.6 (ICD-10-CM) - Recurrent falls  THERAPY DIAG:  Muscle weakness (generalized)  Other symptoms and signs involving the nervous system  Unsteadiness on feet  Abnormal posture  Other lack of coordination  Rationale for Evaluation and Treatment: Rehabilitation  SUBJECTIVE:                                                                                                                                                                                             SUBJECTIVE STATEMENT:  Pt presents w/straight cane. Wearing compression socks today. Is a bit lightheaded today but checked his BP earlier and was 132/80. No falls.   Pt accompanied by: self  PERTINENT HISTORY: Rt TKA 01/11/23 with subsequent acute Lt CVA affecting Rt side, PD, Anxiety,  HTN, HLD, multiple back surgeries/fusion neck and lumbar spine,  NSTEMI, 01/2020, hx of multiple syncopal episodes, CVA in 2017  PAIN:  Are you having  pain? Yes: NPRS scale: 6/10 Pain location: low back (L2)  Pain description: Achy  Aggravating factors: Too much movement  Relieving factors: Ablation, medications   PRECAUTIONS: Fall  RED FLAGS: None   WEIGHT BEARING RESTRICTIONS: No  FALLS: Has patient fallen in last 6 months? Yes. Number of falls 3  LIVING ENVIRONMENT: Lives with: lives with their spouse Lives in: House/apartment Stairs: Yes: External: 1 steps; none Has following equipment at home: Single point cane, Walker - 2 wheeled, Environmental Consultant - 4 wheeled, shower chair, Grab bars, and [employer] and transport chair  PLOF: Independent  PATIENT GOALS: Just to work on my balance   OBJECTIVE:  Note: Objective measures were completed at Evaluation unless otherwise noted.  DIAGNOSTIC FINDINGS: MRI of Lumbar spine from 05/15/23  IMPRESSION: 1. Operative changes of posterior fusion from L3 through S1. Interbody fusion at these levels. Prior laminotomies at L3-L4 and L5-S1. 2. Moderate foraminal stenoses on the right at L3-L4 and bilaterally at L4-L5. 3. Mild canal stenosis at L2-L3 and L3-L4.  CT of head from 09/10/23 CT CERVICAL SPINE FINDINGS   Alignment: Mild anterolisthesis of C2 on C3 and C4 on C5 is degenerate etiology and unchanged from the prior. No traumatic malalignment.   Skull base and vertebrae: No evidence of acute fracture. Vertebral body heights are maintained. Solid C5-C6 ACDF.   Soft tissues and spinal canal: No prevertebral fluid or swelling. No visible canal hematoma.   Disc levels: Similar multilevel degenerative change.   Upper chest: Visualized lung apices are clear.   IMPRESSION: No evidence of acute abnormality intracranially or in the cervical spine.  COGNITION: Overall cognitive status: Within functional limits for tasks assessed   SENSATION: Pt reports bilateral numbness/tingling in distal BLEs from feet to knees    POSTURE: rounded shoulders, forward head, increased thoracic  kyphosis, posterior pelvic tilt, and weight shift right  LOWER EXTREMITY ROM:     Active  Right Eval Left Eval  Hip flexion    Hip extension    Hip abduction    Hip adduction    Hip internal rotation    Hip external rotation    Knee flexion    Knee extension    Ankle dorsiflexion    Ankle plantarflexion    Ankle inversion    Ankle eversion     (Blank rows = not tested)  LOWER EXTREMITY MMT:    MMT Right Eval Left Eval  Hip flexion    Hip extension    Hip abduction    Hip adduction    Hip internal rotation    Hip external rotation    Knee flexion    Knee extension    Ankle dorsiflexion    Ankle plantarflexion    Ankle inversion    Ankle eversion    (Blank rows = not tested)  BED MOBILITY:  Not tested Pt reports difficulty getting out of the bed due to back pain and has increased difficulty getting out on the R side   TRANSFERS: Sit to stand: SBA  Assistive device utilized: None     Stand to sit: SBA  Assistive device utilized: None      RAMP:  Not tested  CURB:  Not tested  STAIRS: Not tested GAIT: Gait pattern: step through pattern, decreased step length- Right, decreased stance time- Left, decreased stride length, decreased ankle  dorsiflexion- Right, knee flexed in stance- Right, knee flexed in stance- Left, lateral hip instability, lateral lean- Right, decreased trunk rotation, trunk flexed, and poor foot clearance- Right Distance walked: Various clinic distances  Assistive device utilized: Single point cane and None Level of assistance: SBA Comments: Pt presented to session carrying SPC but did not use it during session. Noted continued truncal lean to R side but no overt LOB noted.    VITALS  Vitals:   12/03/23 1107  BP: (!) 144/83  Pulse: 74                                                                                                                               TREATMENT:    Self-care/home management  Assessed BP while seated in  LUE (see above) and WNL today  Discussed PT POC and pt would like to keep all remaining appointments as he feels like his balance needs work. Pt reports he is most challenged when in tight spaces w/lots of people, such as restaurants, and is fearful of tripping over a child or someone abruptly stopping in front of him. Informed pt we can work on reactive and reacting balance strategies during remainder of PT.   Physical Performance   [facility] PT Assessment - 12/03/23 1111       Ambulation/Gait   Gait velocity 32.8' over 12.09s = 2.71 ft/s   no AD, SBA        NMR  For improved reactive balance:  Resisted gait in fwd direction using black resistance band around pelvis and second therapist providing random posterolateral perturbations, x345'. Increased difficulty stepping to R side w/frequent crossover steps noted. No major LOB noted. CGA throughout. , Fwd gait w/fast switch to retro gait when posterior resistance applied w/black resistance band, x200'. No instability noted with this At ballet bar for improved postural control, ankle strategy and stability w/reaching out of BOS: On rocker board in A/P direction, reaching for Squigz on mirror w/no UE support. Pt most challenged by Dyke placed superolaterally on L side. Pt had a few LOB episodes anteriorly, but was able to catch himself using ballet bar.  Placed squigz on blue wedge and had pt stand and reach for squigz to practice safe lifting technique at home. Pt able to do well w/no LOB noted and good body mechanics.     PATIENT EDUCATION: Education details: Goals results so far, plan to continue PT, continue wearing compression socks and monitoring BP Person educated: Patient Education method: Explanation, Demonstration, and Verbal cues Education comprehension: verbalized understanding, returned demonstration, verbal cues required, and needs further education  HOME EXERCISE PROGRAM: From previous POC:  Standing PWR  moves Multi-directional stepping sheet  Pt also has a stretching program he has at home.    Access Code: [email or web address] URL: [email or web address] Date: 11/09/2023 Prepared by: Marlon Millie Forde  Exercises - Heel Toe Raises with Counter Support  - 1 x daily - 5 x weekly - 1-2  sets - 10 reps - Standing Single Leg Stance with Counter Support  - 1 x daily - 5 x weekly - 3 sets - 10-15 hold - Sidelying Open Book Thoracic Lumbar Rotation and Extension  - 1 x daily - 7 x weekly - 3 sets - 10 reps - Standing Near Stance in Corner with Eyes Closed  - 1 x daily - 7 x weekly - 3 sets - 30-45 seconds  hold  GOALS: Goals reviewed with patient? Yes  SHORT TERM GOALS: Target date: 11/24/2023   Pt will improve miniBEST to at least a 22/28 in order to demo decr fall risk.  Baseline:20/28 Goal status: INITIAL  2.  Pt will improve gait velocity to at least 2.7 ft/s w/LRAD for improved gait efficiency and independence   Baseline: 2.55 ft/s w/rollator; 2.7 ft/s w/no AD (11/21) Goal status: MET   3.  Pt will verbalize and demonstrate understanding of proper bed mobility techniques for improved independence and efficiency w/bed mobility at home  Baseline: demonstrated on 11/30/23 Goal status: MET   LONG TERM GOALS: Target date: 12/22/2023   Pt will improve miniBEST to at least a 25/28 in order to demo decr fall risk. Baseline: 20/28 Goal status: INITIAL  2.  Pt will improve gait velocity to at least 3.0 ft/s w/LRAD for improved gait efficiency and independence  Baseline: 2.55 ft/s w/rollator  Goal status: INITIAL   ASSESSMENT:  CLINICAL IMPRESSION: Emphasis of skilled PT session on STG assessment, reactive stepping strategies and stability reaching out of BOS. Pt has met STG #2 today, improving his gait speed without use of AD. Deferred MiniBest as pt requesting to work on reactive stepping today. Pt demonstrates increased difficulty stepping to R side but was able to perform via  cross-over step. Pt also challenged by overhead reaching due to chronic back pain and anterior LOB. No LOB noted w/reaching to floor but pt will benefit from further high level balance tasks to improve anticipatory and reactive balance strategies.  Will continue per POC.     OBJECTIVE IMPAIRMENTS: Abnormal gait, decreased activity tolerance, decreased balance, decreased coordination, decreased knowledge of use of DME, decreased mobility, difficulty walking, decreased strength, increased fascial restrictions, increased muscle spasms, impaired sensation, improper body mechanics, postural dysfunction, and pain  ACTIVITY LIMITATIONS: carrying, lifting, bending, standing, squatting, stairs, transfers, bed mobility, reach over head, locomotion level, and caring for others  PARTICIPATION LIMITATIONS: meal prep, cleaning, laundry, interpersonal relationship, shopping, community activity, and yard work  PERSONAL FACTORS: Fitness, Past/current experiences, and 1-2 comorbidities: CVA and PD are also affecting patient's functional outcome.   REHAB POTENTIAL: Good  CLINICAL DECISION MAKING: Evolving/moderate complexity  EVALUATION COMPLEXITY: Moderate  PLAN:  PT FREQUENCY: 2x/week  PT DURATION: 8 weeks  PLANNED INTERVENTIONS: [id number]- PT Re-evaluation, 97750- Physical Performance Testing, 97110-Therapeutic exercises, 97530- Therapeutic activity, [id number]- Neuromuscular re-education, 97535- Self Care, [id number]- Manual therapy, [id number]- Gait training, [phone]- Aquatic Therapy, [phone]- Electrical stimulation (manual), [phone] (1-2 muscles), [id number] (3+ muscles)- Dry Needling, Patient/Family education, Balance training, Stair training, Joint mobilization, Spinal mobilization, Vestibular training, and DME instructions  PLAN FOR NEXT SESSION: MiniBest. Work on lateral weight shifting, functional strength of LLE, postural control. SLS stability.  Balance on unlevel surfaces, dual tasking   Pt wants to use all scheduled PT  visits per 11/21    Keion Neels E Chett Taniguchi, PT, DPT 12/03/2023, 11:54 AM

## 2023-12-06 ENCOUNTER — Encounter: Payer: Self-pay | Admitting: Occupational Therapy

## 2023-12-06 ENCOUNTER — Ambulatory Visit: Admitting: Occupational Therapy

## 2023-12-06 ENCOUNTER — Ambulatory Visit: Admitting: Physical Therapy

## 2023-12-06 VITALS — BP 121/70 | HR 77

## 2023-12-06 DIAGNOSIS — R293 Abnormal posture: Secondary | ICD-10-CM

## 2023-12-06 DIAGNOSIS — R29898 Other symptoms and signs involving the musculoskeletal system: Secondary | ICD-10-CM

## 2023-12-06 DIAGNOSIS — M6281 Muscle weakness (generalized): Secondary | ICD-10-CM

## 2023-12-06 DIAGNOSIS — G250 Essential tremor: Secondary | ICD-10-CM

## 2023-12-06 DIAGNOSIS — R278 Other lack of coordination: Secondary | ICD-10-CM

## 2023-12-06 DIAGNOSIS — R29818 Other symptoms and signs involving the nervous system: Secondary | ICD-10-CM

## 2023-12-06 DIAGNOSIS — R2681 Unsteadiness on feet: Secondary | ICD-10-CM

## 2023-12-06 NOTE — Therapy (Signed)
 OUTPATIENT OCCUPATIONAL THERAPY PARKINSON'S TREATMENT  Patient Name: Joshua [name] MRN: [id number] DOB:1945-03-25, 78 y.o., male Today's Date: 12/06/2023  PCP: Loreli Elsie JONETTA Mickey., MD  REFERRING PROVIDER: Evonnie Asberry RAMAN, DO  END OF SESSION:  OT End of Session - 12/06/23 1013     Visit Number 7    Number of Visits 17    Date for Recertification  01/21/24   currently only schedule until 11/26   Authorization Type [employer]    OT Start Time 1012    OT Stop Time 1100    OT Time Calculation (min) 48 min    Activity Tolerance Patient tolerated treatment well    Behavior During Therapy [facility] for tasks assessed/performed         Past Medical History:  Diagnosis Date   Allergic rhinitis    Anxiety    from chronic pain from surgery- on Cymbalta    Arthritis    Asthma    Barrett's esophagus 03/29/2014   Carotid artery disease    Carotid Doppler normal August, 2007   Coronary artery disease    Diverticulosis    Dyslipidemia    GERD (gastroesophageal reflux disease)    History of loop recorder    has since 04/04/15   HTN (hypertension)    takes Metoprolol  for PVC control   Hx of colonic polyps    adenomatous   IFG (impaired fasting glucose)    Myocardial infarction (HCC)    mild   Neuromuscular disorder (HCC)    parkinson's tremors in hands   Palpitations    Benign PVCs   Parkinson disease (HCC)    Pneumonia    Prostate cancer (HCC)    RBBB (right bundle branch block)    rate related   Shingles    Stroke [facility]) 2017   January 01, 2023,right hand weakness,   TIA (transient ischemic attack) 02/2015   Per pt, had 2 strokes   Vertigo    Past Surgical History:  Procedure Laterality Date   BACK SURGERY  2002,2009   x 6   CHOLECYSTECTOMY  11/30/2012   with IOC   COLONOSCOPY     ELECTROPHYSIOLOGIC STUDY N/A 09/26/2015   Procedure: V Tach Ablation (PVC);  Surgeon: Will Gladis Norton, MD;  Location: [facility] INVASIVE CV LAB;  Service: Cardiovascular;   Laterality: N/A;   EP IMPLANTABLE DEVICE N/A 04/04/2015   Procedure: Loop Recorder Insertion;  Surgeon: Will Gladis Norton, MD;  Location: [facility] INV[facility] CV [facility];  Service: Cardiovascular;  Laterality: N/A;   HERNIA REPAIR     laprascopic   KNEE SURGERY     DECEMBER 2017,LEFT KNEE SCOPED   LEFT HEART CATH AND CORONARY ANGIOGRAPHY N/A 02/05/2021   Procedure: LEFT HEART CATH AND CORONARY ANGIOGRAPHY;  Surgeon: Darron Deatrice LABOR, MD;  Location: [facility] INV[facility]IVE CV [facility];  Service: Cardiovascular;  Laterality: N/A;   NECK SURGERY  2002   POLYPECTOMY     ROTATOR CUFF REPAIR Left    TEE WITHOUT CARDIOVERSION N/A 04/04/2015   Procedure: TRANSESOPHAGEAL ECHOCARDIOGRAM (TEE);  Surgeon: Redell RAMAN Shallow, MD;  Location: [facility] ENDOSCOPY;  Service: Cardiovascular;  Laterality: N/A;   TOTAL KNEE ARTHROPLASTY Left 09/28/2022   Procedure: TOTAL KNEE ARTHROPLASTY;  Surgeon: Edna Toribio LABOR, MD;  Location: [facility] ORS;  Service: Orthopedics;  Laterality: Left;   TOTAL KNEE ARTHROPLASTY Right 01/11/2023   Procedure: TOTAL KNEE ARTHROPLASTY;  Surgeon: Edna Toribio LABOR, MD;  Location: [facility] ORS;  Service: Orthopedics;  Laterality: Right;   TRIGGER FINGER RELEASE Left 12/28/2019  Procedure: RELEASE TRIGGER FINGER/A-1 PULLEY THUMB, MIDDLE AND RING;  Surgeon: Murrell Kuba, MD;  Location: [facility];  Service: Orthopedics;  Laterality: Left;  FAB   UPPER GASTROINTESTINAL ENDOSCOPY     V Tach ablation  09/26/2015   Patient Active Problem List   Diagnosis Date Noted   CVA (cerebral vascular accident) (HCC) 01/15/2023   Primary osteoarthritis of right knee 01/11/2023   Localized osteoarthritis of right knee 01/11/2023   Primary osteoarthritis of left knee 09/28/2022   SBO (small bowel obstruction) (HCC) 05/29/2022   Chest pain 05/26/2022   Epigastric abdominal pain 05/26/2022   Coronary artery disease involving native coronary artery of native heart without angina pectoris 05/28/2021   History of myocardial  infarction 02/05/2021   Parkinson's disease (HCC)    Malignant neoplasm of prostate (HCC) 11/01/2017   Essential tremor 12/06/2015   PVC (premature ventricular contraction) 09/26/2015   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) 04/07/2015   Acute CVA (cerebrovascular accident) (HCC) 03/06/2015   Stroke (cerebrum) (HCC) 03/05/2015   Cerebral infarction (HCC) 03/05/2015   Weakness of right upper extremity    Tremor    Ejection fraction    Vertigo    Sleep apnea    Palpitations    Hyperlipidemia with target LDL less than 70    GERD (gastroesophageal reflux disease)    HTN (hypertension)    Chest pain    RBBB (right bundle branch block)    Ejection fraction    Carotid artery disease (HCC)    ONSET DATE: 11/03/2023 (Date of referral)  REFERRING DIAG: G20.B1 (ICD-10-CM) - Parkinson's disease with dyskinesia, without mention of fluctuations   THERAPY DIAG:  Other symptoms and signs involving the nervous system  Other lack of coordination  Other symptoms and signs involving the musculoskeletal system  Essential tremor  Muscle weakness (generalized)  Unsteadiness on feet  Abnormal posture  Rationale for Evaluation and Treatment: Rehabilitation  SUBJECTIVE:   SUBJECTIVE STATEMENT: The buttons are going better. Also doing better with donning compression hoses using A/E.  Pain (pins and needles) in Rt hand 7/10 constant more from the stroke  Pt accompanied by: self  PERTINENT HISTORY: Rt TKA 01/11/23 with subsequent acute Lt CVA affecting Rt side, PD, Anxiety, HTN, HLD, multiple back surgeries/fusion neck and lumbar spine,  NSTEMI, 01/2020, hx of multiple syncopal episodes, CVA in 2017   PRECAUTIONS: Fall  WEIGHT BEARING RESTRICTIONS: No  PAIN:  Are you having pain? Yes: NPRS scale: 6/10 Pain location: back Pain description: pulled Aggravating factors: exercises Relieving factors: rest, medications  FALLS: Has patient fallen in last 6 months? Yes.  Number of falls 3  LIVING ENVIRONMENT: Lives with: lives with their spouse Lives in: House/apartment Stairs: Yes: External: 1 steps; none Has following equipment at home: Single point cane, Walker - 2 wheeled, Environmental Consultant - 4 wheeled, shower chair, Grab bars, and [employer] and transport chair  PLOF: Independent; horticulturist, commercial; driving   PATIENT GOALS: To improve dishes, folding laundry, and   OBJECTIVE:  Note: Objective measures were completed at Evaluation unless otherwise noted.  HAND DOMINANCE: Right  ADLs: Overall ADLs: mod I  Eating: setup for cutting salad  Equipment: hower seat with back, Grab bars, Walk in shower, Reacher, Sock aid, Long handled shoe horn, and Long handled sponge  IADLs: Shopping: supervision Light housekeeping: mod I Meal Prep: mod I Community mobility: driving mod I Medication management: mod I Financial management: 50/50 with wife Handwriting: 25% legible or less with pen  vs pencil  MOBILITY STATUS: Needs Assist: uses SPC and Hx of falls   POSTURE COMMENTS:  rounded shoulders, forward head, increased thoracic kyphosis, posterior pelvic tilt, and weight shift right   ACTIVITY TOLERANCE: Activity tolerance: good to fair  FUNCTIONAL OUTCOME MEASURES: 11/12/2023: Fastening/unfastening 3 buttons: only able to button 2 buttons in 150 seconds  Physical performance test: PPT#2 (simulated eating) Right: 25 seconds; Left: 24 sec   PPT#4 (donning/doffing jacket): TBD  HAND FUNCTION: Grip strength: Right: 28.2 lbs  COORDINATION: 9 Hole Peg test: Right: 168 sec; Left: 57 sec Box and Blocks:  Right 25 blocks, Left 43 blocks  UE ROM:  WFL; chronic wrist injury with limited flexion and no ext.  UE MMT:   WFL  SENSATION: Moderate to severe paresthesias in R hand and fingers primarily but extends up to shoulder, worse since stroke   MUSCLE TONE: RUE: Rigidity; mild   COGNITION: Overall cognitive status: Within functional limits for  tasks assessed  OBSERVATIONS: Bradykinesia and Postural tremors                                                                                                                   TREATMENT :   Standing: practiced donning/doffing jacket w/ cape method to don, and deliberate pull to take off sleeves so they aren't inside out. Also recommended standing against counter to zip and unzip zipper to focus on hands and not have to worry about balance. Pt practiced hooking/unhooking zipper with cues for deliberate movements and one big pull on zipper  Practiced tucking shirt into pants with big deliberate movements and compensations and deliberate movements to hook/zip pants and belt. Pt cued to stand with feet wide  Walking w/ cues to look up, walk tall and walk big  Reviewed PWR! Hands - pt demo each with min cues  Pt issued compensations strategies for tremors and reviewed. Pt also issued cervical retraction ex and posture ex in standing - see pt instructions for both  Reviewed card exercises from coordination HEP   PATIENT EDUCATION: Education details: see above Person educated: Patient Education method: Explanation, Demonstration, Tactile cues, Verbal cues, and Handouts Education comprehension: verbalized understanding, returned demonstration, verbal cues required, tactile cues required, and needs further education  HOME EXERCISE PROGRAM: 11/12/2023: buttoning 11/16/2023: Big ADLs; coordination; PWR! Hands 11/26/2023: Stocking donning 12/06/23: tremor strategies, ex's for neck and posture  GOALS:  SHORT TERM GOALS: Target date: 12/07/2023    Pt will be independent with PD specific HEP.  Baseline: not yet initiated Goal status: IN PROGRESS  2.  Pt will verbalize understanding of adapted strategies to maximize safety and independence with ADLs/IADLs.  Baseline: not yet initiated Goal status: IN PROGRESS  3.  Pt will write a sentence with no significant decrease in size and maintain  50% legibility.  Baseline: <25% legibility Goal status: INITIAL  4.  Pt will demonstrate improved fine motor coordination for ADLs as evidenced by decreasing 9 hole peg test score for each hand by at least 8  seconds.  Baseline: Right: 168 sec; Left: 57 sec Goal status: INITIAL  5.  Pt will demonstrate improved ease with fastening buttons as evidenced by improving 3 button/unbutton time to be able to complete full assessment in 85 seconds or less.  Baseline: only able to button 2 buttons in 150 seconds - pt reported unable to feel his fingertips at time of eval 11/16/2023: using button hook for buttoning and unbuttoning, pt able to complete in 102 seconds 11/26/2023: 72 sec following repeat education Goal status: IN PROGRESS  LONG TERM GOALS: Target date: 01/21/2024    1.  Pt will verbalize understanding of ways to prevent future PD related complications and PD community resources.  Baseline: not yet initiated Goal status: INITIAL  2.  Pt will write a short paragraph with no significant decrease in size and maintain 75% legibility.  Baseline: <25% legibility Goal status: INITIAL  3.  Pt will verbalize understanding of ways to keep thinking skills sharp and ways to compensate for STM changes in the future.  Baseline: not yet initiated Goal status: INITIAL  4.  Pt will demonstrate improved ease with feeding as evidenced by decreasing PPT#2 by at least 4 seconds.  Baseline: Right: 25 seconds; Left: 24 sec  Goal status: INITIAL  5.  Pt will demonstrate increased ease with dressing as evidenced by decreasing PPT#4 (don/ doff jacket) to 20 secs or less.  Baseline: 30 seconds Goal status: INITIAL  6.  Pt will demonstrate improved fine motor coordination for ADLs as evidenced by decreasing 9 hole peg test score for each hand by at least 12 secs  Baseline: Right: 168 sec; Left: 57 sec Goal status: INITIAL   7.  Pt will be able to place at least 8 blocks using R hand with  completion of Box and Blocks test.  Baseline:  Right 25 blocks, Left 43 blocks Goal status: INITIAL  ASSESSMENT:  CLINICAL IMPRESSION: Pt verbalizes good understanding of information today though may require repeat education due to issues with recall and to promote carryover. Recommend focusing on incorporation of RUE and spatial relations during his upcoming visits given noted difficulty today. Pt to bring in binder at next session for HEP review as he forgot again today  PERFORMANCE DEFICITS: in functional skills including ADLs, IADLs, coordination, dexterity, strength, Fine motor control, Gross motor control, mobility, decreased knowledge of precautions, decreased knowledge of use of DME, and UE functional use.   IMPAIRMENTS: are limiting patient from ADLs, IADLs, leisure, and social participation.   COMORBIDITIES:  may have co-morbidities  that affects occupational performance. Patient will benefit from skilled OT to address above impairments and improve overall function.  REHAB POTENTIAL: Good  PLAN:  OT FREQUENCY: 1-2x/week  OT DURATION: 8 weeks  PLANNED INTERVENTIONS: 97168 OT Re-evaluation, 97535 self care/ADL training, 02889 therapeutic exercise, 97530 therapeutic activity, 97112 neuromuscular re-education, 97750 Physical Performance Testing, functional mobility training, coping strategies training, patient/family education, and DME and/or AE instructions  RECOMMENDED OTHER SERVICES: N/A for this visit  CONSULTED AND AGREED WITH PLAN OF CARE: Patient  PLAN FOR NEXT SESSION:  review prior HEPs, assess STG's   Burnard JINNY Roads, OT 12/06/2023, 10:16 AM

## 2023-12-06 NOTE — Patient Instructions (Addendum)
 Compensation Strategies for Tremors  When eating, try the following: Eat out of bowls, divided plates, or use a plate guard (available at a medical supply store) and eat with a spoon so that you have an edge to scoop up food. Try raising your plate so that there is less distance between the plate and mouth. Try stabilizing elbows on the table or against your body. Use utensil with built-up/larger grips as they are easier to hold.  When writing, try the following:   Stabilize forearm on the table Take your time as rushing/being stressed can increase tremors. Try a felt-tipped pen, it does not glide as much.  Avoid gel pens (they move too much). Consider using pre-printed labels with your name and address (carry them with you when you go out) or you can get stamps with your address or signature on it. Use a small tape recorder to record messages/reminders for yourself. Use pens with bigger grips.  When brushing your teeth, putting on make-up, or styling hair, try the following: Use an electric toothbrush. Use items with built-up grips. Stabilize your elbows against your body or on the counter. Use long-handled brushes/combs. Use a hair dryer with a stand.  In general: Avoid stress, fatigue, or rushing as this can increase tremors. Sit down for activities that require more control/coordination.     Chin Protraction / Retraction    Slide head forward keeping chin level. Slide head back, pulling chin in. Hold each position _10__ seconds. Repeat _10__ times. Do _3-4__ sessions per day.  Posture Awareness    Stand and check posture: Jut chin, pull back to comfortable position. Tilt pelvis forward, back; be sure back is not swayed. Roll from heels to balls of feet, then distribute your weight evenly. Picture a line through spine pulling you erect. Focus on breathing. Good Posture = Better Breathing. Check ____ times per day.

## 2023-12-06 NOTE — Therapy (Incomplete)
 OUTPATIENT PHYSICAL THERAPY NEURO TREATMENT   Patient Name: Joshua [name] MRN: [id number] DOB:Nov 06, 1945, 78 y.o., male Today's Date: 12/07/2023   PCP: Loreli Elsie JONETTA Mickey., MD REFERRING PROVIDER: Loreli Elsie JONETTA Mickey., MD   END OF SESSION:  PT End of Session - 12/06/23 1736     Visit Number 11    Number of Visits 17   Plus eval   Date for Recertification  01/05/24    Authorization Type [employer]    PT Start Time 1103    PT Stop Time 1146    PT Time Calculation (min) 43 min    Equipment Utilized During Treatment Gait belt    Activity Tolerance Patient tolerated treatment well    Behavior During Therapy [facility] for tasks assessed/performed              Past Medical History:  Diagnosis Date   Allergic rhinitis    Anxiety    from chronic pain from surgery- on Cymbalta    Arthritis    Asthma    Barrett's esophagus 03/29/2014   Carotid artery disease    Carotid Doppler normal August, 2007   Coronary artery disease    Diverticulosis    Dyslipidemia    GERD (gastroesophageal reflux disease)    History of loop recorder    has since 04/04/15   HTN (hypertension)    takes Metoprolol  for PVC control   Hx of colonic polyps    adenomatous   IFG (impaired fasting glucose)    Myocardial infarction (HCC)    mild   Neuromuscular disorder (HCC)    parkinson's tremors in hands   Palpitations    Benign PVCs   Parkinson disease (HCC)    Pneumonia    Prostate cancer (HCC)    RBBB (right bundle branch block)    rate related   Shingles    Stroke [facility]) 2017   January 01, 2023,right hand weakness,   TIA (transient ischemic attack) 02/2015   Per pt, had 2 strokes   Vertigo    Past Surgical History:  Procedure Laterality Date   BACK SURGERY  2002,2009   x 6   CHOLECYSTECTOMY  11/30/2012   with IOC   COLONOSCOPY     ELECTROPHYSIOLOGIC STUDY N/A 09/26/2015   Procedure: V Tach Ablation (PVC);  Surgeon: Will Gladis Norton, MD;  Location: [facility];   Service: Cardiovascular;  Laterality: N/A;   EP IMPLANTABLE DEVICE N/A 04/04/2015   Procedure: Loop Recorder Insertion;  Surgeon: Will Gladis Norton, MD;  Location: [facility];  Service: Cardiovascular;  Laterality: N/A;   HERNIA REPAIR     laprascopic   KNEE SURGERY     DECEMBER 2017,LEFT KNEE SCOPED   LEFT HEART CATH AND CORONARY ANGIOGRAPHY N/A 02/05/2021   Procedure: LEFT HEART CATH AND CORONARY ANGIOGRAPHY;  Surgeon: Darron Deatrice LABOR, MD;  Location: [facility];  Service: Cardiovascular;  Laterality: N/A;   NECK SURGERY  2002   POLYPECTOMY     ROTATOR CUFF REPAIR Left    TEE WITHOUT CARDIOVERSION N/A 04/04/2015   Procedure: TRANSESOPHAGEAL ECHOCARDIOGRAM (TEE);  Surgeon: Redell GORMAN Shallow, MD;  Location: [facility];  Service: Cardiovascular;  Laterality: N/A;   TOTAL KNEE ARTHROPLASTY Left 09/28/2022   Procedure: TOTAL KNEE ARTHROPLASTY;  Surgeon: Edna Toribio LABOR, MD;  Location: [facility];  Service: Orthopedics;  Laterality: Left;   TOTAL KNEE ARTHROPLASTY Right 01/11/2023   Procedure: TOTAL KNEE ARTHROPLASTY;  Surgeon: Edna Toribio LABOR, MD;  Location: [facility];  Service: Orthopedics;  Laterality: Right;   TRIGGER FINGER RELEASE Left 12/28/2019   Procedure: RELEASE TRIGGER FINGER/A-1 PULLEY THUMB, MIDDLE AND RING;  Surgeon: Murrell Kuba, MD;  Location: [facility];  Service: Orthopedics;  Laterality: Left;  FAB   UPPER GASTROINTESTINAL ENDOSCOPY     V Tach ablation  09/26/2015   Patient Active Problem List   Diagnosis Date Noted   CVA (cerebral vascular accident) (HCC) 01/15/2023   Primary osteoarthritis of right knee 01/11/2023   Localized osteoarthritis of right knee 01/11/2023   Primary osteoarthritis of left knee 09/28/2022   SBO (small bowel obstruction) (HCC) 05/29/2022   Chest pain 05/26/2022   Epigastric abdominal pain 05/26/2022   Coronary artery disease involving native coronary artery of native heart without angina pectoris 05/28/2021    History of myocardial infarction 02/05/2021   Parkinson's disease (HCC)    Malignant neoplasm of prostate (HCC) 11/01/2017   Essential tremor 12/06/2015   PVC (premature ventricular contraction) 09/26/2015   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) 04/07/2015   Acute CVA (cerebrovascular accident) (HCC) 03/06/2015   Stroke (cerebrum) (HCC) 03/05/2015   Cerebral infarction (HCC) 03/05/2015   Weakness of right upper extremity    Tremor    Ejection fraction    Vertigo    Sleep apnea    Palpitations    Hyperlipidemia with target LDL less than 70    GERD (gastroesophageal reflux disease)    HTN (hypertension)    Chest pain    RBBB (right bundle branch block)    Ejection fraction    Carotid artery disease (HCC)     ONSET DATE: 10/15/2023 (referral)   REFERRING DIAG: R29.6 (ICD-10-CM) - Recurrent falls  THERAPY DIAG:  Muscle weakness (generalized)  Other symptoms and signs involving the nervous system  Other lack of coordination  Abnormal posture  Unsteadiness on feet  Rationale for Evaluation and Treatment: Rehabilitation  SUBJECTIVE:                                                                                                                                                                                             SUBJECTIVE STATEMENT:  Pt presents w/straight cane and wearing compression socks today. Patient denies falls.   Pt accompanied by: self  PERTINENT HISTORY: Rt TKA 01/11/23 with subsequent acute Lt CVA affecting Rt side, PD, Anxiety, HTN, HLD, multiple back surgeries/fusion neck and lumbar spine,  NSTEMI, 01/2020, hx of multiple syncopal episodes, CVA in 2017  PAIN:  Are you having pain? Yes: NPRS scale: 6/10 Pain location: low back (L2)  Pain description: Achy  Aggravating factors: Too  much movement  Relieving factors: Ablation, medications   PRECAUTIONS: Fall  RED FLAGS: None   WEIGHT BEARING RESTRICTIONS: No  FALLS: Has  patient fallen in last 6 months? Yes. Number of falls 3  LIVING ENVIRONMENT: Lives with: lives with their spouse Lives in: House/apartment Stairs: Yes: External: 1 steps; none Has following equipment at home: Single point cane, Walker - 2 wheeled, Environmental Consultant - 4 wheeled, shower chair, Grab bars, and [employer] and transport chair  PLOF: Independent  PATIENT GOALS: Just to work on my balance   OBJECTIVE:  Note: Objective measures were completed at Evaluation unless otherwise noted.  DIAGNOSTIC FINDINGS: MRI of Lumbar spine from 05/15/23  IMPRESSION: 1. Operative changes of posterior fusion from L3 through S1. Interbody fusion at these levels. Prior laminotomies at L3-L4 and L5-S1. 2. Moderate foraminal stenoses on the right at L3-L4 and bilaterally at L4-L5. 3. Mild canal stenosis at L2-L3 and L3-L4.  CT of head from 09/10/23 CT CERVICAL SPINE FINDINGS   Alignment: Mild anterolisthesis of C2 on C3 and C4 on C5 is degenerate etiology and unchanged from the prior. No traumatic malalignment.   Skull base and vertebrae: No evidence of acute fracture. Vertebral body heights are maintained. Solid C5-C6 ACDF.   Soft tissues and spinal canal: No prevertebral fluid or swelling. No visible canal hematoma.   Disc levels: Similar multilevel degenerative change.   Upper chest: Visualized lung apices are clear.   IMPRESSION: No evidence of acute abnormality intracranially or in the cervical spine.  COGNITION: Overall cognitive status: Within functional limits for tasks assessed   SENSATION: Pt reports bilateral numbness/tingling in distal BLEs from feet to knees    POSTURE: rounded shoulders, forward head, increased thoracic kyphosis, posterior pelvic tilt, and weight shift right  LOWER EXTREMITY ROM:     Active  Right Eval Left Eval  Hip flexion    Hip extension    Hip abduction    Hip adduction    Hip internal rotation    Hip external rotation    Knee flexion     Knee extension    Ankle dorsiflexion    Ankle plantarflexion    Ankle inversion    Ankle eversion     (Blank rows = not tested)  LOWER EXTREMITY MMT:    MMT Right Eval Left Eval  Hip flexion    Hip extension    Hip abduction    Hip adduction    Hip internal rotation    Hip external rotation    Knee flexion    Knee extension    Ankle dorsiflexion    Ankle plantarflexion    Ankle inversion    Ankle eversion    (Blank rows = not tested)  BED MOBILITY:  Not tested Pt reports difficulty getting out of the bed due to back pain and has increased difficulty getting out on the R side   TRANSFERS: Sit to stand: SBA  Assistive device utilized: None     Stand to sit: SBA  Assistive device utilized: None      RAMP:  Not tested  CURB:  Not tested  STAIRS: Not tested GAIT: Gait pattern: step through pattern, decreased step length- Right, decreased stance time- Left, decreased stride length, decreased ankle dorsiflexion- Right, knee flexed in stance- Right, knee flexed in stance- Left, lateral hip instability, lateral lean- Right, decreased trunk rotation, trunk flexed, and poor foot clearance- Right Distance walked: Various clinic distances  Assistive device utilized: Single point cane and None Level of  assistance: SBA Comments: Pt presented to session carrying SPC but did not use it during session. Noted continued truncal lean to R side but no overt LOB noted.   TREATMENT:    Therapeutic Activity: Assessed BP while seated and standing in LUE (see above) and WNL today  Discussed speech therapy needs and that he has an upcoming speech evaluation scheduled Assessed minibest for goal assessment, 24/28  VITALS  Vitals:   12/06/23 1109 12/06/23 1110  BP: 124/81 121/70  Pulse: 71 77     12/06/23 0001  Mini-BESTest  Sit To Stand 2  Rise to Toes 1  Stand on one leg (left) 1 (2.5 seconds)  Stand on one leg (right) 1 (14 seconds)  Stand on one leg - lowest score 1   Compensatory Stepping Correction - Forward 2  Compensatory Stepping Correction - Backward 2  Compensatory Stepping Correction - Left Lateral 2  Compensatory Stepping Correction - Right Lateral 2  Stepping Corredtion Lateral - lowest score 2  Stance - Feet together, eyes open, firm surface  2  Stance - Feet together, eyes closed, foam surface  1 (21 seconds)  Incline - Eyes Closed 2  Change in Gait Speed 2  Walk with head turns - Horizontal 2  Walk with pivot turns 1  Step over obstacles 2  Timed UP & GO with Dual Task 2  Mini-BEST total score 24                                                                                                                   NMR  Rockerboard toe taps to cone x 12 reps, less tolerated today with mod assist needed to prevent LOB due to increased posterior leaning. Pt expressed not sleeping much and feeling tired today so his balance doesn't feel as good as other sessions On air ex: Steps with sign - directions written with different colors, colors called out and directions performed Improved carry over with cognitive dual tasking, min assist periodically for LOB On air ex: Steps with sign and squigz grasps with opposite arm and squigz color call out - directions written with different colors, colors called out and directions performed Improved reaching out of BOS and better ability to follow colors and complete task, min assist periodically for LOB *Moderate assistance needed due to loss of balance during rockerboard and min assist needed throughout remaining tasks    PATIENT EDUCATION: Education details: Goals results so far, plan to continue PT, continue wearing compression socks and monitoring BP Person educated: Patient Education method: Explanation, Demonstration, and Verbal cues Education comprehension: verbalized understanding, returned demonstration, verbal cues required, and needs further education  HOME EXERCISE PROGRAM: From previous  POC:  Standing PWR moves Multi-directional stepping sheet  Pt also has a stretching program he has at home.    Access Code: [email or web address] URL: [email or web address] Date: 11/09/2023 Prepared by: Marlon Plaster  Exercises - Heel Toe Raises with Counter Support  - 1 x daily - 5 x weekly - 1-2 sets -  10 reps - Standing Single Leg Stance with Counter Support  - 1 x daily - 5 x weekly - 3 sets - 10-15 hold - Sidelying Open Book Thoracic Lumbar Rotation and Extension  - 1 x daily - 7 x weekly - 3 sets - 10 reps - Standing Near Stance in Corner with Eyes Closed  - 1 x daily - 7 x weekly - 3 sets - 30-45 seconds  hold  GOALS: Goals reviewed with patient? Yes  SHORT TERM GOALS: Target date: 11/24/2023   Pt will improve miniBEST to at least a 22/28 in order to demo decr fall risk.  Baseline:20/28 12/06/2023: 24/28, no AD Goal status: MET  2.  Pt will improve gait velocity to at least 2.7 ft/s w/LRAD for improved gait efficiency and independence   Baseline: 2.55 ft/s w/rollator; 2.7 ft/s w/no AD (11/21) Goal status: MET   3.  Pt will verbalize and demonstrate understanding of proper bed mobility techniques for improved independence and efficiency w/bed mobility at home  Baseline: demonstrated on 11/30/23 Goal status: MET   LONG TERM GOALS: Target date: 12/22/2023   Pt will improve miniBEST to at least a 25/28 in order to demo decr fall risk. Baseline: 20/28 Goal status: INITIAL  2.  Pt will improve gait velocity to at least 3.0 ft/s w/LRAD for improved gait efficiency and independence  Baseline: 2.55 ft/s w/rollator  Goal status: INITIAL   ASSESSMENT:  CLINICAL IMPRESSION: Emphasis of skilled PT session on miniBest assessment and cognitive dual tasking with multidirectional stepping. Pt shows improvement with minibest including improved SLS times bilaterally, left compensatory stepping and improved rhomberg with eyes closed on a compliant surface but is still at  increased risk for falls due to decreased SLS time and inability to reach full ROM with toe raises. Rockerboard for balance was less tolerable today with min-mod assist due to posteriorly leaning. Multidirectional stepping showed improvement with better ability for pt to follow directions and recall next steps without reminders, even with the addition of reaching task with squigz with min-mod assist throughout as pt was challenged outside of BOS. Patient was motivated today and feels this helps his balance a lot. Patient continues to benefit from skilled PT in order to improve above mentioned deficits and to reduce risk of falls. Will continue per POC.     OBJECTIVE IMPAIRMENTS: Abnormal gait, decreased activity tolerance, decreased balance, decreased coordination, decreased knowledge of use of DME, decreased mobility, difficulty walking, decreased strength, increased fascial restrictions, increased muscle spasms, impaired sensation, improper body mechanics, postural dysfunction, and pain  ACTIVITY LIMITATIONS: carrying, lifting, bending, standing, squatting, stairs, transfers, bed mobility, reach over head, locomotion level, and caring for others  PARTICIPATION LIMITATIONS: meal prep, cleaning, laundry, interpersonal relationship, shopping, community activity, and yard work  PERSONAL FACTORS: Fitness, Past/current experiences, and 1-2 comorbidities: CVA and PD are also affecting patient's functional outcome.   REHAB POTENTIAL: Good  CLINICAL DECISION MAKING: Evolving/moderate complexity  EVALUATION COMPLEXITY: Moderate  PLAN:  PT FREQUENCY: 2x/week  PT DURATION: 8 weeks  PLANNED INTERVENTIONS: 97164- PT Re-evaluation, 97750- Physical Performance Testing, 97110-Therapeutic exercises, 97530- Therapeutic activity, [id number]- Neuromuscular re-education, 97535- Self Care, [id number]- Manual therapy, [id number]- Gait training, [phone]- Aquatic Therapy, [phone]- Electrical stimulation (manual), [phone] (1-2 muscles),  [id number] (3+ muscles)- Dry Needling, Patient/Family education, Balance training, Stair training, Joint mobilization, Spinal mobilization, Vestibular training, and DME instructions  PLAN FOR NEXT SESSION: Work on lateral weight shifting, functional strength of LLE, postural control. SLS stability.  Balance on unlevel surfaces,  dual tasking   Pt wants to use all scheduled PT visits per 11/21   He is most challenged when in tight spaces w/lots of people, such as restaurants, and is fearful of tripping over a child or someone abruptly stopping in front of him. Informed pt we can work on reactive and reacting balance strategies during remainder of PT.  Emmalene Sherry, Student-PT, DPT 12/07/2023, 11:18 AM

## 2023-12-08 ENCOUNTER — Encounter: Payer: Self-pay | Admitting: Speech Pathology

## 2023-12-08 ENCOUNTER — Ambulatory Visit: Admitting: Physical Therapy

## 2023-12-08 ENCOUNTER — Ambulatory Visit: Admitting: Speech Pathology

## 2023-12-08 ENCOUNTER — Ambulatory Visit: Admitting: Occupational Therapy

## 2023-12-08 VITALS — BP 122/63 | HR 75

## 2023-12-08 DIAGNOSIS — G250 Essential tremor: Secondary | ICD-10-CM

## 2023-12-08 DIAGNOSIS — R2681 Unsteadiness on feet: Secondary | ICD-10-CM

## 2023-12-08 DIAGNOSIS — M6281 Muscle weakness (generalized): Secondary | ICD-10-CM

## 2023-12-08 DIAGNOSIS — R29898 Other symptoms and signs involving the musculoskeletal system: Secondary | ICD-10-CM

## 2023-12-08 DIAGNOSIS — R278 Other lack of coordination: Secondary | ICD-10-CM

## 2023-12-08 DIAGNOSIS — Z9181 History of falling: Secondary | ICD-10-CM

## 2023-12-08 DIAGNOSIS — R4701 Aphasia: Secondary | ICD-10-CM

## 2023-12-08 DIAGNOSIS — M5459 Other low back pain: Secondary | ICD-10-CM

## 2023-12-08 DIAGNOSIS — R471 Dysarthria and anarthria: Secondary | ICD-10-CM

## 2023-12-08 DIAGNOSIS — R29818 Other symptoms and signs involving the nervous system: Secondary | ICD-10-CM

## 2023-12-08 NOTE — Therapy (Signed)
 OUTPATIENT OCCUPATIONAL THERAPY [facility]  Patient Name: Jadie Comas MRN: [id number] DOB:[birth year], 78 y.o., male Today's Date: 12/08/2023  PCP: Loreli Elsie JONETTA Mickey., MD  REFERRING PROVIDER: Evonnie Asberry RAMAN, DO  END OF SESSION:  OT End of Session - 12/08/23 1402     Visit Number 8    Number of Visits 17    Date for Recertification  01/21/24   currently only schedule until 11/26   Authorization Type [employer]    OT Start Time 1401    OT Stop Time 1442    OT Time Calculation (min) 41 min    Activity Tolerance Patient tolerated treatment well    Behavior During Therapy [facility] for tasks assessed/performed         Past Medical History:  Diagnosis Date   Allergic rhinitis    Anxiety    from chronic pain from surgery- on Cymbalta    Arthritis    Asthma    Barrett's esophagus 03/29/2014   Carotid artery disease    Carotid Doppler normal August, 2007   Coronary artery disease    Diverticulosis    Dyslipidemia    GERD (gastroesophageal reflux disease)    History of loop recorder    has since 04/04/15   HTN (hypertension)    takes Metoprolol  for PVC control   Hx of colonic polyps    adenomatous   IFG (impaired fasting glucose)    Myocardial infarction (HCC)    mild   Neuromuscular disorder (HCC)    parkinson's tremors in hands   Palpitations    Benign PVCs   Parkinson disease (HCC)    Pneumonia    Prostate cancer (HCC)    RBBB (right bundle branch block)    rate related   Shingles    Stroke [facility]) 2017   January 01, 2023,right hand weakness,   TIA (transient ischemic attack) 02/2015   Per pt, had 2 strokes   Vertigo    Past Surgical History:  Procedure Laterality Date   BACK SURGERY  2002,2009   x 6   CHOLECYSTECTOMY  11/30/2012   with IOC   COLONOSCOPY     ELECTROPHYSIOLOGIC STUDY N/A 09/26/2015   Procedure: V Tach Ablation (PVC);  Surgeon: Will Gladis Norton, MD;  Location: [facility] INVASIVE CV LAB;  Service: Cardiovascular;   Laterality: N/A;   EP IMPLANTABLE DEVICE N/A 04/04/2015   Procedure: Loop Recorder Insertion;  Surgeon: Will Gladis Norton, MD;  Location: [facility] INVASIVE CV [facility];  Service: Cardiovascular;  Laterality: N/A;   HERNIA REPAIR     laprascopic   KNEE SURGERY     DECEMBER 2017,LEFT KNEE SCOPED   LEFT HEART CATH AND CORONARY ANGIOGRAPHY N/A 02/05/2021   Procedure: LEFT HEART CATH AND CORONARY ANGIOGRAPHY;  Surgeon: Darron Deatrice LABOR, MD;  Location: [facility] INVASIVE CV LAB;  Service: Cardiovascular;  Laterality: N/A;   NECK SURGERY  2002   POLYPECTOMY     ROTATOR CUFF REPAIR Left    TEE WITHOUT CARDIOVERSION N/A 04/04/2015   Procedure: TRANSESOPHAGEAL ECHOCARDIOGRAM (TEE);  Surgeon: Redell RAMAN Shallow, MD;  Location: [facility] ENDOSCOPY;  Service: Cardiovascular;  Laterality: N/A;   TOTAL KNEE ARTHROPLASTY Left 09/28/2022   Procedure: TOTAL KNEE ARTHROPLASTY;  Surgeon: Edna Toribio LABOR, MD;  Location: [facility] ORS;  Service: Orthopedics;  Laterality: Left;   TOTAL KNEE ARTHROPLASTY Right 01/11/2023   Procedure: TOTAL KNEE ARTHROPLASTY;  Surgeon: Edna Toribio LABOR, MD;  Location: [facility] ORS;  Service: Orthopedics;  Laterality: Right;   TRIGGER FINGER RELEASE Left 12/28/2019  Procedure: RELEASE TRIGGER FINGER/A-1 PULLEY THUMB, MIDDLE AND RING;  Surgeon: Murrell Kuba, MD;  Location: [facility];  Service: Orthopedics;  Laterality: Left;  FAB   UPPER GASTROINTESTINAL ENDOSCOPY     V Tach ablation  09/26/2015   Patient Active Problem List   Diagnosis Date Noted   CVA (cerebral vascular accident) (HCC) 01/15/2023   Primary osteoarthritis of right knee 01/11/2023   Localized osteoarthritis of right knee 01/11/2023   Primary osteoarthritis of left knee 09/28/2022   SBO (small bowel obstruction) (HCC) 05/29/2022   Chest pain 05/26/2022   Epigastric abdominal pain 05/26/2022   Coronary artery disease involving native coronary artery of native heart without angina pectoris 05/28/2021   History of myocardial  infarction 02/05/2021   Parkinson's disease (HCC)    Malignant neoplasm of prostate (HCC) 11/01/2017   Essential tremor 12/06/2015   PVC (premature ventricular contraction) 09/26/2015   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) 04/07/2015   Acute CVA (cerebrovascular accident) (HCC) 03/06/2015   Stroke (cerebrum) (HCC) 03/05/2015   Cerebral infarction (HCC) 03/05/2015   Weakness of right upper extremity    Tremor    Ejection fraction    Vertigo    Sleep apnea    Palpitations    Hyperlipidemia with target LDL less than 70    GERD (gastroesophageal reflux disease)    HTN (hypertension)    Chest pain    RBBB (right bundle branch block)    Ejection fraction    Carotid artery disease (HCC)    ONSET DATE: 11/03/2023 (Date of referral)  REFERRING DIAG: G20.B1 (ICD-10-CM) - Parkinson's disease with dyskinesia, without mention of fluctuations   THERAPY DIAG:  Other symptoms and signs involving the nervous system  Other lack of coordination  Other symptoms and signs involving the musculoskeletal system  Essential tremor  Muscle weakness (generalized)  History of falling  Rationale for Evaluation and Treatment: Rehabilitation  SUBJECTIVE:   SUBJECTIVE STATEMENT: He brought in his PD binder again. He did not realize today was his last scheduled OT visit.   Pt accompanied by: self  PERTINENT HISTORY: Rt TKA 01/11/23 with subsequent acute Lt CVA affecting Rt side, PD, Anxiety, HTN, HLD, multiple back surgeries/fusion neck and lumbar spine,  NSTEMI, 01/2020, hx of multiple syncopal episodes, CVA in 2017   PRECAUTIONS: Fall  WEIGHT BEARING RESTRICTIONS: No  PAIN:  Are you having pain? Yes: NPRS scale: 6/10 Pain location: back Pain description: pulled Aggravating factors: exercises Relieving factors: rest, medications  FALLS: Has patient fallen in last 6 months? Yes. Number of falls 3  LIVING ENVIRONMENT: Lives with: lives with their spouse Lives in:  House/apartment Stairs: Yes: External: 1 steps; none Has following equipment at home: Single point cane, Walker - 2 wheeled, Environmental Consultant - 4 wheeled, shower chair, Grab bars, and [employer] and transport chair  PLOF: Independent; horticulturist, commercial; driving   PATIENT GOALS: To improve dishes, folding laundry, and   OBJECTIVE:  Note: Objective measures were completed at Evaluation unless otherwise noted.  HAND DOMINANCE: Right  ADLs: Overall ADLs: mod I  Eating: setup for cutting salad  Equipment: hower seat with back, Grab bars, Walk in shower, Reacher, Sock aid, Long handled shoe horn, and Long handled sponge  IADLs: Shopping: supervision Light housekeeping: mod I Meal Prep: mod I Community mobility: driving mod I Medication management: mod I Financial management: 50/50 with wife Handwriting: 25% legible or less with pen vs pencil  MOBILITY STATUS: Needs Assist: uses SPC and Hx of  falls   POSTURE COMMENTS:  rounded shoulders, forward head, increased thoracic kyphosis, posterior pelvic tilt, and weight shift right   ACTIVITY TOLERANCE: Activity tolerance: good to fair  FUNCTIONAL OUTCOME MEASURES: 11/12/2023: Fastening/unfastening 3 buttons: only able to button 2 buttons in 150 seconds  Physical performance test: PPT#2 (simulated eating) Right: 25 seconds; Left: 24 sec   PPT#4 (donning/doffing jacket): TBD  HAND FUNCTION: Grip strength: Right: 28.2 lbs  COORDINATION: 9 Hole Peg test: Right: 168 sec; Left: 57 sec Box and Blocks:  Right 25 blocks, Left 43 blocks  UE ROM:  WFL; chronic wrist injury with limited flexion and no ext.  UE MMT:   WFL  SENSATION: Moderate to severe paresthesias in R hand and fingers primarily but extends up to shoulder, worse since stroke   MUSCLE TONE: RUE: Rigidity; mild   COGNITION: Overall cognitive status: Within functional limits for tasks assessed  OBSERVATIONS: Bradykinesia and Postural tremors                                                                                                                    TREATMENT :   OT assessed and educated pt on additional writing strategies including use of dycem, weight bearing through BUE, and use of weighted writing utensil. Pt trialed use of Sharpie Pen with less accuracy.   OT adjusted phone settings for dexterity secondary to tremor to reduce errors. Pt demonstrated improved accuracy. Will bring in tablet(s) during upcoming OT visit for additional adjustments as needed.   PATIENT EDUCATION: Education details: see above Person educated: Patient Education method: Explanation, Demonstration, Tactile cues, and Verbal cues Education comprehension: verbalized understanding, returned demonstration, verbal cues required, tactile cues required, and needs further education  HOME EXERCISE PROGRAM: 11/12/2023: buttoning 11/16/2023: Big ADLs; coordination; PWR! Hands 11/26/2023: Stocking donning 12/06/23: tremor strategies, ex's for neck and posture  GOALS:  SHORT TERM GOALS: Target date: 12/07/2023    Pt will be independent with PD specific HEP.  Baseline: not yet initiated Goal status: IN PROGRESS  2.  Pt will verbalize understanding of adapted strategies to maximize safety and independence with ADLs/IADLs.  Baseline: not yet initiated Goal status: IN PROGRESS  3.  Pt will write a sentence with no significant decrease in size and maintain 50% legibility.  Baseline: <25% legibility 12/08/2023: no significant increase to size, use of weighted pen, and >50% legibility Goal status: MET  4.  Pt will demonstrate improved fine motor coordination for ADLs as evidenced by decreasing 9 hole peg test score for each hand by at least 8 seconds.  Baseline: Right: 168 sec; Left: 57 sec Goal status: INITIAL  5.  Pt will demonstrate improved ease with fastening buttons as evidenced by improving 3 button/unbutton time to be able to complete full assessment in 85 seconds  or less.  Baseline: only able to button 2 buttons in 150 seconds - pt reported unable to feel his fingertips at time of eval 11/16/2023: using button hook for buttoning and unbuttoning, pt able to complete  in 102 seconds 11/26/2023: 72 sec following repeat education Goal status: IN PROGRESS  LONG TERM GOALS: Target date: 01/21/2024    1.  Pt will verbalize understanding of ways to prevent future PD related complications and PD community resources.  Baseline: not yet initiated Goal status: INITIAL  2.  Pt will write a short paragraph with no significant decrease in size and maintain 75% legibility.  Baseline: <25% legibility Goal status: INITIAL  3.  Pt will verbalize understanding of ways to keep thinking skills sharp and ways to compensate for STM changes in the future.  Baseline: not yet initiated Goal status: INITIAL  4.  Pt will demonstrate improved ease with feeding as evidenced by decreasing PPT#2 by at least 4 seconds.  Baseline: Right: 25 seconds; Left: 24 sec  Goal status: INITIAL  5.  Pt will demonstrate increased ease with dressing as evidenced by decreasing PPT#4 (don/ doff jacket) to 20 secs or less.  Baseline: 30 seconds Goal status: INITIAL  6.  Pt will demonstrate improved fine motor coordination for ADLs as evidenced by decreasing 9 hole peg test score for each hand by at least 12 secs  Baseline: Right: 168 sec; Left: 57 sec Goal status: INITIAL   7.  Pt will be able to place at least 8 blocks using R hand with completion of Box and Blocks test.  Baseline:  Right 25 blocks, Left 43 blocks Goal status: INITIAL  ASSESSMENT:  CLINICAL IMPRESSION: Pt demonstrates good understanding of writing strategies including use of adaptive writing utensils; however, recommend pt to being in purchased pen to make specific recommendations as needed. Pt to schedule additional OT visits per original POC. Continue to work and progress toward remaining goals.   PERFORMANCE  DEFICITS: in functional skills including ADLs, IADLs, coordination, dexterity, strength, Fine motor control, Gross motor control, mobility, decreased knowledge of precautions, decreased knowledge of use of DME, and UE functional use.   IMPAIRMENTS: are limiting patient from ADLs, IADLs, leisure, and social participation.   COMORBIDITIES:  may have co-morbidities  that affects occupational performance. Patient will benefit from skilled OT to address above impairments and improve overall function.  REHAB POTENTIAL: Good  PLAN:  OT FREQUENCY: 1-2x/week  OT DURATION: 8 weeks  PLANNED INTERVENTIONS: 97168 OT Re-evaluation, 97535 self care/ADL training, 02889 therapeutic exercise, 97530 therapeutic activity, 97112 neuromuscular re-education, 97750 Physical Performance Testing, functional mobility training, coping strategies training, patient/family education, and DME and/or AE instructions  RECOMMENDED OTHER SERVICES: N/A for this visit  CONSULTED AND AGREED WITH PLAN OF CARE: Patient  PLAN FOR NEXT SESSION:  review prior HEPs, assess STGs Pt to bring in adapted pen to demo use   Jocelyn CHRISTELLA Bottom, OT 12/08/2023, 3:55 PM

## 2023-12-08 NOTE — Therapy (Signed)
 OUTPATIENT SPEECH LANGUAGE PATHOLOGY PARKINSON'S EVALUATION   Patient Name: Joshua [name] MRN: [id number] DOB:Oct 01, 1945, 78 y.o., male Today's Date: 12/08/2023  PCP: Joshua Gentry, MD REFERRING PROVIDER: Elsie Gentry, MD  END OF SESSION:  End of Session - 12/08/23 1452     Visit Number 1    Number of Visits 17    Date for Recertification  02/02/24    SLP Start Time 1445    SLP Stop Time  1530    SLP Time Calculation (min) 45 min    Activity Tolerance Patient tolerated treatment well          Past Medical History:  Diagnosis Date   Allergic rhinitis    Anxiety    from chronic pain from surgery- on Cymbalta    Arthritis    Asthma    Barrett's esophagus 03/29/2014   Carotid artery disease    Carotid Doppler normal August, 2007   Coronary artery disease    Diverticulosis    Dyslipidemia    GERD (gastroesophageal reflux disease)    History of loop recorder    has since 04/04/15   HTN (hypertension)    takes Metoprolol  for PVC control   Hx of colonic polyps    adenomatous   IFG (impaired fasting glucose)    Myocardial infarction (HCC)    mild   Neuromuscular disorder (HCC)    parkinson's tremors in hands   Palpitations    Benign PVCs   Parkinson disease (HCC)    Pneumonia    Prostate cancer (HCC)    RBBB (right bundle branch block)    rate related   Shingles    Stroke [facility]) 2017   January 01, 2023,right hand weakness,   TIA (transient ischemic attack) 02/2015   Per pt, had 2 strokes   Vertigo    Past Surgical History:  Procedure Laterality Date   BACK SURGERY  2002,2009   x 6   CHOLECYSTECTOMY  11/30/2012   with IOC   COLONOSCOPY     ELECTROPHYSIOLOGIC STUDY N/A 09/26/2015   Procedure: V Tach Ablation (PVC);  Surgeon: Will Joshua Norton, MD;  Location: [facility] INVASIVE CV LAB;  Service: Cardiovascular;  Laterality: N/A;   EP IMPLANTABLE DEVICE N/A 04/04/2015   Procedure: Loop Recorder Insertion;  Surgeon: Will Joshua Norton, MD;  Location: [facility]  INVASIVE CV LAB;  Service: Cardiovascular;  Laterality: N/A;   HERNIA REPAIR     laprascopic   KNEE SURGERY     DECEMBER 2017,LEFT KNEE SCOPED   LEFT HEART CATH AND CORONARY ANGIOGRAPHY N/A 02/05/2021   Procedure: LEFT HEART CATH AND CORONARY ANGIOGRAPHY;  Surgeon: Joshua Deatrice LABOR, MD;  Location: [facility] INVASIVE CV LAB;  Service: Cardiovascular;  Laterality: N/A;   NECK SURGERY  2002   POLYPECTOMY     ROTATOR CUFF REPAIR Left    TEE WITHOUT CARDIOVERSION N/A 04/04/2015   Procedure: TRANSESOPHAGEAL ECHOCARDIOGRAM (TEE);  Surgeon: Joshua GORMAN Shallow, MD;  Location: [facility] ENDOSCOPY;  Service: Cardiovascular;  Laterality: N/A;   TOTAL KNEE ARTHROPLASTY Left 09/28/2022   Procedure: TOTAL KNEE ARTHROPLASTY;  Surgeon: Joshua Toribio LABOR, MD;  Location: [facility] ORS;  Service: Orthopedics;  Laterality: Left;   TOTAL KNEE ARTHROPLASTY Right 01/11/2023   Procedure: TOTAL KNEE ARTHROPLASTY;  Surgeon: Joshua Toribio LABOR, MD;  Location: [facility] ORS;  Service: Orthopedics;  Laterality: Right;   TRIGGER FINGER RELEASE Left 12/28/2019   Procedure: RELEASE TRIGGER FINGER/A-1 PULLEY THUMB, MIDDLE AND RING;  Surgeon: Joshua Kuba, MD;  Location: [facility];  Service:  Orthopedics;  Laterality: Left;  FAB   UPPER GASTROINTESTINAL ENDOSCOPY     V Tach ablation  09/26/2015   Patient Active Problem List   Diagnosis Date Noted   CVA (cerebral vascular accident) (HCC) 01/15/2023   Primary osteoarthritis of right knee 01/11/2023   Localized osteoarthritis of right knee 01/11/2023   Primary osteoarthritis of left knee 09/28/2022   SBO (small bowel obstruction) (HCC) 05/29/2022   Chest pain 05/26/2022   Epigastric abdominal pain 05/26/2022   Coronary artery disease involving native coronary artery of native heart without angina pectoris 05/28/2021   History of myocardial infarction 02/05/2021   Parkinson's disease (HCC)    Malignant neoplasm of prostate (HCC) 11/01/2017   Essential tremor 12/06/2015   PVC  (premature ventricular contraction) 09/26/2015   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) 04/07/2015   Acute CVA (cerebrovascular accident) (HCC) 03/06/2015   Stroke (cerebrum) (HCC) 03/05/2015   Cerebral infarction (HCC) 03/05/2015   Weakness of right upper extremity    Tremor    Ejection fraction    Vertigo    Sleep apnea    Palpitations    Hyperlipidemia with target LDL less than 70    GERD (gastroesophageal reflux disease)    HTN (hypertension)    Chest pain    RBBB (right bundle branch block)    Ejection fraction    Carotid artery disease (HCC)     ONSET DATE: 11/03/2023 referral   REFERRING DIAG: G20.B1 (ICD-10-CM) - Parkinson's disease with dyskinesia, without mention of fluctuations   THERAPY DIAG:  Dysarthria and anarthria  Rationale for Evaluation and Treatment: Rehabilitation  SUBJECTIVE:   SUBJECTIVE STATEMENT: I get tongue tied and can't get my words out  Pt accompanied by: self  PERTINENT HISTORY: Rt TKA 01/11/23 with subsequent acute Lt CVA affecting Rt side, PD, Anxiety, HTN, HLD, multiple back surgeries/fusion neck and lumbar spine,  NSTEMI, 01/2020, hx of multiple syncopal episodes, CVA in 2017. Known to ST service from prior episodes addressing dysarthria.   PAIN:  Are you having pain? Yes: NPRS scale: 6-7 Pain location: R hand, back, neck Pain description: chronic pain Aggravating factors: stress Relieving factors: sitting quietly  FALLS: Has patient fallen in last 6 months?  See PT evaluation for details  LIVING ENVIRONMENT: Lives with: lives with their spouse, Joshua [name]  Lives in: House/apartment  PLOF:  Level of assistance: Independent with ADLs, Independent with IADLs Employment: Retired  PATIENT GOALS: to prevent decline, work on word finding  OBJECTIVE:  Note: Objective measures were completed at Evaluation unless otherwise noted.  COGNITION: Overall cognitive status: Within functional limits for tasks  assessed  MOTOR SPEECH: assessed across variety of speech tasks: reading, word repetition, generative discourse sample Overall motor speech: impaired Level of impairment: Phrase Rate of Speech: WFL Dysfluencies: stutter like dysfluencies (sound/syllable repetitions) -- pt reports happens usually if pt is attempting to find right vocabulary  Phonation: hoarse  Sustained phonation duration: 25 seconds Oral reading loudness average: 75 dB Conversational loudness average: 74 dB Voice Quality: hoarse and harsh  S/Z: WNL Respiration: thoracic breathing Word and Phrasal Stress: WFL Resonance: WFL Articulation: Appears intact Diadochokinetic Rate (DDK): irregular rhythm and poorly sequenced Intelligibility: Intelligible Motor planning: Appears intact   Stimulability trials: Given SLP modeling, pt demonstrates reduced hoarseness and increased volume during sustained phonation. Does best with ee and ooh   LANGUAGE Auditory Comprehension: Impaired with increased complexity, intact for basic conversation Following directions-- intact Story Retell -- 12 details of potential 21 Spoken Expression:  Naming: Confrontation -- intact; Divergent -- reduced, pt names x9 animals in 1 minute Verbal reasoning -- reduced, speech is tangential and fails to connect key concepts with increased complexity Discourse -- intact for simple conversation but challenges evidenced with moderate complexity Comments -- pt reports losing train of thought and anomia impacting successful participation in conversation   ORAL MOTOR EXAMINATION: Overall status: WFL Comments: lingual fasiculations, suspected tremor when voicing  PATIENT REPORTED OUTCOME MEASURES (PROM): The Communicative Participation Item Bank        Does your condition interfere with... Pt Rating   ...talking with people you know 1   ...communicating when you need to say something quickly 1   ...talking with people you do not know 0    ...communicating when you are out in your community 2   ...asking questions in a conversation 1   ....communicating in a small group of people 1   ...having a long conversation 1   ...giving detailed infomrmation 1   ...getting your turn in a fast moving conversation 1   ...trying to persuade a friend or family member to see a different point of view 3  TOTAL 12  3= Not at all; 2=A little; 1=Quite a bit; 0=Very much                                                                                                                        TREATMENT DATE:  12/08/2023: Education provided on evaluation results and SLP's recommendations. Initiated training re: self-advocacy for communication, asking for time, HEP recommendations. Pt verbalizes agreement with POC, all questions answered to satisfaction.    PATIENT EDUCATION: Education details: Tour Manager Person educated: Patient Education method: Explanation Education comprehension: verbalized understanding  HOME EXERCISE PROGRAM: Speak Out daily   GOALS: Goals reviewed with patient? Yes  SHORT TERM GOALS: Target date: 01/05/2024  Pt will complete warm up exercises from Speak Out! with strong, clear vocal quality, exceeding target dB in all trials over 2 sessions  Baseline: Goal status: INITIAL  2.  Pt will meet or exceed target dB for paragraph level readings over 2 sessions with min-A Baseline:  Goal status: INITIAL  3.  Pt will complete moderately complex discourse level structured language tasks and utilize verbal compensations for anomia in 90% of opportunities  Baseline:  Goal status: INITIAL  4.  Pt will teach back anomia compensations/strategies with mod-I Baseline:  Goal status: INITIAL   LONG TERM GOALS: Target date: 02/02/2024  Pt will verbalize x5 HEP activities for word-finding by d/c Baseline:  Goal status: INITIAL  2.  Pt will report improvement via PROM by at least 2 points by d/c  Baseline:  Goal status:  INITIAL  3.  Pt will listen to 5 minute video/audio and verbally summarize key details in 2/2 opportunities  Baseline:  Goal status: INITIAL  4.  Pt will utilize anomia strategies/compensations in unstructured conversation with min-A Baseline:  Goal status: INITIAL  ASSESSMENT:  CLINICAL IMPRESSION: Patient is a  78 y.o. M who was seen today for motor speech and language evaluation in setting of PD and hx of CVA. Pt reports since stroke last year, he has been having trouble with word finding, significantly impacting his confidence in communication across communication partners (see PROM). During today's eval, pt's spoken expression is functional for simple conversation but challenges evidenced during tasks with increased complexity with pt demonstrating tangential speech without connecting key information to correctly answer questions (e.g., inference questions, asking for similarities). Auditory comprehension breakdowns evidenced with complex syntax. Pt denies cognitive concerns, challenges swallowing. Desires to continue working on voice/speech in setting of PD. Volume is WNL today but voice is very hoarse/rough. Quality improved during sustained ee and ooh but not with cued intent for ah or reading. I recommend skilled ST to address verbal expression and motor speech.   OBJECTIVE IMPAIRMENTS: Objective impairments include expressive language and dysarthria. These impairments are limiting patient from effectively communicating at home and in community.Factors affecting potential to achieve goals and functional outcome are co-morbidities.. Patient will benefit from skilled SLP services to address above impairments and improve overall function.  REHAB POTENTIAL: Good  PLAN:  SLP FREQUENCY: 2x/week  SLP DURATION: 8 weeks  PLANNED INTERVENTIONS: Cueing hierachy, Internal/external aids, Functional tasks, SLP instruction and feedback, Compensatory strategies, Patient/family education,  [phone] Treatment of speech (30 or 45 min) , and [id number]- Speech [id number], [place], [place], Eval Compre, Express   Harlene LITTIE Ned, CCC-SLP 12/08/2023, 2:55 PM

## 2023-12-08 NOTE — Therapy (Signed)
 OUTPATIENT PHYSICAL THERAPY NEURO TREATMENT   Patient Name: Joshua [name] MRN: [id number] DOB:1945/11/21, 78 y.o., male Today's Date: 12/08/2023   PCP: Loreli Elsie JONETTA Mickey., MD REFERRING PROVIDER: Loreli Elsie JONETTA Mickey., MD   END OF SESSION:  PT End of Session - 12/08/23 1320     Visit Number 12    Number of Visits 17   Plus eval   Date for Recertification  01/05/24    Authorization Type [employer]    PT Start Time 1318    PT Stop Time 1359    PT Time Calculation (min) 41 min    Equipment Utilized During Treatment Gait belt    Activity Tolerance Other (comment);Patient limited by pain   Hypotension   Behavior During Therapy [facility] for tasks assessed/performed              Past Medical History:  Diagnosis Date   Allergic rhinitis    Anxiety    from chronic pain from surgery- on Cymbalta    Arthritis    Asthma    Barrett's esophagus 03/29/2014   Carotid artery disease    Carotid Doppler normal August, 2007   Coronary artery disease    Diverticulosis    Dyslipidemia    GERD (gastroesophageal reflux disease)    History of loop recorder    has since 04/04/15   HTN (hypertension)    takes Metoprolol  for PVC control   Hx of colonic polyps    adenomatous   IFG (impaired fasting glucose)    Myocardial infarction (HCC)    mild   Neuromuscular disorder (HCC)    parkinson's tremors in hands   Palpitations    Benign PVCs   Parkinson disease (HCC)    Pneumonia    Prostate cancer (HCC)    RBBB (right bundle branch block)    rate related   Shingles    Stroke [facility]) 2017   January 01, 2023,right hand weakness,   TIA (transient ischemic attack) 02/2015   Per pt, had 2 strokes   Vertigo    Past Surgical History:  Procedure Laterality Date   BACK SURGERY  2002,2009   x 6   CHOLECYSTECTOMY  11/30/2012   with IOC   COLONOSCOPY     ELECTROPHYSIOLOGIC STUDY N/A 09/26/2015   Procedure: V Tach Ablation (PVC);  Surgeon: Will Gladis Norton, MD;  Location: [facility]  INVASIVE CV LAB;  Service: Cardiovascular;  Laterality: N/A;   EP IMPLANTABLE DEVICE N/A 04/04/2015   Procedure: Loop Recorder Insertion;  Surgeon: Will Gladis Norton, MD;  Location: [facility] INVASIVE CV LAB;  Service: Cardiovascular;  Laterality: N/A;   HERNIA REPAIR     laprascopic   KNEE SURGERY     DECEMBER 2017,LEFT KNEE SCOPED   LEFT HEART CATH AND CORONARY ANGIOGRAPHY N/A 02/05/2021   Procedure: LEFT HEART CATH AND CORONARY ANGIOGRAPHY;  Surgeon: Darron Deatrice LABOR, MD;  Location: [facility] INVASIVE CV LAB;  Service: Cardiovascular;  Laterality: N/A;   NECK SURGERY  2002   POLYPECTOMY     ROTATOR CUFF REPAIR Left    TEE WITHOUT CARDIOVERSION N/A 04/04/2015   Procedure: TRANSESOPHAGEAL ECHOCARDIOGRAM (TEE);  Surgeon: Redell GORMAN Shallow, MD;  Location: [facility] ENDOSCOPY;  Service: Cardiovascular;  Laterality: N/A;   TOTAL KNEE ARTHROPLASTY Left 09/28/2022   Procedure: TOTAL KNEE ARTHROPLASTY;  Surgeon: Edna Toribio LABOR, MD;  Location: [facility] ORS;  Service: Orthopedics;  Laterality: Left;   TOTAL KNEE ARTHROPLASTY Right 01/11/2023   Procedure: TOTAL KNEE ARTHROPLASTY;  Surgeon: Edna Toribio LABOR, MD;  Location: [facility] ORS;  Service: Orthopedics;  Laterality: Right;   TRIGGER FINGER RELEASE Left 12/28/2019   Procedure: RELEASE TRIGGER FINGER/A-1 PULLEY THUMB, MIDDLE AND RING;  Surgeon: Murrell Kuba, MD;  Location: [facility];  Service: Orthopedics;  Laterality: Left;  FAB   UPPER GASTROINTESTINAL ENDOSCOPY     V Tach ablation  09/26/2015   Patient Active Problem List   Diagnosis Date Noted   CVA (cerebral vascular accident) (HCC) 01/15/2023   Primary osteoarthritis of right knee 01/11/2023   Localized osteoarthritis of right knee 01/11/2023   Primary osteoarthritis of left knee 09/28/2022   SBO (small bowel obstruction) (HCC) 05/29/2022   Chest pain 05/26/2022   Epigastric abdominal pain 05/26/2022   Coronary artery disease involving native coronary artery of native heart without angina  pectoris 05/28/2021   History of myocardial infarction 02/05/2021   Parkinson's disease (HCC)    Malignant neoplasm of prostate (HCC) 11/01/2017   Essential tremor 12/06/2015   PVC (premature ventricular contraction) 09/26/2015   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) 04/07/2015   Acute CVA (cerebrovascular accident) (HCC) 03/06/2015   Stroke (cerebrum) (HCC) 03/05/2015   Cerebral infarction (HCC) 03/05/2015   Weakness of right upper extremity    Tremor    Ejection fraction    Vertigo    Sleep apnea    Palpitations    Hyperlipidemia with target LDL less than 70    GERD (gastroesophageal reflux disease)    HTN (hypertension)    Chest pain    RBBB (right bundle branch block)    Ejection fraction    Carotid artery disease (HCC)     ONSET DATE: 10/15/2023 (referral)   REFERRING DIAG: R29.6 (ICD-10-CM) - Recurrent falls  THERAPY DIAG:  Other symptoms and signs involving the nervous system  Other lack of coordination  Unsteadiness on feet  Muscle weakness (generalized)  Other low back pain  Rationale for Evaluation and Treatment: Rehabilitation  SUBJECTIVE:                                                                                                                                                                                             SUBJECTIVE STATEMENT:  Pt presents w/straight cane, not wearing compression socks today as they are in the wash. Not feeling well today, having more low back and leg pain. Did not sleep well due to this. No falls. Had a head rush this AM but denies dizziness/lightheadedness currently.   Pt accompanied by: self  PERTINENT HISTORY: Rt TKA 01/11/23 with subsequent acute Lt CVA affecting Rt side, PD, Anxiety, HTN, HLD, multiple back surgeries/fusion neck and  lumbar spine,  NSTEMI, 01/2020, hx of multiple syncopal episodes, CVA in 2017  PAIN:  Are you having pain? Yes: NPRS scale: 6/10 Pain location: low back (L2)  and distal BLEs  Pain description: Achy  Aggravating factors: Too much movement  Relieving factors: Ablation, medications   PRECAUTIONS: Fall  RED FLAGS: None   WEIGHT BEARING RESTRICTIONS: No  FALLS: Has patient fallen in last 6 months? Yes. Number of falls 3  LIVING ENVIRONMENT: Lives with: lives with their spouse Lives in: House/apartment Stairs: Yes: External: 1 steps; none Has following equipment at home: Single point cane, Walker - 2 wheeled, Environmental Consultant - 4 wheeled, shower chair, Grab bars, and [employer] and transport chair  PLOF: Independent  PATIENT GOALS: Just to work on my balance   OBJECTIVE:  Note: Objective measures were completed at Evaluation unless otherwise noted.  DIAGNOSTIC FINDINGS: MRI of Lumbar spine from 05/15/23  IMPRESSION: 1. Operative changes of posterior fusion from L3 through S1. Interbody fusion at these levels. Prior laminotomies at L3-L4 and L5-S1. 2. Moderate foraminal stenoses on the right at L3-L4 and bilaterally at L4-L5. 3. Mild canal stenosis at L2-L3 and L3-L4.  CT of head from 09/10/23 CT CERVICAL SPINE FINDINGS   Alignment: Mild anterolisthesis of C2 on C3 and C4 on C5 is degenerate etiology and unchanged from the prior. No traumatic malalignment.   Skull base and vertebrae: No evidence of acute fracture. Vertebral body heights are maintained. Solid C5-C6 ACDF.   Soft tissues and spinal canal: No prevertebral fluid or swelling. No visible canal hematoma.   Disc levels: Similar multilevel degenerative change.   Upper chest: Visualized lung apices are clear.   IMPRESSION: No evidence of acute abnormality intracranially or in the cervical spine.  COGNITION: Overall cognitive status: Within functional limits for tasks assessed   SENSATION: Pt reports bilateral numbness/tingling in distal BLEs from feet to knees    POSTURE: rounded shoulders, forward head, increased thoracic kyphosis, posterior pelvic tilt, and weight  shift right  LOWER EXTREMITY ROM:     Active  Right Eval Left Eval  Hip flexion    Hip extension    Hip abduction    Hip adduction    Hip internal rotation    Hip external rotation    Knee flexion    Knee extension    Ankle dorsiflexion    Ankle plantarflexion    Ankle inversion    Ankle eversion     (Blank rows = not tested)  LOWER EXTREMITY MMT:    MMT Right Eval Left Eval  Hip flexion    Hip extension    Hip abduction    Hip adduction    Hip internal rotation    Hip external rotation    Knee flexion    Knee extension    Ankle dorsiflexion    Ankle plantarflexion    Ankle inversion    Ankle eversion    (Blank rows = not tested)  BED MOBILITY:  Not tested Pt reports difficulty getting out of the bed due to back pain and has increased difficulty getting out on the R side   TRANSFERS: Sit to stand: SBA  Assistive device utilized: None     Stand to sit: SBA  Assistive device utilized: None      RAMP:  Not tested  CURB:  Not tested  STAIRS: Not tested GAIT: Gait pattern: step through pattern, decreased step length- Right, decreased stance time- Left, decreased stride length, decreased ankle dorsiflexion- Right, knee flexed  in stance- Right, knee flexed in stance- Left, lateral hip instability, lateral lean- Right, decreased trunk rotation, trunk flexed, and poor foot clearance- Right Distance walked: Various clinic distances  Assistive device utilized: Single point cane and None Level of assistance: SBA Comments: Pt presented to session carrying SPC but did not use it during session. Noted continued truncal lean to R side but no overt LOB noted.   VITALS  Vitals:   12/08/23 1324 12/08/23 1337  BP: (!) 98/52 122/63  Pulse: 75 75    TREATMENT:    Therapeutic Activity/Self-care/home management: Assessed BP in LUE while seated (see above) and BP low today. Pt reports he has been drinking water  but overall feels off. Continued to closely monitor  during session.  SciFit multi-peaks level 9.0 for 8 minutes using BUE/BLEs for neural priming for reciprocal movement, dynamic cardiovascular warmup and increased amplitude of stepping. Reassessed BP in LUE while seated following SciFit (see above) and BP did improve, but performed remainder of session on mat as pt not wearing compression socks and is more fatigued today.   The following were performed for improved posterior chain strength, core stability and single leg stability:  Supine glute bridges, x10 reps. Min cues for TA activation and proper foot postioning. Progressed to 10 reps w/10# KB OH hold for added deep core activation. Min cues to maintain elbow extension throughout. No pain reported w/movement,   Supine modified dead bugs, 2x10 per side moving BLEs only. Min cues for proper TA activation throughout.  Sidelying leg lifts w/4# weight on top leg, x12 reps per side. Increased difficulty on RLE > LLE  Supine glute bridges w/feet elevated on red theraball, x10 reps. Progressed to glute bridge followed by HS curl w/feet on red therball, x8 reps. Pt required max concurrent cues to perform, as pt unable to lift hips and then extend knees and frequently attempted to perform both simultaneously.  No increase in pain reported at end of session     PATIENT EDUCATION: Education details: Continue HEP, continue wearing compression socks  Person educated: Patient Education method: Explanation, Demonstration, and Verbal cues Education comprehension: verbalized understanding, returned demonstration, verbal cues required, and needs further education  HOME EXERCISE PROGRAM: From previous POC:  Standing PWR moves Multi-directional stepping sheet  Pt also has a stretching program he has at home.    Access Code: [email or web address] URL: [email or web address] Date: 11/09/2023 Prepared by: Marlon Huda Petrey  Exercises - Heel Toe Raises with Counter Support  - 1 x daily - 5 x weekly - 1-2 sets  - 10 reps - Standing Single Leg Stance with Counter Support  - 1 x daily - 5 x weekly - 3 sets - 10-15 hold - Sidelying Open Book Thoracic Lumbar Rotation and Extension  - 1 x daily - 7 x weekly - 3 sets - 10 reps - Standing Near Stance in Corner with Eyes Closed  - 1 x daily - 7 x weekly - 3 sets - 30-45 seconds  hold  GOALS: Goals reviewed with patient? Yes  SHORT TERM GOALS: Target date: 11/24/2023   Pt will improve miniBEST to at least a 22/28 in order to demo decr fall risk.  Baseline:20/28 12/06/2023: 24/28, no AD Goal status: MET  2.  Pt will improve gait velocity to at least 2.7 ft/s w/LRAD for improved gait efficiency and independence   Baseline: 2.55 ft/s w/rollator; 2.7 ft/s w/no AD (11/21) Goal status: MET   3.  Pt will verbalize and demonstrate understanding of proper bed  mobility techniques for improved independence and efficiency w/bed mobility at home  Baseline: demonstrated on 11/30/23 Goal status: MET   LONG TERM GOALS: Target date: 12/22/2023   Pt will improve miniBEST to at least a 25/28 in order to demo decr fall risk. Baseline: 20/28 Goal status: INITIAL  2.  Pt will improve gait velocity to at least 3.0 ft/s w/LRAD for improved gait efficiency and independence  Baseline: 2.55 ft/s w/rollator  Goal status: INITIAL   ASSESSMENT:  CLINICAL IMPRESSION: Session limited as pt hypotensive, not wearing compression socks and reporting increased pain today. BP did increase following cardiovascular warmup, but focused session on functional core stability and posterior chain strength for improved postural control, pain modulation and stability in stance. Pt tolerated session well w/no increase in pain reported but was very challenged by multi-step exercises, requiring concurrent verbal cues to perform correctly. Continue POC.    OBJECTIVE IMPAIRMENTS: Abnormal gait, decreased activity tolerance, decreased balance, decreased coordination, decreased knowledge of  use of DME, decreased mobility, difficulty walking, decreased strength, increased fascial restrictions, increased muscle spasms, impaired sensation, improper body mechanics, postural dysfunction, and pain  ACTIVITY LIMITATIONS: carrying, lifting, bending, standing, squatting, stairs, transfers, bed mobility, reach over head, locomotion level, and caring for others  PARTICIPATION LIMITATIONS: meal prep, cleaning, laundry, interpersonal relationship, shopping, community activity, and yard work  PERSONAL FACTORS: Fitness, Past/current experiences, and 1-2 comorbidities: CVA and PD are also affecting patient's functional outcome.   REHAB POTENTIAL: Good  CLINICAL DECISION MAKING: Evolving/moderate complexity  EVALUATION COMPLEXITY: Moderate  PLAN:  PT FREQUENCY: 2x/week  PT DURATION: 8 weeks  PLANNED INTERVENTIONS: 97164- PT Re-evaluation, 97750- Physical Performance Testing, 97110-Therapeutic exercises, 97530- Therapeutic activity, [id number]- Neuromuscular re-education, 97535- Self Care, [id number]- Manual therapy, [id number]- Gait training, [phone]- Aquatic Therapy, [phone]- Electrical stimulation (manual), [phone] (1-2 muscles), [id number] (3+ muscles)- Dry Needling, Patient/Family education, Balance training, Stair training, Joint mobilization, Spinal mobilization, Vestibular training, and DME instructions  PLAN FOR NEXT SESSION: Work on lateral weight shifting, functional strength of LLE, postural control. SLS stability.  Balance on unlevel surfaces, dual tasking   Pt wants to use all scheduled PT visits per 11/21   He is most challenged when in tight spaces w/lots of people, such as restaurants, and is fearful of tripping over a child or someone abruptly stopping in front of him. Informed pt we can work on reactive and reacting balance strategies during remainder of PT.  Lynde Ludwig E Rhyan Radler, PT, DPT 12/08/2023, 2:04 PM

## 2023-12-14 ENCOUNTER — Ambulatory Visit: Attending: Internal Medicine | Admitting: Physical Therapy

## 2023-12-14 VITALS — BP 122/72 | HR 79

## 2023-12-14 DIAGNOSIS — R2681 Unsteadiness on feet: Secondary | ICD-10-CM | POA: Insufficient documentation

## 2023-12-14 DIAGNOSIS — R29818 Other symptoms and signs involving the nervous system: Secondary | ICD-10-CM | POA: Insufficient documentation

## 2023-12-14 DIAGNOSIS — R278 Other lack of coordination: Secondary | ICD-10-CM | POA: Diagnosis present

## 2023-12-14 DIAGNOSIS — G250 Essential tremor: Secondary | ICD-10-CM | POA: Insufficient documentation

## 2023-12-14 DIAGNOSIS — R4701 Aphasia: Secondary | ICD-10-CM | POA: Diagnosis present

## 2023-12-14 DIAGNOSIS — R471 Dysarthria and anarthria: Secondary | ICD-10-CM | POA: Insufficient documentation

## 2023-12-14 DIAGNOSIS — R293 Abnormal posture: Secondary | ICD-10-CM | POA: Insufficient documentation

## 2023-12-14 DIAGNOSIS — R41840 Attention and concentration deficit: Secondary | ICD-10-CM | POA: Diagnosis present

## 2023-12-14 DIAGNOSIS — R29898 Other symptoms and signs involving the musculoskeletal system: Secondary | ICD-10-CM | POA: Insufficient documentation

## 2023-12-14 DIAGNOSIS — R41842 Visuospatial deficit: Secondary | ICD-10-CM | POA: Insufficient documentation

## 2023-12-14 DIAGNOSIS — M5459 Other low back pain: Secondary | ICD-10-CM | POA: Diagnosis present

## 2023-12-14 DIAGNOSIS — R41844 Frontal lobe and executive function deficit: Secondary | ICD-10-CM | POA: Insufficient documentation

## 2023-12-14 DIAGNOSIS — M6281 Muscle weakness (generalized): Secondary | ICD-10-CM | POA: Diagnosis present

## 2023-12-14 NOTE — Therapy (Signed)
 OUTPATIENT PHYSICAL THERAPY NEURO TREATMENT   Patient Name: Joshua [name] MRN: [id number] DOB:1945-06-23, 78 y.o., male Today's Date: 12/14/2023   PCP: Loreli Elsie JONETTA Mickey., MD REFERRING PROVIDER: Loreli Elsie JONETTA Mickey., MD   END OF SESSION:  PT End of Session - 12/14/23 1103     Visit Number 13    Number of Visits 17   Plus eval   Date for Recertification  01/05/24    Authorization Type [employer]    PT Start Time 1102    PT Stop Time 1144    PT Time Calculation (min) 42 min    Equipment Utilized During Treatment Gait belt    Activity Tolerance Patient tolerated treatment well    Behavior During Therapy [facility] for tasks assessed/performed              Past Medical History:  Diagnosis Date   Allergic rhinitis    Anxiety    from chronic pain from surgery- on Cymbalta    Arthritis    Asthma    Barrett's esophagus 03/29/2014   Carotid artery disease    Carotid Doppler normal August, 2007   Coronary artery disease    Diverticulosis    Dyslipidemia    GERD (gastroesophageal reflux disease)    History of loop recorder    has since 04/04/15   HTN (hypertension)    takes Metoprolol  for PVC control   Hx of colonic polyps    adenomatous   IFG (impaired fasting glucose)    Myocardial infarction (HCC)    mild   Neuromuscular disorder (HCC)    parkinson's tremors in hands   Palpitations    Benign PVCs   Parkinson disease (HCC)    Pneumonia    Prostate cancer (HCC)    RBBB (right bundle branch block)    rate related   Shingles    Stroke [facility]) 2017   January 01, 2023,right hand weakness,   TIA (transient ischemic attack) 02/2015   Per pt, had 2 strokes   Vertigo    Past Surgical History:  Procedure Laterality Date   BACK SURGERY  2002,2009   x 6   CHOLECYSTECTOMY  11/30/2012   with IOC   COLONOSCOPY     ELECTROPHYSIOLOGIC STUDY N/A 09/26/2015   Procedure: V Tach Ablation (PVC);  Surgeon: Will Gladis Norton, MD;  Location: [facility] INVASIVE [facility];   Service: Cardiovascular;  Laterality: N/A;   EP IMPLANTABLE DEVICE N/A 04/04/2015   Procedure: Loop Recorder Insertion;  Surgeon: Will Gladis Norton, MD;  Location: [facility];  Service: Cardiovascular;  Laterality: N/A;   HERNIA REPAIR     laprascopic   KNEE SURGERY     DECEMBER 2017,LEFT KNEE SCOPED   LEFT HEART CATH AND CORONARY ANGIOGRAPHY N/A 02/05/2021   Procedure: LEFT HEART CATH AND CORONARY ANGIOGRAPHY;  Surgeon: Darron Deatrice LABOR, MD;  Location: [facility] CV [facility];  Service: Cardiovascular;  Laterality: N/A;   NECK SURGERY  2002   POLYPECTOMY     ROTATOR CUFF REPAIR Left    TEE WITHOUT CARDIOVERSION N/A 04/04/2015   Procedure: TRANSESOPHAGEAL ECHOCARDIOGRAM (TEE);  Surgeon: Redell GORMAN Shallow, MD;  Location: [facility] ENDOSCOPY;  Service: Cardiovascular;  Laterality: N/A;   TOTAL KNEE ARTHROPLASTY Left 09/28/2022   Procedure: TOTAL KNEE ARTHROPLASTY;  Surgeon: Edna Toribio LABOR, MD;  Location: [facility];  Service: Orthopedics;  Laterality: Left;   TOTAL KNEE ARTHROPLASTY Right 01/11/2023   Procedure: TOTAL KNEE ARTHROPLASTY;  Surgeon: Edna Toribio LABOR, MD;  Location: [facility];  Service: Orthopedics;  Laterality: Right;   TRIGGER FINGER RELEASE Left 12/28/2019   Procedure: RELEASE TRIGGER FINGER/A-1 PULLEY THUMB, MIDDLE AND RING;  Surgeon: Murrell Kuba, MD;  Location: [facility];  Service: Orthopedics;  Laterality: Left;  FAB   UPPER GASTROINTESTINAL ENDOSCOPY     V Tach ablation  09/26/2015   Patient Active Problem List   Diagnosis Date Noted   CVA (cerebral vascular accident) (HCC) 01/15/2023   Primary osteoarthritis of right knee 01/11/2023   Localized osteoarthritis of right knee 01/11/2023   Primary osteoarthritis of left knee 09/28/2022   SBO (small bowel obstruction) (HCC) 05/29/2022   Chest pain 05/26/2022   Epigastric abdominal pain 05/26/2022   Coronary artery disease involving native coronary artery of native heart without angina pectoris 05/28/2021    History of myocardial infarction 02/05/2021   Parkinson's disease (HCC)    Malignant neoplasm of prostate (HCC) 11/01/2017   Essential tremor 12/06/2015   PVC (premature ventricular contraction) 09/26/2015   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) 04/07/2015   Acute CVA (cerebrovascular accident) (HCC) 03/06/2015   Stroke (cerebrum) (HCC) 03/05/2015   Cerebral infarction (HCC) 03/05/2015   Weakness of right upper extremity    Tremor    Ejection fraction    Vertigo    Sleep apnea    Palpitations    Hyperlipidemia with target LDL less than 70    GERD (gastroesophageal reflux disease)    HTN (hypertension)    Chest pain    RBBB (right bundle branch block)    Ejection fraction    Carotid artery disease (HCC)     ONSET DATE: 10/15/2023 (referral)   REFERRING DIAG: R29.6 (ICD-10-CM) - Recurrent falls  THERAPY DIAG:  Muscle weakness (generalized)  Other low back pain  Unsteadiness on feet  Other lack of coordination  Abnormal posture  Rationale for Evaluation and Treatment: Rehabilitation  SUBJECTIVE:                                                                                                                                                                                             SUBJECTIVE STATEMENT:  Pt presents w/straight cane, wearing compression socks today. Denies falls or acute changes. Has been working on his HEP. Feels very stiff today.   Pt accompanied by: self  PERTINENT HISTORY: Rt TKA 01/11/23 with subsequent acute Lt CVA affecting Rt side, PD, Anxiety, HTN, HLD, multiple back surgeries/fusion neck and lumbar spine,  NSTEMI, 01/2020, hx of multiple syncopal episodes, CVA in 2017  PAIN:  Are you having pain? Yes: NPRS scale: 2/10 Pain location: low back (L2) and  distal BLEs  Pain description: Achy  Aggravating factors: Too much movement  Relieving factors: Ablation, medications   PRECAUTIONS: Fall  RED FLAGS: None   WEIGHT  BEARING RESTRICTIONS: No  FALLS: Has patient fallen in last 6 months? Yes. Number of falls 3  LIVING ENVIRONMENT: Lives with: lives with their spouse Lives in: House/apartment Stairs: Yes: External: 1 steps; none Has following equipment at home: Single point cane, Walker - 2 wheeled, Environmental Consultant - 4 wheeled, shower chair, Grab bars, and [employer] and transport chair  PLOF: Independent  PATIENT GOALS: Just to work on my balance   OBJECTIVE:  Note: Objective measures were completed at Evaluation unless otherwise noted.  DIAGNOSTIC FINDINGS: MRI of Lumbar spine from 05/15/23  IMPRESSION: 1. Operative changes of posterior fusion from L3 through S1. Interbody fusion at these levels. Prior laminotomies at L3-L4 and L5-S1. 2. Moderate foraminal stenoses on the right at L3-L4 and bilaterally at L4-L5. 3. Mild canal stenosis at L2-L3 and L3-L4.  CT of head from 09/10/23 CT CERVICAL SPINE FINDINGS   Alignment: Mild anterolisthesis of C2 on C3 and C4 on C5 is degenerate etiology and unchanged from the prior. No traumatic malalignment.   Skull base and vertebrae: No evidence of acute fracture. Vertebral body heights are maintained. Solid C5-C6 ACDF.   Soft tissues and spinal canal: No prevertebral fluid or swelling. No visible canal hematoma.   Disc levels: Similar multilevel degenerative change.   Upper chest: Visualized lung apices are clear.   IMPRESSION: No evidence of acute abnormality intracranially or in the cervical spine.  COGNITION: Overall cognitive status: Within functional limits for tasks assessed   SENSATION: Pt reports bilateral numbness/tingling in distal BLEs from feet to knees    POSTURE: rounded shoulders, forward head, increased thoracic kyphosis, posterior pelvic tilt, and weight shift right  LOWER EXTREMITY ROM:     Active  Right Eval Left Eval  Hip flexion    Hip extension    Hip abduction    Hip adduction    Hip internal rotation    Hip  external rotation    Knee flexion    Knee extension    Ankle dorsiflexion    Ankle plantarflexion    Ankle inversion    Ankle eversion     (Blank rows = not tested)  LOWER EXTREMITY MMT:    MMT Right Eval Left Eval  Hip flexion    Hip extension    Hip abduction    Hip adduction    Hip internal rotation    Hip external rotation    Knee flexion    Knee extension    Ankle dorsiflexion    Ankle plantarflexion    Ankle inversion    Ankle eversion    (Blank rows = not tested)  BED MOBILITY:  Not tested Pt reports difficulty getting out of the bed due to back pain and has increased difficulty getting out on the R side   TRANSFERS: Sit to stand: SBA  Assistive device utilized: None     Stand to sit: SBA  Assistive device utilized: None      RAMP:  Not tested  CURB:  Not tested  STAIRS: Not tested GAIT: Gait pattern: step through pattern, decreased step length- Right, decreased stance time- Left, decreased stride length, decreased ankle dorsiflexion- Right, knee flexed in stance- Right, knee flexed in stance- Left, lateral hip instability, lateral lean- Right, decreased trunk rotation, trunk flexed, and poor foot clearance- Right Distance walked: Various clinic distances  Assistive device utilized: Single point cane and None Level of assistance: Modified independence Comments: Pt presented to session carrying SPC but did not use it during session. Noted continued truncal lean to R side but no overt LOB noted.   VITALS  Vitals:   12/14/23 1106  BP: 122/72  Pulse: 79     TREATMENT:    Therapeutic Activity/Self-care/home management/Ther Ex  Assessed BP in LUE while seated (see above) and BP WNL today.  Surge carries x115' around gym for improved reactive balance strategies. Pt having difficulty standing upright today due to rigidity in upper thoracic spine and kyphosis. Pt requesting to work on mobilization of upper thoracic spine for remainder of session.  The  following were performed for improved thoracic mobility, trapezius strength/relaxation and postural control:  Tried quadruped thread the needles, but too difficult for pt to stabilize on RUE  Seated windmill stretch, x10 reps per side  Prone Y's and T's on blue theraball placed on mat to imitate incline bench, x20 reps each. Added to HEP (see bolded below)  Standing serratus activation at wall with foam roller, 2x2 minutes. Increased difficulty stabilizing foam roller w/RUE   Standing shoulder shrugs w/20# KB, x18 reps per side. Seated thoracic mobilization w/towel roll, x5 minutes. Added to HEP (see bolded below) and informed pt he may perform this seated or supine. Pt enjoyed seated position and stated it assisted him in sitting upright.     PATIENT EDUCATION: Education details: updates to HEP, continue wearing compression socks  Person educated: Patient Education method: Explanation, Demonstration, and Verbal cues Education comprehension: verbalized understanding, returned demonstration, verbal cues required, and needs further education  HOME EXERCISE PROGRAM: From previous POC:  Standing PWR moves Multi-directional stepping sheet  Pt also has a stretching program he has at home.    Access Code: [email or web address] URL: [email or web address] Date: 11/09/2023 Prepared by: Marlon Aniesa Boback  Exercises - Heel Toe Raises with Counter Support  - 1 x daily - 5 x weekly - 1-2 sets - 10 reps - Standing Single Leg Stance with Counter Support  - 1 x daily - 5 x weekly - 3 sets - 10-15 hold - Sidelying Open Book Thoracic Lumbar Rotation and Extension  - 1 x daily - 7 x weekly - 3 sets - 10 reps - Standing Near Stance in Corner with Eyes Closed  - 1 x daily - 7 x weekly - 3 sets - 30-45 seconds  hold - Prone Middle Trapezius with Legs Straight on Swiss Ball  - 1 x daily - 7 x weekly - 3 sets - 10 reps - Prone Lower Trapezius with Legs Straight on Swiss Ball  - 1 x daily - 7 x weekly - 3 sets -  10 reps - Supine Thoracic Mobilization Towel Roll Vertical with Arm Stretch  - 1 x daily - 7 x weekly  GOALS: Goals reviewed with patient? Yes  SHORT TERM GOALS: Target date: 11/24/2023   Pt will improve miniBEST to at least a 22/28 in order to demo decr fall risk.  Baseline:20/28 12/06/2023: 24/28, no AD Goal status: MET  2.  Pt will improve gait velocity to at least 2.7 ft/s w/LRAD for improved gait efficiency and independence   Baseline: 2.55 ft/s w/rollator; 2.7 ft/s w/no AD (11/21) Goal status: MET   3.  Pt will verbalize and demonstrate understanding of proper bed mobility techniques for improved independence and efficiency w/bed mobility at home  Baseline: demonstrated on 11/30/23 Goal status: MET   LONG TERM  GOALS: Target date: 12/22/2023   Pt will improve miniBEST to at least a 25/28 in order to demo decr fall risk. Baseline: 20/28 Goal status: INITIAL  2.  Pt will improve gait velocity to at least 3.0 ft/s w/LRAD for improved gait efficiency and independence  Baseline: 2.55 ft/s w/rollator  Goal status: INITIAL   ASSESSMENT:  CLINICAL IMPRESSION: Emphasis of skilled PT session on postural control, periscapular strength thoracic mobility. Pt reported being very stiff today due to weather and had increased kyphotic posture in stance, resulting in worsened balance. Worked on mobility and periscapular activation during session and pt reported increased stability and reduced rigidity by end of session. Pt also able to stand erect by end of session w/o no use of AD. Added theraball activities to HEP as pt has theraball at home and enjoys using it. Continue POC.    OBJECTIVE IMPAIRMENTS: Abnormal gait, decreased activity tolerance, decreased balance, decreased coordination, decreased knowledge of use of DME, decreased mobility, difficulty walking, decreased strength, increased fascial restrictions, increased muscle spasms, impaired sensation, improper body mechanics,  postural dysfunction, and pain  ACTIVITY LIMITATIONS: carrying, lifting, bending, standing, squatting, stairs, transfers, bed mobility, reach over head, locomotion level, and caring for others  PARTICIPATION LIMITATIONS: meal prep, cleaning, laundry, interpersonal relationship, shopping, community activity, and yard work  PERSONAL FACTORS: Fitness, Past/current experiences, and 1-2 comorbidities: CVA and PD are also affecting patient's functional outcome.   REHAB POTENTIAL: Good  CLINICAL DECISION MAKING: Evolving/moderate complexity  EVALUATION COMPLEXITY: Moderate  PLAN:  PT FREQUENCY: 2x/week  PT DURATION: 8 weeks  PLANNED INTERVENTIONS: [id number]- PT Re-evaluation, 97750- Physical Performance Testing, 97110-Therapeutic exercises, 97530- Therapeutic activity, [id number]- Neuromuscular re-education, 97535- Self Care, [id number]- Manual therapy, [id number]- Gait training, [phone]- Aquatic Therapy, [phone]- Electrical stimulation (manual), [phone] (1-2 muscles), [id number] (3+ muscles)- Dry Needling, Patient/Family education, Balance training, Stair training, Joint mobilization, Spinal mobilization, Vestibular training, and DME instructions  PLAN FOR NEXT SESSION: Work on lateral weight shifting, functional strength of LLE, postural control. SLS stability.  Balance on unlevel surfaces, dual tasking. Work on periscapular strength, core stability   Pt wants to use all scheduled PT visits per 11/21   He is most challenged when in tight spaces w/lots of people, such as restaurants, and is fearful of tripping over a child or someone abruptly stopping in front of him. Informed pt we can work on reactive and reacting balance strategies during remainder of PT.  Anahli Arvanitis E Ellouise Mcwhirter, PT, DPT 12/14/2023, 11:45 AM

## 2023-12-17 ENCOUNTER — Ambulatory Visit: Admitting: Physical Therapy

## 2023-12-17 VITALS — BP 114/62 | HR 76

## 2023-12-17 DIAGNOSIS — M6281 Muscle weakness (generalized): Secondary | ICD-10-CM

## 2023-12-17 DIAGNOSIS — R293 Abnormal posture: Secondary | ICD-10-CM

## 2023-12-17 DIAGNOSIS — M5459 Other low back pain: Secondary | ICD-10-CM

## 2023-12-17 DIAGNOSIS — R2681 Unsteadiness on feet: Secondary | ICD-10-CM

## 2023-12-17 NOTE — Therapy (Addendum)
 OUTPATIENT PHYSICAL THERAPY NEURO TREATMENT   Patient Name: Joshua [name] MRN: [id number] DOB:February 05, 1945, 78 y.o., male Today's Date: 12/17/2023   PCP: Loreli Elsie JONETTA Mickey., MD REFERRING PROVIDER: Loreli Elsie JONETTA Mickey., MD   END OF SESSION:  PT End of Session - 12/17/23 1110     Visit Number 14    Number of Visits 17   Plus eval   Date for Recertification  01/05/24    Authorization Type [employer]    PT Start Time 1106    PT Stop Time 1151    PT Time Calculation (min) 45 min    Equipment Utilized During Treatment Gait belt    Activity Tolerance Patient tolerated treatment well    Behavior During Therapy [facility] for tasks assessed/performed               Past Medical History:  Diagnosis Date   Allergic rhinitis    Anxiety    from chronic pain from surgery- on Cymbalta    Arthritis    Asthma    Barrett's esophagus 03/29/2014   Carotid artery disease    Carotid Doppler normal August, 2007   Coronary artery disease    Diverticulosis    Dyslipidemia    GERD (gastroesophageal reflux disease)    History of loop recorder    has since 04/04/15   HTN (hypertension)    takes Metoprolol  for PVC control   Hx of colonic polyps    adenomatous   IFG (impaired fasting glucose)    Myocardial infarction (HCC)    mild   Neuromuscular disorder (HCC)    parkinson's tremors in hands   Palpitations    Benign PVCs   Parkinson disease (HCC)    Pneumonia    Prostate cancer (HCC)    RBBB (right bundle branch block)    rate related   Shingles    Stroke [facility]) 2017   January 01, 2023,right hand weakness,   TIA (transient ischemic attack) 02/2015   Per pt, had 2 strokes   Vertigo    Past Surgical History:  Procedure Laterality Date   BACK SURGERY  2002,2009   x 6   CHOLECYSTECTOMY  11/30/2012   with IOC   COLONOSCOPY     ELECTROPHYSIOLOGIC STUDY N/A 09/26/2015   Procedure: V Tach Ablation (PVC);  Surgeon: Will Gladis Norton, MD;  Location: [facility];   Service: Cardiovascular;  Laterality: N/A;   EP IMPLANTABLE DEVICE N/A 04/04/2015   Procedure: Loop Recorder Insertion;  Surgeon: Will Gladis Norton, MD;  Location: [facility];  Service: Cardiovascular;  Laterality: N/A;   HERNIA REPAIR     laprascopic   KNEE SURGERY     DECEMBER 2017,LEFT KNEE SCOPED   LEFT HEART CATH AND CORONARY ANGIOGRAPHY N/A 02/05/2021   Procedure: LEFT HEART CATH AND CORONARY ANGIOGRAPHY;  Surgeon: Darron Deatrice LABOR, MD;  Location: [facility];  Service: Cardiovascular;  Laterality: N/A;   NECK SURGERY  2002   POLYPECTOMY     ROTATOR CUFF REPAIR Left    TEE WITHOUT CARDIOVERSION N/A 04/04/2015   Procedure: TRANSESOPHAGEAL ECHOCARDIOGRAM (TEE);  Surgeon: Redell GORMAN Shallow, MD;  Location: [facility] ENDOSCOPY;  Service: Cardiovascular;  Laterality: N/A;   TOTAL KNEE ARTHROPLASTY Left 09/28/2022   Procedure: TOTAL KNEE ARTHROPLASTY;  Surgeon: Edna Toribio LABOR, MD;  Location: [facility];  Service: Orthopedics;  Laterality: Left;   TOTAL KNEE ARTHROPLASTY Right 01/11/2023   Procedure: TOTAL KNEE ARTHROPLASTY;  Surgeon: Edna Toribio LABOR, MD;  Location:  [facility] OR[facility];  Service: Orthopedics;  Laterality: Right;   TRIGGER FINGER RELEASE Left 12/28/2019   Procedure: RELEASE TRIGGER FINGER/A-1 PULLEY THUMB, MIDDLE AND RING;  Surgeon: Murrell Kuba, MD;  Location: [facility];  Service: Orthopedics;  Laterality: Left;  FAB   UPPER GASTROINTESTINAL ENDOSCOPY     V Tach ablation  09/26/2015   Patient Active Problem List   Diagnosis Date Noted   CVA (cerebral vascular accident) (HCC) 01/15/2023   Primary osteoarthritis of right knee 01/11/2023   Localized osteoarthritis of right knee 01/11/2023   Primary osteoarthritis of left knee 09/28/2022   SBO (small bowel obstruction) (HCC) 05/29/2022   Chest pain 05/26/2022   Epigastric abdominal pain 05/26/2022   Coronary artery disease involving native coronary artery of native heart without angina pectoris 05/28/2021    History of myocardial infarction 02/05/2021   Parkinson's disease (HCC)    Malignant neoplasm of prostate (HCC) 11/01/2017   Essential tremor 12/06/2015   PVC (premature ventricular contraction) 09/26/2015   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) 04/07/2015   Acute CVA (cerebrovascular accident) (HCC) 03/06/2015   Stroke (cerebrum) (HCC) 03/05/2015   Cerebral infarction (HCC) 03/05/2015   Weakness of right upper extremity    Tremor    Ejection fraction    Vertigo    Sleep apnea    Palpitations    Hyperlipidemia with target LDL less than 70    GERD (gastroesophageal reflux disease)    HTN (hypertension)    Chest pain    RBBB (right bundle branch block)    Ejection fraction    Carotid artery disease (HCC)     ONSET DATE: 10/15/2023 (referral)   REFERRING DIAG: R29.6 (ICD-10-CM) - Recurrent falls  THERAPY DIAG:  Muscle weakness (generalized)  Other low back pain  Unsteadiness on feet  Abnormal posture  Rationale for Evaluation and Treatment: Rehabilitation  SUBJECTIVE:                                                                                                                                                                                             SUBJECTIVE STATEMENT:  Patient presents to clinic alone and ambulating with SPC but not touching the ground. Not wearing compression socks today. He states he continues to do a lot of home exercise.   Pt accompanied by: self  PERTINENT HISTORY: Rt TKA 01/11/23 with subsequent acute Lt CVA affecting Rt side, PD, Anxiety, HTN, HLD, multiple back surgeries/fusion neck and lumbar spine,  NSTEMI, 01/2020, hx of multiple syncopal episodes, CVA in 2017  PAIN:  Are you having pain? Yes: NPRS scale: 2/10 Pain location: low  back (L2) and distal BLEs  Pain description: Achy  Aggravating factors: Too much movement  Relieving factors: Ablation, medications   PRECAUTIONS: Fall  RED FLAGS: None   WEIGHT  BEARING RESTRICTIONS: No  FALLS: Has patient fallen in last 6 months? Yes. Number of falls 3  LIVING ENVIRONMENT: Lives with: lives with their spouse Lives in: House/apartment Stairs: Yes: External: 1 steps; none Has following equipment at home: Single point cane, Walker - 2 wheeled, Environmental Consultant - 4 wheeled, shower chair, Grab bars, and [employer] and transport chair  PLOF: Independent  PATIENT GOALS: Just to work on my balance   OBJECTIVE:  Note: Objective measures were completed at Evaluation unless otherwise noted.  DIAGNOSTIC FINDINGS: MRI of Lumbar spine from 05/15/23  IMPRESSION: 1. Operative changes of posterior fusion from L3 through S1. Interbody fusion at these levels. Prior laminotomies at L3-L4 and L5-S1. 2. Moderate foraminal stenoses on the right at L3-L4 and bilaterally at L4-L5. 3. Mild canal stenosis at L2-L3 and L3-L4.  CT of head from 09/10/23 CT CERVICAL SPINE FINDINGS   Alignment: Mild anterolisthesis of C2 on C3 and C4 on C5 is degenerate etiology and unchanged from the prior. No traumatic malalignment.   Skull base and vertebrae: No evidence of acute fracture. Vertebral body heights are maintained. Solid C5-C6 ACDF.   Soft tissues and spinal canal: No prevertebral fluid or swelling. No visible canal hematoma.   Disc levels: Similar multilevel degenerative change.   Upper chest: Visualized lung apices are clear.   IMPRESSION: No evidence of acute abnormality intracranially or in the cervical spine.  COGNITION: Overall cognitive status: Within functional limits for tasks assessed   SENSATION: Pt reports bilateral numbness/tingling in distal BLEs from feet to knees    POSTURE: rounded shoulders, forward head, increased thoracic kyphosis, posterior pelvic tilt, and weight shift right  LOWER EXTREMITY ROM:     Active  Right Eval Left Eval  Hip flexion    Hip extension    Hip abduction    Hip adduction    Hip internal rotation    Hip  external rotation    Knee flexion    Knee extension    Ankle dorsiflexion    Ankle plantarflexion    Ankle inversion    Ankle eversion     (Blank rows = not tested)  LOWER EXTREMITY MMT:    MMT Right Eval Left Eval  Hip flexion    Hip extension    Hip abduction    Hip adduction    Hip internal rotation    Hip external rotation    Knee flexion    Knee extension    Ankle dorsiflexion    Ankle plantarflexion    Ankle inversion    Ankle eversion    (Blank rows = not tested)  BED MOBILITY:  Not tested Pt reports difficulty getting out of the bed due to back pain and has increased difficulty getting out on the R side   TRANSFERS: Sit to stand: SBA  Assistive device utilized: None     Stand to sit: SBA  Assistive device utilized: None      RAMP:  Not tested  CURB:  Not tested  STAIRS: Not tested GAIT: Gait pattern: step through pattern, decreased step length- Right, decreased stance time- Left, decreased stride length, decreased ankle dorsiflexion- Right, knee flexed in stance- Right, knee flexed in stance- Left, lateral hip instability, lateral lean- Right, decreased trunk rotation, trunk flexed, and poor foot clearance- Right Distance walked: Various  clinic distances  Assistive device utilized: Single point cane and None Level of assistance: Modified independence Comments: Pt presented to session carrying SPC but did not use it during session. Noted continued truncal lean to R side but no overt LOB noted.   TREATMENT:   Self Care: Vitals:   12/17/23 1114 12/17/23 1115  BP: 107/65 114/62  Pulse: 69 76   - Education on need to wear compression socks, continue checking BP at home, continue HEP, need to continue to use cane and make contact with the floor  NMR: Thoracic extension seated over half foam roller, 2 sets x 10 reps Sit to stand with PWRMove and arms big x 10 reps Sit to stand on foam with 8 pound overhead ball press, cueing for reaching to the sky,  well tolerated, 2 sets x 10 reps In parallel bars on foam: Rotational throw behind patient with light purple ball x 10 reps each side, well tolerated, no pain or discomfort reported On air ex: Steps with sign - directions written with different colors, colors called out and directions performed Improved carry over with cognitive dual tasking, min assist periodically for LOB, less posterior leaning  *CGA needed throughout due to some loss of balance but improved with less assistance and less posterior leaning since last visit  PATIENT EDUCATION: Education details: updates to HEP, continue wearing compression socks  Person educated: Patient Education method: Explanation, Demonstration, and Verbal cues Education comprehension: verbalized understanding, returned demonstration, verbal cues required, and needs further education  HOME EXERCISE PROGRAM: From previous POC:  Standing PWR moves Multi-directional stepping sheet  Pt also has a stretching program he has at home.    Access Code: [email or web address] URL: [email or web address] Date: 11/09/2023 Prepared by: Marlon Plaster  Exercises - Heel Toe Raises with Counter Support  - 1 x daily - 5 x weekly - 1-2 sets - 10 reps - Standing Single Leg Stance with Counter Support  - 1 x daily - 5 x weekly - 3 sets - 10-15 hold - Sidelying Open Book Thoracic Lumbar Rotation and Extension  - 1 x daily - 7 x weekly - 3 sets - 10 reps - Standing Near Stance in Corner with Eyes Closed  - 1 x daily - 7 x weekly - 3 sets - 30-45 seconds  hold - Prone Middle Trapezius with Legs Straight on Swiss Ball  - 1 x daily - 7 x weekly - 3 sets - 10 reps - Prone Lower Trapezius with Legs Straight on Swiss Ball  - 1 x daily - 7 x weekly - 3 sets - 10 reps - Supine Thoracic Mobilization Towel Roll Vertical with Arm Stretch  - 1 x daily - 7 x weekly  GOALS: Goals reviewed with patient? Yes  SHORT TERM GOALS: Target date: 11/24/2023   Pt will improve miniBEST  to at least a 22/28 in order to demo decr fall risk.  Baseline:20/28 12/06/2023: 24/28, no AD Goal status: MET  2.  Pt will improve gait velocity to at least 2.7 ft/s w/LRAD for improved gait efficiency and independence   Baseline: 2.55 ft/s w/rollator; 2.7 ft/s w/no AD (11/21) Goal status: MET   3.  Pt will verbalize and demonstrate understanding of proper bed mobility techniques for improved independence and efficiency w/bed mobility at home  Baseline: demonstrated on 11/30/23 Goal status: MET   LONG TERM GOALS: Target date: 12/22/2023   Pt will improve miniBEST to at least a 25/28 in order to demo decr fall risk. Baseline: 20/28  Goal status: INITIAL  2.  Pt will improve gait velocity to at least 3.0 ft/s w/LRAD for improved gait efficiency and independence  Baseline: 2.55 ft/s w/rollator  Goal status: INITIAL   ASSESSMENT:  CLINICAL IMPRESSION: Emphasis of skilled PT session on thoracic mobility, PWR Moves and stepping balance and coordination. BP was checked in sitting and standing today and both WNL for treatment. Sit to stands with overhead pressing was well tolerated and challenging due to decreased overhead mobility which did improve with repetitions. Multidirectional stepping continued to show improvement with better ability for pt to follow directions and recall next steps without reminders. No use of AD during clinic ambulation. Patient continues to benefit from skilled PT in order to improve above mentioned deficits and to reduce risk of falls. Continue per POC.   OBJECTIVE IMPAIRMENTS: Abnormal gait, decreased activity tolerance, decreased balance, decreased coordination, decreased knowledge of use of DME, decreased mobility, difficulty walking, decreased strength, increased fascial restrictions, increased muscle spasms, impaired sensation, improper body mechanics, postural dysfunction, and pain  ACTIVITY LIMITATIONS: carrying, lifting, bending, standing, squatting,  stairs, transfers, bed mobility, reach over head, locomotion level, and caring for others  PARTICIPATION LIMITATIONS: meal prep, cleaning, laundry, interpersonal relationship, shopping, community activity, and yard work  PERSONAL FACTORS: Fitness, Past/current experiences, and 1-2 comorbidities: CVA and PD are also affecting patient's functional outcome.   REHAB POTENTIAL: Good  CLINICAL DECISION MAKING: Evolving/moderate complexity  EVALUATION COMPLEXITY: Moderate  PLAN:  PT FREQUENCY: 2x/week  PT DURATION: 8 weeks  PLANNED INTERVENTIONS: [id number]- PT Re-evaluation, 97750- Physical Performance Testing, 97110-Therapeutic exercises, 97530- Therapeutic activity, [id number]- Neuromuscular re-education, 97535- Self Care, [id number]- Manual therapy, [id number]- Gait training, [phone]- Aquatic Therapy, [phone]- Electrical stimulation (manual), [phone] (1-2 muscles), [id number] (3+ muscles)- Dry Needling, Patient/Family education, Balance training, Stair training, Joint mobilization, Spinal mobilization, Vestibular training, and DME instructions  PLAN FOR NEXT SESSION: Work on lateral weight shifting, functional strength of LLE, postural control. SLS stability.  Balance on unlevel surfaces, dual tasking. Work on periscapular strength, core stability   Pt wants to use all scheduled PT visits per 11/21   He is most challenged when in tight spaces w/lots of people, such as restaurants, and is fearful of tripping over a child or someone abruptly stopping in front of him. Informed pt we can work on reactive and reacting balance strategies during remainder of PT.  Check goals  Emmalene Sherry, Student-PT, DPT 12/17/2023, 1:52 PM

## 2023-12-21 ENCOUNTER — Ambulatory Visit: Admitting: Physical Therapy

## 2023-12-21 ENCOUNTER — Encounter: Payer: Self-pay | Admitting: Physical Therapy

## 2023-12-21 VITALS — BP 128/78 | HR 82

## 2023-12-21 DIAGNOSIS — R293 Abnormal posture: Secondary | ICD-10-CM

## 2023-12-21 DIAGNOSIS — R2681 Unsteadiness on feet: Secondary | ICD-10-CM

## 2023-12-21 DIAGNOSIS — M6281 Muscle weakness (generalized): Secondary | ICD-10-CM | POA: Diagnosis not present
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
## 2023-12-24 ENCOUNTER — Ambulatory Visit: Admitting: Physical Therapy

## 2023-12-24 ENCOUNTER — Ambulatory Visit

## 2023-12-24 VITALS — BP 124/72 | HR 83

## 2023-12-24 DIAGNOSIS — R2681 Unsteadiness on feet: Secondary | ICD-10-CM

## 2023-12-24 DIAGNOSIS — R278 Other lack of coordination: Secondary | ICD-10-CM

## 2023-12-24 DIAGNOSIS — G250 Essential tremor: Secondary | ICD-10-CM

## 2023-12-24 DIAGNOSIS — M6281 Muscle weakness (generalized): Secondary | ICD-10-CM

## 2023-12-24 DIAGNOSIS — R29898 Other symptoms and signs involving the musculoskeletal system: Secondary | ICD-10-CM

## 2023-12-24 DIAGNOSIS — R293 Abnormal posture: Secondary | ICD-10-CM

## 2023-12-24 DIAGNOSIS — R471 Dysarthria and anarthria: Secondary | ICD-10-CM

## 2023-12-24 DIAGNOSIS — M5459 Other low back pain: Secondary | ICD-10-CM

## 2023-12-24 NOTE — Therapy (Signed)
 OUTPATIENT PHYSICAL THERAPY NEURO TREATMENT - DISCHARGE SUMMARY    Patient Name: Joshua [name] MRN: [id number] DOB:Sep 27, 1945, 78 y.o., male Today's Date: 12/24/2023   PCP: Loreli Elsie JONETTA Mickey., MD REFERRING PROVIDER: Loreli Elsie JONETTA Mickey., MD  PHYSICAL THERAPY DISCHARGE SUMMARY  Visits from Start of Care: 16  Current functional level related to goals / functional outcomes: Pt is mod I w/all ADLS both with and without use of SPC   Remaining deficits: Bradykinesia, truncal lean to R, increased fall risk    Education / Equipment: HEP, 61mo PD screen    Patient agrees to discharge. Patient goals were partially met. Patient is being discharged due to being pleased with the current functional level.    END OF SESSION:  PT End of Session - 12/24/23 1106     Visit Number 16    Number of Visits 17   Plus eval   Date for Recertification  01/05/24    Authorization Type [employer]    PT Start Time 1104   handoff w/OT   PT Stop Time 1133   DC   PT Time Calculation (min) 29 min    Equipment Utilized During Treatment Gait belt    Activity Tolerance Patient tolerated treatment well    Behavior During Therapy WFL for tasks assessed/performed                Past Medical History:  Diagnosis Date   Allergic rhinitis    Anxiety    from chronic pain from surgery- on Cymbalta    Arthritis    Asthma    Barrett's esophagus 03/29/2014   Carotid artery disease    Carotid Doppler normal August, 2007   Coronary artery disease    Diverticulosis    Dyslipidemia    GERD (gastroesophageal reflux disease)    History of loop recorder    has since 04/04/15   HTN (hypertension)    takes Metoprolol  for PVC control   Hx of colonic polyps    adenomatous   IFG (impaired fasting glucose)    Myocardial infarction (HCC)    mild   Neuromuscular disorder (HCC)    parkinson's tremors in hands   Palpitations    Benign PVCs   Parkinson disease (HCC)    Pneumonia    Prostate  cancer (HCC)    RBBB (right bundle branch block)    rate related   Shingles    Stroke [facility]) 2017   January 01, 2023,right hand weakness,   TIA (transient ischemic attack) 02/2015   Per pt, had 2 strokes   Vertigo    Past Surgical History:  Procedure Laterality Date   BACK SURGERY  2002,2009   x 6   CHOLECYSTECTOMY  11/30/2012   with IOC   COLONOSCOPY     ELECTROPHYSIOLOGIC STUDY N/A 09/26/2015   Procedure: V Tach Ablation (PVC);  Surgeon: Will Gladis Norton, MD;  Location: [facility];  Service: Cardiovascular;  Laterality: N/A;   EP IMPLANTABLE DEVICE N/A 04/04/2015   Procedure: Loop Recorder Insertion;  Surgeon: Will Gladis Norton, MD;  Location: [facility];  Service: Cardiovascular;  Laterality: N/A;   HERNIA REPAIR     laprascopic   KNEE SURGERY     DECEMBER 2017,LEFT KNEE SCOPED   LEFT HEART CATH AND CORONARY ANGIOGRAPHY N/A 02/05/2021   Procedure: LEFT HEART CATH AND CORONARY ANGIOGRAPHY;  Surgeon: Darron Deatrice LABOR, MD;  Location: [facility];  Service: Cardiovascular;  Laterality: N/A;   NECK SURGERY  2002   POLYPECTOMY     ROTATOR CUFF REPAIR Left    TEE WITHOUT CARDIOVERSION N/A 04/04/2015   Procedure: TRANSESOPHAGEAL ECHOCARDIOGRAM (TEE);  Surgeon: Redell GORMAN Shallow, MD;  Location[facility] ENDOS[facility];  Service: Cardiovascular;  Laterality: N/A;   TOTAL KNEE ARTHROPLASTY Left 09/28/2022   Procedure: TOTAL KNEE ARTHROPLASTY;  Surgeon: Edna Toribio LABOR, MD;  Location: [facility];  Service: Orthopedics;  Laterality: Left;   TOTAL KNEE ARTHROPLASTY Right 01/11/2023   Procedure: TOTAL KNEE ARTHROPLASTY;  Surgeon: Edna Toribio LABOR, MD;  Location: [facility];  Service: Orthopedics;  Laterality: Right;   TRIGGER FINGER RELEASE Left 12/28/2019   Procedure: RELEASE TRIGGER FINGER/A-1 PULLEY THUMB, MIDDLE AND RING;  Surgeon: Murrell Kuba, MD;  Location: [facility];  Service: Orthopedics;  Laterality: Left;  FAB   UPPER GASTROINTESTINAL ENDOSCOPY      V Tach ablation  09/26/2015   Patient Active Problem List   Diagnosis Date Noted   CVA (cerebral vascular accident) (HCC) 01/15/2023   Primary osteoarthritis of right knee 01/11/2023   Localized osteoarthritis of right knee 01/11/2023   Primary osteoarthritis of left knee 09/28/2022   SBO (small bowel obstruction) (HCC) 05/29/2022   Chest pain 05/26/2022   Epigastric abdominal pain 05/26/2022   Coronary artery disease involving native coronary artery of native heart without angina pectoris 05/28/2021   History of myocardial infarction 02/05/2021   Parkinson's disease (HCC)    Malignant neoplasm of prostate (HCC) 11/01/2017   Essential tremor 12/06/2015   PVC (premature ventricular contraction) 09/26/2015   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) 04/07/2015   Acute CVA (cerebrovascular accident) (HCC) 03/06/2015   Stroke (cerebrum) (HCC) 03/05/2015   Cerebral infarction (HCC) 03/05/2015   Weakness of right upper extremity    Tremor    Ejection fraction    Vertigo    Sleep apnea    Palpitations    Hyperlipidemia with target LDL less than 70    GERD (gastroesophageal reflux disease)    HTN (hypertension)    Chest pain    RBBB (right bundle branch block)    Ejection fraction    Carotid artery disease (HCC)     ONSET DATE: 10/15/2023 (referral)   REFERRING DIAG: R29.6 (ICD-10-CM) - Recurrent falls  THERAPY DIAG:  Muscle weakness (generalized)  Unsteadiness on feet  Abnormal posture  Other low back pain  Other lack of coordination  Rationale for Evaluation and Treatment: Rehabilitation  SUBJECTIVE:  SUBJECTIVE STATEMENT:   Octaviano   Pt reports doing well. HEP is still challenging. Did not wear his compression socks today. No falls  Pt accompanied by:  self  PERTINENT HISTORY: Rt TKA 01/11/23 with subsequent acute Lt CVA affecting Rt side, PD, Anxiety, HTN, HLD, multiple back surgeries/fusion neck and lumbar spine,  NSTEMI, 01/2020, hx of multiple syncopal episodes, CVA in 2017  PAIN:  Are you having pain? Yes: NPRS scale: 7/10 Pain location: low back (L2) and distal BLEs  Pain description: Achy  Aggravating factors: Too much movement  Relieving factors: Ablation, medications   Reports R hand is about a 7/10, has pins and needles in his hands  PRECAUTIONS: Fall  RED FLAGS: None   WEIGHT BEARING RESTRICTIONS: No  FALLS: Has patient fallen in last 6 months? Yes. Number of falls 3  LIVING ENVIRONMENT: Lives with: lives with their spouse Lives in: House/apartment Stairs: Yes: External: 1 steps; none Has following equipment at home: Single point cane, Walker - 2 wheeled, Environmental Consultant - 4 wheeled, shower chair, Grab bars, and [employer] and transport chair  PLOF: Independent  PATIENT GOALS: Just to work on my balance   OBJECTIVE:  Note: Objective measures were completed at Evaluation unless otherwise noted.  DIAGNOSTIC FINDINGS: MRI of Lumbar spine from 05/15/23  IMPRESSION: 1. Operative changes of posterior fusion from L3 through S1. Interbody fusion at these levels. Prior laminotomies at L3-L4 and L5-S1. 2. Moderate foraminal stenoses on the right at L3-L4 and bilaterally at L4-L5. 3. Mild canal stenosis at L2-L3 and L3-L4.  CT of head from 09/10/23 CT CERVICAL SPINE FINDINGS   Alignment: Mild anterolisthesis of C2 on C3 and C4 on C5 is degenerate etiology and unchanged from the prior. No traumatic malalignment.   Skull base and vertebrae: No evidence of acute fracture. Vertebral body heights are maintained. Solid C5-C6 ACDF.   Soft tissues and spinal canal: No prevertebral fluid or swelling. No visible canal hematoma.   Disc levels: Similar multilevel degenerative change.   Upper chest: Visualized lung apices are  clear.   IMPRESSION: No evidence of acute abnormality intracranially or in the cervical spine.  COGNITION: Overall cognitive status: Within functional limits for tasks assessed   SENSATION: Pt reports bilateral numbness/tingling in distal BLEs from feet to knees    POSTURE: rounded shoulders, forward head, increased thoracic kyphosis, posterior pelvic tilt, and weight shift right  LOWER EXTREMITY ROM:     Active  Right Eval Left Eval  Hip flexion    Hip extension    Hip abduction    Hip adduction    Hip internal rotation    Hip external rotation    Knee flexion    Knee extension    Ankle dorsiflexion    Ankle plantarflexion    Ankle inversion    Ankle eversion     (Blank rows = not tested)  LOWER EXTREMITY MMT:    MMT Right Eval Left Eval  Hip flexion    Hip extension    Hip abduction    Hip adduction    Hip internal rotation    Hip external rotation    Knee flexion    Knee extension    Ankle dorsiflexion    Ankle plantarflexion    Ankle inversion    Ankle eversion    (Blank rows = not tested)  BED MOBILITY:  Not tested Pt reports difficulty getting out of the bed due to back pain and has increased difficulty getting out on the R  side   TRANSFERS: Sit to stand: SBA  Assistive device utilized: None     Stand to sit: SBA  Assistive device utilized: None      RAMP:  Not tested  CURB:  Not tested  STAIRS: Not tested GAIT: Gait pattern: step through pattern, decreased step length- Right, decreased stance time- Left, decreased stride length, decreased ankle dorsiflexion- Right, knee flexed in stance- Right, knee flexed in stance- Left, lateral hip instability, lateral lean- Right, decreased trunk rotation, trunk flexed, and poor foot clearance- Right Distance walked: Various clinic distances  Assistive device utilized: Single point cane and None Level of assistance: Modified independence Comments: Pt presented to session carrying SPC but did not use  it during session. Noted continued truncal lean to R side but no overt LOB noted.   VITALS  Vitals:   12/24/23 1109 12/24/23 1110  BP: 120/73 124/72  Pulse: 75 83     TREATMENT:    Self Care: Assessed BP in LUE while seated and standing (see above) and WNL this date. Recommended pt continue wearing his compression socks.   Physical Performance   [facility] PT Assessment - 12/24/23 1112       Ambulation/Gait   Gait velocity 32.8' over 11.87s = 2.76 ft/s no AD      Mini-BESTest   Sit To Stand Normal: Comes to stand without use of hands and stabilizes independently.    Rise to Toes Moderate: Heels up, but not full range (smaller than when holding hands), OR noticeable instability for 3 s.    Stand on one leg (left) Moderate: < 20 s   3.06s   Stand on one leg (right) Moderate: < 20 s   13.34s   Stand on one leg - lowest score 1    Compensatory Stepping Correction - Forward Normal: Recovers independently with a single, large step (second realignement is allowed).    Compensatory Stepping Correction - Backward Normal: Recovers independently with a single, large step    Compensatory Stepping Correction - Left Lateral Normal: Recovers independently with 1 step (crossover or lateral OK)   crossover step   Compensatory Stepping Correction - Right Lateral Normal: Recovers independently with 1 step (crossover or lateral OK)   crossover step   Stepping Corredtion Lateral - lowest score 2    Stance - Feet together, eyes open, firm surface  Normal: 30s    Stance - Feet together, eyes closed, foam surface  Normal: 30s    Incline - Eyes Closed Normal: Stands independently 30s and aligns with gravity    Change in Gait Speed Normal: Significantly changes walkling speed without imbalance    Walk with head turns - Horizontal Normal: performs head turns with no change in gait speed and good balance    Walk with pivot turns Moderate:Turns with feet close SLOW (>4 steps) with good balance.    Step over  obstacles Normal: Able to step over box with minimal change of gait speed and with good balance.    Timed UP & GO with Dual Task Moderate: Dual Task affects either counting OR walking (>10%) when compared to the TUG without Dual Task.    Mini-BEST total score 24      Timed Up and Go Test   Normal TUG (seconds) 9.57    Cognitive TUG (seconds) 13.94   retro counting by 3 starting at 67; on first attempt, pt unable to remember TUG instructions and walked past line, requiring therapist cues to turn around   TUG  Comments no AD          Reviewed importance of limiting distractions w/gait as pt has impaired dual-tasking. Pt in agreement to schedule 60mo PD screens as well.    PATIENT EDUCATION: Education details: Continue HEP, goal results, limiting distractions w/gait  Person educated: Patient Education method: Explanation, Demonstration, and Verbal cues Education comprehension: verbalized understanding and returned demonstration  HOME EXERCISE PROGRAM:  Standing PWR moves Multi-directional stepping sheet  Pt also has a stretching program he has at home.    Access Code: [email or web address] URL: [email or web address] Date: 11/09/2023 Prepared by: Marlon Jaqueline Uber  Exercises - Heel Toe Raises with Counter Support  - 1 x daily - 5 x weekly - 1-2 sets - 10 reps - Standing Single Leg Stance with Counter Support  - 1 x daily - 5 x weekly - 3 sets - 10-15 hold - Sidelying Open Book Thoracic Lumbar Rotation and Extension  - 1 x daily - 7 x weekly - 3 sets - 10 reps - Standing Near Stance in Corner with Eyes Closed  - 1 x daily - 7 x weekly - 3 sets - 30-45 seconds  hold - Prone Middle Trapezius with Legs Straight on Swiss Ball  - 1 x daily - 7 x weekly - 3 sets - 10 reps - Prone Lower Trapezius with Legs Straight on Swiss Ball  - 1 x daily - 7 x weekly - 3 sets - 10 reps - Supine Thoracic Mobilization Towel Roll Vertical with Arm Stretch  - 1 x daily - 7 x weekly  GOALS: Goals reviewed  with patient? Yes  SHORT TERM GOALS: Target date: 11/24/2023   Pt will improve miniBEST to at least a 22/28 in order to demo decr fall risk.  Baseline:20/28 12/06/2023: 24/28, no AD Goal status: MET  2.  Pt will improve gait velocity to at least 2.7 ft/s w/LRAD for improved gait efficiency and independence   Baseline: 2.55 ft/s w/rollator; 2.7 ft/s w/no AD (11/21) Goal status: MET   3.  Pt will verbalize and demonstrate understanding of proper bed mobility techniques for improved independence and efficiency w/bed mobility at home  Baseline: demonstrated on 11/30/23 Goal status: MET   LONG TERM GOALS: Target date: 12/22/2023   Pt will improve miniBEST to at least a 25/28 in order to demo decr fall risk. Baseline: 20/28; 24/28  Goal status: PARTIALLY MET   2.  Pt will improve gait velocity to at least 3.0 ft/s w/LRAD for improved gait efficiency and independence  Baseline: 2.55 ft/s w/rollator; 2.76 ft/s w/no AD Goal status: NOT MET   ASSESSMENT:  CLINICAL IMPRESSION: Emphasis of skilled PT session on LTG assessment and DC from PT. Pt did not wear compression socks today but BP was WNL in both seated and standing position. Encouraged pt to continue wearing compression socks daily, pt verbalized understanding. Pt did improve his gait speed from eval without use of AD today, but did not quite meet his goal. Pt also improved his score on MiniBest from eval, but missed his goal by 1 point so consider goal partially met. Pt continues to be most limited by stability w/anterior weight shift and dual-tasking and pt encouraged to reduce distractions w/gait to reduce fall risk. Pt in agreement to DC this date and has graduated from using a rollator to only a SPC on occasion. Pt scheduled for 60mo PD screens as well.    OBJECTIVE IMPAIRMENTS: Abnormal gait, decreased activity tolerance, decreased balance, decreased coordination, decreased knowledge of use  of DME, decreased mobility,  difficulty walking, decreased strength, increased fascial restrictions, increased muscle spasms, impaired sensation, improper body mechanics, postural dysfunction, and pain  ACTIVITY LIMITATIONS: carrying, lifting, bending, standing, squatting, stairs, transfers, bed mobility, reach over head, locomotion level, and caring for others  PARTICIPATION LIMITATIONS: meal prep, cleaning, laundry, interpersonal relationship, shopping, community activity, and yard work  PERSONAL FACTORS: Fitness, Past/current experiences, and 1-2 comorbidities: CVA and PD are also affecting patient's functional outcome.   REHAB POTENTIAL: Good  CLINICAL DECISION MAKING: Evolving/moderate complexity  EVALUATION COMPLEXITY: Moderate  PLAN:  PT FREQUENCY: 2x/week  PT DURATION: 8 weeks  PLANNED INTERVENTIONS: 97164- PT Re-evaluation, 97750- Physical Performance Testing, 97110-Therapeutic exercises, 97530- Therapeutic activity, 97112- Neuromuscular re-education, 97535- Self Care, [id number]- Manual therapy, [phone]- Gait training, [phone]- Aquatic Therapy, [phone]- Electrical stimulation (manual), [phone] (1-2 muscles), [phone] (3+ muscles)- Dry Needling, Patient/Family education, Balance training, Stair training, Joint mobilization, Spinal mobilization, Vestibular training, and DME instructions  Marlon BRAVO Holli Rengel, PT, DPT 12/24/2023, 11:36 AM

## 2023-12-24 NOTE — Therapy (Signed)
 OUTPATIENT OCCUPATIONAL THERAPY [facility]  Patient Name: Joshua [name] MRN: [id number] DOB:05-May-1945, 78 y.o., male Today's Date: 12/24/2023  PCP: Loreli Elsie JONETTA Mickey., MD  REFERRING PROVIDER: Evonnie Asberry RAMAN, DO  END OF SESSION:  OT End of Session - 12/24/23 1104     Visit Number 9    Number of Visits 17    Date for Recertification  01/21/24    Authorization Type [employer]    OT Start Time 1017    OT Stop Time 1102    OT Time Calculation (min) 45 min    Equipment Utilized During Treatment PD testing materials, 9HPT, box and blocks, button hook    Activity Tolerance Patient tolerated treatment well    Behavior During Therapy [facility] for tasks assessed/performed          Past Medical History:  Diagnosis Date   Allergic rhinitis    Anxiety    from chronic pain from surgery- on Cymbalta    Arthritis    Asthma    Barrett's esophagus 03/29/2014   Carotid artery disease    Carotid Doppler normal August, 2007   Coronary artery disease    Diverticulosis    Dyslipidemia    GERD (gastroesophageal reflux disease)    History of loop recorder    has since 04/04/15   HTN (hypertension)    takes Metoprolol  for PVC control   Hx of colonic polyps    adenomatous   IFG (impaired fasting glucose)    Myocardial infarction (HCC)    mild   Neuromuscular disorder (HCC)    parkinson's tremors in hands   Palpitations    Benign PVCs   Parkinson disease (HCC)    Pneumonia    Prostate cancer (HCC)    RBBB (right bundle branch block)    rate related   Shingles    Stroke [facility]) 2017   January 01, 2023,right hand weakness,   TIA (transient ischemic attack) 02/2015   Per pt, had 2 strokes   Vertigo    Past Surgical History:  Procedure Laterality Date   BACK SURGERY  2002,2009   x 6   CHOLECYSTECTOMY  11/30/2012   with IOC   COLONOSCOPY     ELECTROPHYSIOLOGIC STUDY N/A 09/26/2015   Procedure: V Tach Ablation (PVC);  Surgeon: Will Gladis Norton, MD;   Location: [facility] INVASIVE CV LAB;  Service: Cardiovascular;  Laterality: N/A;   EP IMPLANTABLE DEVICE N/A 04/04/2015   Procedure: Loop Recorder Insertion;  Surgeon: Will Gladis Norton, MD;  Location: [facility] INVASIVE CV LAB;  Service: Cardiovascular;  Laterality: N/A;   HERNIA REPAIR     laprascopic   KNEE SURGERY     DECEMBER 2017,LEFT KNEE SCOPED   LEFT HEART CATH AND CORONARY ANGIOGRAPHY N/A 02/05/2021   Procedure: LEFT HEART CATH AND CORONARY ANGIOGRAPHY;  Surgeon: Darron Deatrice LABOR, MD;  Location: [facility] INVASIVE CV LAB;  Service: Cardiovascular;  Laterality: N/A;   NECK SURGERY  2002   POLYPECTOMY     ROTATOR CUFF REPAIR Left    TEE WITHOUT CARDIOVERSION N/A 04/04/2015   Procedure: TRANSESOPHAGEAL ECHOCARDIOGRAM (TEE);  Surgeon: Redell RAMAN Shallow, MD;  Location: [facility] ENDOSCOPY;  Service: Cardiovascular;  Laterality: N/A;   TOTAL KNEE ARTHROPLASTY Left 09/28/2022   Procedure: TOTAL KNEE ARTHROPLASTY;  Surgeon: Edna Toribio LABOR, MD;  Location: [facility] ORS;  Service: Orthopedics;  Laterality: Left;   TOTAL KNEE ARTHROPLASTY Right 01/11/2023   Procedure: TOTAL KNEE ARTHROPLASTY;  Surgeon: Edna Toribio LABOR, MD;  Location: [facility] ORS;  Service:  Orthopedics;  Laterality: Right;   TRIGGER FINGER RELEASE Left 12/28/2019   Procedure: RELEASE TRIGGER FINGER/A-1 PULLEY THUMB, MIDDLE AND RING;  Surgeon: Murrell Kuba, MD;  Location: [facility];  Service: Orthopedics;  Laterality: Left;  FAB   UPPER GASTROINTESTINAL ENDOSCOPY     V Tach ablation  09/26/2015   Patient Active Problem List   Diagnosis Date Noted   CVA (cerebral vascular accident) (HCC) 01/15/2023   Primary osteoarthritis of right knee 01/11/2023   Localized osteoarthritis of right knee 01/11/2023   Primary osteoarthritis of left knee 09/28/2022   SBO (small bowel obstruction) (HCC) 05/29/2022   Chest pain 05/26/2022   Epigastric abdominal pain 05/26/2022   Coronary artery disease involving native coronary artery of native heart  without angina pectoris 05/28/2021   History of myocardial infarction 02/05/2021   Parkinson's disease (HCC)    Malignant neoplasm of prostate (HCC) 11/01/2017   Essential tremor 12/06/2015   PVC (premature ventricular contraction) 09/26/2015   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) 04/07/2015   Acute CVA (cerebrovascular accident) (HCC) 03/06/2015   Stroke (cerebrum) (HCC) 03/05/2015   Cerebral infarction (HCC) 03/05/2015   Weakness of right upper extremity    Tremor    Ejection fraction    Vertigo    Sleep apnea    Palpitations    Hyperlipidemia with target LDL less than 70    GERD (gastroesophageal reflux disease)    HTN (hypertension)    Chest pain    RBBB (right bundle branch block)    Ejection fraction    Carotid artery disease (HCC)    ONSET DATE: 11/03/2023 (Date of referral)  REFERRING DIAG: G20.B1 (ICD-10-CM) - Parkinson's disease with dyskinesia, without mention of fluctuations   THERAPY DIAG:  Muscle weakness (generalized)  Other lack of coordination  Essential tremor  Dysarthria and anarthria  Other symptoms and signs involving the musculoskeletal system  Rationale for Evaluation and Treatment: Rehabilitation  SUBJECTIVE:   SUBJECTIVE STATEMENT: I've been practicing my handwriting, I do better with the pencil I have.  Pt accompanied by: self  PERTINENT HISTORY: Rt TKA 01/11/23 with subsequent acute Lt CVA affecting Rt side, PD, Anxiety, HTN, HLD, multiple back surgeries/fusion neck and lumbar spine,  NSTEMI, 01/2020, hx of multiple syncopal episodes, CVA in 2017   PRECAUTIONS: Fall  WEIGHT BEARING RESTRICTIONS: No  PAIN:  Are you having pain? Yes: NPRS scale: 7/10 Pain location: back Pain description: pulled Aggravating factors: exercises Relieving factors: rest, medications  FALLS: Has patient fallen in last 6 months? Yes. Number of falls 3  LIVING ENVIRONMENT: Lives with: lives with their spouse Lives in:  House/apartment Stairs: Yes: External: 1 steps; none Has following equipment at home: Single point cane, Walker - 2 wheeled, Environmental Consultant - 4 wheeled, shower chair, Grab bars, and [employer] and transport chair  PLOF: Independent; horticulturist, commercial; driving   PATIENT GOALS: To improve dishes, folding laundry, and   OBJECTIVE:  Note: Objective measures were completed at Evaluation unless otherwise noted.  HAND DOMINANCE: Right  ADLs: Overall ADLs: mod I  Eating: setup for cutting salad  Equipment: hower seat with back, Grab bars, Walk in shower, Reacher, Sock aid, Long handled shoe horn, and Long handled sponge  IADLs: Shopping: supervision Light housekeeping: mod I Meal Prep: mod I Community mobility: driving mod I Medication management: mod I Financial management: 50/50 with wife Handwriting: 25% legible or less with pen vs pencil  MOBILITY STATUS: Needs Assist: uses SPC and Hx of falls  POSTURE COMMENTS:  rounded shoulders, forward head, increased thoracic kyphosis, posterior pelvic tilt, and weight shift right   ACTIVITY TOLERANCE: Activity tolerance: good to fair  FUNCTIONAL OUTCOME MEASURES: 11/12/2023: Fastening/unfastening 3 buttons: only able to button 2 buttons in 150 seconds  11/16/2023: using button hook for buttoning and unbuttoning, pt able to complete in 102 seconds 11/26/2023: 72 sec following repeat education 12/24/23: 70 seconds   Physical performance test: PPT#2 (simulated eating) Right: 25 seconds; Left: 24 sec    PPT#4 (donning/doffing jacket): TBD  Baseline: 30 seconds 12/24/23: 20 seconds  Goal status: GOAL MET  HAND FUNCTION: Grip strength: Right: 28.2 lbs  COORDINATION: 9 Hole Peg test: Right: 168 sec; Left: 57 sec Box and Blocks:  Right 25 blocks, Left 43 blocks   9HPT: 12/24/23: Right: Able to put in 4 in 3 minutes, Left: 43.45 seconds  Box and Blocks: 12/24/23: Right: 31 blocks, Left 43   UE ROM:  WFL; chronic wrist injury  with limited flexion and no ext.  UE MMT:   WFL  SENSATION: Moderate to severe paresthesias in R hand and fingers primarily but extends up to shoulder, worse since stroke   MUSCLE TONE: RUE: Rigidity; mild   COGNITION: Overall cognitive status: Within functional limits for tasks assessed  OBSERVATIONS: Bradykinesia and Postural tremors                                                                                                                   TREATMENT : 12/24/23  Self care training completed for duration below including: -Re-assessed all STGs/LTGs. See Goals for updated measurements. Pt met STG for buttoning/unbuttoning and LTG for donning/doffing jacket.  Pt reports successful completion of HEPs with no questions at this time. Educated pt in importance of practicing at home, including using his weighted utensils at home and practicing scooping dry food from bowls. Pt agreed.  PATIENT EDUCATION: Education details: see above Person educated: Patient Education method: Explanation, Demonstration, Tactile cues, and Verbal cues Education comprehension: verbalized understanding, returned demonstration, verbal cues required, tactile cues required, and needs further education  HOME EXERCISE PROGRAM: 11/12/2023: buttoning 11/16/2023: Big ADLs; coordination; PWR! Hands 11/26/2023: Stocking donning 12/06/23: tremor strategies, ex's for neck and posture  GOALS:  SHORT TERM GOALS: Target date: 12/07/2023    Pt will be independent with PD specific HEP.  Baseline: not yet initiated Goal status: IN PROGRESS  2.  Pt will verbalize understanding of adapted strategies to maximize safety and independence with ADLs/IADLs.  Baseline: not yet initiated Goal status: IN PROGRESS  3.  Pt will write a sentence with no significant decrease in size and maintain 50% legibility.  Baseline: <25% legibility 12/08/2023: no significant increase to size, use of weighted pen, and >50%  legibility Goal status: MET  4.  Pt will demonstrate improved fine motor coordination for ADLs as evidenced by decreasing 9 hole peg test score for each hand by at least 8 seconds.  Baseline: Right: 168 sec; Left: 57 sec 12/24/23: Right: Able to put in  4 in 3 minutes, Left: 43.45 seconds Goal status: IN PROGRESS  5.  Pt will demonstrate improved ease with fastening buttons as evidenced by improving 3 button/unbutton time to be able to complete full assessment in 85 seconds or less.  Baseline: only able to button 2 buttons in 150 seconds - pt reported unable to feel his fingertips at time of eval 11/16/2023: using button hook for buttoning and unbuttoning, pt able to complete in 102 seconds 11/26/2023: 72 sec following repeat education 12/24/23: 70 seconds  Goal status: GOAL MET  LONG TERM GOALS: Target date: 01/21/2024    1.  Pt will verbalize understanding of ways to prevent future PD related complications and PD community resources.  Baseline: not yet initiated Goal status: INITIAL  2.  Pt will write a short paragraph with no significant decrease in size and maintain 75% legibility.  Baseline: <25% legibility 12/24/23: with increased time, 50% legible Goal status: IN PROGRESS  3.  Pt will verbalize understanding of ways to keep thinking skills sharp and ways to compensate for STM changes in the future.  Baseline: not yet initiated Goal status: INITIAL  4.  Pt will demonstrate improved ease with feeding as evidenced by decreasing PPT#2 by at least 4 seconds.  Baseline: Right: 25 seconds; Left: 24 sec  12/24/23: Right: 31 seconds with standard spoon, 35 seconds weighted spoon (dropped one),   Left: 27 seconds weighted spoon, 14 seconds standard spoon Goal status: IN PROGRESS  5.  Pt will demonstrate increased ease with dressing as evidenced by decreasing PPT#4 (don/ doff jacket) to 20 secs or less.  Baseline: 30 seconds 12/24/23: 20 seconds  Goal status: GOAL MET  6.  Pt  will demonstrate improved fine motor coordination for ADLs as evidenced by decreasing 9 hole peg test score for each hand by at least 12 secs  Baseline: Right: 168 sec; Left: 57 sec 12/24/23: Right: Able to put in 4 in 3 minutes, Left: 43.45 seconds Goal status: IN PROGRESS  7.  Pt will be able to place at least 8 blocks using R hand with completion of Box and Blocks test.  Baseline:  Right 25 blocks, Left 43 blocks 12/24/23: Right: 31 blocks, Left 43,  Goal status: IN PROGRESS  ASSESSMENT:  CLINICAL IMPRESSION: Pt demonstrating some progress in function as seen in meeting STG 5 and LTG 5 this date. Some decline noted in R FM coordination d/t paresthesias in R hand affecting ability to complete 9HPT, pt also reported he was somewhat nervous this date. Pt participating well with HEPs prescribed. Continue to work and progress toward remaining goals.   PERFORMANCE DEFICITS: in functional skills including ADLs, IADLs, coordination, dexterity, strength, Fine motor control, Gross motor control, mobility, decreased knowledge of precautions, decreased knowledge of use of DME, and UE functional use.   IMPAIRMENTS: are limiting patient from ADLs, IADLs, leisure, and social participation.   COMORBIDITIES:  may have co-morbidities  that affects occupational performance. Patient will benefit from skilled OT to address above impairments and improve overall function.  REHAB POTENTIAL: Good  PLAN:  OT FREQUENCY: 1-2x/week  OT DURATION: 8 weeks  PLANNED INTERVENTIONS: 97168 OT Re-evaluation, 97535 self care/ADL training, 02889 therapeutic exercise, 97530 therapeutic activity, 97112 neuromuscular re-education, 97750 Physical Performance Testing, functional mobility training, coping strategies training, patient/family education, and DME and/or AE instructions  RECOMMENDED OTHER SERVICES: N/A for this visit  CONSULTED AND AGREED WITH PLAN OF CARE: Patient  PLAN FOR NEXT SESSION:  review prior  HEPs Pt to bring  in adapted pen to demo use  Sensitization?   Rocky Dutch, OT 12/24/2023, 11:17 AM

## 2023-12-28 ENCOUNTER — Ambulatory Visit

## 2023-12-28 DIAGNOSIS — M6281 Muscle weakness (generalized): Secondary | ICD-10-CM

## 2023-12-28 DIAGNOSIS — G250 Essential tremor: Secondary | ICD-10-CM

## 2023-12-28 DIAGNOSIS — R278 Other lack of coordination: Secondary | ICD-10-CM

## 2023-12-28 DIAGNOSIS — R29898 Other symptoms and signs involving the musculoskeletal system: Secondary | ICD-10-CM

## 2023-12-28 NOTE — Patient Instructions (Addendum)
Bag Exercises:  Small trash bag or produce bag works best.  For all exercises, sit with big posture (sit up tall with head up) and use big movements. Perform the following exercises 1 times per day.  Hold end of bag in one hand. Stretch fingers out big to draw the entire bag into your palm. Repeat 5 times with each hand.  Hold bag in one hand. Stretch both arms/hands out to the side as big as you can. Then, pass bag from one hand to the other IN FRONT of you. Stretch arms back out big after each pass. Repeat 10 times.  Hold bag in one hand. Stretch both arms/hands out to the side as big as you can. Then, pass bag from one hand to the other BEHIND you. Stretch arms back out big after each pass. Repeat 10 times.  Hold bag in one hand. Stretch both arms/hands out to the side in a diagonal as big as you can. Then, pass bag from one hand to the other IN FRONT of you in a diagonal pattern. Stretch arms back out big after each pass. Repeat 10 times.  Hold bag in both hands in front of you with hands/arms shoulder length apart. Move bag behind your head. Repeat 10 times.  Hold bag in both hands in front of you with hands/arms shoulder length apart. Lift leg and move bag completely under each foot and back. Repeat 10 times on each side.  Hold bag in right hand. Move right hand to reach behind shoulder. Then, reach behind back with left hand to pass bag from right hand to left hand. Switch sides. Repeat 10 times on each side.  Hold bag in one hand. Toss bag up and catch with the same/opposite hand. Repeat 10 times with each hand.

## 2023-12-28 NOTE — Therapy (Signed)
 OUTPATIENT OCCUPATIONAL THERAPY [facility]  Patient Name: Joshua [name] MRN: [id number] DOB:Aug 11, 1945, 78 y.o., male Today's Date: 12/28/2023  PCP: Loreli Elsie JONETTA Mickey., MD  REFERRING PROVIDER: Evonnie Asberry RAMAN, DO  END OF SESSION:  OT End of Session - 12/28/23 1107     Visit Number 10    Number of Visits 17    Date for Recertification  01/21/24    Authorization Type [employer]    OT Start Time 1105    OT Stop Time 1147    OT Time Calculation (min) 42 min    Equipment Utilized During Treatment plastic bag, towel, weighted pen    Activity Tolerance Patient tolerated treatment well    Behavior During Therapy WFL for tasks assessed/performed          Past Medical History:  Diagnosis Date   Allergic rhinitis    Anxiety    from chronic pain from surgery- on Cymbalta    Arthritis    Asthma    Barrett's esophagus 03/29/2014   Carotid artery disease    Carotid Doppler normal August, 2007   Coronary artery disease    Diverticulosis    Dyslipidemia    GERD (gastroesophageal reflux disease)    History of loop recorder    has since 04/04/15   HTN (hypertension)    takes Metoprolol  for PVC control   Hx of colonic polyps    adenomatous   IFG (impaired fasting glucose)    Myocardial infarction (HCC)    mild   Neuromuscular disorder (HCC)    parkinson's tremors in hands   Palpitations    Benign PVCs   Parkinson disease (HCC)    Pneumonia    Prostate cancer (HCC)    RBBB (right bundle branch block)    rate related   Shingles    Stroke [facility]) 2017   January 01, 2023,right hand weakness,   TIA (transient ischemic attack) 02/2015   Per pt, had 2 strokes   Vertigo    Past Surgical History:  Procedure Laterality Date   BACK SURGERY  2002,2009   x 6   CHOLECYSTECTOMY  11/30/2012   with IOC   COLONOSCOPY     ELECTROPHYSIOLOGIC STUDY N/A 09/26/2015   Procedure: V Tach Ablation (PVC);  Surgeon: Will Gladis Norton, MD;  Location: [facility]  INVASIVE CV LAB;  Service: Cardiovascular;  Laterality: N/A;   EP IMPLANTABLE DEVICE N/A 04/04/2015   Procedure: Loop Recorder Insertion;  Surgeon: Will Gladis Norton, MD;  Location: [facility] INVASIVE CV LAB;  Service: Cardiovascular;  Laterality: N/A;   HERNIA REPAIR     laprascopic   KNEE SURGERY     DECEMBER 2017,LEFT KNEE SCOPED   LEFT HEART CATH AND CORONARY ANGIOGRAPHY N/A 02/05/2021   Procedure: LEFT HEART CATH AND CORONARY ANGIOGRAPHY;  Surgeon: Darron Deatrice LABOR, MD;  Location: [facility] INVASIVE CV LAB;  Service: Cardiovascular;  Laterality: N/A;   NECK SURGERY  2002   POLYPECTOMY     ROTATOR CUFF REPAIR Left    TEE WITHOUT CARDIOVERSION N/A 04/04/2015   Procedure: TRANSESOPHAGEAL ECHOCARDIOGRAM (TEE);  Surgeon: Redell RAMAN Shallow, MD;  Location: [facility] ENDOSCOPY;  Service: Cardiovascular;  Laterality: N/A;   TOTAL KNEE ARTHROPLASTY Left 09/28/2022   Procedure: TOTAL KNEE ARTHROPLASTY;  Surgeon: Edna Toribio LABOR, MD;  Location: [facility] ORS;  Service: Orthopedics;  Laterality: Left;   TOTAL KNEE ARTHROPLASTY Right 01/11/2023   Procedure: TOTAL KNEE ARTHROPLASTY;  Surgeon: Edna Toribio LABOR, MD;  Location: [facility] ORS;  Service: Orthopedics;  Laterality: Right;   TRIGGER FINGER RELEASE Left 12/28/2019   Procedure: RELEASE TRIGGER FINGER/A-1 PULLEY THUMB, MIDDLE AND RING;  Surgeon: Murrell Kuba, MD;  Location: [facility];  Service: Orthopedics;  Laterality: Left;  FAB   UPPER GASTROINTESTINAL ENDOSCOPY     V Tach ablation  09/26/2015   Patient Active Problem List   Diagnosis Date Noted   CVA (cerebral vascular accident) (HCC) 01/15/2023   Primary osteoarthritis of right knee 01/11/2023   Localized osteoarthritis of right knee 01/11/2023   Primary osteoarthritis of left knee 09/28/2022   SBO (small bowel obstruction) (HCC) 05/29/2022   Chest pain 05/26/2022   Epigastric abdominal pain 05/26/2022   Coronary artery disease involving native coronary artery of native heart without angina  pectoris 05/28/2021   History of myocardial infarction 02/05/2021   Parkinson's disease (HCC)    Malignant neoplasm of prostate (HCC) 11/01/2017   Essential tremor 12/06/2015   PVC (premature ventricular contraction) 09/26/2015   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) 04/07/2015   Acute CVA (cerebrovascular accident) (HCC) 03/06/2015   Stroke (cerebrum) (HCC) 03/05/2015   Cerebral infarction (HCC) 03/05/2015   Weakness of right upper extremity    Tremor    Ejection fraction    Vertigo    Sleep apnea    Palpitations    Hyperlipidemia with target LDL less than 70    GERD (gastroesophageal reflux disease)    HTN (hypertension)    Chest pain    RBBB (right bundle branch block)    Ejection fraction    Carotid artery disease (HCC)    ONSET DATE: 11/03/2023 (Date of referral)  REFERRING DIAG: G20.B1 (ICD-10-CM) - Parkinson's disease with dyskinesia, without mention of fluctuations   THERAPY DIAG:  Muscle weakness (generalized)  Other symptoms and signs involving the musculoskeletal system  Other lack of coordination  Essential tremor  Rationale for Evaluation and Treatment: Rehabilitation  SUBJECTIVE:   SUBJECTIVE STATEMENT: I did my PWR hands today and things are going pretty well.  Pt accompanied by: self  PERTINENT HISTORY: Rt TKA 01/11/23 with subsequent acute Lt CVA affecting Rt side, PD, Anxiety, HTN, HLD, multiple back surgeries/fusion neck and lumbar spine,  NSTEMI, 01/2020, hx of multiple syncopal episodes, CVA in 2017   PRECAUTIONS: Fall  WEIGHT BEARING RESTRICTIONS: No  PAIN:  Are you having pain? Yes: NPRS scale: 7/10 Pain location: back Pain description: pulled Aggravating factors: exercises Relieving factors: rest, medications  FALLS: Has patient fallen in last 6 months? Yes. Number of falls 3  LIVING ENVIRONMENT: Lives with: lives with their spouse Lives in: House/apartment Stairs: Yes: External: 1 steps; none Has following  equipment at home: Single point cane, Walker - 2 wheeled, Environmental Consultant - 4 wheeled, shower chair, Grab bars, and [employer] and transport chair  PLOF: Independent; horticulturist, commercial; driving   PATIENT GOALS: To improve dishes, folding laundry, and   OBJECTIVE:  Note: Objective measures were completed at Evaluation unless otherwise noted.  HAND DOMINANCE: Right  ADLs: Overall ADLs: mod I  Eating: setup for cutting salad  Equipment: hower seat with back, Grab bars, Walk in shower, Reacher, Sock aid, Long handled shoe horn, and Long handled sponge  IADLs: Shopping: supervision Light housekeeping: mod I Meal Prep: mod I Community mobility: driving mod I Medication management: mod I Financial management: 50/50 with wife Handwriting: 25% legible or less with pen vs pencil  MOBILITY STATUS: Needs Assist: uses SPC and Hx of falls   POSTURE COMMENTS:  rounded shoulders, forward  head, increased thoracic kyphosis, posterior pelvic tilt, and weight shift right   ACTIVITY TOLERANCE: Activity tolerance: good to fair  FUNCTIONAL OUTCOME MEASURES: 11/12/2023: Fastening/unfastening 3 buttons: only able to button 2 buttons in 150 seconds  11/16/2023: using button hook for buttoning and unbuttoning, pt able to complete in 102 seconds 11/26/2023: 72 sec following repeat education 12/24/23: 70 seconds   Physical performance test: PPT#2 (simulated eating) Right: 25 seconds; Left: 24 sec    PPT#4 (donning/doffing jacket): TBD  Baseline: 30 seconds 12/24/23: 20 seconds  Goal status: GOAL MET  HAND FUNCTION: Grip strength: Right: 28.2 lbs  COORDINATION: 9 Hole Peg test: Right: 168 sec; Left: 57 sec Box and Blocks:  Right 25 blocks, Left 43 blocks   9HPT: 12/24/23: Right: Able to put in 4 in 3 minutes, Left: 43.45 seconds  Box and Blocks: 12/24/23: Right: 31 blocks, Left 43   UE ROM:  WFL; chronic wrist injury with limited flexion and no ext.  UE MMT:    WFL  SENSATION: Moderate to severe paresthesias in R hand and fingers primarily but extends up to shoulder, worse since stroke   MUSCLE TONE: RUE: Rigidity; mild   COGNITION: Overall cognitive status: Within functional limits for tasks assessed  OBSERVATIONS: Bradykinesia and Postural tremors                                                                                                                   TREATMENT : 12/28/23 - Self-care/home management completed for duration as noted below including:  Pt brought weighted pen this date, wrote short paragraph with L hand stabilizing R hand with increased time, approximately 60% legibility.  - Therapeutic exercises completed for duration as noted below including:  Pt instructed in bag exercises to help promote BIG movements in ADLs and reduce stiffness. Provided pt with handout, verbal instruction, and visual demo. Pt returned demo, min to mod cues required for proper form. Modification allowed for 2 exercises by completing behind the back exercise with towel rolled up lengthwise for increased ease in reaching, and theraband piece cut at approximately 2 feet for improved ease in bring band over and under foot.   PATIENT EDUCATION: Education details: see above Person educated: Patient Education method: Explanation, Demonstration, Tactile cues, and Verbal cues Education comprehension: verbalized understanding, returned demonstration, verbal cues required, tactile cues required, and needs further education  HOME EXERCISE PROGRAM: 11/12/2023: buttoning 11/16/2023: Big ADLs; coordination; PWR! Hands 11/26/2023: Stocking donning 12/06/23: tremor strategies, ex's for neck and posture  GOALS:  SHORT TERM GOALS: Target date: 01/21/2024    Pt will be independent with PD specific HEP.  Baseline: not yet initiated Goal status: IN PROGRESS  2.  Pt will verbalize understanding of adapted strategies to maximize safety and independence with  ADLs/IADLs.  Baseline: not yet initiated Goal status: IN PROGRESS  3.  Pt will write a sentence with no significant decrease in size and maintain 50% legibility.  Baseline: <25% legibility 12/08/2023: no significant increase to size, use of weighted pen,  and >50% legibility Goal status: MET  4.  Pt will demonstrate improved fine motor coordination for ADLs as evidenced by decreasing 9 hole peg test score for each hand by at least 8 seconds.  Baseline: Right: 168 sec; Left: 57 sec 12/24/23: Right: Able to put in 4 in 3 minutes, Left: 43.45 seconds Goal status: IN PROGRESS  5.  Pt will demonstrate improved ease with fastening buttons as evidenced by improving 3 button/unbutton time to be able to complete full assessment in 85 seconds or less.  Baseline: only able to button 2 buttons in 150 seconds - pt reported unable to feel his fingertips at time of eval 11/16/2023: using button hook for buttoning and unbuttoning, pt able to complete in 102 seconds 11/26/2023: 72 sec following repeat education 12/24/23: 70 seconds  Goal status: GOAL MET  LONG TERM GOALS: Target date: 01/21/2024    1.  Pt will verbalize understanding of ways to prevent future PD related complications and PD community resources.  Baseline: not yet initiated Goal status: IN PROGRESS  2.  Pt will write a short paragraph with no significant decrease in size and maintain 75% legibility.  Baseline: <25% legibility 12/24/23: with increased time, 50% legible 12/28/23: 60% legibility with increased time Goal status: IN PROGRESS  3.  Pt will verbalize understanding of ways to keep thinking skills sharp and ways to compensate for STM changes in the future.  Baseline: not yet initiated Goal status: IN PROGRESS  4.  Pt will demonstrate improved ease with feeding as evidenced by decreasing PPT#2 by at least 4 seconds.  Baseline: Right: 25 seconds; Left: 24 sec  12/24/23: Right: 31 seconds with standard spoon, 35 seconds  weighted spoon (dropped one),   Left: 27 seconds weighted spoon, 14 seconds standard spoon Goal status: IN PROGRESS  5.  Pt will demonstrate increased ease with dressing as evidenced by decreasing PPT#4 (don/ doff jacket) to 20 secs or less.  Baseline: 30 seconds 12/24/23: 20 seconds  Goal status: GOAL MET  6.  Pt will demonstrate improved fine motor coordination for ADLs as evidenced by decreasing 9 hole peg test score for each hand by at least 12 secs  Baseline: Right: 168 sec; Left: 57 sec 12/24/23: Right: Able to put in 4 in 3 minutes, Left: 43.45 seconds Goal status: IN PROGRESS  7.  Pt will be able to place at least 8 blocks using R hand with completion of Box and Blocks test.  Baseline:  Right 25 blocks, Left 43 blocks 12/24/23: Right: 31 blocks, Left 43,  Goal status: IN PROGRESS  ASSESSMENT:  CLINICAL IMPRESSION: This 1st progress note is for dates: 11/06/23 to 12/28/2023. Pt has met 2/5 STGs and 1/7 LTGs. Pt making progress towards goals as expected and continues to benefit from skilled OT services in the outpatient setting to work towards remaining goals or until max rehab potential is met.    PERFORMANCE DEFICITS: in functional skills including ADLs, IADLs, coordination, dexterity, strength, Fine motor control, Gross motor control, mobility, decreased knowledge of precautions, decreased knowledge of use of DME, and UE functional use.   IMPAIRMENTS: are limiting patient from ADLs, IADLs, leisure, and social participation.   COMORBIDITIES:  may have co-morbidities  that affects occupational performance. Patient will benefit from skilled OT to address above impairments and improve overall function.  REHAB POTENTIAL: Good  PLAN:  OT FREQUENCY: 1-2x/week  OT DURATION: 8 weeks  PLANNED INTERVENTIONS: 97168 OT Re-evaluation, 97535 self care/ADL training, 02889 therapeutic exercise, 97530 therapeutic activity,  02887 neuromuscular re-education, 97750 Physical Performance  Testing, functional mobility training, coping strategies training, patient/family education, and DME and/or AE instructions  RECOMMENDED OTHER SERVICES: N/A for this visit  CONSULTED AND AGREED WITH PLAN OF CARE: Patient  PLAN FOR NEXT SESSION:  F/u bag exercises Review PWR supine exercises    Xaviar Lunn, OT 12/28/2023, 12:00 PM

## 2023-12-30 ENCOUNTER — Ambulatory Visit

## 2023-12-30 DIAGNOSIS — R29898 Other symptoms and signs involving the musculoskeletal system: Secondary | ICD-10-CM

## 2023-12-30 DIAGNOSIS — M6281 Muscle weakness (generalized): Secondary | ICD-10-CM | POA: Diagnosis not present

## 2023-12-30 DIAGNOSIS — R471 Dysarthria and anarthria: Secondary | ICD-10-CM

## 2023-12-30 DIAGNOSIS — G250 Essential tremor: Secondary | ICD-10-CM

## 2023-12-30 NOTE — Therapy (Signed)
 OUTPATIENT OCCUPATIONAL THERAPY [facility] NOTE  Patient Name: Joshua [name] MRN: [id number] DOB:05/03/1945, 78 y.o., male Today's Date: 12/30/2023  PCP: Loreli Elsie JONETTA Mickey., MD  REFERRING PROVIDER: Evonnie Asberry RAMAN, DO  END OF SESSION:  OT End of Session - 12/30/23 1104     Visit Number 11    Number of Visits 17    Date for Recertification  01/21/24    Authorization Type [employer]    OT Start Time 1101    OT Stop Time 1142    OT Time Calculation (min) 41 min    Equipment Utilized During Treatment coins, towel, nuts and bolts    Activity Tolerance Patient tolerated treatment well    Behavior During Therapy WFL for tasks assessed/performed          Past Medical History:  Diagnosis Date   Allergic rhinitis    Anxiety    from chronic pain from surgery- on Cymbalta    Arthritis    Asthma    Barrett's esophagus 03/29/2014   Carotid artery disease    Carotid Doppler normal August, 2007   Coronary artery disease    Diverticulosis    Dyslipidemia    GERD (gastroesophageal reflux disease)    History of loop recorder    has since 04/04/15   HTN (hypertension)    takes Metoprolol  for PVC control   Hx of colonic polyps    adenomatous   IFG (impaired fasting glucose)    Myocardial infarction (HCC)    mild   Neuromuscular disorder (HCC)    parkinson's tremors in hands   Palpitations    Benign PVCs   Parkinson disease (HCC)    Pneumonia    Prostate cancer (HCC)    RBBB (right bundle branch block)    rate related   Shingles    Stroke [facility]) 2017   January 01, 2023,right hand weakness,   TIA (transient ischemic attack) 02/2015   Per pt, had 2 strokes   Vertigo    Past Surgical History:  Procedure Laterality Date   BACK SURGERY  2002,2009   x 6   CHOLECYSTECTOMY  11/30/2012   with IOC   COLONOSCOPY     ELECTROPHYSIOLOGIC STUDY N/A 09/26/2015   Procedure: V Tach Ablation (PVC);  Surgeon: Will Gladis Norton, MD;  Location: [facility] IN[facility];  Service: Cardiovascular;  Laterality: N/A;   EP IMPLANTABLE DEVICE N/A 04/04/2015   Procedure: Loop Recorder Insertion;  Surgeon: Will Gladis Norton, MD;  Location: [facility];  Service: Cardiovascular;  Laterality: N/A;   HERNIA REPAIR     laprascopic   KNEE SURGERY     DECEMBER 2017,LEFT KNEE SCOPED   LEFT HEART CATH AND CORONARY ANGIOGRAPHY N/A 02/05/2021   Procedure: LEFT HEART CATH AND CORONARY ANGIOGRAPHY;  Surgeon: Darron Deatrice LABOR, MD;  Location: [facility];  Service: Cardiovascular;  Laterality: N/A;   NECK SURGERY  2002   POLYPECTOMY     ROTATOR CUFF REPAIR Left    TEE WITHOUT CARDIOVERSION N/A 04/04/2015   Procedure: TRANSESOPHAGEAL ECHOCARDIOGRAM (TEE);  Surgeon: Redell RAMAN Shallow, MD;  Location: [facility] ENDOSCOPY;  Service: Cardiovascular;  Laterality: N/A;   TOTAL KNEE ARTHROPLASTY Left 09/28/2022   Procedure: TOTAL KNEE ARTHROPLASTY;  Surgeon: Edna Toribio LABOR, MD;  Location: [facility] ORS;  Service: Orthopedics;  Laterality: Left;   TOTAL KNEE ARTHROPLASTY Right 01/11/2023   Procedure: TOTAL KNEE ARTHROPLASTY;  Surgeon: Edna Toribio LABOR, MD;  Location: [facility] ORS;  Service: Orthopedics;  Laterality:  Right;   TRIGGER FINGER RELEASE Left 12/28/2019   Procedure: RELEASE TRIGGER FINGER/A-1 PULLEY THUMB, MIDDLE AND RING;  Surgeon: Murrell Kuba, MD;  Location: [facility];  Service: Orthopedics;  Laterality: Left;  FAB   UPPER GASTROINTESTINAL ENDOSCOPY     V Tach ablation  09/26/2015   Patient Active Problem List   Diagnosis Date Noted   CVA (cerebral vascular accident) (HCC) 01/15/2023   Primary osteoarthritis of right knee 01/11/2023   Localized osteoarthritis of right knee 01/11/2023   Primary osteoarthritis of left knee 09/28/2022   SBO (small bowel obstruction) (HCC) 05/29/2022   Chest pain 05/26/2022   Epigastric abdominal pain 05/26/2022   Coronary artery disease involving native coronary artery of native heart without angina pectoris  05/28/2021   History of myocardial infarction 02/05/2021   Parkinson's disease (HCC)    Malignant neoplasm of prostate (HCC) 11/01/2017   Essential tremor 12/06/2015   PVC (premature ventricular contraction) 09/26/2015   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) 04/07/2015   Acute CVA (cerebrovascular accident) (HCC) 03/06/2015   Stroke (cerebrum) (HCC) 03/05/2015   Cerebral infarction (HCC) 03/05/2015   Weakness of right upper extremity    Tremor    Ejection fraction    Vertigo    Sleep apnea    Palpitations    Hyperlipidemia with target LDL less than 70    GERD (gastroesophageal reflux disease)    HTN (hypertension)    Chest pain    RBBB (right bundle branch block)    Ejection fraction    Carotid artery disease (HCC)    ONSET DATE: 11/03/2023 (Date of referral)  REFERRING DIAG: G20.B1 (ICD-10-CM) - Parkinson's disease with dyskinesia, without mention of fluctuations   THERAPY DIAG:  Muscle weakness (generalized)  Essential tremor  Dysarthria and anarthria  Other symptoms and signs involving the musculoskeletal system  Rationale for Evaluation and Treatment: Rehabilitation  SUBJECTIVE:   SUBJECTIVE STATEMENT: I went to an exercise group yesterday. I haven't had a chance to do the bag exercises yet.   Pt accompanied by: self  PERTINENT HISTORY: Rt TKA 01/11/23 with subsequent acute Lt CVA affecting Rt side, PD, Anxiety, HTN, HLD, multiple back surgeries/fusion neck and lumbar spine,  NSTEMI, 01/2020, hx of multiple syncopal episodes, CVA in 2017   PRECAUTIONS: Fall  WEIGHT BEARING RESTRICTIONS: No  PAIN:  Are you having pain? Yes: NPRS scale: 7/10 Pain location: back Pain description: pulled Aggravating factors: exercises Relieving factors: rest, medications  FALLS: Has patient fallen in last 6 months? Yes. Number of falls 3  LIVING ENVIRONMENT: Lives with: lives with their spouse Lives in: House/apartment Stairs: Yes: External: 1 steps;  none Has following equipment at home: Single point cane, Walker - 2 wheeled, Environmental Consultant - 4 wheeled, shower chair, Grab bars, and [employer] and transport chair  PLOF: Independent; horticulturist, commercial; driving   PATIENT GOALS: To improve dishes, folding laundry, and   OBJECTIVE:  Note: Objective measures were completed at Evaluation unless otherwise noted.  HAND DOMINANCE: Right  ADLs: Overall ADLs: mod I  Eating: setup for cutting salad  Equipment: hower seat with back, Grab bars, Walk in shower, Reacher, Sock aid, Long handled shoe horn, and Long handled sponge  IADLs: Shopping: supervision Light housekeeping: mod I Meal Prep: mod I Community mobility: driving mod I Medication management: mod I Financial management: 50/50 with wife Handwriting: 25% legible or less with pen vs pencil  MOBILITY STATUS: Needs Assist: uses SPC and Hx of falls   POSTURE  COMMENTS:  rounded shoulders, forward head, increased thoracic kyphosis, posterior pelvic tilt, and weight shift right   ACTIVITY TOLERANCE: Activity tolerance: good to fair  FUNCTIONAL OUTCOME MEASURES: 11/12/2023: Fastening/unfastening 3 buttons: only able to button 2 buttons in 150 seconds  11/16/2023: using button hook for buttoning and unbuttoning, pt able to complete in 102 seconds 11/26/2023: 72 sec following repeat education 12/24/23: 70 seconds   Physical performance test: PPT#2 (simulated eating) Right: 25 seconds; Left: 24 sec    PPT#4 (donning/doffing jacket): TBD  Baseline: 30 seconds 12/24/23: 20 seconds  Goal status: GOAL MET  HAND FUNCTION: Grip strength: Right: 28.2 lbs  COORDINATION: 9 Hole Peg test: Right: 168 sec; Left: 57 sec Box and Blocks:  Right 25 blocks, Left 43 blocks   9HPT: 12/24/23: Right: Able to put in 4 in 3 minutes, Left: 43.45 seconds  Box and Blocks: 12/24/23: Right: 31 blocks, Left 43   UE ROM:  WFL; chronic wrist injury with limited flexion and no ext.  UE MMT:    WFL  SENSATION: Moderate to severe paresthesias in R hand and fingers primarily but extends up to shoulder, worse since stroke   MUSCLE TONE: RUE: Rigidity; mild   COGNITION: Overall cognitive status: Within functional limits for tasks assessed  OBSERVATIONS: Bradykinesia and Postural tremors                                                                                                                   TREATMENT : 12/30/23 - Therapeutic activities completed for duration as noted below including:   Review of coordination HEP, with addition of more activities such as inching fingers up and down pen, balling up tissues (modification of tearing tissue into long strips), and screwing/unscrewing nuts and bolts.   PATIENT EDUCATION: Education details: see above Person educated: Patient Education method: Explanation, Demonstration, Tactile cues, and Verbal cues Education comprehension: verbalized understanding, returned demonstration, verbal cues required, tactile cues required, and needs further education  HOME EXERCISE PROGRAM: 11/12/2023: buttoning 11/16/2023: Big ADLs; coordination; PWR! Hands 11/26/2023: Stocking donning 12/06/23: tremor strategies, ex's for neck and posture 12/28/23: bag exercises 12/30/23: additional coordination exercise GOALS:  SHORT TERM GOALS: Target date: 01/21/2024    Pt will be independent with PD specific HEP.  Baseline: not yet initiated Goal status: IN PROGRESS  2.  Pt will verbalize understanding of adapted strategies to maximize safety and independence with ADLs/IADLs.  Baseline: not yet initiated Goal status: IN PROGRESS  3.  Pt will write a sentence with no significant decrease in size and maintain 50% legibility.  Baseline: <25% legibility 12/08/2023: no significant increase to size, use of weighted pen, and >50% legibility Goal status: MET  4.  Pt will demonstrate improved fine motor coordination for ADLs as evidenced by decreasing 9  hole peg test score for each hand by at least 8 seconds.  Baseline: Right: 168 sec; Left: 57 sec 12/24/23: Right: Able to put in 4 in 3 minutes, Left: 43.45 seconds Goal status: IN PROGRESS  5.  Pt will demonstrate improved ease with fastening buttons as evidenced by improving 3 button/unbutton time to be able to complete full assessment in 85 seconds or less.  Baseline: only able to button 2 buttons in 150 seconds - pt reported unable to feel his fingertips at time of eval 11/16/2023: using button hook for buttoning and unbuttoning, pt able to complete in 102 seconds 11/26/2023: 72 sec following repeat education 12/24/23: 70 seconds  Goal status: GOAL MET  LONG TERM GOALS: Target date: 01/21/2024    1.  Pt will verbalize understanding of ways to prevent future PD related complications and PD community resources.  Baseline: not yet initiated Goal status: IN PROGRESS  2.  Pt will write a short paragraph with no significant decrease in size and maintain 75% legibility.  Baseline: <25% legibility 12/24/23: with increased time, 50% legible 12/28/23: 60% legibility with increased time Goal status: IN PROGRESS  3.  Pt will verbalize understanding of ways to keep thinking skills sharp and ways to compensate for STM changes in the future.  Baseline: not yet initiated Goal status: IN PROGRESS  4.  Pt will demonstrate improved ease with feeding as evidenced by decreasing PPT#2 by at least 4 seconds.  Baseline: Right: 25 seconds; Left: 24 sec  12/24/23: Right: 31 seconds with standard spoon, 35 seconds weighted spoon (dropped one),   Left: 27 seconds weighted spoon, 14 seconds standard spoon Goal status: IN PROGRESS  5.  Pt will demonstrate increased ease with dressing as evidenced by decreasing PPT#4 (don/ doff jacket) to 20 secs or less.  Baseline: 30 seconds 12/24/23: 20 seconds  Goal status: GOAL MET  6.  Pt will demonstrate improved fine motor coordination for ADLs as evidenced  by decreasing 9 hole peg test score for each hand by at least 12 secs  Baseline: Right: 168 sec; Left: 57 sec 12/24/23: Right: Able to put in 4 in 3 minutes, Left: 43.45 seconds Goal status: IN PROGRESS  7.  Pt will be able to place at least 8 blocks using R hand with completion of Box and Blocks test.  Baseline:  Right 25 blocks, Left 43 blocks 12/24/23: Right: 31 blocks, Left 43,  Goal status: IN PROGRESS  ASSESSMENT:  CLINICAL IMPRESSION: Pt this date seen for occupational therapy tx for PD mgmt. Pt making progress towards goals as expected and continues to benefit from skilled OT services in the outpatient setting to work towards remaining goals or until max rehab potential is met.    PERFORMANCE DEFICITS: in functional skills including ADLs, IADLs, coordination, dexterity, strength, Fine motor control, Gross motor control, mobility, decreased knowledge of precautions, decreased knowledge of use of DME, and UE functional use.   IMPAIRMENTS: are limiting patient from ADLs, IADLs, leisure, and social participation.   COMORBIDITIES:  may have co-morbidities  that affects occupational performance. Patient will benefit from skilled OT to address above impairments and improve overall function.  REHAB POTENTIAL: Good  PLAN:  OT FREQUENCY: 1-2x/week  OT DURATION: 8 weeks  PLANNED INTERVENTIONS: 97168 OT Re-evaluation, 97535 self care/ADL training, 02889 therapeutic exercise, 97530 therapeutic activity, 97112 neuromuscular re-education, 97750 Physical Performance Testing, functional mobility training, coping strategies training, patient/family education, and DME and/or AE instructions  RECOMMENDED OTHER SERVICES: N/A for this visit  CONSULTED AND AGREED WITH PLAN OF CARE: Patient  PLAN FOR NEXT SESSION:  Additional exercises prn Discuss AE     Rocky Dutch, OT 12/30/2023, 12:11 PM

## 2024-01-03 ENCOUNTER — Encounter: Payer: Self-pay | Admitting: Speech Pathology

## 2024-01-03 ENCOUNTER — Ambulatory Visit: Admitting: Speech Pathology

## 2024-01-03 ENCOUNTER — Ambulatory Visit

## 2024-01-03 DIAGNOSIS — G250 Essential tremor: Secondary | ICD-10-CM

## 2024-01-03 DIAGNOSIS — R278 Other lack of coordination: Secondary | ICD-10-CM

## 2024-01-03 DIAGNOSIS — R29898 Other symptoms and signs involving the musculoskeletal system: Secondary | ICD-10-CM

## 2024-01-03 DIAGNOSIS — R4701 Aphasia: Secondary | ICD-10-CM

## 2024-01-03 DIAGNOSIS — R471 Dysarthria and anarthria: Secondary | ICD-10-CM

## 2024-01-03 DIAGNOSIS — M6281 Muscle weakness (generalized): Secondary | ICD-10-CM

## 2024-01-03 NOTE — Patient Instructions (Addendum)
 [name]

## 2024-01-03 NOTE — Therapy (Signed)
 " OUTPATIENT OCCUPATIONAL THERAPY [facility] TRE[facility] NOTE  Patient Name: Joshua [name] MRN: [id number] DOB:06/28/1945, 78 y.o., male Today's Date: 01/03/2024  PCP: Loreli Elsie JONETTA Mickey., MD  REFERRING PROVIDER: Evonnie Asberry RAMAN, DO  END OF SESSION:  OT End of Session - 01/03/24 1321     Visit Number 12    Number of Visits 17    Date for Recertification  01/21/24    Authorization Type [employer]    OT Start Time 1319    OT Stop Time 1357    OT Time Calculation (min) 38 min    Equipment Utilized During Treatment coins, towel, nuts and bolts    Activity Tolerance Patient tolerated treatment well    Behavior During Therapy WFL for tasks assessed/performed           Past Medical History:  Diagnosis Date   Allergic rhinitis    Anxiety    from chronic pain from surgery- on Cymbalta    Arthritis    Asthma    Barrett's esophagus 03/29/2014   Carotid artery disease    Carotid Doppler normal August, 2007   Coronary artery disease    Diverticulosis    Dyslipidemia    GERD (gastroesophageal reflux disease)    History of loop recorder    has since 04/04/15   HTN (hypertension)    takes Metoprolol  for PVC control   Hx of colonic polyps    adenomatous   IFG (impaired fasting glucose)    Myocardial infarction (HCC)    mild   Neuromuscular disorder (HCC)    parkinson's tremors in hands   Palpitations    Benign PVCs   Parkinson disease (HCC)    Pneumonia    Prostate cancer (HCC)    RBBB (right bundle branch block)    rate related   Shingles    Stroke [facility]) 2017   January 01, 2023,right hand weakness,   TIA (transient ischemic attack) 02/2015   Per pt, had 2 strokes   Vertigo    Past Surgical History:  Procedure Laterality Date   BACK SURGERY  2002,2009   x 6   CHOLECYSTECTOMY  11/30/2012   with IOC   COLONOSCOPY     ELECTROPHYSIOLOGIC STUDY N/A 09/26/2015   Procedure: V Tach Ablation (PVC);  Surgeon: Will Gladis Norton, MD;  Location: [facility];  Service: Cardiovascular;  Laterality: N/A;   EP IMPLANTABLE DEVICE N/A 04/04/2015   Procedure: Loop Recorder Insertion;  Surgeon: Will Gladis Norton, MD;  Location: [facility];  Service: Cardiovascular;  Laterality: N/A;   HERNIA REPAIR     laprascopic   KNEE SURGERY     DECEMBER 2017,LEFT KNEE SCOPED   LEFT HEART CATH AND CORONARY ANGIOGRAPHY N/A 02/05/2021   Procedure: LEFT HEART CATH AND CORONARY ANGIOGRAPHY;  Surgeon: Darron Deatrice LABOR, MD;  Location: [facility];  Service: Cardiovascular;  Laterality: N/A;   NECK SURGERY  2002   POLYPECTOMY     ROTATOR CUFF REPAIR Left    TEE WITHOUT CARDIOVERSION N/A 04/04/2015   Procedure: TRANSESOPHAGEAL ECHOCARDIOGRAM (TEE);  Surgeon: Redell RAMAN Shallow, MD;  Location: [facility] ENDOSCOPY;  Service: Cardiovascular;  Laterality: N/A;   TOTAL KNEE ARTHROPLASTY Left 09/28/2022   Procedure: TOTAL KNEE ARTHROPLASTY;  Surgeon: Edna Toribio LABOR, MD;  Location: [facility] ORS;  Service: Orthopedics;  Laterality: Left;   TOTAL KNEE ARTHROPLASTY Right 01/11/2023   Procedure: TOTAL KNEE ARTHROPLASTY;  Surgeon: Edna Toribio LABOR, MD;  Location: [facility] OR[facility];  Service: Orthopedics;  Laterality: Right;   TRIGGER FINGER RELEASE Left 12/28/2019   Procedure: RELEASE TRIGGER FINGER/A-1 PULLEY THUMB, MIDDLE AND RING;  Surgeon: Murrell Kuba, MD;  Location: [facility];  Service: Orthopedics;  Laterality: Left;  FAB   UPPER GASTROINTESTINAL ENDOSCOPY     V Tach ablation  09/26/2015   Patient Active Problem List   Diagnosis Date Noted   CVA (cerebral vascular accident) (HCC) 01/15/2023   Primary osteoarthritis of right knee 01/11/2023   Localized osteoarthritis of right knee 01/11/2023   Primary osteoarthritis of left knee 09/28/2022   SBO (small bowel obstruction) (HCC) 05/29/2022   Chest pain 05/26/2022   Epigastric abdominal pain 05/26/2022   Coronary artery disease involving native coronary artery of native heart without angina pectoris  05/28/2021   History of myocardial infarction 02/05/2021   Parkinson's disease (HCC)    Malignant neoplasm of prostate (HCC) 11/01/2017   Essential tremor 12/06/2015   PVC (premature ventricular contraction) 09/26/2015   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) 04/07/2015   Acute CVA (cerebrovascular accident) (HCC) 03/06/2015   Stroke (cerebrum) (HCC) 03/05/2015   Cerebral infarction (HCC) 03/05/2015   Weakness of right upper extremity    Tremor    Ejection fraction    Vertigo    Sleep apnea    Palpitations    Hyperlipidemia with target LDL less than 70    GERD (gastroesophageal reflux disease)    HTN (hypertension)    Chest pain    RBBB (right bundle branch block)    Ejection fraction    Carotid artery disease (HCC)    ONSET DATE: 11/03/2023 (Date of referral)  REFERRING DIAG: G20.B1 (ICD-10-CM) - Parkinson's disease with dyskinesia, without mention of fluctuations   THERAPY DIAG:  Muscle weakness (generalized)  Essential tremor  Other symptoms and signs involving the musculoskeletal system  Other lack of coordination  Rationale for Evaluation and Treatment: Rehabilitation  SUBJECTIVE:   SUBJECTIVE STATEMENT: I've been doing the coordination activiites you've given me, they're going well. Sometimes I will redo my grip on the tissue and it goes better.  Pt accompanied by: self  PERTINENT HISTORY: Rt TKA 01/11/23 with subsequent acute Lt CVA affecting Rt side, PD, Anxiety, HTN, HLD, multiple back surgeries/fusion neck and lumbar spine,  NSTEMI, 01/2020, hx of multiple syncopal episodes, CVA in 2017   PRECAUTIONS: Fall  WEIGHT BEARING RESTRICTIONS: No  PAIN:  Are you having pain? Yes: NPRS scale: 7/10 Pain location: back Pain description: pulled Aggravating factors: exercises Relieving factors: rest, medications  FALLS: Has patient fallen in last 6 months? Yes. Number of falls 3  LIVING ENVIRONMENT: Lives with: lives with their  spouse Lives in: House/apartment Stairs: Yes: External: 1 steps; none Has following equipment at home: Single point cane, Walker - 2 wheeled, Environmental Consultant - 4 wheeled, shower chair, Grab bars, and [employer] and transport chair  PLOF: Independent; horticulturist, commercial; driving   PATIENT GOALS: To improve dishes, folding laundry, and   OBJECTIVE:  Note: Objective measures were completed at Evaluation unless otherwise noted.  HAND DOMINANCE: Right  ADLs: Overall ADLs: mod I  Eating: setup for cutting salad  Equipment: hower seat with back, Grab bars, Walk in shower, Reacher, Sock aid, Long handled shoe horn, and Long handled sponge  IADLs: Shopping: supervision Light housekeeping: mod I Meal Prep: mod I Community mobility: driving mod I Medication management: mod I Financial management: 50/50 with wife Handwriting: 25% legible or less with pen vs pencil  MOBILITY STATUS: Needs Assist: uses  SPC and Hx of falls   POSTURE COMMENTS:  rounded shoulders, forward head, increased thoracic kyphosis, posterior pelvic tilt, and weight shift right   ACTIVITY TOLERANCE: Activity tolerance: good to fair  FUNCTIONAL OUTCOME MEASURES: 11/12/2023: Fastening/unfastening 3 buttons: only able to button 2 buttons in 150 seconds  11/16/2023: using button hook for buttoning and unbuttoning, pt able to complete in 102 seconds 11/26/2023: 72 sec following repeat education 12/24/23: 70 seconds   Physical performance test: PPT#2 (simulated eating) Right: 25 seconds; Left: 24 sec    PPT#4 (donning/doffing jacket): TBD  Baseline: 30 seconds 12/24/23: 20 seconds  Goal status: GOAL MET  HAND FUNCTION: Grip strength: Right: 28.2 lbs  COORDINATION: 9 Hole Peg test: Right: 168 sec; Left: 57 sec Box and Blocks:  Right 25 blocks, Left 43 blocks   9HPT: 12/24/23: Right: Able to put in 4 in 3 minutes, Left: 43.45 seconds  Box and Blocks: 12/24/23: Right: 31 blocks, Left 43   UE ROM:  WFL;  chronic wrist injury with limited flexion and no ext.  UE MMT:   WFL  SENSATION: Moderate to severe paresthesias in R hand and fingers primarily but extends up to shoulder, worse since stroke   MUSCLE TONE: RUE: Rigidity; mild   COGNITION: Overall cognitive status: Within functional limits for tasks assessed  OBSERVATIONS: Bradykinesia and Postural tremors                                                                                                                   TREATMENT : 01/03/24 - Self-care/home management completed for duration as noted below including: Reviewing memory compensation strategies to assist in keeping STM sharp for as long as possible. See Pt instructions for handout provided.  - Therapeutic activities completed for duration as noted below including: Instructed pt in golf solitaire to improve B coordination, promote sharp thinking and problem solving, and visual tracking. Pt required mod vc for following directions, but participated well.   PATIENT EDUCATION: Education details: see above Person educated: Patient Education method: Explanation, Demonstration, Tactile cues, and Verbal cues Education comprehension: verbalized understanding, returned demonstration, verbal cues required, tactile cues required, and needs further education  HOME EXERCISE PROGRAM: 11/12/2023: buttoning 11/16/2023: Big ADLs; coordination; PWR! Hands 11/26/2023: Stocking donning 12/06/23: tremor strategies, ex's for neck and posture 12/28/23: bag exercises 12/30/23: additional coordination exercise 01/03/24: golf solitaire GOALS:  SHORT TERM GOALS: Target date: 01/21/2024    Pt will be independent with PD specific HEP.  Baseline: not yet initiated Goal status: IN PROGRESS  2.  Pt will verbalize understanding of adapted strategies to maximize safety and independence with ADLs/IADLs.  Baseline: not yet initiated Goal status: IN PROGRESS  3.  Pt will write a sentence with no  significant decrease in size and maintain 50% legibility.  Baseline: <25% legibility 12/08/2023: no significant increase to size, use of weighted pen, and >50% legibility Goal status: MET  4.  Pt will demonstrate improved fine motor coordination for ADLs as evidenced by decreasing 9 hole peg test  score for each hand by at least 8 seconds.  Baseline: Right: 168 sec; Left: 57 sec 12/24/23: Right: Able to put in 4 in 3 minutes, Left: 43.45 seconds Goal status: IN PROGRESS  5.  Pt will demonstrate improved ease with fastening buttons as evidenced by improving 3 button/unbutton time to be able to complete full assessment in 85 seconds or less.  Baseline: only able to button 2 buttons in 150 seconds - pt reported unable to feel his fingertips at time of eval 11/16/2023: using button hook for buttoning and unbuttoning, pt able to complete in 102 seconds 11/26/2023: 72 sec following repeat education 12/24/23: 70 seconds  Goal status: GOAL MET  LONG TERM GOALS: Target date: 01/21/2024    1.  Pt will verbalize understanding of ways to prevent future PD related complications and PD community resources.  Baseline: not yet initiated Goal status: IN PROGRESS  2.  Pt will write a short paragraph with no significant decrease in size and maintain 75% legibility.  Baseline: <25% legibility 12/24/23: with increased time, 50% legible 12/28/23: 60% legibility with increased time Goal status: IN PROGRESS  3.  Pt will verbalize understanding of ways to keep thinking skills sharp and ways to compensate for STM changes in the future.  Baseline: not yet initiated Goal status: IN PROGRESS  4.  Pt will demonstrate improved ease with feeding as evidenced by decreasing PPT#2 by at least 4 seconds.  Baseline: Right: 25 seconds; Left: 24 sec  12/24/23: Right: 31 seconds with standard spoon, 35 seconds weighted spoon (dropped one),   Left: 27 seconds weighted spoon, 14 seconds standard spoon Goal status: IN  PROGRESS  5.  Pt will demonstrate increased ease with dressing as evidenced by decreasing PPT#4 (don/ doff jacket) to 20 secs or less.  Baseline: 30 seconds 12/24/23: 20 seconds  Goal status: GOAL MET  6.  Pt will demonstrate improved fine motor coordination for ADLs as evidenced by decreasing 9 hole peg test score for each hand by at least 12 secs  Baseline: Right: 168 sec; Left: 57 sec 12/24/23: Right: Able to put in 4 in 3 minutes, Left: 43.45 seconds Goal status: IN PROGRESS  7.  Pt will be able to place at least 8 blocks using R hand with completion of Box and Blocks test.  Baseline:  Right 25 blocks, Left 43 blocks 12/24/23: Right: 31 blocks, Left 43,  Goal status: IN PROGRESS  ASSESSMENT:  CLINICAL IMPRESSION: Pt this date seen for occupational therapy tx for PD mgmt. Pt required min to mod cues for new task of golf solitaire but willing to participate. Pt would benefit from continued skilled services to improve quality of life and improved mgmt of PD.   PERFORMANCE DEFICITS: in functional skills including ADLs, IADLs, coordination, dexterity, strength, Fine motor control, Gross motor control, mobility, decreased knowledge of precautions, decreased knowledge of use of DME, and UE functional use.   IMPAIRMENTS: are limiting patient from ADLs, IADLs, leisure, and social participation.   COMORBIDITIES:  may have co-morbidities  that affects occupational performance. Patient will benefit from skilled OT to address above impairments and improve overall function.  REHAB POTENTIAL: Good  PLAN:  OT FREQUENCY: 1-2x/week  OT DURATION: 8 weeks  PLANNED INTERVENTIONS: 97168 OT Re-evaluation, 97535 self care/ADL training, [id number] therapeutic exercise, 97530 therapeutic activity, 97112 neuromuscular re-education, 97750 Physical Performance Testing, functional mobility training, coping strategies training, patient/family education, and DME and/or AE instructions  RECOMMENDED OTHER  SERVICES: N/A for this visit  CONSULTED AND  AGREED WITH PLAN OF CARE: Patient  PLAN FOR NEXT SESSION:  F/u golf solitaire prn Progress STGs/LTGs      Rocky Dutch, OT 01/03/2024, 1:59 PM   "

## 2024-01-03 NOTE — Therapy (Signed)
 " OUTPATIENT SPEECH LANGUAGE PATHOLOGY [facility]   Patient Name: Joshua [name] MRN: [id number] DOB:[birth year], 78 y.o., male Today's Date: 01/03/2024  PCP: Elsie Gentry, MD REFERRING PROVIDER: Elsie Gentry, MD  END OF SESSION:  End of Session - 01/03/24 1241     Visit Number 2    Number of Visits 17    Date for Recertification  02/02/24    SLP Start Time 1242   arrived late   SLP Stop Time  1315    SLP Time Calculation (min) 33 min    Activity Tolerance Patient tolerated treatment well          Past Medical History:  Diagnosis Date   Allergic rhinitis    Anxiety    from chronic pain from surgery- on Cymbalta    Arthritis    Asthma    Barrett's esophagus 03/29/2014   Carotid artery disease    Carotid Doppler normal August, 2007   Coronary artery disease    Diverticulosis    Dyslipidemia    GERD (gastroesophageal reflux disease)    History of loop recorder    has since 04/04/15   HTN (hypertension)    takes Metoprolol  for PVC control   Hx of colonic polyps    adenomatous   IFG (impaired fasting glucose)    Myocardial infarction (HCC)    mild   Neuromuscular disorder (HCC)    parkinson's tremors in hands   Palpitations    Benign PVCs   Parkinson disease (HCC)    Pneumonia    Prostate cancer (HCC)    RBBB (right bundle branch block)    rate related   Shingles    Stroke [facility]) 2017   January 01, 2023,right hand weakness,   TIA (transient ischemic attack) 02/2015   Per pt, had 2 strokes   Vertigo    Past Surgical History:  Procedure Laterality Date   BACK SURGERY  2002,2009   x 6   CHOLECYSTECTOMY  11/30/2012   with IOC   COLONOSCOPY     ELECTROPHYSIOLOGIC STUDY N/A 09/26/2015   Procedure: V Tach Ablation (PVC);  Surgeon: Will Gladis Norton, MD;  Location: [facility];  Service: Cardiovascular;  Laterality: N/A;   EP IMPLANTABLE DEVICE N/A 04/04/2015   Procedure: Loop Recorder Insertion;  Surgeon: Will Gladis Norton, MD;   Location: [facility];  Service: Cardiovascular;  Laterality: N/A;   HERNIA REPAIR     laprascopic   KNEE SURGERY     DECEMBER 2017,LEFT KNEE SCOPED   LEFT HEART CATH AND CORONARY ANGIOGRAPHY N/A 02/05/2021   Procedure: LEFT HEART CATH AND CORONARY ANGIOGRAPHY;  Surgeon: Darron Deatrice LABOR, MD;  Location: [facility];  Service: Cardiovascular;  Laterality: N/A;   NECK SURGERY  2002   POLYPECTOMY     ROTATOR CUFF REPAIR Left    TEE WITHOUT CARDIOVERSION N/A 04/04/2015   Procedure: TRANSESOPHAGEAL ECHOCARDIOGRAM (TEE);  Surgeon: Redell GORMAN Shallow, MD;  Location: [facility];  Service: Cardiovascular;  Laterality: N/A;   TOTAL KNEE ARTHROPLASTY Left 09/28/2022   Procedure: TOTAL KNEE ARTHROPLASTY;  Surgeon: Edna Toribio LABOR, MD;  Location: [facility];  Service: Orthopedics;  Laterality: Left;   TOTAL KNEE ARTHROPLASTY Right 01/11/2023   Procedure: TOTAL KNEE ARTHROPLASTY;  Surgeon: Edna Toribio LABOR, MD;  Location: [facility];  Service: Orthopedics;  Laterality: Right;   TRIGGER FINGER RELEASE Left 12/28/2019   Procedure: RELEASE TRIGGER FINGER/A-1 PULLEY THUMB, MIDDLE AND RING;  Surgeon: Murrell Kuba, MD;  Location: [facility];  Service: Orthopedics;  Laterality: Left;  FAB   UPPER GASTROINTESTINAL ENDOSCOPY     V Tach ablation  09/26/2015   Patient Active Problem List   Diagnosis Date Noted   CVA (cerebral vascular accident) (HCC) 01/15/2023   Primary osteoarthritis of right knee 01/11/2023   Localized osteoarthritis of right knee 01/11/2023   Primary osteoarthritis of left knee 09/28/2022   SBO (small bowel obstruction) (HCC) 05/29/2022   Chest pain 05/26/2022   Epigastric abdominal pain 05/26/2022   Coronary artery disease involving native coronary artery of native heart without angina pectoris 05/28/2021   History of myocardial infarction 02/05/2021   Parkinson's disease (HCC)    Malignant neoplasm of prostate (HCC) 11/01/2017   Essential tremor  12/06/2015   PVC (premature ventricular contraction) 09/26/2015   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) 04/07/2015   Acute CVA (cerebrovascular accident) (HCC) 03/06/2015   Stroke (cerebrum) (HCC) 03/05/2015   Cerebral infarction (HCC) 03/05/2015   Weakness of right upper extremity    Tremor    Ejection fraction    Vertigo    Sleep apnea    Palpitations    Hyperlipidemia with target LDL less than 70    GERD (gastroesophageal reflux disease)    HTN (hypertension)    Chest pain    RBBB (right bundle branch block)    Ejection fraction    Carotid artery disease (HCC)     ONSET DATE: 11/03/2023 referral   REFERRING DIAG: G20.B1 (ICD-10-CM) - Parkinson's disease with dyskinesia, without mention of fluctuations   THERAPY DIAG:  Dysarthria and anarthria  Aphasia  Rationale for Evaluation and Treatment: Rehabilitation  SUBJECTIVE:   SUBJECTIVE STATEMENT: I get tongue tied and can't get my words out  Pt accompanied by: self  PERTINENT HISTORY: Rt TKA 01/11/23 with subsequent acute Lt CVA affecting Rt side, PD, Anxiety, HTN, HLD, multiple back surgeries/fusion neck and lumbar spine,  NSTEMI, 01/2020, hx of multiple syncopal episodes, CVA in 2017. Known to ST service from prior episodes addressing dysarthria.   PAIN:  Are you having pain? Yes: NPRS scale: 6-7 Pain location: R hand, back, neck Pain description: chronic pain Aggravating factors: stress Relieving factors: sitting quietly  LIVING ENVIRONMENT: Lives with: lives with their spouse, [name]  Lives in: House/apartment   PATIENT GOALS: to prevent decline, work on word finding     PATIENT REPORTED OUTCOME MEASURES (PROM): The Communicative Participation Item Bank        Does your condition interfere with... Pt Rating   ...talking with people you know 1   ...communicating when you need to say something quickly 1   ...talking with people you do not know 0   ...communicating when you are out  in your community 2   ...asking questions in a conversation 1   ....communicating in a small group of people 1   ...having a long conversation 1   ...giving detailed infomrmation 1   ...getting your turn in a fast moving conversation 1   ...trying to persuade a friend or family member to see a different point of view 3  TOTAL 12  3= Not at all; 2=A little; 1=Quite a bit; 0=Very much  TREATMENT DATE:   Pt has quit singing in his choir due to not being able to stand still and read the music. Are their any modifications he can use to participate? Would it be feasible to sit and sing?   12/22/25BETHA Kava enters with hoarse rough voice. Generated strategies to support processing, including self advocating, using hand or index finger up to indicate you need a minute to think or to get a turn in conversation, limiting background noise and distractions, educate others that you need more time to respond - See Patient Instructions.  Targeted voice quality intelligibility using Speak Out! Lesson E :Library,  A Year of Intent workbook, Week of December 1. Pt required usual  mod verbal cues, modeling, to carry over intent and to complete each exercise accurately, slow and prolonged when required.  Pt averages the following volume levels:  Sustained AH: 82dB  Counting:85dB  Reading (phrases): 82dB  Cognitive Exercise: 74dB Required usual mod verbal cues to ID and correct hoarse voice by improving intent.   01/03/2024: Education provided on evaluation results and SLP's recommendations. Initiated training re: self-advocacy for communication, asking for time, HEP recommendations. Pt verbalizes agreement with POC, all questions answered to satisfaction.    PATIENT EDUCATION: Education details: Tour Manager Person educated: Patient Education method: Explanation Education comprehension:  verbalized understanding  HOME EXERCISE PROGRAM: Speak Out daily   GOALS: Goals reviewed with patient? Yes  SHORT TERM GOALS: Target date: 01/05/2024  Pt will complete warm up exercises from Speak Out! with strong, clear vocal quality, exceeding target dB in all trials over 2 sessions  Baseline: Goal status: ONGOING  2.  Pt will meet or exceed target dB for paragraph level readings over 2 sessions with min-A Baseline:  Goal status: ONGOING  3.  Pt will complete moderately complex discourse level structured language tasks and utilize verbal compensations for anomia in 90% of opportunities  Baseline:  Goal status: ONGOING  4.  Pt will teach back anomia compensations/strategies with mod-I Baseline:  Goal status: ONGOING   LONG TERM GOALS: Target date: 02/02/2024  Pt will verbalize x5 HEP activities for word-finding by d/c Baseline:  Goal status: ONGOING  2.  Pt will report improvement via PROM by at least 2 points by d/c  Baseline:  Goal status: ONGOING  3.  Pt will listen to 5 minute video/audio and verbally summarize key details in 2/2 opportunities  Baseline:  Goal status: ONGOING  4.  Pt will utilize anomia strategies/compensations in unstructured conversation with min-A Baseline:  Goal status: ONGOING  ASSESSMENT:  CLINICAL IMPRESSION: Patient is a 78 y.o. M who was seen today for motor speech and language evaluation in setting of PD and hx of CVA. Pt reports since stroke last year, he has been having trouble with word finding, significantly impacting his confidence in communication across communication partners (see PROM). During today's eval, pt's spoken expression is functional for simple conversation but challenges evidenced during tasks with increased complexity with pt demonstrating tangential speech without connecting key information to correctly answer questions (e.g., inference questions, asking for similarities). Auditory comprehension breakdowns evidenced  with complex syntax. Pt denies cognitive concerns, challenges swallowing. Desires to continue working on voice/speech in setting of PD. Volume is WNL today but voice is very hoarse/rough. Quality improved during sustained ee and ooh but not with cued intent for ah or reading. I recommend skilled ST to address verbal expression and motor speech.   OBJECTIVE IMPAIRMENTS: Objective impairments include expressive language and dysarthria. These impairments are  limiting patient from effectively communicating at home and in community.Factors affecting potential to achieve goals and functional outcome are co-morbidities.. Patient will benefit from skilled SLP services to address above impairments and improve overall function.  REHAB POTENTIAL: Good  PLAN:  SLP FREQUENCY: 2x/week  SLP DURATION: 8 weeks  PLANNED INTERVENTIONS: Cueing hierachy, Internal/external aids, Functional tasks, SLP instruction and feedback, Compensatory strategies, Patient/family education, [phone] Treatment of speech (30 or 45 min) , and 07476- Speech Eval Sound Prod, [place], Phon, Eval Compre, Express   Raymonda Pell, Leita Caldron, CCC-SLP 01/03/2024, 1:28 PM      "

## 2024-01-03 NOTE — Patient Instructions (Addendum)
" ° °  Processing conversation becomes slower with Parkinson's.   Educate others to not interrupt you - this may result in losing your train of thought and words  When you ask Joshua [name] a question, give him extra time to answer. Try not to ask another question as follow up until he answers.   When you give him multiple directions or requests at one time, make sure he is looking at you  and you have his complete attention  In group conversations, the topic changes quickly - it can be hard to keep up with the changes - give Joshua [name] a chance to participate. For example, Joshua [name], we are talking about football coaches. What do you think about Joshua [name] at [facility].  Joshua [name], you can stick up for yourself - hold up your finger or hand to indicate you want a turn or need more time to think  Get the Joshua [name]'s attention before you speak  Use eye contact and face the person you are speaking to  Be in close proximity to the person you are speaking to  Turn down any noise in the environment such as the TV, walk away from loud appliances, air conditioners, fans, dish washers etc  In large gatherings, sit or stay on the side not the center of the room  Try to sit with a wall behind you or in a corner so noise isn't coming at you from all directions when dining out or attending gatherings     "

## 2024-01-04 ENCOUNTER — Ambulatory Visit: Admitting: Occupational Therapy

## 2024-01-05 ENCOUNTER — Ambulatory Visit

## 2024-01-05 DIAGNOSIS — M6281 Muscle weakness (generalized): Secondary | ICD-10-CM | POA: Diagnosis not present

## 2024-01-05 DIAGNOSIS — R471 Dysarthria and anarthria: Secondary | ICD-10-CM

## 2024-01-05 DIAGNOSIS — R4701 Aphasia: Secondary | ICD-10-CM

## 2024-01-05 NOTE — Therapy (Signed)
 " OUTPATIENT SPEECH LANGUAGE PATHOLOGY [facility]   Patient Name: Joshua [name] MRN: [id number] DOB:Feb 11, 1945, 78 y.o., male Today's Date: 01/05/2024  PCP: Joshua Gentry, MD REFERRING PROVIDER: Elsie Gentry, MD  END OF SESSION:  End of Session - 01/05/24 1411     Visit Number 3    Number of Visits 17    Date for Recertification  02/02/24    SLP Start Time 1320    SLP Stop Time  1405    SLP Time Calculation (min) 45 min    Activity Tolerance Patient tolerated treatment well           Past Medical History:  Diagnosis Date   Allergic rhinitis    Anxiety    from chronic pain from surgery- on Cymbalta    Arthritis    Asthma    Barrett's esophagus 03/29/2014   Carotid artery disease    Carotid Doppler normal August, 2007   Coronary artery disease    Diverticulosis    Dyslipidemia    GERD (gastroesophageal reflux disease)    History of loop recorder    has since 04/04/15   HTN (hypertension)    takes Metoprolol  for PVC control   Hx of colonic polyps    adenomatous   IFG (impaired fasting glucose)    Myocardial infarction (HCC)    mild   Neuromuscular disorder (HCC)    parkinson's tremors in hands   Palpitations    Benign PVCs   Parkinson disease (HCC)    Pneumonia    Prostate cancer (HCC)    RBBB (right bundle branch block)    rate related   Shingles    Stroke [facility]) 2017   January 01, 2023,right hand weakness,   TIA (transient ischemic attack) 02/2015   Per pt, had 2 strokes   Vertigo    Past Surgical History:  Procedure Laterality Date   BACK SURGERY  2002,2009   x 6   CHOLECYSTECTOMY  11/30/2012   with IOC   COLONOSCOPY     ELECTROPHYSIOLOGIC STUDY N/A 09/26/2015   Procedure: V Tach Ablation (PVC);  Surgeon: Joshua Gladis Norton, MD;  Location: [facility];  Service: Cardiovascular;  Laterality: N/A;   EP IMPLANTABLE DEVICE N/A 04/04/2015   Procedure: Loop Recorder Insertion;  Surgeon: Joshua Gladis Norton, MD;  Location: [facility];  Service: Cardiovascular;  Laterality: N/A;   HERNIA REPAIR     laprascopic   KNEE SURGERY     DECEMBER 2017,LEFT KNEE SCOPED   LEFT HEART CATH AND CORONARY ANGIOGRAPHY N/A 02/05/2021   Procedure: LEFT HEART CATH AND CORONARY ANGIOGRAPHY;  Surgeon: Joshua Deatrice LABOR, MD;  Location: [facility];  Service: Cardiovascular;  Laterality: N/A;   NECK SURGERY  2002   POLYPECTOMY     ROTATOR CUFF REPAIR Left    TEE WITHOUT CARDIOVERSION N/A 04/04/2015   Procedure: TRANSESOPHAGEAL ECHOCARDIOGRAM (TEE);  Surgeon: Joshua GORMAN Shallow, MD;  Location: [facility] ENDOSCOPY;  Service: Cardiovascular;  Laterality: N/A;   TOTAL KNEE ARTHROPLASTY Left 09/28/2022   Procedure: TOTAL KNEE ARTHROPLASTY;  Surgeon: Joshua Toribio LABOR, MD;  Location: [facility];  Service: Orthopedics;  Laterality: Left;   TOTAL KNEE ARTHROPLASTY Right 01/11/2023   Procedure: TOTAL KNEE ARTHROPLASTY;  Surgeon: Joshua Toribio LABOR, MD;  Location: [facility];  Service: Orthopedics;  Laterality: Right;   TRIGGER FINGER RELEASE Left 12/28/2019   Procedure: RELEASE TRIGGER FINGER/A-1 PULLEY THUMB, MIDDLE AND RING;  Surgeon: Joshua Kuba, MD;  Location: [facility];  Service: Orthopedics;  Laterality: Left;  FAB   UPPER GASTROINTESTINAL ENDOSCOPY     V Tach ablation  09/26/2015   Patient Active Problem List   Diagnosis Date Noted   CVA (cerebral vascular accident) (HCC) 01/15/2023   Primary osteoarthritis of right knee 01/11/2023   Localized osteoarthritis of right knee 01/11/2023   Primary osteoarthritis of left knee 09/28/2022   SBO (small bowel obstruction) (HCC) 05/29/2022   Chest pain 05/26/2022   Epigastric abdominal pain 05/26/2022   Coronary artery disease involving native coronary artery of native heart without angina pectoris 05/28/2021   History of myocardial infarction 02/05/2021   Parkinson's disease (HCC)    Malignant neoplasm of prostate (HCC) 11/01/2017   Essential tremor 12/06/2015   PVC  (premature ventricular contraction) 09/26/2015   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) 04/07/2015   Acute CVA (cerebrovascular accident) (HCC) 03/06/2015   Stroke (cerebrum) (HCC) 03/05/2015   Cerebral infarction (HCC) 03/05/2015   Weakness of right upper extremity    Tremor    Ejection fraction    Vertigo    Sleep apnea    Palpitations    Hyperlipidemia with target LDL less than 70    GERD (gastroesophageal reflux disease)    HTN (hypertension)    Chest pain    RBBB (right bundle branch block)    Ejection fraction    Carotid artery disease (HCC)     ONSET DATE: 11/03/2023 referral   REFERRING DIAG: G20.B1 (ICD-10-CM) - Parkinson's disease with dyskinesia, without mention of fluctuations   THERAPY DIAG:  Dysarthria and anarthria  Aphasia  Rationale for Evaluation and Treatment: Rehabilitation  SUBJECTIVE:   SUBJECTIVE STATEMENT: I get tongue tied and can't get my words out  Pt accompanied by: self  PERTINENT HISTORY: Rt TKA 01/11/23 with subsequent acute Lt CVA affecting Rt side, PD, Anxiety, HTN, HLD, multiple back surgeries/fusion neck and lumbar spine,  NSTEMI, 01/2020, hx of multiple syncopal episodes, CVA in 2017. Known to ST service from prior episodes addressing dysarthria.   PAIN:  Are you having pain? Yes: NPRS scale: 6-7 Pain location: R hand, back, neck Pain description: chronic pain Aggravating factors: stress Relieving factors: sitting quietly  LIVING ENVIRONMENT: Lives with: lives with their spouse, Joshua [name]  Lives in: House/apartment   PATIENT GOALS: to prevent decline, work on word finding     PATIENT REPORTED OUTCOME MEASURES (PROM): The Communicative Participation Item Bank        Does your condition interfere with... Pt Rating   ...talking with people you know 1   ...communicating when you need to say something quickly 1   ...talking with people you do not know 0   ...communicating when you are out in your community 2    ...asking questions in a conversation 1   ....communicating in a small group of people 1   ...having a long conversation 1   ...giving detailed infomrmation 1   ...getting your turn in a fast moving conversation 1   ...trying to persuade a friend or family member to see a different point of view 3  TOTAL 12  3= Not at all; 2=A little; 1=Quite a bit; 0=Very much  TREATMENT DATE:   Pt has quit singing in his choir due to not being able to stand still and read the music. Are their any modifications he can use to participate? Would it be feasible to sit and sing? (Plan is t  01/05/24:  Targeted volume and intelligibility using Speak Out! Lesson 2. Pt required rare min verbal cues, modeling, for volume and breath support.   Pt averages the following volume levels:  Sustained AH: 86 dB  Counting: 85 dB  Reading (phrases): 80 dB  Cognitive Exercise: 78 dB Required rare min verbal cues, modeling for carryover of intent /volume answering simple questions following structured practice. SLP collaborated with pt about addressing accommodations for returning to his choir for singing. Pt notes that he continues to have difficulties with word-finding since his stroke. SLP educated pt and pt's wife about utilizing descriptions during moments of communication breakdown due to anomia. SLP instructed pt in singing with intent by having pt perform sustained phonation exercises. Pt achieved clear voicing for speech production in 85% of opportunities with sustained phonation exercises given occasional min A. Plan is to continue addressing communication support strategies for anomia and continue SPEAK OUT! Program.    01/03/24: Octaviano enters with hoarse rough voice. Generated strategies to support processing, including self advocating, using hand or index finger up to indicate you need a minute  to think or to get a turn in conversation, limiting background noise and distractions, educate others that you need more time to respond - See Patient Instructions.  Targeted voice quality intelligibility using Speak Out! Lesson E :Library,  A Year of Intent workbook, Week of December 1. Pt required usual  mod verbal cues, modeling, to carry over intent and to complete each exercise accurately, slow and prolonged when required.  Pt averages the following volume levels:  Sustained AH: 82dB  Counting:85dB  Reading (phrases): 82dB  Cognitive Exercise: 74dB Required usual mod verbal cues to ID and correct hoarse voice by improving intent.   01/05/2024: Education provided on evaluation results and SLP's recommendations. Initiated training re: self-advocacy for communication, asking for time, HEP recommendations. Pt verbalizes agreement with POC, all questions answered to satisfaction.    PATIENT EDUCATION: Education details: Tour Manager Person educated: Patient Education method: Explanation Education comprehension: verbalized understanding  HOME EXERCISE PROGRAM: Speak Out daily   GOALS: Goals reviewed with patient? Yes  SHORT TERM GOALS: Target date: 01/05/2024  Pt Joshua complete warm up exercises from Speak Out! with strong, clear vocal quality, exceeding target dB in all trials over 2 sessions  Baseline: Goal status: ONGOING  2.  Pt Joshua meet or exceed target dB for paragraph level readings over 2 sessions with min-A Baseline:  Goal status: ONGOING  3.  Pt Joshua complete moderately complex discourse level structured language tasks and utilize verbal compensations for anomia in 90% of opportunities  Baseline:  Goal status: ONGOING  4.  Pt Joshua teach back anomia compensations/strategies with mod-I Baseline:  Goal status: ONGOING   LONG TERM GOALS: Target date: 02/02/2024  Pt Joshua verbalize x5 HEP activities for word-finding by d/c Baseline:  Goal status: ONGOING  2.  Pt Joshua  report improvement via PROM by at least 2 points by d/c  Baseline:  Goal status: ONGOING  3.  Pt Joshua listen to 5 minute video/audio and verbally summarize key details in 2/2 opportunities  Baseline:  Goal status: ONGOING  4.  Pt Joshua utilize anomia strategies/compensations in unstructured conversation with min-A Baseline:  Goal status: ONGOING  ASSESSMENT:  CLINICAL IMPRESSION: Patient is a 78 y.o. M who was seen today for motor speech and language evaluation in setting of PD and hx of CVA. Pt reports since stroke last year, he has been having trouble with word finding, significantly impacting his confidence in communication across communication partners (see PROM). During today's eval, pt's spoken expression is functional for simple conversation but challenges evidenced during tasks with increased complexity with pt demonstrating tangential speech without connecting key information to correctly answer questions (e.g., inference questions, asking for similarities). Auditory comprehension breakdowns evidenced with complex syntax. Pt denies cognitive concerns, challenges swallowing. Desires to continue working on voice/speech in setting of PD. Volume is WNL today but voice is very hoarse/rough. Quality improved during sustained ee and ooh but not with cued intent for ah or reading. I recommend skilled ST to address verbal expression and motor speech.   OBJECTIVE IMPAIRMENTS: Objective impairments include expressive language and dysarthria. These impairments are limiting patient from effectively communicating at home and in community.Factors affecting potential to achieve goals and functional outcome are co-morbidities.. Patient Joshua benefit from skilled SLP services to address above impairments and improve overall function.  REHAB POTENTIAL: Good  PLAN:  SLP FREQUENCY: 2x/week  SLP DURATION: 8 weeks  PLANNED INTERVENTIONS: Cueing hierachy, Internal/external aids, Functional tasks,  SLP instruction and feedback, Compensatory strategies, Patient/family education, [phone] Treatment of speech (30 or 45 min) , and [id number]- Speech [address] [place], Phon, Eval Compre, Express   [employer], CF-SLP 01/05/2024, 2:11 PM      "

## 2024-01-07 ENCOUNTER — Ambulatory Visit

## 2024-01-10 ENCOUNTER — Encounter: Payer: Self-pay | Admitting: Occupational Therapy

## 2024-01-10 ENCOUNTER — Ambulatory Visit

## 2024-01-10 ENCOUNTER — Ambulatory Visit: Admitting: Occupational Therapy

## 2024-01-10 DIAGNOSIS — R278 Other lack of coordination: Secondary | ICD-10-CM

## 2024-01-10 DIAGNOSIS — R293 Abnormal posture: Secondary | ICD-10-CM

## 2024-01-10 DIAGNOSIS — R471 Dysarthria and anarthria: Secondary | ICD-10-CM

## 2024-01-10 DIAGNOSIS — R2681 Unsteadiness on feet: Secondary | ICD-10-CM

## 2024-01-10 DIAGNOSIS — R4701 Aphasia: Secondary | ICD-10-CM

## 2024-01-10 DIAGNOSIS — R29818 Other symptoms and signs involving the nervous system: Secondary | ICD-10-CM

## 2024-01-10 DIAGNOSIS — M6281 Muscle weakness (generalized): Secondary | ICD-10-CM | POA: Diagnosis not present

## 2024-01-10 NOTE — Therapy (Signed)
 " OUTPATIENT SPEECH LANGUAGE PATHOLOGY [facility]   Patient Name: Joshua [name] MRN: [id number] DOB:Jan 07, 1946, 78 y.o., male Today's Date: 01/10/2024  PCP: Elsie Gentry, MD REFERRING PROVIDER: Elsie Gentry, MD  END OF SESSION:  End of Session - 01/10/24 1016     Visit Number 4    Number of Visits 17    Date for Recertification  02/02/24    SLP Start Time 0941    SLP Stop Time  1016    SLP Time Calculation (min) 35 min    Activity Tolerance Patient tolerated treatment well            Past Medical History:  Diagnosis Date   Allergic rhinitis    Anxiety    from chronic pain from surgery- on Cymbalta    Arthritis    Asthma    Barrett's esophagus 03/29/2014   Carotid artery disease    Carotid Doppler normal August, 2007   Coronary artery disease    Diverticulosis    Dyslipidemia    GERD (gastroesophageal reflux disease)    History of loop recorder    has since 04/04/15   HTN (hypertension)    takes Metoprolol  for PVC control   Hx of colonic polyps    adenomatous   IFG (impaired fasting glucose)    Myocardial infarction (HCC)    mild   Neuromuscular disorder (HCC)    parkinson's tremors in hands   Palpitations    Benign PVCs   Parkinson disease (HCC)    Pneumonia    Prostate cancer (HCC)    RBBB (right bundle branch block)    rate related   Shingles    Stroke [facility]) 2017   January 01, 2023,right hand weakness,   TIA (transient ischemic attack) 02/2015   Per pt, had 2 strokes   Vertigo    Past Surgical History:  Procedure Laterality Date   BACK SURGERY  2002,2009   x 6   CHOLECYSTECTOMY  11/30/2012   with IOC   COLONOSCOPY     ELECTROPHYSIOLOGIC STUDY N/A 09/26/2015   Procedure: V Tach Ablation (PVC);  Surgeon: Will Gladis Norton, MD;  Location: [facility];  Service: Cardiovascular;  Laterality: N/A;   EP IMPLANTABLE DEVICE N/A 04/04/2015   Procedure: Loop Recorder Insertion;  Surgeon: Will Gladis Norton, MD;  Location:  [facility];  Service: Cardiovascular;  Laterality: N/A;   HERNIA REPAIR     laprascopic   KNEE SURGERY     DECEMBER 2017,LEFT KNEE SCOPED   LEFT HEART CATH AND CORONARY ANGIOGRAPHY N/A 02/05/2021   Procedure: LEFT HEART CATH AND CORONARY ANGIOGRAPHY;  Surgeon: Darron Deatrice LABOR, MD;  Location: [facility];  Service: Cardiovascular;  Laterality: N/A;   NECK SURGERY  2002   POLYPECTOMY     ROTATOR CUFF REPAIR Left    TEE WITHOUT CARDIOVERSION N/A 04/04/2015   Procedure: TRANSESOPHAGEAL ECHOCARDIOGRAM (TEE);  Surgeon: Redell GORMAN Shallow, MD;  Location: [facility] ENDOSCOPY;  Service: Cardiovascular;  Laterality: N/A;   TOTAL KNEE ARTHROPLASTY Left 09/28/2022   Procedure: TOTAL KNEE ARTHROPLASTY;  Surgeon: Edna Toribio LABOR, MD;  Location: [facility];  Service: Orthopedics;  Laterality: Left;   TOTAL KNEE ARTHROPLASTY Right 01/11/2023   Procedure: TOTAL KNEE ARTHROPLASTY;  Surgeon: Edna Toribio LABOR, MD;  Location: [facility];  Service: Orthopedics;  Laterality: Right;   TRIGGER FINGER RELEASE Left 12/28/2019   Procedure: RELEASE TRIGGER FINGER/A-1 PULLEY THUMB, MIDDLE AND RING;  Surgeon: Murrell Kuba, MD;  Location: [facility];  Service: Orthopedics;  Laterality: Left;  FAB   UPPER GASTROINTESTINAL ENDOSCOPY     V Tach ablation  09/26/2015   Patient Active Problem List   Diagnosis Date Noted   CVA (cerebral vascular accident) (HCC) 01/15/2023   Primary osteoarthritis of right knee 01/11/2023   Localized osteoarthritis of right knee 01/11/2023   Primary osteoarthritis of left knee 09/28/2022   SBO (small bowel obstruction) (HCC) 05/29/2022   Chest pain 05/26/2022   Epigastric abdominal pain 05/26/2022   Coronary artery disease involving native coronary artery of native heart without angina pectoris 05/28/2021   History of myocardial infarction 02/05/2021   Parkinson's disease (HCC)    Malignant neoplasm of prostate (HCC) 11/01/2017   Essential tremor 12/06/2015   PVC  (premature ventricular contraction) 09/26/2015   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) 04/07/2015   Acute CVA (cerebrovascular accident) (HCC) 03/06/2015   Stroke (cerebrum) (HCC) 03/05/2015   Cerebral infarction (HCC) 03/05/2015   Weakness of right upper extremity    Tremor    Ejection fraction    Vertigo    Sleep apnea    Palpitations    Hyperlipidemia with target LDL less than 70    GERD (gastroesophageal reflux disease)    HTN (hypertension)    Chest pain    RBBB (right bundle branch block)    Ejection fraction    Carotid artery disease (HCC)     ONSET DATE: 11/03/2023 referral   REFERRING DIAG: G20.B1 (ICD-10-CM) - Parkinson's disease with dyskinesia, without mention of fluctuations   THERAPY DIAG:  Dysarthria and anarthria  Aphasia  Rationale for Evaluation and Treatment: Rehabilitation  SUBJECTIVE:   SUBJECTIVE STATEMENT: I get tongue tied and can't get my words out  Pt accompanied by: self  PERTINENT HISTORY: Rt TKA 01/11/23 with subsequent acute Lt CVA affecting Rt side, PD, Anxiety, HTN, HLD, multiple back surgeries/fusion neck and lumbar spine,  NSTEMI, 01/2020, hx of multiple syncopal episodes, CVA in 2017. Known to ST service from prior episodes addressing dysarthria.   PAIN:  Are you having pain? Yes: NPRS scale: 6-7 Pain location: R hand, back, neck Pain description: chronic pain Aggravating factors: stress Relieving factors: sitting quietly  LIVING ENVIRONMENT: Lives with: lives with their spouse, Joshua [name]  Lives in: House/apartment   PATIENT GOALS: to prevent decline, work on word finding     PATIENT REPORTED OUTCOME MEASURES (PROM): [employer] Communicative Participation [employer]        Does your condition interfere with... Pt Rating   ...talking with people you know 1   ...communicating when you need to say something quickly 1   ...talking with people you do not know 0   ...communicating when you are out in your community 2    ...asking questions in a conversation 1   ....communicating in a small group of people 1   ...having a long conversation 1   ...giving detailed infomrmation 1   ...getting your turn in a fast moving conversation 1   ...trying to persuade a friend or family member to see a different point of view 3  TOTAL 12  3= Not at all; 2=A little; 1=Quite a bit; 0=Very much  TREATMENT DATE:   Pt has quit singing in his choir due to not being able to stand still and read the music. Are their any modifications he can use to participate? Would it be feasible to sit and sing? (Plan is t  01/10/24: Pt was late for session.  Targeted volume and intelligibility using Speak Out! Lesson 3. Pt required occasional min verbal cues, modeling, for volume and breath support.   Pt averages the following volume levels:  Sustained AH: 85 dB  Counting: 82 dB  Reading (phrases): 78 dB  Cognitive Exercise: 72 dB Required occasional min verbal cues, modeling for carryover of intent /volume answering simple questions following structured practice. SLP introduced Primary School Teacher (SFA) as an anomic strategy for pt's communication. SLP guided pt though utilizing SFA during structured exercises involving describing objects. Pt successfully described 2 objects using 6+ descriptions given occasional min A. SLP provided pt HEP involving SFA for continued practice. Plan is to continue SPEAK OUT! Program and anomia strategies.   01/05/24:  Targeted volume and intelligibility using Speak Out! Lesson 2. Pt required rare min verbal cues, modeling, for volume and breath support.   Pt averages the following volume levels:  Sustained AH: 86 dB  Counting: 85 dB  Reading (phrases): 80 dB  Cognitive Exercise: 78 dB Required rare min verbal cues, modeling for carryover of intent /volume answering simple  questions following structured practice. SLP collaborated with pt about addressing accommodations for returning to his choir for singing. Pt notes that he continues to have difficulties with word-finding since his stroke. SLP educated pt and pt's wife about utilizing descriptions during moments of communication breakdown due to anomia. SLP instructed pt in singing with intent by having pt perform sustained phonation exercises. Pt achieved clear voicing for speech production in 85% of opportunities with sustained phonation exercises given occasional min A. Plan is to continue addressing communication support strategies for anomia and continue SPEAK OUT! Program.    01/03/24: Octaviano enters with hoarse rough voice. Generated strategies to support processing, including self advocating, using hand or index finger up to indicate you need a minute to think or to get a turn in conversation, limiting background noise and distractions, educate others that you need more time to respond - See Patient Instructions.  Targeted voice quality intelligibility using Speak Out! Lesson E :Library,  A Year of Intent workbook, Week of December 1. Pt required usual  mod verbal cues, modeling, to carry over intent and to complete each exercise accurately, slow and prolonged when required.  Pt averages the following volume levels:  Sustained AH: 82dB  Counting:85dB  Reading (phrases): 82dB  Cognitive Exercise: 74dB Required usual mod verbal cues to ID and correct hoarse voice by improving intent.   01/10/2024: Education provided on evaluation results and SLP's recommendations. Initiated training re: self-advocacy for communication, asking for time, HEP recommendations. Pt verbalizes agreement with POC, all questions answered to satisfaction.    PATIENT EDUCATION: Education details: Tour Manager Person educated: Patient Education method: Explanation Education comprehension: verbalized understanding  HOME EXERCISE  PROGRAM: Speak Out daily   GOALS: Goals reviewed with patient? Yes  SHORT TERM GOALS: Target date: 01/05/2024  Pt will complete warm up exercises from Speak Out! with strong, clear vocal quality, exceeding target dB in all trials over 2 sessions  Baseline: Goal status: ONGOING  2.  Pt will meet or exceed target dB for paragraph level readings over 2 sessions with min-A Baseline:  Goal status: ONGOING  3.  Pt  will complete moderately complex discourse level structured language tasks and utilize verbal compensations for anomia in 90% of opportunities  Baseline:  Goal status: ONGOING  4.  Pt will teach back anomia compensations/strategies with mod-I Baseline:  Goal status: ONGOING   LONG TERM GOALS: Target date: 02/02/2024  Pt will verbalize x5 HEP activities for word-finding by d/c Baseline:  Goal status: ONGOING  2.  Pt will report improvement via PROM by at least 2 points by d/c  Baseline:  Goal status: ONGOING  3.  Pt will listen to 5 minute video/audio and verbally summarize key details in 2/2 opportunities  Baseline:  Goal status: ONGOING  4.  Pt will utilize anomia strategies/compensations in unstructured conversation with min-A Baseline:  Goal status: ONGOING  ASSESSMENT:  CLINICAL IMPRESSION: Patient is a 78 y.o. M who was seen today for motor speech and language evaluation in setting of PD and hx of CVA. Pt reports since stroke last year, he has been having trouble with word finding, significantly impacting his confidence in communication across communication partners (see PROM). During today's eval, pt's spoken expression is functional for simple conversation but challenges evidenced during tasks with increased complexity with pt demonstrating tangential speech without connecting key information to correctly answer questions (e.g., inference questions, asking for similarities). Auditory comprehension breakdowns evidenced with complex syntax. Pt denies cognitive  concerns, challenges swallowing. Desires to continue working on voice/speech in setting of PD. Volume is WNL today but voice is very hoarse/rough. Quality improved during sustained ee and ooh but not with cued intent for ah or reading. I recommend skilled ST to address verbal expression and motor speech.   OBJECTIVE IMPAIRMENTS: Objective impairments include expressive language and dysarthria. These impairments are limiting patient from effectively communicating at home and in community.Factors affecting potential to achieve goals and functional outcome are co-morbidities.. Patient will benefit from skilled SLP services to address above impairments and improve overall function.  REHAB POTENTIAL: Good  PLAN:  SLP FREQUENCY: 2x/week  SLP DURATION: 8 weeks  PLANNED INTERVENTIONS: Cueing hierachy, Internal/external aids, Functional tasks, SLP instruction and feedback, Compensatory strategies, Patient/family education, [phone] Treatment of speech (30 or 45 min) , and [id number]- Speech [address], [place], Phon, Eval Compre, Express   [employer], CF-SLP 01/10/2024, 10:17 AM      "

## 2024-01-10 NOTE — Therapy (Signed)
 " OUTPATIENT OCCUPATIONAL THERAPY [facility] TRE[facility] NOTE  Patient Name: Joshua [name] MRN: [id number] DOB:07-25-1945, 78 y.o., male Today's Date: 01/10/2024  PCP: Loreli Elsie JONETTA Mickey., MD  REFERRING PROVIDER: Evonnie Asberry RAMAN, DO  END OF SESSION:  OT End of Session - 01/10/24 1018     Visit Number 13    Number of Visits 17    Date for Recertification  01/21/24    Authorization Type [employer]    OT Start Time 1016    OT Stop Time 1100    OT Time Calculation (min) 44 min    Equipment Utilized During Treatment coins, towel, nuts and bolts    Activity Tolerance Patient tolerated treatment well    Behavior During Therapy WFL for tasks assessed/performed           Past Medical History:  Diagnosis Date   Allergic rhinitis    Anxiety    from chronic pain from surgery- on Cymbalta    Arthritis    Asthma    Barrett's esophagus 03/29/2014   Carotid artery disease    Carotid Doppler normal August, 2007   Coronary artery disease    Diverticulosis    Dyslipidemia    GERD (gastroesophageal reflux disease)    History of loop recorder    has since 04/04/15   HTN (hypertension)    takes Metoprolol  for PVC control   Hx of colonic polyps    adenomatous   IFG (impaired fasting glucose)    Myocardial infarction (HCC)    mild   Neuromuscular disorder (HCC)    parkinson's tremors in hands   Palpitations    Benign PVCs   Parkinson disease (HCC)    Pneumonia    Prostate cancer (HCC)    RBBB (right bundle branch block)    rate related   Shingles    Stroke [facility]) 2017   January 01, 2023,right hand weakness,   TIA (transient ischemic attack) 02/2015   Per pt, had 2 strokes   Vertigo    Past Surgical History:  Procedure Laterality Date   BACK SURGERY  2002,2009   x 6   CHOLECYSTECTOMY  11/30/2012   with IOC   COLONOSCOPY     ELECTROPHYSIOLOGIC STUDY N/A 09/26/2015   Procedure: V Tach Ablation (PVC);  Surgeon: Will Gladis Norton, MD;  Location: [facility];  Service: Cardiovascular;  Laterality: N/A;   EP IMPLANTABLE DEVICE N/A 04/04/2015   Procedure: Loop Recorder Insertion;  Surgeon: Will Gladis Norton, MD;  Location: [facility];  Service: Cardiovascular;  Laterality: N/A;   HERNIA REPAIR     laprascopic   KNEE SURGERY     DECEMBER 2017,LEFT KNEE SCOPED   LEFT HEART CATH AND CORONARY ANGIOGRAPHY N/A 02/05/2021   Procedure: LEFT HEART CATH AND CORONARY ANGIOGRAPHY;  Surgeon: Darron Deatrice LABOR, MD;  Location: [facility];  Service: Cardiovascular;  Laterality: N/A;   NECK SURGERY  2002   POLYPECTOMY     ROTATOR CUFF REPAIR Left    TEE WITHOUT CARDIOVERSION N/A 04/04/2015   Procedure: TRANSESOPHAGEAL ECHOCARDIOGRAM (TEE);  Surgeon: Redell RAMAN Shallow, MD;  [facility];  Service: Cardiovascular;  Laterality: N/A;   TOTAL KNEE ARTHROPLASTY Left 09/28/2022   Procedure: TOTAL KNEE ARTHROPLASTY;  Surgeon: Edna Toribio LABOR, MD;  Location: [facility] ORS;  Service: Orthopedics;  Laterality: Left;   TOTAL KNEE ARTHROPLASTY Right 01/11/2023   Procedure: TOTAL KNEE ARTHROPLASTY;  Surgeon: Edna Toribio LABOR, MD;  Location: [facility] OR[facility];  Service: Orthopedics;  Laterality: Right;   TRIGGER FINGER RELEASE Left 12/28/2019   Procedure: RELEASE TRIGGER FINGER/A-1 PULLEY THUMB, MIDDLE AND RING;  Surgeon: Murrell Kuba, MD;  Location: [facility];  Service: Orthopedics;  Laterality: Left;  FAB   UPPER GASTROINTESTINAL ENDOSCOPY     V Tach ablation  09/26/2015   Patient Active Problem List   Diagnosis Date Noted   CVA (cerebral vascular accident) (HCC) 01/15/2023   Primary osteoarthritis of right knee 01/11/2023   Localized osteoarthritis of right knee 01/11/2023   Primary osteoarthritis of left knee 09/28/2022   SBO (small bowel obstruction) (HCC) 05/29/2022   Chest pain 05/26/2022   Epigastric abdominal pain 05/26/2022   Coronary artery disease involving native coronary artery of native heart without angina pectoris  05/28/2021   History of myocardial infarction 02/05/2021   Parkinson's disease (HCC)    Malignant neoplasm of prostate (HCC) 11/01/2017   Essential tremor 12/06/2015   PVC (premature ventricular contraction) 09/26/2015   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) 04/07/2015   Acute CVA (cerebrovascular accident) (HCC) 03/06/2015   Stroke (cerebrum) (HCC) 03/05/2015   Cerebral infarction (HCC) 03/05/2015   Weakness of right upper extremity    Tremor    Ejection fraction    Vertigo    Sleep apnea    Palpitations    Hyperlipidemia with target LDL less than 70    GERD (gastroesophageal reflux disease)    HTN (hypertension)    Chest pain    RBBB (right bundle branch block)    Ejection fraction    Carotid artery disease (HCC)    ONSET DATE: 11/03/2023 (Date of referral)  REFERRING DIAG: G20.B1 (ICD-10-CM) - Parkinson's disease with dyskinesia, without mention of fluctuations   THERAPY DIAG:  Other lack of coordination  Unsteadiness on feet  Abnormal posture  Other symptoms and signs involving the nervous system  Rationale for Evaluation and Treatment: Rehabilitation  SUBJECTIVE:   SUBJECTIVE STATEMENT: I can now get some pills off the table if I'm patient  Pt accompanied by: self  PERTINENT HISTORY: Rt TKA 01/11/23 with subsequent acute Lt CVA affecting Rt side, PD, Anxiety, HTN, HLD, multiple back surgeries/fusion neck and lumbar spine,  NSTEMI, 01/2020, hx of multiple syncopal episodes, CVA in 2017   PRECAUTIONS: Fall  WEIGHT BEARING RESTRICTIONS: No  PAIN:  Are you having pain? Yes: NPRS scale: 7/10 Pain location: back Pain description: pulled Aggravating factors: exercises Relieving factors: rest, medications  FALLS: Has patient fallen in last 6 months? Yes. Number of falls 3  LIVING ENVIRONMENT: Lives with: lives with their spouse Lives in: House/apartment Stairs: Yes: External: 1 steps; none Has following equipment at home: Single point  cane, Walker - 2 wheeled, Environmental Consultant - 4 wheeled, shower chair, Grab bars, and [employer] and transport chair  PLOF: Independent; horticulturist, commercial; driving   PATIENT GOALS: To improve dishes, folding laundry, and   OBJECTIVE:  Note: Objective measures were completed at Evaluation unless otherwise noted.  HAND DOMINANCE: Right  ADLs: Overall ADLs: mod I  Eating: setup for cutting salad  Equipment: hower seat with back, Grab bars, Walk in shower, Reacher, Sock aid, Long handled shoe horn, and Long handled sponge  IADLs: Shopping: supervision Light housekeeping: mod I Meal Prep: mod I Community mobility: driving mod I Medication management: mod I Financial management: 50/50 with wife Handwriting: 25% legible or less with pen vs pencil  MOBILITY STATUS: Needs Assist: uses SPC and Hx of falls   POSTURE COMMENTS:  rounded shoulders, forward  head, increased thoracic kyphosis, posterior pelvic tilt, and weight shift right   ACTIVITY TOLERANCE: Activity tolerance: good to fair  FUNCTIONAL OUTCOME MEASURES: 11/12/2023: Fastening/unfastening 3 buttons: only able to button 2 buttons in 150 seconds  11/16/2023: using button hook for buttoning and unbuttoning, pt able to complete in 102 seconds 11/26/2023: 72 sec following repeat education 12/24/23: 70 seconds   Physical performance test: PPT#2 (simulated eating) Right: 25 seconds; Left: 24 sec    PPT#4 (donning/doffing jacket): TBD  Baseline: 30 seconds 12/24/23: 20 seconds  Goal status: GOAL MET  HAND FUNCTION: Grip strength: Right: 28.2 lbs  COORDINATION: 9 Hole Peg test: Right: 168 sec; Left: 57 sec Box and Blocks:  Right 25 blocks, Left 43 blocks   9HPT: 12/24/23: Right: Able to put in 4 in 3 minutes, Left: 43.45 seconds  Box and Blocks: 12/24/23: Right: 31 blocks, Left 43   UE ROM:  WFL; chronic wrist injury with limited flexion and no ext.  UE MMT:   WFL  SENSATION: Moderate to severe paresthesias in R  hand and fingers primarily but extends up to shoulder, worse since stroke   MUSCLE TONE: RUE: Rigidity; mild   COGNITION: Overall cognitive status: Within functional limits for tasks assessed  OBSERVATIONS: Bradykinesia and Postural tremors                                                                                                                    TREATMENT :   Reviewed strategies/modifications to PWR! Twist in standing and seated to prevent back pain Reviewed golf solitaire to improve B coordination, promote sharp thinking and problem solving, and visual tracking. Pt required min to mod vc for following directions, but participated well. Pt required moderate cueing throughout task to place cards in pile and not on top of cards displayed (as in regular solitaire), and for problem solving for most strategic move(s). Pt did demo some memory deficits with game even after review. Pt issued handouts on ways to prevent future PD related complications, memory compensation strategies, and ways to keep thinking skills sharp and reviewed each with patient  PATIENT EDUCATION: Education details: see above Person educated: Patient Education method: Explanation, Verbal cues, and Handouts Education comprehension: verbalized understanding, verbal cues required, and needs further education  HOME EXERCISE PROGRAM: 11/12/2023: buttoning 11/16/2023: Big ADLs; coordination; PWR! Hands 11/26/2023: Stocking donning 12/06/23: tremor strategies, ex's for neck and posture 12/28/23: bag exercises 12/30/23: additional coordination exercise 01/03/24: golf solitaire 01/10/24: ways to prevent future PD related complications, memory compensation strategies, and ways to keep thinking skills sharp  GOALS:  SHORT TERM GOALS: Target date: 01/21/2024    Pt will be independent with PD specific HEP.  Baseline: not yet initiated Goal status: MET   2.  Pt will verbalize understanding of adapted strategies to  maximize safety and independence with ADLs/IADLs.  Baseline: not yet initiated Goal status: IN PROGRESS  3.  Pt will write a sentence with no significant decrease in size and maintain 50% legibility.  Baseline: <  25% legibility 12/08/2023: no significant increase to size, use of weighted pen, and >50% legibility Goal status: MET  4.  Pt will demonstrate improved fine motor coordination for ADLs as evidenced by decreasing 9 hole peg test score for each hand by at least 8 seconds.  Baseline: Right: 168 sec; Left: 57 sec 12/24/23: Right: Able to put in 4 in 3 minutes, Left: 43.45 seconds Goal status: IN PROGRESS  5.  Pt will demonstrate improved ease with fastening buttons as evidenced by improving 3 button/unbutton time to be able to complete full assessment in 85 seconds or less.  Baseline: only able to button 2 buttons in 150 seconds - pt reported unable to feel his fingertips at time of eval 11/16/2023: using button hook for buttoning and unbuttoning, pt able to complete in 102 seconds 11/26/2023: 72 sec following repeat education 12/24/23: 70 seconds  Goal status: GOAL MET   LONG TERM GOALS: Target date: 01/21/2024    1.  Pt will verbalize understanding of ways to prevent future PD related complications and PD community resources.  Baseline: not yet initiated Goal status: IN PROGRESS  2.  Pt will write a short paragraph with no significant decrease in size and maintain 75% legibility.  Baseline: <25% legibility 12/24/23: with increased time, 50% legible 12/28/23: 60% legibility with increased time Goal status: IN PROGRESS  3.  Pt will verbalize understanding of ways to keep thinking skills sharp and ways to compensate for STM changes in the future.  Baseline: not yet initiated Goal status: MET   4.  Pt will demonstrate improved ease with feeding as evidenced by decreasing PPT#2 by at least 4 seconds.  Baseline: Right: 25 seconds; Left: 24 sec  12/24/23: Right: 31 seconds  with standard spoon, 35 seconds weighted spoon (dropped one),   Left: 27 seconds weighted spoon, 14 seconds standard spoon Goal status: IN PROGRESS  5.  Pt will demonstrate increased ease with dressing as evidenced by decreasing PPT#4 (don/ doff jacket) to 20 secs or less.  Baseline: 30 seconds 12/24/23: 20 seconds  Goal status: GOAL MET  6.  Pt will demonstrate improved fine motor coordination for ADLs as evidenced by decreasing 9 hole peg test score for each hand by at least 12 secs  Baseline: Right: 168 sec; Left: 57 sec 12/24/23: Right: Able to put in 4 in 3 minutes, Left: 43.45 seconds Goal status: IN PROGRESS  7.  Pt will be able to place at least 8 blocks using R hand with completion of Box and Blocks test.  Baseline:  Right 25 blocks, Left 43 blocks 12/24/23: Right: 31 blocks, Left 43,  Goal status: IN PROGRESS  ASSESSMENT:  CLINICAL IMPRESSION: Pt this date seen for occupational therapy tx for PD mgmt. Pt required min to mod cues for REVIEW of golf solitaire but willing to participate. Pt would benefit from continued skilled services to improve quality of life and improved mgmt of PD.   PERFORMANCE DEFICITS: in functional skills including ADLs, IADLs, coordination, dexterity, strength, Fine motor control, Gross motor control, mobility, decreased knowledge of precautions, decreased knowledge of use of DME, and UE functional use.   IMPAIRMENTS: are limiting patient from ADLs, IADLs, leisure, and social participation.   COMORBIDITIES:  may have co-morbidities  that affects occupational performance. Patient will benefit from skilled OT to address above impairments and improve overall function.  REHAB POTENTIAL: Good  PLAN:  OT FREQUENCY: 1-2x/week  OT DURATION: 8 weeks  PLANNED INTERVENTIONS: 02831 OT Re-evaluation, 97535 self care/ADL training,  97110 therapeutic exercise, 97530 therapeutic activity, 97112 neuromuscular re-education, 97750 Physical Performance Testing,  functional mobility training, coping strategies training, patient/family education, and DME and/or AE instructions  RECOMMENDED OTHER SERVICES: N/A for this visit  CONSULTED AND AGREED WITH PLAN OF CARE: Patient  PLAN FOR NEXT SESSION:  Progress STGs/LTGs      Burnard JINNY Roads, OT 01/10/2024, 10:18 AM   "

## 2024-01-10 NOTE — Patient Instructions (Addendum)
 Ways to prevent future Parkinson's related complications:  1.   Exercise regularly/daily. Challenge yourself SAFELY and try to get different types of exercise including: PWR! Moves, Cardio (cycling), strength and balance training, and stretches (Yoga)   2.   Focus on BIGGER movements during daily activities- really reach overhead, straighten elbows and extend fingers  3.   When dressing (especially jacket/coat) or reaching for your seatbelt make sure to use your body to assist by twisting while you reach and looking at where you are reaching - this can help to minimize stress on the shoulder and reduce the risk of a rotator cuff tear  4.   Swing your arms when you walk (unless using walker)! People with PD are at increased risk for frozen shoulder and swinging your arms can reduce this risk.  5. Drink plenty of water  and eat a high fiber diet (fresh fruits and veggies, nuts/seeds, beans, some fish). Avoid canned foods, reduce sugar intake and dairy when possible  6. Do NOT take your Parkinson's medication around meals (take at least 30 min before a meal, or 1 hour after a meal), especially protein as it blocks the absorption of the medicine and will not work effectively  7. Keep your feet apart when you are standing (wider stance) to allow you to have better balance and to reach further with your arms. Also make sure your feet are apart before standing up.   8. Stay social! - Go out with friends, play card games with others, join a community group or community PD exercise class  9. Get a good night's sleep! Staying active during the day and developing a good bedtime routine can help with this.   10. Communicate with your neurologist any concerns and maintain overall health - keep blood pressure regulated, reduce stress/anxiety, good nutrition/gut health, etc.           Memory Compensation Strategies  Use WARM strategy.  W= write it down  A= associate it  R= repeat it  M= make a  mental note  2.   You can keep a Glass Blower/designer.  Use a 3-ring notebook with sections for the following: calendar, important names and phone numbers,  medications, doctors' names/phone numbers, lists/reminders, and a section to journal what you did  each day.   3.    Use a calendar to write appointments down.  4.    Write yourself a schedule for the day.  This can be placed on the calendar or in a separate section of the Memory Notebook.  Keeping a  regular schedule can help memory.  5.    Use medication organizer with sections for each day or morning/evening pills.  You may need help loading it  6.    Keep a basket, or pegboard by the door.  Place items that you need to take out with you in the basket or on the pegboard.  You may also want to  include a message board for reminders.  7.    Use sticky notes.  Place sticky notes with reminders in a place where the task is performed.  For example:  turn off the  stove placed by the stove, lock the door placed on the door at eye level,  take your medications on  the bathroom mirror or by the place where you normally take your medications.  8.    Use alarms/timers.  Use while cooking to remind yourself to check on food or as a reminder to take  your medicine, or as a  reminder to make a call, or as a reminder to perform another task, etc.   Keeping Thinking Skills Sharp:  1. Jigsaw puzzles  2. Card/board games  3. Talking on the phone/going to social events  4. [employer]  5. Online games  6. Word searches/crossword puzzles  7.  Logic puzzles  8. Aerobic exercise (stationary bike)  9. Eating balanced diet (fruits & veggies)  10. Drink water   11. Try something new--new recipe, hobby  12. Crafts  13. Do a variety of activities that are challenging  14.  Plan weekly meals and write a grocery list  15. Add cognitive activities to walking/exercising (think of animal/food/city with each letter of the alphabet,  counting backwards, thinking of as many vegetables as you can, etc.).--Only do this if safe (no freezing/falls).

## 2024-01-12 ENCOUNTER — Encounter: Payer: Self-pay | Admitting: Occupational Therapy

## 2024-01-12 ENCOUNTER — Ambulatory Visit: Admitting: Occupational Therapy

## 2024-01-12 DIAGNOSIS — M6281 Muscle weakness (generalized): Secondary | ICD-10-CM

## 2024-01-12 DIAGNOSIS — R41842 Visuospatial deficit: Secondary | ICD-10-CM

## 2024-01-12 DIAGNOSIS — R41840 Attention and concentration deficit: Secondary | ICD-10-CM

## 2024-01-12 DIAGNOSIS — R29898 Other symptoms and signs involving the musculoskeletal system: Secondary | ICD-10-CM

## 2024-01-12 DIAGNOSIS — R29818 Other symptoms and signs involving the nervous system: Secondary | ICD-10-CM

## 2024-01-12 DIAGNOSIS — R41844 Frontal lobe and executive function deficit: Secondary | ICD-10-CM

## 2024-01-12 DIAGNOSIS — R2681 Unsteadiness on feet: Secondary | ICD-10-CM

## 2024-01-12 DIAGNOSIS — R278 Other lack of coordination: Secondary | ICD-10-CM

## 2024-01-12 NOTE — Therapy (Signed)
 " OUTPATIENT OCCUPATIONAL THERAPY [facility] NOTE  Patient Name: Joshua [name] MRN: [id number] DOB:31-Mar-1945, 78 y.o., male Today's Date: 01/12/2024  PCP: Loreli Elsie JONETTA Mickey., MD  REFERRING PROVIDER: Evonnie Asberry RAMAN, DO  END OF SESSION:  OT End of Session - 01/12/24 1235     Visit Number 14    Number of Visits 17    Date for Recertification  01/21/24    Authorization Type [employer]    OT Start Time 1233    OT Stop Time 1315    OT Time Calculation (min) 42 min    Equipment Utilized During Treatment coins, towel, nuts and bolts    Activity Tolerance Patient tolerated treatment well    Behavior During Therapy WFL for tasks assessed/performed           Past Medical History:  Diagnosis Date   Allergic rhinitis    Anxiety    from chronic pain from surgery- on Cymbalta    Arthritis    Asthma    Barrett's esophagus 03/29/2014   Carotid artery disease    Carotid Doppler normal August, 2007   Coronary artery disease    Diverticulosis    Dyslipidemia    GERD (gastroesophageal reflux disease)    History of loop recorder    has since 04/04/15   HTN (hypertension)    takes Metoprolol  for PVC control   Hx of colonic polyps    adenomatous   IFG (impaired fasting glucose)    Myocardial infarction (HCC)    mild   Neuromuscular disorder (HCC)    parkinson's tremors in hands   Palpitations    Benign PVCs   Parkinson disease (HCC)    Pneumonia    Prostate cancer (HCC)    RBBB (right bundle branch block)    rate related   Shingles    Stroke [facility]) 2017   January 01, 2023,right hand weakness,   TIA (transient ischemic attack) 02/2015   Per pt, had 2 strokes   Vertigo    Past Surgical History:  Procedure Laterality Date   BACK SURGERY  2002,2009   x 6   CHOLECYSTECTOMY  11/30/2012   with IOC   COLONOSCOPY     ELECTROPHYSIOLOGIC STUDY N/A 09/26/2015   Procedure: V Tach Ablation (PVC);  Surgeon: Will Gladis Norton, MD;  Location: [facility];  Service: Cardiovascular;  Laterality: N/A;   EP IMPLANTABLE DEVICE N/A 04/04/2015   Procedure: Loop Recorder Insertion;  Surgeon: Will Gladis Norton, MD;  Location: [facility];  Service: Cardiovascular;  Laterality: N/A;   HERNIA REPAIR     laprascopic   KNEE SURGERY     DECEMBER 2017,LEFT KNEE SCOPED   LEFT HEART CATH AND CORONARY ANGIOGRAPHY N/A 02/05/2021   Procedure: LEFT HEART CATH AND CORONARY ANGIOGRAPHY;  Surgeon: Darron Deatrice LABOR, MD;  Location: [facility];  Service: Cardiovascular;  Laterality: N/A;   NECK SURGERY  2002   POLYPECTOMY     ROTATOR CUFF REPAIR Left    TEE WITHOUT CARDIOVERSION N/A 04/04/2015   Procedure: TRANSESOPHAGEAL ECHOCARDIOGRAM (TEE);  Surgeon: Redell RAMAN Shallow, MD;  Location: [facility] ENDOSCOPY;  Service: Cardiovascular;  Laterality: N/A;   TOTAL KNEE ARTHROPLASTY Left 09/28/2022   Procedure: TOTAL KNEE ARTHROPLASTY;  Surgeon: Edna Toribio LABOR, MD;  Location: [facility] ORS;  Service: Orthopedics;  Laterality: Left;   TOTAL KNEE ARTHROPLASTY Right 01/11/2023   Procedure: TOTAL KNEE ARTHROPLASTY;  Surgeon: Edna Toribio LABOR, MD;  Location: [facility] OR[facility];  Service: Orthopedics;  Laterality: Right;   TRIGGER FINGER RELEASE Left 12/28/2019   Procedure: RELEASE TRIGGER FINGER/A-1 PULLEY THUMB, MIDDLE AND RING;  Surgeon: Murrell Kuba, MD;  Location: [facility];  Service: Orthopedics;  Laterality: Left;  FAB   UPPER GASTROINTESTINAL ENDOSCOPY     V Tach ablation  09/26/2015   Patient Active Problem List   Diagnosis Date Noted   CVA (cerebral vascular accident) (HCC) 01/15/2023   Primary osteoarthritis of right knee 01/11/2023   Localized osteoarthritis of right knee 01/11/2023   Primary osteoarthritis of left knee 09/28/2022   SBO (small bowel obstruction) (HCC) 05/29/2022   Chest pain 05/26/2022   Epigastric abdominal pain 05/26/2022   Coronary artery disease involving native coronary artery of native heart without angina pectoris  05/28/2021   History of myocardial infarction 02/05/2021   Parkinson's disease (HCC)    Malignant neoplasm of prostate (HCC) 11/01/2017   Essential tremor 12/06/2015   PVC (premature ventricular contraction) 09/26/2015   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) 04/07/2015   Acute CVA (cerebrovascular accident) (HCC) 03/06/2015   Stroke (cerebrum) (HCC) 03/05/2015   Cerebral infarction (HCC) 03/05/2015   Weakness of right upper extremity    Tremor    Ejection fraction    Vertigo    Sleep apnea    Palpitations    Hyperlipidemia with target LDL less than 70    GERD (gastroesophageal reflux disease)    HTN (hypertension)    Chest pain    RBBB (right bundle branch block)    Ejection fraction    Carotid artery disease (HCC)    ONSET DATE: 11/03/2023 (Date of referral)  REFERRING DIAG: G20.B1 (ICD-10-CM) - Parkinson's disease with dyskinesia, without mention of fluctuations   THERAPY DIAG:  Other lack of coordination  Muscle weakness (generalized)  Unsteadiness on feet  Visuospatial deficit  Attention and concentration deficit  Frontal lobe and executive function deficit  Other symptoms and signs involving the musculoskeletal system  Other symptoms and signs involving the nervous system  Rationale for Evaluation and Treatment: Rehabilitation  SUBJECTIVE:   SUBJECTIVE STATEMENT: No falls, same pain (back)  Tremors seem to be worse today  Pt accompanied by: self  PERTINENT HISTORY: Rt TKA 01/11/23 with subsequent acute Lt CVA affecting Rt side, PD, Anxiety, HTN, HLD, multiple back surgeries/fusion neck and lumbar spine,  NSTEMI, 01/2020, hx of multiple syncopal episodes, CVA in 2017   PRECAUTIONS: Fall  WEIGHT BEARING RESTRICTIONS: No  PAIN:  Are you having pain? Yes: NPRS scale: 7/10 Pain location: back Pain description: pulled Aggravating factors: exercises Relieving factors: rest, medications  FALLS: Has patient fallen in last 6 months?  Yes. Number of falls 3  LIVING ENVIRONMENT: Lives with: lives with their spouse Lives in: House/apartment Stairs: Yes: External: 1 steps; none Has following equipment at home: Single point cane, Walker - 2 wheeled, Environmental Consultant - 4 wheeled, shower chair, Grab bars, and [employer] and transport chair  PLOF: Independent; horticulturist, commercial; driving   PATIENT GOALS: To improve dishes, folding laundry, and   OBJECTIVE:  Note: Objective measures were completed at Evaluation unless otherwise noted.  HAND DOMINANCE: Right  ADLs: Overall ADLs: mod I  Eating: setup for cutting salad  Equipment: hower seat with back, Grab bars, Walk in shower, Reacher, Sock aid, Long handled shoe horn, and Long handled sponge  IADLs: Shopping: supervision Light housekeeping: mod I Meal Prep: mod I Community mobility: driving mod I Medication management: mod I Financial management: 50/50 with wife Handwriting: 25% legible  or less with pen vs pencil  MOBILITY STATUS: Needs Assist: uses SPC and Hx of falls   POSTURE COMMENTS:  rounded shoulders, forward head, increased thoracic kyphosis, posterior pelvic tilt, and weight shift right   ACTIVITY TOLERANCE: Activity tolerance: good to fair  FUNCTIONAL OUTCOME MEASURES: 11/12/2023: Fastening/unfastening 3 buttons: only able to button 2 buttons in 150 seconds  11/16/2023: using button hook for buttoning and unbuttoning, pt able to complete in 102 seconds 11/26/2023: 72 sec following repeat education 12/24/23: 70 seconds   Physical performance test: PPT#2 (simulated eating) Right: 25 seconds; Left: 24 sec    PPT#4 (donning/doffing jacket): TBD  Baseline: 30 seconds 12/24/23: 20 seconds  Goal status: GOAL MET  HAND FUNCTION: Grip strength: Right: 28.2 lbs  COORDINATION: 9 Hole Peg test: Right: 168 sec; Left: 57 sec Box and Blocks:  Right 25 blocks, Left 43 blocks   9HPT: 12/24/23: Right: Able to put in 4 in 3 minutes, Left: 43.45  seconds  Box and Blocks: 12/24/23: Right: 31 blocks, Left 43   UE ROM:  WFL; chronic wrist injury with limited flexion and no ext.  UE MMT:   WFL  SENSATION: Moderate to severe paresthesias in R hand and fingers primarily but extends up to shoulder, worse since stroke   MUSCLE TONE: RUE: Rigidity; mild   COGNITION: Overall cognitive status: Within functional limits for tasks assessed  OBSERVATIONS: Bradykinesia and Postural tremors                                                                                                                    TREATMENT :   Assessed all STG's and most LTG's - see below. Pt improved in all goals/measurements except Rt hand coordination for 9 hole peg test, however has improved with Rt hand function for eating and writing.  Reviewed strategies for tremors including a reset opening hands big and some light weight bearing through hands, and anchoring arm against body or on table prn Reviewed strategies for starting zipper on jacket in standing including: standing w/ back against counter, opening hands big prior to starting, anchoring arm against body, and use of zipper pull (key ring)  Discussed what patient wishes to work on next week and can possibly wrap up next session  PATIENT EDUCATION: Education details: see above Person educated: Patient Education method: Explanation, Verbal cues, and Handouts Education comprehension: verbalized understanding, verbal cues required, and needs further education  HOME EXERCISE PROGRAM: 11/12/2023: buttoning 11/16/2023: Big ADLs; coordination; PWR! Hands 11/26/2023: Stocking donning 12/06/23: tremor strategies, ex's for neck and posture 12/28/23: bag exercises 12/30/23: additional coordination exercise 01/03/24: golf solitaire 01/10/24: ways to prevent future PD related complications, memory compensation strategies, and ways to keep thinking skills sharp  GOALS:  SHORT TERM GOALS: Target date: 01/21/2024     Pt will be independent with PD specific HEP.  Baseline: not yet initiated Goal status: MET   2.  Pt will verbalize understanding of adapted strategies to maximize safety and independence with ADLs/IADLs.  Baseline:  not yet initiated Goal status: MET  3.  Pt will write a sentence with no significant decrease in size and maintain 50% legibility.  Baseline: <25% legibility 12/08/2023: no significant increase to size, use of weighted pen, and >50% legibility Goal status: MET  4.  Pt will demonstrate improved fine motor coordination for ADLs as evidenced by decreasing 9 hole peg test score for each hand by at least 8 seconds.  Baseline: Right: 168 sec; Left: 57 sec 12/24/23: Right: Able to put in 4 in 3 minutes, Left: 43.45 seconds 01/12/24: Rt = placed 5 in 3 minutes, Lt = 37.43 Goal status: PARTIALLY MET (Met on Lt, not on Rt)  5.  Pt will demonstrate improved ease with fastening buttons as evidenced by improving 3 button/unbutton time to be able to complete full assessment in 85 seconds or less.  Baseline: only able to button 2 buttons in 150 seconds - pt reported unable to feel his fingertips at time of eval 11/16/2023: using button hook for buttoning and unbuttoning, pt able to complete in 102 seconds 11/26/2023: 72 sec following repeat education 12/24/23: 70 seconds  Goal status: MET   LONG TERM GOALS: Target date: 01/21/2024    1.  Pt will verbalize understanding of ways to prevent future PD related complications and PD community resources.  Baseline: not yet initiated Goal status: MET  2.  Pt will write a short paragraph with no significant decrease in size and maintain 75% legibility.  Baseline: <25% legibility 12/24/23: with increased time, 50% legible 12/28/23: 60% legibility with increased time 01/12/24: 75% with weighted pen, extra time Goal status: MET  3.  Pt will verbalize understanding of ways to keep thinking skills sharp and ways to compensate for STM  changes in the future.  Baseline: not yet initiated Goal status: MET   4.  Pt will demonstrate improved ease with feeding as evidenced by decreasing PPT#2 by at least 4 seconds.  Baseline: Right: 25 seconds; Left: 24 sec  12/24/23: Right: 31 seconds with standard spoon, 35 seconds weighted spoon (dropped one),  Left: 27 seconds weighted spoon, 14 seconds standard spoon 01/12/24: Rt = 21 sec, Lt = 19 sec Goal status: MET   5.  Pt will demonstrate increased ease with dressing as evidenced by decreasing PPT#4 (don/ doff jacket) to 20 secs or less.  Baseline: 30 seconds 12/24/23: 20 seconds  Goal status:  MET  6.  Pt will demonstrate improved fine motor coordination for ADLs as evidenced by decreasing 9 hole peg test score for each hand by at least 12 secs  Baseline: Right: 168 sec; Left: 57 sec 12/24/23: Right: Able to put in 4 in 3 minutes, Left: 43.45 seconds 01/12/24: Rt = placed 5 in 3 minutes, Lt = 37.43 Goal status: PARTIALLY MET (Met on Lt, not on Rt)  7.  Pt will be able to place at least 8 blocks using R hand with completion of Box and Blocks test.  Baseline:  Right 25 blocks, Left 43 blocks 12/24/23: Right: 31 blocks, Left 43,  Goal status: IN PROGRESS  ASSESSMENT:  CLINICAL IMPRESSION: Pt seen this date seen for occupational therapy tx for PD mgmt. Pt reports significant improvement in function, ADL status including LE dressing and writing. Pt meeting most goals and anticipate d/c next week  PERFORMANCE DEFICITS: in functional skills including ADLs, IADLs, coordination, dexterity, strength, Fine motor control, Gross motor control, mobility, decreased knowledge of precautions, decreased knowledge of use of DME, and UE functional use.  IMPAIRMENTS: are limiting patient from ADLs, IADLs, leisure, and social participation.   COMORBIDITIES:  may have co-morbidities  that affects occupational performance. Patient will benefit from skilled OT to address above impairments  and improve overall function.  REHAB POTENTIAL: Good  PLAN:  OT FREQUENCY: 1-2x/week  OT DURATION: 8 weeks  PLANNED INTERVENTIONS: 97168 OT Re-evaluation, 97535 self care/ADL training, [id number] therapeutic exercise, 97530 therapeutic activity, 97112 neuromuscular re-education, 97750 Physical Performance Testing, functional mobility training, coping strategies training, patient/family education, and DME and/or AE instructions  RECOMMENDED OTHER SERVICES: N/A for this visit  CONSULTED AND AGREED WITH PLAN OF CARE: Patient  PLAN FOR NEXT SESSION:  ? Practice tying tie if pt brings in, check remaining goal, UBE, D/C next week - possibly next session      Burnard JINNY Roads, OT 01/12/2024, 12:36 PM   "

## 2024-01-18 ENCOUNTER — Ambulatory Visit

## 2024-01-18 ENCOUNTER — Ambulatory Visit: Admitting: Occupational Therapy

## 2024-01-20 ENCOUNTER — Ambulatory Visit: Admitting: Occupational Therapy

## 2024-01-21 ENCOUNTER — Ambulatory Visit: Attending: Internal Medicine | Admitting: Occupational Therapy

## 2024-01-21 ENCOUNTER — Ambulatory Visit

## 2024-01-21 DIAGNOSIS — R4701 Aphasia: Secondary | ICD-10-CM

## 2024-01-21 DIAGNOSIS — R41844 Frontal lobe and executive function deficit: Secondary | ICD-10-CM | POA: Diagnosis present

## 2024-01-21 DIAGNOSIS — R278 Other lack of coordination: Secondary | ICD-10-CM | POA: Insufficient documentation

## 2024-01-21 DIAGNOSIS — M6281 Muscle weakness (generalized): Secondary | ICD-10-CM | POA: Insufficient documentation

## 2024-01-21 DIAGNOSIS — R41842 Visuospatial deficit: Secondary | ICD-10-CM | POA: Diagnosis present

## 2024-01-21 DIAGNOSIS — R29818 Other symptoms and signs involving the nervous system: Secondary | ICD-10-CM | POA: Insufficient documentation

## 2024-01-21 DIAGNOSIS — R41840 Attention and concentration deficit: Secondary | ICD-10-CM | POA: Insufficient documentation

## 2024-01-21 DIAGNOSIS — R471 Dysarthria and anarthria: Secondary | ICD-10-CM

## 2024-01-21 DIAGNOSIS — R29898 Other symptoms and signs involving the musculoskeletal system: Secondary | ICD-10-CM | POA: Diagnosis present

## 2024-01-21 NOTE — Therapy (Signed)
 " OUTPATIENT OCCUPATIONAL THERAPY [facility]GE  Patient Name: Joshua [name] MRN: [id number] DOB:07/25/1945, 79 y.o., male Today's Date: 01/21/2024  PCP: Loreli Elsie JONETTA Mickey., MD  REFERRING PROVIDER: Evonnie Asberry RAMAN, DO  END OF SESSION:  OT End of Session - 01/21/24 1156     Visit Number 15    Number of Visits 17    Date for Recertification  01/21/24    Authorization Type [employer]    OT Start Time 1154    OT Stop Time 1234    OT Time Calculation (min) 40 min    Activity Tolerance Patient tolerated treatment well    Behavior During Therapy [facility] for tasks assessed/performed          Past Medical History:  Diagnosis Date   Allergic rhinitis    Anxiety    from chronic pain from surgery- on Cymbalta    Arthritis    Asthma    Barrett's esophagus 03/29/2014   Carotid artery disease    Carotid Doppler normal August, 2007   Coronary artery disease    Diverticulosis    Dyslipidemia    GERD (gastroesophageal reflux disease)    History of loop recorder    has since 04/04/15   HTN (hypertension)    takes Metoprolol  for PVC control   Hx of colonic polyps    adenomatous   IFG (impaired fasting glucose)    Myocardial infarction (HCC)    mild   Neuromuscular disorder (HCC)    parkinson's tremors in hands   Palpitations    Benign PVCs   Parkinson disease (HCC)    Pneumonia    Prostate cancer (HCC)    RBBB (right bundle branch block)    rate related   Shingles    Stroke [facility]) 2017   January 01, 2023,right hand weakness,   TIA (transient ischemic attack) 02/2015   Per pt, had 2 strokes   Vertigo    Past Surgical History:  Procedure Laterality Date   BACK SURGERY  2002,2009   x 6   CHOLECYSTECTOMY  11/30/2012   with IOC   COLONOSCOPY     ELECTROPHYSIOLOGIC STUDY N/A 09/26/2015   Procedure: V Tach Ablation (PVC);  Surgeon: Will Gladis Norton, MD;  Location: [facility] INVASIVE CV LAB;  Service: Cardiovascular;  Laterality: N/A;   EP  IMPLANTABLE DEVICE N/A 04/04/2015   Procedure: Loop Recorder Insertion;  Surgeon: Will Gladis Norton, MD;  Location: [facility] INVASIVE CV LAB;  Service: Cardiovascular;  Laterality: N/A;   HERNIA REPAIR     laprascopic   KNEE SURGERY     DECEMBER 2017,LEFT KNEE SCOPED   LEFT HEART CATH AND CORONARY ANGIOGRAPHY N/A 02/05/2021   Procedure: LEFT HEART CATH AND CORONARY ANGIOGRAPHY;  Surgeon: Darron Deatrice LABOR, MD;  Location: [facility] INVASIVE CV LAB;  Service: Cardiovascular;  Laterality: N/A;   NECK SURGERY  2002   POLYPECTOMY     ROTATOR CUFF REPAIR Left    TEE WITHOUT CARDIOVERSION N/A 04/04/2015   Procedure: TRANSESOPHAGEAL ECHOCARDIOGRAM (TEE);  Surgeon: Redell RAMAN Shallow, MD;  Location: [facility] ENDOSCOPY;  Service: Cardiovascular;  Laterality: N/A;   TOTAL KNEE ARTHROPLASTY Left 09/28/2022   Procedure: TOTAL KNEE ARTHROPLASTY;  Surgeon: Edna Toribio LABOR, MD;  Location: [facility] ORS;  Service: Orthopedics;  Laterality: Left;   TOTAL KNEE ARTHROPLASTY Right 01/11/2023   Procedure: TOTAL KNEE ARTHROPLASTY;  Surgeon: Edna Toribio LABOR, MD;  Location: [facility] ORS;  Service: Orthopedics;  Laterality: Right;   TRIGGER FINGER RELEASE Left 12/28/2019  Procedure: RELEASE TRIGGER FINGER/A-1 PULLEY THUMB, MIDDLE AND RING;  Surgeon: Murrell Kuba, MD;  Location: [facility];  Service: Orthopedics;  Laterality: Left;  FAB   UPPER GASTROINTESTINAL ENDOSCOPY     V Tach ablation  09/26/2015   Patient Active Problem List   Diagnosis Date Noted   CVA (cerebral vascular accident) (HCC) 01/15/2023   Primary osteoarthritis of right knee 01/11/2023   Localized osteoarthritis of right knee 01/11/2023   Primary osteoarthritis of left knee 09/28/2022   SBO (small bowel obstruction) (HCC) 05/29/2022   Chest pain 05/26/2022   Epigastric abdominal pain 05/26/2022   Coronary artery disease involving native coronary artery of native heart without angina pectoris 05/28/2021   History of myocardial infarction 02/05/2021    Parkinson's disease (HCC)    Malignant neoplasm of prostate (HCC) 11/01/2017   Essential tremor 12/06/2015   PVC (premature ventricular contraction) 09/26/2015   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) 04/07/2015   Acute CVA (cerebrovascular accident) (HCC) 03/06/2015   Stroke (cerebrum) (HCC) 03/05/2015   Cerebral infarction (HCC) 03/05/2015   Weakness of right upper extremity    Tremor    Ejection fraction    Vertigo    Sleep apnea    Palpitations    Hyperlipidemia with target LDL less than 70    GERD (gastroesophageal reflux disease)    HTN (hypertension)    Chest pain    RBBB (right bundle branch block)    Ejection fraction    Carotid artery disease (HCC)    ONSET DATE: 11/03/2023 (Date of referral)  REFERRING DIAG: G20.B1 (ICD-10-CM) - Parkinson's disease with dyskinesia, without mention of fluctuations   THERAPY DIAG:  Other lack of coordination  Muscle weakness (generalized)  Visuospatial deficit  Attention and concentration deficit  Frontal lobe and executive function deficit  Other symptoms and signs involving the musculoskeletal system  Other symptoms and signs involving the nervous system  Rationale for Evaluation and Treatment: Rehabilitation  SUBJECTIVE:   SUBJECTIVE STATEMENT: He was able to write his signature in cursive and it was legible. Pt expresses confidence in HEP completion following OT d/c to maintain/improve level of function.  Pt accompanied by: self  PERTINENT HISTORY: Rt TKA 01/11/23 with subsequent acute Lt CVA affecting Rt side, PD, Anxiety, HTN, HLD, multiple back surgeries/fusion neck and lumbar spine,  NSTEMI, 01/2020, hx of multiple syncopal episodes, CVA in 2017   PRECAUTIONS: Fall  WEIGHT BEARING RESTRICTIONS: No  PAIN:  Are you having pain? Yes: NPRS scale: 7/10 Pain location: back Pain description: pulled Aggravating factors: exercises Relieving factors: rest, medications  FALLS: Has patient  fallen in last 6 months? Yes. Number of falls 3  LIVING ENVIRONMENT: Lives with: lives with their spouse Lives in: House/apartment Stairs: Yes: External: 1 steps; none Has following equipment at home: Single point cane, Walker - 2 wheeled, Environmental Consultant - 4 wheeled, shower chair, Grab bars, and [employer] and transport chair  PLOF: Independent; horticulturist, commercial; driving   PATIENT GOALS: To improve dishes, folding laundry, and   OBJECTIVE:  Note: Objective measures were completed at Evaluation unless otherwise noted.  HAND DOMINANCE: Right  ADLs: Overall ADLs: mod I  Eating: setup for cutting salad  Equipment: hower seat with back, Grab bars, Walk in shower, Reacher, Sock aid, Long handled shoe horn, and Long handled sponge  IADLs: Shopping: supervision Light housekeeping: mod I Meal Prep: mod I Community mobility: driving mod I Medication management: mod I Financial management: 50/50 with wife Handwriting: 25% legible  or less with pen vs pencil  MOBILITY STATUS: Needs Assist: uses SPC and Hx of falls   POSTURE COMMENTS:  rounded shoulders, forward head, increased thoracic kyphosis, posterior pelvic tilt, and weight shift right   ACTIVITY TOLERANCE: Activity tolerance: good to fair  FUNCTIONAL OUTCOME MEASURES: 11/12/2023: Fastening/unfastening 3 buttons: only able to button 2 buttons in 150 seconds  11/16/2023: using button hook for buttoning and unbuttoning, pt able to complete in 102 seconds 11/26/2023: 72 sec following repeat education 12/24/23: 70 seconds   Physical performance test: PPT#2 (simulated eating) Right: 25 seconds; Left: 24 sec    PPT#4 (donning/doffing jacket): TBD  Baseline: 30 seconds 12/24/23: 20 seconds  Goal status: GOAL MET  HAND FUNCTION: Grip strength: Right: 28.2 lbs  COORDINATION: 9 Hole Peg test: Right: 168 sec; Left: 57 sec Box and Blocks:  Right 25 blocks, Left 43 blocks   9HPT: 12/24/23: Right: Able to put in 4 in 3  minutes, Left: 43.45 seconds  Box and Blocks: 12/24/23: Right: 31 blocks, Left 43   UE ROM:  WFL; chronic wrist injury with limited flexion and no ext.  UE MMT:   WFL  SENSATION: Moderate to severe paresthesias in R hand and fingers primarily but extends up to shoulder, worse since stroke   MUSCLE TONE: RUE: Rigidity; mild   COGNITION: Overall cognitive status: Within functional limits for tasks assessed  OBSERVATIONS: Bradykinesia and Postural tremors                                                                                                                    TREATMENT :   Objective measures assessed as noted in Goals section to determine progression towards goals. Extensive review of tremor reduction strategies including deep breathing, relaxation, and getting up and moving around to reset if he becomes frustrated with tremors during task completion.  Therapist reviewed goals with patient and updated patient progression.  No additional functional limitations identified.  PATIENT EDUCATION: Education details: see above Person educated: Patient Education method: Explanation and Verbal cues Education comprehension: verbalized understanding, verbal cues required, and needs further education  HOME EXERCISE PROGRAM: 11/12/2023: buttoning 11/16/2023: Big ADLs; coordination; PWR! Hands 11/26/2023: Stocking donning 12/06/23: tremor strategies, ex's for neck and posture 12/28/23: bag exercises 12/30/23: additional coordination exercise 01/03/24: golf solitaire 01/10/24: ways to prevent future PD related complications, memory compensation strategies, and ways to keep thinking skills sharp  GOALS:  SHORT TERM GOALS: Target date: 01/21/2024    Pt will be independent with PD specific HEP.  Baseline: not yet initiated Goal status: MET   2.  Pt will verbalize understanding of adapted strategies to maximize safety and independence with ADLs/IADLs.  Baseline: not yet  initiated Goal status: MET  3.  Pt will write a sentence with no significant decrease in size and maintain 50% legibility.  Baseline: <25% legibility 12/08/2023: no significant increase to size, use of weighted pen, and >50% legibility Goal status: MET  4.  Pt will demonstrate improved fine motor coordination for  ADLs as evidenced by decreasing 9 hole peg test score for each hand by at least 8 seconds.  Baseline: Right: 168 sec; Left: 57 sec 12/24/23: Right: Able to put in 4 in 3 minutes, Left: 43.45 seconds 01/12/24: Rt = placed 5 in 3 minutes, Lt = 37.43 01/21/2024: no change Goal status: PARTIALLY MET (Met on Lt, not on Rt)  5.  Pt will demonstrate improved ease with fastening buttons as evidenced by improving 3 button/unbutton time to be able to complete full assessment in 85 seconds or less.  Baseline: only able to button 2 buttons in 150 seconds - pt reported unable to feel his fingertips at time of eval 11/16/2023: using button hook for buttoning and unbuttoning, pt able to complete in 102 seconds 11/26/2023: 72 sec following repeat education 12/24/23: 70 seconds  Goal status: MET   LONG TERM GOALS: Target date: 01/21/2024    1.  Pt will verbalize understanding of ways to prevent future PD related complications and PD community resources.  Baseline: not yet initiated Goal status: MET  2.  Pt will write a short paragraph with no significant decrease in size and maintain 75% legibility.  Baseline: <25% legibility 12/24/23: with increased time, 50% legible 12/28/23: 60% legibility with increased time 01/12/24: 75% with weighted pen, extra time Goal status: MET  3.  Pt will verbalize understanding of ways to keep thinking skills sharp and ways to compensate for STM changes in the future.  Baseline: not yet initiated Goal status: MET   4.  Pt will demonstrate improved ease with feeding as evidenced by decreasing PPT#2 by at least 4 seconds.  Baseline: Right: 25 seconds;  Left: 24 sec  12/24/23: Right: 31 seconds with standard spoon, 35 seconds weighted spoon (dropped one),  Left: 27 seconds weighted spoon, 14 seconds standard spoon 01/12/24: Rt = 21 sec, Lt = 19 sec Goal status: MET   5.  Pt will demonstrate increased ease with dressing as evidenced by decreasing PPT#4 (don/ doff jacket) to 20 secs or less.  Baseline: 30 seconds 12/24/23: 20 seconds  Goal status:  MET  6.  Pt will demonstrate improved fine motor coordination for ADLs as evidenced by decreasing 9 hole peg test score for each hand by at least 12 secs  Baseline: Right: 168 sec; Left: 57 sec 12/24/23: Right: Able to put in 4 in 3 minutes, Left: 43.45 seconds 01/12/24: Rt = placed 5 in 3 minutes, Lt = 37.43 01/21/2024: no change Goal status: PARTIALLY MET (Met on Lt, not on Rt)  7.  Pt will be able to place at least 8 additional blocks using R hand with completion of Box and Blocks test.  Baseline:  Right 25 blocks, Left 43 blocks 12/24/23: Right: 31 blocks, Left 43,  Goal status: MET 01/21/2024: Right: 38   ASSESSMENT:  CLINICAL IMPRESSION: Pt seen this date seen for occupational therapy tx for PD mgmt. Pt demonstrates and verbalizes good understanding of PD HEP and strategies as needed to maximize functional performance and sx management. Patient is appropriate for discharge and no longer demonstrates medical necessity for continued skilled occupational therapy services.  OCCUPATIONAL THERAPY DISCHARGE SUMMARY  Visits from Start of Care: 15  Current functional level related to goals / functional outcomes: Patient has met 4/5 short-term goals and 6/7 long-term goals to date.   Remaining deficits: Pt remains limited by Parkinson's, which affects his motor movements putting him at increased need for assistance and falls; however, he has resources and HEP as needed  to maintain his current level of function as much as possible.    Education / Equipment: Continue with HEP and strategies  following OT d/c to maximize function and maximize safety and overall level of independence.    Patient agrees to discharge. Patient goals were met. Patient is being discharged due to being pleased with the current functional level.[name]  PLAN:  OT D/C completed  CONSULTED AND AGREED WITH PLAN OF CARE: Patient   Jocelyn CHRISTELLA Bottom, OT 01/21/2024, 1:04 PM   "

## 2024-01-21 NOTE — Therapy (Signed)
 " OUTPATIENT SPEECH LANGUAGE PATHOLOGY PARKINSON'S TREATMENT   Patient Name: Joshua [name] MRN: [id number] DOB:Dec 31, 1945, 79 y.o., male Today's Date: 01/21/2024  PCP: Elsie Gentry, MD REFERRING PROVIDER: Elsie Gentry, MD  END OF SESSION:  End of Session - 01/21/24 1314     Visit Number 5    Number of Visits 17    Date for Recertification  02/02/24    SLP Start Time 1235    SLP Stop Time  1315    SLP Time Calculation (min) 40 min    Activity Tolerance Patient tolerated treatment well             Past Medical History:  Diagnosis Date   Allergic rhinitis    Anxiety    from chronic pain from surgery- on Cymbalta    Arthritis    Asthma    Barrett's esophagus 03/29/2014   Carotid artery disease    Carotid Doppler normal August, 2007   Coronary artery disease    Diverticulosis    Dyslipidemia    GERD (gastroesophageal reflux disease)    History of loop recorder    has since 04/04/15   HTN (hypertension)    takes Metoprolol  for PVC control   Hx of colonic polyps    adenomatous   IFG (impaired fasting glucose)    Myocardial infarction (HCC)    mild   Neuromuscular disorder (HCC)    parkinson's tremors in hands   Palpitations    Benign PVCs   Parkinson disease (HCC)    Pneumonia    Prostate cancer (HCC)    RBBB (right bundle branch block)    rate related   Shingles    Stroke [facility]) 2017   January 01, 2023,right hand weakness,   TIA (transient ischemic attack) 02/2015   Per pt, had 2 strokes   Vertigo    Past Surgical History:  Procedure Laterality Date   BACK SURGERY  2002,2009   x 6   CHOLECYSTECTOMY  11/30/2012   with IOC   COLONOSCOPY     ELECTROPHYSIOLOGIC STUDY N/A 09/26/2015   Procedure: V Tach Ablation (PVC);  Surgeon: Will Gladis Norton, MD;  Location: [facility];  Service: Cardiovascular;  Laterality: N/A;   EP IMPLANTABLE DEVICE N/A 04/04/2015   Procedure: Loop Recorder Insertion;  Surgeon: Will Gladis Norton, MD;  Location:  [facility];  Service: Cardiovascular;  Laterality: N/A;   HERNIA REPAIR     laprascopic   KNEE SURGERY     DECEMBER 2017,LEFT KNEE SCOPED   LEFT HEART CATH AND CORONARY ANGIOGRAPHY N/A 02/05/2021   Procedure: LEFT HEART CATH AND CORONARY ANGIOGRAPHY;  Surgeon: Darron Deatrice LABOR, MD;  Location: [facility];  Service: Cardiovascular;  Laterality: N/A;   NECK SURGERY  2002   POLYPECTOMY     ROTATOR CUFF REPAIR Left    TEE WITHOUT CARDIOVERSION N/A 04/04/2015   Procedure: TRANSESOPHAGEAL ECHOCARDIOGRAM (TEE);  Surgeon: Redell GORMAN Shallow, MD;  Location: [facility];  Service: Cardiovascular;  Laterality: N/A;   TOTAL KNEE ARTHROPLASTY Left 09/28/2022   Procedure: TOTAL KNEE ARTHROPLASTY;  Surgeon: Edna Toribio LABOR, MD;  Location: [facility];  Service: Orthopedics;  Laterality: Left;   TOTAL KNEE ARTHROPLASTY Right 01/11/2023   Procedure: TOTAL KNEE ARTHROPLASTY;  Surgeon: Edna Toribio LABOR, MD;  Location: [facility];  Service: Orthopedics;  Laterality: Right;   TRIGGER FINGER RELEASE Left 12/28/2019   Procedure: RELEASE TRIGGER FINGER/A-1 PULLEY THUMB, MIDDLE AND RING;  Surgeon: Murrell Kuba, MD;  Location: [facility];  Service: Orthopedics;  Laterality: Left;  FAB   UPPER GASTROINTESTINAL ENDOSCOPY     V Tach ablation  09/26/2015   Patient Active Problem List   Diagnosis Date Noted   CVA (cerebral vascular accident) (HCC) 01/15/2023   Primary osteoarthritis of right knee 01/11/2023   Localized osteoarthritis of right knee 01/11/2023   Primary osteoarthritis of left knee 09/28/2022   SBO (small bowel obstruction) (HCC) 05/29/2022   Chest pain 05/26/2022   Epigastric abdominal pain 05/26/2022   Coronary artery disease involving native coronary artery of native heart without angina pectoris 05/28/2021   History of myocardial infarction 02/05/2021   Parkinson's disease (HCC)    Malignant neoplasm of prostate (HCC) 11/01/2017   Essential tremor 12/06/2015   PVC  (premature ventricular contraction) 09/26/2015   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) 04/07/2015   Acute CVA (cerebrovascular accident) (HCC) 03/06/2015   Stroke (cerebrum) (HCC) 03/05/2015   Cerebral infarction (HCC) 03/05/2015   Weakness of right upper extremity    Tremor    Ejection fraction    Vertigo    Sleep apnea    Palpitations    Hyperlipidemia with target LDL less than 70    GERD (gastroesophageal reflux disease)    HTN (hypertension)    Chest pain    RBBB (right bundle branch block)    Ejection fraction    Carotid artery disease (HCC)     ONSET DATE: 11/03/2023 referral   REFERRING DIAG: G20.B1 (ICD-10-CM) - Parkinson's disease with dyskinesia, without mention of fluctuations   THERAPY DIAG:  Dysarthria and anarthria  Aphasia  Rationale for Evaluation and Treatment: Rehabilitation  SUBJECTIVE:   SUBJECTIVE STATEMENT: I get tongue tied and can't get my words out  Pt accompanied by: self  PERTINENT HISTORY: Rt TKA 01/11/23 with subsequent acute Lt CVA affecting Rt side, PD, Anxiety, HTN, HLD, multiple back surgeries/fusion neck and lumbar spine,  NSTEMI, 01/2020, hx of multiple syncopal episodes, CVA in 2017. Known to ST service from prior episodes addressing dysarthria.   PAIN:  Are you having pain? Yes: NPRS scale: 6-7 Pain location: R hand, back, neck Pain description: chronic pain Aggravating factors: stress Relieving factors: sitting quietly  LIVING ENVIRONMENT: Lives with: lives with their spouse, [name]  Lives in: House/apartment   PATIENT GOALS: to prevent decline, work on word finding     PATIENT REPORTED OUTCOME MEASURES (PROM): The Communicative Participation [employer]        Does your condition interfere with... Pt Rating   ...talking with people you know 1   ...communicating when you need to say something quickly 1   ...talking with people you do not know 0   ...communicating when you are out in your community 2    ...asking questions in a conversation 1   ....communicating in a small group of people 1   ...having a long conversation 1   ...giving detailed infomrmation 1   ...getting your turn in a fast moving conversation 1   ...trying to persuade a friend or family member to see a different point of view 3  TOTAL 12  3= Not at all; 2=A little; 1=Quite a bit; 0=Very much  TREATMENT DATE:   Pt has quit singing in his choir due to not being able to stand still and read the music. Are their any modifications he can use to participate? Would it be feasible to sit and sing? (Plan is t  01/21/24: Targeted volume and intelligibility using Speak Out! Lesson 4. Pt required occasional verbal cues, modeling, for volume and breath support.   Pt averages the following volume levels:  Sustained AH: 86 dB  Counting: 85 dB  Reading (phrases): 79 dB  Cognitive Exercise: 74 dB Required occasional verbal cues, modeling for carryover of intent /volume answering simple questions following structured practice. SLP targeted naming and anomia compensation of description through [employer] Analysis (SFA). SLP provided education on use of SFA as rehabilitation tool to improve lexical representation with improved word finding, use as structure to aid in description in conversation, and as self-cueing strategy. Given visual target, pt able to name 3/3 on initial attempt. Pt able to generate average of 6/6 salient features given min visual cues for description categories. Without visual cues for generating descriptions, pt averaged 5-6 salient features with given picture. Pt benefited from occasional min verbal cues for generating personal connections to object. Plan is to continue addressing communication support strategies for anomia and continue SPEAK OUT! Program.    01/10/24: Pt was late for session.   Targeted volume and intelligibility using Speak Out! Lesson 3. Pt required occasional min verbal cues, modeling, for volume and breath support.   Pt averages the following volume levels:  Sustained AH: 85 dB  Counting: 82 dB  Reading (phrases): 78 dB  Cognitive Exercise: 72 dB Required occasional min verbal cues, modeling for carryover of intent /volume answering simple questions following structured practice. SLP introduced Primary School Teacher (SFA) as an anomic strategy for pt's communication. SLP guided pt though utilizing SFA during structured exercises involving describing objects. Pt successfully described 2 objects using 6+ descriptions given occasional min A. SLP provided pt HEP involving SFA for continued practice. Plan is to continue SPEAK OUT! Program and anomia strategies.   01/05/24:  Targeted volume and intelligibility using Speak Out! Lesson 2. Pt required rare min verbal cues, modeling, for volume and breath support.   Pt averages the following volume levels:  Sustained AH: 86 dB  Counting: 85 dB  Reading (phrases): 80 dB  Cognitive Exercise: 78 dB Required rare min verbal cues, modeling for carryover of intent /volume answering simple questions following structured practice. SLP collaborated with pt about addressing accommodations for returning to his choir for singing. Pt notes that he continues to have difficulties with word-finding since his stroke. SLP educated pt and pt's wife about utilizing descriptions during moments of communication breakdown due to anomia. SLP instructed pt in singing with intent by having pt perform sustained phonation exercises. Pt achieved clear voicing for speech production in 85% of opportunities with sustained phonation exercises given occasional min A. Plan is to continue addressing communication support strategies for anomia and continue SPEAK OUT! Program.    01/03/24: Octaviano enters with hoarse rough voice. Generated strategies to  support processing, including self advocating, using hand or index finger up to indicate you need a minute to think or to get a turn in conversation, limiting background noise and distractions, educate others that you need more time to respond - See Patient Instructions.  Targeted voice quality intelligibility using Speak Out! Lesson E :Library,  A Year of Intent workbook, Week of December 1. Pt required usual  mod verbal cues,  modeling, to carry over intent and to complete each exercise accurately, slow and prolonged when required.  Pt averages the following volume levels:  Sustained AH: 82dB  Counting:85dB  Reading (phrases): 82dB  Cognitive Exercise: 74dB Required usual mod verbal cues to ID and correct hoarse voice by improving intent.   01/21/2024: Education provided on evaluation results and SLP's recommendations. Initiated training re: self-advocacy for communication, asking for time, HEP recommendations. Pt verbalizes agreement with POC, all questions answered to satisfaction.    PATIENT EDUCATION: Education details: Tour Manager Person educated: Patient Education method: Explanation Education comprehension: verbalized understanding  HOME EXERCISE PROGRAM: Speak Out daily   GOALS: Goals reviewed with patient? Yes  SHORT TERM GOALS: Target date: 01/05/2024  Pt will complete warm up exercises from Speak Out! with strong, clear vocal quality, exceeding target dB in all trials over 2 sessions  Baseline: Goal status: ONGOING  2.  Pt will meet or exceed target dB for paragraph level readings over 2 sessions with min-A Baseline:  Goal status: ONGOING  3.  Pt will complete moderately complex discourse level structured language tasks and utilize verbal compensations for anomia in 90% of opportunities  Baseline:  Goal status: ONGOING  4.  Pt will teach back anomia compensations/strategies with mod-I Baseline:  Goal status: ONGOING   LONG TERM GOALS: Target date: 02/02/2024  Pt  will verbalize x5 HEP activities for word-finding by d/c Baseline:  Goal status: ONGOING  2.  Pt will report improvement via PROM by at least 2 points by d/c  Baseline:  Goal status: ONGOING  3.  Pt will listen to 5 minute video/audio and verbally summarize key details in 2/2 opportunities  Baseline:  Goal status: ONGOING  4.  Pt will utilize anomia strategies/compensations in unstructured conversation with min-A Baseline:  Goal status: ONGOING  ASSESSMENT:  CLINICAL IMPRESSION: Patient is a 79 y.o. M who was seen today for motor speech and language evaluation in setting of PD and hx of CVA. Pt reports since stroke last year, he has been having trouble with word finding, significantly impacting his confidence in communication across communication partners (see PROM). During today's eval, pt's spoken expression is functional for simple conversation but challenges evidenced during tasks with increased complexity with pt demonstrating tangential speech without connecting key information to correctly answer questions (e.g., inference questions, asking for similarities). Auditory comprehension breakdowns evidenced with complex syntax. Pt denies cognitive concerns, challenges swallowing. Desires to continue working on voice/speech in setting of PD. Volume is WNL today but voice is very hoarse/rough. Quality improved during sustained ee and ooh but not with cued intent for ah or reading. I recommend skilled ST to address verbal expression and motor speech.   OBJECTIVE IMPAIRMENTS: Objective impairments include expressive language and dysarthria. These impairments are limiting patient from effectively communicating at home and in community.Factors affecting potential to achieve goals and functional outcome are co-morbidities.. Patient will benefit from skilled SLP services to address above impairments and improve overall function.  REHAB POTENTIAL: Good  PLAN:  SLP FREQUENCY: 2x/week  SLP  DURATION: 8 weeks  PLANNED INTERVENTIONS: Cueing hierachy, Internal/external aids, Functional tasks, SLP instruction and feedback, Compensatory strategies, Patient/family education, [phone] Treatment of speech (30 or 45 min) , and [id number]- Speech Eval Sound Prod, [place], Phon, Eval Compre, Express   [employer], M.A., CF-SLP 01/21/2024, 1:19 PM      "

## 2024-01-25 ENCOUNTER — Ambulatory Visit

## 2024-01-25 DIAGNOSIS — R278 Other lack of coordination: Secondary | ICD-10-CM | POA: Diagnosis not present

## 2024-01-25 DIAGNOSIS — R471 Dysarthria and anarthria: Secondary | ICD-10-CM

## 2024-01-25 DIAGNOSIS — R4701 Aphasia: Secondary | ICD-10-CM

## 2024-01-25 NOTE — Therapy (Signed)
 " OUTPATIENT SPEECH LANGUAGE PATHOLOGY PARKINSON'S TREATMENT   Patient Name: Joshua [name] MRN: [id number] DOB:04-19-1945, 79 y.o., male Today's Date: 01/25/2024  PCP: Elsie Gentry, MD REFERRING PROVIDER: Elsie Gentry, MD  END OF SESSION:  End of Session - 01/25/24 1101     Visit Number 6    Number of Visits 17    Date for Recertification  02/02/24    SLP Start Time 1018    SLP Stop Time  1101    SLP Time Calculation (min) 43 min    Activity Tolerance Patient tolerated treatment well              Past Medical History:  Diagnosis Date   Allergic rhinitis    Anxiety    from chronic pain from surgery- on Cymbalta    Arthritis    Asthma    Barrett's esophagus 03/29/2014   Carotid artery disease    Carotid Doppler normal August, 2007   Coronary artery disease    Diverticulosis    Dyslipidemia    GERD (gastroesophageal reflux disease)    History of loop recorder    has since 04/04/15   HTN (hypertension)    takes Metoprolol  for PVC control   Hx of colonic polyps    adenomatous   IFG (impaired fasting glucose)    Myocardial infarction (HCC)    mild   Neuromuscular disorder (HCC)    parkinson's tremors in hands   Palpitations    Benign PVCs   Parkinson disease (HCC)    Pneumonia    Prostate cancer (HCC)    RBBB (right bundle branch block)    rate related   Shingles    Stroke [facility]) 2017   January 01, 2023,right hand weakness,   TIA (transient ischemic attack) 02/2015   Per pt, had 2 strokes   Vertigo    Past Surgical History:  Procedure Laterality Date   BACK SURGERY  2002,2009   x 6   CHOLECYSTECTOMY  11/30/2012   with IOC   COLONOSCOPY     ELECTROPHYSIOLOGIC STUDY N/A 09/26/2015   Procedure: V Tach Ablation (PVC);  Surgeon: Will Gladis Norton, MD;  Location: [facility];  Service: Cardiovascular;  Laterality: N/A;   EP IMPLANTABLE DEVICE N/A 04/04/2015   Procedure: Loop Recorder Insertion;  Surgeon: Will Gladis Norton, MD;   Location: [facility];  Service: Cardiovascular;  Laterality: N/A;   HERNIA REPAIR     laprascopic   KNEE SURGERY     DECEMBER 2017,LEFT KNEE SCOPED   LEFT HEART CATH AND CORONARY ANGIOGRAPHY N/A 02/05/2021   Procedure: LEFT HEART CATH AND CORONARY ANGIOGRAPHY;  Surgeon: Darron Deatrice LABOR, MD;  Location: [facility];  Service: Cardiovascular;  Laterality: N/A;   NECK SURGERY  2002   POLYPECTOMY     ROTATOR CUFF REPAIR Left    TEE WITHOUT CARDIOVERSION N/A 04/04/2015   Procedure: TRANSESOPHAGEAL ECHOCARDIOGRAM (TEE);  Surgeon: Redell GORMAN Shallow, MD;  Location: [facility] ENDOSCOPY;  Service: Cardiovascular;  Laterality: N/A;   TOTAL KNEE ARTHROPLASTY Left 09/28/2022   Procedure: TOTAL KNEE ARTHROPLASTY;  Surgeon: Edna Toribio LABOR, MD;  Location: [facility] ORS;  Service: Orthopedics;  Laterality: Left;   TOTAL KNEE ARTHROPLASTY Right 01/11/2023   Procedure: TOTAL KNEE ARTHROPLASTY;  Surgeon: Edna Toribio LABOR, MD;  Location: [facility] OR[facility];  Service: Orthopedics;  Laterality: Right;   TRIGGER FINGER RELEASE Left 12/28/2019   Procedure: RELEASE TRIGGER FINGER/A-1 PULLEY THUMB, MIDDLE AND RING;  Surgeon: Murrell Kuba, MD;  Location: [facility]  [place];  Service: Orthopedics;  Laterality: Left;  FAB   UPPER GASTROINTESTINAL ENDOSCOPY     V Tach ablation  09/26/2015   Patient Active Problem List   Diagnosis Date Noted   CVA (cerebral vascular accident) (HCC) 01/15/2023   Primary osteoarthritis of right knee 01/11/2023   Localized osteoarthritis of right knee 01/11/2023   Primary osteoarthritis of left knee 09/28/2022   SBO (small bowel obstruction) (HCC) 05/29/2022   Chest pain 05/26/2022   Epigastric abdominal pain 05/26/2022   Coronary artery disease involving native coronary artery of native heart without angina pectoris 05/28/2021   History of myocardial infarction 02/05/2021   Parkinson's disease (HCC)    Malignant neoplasm of prostate (HCC) 11/01/2017   Essential tremor  12/06/2015   PVC (premature ventricular contraction) 09/26/2015   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) 04/07/2015   Acute CVA (cerebrovascular accident) (HCC) 03/06/2015   Stroke (cerebrum) (HCC) 03/05/2015   Cerebral infarction (HCC) 03/05/2015   Weakness of right upper extremity    Tremor    Ejection fraction    Vertigo    Sleep apnea    Palpitations    Hyperlipidemia with target LDL less than 70    GERD (gastroesophageal reflux disease)    HTN (hypertension)    Chest pain    RBBB (right bundle branch block)    Ejection fraction    Carotid artery disease (HCC)     ONSET DATE: 11/03/2023 referral   REFERRING DIAG: G20.B1 (ICD-10-CM) - Parkinson's disease with dyskinesia, without mention of fluctuations   THERAPY DIAG:  Dysarthria and anarthria  Aphasia  Rationale for Evaluation and Treatment: Rehabilitation  SUBJECTIVE:   SUBJECTIVE STATEMENT: I get tongue tied and can't get my words out  Pt accompanied by: self  PERTINENT HISTORY: Rt TKA 01/11/23 with subsequent acute Lt CVA affecting Rt side, PD, Anxiety, HTN, HLD, multiple back surgeries/fusion neck and lumbar spine,  NSTEMI, 01/2020, hx of multiple syncopal episodes, CVA in 2017. Known to ST service from prior episodes addressing dysarthria.   PAIN:  Are you having pain? Yes: NPRS scale: 6-7 Pain location: R hand, back, neck Pain description: chronic pain Aggravating factors: stress Relieving factors: sitting quietly  LIVING ENVIRONMENT: Lives with: lives with their spouse, [name]  Lives in: House/apartment   PATIENT GOALS: to prevent decline, work on word finding     PATIENT REPORTED OUTCOME MEASURES (PROM): The Communicative Participation Item [employer]        Does your condition interfere with... Pt Rating   ...talking with people you know 1   ...communicating when you need to say something quickly 1   ...talking with people you do not know 0   ...communicating when you are out  in your community 2   ...asking questions in a conversation 1   ....communicating in a small group of people 1   ...having a long conversation 1   ...giving detailed infomrmation 1   ...getting your turn in a fast moving conversation 1   ...trying to persuade a friend or family member to see a different point of view 3  TOTAL 12  3= Not at all; 2=A little; 1=Quite a bit; 0=Very much  TREATMENT DATE:   Pt has quit singing in his choir due to not being able to stand still and read the music. Are their any modifications he can use to participate? Would it be feasible to sit and sing? (Plan is t  01/25/24: Pt throat cleared >7x during sessions. SLP briefly reviewed throat clearing strategies with pt for optimizing vocal hygiene. Targeted volume and intelligibility using Speak Out! Lesson 10. Pt required occasional verbal cues, modeling, for volume and breath support.   Pt averages the following volume levels:  Sustained AH: 83 dB  Counting:83 dB  Reading (phrases): 77 dB  Cognitive Exercise: 74 dB Required occasional verbal cues, modeling for carryover of intent /volume answering simple questions following structured practice.  To target word finding, Scientist, Product/process Development (VNeST) was utilized. The pt generated 3 subjects and objects for 2 verbs, for a total of 12 subject objects. Pt required occasional min verbal cues. Pt generated 2 complex sentences by answering wh questions. Pt required frequent mod to min verbal cues to generate complex sentences. SLP updated pt's HEP to include VNeST exercises for maximizing communication skills. Plan is to continue VNeST and SPEAK OUT! Program.   01/21/24: Targeted volume and intelligibility using Speak Out! Lesson 4. Pt required occasional verbal cues, modeling, for volume and breath support.   Pt averages the following  volume levels:  Sustained AH: 86 dB  Counting: 85 dB  Reading (phrases): 79 dB  Cognitive Exercise: 74 dB Required occasional verbal cues, modeling for carryover of intent /volume answering simple questions following structured practice. SLP targeted naming and anomia compensation of description through [employer] Analysis (SFA). SLP provided education on use of SFA as rehabilitation tool to improve lexical representation with improved word finding, use as structure to aid in description in conversation, and as self-cueing strategy. Given visual target, pt able to name 3/3 on initial attempt. Pt able to generate average of 6/6 salient features given min visual cues for description categories. Without visual cues for generating descriptions, pt averaged 5-6 salient features with given picture. Pt benefited from occasional min verbal cues for generating personal connections to object. Plan is to continue addressing communication support strategies for anomia and continue SPEAK OUT! Program.    01/10/24: Pt was late for session.  Targeted volume and intelligibility using Speak Out! Lesson 3. Pt required occasional min verbal cues, modeling, for volume and breath support.   Pt averages the following volume levels:  Sustained AH: 85 dB  Counting: 82 dB  Reading (phrases): 78 dB  Cognitive Exercise: 72 dB Required occasional min verbal cues, modeling for carryover of intent /volume answering simple questions following structured practice. SLP introduced Primary School Teacher (SFA) as an anomic strategy for pt's communication. SLP guided pt though utilizing SFA during structured exercises involving describing objects. Pt successfully described 2 objects using 6+ descriptions given occasional min A. SLP provided pt HEP involving SFA for continued practice. Plan is to continue SPEAK OUT! Program and anomia strategies.   01/05/24:  Targeted volume and intelligibility using Speak Out! Lesson 2. Pt  required rare min verbal cues, modeling, for volume and breath support.   Pt averages the following volume levels:  Sustained AH: 86 dB  Counting: 85 dB  Reading (phrases): 80 dB  Cognitive Exercise: 78 dB Required rare min verbal cues, modeling for carryover of intent /volume answering simple questions following structured practice. SLP collaborated with pt about addressing accommodations for returning to his choir for singing. Pt notes that  he continues to have difficulties with word-finding since his stroke. SLP educated pt and pt's wife about utilizing descriptions during moments of communication breakdown due to anomia. SLP instructed pt in singing with intent by having pt perform sustained phonation exercises. Pt achieved clear voicing for speech production in 85% of opportunities with sustained phonation exercises given occasional min A. Plan is to continue addressing communication support strategies for anomia and continue SPEAK OUT! Program.    01/03/24: Octaviano enters with hoarse rough voice. Generated strategies to support processing, including self advocating, using hand or index finger up to indicate you need a minute to think or to get a turn in conversation, limiting background noise and distractions, educate others that you need more time to respond - See Patient Instructions.  Targeted voice quality intelligibility using Speak Out! Lesson E :Library,  A Year of Intent workbook, Week of December 1. Pt required usual  mod verbal cues, modeling, to carry over intent and to complete each exercise accurately, slow and prolonged when required.  Pt averages the following volume levels:  Sustained AH: 82dB  Counting:85dB  Reading (phrases): 82dB  Cognitive Exercise: 74dB Required usual mod verbal cues to ID and correct hoarse voice by improving intent.   01/25/2024: Education provided on evaluation results and SLP's recommendations. Initiated training re: self-advocacy for communication,  asking for time, HEP recommendations. Pt verbalizes agreement with POC, all questions answered to satisfaction.    PATIENT EDUCATION: Education details: Tour Manager Person educated: Patient Education method: Explanation Education comprehension: verbalized understanding  HOME EXERCISE PROGRAM: Speak Out daily   GOALS: Goals reviewed with patient? Yes  SHORT TERM GOALS: Target date: 01/05/2024  Pt will complete warm up exercises from Speak Out! with strong, clear vocal quality, exceeding target dB in all trials over 2 sessions  Baseline: Goal status: ONGOING  2.  Pt will meet or exceed target dB for paragraph level readings over 2 sessions with min-A Baseline:  Goal status: ONGOING  3.  Pt will complete moderately complex discourse level structured language tasks and utilize verbal compensations for anomia in 90% of opportunities  Baseline:  Goal status: ONGOING  4.  Pt will teach back anomia compensations/strategies with mod-I Baseline:  Goal status: ONGOING   LONG TERM GOALS: Target date: 02/02/2024  Pt will verbalize x5 HEP activities for word-finding by d/c Baseline:  Goal status: ONGOING  2.  Pt will report improvement via PROM by at least 2 points by d/c  Baseline:  Goal status: ONGOING  3.  Pt will listen to 5 minute video/audio and verbally summarize key details in 2/2 opportunities  Baseline:  Goal status: ONGOING  4.  Pt will utilize anomia strategies/compensations in unstructured conversation with min-A Baseline:  Goal status: ONGOING  ASSESSMENT:  CLINICAL IMPRESSION: Patient is a 79 y.o. M who was seen today for motor speech and language evaluation in setting of PD and hx of CVA. Pt reports since stroke last year, he has been having trouble with word finding, significantly impacting his confidence in communication across communication partners (see PROM). During today's eval, pt's spoken expression is functional for simple conversation but challenges  evidenced during tasks with increased complexity with pt demonstrating tangential speech without connecting key information to correctly answer questions (e.g., inference questions, asking for similarities). Auditory comprehension breakdowns evidenced with complex syntax. Pt denies cognitive concerns, challenges swallowing. Desires to continue working on voice/speech in setting of PD. Volume is WNL today but voice is very hoarse/rough. Quality improved during sustained ee  and ooh but not with cued intent for ah or reading. I recommend skilled ST to address verbal expression and motor speech.   OBJECTIVE IMPAIRMENTS: Objective impairments include expressive language and dysarthria. These impairments are limiting patient from effectively communicating at home and in community.Factors affecting potential to achieve goals and functional outcome are co-morbidities.. Patient will benefit from skilled SLP services to address above impairments and improve overall function.  REHAB POTENTIAL: Good  PLAN:  SLP FREQUENCY: 2x/week  SLP DURATION: 8 weeks  PLANNED INTERVENTIONS: Cueing hierachy, Internal/external aids, Functional tasks, SLP instruction and feedback, Compensatory strategies, Patient/family education, [phone] Treatment of speech (30 or 45 min) , and [id number]- Speech Eval Sound Prod, [place], Phon, Eval Compre, Express   [employer], M.A., CF-SLP 01/25/2024, 11:02 AM      "

## 2024-01-28 ENCOUNTER — Other Ambulatory Visit: Payer: Self-pay | Admitting: Neurology

## 2024-01-28 ENCOUNTER — Ambulatory Visit

## 2024-01-28 DIAGNOSIS — R4701 Aphasia: Secondary | ICD-10-CM

## 2024-01-28 DIAGNOSIS — R471 Dysarthria and anarthria: Secondary | ICD-10-CM

## 2024-01-28 DIAGNOSIS — R278 Other lack of coordination: Secondary | ICD-10-CM | POA: Diagnosis not present

## 2024-01-28 DIAGNOSIS — G20A1 Parkinson's disease without dyskinesia, without mention of fluctuations: Secondary | ICD-10-CM

## 2024-01-28 NOTE — Therapy (Signed)
 " OUTPATIENT SPEECH LANGUAGE PATHOLOGY PARKINSON'S TREATMENT/DISCHARGE SUMMARY   Patient Name: Joshua [name] MRN: [id number] DOB:[birth year], 79 y.o., male Today's Date: 01/28/2024  PCP: Joshua Gentry, MD REFERRING PROVIDER: Elsie Gentry, MD  END OF SESSION:  End of Session - 01/28/24 1055     Visit Number 7    Number of Visits 17    Date for Recertification  02/02/24    SLP Start Time 1018    SLP Stop Time  1054    SLP Time Calculation (min) 36 min    Activity Tolerance Patient tolerated treatment well               Past Medical History:  Diagnosis Date   Allergic rhinitis    Anxiety    from chronic pain from surgery- on Cymbalta    Arthritis    Asthma    Barrett's esophagus 03/29/2014   Carotid artery disease    Carotid Doppler normal August, 2007   Coronary artery disease    Diverticulosis    Dyslipidemia    GERD (gastroesophageal reflux disease)    History of loop recorder    has since 04/04/15   HTN (hypertension)    takes Metoprolol  for PVC control   Hx of colonic polyps    adenomatous   IFG (impaired fasting glucose)    Myocardial infarction (HCC)    mild   Neuromuscular disorder (HCC)    parkinson's tremors in hands   Palpitations    Benign PVCs   Parkinson disease (HCC)    Pneumonia    Prostate cancer (HCC)    RBBB (right bundle branch block)    rate related   Shingles    Stroke [facility]) 2017   January 01, 2023,right hand weakness,   TIA (transient ischemic attack) 02/2015   Per pt, had 2 strokes   Vertigo    Past Surgical History:  Procedure Laterality Date   BACK SURGERY  2002,2009   x 6   CHOLECYSTECTOMY  11/30/2012   with IOC   COLONOSCOPY     ELECTROPHYSIOLOGIC STUDY N/A 09/26/2015   Procedure: V Tach Ablation (PVC);  Surgeon: Will Gladis Norton, MD;  Location: [facility];  Service: Cardiovascular;  Laterality: N/A;   EP IMPLANTABLE DEVICE N/A 04/04/2015   Procedure: Loop Recorder Insertion;  Surgeon: Will Gladis Norton, MD;  Location: [facility];  Service: Cardiovascular;  Laterality: N/A;   HERNIA REPAIR     laprascopic   KNEE SURGERY     DECEMBER 2017,LEFT KNEE SCOPED   LEFT HEART CATH AND CORONARY ANGIOGRAPHY N/A 02/05/2021   Procedure: LEFT HEART CATH AND CORONARY ANGIOGRAPHY;  Surgeon: Darron Deatrice LABOR, MD;  Location: [facility];  Service: Cardiovascular;  Laterality: N/A;   NECK SURGERY  2002   POLYPECTOMY     ROTATOR CUFF REPAIR Left    TEE WITHOUT CARDIOVERSION N/A 04/04/2015   Procedure: TRANSESOPHAGEAL ECHOCARDIOGRAM (TEE);  Surgeon: Redell GORMAN Shallow, MD;  Location: [facility] ENDOSCOPY;  Service: Cardiovascular;  Laterality: N/A;   TOTAL KNEE ARTHROPLASTY Left 09/28/2022   Procedure: TOTAL KNEE ARTHROPLASTY;  Surgeon: Edna Toribio LABOR, MD;  Location: [facility];  Service: Orthopedics;  Laterality: Left;   TOTAL KNEE ARTHROPLASTY Right 01/11/2023   Procedure: TOTAL KNEE ARTHROPLASTY;  Surgeon: Edna Toribio LABOR, MD;  Location: [facility];  Service: Orthopedics;  Laterality: Right;   TRIGGER FINGER RELEASE Left 12/28/2019   Procedure: RELEASE TRIGGER FINGER/A-1 PULLEY THUMB, MIDDLE AND RING;  Surgeon: Murrell Kuba, MD;  Location: [facility];  Service: Orthopedics;  Laterality: Left;  FAB   UPPER GASTROINTESTINAL ENDOSCOPY     V Tach ablation  09/26/2015   Patient Active Problem List   Diagnosis Date Noted   CVA (cerebral vascular accident) (HCC) 01/15/2023   Primary osteoarthritis of right knee 01/11/2023   Localized osteoarthritis of right knee 01/11/2023   Primary osteoarthritis of left knee 09/28/2022   SBO (small bowel obstruction) (HCC) 05/29/2022   Chest pain 05/26/2022   Epigastric abdominal pain 05/26/2022   Coronary artery disease involving native coronary artery of native heart without angina pectoris 05/28/2021   History of myocardial infarction 02/05/2021   Parkinson's disease (HCC)    Malignant neoplasm of prostate (HCC) 11/01/2017   Essential  tremor 12/06/2015   PVC (premature ventricular contraction) 09/26/2015   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) 04/07/2015   Acute CVA (cerebrovascular accident) (HCC) 03/06/2015   Stroke (cerebrum) (HCC) 03/05/2015   Cerebral infarction (HCC) 03/05/2015   Weakness of right upper extremity    Tremor    Ejection fraction    Vertigo    Sleep apnea    Palpitations    Hyperlipidemia with target LDL less than 70    GERD (gastroesophageal reflux disease)    HTN (hypertension)    Chest pain    RBBB (right bundle branch block)    Ejection fraction    Carotid artery disease (HCC)     ONSET DATE: 11/03/2023 referral   REFERRING DIAG: G20.B1 (ICD-10-CM) - Parkinson's disease with dyskinesia, without mention of fluctuations   THERAPY DIAG:  Dysarthria and anarthria  Aphasia  Rationale for Evaluation and Treatment: Rehabilitation  SUBJECTIVE:   SUBJECTIVE STATEMENT: I get tongue tied and can't get my words out  Pt accompanied by: self  PERTINENT HISTORY: Rt TKA 01/11/23 with subsequent acute Lt CVA affecting Rt side, PD, Anxiety, HTN, HLD, multiple back surgeries/fusion neck and lumbar spine,  NSTEMI, 01/2020, hx of multiple syncopal episodes, CVA in 2017. Known to ST service from prior episodes addressing dysarthria.   PAIN:  Are you having pain? Yes: NPRS scale: 6-7 Pain location: R hand, back, neck Pain description: chronic pain Aggravating factors: stress Relieving factors: sitting quietly  LIVING ENVIRONMENT: Lives with: lives with their spouse, Joshua [name]  Lives in: House/apartment   PATIENT GOALS: to prevent decline, work on word finding     PATIENT REPORTED OUTCOME MEASURES (PROM): (Initial) The Communicative Participation Item Bank        Does your condition interfere with... Pt Rating   ...talking with people you know 1   ...communicating when you need to say something quickly 1   ...talking with people you do not know 0   ...communicating  when you are out in your community 2   ...asking questions in a conversation 1   ....communicating in a small group of people 1   ...having a long conversation 1   ...giving detailed infomrmation 1   ...getting your turn in a fast moving conversation 1   ...trying to persuade a friend or family member to see a different point of view 3  TOTAL 12  3= Not at all; 2=A little; 1=Quite a bit; 0=Very much  TREATMENT DATE:   01/28/24: Pt notes that he feels that his communication has improved since starting back with ST. He states that he does not have as many word-finding difficulties in daily conversation. SLP had pt complete Communication Participation Item Bank (CPIB), scoring 21/30. This is a significant improvement from previous score (12/30). Pt states that he is more confident with his language and speech skills. He notes that he uses the description strategy at home independently when he is having word-finding difficulties. SLP collaborated with pt to discuss accommodations for getting back into choir singing for social engagement. SLP provided pt further HEP focused on SPEAK OUT!, SFA, and VNeST to optimize pt's communication post d/c. Pt is ready for d/c and is agreeable for d/c at this time.   01/25/24: Pt throat cleared >7x during sessions. SLP briefly reviewed throat clearing strategies with pt for optimizing vocal hygiene. Targeted volume and intelligibility using Speak Out! Lesson 10. Pt required occasional verbal cues, modeling, for volume and breath support.   Pt averages the following volume levels:  Sustained AH: 83 dB  Counting:83 dB  Reading (phrases): 77 dB  Cognitive Exercise: 74 dB Required occasional verbal cues, modeling for carryover of intent /volume answering simple questions following structured practice.  To target word finding, Research Scientist (physical Sciences) (VNeST) was utilized. The pt generated 3 subjects and objects for 2 verbs, for a total of 12 subject objects. Pt required occasional min verbal cues. Pt generated 2 complex sentences by answering wh questions. Pt required frequent mod to min verbal cues to generate complex sentences. SLP updated pt's HEP to include VNeST exercises for maximizing communication skills. Plan is to continue VNeST and SPEAK OUT! Program.   01/21/24: Targeted volume and intelligibility using Speak Out! Lesson 4. Pt required occasional verbal cues, modeling, for volume and breath support.   Pt averages the following volume levels:  Sustained AH: 86 dB  Counting: 85 dB  Reading (phrases): 79 dB  Cognitive Exercise: 74 dB Required occasional verbal cues, modeling for carryover of intent /volume answering simple questions following structured practice. SLP targeted naming and anomia compensation of description through [employer] Analysis (SFA). SLP provided education on use of SFA as rehabilitation tool to improve lexical representation with improved word finding, use as structure to aid in description in conversation, and as self-cueing strategy. Given visual target, pt able to name 3/3 on initial attempt. Pt able to generate average of 6/6 salient features given min visual cues for description categories. Without visual cues for generating descriptions, pt averaged 5-6 salient features with given picture. Pt benefited from occasional min verbal cues for generating personal connections to object. Plan is to continue addressing communication support strategies for anomia and continue SPEAK OUT! Program.    01/10/24: Pt was late for session.  Targeted volume and intelligibility using Speak Out! Lesson 3. Pt required occasional min verbal cues, modeling, for volume and breath support.   Pt averages the following volume levels:  Sustained AH: 85 dB  Counting: 82 dB  Reading (phrases): 78  dB  Cognitive Exercise: 72 dB Required occasional min verbal cues, modeling for carryover of intent /volume answering simple questions following structured practice. SLP introduced Primary School Teacher (SFA) as an anomic strategy for pt's communication. SLP guided pt though utilizing SFA during structured exercises involving describing objects. Pt successfully described 2 objects using 6+ descriptions given occasional min A. SLP provided pt HEP involving SFA for continued practice. Plan is to continue SPEAK OUT! Program  and anomia strategies.   01/05/24:  Targeted volume and intelligibility using Speak Out! Lesson 2. Pt required rare min verbal cues, modeling, for volume and breath support.   Pt averages the following volume levels:  Sustained AH: 86 dB  Counting: 85 dB  Reading (phrases): 80 dB  Cognitive Exercise: 78 dB Required rare min verbal cues, modeling for carryover of intent /volume answering simple questions following structured practice. SLP collaborated with pt about addressing accommodations for returning to his choir for singing. Pt notes that he continues to have difficulties with word-finding since his stroke. SLP educated pt and pt's wife about utilizing descriptions during moments of communication breakdown due to anomia. SLP instructed pt in singing with intent by having pt perform sustained phonation exercises. Pt achieved clear voicing for speech production in 85% of opportunities with sustained phonation exercises given occasional min A. Plan is to continue addressing communication support strategies for anomia and continue SPEAK OUT! Program.    01/03/24: Joshua [name] enters with hoarse rough voice. Generated strategies to support processing, including self advocating, using hand or index finger up to indicate you need a minute to think or to get a turn in conversation, limiting background noise and distractions, educate others that you need more time to respond - See Patient  Instructions.  Targeted voice quality intelligibility using Speak Out! Lesson E :Library,  A Year of Intent workbook, Week of December 1. Pt required usual  mod verbal cues, modeling, to carry over intent and to complete each exercise accurately, slow and prolonged when required.  Pt averages the following volume levels:  Sustained AH: 82dB  Counting:85dB  Reading (phrases): 82dB  Cognitive Exercise: 74dB Required usual mod verbal cues to ID and correct hoarse voice by improving intent.   01/28/2024: Education provided on evaluation results and SLP's recommendations. Initiated training re: self-advocacy for communication, asking for time, HEP recommendations. Pt verbalizes agreement with POC, all questions answered to satisfaction.    PATIENT EDUCATION: Education details: Tour Manager Person educated: Patient Education method: Explanation Education comprehension: verbalized understanding  HOME EXERCISE PROGRAM: Speak Out daily   GOALS: Goals reviewed with patient? Yes  SHORT TERM GOALS: Target date: 01/05/2024  Pt will complete warm up exercises from Speak Out! with strong, clear vocal quality, exceeding target dB in all trials over 2 sessions  Baseline: Goal status: MET  2.  Pt will meet or exceed target dB for paragraph level readings over 2 sessions with min-A Baseline:  Goal status: MET  3.  Pt will complete moderately complex discourse level structured language tasks and utilize verbal compensations for anomia in 90% of opportunities  Baseline:  Goal status: MET  4.  Pt will teach back anomia compensations/strategies with mod-I Baseline:  Goal status: MET   LONG TERM GOALS: Target date: 02/02/2024  Pt will verbalize x5 HEP activities for word-finding by d/c Baseline:  Goal status: DEFERRED  2.  Pt will report improvement via PROM by at least 2 points by d/c  Baseline:  Goal status: MET (From 12 to 21 points/30)  3.  Pt will listen to 5 minute video/audio and  verbally summarize key details in 2/2 opportunities  Baseline:  Goal status: DEFERRED  4.  Pt will utilize anomia strategies/compensations in unstructured conversation with min-A Baseline:  Goal status: MET  ASSESSMENT:  CLINICAL IMPRESSION: Patient is a 79 y.o. M who was seen today for motor speech and language evaluation in setting of PD and hx of CVA. Pt reports since stroke last year,  he has been having trouble with word finding, significantly impacting his confidence in communication across communication partners (see PROM). During today's eval, pt's spoken expression is functional for simple conversation but challenges evidenced during tasks with increased complexity with pt demonstrating tangential speech without connecting key information to correctly answer questions (e.g., inference questions, asking for similarities). Auditory comprehension breakdowns evidenced with complex syntax. Pt denies cognitive concerns, challenges swallowing. Desires to continue working on voice/speech in setting of PD. Volume is WNL today but voice is very hoarse/rough. Quality improved during sustained ee and ooh but not with cued intent for ah or reading. I recommend skilled ST to address verbal expression and motor speech.   OBJECTIVE IMPAIRMENTS: Objective impairments include expressive language and dysarthria. These impairments are limiting patient from effectively communicating at home and in community.Factors affecting potential to achieve goals and functional outcome are co-morbidities.. Patient will benefit from skilled SLP services to address above impairments and improve overall function.  REHAB POTENTIAL: Good  PLAN:  SLP FREQUENCY: 2x/week  SLP DURATION: 8 weeks  PLANNED INTERVENTIONS: Cueing hierachy, Internal/external aids, Functional tasks, SLP instruction and feedback, Compensatory strategies, Patient/family education, [phone] Treatment of speech (30 or 45 min) , and [id number]- Speech Eval  Sound Prod, Artic, Phon, Eval Compre, Express    SPEECH THERAPY DISCHARGE SUMMARY   Visits from Start of Care: 7   Current functional level related to goals / functional outcomes: See goals above   Remaining deficits: Mild aphasia and hypokinetic dysarthria.     Education / Equipment: Continued HEP with SPEAK OUT! Program and word-finding strategies (circumlocution).   Patient agrees to discharge. Patient goals were partially met. Patient is being discharged due to meeting pt's personal goals for tx and satisfaction with current communication skills and supports.  Waddell [employer], M.A., CF-SLP 01/28/2024, 10:56 AM      "

## 2024-01-28 NOTE — Patient Instructions (Signed)
 Tips for getting back into choir:    Bluetooth Pedal Page [employer] app for sheet music PDF Scanner app for scanning sheet music to make it digital.  Highlight all your music to support visual tracking.

## 2024-02-03 ENCOUNTER — Other Ambulatory Visit: Payer: Self-pay | Admitting: Neurology

## 2024-02-03 DIAGNOSIS — G20A1 Parkinson's disease without dyskinesia, without mention of fluctuations: Secondary | ICD-10-CM

## 2024-02-04 NOTE — Progress Notes (Signed)
 "  Virtual Visit Via Video       Consent was obtained for video visit:  Yes.   Answered questions that patient had about telehealth interaction:  Yes.   I discussed the limitations, risks, security and privacy concerns of performing an evaluation and management service by telemedicine. I also discussed with the patient that there may be a patient responsible charge related to this service. The patient expressed understanding and agreed to proceed.  Pt location: Home Physician Location: office Name of referring provider:  Loreli Elsie JONETTA Mickey., MD I connected with Joshua [name] at patients initiation/request on 02/08/2024 at 11:15 AM EST by video enabled telemedicine application and verified that I am speaking with the correct person using two identifiers. Pt MRN:  [id number] Pt DOB:  09/26/1945 Video Participants:  Joshua [name];  wife  Assessment/Plan:   1.  Parkinsons Disease, possibly ET/PD  -Continue carbidopa /levodopa  25/100,2 tablets at 7 AM/2 tablets at 10 AM/2 at 1pm/2 at 4pm/2 at 7pm  -Continue carbidopa /levodopa  50/200 at bedtime  - He asks about Crexont  and Rytary , both for ease of dosing as well as for improvement in symptoms.  I am not sure that they will help improvement in symptoms, but certainly would help with ease of dosing.  I discussed with him that I think insurance is going to make him try a COMT inhibitor first and may make him try CR levodopa  in the daytime, although I think that would be detrimental for current symptoms.  He was agreeable to trying entacapone  with first 3 dosages of levodopa  today.  We discussed risk, benefits, and side effects.  He does note that it will change the color of the urine.  He will call me and stop the medication immediately should he have diarrhea.  Discussed risk for confusion.  -Continue requip  1 mg tid.  Patient is markedly improved in terms of decreased rigidity when this medication was added.  Pt doesn't feel that this med  is helping today but this wasn't my experience in the past and I didn't want to change this today given that I found improvement with this in the past and was just seen on video.  Likewise, I really did not want to increase it given age and risks of this medication.  I told him we can readdress this in the future at in person visits.  - Asks about expedite-2 ([employer]) trial for stem cells and discussed in detail.  Discussed that closest site is [place], [place] .  Discussed that he can get more information on [email or web address].  -Patient had skin biopsy done for alpha-synuclein in January, 2024 which did demonstrate evidence of alpha-synuclein in all 3 sites.    -discussed trekking poles  -discussed larger/weighted utensils and he was shown those today    2.  History of watershed infarct.  Years later, the patient had another infarct in the left precentral/middle frontal gyrus in December, 2025.  -First infarct was watershed infarct and had a [employer] recorder, but had it explanted after it never demonstrated any issues  -Second infarct in December, 2025 occurred during knee replacement surgery and patient was off of the Plavix  for the surgery.  - Patient on Plavix , 75 mg daily.  -Patient's LDL was only 43  3.  History of multiple syncopal episodes, likely due to [facility]  -Off of propranolol  and no further syncope.  cardiology has added metoprolol  ever since his NSTEMI.    -continue midodrine  5 mg bid.  4.  Low back pain  -Has had surgical interventions in the past.  Has followed with Dr. Letha and Dr. Onetha  5.  NSTEMI, 01/2020  -He was started on metoprolol  at that time, although his Imdur  was stopped because of hypotension.  6.  Constipation  -Patient is following with gastroenterology, Dr. Abran.  He also has gastroenterology at [facility] because of his history of Barrett's esophagus, now status post endoscopic ablation.  7.  Carpal tunnel syndrome  - Status post release with  Dr. Shari, April, 2025  Subjective:   Joshua [name] was seen today in follow up.  Pt with daughter who supplements hx. patient remains on levodopa  and ropinirole  for Parkinson's disease.  Has had no hallucinations.  He does well on these medications but he is concerned that they are not working as well as in the past and asks about Crexont /Rytary .  He also does not think that the ropinirole  is helping the rigidity and asks about that.  He asked about stem cell trials.  He is back to taking midodrine  twice per day.  He has had no falls.  He is still on his Plavix  daily, 75 mg.  No melena or hematochezia is noted.  Patient has been to outpatient therapies since our last visit.  Notes are reviewed.  Current prescribed movement disorder medications: Carbidopa /levodopa  25/100,2 tablets at 7 AM/2 tablets at 10 AM/2 at 1pm/2 at 4pm/2 at 7pm  Carbidopa /levodopa  50/200 at bedtime Requip  1 mg tid Midodrine  5 mg bid    PREVIOUS MEDICATIONS: Pramipexole  (just was not helpful for tremor); primidone ; propranolol ; metoprolol   ALLERGIES:   Allergies  Allergen Reactions   Floxin I.V. In Dextrose  5% [Ofloxacin] Other (See Comments)    Lowers BP    Terazosin Other (See Comments)    Lower bp    CURRENT MEDICATIONS:  Outpatient Encounter Medications as of 02/08/2024  Medication Sig   acetaminophen  (TYLENOL ) 325 MG tablet Take 1-2 tablets (325-650 mg total) by mouth every 4 (four) hours as needed for mild pain (pain score 1-3).   albuterol  (VENTOLIN  HFA) 108 (90 Base) MCG/ACT inhaler Inhale 2 puffs into the lungs every 4 (four) hours as needed for wheezing or shortness of breath. (Patient not taking: Reported on 08/16/2023)   amoxicillin (AMOXIL) 500 MG tablet Take 500 mg by mouth as needed. For dental visits. (Patient not taking: Reported on 08/16/2023)   azelastine  (OPTIVAR ) 0.05 % ophthalmic solution Place 1 drop into both eyes 2 (two) times daily.   Biotin 89999 MCG TABS Take 1,000 mcg by mouth in  the morning.   carbidopa -levodopa  (SINEMET  CR) 50-200 MG tablet TAKE 1 TABLET BY MOUTH EVERYDAY AT BEDTIME   carbidopa -levodopa  (SINEMET  IR) 25-100 MG tablet 2 AT 7 AM, 2 AT 10 AM,2 AT 1PM,2 AT 4 PM, 2 AT 7 PM   cholecalciferol  (VITAMIN D3) 25 MCG (1000 UNIT) tablet Take 1,000 Units by mouth daily.   clopidogrel  (PLAVIX ) 75 MG tablet Take 1 tablet (75 mg total) by mouth daily.   cyanocobalamin (VITAMIN B12) 1000 MCG tablet Take 1,000 mcg by mouth daily.   docusate sodium  (COLACE) 100 MG capsule Take 2 capsules (200 mg total) by mouth 2 (two) times daily as needed for mild constipation.   DYMISTA  137-50 MCG/ACT SUSP Place 1 puff into both nostrils at bedtime.    escitalopram  (LEXAPRO ) 20 MG tablet Take 20 mg by mouth daily.   finasteride  (PROSCAR ) 5 MG tablet Take 5 mg by mouth every evening.   HYDROcodone-acetaminophen  (NORCO/VICODIN)  5-325 MG tablet Take 1 tablet by mouth every 8 (eight) hours as needed.   lansoprazole  (PREVACID ) 30 MG capsule Take 30 mg by mouth 2 (two) times daily. Morning & mid-afternoon   levocetirizine (XYZAL ) 5 MG tablet Take 5 mg by mouth every evening.     Menthol , Topical Analgesic, (BIOFREEZE EX) Apply 1 Application topically daily as needed (pain).   methocarbamol  (ROBAXIN ) 500 MG tablet Take 500 mg by mouth every 6 (six) hours as needed.   metoprolol  succinate (TOPROL -XL) 25 MG 24 hr tablet TAKE 1/2 TABLET BY MOUTH DAILY   mexiletine (MEXITIL ) 150 MG capsule TAKE 1 CAPSULE BY MOUTH TWICE A DAY   midodrine  (PROAMATINE ) 5 MG tablet TAKE 1 TABLET BY MOUTH 2 TIMES DAILY AS NEEDED. TAKE IF SYSTOLIC BLOOD PRESSURE GETS BELOW 100   montelukast  (SINGULAIR ) 10 MG tablet Take 10 mg by mouth at bedtime.     nitroGLYCERIN  (NITROSTAT ) 0.4 MG SL tablet Place 1 tablet (0.4 mg total) under the tongue every 5 (five) minutes as needed for chest pain (Do not give more than 3 SL tablets in 15 minutes.). (Patient not taking: Reported on 08/16/2023)   Omega-3 Fatty Acids  (FISH OIL) 1000 MG  CAPS Take 1,000 mg by mouth in the morning.   polyethylene glycol (MIRALAX ) 17 g packet Take 17 g by mouth daily.   PROCTOSOL HC 2.5 % rectal cream APPLY INTO AND AROUND RECTUM 2 TIMES A DAY   rOPINIRole  (REQUIP ) 1 MG tablet TAKE 1 TABLET BY MOUTH THREE TIMES A DAY   rosuvastatin  (CRESTOR ) 20 MG tablet Take 1 tablet (20 mg total) by mouth at bedtime.   tamsulosin  (FLOMAX ) 0.4 MG CAPS capsule Take 0.4 mg by mouth daily after supper.    traMADol  (ULTRAM ) 50 MG tablet Take 50 mg by mouth every 8 (eight) hours as needed for moderate pain (pain score 4-6). (Patient not taking: Reported on 08/16/2023)   zolpidem  (AMBIEN ) 10 MG tablet Take 10 mg by mouth at bedtime.   No facility-administered encounter medications on file as of 02/08/2024.    Objective:   PHYSICAL EXAMINATION:    VITALS:   There were no vitals filed for this visit.   GEN:  The patient appears stated age and is in NAD. HEENT:  Normocephalic, atraumatic.    Neurological examination:  Orientation: The patient is alert and oriented x3. Cranial nerves: There is good facial symmetry with facial hypomimia. The speech is fluent and clear.    I have reviewed and interpreted the following labs independently    Chemistry      Component Value Date/Time   NA 136 01/25/2023 0455   NA 136 02/14/2021 1528   K 4.2 01/25/2023 0455   CL 105 01/25/2023 0455   CO2 23 01/25/2023 0455   BUN 16 01/25/2023 0455   BUN 20 02/14/2021 1528   CREATININE 0.93 01/25/2023 0455   CREATININE 0.88 09/18/2015 1135      Component Value Date/Time   CALCIUM  9.0 01/25/2023 0455   ALKPHOS 51 01/16/2023 0612   AST 16 01/16/2023 0612   ALT <5 01/16/2023 0612   BILITOT 1.7 (H) 01/16/2023 0612   BILITOT 0.7 04/04/2015 1501       Lab Results  Component Value Date   WBC 8.0 01/25/2023   HGB 11.6 (L) 01/25/2023   HCT 35.2 (L) 01/25/2023   MCV 92.4 01/25/2023   PLT 352 01/25/2023    Lab Results  Component Value Date   TSH 0.893 05/26/2022  Follow up Instructions      -I discussed the assessment and treatment plan with the patient. The patient was provided an opportunity to ask questions and all were answered. The patient agreed with the plan and demonstrated an understanding of the instructions.   The patient was advised to call back or seek an in-person evaluation if the symptoms worsen or if the condition fails to improve as anticipated.    Total time spent on today's visit was 71 minutes, including both face-to-face time and nonface-to-face time.  Time included that spent on review of records (prior notes available to me/labs/imaging if pertinent), discussing treatment and goals, answering patient's questions and coordinating care.   Asberry Schneider, DO   Cc:  Loreli Elsie JONETTA Mickey., MD "

## 2024-02-08 ENCOUNTER — Telehealth: Admitting: Neurology

## 2024-02-08 DIAGNOSIS — G903 Multi-system degeneration of the autonomic nervous system: Secondary | ICD-10-CM

## 2024-02-08 DIAGNOSIS — I6389 Other cerebral infarction: Secondary | ICD-10-CM

## 2024-02-08 DIAGNOSIS — G20A1 Parkinson's disease without dyskinesia, without mention of fluctuations: Secondary | ICD-10-CM

## 2024-02-08 MED ORDER — ENTACAPONE 200 MG PO TABS: 200.0000 mg | ORAL_TABLET | Freq: Three times a day (TID) | ORAL | 1 refills | Status: AC

## 2024-02-08 NOTE — Patient Instructions (Addendum)
" °  VISIT SUMMARY: Today, we discussed your ongoing symptoms related to Parkinson's disease, including tremor, rigidity, and stiffness, as well as your current medication regimen. We also reviewed your treatment for neurogenic orthostatic hypotension and secondary stroke prevention.  YOUR PLAN: -PARKINSON'S DISEASE: Parkinson's disease is a progressive nervous system disorder that affects movement. We discussed alternative therapies and clinical trial participation to better manage your symptoms. We will trial entacapone  with your first three daily doses of levodopa  and consider newer formulations of carbidopa -levodopa  (Rytary , Crexont ) based on insurance requirements. Additionally, we provided information on the [employer] Expedite 2 stem cell trial and how to self-enroll.  -NEUROGENIC ORTHOSTATIC HYPOTENSION: Neurogenic orthostatic hypotension is a condition where your blood pressure drops significantly when you stand up, causing dizziness or fainting. You will continue taking midodrine  twice daily. If you have any issues or questions about this medication, please send a message.  -HISTORY OF WATERSHED INFARCT WITH ANTIPLATELET THERAPY: A watershed infarct is a type of stroke that occurs in the areas of the brain that receive blood supply from the smallest arteries. You will continue taking clopidogrel  75 mg daily for secondary stroke prevention, and you have an adequate supply with no issues obtaining refills.  INSTRUCTIONS: Please follow the instructions provided for self-enrollment in the [employer] Expedite 2 stem cell trial. Continue taking your medications as prescribed. If you experience any issues or have questions about your medications, please send a message.  The information regarding the clinical trial is found on neckroller.se.  The name of the trial is: A Study to Investigate the Efficacy and Safety of Bemdaneprocel in Adults Who Have Parkinson's Disease (exPDite-2) ClinicalTrials.gov  ID [id number] Sponsor [employer]     Contains text generated by [employer].   "

## 2024-07-04 ENCOUNTER — Ambulatory Visit: Admitting: Physical Therapy

## 2024-07-04 ENCOUNTER — Ambulatory Visit: Admitting: Occupational Therapy

## 2024-08-10 ENCOUNTER — Ambulatory Visit: Payer: Self-pay | Admitting: Neurology
